# Patient Record
Sex: Female | Born: 1950 | Race: White | Hispanic: No | Marital: Married | Smoking: Former smoker
Health system: Southern US, Community
[De-identification: ages and names within clinical notes are randomized; demographics above are authoritative.]

## PROBLEM LIST (undated history)

## (undated) DIAGNOSIS — K222 Esophageal obstruction: Secondary | ICD-10-CM

## (undated) DIAGNOSIS — I4891 Unspecified atrial fibrillation: Secondary | ICD-10-CM

## (undated) DIAGNOSIS — I2510 Atherosclerotic heart disease of native coronary artery without angina pectoris: Secondary | ICD-10-CM

## (undated) DIAGNOSIS — J45909 Unspecified asthma, uncomplicated: Secondary | ICD-10-CM

## (undated) DIAGNOSIS — F419 Anxiety disorder, unspecified: Secondary | ICD-10-CM

## (undated) DIAGNOSIS — I350 Nonrheumatic aortic (valve) stenosis: Secondary | ICD-10-CM

## (undated) DIAGNOSIS — I5032 Chronic diastolic (congestive) heart failure: Secondary | ICD-10-CM

## (undated) DIAGNOSIS — K219 Gastro-esophageal reflux disease without esophagitis: Secondary | ICD-10-CM

## (undated) DIAGNOSIS — D3A029 Benign carcinoid tumor of the large intestine, unspecified portion: Secondary | ICD-10-CM

## (undated) DIAGNOSIS — E785 Hyperlipidemia, unspecified: Secondary | ICD-10-CM

## (undated) DIAGNOSIS — I5033 Acute on chronic diastolic (congestive) heart failure: Secondary | ICD-10-CM

## (undated) DIAGNOSIS — N62 Hypertrophy of breast: Secondary | ICD-10-CM

## (undated) DIAGNOSIS — G2581 Restless legs syndrome: Secondary | ICD-10-CM

## (undated) DIAGNOSIS — R600 Localized edema: Secondary | ICD-10-CM

## (undated) DIAGNOSIS — Z9889 Other specified postprocedural states: Secondary | ICD-10-CM

## (undated) HISTORY — PX: CORONARY ARTERY BYPASS GRAFT: SHX141

## (undated) HISTORY — DX: Anxiety disorder, unspecified: F41.9

## (undated) HISTORY — DX: Nonrheumatic aortic (valve) stenosis: I35.0

## (undated) HISTORY — DX: Atherosclerotic heart disease of native coronary artery without angina pectoris: I25.10

## (undated) HISTORY — DX: Hypertrophy of breast: N62

## (undated) HISTORY — DX: Hyperlipidemia, unspecified: E78.5

## (undated) HISTORY — DX: Benign carcinoid tumor of the large intestine, unspecified portion: D3A.029

## (undated) HISTORY — DX: Esophageal obstruction: K22.2

## (undated) HISTORY — PX: HERNIA REPAIR: SHX51

## (undated) HISTORY — DX: Gastro-esophageal reflux disease without esophagitis: K21.9

## (undated) HISTORY — DX: Other specified postprocedural states: Z98.890

## (undated) HISTORY — PX: ABDOMINAL HYSTERECTOMY: SHX81

## (undated) HISTORY — DX: Chronic diastolic (congestive) heart failure: I50.32

## (undated) HISTORY — DX: Restless legs syndrome: G25.81

## (undated) HISTORY — PX: OTHER SURGICAL HISTORY: SHX169

## (undated) HISTORY — PX: BACK SURGERY: SHX140

## (undated) HISTORY — DX: Unspecified atrial fibrillation: I48.91

## (undated) HISTORY — PX: BREAST REDUCTION SURGERY: SHX8

## (undated) HISTORY — PX: FOOT SURGERY: SHX648

---

## 1999-03-02 ENCOUNTER — Encounter: Payer: Self-pay | Admitting: Neurosurgery

## 1999-03-02 ENCOUNTER — Ambulatory Visit (HOSPITAL_COMMUNITY): Admission: RE | Admit: 1999-03-02 | Discharge: 1999-03-02 | Payer: Self-pay | Admitting: Neurosurgery

## 2001-07-02 ENCOUNTER — Ambulatory Visit (HOSPITAL_COMMUNITY): Admission: RE | Admit: 2001-07-02 | Discharge: 2001-07-02 | Payer: Self-pay | Admitting: Internal Medicine

## 2001-07-08 ENCOUNTER — Ambulatory Visit (HOSPITAL_COMMUNITY): Admission: RE | Admit: 2001-07-08 | Discharge: 2001-07-08 | Payer: Self-pay | Admitting: Neurosurgery

## 2001-07-08 ENCOUNTER — Encounter: Payer: Self-pay | Admitting: Neurosurgery

## 2001-10-20 DIAGNOSIS — Z9889 Other specified postprocedural states: Secondary | ICD-10-CM

## 2001-10-20 HISTORY — DX: Other specified postprocedural states: Z98.890

## 2002-04-18 ENCOUNTER — Ambulatory Visit (HOSPITAL_COMMUNITY): Admission: RE | Admit: 2002-04-18 | Discharge: 2002-04-18 | Payer: Self-pay | Admitting: Internal Medicine

## 2004-10-07 ENCOUNTER — Ambulatory Visit: Payer: Self-pay | Admitting: Internal Medicine

## 2005-10-20 DIAGNOSIS — D3A029 Benign carcinoid tumor of the large intestine, unspecified portion: Secondary | ICD-10-CM

## 2005-10-20 HISTORY — PX: AORTIC VALVE REPLACEMENT: SHX41

## 2005-10-20 HISTORY — PX: APPENDECTOMY: SHX54

## 2005-10-20 HISTORY — DX: Benign carcinoid tumor of the large intestine, unspecified portion: D3A.029

## 2005-10-20 HISTORY — PX: LAPAROTOMY: SHX154

## 2005-11-20 ENCOUNTER — Ambulatory Visit: Payer: Self-pay | Admitting: Internal Medicine

## 2006-01-18 HISTORY — PX: COLON SURGERY: SHX602

## 2006-04-23 ENCOUNTER — Ambulatory Visit: Payer: Self-pay | Admitting: Cardiology

## 2006-05-20 ENCOUNTER — Ambulatory Visit: Payer: Self-pay | Admitting: Cardiology

## 2006-05-29 ENCOUNTER — Ambulatory Visit: Payer: Self-pay | Admitting: Cardiology

## 2006-05-29 ENCOUNTER — Inpatient Hospital Stay (HOSPITAL_BASED_OUTPATIENT_CLINIC_OR_DEPARTMENT_OTHER): Admission: RE | Admit: 2006-05-29 | Discharge: 2006-05-29 | Payer: Self-pay | Admitting: Cardiology

## 2006-06-12 ENCOUNTER — Ambulatory Visit: Payer: Self-pay | Admitting: Cardiology

## 2006-06-26 ENCOUNTER — Encounter: Admission: RE | Admit: 2006-06-26 | Discharge: 2006-06-26 | Payer: Self-pay | Admitting: Surgery

## 2006-06-29 ENCOUNTER — Encounter: Admission: RE | Admit: 2006-06-29 | Discharge: 2006-06-29 | Payer: Self-pay | Admitting: Dentistry

## 2006-06-29 ENCOUNTER — Ambulatory Visit: Payer: Self-pay | Admitting: Dentistry

## 2006-07-02 ENCOUNTER — Ambulatory Visit (HOSPITAL_COMMUNITY): Admission: RE | Admit: 2006-07-02 | Discharge: 2006-07-02 | Payer: Self-pay | Admitting: Dentistry

## 2006-07-10 ENCOUNTER — Ambulatory Visit: Payer: Self-pay | Admitting: Dentistry

## 2006-07-13 ENCOUNTER — Inpatient Hospital Stay (HOSPITAL_COMMUNITY): Admission: RE | Admit: 2006-07-13 | Discharge: 2006-07-19 | Payer: Self-pay | Admitting: Surgery

## 2006-07-13 ENCOUNTER — Encounter (INDEPENDENT_AMBULATORY_CARE_PROVIDER_SITE_OTHER): Payer: Self-pay | Admitting: Specialist

## 2006-07-21 ENCOUNTER — Ambulatory Visit: Payer: Self-pay | Admitting: Cardiology

## 2006-07-27 ENCOUNTER — Ambulatory Visit: Payer: Self-pay | Admitting: Cardiology

## 2006-08-03 ENCOUNTER — Ambulatory Visit: Payer: Self-pay | Admitting: Cardiology

## 2006-08-12 ENCOUNTER — Ambulatory Visit: Payer: Self-pay | Admitting: Cardiology

## 2006-08-26 ENCOUNTER — Ambulatory Visit: Payer: Self-pay | Admitting: Cardiology

## 2006-08-28 ENCOUNTER — Ambulatory Visit: Payer: Self-pay | Admitting: Cardiology

## 2006-09-04 ENCOUNTER — Ambulatory Visit: Payer: Self-pay | Admitting: Cardiology

## 2006-09-07 ENCOUNTER — Ambulatory Visit: Payer: Self-pay | Admitting: Cardiology

## 2006-09-11 ENCOUNTER — Ambulatory Visit: Payer: Self-pay | Admitting: Cardiology

## 2006-09-21 ENCOUNTER — Ambulatory Visit: Payer: Self-pay | Admitting: Cardiology

## 2006-09-28 ENCOUNTER — Ambulatory Visit: Payer: Self-pay | Admitting: Cardiology

## 2006-10-19 ENCOUNTER — Ambulatory Visit: Payer: Self-pay | Admitting: Cardiology

## 2006-10-29 ENCOUNTER — Ambulatory Visit: Payer: Self-pay | Admitting: Cardiology

## 2006-11-04 ENCOUNTER — Ambulatory Visit: Payer: Self-pay | Admitting: Cardiology

## 2006-11-13 ENCOUNTER — Ambulatory Visit: Payer: Self-pay | Admitting: Cardiology

## 2006-12-02 ENCOUNTER — Ambulatory Visit: Payer: Self-pay | Admitting: Cardiology

## 2006-12-18 ENCOUNTER — Ambulatory Visit: Payer: Self-pay | Admitting: Cardiology

## 2006-12-30 ENCOUNTER — Ambulatory Visit: Payer: Self-pay | Admitting: Cardiology

## 2006-12-31 ENCOUNTER — Ambulatory Visit: Payer: Self-pay | Admitting: Cardiology

## 2007-01-14 ENCOUNTER — Ambulatory Visit: Payer: Self-pay | Admitting: Cardiology

## 2007-01-27 ENCOUNTER — Ambulatory Visit: Payer: Self-pay | Admitting: Cardiology

## 2007-02-10 ENCOUNTER — Ambulatory Visit: Payer: Self-pay | Admitting: Cardiology

## 2007-02-24 ENCOUNTER — Ambulatory Visit: Payer: Self-pay | Admitting: Cardiology

## 2007-03-11 ENCOUNTER — Ambulatory Visit: Payer: Self-pay | Admitting: Physician Assistant

## 2007-03-30 ENCOUNTER — Ambulatory Visit: Payer: Self-pay | Admitting: Cardiology

## 2007-04-12 ENCOUNTER — Ambulatory Visit: Payer: Self-pay | Admitting: Physician Assistant

## 2007-05-11 ENCOUNTER — Ambulatory Visit: Payer: Self-pay | Admitting: Cardiology

## 2007-06-08 ENCOUNTER — Ambulatory Visit: Payer: Self-pay | Admitting: Cardiology

## 2007-06-22 ENCOUNTER — Ambulatory Visit: Payer: Self-pay | Admitting: Cardiology

## 2007-07-20 ENCOUNTER — Ambulatory Visit: Payer: Self-pay | Admitting: Cardiology

## 2007-08-20 ENCOUNTER — Ambulatory Visit: Payer: Self-pay | Admitting: Cardiology

## 2007-09-14 ENCOUNTER — Ambulatory Visit: Payer: Self-pay | Admitting: Cardiology

## 2007-10-20 ENCOUNTER — Ambulatory Visit: Payer: Self-pay | Admitting: Cardiology

## 2007-11-03 ENCOUNTER — Ambulatory Visit: Payer: Self-pay | Admitting: Cardiology

## 2007-11-23 ENCOUNTER — Ambulatory Visit: Payer: Self-pay | Admitting: Cardiology

## 2007-12-15 ENCOUNTER — Ambulatory Visit: Payer: Self-pay | Admitting: Internal Medicine

## 2007-12-29 ENCOUNTER — Ambulatory Visit: Payer: Self-pay | Admitting: Cardiology

## 2008-01-26 ENCOUNTER — Ambulatory Visit: Payer: Self-pay | Admitting: Cardiology

## 2008-02-24 ENCOUNTER — Ambulatory Visit: Payer: Self-pay | Admitting: Cardiology

## 2008-02-29 ENCOUNTER — Ambulatory Visit: Payer: Self-pay | Admitting: Cardiology

## 2008-03-23 ENCOUNTER — Ambulatory Visit: Payer: Self-pay | Admitting: Cardiology

## 2008-04-14 ENCOUNTER — Ambulatory Visit: Payer: Self-pay | Admitting: Cardiology

## 2008-05-09 ENCOUNTER — Ambulatory Visit: Payer: Self-pay | Admitting: Cardiology

## 2008-06-08 ENCOUNTER — Ambulatory Visit: Payer: Self-pay | Admitting: Cardiology

## 2008-06-16 ENCOUNTER — Encounter (INDEPENDENT_AMBULATORY_CARE_PROVIDER_SITE_OTHER): Payer: Self-pay | Admitting: Specialist

## 2008-06-16 ENCOUNTER — Ambulatory Visit (HOSPITAL_COMMUNITY): Admission: RE | Admit: 2008-06-16 | Discharge: 2008-06-17 | Payer: Self-pay | Admitting: Specialist

## 2008-06-23 ENCOUNTER — Ambulatory Visit: Payer: Self-pay | Admitting: Cardiology

## 2008-06-30 ENCOUNTER — Ambulatory Visit: Payer: Self-pay | Admitting: Cardiology

## 2008-07-18 ENCOUNTER — Ambulatory Visit: Payer: Self-pay | Admitting: Cardiology

## 2008-08-08 ENCOUNTER — Ambulatory Visit: Payer: Self-pay

## 2008-09-05 ENCOUNTER — Ambulatory Visit: Payer: Self-pay | Admitting: Cardiology

## 2008-09-26 ENCOUNTER — Ambulatory Visit: Payer: Self-pay | Admitting: Cardiology

## 2008-10-17 ENCOUNTER — Ambulatory Visit: Payer: Self-pay | Admitting: Cardiology

## 2008-12-01 ENCOUNTER — Ambulatory Visit: Payer: Self-pay | Admitting: Cardiology

## 2008-12-26 ENCOUNTER — Ambulatory Visit: Payer: Self-pay | Admitting: Cardiology

## 2009-01-02 ENCOUNTER — Ambulatory Visit: Payer: Self-pay | Admitting: Cardiology

## 2009-01-30 ENCOUNTER — Ambulatory Visit: Payer: Self-pay | Admitting: Cardiology

## 2009-02-07 ENCOUNTER — Encounter: Payer: Self-pay | Admitting: Cardiology

## 2009-02-09 ENCOUNTER — Encounter: Payer: Self-pay | Admitting: Cardiology

## 2009-02-27 ENCOUNTER — Ambulatory Visit: Payer: Self-pay | Admitting: Cardiology

## 2009-03-20 ENCOUNTER — Ambulatory Visit: Payer: Self-pay

## 2009-04-17 ENCOUNTER — Ambulatory Visit: Payer: Self-pay

## 2009-05-15 ENCOUNTER — Ambulatory Visit: Payer: Self-pay | Admitting: Cardiology

## 2009-06-04 ENCOUNTER — Encounter: Payer: Self-pay | Admitting: *Deleted

## 2009-06-04 DIAGNOSIS — I2510 Atherosclerotic heart disease of native coronary artery without angina pectoris: Secondary | ICD-10-CM | POA: Insufficient documentation

## 2009-06-04 DIAGNOSIS — E785 Hyperlipidemia, unspecified: Secondary | ICD-10-CM | POA: Insufficient documentation

## 2009-06-04 DIAGNOSIS — I482 Chronic atrial fibrillation, unspecified: Secondary | ICD-10-CM | POA: Insufficient documentation

## 2009-06-05 ENCOUNTER — Encounter: Payer: Self-pay | Admitting: Cardiology

## 2009-06-05 ENCOUNTER — Ambulatory Visit: Payer: Self-pay | Admitting: Cardiology

## 2009-06-05 DIAGNOSIS — R071 Chest pain on breathing: Secondary | ICD-10-CM | POA: Insufficient documentation

## 2009-06-05 DIAGNOSIS — Z952 Presence of prosthetic heart valve: Secondary | ICD-10-CM | POA: Insufficient documentation

## 2009-06-07 ENCOUNTER — Ambulatory Visit: Payer: Self-pay | Admitting: Cardiology

## 2009-06-07 ENCOUNTER — Encounter: Payer: Self-pay | Admitting: Cardiology

## 2009-06-11 ENCOUNTER — Telehealth: Payer: Self-pay | Admitting: Cardiology

## 2009-07-03 ENCOUNTER — Ambulatory Visit: Payer: Self-pay | Admitting: Cardiology

## 2009-07-03 LAB — CONVERTED CEMR LAB: POC INR: 3.4

## 2009-07-31 ENCOUNTER — Ambulatory Visit: Payer: Self-pay | Admitting: Cardiology

## 2009-08-28 ENCOUNTER — Ambulatory Visit: Payer: Self-pay | Admitting: Cardiology

## 2009-08-29 ENCOUNTER — Encounter: Payer: Self-pay | Admitting: Cardiology

## 2009-09-11 ENCOUNTER — Ambulatory Visit: Payer: Self-pay | Admitting: Cardiology

## 2009-09-11 LAB — CONVERTED CEMR LAB: POC INR: 2.6

## 2009-10-02 ENCOUNTER — Ambulatory Visit: Payer: Self-pay | Admitting: Cardiology

## 2009-10-02 LAB — CONVERTED CEMR LAB: POC INR: 2.1

## 2009-11-09 ENCOUNTER — Ambulatory Visit: Payer: Self-pay | Admitting: Cardiology

## 2009-11-09 LAB — CONVERTED CEMR LAB: POC INR: 2.6

## 2009-11-27 ENCOUNTER — Ambulatory Visit: Payer: Self-pay | Admitting: Cardiology

## 2009-11-27 LAB — CONVERTED CEMR LAB: POC INR: 2.7

## 2009-12-25 ENCOUNTER — Ambulatory Visit: Payer: Self-pay | Admitting: Cardiology

## 2009-12-25 LAB — CONVERTED CEMR LAB: POC INR: 2.5

## 2010-01-22 ENCOUNTER — Ambulatory Visit: Payer: Self-pay | Admitting: Cardiology

## 2010-01-22 LAB — CONVERTED CEMR LAB: POC INR: 2.6

## 2010-02-26 ENCOUNTER — Ambulatory Visit: Payer: Self-pay | Admitting: Cardiology

## 2010-03-26 ENCOUNTER — Ambulatory Visit: Payer: Self-pay | Admitting: Cardiology

## 2010-04-23 ENCOUNTER — Ambulatory Visit: Payer: Self-pay | Admitting: Cardiology

## 2010-04-23 LAB — CONVERTED CEMR LAB: POC INR: 3.1

## 2010-05-21 ENCOUNTER — Ambulatory Visit: Payer: Self-pay | Admitting: Cardiology

## 2010-06-18 ENCOUNTER — Ambulatory Visit: Payer: Self-pay | Admitting: Cardiology

## 2010-07-23 ENCOUNTER — Ambulatory Visit: Payer: Self-pay | Admitting: Cardiology

## 2010-07-23 LAB — CONVERTED CEMR LAB: POC INR: 2.3

## 2010-08-20 ENCOUNTER — Ambulatory Visit: Payer: Self-pay | Admitting: Cardiology

## 2010-08-20 LAB — CONVERTED CEMR LAB: POC INR: 2.8

## 2010-09-10 ENCOUNTER — Ambulatory Visit: Payer: Self-pay | Admitting: Cardiology

## 2010-09-10 LAB — CONVERTED CEMR LAB: POC INR: 2.6

## 2010-09-16 ENCOUNTER — Ambulatory Visit: Payer: Self-pay | Admitting: Internal Medicine

## 2010-09-16 DIAGNOSIS — K219 Gastro-esophageal reflux disease without esophagitis: Secondary | ICD-10-CM | POA: Insufficient documentation

## 2010-09-16 DIAGNOSIS — R1319 Other dysphagia: Secondary | ICD-10-CM | POA: Insufficient documentation

## 2010-09-23 ENCOUNTER — Telehealth (INDEPENDENT_AMBULATORY_CARE_PROVIDER_SITE_OTHER): Payer: Self-pay | Admitting: *Deleted

## 2010-09-27 ENCOUNTER — Ambulatory Visit: Payer: Self-pay | Admitting: Cardiology

## 2010-10-03 ENCOUNTER — Ambulatory Visit (HOSPITAL_COMMUNITY)
Admission: RE | Admit: 2010-10-03 | Discharge: 2010-10-03 | Payer: Self-pay | Source: Home / Self Care | Attending: Internal Medicine | Admitting: Internal Medicine

## 2010-10-03 HISTORY — PX: ESOPHAGOGASTRODUODENOSCOPY: SHX1529

## 2010-10-04 DIAGNOSIS — K222 Esophageal obstruction: Secondary | ICD-10-CM | POA: Insufficient documentation

## 2010-10-04 HISTORY — DX: Esophageal obstruction: K22.2

## 2010-10-08 ENCOUNTER — Encounter (INDEPENDENT_AMBULATORY_CARE_PROVIDER_SITE_OTHER): Payer: Self-pay | Admitting: *Deleted

## 2010-10-08 ENCOUNTER — Ambulatory Visit: Payer: Self-pay | Admitting: Cardiology

## 2010-10-08 LAB — CONVERTED CEMR LAB: POC INR: 1.2

## 2010-10-09 ENCOUNTER — Encounter: Payer: Self-pay | Admitting: Internal Medicine

## 2010-10-10 ENCOUNTER — Telehealth (INDEPENDENT_AMBULATORY_CARE_PROVIDER_SITE_OTHER): Payer: Self-pay

## 2010-10-11 ENCOUNTER — Ambulatory Visit: Payer: Self-pay | Admitting: Cardiology

## 2010-10-11 ENCOUNTER — Encounter: Payer: Self-pay | Admitting: Cardiology

## 2010-10-15 ENCOUNTER — Encounter: Payer: Self-pay | Admitting: Cardiology

## 2010-10-15 ENCOUNTER — Ambulatory Visit: Payer: Self-pay | Admitting: Cardiology

## 2010-10-15 ENCOUNTER — Telehealth (INDEPENDENT_AMBULATORY_CARE_PROVIDER_SITE_OTHER): Payer: Self-pay

## 2010-10-15 LAB — CONVERTED CEMR LAB: POC INR: 2.5

## 2010-11-05 ENCOUNTER — Ambulatory Visit: Admission: RE | Admit: 2010-11-05 | Discharge: 2010-11-05 | Payer: Self-pay | Source: Home / Self Care

## 2010-11-19 NOTE — Medication Information (Signed)
Summary: ccr-lr  Anticoagulant Therapy  Managed by: Vashti Hey, RN Referring MD: Lewayne Bunting PCP: Samuel Jester Supervising MD: Andee Lineman MD, Michelle Piper Indication 1: Atrial Fibrillation (ICD-427.31) Indication 2: Aortic Valve Replacement (ICD-V43.3) [facility] [place] Site: [facility] INR POC 2.3  Dietary changes: no    Health status changes: no    Bleeding/hemorrhagic complications: no    Recent/future hospitalizations: no    Any changes in medication regimen? no    Recent/future dental: no  Any missed doses?: no       Is patient compliant with meds? yes       Allergies: 1)  ! * Anesthia 2)  Pcn  Anticoagulation Management History:      The patient is taking warfarin and comes in today for a routine follow up visit.  Negative risk factors for bleeding include an age less than 60 years old.  The bleeding index is 'low risk'.  Negative CHADS2 values include Age > 64 years old.  The start date was 06/20/2006.  Anticoagulation responsible provider: Andee Lineman MD, Michelle Piper.  INR POC: 2.3.  Cuvette Lot#: [id number].  Exp: 10/11.    Anticoagulation Management Assessment/Plan:      The patient's current anticoagulation dose is Warfarin sodium 2 mg tabs: Use as directed by Anticoagualtion Clinic.  The target INR is 2 - 3.  The next INR is due 07/23/2010.  Anticoagulation instructions were given to patient.  Results were reviewed/authorized by Vashti Hey, RN.  She was notified by Vashti Hey RN.         Prior Anticoagulation Instructions: INR 3.1 Decrease coumadin to 2mg  once daily except 4mg  on M,W,F  Current Anticoagulation Instructions: INR 2.3 Continue coumadin 2mg  once daily except 4mg  on M,W,F

## 2010-11-19 NOTE — Medication Information (Signed)
Summary: ccr-lr  Anticoagulant Therapy  Managed by: Vashti Hey, RN Referring MD: Lewayne Bunting PCP: Samuel Jester Supervising MD: Andee Lineman MD, Michelle Piper Indication 1: Atrial Fibrillation (ICD-427.31) Indication 2: Aortic Valve Replacement (ICD-V43.3) [facility] [place] Site: [facility] INR POC 2.8  Dietary changes: no    Health status changes: no    Bleeding/hemorrhagic complications: no    Recent/future hospitalizations: no    Any changes in medication regimen? no    Recent/future dental: no  Any missed doses?: no       Is patient compliant with meds? yes       Allergies: 1)  ! * Anesthia 2)  Pcn  Anticoagulation Management History:      The patient is taking warfarin and comes in today for a routine follow up visit.  Negative risk factors for bleeding include an age less than 71 years old.  The bleeding index is 'low risk'.  Negative CHADS2 values include Age > 15 years old.  The start date was 06/20/2006.  Anticoagulation responsible provider: Andee Lineman MD, Michelle Piper.  INR POC: 2.8.  Cuvette Lot#: [id number].  Exp: 10/11.    Anticoagulation Management Assessment/Plan:      The patient's current anticoagulation dose is Warfarin sodium 2 mg tabs: Use as directed by Anticoagualtion Clinic.  The target INR is 2 - 3.  The next INR is due 09/10/2010.  Anticoagulation instructions were given to patient.  Results were reviewed/authorized by Vashti Hey, RN.  She was notified by Vashti Hey RN.         Prior Anticoagulation Instructions: INR 2.3 Continue coumadin 2mg  once daily except 4mg  on M,W,F  Current Anticoagulation Instructions: INR 2.8 Continue coumadin 2mg  once daily except 4mg  on M,W,F

## 2010-11-19 NOTE — Medication Information (Signed)
Summary: ccr-lr  Anticoagulant Therapy  Managed by: Vashti Hey, RN Referring MD: Lewayne Bunting PCP: Vance Hochmuth Jester Supervising MD: Diona Browner MD, Remi Deter Indication 1: Atrial Fibrillation (ICD-427.31) Indication 2: Aortic Valve Replacement (ICD-V43.3) Lab [facility] [place] Site: [facility] INR POC 2.6  Dietary changes: no    Health status changes: no    Bleeding/hemorrhagic complications: no    Recent/future hospitalizations: no    Any changes in medication regimen? no    Recent/future dental: no  Any missed doses?: no       Is patient compliant with meds? yes       Allergies: 1)  ! * Anesthia 2)  Pcn  Anticoagulation Management History:      The patient is taking warfarin and comes in today for a routine follow up visit.  Negative risk factors for bleeding include an age less than 68 years old.  The bleeding index is 'low risk'.  Negative CHADS2 values include Age > 34 years old.  The start date was 06/20/2006.  Anticoagulation responsible provider: Diona Browner MD, Remi Deter.  INR POC: 2.6.  Cuvette Lot#: [id number].  Exp: 10/11.    Anticoagulation Management Assessment/Plan:      The patient's current anticoagulation dose is Warfarin sodium 2 mg tabs: Use as directed by Anticoagualtion Clinic.  The target INR is 2 - 3.  The next INR is due 03/26/2010.  Anticoagulation instructions were given to patient.  Results were reviewed/authorized by Vashti Hey, RN.  She was notified by Vashti Hey RN.         Prior Anticoagulation Instructions: INR 2.6 Continue coumadin 4mg  once daily except 2mg  on M,W,F  Current Anticoagulation Instructions: Same as Prior Instructions.

## 2010-11-19 NOTE — Medication Information (Signed)
Summary: ccr-lr  Anticoagulant Therapy  Managed by: Vashti Hey, RN Referring MD: Lewayne Bunting PCP: Samuel Jester Supervising MD: Antoine Poche MD, Fayrene Fearing Indication 1: Atrial Fibrillation (ICD-427.31) Indication 2: Aortic Valve Replacement (ICD-V43.3) Lab [facility] [place] Site: [facility] INR POC 2.6  Dietary changes: no    Health status changes: no    Bleeding/hemorrhagic complications: no    Recent/future hospitalizations: no    Any changes in medication regimen? no    Recent/future dental: no  Any missed doses?: no       Is patient compliant with meds? yes       Allergies: 1)  ! * Anesthia 2)  Pcn  Anticoagulation Management History:      The patient is taking warfarin and comes in today for a routine follow up visit.  Negative risk factors for bleeding include an age less than 85 years old.  The bleeding index is 'low risk'.  Negative CHADS2 values include Age > 1 years old.  The start date was 06/20/2006.  Anticoagulation responsible provider: Antoine Poche MD, Fayrene Fearing.  INR POC: 2.6.  Cuvette Lot#: [id number].  Exp: 10/11.    Anticoagulation Management Assessment/Plan:      The patient's current anticoagulation dose is Warfarin sodium 2 mg tabs: Use as directed by Antico[facility]ualtion Clinic.  The target INR is 2 - 3.  The next INR is due 02/19/2010.  Anticoagulation instructions were given to patient.  Results were reviewed/authorized by Vashti Hey, RN.  She was notified by Vashti Hey RN.         Prior Anticoagulation Instructions: INR 2.5 Continue coumadin 4mg  once daily except 2mg  on M,W,F  Current Anticoagulation Instructions: INR 2.6 Continue coumadin 4mg  once daily except 2mg  on M,W,F

## 2010-11-19 NOTE — Medication Information (Signed)
Summary: ccr-lr  Anticoagulant Therapy  Managed by: Vashti Hey, RN Referring MD: Lewayne Bunting PCP: Samuel Jester Supervising MD: Myrtis Ser MD, Tinnie Gens Indication 1: Atrial Fibrillation (ICD-427.31) Indication 2: Aortic Valve Replacement (ICD-V43.3) Lab [facility] [place] Site: [facility] INR POC 2.3  Dietary changes: no    Health status changes: yes       Details: sinus infection  Bleeding/hemorrhagic complications: no    Recent/future hospitalizations: no    Any changes in medication regimen? yes       Details: Amoxicillin tid x 7 days  Recent/future dental: no  Any missed doses?: no       Is patient compliant with meds? yes       Allergies: 1)  ! * Anesthia 2)  Pcn  Anticoagulation Management History:      The patient is taking warfarin and comes in today for a routine follow up visit.  Negative risk factors for bleeding include an age less than 62 years old.  The bleeding index is 'low risk'.  Negative CHADS2 values include Age > 41 years old.  The start date was 06/20/2006.  Anticoagulation responsible provider: Myrtis Ser MD, Tinnie Gens.  INR POC: 2.3.  Cuvette Lot#: [id number].  Exp: 10/11.    Anticoagulation Management Assessment/Plan:      The patient's current anticoagulation dose is Warfarin sodium 2 mg tabs: Use as directed by Anticoagualtion Clinic.  The target INR is 2 - 3.  The next INR is due 08/20/2010.  Anticoagulation instructions were given to patient.  Results were reviewed/authorized by Vashti Hey, RN.  She was notified by Vashti Hey RN.         Prior Anticoagulation Instructions: INR 2.3 Continue coumadin 2mg  once daily except 4mg  on M,W,F  Current Anticoagulation Instructions: Same as Prior Instructions.

## 2010-11-19 NOTE — Medication Information (Signed)
Summary: CCR-LR  Anticoagulant Therapy  Managed by: Amy Hey, [name] Referring MD: Lewayne Bunting PCP: Samuel Jester Supervising MD: Antoine Poche MD, Fayrene Fearing Indication 1: Atrial Fibrillation (ICD-427.31) Indication 2: Aortic Valve Replacement (ICD-V43.3) Lab [facility] [place] Site: [facility] INR POC 3.1  Dietary changes: no    Health status changes: no    Bleeding/hemorrhagic complications: no    Recent/future hospitalizations: no    Any changes in medication regimen? no    Recent/future dental: no  Any missed doses?: no       Is patient compliant with meds? yes       Allergies: 1)  ! * Anesthia 2)  Pcn  Anticoagulation Management History:      The patient is taking warfarin and comes in today for a routine follow up visit.  Negative risk factors for bleeding include an age less than 14 years old.  The bleeding index is 'low risk'.  Negative CHADS2 values include Age > 59 years old.  The start date was 06/20/2006.  Anticoagulation responsible provider: Antoine Poche MD, Fayrene Fearing.  INR POC: 3.1.  Cuvette Lot#: [id number].  Exp: 10/11.    Anticoagulation Management Assessment/Plan:      The patient's current anticoagulation dose is Warfarin sodium 2 mg tabs: Use as directed by Anticoagualtion Clinic.  The target INR is 2 - 3.  The next INR is due 05/21/2010.  Anticoagulation instructions were given to patient.  Results were reviewed/authorized by Amy Hey, [name].  She was notified by Amy Hey [name].         Prior Anticoagulation Instructions: INR 2.1 Continue coumadin 4mg  once daily except 2mg  on M,W,F  Current Anticoagulation Instructions: INR 3.1 Hold coumadin tonight then resume 4mg  once daily except 2mg  on M,W,F Increase greens

## 2010-11-19 NOTE — Assessment & Plan Note (Signed)
Summary: 6 MO FU PER FEB REMINDER-SRS      Allergies Added:   Visit Type:  Follow-up Primary Provider:  Samuel Jester  CC:  follow-up visit.  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 Patient denies any chest pain. She has no shortness of breath orthopnea PND. She had an echocardiogram done in August of 2010 demonstrated normal LV function ejection fraction measured at 60-65%. There was slight dilatation of the right ventricle. [employer]'s aortic valve appeared to be functioning normal. The left atrium is mild moderately dilated. Of note is also the patient status post coronary bypass grafting but reports no chest pain. The patient also has a history of atrial fibrillation without recurrence. She denies any palpitations presyncope or syncope.  Clinical Review Panels:  CXR CXR results  Clinical Data: Preadmission respiratory film.   CHEST - 2 VIEW   Comparison: PA and lateral chest 07/16/2006.    Findings: Lungs clear.  No effusion.  Heart size normal.  The   patient  is status post aortic valve replacement.    IMPRESSION:   No acute disease.    Read By:  Charyl Dancer,  M.D. (06/09/2008)    Preventive Screening-Counseling & Management  Alcohol-Tobacco     Smoking Status: quit     Year Quit: 1996  Current Medications (verified): 1)  Alprazolam 1 Mg Tabs (Alprazolam) .Marland Kitchen.. 1 1/2 Once Daily 2)  Crestor 10 Mg Tabs (Rosuvastatin Calcium) .... Take One Tablet By Mouth Daily. 3)  Requip 2 Mg Tabs (Ropinirole Hcl) .... Once Daily 4)  Allegra 180 Mg Tabs (Fexofenadine Hcl) .... Once Daily 5)  Warfarin Sodium 2 Mg Tabs (Warfarin Sodium) .... Use As Directed By Anticoagualtion Clinic 6)  Metoprolol Succinate 25 Mg Xr24h-Tab (Metoprolol Succinate) .... Take One Tablet By Mouth Daily 7)  Ramipril 2.5 Mg Caps (Ramipril) .... Take One Capsule By Mouth Daily 8)   Prevacid 15 Mg Cpdr (Lansoprazole) .... 2 Tabs Once Daily 9)  Multivitamins   Tabs (Multiple Vitamin) .... Once Daily 10)  Vitamin D 400 Unit  Tabs (Cholecalciferol) .... Once Daily 11)  Temazepam 30 Mg Caps (Temazepam) .... At Bedtime 12)  Hydrocodone-Acetaminophen 7.5-650 Mg Tabs (Hydrocodone-Acetaminophen) .Marland Kitchen.. 1 Tab Every 4-6 Hrs As Needed  Allergies (verified): 1)  ! * Anesthia 2)  Pcn  Comments:  Nurse/Medical Assistant: The patient is currently on medications but does not know the name or dosage at this time. Instructed to contact our office with details. Will update medication list at that time.  Past History:  Past Medical History: Last updated: 06/04/2009 HYPERLIPIDEMIA-MIXED (ICD-272.4) CAD, ARTERY BYPASS GRAFT (ICD-414.04) ATRIAL FIBRILLATION (ICD-427.31) Macromastia  Past Surgical History: Last updated: 06/04/2009 CABG -- 9/07 Valve Replacement-Aortic (S/P) -- 9/07  Family History: Last updated: 06/05/2009 noncontributory  Social History: Last updated: 06/04/2009 Full Time Widowed  Tobacco Use - Former.   Risk Factors: Smoking Status: quit (12/25/2009)  Review of Systems  The patient denies fatigue, malaise, fever, weight gain/loss, vision loss, decreased hearing, hoarseness, chest pain, palpitations, shortness of breath, prolonged cough, wheezing, sleep apnea, coughing up blood, abdominal pain, blood in stool, nausea, vomiting, diarrhea, heartburn, incontinence, blood in urine, muscle weakness, joint pain, leg swelling, rash, skin lesions, headache, fainting, dizziness, depression, anxiety, enlarged lymph nodes, easy bruising or bleeding, and environmental allergies.    Vital Signs:  Patient profile:   60 year old female Height:  67 inches Weight:      166 pounds O2 Sat:      100 % Pulse rate:   63 / minute BP sitting:   96 / 66  (left arm) Cuff size:   regular  Vitals Entered By: Carlye Grippe (December 25, 2009 9:15 AM) CC: follow-up  visit   Physical Exam  Additional Exam:  General: Well-developed, well-nourished in no distress head: Normocephalic and atraumatic eyes PERRLA/EOMI intact, conjunctiva and lids normal nose: No deformity or lesions mouth normal dentition, normal posterior pharynx neck: Supple, no JVD.  No masses, thyromegaly or abnormal cervical nodes lungs: Normal breath sounds bilaterally without wheezing.  Normal percussion heart: regular rate and rhythm with normal S1 and normal closing click offS2, no S3 or S4.  PMI is normal.  No pathological murmurs abdomen: Normal bowel sounds, abdomen is soft and nontender without masses, organomegaly or hernias noted.  No hepatosplenomegaly musculoskeletal: Back normal, normal gait muscle strength and tone normal pulsus: Pulse is normal in all 4 extremities Extremities: No peripheral pitting edema neurologic: Alert and oriented x 3 skin: Intact without lesions or rashes cervical nodes: No significant adenopathy psychologic: Normal affect    EKG  Procedure date:  12/25/2009  Findings:      normal sinus rhythm with first-degree AV block. Nonspecific ST-T wave changes.  Impression & Recommendations:  Problem # 1:  AORTIC VALVE REPLACEMENT, HX OF (ICD-V43.3) normal functioning aortic valve prosthesis. Continue Coumadin anticoagulation. Followup echocardiogram in one year.  Problem # 2:  CAD, ARTERY BYPASS GRAFT (ICD-414.04) no recurrent chest pain. Continue risk factor modification. The patient is a lipid-lowering therapy with Crestor. Her updated medication list for this problem includes:    Warfarin Sodium 2 Mg Tabs (Warfarin sodium) ..... Use as directed by anticoagualtion clinic    Metoprolol Succinate 25 Mg Xr24h-tab (Metoprolol succinate) .Marland Kitchen... Take one tablet by mouth daily    Ramipril 2.5 Mg Caps (Ramipril) .Marland Kitchen... Take one capsule by mouth daily  Problem # 3:  ATRIAL FIBRILLATION (ICD-427.31) no recurrence. EKG was reviewed and the patient is  in normal sinus rhythm. Her updated medication list for this problem includes:    Warfarin Sodium 2 Mg Tabs (Warfarin sodium) ..... Use as directed by anticoagualtion clinic    Metoprolol Succinate 25 Mg Xr24h-tab (Metoprolol succinate) .Marland Kitchen... Take one tablet by mouth daily  Orders: EKG w/ Interpretation (93000)  Patient Instructions: 1)  Your physician recommends that you continue on your current medications as directed. Please refer to the Current Medication list given to you today. 2)  Follow up in  1 months

## 2010-11-19 NOTE — Medication Information (Signed)
Summary: ccr-lr  Anticoagulant Therapy  Managed by: Vashti Hey, RN Referring MD: Lewayne Bunting PCP: Samuel Jester Supervising MD: Andee Lineman MD, Michelle Piper Indication 1: Atrial Fibrillation (ICD-427.31) Indication 2: Aortic Valve Replacement (ICD-V43.3) [facility] [place] Site: [facility] INR POC 2.7  Dietary changes: no    Health status changes: no    Bleeding/hemorrhagic complications: no    Recent/future hospitalizations: no    Any changes in medication regimen? no    Recent/future dental: no  Any missed doses?: no       Is patient compliant with meds? yes       Allergies: 1)  ! * Anesthia 2)  Pcn  Anticoagulation Management History:      The patient is taking warfarin and comes in today for a routine follow up visit.  Negative risk factors for bleeding include an age less than 18 years old.  The bleeding index is 'low risk'.  Negative CHADS2 values include Age > 20 years old.  The start date was 06/20/2006.  Anticoagulation responsible provider: Andee Lineman MD, Michelle Piper.  INR POC: 2.7.  Cuvette Lot#: [id number].  Exp: 10/11.    Anticoagulation Management Assessment/Plan:      The patient's current anticoagulation dose is Warfarin sodium 2 mg tabs: Use as directed by Anticoagualtion Clinic.  The target INR is 2 - 3.  The next INR is due 12/25/2009.  Anticoagulation instructions were given to patient.  Results were reviewed/authorized by Vashti Hey, RN.  She was notified by Vashti Hey RN.         Prior Anticoagulation Instructions: INR 2.6 Continue coumadin 4mg  once daily except 2mg  on M,W,F  Current Anticoagulation Instructions: INR 2.7 Continue coumadin 4mg  once daily except 2mg  on M,W,F

## 2010-11-19 NOTE — Medication Information (Signed)
Summary: ccr-lr  Anticoagulant Therapy  Managed by: Vashti Hey, RN Referring MD: Lewayne Bunting PCP: Samuel Jester Supervising MD: Diona Browner MD, Remi Deter Indication 1: Atrial Fibrillation (ICD-427.31) Indication 2: Aortic Valve Replacement (ICD-V43.3) [facility] [place] Site: [facility] INR POC 2.6  Dietary changes: no    Health status changes: no    Bleeding/hemorrhagic complications: no    Recent/future hospitalizations: no    Any changes in medication regimen? no    Recent/future dental: no  Any missed doses?: no       Is patient compliant with meds? yes       Allergies: 1)  ! * Anesthia 2)  Pcn  Anticoagulation Management History:      The patient is taking warfarin and comes in today for a routine follow up visit.  Negative risk factors for bleeding include an age less than 68 years old.  The bleeding index is 'low risk'.  Negative CHADS2 values include Age > 73 years old.  The start date was 06/20/2006.  Anticoagulation responsible provider: Diona Browner MD, Remi Deter.  INR POC: 2.6.  Exp: 10/11.    Anticoagulation Management Assessment/Plan:      The patient's current anticoagulation dose is Warfarin sodium 2 mg tabs: Use as directed by Anticoagualtion Clinic.  The target INR is 2 - 3.  The next INR is due 10/08/2010.  Anticoagulation instructions were given to patient.  Results were reviewed/authorized by Vashti Hey, RN.  She was notified by Vashti Hey RN.         Prior Anticoagulation Instructions: INR 2.8 Continue coumadin 2mg  once daily except 4mg  on M,W,F   Current Anticoagulation Instructions: INR 2.6 Continue coumadin 2mg  once daily except 4mg  on M,W,F  Appended Document: FOOD GETTING STUCK IN HER THROAT/LAW Please call Dr. Margarita Mail office in [place]. She is on chronic coumadin due to valve replacement. Scheduled for scope in the future. I sent a flag regarding lovenox bridging, any recommendations. Can you call their office to get further insight? I  won't be here tomorrow, but will follow-up next week. Thanks  Appended Document: FOOD GETTING STUCK IN HER THROAT/LAW LM for Dr. Felisa Bonier nurse- Dondra Spry.  Appended Document: FOOD GETTING STUCK IN HER THROAT/LAW faxed request to Dr.DeGents office

## 2010-11-19 NOTE — Letter (Signed)
Summary: EGD/ED ORDER  EGD/ED ORDER   Imported By: Ave Filter 09/16/2010 10:04:33  _____________________________________________________________________  External Attachment:    Type:   Image     Comment:   External Document  Appended Document: EGD/ED ORDER Pre-op 09/30/10@3 :15.

## 2010-11-19 NOTE — Medication Information (Signed)
Summary: ccr-lr  Anticoagulant Therapy  Managed by: Vashti Hey, RN Referring MD: Lewayne Bunting PCP: Laverda Stribling Jester Supervising MD: Diona Browner MD, Remi Deter Indication 1: Atrial Fibrillation (ICD-427.31) Indication 2: Aortic Valve Replacement (ICD-V43.3) [facility] [place] Site: [facility] INR POC 2.6  Dietary changes: no    Health status changes: no    Bleeding/hemorrhagic complications: no    Recent/future hospitalizations: no    Any changes in medication regimen? no    Recent/future dental: no  Any missed doses?: no       Is patient compliant with meds? yes       Allergies: 1)  ! * Anesthia 2)  Pcn  Anticoagulation Management History:      The patient is taking warfarin and comes in today for a routine follow up visit.  Negative risk factors for bleeding include an age less than 49 years old.  The bleeding index is 'low risk'.  Negative CHADS2 values include Age > 20 years old.  The start date was 06/20/2006.  Anticoagulation responsible provider: Diona Browner MD, Remi Deter.  INR POC: 2.6.  Cuvette Lot#: [id number].  Exp: 10/11.    Anticoagulation Management Assessment/Plan:      The patient's current anticoagulation dose is Warfarin sodium 2 mg tabs: Use as directed by Anticoagualtion Clinic.  The target INR is 2 - 3.  The next INR is due 11/27/2009.  Anticoagulation instructions were given to patient.  Results were reviewed/authorized by Vashti Hey, RN.  She was notified by Vashti Hey RN.         Prior Anticoagulation Instructions: INR 2.1 Continue coumadin 4mg  once daily except 2mg  on M,W,F  Current Anticoagulation Instructions: INR 2.6 Continue coumadin 4mg  once daily except 2mg  on M,W,F

## 2010-11-19 NOTE — Medication Information (Signed)
Summary: ccr-lr  Anticoagulant Therapy  Managed by: Vashti Hey, RN Referring MD: Lewayne Bunting PCP: Samuel Jester Supervising MD: Andee Lineman MD, Michelle Piper Indication 1: Atrial Fibrillation (ICD-427.31) Indication 2: Aortic Valve Replacement (ICD-V43.3) [facility] Site: [facility] INR POC 2.1  Dietary changes: no    Health status changes: no    Bleeding/hemorrhagic complications: no    Recent/future hospitalizations: no    Any changes in medication regimen? no    Recent/future dental: no  Any missed doses?: no       Is patient compliant with meds? yes       Allergies: 1)  ! * Anesthia 2)  Pcn  Anticoagulation Management History:      The patient is taking warfarin and comes in today for a routine follow up visit.  Negative risk factors for bleeding include an age less than 7 years old.  The bleeding index is 'low risk'.  Negative CHADS2 values include Age > 65 years old.  The start date was 06/20/2006.  Anticoagulation responsible provider: Andee Lineman MD, Michelle Piper.  INR POC: 2.1.  Cuvette Lot#: [id number].  Exp: 10/11.    Anticoagulation Management Assessment/Plan:      The patient's current anticoagulation dose is Warfarin sodium 2 mg tabs: Use as directed by Anticoagualtion Clinic.  The target INR is 2 - 3.  The next INR is due 04/23/2010.  Anticoagulation instructions were given to patient.  Results were reviewed/authorized by Vashti Hey, RN.  She was notified by Vashti Hey RN.         Prior Anticoagulation Instructions: INR 2.6 Continue coumadin 4mg  once daily except 2mg  on M,W,F  Current Anticoagulation Instructions: INR 2.1 Continue coumadin 4mg  once daily except 2mg  on M,W,F

## 2010-11-19 NOTE — Medication Information (Signed)
Summary: ccr-lr  Anticoagulant Therapy  Managed by: Vashti Hey, RN Referring MD: Lewayne Bunting PCP: Samuel Jester Supervising MD: Andee Lineman MD, Michelle Piper Indication 1: Atrial Fibrillation (ICD-427.31) Indication 2: Aortic Valve Replacement (ICD-V43.3) [facility] [place] Site: [facility] INR POC 3.1  Dietary changes: no    Health status changes: no    Bleeding/hemorrhagic complications: no    Recent/future hospitalizations: no    Any changes in medication regimen? no    Recent/future dental: no  Any missed doses?: no       Is patient compliant with meds? yes       Allergies: 1)  ! * Anesthia 2)  Pcn  Anticoagulation Management History:      The patient is taking warfarin and comes in today for a routine follow up visit.  Negative risk factors for bleeding include an age less than 58 years old.  The bleeding index is 'low risk'.  Negative CHADS2 values include Age > 65 years old.  The start date was 06/20/2006.  Anticoagulation responsible provider: Andee Lineman MD, Michelle Piper.  INR POC: 3.1.  Cuvette Lot#: [id number].  Exp: 10/11.    Anticoagulation Management Assessment/Plan:      The patient's current anticoagulation dose is Warfarin sodium 2 mg tabs: Use as directed by Anticoagualtion Clinic.  The target INR is 2 - 3.  The next INR is due 06/18/2010.  Anticoagulation instructions were given to patient.  Results were reviewed/authorized by Vashti Hey, RN.  She was notified by Vashti Hey RN.         Prior Anticoagulation Instructions: INR 3.1 Hold coumadin tonight then resume 4mg  once daily except 2mg  on M,W,F Increase greens  Current Anticoagulation Instructions: INR 3.1 Decrease coumadin to 2mg  once daily except 4mg  on M,W,F

## 2010-11-19 NOTE — Medication Information (Signed)
Summary: ccr-lr  Anticoagulant Therapy  Managed by: Vashti Hey, RN Referring MD: Lewayne Bunting PCP: Samuel Jester Supervising MD: Andee Lineman MD, Michelle Piper Indication 1: Atrial Fibrillation (ICD-427.31) Indication 2: Aortic Valve Replacement (ICD-V43.3) Lab [facility] [place] Site: [facility] INR POC 2.5  Dietary changes: no    Health status changes: no    Bleeding/hemorrhagic complications: no    Recent/future hospitalizations: no    Any changes in medication regimen? no    Recent/future dental: no  Any missed doses?: no       Is patient compliant with meds? yes       Allergies: 1)  ! * Anesthia 2)  Pcn  Anticoagulation Management History:      The patient is taking warfarin and comes in today for a routine follow up visit.  Negative risk factors for bleeding include an age less than 59 years old.  The bleeding index is 'low risk'.  Negative CHADS2 values include Age > 59 years old.  The start date was 06/20/2006.  Anticoagulation responsible provider: Andee Lineman MD, Michelle Piper.  INR POC: 2.5.  Cuvette Lot#: [id number].  Exp: 10/11.    Anticoagulation Management Assessment/Plan:      The patient's current anticoagulation dose is Warfarin sodium 2 mg tabs: Use as directed by Anticoagualtion Clinic.  The target INR is 2 - 3.  The next INR is due 01/22/2010.  Anticoagulation instructions were given to patient.  Results were reviewed/authorized by Vashti Hey, RN.  She was notified by Vashti Hey RN.         Prior Anticoagulation Instructions: INR 2.7 Continue coumadin 4mg  once daily except 2mg  on M,W,F  Current Anticoagulation Instructions: INR 2.5 Continue coumadin 4mg  once daily except 2mg  on M,W,F

## 2010-11-21 NOTE — Medication Information (Signed)
Summary: bridge with lovenox  Anticoagulant Therapy  Managed by: Vashti Hey, RN Referring MD: Lewayne Bunting PCP: Samuel Jester Supervising MD: Myrtis Ser MD, Tinnie Gens Indication 1: Atrial Fibrillation (ICD-427.31) Indication 2: Aortic Valve Replacement (ICD-V43.3) Lab [facility] [place] Site: [facility] INR POC 3.6  Dietary changes: no    Health status changes: no    Bleeding/hemorrhagic complications: no    Recent/future hospitalizations: yes       Details: pending EGD with possible dilitation  Any changes in medication regimen? yes       Details: pt to hold coumadin 5 days before procedure on 12/15 and be bridged with Lovenox  Recent/future dental: no  Any missed doses?: no       Is patient compliant with meds? yes       Allergies: 1)  ! * Anesthia 2)  Pcn  Anticoagulation Management History:      The patient is taking warfarin and comes in today for a routine follow up visit.  Negative risk factors for bleeding include an age less than 71 years old.  The bleeding index is 'low risk'.  Negative CHADS2 values include Age > 48 years old.  The start date was 06/20/2006.  Anticoagulation responsible Amandajo Gonder: Myrtis Ser MD, Tinnie Gens.  INR POC: 3.6.  Cuvette Lot#: [id number].  Exp: 10/11.    Anticoagulation Management Assessment/Plan:      The patient's current anticoagulation dose is Warfarin sodium 2 mg tabs: Use as directed by Anticoagualtion Clinic.  The target INR is 2 - 3.  The next INR is due 10/08/2010.  Anticoagulation instructions were given to patient.  Results were reviewed/authorized by Vashti Hey, RN.  She was notified by Vashti Hey RN.         Prior Anticoagulation Instructions: INR 2.6 Continue coumadin 2mg  once daily except 4mg  on M,W,F  Current Anticoagulation Instructions: INR 3.6 EGD with poss. dilitation on 12/15 by Dr Jena Gauss Pt to stop coumadin 5 days before procedure.  She will be bridged with Lovenox 80mg  two times a day per protocol and resume coumadin and  Lovenox night of procedure if OK with Dr Jena Gauss

## 2010-11-21 NOTE — Miscellaneous (Signed)
Summary: med update  Clinical Lists Changes  Medications: Added new medication of ENOXAPARIN SODIUM 80 MG/0.8ML SOLN (ENOXAPARIN SODIUM) Give 1 shot (80mg ) two times a day 12 hours apart

## 2010-11-21 NOTE — Progress Notes (Signed)
Summary: pt wants to change dexilant  Phone Note Call from Patient Call back at Home Phone [phone]   Caller: Patient Summary of Call: pt called- RMR put her on Dexilant after her procedure- [facility] called her last night and informed her that her rx would cost 300.00. pt stated she cant afford that. Pt has already tried nexium, prilosec and prevacid. Pt is on coumadin. She cannot remember if she has tried protonix or not. She would like to try it and have rx sent to [facility] in [place]. Pt requesting rx written in 3 month supplys. Initial call taken by: Hendricks Limes LPN,  October 10, 2010 9:15 AM     Appended Document: pt wants to change dexilant    Prescriptions: PROTONIX 40 MG TBEC (PANTOPRAZOLE SODIUM) take 1 30 min before breakfast daily  #90 x 0   Entered and Authorized by:   Gerrit Halls NP   Signed by:   Gerrit Halls NP on 10/10/2010   Method used:   Faxed to ...       [facility] [employer] [facility] (retail)       [address]       [place], [place]  [address]       Ph: [phone]       Fax: [phone]   RxID:   [id number]

## 2010-11-21 NOTE — Medication Information (Signed)
Summary: CCR-LAST CHECK 12/23-JM  Anticoagulant Therapy  Managed by: Vashti Hey, RN Referring MD: Lewayne Bunting PCP: Samuel Jester Supervising MD: Andee Lineman MD, Michelle Piper Indication 1: Atrial Fibrillation (ICD-427.31) Indication 2: Aortic Valve Replacement (ICD-V43.3) [facility] [place] Site: [facility] INR POC 2.5  Dietary changes: no    Health status changes: no    Bleeding/hemorrhagic complications: no    Recent/future hospitalizations: no    Any changes in medication regimen? no    Recent/future dental: no  Any missed doses?: no       Is patient compliant with meds? yes       Allergies: 1)  ! * Anesthia 2)  Pcn  Anticoagulation Management History:      The patient is taking warfarin and comes in today for a routine follow up visit.  Negative risk factors for bleeding include an age less than 20 years old.  The bleeding index is 'low risk'.  Negative CHADS2 values include Age > 25 years old.  The start date was 06/20/2006.  Anticoagulation responsible provider: Andee Lineman MD, Michelle Piper.  INR POC: 2.5.  Cuvette Lot#: [id number].  Exp: 10/11.    Anticoagulation Management Assessment/Plan:      The patient's current anticoagulation dose is Warfarin sodium 2 mg tabs: Use as directed by Anticoagualtion Clinic.  The target INR is 2 - 3.  The next INR is due 11/05/2010.  Anticoagulation instructions were given to patient.  Results were reviewed/authorized by Vashti Hey, RN.  She was notified by Vashti Hey RN.         Prior Anticoagulation Instructions: INR 1.2 Take coumadin 5mg  tonight and tomorrow night, 4mg  Thursday night and recheck INR on 10/11/10 Continue lovenox 80mg  subcutaneously two times a day   Current Anticoagulation Instructions: INR 2.5 Continue coumadin 2mg  once daily except 4mg  on M,W,F

## 2010-11-21 NOTE — Progress Notes (Signed)
Summary: Protonix not helping  Phone Note Call from Patient   Caller: Patient Summary of Call: Pt called and said the Protonix is not helping much. She is still having burning and heartburn everyday.  Please advise! Initial call taken by: Cloria Spring LPN,  October 15, 2010 3:36 PM     Appended Document: Protonix not helping noted; stop protonix; try Aciphex 20 mg daily x 14 days; spls or RX ok ; no refills until we see how its working  Appended Document: Protonix not helping Pt informed. Rx called to Arlys John at [facility] in [place].   Appended Document: Protonix not helping Pt could not afford the Aciphex and we do not have samples. (It was going to cost her $270.00 ) Please advise!   Appended Document: Protonix not helping i have written a prescription x 14 days; let give her one of those coupons  Appended Document: Protonix not helping Called pharmacist,. 14 tablets will still be $143.00. The coupon says $55.00 savings.  Called pt to see if she would spend that much. LMOM to call.  Appended Document: Protonix not helping Informed pt of the above. She said she still cannot afford the medication.   Appended Document: Protonix not helping Found coupon for #14 free Aciphex. Written Rx and coupon at front desk for pick-up. Pt aware.

## 2010-11-21 NOTE — Medication Information (Signed)
Summary: [facility]  Anticoagulant Therapy  Managed by: Cyril Loosen, RN Referring MD: Lewayne Bunting PCP: Samuel Jester Supervising MD: Andee Lineman MD, Michelle Piper Indication 1: Atrial Fibrillation (ICD-427.31) Indication 2: Aortic Valve Replacement (ICD-V43.3) [facility] [place] Site: [facility] INR POC 2.8  Dietary changes: no    Health status changes: no    Bleeding/hemorrhagic complications: no    Recent/future hospitalizations: no    Any changes in medication regimen? yes       Details: Is on coumadin and Lovenox  Recent/future dental: no  Any missed doses?: no       Is patient compliant with meds? yes       Allergies: 1)  ! * Anesthia 2)  Pcn  Anticoagulation Management History:      The patient is taking warfarin and comes in today for a routine follow up visit.  Negative risk factors for bleeding include an age less than 6 years old.  The bleeding index is 'low risk'.  Negative CHADS2 values include Age > 75 years old.  The start date was 06/20/2006.  Anticoagulation responsible Tymeer Vaquera: Andee Lineman MD, Michelle Piper.  INR POC: 2.8.  Exp: 10/11.    Anticoagulation Management Assessment/Plan:      The patient's current anticoagulation dose is Warfarin sodium 2 mg tabs: Use as directed by Anticoagualtion Clinic.  The target INR is 2 - 3.  The next INR is due 10/15/2010.  Anticoagulation instructions were given to patient.  Results were reviewed/authorized by Cyril Loosen, RN.  She was notified by Cyril Loosen, RN.         Prior Anticoagulation Instructions: INR 1.2 Take coumadin 5mg  tonight and tomorrow night, 4mg  Thursday night and recheck INR on 10/11/10 Continue lovenox 80mg  subcutaneously two times a day   Current Anticoagulation Instructions: INR 2.8 Resume coumadin 2mg  once daily except 4mg  on M,W,F Stop Lovenox injections  Recheck INR on 10/15/10

## 2010-11-21 NOTE — Letter (Signed)
Summary: Patient Notice, Endo Biopsy Results  [facility]  [address]   [place], [place] [address]   Phone: [phone]  Fax: [phone]       October 09, 2010   [name] [address] [place], [place]  [address] [birth year]    Dear [name],  I am pleased to inform you that the biopsies taken during your recent endoscopic examination did not show any evidence of cancer upon pathologic examination.  There was mild inflammation.  Additional information/recommendations:  Please call [phone] to schedule a return visit to review your condition.  Continue with the treatment plan as outlined on the day of your exam.  Please call us if you are having persistent problems or have questions about your condition that have not been fully answered at this time.  Sincerely,    R. Roetta Sessions MD, FACP [facility] Ph: [phone]   Fax: [phone]   Appended Document: Patient Notice, Endo Biopsy Results letter mailed to pt  Appended Document: Patient Notice, Endo Biopsy Results reminder in computer

## 2010-11-21 NOTE — Progress Notes (Signed)
Summary: pending procedure for 12/15   Phone Note Other Incoming Call back at [phone]   Caller: Raynelle Fanning with [facility]  Summary of Call: Patient scheduled to have EGD with possible dilation on 12/15.  Want to know if okay to hold coumadin x 5 days prior to procedure and does she need lovenox bridging?  Sending to you since GD out of office this week.   Hoover Brunette, LPN  September 23, 2010 3:09 PM   I do not recommend holding Coumadin without Lovenox bridging.   Initial call taken by: Nelida Meuse, PA-C,  September 23, 2010 3:45 PM  Follow-up for Phone Call        Please see note regarding Lovenox bridging.  Thanks.  Hoover Brunette, LPN  September 23, 2010 3:58 PM   Additional Follow-up for Phone Call Additional follow up Details #1::        Spoke with Benedetto Goad and Melonie Florida that we will bridge pt with Lovenox prior to procedure.  Spoke with pts husband.  He will have pt call me when she gets home.  Pt called back and appt made for 09/27/10 to give her lovenox instructions. Additional Follow-up by: Vashti Hey RN,  September 26, 2010 2:06 PM

## 2010-11-21 NOTE — Medication Information (Signed)
Summary: ccr-lr  Anticoagulant Therapy  Managed by: Vashti Hey, RN Referring MD: Lewayne Bunting PCP: Samuel Jester Supervising MD: Andee Lineman MD, Michelle Piper Indication 1: Atrial Fibrillation (ICD-427.31) Indication 2: Aortic Valve Replacement (ICD-V43.3) [facility] [place] Site: [facility] INR POC 3.9  Dietary changes: no    Health status changes: no    Bleeding/hemorrhagic complications: no    Recent/future hospitalizations: no    Any changes in medication regimen? no    Recent/future dental: no  Any missed doses?: no       Is patient compliant with meds? yes       Allergies: 1)  ! * Anesthia 2)  Pcn  Anticoagulation Management History:      The patient is taking warfarin and comes in today for a routine follow up visit.  Negative risk factors for bleeding include an age less than 46 years old.  The bleeding index is 'low risk'.  Negative CHADS2 values include Age > 45 years old.  The start date was 06/20/2006.  Anticoagulation responsible provider: Andee Lineman MD, Michelle Piper.  INR POC: 3.9.  Cuvette Lot#: [id number].  Exp: 10/11.    Anticoagulation Management Assessment/Plan:      The patient's current anticoagulation dose is Warfarin sodium 2 mg tabs: Use as directed by Anticoagualtion Clinic.  The target INR is 2 - 3.  The next INR is due 11/26/2010.  Anticoagulation instructions were given to patient.  Results were reviewed/authorized by Vashti Hey, RN.  She was notified by Vashti Hey RN.         Prior Anticoagulation Instructions: INR 2.5 Continue coumadin 2mg  once daily except 4mg  on M,W,F  Current Anticoagulation Instructions: INR 3.9 Been on Levaquin from Dr Charm Barges Hold coumadin tonight then resume 2mg  once daily except 4mg  on M,W,F

## 2010-11-21 NOTE — Medication Information (Signed)
Summary: ccr-lr  Anticoagulant Therapy  Managed by: Vashti Hey, RN Referring MD: Lewayne Bunting PCP: Samuel Jester Supervising MD: Andee Lineman MD, Michelle Piper Indication 1: Atrial Fibrillation (ICD-427.31) Indication 2: Aortic Valve Replacement (ICD-V43.3) Lab [facility] [place] Site: [place] INR POC 1.2  Dietary changes: no    Health status changes: no    Bleeding/hemorrhagic complications: no    Recent/future hospitalizations: yes       Details: Had throat stretched on 10/03/10  Any changes in medication regimen? yes       Details: has been on lovenox and coumadin  Recent/future dental: no  Any missed doses?: no       Is patient compliant with meds? yes       Allergies: 1)  ! * Anesthia 2)  Pcn  Anticoagulation Management History:      The patient is taking warfarin and comes in today for a routine follow up visit.  Negative risk factors for bleeding include an age less than 45 years old.  The bleeding index is 'low risk'.  Negative CHADS2 values include Age > 57 years old.  The start date was 06/20/2006.  Anticoagulation responsible provider: Andee Lineman MD, Michelle Piper.  INR POC: 1.2.  Cuvette Lot#: [id number].  Exp: 10/11.    Anticoagulation Management Assessment/Plan:      The patient's current anticoagulation dose is Warfarin sodium 2 mg tabs: Use as directed by Anticoagualtion Clinic.  The target INR is 2 - 3.  The next INR is due 10/11/2010.  Anticoagulation instructions were given to patient.  Results were reviewed/authorized by Vashti Hey, RN.  She was notified by Vashti Hey RN.         Prior Anticoagulation Instructions: INR 3.6 EGD with poss. dilitation on 12/15 by Dr Jena Gauss Pt to stop coumadin 5 days before procedure.  She will be bridged with Lovenox 80mg  two times a day per protocol and resume coumadin and Lovenox night of procedure if OK with Dr Jena Gauss   Current Anticoagulation Instructions: INR 1.2 Take coumadin 5mg  tonight and tomorrow night, 4mg  Thursday night and  recheck INR on 10/11/10 Continue lovenox 80mg  subcutaneously two times a day

## 2010-11-21 NOTE — Assessment & Plan Note (Signed)
Summary: FOOD GETTING STUCK IN HER THROAT/LAW   Visit Type:  Follow-up Visit Primary Care Provider:  Samuel Jester  CC:  food getting stuck in throat.  History of Present Illness: [name] is a pleasant 60 year old female who was last seen in our office in 2009, doing well from a reflux standpoint and controlled on prevacid daily. She does have a hx of EGD/ED in 2002 with findings of a prominent Schatzki's ring requiring dilation, also small hiatal hernia, antral erosions, otherwise normal. She presents today with recurrent dysphagia since early October with meats, salads, potatoes. Described as intermittent, unsure when it will occur. Remains on Prevacid 30mg  daily, reflux controlled. Does admit to returning to carbonated drinks. Denies nausea. Weight is stable at 173. In 2009 178. Does have hx of aortic valve replacement in 2007, on chronic Coumadin.  Current Medications (verified): 1)  Alprazolam 1 Mg Tabs (Alprazolam) .Marland Kitchen.. 1 1/2 Once Daily 2)  Crestor 10 Mg Tabs (Rosuvastatin Calcium) .... Take One Tablet By Mouth Daily. 3)  Requip 2 Mg Tabs (Ropinirole Hcl) .... Once Daily 4)  Warfarin Sodium 2 Mg Tabs (Warfarin Sodium) .... Use As Directed By Anticoagualtion Clinic 5)  Metoprolol Succinate 25 Mg Xr24h-Tab (Metoprolol Succinate) .... Take One Tablet By Mouth Daily 6)  Ramipril 2.5 Mg Caps (Ramipril) .... Take One Capsule By Mouth Daily 7)  Prevacid 15 Mg Cpdr (Lansoprazole) .... 2 Tabs Once Daily 8)  Multivitamins   Tabs (Multiple Vitamin) .... Once Daily 9)  Vitamin D 400 Unit  Tabs (Cholecalciferol) .... Once Daily 10)  Temazepam 30 Mg Caps (Temazepam) .... At Bedtime 11)  Hydrocodone-Acetaminophen 7.5-650 Mg Tabs (Hydrocodone-Acetaminophen) .Marland Kitchen.. 1 Tab Every 4-6 Hrs As Needed 12)  Zyrtec Allergy 10 Mg Tabs (Cetirizine Hcl) .... Once Daily 13)  Flexeril 10 Mg Tabs (Cyclobenzaprine Hcl) .... As Needed 14)  Magnesium 30 Mg Tabs (Magnesium) .... Once Daily 15)  B-12 .... Once  Daily  Allergies (verified): 1)  ! * Anesthia 2)  Pcn  Past History:  Past Medical History: HYPERLIPIDEMIA-MIXED (ICD-272.4) CAD, ARTERY BYPASS GRAFT (ICD-414.04) ATRIAL FIBRILLATION (ICD-427.31) Macromastia Anxiety Disorder GERD RLS EGD/ED 2002: findings of a prominent Schatzki's ring requiring dilation, also small hiatal hernia, antral erosions, otherwise normal. 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 Eyes:  Denies blurring, irritation, and discharge. ENT:  Denies sore throat, hoarseness, and difficulty swallowing. CV:  Denies chest pains and syncope. Resp:  Denies dyspnea at rest, cough, and wheezing. GI:  Complains of difficulty swallowing; denies pain on swallowing, nausea, indigestion/heartburn, change in bowel habits, bloody BM's, and black BMs. GU:  Denies urinary burning and urinary frequency. MS:  Denies joint pain / LOM, joint swelling, and joint stiffness. Derm:  Denies rash, itching, and dry skin. Neuro:  Denies weakness and syncope. Psych:  Denies depression and anxiety. Endo:  Denies cold intolerance and heat intolerance. Heme:  Denies bruising and bleeding. Allergy:  Denies hives and sneezing.  Vital Signs:  Patient profile:   60 year old female Height:      67 inches Weight:  173 pounds BMI:     27.19 Temp:     98.4 degrees F oral Pulse rate:   68 / minute BP sitting:   118 / 80  (left arm) Cuff size:   regular  Vitals Entered By: Hendricks Limes LPN (September 16, 2010 9:04 AM)  Physical Exam  General:  Well developed, well nourished, no acute distress. Head:  Normocephalic and atraumatic. Eyes:  without scleral icterus Mouth:  No deformity or lesions, dentition normal. Lungs:  Clear throughout to auscultation. Heart:  regular rate and rhythm with prosthetic heart sounds appreciated Abdomen:  +BS, mildly obese, non-tender, non-distended, no hepatosplenomegaly Msk:  Symmetrical with no gross deformities. Normal posture. Pulses:  Normal pulses noted. Extremities:  No clubbing, cyanosis, edema or deformities noted. Neurologic:  Alert and  oriented x4;  grossly normal neurologically. Skin:  Intact without significant lesions or rashes. Psych:  Alert and cooperative. Normal mood and affect.  Impression & Recommendations:  Problem # 1:  DYSPHAGIA (ICD-59.7)  60 year old pleasant Caucasian female with hx of EGD/ED in 2002 secondary to dysphagia: found Schatzki's ring, did well s/p dilation. Hx of reflux well-controlled on Prevacid 30 mg daily. Noted recurrent dysphagia since beginning of October, intermittent, difficulty with meats, salads, potatoes. No nausea or abdominal pain. On coumadin secondary to aortic valve replacement in 2007. The risks, benefits, alternatives were discussed with pt in detail. desires to proceed and gave verbal consent.  Will discuss management of Coumadin with Dr. Andee Lineman. EGD/ED in OR with RMR, secondary to polypharmacy (on regular doses of Xanax, hydrocodone, flexeril as needed, temazepam nightly) Continue Prevacid 30 mg daily. Doing well from reflux standpoint  Orders: Est. Patient Level III (60454)  Problem # 2:  GERD (ICD-530.81)  SEe #1.   Orders: Est. Patient Level III ([address])  Appended Document: FOOD GETTING STUCK IN HER THROAT/LAW Please call Dr. Margarita Mail office in [place]. She is on chronic coumadin due to valve replacement. Scheduled for scope in the future. I sent a flag regarding  lovenox bridging, any recommendations. Can you call their office to get further insight? I won't be here tomorrow, but will follow-up next week. Thanks  Appended Document: FOOD GETTING STUCK IN HER THROAT/LAW LM for Dr. Felisa Bonier nurse- Dondra Spry.  Appended Document: FOOD GETTING STUCK IN HER THROAT/LAW faxed request to Dr.DeGents office  Appended Document: FOOD GETTING STUCK IN HER THROAT/LAW Vashti Hey from [facility] called and spoke with Benedetto Goad-  they will take care of pts bridging for procedure.

## 2010-11-26 ENCOUNTER — Encounter: Payer: Self-pay | Admitting: Cardiology

## 2010-11-26 ENCOUNTER — Encounter (INDEPENDENT_AMBULATORY_CARE_PROVIDER_SITE_OTHER): Payer: Medicare Other

## 2010-11-26 DIAGNOSIS — I4891 Unspecified atrial fibrillation: Secondary | ICD-10-CM

## 2010-11-26 DIAGNOSIS — Z7901 Long term (current) use of anticoagulants: Secondary | ICD-10-CM

## 2010-11-26 LAB — CONVERTED CEMR LAB: POC INR: 3.7

## 2010-12-05 NOTE — Medication Information (Signed)
Summary: ccr-lr  Lab Visit  Orders Today:  Anticoagulant Therapy  Managed by: Vashti Hey, RN Referring MD: Lewayne Bunting PCP: Cristle Jared Jester Supervising MD: Diona Browner MD, Remi Deter Indication 1: Atrial Fibrillation (ICD-427.31) Indication 2: Aortic Valve Replacement (ICD-V43.3) Lab [facility] Site: [facility] INR POC 3.7  Dietary changes: no    Health status changes: no    Bleeding/hemorrhagic complications: no    Recent/future hospitalizations: no    Any changes in medication regimen? no    Recent/future dental: no  Any missed doses?: no       Is patient compliant with meds? yes         Anticoagulation Management History:      The patient is taking warfarin and comes in today for a routine follow up visit.  Negative risk factors for bleeding include an age less than 31 years old.  The bleeding index is 'low risk'.  Negative CHADS2 values include Age > 37 years old.  The start date was 06/20/2006.  Anticoagulation responsible provider: Diona Browner MD, Remi Deter.  INR POC: 3.7.  Cuvette Lot#: [id number].  Exp: 10/11.    Anticoagulation Management Assessment/Plan:      The patient's current anticoagulation dose is Warfarin sodium 2 mg tabs: Use as directed by Anticoagualtion Clinic.  The target INR is 2 - 3.  The next INR is due 12/17/2010.  Anticoagulation instructions were given to patient.  Results were reviewed/authorized by Vashti Hey, RN.  She was notified by Vashti Hey RN.         Prior Anticoagulation Instructions: INR 3.9 Been on Levaquin from Dr Charm Barges Hold coumadin tonight then resume 2mg  once daily except 4mg  on M,W,F  Current Anticoagulation Instructions: INR 3.7 Hold coumadin tonight then decrease dose to 2mg  once daily except 4mg  on Mondays and Fridays

## 2010-12-17 ENCOUNTER — Encounter (INDEPENDENT_AMBULATORY_CARE_PROVIDER_SITE_OTHER): Payer: Medicare Other

## 2010-12-17 ENCOUNTER — Encounter (INDEPENDENT_AMBULATORY_CARE_PROVIDER_SITE_OTHER): Payer: Self-pay | Admitting: Pharmacist

## 2010-12-17 ENCOUNTER — Encounter: Payer: Self-pay | Admitting: Cardiology

## 2010-12-17 DIAGNOSIS — Z7901 Long term (current) use of anticoagulants: Secondary | ICD-10-CM

## 2010-12-17 DIAGNOSIS — Z954 Presence of other heart-valve replacement: Secondary | ICD-10-CM

## 2010-12-17 LAB — CONVERTED CEMR LAB: POC INR: 3.4

## 2010-12-19 ENCOUNTER — Telehealth (INDEPENDENT_AMBULATORY_CARE_PROVIDER_SITE_OTHER): Payer: Self-pay | Admitting: *Deleted

## 2010-12-19 ENCOUNTER — Encounter: Payer: Self-pay | Admitting: Urgent Care

## 2010-12-23 ENCOUNTER — Ambulatory Visit: Payer: Self-pay | Admitting: Urgent Care

## 2010-12-26 ENCOUNTER — Encounter (INDEPENDENT_AMBULATORY_CARE_PROVIDER_SITE_OTHER): Payer: Self-pay | Admitting: *Deleted

## 2010-12-26 NOTE — Letter (Signed)
Summary: Clearance Letter  [facility] [address]   [place], [place] [address]   Phone: [phone]  Fax: [phone]    December 17, 2010  Re:     [facility] Address:   [address]     [place], [place]  [address] DOB:     08-07-1951 MRN:     [id number]   [facility]  I have discussed Mrs. Unterreiner upcoming dental procedure with Dr. Andee Lineman.  He does not feel patient needs to be bridged with lovenox or off coumadin for an extended period of time with this procedure.  Patient will be told to hold coumadin the night before procedure and resume coumadin after procedure.  She will need pre and post antibiotics (amoxicillin) if you will prescribe that for her.  Please feel free to contact out office if you have any further questions regarding Mrs. Endicott.  Sincerely,  Dr. Reece Agar. DeGent/Lisa Azucena Kuba RN

## 2010-12-26 NOTE — Medication Information (Signed)
Summary: ccr-lr  Anticoagulant Therapy  Managed by: Vashti Hey, RN Referring MD: Lewayne Bunting PCP:  Jester Supervising MD: Andee Lineman MD, Michelle Piper Indication 1: Atrial Fibrillation (ICD-427.31) Indication 2: Aortic Valve Replacement (ICD-V43.3) Lab Used: [facility] Site: [facility] INR POC 3.4  Dietary changes: no    Health status changes: no    Bleeding/hemorrhagic complications: no    Recent/future hospitalizations: no    Any changes in medication regimen? no    Recent/future dental: yes     Details: Needs to have dental work and crown prep on 12/27/10  Any missed doses?: no       Is patient compliant with meds? yes       Allergies: 1)  ! * Anesthia 2)  Pcn  Anticoagulation Management History:      The patient is taking warfarin and comes in today for a routine follow up visit.  Anticoagulation is being administered due to a mechanical prosthetic heart valve.  Negative risk factors for bleeding include an age less than 11 years old, no history of CVA/TIA, no history of GI bleeding, and absence of serious comorbidities.  The bleeding index is 'low risk'.  Positive CHADS2 values include History of HTN.  Negative CHADS2 values include History of CHF, Age > 45 years old, History of Diabetes, and Prior Stroke/CVA/TIA.  The start date was 06/20/2006.  Plans are to continue warfarin for life.  Anticoagulation responsible provider: Andee Lineman MD, Michelle Piper.  INR POC: 3.4.  Cuvette Lot#: [id number].  Exp: 10/11.    Anticoagulation Management Assessment/Plan:      The patient's current anticoagulation dose is Warfarin sodium 2 mg tabs: Use as directed by Anticoagualtion Clinic.  The target INR is 2 - 3.  The next INR is due 01/14/2011.  Anticoagulation instructions were given to patient.  Results were reviewed/authorized by Vashti Hey, RN.  She was notified by Vashti Hey RN.         Prior Anticoagulation Instructions: INR 3.7 Hold coumadin tonight then decrease dose to 2mg  once daily  except 4mg  on Mondays and Fridays  Current Anticoagulation Instructions: INR 3.4 Hold coumadin tonight then decrease dose to 2mg  once daily except 4mg  on Fridays Pending dental work on 12/26/09 [facility] Fax [phone]   Office [phone] Will discuss need for Abx and Lovenox with Dr Andee Lineman Discussed with Dr Andee Lineman.  Does not need Lovenox.  Pt will hold coumadin the night before procedure and resume night of procedure.  Pt will need Abx pre and post dental procedure. Orders called and faxed to dentist office. [facility]

## 2010-12-26 NOTE — Progress Notes (Signed)
Summary: WOULD LIKE TO KNOW IF PAPERS WERE FAXED TO DENTIST  Medications Added AMOXICILLIN 500 MG CAPS (AMOXICILLIN) Take 4 tablets 1 hour before dental work       Phone Note Call from Patient Call back at [facility] [phone]   Caller: Patient Reason for Call: Talk to Nurse Summary of Call: CALLED TO CHECK ON STATUS OF PAPERS BEING SENT TO HER DENTIST SO THAT SHE CAN HAVE HER TOOTH FIXED.  DENTIST WILL NOT SEND MEDICINE TO DRUG STORE UNTIL THEY RECEIVE Initial call taken by: Claudette Laws,  December 19, 2010 3:17 PM  Follow-up for Phone Call        Needs Rx for amoxicillin prior to dental work.  Amoxicillin 2gm 1 hr prior to dental work per Dr Andee Lineman.  [facility] to [facility]. Follow-up by: Vashti Hey RN,  December 20, 2010 10:37 AM    New/Updated Medications: AMOXICILLIN 500 MG CAPS (AMOXICILLIN) Take 4 tablets 1 hour before dental work Prescriptions: AMOXICILLIN 500 MG CAPS (AMOXICILLIN) Take 4 tablets 1 hour before dental work  #4 x 0   Entered and Authorized by:   Vashti Hey RN   Signed by:   Vashti Hey RN on 12/20/2010   Method used:   Faxed to ...       [facility] (retail)       [address]       [place], [place]  [address]       Ph: [phone]       Fax: [phone]   RxID:   [id number]

## 2010-12-30 LAB — BASIC METABOLIC PANEL
BUN: 10 mg/dL (ref 6–23)
Calcium: 9.4 mg/dL (ref 8.4–10.5)
Creatinine, Ser: 1 mg/dL (ref 0.4–1.2)
GFR calc Af Amer: >60 mL/min (ref >60)

## 2010-12-30 LAB — HEMOGLOBIN AND HEMATOCRIT, BLOOD
HCT: 33.7 % — ABNORMAL LOW (ref 36.0–46.0)
Hemoglobin: 11.4 g/dL — ABNORMAL LOW (ref 12.0–15.0)

## 2010-12-31 NOTE — Letter (Signed)
Summary: Recall Office Visit  [facility]  [address]   [place], [place] [address]   Phone: [phone]  Fax: [phone]      December 26, 2010   [name] [address] [place], [place]  [address] Jan 09, 1951   Dear [name],   According to our records, it is time for you to schedule a follow-up office visit with [name].   At your convenience, please call [phone] to schedule an office visit. If you have any questions, concerns, or feel that this letter is in error, we would appreciate your call.   Sincerely,    Diana Eves  [facility] Ph: [phone]   Fax: [phone]

## 2011-01-08 ENCOUNTER — Encounter: Payer: Self-pay | Admitting: Urgent Care

## 2011-01-11 ENCOUNTER — Encounter: Payer: Self-pay | Admitting: Cardiology

## 2011-01-11 DIAGNOSIS — Z7901 Long term (current) use of anticoagulants: Secondary | ICD-10-CM | POA: Insufficient documentation

## 2011-01-11 DIAGNOSIS — Z954 Presence of other heart-valve replacement: Secondary | ICD-10-CM

## 2011-01-11 DIAGNOSIS — I4891 Unspecified atrial fibrillation: Secondary | ICD-10-CM

## 2011-01-14 ENCOUNTER — Ambulatory Visit (INDEPENDENT_AMBULATORY_CARE_PROVIDER_SITE_OTHER): Payer: Medicare Other | Admitting: *Deleted

## 2011-01-14 DIAGNOSIS — Z954 Presence of other heart-valve replacement: Secondary | ICD-10-CM

## 2011-01-14 DIAGNOSIS — I4891 Unspecified atrial fibrillation: Secondary | ICD-10-CM

## 2011-01-14 DIAGNOSIS — Z7901 Long term (current) use of anticoagulants: Secondary | ICD-10-CM

## 2011-01-20 ENCOUNTER — Encounter: Payer: Self-pay | Admitting: Urgent Care

## 2011-01-21 ENCOUNTER — Ambulatory Visit (INDEPENDENT_AMBULATORY_CARE_PROVIDER_SITE_OTHER): Payer: Medicare Other | Admitting: Urgent Care

## 2011-01-21 ENCOUNTER — Encounter: Payer: Self-pay | Admitting: Urgent Care

## 2011-01-21 VITALS — BP 113/78 | HR 67 | Temp 97.2°F | Ht 69.0 in | Wt 165.0 lb

## 2011-01-21 DIAGNOSIS — R1319 Other dysphagia: Secondary | ICD-10-CM

## 2011-01-21 DIAGNOSIS — K219 Gastro-esophageal reflux disease without esophagitis: Secondary | ICD-10-CM

## 2011-01-21 MED ORDER — PANTOPRAZOLE SODIUM 40 MG PO TBEC: 40.0000 mg | DELAYED_RELEASE_TABLET | Freq: Two times a day (BID) | ORAL | Status: DC

## 2011-01-21 NOTE — Progress Notes (Signed)
Referring Provider: Louie [place], [place] Primary Care Physician:  Alcide Evener, DO  Chief Complaint  Patient presents with  . Follow-up    HPI:  [name] is a 60 y.o. female here for follow up for dysphasia. She is having some pill dysphagia w/ taking 3-4 pills at a time.  Denies any dysphagia or odynophagia w/ other liquids/solids.  Avoiding left side of mouth due to recent dental extraction.  C/o daily breakthrough heartburn & indigestion on pantoprazole q am.  Denies any lower abd pain, nausea, vomiting, diarrhea or constipation.  Appetite good.  Weight stable.  Recent EGD 10/04/10 by Dr. Jena Gauss.  History of recurrent esophageal dysphagia in the setting of known Schatzki's ring that was dilated back in 2002.  Recent EGD showed two tongue salmon-colored epithelium, benign were Barrett's esophagus, short noncritical soft peptic stricture at  GE junction status post dilation, otherwise, normal stomach, D1, and D2. She was unable to affordDexilant 60 mg.    Past Medical History  Diagnosis Date  . Hyperlipidemia   . CAD (coronary artery disease)     ARTERY BYPASS GRAFT  . Atrial fibrillation   . Macromastia   . Anxiety disorder   . GERD (gastroesophageal reflux disease)   . RLS (restless legs syndrome)   . Peptic stricture of esophagus 10/04/2010    At the GE junction on last EGD by Dr. Jena Gauss, benign biopsies  . History of colonoscopy 2003    Dr. Elmer Ramp  . 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 Coronary artery bypass graft   . Appendectomy   . Teeth removal   . Back surgery   . Cesarean section   . Abdominal hysterectomy   . Foot surgery   . Breast cyst removed   . Breast reduction surgery   . Colon surgery 01/2006    Secondary carcinoid  . Laparotomy 2007    small bowel resection secondary to small bowel obstruction  . Aortic valve replacement 2007    Current Outpatient Prescriptions  Medication Sig Dispense Refill  .  ALPRAZolam (XANAX XR) 1 MG 24 hr tablet Take 1 mg by mouth every morning. 1 1/2 ONCE DAILY       . cetirizine (ZYRTEC) 10 MG tablet Take 10 mg by mouth daily.        . cyanocobalamin 100 MCG tablet Take 100 mcg by mouth daily.        . cyclobenzaprine (FLEXERIL) 10 MG tablet Take 10 mg by mouth as needed.        . hydrocodone-acetaminophen (LORCET PLUS) 7.5-650 MG per tablet Take 1 tablet by mouth every 6 (six) hours as needed.        . magnesium 30 MG tablet Take 30 mg by mouth 2 (two) times daily.       . metoprolol succinate (TOPROL-XL) 25 MG 24 hr tablet Take 25 mg by mouth daily.        . multivitamin (THERAGRAN) per tablet Take 1 tablet by mouth daily.        . pantoprazole (PROTONIX) 40 MG tablet Take 1 tablet (40 mg total) by mouth 2 (two) times daily. TAKE 1 30 MIN BEFORE BREAKFAST DAILY  60 tablet  2  . ramipril (ALTACE) 2.5 MG capsule Take 2.5 mg by mouth daily.        Marland Kitchen rOPINIRole (REQUIP) 2 MG tablet Take 2 mg by mouth at bedtime.        . rosuvastatin (  CRESTOR) 10 MG tablet Take 10 mg by mouth daily.       . temazepam (RESTORIL) 30 MG capsule Take 30 mg by mouth at bedtime as needed.        . vitamin D, CHOLECALCIFEROL, 400 UNITS tablet Take 400 Units by mouth daily.        Marland Kitchen warfarin (COUMADIN) 2 MG tablet Take 2 mg by mouth daily. USE AS DIRECTED BY ANTICOAGUALTION CLINIC      . enoxaparin (LOVENOX) 80 MG/0.8ML SOLN Inject into the skin 2 (two) times daily. GIVE 1 SHOT (80MG ) 2 TIMES A DAY 12 HRS APART       . lansoprazole (PREVACID) 15 MG capsule Take 15 mg by mouth daily. 2 TABS ONCE DAILY         Allergies as of 01/21/2011 - Review Complete 01/21/2011  Allergen Reaction Noted  . Penicillins  06/05/2009    Family History  Problem Relation Age of Onset  . Stroke Mother   . Cancer Father     History   Social History  . Marital Status: Married    Spouse Name: N/A    Number of Children: N/A  . Years of Education: N/A   Occupational History  . Not on file.    Social History Main Topics  . Smoking status: Former Games developer  . Smokeless tobacco: Never Used  . Alcohol Use: No  . Drug Use: No  . Sexually Active: Yes -- Female partner(s)    Birth Control/ Protection: None     spouse   Review of Systems: Gen: Denies any fever, chills, sweats, anorexia, fatigue, weakness, malaise, weight loss, and sleep disorder CV: Denies chest pain, angina, palpitations, syncope, orthopnea, PND, peripheral edema, and claudication. Resp: Denies dyspnea at rest, dyspnea with exercise, cough, sputum, wheezing, coughing up blood, and pleurisy. GI: Denies vomiting blood, jaundice, and fecal incontinence.   Denies dysphagia or odynophagia. Derm: Denies rash, itching, dry skin, hives, moles, warts, or unhealing ulcers.  Psych: Denies depression, anxiety, memory loss, suicidal ideation, hallucinations, paranoia, and confusion. Heme: Denies bruising, bleeding, and enlarged lymph nodes.  Physical Exam: BP 113/78  Pulse 67  Temp 97.2 F (36.2 C)  Ht 5\' 9"  (1.753 m)  Wt 165 lb (74.844 kg)  BMI 24.37 kg/m2  SpO2 97% General:   Alert,  Well-developed, well-nourished, pleasant and cooperative in NAD Head:  Normocephalic and atraumatic. Eyes:  Sclera clear, no icterus.   Conjunctiva pink. Mouth:  No deformity or lesions, dentition normal. Neck:  Supple; no masses or thyromegaly. Heart:  Regular rate and rhythm; no murmurs, clicks, rubs,  or gallops. Abdomen:  Soft, nontender and nondistended. No masses, hepatosplenomegaly or hernias noted. Normal bowel sounds, without guarding, and without rebound.   Msk:  Symmetrical without gross deformities. Normal posture. Pulses:  Normal pulses noted. Extremities:  Without clubbing or edema. Neurologic:  Alert and  oriented x4;  grossly normal neurologically. Skin:  Intact without significant lesions or rashes. Cervical Nodes:  No significant cervical adenopathy. Psych:  Alert and cooperative. Normal mood and affect.

## 2011-01-22 ENCOUNTER — Encounter: Payer: Self-pay | Admitting: Urgent Care

## 2011-01-22 NOTE — Patient Instructions (Signed)
Increase protonix to 40 mg twice per day If you still have problems with acid reflux, call if Colonoscopy in 2013

## 2011-01-22 NOTE — Assessment & Plan Note (Signed)
With some breakthrough on Protonix 40 mg daily. Unfortunately she was unable to afford dexilant.  Will increase Protonix to 40 mg twice a day.

## 2011-01-22 NOTE — Assessment & Plan Note (Signed)
Resolved status post recent esophageal dilation. She is doing quite well, but only has problems when she tries to take too many pills at one time. She is instructed to take pills one at a time. Denies any problems with solid foods.

## 2011-02-04 ENCOUNTER — Encounter: Payer: Self-pay | Admitting: Cardiology

## 2011-02-04 ENCOUNTER — Ambulatory Visit (INDEPENDENT_AMBULATORY_CARE_PROVIDER_SITE_OTHER): Payer: Medicare Other | Admitting: Cardiology

## 2011-02-04 ENCOUNTER — Ambulatory Visit (INDEPENDENT_AMBULATORY_CARE_PROVIDER_SITE_OTHER): Payer: Medicare Other | Admitting: *Deleted

## 2011-02-04 VITALS — BP 111/76 | HR 66 | Ht 67.0 in | Wt 161.0 lb

## 2011-02-04 DIAGNOSIS — I359 Nonrheumatic aortic valve disorder, unspecified: Secondary | ICD-10-CM

## 2011-02-04 DIAGNOSIS — I25810 Atherosclerosis of coronary artery bypass graft(s) without angina pectoris: Secondary | ICD-10-CM

## 2011-02-04 DIAGNOSIS — I2510 Atherosclerotic heart disease of native coronary artery without angina pectoris: Secondary | ICD-10-CM

## 2011-02-04 DIAGNOSIS — Z7901 Long term (current) use of anticoagulants: Secondary | ICD-10-CM

## 2011-02-04 DIAGNOSIS — I4891 Unspecified atrial fibrillation: Secondary | ICD-10-CM

## 2011-02-04 DIAGNOSIS — Z954 Presence of other heart-valve replacement: Secondary | ICD-10-CM

## 2011-02-04 DIAGNOSIS — R1319 Other dysphagia: Secondary | ICD-10-CM

## 2011-02-04 NOTE — Patient Instructions (Signed)
Your physician wants you to follow-up in: 6 months. You will receive a reminder letter in the mail one-two months in advance. If you don't receive a letter, please call our office to schedule the follow-up appointment. Your physician recommends that you continue on your current medications as directed. Please refer to the Current Medication list given to you today. 

## 2011-02-04 NOTE — Assessment & Plan Note (Signed)
Patient remained in normal sinus rhythm. Continue current medical therapy continue Coumadin

## 2011-02-04 NOTE — Assessment & Plan Note (Signed)
No recurrent chest pain. No indication for ischemia workup.

## 2011-02-04 NOTE — Progress Notes (Signed)
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 Echocardiogram in August of 2000 and demonstrated normal LV function with an ejection fraction of 60-65%. She has slight dilatation of the right ventricle. She has a normal functioning St. Jude aortic valve. On echocardiogram the left atrium is mild to moderately dilated. The patient is also status post coronary bypass grafting. Procedures were performed in September of 2007. The patient is doing well from a cardiac standpoint. She denies any chest pain or shortness of breath. She has no orthopnea PND chest no palpitations or syncope. She had recent dental work and she was bridged with Lovenox and placed back on Coumadin after procedure. She also took Augmentin for SBE prophylaxis. She reports no fever or chills. The patient reports to me other primary care physician told her that she needs to come off ramipril will although not sure what the exact indication this   Allergies  Allergen Reactions  . Penicillins     REACTION: rash throat swell    Current Outpatient Prescriptions on File Prior to Visit  Medication Sig Dispense Refill  . ALPRAZolam (XANAX XR) 1 MG 24 hr tablet Take 1&1/2 by mouth in morning and 1/2 at bedtime      . cetirizine (ZYRTEC) 10 MG tablet Take 10 mg by mouth daily.        . cyclobenzaprine (FLEXERIL) 10 MG tablet Take 10 mg by mouth as needed.        . metoprolol succinate (TOPROL-XL) 25 MG 24 hr tablet Take 25 mg by mouth daily.        . multivitamin (THERAGRAN) per tablet Take 1 tablet by mouth daily.        . pantoprazole (PROTONIX) 40 MG tablet Take 1 tablet (40 mg total) by mouth 2 (two) times daily. TAKE 1 30 MIN BEFORE BREAKFAST DAILY  60 tablet  2  . ramipril (ALTACE) 2.5 MG capsule Take 2.5 mg by mouth daily.        Marland Kitchen rOPINIRole (REQUIP) 2 MG tablet Take 2 mg by mouth at bedtime.        . rosuvastatin (CRESTOR) 10 MG  tablet Take 10 mg by mouth daily.       . temazepam (RESTORIL) 30 MG capsule Take 30 mg by mouth at bedtime as needed.        . warfarin (COUMADIN) 2 MG tablet Take 2 mg by mouth daily. USE AS DIRECTED BY ANTICOAGUALTION CLINIC      . DISCONTD: cyanocobalamin 100 MCG tablet Take 100 mcg by mouth daily.        Marland Kitchen DISCONTD: enoxaparin (LOVENOX) 80 MG/0.8ML SOLN Inject into the skin 2 (two) times daily. GIVE 1 SHOT (80MG ) 2 TIMES A DAY 12 HRS APART       . DISCONTD: hydrocodone-acetaminophen (LORCET PLUS) 7.5-650 MG per tablet Take 1 tablet by mouth every 6 (six) hours as needed.        Marland Kitchen DISCONTD: lansoprazole (PREVACID) 15 MG capsule Take 15 mg by mouth daily. 2 TABS ONCE DAILY       . DISCONTD: magnesium 30 MG tablet Take 30 mg by mouth 2 (two) times daily.       Marland Kitchen DISCONTD: vitamin D, CHOLECALCIFEROL, 400 UNITS tablet Take 400 Units by mouth daily.          Past Medical History  Diagnosis Date  . Hyperlipidemia   . CAD (coronary artery disease)  ARTERY BYPASS GRAFT  . Atrial fibrillation   . Macromastia   . Anxiety disorder   . GERD (gastroesophageal reflux disease)   . RLS (restless legs syndrome)   . Peptic stricture of esophagus 10/04/2010    At the GE junction on last EGD by Dr. Jena Gauss, benign biopsies  . History of colonoscopy 2003    Dr. Elmer Ramp  . 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 Coronary artery bypass graft   . Appendectomy   . Teeth removal   . Back surgery   . Cesarean section   . Abdominal hysterectomy   . Foot surgery   . Breast cyst removed   . Breast reduction surgery   . Colon surgery 01/2006    Secondary carcinoid  . Laparotomy 2007    small bowel resection secondary to small bowel obstruction  . Aortic valve replacement 2007    Family History  Problem Relation Age of Onset  . Stroke Mother   . Cancer Father     History   Social History  . Marital Status: Married    Spouse Name: N/A    Number of Children:  N/A  . Years of Education: N/A   Occupational History  . Not on file.   Social History Main Topics  . Smoking status: Former Smoker -- 1.0 packs/day for 40 years    Types: Cigarettes    Quit date: 10/20/1988  . Smokeless tobacco: Never Used  . Alcohol Use: No  . Drug Use: No  . Sexually Active: Yes -- Female partner(s)    Birth Control/ Protection: None     spouse   Other Topics Concern  . Not on file   Social History Narrative  . No narrative on file   Review of systems:Pertinent positives as outlined above. The remainder of the 18  point review of systems is negative   PHYSICAL EXAM BP 111/76  Pulse 66  Ht 5\' 7"  (1.702 m)  Wt 161 lb (73.029 kg)  BMI 25.22 kg/m2  General: Well-developed, well-nourished in no distress Head: Normocephalic and atraumatic Eyes:PERRLA/EOMI intact, conjunctiva and lids normal Ears: No deformity or lesions Mouth:normal dentition, normal posterior pharynx Neck: Supple, no JVD.  No masses, thyromegaly or abnormal cervical nodes Lungs: Normal breath sounds bilaterally without wheezing.  Normal percussion Cardiac: regular rate and rhythm with normal S1 and normal mechanical closing click off S2, no S3 or S4.  PMI is normal.  No pathological murmurs Abdomen: Normal bowel sounds, abdomen is soft and nontender without masses, organomegaly or hernias noted.  No hepatosplenomegaly MSK: Back normal, normal gait muscle strength and tone normal Vascular: Pulse is normal in all 4 extremities Extremities: No peripheral pitting edema Neurologic: Alert and oriented x 3 Skin: Intact without lesions or rashes Lymphatics: No significant adenopathyPsychologic: Normal affect  ECG: Normal sinus rhythm nonspecific ST-T wave changes  ASSESSMENT AND PLAN

## 2011-02-04 NOTE — Assessment & Plan Note (Signed)
Normal functioning valve by physical examination. There is a crisp closing click. No need for echocardiogram at the present time.

## 2011-02-04 NOTE — Assessment & Plan Note (Signed)
The patient was appropriately bridged with Lovenox and resume on Coumadin after a dental procedure.

## 2011-02-07 ENCOUNTER — Encounter: Payer: Medicare Other | Admitting: *Deleted

## 2011-03-04 ENCOUNTER — Ambulatory Visit (INDEPENDENT_AMBULATORY_CARE_PROVIDER_SITE_OTHER): Payer: Medicare Other | Admitting: *Deleted

## 2011-03-04 DIAGNOSIS — Z7901 Long term (current) use of anticoagulants: Secondary | ICD-10-CM

## 2011-03-04 DIAGNOSIS — Z954 Presence of other heart-valve replacement: Secondary | ICD-10-CM

## 2011-03-04 DIAGNOSIS — I4891 Unspecified atrial fibrillation: Secondary | ICD-10-CM

## 2011-03-04 LAB — POCT INR: INR: 2.6

## 2011-03-04 NOTE — Assessment & Plan Note (Signed)
NAMEMarland Kitchen  [name], [name]              CHART#:  [id number]   DATE:  12/15/2007                       DOB:  22-Jan-1951   FOLLOWUP:  Gastroesophageal reflux disease.   LAST SEEN:  11/20/2005.  She has done well from a reflux standpoint.  Symptoms continue to be well controlled on Prevacid  30 mg early daily.  She has a history of Schatzki's ring dilated back in 2002, but she  denies any recurrent dysphagia.  She tells me since she was last seen  here, she has had multiple surgeries starting with appendectomy.  There  was a question of a tumor in her small bowel for which she had a  resection by Dr. Cleotis Nipper and subsequently, it sounds like she had a  bowel obstruction secondary to adhesions requiring another procedure.  All in all, she is doing well now.  I note that she had a colonoscopy  back in 2003.  She was found to have some anal hemorrhoids, otherwise a  normal exam.  She is due for routine screen 2013.  She has gained 10  pounds since she was last seen on 11/20/2005.  She has not had any  melena or rectal bleeding.  She has a prosthetic heart valve and takes  warfarin.   CURRENT MEDICATIONS:  See updated list.   ALLERGIES:  Penicillin.   There is no family history of GI malignancy.   PHYSICAL EXAMINATION:  Pleasant 60 year old lady resting comfortably.  Weight 178.  Height 5 feet 4 inches.  Temperature 98.3, blood pressure  110/82. Pulse 68.  SKIN:  Warm and dry.  CHEST:  Lungs are clear to auscultation.  HEART:  Regular rate and rhythm with prosthetic heart sounds.  __________  BREAST EXAM:  Deferred.  ABDOMEN:  Flat, somewhat obese.  Positive bowel sounds, soft, nontender  with out appreciable mass or hepatosplenomegaly.   ASSESSMENT:  Gastroesophageal reflux disease symptoms well controlled on  Prevacid.  I asked her not to gain any more weight.  In fact, if she  could lose 10 to 20 pounds, that would be of great overall benefit to  her health.  Antireflux diet  emphasized.   RECOMMENDATIONS:  No scheduled followup here since Dr. Charm Barges has things  well under control with [name].  I will stand by and see her back  on a p.r.n. basis.  I will put her on  the recall list for 2013 screening colonoscopy.  Of course, if anything  comes up in the interim, I will be glad to see this very nice lady.       Jonathon Bellows, M.D.  Electronically Signed     RMR/MEDQ  D:  12/15/2007  T:  12/15/2007  Job:  [id number]   cc:   Samuel Jester

## 2011-03-04 NOTE — Op Note (Signed)
[name], [name]             ACCOUNT NO.:  [id number]   MEDICAL RECORD NO.:  [id number]          PATIENT TYPE:  OIB   LOCATION:  5124                         FACILITY:  [facility]   PHYSICIAN:  Earvin Hansen L. Truesdale, M.D.DATE OF BIRTH:  1951-05-27   DATE OF PROCEDURE:  06/16/2008  DATE OF DISCHARGE:                               OPERATIVE REPORT   INDICATIONS:  The patient with severe macromastia, back and shoulder  pain secondary to large pendulous breasts.  She does have a history of  heart disease and anomalies.  The patient now being prepared for  bilateral breast reductions.  All the procedures in detail was explained  to the patient as well as the risks and complications.  The patient  agrees and consents for surgery.   PROCEDURES DONE:  Bilateral breast reductions using the inferior pedicle  technique.   ANESTHESIA:  General.   DESCRIPTION OF PROCEDURE:  Preoperatively, the patient was set up and  drawn for the inferior pedicle reduction mammoplasty, re-marked the  nipple-areola complex back up to 22 cm from the suprasternal notch.  She  then underwent general anesthesia and intubated  orally.  Prep was done  to the chest, breast areas in a routine fashion using Betadine soap and  solution, walled off with sterile towels and drapes so as to make a  sterile field.  A 0.25% Xylocaine with epinephrine was injected locally  1:400,000 concentration, a total of 200 mL per side.  The wounds were  scored with #15 blade.  Then, the skin in the inferior pedicle was de-  epithelialized with #20 blade.  The new keyhole area was also debulked,  and  out laterally, more accessory breast tissue was removed to give  more asymmetry and also removed excess tissue causing intertriginous  changes laterally.  The flaps were transposed to stay with 3-0 Monocryl.  Subcutaneous closure was done with 3-0 Monocryl x2 layers, then running  subcuticular stitch with 3-0 Monocryl and 5-0 Monocryl  throughout the  inverted T.  The wound was drained with #10 fully fluted Blake drain,  which was placed in the depths of the wound and brought out through the  lateral most portion of the incisions and secured with 3-0 Prolene.  Same procedure was carried on both side removing over 1000 g both sides.  At the end of the procedure, the nipple-areola complex were examined  showing excellent blood supply and suppleness.  The dressings included  4x4s, Steri-Strips, ABDs, and Hypafix tape.  She withstood the  procedures very well and was taken to recovery room in excellent  condition.      Yaakov Guthrie. Shon Hough, M.D.  Electronically Signed     GLT/MEDQ  D:  06/16/2008  T:  06/17/2008  Job:  [id number]

## 2011-03-04 NOTE — Assessment & Plan Note (Signed)
[facility]                          [facility] NOTE   Amy, [name]                    MRN:          [id number]  DATE:02/29/2008                            DOB:          Mar 07, 1951    CARDIAC ASSESSMENT:  Learta Codding, MD.   PRIMARY CARE PHYSICIAN:  Samuel Jester, MD.   REASON FOR VISIT:  One-year follow-up.   HISTORY OF PRESENT ILLNESS:  [name] is a 60 year old female  patient with a history of a bicuspid aortic valve and severe aortic  stenosis and coronary artery disease, who underwent a two-vessel CABG  and a St. Jude aortic valve replacement/Bentall procedure in September  2007.  She presents to the office today for follow-up.  Overall she is  doing well.  She denies chest pain or shortness of breath.  Denies any  syncope or near-syncope.  Denies orthopnea, PND or pedal edema.  Of  note, she is interested in proceeding with breast reduction surgery at  some point in the near future.   CURRENT MEDICATIONS:  1. Xanax 1 mg 1-1/2 tablets daily.  2. Crestor 5 mg daily.  3. Ropinirole 2 mg daily.  4. Allegra 180 mg daily.  5. Coumadin as directed.  6. Metoprolol 25 mg daily.  7. Ramipril 2.5 mg daily.  8. Prevacid 30 mg daily.  9. Multivitamin daily.  10.Temazepam 30 mg nightly.   PHYSICAL EXAM:  She is a well-nourished, well-developed female in no  acute distress.  Blood pressure is 111/70, pulse 59, weight 178 pounds.  HEENT:  Normal.  NECK:  Without JVD.  CARDIAC:  Normal S1, mechanical S2. regular rate and rhythm.  No murmur.  LUNGS:  Clear to auscultation bilaterally.  ABDOMEN:  Soft, nontender.  EXTREMITIES:  Without edema.  NEUROLOGIC:  She is alert and oriented x3.  Cranial nerves II-XII are  grossly intact.  VASCULAR:  No carotid artery bruits noted bilaterally.   Electrocardiogram:  Normal sinus rhythm with a heart rate of 69,  nonspecific ST-T wave changes.   IMPRESSION:  1. Status post St.  Jude aortic valve replacement/Bentall procedure      September 2007, secondary to severe aortic stenosis from bicuspid      aortic valve.  2. Coronary artery disease, status post coronary artery bypass      grafting September 2007:  Right internal mammary artery to the      right coronary artery and saphenous vein graft to the distal right      coronary artery.  3. History of postoperative atrial fibrillation, treated previously      with amiodarone.  4. Postoperative pericardial effusion, resolved.  5. Treated dyslipidemia.  6. Macromastia.   PLAN:  1. [name] is doing well from a cardiovascular point.  She      requires no further cardiovascular testing at this time.  2. She is considering breast reduction surgery for macromastia.  She      is low to intermediate risk for thromboembolism.  I discussed her      case today with Dr. Andee Lineman.  Should she decide to have  surgery, she      would need bridging Lovenox therapy preoperatively.  However, she      could remain off of anticoagulation for a maximum of 3-4 days      postoperatively.  When it is felt safe to restart anticoagulation,      she should be started back on Coumadin therapy only without      bridging Lovenox therapy.  Our office will certainly be able to      coordinate that plan in the event she decides to have surgery.  3. The patient will be brought back in follow-up in the next 12 months      or sooner p.r.n.  4. Of note, the patient's lipids are managed by Dr. Charm Barges.  Her goal      LDL remains at less than or equal to 70.      Tereso Newcomer, PA-C  Electronically Signed      Learta Codding, MD,FACC  Electronically Signed   SW/MedQ  DD: 02/29/2008  DT: 02/29/2008  Job #: [id number]   cc:   Samuel Jester

## 2011-03-07 NOTE — Assessment & Plan Note (Signed)
[facility]                          [facility] NOTE   LORREN, ROSSETTI                    MRN:          [id number]  DATE:10/29/2006                            DOB:          1950-12-19    HISTORY OF PRESENT ILLNESS:  The patient is a 60 year old female with a  history of bicuspid aortic valve and severe aortic stenosis.  The  patient is status post aortic valve replacement with a #25 St. Jude  mechanical valve conduit and a 30-mm Hemashield tube graft secondary to  aortic stenosis and ascending aortic aneurysm.  The patient has done  well postoperatively.  She denies any substernal chest pain, shortness  of breath, orthopnea, PND.  She is followed in our Coumadin Clinic and  INR levels are maintained between 2 and 3.  In addition the patient also  has a history of GERD and a history of laparotomy, lysis of  adhesions.  The patient also received a 2 vessel bypass grafting.  She continues to  do well.  Her INR level today is 3.6.   MEDICATIONS:  1. Lovastatin 40 mg p.o. nightly.  2. Xanax p.r.n.  3. Prevacid 30 mg daily.  4. Multivitamin.  5. Claritin 10 mg p.o. daily.  6. Requip.  7. Temazepam.  8. Metoprolol ER 25 daily.  9. Amiodarone was discontinued by Gene Serpe during the last clinic      visit.   The patient has a history of postoperative atrial fibrillation but  continues to maintain normal sinus rhythm.   PHYSICAL EXAMINATION:  VITAL SIGNS:  Blood pressure is 132/82 mmHg,  heart rate is 68 beats per minute, weight is 171 pounds.  NECK:  Normal carotid upstroke, no carotid bruits.  LUNGS:  Clear, breath sounds bilaterally.  HEART:  Regular rate and rhythm with normal S1 and a normal closing  click of S2 from the aortic valve replacement.  There is a soft murmur  at the left upper sternal border.  There is no pathological murmurs,  there is no S3 or S4.  ABDOMEN:  Soft.  EXTREMITY:  Reveals no cyanosis, clubbing,  or edema.   PROBLEMS:  1. Status post St. Jude aortic valve replacement with conduit (Bentall      procedure).      a.     History of severe aortic stenosis.      b.     History of bicuspid aortic valve.  2. Postoperative atrial fibrillation resolved, off amiodarone.  3. Coronary artery disease.  Status post 2 vessel coronary bypass      grafting at the time of valve replacement.  4. Normal left ventricular function  5. Dyslipidemia.  6. Gastroesophageal reflux disease.  7. Multiple abdominal surgeries.   PLAN:  1. The patient continues to do well.  She remains in sinus rhythm.  2. We made appropriate adjustments in the patient's Coumadin therapy,      her current INR is 3.6.  3. The patient has no evidence of heart failure symptoms and she can      follow up with [name] electively.  I did  order a 2D echocardiogram to      establish postoperative gradients of the valve conduit.     Learta Codding, MD,FACC  Electronically Signed    GED/MedQ  DD: 10/29/2006  DT: 10/29/2006  Job #: [id number]   cc:   Samuel Jester

## 2011-03-07 NOTE — Assessment & Plan Note (Signed)
[facility]                            [name]   KA, BENCH                    MRN:          [id number]  DATE:05/20/2006                            DOB:          [birth year]    REASON FOR OFFICE VISIT:  [name] is a 60 year old female, recently  referred to [name] in consultation here at [facility] for evaluation of a  severe aortic stenosis by echocardiography.  The patient was hospitalized  with abdominal pain, and ultimately underwent small bowel resection for  treatment of adhesions.  On examination, she was found to have a systolic  murmur and a 2D echocardiogram was ordered.  This was reviewed by Dr.  Andee Lineman, who noted severe aortic stenosis with a peak gradient of 9mmHg/mean  gradient of 45 mmHg; aortic valve area 0.9 sq cm; mild aortic regurgitation  and marked poststenotic dilatation (4.47 cm).  Left ventricular function was  normal (EF 60%).   The patient presented with no prior known history of heart disease, but did  report approximately 2-year history of exertional angina and associated  dyspnea, promptly relieved with rest.  She denied any recent progression or  exacerbation, however.  She also denied any symptoms suggestive of  congestive heart failure, or of syncope.   Following our in-hospital consultation, we recommended that the patient be  set up for an elective right/left cardiac catheterization in 2 weeks  following discharge.  However, she was reluctant to proceed without  returning to our office today to discuss this further.   Since her recent hospitalization, she has been doing extremely well  following her recent abdominal surgery; moreover, she denies any symptoms of  exertional angina pectoris, orthopnea, PND or lower extremity edema.  She  denies any tachy palpitations or presyncope/syncope.  She does continue to  have, however, some mild exertional dyspnea which has not  progressed.   CURRENT MEDICATIONS:  1.  Lovastatin.  2.  Temazepam 15 mg q.h.s.  3.  Hydrocodone as directed.  4.  Xanax 0.5 mg as directed.  5.  Prevacid.  6.  Nortriptyline.  7.  Claritin.   PHYSICAL EXAMINATION:  VITAL SIGNS:  Blood pressure 102/80, pulse 84 and  regular, weight 160.6.  GENERAL:  60 year old female, mildly obese, sitting upright in no apparent  distress.  HEENT:  Normocephalic, atraumatic.  NECK:  Palpable carotid pulses with bilateral supraclavicular bruits, but no  intrinsic carotid bruits.  HEART:  Regular rate and rhythm (S1, S2), a grade 2-3/6  crescendo/decrescendo murmur in the upper LSV with preserved S2; a soft  grade 1/6 diastolic blow.  ABDOMEN:  Protuberant, nontender.  EXTREMITIES:  Palpable distal pulses with no appreciable edema.  NEUROLOGIC:  No focal deficit.   IMPRESSION:  1.  Severe aortic stenosis.      1.  Peak gradient 81 mmHg/mean gradient 45 mmHg; AA 0.9 sq cm with          marked poststenotic dilatation (4.4 cm) by recent echocardiogram.  2.  Normal left ventricular function.  3.  Exertional dyspnea.      1.  Suspect  secondary to #1.  4.  Hyperlipidemia.  5.  GERD.      1.  Status post esophageal dilatation.  6.  Status post recent small bowel resection.      1.  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   PLAN:  At this point in time, [name] is prepared to now proceed with  an elective right/left cardiac catheterization, as we had initially  recommended.  She is not quite yet ready, however, to consider surgical  repair of the aortic valve, if this is in fact indicated.  Nevertheless, she  does understand that it is important to completely assess the severity of  her aortic stenosis, as well as to exclude any significant underlying  coronary artery disease, in the event she will need to undergo surgery in  the near future.  She has therefore agreed to proceed, and we will try to  arrange this within the next few  days in the [facility].  The risks/benefits of  the procedure were discussed, and she is amenable to proceed.                                   Gene Serpe, PA-C                                Learta Codding, MD, [facility]   GS/MedQ  DD:  05/20/2006  DT:  05/21/2006  Job #:  [id number]   cc:   Samuel Jester

## 2011-03-07 NOTE — Assessment & Plan Note (Signed)
[facility]                            [facility] NOTE   [name], [name]                    MRN:          [id number]  DATE:06/12/2006                            DOB:          [birth year]    PRIMARY CARDIOLOGIST:  Learta Codding, MD, [facility].   REASON FOR VISIT:  Post elective cardiac catheterization followup.   [name] is a 60 year old female initially referred to use in  consultation here at [facility] for evaluation of severe aortic  stenosis by echocardiography, after undergoing an exploratory  laparotomy/small bowel resection on April 24, 2006, secondary to obstruction  of the distal jejunum secondary to adhesions.  Of note, the patient had  undergone prior right hemicolectomy, again by Dr. 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  Review of the  echocardiogram, by Dr. Andee Lineman, suggested severe aortic stenosis with a peak  gradient 81-mmHg/mean gradient 45-mmHg, with an aortic valve area of 0.9-  cm2.  There was also mild aortic regurgitation and marked post stenotic  dilatation (4.47-cm).  Left ventricular function was normal (EF 60%).  While the patient was initially reluctant to proceed with a recommended  right/left cardiac catheterization, she subsequently agreed to do so.  This  was scheduled on May 29, 2006, with Dr. Simona Huh, and revealed  coronary artery disease with a 90% complex mid RCA stenosis as well as 50%  proximal LAD stenosis; overall normal left ventricular function (EF 65%)  with no focal wall motion abnormality; 2+ aortic regurgitation; and moderate  aortic stenosis (mean gradient 28-mmHg) with a significant post stenotic  aneurysmal dilatation.  The patient now presents in followup.  She reports no post intervention  complications of her right groin incision site.  She continues to deny any  exertional angina pectoris and has had no presyncopal/syncopal  episodes.  Of  note, she was discharged by Dr. Diona Browner on bisoprolol 2.5 every day.  She  reports today that this has made her feel much better with respect to her  exertional dyspnea.   CURRENT MEDICATIONS:  1. Bisoprolol 2.5 mg every day.  2. Lovastatin 40 mg q.h.s.  3. Hydrocodone as directed.  4. Xanax 1 mg every day.  5. Prevacid 30 mg every day.  6. Nortriptyline 25 mg every day.  7. Claritin 10 mg every day.  8. Requip 0.5 mg every day.  9. Temazepam 30 mg q.h.s.   PHYSICAL EXAMINATION:  VITAL SIGNS:  Blood pressure 110/82, pulse 62  regular, weight 159.  GENERAL:  A 59 year old female sitting upright in no apparent distress.  HEENT:  Normocephalic atraumatic.  NECK:  Palpable bilateral carotid pulses without bruits.  No pulses parvus  et tardus.  LUNGS:  Clear to auscultation all fields.  HEART:  Regular rate and rhythm (S1 S2).  A harsh grade 3/6 systolic  ejection murmur with a soft diastolic blow along the upper left sternal  border.  ABDOMEN:  Soft, nontender with intact bowel sounds.  EXTREMITIES:  Right groin stable with no ecchymosis, hematoma, or bruit on  auscultation; intact femoral  and distal peripheral pulse with no significant  edema.   IMPRESSION:  1. Severe aortic stenosis.      a.     Peak gradient 81-mmHg, mean gradient 45-mmHg, aortic valve area       0.9-cm2 by recent echocardiography.  2. Marked post stenotic dilatation (4.4-cm).      a.     Mild aortic regurgitation.  3. Coronary artery disease.      a.     A 90% mid RCA; 50% proximal LAD by recent cardiac       catheterization.  4. Preserved overall left ventricular function.  5. Status post abdominal surgery secondary to intestinal obstruction,      secondary to adhesive band July 2007.  6. Status post right hemicolectomy, May 2007.  7. Hyperlipidemia.  8. Gastroesophageal reflux disease, status post esophageal dilatation.   PLAN:  Although the patient was initially reluctant to consider  surgical  repair of the aortic valve, as well as coronary artery bypass grafting,  since her catheterization she has considered this carefully and is now  willing to proceed.  Following discussion with Dr. Andee Lineman, who in turn spoke  to Dr. Evelene Croon, we will arrange to have the patient follow up with Dr.  Evelene Croon early next week to discuss surgery.  In the interim, we will  continue current medication regimen with the addition of a baby aspirin.                                   Gene Serpe, PA-C                                Learta Codding, MD, [facility]   GS/MedQ  DD:  06/12/2006  DT:  06/12/2006  Job #:  [id number]   cc:   Evelene Croon, MD  Samuel Jester

## 2011-03-07 NOTE — Consult Note (Signed)
[name], [name]             ACCOUNT NO.:  [id number]   MEDICAL RECORD NO.:  [id number]          PATIENT TYPE:  AMB   LOCATION:  SDS                          FACILITY:  [facility]   PHYSICIAN:  [name], D.D.S.DATE OF BIRTH:  08/20/1951   DATE OF CONSULTATION:  06/29/2006  DATE OF DISCHARGE:                                   CONSULTATION   [name] is a 60 year old female referred by Dr. Evelene Mendez for a  dental consultation.  The patient was recent identification of severe aortic  stenosis requiring an aortic valve replacement.  The patient now seen as  part of a pre-heart-valve surgery dental protocol evaluation.   MEDICAL HISTORY:  1. Severe aortic stenosis with an area of 0.9 sqcm.  The patient with      anticipated aortic valve replacement with Dr. Evelene Mendez.  2. Coronary artery occlusive disease.  Status post cardiac catheterization      which revealed aortic stenosis, aortic insufficiency, dilated ascending      aortic aneurysm, ejection fraction of 60-65%, and two-vessel coronary      artery disease.  The patient with anticipated aortic valve replacement      along with coronary artery bypass graft procedure with Dr. Laneta Mendez.  3. History of bronchitis and asthma.  4. History of GERD.  5. Hyperlipidemia.  6. History of anxiety/depressive disorder.  7. History of degenerative disc disease status post L4-L5 back surgery.  8. Status post appendectomy in March 2007.  9. Status post right hemicolectomy in April 2007 for resection of a      carcinoid tumor in the appendix area.  10.Status post laparotomy with lysis of adhesions for a small-bowel      obstruction on April 24, 2006.  11.Status post esophageal dilatation for dysphagia.  12.Status post hysterectomy.  13.Status post bilateral hammer toe surgery.   ALLERGIES:  PENICILLIN causes hives.   MEDICATIONS:  1. Centrum Silver daily.  2. Nortriptyline 25 mg daily.  3. Prevacid 30 mg daily.  4.  Temazepam 30 mg at bedtime as needed.  5. ReQuip 0.5 mg as directed.  6. Lovastatin 40 mg daily.  7. Xanax 0.5 mg as needed.  8. Hydrocodone 10/650 of acetaminophen as needed for pain.  9. Bisoprolol 5 mg as directed.  10.Claritin 10 mg as needed.  11.Aspirin 81 mg daily.   SOCIAL HISTORY:  The patient is a widow with two children.  The patient with  a history of smoking but quit 15 years ago.  The patient currently works as  a Psychologist, counselling.   FAMILY HISTORY:  There is no history of cardiac disease.   FUNCTIONAL ASSESSMENT:  The patient remains independent for ADLs at this  time.   DENTAL HISTORY:   CHIEF COMPLAINT:  The patient needs a pre-heart-valve-surgery dental  evaluation.   HISTORY OF PRESENT ILLNESS:  The patient with recent identification of  severe aortic stenosis requiring aortic valve replacement along with a  coronary artery bypass graft procedure with Dr. Laneta Mendez.  The patient is now  seen as part of a pre-heart-valve-surgery dental protocol evaluation  to rule  out dental infection which may affect the patient's systemic health and  anticipated heart valve surgery.   The patient currently denies acute toothache, swellings, or abscesses.  The  patient was last seen approximately one to one-and-a-half years ago to have  teeth extracted and a lower partial denture fabricated.  This is with Dr.  Debby Mendez of [place], [place].  The patient denies having an  upper partial denture at this time.  The patient denies seeking regular  dental care.   DENTAL EXAMINATION:  GENERAL:  The patient is a well-developed, well-  nourished female in no acute distress.  VITAL SIGNS:  Blood pressure is 102/91, pulse is 64, temperature is 98.4.  HEAD AND NECK:  There is no palpable lymphadenopathy.  There are no TMJ  symptoms.  INTRAORAL:  The patient has normal saliva.  The patient has an excessive  maxillary right tuberosity.  DENTITION:  The patient with  multiple missing teeth #1-5 and #13-16, #17-20,  #22-26, #29-32.  PERIODONTAL:  The patient with chronic advanced periodontal disease with  plaque and calculus accumulations, gingival recession, and tooth mobility.  There is moderate bone loss noted.  ENDODONTIC:  The patient currently denies acute pulpitis symptoms.  There is  no evidence of periapical pathology at this time.  DENTAL CARIES:  There are multiple dental caries noted, several of which are  very extensive and undermine the existing dental restoration.  Extent of  caries may also be into the pulp on several teeth.  CROWN OR BRIDGE:  The patient with multiple crown or bridge restorations.  The patient has a temporary which is currently defective associated with the  temporary on tooth #10.  There are recurrent caries around the crown on #6  and #21.  PROSTHODONTIC:  The patient with lower Cusil partial denture.  This is  currently ill-fitting.  OCCLUSION:  The patient has a poor occlusive scheme secondary to multiple  missing teeth, supraeruption, drifting of the unopposed teeth into the  edentulous areas, maxillary right excessive tuberosity, and ill-fitting  mandibular Cusil denture.   RADIOGRAPH INTERPRETATION:  A Panorex x-ray was taken and supplemented with  six periapical radiographs.   There are multiple missing teeth.  There are multiple crown or bridge  restorations noted.  There are recurrent caries noted.  There are no obvious  periapical radiolucencies noted.  There is a poor occlusal scheme noted.   ASSESSMENTS:  1. Plaque and calculus accumulations.  2. Chronic periodontitis with bone loss.  3. Extensive caries as per dental charting form.  4. Multiple missing teeth.  5. Chronic periodontitis with bone loss.  6. Gingival recession.  7. Tooth mobility.  8. Supraeruption and drifting of the unopposed teeth into the edentulous      areas. 9. Short clinical crowns of remaining teeth.  10.Poor occlusal  scheme.  11.Maxillary right hyperplastic tuberosity.  12.Ill-fitting mandibular Cusil partial denture.  13.History of oral neglect.  14.Need for antibiotic premedication prior to invasive dental procedures.   PLAN/RECOMMENDATION:  1. I discussed the risks, benefits, and complications of various treatment      options with the patient in relationship to her medical and dental      conditions, anticipated heart valve surgery, and risks for future      subacute bacterial endocarditis.  We discussed the various treatment      options to include no treatment, multiple extractions with alveoplasty,      periodontal therapy, dental restorations, root canal  therapy, crown or      bridge therapy, implant therapy, preprosthetic surgery as indicated,      and replacing the missing teeth as indicated.  After a discussion with      Dr. Debby Mendez it was determined that perhaps the best treatment      option for the patient would be to extract the remaining mandibular      teeth with alveoloplasty and perform periodontal therapy and maxillary      right tuberosity reduction as patient condition and time permits.  The      patient will then need to follow up for evaluation of the temporary on      tooth #10 and recurrent caries on tooth #6.  We will also evaluate      healing and attempt to perform replacement of the missing lower teeth      as time and space permits in the dental medicine clinic.  We will need      to schedule the procedures in the operating room due to the      cardiovascular compromise at this time.  The patient is aware of the      potential risks for cardiovascular complications including stroke,      heart attack, and potential death.  The patient is aware of the risks      and agrees to proceed with procedures as planned.  2. Discussion of findings with Dr. Debby Mendez, Dr. Lewayne Bunting and Dr.      Evelene Mendez as indicated.  3. Will schedule operating room procedure as soon  as possible.      [name], D.D.S.  Electronically Signed     RFK/MEDQ  D:  06/30/2006  T:  06/30/2006  Job:  [id number]   cc:   Learta Codding, MD,FACC  [address]  [place], [place] [address]   [name], M.D.  [address]  [place]  [place] [address]

## 2011-03-07 NOTE — Op Note (Signed)
[facility]  Patient:    [name], [name] Visit Number: [id number] MRN: [id number]          Service Type: END Location: DAY Attending Physician:  Jonathon Bellows Dictated by:   Roetta Sessions, M.D. Proc. Date: 04/18/02 Admit Date:  04/18/2002 Discharge Date: 04/18/2002   CC:         [name], M.D., [place], [place]   Operative Report  PROCEDURE:  Diagnostic colonoscopy.  ENDOSCOPIST:  Roetta Sessions, M.D.  INDICATION FOR PROCEDURE:  The patient is a 60 year old lady referred for one-out-of-six Hemoccult-positive stools.  She has not had any melena or rectal bleeding.  Reflux symptoms are well-controlled on Nexium (EGD, January 03, 2002, demonstrated Schatzkis ring; she did not peptic ulcer disease).  She has been well-maintained on Nexium 40 mg orally daily; she also takes ______.  She did have some diarrhea in the wake of a course of antibiotics but those symptoms have resolved.  EGD is now being done to further evaluate Hemoccult-positive stool.  This approach has been discussed with the patient at some length in my office and again today at the bedside.  Potential risks, benefits and alternatives have been reviewed and questions answered; she is agreeable.  Please see my dictated H&P of April 12, 2002 for more information.  DESCRIPTION OF PROCEDURE:  O2 saturation, blood pressure, pulse and respirations were monitored throughout the entirety of the procedure.  CONSCIOUS SEDATION:  IV Versed and fentanyl in incremental doses.  INSTRUMENT:  Olympus video chip colonoscope.  FINDINGS:  Digital rectal examination revealed no abnormalities.  ENDOSCOPIC FINDINGS:  Prep was adequate.  Rectum:  Examination of the rectal mucosa including en face view of the anal canal demonstrated only anal canal hemorrhoids.  The rectal mucosa appeared normal.  Colon:  The colonic mucosa was surveyed from the rectosigmoid junction through the left, transverse and  right colon to the area of the appendiceal orifice, ileocecal valve and cecum.  These structures were well-seen and photographed for the record.  The colonic mucosa appeared normal to the cecum.  From the level of the cecum and ileocecal valve, the scope was slowly withdrawn and all previously mentioned mucosal surfaces were again seen and again, no other abnormalities were observed.  The patient tolerated the procedure well and was reacted at endoscopy.  IMPRESSION: 1. Anal canal hemorrhoids, otherwise, normal rectum. 2. Normal colon. 3. The patient may be Hemoccult-positive on the basis of either dietary    indiscretion prior to testing or a hemorrhoidal source.   Complete blood    count from today at [facility]:  White count is 7.6, hemoglobin    and hematocrit 12.7 and 37.4, MCV 85.6. 4. It is also possible the patient may have relatively trivial nonsteroidal    anti-inflammatory drug insult to her gastrointestinal tract secondary to    Lodine.  She is essentially asymptomatic from a gastrointestinal    standpoint.  Again, her gastroesophageal reflux disease symptoms are    well-controlled.  RECOMMENDATIONS:  If she is to continue Lodine (which I think would be acceptable at this time), she should continue Nexium 40 mg orally concomitantly, particularly in view of her GERD.  I would recommend that she have her CBC repeated in 8 to 10 weeks to make sure that her hemoglobin remains stable and within the normal range. Dictated by:   Roetta Sessions, M.D. Attending Physician:  Jonathon Bellows DD:  04/18/02 TD:  04/20/02 Job: [id number]

## 2011-03-07 NOTE — Discharge Summary (Signed)
[name], [name]             ACCOUNT NO.:  [id number]   MEDICAL RECORD NO.:  [id number]          PATIENT TYPE:  INP   LOCATION:  2025                         FACILITY:  [facility]   PHYSICIAN:  Evelene Croon, M.D.     DATE OF BIRTH:  1951-05-20   DATE OF ADMISSION:  07/13/2006  DATE OF DISCHARGE:  07/19/2006                                 DISCHARGE SUMMARY   PRIMARY DIAGNOSES:  1. Severe single vessel coronary artery disease.  2. Severe aortic insufficiency and aortic stenosis.  3. Ascending aortic aneurysm.   IN HOSPITAL DIAGNOSES:  1. Volume overload postoperatively.  2. Postoperative atrial fibrillation.   SECONDARY DIAGNOSES:  1. History of bronchitis and asthma.  2. History of esophageal reflux disease.  3. Hyperlipidemia.  4. Anxiety/depressive disorder.  N  5. History of degenerative disk disease status post L4-5 back surgery.  6. Status post appendectomy.  7. 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  8. Status post laparotomy with lysis of adhesions for small-bowel      obstruction, July 2007.  9. Status post esophageal dilatation  for dysphagia.  10.Status post  hysterectomy.  11.Status post bilateral hammer toe surgery.   ALLERGIES:  Allergic to PENICILLIN.   OPERATIONS AND PROCEDURES:  1. Coronary artery bypass grafting x2 using a right internal mammary      artery graft to right coronary artery and a saphenous vein graft to the      distal right coronary artery.  2. Replacement of aortic valve in the ascending aortic aneurysm using a 25-      mm St. Jude mechanical valve conduit and a 30 mm Hemashield tube graft      under deep hypothermic circulatory arrest with reimplantation of both      right and left main coronary arteries into the valve conduit.      Endoscopic vein harvesting from right leg.  Insertion of right femoral      anterior line for blood pressure monitoring.   HISTORY AND PHYSICAL AND HOSPITAL  COURSE:  The patient is a 60 year old  female who has undergone 3 abdominal surgeries since March 2007 and during  that time a systolic murmur was noted on exam.  She underwent echocardiogram  which showed severe aortic stenosis with a peak gradient of 81 and a mean  gradient 45.  Aortic valve area was 0.9 cm2.  There is mild aortic  insufficiency, marked post stenotic dilatation of the ascending aorta  measured at 4.47 cm by echo. Ejection fraction was estimated at 60%.  She  subsequently underwent cardiac catheterization by Dr. Diona Browner on May 29, 2006.  This showed the peak aortic transvalvular gradient of 36 mmHg with a  mean gradient of 20 mmHg.  Valve area was not calculated due to difficulty  obtaining venous access for a heart catheterization.  There is at least 2+  aortic insufficiency with normal left ventricular ejection fraction of 55%.  There are no wall motion abnormalities.  Coronary angiography showed a 90%  stenosis in the mid portion of the  right coronary.  There is also mild focal  narrowing in the proximal portion of the LAD  __________  was about 50%.  After review of the catheterization, echocardiogram and examination the  patient by Dr. Laneta Simmers, it was felt that replacement of her aortic valve in  the ascending aortic aneurysm as well as coronary bypass grafting to the  right coronary artery was indicated.  The risks and benefits were discussed  with the patient.  The patient had not seen a dentist in several years and  had multiple bad teeth and therefore Dr. Kristin Bruins was consulted who  performed multiple extractions and interventions for dental disease.  She  also underwent a CT scan of the chest which showed a large ascending aortic  aneurysm essentially extending from the aortic valve to the undersurface of  the aortic arch with maximum dimension of over 6 cm.  There was also a  common trunk coming off the aortic arch that comprised the innominate and  left  common carotid artery.  Surgery was scheduled for July 13, 2006.  For details of the patient's past medical history and physical exam, please  see dictated history and physical.   The patient was taken to the operating room on July 13, 2006 where she  underwent coronary artery bypass grafting x2 using a right internal mammary  artery graft to right coronary artery and a saphenous vein graft to the  distal right coronary artery.  She had replacement of the aortic valve in  the ascending aortic aneurysm using a 25 mm St. Jude mechanical valve  conduit and a 30 mm Hemashield tube graft under deep hypothermic circulatory  arrest with reimplantation of both the right and left main coronary arteries  into the valve conduit.  Endoscopic vein harvesting from the right leg was  done.  The patient tolerated this procedure well and was transferred up to  the intensive care unit in stable condition.  Following surgery the patient  was seen to be hemodynamically stable.  She was extubated late evening early  morning following surgery.  Following extubation, the patient seemed to be  alert and oriented x4.  Neuro intact.  On postop day #1, the patient's blood  pressure was seen to be stable and she was able to be weaned off her drip.  She was out of bed in a chair postop day #1.  She remained hemodynamically  stable with a hematocrit of  9.9 and 29.  By postop day #2, the patient was  out of bed ambulating well.  Chest tubes and lines discontinued. She was  transferred up to 2000 in stable condition. While in 2000, the patient did  develop postoperative atrial fibrillation the evening of postop day #3.  She  was started on IV amiodarone at that time.  She was able to convert back to  normal sinus rhythm.  Postop day #4, she was switched from IV to p.o.  amiodarone and remained in normal sinus rhythm during the remainder of her hospital course.  The patient was noted to have volume overload and  was  placed on diuretics.  She was back near her baseline weight prior to  discharge home.  Did not require any further diuretics at discharge.  The  patient was started on Coumadin postoperatively for the mechanical valve.  PT/INRs were obtained daily.  Coumadin was adjusted appropriately.  The  patient's INR was therapeutic greater than 2.5 prior discharge home.  Vital  signs were monitored and  seen to be stable.  She was able to be weaned off  oxygen sating greater than 90% on room air.  The patient's incisions were  clean, dry and intact and healing well.  Again she was in normal sinus  rhythm prior to discharge home.  Respiratory was clear to auscultation  bilaterally.  The patient was out of bed ambulating well.  She was  tolerating a regular diet well.  No nausea or vomiting noted.  Labs on  postop day #4 showed a white blood cell count 9.9, hemoglobin 9.4,  hematocrit of 27.2, platelet count 165.  Sodium was 136, potassium 4.2,  chloride of 101, bicarbonate 30, BUN of 13, creatinine 0.8 and glucose of  116.  INR was 2.7 on postop day #4.   The patient is tentatively ready for discharge home in the next 1-2 days.   FOLLOW-UP APPOINTMENTS:  A follow-up appointment will be arranged with Dr.  Laneta Simmers for in 3 weeks.  Our office will contact the patient with this  information.  The patient will need to contact Dr. Margarita Mail office and  schedule a follow-up appointment with him in 2 weeks.  She will also need to  contact the [facility] to arrange an appointment with them for  a PT/INR check.  This should be done 2 days post discharge.   ACTIVITY:  The patient was instructed no driving until released to do so, no  lifting over 10 pounds.  She was told to ambulate 3-4 times per day,  progress as tolerated and to continue her breathing exercises.   INCISIONAL CARE:  The patient was told to shower washing her incisions using  soap and water.  She is to contact the office if  she develops any drainage  or opening from any of her incision sites.   DIET:  The patient was instructed on diet to be low-fat, low-salt.   DISCHARGE MEDICATIONS:  1. Xanax 0.5 mg b.i.d. p.r.n.  2. Temazepam 30 mg q.h.s..  3. Aspirin 81 mg daily.  4. Toprol XL 25 mg daily.  5. Altace 2.5 mg daily.  6. Lovastatin 40 mg at night.  7. Amiodarone 40 mg b.i.d. x2 weeks then 200 mg b.i.d.  8. Prevacid 30 mg daily.  9. Pamelor 25 mg daily.  10.Coumadin will be dosed prior to the patient's discharge based on her      PT/INR level.  11.Claritin 10mg  daily.  12.Requip 0.5 mg daily.  13.Oxycodone 5 mg 1-2 tabs q.4-6 hours p.r.n. pain.      Theda Belfast, [place]      Evelene Croon, M.D.  Electronically Signed    KMD/MEDQ  D:  07/17/2006  T:  07/19/2006  Job:  [id number]   cc:   Learta Codding, MD,FACC

## 2011-03-07 NOTE — Assessment & Plan Note (Signed)
[facility]                          [facility]ICE NOTE   [name], [name]                    MRN:          [id number]  DATE:12/31/2006                            DOB:          12-07-1950    HISTORY OF PRESENT ILLNESS:  The patient is a 60 year old female with a  history of bicuspid aortic valve, severe aortic stenosis.  The patient  is status post aortic valve as well as aortic conduit replacement.  The  patient is doing quite well and she presents for a followup today to  discuss the results of her echocardiographic study.  The patient had a  pericardial effusion noted postoperatively and scheduled for a study to  further follow this up.  The patient has done quite well.  She denies  any chest pain, shortness of breath, orthopnea, or PND.  Her  echocardiographic study continues to show normal valve gradients and  there is evidence that she only now has a small pericardial effusion.   MEDICATIONS:  1. Lovastatin 40 mg q.h.s.  2. Xanax as needed.  3. Prevacid 30 mg a day.  4. Multivitamin daily.  5. ReQuip 0.5 daily.  6. Temazepam 30 q.h.s.  7. Metoprolol 25 mg p.o. daily.  8. Zyrtec 10 mg p.o. q.a.m.   PHYSICAL EXAMINATION:  VITAL SIGNS:  Blood pressure 126/82, heart rate  64, weight 169 pounds.  NECK:  Normal carotid upstroke, no carotid bruits.  LUNGS:  Clear breath sounds bilaterally.  HEART:  Regular rate and rhythm.  Normal S1, S2.  No murmurs, rubs, or  gallops.  ABDOMEN:  Soft, nontender.  No rebound or guarding.  Good bowel sounds.  EXTREMITIES:  No cyanosis, clubbing or edema.  NEUROLOGIC:  The patient alert, oriented, and grossly nonfocal.   PROBLEM LIST:  1. Status post St. Jude aortic valve replacement conduit ([place]      procedure).      a.     History of severe aortic stenosis.      b.     History of bicuspid aortic valve.  2. Postoperative atrial fibrillation, resolved, in normal sinus rhythm      and off  amiodarone.  3. Coronary artery disease.      a.     Status post two-vessel coronary artery bypass grafting.      b.     Normal left ventricular function.  4. Dyslipidemia.  5. Gastroesophageal reflux disease.  6. Multiple abdominal surgeries.   PLAN:  1. The patient's pericardial effusion has essentially resolved.  2. The patient is quite stable from a cardiovascular perspective.  3. The patient is followed in the Coumadin clinic.  Her INR was 1.6      yesterday and this related to the fact that she had an increased      intake of collard greens and pinto beans.  We discussed with the      patient her compliance with the diet.  Coumadin was adjusted      accordingly and she will be seen back in 2 weeks in the Coumadin      clinic.  Learta Codding, MD,FACC  Electronically Signed    GED/MedQ  DD: 12/31/2006  DT: 01/01/2007  Job #: [id number]

## 2011-03-07 NOTE — Assessment & Plan Note (Signed)
[facility]                            [facility]ICE NOTE   Amy, Mendez                    MRN:          [id number]  DATE:09/07/2006                            DOB:          1951/05/14    PRIMARY CARDIOLOGIST:  Learta Codding, MD, [facility].   REASON FOR VISIT:  Post hospital followup.   [name] returns to our clinic after undergoing successful two-vessel  CABG and aortic valve replacement with a St. Jude mechanical valve, by Dr.  Evelene Croon on September 24 when she presented for elective surgery.   The patient has had post hospital followup with Dr. Kathlee Nations Trigt and has  been doing extremely well from a clinical standpoint since then. She reports  improvement in her overall exercise tolerance and has much less shortness of  breath.   The patient did have some postoperative atrial fibrillation converted to NSR  prior to discharge. She is on chronic Coumadin for her valve and was also  discharged on amiodarone with a tapering dose.   The patient has been exercising on her treadmill at home but continues to  have some mild exertional fatigue. She has been instructed to not return to  work until after the first of the year.   An EKG today reveals NSR at 66 BPM with normal access and nonspecific  changes.   CURRENT MEDICATIONS:  1. Amiodarone 200 b.i.d.  2. Metoprolol 25 daily.  3. Requip 0.5 daily.  4. Nortriptyline 25 daily.  5. Claritin 10 daily.  6. Prevacid 30 daily.  7. Lovastatin 40 daily.  8. Xanax 1 mg daily.   PHYSICAL EXAMINATION:  VITAL SIGNS:  Blood pressure 115/72, pulse 68,  regular. Weight 158.8.  GENERAL:  A 60 year old female sitting upright in no distress.  NECK:  Palpable carotid pulses with soft radiating murmurs.  LUNGS:  Clear to auscultation all fields.  HEART:  Regular rate and rhythm (S1, S2). Crisp S2 click with no significant  murmur. No rubs.  EXTREMITIES:  Palpable pulses without  edema.  NEUROLOGIC:  No focal deficit.   IMPRESSION:  1. Status post St. Jude aortic valve replacement.      a.     Secondary to severe aortic stenosis/aortic insufficiency.      b.     Postoperative atrial fibrillation - maintaining normal sinus       rhythm since discharge.  2. Severe single-vessel coronary artery disease.      a.     Status post two-vessel coronary artery bypass graft:  Right       internal mammary artery - right coronary artery; saphenous vein graft       - right coronary artery.  3. Normal left ventricular function.  4. Hyperlipidemia.  5. Gastroesophageal reflux disease.      a.     Status post esophageal dilatation.  6. Status post multiple abdominal surgeries.      a.     Secondary to intestinal obstruction.   PLAN:  1. Discontinue amiodarone.  2. Check postop two-view chest x-ray.  3. Resume clinic followup with  Dr. Andee Lineman in 3 months.      Gene Serpe, PA-C  Electronically Signed      Learta Codding, MD,FACC  Electronically Signed   GS/MedQ  DD: 09/07/2006  DT: 09/07/2006  Job #: [id number]

## 2011-03-07 NOTE — Op Note (Signed)
[name], [name]             ACCOUNT NO.:  [id number]   MEDICAL RECORD NO.:  [id number]          PATIENT TYPE:  INP   LOCATION:  2025                         FACILITY:  [facility]   PHYSICIAN:  Evelene Croon, M.D.     DATE OF BIRTH:  Jun 27, 1951   DATE OF PROCEDURE:  07/13/2006  DATE OF DISCHARGE:                                 OPERATIVE REPORT   PREOPERATIVE DIAGNOSIS:  1. Severe single vessel coronary disease.  2. Severe aortic insufficiency and aortic stenosis.  3. Ascending aortic aneurysm.   POSTOPERATIVE DIAGNOSIS:  1. Severe single-vessel coronary disease.  2. Severe aortic insufficiency and aortic stenosis.  3. Ascending aortic aneurysm.   OPERATIVE PROCEDURE:  Median sternotomy, extracorporeal circulation,  coronary bypass graft surgery x2 using a right internal mammary artery graft  to the right coronary artery and a saphenous vein graft to the distal right  coronary artery, replacement of aortic valve and the ascending aortic  aneurysm using a 25-mm St. Jude mechanical valve conduit and a 30-mm  Hemashield tube graft under deep hypothermic circulatory arrest with  reimplantation of both right and left main coronary arteries into the valve  conduit.  Endoscopic vein harvesting from the right leg.  Insertion of the  right femoral arterial line for blood pressure monitoring.   ATTENDING SURGEON:  Evelene Croon, M.D.   ASSISTANT:  Salvatore Decent. Cornelius Moras, M.D.   SECOND ASSISTANT:  Jerold Coombe, P.A.-C.   ANESTHESIA:  General endotracheal.   CLINICAL HISTORY:  The patient is a 60 year old woman who has undergone  three abdominal surgeries since March 2007 and, during that time, had a  systolic murmur noted on examination.  She underwent an echocardiogram which  showed severe aortic stenosis with a peak gradient of 81 and a mean gradient  of 45.  The aortic valve area was 0.9 cm2.  There is mild aortic  insufficiency and marked post stenotic dilatation of the ascending  aorta  measured at 4.47 cm by echo.  Ejection fraction was estimated at 60%.  She  subsequently underwent cardiac catheterization by Dr. Diona Browner on May 29, 2006.  This showed the peak aortic transvalvular gradient of 36 mmHg with a  mean gradient 28 mmHg.  The valve area was not calculated due to difficulty  obtaining venous access for right heart catheterization.  There was at least  2+ aortic insufficiency with normal left ventricular ejection fraction of  65%.  There were no wall motion abnormalities.  Coronary angiography showed  a 90% stenosis in the mid portion of the right coronary.  There is also mild  focal narrowing in the proximal portion of the LAD that Dr. Diona Browner thought  was about 50%, but I think it was significantly less than that and I did not  feel it was significant.  After review of the catheterization,  echocardiogram, and examination of the patient, it was felt that replacement  of her aortic valve and the ascending aortic aneurysm as well as coronary  bypass to the right coronary artery was indicated.  She had not seen a  dentist in several years and  had multiple bad teeth and, therefore, was  referred to the [facility] and saw [name] who  performed multiple extractions and interventions for her dental disease.  She also underwent a CT scan of the chest which showed a large ascending  aortic aneurysm essentially extending from the aortic valve up to the under  surface of the aortic arch with a maximum dimension of over 6 cm.  There  also was a common trunk coming off the aortic arch that comprised the  innominate and left common carotid artery.  After dental clearance was  obtained, I discussed the operative procedure with the patient and her  family including use of a mechanical valve given her age.  I also discussed  the use of the right internal mammary artery for grafting her right coronary  arteries since she is only 60 years  old.  I discussed alternatives,  benefits, and risks including bleeding, blood transfusion, infection,  stroke, myocardial infarction, graft failure, and death.  She understood and  agreed to proceed.   OPERATIVE PROCEDURE:  The patient was taken to the operating room and placed  on the table in supine position.  After induction of general endotracheal  anesthesia, a Foley catheter was placed in the bladder using sterile  technique.  Preoperative intravenous vancomycin and Avelox were given.  Then, the chest, abdomen and both lower extremities were prepped and draped  in the usual sterile manner.  Transesophageal echocardiogram was performed  by anesthesiology and showed severe aortic insufficiency and at least  moderate aortic stenosis with the valve area calculated at 1 cm2.  There was  marked calcification of the aortic valve with what appeared be a bicuspid  valve.  There is a large ascending aortic aneurysm.  Left ventricular  function appeared fairly well preserved with moderate left ventricular  hypertrophy.  There was no mitral regurgitation.   Then, the chest was opened through a median sternotomy incision and the  pericardium opened in the midline.  Examination of the heart showed good  right ventricular contractility.  The heart was displaced somewhat  transversely due to the size of the ascending aortic aneurysm which was  quite large.  Then, the right internal mammary artery graft was harvested  from the chest wall as a pedicle graft.  This was a medium caliber vessel  with good blood flow through it.  The patient was then heparinized and when  an adequate activated clotting time was achieved, the distal ascending aorta  was cannulated using a 20-French aortic cannula for arterial in flow.  Venous outflow was achieved using a two stage venous cannula for the right  atrial appendage.  A left ventricular vent was placed in the right superior pulmonary vein and a retrograde  cardioplegic cannula placed through a  pursestring suture in the right atrium.   The patient placed on cardiopulmonary bypass and cooled to a bladder  temperature of 19 degrees centigrade.  We continued to monitor bladder  temperature is well as the BIS reading and as the temperature neared 19  degrees centigrade, the BIS reading went to 0.  The patient was given  Pentothal by anesthesiology.  The head was then placed in the Trendelenburg  position.  A retrograde cerebral plegia cannula was inserted through a  pursestring suture in the superior vena cava.  The superior vena cava was  encircled with a loop.  Then, when the bladder temperature reached 19  degrees centigrade, the circulation was  stopped.  The aorta was opened  transversely just proximal to the takeoff of the innominate artery.  The  aortic wall of the aortic arch appeared to be of good quality.  There was  minimal plaque present.  The opening of the trunk going to the innominate  and left common carotid artery was identified and did not appear aneurysmal.  Then, a 30 mm Hemashield platinum tube graft with a 10-mm sidearm was  chosen.  It was cut to the appropriate size and angle.  It was then  anastomosed end-to-end to the under surface of the aortic arch for hemi-arch  replacement using continuous 3-0 Prolene suture with a felt strip to  reinforce the anastomosis.  Then BioGlue was used to coat the anastomosis  for hemostasis.  Then, a 24-mm arterial cannula was inserted into the 10 mm  sidearm and affixed using multiple had heavy sutures.  During the period of  circulatory arrest, retrograde cerebral plegia was given through the  superior vena cava cannula at about 150 mL per minute.  The pressure was  monitored by perfusion.  After the distal anastomosis was performed the  crossclamp was placed just proximal to the sidearm and circulation restored.  Total circulatory arrest time was 24 minutes.  Care was taken to remove  all  air from the aortic arch prior to placing crossclamped.  The anastomoses  appeared hemostatic.  The patient was then rewarmed to 28 degrees  centigrade.   Then, the remainder of the ascending aortic aneurysm was excised.  Examination of the native valve showed that there were two leaflets with  complete fusion of the commissure between the left and noncoronary cusps.  The right and left coronary ostia were identified and then were excised as  buttons.  Care was taken to prevent rotation and these were retracted out of  the way.  Then, the native aortic valve was excised.  There was a  significant amount of calcium along the noncoronary cusp and this was  removed using a rongeur.  Care was taken to remove all particulate debris.  The left ventricle was irrigated with iced saline solution.  Then, the  annulus was sized and a 25 mm St. Jude mechanical valve conduit was chosen.  This had model number [id number], serial number [id number].  Then, a series of 2-0 Ethibond horizontal mattress sutures were placed around the annulus  with the pledgets in the subannular position.  The sutures were placed  through a strip of pericardium to act is a gusset and then through the  sewing ring of the valve.  The valve was lowered into place.  The sutures  were tied sequentially.  The valve seated nicely and the leaflets were  moving normally.  The proximal suture line was then coated with BioGlue for  hemostasis.   Then, the position of the left coronary anastomosis was marked and a small  opening made in the graft using cautery.  The right coronary button was  anastomosed to this opening in an end-to-side manner continuous 5-0 Prolene  suture.  This anastomosis was lightly coated with BioGlue for hemostasis.  Then, the proximal and distal grafts were cut to the appropriate length and  were anastomosed end-to-end using continuous 3-0 Prolene suture.  This was  also coated with BioGlue for  hemostasis.   Then, the position of the right coronary anastomosis was marked.  A small  opening was made in the graft with the cautery and the right coronary button  was anastomosed in an end-to-side manner using continuous 5-0 Prolene  suture.  This anastomosis was also the lightly coated with BioGlue.   Additional doses of retrograde cardioplegia were given throughout the case  at about 20-minute intervals to maintain myocardial temperature around 10  degrees centigrade throughout the case.  Systemic hypothermia was maintained  at 28 degrees centigrade during the remainder of the case and topical  hypothermia with iced saline was used.  A temperature probe placed in the  septum and an insulating pad in the pericardium.   Then, the distal anastomosis was performed to the right coronary.  The area  just beyond the acute margin was identified and was soft.  The internal  diameter was about 2 mm.  The conduit used was the right internal mammary  graft and this was brought through an opening in the right pericardium  anterior to the phrenic nerve.  It was anastomosed to the right coronary  artery in an end-to-side manner continuous 8-0 Prolene suture.  The pedicle  was sutured to the epicardium with 6-0 Prolene sutures.   Then, the left side of the heart was de-aired.  The head was placed in a  Trendelenburg position.  A vent cannula was placed directly into the  ascending aortic graft for de-airing purposes.  Then, the clamp was removed  from the right internal mammary pedicle.  The crossclamp was removed with a  time 158 minutes.  There was spontaneous return of sinus rhythm.  The distal  anastomosis appeared hemostatic.  Two temporary right ventricular and right  atrial pacing wires were placed and brought through the skin.   When the patient had been rewarmed to 37 degrees centigrade, she was weaned  from cardiopulmonary bypass on low dose dopamine.  On separation from  bypass, it  was apparent that there was right ventricular dysfunction with an elevated CVP and obvious right ventricular dilatation and hypokinesis.  I  felt this may be due to either some air or possibly inadequate right  internal mammary flow.  We went back on bypass and rested for another 15 to  20 minutes to allow reperfusion.  The patient was then again weaned from  cardiopulmonary bypass with the same result.  The heart looked fairly good  until we were completely off bypass and then right ventricular dysfunction  developed.  There were also some atrial arrhythmias and I was concerned that  the right internal mammary flow may not be adequate for this right coronary.  Therefore, we went back on bypass and a segment of saphenous vein was  harvested from the right leg using endoscopic vein harvest technique.  This  vein was of medium size and good quality.  Then, the aortic graft was  crossclamped and 500 mL of antegrade cold blood cardioplegia was  administered with quick arrest of the heart.  Then, the saphenous vein was  anastomosed to the distal right coronary artery in an end-to-side manner  using continuous 7-0 Prolene suture.  Flow was noted through the graft and  was excellent.  The proximal anastomosis was then performed to the ascending  aortic graft in an end-to-side manner continuous 6-0 Prolene suture.   Then, the clamp was removed from the right internal mammary pedicle and the  crossclamp removed.  Total additional crossclamp time was 21 minutes.  There  was spontaneous return of sinus rhythm.  After another period of  reperfusion, the patient was weaned from cardiopulmonary bypass on low dose  dopamine  and milrinone.  Total bypass time was 354 minutes.  Transesophageal  echocardiogram was performed and showed well preserved left ventricular  function.  Right ventricular function appeared good.  The aortic valve  prosthesis was functioning normally and there was no mitral  regurgitation.  Cardiac output was about 4 liters a minute.  Protamine was then given and  the venous cannula removed.  The side graft on the ascending aortic graft  was then clamped and oversewn in two layers of continuous 4-0 Prolene  suture.  Protamine was then given.  The patient was coagulopathic and  required fresh frozen plasma and platelets to achieve adequate hemostasis.  Then, four chest tubes were placed with bilateral pleural tubes, two in the  posterior pericardium, one in the anterior mediastinum.  The sternum was  closed with #6 stainless steel wires.  The fascia was closed with continuous  #1 Vicryl suture.  The subcutaneous tissue was closed with continuous 2-0  Vicryl and the skin with 3-0 Vicryl subcuticular closure.  The left lower  extremity vein harvest site was closed in layers in a similar manner.   We decided to place a right femoral arterial line for blood pressure  monitoring since the patient had a labile blood pressure.  Therefore, the  right common femoral artery was cannulated with a needle and a guidewire advanced without difficulty.  Over the guidewire, an arterial line was  inserted without difficulty and the guide-wire removed.  It was connected to  the blood pressure monitoring line and appeared to correlate well with the  radial A-line.  It was affixed to the skin with silk sutures.  Then the  sponge, needle, and instrument counts were correct according to the scrub  nurse.  A dry, sterile dressing was applied over the incision and around the  chest tubes which were hooked to Pleur-Evac suction.  The patient remained  hemodynamically stable and was transported to the SICU in guarded but stable  condition.      Evelene Croon, M.D.  Electronically Signed     BB/MEDQ  D:  07/13/2006  T:  07/15/2006  Job:  [id number]   cc:   [facility]

## 2011-03-07 NOTE — Op Note (Signed)
NAMETEARSA, KOWALEWSKI             ACCOUNT NO.:  [id number]   MEDICAL RECORD NO.:  [id number]          PATIENT TYPE:  AMB   LOCATION:  DAY                          FACILITY:  [facility]   PHYSICIAN:  Charlynne Pander, D.D.S.DATE OF BIRTH:  May 15, 1951   DATE OF PROCEDURE:  07/02/2006  DATE OF DISCHARGE:                                 OPERATIVE REPORT   PREOPERATIVE DIAGNOSES:  1. Severe aortic stenosis.  2. Pre-Aortic valve replacement dental protocol.  3. Coronary artery disease  4. Chronic periodontitis.  5. Extensive dental caries.  6. Chronic periodontitis.  7. Accretions.  8. Excessive maxillary right tuberosity.   POSTOPERATIVE DIAGNOSES:  1. Severe aortic stenosis.  2. Pre-Aortic valve replacement dental protocol.  3. Coronary artery disease  4. Chronic periodontitis.  5. Extensive dental caries.  6. Chronic periodontitis.  7. Accretions.  8. Excessive maxillary right tuberosity.   OPERATIONS:  1. Surgical extraction of tooth numbers 21, 27 and 28.  2. Two quadrants of alveoloplasty.  3. Maxillary right tuberosity reduction.  4. Gross debridement of the remaining dentition.   SURGEON:  Charlynne Pander, D.D.S.   ASSISTANT:  Elliot Dally (Sales executive).   ANESTHESIA:  Monitored anesthesia care per the Anesthesia Team.   MEDICATIONS:  1. Clindamycin 600 mg IV 1 hour prior to invasive dental procedures.  2. Local anesthesia with a total utilization of two carpules each      containing 36 mg of Xylocaine with 0.018 mg of epinephrine as well as      one carpule containing 54 mg of mepivacaine with no epinephrine.   SPECIMENS:  There were three teeth that were discarded.   COMPLICATIONS:  None.   ESTIMATED BLOOD LOSS:  Less than 50 mL.   FLUIDS:  500 mL lactated Ringer solution.   INDICATIONS:  The patient was recently diagnosed with severe aortic  stenosis.  The patient with anticipated aortic valve replacement heart  surgery.  Dental consultation was  requested as part of a preheart valve  surgery dental protocol evaluation.  The patient was examined and treatment  planned for multiple extractions of the remaining lower teeth with  alveoloplasty, preprosthetic surgery as indicated, and gross debridement of  the remaining dentition.  This treatment plan was formulated to decrease the  risk and complications associated with dental infection from an affecting  the patient's systemic health and anticipated heart valve surgery as well as  to assist in future prosthodontic rehabilitation..   OPERATIVE FINDINGS:  The patient was examined in the operating room #6.  The  teeth were identified for extraction.  The patient was noted to be affected  by chronic periodontitis, accretions, multiple dental caries, and the  presence of maxillary right hyperplastic tuberosity.  The aforementioned  necessitated removal of remaining lower teeth numbers 21, 27, 28 with  alveoloplasty, maxillary right tuberosity reduction, and gross debridement  of the remaining dentition.   DESCRIPTION OF PROCEDURE:  The patient was brought to main operating room  #6.  The patient was then placed in supine position on the operating room  table.  Monitored anesthesia care was induced  per the Anesthesia Team.  The  patient was then prepped and draped in usual manner for dental medicine  procedure.  The oral cavity was thoroughly examined with the findings noted  above.  The patient was then ready for the dental medicine procedure as  follows:   Local anesthesia was administered sequentially over the 1-hour long  procedure with a total utilization of two carpules each containing 36 mg  Xylocaine with 0.018 mg of epinephrine as well as one carpules containing 54  mg of mepivacaine with no epinephrine.   The mandibular left and right quadrants were first approached.  The patient  was anesthetized utilizing the Xylocaine with epinephrine and mepivacaine  with no epinephrine.   A 15 blade incision was made from the distal of number  20 and extended to the mesial of number 23, surgical flap was then carefully  reflected.  Appropriate amounts of facial and interseptal bone were removed  at this time.  Tooth number 21 then had the coronal segment removed with a  151 forceps.  The root was further elevated and then removed with rongeurs  without complications.  Alveoplasty was then performed utilizing rongeurs  and bone file.  The surgical site was irrigated with copious amounts of  sterile saline.  The tissues were approximated and trimmed appropriately.  The surgical site was then closed from the distal of #20 and extended to the  mesial of #23 utilizing 3-0 chromic gut suture in a continuous interrupted  suture technique x one.   Tooth numbers 27 and 28 were then approached, 15 blade incision was made  from distal of #29 extended to the mesial of #25, surgical flap was then  carefully reflected.  Appropriate amounts of buccal and interseptal bone was  removed with the surgical handpiece and bur and copious amounts of sterile  saline.  The teeth were then subluxated with a series of straight elevators.  Tooth numbers 27 and 28 were then removed with a 151 forceps without  complications.  Alveoplasty was then performed utilizing rongeurs and bone  file.  Tissues were approximated and trimmed appropriately.  The surgical  site was then irrigated with copious amounts sterile saline.  The surgical  site was then closed from the distal of #29 extended to the mesial of #25  utilizing 3-0 chromic gut suture in a continuous interrupted suture  technique x1.   At this point in time, the patient's condition was felt to be stable and it  was determined that the maxillary right tuberosity reduction would be done  at this time.  The patient was anesthetized utilizing one carpule of  Xylocaine with epinephrine.  A 15 blade incision was made from the maxillary right tuberosity  and extended to the mesial of number 6.  A surgical flap  was then carefully reflected.  Excessive maxillary soft tissue was then  removed with a 15 blade and soft tissue pickups.  Alveoplasty was then  performed utilizing rongeurs and bone file to further achieve the tuberosity  reduction.  The surgical site was then irrigated with copious amounts  sterile saline.  The surgical site was then closed from the maxillary right  tuberosity and extended to the mesial of #6 utilizing 3-0 chromic gut suture  in a continuous interrupted suture technique x1.   At this point in time the remaining teeth were approached.  A KAVO Sonic  scaler was then utilized to remove accretions around the remaining teeth.  A  series of curettes  were then utilized to further remove accretions.  The  accretion removal was then further refined with a KAVO Sonic scaler.  At  this point in time, the entire mouth was irrigated with copious amounts  sterile saline.  The patient was examined for complications, seeing none,  dental medicine procedure was deemed to be complete.  The patient was then  handed over to the anesthesia team for final disposition after the placement  of 4x4 gauzes to aid hemostasis.  The patient was then transported to the  post anesthesia care unit with stable vital signs and good oxygenation  level.  All counts were correct from the dental medicine procedure.  The  patient will be seen approximately for evaluation for suture removal.  I  will discuss the findings of the surgery with Dr. Laneta Simmers and have him  proceed with aortic valve replacement as indicated.  The timing of the  aortic valve replacement will be up to Dr. Laneta Simmers.      Charlynne Pander, D.D.S.  Electronically Signed     RFK/MEDQ  D:  07/02/2006  T:  07/03/2006  Job:  [id number]   cc:   Evelene Croon, M.D.  [address]  [place]  [place] [address]   Renae Fickle Barrett   Learta Codding, MD,FACC  [address]  [place],  [place] [address]

## 2011-03-07 NOTE — Cardiovascular Report (Signed)
[name], ROMA NO.:  [id number]   MEDICAL RECORD NO.:  [id number]          PATIENT TYPE:  OIB   LOCATION:  1962                         FACILITY:  [facility]   PHYSICIAN:  Jonelle Sidle, MD DATE OF BIRTH:  April 19, 1951   DATE OF PROCEDURE:  05/29/2006  DATE OF DISCHARGE:                              CARDIAC CATHETERIZATION   INDICATION:  [name] is a 60 year old woman with a history of  exertional dyspnea, hyperlipidemia, gastroesophageal reflux disease and  recent repair of a small-bowel obstruction surgically.  She was noted to  have a cardiac murmur and underwent an echocardiogram which revealed  evidence of a bicuspid aortic valve with aortic stenosis, mean gradient of  45 mmHg, and valve area of 0.9 cm2 with overall normal ventricular function.  She was evaluated by Dr. Andee Lineman and referred for cardiac catheterization to  clarify the coronary anatomy and aortic valve.  Of note, she was described  as having post stenotic aortic dilatation of 4.47 cm.  The potential risks  and benefits were explained to the patient in advance, and informed consent  was obtained.   PROCEDURE PERFORMED:  1. Left heart catheterization  2. Selective coronary angiography.  3. Left ventriculography.  4. Aortic root injection.   ACCESS AND EQUIPMENT:  The area about the right femoral artery was  anesthetized with 1% lidocaine.  Access to the artery was somewhat  difficult, requiring the use of a wooly wire, and initially a 4-French  sheath was placed.  This was ultimately upsized to a 5-French sheath for  better catheter support for angiography.  The right femoral vein was unable  to be easily accessed, and, therefore, right heart catheterization was not  performed to reduce the risk of complication to the patient.  A standard 4-  [place] JR-4 catheter was used for selective right coronary artery  angiography.  Several catheters were attempted for engage into the left  main; however, it was noted that the patient had fairly significant  aneurysmal dilatation of the aortic arch limiting catheter manipulation.  Ultimately, a 5-French AL-3 catheter was successfully used to cannulate the  left main coronary artery.  A left heart catheterization was performed using  an angled pigtail catheter that was placed over a straight exchange wire  that was initially passed across the aortic valve through the AL-3 catheter.  Transaortic valve gradients were also obtained in this manner.  A total of  155 cc of Omnipaque were used.  The patient tolerated the procedure well  without any complications.   HEMODYNAMICS:  Aorta 121/73 mmHg.  Left ventricle 157/16 mmHg.  Peak  transvalvular gradient was 36 mmHg with a mean gradient of 28 mmHg.   ANGIOGRAPHIC FINDINGS:  1. The left main coronary artery arises high on the left coronary cusp and      is free of significant flow-limiting coronary atherosclerosis.  This      vessel gives rise to the left anterior descending and circumflex      coronary arteries.  2. The left anterior descending is a medium caliber vessel with  calcification noted proximally and one large bifurcating diagonal      branch.  There is approximately 50% focal stenosis in the proximal      portion of the left anterior descending and otherwise minor luminal      irregularities.  3. The circumflex coronary artery is a medium caliber vessel with two      obtuse marginal branches, the second of which is fairly large and has      multiple branches.  No significant flow-limiting coronary      atherosclerosis is noted.  4. The right coronary artery is a medium caliber vessel that is dominant.      There was a fairly large right ventricular marginal branch, posterior      descending branch, posterolateral branches.  There is a relatively      complex-appearing 90% stenosis in the midportion of the right coronary      artery.  5. Aortic root injection  reveals significant aneurysmal dilatation of the      ascending aorta, as well as at least 2+ aortic regurgitation.  There is      calcification noted of the aortic valve.   Left ventriculography was performed in the RAO projection and revealed an  ejection fraction of approximately 65% without focal anterior or inferior  wall motion abnormality and no significant mitral regurgitation.   DIAGNOSES:  1. Coronary artery disease as outlined including a 90% complex stenosis of      the mid right coronary artery, as well as a 50% proximal left anterior      descending stenosis.  2. Aortic stenosis, likely moderate, with a mean transvalvular gradient of      28 mmHg and calcification noted radiographically.  Right heart      catheterization was not performed due to difficulty obtaining venous      access.  A valve area was, therefore, not calculated.  3. Ascending aortic aneurysm  4. At least 2+ aortic regurgitation with overall normal left ejection      fraction of 65% and no focal wall motion abnormality.   DISCUSSION:  I reviewed results with the patient, her family and with Dr.  Andee Lineman by phone.  At this point, she is not ready to commit to a surgical  repair and would like to consider the matter further.  She is having  exertional shortness of breath without rest symptoms at this time.  I  reviewed the situation with her, and the plan at this point will be for her  to follow-up with Dr. Andee Lineman next week in our [place] office to discuss the  matter further.  She would ultimately need bypass grafting with aortic valve  replacement and aneurysm repair.   INDICATIONS:      Jonelle Sidle, MD  Electronically Signed     SGM/MEDQ  D:  05/29/2006  T:  05/29/2006  Job:  [id number]   cc:   Learta Codding, MD,FACC

## 2011-04-01 ENCOUNTER — Ambulatory Visit (INDEPENDENT_AMBULATORY_CARE_PROVIDER_SITE_OTHER): Payer: Medicare Other | Admitting: *Deleted

## 2011-04-01 DIAGNOSIS — Z7901 Long term (current) use of anticoagulants: Secondary | ICD-10-CM

## 2011-04-01 DIAGNOSIS — Z954 Presence of other heart-valve replacement: Secondary | ICD-10-CM

## 2011-04-01 DIAGNOSIS — I4891 Unspecified atrial fibrillation: Secondary | ICD-10-CM

## 2011-04-04 ENCOUNTER — Ambulatory Visit (INDEPENDENT_AMBULATORY_CARE_PROVIDER_SITE_OTHER): Payer: Medicare Other | Admitting: Internal Medicine

## 2011-04-04 ENCOUNTER — Encounter: Payer: Self-pay | Admitting: Internal Medicine

## 2011-04-04 DIAGNOSIS — K219 Gastro-esophageal reflux disease without esophagitis: Secondary | ICD-10-CM

## 2011-04-04 DIAGNOSIS — D3A029 Benign carcinoid tumor of the large intestine, unspecified portion: Secondary | ICD-10-CM

## 2011-04-04 DIAGNOSIS — R1319 Other dysphagia: Secondary | ICD-10-CM

## 2011-04-06 ENCOUNTER — Encounter: Payer: Self-pay | Admitting: Internal Medicine

## 2011-04-06 DIAGNOSIS — D3A029 Benign carcinoid tumor of the large intestine, unspecified portion: Secondary | ICD-10-CM | POA: Insufficient documentation

## 2011-04-06 NOTE — Assessment & Plan Note (Signed)
See comments under GERD 

## 2011-04-06 NOTE — Progress Notes (Signed)
Primary Care Physician:  Alcide Evener, DO  Primary Gastroenterologist:    Chief Complaint  Patient presents with  . Follow-up    HPI:  [name] is a 60 y.o. female here with GERD and history of peptic stricture last dilated in December 2011. Reflux symptoms well controlled on pantoprazole. Only has rare breakthrough symptoms. Dysphagia much better since her stricture was dilated some 6 months ago now. No abdominal pain patient reports normal bowel function. She denies melena hematochezia constipation or diarrhea. History of carcinoid tumor for colon back in 2007. She's due for surveillance colonoscopy 2013.   Past Medical History  Diagnosis Date  . Hyperlipidemia   . CAD (coronary artery disease)     ARTERY BYPASS GRAFT  . Atrial fibrillation   . Macromastia   . Anxiety disorder   . GERD (gastroesophageal reflux disease)   . RLS (restless legs syndrome)   . Peptic stricture of esophagus 10/04/2010    At the GE junction on last EGD by Dr. Jena Gauss, benign biopsies  . History of colonoscopy 2003    Dr. Elmer Ramp  . 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 Coronary artery bypass graft   . Appendectomy   . Teeth removal   . Back surgery   . Cesarean section   . Abdominal hysterectomy   . Foot surgery   . Breast cyst removed   . Breast reduction surgery   . Colon surgery 01/2006    Secondary carcinoid  . Laparotomy 2007    small bowel resection secondary to small bowel obstruction  . Aortic valve replacement 2007    Current Outpatient Prescriptions  Medication Sig Dispense Refill  . ALPRAZolam (XANAX XR) 1 MG 24 hr tablet Take 1&1/2 by mouth in morning and 1/2 at bedtime      . cetirizine (ZYRTEC) 10 MG tablet Take 10 mg by mouth daily.        . Cholecalciferol (VITAMIN D3) 10000 UNITS capsule Take 10,000 Units by mouth once a week.        . cyclobenzaprine (FLEXERIL) 10 MG tablet Take 10 mg by mouth as needed.        Marland Kitchen  HYDROcodone-acetaminophen (LORCET) 10-650 MG per tablet Take 1 tablet by mouth at bedtime.        . magnesium chloride (SLOW-MAG) 64 MG TBEC Take 1 tablet by mouth 2 (two) times daily.        . metoprolol succinate (TOPROL-XL) 25 MG 24 hr tablet Take 25 mg by mouth daily.        . multivitamin (THERAGRAN) per tablet Take 1 tablet by mouth daily.        . pantoprazole (PROTONIX) 40 MG tablet Take 40 mg by mouth 2 (two) times daily. TAKE 2 tablets 30 MIN BEFORE BREAKFAST DAILY       . ramipril (ALTACE) 2.5 MG capsule Take 2.5 mg by mouth daily.       Marland Kitchen rOPINIRole (REQUIP) 2 MG tablet Take 2 mg by mouth at bedtime.        . rosuvastatin (CRESTOR) 10 MG tablet Take 10 mg by mouth daily.       . sertraline (ZOLOFT) 50 MG tablet Take 50 mg by mouth daily.        . temazepam (RESTORIL) 30 MG capsule Take 30 mg by mouth at bedtime as needed.        Marland Kitchen UNKNOWN TO PATIENT otc iron supplements unknown dosage       .  warfarin (COUMADIN) 2 MG tablet Take 2 mg by mouth daily. USE AS DIRECTED BY ANTICOAGUALTION CLINIC        Allergies as of 04/04/2011 - Review Complete 04/04/2011  Allergen Reaction Noted  . Penicillins  06/05/2009    Family History  Problem Relation Age of Onset  . Stroke Mother   . Cancer Father     History   Social History  . Marital Status: Married    Spouse Name: N/A    Number of Children: N/A  . Years of Education: N/A   Occupational History  . Not on file.   Social History Main Topics  . Smoking status: Former Smoker -- 1.0 packs/day for 40 years    Types: Cigarettes    Quit date: 10/20/1988  . Smokeless tobacco: Never Used  . Alcohol Use: No  . Drug Use: No  . Sexually Active: Yes -- Female partner(s)    Birth Control/ Protection: None     spouse   Other Topics Concern  . Not on file   Social History Narrative  . No narrative on file      ROS:  General: Negative for anorexia, weight loss, fever, chills, fatigue, weakness. Eyes: Negative for vision  changes.  ENT: Negative for hoarseness, difficulty swallowing , nasal congestion. CV: Negative for chest pain, angina, palpitations, dyspnea on exertion, peripheral edema.  Respiratory: Negative for dyspnea at rest, dyspnea on exertion, cough, sputum, wheezing.  GI: See history of present illness. GU:  Negative for dysuria, hematuria, urinary incontinence, urinary frequency, nocturnal urination.  MS: Negative for joint pain, low back pain.  Derm: Negative for rash or itching.  Neuro: Negative for weakness, abnormal sensation, seizure, frequent headaches, memory loss, confusion.  Psych: Negative for anxiety, depression, suicidal ideation, hallucinations.  Endo: Negative for unusual weight change.  Heme: Negative for bruising or bleeding. Allergy: Negative for rash or hives.    Physical Examination: BP 110/71  Pulse 68  Temp(Src) 97.2 F (36.2 C) (Temporal)  Ht 5\' 8"  (1.727 m)  Wt 162 lb (73.483 kg)  BMI 24.63 kg/m2   General: Well-nourished, well-developed in no acute distress.  Head: Normocephalic, atraumatic.   Eyes: Conjunctiva pink, no icterus. Mouth: Oropharyngeal mucosa moist and pink , no lesions erythema or exudate. Neck: Supple without thyromegaly, masses, or lymphadenopathy.  Lungs: Clear to auscultation bilaterally.  Heart: Regular rate and rhythm, no murmurs rubs or gallops.  Abdomen: Bowel sounds are normal, nontender, nondistended, no hepatosplenomegaly or masses, no abdominal bruits or    hernia , no rebound or guarding.   Extremities: No lower extremity edema.  Neuro: Alert and oriented x 4 , grossly normal neurologically.  Skin: Warm and dry, no rash or jaundice.   Psych: Alert and cooperative, normal mood and affect.

## 2011-04-06 NOTE — Assessment & Plan Note (Signed)
Well-controlled on protonix. 40 mg orally daily. Reviewed antireflux lifestyle/. No dysphagia this time. However I told this nice lady she may develop recurrent esophageal dysphagia for a stricture tightens up again in the future. She may or may not need any esophageal dilation at some point in time.  She is to continue protonix 40 mg orally daily; will plan to see this nice lady back in the office in one year. She will be due for a surveillance colonoscopy in 2013 as well.[name]

## 2011-04-18 ENCOUNTER — Ambulatory Visit: Payer: Medicare Other | Admitting: Internal Medicine

## 2011-04-18 ENCOUNTER — Ambulatory Visit (INDEPENDENT_AMBULATORY_CARE_PROVIDER_SITE_OTHER): Payer: Medicare Other | Admitting: *Deleted

## 2011-04-18 DIAGNOSIS — I4891 Unspecified atrial fibrillation: Secondary | ICD-10-CM

## 2011-04-18 DIAGNOSIS — Z7901 Long term (current) use of anticoagulants: Secondary | ICD-10-CM

## 2011-04-18 DIAGNOSIS — Z954 Presence of other heart-valve replacement: Secondary | ICD-10-CM

## 2011-05-23 ENCOUNTER — Ambulatory Visit (INDEPENDENT_AMBULATORY_CARE_PROVIDER_SITE_OTHER): Payer: Medicare Other | Admitting: *Deleted

## 2011-05-23 ENCOUNTER — Telehealth: Payer: Self-pay | Admitting: *Deleted

## 2011-05-23 DIAGNOSIS — Z954 Presence of other heart-valve replacement: Secondary | ICD-10-CM

## 2011-05-23 DIAGNOSIS — I4891 Unspecified atrial fibrillation: Secondary | ICD-10-CM

## 2011-05-23 DIAGNOSIS — Z7901 Long term (current) use of anticoagulants: Secondary | ICD-10-CM

## 2011-05-23 LAB — POCT INR: INR: 2.6

## 2011-05-23 NOTE — Telephone Encounter (Signed)
Patient requesting note stating that she can not push buggies in from outside at her job [employer]).  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  Does not think she should be doing this in 103 degree heat with cardiac condition.

## 2011-06-10 NOTE — Telephone Encounter (Signed)
Agree. Patient is status post aortic valve replacement [place] procedure she is not supposed to do any heavy lifting or pushing and certainly not enough 100 weather.

## 2011-06-12 ENCOUNTER — Encounter: Payer: Self-pay | Admitting: *Deleted

## 2011-06-12 NOTE — Telephone Encounter (Signed)
Will mail note to patient.

## 2011-06-24 ENCOUNTER — Ambulatory Visit (INDEPENDENT_AMBULATORY_CARE_PROVIDER_SITE_OTHER): Payer: Medicare Other | Admitting: *Deleted

## 2011-06-24 DIAGNOSIS — I4891 Unspecified atrial fibrillation: Secondary | ICD-10-CM

## 2011-06-24 DIAGNOSIS — Z954 Presence of other heart-valve replacement: Secondary | ICD-10-CM

## 2011-06-24 DIAGNOSIS — Z7901 Long term (current) use of anticoagulants: Secondary | ICD-10-CM

## 2011-06-24 LAB — POCT INR: INR: 2.8

## 2011-07-22 ENCOUNTER — Ambulatory Visit (INDEPENDENT_AMBULATORY_CARE_PROVIDER_SITE_OTHER): Payer: Medicare Other | Admitting: *Deleted

## 2011-07-22 DIAGNOSIS — I4891 Unspecified atrial fibrillation: Secondary | ICD-10-CM

## 2011-07-22 DIAGNOSIS — Z954 Presence of other heart-valve replacement: Secondary | ICD-10-CM

## 2011-07-22 DIAGNOSIS — Z7901 Long term (current) use of anticoagulants: Secondary | ICD-10-CM

## 2011-07-22 LAB — POCT INR: INR: 2.9

## 2011-07-25 ENCOUNTER — Encounter: Payer: Medicare Other | Admitting: *Deleted

## 2011-08-26 ENCOUNTER — Ambulatory Visit (INDEPENDENT_AMBULATORY_CARE_PROVIDER_SITE_OTHER): Payer: Medicare Other | Admitting: Cardiology

## 2011-08-26 ENCOUNTER — Ambulatory Visit (INDEPENDENT_AMBULATORY_CARE_PROVIDER_SITE_OTHER): Payer: Medicare Other | Admitting: *Deleted

## 2011-08-26 ENCOUNTER — Encounter: Payer: Self-pay | Admitting: Cardiology

## 2011-08-26 VITALS — BP 122/78 | HR 61 | Ht 67.0 in | Wt 168.0 lb

## 2011-08-26 DIAGNOSIS — I25810 Atherosclerosis of coronary artery bypass graft(s) without angina pectoris: Secondary | ICD-10-CM

## 2011-08-26 DIAGNOSIS — Z7901 Long term (current) use of anticoagulants: Secondary | ICD-10-CM

## 2011-08-26 DIAGNOSIS — Z954 Presence of other heart-valve replacement: Secondary | ICD-10-CM

## 2011-08-26 DIAGNOSIS — I872 Venous insufficiency (chronic) (peripheral): Secondary | ICD-10-CM

## 2011-08-26 DIAGNOSIS — E785 Hyperlipidemia, unspecified: Secondary | ICD-10-CM

## 2011-08-26 DIAGNOSIS — I359 Nonrheumatic aortic valve disorder, unspecified: Secondary | ICD-10-CM

## 2011-08-26 DIAGNOSIS — I4891 Unspecified atrial fibrillation: Secondary | ICD-10-CM

## 2011-08-26 NOTE — Patient Instructions (Signed)
Your physician wants you to follow-up in: 6 months. You will receive a reminder letter in the mail one-two months in advance. If you don't receive a letter, please call our office to schedule the follow-up appointment. Your physician recommends that you continue on your current medications as directed. Please refer to the Current Medication list given to you today. Your physician has requested that you have an echocardiogram. Echocardiography is a painless test that uses sound waves to create images of your heart. It provides your doctor with information about the size and shape of your heart and how well your heart's chambers and valves are working. This procedure takes approximately one hour. There are no restrictions for this procedure. DO ECHO IN 1 MONTH!  If the results of your test are normal or stable, you will receive a letter. If they are abnormal, the nurse will contact you by phone.

## 2011-08-26 NOTE — Assessment & Plan Note (Signed)
Followup echocardiogram scheduled. No definite valve dysfunction by physical examination

## 2011-08-26 NOTE — Assessment & Plan Note (Signed)
Follow by primary care physician 

## 2011-08-26 NOTE — Progress Notes (Signed)
CC: Followup as aortic valve replacement  HPI:  The patient is a 60 year old female with a history of aortic valve replacement with St. Jude prosthesis. She status post coronary bypass grafting. She is on chronic Coumadin therapy with no complications. The patient denies social chest pain, shortness of breath orthopnea PND. She has no definite cardiovascular complaints. She is recovering a recent bout of chronic sinusitis. The patient also had recent esophageal dilatation. Her dysphagia is much improved.  PMH: reviewed and listed in Problem List in Electronic Records (and see below)  Allergies/SH/FHX : available in Electronic Records for review  Medications: Current Outpatient Prescriptions  Medication Sig Dispense Refill  . ALPRAZolam (XANAX XR) 1 MG 24 hr tablet Take 1&1/2 by mouth in morning and 1/2 at bedtime      . cetirizine (ZYRTEC) 10 MG tablet Take 10 mg by mouth daily.        . cyclobenzaprine (FLEXERIL) 10 MG tablet Take 10 mg by mouth 3 (three) times daily as needed.       Marland Kitchen HYDROcodone-acetaminophen (LORCET) 10-650 MG per tablet Take 1 tablet by mouth at bedtime.        . magnesium 30 MG tablet Take 30 mg by mouth daily.        . metoprolol succinate (TOPROL-XL) 25 MG 24 hr tablet Take 25 mg by mouth daily.        . multivitamin (THERAGRAN) per tablet Take 1 tablet by mouth daily.        . pantoprazole (PROTONIX) 40 MG tablet TAKE 2 tablets 30 MIN BEFORE BREAKFAST DAILY      . ramipril (ALTACE) 2.5 MG capsule Take 2.5 mg by mouth daily.       Marland Kitchen rOPINIRole (REQUIP) 2 MG tablet Take 2 mg by mouth at bedtime.        . rosuvastatin (CRESTOR) 10 MG tablet Take 10 mg by mouth daily.       . sertraline (ZOLOFT) 50 MG tablet Take 50 mg by mouth daily.        . temazepam (RESTORIL) 30 MG capsule Take 30 mg by mouth at bedtime as needed.        . vitamin B-12 (CYANOCOBALAMIN) 100 MCG tablet Take 100 mcg by mouth daily.        . vitamin E 400 UNIT capsule Take 400 Units by mouth daily.         Marland Kitchen warfarin (COUMADIN) 2 MG tablet Take 2 mg by mouth daily. USE AS DIRECTED BY ANTICOAGUALTION CLINIC        ROS: No nausea or vomiting. No fever or chills.No melena or hematochezia.No bleeding.No claudication. No exertional symptoms of chest pain or shortness of breath. Nocturnal cramps  Physical Exam: BP 122/78  Pulse 61  Ht 5\' 7"  (1.702 m)  Wt 168 lb (76.204 kg)  BMI 26.31 kg/m2 General: Well-nourished white female in no apparent distress Neck: Normal carotid upstroke no carotid bruits. No thyromegaly nonnodular thyroid. Lungs: Clear breath sounds bilaterally no wheezing. Cardiac: Regular rate and rhythm with normal S1-normal closing click of the second heart sound with no murmur rubs or gallops. Vascular: No edema. Normal dorsalis pedis and posterior tibial pulses. Skin: Warm and dry. Extremities: Chronic venous insufficiency with multiple spider neivi.  12lead ECG: Normal sinus rhythm nonspecific ST-T wave changes Limited bedside ECHO:N/A   Assessment and Plan

## 2011-08-26 NOTE — Assessment & Plan Note (Signed)
No recurrent chest pain. Continue medical therapy and risk factor modification 

## 2011-08-26 NOTE — Assessment & Plan Note (Signed)
Patient should try to stay off her feet as much as possible. Elevate legs in the evening. Also given a referral to consider "podium legs".

## 2011-09-30 ENCOUNTER — Encounter: Payer: Medicare Other | Admitting: *Deleted

## 2011-10-07 ENCOUNTER — Ambulatory Visit (INDEPENDENT_AMBULATORY_CARE_PROVIDER_SITE_OTHER): Payer: Medicare Other | Admitting: *Deleted

## 2011-10-07 DIAGNOSIS — Z7901 Long term (current) use of anticoagulants: Secondary | ICD-10-CM

## 2011-10-07 DIAGNOSIS — Z954 Presence of other heart-valve replacement: Secondary | ICD-10-CM

## 2011-10-07 DIAGNOSIS — I4891 Unspecified atrial fibrillation: Secondary | ICD-10-CM

## 2011-10-07 LAB — POCT INR: INR: 2.6

## 2011-10-16 ENCOUNTER — Other Ambulatory Visit (INDEPENDENT_AMBULATORY_CARE_PROVIDER_SITE_OTHER): Payer: Medicare Other | Admitting: *Deleted

## 2011-10-16 DIAGNOSIS — I359 Nonrheumatic aortic valve disorder, unspecified: Secondary | ICD-10-CM

## 2011-11-06 ENCOUNTER — Encounter: Payer: Self-pay | Admitting: Internal Medicine

## 2011-11-18 ENCOUNTER — Ambulatory Visit (INDEPENDENT_AMBULATORY_CARE_PROVIDER_SITE_OTHER): Payer: Medicare Other | Admitting: *Deleted

## 2011-11-18 DIAGNOSIS — Z954 Presence of other heart-valve replacement: Secondary | ICD-10-CM

## 2011-11-18 DIAGNOSIS — I4891 Unspecified atrial fibrillation: Secondary | ICD-10-CM

## 2011-11-18 DIAGNOSIS — Z7901 Long term (current) use of anticoagulants: Secondary | ICD-10-CM

## 2011-11-18 LAB — POCT INR: INR: 3

## 2011-11-20 ENCOUNTER — Encounter: Payer: Self-pay | Admitting: Internal Medicine

## 2011-12-30 ENCOUNTER — Ambulatory Visit (INDEPENDENT_AMBULATORY_CARE_PROVIDER_SITE_OTHER): Payer: Medicare Other | Admitting: *Deleted

## 2011-12-30 DIAGNOSIS — I4891 Unspecified atrial fibrillation: Secondary | ICD-10-CM

## 2011-12-30 DIAGNOSIS — Z7901 Long term (current) use of anticoagulants: Secondary | ICD-10-CM

## 2011-12-30 DIAGNOSIS — Z954 Presence of other heart-valve replacement: Secondary | ICD-10-CM

## 2012-01-09 ENCOUNTER — Ambulatory Visit (INDEPENDENT_AMBULATORY_CARE_PROVIDER_SITE_OTHER): Payer: Medicare Other | Admitting: Internal Medicine

## 2012-01-09 ENCOUNTER — Encounter: Payer: Self-pay | Admitting: Internal Medicine

## 2012-01-09 ENCOUNTER — Telehealth: Payer: Self-pay

## 2012-01-09 DIAGNOSIS — R1310 Dysphagia, unspecified: Secondary | ICD-10-CM

## 2012-01-09 DIAGNOSIS — Z809 Family history of malignant neoplasm, unspecified: Secondary | ICD-10-CM

## 2012-01-09 DIAGNOSIS — D3A029 Benign carcinoid tumor of the large intestine, unspecified portion: Secondary | ICD-10-CM

## 2012-01-09 NOTE — Assessment & Plan Note (Signed)
Pleasant 61 year old lady here for a setting of surveillance colonoscopy. History of appendiceal carcinoid resected in 2007. Also has recurrent esophageal dysphagia in the setting of a known Schatzki's ring She had difficulty with sedation time before last. She did very well propofol in the OR last time.  I have  offered this lady an EGD with esophageal dilation and a surveillance colonoscopy at the same time in the near future at the hospital.The risks, benefits, limitations, imponderables and alternatives regarding both EGD and colonoscopy have been reviewed with the patient. Questions have been answered. All parties agreeable.  Will plan to bridge her with Lovenox, holding the Coumadin for 4 days prior to the procedure in consultation with Dr.DeGents office.

## 2012-01-09 NOTE — Progress Notes (Signed)
Primary Care Physician:  Samuel Jester DO, DO Primary Gastroenterologist:  Dr.   Pre-Procedure History & Physical: HPI:  [name] is a 61 y.o. female here for   Past Medical History  Diagnosis Date  . Hyperlipidemia   . CAD (coronary artery disease)     ARTERY BYPASS GRAFT  . Atrial fibrillation   . Macromastia   . Anxiety disorder   . GERD (gastroesophageal reflux disease)   . RLS (restless legs syndrome)   . Peptic stricture of esophagus 10/04/2010    At the GE junction on last EGD by Dr. Jena Gauss, benign biopsies  . History of colonoscopy 2003    Dr. Elmer Ramp  . Carcinoid tumor 01/2006  . Schatzki's ring   . Carcinoid tumor of colon 2007    Past Surgical History  Procedure Date  . Coronary artery bypass graft   . Appendectomy   . Teeth removal   . Back surgery   . Cesarean section   . Abdominal hysterectomy   . Foot surgery   . Breast cyst removed   . Breast reduction surgery   . Colon surgery 01/2006    Secondary carcinoid  . Laparotomy 2007    small bowel resection secondary to small bowel obstruction  . Aortic valve replacement 2007  . Esophagogastroduodenoscopy 10/03/2010    Dr. Jena Gauss- schatzki's ring, shoft peptic stricture at GE junction.    Prior to Admission medications   Medication Sig Start Date End Date Taking? Authorizing Provider  ALPRAZolam (XANAX XR) 1 MG 24 hr tablet Take 1&1/2 by mouth in morning and 1/2 at bedtime   Yes Historical Provider, MD  cetirizine (ZYRTEC) 10 MG tablet Take 10 mg by mouth daily.     Yes Historical Provider, MD  cyclobenzaprine (FLEXERIL) 10 MG tablet Take 10 mg by mouth 3 (three) times daily as needed.    Yes Historical Provider, MD  HYDROcodone-acetaminophen (LORCET) 10-650 MG per tablet Take 1 tablet by mouth at bedtime.     Yes Historical Provider, MD  magnesium 30 MG tablet Take 30 mg by mouth daily.     Yes Historical Provider, MD  metoprolol succinate (TOPROL-XL) 25 MG 24 hr tablet Take 25 mg by mouth  daily.     Yes Historical Provider, MD  multivitamin [facility]) per tablet Take 1 tablet by mouth daily.     Yes Historical Provider, MD  pantoprazole (PROTONIX) 40 MG tablet TAKE 2 tablets 30 MIN BEFORE BREAKFAST DAILY 01/21/11  Yes Joselyn Arrow, NP  ramipril (ALTACE) 2.5 MG capsule Take 2.5 mg by mouth daily.    Yes Historical Provider, MD  rOPINIRole (REQUIP) 2 MG tablet Take 2 mg by mouth at bedtime.     Yes Historical Provider, MD  rosuvastatin (CRESTOR) 10 MG tablet Take 10 mg by mouth daily.    Yes Historical Provider, MD  sertraline (ZOLOFT) 50 MG tablet Take 50 mg by mouth daily.     Yes Historical Provider, MD  temazepam (RESTORIL) 30 MG capsule Take 30 mg by mouth at bedtime as needed.     Yes Historical Provider, MD  warfarin (COUMADIN) 2 MG tablet Take 2 mg by mouth daily. USE AS DIRECTED BY ANTICOAGUALTION CLINIC   Yes Historical Provider, MD  vitamin B-12 (CYANOCOBALAMIN) 100 MCG tablet Take 100 mcg by mouth daily.      Historical Provider, MD  vitamin E 400 UNIT capsule Take 400 Units by mouth daily.      Historical Provider, MD    Allergies  as of 01/09/2012 - Review Complete 01/09/2012  Allergen Reaction Noted  . Penicillins  06/05/2009    Family History  Problem Relation Age of Onset  . Stroke Mother   . Cancer Father     History   Social History  . Marital Status: Married    Spouse Name: N/A    Number of Children: N/A  . Years of Education: N/A   Occupational History  . Not on file.   Social History Main Topics  . Smoking status: Former Smoker -- 1.0 packs/day for 20 years    Types: Cigarettes    Quit date: 10/20/1988  . Smokeless tobacco: Never Used  . Alcohol Use: No  . Drug Use: No  . Sexually Active: Yes -- Female partner(s)    Birth Control/ Protection: None     spouse   Other Topics Concern  . Not on file   Social History Narrative  . No narrative on file    Review of Systems: See HPI, otherwise negative ROS  Physical Exam: BP 105/69   Pulse 65  Temp(Src) 98.2 F (36.8 C) (Temporal)  Ht 5\' 7"  (1.702 m)  Wt 170 lb 6.4 oz (77.293 kg)  BMI 26.69 kg/m2 General:   Alert,  Well-developed, well-nourished, pleasant and cooperative in NAD Skin:  Intact without significant lesions or rashes. Eyes:  Sclera clear, no icterus.   Conjunctiva pink. Ears:  Normal auditory acuity. Nose:  No deformity, discharge,  or lesions. Mouth:  No deformity or lesions. Neck:  Supple; no masses or thyromegaly. No significant cervical adenopathy. Lungs:  Clear throughout to auscultation.   No wheezes, crackles, or rhonchi. No acute distress. Heart:  Regular rate and rhythm; no murmurs, clicks, rubs,  or gallops. Abdomen: Non-distended, normal bowel sounds.  Soft and nontender without appreciable mass or hepatosplenomegaly.  Pulses:  Normal pulses noted. Extremities:  Without clubbing or edema.  Impression/Plan:

## 2012-01-09 NOTE — Telephone Encounter (Signed)
Dr. Andee Lineman,   [name] was seen in our office today for dysphagia, she will need to have an egd w/ed and a colonoscopy for her history of carcinoid tumors in her colon. Is it ok to stop her coumadin for 4 days and bridge her with lovenox? Please advise.

## 2012-01-09 NOTE — Telephone Encounter (Signed)
Please advise 

## 2012-01-09 NOTE — Patient Instructions (Signed)
EGD with esophageal dilation and colonoscopy to be set up at the hospital in the near future.    We'll need to stop Coumadin 4 days before the procedure and "bridge" with Lovenox  in consultation with Dr. Margarita Mail office.  No antibiotics needed.

## 2012-01-12 NOTE — Telephone Encounter (Signed)
Reviewed guidelines with St Jude aortic valve she does not need bridging per se although we did this in past. I suggest to stop coumadin for 4 days before procedure without bridging, but restart coumadin immediately afterwards with bridging until therapeutic on coumadin again.

## 2012-01-13 NOTE — Telephone Encounter (Signed)
I spoke with Dr. Jena Gauss, he would appreciate it if you would handle her bridge. Thanks.

## 2012-01-13 NOTE — Telephone Encounter (Signed)
Do you want to bridge her or want me to?

## 2012-01-15 NOTE — Telephone Encounter (Signed)
Pt is scheduled for her procedures on 01/29/2012.

## 2012-01-19 ENCOUNTER — Other Ambulatory Visit: Payer: Self-pay | Admitting: *Deleted

## 2012-01-19 ENCOUNTER — Encounter: Payer: Self-pay | Admitting: Gastroenterology

## 2012-01-19 ENCOUNTER — Encounter: Payer: Self-pay | Admitting: *Deleted

## 2012-01-19 DIAGNOSIS — Z954 Presence of other heart-valve replacement: Secondary | ICD-10-CM

## 2012-01-19 DIAGNOSIS — I4891 Unspecified atrial fibrillation: Secondary | ICD-10-CM

## 2012-01-19 DIAGNOSIS — Z7901 Long term (current) use of anticoagulants: Secondary | ICD-10-CM

## 2012-01-19 MED ORDER — ENOXAPARIN SODIUM 120 MG/0.8ML ~~LOC~~ SOLN: 110.0000 mg | Freq: Every day | SUBCUTANEOUS | Status: DC

## 2012-01-19 NOTE — Telephone Encounter (Signed)
Spoke with pt.  She is scheduled for colonoscopy by Dr Jena Gauss 01/29/12.  Per Dr Andee Lineman pt can hold coumadin 4 days prior to procedure but will need lovenox bridge after procedure till INR >/= 2.0.  She will take last dose of coumadin 01/24/12.  She will restart coumadin the night of procedure @ 4mg  daily x 3 then resume regular dose.  She will start Lovenox 110mg  sq q am on 01/30/12.  INR check scheduled for 02/03/12.  Rx for lovenox sent ot [facility].  Pt has administered Lovenox before and verbalized understanding of instructions given.

## 2012-01-19 NOTE — Progress Notes (Signed)
This encounter was created in error - please disregard.

## 2012-01-19 NOTE — Telephone Encounter (Signed)
Forward to Crystal 

## 2012-01-20 ENCOUNTER — Other Ambulatory Visit: Payer: Self-pay | Admitting: Gastroenterology

## 2012-01-20 MED ORDER — PEG-KCL-NACL-NASULF-NA ASC-C 100 G PO SOLR: 1.0000 | Freq: Once | ORAL | Status: DC

## 2012-01-29 ENCOUNTER — Encounter (HOSPITAL_COMMUNITY): Payer: Self-pay

## 2012-01-29 ENCOUNTER — Encounter (HOSPITAL_COMMUNITY): Payer: Self-pay | Admitting: Pharmacy Technician

## 2012-01-29 ENCOUNTER — Other Ambulatory Visit: Payer: Self-pay

## 2012-01-29 ENCOUNTER — Encounter (HOSPITAL_COMMUNITY)
Admission: RE | Admit: 2012-01-29 | Discharge: 2012-01-29 | Disposition: A | Discharge status: Home / Self Care | Payer: MEDICARE | Source: Ambulatory Visit | Attending: Internal Medicine | Admitting: Internal Medicine

## 2012-01-29 LAB — BASIC METABOLIC PANEL
BUN: 16 mg/dL (ref 6–23)
Chloride: 105 mEq/L (ref 96–112)
Creatinine, Ser: 1.13 mg/dL — ABNORMAL HIGH (ref 0.50–1.10)
GFR calc Af Amer: 60 mL/min — ABNORMAL LOW (ref >90)
GFR calc non Af Amer: 52 mL/min — ABNORMAL LOW (ref >90)
Glucose, Bld: 66 mg/dL — ABNORMAL LOW (ref 70–99)
Potassium: 4.3 mEq/L (ref 3.5–5.1)

## 2012-01-29 NOTE — Patient Instructions (Addendum)
[id number] [name]  01/29/2012   Your procedure is scheduled on:   02/05/2012  Report to [facility] at  615  AM.  Call this number if you have problems the morning of surgery: [phone]   Remember:   Do not eat food:After Midnight.  May have clear liquids:until Midnight .  Clear liquids include soda, tea, black coffee, apple or grape juice, broth.  Take these medicines the morning of surgery with A SIP OF WATER: xanax,zyrtec,flexaril,lorcet,toprol,protonix,zoloft   Do not wear jewelry, make-up or nail polish.  Do not wear lotions, powders, or perfumes. You may wear deodorant.  Do not shave 48 hours prior to surgery.  Do not bring valuables to the hospital.  Contacts, dentures or bridgework may not be worn into surgery.  Leave suitcase in the car. After surgery it may be brought to your room.  For patients admitted to the hospital, checkout time is 11:00 AM the day of discharge.   Patients discharged the day of surgery will not be allowed to drive home.  Name and phone number of your driver: family  Special Instructions: N/A   Please read over the following fact sheets that you were given: Pain Booklet, Surgical Site Infection Prevention, Anesthesia Post-op Instructions and Care and Recovery After Surgery Esophageal Dilatation The esophagus is the long, narrow tube which carries food and liquid from the mouth to the stomach. Esophageal dilatation is the technique used to stretch a blocked or narrowed portion of the esophagus. This procedure is used when a part of the esophagus has become so narrow that it becomes difficult, painful or even impossible to swallow. This is generally an uncomplicated form of treatment. When this is not successful, chest surgery may be required. This is a much more extensive form of treatment with a longer recovery time. CAUSES  Some of the more common causes of blockage or strictures of the esophagus are:  Narrowing from longstanding inflammation  (soreness and redness) of the lower esophagus. This comes from the constant exposure of the lower esophagus to the acid which bubbles up from the stomach. Over time this causes scarring and narrowing of the lower esophagus.   Hiatal hernia in which a small part of the stomach bulges (herniates) up through the diaphragm. This can cause a gradual narrowing of the end of the esophagus.   Schatzki's Ring is a narrow ring of benign (non-cancerous) fibrous tissue which constricts the lower esophagus. The reason for this is not known.   Scleroderma is a connective tissue disorder that affects the esophagus and makes swallowing difficult.   Achalasia is an absence of nerves to the lower esophagus and to the esophageal sphincter. This is the circular muscle between the stomach and esophagus that relaxes to allow food into the stomach. After swallowing, it contracts to keep food in the stomach. This absence of nerves may be congenital (present since birth). This can cause irregular spasms of the lower esophageal muscle. This spasm does not open up to allow food and fluid through. The result is a persistent blockage with subsequent slow trickling of the esophageal contents into the stomach.   Strictures may develop from swallowing materials which damage the esophagus. Some examples are strong acids or alkalis such as lye.   Growths such as benign (non-cancerous) and malignant (cancerous) tumors can block the esophagus.   Heredity (present since birth) causes.  DIAGNOSIS  Your caregiver often suspects this problem by taking a medical history. They will also do a physical  exam. They can then prove their suspicions using X-rays and endoscopy. Endoscopy is an exam in which a tube like a small flexible telescope is used to look at your esophagus.  TREATMENT There are different stretching (dilating) techniques which can be used. Simple bougie dilatation may be done in the office. This usually takes only a couple  minutes. A numbing (anesthetic) spray of the throat is used. Endoscopy, when done, is done in an endoscopy suite, under mild sedation. When fluoroscopy is used, the procedure is performed in X-ray. Other techniques require a little longer time. Recovery is usually quick. There is no waiting time to begin eating and drinking to test success of the treatment. Following are some of the methods used. Narrowing of the esophagus is treated by making it bigger. Commonly this is a mechanical problem which can be treated with stretching. This can be done in different ways. Your caregiver will discuss these with you. Some of the means used are:  A series of graduated (increasing thickness) flexible dilators can be used. These are weighted tubes passed through the esophagus into the stomach. The tubes used become progressively larger until the desired stretched size is reached. Graduated dilators are a simple and quick way of opening the esophagus. No visualization is required.   Another method is the use of endoscopy to place a flexible wire across the stricture. The endoscope is removed and the wire left in place. A dilator with a hole through it from end to end is guided down the esophagus and across the stricture. One or more of these dilators are passed over the wire. At the end of the exam, the wire is removed. This type of treatment may be performed in the X-ray department under fluoroscopy. An advantage of this procedure is the examiner is visualizing the end opening in the esophagus.   Stretching of the esophagus may be done using balloons. Deflated balloons are placed through the endoscope and across the stricture. This type of balloon dilatation is often done at the time of endoscopy or fluoroscopy. Flexible endoscopy allows the examiner to directly view the stricture. A balloon is inserted in the deflated form into the area of narrowing. It is then inflated with air to a certain pressure that is pre-set for  a given circumference. When inflated, it becomes sausage shaped, stretched, and makes the stricture larger.   Achalasia requires a longer larger balloon-type dilator. This is frequently done under X-ray control. In this situation, the spastic muscle fibers in the lower esophagus are stretched.  All of the above procedures make the passage of food and water into the stomach easier. They also make it easier for stomach contents to reflux back into the esophagus. Special medications may be used following the procedure to help prevent further stricturing. Proton-pump inhibitor medications are good at decreasing the amount of acid in the stomach juice. When stomach juice refluxes into the esophagus, the juice is no longer as acidic and is less likely to burn or scar the esophagus. RISKS AND COMPLICATIONS Esophageal dilatation is usually performed effectively and without problems. Some complications that can occur are:  A small amount of bleeding almost always happens where the stretching takes place. If this is too excessive it may require more aggressive treatment.   An uncommon complication is perforation (making a hole) of the esophagus. The esophagus is thin. It is easy to make a hole in it. If this happens, an operation may be necessary to repair this.  A small, undetected perforation could lead to an infection in the chest. This can be very serious.  HOME CARE INSTRUCTIONS   If you received sedation for your procedure, do not drive, make important decisions, or perform any activities requiring your full coordination. Do not drink alcohol, take sedatives, or use any mind altering chemicals unless instructed by your caregiver.   You may use throat lozenges or warm salt water gargles if you have throat discomfort   You can begin eating and drinking normally on return home unless instructed otherwise. Do not purposely try to force large chunks of food down to test the benefits of your procedure.    Mild discomfort can be eased with sips of ice water.   Medications for discomfort may or may not be needed.  SEEK IMMEDIATE MEDICAL CARE IF:   You begin vomiting up blood.   You develop black tarry stools   You develop chills or an unexplained temperature of over 101 F (38.3 C)   You develop chest or abdominal pain.   You develop shortness of breath or feel lightheaded or faint.   Your swallowing is becoming more painful, difficult, or you are unable to swallow.  MAKE SURE YOU:   Understand these instructions.   Will watch your condition.   Will get help right away if you are not doing well or get worse.  Document Released: 11/27/2005 Document Revised: 09/25/2011 Document Reviewed: 01/14/2006 [facility] Patient Information 2012 [place], [place].Colonoscopy A colonoscopy is an exam to evaluate your entire colon. In this exam, your colon is cleansed. A long fiberoptic tube is inserted through your rectum and into your colon. The fiberoptic scope (endoscope) is a long bundle of enclosed and very flexible fibers. These fibers transmit light to the area examined and send images from that area to your caregiver. Discomfort is usually minimal. You may be given a drug to help you sleep (sedative) during or prior to the procedure. This exam helps to detect lumps (tumors), polyps, inflammation, and areas of bleeding. Your caregiver may also take a small piece of tissue (biopsy) that will be examined under a microscope. LET YOUR CAREGIVER KNOW ABOUT:   Allergies to food or medicine.   Medicines taken, including vitamins, herbs, eyedrops, over-the-counter medicines, and creams.   Use of steroids (by mouth or creams).   Previous problems with anesthetics or numbing medicines.   History of bleeding problems or blood clots.   Previous surgery.   Other health problems, including diabetes and kidney problems.   Possibility of pregnancy, if this applies.  BEFORE THE PROCEDURE   A clear  liquid diet may be required for 2 days before the exam.   Ask your caregiver about changing or stopping your regular medications.   Liquid injections (enemas) or laxatives may be required.   A large amount of electrolyte solution may be given to you to drink over a short period of time. This solution is used to clean out your colon.   You should be present 60 minutes prior to your procedure or as directed by your caregiver.  AFTER THE PROCEDURE   If you received a sedative or pain relieving medication, you will need to arrange for someone to drive you home.   Occasionally, there is a little blood passed with the first bowel movement. Do not be concerned.  FINDING OUT THE RESULTS OF YOUR TEST Not all test results are available during your visit. If your test results are not back during the visit, make  an appointment with your caregiver to find out the results. Do not assume everything is normal if you have not heard from your caregiver or the medical facility. It is important for you to follow up on all of your test results. HOME CARE INSTRUCTIONS   It is not unusual to pass moderate amounts of gas and experience mild abdominal cramping following the procedure. This is due to air being used to inflate your colon during the exam. Walking or a warm pack on your belly (abdomen) may help.   You may resume all normal meals and activities after sedatives and medicines have worn off.   Only take over-the-counter or prescription medicines for pain, discomfort, or fever as directed by your caregiver. Do not use aspirin or blood thinners if a biopsy was taken. Consult your caregiver for medicine usage if biopsies were taken.  SEEK IMMEDIATE MEDICAL CARE IF:   You have a fever.   You pass large blood clots or fill a toilet with blood following the procedure. This may also occur 10 to 14 days following the procedure. This is more likely if a biopsy was taken.   You develop abdominal pain that keeps  getting worse and cannot be relieved with medicine.  Document Released: 10/03/2000 Document Revised: 09/25/2011 Document Reviewed: 05/18/2008 [facility] Patient Information 2012 [place], [place].Esophagogastroduodenoscopy This is an endoscopic procedure (a procedure that uses a device like a flexible telescope) that allows your caregiver to view the upper stomach and small bowel. This test allows your caregiver to look at the esophagus. The esophagus carries food from your mouth to your stomach. They can also look at your duodenum. This is the first part of the small intestine that attaches to the stomach. This test is used to detect problems in the bowel such as ulcers and inflammation. PREPARATION FOR TEST Nothing to eat after midnight the day before the test. NORMAL FINDINGS Normal esophagus, stomach, and duodenum. Ranges for normal findings may vary among different laboratories and hospitals. You should always check with your doctor after having lab work or other tests done to discuss the meaning of your test results and whether your values are considered within normal limits. MEANING OF TEST  Your caregiver will go over the test results with you and discuss the importance and meaning of your results, as well as treatment options and the need for additional tests if necessary. OBTAINING THE TEST RESULTS It is your responsibility to obtain your test results. Ask the lab or department performing the test when and how you will get your results. Document Released: 02/06/2005 Document Revised: 09/25/2011 Document Reviewed: 09/15/2008 [facility] Patient Information 2012 [place], [place].PATIENT INSTRUCTIONS POST-ANESTHESIA  IMMEDIATELY FOLLOWING SURGERY:  Do not drive or operate machinery for the first twenty four hours after surgery.  Do not make any important decisions for twenty four hours after surgery or while taking narcotic pain medications or sedatives.  If you develop intractable nausea and  vomiting or a severe headache please notify your doctor immediately.  FOLLOW-UP:  Please make an appointment with your surgeon as instructed. You do not need to follow up with anesthesia unless specifically instructed to do so.  WOUND CARE INSTRUCTIONS (if applicable):  Keep a dry clean dressing on the anesthesia/puncture wound site if there is drainage.  Once the wound has quit draining you may leave it open to air.  Generally you should leave the bandage intact for twenty four hours unless there is drainage.  If the epidural site drains for more  than 36-48 hours please call the anesthesia department.  QUESTIONS?:  Please feel free to call your physician or the hospital operator if you have any questions, and they will be happy to assist you.     [facility] Anesthesia Department [address] [place] [phone]

## 2012-01-29 NOTE — Pre-Procedure Instructions (Addendum)
Patient has [employer] mechanical aortic valve per Dr [employer] office in [place]. They are also handling bridging patient from coumadin to lovenox for procedure. Pt is aware of this and already has Lovenox at home.

## 2012-02-04 ENCOUNTER — Telehealth: Payer: Self-pay | Admitting: *Deleted

## 2012-02-04 NOTE — Telephone Encounter (Signed)
Would like return phone call regarding Lovenox dosage. / tg

## 2012-02-04 NOTE — Telephone Encounter (Signed)
Pt wanted to know what to do if she has extra lovenox injections.  Told pt to keep them because she may not be theraputic @ INR check on 4/23.  Informed if she's not, she will have to stay on lovenox.

## 2012-02-04 NOTE — Telephone Encounter (Signed)
Left a long message on my voicemail asking about taking her coumadin and lovanox.  She is  Confused about what she should be taking.

## 2012-02-05 ENCOUNTER — Encounter (HOSPITAL_COMMUNITY)
Admission: RE | Disposition: A | Discharge status: Home / Self Care | Payer: Self-pay | Source: Ambulatory Visit | Attending: Internal Medicine

## 2012-02-05 ENCOUNTER — Encounter (HOSPITAL_COMMUNITY): Payer: Self-pay | Admitting: Anesthesiology

## 2012-02-05 ENCOUNTER — Ambulatory Visit (HOSPITAL_COMMUNITY)
Admission: RE | Admit: 2012-02-05 | Discharge: 2012-02-05 | Disposition: A | Discharge status: Home / Self Care | Payer: MEDICARE | Source: Ambulatory Visit | Attending: Internal Medicine | Admitting: Internal Medicine

## 2012-02-05 ENCOUNTER — Encounter (HOSPITAL_COMMUNITY): Payer: Self-pay | Admitting: *Deleted

## 2012-02-05 ENCOUNTER — Ambulatory Visit (HOSPITAL_COMMUNITY): Payer: MEDICARE | Admitting: Anesthesiology

## 2012-02-05 DIAGNOSIS — E785 Hyperlipidemia, unspecified: Secondary | ICD-10-CM | POA: Insufficient documentation

## 2012-02-05 DIAGNOSIS — Z01812 Encounter for preprocedural laboratory examination: Secondary | ICD-10-CM | POA: Insufficient documentation

## 2012-02-05 DIAGNOSIS — Z7901 Long term (current) use of anticoagulants: Secondary | ICD-10-CM | POA: Insufficient documentation

## 2012-02-05 DIAGNOSIS — Z951 Presence of aortocoronary bypass graft: Secondary | ICD-10-CM | POA: Insufficient documentation

## 2012-02-05 DIAGNOSIS — K449 Diaphragmatic hernia without obstruction or gangrene: Secondary | ICD-10-CM | POA: Insufficient documentation

## 2012-02-05 DIAGNOSIS — R1310 Dysphagia, unspecified: Secondary | ICD-10-CM

## 2012-02-05 DIAGNOSIS — K2289 Other specified disease of esophagus: Secondary | ICD-10-CM

## 2012-02-05 DIAGNOSIS — K2270 Barrett's esophagus without dysplasia: Secondary | ICD-10-CM | POA: Insufficient documentation

## 2012-02-05 DIAGNOSIS — K219 Gastro-esophageal reflux disease without esophagitis: Secondary | ICD-10-CM | POA: Insufficient documentation

## 2012-02-05 DIAGNOSIS — Z01810 Encounter for preprocedural cardiovascular examination: Secondary | ICD-10-CM | POA: Insufficient documentation

## 2012-02-05 DIAGNOSIS — K5730 Diverticulosis of large intestine without perforation or abscess without bleeding: Secondary | ICD-10-CM | POA: Insufficient documentation

## 2012-02-05 DIAGNOSIS — D126 Benign neoplasm of colon, unspecified: Secondary | ICD-10-CM | POA: Insufficient documentation

## 2012-02-05 DIAGNOSIS — K228 Other specified diseases of esophagus: Secondary | ICD-10-CM

## 2012-02-05 DIAGNOSIS — Z8719 Personal history of other diseases of the digestive system: Secondary | ICD-10-CM

## 2012-02-05 DIAGNOSIS — Z1211 Encounter for screening for malignant neoplasm of colon: Secondary | ICD-10-CM

## 2012-02-05 HISTORY — PX: MALONEY DILATION: SHX5535

## 2012-02-05 HISTORY — PX: BIOPSY: SHX5522

## 2012-02-05 HISTORY — PX: COLONOSCOPY: SHX174

## 2012-02-05 LAB — PROTIME-INR
INR: 1.22 (ref 0.00–1.49)
Prothrombin Time: 15.7 seconds — ABNORMAL HIGH (ref 11.6–15.2)

## 2012-02-05 SURGERY — COLONOSCOPY WITH PROPOFOL
Anesthesia: Monitor Anesthesia Care

## 2012-02-05 MED ORDER — GLYCOPYRROLATE 0.2 MG/ML IJ SOLN
INTRAMUSCULAR | Status: AC
  Administered 2012-02-05: 0.2 mg via INTRAVENOUS
  Filled 2012-02-05: qty 1

## 2012-02-05 MED ORDER — LACTATED RINGERS IV SOLN
INTRAVENOUS | Status: DC
  Administered 2012-02-05: 1000 mL via INTRAVENOUS

## 2012-02-05 MED ORDER — PROPOFOL 10 MG/ML IV EMUL
INTRAVENOUS | Status: AC
  Filled 2012-02-05: qty 20

## 2012-02-05 MED ORDER — STERILE WATER FOR IRRIGATION IR SOLN
Status: DC | PRN
  Administered 2012-02-05: 07:00:00

## 2012-02-05 MED ORDER — BUTAMBEN-TETRACAINE-BENZOCAINE 2-2-14 % EX AERO
1.0000 | INHALATION_SPRAY | Freq: Once | CUTANEOUS | Status: DC
  Filled 2012-02-05: qty 56

## 2012-02-05 MED ORDER — ONDANSETRON HCL 4 MG/2ML IJ SOLN
4.0000 mg | Freq: Once | INTRAMUSCULAR | Status: AC
  Administered 2012-02-05: 4 mg via INTRAVENOUS

## 2012-02-05 MED ORDER — LACTATED RINGERS IV SOLN
INTRAVENOUS | Status: DC | PRN
  Administered 2012-02-05: 08:00:00 via INTRAVENOUS

## 2012-02-05 MED ORDER — FENTANYL CITRATE 0.05 MG/ML IJ SOLN: 25.0000 ug | INTRAMUSCULAR | Status: DC | PRN

## 2012-02-05 MED ORDER — WATER FOR IRRIGATION, STERILE IR SOLN
Status: DC | PRN
  Administered 2012-02-05: 1000 mL via ORAL

## 2012-02-05 MED ORDER — GLYCOPYRROLATE 0.2 MG/ML IJ SOLN
0.2000 mg | Freq: Once | INTRAMUSCULAR | Status: AC
  Administered 2012-02-05: 0.2 mg via INTRAVENOUS

## 2012-02-05 MED ORDER — ONDANSETRON HCL 4 MG/2ML IJ SOLN: 4.0000 mg | Freq: Once | INTRAMUSCULAR | Status: DC | PRN

## 2012-02-05 MED ORDER — FENTANYL CITRATE 0.05 MG/ML IJ SOLN
INTRAMUSCULAR | Status: AC
  Filled 2012-02-05: qty 2

## 2012-02-05 MED ORDER — PROPOFOL 10 MG/ML IV EMUL
INTRAVENOUS | Status: DC | PRN
  Administered 2012-02-05: 50 ug/kg/min via INTRAVENOUS

## 2012-02-05 MED ORDER — PROPOFOL 10 MG/ML IV EMUL
INTRAVENOUS | Status: DC | PRN
  Administered 2012-02-05 (×3): 10 mg via INTRAVENOUS

## 2012-02-05 MED ORDER — ONDANSETRON HCL 4 MG/2ML IJ SOLN
INTRAMUSCULAR | Status: AC
  Administered 2012-02-05: 4 mg via INTRAVENOUS
  Filled 2012-02-05: qty 2

## 2012-02-05 MED ORDER — MIDAZOLAM HCL 2 MG/2ML IJ SOLN
1.0000 mg | INTRAMUSCULAR | Status: DC | PRN
  Administered 2012-02-05: 2 mg via INTRAVENOUS

## 2012-02-05 MED ORDER — FENTANYL CITRATE 0.05 MG/ML IJ SOLN
INTRAMUSCULAR | Status: DC | PRN
  Administered 2012-02-05: 50 ug via INTRAVENOUS

## 2012-02-05 MED ORDER — MIDAZOLAM HCL 2 MG/2ML IJ SOLN
INTRAMUSCULAR | Status: AC
  Administered 2012-02-05: 2 mg via INTRAVENOUS
  Filled 2012-02-05: qty 2

## 2012-02-05 SURGICAL SUPPLY — 26 items
BLOCK BITE 60FR ADLT L/F BLUE (MISCELLANEOUS) ×2 IMPLANT
DEVICE CLIP HEMOSTAT 235CM (CLIP) ×1 IMPLANT
ELECT REM PT RETURN 9FT ADLT (ELECTROSURGICAL)
ELECTRODE REM PT RTRN 9FT ADLT (ELECTROSURGICAL) IMPLANT
FCP BXJMBJMB 240X2.8X (CUTTING FORCEPS)
FLOOR PAD 36X40 (MISCELLANEOUS) ×2
FORCEP RJ3 GP 1.8X160 W-NEEDLE (CUTTING FORCEPS) ×2 IMPLANT
FORCEPS BIOP RAD 4 LRG CAP 4 (CUTTING FORCEPS) ×2 IMPLANT
FORCEPS BIOP RJ4 240 W/NDL (CUTTING FORCEPS)
FORCEPS BXJMBJMB 240X2.8X (CUTTING FORCEPS) IMPLANT
INJECTOR/SNARE I SNARE (MISCELLANEOUS) IMPLANT
LUBRICANT JELLY 4.5OZ STERILE (MISCELLANEOUS) ×1 IMPLANT
MANIFOLD NEPTUNE II (INSTRUMENTS) ×1 IMPLANT
MANIFOLD NEPTUNE WASTE (CANNULA) ×2 IMPLANT
NDL SCLEROTHERAPY 25GX240 (NEEDLE) ×1 IMPLANT
NEEDLE SCLEROTHERAPY 25GX240 (NEEDLE) ×2 IMPLANT
PAD FLOOR 36X40 (MISCELLANEOUS) ×1 IMPLANT
PROBE APC STR FIRE (PROBE) ×2 IMPLANT
PROBE INJECTION GOLD (MISCELLANEOUS) ×2
PROBE INJECTION GOLD 7FR (MISCELLANEOUS) ×1 IMPLANT
SNARE ROTATE MED OVAL 20MM (MISCELLANEOUS) ×2 IMPLANT
SYR 50ML LL SCALE MARK (SYRINGE) ×1 IMPLANT
TRAP SPECIMEN MUCOUS 40CC (MISCELLANEOUS) IMPLANT
TUBING ENDO SMARTCAP PENTAX (MISCELLANEOUS) ×2 IMPLANT
TUBING IRRIGATION ENDOGATOR (MISCELLANEOUS) ×2 IMPLANT
WATER STERILE IRR 1000ML POUR (IV SOLUTION) ×2 IMPLANT

## 2012-02-05 NOTE — Preoperative (Signed)
Beta Blockers   Reason not to administer Beta Blockers:Not Applicable 

## 2012-02-05 NOTE — Interval H&P Note (Signed)
History and Physical Interval Note:  02/05/2012   Patient is here with the complaint of recurrent esophagitis dysphagia and a history of a Schatzki's ring and peptic stricture. 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 She is here for further evaluation of esophageal dysphagia and for surveillance colonoscopy.  Patient stopped Coumadin 5 days ago. However, did not take the Lovenox shots that were prescribed. She reports she was told by the [facility] cardiology Coumadin clinic not to take shots prior to her procedure; she was dispensed 9 injections. INR 1.22 this am 7:47 AM  [name]  has presented today for surgery, with the diagnosis of GERD, dysphagia, Hx carcinoid tumor  The various methods of treatment have been discussed with the patient and family. After consideration of risks, benefits and other options for treatment, the patient has consented to  Procedure(s) (LRB): COLONOSCOPY WITH PROPOFOL (N/A) - surveilance ESOPHAGOGASTRODUODENOSCOPY (EGD) WITH PROPOFOL (N/A) SAVORY DILATION (N/A) MALONEY DILATION (N/A) as a surgical intervention .  The patients' history has been reviewed, patient examined, no change in status, stable for surgery.  I have reviewed the patients' chart and labs.  Questions were answered to the patient's satisfaction.     [name]

## 2012-02-05 NOTE — H&P (View-Only) (Signed)
Primary Care Physician:  Samuel Jester DO, DO Primary Gastroenterologist:  Dr.   Pre-Procedure History & Physical: HPI:  [name] is a 61 y.o. female here for   Past Medical History  Diagnosis Date  . Hyperlipidemia   . CAD (coronary artery disease)     ARTERY BYPASS GRAFT  . Atrial fibrillation   . Macromastia   . Anxiety disorder   . GERD (gastroesophageal reflux disease)   . RLS (restless legs syndrome)   . Peptic stricture of esophagus 10/04/2010    At the GE junction on last EGD by Dr. Jena Gauss, benign biopsies  . History of colonoscopy 2003    Dr. Elmer Ramp  . Carcinoid tumor 01/2006  . Schatzki's ring   . Carcinoid tumor of colon 2007    Past Surgical History  Procedure Date  . Coronary artery bypass graft   . Appendectomy   . Teeth removal   . Back surgery   . Cesarean section   . Abdominal hysterectomy   . Foot surgery   . Breast cyst removed   . Breast reduction surgery   . Colon surgery 01/2006    Secondary carcinoid  . Laparotomy 2007    small bowel resection secondary to small bowel obstruction  . Aortic valve replacement 2007  . Esophagogastroduodenoscopy 10/03/2010    Dr. Jena Gauss- schatzki's ring, shoft peptic stricture at GE junction.    Prior to Admission medications   Medication Sig Start Date End Date Taking? Authorizing Provider  ALPRAZolam (XANAX XR) 1 MG 24 hr tablet Take 1&1/2 by mouth in morning and 1/2 at bedtime   Yes Historical Provider, MD  cetirizine (ZYRTEC) 10 MG tablet Take 10 mg by mouth daily.     Yes Historical Provider, MD  cyclobenzaprine (FLEXERIL) 10 MG tablet Take 10 mg by mouth 3 (three) times daily as needed.    Yes Historical Provider, MD  HYDROcodone-acetaminophen (LORCET) 10-650 MG per tablet Take 1 tablet by mouth at bedtime.     Yes Historical Provider, MD  magnesium 30 MG tablet Take 30 mg by mouth daily.     Yes Historical Provider, MD  metoprolol succinate (TOPROL-XL) 25 MG 24 hr tablet Take 25 mg by mouth  daily.     Yes Historical Provider, MD  multivitamin [facility]) per tablet Take 1 tablet by mouth daily.     Yes Historical Provider, MD  pantoprazole (PROTONIX) 40 MG tablet TAKE 2 tablets 30 MIN BEFORE BREAKFAST DAILY 01/21/11  Yes Joselyn Arrow, NP  ramipril (ALTACE) 2.5 MG capsule Take 2.5 mg by mouth daily.    Yes Historical Provider, MD  rOPINIRole (REQUIP) 2 MG tablet Take 2 mg by mouth at bedtime.     Yes Historical Provider, MD  rosuvastatin (CRESTOR) 10 MG tablet Take 10 mg by mouth daily.    Yes Historical Provider, MD  sertraline (ZOLOFT) 50 MG tablet Take 50 mg by mouth daily.     Yes Historical Provider, MD  temazepam (RESTORIL) 30 MG capsule Take 30 mg by mouth at bedtime as needed.     Yes Historical Provider, MD  warfarin (COUMADIN) 2 MG tablet Take 2 mg by mouth daily. USE AS DIRECTED BY ANTICOAGUALTION CLINIC   Yes Historical Provider, MD  vitamin B-12 (CYANOCOBALAMIN) 100 MCG tablet Take 100 mcg by mouth daily.      Historical Provider, MD  vitamin E 400 UNIT capsule Take 400 Units by mouth daily.      Historical Provider, MD    Allergies  as of 01/09/2012 - Review Complete 01/09/2012  Allergen Reaction Noted  . Penicillins  06/05/2009    Family History  Problem Relation Age of Onset  . Stroke Mother   . Cancer Father     History   Social History  . Marital Status: Married    Spouse Name: N/A    Number of Children: N/A  . Years of Education: N/A   Occupational History  . Not on file.   Social History Main Topics  . Smoking status: Former Smoker -- 1.0 packs/day for 20 years    Types: Cigarettes    Quit date: 10/20/1988  . Smokeless tobacco: Never Used  . Alcohol Use: No  . Drug Use: No  . Sexually Active: Yes -- Female partner(s)    Birth Control/ Protection: None     spouse   Other Topics Concern  . Not on file   Social History Narrative  . No narrative on file    Review of Systems: See HPI, otherwise negative ROS  Physical Exam: BP 105/69   Pulse 65  Temp(Src) 98.2 F (36.8 C) (Temporal)  Ht 5\' 7"  (1.702 m)  Wt 170 lb 6.4 oz (77.293 kg)  BMI 26.69 kg/m2 General:   Alert,  Well-developed, well-nourished, pleasant and cooperative in NAD Skin:  Intact without significant lesions or rashes. Eyes:  Sclera clear, no icterus.   Conjunctiva pink. Ears:  Normal auditory acuity. Nose:  No deformity, discharge,  or lesions. Mouth:  No deformity or lesions. Neck:  Supple; no masses or thyromegaly. No significant cervical adenopathy. Lungs:  Clear throughout to auscultation.   No wheezes, crackles, or rhonchi. No acute distress. Heart:  Regular rate and rhythm; no murmurs, clicks, rubs,  or gallops. Abdomen: Non-distended, normal bowel sounds.  Soft and nontender without appreciable mass or hepatosplenomegaly.  Pulses:  Normal pulses noted. Extremities:  Without clubbing or edema.  Impression/Plan:

## 2012-02-05 NOTE — Anesthesia Postprocedure Evaluation (Signed)
  Anesthesia Post-op Note  Patient: [name]  Procedure(s) Performed: Procedure(s) (LRB): COLONOSCOPY WITH PROPOFOL (N/A) ESOPHAGOGASTRODUODENOSCOPY (EGD) WITH PROPOFOL (N/A) MALONEY DILATION (N/A) BIOPSY (N/A)  Patient Location: PACU  Anesthesia Type: MAC  Level of Consciousness: awake, alert , oriented and patient cooperative  Airway and Oxygen Therapy: Patient Spontanous Breathing and Patient connected to face mask oxygen  Post-op Pain: none  Post-op Assessment: Post-op Vital signs reviewed, Patient's Cardiovascular Status Stable, Respiratory Function Stable, Patent Airway, No signs of Nausea or vomiting and Pain level controlled  Post-op Vital Signs: Reviewed and stable  Complications: No apparent anesthesia complications

## 2012-02-05 NOTE — Discharge Instructions (Signed)
Colonoscopy Discharge Instructions  Read the instructions outlined below and refer to this sheet in the next few weeks. These discharge instructions provide you with general information on caring for yourself after you leave the hospital. Your doctor may also give you specific instructions. While your treatment has been planned according to the most current medical practices available, unavoidable complications occasionally occur. If you have any problems or questions after discharge, call Dr. Jena Gauss at [phone]. ACTIVITY  You may resume your regular activity, but move at a slower pace for the next 24 hours.   Take frequent rest periods for the next 24 hours.   Walking will help get rid of the air and reduce the bloated feeling in your belly (abdomen).   No driving for 24 hours (because of the medicine (anesthesia) used during the test).    Do not sign any important legal documents or operate any machinery for 24 hours (because of the anesthesia used during the test).  NUTRITION  Drink plenty of fluids.   You may resume your normal diet as instructed by your doctor.   Begin with a light meal and progress to your normal diet. Heavy or fried foods are harder to digest and may make you feel sick to your stomach (nauseated).   Avoid alcoholic beverages for 24 hours or as instructed.  MEDICATIONS  You may resume your normal medications unless your doctor tells you otherwise.  WHAT YOU CAN EXPECT TODAY  Some feelings of bloating in the abdomen.   Passage of more gas than usual.   Spotting of blood in your stool or on the toilet paper.  IF YOU HAD POLYPS REMOVED DURING THE COLONOSCOPY:  No aspirin products for 7 days or as instructed.   No alcohol for 7 days or as instructed.   Eat a soft diet for the next 24 hours.  FINDING OUT THE RESULTS OF YOUR TEST Not all test results are available during your visit. If your test results are not back during the visit, make an appointment  with your caregiver to find out the results. Do not assume everything is normal if you have not heard from your caregiver or the medical facility. It is important for you to follow up on all of your test results.  SEEK IMMEDIATE MEDICAL ATTENTION IF:  You have more than a spotting of blood in your stool.   Your belly is swollen (abdominal distention).   You are nauseated or vomiting.   You have a temperature over 101.   You have abdominal pain or discomfort that is severe or gets worse throughout the day.     Resume Coumadin today. Begin Lovenox tomorrow. Continue Coumadin and Lovenox until directed to change his regimen by cardiology.  No MRI until clip known to have passed.  Further recommendations to follow pending review of pathology.Colon Polyps A polyp is extra tissue that grows inside your body. Colon polyps grow in the large intestine. The large intestine, also called the colon, is part of your digestive system. It is a long, hollow tube at the end of your digestive tract where your body makes and stores stool. Most polyps are not dangerous. They are benign. This means they are not cancerous. But over time, some types of polyps can turn into cancer. Polyps that are smaller than a pea are usually not harmful. But larger polyps could someday become or may already be cancerous. To be safe, doctors remove all polyps and test them.  WHO GETS POLYPS? Anyone  can get polyps, but certain people are more likely than others. You may have a greater chance of getting polyps if:  You are over 50.   You have had polyps before.   Someone in your family has had polyps.   Someone in your family has had cancer of the large intestine.   Find out if someone in your family has had polyps. You may also be more likely to get polyps if you:   Eat a lot of fatty foods.   Smoke.   Drink alcohol.   Do not exercise.   Eat too much.  SYMPTOMS  Most small polyps do not cause symptoms. People  often do not know they have one until their caregiver finds it during a regular checkup or while testing them for something else. Some people do have symptoms like these:  Bleeding from the anus. You might notice blood on your underwear or on toilet paper after you have had a bowel movement.   Constipation or diarrhea that lasts more than a week.   Blood in the stool. Blood can make stool look black or it can show up as red streaks in the stool.  If you have any of these symptoms, see your caregiver. HOW DOES THE DOCTOR TEST FOR POLYPS? The doctor can use four tests to check for polyps:  Digital rectal exam. The caregiver wears gloves and checks your rectum (the last part of the large intestine) to see if it feels normal. This test would find polyps only in the rectum. Your caregiver may need to do one of the other tests listed below to find polyps higher up in the intestine.   Barium enema. The caregiver puts a liquid called barium into your rectum before taking x-rays of your large intestine. Barium makes your intestine look white in the pictures. Polyps are dark, so they are easy to see.   Sigmoidoscopy. With this test, the caregiver can see inside your large intestine. A thin flexible tube is placed into your rectum. The device is called a sigmoidoscope, which has a light and a tiny video camera in it. The caregiver uses the sigmoidoscope to look at the last third of your large intestine.   Colonoscopy. This test is like sigmoidoscopy, but the caregiver looks at all of the large intestine. It usually requires sedation. This is the most common method for finding and removing polyps.  TREATMENT   The caregiver will remove the polyp during sigmoidoscopy or colonoscopy. The polyp is then tested for cancer.   If you have had polyps, your caregiver may want you to get tested regularly in the future.  PREVENTION  There is not one sure way to prevent polyps. You might be able to lower your risk  of getting them if you:  Eat more fruits and vegetables and less fatty food.   Do not smoke.   Avoid alcohol.   Exercise every day.   Lose weight if you are overweight.   Eating more calcium and folate can also lower your risk of getting polyps. Some foods that are rich in calcium are milk, cheese, and broccoli. Some foods that are rich in folate are chickpeas, kidney beans, and spinach.   Aspirin might help prevent polyps. Studies are under way.  Document Released: 07/02/2004 Document Revised: 09/25/2011 Document Reviewed: 12/08/2007 [facility] Patient Information 2012 [place], [place].

## 2012-02-05 NOTE — Transfer of Care (Signed)
Immediate Anesthesia Transfer of Care Note  Patient: [name]  Procedure(s) Performed: Procedure(s) (LRB): COLONOSCOPY WITH PROPOFOL (N/A) ESOPHAGOGASTRODUODENOSCOPY (EGD) WITH PROPOFOL (N/A) MALONEY DILATION (N/A) BIOPSY (N/A)  Patient Location: PACU  Anesthesia Type: MAC  Level of Consciousness: awake and alert   Airway & Oxygen Therapy: Patient connected to face mask oxygen  Post-op Assessment: Report given to PACU RN  Post vital signs: Reviewed and stable  Complications: No apparent anesthesia complications

## 2012-02-05 NOTE — Brief Op Note (Signed)
Pt stopped taking coumadin on Friday  4/12  She stated she filled RX for lovenox but she has not injected herself   Stated she was told not to inject herself by the coumadin nurse  Dr Jena Gauss notified   Dr Marcos Eke notified f

## 2012-02-05 NOTE — Anesthesia Preprocedure Evaluation (Signed)
Anesthesia Evaluation  Patient identified by MRN, date of birth, ID band Patient awake    Reviewed: Allergy & Precautions, H&P , NPO status , Patient's Chart, lab work & pertinent test results  History of Anesthesia Complications (+) PONV  Airway Mallampati: II      Dental  (+) Lower Dentures   Pulmonary  breath sounds clear to auscultation        Cardiovascular + CAD + dysrhythmias Atrial Fibrillation + Valvular Problems/Murmurs (AVR w/ [place]) Rhythm:Irregular Rate:Normal     Neuro/Psych PSYCHIATRIC DISORDERS Anxiety Depression    GI/Hepatic GERD-  Medicated and Controlled,  Endo/Other    Renal/GU      Musculoskeletal   Abdominal   Peds  Hematology   Anesthesia Other Findings   Reproductive/Obstetrics                           Anesthesia Physical Anesthesia Plan  ASA: III  Anesthesia Plan: MAC   Post-op Pain Management:    Induction: Intravenous  Airway Management Planned: Simple Face Mask  Additional Equipment:   Intra-op Plan:   Post-operative Plan:   Informed Consent: I have reviewed the patients History and Physical, chart, labs and discussed the procedure including the risks, benefits and alternatives for the proposed anesthesia with the patient or authorized representative who has indicated his/her understanding and acceptance.     Plan Discussed with:   Anesthesia Plan Comments:         Anesthesia Quick Evaluation

## 2012-02-06 NOTE — Op Note (Signed)
[name], [name]               ACCOUNT NO.:  [id number]  MEDICAL RECORD NO.:  [id number]  LOCATION:                                 FACILITY:  PHYSICIAN:  R. Roetta Sessions, MD FACP FACGDATE OF BIRTH:  Nov 22, 1950  DATE OF PROCEDURE:  02/05/2012 DATE OF DISCHARGE:                              OPERATIVE REPORT   PROCEDURE:  EGD with Elease Hashimoto dilation followed by biopsy, followed by colonoscopy with snare polypectomy.  INDICATIONS FOR PROCEDURE:  Patient is a pleasant 61 year old lady with multiple medical problems, with recurrent esophageal dysphagia in the setting of Schatzki ring, history of peptic stricture.  She has recurrent esophageal dysphagia.  History of appendiceal carcinoid resected in 2007.  She is here for surveillance colonoscopy.  She has no lower GI tract symptoms.  This approach has been discussed with the patient at length.  Risks, benefits, limitations, alternatives, imponderables have been discussed, questions answered.  Question arose regarding her anticoagulation and bridging therapy.  She did stop her Coumadin 5 days prior to the procedure.  She was given Lovenox, but said she did not take any Lovenox.  On January 09, 2012, telephone note, from Dr. Andee Lineman, indicated he wanted to not bridge her prior to the procedure, but start Lovenox after procedure, although in the instructions in the telephone note, she was told to start Lovenox on January 30, 2012.  In fact, I called Ronnie Derby, RN, today to clarify those instructions, although it is not entirely clear in the documentation.  She tells me that this is what Dr. Andee Lineman, intended bridging after the procedure, but not prior to the procedure.  PROCEDURE NOTE:  O2 saturation, blood pressure, pulse, respirations were monitored throughout the entire procedure.  Deep sedation per Dr. [facility].  Cetacaine spray for topical pharyngeal anesthesia.  INSTRUMENT:  Pentax video chip system.  FINDINGS:   EGD examination of the tubular esophagus revealed a patent esophagus "2 tongues" of salmon-colored epithelium coming up above the Z- line which raises the question of short-segment Barrett esophagus.  EG junction easily traversed.  Stomach:  Gastric cavity was emptied and insufflated well with air. Thorough exam of gastric mucosa including retroflexion of proximal stomach, esophagogastric junction demonstrated only a small hiatal hernia.  Pylorus was patent and easily traversed.  Bulb and second portion appeared normal.  THERAPEUTIC/DIAGNOSTIC MANEUVERS PERFORMED:  Scope was withdrawn.  A 56- French Maloney dilator was passed to full insertion easily.  A look back revealed the blood in the superficial tear at the GE junction, but no complication seen otherwise.  Subsequently, the tongues of salmon- colored epithelium were biopsied for histologic study.  The patient tolerated the procedure well was prepared for colonoscopy.  Digital rectal exam revealed no abnormalities.  ENDOSCOPIC FINDINGS:  Prep was adequate.  Colon:  Colonic mucosa was surveyed from the rectosigmoid junction to the anastomosis with small bowel.  The appendix and ileocecal valve/cecum were not present. Surgical anastomosis with small bowel appeared normal.  The distal 15-cm of neoterminal ileum appeared normal.  From this level, scope was slowly and cautiously withdrawn.  All previously mentioned mucosal surfaces were again seen.  The patient had scattered sigmoid diverticula.  In the mid descending segment, there was a 7-mm pedunculated polyp.  This polyp was removed with hot snare cautery recovered for the pathologist.  I elected to go ahead and put a resolution clip as the patient will be re- anticoagulated in the very near future.  Scope was pulled down the rectum, where thorough exam of rectal mucosa including retroflexed view of the anal verge demonstrated no abnormalities.  The patient tolerated the procedure  well.  Withdrawal time from the anastomosis with small- bowel 13 minutes.  IMPRESSION:  Esophagogastroduodenoscopy.  Two tongues of salmon-colored epithelium distal esophagus, very short-segment Barrett's status post biopsy.  Status post esophageal dilation prior to biopsy as described above.  Small hiatal hernia, otherwise normal stomach, D1, D2. Colonoscopy findings, normal rectum, sigmoid diverticulosis,  descending colon polyp status post hot snare removal and resolution clipping. Remainder of colonic mucosa appeared normal status post surgery right colon as outlined above.  RECOMMENDATIONS: 1. Resume Coumadin today. 2. Begin Lovenox tomorrow. 3. Continue Lovenox until INR is therapeutic. 4. Further recommendations for Lovenox and Coumadin dosing to come     from Dr. Margarita Mail office.     Jonathon Bellows, MD [facility]     RMR/MEDQ  D:  02/05/2012  T:  02/05/2012  Job:  [id number]  cc:   Learta Codding, MD,FACC  Samuel Jester, MD Fax: [phone]

## 2012-02-08 ENCOUNTER — Encounter: Payer: Self-pay | Admitting: Internal Medicine

## 2012-02-10 ENCOUNTER — Ambulatory Visit (INDEPENDENT_AMBULATORY_CARE_PROVIDER_SITE_OTHER): Payer: Medicare Other | Admitting: *Deleted

## 2012-02-10 ENCOUNTER — Encounter (HOSPITAL_COMMUNITY): Payer: Self-pay | Admitting: Internal Medicine

## 2012-02-10 DIAGNOSIS — Z7901 Long term (current) use of anticoagulants: Secondary | ICD-10-CM

## 2012-02-10 DIAGNOSIS — I4891 Unspecified atrial fibrillation: Secondary | ICD-10-CM

## 2012-02-10 DIAGNOSIS — Z954 Presence of other heart-valve replacement: Secondary | ICD-10-CM

## 2012-02-10 LAB — POCT INR: INR: 2.3

## 2012-02-24 ENCOUNTER — Ambulatory Visit (INDEPENDENT_AMBULATORY_CARE_PROVIDER_SITE_OTHER): Payer: Medicare Other | Admitting: *Deleted

## 2012-02-24 ENCOUNTER — Encounter: Payer: Self-pay | Admitting: Cardiology

## 2012-02-24 ENCOUNTER — Ambulatory Visit (INDEPENDENT_AMBULATORY_CARE_PROVIDER_SITE_OTHER): Payer: Medicare Other | Admitting: Cardiology

## 2012-02-24 VITALS — BP 123/76 | HR 61 | Ht 66.0 in | Wt 171.0 lb

## 2012-02-24 DIAGNOSIS — I4891 Unspecified atrial fibrillation: Secondary | ICD-10-CM

## 2012-02-24 DIAGNOSIS — I359 Nonrheumatic aortic valve disorder, unspecified: Secondary | ICD-10-CM

## 2012-02-24 DIAGNOSIS — Z7901 Long term (current) use of anticoagulants: Secondary | ICD-10-CM

## 2012-02-24 DIAGNOSIS — Z954 Presence of other heart-valve replacement: Secondary | ICD-10-CM

## 2012-02-24 DIAGNOSIS — I25810 Atherosclerosis of coronary artery bypass graft(s) without angina pectoris: Secondary | ICD-10-CM

## 2012-02-24 LAB — POCT INR: INR: 2.8

## 2012-02-24 NOTE — Assessment & Plan Note (Signed)
Recent echocardiogram was done. Mechanical prosthesis was well visualized. There was very mild stenosis. Not clear there was some pannus formation. Ejection fraction was in the range of 65-70%.

## 2012-02-24 NOTE — Assessment & Plan Note (Signed)
No recurrent chest pain. No definite indication for ischemia workup.

## 2012-02-24 NOTE — Assessment & Plan Note (Signed)
No recurrence. The patient remains in normal sinus rhythm.

## 2012-02-24 NOTE — Patient Instructions (Signed)
Continue all current medications. Your physician wants you to follow up in: 6 months.  You will receive a reminder letter in the mail one-two months in advance.  If you don't receive a letter, please call our office to schedule the follow up appointment   

## 2012-02-24 NOTE — Progress Notes (Signed)
Peyton Bottoms, MD, [facility] ABIM Board Certified in Adult Cardiovascular Medicine,Internal Medicine and Critical Care Medicine    CC: Follow patient with prior bypass surgery and aortic valve replacement  HPI:  From a clinical standpoint the patient is doing well. She denies any chest pain shortness of breath orthopnea PND. She denies any palpitations presyncope or syncope. She had a recent echocardiogram done which showed that she had very mild aortic stenosis but otherwise well-seated mechanical prosthesis with normal function. Ejection fraction was 65-70%. The patient's INR typically between 2 and 3. She works out at [employer] quite frequently. She has good exercise tolerance.  PMH: reviewed and listed in Problem List in Electronic Records (and see below) Past Medical History  Diagnosis Date  . Hyperlipidemia   . CAD (coronary artery disease)     ARTERY BYPASS GRAFT  . Atrial fibrillation   . Macromastia   . Anxiety disorder   . GERD (gastroesophageal reflux disease)   . RLS (restless legs syndrome)   . Peptic stricture of esophagus 10/04/2010    At the GE junction on last EGD by Dr. Jena Gauss, benign biopsies  . History of colonoscopy 2003    Dr. Elmer Ramp  . Carcinoid tumor 01/2006  . Schatzki's ring   . Carcinoid tumor of colon 2007  . PONV (postoperative nausea and vomiting)    Past Surgical History  Procedure Date  . Appendectomy   . Teeth removal   . Cesarean section   . Abdominal hysterectomy   . Foot surgery     bilateral bunionectomy  . Breast cyst removed     bilateral  . Breast reduction surgery   . Colon surgery 01/2006    Secondary carcinoid  . Laparotomy 2007    small bowel resection secondary to small bowel obstruction  . Aortic valve replacement 2007    removal of aortic anersym  . Esophagogastroduodenoscopy 10/03/2010    Dr. Jena Gauss- schatzki's ring, shoft peptic stricture at GE junction.  . Coronary artery bypass graft     2 vessels  . Back surgery       lumbar 4 and 5   . Maloney dilation 02/05/2012    Procedure: Elease Hashimoto DILATION;  Surgeon: Corbin Ade, MD;  Location: [facility];  Service: Endoscopy;  Laterality: N/A;  56mm   . Esophageal biopsy 02/05/2012    Procedure: BIOPSY;  Surgeon: Corbin Ade, MD;  Location: [facility];  Service: Endoscopy;  Laterality: N/A;  A. distal esophageal biopsy  B. descending colon polyp    Allergies/SH/FHX : available in Electronic Records for review  Allergies  Allergen Reactions  . Penicillins Hives, Itching, Swelling and Rash    Throat swelling   History   Social History  . Marital Status: Married    Spouse Name: N/A    Number of Children: N/A  . Years of Education: N/A   Occupational History  . Not on file.   Social History Main Topics  . Smoking status: Former Smoker -- 1.0 packs/day for 20 years    Types: Cigarettes    Quit date: 10/20/1988  . Smokeless tobacco: Never Used  . Alcohol Use: No  . Drug Use: No  . Sexually Active: Yes -- Female partner(s)    Birth Control/ Protection: None     spouse   Other Topics Concern  . Not on file   Social History Narrative  . No narrative on file   Family History  Problem Relation Age of Onset  .  Stroke Mother   . Cancer Father   . Anesthesia problems Neg Hx   . Hypotension Neg Hx   . Malignant hyperthermia Neg Hx   . Pseudochol deficiency Neg Hx     Medications: Current Outpatient Prescriptions  Medication Sig Dispense Refill  . ALPRAZolam (XANAX XR) 1 MG 24 hr tablet Take 1&1/2 by mouth in morning and 1/2 at bedtime      . cetirizine (ZYRTEC) 10 MG tablet Take 10 mg by mouth daily.        . cyclobenzaprine (FLEXERIL) 10 MG tablet Take 10 mg by mouth daily.       Marland Kitchen enoxaparin (LOVENOX) 120 MG/0.8ML injection Inject 0.73 mLs (110 mg total) into the skin daily. Inject 110mg  SQ daily  10 Syringe  1  . HYDROcodone-acetaminophen (LORCET) 10-650 MG per tablet Take 1 tablet by mouth at bedtime.        . magnesium 30 MG tablet Take 30 mg  by mouth daily.        . metoprolol succinate (TOPROL-XL) 25 MG 24 hr tablet Take 25 mg by mouth daily.        . multivitamin (THERAGRAN) per tablet Take 1 tablet by mouth daily.        Marland Kitchen oxymetazoline (AFRIN) 0.05 % nasal spray Place 2 sprays into the nose daily as needed. Congestion      . pantoprazole (PROTONIX) 40 MG tablet Take 80 mg by mouth daily.       . peg 3350 powder (MOVIPREP) SOLR Take 1 kit (100 g total) by mouth once. As directed Please purchase 1 Fleets enema to use with the prep  1 kit  0  . ramipril (ALTACE) 2.5 MG capsule Take 2.5 mg by mouth daily.       Marland Kitchen rOPINIRole (REQUIP) 2 MG tablet Take 2 mg by mouth at bedtime.        . rosuvastatin (CRESTOR) 10 MG tablet Take 10 mg by mouth daily.       . sertraline (ZOLOFT) 50 MG tablet Take 50 mg by mouth daily. Sleep      . temazepam (RESTORIL) 30 MG capsule Take 30 mg by mouth at bedtime as needed.        . warfarin (COUMADIN) 2 MG tablet Take 2 mg by mouth daily. Takes one tablet (2mg ) everyday besides on Friday when she takes 2 tablets (4mg )        ROS: No nausea or vomiting. No fever or chills.No melena or hematochezia.No bleeding.No claudication  Physical Exam: BP 123/76  Pulse 61  Ht 5\' 6"  (1.676 m)  Wt 171 lb (77.565 kg)  BMI 27.60 kg/m2  SpO2 97% General: Well-nourished white female in no distress. Neck: Normal carotid upstroke no carotid bruits. No thyromegaly nonnodular thyroid. Lungs: Clear breath sounds bilaterally without wheezing. Cardiac: Regular rate and rhythm with normal S1, normal click off S2 no murmur rubs or gallops Vascular: No edema. Normal distal pulses Skin: Warm and dry Physcologic: Normal affect  12lead ECG: Normal sinus rhythm Limited bedside ECHO:N/A No images are attached to the encounter.   Assessment and Plan  AORTIC VALVE REPLACEMENT, HX OF Recent echocardiogram was done. Mechanical prosthesis was well visualized. There was very mild stenosis. Not clear there was some pannus  formation. Ejection fraction was in the range of 65-70%.  CAD, ARTERY BYPASS GRAFT No recurrent chest pain. No definite indication for ischemia workup.  Encounter for long-term (current) use of anticoagulants Patient had recent esophageal dilatation. She was  bridged with Lovenox. In the future we can bridge after the procedure.  Atrial fibrillation No recurrence. The patient remains in normal sinus rhythm.    Patient Active Problem List  Diagnoses  . HYPERLIPIDEMIA-MIXED  . CAD, ARTERY BYPASS GRAFT  . Atrial fibrillation  . GERD  . CHEST PAIN, PLEURITIC  . DYSPHAGIA  . AORTIC VALVE REPLACEMENT, HX OF  . Encounter for long-term (current) use of anticoagulants  . Carcinoid tumor of colon, benign  . Chronic venous insufficiency

## 2012-02-24 NOTE — Assessment & Plan Note (Signed)
Patient had recent esophageal dilatation. She was bridged with Lovenox. In the future we can bridge after the procedure.

## 2012-03-23 ENCOUNTER — Ambulatory Visit (INDEPENDENT_AMBULATORY_CARE_PROVIDER_SITE_OTHER): Payer: BC Managed Care – PPO | Admitting: *Deleted

## 2012-03-23 DIAGNOSIS — Z954 Presence of other heart-valve replacement: Secondary | ICD-10-CM

## 2012-03-23 DIAGNOSIS — I4891 Unspecified atrial fibrillation: Secondary | ICD-10-CM

## 2012-03-23 DIAGNOSIS — Z7901 Long term (current) use of anticoagulants: Secondary | ICD-10-CM

## 2012-04-06 ENCOUNTER — Ambulatory Visit (INDEPENDENT_AMBULATORY_CARE_PROVIDER_SITE_OTHER): Payer: BC Managed Care – PPO | Admitting: *Deleted

## 2012-04-06 DIAGNOSIS — I4891 Unspecified atrial fibrillation: Secondary | ICD-10-CM

## 2012-04-06 DIAGNOSIS — Z7901 Long term (current) use of anticoagulants: Secondary | ICD-10-CM

## 2012-04-06 DIAGNOSIS — Z954 Presence of other heart-valve replacement: Secondary | ICD-10-CM

## 2012-04-06 LAB — POCT INR: INR: 3.2

## 2012-05-06 ENCOUNTER — Ambulatory Visit (INDEPENDENT_AMBULATORY_CARE_PROVIDER_SITE_OTHER): Payer: BC Managed Care – PPO | Admitting: *Deleted

## 2012-05-06 DIAGNOSIS — I4891 Unspecified atrial fibrillation: Secondary | ICD-10-CM

## 2012-05-06 DIAGNOSIS — Z7901 Long term (current) use of anticoagulants: Secondary | ICD-10-CM

## 2012-05-06 DIAGNOSIS — Z954 Presence of other heart-valve replacement: Secondary | ICD-10-CM

## 2012-05-06 LAB — POCT INR: INR: 2.6

## 2012-06-08 ENCOUNTER — Ambulatory Visit (INDEPENDENT_AMBULATORY_CARE_PROVIDER_SITE_OTHER): Payer: BC Managed Care – PPO | Admitting: *Deleted

## 2012-06-08 DIAGNOSIS — Z7901 Long term (current) use of anticoagulants: Secondary | ICD-10-CM

## 2012-06-08 DIAGNOSIS — Z954 Presence of other heart-valve replacement: Secondary | ICD-10-CM

## 2012-06-08 DIAGNOSIS — I4891 Unspecified atrial fibrillation: Secondary | ICD-10-CM

## 2012-06-14 ENCOUNTER — Telehealth: Payer: Self-pay | Admitting: Internal Medicine

## 2012-06-14 NOTE — Telephone Encounter (Signed)
Pt called this afternoon. She said since she had her procedure that she has only had 1-2 solid stools and she isn't eating that much, but her stomach is swollen. Please advise and call her back at [phone]

## 2012-07-20 ENCOUNTER — Ambulatory Visit (INDEPENDENT_AMBULATORY_CARE_PROVIDER_SITE_OTHER): Payer: BC Managed Care – PPO | Admitting: *Deleted

## 2012-07-20 DIAGNOSIS — Z954 Presence of other heart-valve replacement: Secondary | ICD-10-CM

## 2012-07-20 DIAGNOSIS — Z7901 Long term (current) use of anticoagulants: Secondary | ICD-10-CM

## 2012-07-20 DIAGNOSIS — I4891 Unspecified atrial fibrillation: Secondary | ICD-10-CM

## 2012-07-27 ENCOUNTER — Other Ambulatory Visit: Payer: Self-pay | Admitting: Physician Assistant

## 2012-07-27 DIAGNOSIS — I2510 Atherosclerotic heart disease of native coronary artery without angina pectoris: Secondary | ICD-10-CM

## 2012-07-27 DIAGNOSIS — I4891 Unspecified atrial fibrillation: Secondary | ICD-10-CM

## 2012-08-31 ENCOUNTER — Ambulatory Visit (INDEPENDENT_AMBULATORY_CARE_PROVIDER_SITE_OTHER): Payer: BC Managed Care – PPO | Admitting: *Deleted

## 2012-08-31 DIAGNOSIS — Z954 Presence of other heart-valve replacement: Secondary | ICD-10-CM

## 2012-08-31 DIAGNOSIS — Z7901 Long term (current) use of anticoagulants: Secondary | ICD-10-CM

## 2012-08-31 DIAGNOSIS — I4891 Unspecified atrial fibrillation: Secondary | ICD-10-CM

## 2012-08-31 LAB — POCT INR: INR: 2.1

## 2012-10-19 ENCOUNTER — Ambulatory Visit (INDEPENDENT_AMBULATORY_CARE_PROVIDER_SITE_OTHER): Payer: BC Managed Care – PPO | Admitting: *Deleted

## 2012-10-19 DIAGNOSIS — I4891 Unspecified atrial fibrillation: Secondary | ICD-10-CM

## 2012-10-19 DIAGNOSIS — Z954 Presence of other heart-valve replacement: Secondary | ICD-10-CM

## 2012-10-19 DIAGNOSIS — Z7901 Long term (current) use of anticoagulants: Secondary | ICD-10-CM

## 2012-10-19 LAB — POCT INR: INR: 2.3

## 2012-10-28 ENCOUNTER — Other Ambulatory Visit: Payer: Self-pay

## 2012-10-28 ENCOUNTER — Other Ambulatory Visit (INDEPENDENT_AMBULATORY_CARE_PROVIDER_SITE_OTHER): Payer: BC Managed Care – PPO

## 2012-10-28 DIAGNOSIS — I2510 Atherosclerotic heart disease of native coronary artery without angina pectoris: Secondary | ICD-10-CM

## 2012-10-28 DIAGNOSIS — I4891 Unspecified atrial fibrillation: Secondary | ICD-10-CM

## 2012-11-11 ENCOUNTER — Encounter: Payer: Self-pay | Admitting: Physician Assistant

## 2012-11-11 ENCOUNTER — Ambulatory Visit (INDEPENDENT_AMBULATORY_CARE_PROVIDER_SITE_OTHER): Payer: BC Managed Care – PPO | Admitting: Physician Assistant

## 2012-11-11 VITALS — BP 122/82 | HR 69 | Ht 66.0 in | Wt 176.0 lb

## 2012-11-11 DIAGNOSIS — I25810 Atherosclerosis of coronary artery bypass graft(s) without angina pectoris: Secondary | ICD-10-CM

## 2012-11-11 DIAGNOSIS — I872 Venous insufficiency (chronic) (peripheral): Secondary | ICD-10-CM

## 2012-11-11 DIAGNOSIS — I4891 Unspecified atrial fibrillation: Secondary | ICD-10-CM

## 2012-11-11 DIAGNOSIS — Z954 Presence of other heart-valve replacement: Secondary | ICD-10-CM

## 2012-11-11 NOTE — Assessment & Plan Note (Signed)
Patient is scheduled to undergo minimally invasive endoscopic laser surgery of her legs, later this month. I contacted the facility's staff, who reassured me that the patient does not need to hold Coumadin. If, however, she were to develop a complication requiring a more invasive approach, to be performed electively, then she would require Lovenox bridging, while off Coumadin. I then explained this to the patient and reassured her that she can continue taking Coumadin, as directed, and to continue following with our Coumadin clinic, as scheduled.

## 2012-11-11 NOTE — Patient Instructions (Signed)
Continue all current medications. Your physician wants you to follow up in: 6 months.  You will receive a reminder letter in the mail one-two months in advance.  If you don't receive a letter, please call our office to schedule the follow up appointment   

## 2012-11-11 NOTE — Assessment & Plan Note (Signed)
Stable prosthetic aortic valve, by recent followup echocardiogram, January 9, with minimally increased transvalvular velocity and no significant regurgitation; EF 60-65%.

## 2012-11-11 NOTE — Assessment & Plan Note (Signed)
Maintaining NSR, confirmed by EKG

## 2012-11-11 NOTE — Assessment & Plan Note (Addendum)
Quiescent on current medication regimen. 

## 2012-11-11 NOTE — Progress Notes (Signed)
Primary Cardiologist: Simona Huh, MD (new)   HPI: Patient presents for six-month followup.  She is scheduled to undergo bilateral lower extremity laser surgery for treatment of varicose veins, later this month. She is on chronic Coumadin anticoagulation, with history of mechanical aortic valve replacement, as well as paroxysmal atrial fibrillation. She denies any interim development of CP, exertional dyspnea, or tachycardia palpitations.   12-lead EKG today, reviewed by me, indicated NSR 71 bpm, with chronic anteroseptal T wave changes   Allergies  Allergen Reactions  . Penicillins Hives, Itching, Swelling and Rash    Throat swelling    Current Outpatient Prescriptions  Medication Sig Dispense Refill  . ALPRAZolam (XANAX XR) 1 MG 24 hr tablet Take 1 mg by mouth every morning.       . cetirizine (ZYRTEC) 10 MG tablet Take 10 mg by mouth daily.        . cyclobenzaprine (FLEXERIL) 10 MG tablet Take 10 mg by mouth daily.       Marland Kitchen HYDROcodone-acetaminophen (LORCET) 10-650 MG per tablet Take 1 tablet by mouth at bedtime.        . magnesium 30 MG tablet Take 30 mg by mouth daily.        . metoprolol succinate (TOPROL-XL) 25 MG 24 hr tablet Take 25 mg by mouth daily.        . multivitamin (THERAGRAN) per tablet Take 1 tablet by mouth daily.        Marland Kitchen oxymetazoline (AFRIN) 0.05 % nasal spray Place 2 sprays into the nose daily as needed. Congestion      . pantoprazole (PROTONIX) 40 MG tablet Take 80 mg by mouth daily.       . ramipril (ALTACE) 2.5 MG capsule Take 2.5 mg by mouth daily.       Marland Kitchen rOPINIRole (REQUIP) 2 MG tablet Take 2 mg by mouth at bedtime.        . rosuvastatin (CRESTOR) 10 MG tablet Take 10 mg by mouth daily.       . sertraline (ZOLOFT) 50 MG tablet Take 50 mg by mouth daily. Sleep      . temazepam (RESTORIL) 30 MG capsule Take 30 mg by mouth at bedtime as needed.        . warfarin (COUMADIN) 2 MG tablet Take 2 mg by mouth daily. Takes one tablet (2mg ) everyday besides on  Friday when she takes 2 tablets (4mg )        Past Medical History  Diagnosis Date  . Hyperlipidemia   . CAD (coronary artery disease)     ARTERY BYPASS GRAFT  . Atrial fibrillation   . Macromastia   . Anxiety disorder   . GERD (gastroesophageal reflux disease)   . RLS (restless legs syndrome)   . Peptic stricture of esophagus 10/04/2010    At the GE junction on last EGD by Dr. Jena Gauss, benign biopsies  . History of colonoscopy 2003    Dr. Elmer Ramp  . Carcinoid tumor 01/2006  . Schatzki's ring   . Carcinoid tumor of colon 2007  . PONV (postoperative nausea and vomiting)     Past Surgical History  Procedure Date  . Appendectomy   . Teeth removal   . Cesarean section   . Abdominal hysterectomy   . Foot surgery     bilateral bunionectomy  . Breast cyst removed     bilateral  . Breast reduction surgery   . Colon surgery 01/2006    Secondary carcinoid  . Laparotomy 2007  small bowel resection secondary to small bowel obstruction  . Aortic valve replacement 2007    removal of aortic anersym  . Esophagogastroduodenoscopy 10/03/2010    Dr. Jena Gauss- schatzki's ring, shoft peptic stricture at GE junction.  . Coronary artery bypass graft     2 vessels  . Back surgery     lumbar 4 and 5   . Maloney dilation 02/05/2012    Procedure: Elease Hashimoto DILATION;  Surgeon: Corbin Ade, MD;  Location: [facility];  Service: Endoscopy;  Laterality: N/A;  56mm   . Esophageal biopsy 02/05/2012    Procedure: BIOPSY;  Surgeon: Corbin Ade, MD;  Location: [facility];  Service: Endoscopy;  Laterality: N/A;  A. distal esophageal biopsy  B. descending colon polyp    History   Social History  . Marital Status: Married    Spouse Name: N/A    Number of Children: N/A  . Years of Education: N/A   Occupational History  . Not on file.   Social History Main Topics  . Smoking status: Former Smoker -- 1.0 packs/day for 20 years    Types: Cigarettes    Quit date: 10/20/1988  . Smokeless tobacco: Never  Used  . Alcohol Use: No  . Drug Use: No  . Sexually Active: Yes -- Female partner(s)    Birth Control/ Protection: None     Comment: spouse   Other Topics Concern  . Not on file   Social History Narrative  . No narrative on file    Family History  Problem Relation Age of Onset  . Stroke Mother   . Cancer Father   . Anesthesia problems Neg Hx   . Hypotension Neg Hx   . Malignant hyperthermia Neg Hx   . Pseudochol deficiency Neg Hx     ROS: no nausea, vomiting; no fever, chills; no melena, hematochezia; no claudication  PHYSICAL EXAM: BP 122/82  Pulse 69  Ht 5\' 6"  (1.676 m)  Wt 176 lb (79.833 kg)  BMI 28.41 kg/m2  SpO2 96% GENERAL: 62 year old female, obese; NAD HEENT: NCAT, PERRLA, EOMI; sclera clear; no xanthelasma NECK: palpable bilateral carotid pulses, no bruits; no JVD; no TM LUNGS: CTA bilaterally CARDIAC: RRR (S1, S2);  crisp S2 click; no significant murmurs; no rubs or gallops ABDOMEN:  protuberant  EXTREMETIES: no significant peripheral edema SKIN: warm/dry; no obvious rash/lesions MUSCULOSKELETAL: no joint deformity NEURO: no focal deficit; NL affect   EKG: reviewed and available in Electronic Records   ASSESSMENT & PLAN:  AORTIC VALVE REPLACEMENT, HX OF Stable prosthetic aortic valve, by recent followup echocardiogram, January 9, with minimally increased transvalvular velocity and no significant regurgitation; EF 60-65%.  CAD, ARTERY BYPASS GRAFT Quiescent on current medication regimen  Atrial fibrillation Maintaining NSR, confirmed by EKG  Chronic venous insufficiency Patient is scheduled to undergo minimally invasive endoscopic laser surgery of her legs, later this month. I contacted the facility's staff, who reassured me that the patient does not need to hold Coumadin. If, however, she were to develop a complication requiring a more invasive approach, to be performed electively, then she would require Lovenox bridging, while off Coumadin. I then  explained this to the patient and reassured her that she can continue taking Coumadin, as directed, and to continue following with our Coumadin clinic, as scheduled.    Gene Asar Evilsizer, PAC

## 2012-11-30 ENCOUNTER — Ambulatory Visit (INDEPENDENT_AMBULATORY_CARE_PROVIDER_SITE_OTHER): Payer: BC Managed Care – PPO | Admitting: *Deleted

## 2012-11-30 DIAGNOSIS — Z7901 Long term (current) use of anticoagulants: Secondary | ICD-10-CM

## 2012-11-30 DIAGNOSIS — Z954 Presence of other heart-valve replacement: Secondary | ICD-10-CM

## 2012-11-30 DIAGNOSIS — I4891 Unspecified atrial fibrillation: Secondary | ICD-10-CM

## 2013-01-11 ENCOUNTER — Ambulatory Visit (INDEPENDENT_AMBULATORY_CARE_PROVIDER_SITE_OTHER): Payer: Medicare Other | Admitting: *Deleted

## 2013-01-11 DIAGNOSIS — I4891 Unspecified atrial fibrillation: Secondary | ICD-10-CM

## 2013-01-11 DIAGNOSIS — Z954 Presence of other heart-valve replacement: Secondary | ICD-10-CM

## 2013-01-11 DIAGNOSIS — Z7901 Long term (current) use of anticoagulants: Secondary | ICD-10-CM

## 2013-01-11 LAB — POCT INR: INR: 2.3

## 2013-02-22 ENCOUNTER — Ambulatory Visit (INDEPENDENT_AMBULATORY_CARE_PROVIDER_SITE_OTHER): Payer: Medicare Other | Admitting: *Deleted

## 2013-02-22 DIAGNOSIS — I4891 Unspecified atrial fibrillation: Secondary | ICD-10-CM

## 2013-02-22 DIAGNOSIS — Z7901 Long term (current) use of anticoagulants: Secondary | ICD-10-CM

## 2013-02-22 DIAGNOSIS — Z954 Presence of other heart-valve replacement: Secondary | ICD-10-CM

## 2013-02-22 LAB — POCT INR: INR: 1.9

## 2013-03-22 ENCOUNTER — Ambulatory Visit (INDEPENDENT_AMBULATORY_CARE_PROVIDER_SITE_OTHER): Payer: Medicare Other | Admitting: *Deleted

## 2013-03-22 DIAGNOSIS — Z954 Presence of other heart-valve replacement: Secondary | ICD-10-CM

## 2013-03-22 DIAGNOSIS — I4891 Unspecified atrial fibrillation: Secondary | ICD-10-CM

## 2013-03-22 DIAGNOSIS — Z7901 Long term (current) use of anticoagulants: Secondary | ICD-10-CM

## 2013-03-22 LAB — POCT INR: INR: 2.4

## 2013-04-12 ENCOUNTER — Ambulatory Visit (INDEPENDENT_AMBULATORY_CARE_PROVIDER_SITE_OTHER): Payer: Medicare Other | Admitting: *Deleted

## 2013-04-12 DIAGNOSIS — I4891 Unspecified atrial fibrillation: Secondary | ICD-10-CM

## 2013-04-12 DIAGNOSIS — Z7901 Long term (current) use of anticoagulants: Secondary | ICD-10-CM

## 2013-04-12 DIAGNOSIS — Z954 Presence of other heart-valve replacement: Secondary | ICD-10-CM

## 2013-04-14 ENCOUNTER — Encounter: Payer: Self-pay | Admitting: Internal Medicine

## 2013-04-15 ENCOUNTER — Encounter: Payer: Self-pay | Admitting: Gastroenterology

## 2013-04-15 ENCOUNTER — Ambulatory Visit (INDEPENDENT_AMBULATORY_CARE_PROVIDER_SITE_OTHER): Payer: Medicare Other | Admitting: Gastroenterology

## 2013-04-15 VITALS — BP 119/79 | HR 60 | Temp 97.4°F | Ht 67.0 in | Wt 178.8 lb

## 2013-04-15 DIAGNOSIS — R112 Nausea with vomiting, unspecified: Secondary | ICD-10-CM | POA: Insufficient documentation

## 2013-04-15 DIAGNOSIS — R1903 Right lower quadrant abdominal swelling, mass and lump: Secondary | ICD-10-CM | POA: Insufficient documentation

## 2013-04-15 DIAGNOSIS — D3A029 Benign carcinoid tumor of the large intestine, unspecified portion: Secondary | ICD-10-CM

## 2013-04-15 DIAGNOSIS — R1904 Left lower quadrant abdominal swelling, mass and lump: Secondary | ICD-10-CM | POA: Insufficient documentation

## 2013-04-15 NOTE — Progress Notes (Signed)
Primary Care Physician: Samuel Jester, DO  Primary Gastroenterologist:  Roetta Sessions, MD   Chief Complaint  Patient presents with  . Abdominal Pain    lower part    HPI: [name] is a 62 y.o. female here for further evaluation of abdominal discomfort, abdominal masses. 3 months ago, noticed two knots in abdomen. One is in the left lower quadrant and one in the right midabdomen. She has episodes with intermittent vomiting, especially if eats too much, then when bend over it comes up. No heartburn. BM sometimes very regular but other times not. No real hard stool. Now all thin to watery. No bleed or melena. Sometimes small BM 2-3 times per day but may skip a day. Denies any unintentional weight loss of and in fact has gained 8 pounds since we saw her in March of 2013. Denies any dysuria or hematuria. No fever, flushing.   Current Outpatient Prescriptions  Medication Sig Dispense Refill  . ALPRAZolam (XANAX XR) 1 MG 24 hr tablet Take 1 mg by mouth every morning.       Marland Kitchen atorvastatin (LIPITOR) 20 MG tablet Take 20 mg by mouth daily.       . calcium carbonate 200 MG capsule Take 1,600 mg by mouth daily.      . cetirizine (ZYRTEC) 10 MG tablet Take 10 mg by mouth daily.        . cyclobenzaprine (FLEXERIL) 10 MG tablet Take 10 mg by mouth daily.       Marland Kitchen HYDROcodone-acetaminophen (NORCO) 10-325 MG per tablet Take 1 tablet by mouth every 6 (six) hours as needed for pain.      . magnesium 30 MG tablet Take 30 mg by mouth daily.        . metoprolol succinate (TOPROL-XL) 25 MG 24 hr tablet Take 25 mg by mouth daily.        . multivitamin (THERAGRAN) per tablet Take 1 tablet by mouth daily.        Marland Kitchen omeprazole (PRILOSEC) 20 MG capsule Take 20 mg by mouth daily.       . ramipril (ALTACE) 2.5 MG capsule Take 2.5 mg by mouth daily.       Marland Kitchen rOPINIRole (REQUIP) 2 MG tablet Take 2 mg by mouth at bedtime.        . sertraline (ZOLOFT) 50 MG tablet Take 50 mg by mouth daily. Sleep      . temazepam  (RESTORIL) 30 MG capsule Take 30 mg by mouth at bedtime as needed.        . warfarin (COUMADIN) 2 MG tablet Take 2 mg by mouth daily. Takes one tablet (2mg ) everyday besides on Friday when she takes 2 tablets (4mg )       No current facility-administered medications for this visit.    Allergies as of 04/15/2013 - Review Complete 04/15/2013  Allergen Reaction Noted  . Penicillins Hives, Itching, Swelling, and Rash 06/05/2009    ROS:  General: Negative for anorexia, weight loss, fever, chills, fatigue, weakness. ENT: Negative for hoarseness, difficulty swallowing , nasal congestion. CV: Negative for chest pain, angina, palpitations, dyspnea on exertion, peripheral edema.  Respiratory: Negative for dyspnea at rest, dyspnea on exertion, cough, sputum, wheezing.  GI: See history of present illness. GU:  Negative for dysuria, hematuria, urinary incontinence, urinary frequency, nocturnal urination.  Endo: Negative for unusual weight change.    Physical Examination:   BP 119/79  Pulse 60  Temp(Src) 97.4 F (36.3 C) (Oral)  Ht 5\' 7"  (  1.702 m)  Wt 178 lb 12.8 oz (81.103 kg)  BMI 28 kg/m2  General: Well-nourished, well-developed in no acute distress.  Eyes: No icterus. Mouth: Oropharyngeal mucosa moist and pink , no lesions erythema or exudate. Lungs: Clear to auscultation bilaterally.  Heart: Regular rate and rhythm, no murmurs rubs or gallops.  Abdomen: Bowel sounds are normal, nondistended, no hepatosplenomegaly. Lemon-sized mass in the left lower quadrant and in the right mid abdomen. The one on the right side seems to reduce as if the hernia. Associated with tenderness with palpation but mild. no abdominal bruits , no rebound or guarding.   Extremities: No lower extremity edema. No clubbing or deformities. Neuro: Alert and oriented x 4   Skin: Warm and dry, no jaundice.   Psych: Alert and cooperative, normal mood and affect.

## 2013-04-15 NOTE — Patient Instructions (Addendum)
1. CT abdomen to further evaluation of abdominal masses. 2. Please have your blood work done. 3. I spoke with Prudy Feeler, PA-C (Dr. Charm Barges not available today). She said it was okay to do the CT and cancel the abdominal ultrasound.

## 2013-04-15 NOTE — Assessment & Plan Note (Signed)
62 year old lady with multiple abdominal surgeries for carcinoid tumor of the colon, appendectomy, bowel obstruction in 2007 who presents with 2 enlarging abdominal masses as outlined above. I suspect we are dealing with hernias, with some appreciation of reduction on the right side but not on the left. Patient tells me she is scheduled for abdominal ultrasound on Monday. I spoke with Prudy Feeler PA-C with Dr. Charm Barges (in her absence) regarding the patient. We will plan for a CT of the abdomen pelvis with contrast and cancel the abdominal ultrasound for now. Further recommendations to follow.

## 2013-04-18 NOTE — Progress Notes (Signed)
Cc PCP 

## 2013-04-20 ENCOUNTER — Ambulatory Visit (HOSPITAL_COMMUNITY)
Admission: RE | Admit: 2013-04-20 | Discharge: 2013-04-20 | Disposition: A | Discharge status: Home / Self Care | Payer: MEDICARE | Source: Ambulatory Visit | Attending: Gastroenterology | Admitting: Gastroenterology

## 2013-04-20 DIAGNOSIS — M5137 Other intervertebral disc degeneration, lumbosacral region: Secondary | ICD-10-CM | POA: Insufficient documentation

## 2013-04-20 DIAGNOSIS — R1909 Other intra-abdominal and pelvic swelling, mass and lump: Secondary | ICD-10-CM | POA: Insufficient documentation

## 2013-04-20 DIAGNOSIS — R112 Nausea with vomiting, unspecified: Secondary | ICD-10-CM

## 2013-04-20 DIAGNOSIS — R1903 Right lower quadrant abdominal swelling, mass and lump: Secondary | ICD-10-CM

## 2013-04-20 DIAGNOSIS — K439 Ventral hernia without obstruction or gangrene: Secondary | ICD-10-CM | POA: Insufficient documentation

## 2013-04-20 DIAGNOSIS — R1904 Left lower quadrant abdominal swelling, mass and lump: Secondary | ICD-10-CM

## 2013-04-20 DIAGNOSIS — M51379 Other intervertebral disc degeneration, lumbosacral region without mention of lumbar back pain or lower extremity pain: Secondary | ICD-10-CM | POA: Insufficient documentation

## 2013-04-20 DIAGNOSIS — D3A029 Benign carcinoid tumor of the large intestine, unspecified portion: Secondary | ICD-10-CM

## 2013-04-20 MED ORDER — IOHEXOL 300 MG/ML  SOLN
100.0000 mL | Freq: Once | INTRAMUSCULAR | Status: AC | PRN
  Administered 2013-04-20: 100 mL via INTRAVENOUS

## 2013-04-20 NOTE — Progress Notes (Signed)
Quick Note:  Two abdominal masses are hernias. Neither contain bowel at the time of CT but the one on the right is slightly larger and has small bowel just below it.   For bowel issues: Add fiber supplement such as Benefiber or Fiberchoice daily. Add Philips colon health or other probiotic.  For intermittent nausea: Do not overeat. Increase PPI to BID for one month to see if improvement. No need for repeat EGD at this time.  For hernia: We can refer her to general surgeon for consideration of surgery. I would recommend [facility] given the complexity of her prior surgeries UNLESS she has preference or has seen surgeon at tertiary care center like [facility].  For fatty liver: Instructions for fatty liver: Recommend 1-2# weight loss per week until ideal body weight through exercise & diet. Low fat/cholesterol diet.  Avoid sweets, sodas, fruit juices, sweetened beverages like tea, etc. Gradually increase exercise from 15 min daily up to 1 hr per day 5 days/week. Limit alcohol use.  OV with RMR in 2 months.   ______

## 2013-04-21 ENCOUNTER — Other Ambulatory Visit: Payer: Self-pay | Admitting: Gastroenterology

## 2013-04-21 DIAGNOSIS — K469 Unspecified abdominal hernia without obstruction or gangrene: Secondary | ICD-10-CM

## 2013-04-21 NOTE — Progress Notes (Signed)
Patient is scheduled with Dr. Derrell Lolling on Tues July 8th at 1:00 at [facility] and she is aware

## 2013-04-26 ENCOUNTER — Encounter: Payer: Self-pay | Admitting: Internal Medicine

## 2013-04-26 ENCOUNTER — Telehealth: Payer: Self-pay | Admitting: Cardiology

## 2013-04-26 NOTE — Telephone Encounter (Signed)
Pt PT INR scheduled for Thursday 7-10 at [facility]

## 2013-04-26 NOTE — Telephone Encounter (Signed)
Pt wants to know if she can come in and get coumadin level checked before she leaves for [place] on 04-29-2013. She said she went and seen Dr. Eliseo Squires and he said she had a hernia that need to be repaired she needs cardiac clearance. Operation will be scheduled after 8-31. Has an appointment on 9-3 with Dr. Diona Browner.

## 2013-04-27 ENCOUNTER — Ambulatory Visit (INDEPENDENT_AMBULATORY_CARE_PROVIDER_SITE_OTHER): Payer: Medicare Other | Admitting: General Surgery

## 2013-04-28 ENCOUNTER — Ambulatory Visit (INDEPENDENT_AMBULATORY_CARE_PROVIDER_SITE_OTHER): Payer: MEDICARE | Admitting: *Deleted

## 2013-04-28 DIAGNOSIS — Z954 Presence of other heart-valve replacement: Secondary | ICD-10-CM

## 2013-04-28 DIAGNOSIS — Z7901 Long term (current) use of anticoagulants: Secondary | ICD-10-CM

## 2013-04-28 DIAGNOSIS — I4891 Unspecified atrial fibrillation: Secondary | ICD-10-CM

## 2013-05-04 ENCOUNTER — Telehealth: Payer: Self-pay | Admitting: *Deleted

## 2013-05-04 DIAGNOSIS — Z954 Presence of other heart-valve replacement: Secondary | ICD-10-CM

## 2013-05-04 DIAGNOSIS — I4891 Unspecified atrial fibrillation: Secondary | ICD-10-CM

## 2013-05-04 NOTE — Telephone Encounter (Signed)
PT/INR orders faxed to [place]

## 2013-05-04 NOTE — Telephone Encounter (Signed)
Patient visiting in [place].  Requested to have PT/INR order faxed to [facility] at [phone].  They will be checking for July and August.

## 2013-05-04 NOTE — Telephone Encounter (Signed)
Needs order to have inr checked in [place]

## 2013-05-19 ENCOUNTER — Ambulatory Visit (INDEPENDENT_AMBULATORY_CARE_PROVIDER_SITE_OTHER): Payer: MEDICARE | Admitting: *Deleted

## 2013-05-19 DIAGNOSIS — Z954 Presence of other heart-valve replacement: Secondary | ICD-10-CM

## 2013-05-19 DIAGNOSIS — Z7901 Long term (current) use of anticoagulants: Secondary | ICD-10-CM

## 2013-05-19 DIAGNOSIS — I4891 Unspecified atrial fibrillation: Secondary | ICD-10-CM

## 2013-05-27 ENCOUNTER — Ambulatory Visit: Payer: MEDICARE | Admitting: Gastroenterology

## 2013-06-03 ENCOUNTER — Ambulatory Visit (INDEPENDENT_AMBULATORY_CARE_PROVIDER_SITE_OTHER): Payer: MEDICARE | Admitting: *Deleted

## 2013-06-03 DIAGNOSIS — Z7901 Long term (current) use of anticoagulants: Secondary | ICD-10-CM

## 2013-06-03 DIAGNOSIS — I4891 Unspecified atrial fibrillation: Secondary | ICD-10-CM

## 2013-06-03 DIAGNOSIS — Z954 Presence of other heart-valve replacement: Secondary | ICD-10-CM

## 2013-06-21 ENCOUNTER — Ambulatory Visit (INDEPENDENT_AMBULATORY_CARE_PROVIDER_SITE_OTHER): Payer: MEDICARE | Admitting: *Deleted

## 2013-06-21 DIAGNOSIS — Z954 Presence of other heart-valve replacement: Secondary | ICD-10-CM

## 2013-06-21 DIAGNOSIS — I4891 Unspecified atrial fibrillation: Secondary | ICD-10-CM

## 2013-06-21 DIAGNOSIS — Z7901 Long term (current) use of anticoagulants: Secondary | ICD-10-CM

## 2013-06-21 LAB — POCT INR: INR: 2.3

## 2013-06-22 ENCOUNTER — Encounter: Payer: Self-pay | Admitting: Cardiology

## 2013-06-22 ENCOUNTER — Ambulatory Visit (INDEPENDENT_AMBULATORY_CARE_PROVIDER_SITE_OTHER): Payer: MEDICARE | Admitting: Cardiology

## 2013-06-22 VITALS — BP 113/73 | HR 74 | Ht 68.0 in | Wt 183.0 lb

## 2013-06-22 DIAGNOSIS — E785 Hyperlipidemia, unspecified: Secondary | ICD-10-CM

## 2013-06-22 DIAGNOSIS — I2510 Atherosclerotic heart disease of native coronary artery without angina pectoris: Secondary | ICD-10-CM

## 2013-06-22 DIAGNOSIS — I4891 Unspecified atrial fibrillation: Secondary | ICD-10-CM

## 2013-06-22 DIAGNOSIS — Z954 Presence of other heart-valve replacement: Secondary | ICD-10-CM

## 2013-06-22 DIAGNOSIS — Z01810 Encounter for preprocedural cardiovascular examination: Secondary | ICD-10-CM | POA: Insufficient documentation

## 2013-06-22 NOTE — Assessment & Plan Note (Signed)
She is reporting potential side effects from Lipitor, has decided to start holding the medication. She is coordinating this with Dr. Charm Barges, will likely return to taking Crestor.

## 2013-06-22 NOTE — Assessment & Plan Note (Signed)
History of PAF, maintaining sinus rhythm. No complaint of palpitations.

## 2013-06-22 NOTE — Assessment & Plan Note (Signed)
Status post CABG in 2007 as outlined above. Clinically stable.

## 2013-06-22 NOTE — Assessment & Plan Note (Signed)
Patient being considered for elective herniorrhaphy with Dr. Cristy Folks, presumably general anesthesia. She has been clinically stable from a cardiac perspective without angina or CHF. She describes activities  exceeding 4 METs at baseline. Her ECG shows sinus rhythm with nonspecific ST-T changes. Mechanical aortic valve function was normal by her recent echocardiogram. Would not anticipate any further cardiac testing now. She will need to be bridged with Lovenox while Coumadin is held for surgery. This will be coordinated through our Coumadin clinic.

## 2013-06-22 NOTE — Assessment & Plan Note (Signed)
Mechanical St. Jude aortic prosthesis, stable by echocardiogram January of this year.

## 2013-06-22 NOTE — Progress Notes (Signed)
Clinical Summary [name] is a  62 y.o.female presenting for an office visit. She is a former patient of Dr. Andee Lineman, most recently seen by Mr. Serpe PA-C in January 2014. From a cardiac perspective she has been doing well. She reports no palpitations, stable NYHA class II dyspnea, no anginal chest pain. She reports that she is being considered for elective herniorrhaphy by Dr. Cristy Folks.  Echocardiogram from January of this year showed mild LVH with LVEF 60-65%, grade 1 diastolic dysfunction, stable St. Jude aortic prosthesis with mean gradient 8 mm mercury, mild to moderate left atrial enlargement, severely dilated right ventricle, mild tricuspid regurgitation, normal PASP. She has been compliant with her Coumadin, recent INR 2.3 follow through the Coumadin clinic.  ECG today shows sinus rhythm with nonspecific ST-T changes.  Otherwise has had some trouble with leg pain and swelling, has been concerned about Lipitor possibly being related. She has decided to stop Lipitor and will be trying to change back to Crestor with Dr. Charm Barges. She had tolerated Crestor previously.   Allergies  Allergen Reactions  . Penicillins Hives, Itching, Swelling and Rash    Throat swelling    Current Outpatient Prescriptions  Medication Sig Dispense Refill  . ALPRAZolam (XANAX XR) 1 MG 24 hr tablet Take 1 mg by mouth every morning.       . calcium carbonate (OS-CAL) 600 MG TABS tablet Take 600 mg by mouth 2 (two) times daily.      . cetirizine (ZYRTEC) 10 MG tablet Take 10 mg by mouth daily.        . cyclobenzaprine (FLEXERIL) 10 MG tablet Take 10 mg by mouth daily.       Marland Kitchen HYDROcodone-acetaminophen (NORCO) 10-325 MG per tablet Take 1 tablet by mouth every 6 (six) hours as needed for pain.      . magnesium 30 MG tablet Take 30 mg by mouth daily.        . metoprolol succinate (TOPROL-XL) 25 MG 24 hr tablet Take 25 mg by mouth daily.        . multivitamin (THERAGRAN) per tablet Take 1 tablet by mouth daily.         Marland Kitchen omeprazole (PRILOSEC) 20 MG capsule Take 20 mg by mouth daily.       . ramipril (ALTACE) 2.5 MG capsule Take 2.5 mg by mouth daily.       Marland Kitchen rOPINIRole (REQUIP) 2 MG tablet Take 2 mg by mouth at bedtime.        . temazepam (RESTORIL) 30 MG capsule Take 30 mg by mouth at bedtime as needed.        . warfarin (COUMADIN) 2 MG tablet Take 2 mg by mouth daily. Takes one tablet (2mg ) everyday besides on Friday when she takes 2 tablets (4mg )      . atorvastatin (LIPITOR) 20 MG tablet Take 20 mg by mouth daily.       . sertraline (ZOLOFT) 50 MG tablet Take 50 mg by mouth daily. Sleep       No current facility-administered medications for this visit.    Past Medical History  Diagnosis Date  . Hyperlipidemia   . Coronary atherosclerosis of native coronary artery     Status post CABG 2007  . Atrial fibrillation   . Macromastia   . Anxiety disorder   . GERD (gastroesophageal reflux disease)   . RLS (restless legs syndrome)   . Peptic stricture of esophagus 10/04/2010    GE junction on last EGD by  Dr. Jena Gauss, benign biopsies  . History of colonoscopy 2003    Dr. Jena Gauss - normal  . Schatzki's ring   . Carcinoid tumor of colon 2007  . Aortic stenosis     Status post St. Jude mechanical AVR 2007    Past Surgical History  Procedure Laterality Date  . Appendectomy  2007  . Teeth removal    . Cesarean section    . Abdominal hysterectomy    . Foot surgery      bilateral bunionectomy  . Breast cyst removed      bilateral  . Breast reduction surgery    . 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 Laparotomy  2007    small bowel resection secondary to small bowel obstruction  . Aortic valve replacement  2007    #25 mm St. 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 Esophagogastroduodenoscopy  10/03/2010    Dr. Jena Gauss- schatzki's ring, shoft peptic stricture at GE junction.  . Coronary artery bypass graft      06/2006 - RIMA to  RCA, SVG to RCA  . Back surgery      lumbar 4 and 5   . Maloney dilation  02/05/2012    Procedure: Elease Hashimoto DILATION;  Surgeon: Corbin Ade, MD;  Location: [facility];  Service: Endoscopy;  Laterality: N/A;  56mm   . Esophageal biopsy  02/05/2012    RMR:Two tongues of salmon-colored epithelium distal esophagus, very short-segment Barrett's s/p bx/Small hiatal hernia, otherwise normal stomach, D1, D2. Status post esophageal dilation. Biopsy showed GERD.  . Colonoscopy  02/05/2012    ZOX:WRUEAV rectum, sigmoid diverticulosis,descending colon polyp , tubular adenoma    Social History Ms. Linares reports that she quit smoking about 24 years ago. Her smoking use included Cigarettes. She has a 20 pack-year smoking history. She has never used smokeless tobacco. Ms. Schlossberg reports that she does not drink alcohol.  Review of Systems No palpitations or syncope. No bleeding problems. No orthopnea or PND. Otherwise as outlined above.  Physical Examination Filed Vitals:   06/22/13 1306  BP: 113/73  Pulse: 74   Filed Weights   06/22/13 1306  Weight: 183 lb (83.008 kg)   Appears comfortable at rest. HEENT: Conjunctiva and lids normal, oropharynx clear. Neck: Supple, no elevated JVP or carotid bruits, no thyromegaly. Lungs: Clear to auscultation, nonlabored breathing at rest. Cardiac: Regular rate and rhythm, no S3 crisp mechanical prosthetic click in S2., no pericardial rub. Abdomen: Soft, nontender,bowel sounds present, no guarding or rebound. Extremities: Trace edema, distal pulses 2+. Skin: Warm and dry. Musculoskeletal: No kyphosis. Neuropsychiatric: Alert and oriented x3, affect grossly appropriate.   Problem List and Plan   Preoperative cardiovascular examination Patient being considered for elective herniorrhaphy with Dr. Cristy Folks, presumably general anesthesia. She has been clinically stable from a cardiac perspective without angina or CHF. She describes activities  exceeding 4 METs at  baseline. Her ECG shows sinus rhythm with nonspecific ST-T changes. Mechanical aortic valve function was normal by her recent echocardiogram. Would not anticipate any further cardiac testing now. She will need to be bridged with Lovenox while Coumadin is held for surgery. This will be coordinated through our Coumadin clinic.  Atrial fibrillation History of PAF, maintaining sinus rhythm. No complaint of palpitations.  Coronary atherosclerosis of native coronary artery Status post CABG in 2007 as outlined above. Clinically stable.  HYPERLIPIDEMIA-MIXED She is reporting potential side effects from Lipitor, has decided to  start holding the medication. She is coordinating this with Dr. Charm Barges, will likely return to taking Crestor.  AORTIC VALVE REPLACEMENT, HX OF Mechanical St. Jude aortic prosthesis, stable by echocardiogram January of this year.    Jonelle Sidle, M.D., F.A.C.C.

## 2013-06-22 NOTE — Patient Instructions (Addendum)
Your physician recommends that you schedule a follow-up appointment in: 3 months. Your physician recommends that you continue on your current medications as directed. Please refer to the Current Medication list given to you today.  Continue holding lipitor and follow directions from Dr. Charm Barges. Misty Stanley will contact you about your warfarin/lovenox bridging prior to your surgery.

## 2013-06-27 ENCOUNTER — Telehealth: Payer: Self-pay | Admitting: *Deleted

## 2013-06-27 NOTE — Telephone Encounter (Signed)
Questions answered.  I am waiting on surgery date from Dr Johny Chess so Lovenox instructions can be given to patient.  Pt will let me know when surgery is scheduled.

## 2013-06-27 NOTE — Telephone Encounter (Signed)
QUESTIONS ABOUT LOVANOX BRIDGING

## 2013-07-05 ENCOUNTER — Ambulatory Visit (INDEPENDENT_AMBULATORY_CARE_PROVIDER_SITE_OTHER): Payer: MEDICARE | Admitting: *Deleted

## 2013-07-05 DIAGNOSIS — I4891 Unspecified atrial fibrillation: Secondary | ICD-10-CM

## 2013-07-05 DIAGNOSIS — Z954 Presence of other heart-valve replacement: Secondary | ICD-10-CM

## 2013-07-05 DIAGNOSIS — Z7901 Long term (current) use of anticoagulants: Secondary | ICD-10-CM

## 2013-07-05 LAB — POCT INR: INR: 2.3

## 2013-07-05 MED ORDER — ENOXAPARIN SODIUM 80 MG/0.8ML ~~LOC~~ SOLN: 80.0000 mg | Freq: Two times a day (BID) | SUBCUTANEOUS | Status: DC

## 2013-07-05 NOTE — Patient Instructions (Addendum)
9/18  Last dose of coumadin 9/19  No lovenox or coumadin 9/20  Lovenox 80mg  SQ 8am & 8pm 9/21   Lovenox 80mg  SQ 8am & 8pm 9/22   Lovenox 80mg  SQ 8am & 8pm 9/23   Lovenox 80mg  SQ 8am & 8pm 9/24  No lovenox----surgery-----Lovenox 80mg  8pm and coumadin 4mg  (if OK with Dr Johny Chess and pt not in hospital) 9/25   Lovenox 80mg  SQ 8am & 8pm/ coumadin 4mg  5pm 9/26    Lovenox 80mg  SQ 8am & 8pm/ coumadin 4mg  5pm 9/27    Lovenox 80mg  SQ 8am & 8pm/ coumadin 2mg  5pm 9/28    Lovenox 80mg  SQ 8am & 8pm/ coumadin 2mg  5pm 9/29    Lovenox 80mg  SQ 8am & 8pm/ coumadin 2mg  5pm 9/30    Lovenox 80mg  SQ 8am ------INR appt @ 11:10am

## 2013-07-13 ENCOUNTER — Encounter: Payer: Self-pay | Admitting: Cardiology

## 2013-07-14 ENCOUNTER — Encounter: Payer: Self-pay | Admitting: Cardiology

## 2013-07-19 ENCOUNTER — Encounter (INDEPENDENT_AMBULATORY_CARE_PROVIDER_SITE_OTHER): Payer: Self-pay

## 2013-07-26 ENCOUNTER — Ambulatory Visit (INDEPENDENT_AMBULATORY_CARE_PROVIDER_SITE_OTHER): Payer: MEDICARE | Admitting: *Deleted

## 2013-07-26 DIAGNOSIS — Z954 Presence of other heart-valve replacement: Secondary | ICD-10-CM

## 2013-07-26 DIAGNOSIS — I4891 Unspecified atrial fibrillation: Secondary | ICD-10-CM

## 2013-07-26 DIAGNOSIS — Z7901 Long term (current) use of anticoagulants: Secondary | ICD-10-CM

## 2013-08-09 ENCOUNTER — Ambulatory Visit (INDEPENDENT_AMBULATORY_CARE_PROVIDER_SITE_OTHER): Payer: MEDICARE | Admitting: *Deleted

## 2013-08-09 DIAGNOSIS — Z7901 Long term (current) use of anticoagulants: Secondary | ICD-10-CM

## 2013-08-09 DIAGNOSIS — Z954 Presence of other heart-valve replacement: Secondary | ICD-10-CM

## 2013-08-09 DIAGNOSIS — I4891 Unspecified atrial fibrillation: Secondary | ICD-10-CM

## 2013-08-09 LAB — POCT INR: INR: 1.4

## 2013-08-12 ENCOUNTER — Ambulatory Visit (INDEPENDENT_AMBULATORY_CARE_PROVIDER_SITE_OTHER): Payer: MEDICARE | Admitting: *Deleted

## 2013-08-12 DIAGNOSIS — I4891 Unspecified atrial fibrillation: Secondary | ICD-10-CM

## 2013-08-12 DIAGNOSIS — Z954 Presence of other heart-valve replacement: Secondary | ICD-10-CM

## 2013-08-12 DIAGNOSIS — Z7901 Long term (current) use of anticoagulants: Secondary | ICD-10-CM

## 2013-08-12 LAB — POCT INR: INR: 1.6

## 2013-08-19 ENCOUNTER — Ambulatory Visit (INDEPENDENT_AMBULATORY_CARE_PROVIDER_SITE_OTHER): Payer: MEDICARE | Admitting: *Deleted

## 2013-08-19 DIAGNOSIS — I4891 Unspecified atrial fibrillation: Secondary | ICD-10-CM

## 2013-08-19 DIAGNOSIS — Z7901 Long term (current) use of anticoagulants: Secondary | ICD-10-CM

## 2013-08-19 DIAGNOSIS — Z954 Presence of other heart-valve replacement: Secondary | ICD-10-CM

## 2013-08-19 LAB — POCT INR: INR: 1.9

## 2013-08-26 ENCOUNTER — Ambulatory Visit (INDEPENDENT_AMBULATORY_CARE_PROVIDER_SITE_OTHER): Payer: MEDICARE | Admitting: *Deleted

## 2013-08-26 DIAGNOSIS — Z954 Presence of other heart-valve replacement: Secondary | ICD-10-CM

## 2013-08-26 DIAGNOSIS — Z7901 Long term (current) use of anticoagulants: Secondary | ICD-10-CM

## 2013-08-26 DIAGNOSIS — I4891 Unspecified atrial fibrillation: Secondary | ICD-10-CM

## 2013-08-26 LAB — POCT INR: INR: 2

## 2013-09-09 ENCOUNTER — Ambulatory Visit (INDEPENDENT_AMBULATORY_CARE_PROVIDER_SITE_OTHER): Payer: MEDICARE | Admitting: *Deleted

## 2013-09-09 DIAGNOSIS — I4891 Unspecified atrial fibrillation: Secondary | ICD-10-CM

## 2013-09-09 DIAGNOSIS — Z954 Presence of other heart-valve replacement: Secondary | ICD-10-CM

## 2013-09-09 DIAGNOSIS — Z7901 Long term (current) use of anticoagulants: Secondary | ICD-10-CM

## 2013-09-09 LAB — POCT INR: INR: 2.6

## 2013-09-21 ENCOUNTER — Encounter: Payer: Self-pay | Admitting: Cardiology

## 2013-09-21 ENCOUNTER — Ambulatory Visit (INDEPENDENT_AMBULATORY_CARE_PROVIDER_SITE_OTHER): Payer: MEDICARE | Admitting: Cardiology

## 2013-09-21 ENCOUNTER — Ambulatory Visit (INDEPENDENT_AMBULATORY_CARE_PROVIDER_SITE_OTHER): Payer: MEDICARE | Admitting: *Deleted

## 2013-09-21 VITALS — BP 122/75 | HR 64 | Ht 68.0 in | Wt 175.0 lb

## 2013-09-21 DIAGNOSIS — Z7901 Long term (current) use of anticoagulants: Secondary | ICD-10-CM

## 2013-09-21 DIAGNOSIS — Z954 Presence of other heart-valve replacement: Secondary | ICD-10-CM

## 2013-09-21 DIAGNOSIS — I2510 Atherosclerotic heart disease of native coronary artery without angina pectoris: Secondary | ICD-10-CM

## 2013-09-21 DIAGNOSIS — I4891 Unspecified atrial fibrillation: Secondary | ICD-10-CM

## 2013-09-21 DIAGNOSIS — E785 Hyperlipidemia, unspecified: Secondary | ICD-10-CM

## 2013-09-21 NOTE — Progress Notes (Signed)
Clinical Summary Ms. Oceguera is a 62 y.o.female last seen in September. She underwent interval laparoscopic incisional hernia repair with Dr. Cristy Folks at [facility], also lysis of adhesions. Records indicate postoperative atrial fibrillation with RVR and pulmonary edema, also anemia requiring PRBCs. She was seen by the [facility] cardiology team, ultimately transferred to [facility]. Records indicate that she had evidence of a retroperitoneal hematoma, required additional PRBCs,  spontaneously converted to sinus rhythm at some point during her care. She was ultimately able to resume Coumadin.  She presents today, stating that she is doing fairly well. She has been undergoing rehabilitation, mainly with orthopedic complaints. She has had problems with her right knee and also her left shoulder, possibly rotator cuff. She is using a cane.  No palpitations or chest pain of late. She continues to follow in Coumadin clinic, most recent INR was 2.6. No bleeding problems on Coumadin. She has had interval followup with Dr. Charm Barges.  Echocardiogram from January of this year showed mild LVH with LVEF 60-65%, grade 1 diastolic dysfunction, stable St. Jude aortic prosthesis with mean gradient 8 mm mercury, mild to moderate left atrial enlargement, severely dilated right ventricle, mild tricuspid regurgitation, normal PASP.   Allergies  Allergen Reactions  . Penicillins Hives, Itching, Swelling and Rash    Throat swelling    Current Outpatient Prescriptions  Medication Sig Dispense Refill  . albuterol (PROVENTIL HFA;VENTOLIN HFA) 108 (90 BASE) MCG/ACT inhaler Inhale into the lungs as needed for wheezing or shortness of breath.      . ALPRAZolam (XANAX XR) 1 MG 24 hr tablet Take 1 mg by mouth every morning.       . calcium carbonate (OS-CAL) 600 MG TABS tablet Take 600 mg by mouth 2 (two) times daily.      . cetirizine (ZYRTEC) 10 MG tablet Take 10 mg by mouth daily.        . cyclobenzaprine (FLEXERIL) 10  MG tablet Take 10 mg by mouth as needed.       Marland Kitchen HYDROcodone-acetaminophen (NORCO) 10-325 MG per tablet Take 1 tablet by mouth at bedtime as needed.       . metoprolol succinate (TOPROL-XL) 25 MG 24 hr tablet Take 25 mg by mouth daily.        . multivitamin (THERAGRAN) per tablet Take 1 tablet by mouth daily.        Marland Kitchen omeprazole (PRILOSEC) 20 MG capsule Take 20 mg by mouth daily.       . ramipril (ALTACE) 2.5 MG capsule Take 2.5 mg by mouth daily.       Marland Kitchen rOPINIRole (REQUIP) 2 MG tablet Take 2 mg by mouth at bedtime.        . temazepam (RESTORIL) 30 MG capsule Take 30 mg by mouth at bedtime as needed.       . warfarin (COUMADIN) 2 MG tablet Take 2 mg by mouth daily. Takes one tablet (2mg ) everyday besides on Friday when she takes 2 tablets (4mg )       No current facility-administered medications for this visit.    Past Medical History  Diagnosis Date  . Hyperlipidemia   . Coronary atherosclerosis of native coronary artery     Status post CABG 2007  . Atrial fibrillation   . Macromastia   . Anxiety disorder   . GERD (gastroesophageal reflux disease)   . RLS (restless legs syndrome)   . Peptic stricture of esophagus 10/04/2010    GE junction on last EGD by Dr. Jena Gauss,  benign biopsies  . History of colonoscopy 2003    Dr. Jena Gauss - normal  . Schatzki's ring   . Carcinoid tumor of colon 2007  . Aortic stenosis     Status post St. Jude mechanical AVR 2007    Past Surgical History  Procedure Laterality Date  . Appendectomy  2007  . Teeth removal    . Cesarean section    . Abdominal hysterectomy    . Foot surgery      bilateral bunionectomy  . Breast cyst removed      bilateral  . Breast reduction surgery    . 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 Laparotomy  2007    small bowel resection secondary to small bowel obstruction  . Aortic valve replacement  2007    #25 mm St. 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 Esophagogastroduodenoscopy  10/03/2010    Dr. Jena Gauss- schatzki's ring, shoft peptic stricture at GE junction.  . Coronary artery bypass graft      06/2006 - RIMA to RCA, SVG to RCA  . Back surgery      lumbar 4 and 5   . Maloney dilation  02/05/2012    Procedure: Elease Hashimoto DILATION;  Surgeon: Corbin Ade, MD;  Location: [facility];  Service: Endoscopy;  Laterality: N/A;  56mm   . Esophageal biopsy  02/05/2012    RMR:Two tongues of salmon-colored epithelium distal esophagus, very short-segment Barrett's s/p bx/Small hiatal hernia, otherwise normal stomach, D1, D2. Status post esophageal dilation. Biopsy showed GERD.  . Colonoscopy  02/05/2012    WUJ:WJXBJY rectum, sigmoid diverticulosis,descending colon polyp , tubular adenoma    Social History Ms. Felix reports that she quit smoking about 24 years ago. Her smoking use included Cigarettes. She has a 20 pack-year smoking history. She has never used smokeless tobacco. Ms. Jalomo reports that she does not drink alcohol.  Review of Systems Negative except as outlined.  Physical Examination Filed Vitals:   09/21/13 1024  BP: 122/75  Pulse: 64   Filed Weights   09/21/13 1024  Weight: 175 lb (79.379 kg)    Appears comfortable at rest.  HEENT: Conjunctiva and lids normal, oropharynx clear.  Neck: Supple, no elevated JVP or carotid bruits, no thyromegaly.  Lungs: Clear to auscultation, nonlabored breathing at rest.  Cardiac: Regular rate and rhythm, no S3 with crisp mechanical prosthetic click in S2., no pericardial rub.  Abdomen: Soft, nontender,bowel sounds present, no guarding or rebound.  Extremities: Trace edema, distal pulses 2+. A few ecchymoses on right knee. Skin: Warm and dry.  Musculoskeletal: No kyphosis.  Neuropsychiatric: Alert and oriented x3, affect grossly appropriate.   Problem List and Plan   Atrial fibrillation Paroxysmal, in sinus rhythm on examination today. No palpitations. Continue beta blocker and  Coumadin.  Coronary atherosclerosis of native coronary artery No active angina symptoms status post prior CABG. Continue observation.  AORTIC VALVE REPLACEMENT, HX OF History of aortic stenosis status post St. Jude mechanical AVR in 2007. Continues to follow for the Coumadin clinic. Recent echocardiogram noted above.  HYPERLIPIDEMIA-MIXED Not on statin therapy at this time. Had previously been on Lipitor. Keep followup with Dr. Charm Barges.    Jonelle Sidle, M.D., F.A.C.C.

## 2013-09-21 NOTE — Assessment & Plan Note (Signed)
Not on statin therapy at this time. Had previously been on Lipitor. Keep followup with Dr. Charm Barges.

## 2013-09-21 NOTE — Assessment & Plan Note (Signed)
Paroxysmal, in sinus rhythm on examination today. No palpitations. Continue beta blocker and Coumadin. 

## 2013-09-21 NOTE — Assessment & Plan Note (Signed)
No active angina symptoms status post prior CABG. Continue observation.

## 2013-09-21 NOTE — Assessment & Plan Note (Signed)
History of aortic stenosis status post St. Jude mechanical AVR in 2007. Continues to follow for the Coumadin clinic. Recent echocardiogram noted above.

## 2013-09-21 NOTE — Patient Instructions (Signed)
Your physician recommends that you schedule a follow-up appointment in: 4 months. You will receive a reminder letter in the mail in about 2 months reminding you to call and schedule your appointment. If you don't receive this letter, please contact our office. Your physician recommends that you continue on your current medications as directed. Please refer to the Current Medication list given to you today. 

## 2013-09-23 ENCOUNTER — Ambulatory Visit (INDEPENDENT_AMBULATORY_CARE_PROVIDER_SITE_OTHER): Payer: MEDICARE | Admitting: *Deleted

## 2013-09-23 DIAGNOSIS — I4891 Unspecified atrial fibrillation: Secondary | ICD-10-CM

## 2013-09-23 DIAGNOSIS — Z7901 Long term (current) use of anticoagulants: Secondary | ICD-10-CM

## 2013-09-23 DIAGNOSIS — Z954 Presence of other heart-valve replacement: Secondary | ICD-10-CM

## 2013-09-23 LAB — POCT INR: INR: 2.5

## 2013-10-04 ENCOUNTER — Ambulatory Visit (INDEPENDENT_AMBULATORY_CARE_PROVIDER_SITE_OTHER): Payer: MEDICARE | Admitting: *Deleted

## 2013-10-04 DIAGNOSIS — Z954 Presence of other heart-valve replacement: Secondary | ICD-10-CM

## 2013-10-04 DIAGNOSIS — Z7901 Long term (current) use of anticoagulants: Secondary | ICD-10-CM

## 2013-10-04 DIAGNOSIS — I4891 Unspecified atrial fibrillation: Secondary | ICD-10-CM

## 2013-10-04 LAB — POCT INR: INR: 2.5

## 2013-10-21 NOTE — Progress Notes (Signed)
REVIEWED.  

## 2013-10-25 ENCOUNTER — Ambulatory Visit (INDEPENDENT_AMBULATORY_CARE_PROVIDER_SITE_OTHER): Payer: MEDICARE | Admitting: *Deleted

## 2013-10-25 DIAGNOSIS — Z7901 Long term (current) use of anticoagulants: Secondary | ICD-10-CM

## 2013-10-25 DIAGNOSIS — Z954 Presence of other heart-valve replacement: Secondary | ICD-10-CM

## 2013-10-25 DIAGNOSIS — I4891 Unspecified atrial fibrillation: Secondary | ICD-10-CM

## 2013-10-25 LAB — POCT INR: INR: 3.7

## 2013-11-15 ENCOUNTER — Ambulatory Visit (INDEPENDENT_AMBULATORY_CARE_PROVIDER_SITE_OTHER): Payer: MEDICARE | Admitting: *Deleted

## 2013-11-15 DIAGNOSIS — Z954 Presence of other heart-valve replacement: Secondary | ICD-10-CM

## 2013-11-15 DIAGNOSIS — I4891 Unspecified atrial fibrillation: Secondary | ICD-10-CM

## 2013-11-15 DIAGNOSIS — Z7901 Long term (current) use of anticoagulants: Secondary | ICD-10-CM

## 2013-11-15 DIAGNOSIS — Z5181 Encounter for therapeutic drug level monitoring: Secondary | ICD-10-CM | POA: Insufficient documentation

## 2013-11-15 LAB — POCT INR: INR: 3

## 2013-12-20 ENCOUNTER — Ambulatory Visit (INDEPENDENT_AMBULATORY_CARE_PROVIDER_SITE_OTHER): Payer: MEDICARE | Admitting: *Deleted

## 2013-12-20 DIAGNOSIS — Z7901 Long term (current) use of anticoagulants: Secondary | ICD-10-CM

## 2013-12-20 DIAGNOSIS — Z5181 Encounter for therapeutic drug level monitoring: Secondary | ICD-10-CM

## 2013-12-20 DIAGNOSIS — I4891 Unspecified atrial fibrillation: Secondary | ICD-10-CM

## 2013-12-20 DIAGNOSIS — Z954 Presence of other heart-valve replacement: Secondary | ICD-10-CM

## 2013-12-20 LAB — POCT INR: INR: 3.9

## 2014-01-10 ENCOUNTER — Ambulatory Visit (INDEPENDENT_AMBULATORY_CARE_PROVIDER_SITE_OTHER): Payer: MEDICARE | Admitting: *Deleted

## 2014-01-10 DIAGNOSIS — Z7901 Long term (current) use of anticoagulants: Secondary | ICD-10-CM

## 2014-01-10 DIAGNOSIS — Z5181 Encounter for therapeutic drug level monitoring: Secondary | ICD-10-CM

## 2014-01-10 DIAGNOSIS — I4891 Unspecified atrial fibrillation: Secondary | ICD-10-CM

## 2014-01-10 DIAGNOSIS — Z954 Presence of other heart-valve replacement: Secondary | ICD-10-CM

## 2014-01-10 LAB — POCT INR: INR: 3.9

## 2014-01-23 ENCOUNTER — Ambulatory Visit (INDEPENDENT_AMBULATORY_CARE_PROVIDER_SITE_OTHER): Payer: MEDICARE | Admitting: Cardiology

## 2014-01-23 ENCOUNTER — Encounter: Payer: Self-pay | Admitting: Cardiology

## 2014-01-23 VITALS — BP 119/83 | HR 73 | Ht 68.0 in | Wt 180.0 lb

## 2014-01-23 DIAGNOSIS — I2510 Atherosclerotic heart disease of native coronary artery without angina pectoris: Secondary | ICD-10-CM

## 2014-01-23 DIAGNOSIS — Z954 Presence of other heart-valve replacement: Secondary | ICD-10-CM

## 2014-01-23 DIAGNOSIS — I4891 Unspecified atrial fibrillation: Secondary | ICD-10-CM

## 2014-01-23 NOTE — Assessment & Plan Note (Signed)
Paroxysmal, in sinus rhythm on examination today. No palpitations. Continue beta blocker and Coumadin.

## 2014-01-23 NOTE — Progress Notes (Signed)
Clinical Summary [name] is a 63 y.o.female last seen in December 2014. She reports no palpitations, no chest pain or shortness of breath. States she has been compliant with her medications including Coumadin, no reported bleeding problems.  Echocardiogram from January 2014 showed mild LVH with LVEF 62-95%, grade 1 diastolic dysfunction, stable St. Jude aortic prosthesis with mean gradient 8 mm mercury, mild to moderate left atrial enlargement, severely dilated right ventricle, mild tricuspid regurgitation, normal PASP.   Allergies  Allergen Reactions  . Penicillins Hives, Itching, Swelling and Rash    Throat swelling    Current Outpatient Prescriptions  Medication Sig Dispense Refill  . albuterol (PROVENTIL HFA;VENTOLIN HFA) 108 (90 BASE) MCG/ACT inhaler Inhale into the lungs as needed for wheezing or shortness of breath.      . ALPRAZolam (XANAX XR) 1 MG 24 hr tablet Take 1 mg by mouth every morning.       . calcium carbonate (OS-CAL) 600 MG TABS tablet Take 600 mg by mouth 2 (two) times daily.      . cetirizine (ZYRTEC) 10 MG tablet Take 10 mg by mouth daily.        . CRESTOR 10 MG tablet Take 10 mg by mouth daily.      . cyclobenzaprine (FLEXERIL) 10 MG tablet Take 10 mg by mouth as needed.       . furosemide (LASIX) 40 MG tablet Take 40 mg by mouth daily.      Marland Kitchen guaiFENesin-codeine (ROBITUSSIN AC) 100-10 MG/5ML syrup Take 10 mLs by mouth as needed.      Marland Kitchen HYDROcodone-acetaminophen (NORCO) 10-325 MG per tablet Take 1 tablet by mouth at bedtime as needed.       . Magnesium Chloride (MAG-DELAY) 535 (64 MG) MG TBCR Take 1 tablet by mouth 2 (two) times daily.      . metoprolol succinate (TOPROL-XL) 25 MG 24 hr tablet Take 25 mg by mouth daily.        . multivitamin (THERAGRAN) per tablet Take 1 tablet by mouth daily.        Marland Kitchen omeprazole (PRILOSEC) 20 MG capsule Take 20 mg by mouth daily.       . ramipril (ALTACE) 2.5 MG capsule Take 2.5 mg by mouth daily.       Marland Kitchen rOPINIRole  (REQUIP) 2 MG tablet Take 2 mg by mouth at bedtime.        . temazepam (RESTORIL) 30 MG capsule Take 30 mg by mouth at bedtime as needed.       . warfarin (COUMADIN) 2 MG tablet Take 2 mg by mouth daily. Takes one tablet (2mg ) everyday besides on Friday when she takes 2 tablets (4mg )       No current facility-administered medications for this visit.    Past Medical History  Diagnosis Date  . Hyperlipidemia   . Coronary atherosclerosis of native coronary artery     Status post CABG 2007  . Atrial fibrillation   . Macromastia   . Anxiety disorder   . GERD (gastroesophageal reflux disease)   . RLS (restless legs syndrome)   . Peptic stricture of esophagus 10/04/2010    GE junction on last EGD by Dr. Gala Romney, benign biopsies  . History of colonoscopy 2003    Dr. Gala Romney - normal  . Schatzki's ring   . Carcinoid tumor of colon 2007  . Aortic stenosis     Status post St. Jude mechanical AVR 2007    Social History Ms. Jaroszewski reports  that she quit smoking about 25 years ago. Her smoking use included Cigarettes. She has a 20 pack-year smoking history. She has never used smokeless tobacco. Ms. Jetter reports that she does not drink alcohol.  Review of Systems Negative except as outlined.  Physical Examination Filed Vitals:   01/23/14 1149  BP: 119/83  Pulse: 73   Filed Weights   01/23/14 1149  Weight: 180 lb (81.647 kg)    Appears comfortable at rest.  HEENT: Conjunctiva and lids normal, oropharynx clear.  Neck: Supple, no elevated JVP or carotid bruits, no thyromegaly.  Lungs: Clear to auscultation, nonlabored breathing at rest.  Cardiac: Regular rate and rhythm, no S3 with crisp mechanical prosthetic click in S2., no pericardial rub.  Abdomen: Soft, nontender,bowel sounds present, no guarding or rebound.  Extremities: Trace edema, distal pulses 2+.   Problem List and Plan   AORTIC VALVE REPLACEMENT, HX OF St. Jude mechanical AVR 2007, stable by echocardiogram last year.  No change in examination.  Atrial fibrillation Paroxysmal, in sinus rhythm on examination today. No palpitations. Continue beta blocker and Coumadin.  Coronary atherosclerosis of native coronary artery No active angina symptoms status post CABG in 2007.    Satira Sark, M.D., F.A.C.C.

## 2014-01-23 NOTE — Assessment & Plan Note (Signed)
St. Jude mechanical AVR 2007, stable by echocardiogram last year. No change in examination.

## 2014-01-23 NOTE — Patient Instructions (Signed)
Continue all current medications. Follow up in  4 months  

## 2014-01-23 NOTE — Assessment & Plan Note (Signed)
No active angina symptoms status post CABG in 2007.

## 2014-01-31 ENCOUNTER — Ambulatory Visit (INDEPENDENT_AMBULATORY_CARE_PROVIDER_SITE_OTHER): Payer: MEDICARE | Admitting: *Deleted

## 2014-01-31 DIAGNOSIS — I4891 Unspecified atrial fibrillation: Secondary | ICD-10-CM

## 2014-01-31 DIAGNOSIS — Z5181 Encounter for therapeutic drug level monitoring: Secondary | ICD-10-CM

## 2014-01-31 DIAGNOSIS — Z954 Presence of other heart-valve replacement: Secondary | ICD-10-CM

## 2014-01-31 DIAGNOSIS — Z7901 Long term (current) use of anticoagulants: Secondary | ICD-10-CM

## 2014-01-31 LAB — POCT INR: INR: 2.2

## 2014-02-28 ENCOUNTER — Ambulatory Visit (INDEPENDENT_AMBULATORY_CARE_PROVIDER_SITE_OTHER): Payer: BC Managed Care – PPO | Admitting: *Deleted

## 2014-02-28 DIAGNOSIS — I4891 Unspecified atrial fibrillation: Secondary | ICD-10-CM

## 2014-02-28 DIAGNOSIS — Z7901 Long term (current) use of anticoagulants: Secondary | ICD-10-CM

## 2014-02-28 DIAGNOSIS — Z954 Presence of other heart-valve replacement: Secondary | ICD-10-CM

## 2014-02-28 DIAGNOSIS — Z5181 Encounter for therapeutic drug level monitoring: Secondary | ICD-10-CM

## 2014-02-28 LAB — POCT INR: INR: 2.3

## 2014-03-02 ENCOUNTER — Telehealth: Payer: Self-pay | Admitting: *Deleted

## 2014-03-02 NOTE — Telephone Encounter (Signed)
Spoke with patient on 02/28/14 - stated that she was at the beach the previous week.  Had some feet and hand swelling.  Stated that she had went to an urgent care there & they advised her to take extra fluid medicine x 2 days and discuss with MD when she got home.  Stated that Dr. Melina Copa had previously changed her Furosemide to Torsemide.  She is due for follow up with Dr. Melina Copa again the end of this month.  Stated that she is feeling better now that she is back home, but does still have a little feet swelling.  Patient concerned that she may need to see you before her August recall date.

## 2014-03-03 NOTE — Telephone Encounter (Signed)
She should go ahead and see Dr. Melina Copa for planned visit later this month. It may just be an issue of adjusting her dose of torsemide. If things do not improve however, we can certainly see her back before August.

## 2014-03-07 NOTE — Telephone Encounter (Signed)
Patient notified

## 2014-04-04 ENCOUNTER — Ambulatory Visit (INDEPENDENT_AMBULATORY_CARE_PROVIDER_SITE_OTHER): Payer: BC Managed Care – PPO | Admitting: *Deleted

## 2014-04-04 DIAGNOSIS — Z954 Presence of other heart-valve replacement: Secondary | ICD-10-CM

## 2014-04-04 DIAGNOSIS — Z7901 Long term (current) use of anticoagulants: Secondary | ICD-10-CM

## 2014-04-04 DIAGNOSIS — I4891 Unspecified atrial fibrillation: Secondary | ICD-10-CM

## 2014-04-04 DIAGNOSIS — Z5181 Encounter for therapeutic drug level monitoring: Secondary | ICD-10-CM

## 2014-04-04 LAB — POCT INR: INR: 3.5

## 2014-04-05 ENCOUNTER — Telehealth: Payer: Self-pay | Admitting: *Deleted

## 2014-04-05 NOTE — Telephone Encounter (Signed)
Pt called stating she needs to have dental extraction and wants to know what to do about coumadin.  Pt has a Mechanical Aortic Valve and would need bridging with Lovenox if she had to come off coumadin for several days.  Would recommend dentist pulling tooth on coumadin with INR down close to 2.0 if dentist is in agreement.  Fax to [facility] [phone]

## 2014-05-02 ENCOUNTER — Ambulatory Visit (INDEPENDENT_AMBULATORY_CARE_PROVIDER_SITE_OTHER): Payer: BC Managed Care – PPO | Admitting: *Deleted

## 2014-05-02 DIAGNOSIS — I4891 Unspecified atrial fibrillation: Secondary | ICD-10-CM

## 2014-05-02 DIAGNOSIS — Z5181 Encounter for therapeutic drug level monitoring: Secondary | ICD-10-CM

## 2014-05-02 DIAGNOSIS — Z954 Presence of other heart-valve replacement: Secondary | ICD-10-CM

## 2014-05-02 LAB — PROTIME-INR: INR: 2.4 — AB (ref 0.9–1.1)

## 2014-06-01 ENCOUNTER — Ambulatory Visit (INDEPENDENT_AMBULATORY_CARE_PROVIDER_SITE_OTHER): Payer: BC Managed Care – PPO | Admitting: *Deleted

## 2014-06-01 DIAGNOSIS — Z5181 Encounter for therapeutic drug level monitoring: Secondary | ICD-10-CM

## 2014-06-01 DIAGNOSIS — I4891 Unspecified atrial fibrillation: Secondary | ICD-10-CM

## 2014-06-01 DIAGNOSIS — Z954 Presence of other heart-valve replacement: Secondary | ICD-10-CM

## 2014-06-01 LAB — POCT INR: INR: 2.9

## 2014-07-04 ENCOUNTER — Ambulatory Visit (INDEPENDENT_AMBULATORY_CARE_PROVIDER_SITE_OTHER): Payer: BC Managed Care – PPO | Admitting: *Deleted

## 2014-07-04 DIAGNOSIS — Z5181 Encounter for therapeutic drug level monitoring: Secondary | ICD-10-CM

## 2014-07-04 DIAGNOSIS — Z7901 Long term (current) use of anticoagulants: Secondary | ICD-10-CM

## 2014-07-04 DIAGNOSIS — I4891 Unspecified atrial fibrillation: Secondary | ICD-10-CM

## 2014-07-04 DIAGNOSIS — Z954 Presence of other heart-valve replacement: Secondary | ICD-10-CM

## 2014-07-04 LAB — POCT INR: INR: 4.3

## 2014-07-05 ENCOUNTER — Ambulatory Visit (INDEPENDENT_AMBULATORY_CARE_PROVIDER_SITE_OTHER): Payer: BC Managed Care – PPO | Admitting: Cardiology

## 2014-07-05 ENCOUNTER — Encounter: Payer: Self-pay | Admitting: Cardiology

## 2014-07-05 VITALS — BP 99/64 | HR 80 | Ht 68.0 in | Wt 199.0 lb

## 2014-07-05 DIAGNOSIS — Z954 Presence of other heart-valve replacement: Secondary | ICD-10-CM

## 2014-07-05 DIAGNOSIS — R609 Edema, unspecified: Secondary | ICD-10-CM

## 2014-07-05 DIAGNOSIS — E785 Hyperlipidemia, unspecified: Secondary | ICD-10-CM

## 2014-07-05 DIAGNOSIS — R600 Localized edema: Secondary | ICD-10-CM

## 2014-07-05 DIAGNOSIS — I872 Venous insufficiency (chronic) (peripheral): Secondary | ICD-10-CM

## 2014-07-05 DIAGNOSIS — I4891 Unspecified atrial fibrillation: Secondary | ICD-10-CM

## 2014-07-05 NOTE — Assessment & Plan Note (Signed)
Right worse than left as outlined above. She is on Coumadin, so would doubt DVT. She reports a fall down some slippery steps a few weeks ago, has resolving ecchymoses posteriorly. She will be seeing Dr. Melina Copa next week. If symptoms do not improve, would consider further CT imaging of the leg and perhaps lower abdomen to exclude deep hematoma with venous compression.

## 2014-07-05 NOTE — Assessment & Plan Note (Signed)
Question whether she may be having some side effects from Crestor, complains of muscle achiness. I asked her to try stopping the medicine for a few weeks to see if the symptoms improve. If so, may need to consider another treatment option.

## 2014-07-05 NOTE — Assessment & Plan Note (Signed)
Crisp prosthetic valve sounds on examination. She continues on Coumadin.

## 2014-07-05 NOTE — Assessment & Plan Note (Signed)
ECG today shows sinus rhythm.

## 2014-07-05 NOTE — Progress Notes (Signed)
Clinical Summary [name] is a 63 y.o.female last seen in April. She presents for a routine visit. She reports no chest pain or palpitations, no exertional chest pains. She states she had a fall on some slippery steps in the last few weeks, had a large area of bruising on her right thigh and buttocks, has also has swelling in her right leg more so than left leg. She continues on Coumadin.  Recent lab work at [facility] showed BUN 13, creatinine 1.4, potassium 5.1, TSH 2.7, hemoglobin 10.9, MCV 88.9, platelets 293.  Her weight has gone up nearly 20 pounds over the last year. She states that she has been taking Demadex, we discussed trying to use this in the mornings so that she has less need to go to the bathroom overnight and interrupt her sleep. I also discussed her taking 1-2 pills in the morning if she needed to.  She complains of muscle achiness. Talked about trying a few weeks off Crestor to see if it might be related.  Echocardiogram from January 2014 showed mild LVH with LVEF 76-73%, grade 1 diastolic dysfunction, stable St. Jude aortic prosthesis with mean gradient 8 mm mercury, mild to moderate left atrial enlargement, severely dilated right ventricle, mild tricuspid regurgitation, normal PASP.   Allergies  Allergen Reactions  . Penicillins Hives, Itching, Swelling and Rash    Throat swelling    Current Outpatient Prescriptions  Medication Sig Dispense Refill  . albuterol (PROVENTIL HFA;VENTOLIN HFA) 108 (90 BASE) MCG/ACT inhaler Inhale into the lungs as needed for wheezing or shortness of breath.      . ALPRAZolam (XANAX XR) 1 MG 24 hr tablet Take 1 mg by mouth every morning.       . calcium carbonate (OS-CAL) 600 MG TABS tablet Take 600 mg by mouth 2 (two) times daily.      . cetirizine (ZYRTEC) 10 MG tablet Take 10 mg by mouth daily.        . CRESTOR 10 MG tablet Take 10 mg by mouth daily.      . cyclobenzaprine (FLEXERIL) 10 MG tablet Take 10 mg by mouth as needed.         Marland Kitchen guaiFENesin-codeine (ROBITUSSIN AC) 100-10 MG/5ML syrup Take 10 mLs by mouth as needed.      Marland Kitchen HYDROcodone-acetaminophen (NORCO) 10-325 MG per tablet Take 1 tablet by mouth at bedtime as needed.       . Magnesium Chloride (MAG-DELAY) 535 (64 MG) MG TBCR Take 1 tablet by mouth 2 (two) times daily.      . metoprolol succinate (TOPROL-XL) 25 MG 24 hr tablet Take 25 mg by mouth daily.        . multivitamin (THERAGRAN) per tablet Take 1 tablet by mouth daily.        Marland Kitchen omeprazole (PRILOSEC) 20 MG capsule Take 20 mg by mouth daily.       . ramipril (ALTACE) 2.5 MG capsule Take 2.5 mg by mouth daily.       Marland Kitchen rOPINIRole (REQUIP) 2 MG tablet Take 2 mg by mouth at bedtime.        . temazepam (RESTORIL) 30 MG capsule Take 30 mg by mouth at bedtime as needed.       . torsemide (DEMADEX) 20 MG tablet Take 20 mg by mouth daily.      Marland Kitchen warfarin (COUMADIN) 2 MG tablet Take 2 mg by mouth daily. Takes one tablet (2mg ) everyday besides on Friday when she takes 2 tablets (4mg )  No current facility-administered medications for this visit.    Past Medical History  Diagnosis Date  . Hyperlipidemia   . Coronary atherosclerosis of native coronary artery     Status post CABG 2007  . Atrial fibrillation   . Macromastia   . Anxiety disorder   . GERD (gastroesophageal reflux disease)   . RLS (restless legs syndrome)   . Peptic stricture of esophagus 10/04/2010    GE junction on last EGD by Dr. Gala Romney, benign biopsies  . History of colonoscopy 2003    Dr. Gala Romney - normal  . Schatzki's ring   . Carcinoid tumor of colon 2007  . Aortic stenosis     Status post St. Jude mechanical AVR 2007    Past Surgical History  Procedure Laterality Date  . Appendectomy  2007  . Teeth removal    . Cesarean section    . Abdominal hysterectomy    . Foot surgery      bilateral bunionectomy  . Breast cyst removed      bilateral  . Breast reduction surgery    . Colon surgery  01/2006    Secondary ? Appendiceal  carcinoid  . Laparotomy  2007    small bowel resection secondary to small bowel obstruction  . Aortic valve replacement  2007    #25 mm St. 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 Esophagogastroduodenoscopy  10/03/2010    Dr. Gala Romney- schatzki's ring, shoft peptic stricture at GE junction.  . Coronary artery bypass graft      06/2006 - RIMA to RCA, SVG to RCA  . Back surgery      lumbar 4 and 5   . Maloney dilation  02/05/2012    Procedure: Venia Minks DILATION;  Surgeon: Daneil Dolin, MD;  Location: [facility];  Service: Endoscopy;  Laterality: N/A;  64mm   . Esophageal biopsy  02/05/2012    RMR:Two tongues of salmon-colored epithelium distal esophagus, very short-segment Barrett's s/p bx/Small hiatal hernia, otherwise normal stomach, D1, D2. Status post esophageal dilation. Biopsy showed GERD.  . Colonoscopy  02/05/2012    EXH:BZJIRC rectum, sigmoid diverticulosis,descending colon polyp , tubular adenoma    Social History Ms. Castellana reports that she quit smoking about 25 years ago. Her smoking use included Cigarettes. She has a 20 pack-year smoking history. She has never used smokeless tobacco. Ms. Lovan reports that she does not drink alcohol.  Review of Systems Other systems reviewed and negative.  Physical Examination Filed Vitals:   07/05/14 0902  BP: 99/64  Pulse: 80   Filed Weights   07/05/14 0902  Weight: 199 lb (90.266 kg)    Appears comfortable at rest.  HEENT: Conjunctiva and lids normal, oropharynx clear.  Neck: Supple, no elevated JVP or carotid bruits, no thyromegaly.  Lungs: Clear to auscultation, nonlabored breathing at rest.  Cardiac: Regular rate and rhythm, no S3 with crisp mechanical prosthetic click in S2., no pericardial rub.  Abdomen: Soft, nontender, protuberant, bowel sounds present.  Extremities: 1+ edema on the left, 2-3+ on the right with resolving ecchymoses posteriorly, distal pulses 1-2+. Skin: Some  erythema on the right lower leg. Musculoskeletal: No kyphosis. Neuropsychiatric: Alert and oriented x3, affect appropriate.   Problem List and Plan   Bilateral leg edema Right worse than left as outlined above. She is on Coumadin, so would doubt DVT. She reports a fall down some slippery steps a few weeks ago, has resolving ecchymoses posteriorly. She will be seeing Dr.  Butler next week. If symptoms do not improve, would consider further CT imaging of the leg and perhaps lower abdomen to exclude deep hematoma with venous compression.  Chronic venous insufficiency Known history. She is on Demadex. I encouraged her to take Demadex in the morning, even consider 1-2 tabs as needed depending on weight and edema.  Atrial fibrillation ECG today shows sinus rhythm.  AORTIC VALVE REPLACEMENT, HX OF Crisp prosthetic valve sounds on examination. She continues on Coumadin.  HYPERLIPIDEMIA-MIXED Question whether she may be having some side effects from Crestor, complains of muscle achiness. I asked her to try stopping the medicine for a few weeks to see if the symptoms improve. If so, may need to consider another treatment option.    Satira Sark, M.D., F.A.C.C.

## 2014-07-05 NOTE — Patient Instructions (Signed)

## 2014-07-05 NOTE — Assessment & Plan Note (Signed)
Known history. She is on Demadex. I encouraged her to take Demadex in the morning, even consider 1-2 tabs as needed depending on weight and edema.

## 2014-07-14 ENCOUNTER — Ambulatory Visit (INDEPENDENT_AMBULATORY_CARE_PROVIDER_SITE_OTHER): Payer: BC Managed Care – PPO | Admitting: *Deleted

## 2014-07-14 DIAGNOSIS — Z954 Presence of other heart-valve replacement: Secondary | ICD-10-CM

## 2014-07-14 DIAGNOSIS — Z7901 Long term (current) use of anticoagulants: Secondary | ICD-10-CM

## 2014-07-14 DIAGNOSIS — I4891 Unspecified atrial fibrillation: Secondary | ICD-10-CM

## 2014-07-14 DIAGNOSIS — Z5181 Encounter for therapeutic drug level monitoring: Secondary | ICD-10-CM

## 2014-07-14 LAB — POCT INR: INR: 2.1

## 2014-07-28 ENCOUNTER — Ambulatory Visit (INDEPENDENT_AMBULATORY_CARE_PROVIDER_SITE_OTHER): Payer: BC Managed Care – PPO

## 2014-07-28 DIAGNOSIS — Z5181 Encounter for therapeutic drug level monitoring: Secondary | ICD-10-CM

## 2014-07-28 DIAGNOSIS — Z954 Presence of other heart-valve replacement: Secondary | ICD-10-CM

## 2014-07-28 DIAGNOSIS — Z952 Presence of prosthetic heart valve: Secondary | ICD-10-CM

## 2014-07-28 DIAGNOSIS — Z7901 Long term (current) use of anticoagulants: Secondary | ICD-10-CM

## 2014-07-28 DIAGNOSIS — I4891 Unspecified atrial fibrillation: Secondary | ICD-10-CM

## 2014-07-28 LAB — POCT INR: INR: 2.6

## 2014-08-24 ENCOUNTER — Ambulatory Visit (INDEPENDENT_AMBULATORY_CARE_PROVIDER_SITE_OTHER): Payer: BC Managed Care – PPO | Admitting: *Deleted

## 2014-08-24 DIAGNOSIS — Z954 Presence of other heart-valve replacement: Secondary | ICD-10-CM

## 2014-08-24 DIAGNOSIS — Z952 Presence of prosthetic heart valve: Secondary | ICD-10-CM

## 2014-08-24 DIAGNOSIS — I4891 Unspecified atrial fibrillation: Secondary | ICD-10-CM

## 2014-08-24 DIAGNOSIS — Z5181 Encounter for therapeutic drug level monitoring: Secondary | ICD-10-CM

## 2014-08-24 DIAGNOSIS — Z7901 Long term (current) use of anticoagulants: Secondary | ICD-10-CM

## 2014-08-24 LAB — POCT INR: INR: 2.5

## 2014-09-21 ENCOUNTER — Ambulatory Visit (INDEPENDENT_AMBULATORY_CARE_PROVIDER_SITE_OTHER): Payer: BC Managed Care – PPO | Admitting: *Deleted

## 2014-09-21 ENCOUNTER — Telehealth: Payer: Self-pay | Admitting: Cardiology

## 2014-09-21 DIAGNOSIS — Z7901 Long term (current) use of anticoagulants: Secondary | ICD-10-CM

## 2014-09-21 DIAGNOSIS — Z952 Presence of prosthetic heart valve: Secondary | ICD-10-CM

## 2014-09-21 DIAGNOSIS — Z5181 Encounter for therapeutic drug level monitoring: Secondary | ICD-10-CM

## 2014-09-21 DIAGNOSIS — Z954 Presence of other heart-valve replacement: Secondary | ICD-10-CM

## 2014-09-21 DIAGNOSIS — I4891 Unspecified atrial fibrillation: Secondary | ICD-10-CM

## 2014-09-21 LAB — POCT INR: INR: 2.6

## 2014-09-21 NOTE — Telephone Encounter (Signed)
Evaluation done while patient was here for [facility] visit. On examination there was 2 + bilateral pitting edema in both LE. Also noted swelling in hands and face. Patient said she felt the swelling all over her entire body. Patient c/o some sob. No c/o chest pain or dizziness. Patient said she continues to sleep without having to elevate her head. Confirmed dose of torsemide which is 20 mg daily.

## 2014-09-21 NOTE — Telephone Encounter (Signed)
Please see my last note. At that time I mentioned that she could take up to 40 mg of Demadex daily depending on her weight and leg edema. It sounds like she might need to increase the dose temporarily. Would also follow up with a repeat echocardiogram to ensure stability in systolic and diastolic function as well as AVR.

## 2014-09-21 NOTE — Telephone Encounter (Signed)
patinet walked in for [facility] appt. She asked that you call this afternoon to give her time to get home.  Concerned about possible fluid building up around her heart. Hands, feet and legs are swelling. Michela Pitcher it has been this way for the past couple of months but getting slightly worse She is taking her fluid pills

## 2014-09-25 NOTE — Telephone Encounter (Signed)
Patient notified.  She will increase her Torsemide to 40mg  daily for now.  She agrees to do echo.  Will put order in & forward to [facility]) for scheduling.

## 2014-09-28 ENCOUNTER — Other Ambulatory Visit: Payer: Self-pay

## 2014-09-28 ENCOUNTER — Telehealth: Payer: Self-pay | Admitting: *Deleted

## 2014-09-28 ENCOUNTER — Other Ambulatory Visit (INDEPENDENT_AMBULATORY_CARE_PROVIDER_SITE_OTHER): Payer: BC Managed Care – PPO

## 2014-09-28 DIAGNOSIS — R600 Localized edema: Secondary | ICD-10-CM

## 2014-09-28 DIAGNOSIS — I4891 Unspecified atrial fibrillation: Secondary | ICD-10-CM

## 2014-09-28 DIAGNOSIS — Z952 Presence of prosthetic heart valve: Secondary | ICD-10-CM

## 2014-09-28 DIAGNOSIS — Z954 Presence of other heart-valve replacement: Secondary | ICD-10-CM

## 2014-09-28 NOTE — Telephone Encounter (Signed)
-----   Message from Satira Sark, MD sent at 09/28/2014 11:48 AM EST ----- Reviewed. LV function and aortic valve are stable. Heart rhythm looks to be atrial flutter however, and I wonder whether this has been contributing to some of her symptoms of volume overload and edema. Please have her get an ECG to confirm the rhythm. We may need to consider scheduling a cardioversion.

## 2014-10-02 NOTE — Telephone Encounter (Signed)
Patient informed. 

## 2014-10-03 ENCOUNTER — Ambulatory Visit (INDEPENDENT_AMBULATORY_CARE_PROVIDER_SITE_OTHER): Payer: BC Managed Care – PPO | Admitting: *Deleted

## 2014-10-03 ENCOUNTER — Telehealth: Payer: Self-pay | Admitting: *Deleted

## 2014-10-03 VITALS — BP 106/71 | HR 71 | Ht 68.0 in | Wt 199.0 lb

## 2014-10-03 DIAGNOSIS — R600 Localized edema: Secondary | ICD-10-CM

## 2014-10-03 DIAGNOSIS — I480 Paroxysmal atrial fibrillation: Secondary | ICD-10-CM

## 2014-10-03 NOTE — Progress Notes (Signed)
Patient informed and verbalized understanding of plan. 

## 2014-10-03 NOTE — Progress Notes (Signed)
Patient presents to office today for ekg. Vitals done during visit. Patient continues to having 2+ bilateral pitting edema in lower extremities. Patient has taken all medications as prescribed without side effects. Patient denies chest pain or sob. Patient is concerned about the swelling all over her body.

## 2014-10-03 NOTE — Progress Notes (Signed)
I reviewed the ECG, she is in normal sinus rhythm at this point. Could be that she is having paroxysmal atrial fibrillation/flutter, although no indication for cardioversion at this time. If swelling remains an issue, we need to repeat BMET to reassess renal function on Demadex 40 mg daily. She may need higher dose diuretic at least temporarily.

## 2014-10-03 NOTE — Addendum Note (Signed)
Addended by: Merlene Laughter on: 10/03/2014 11:16 AM   Modules accepted: Orders

## 2014-10-03 NOTE — Telephone Encounter (Signed)
Patient informed and verbalized understanding of plan. Lab order faxed to [facility] lab. 

## 2014-10-03 NOTE — Telephone Encounter (Signed)
See previous phone note.  

## 2014-10-03 NOTE — Telephone Encounter (Signed)
Amy Sark, MD at 10/03/2014 9:06 AM     Status: Sign at close encounter       Expand All Collapse All   I reviewed the ECG, she is in normal sinus rhythm at this point. Could be that she is having paroxysmal atrial fibrillation/flutter, although no indication for cardioversion at this time. If swelling remains an issue, we need to repeat BMET to reassess renal function on Demadex 40 mg daily. She may need higher dose diuretic at least temporarily.

## 2014-10-03 NOTE — Telephone Encounter (Signed)
Patient returned your call. She is now home

## 2014-10-05 ENCOUNTER — Telehealth: Payer: Self-pay | Admitting: *Deleted

## 2014-10-05 NOTE — Telephone Encounter (Signed)
-----   Message from Satira Sark, MD sent at 10/04/2014 10:38 AM EST ----- Reviewed. Creatinine stable at 1.4, potassium 3.4. See if she would consider doubling her Demadex for a week and then reducing the dose back to prior. Keep an eye on weight and edema.

## 2014-10-05 NOTE — Telephone Encounter (Signed)
Patient informed and verbalized understanding of plan. Patient informed nurse that she didn't need extra medication sent to the pharmacy at this time.

## 2014-10-16 ENCOUNTER — Telehealth: Payer: Self-pay | Admitting: Cardiology

## 2014-10-16 DIAGNOSIS — R600 Localized edema: Secondary | ICD-10-CM

## 2014-10-16 DIAGNOSIS — R7989 Other specified abnormal findings of blood chemistry: Secondary | ICD-10-CM

## 2014-10-16 NOTE — Telephone Encounter (Signed)
Patient is also calling wanting to know the test results of her recent BMET.  States that [facility] called her but she forgot.

## 2014-10-16 NOTE — Telephone Encounter (Signed)
Patient calls stating that she needs to have the torsemide (DEMADEX) 20 MG tablet called in. States that she is now Taking 4 tabs per daily per Dr. Domenic Polite.

## 2014-10-18 NOTE — Telephone Encounter (Signed)
Can repeat BMET early next week.

## 2014-10-18 NOTE — Telephone Encounter (Signed)
Discussed below with patient.  Stated that she may have misunderstood Alma Friendly when she give her results of last BMET.  She has been on the increased dose of her Torsemide (80mg  daily) x 2 weeks.  Did not recall her telling her to go back to previous dose (40mg  daily) after 1 week.  Stated that Dr. Melina Copa did refill for her with directions to take Torsemide 20mg  - 2 tabs BID.  States she feels like she is doing better on the higher dose.  Weight is staying about the same and knees, legs, feet, ankles are much better.  Explained to patient that she may need repeat lab to follow up on kidney function, but will send to Dr. Domenic Polite for further advice.

## 2014-10-19 NOTE — Telephone Encounter (Signed)
Patient notified via voice mail.  Request call back on Monday, 10/23/14.

## 2014-10-23 NOTE — Telephone Encounter (Addendum)
Patient notified.  Will fax order to [facility] now & she will go today.  Will forward this note also to Dr. Domenic Polite to make aware & confirm elevated dose of Tosemide at 80mg  daily.

## 2014-10-23 NOTE — Addendum Note (Signed)
Addended by: Laurine Blazer on: 10/23/2014 10:52 AM   Modules accepted: Orders

## 2014-10-24 ENCOUNTER — Telehealth: Payer: Self-pay | Admitting: *Deleted

## 2014-10-24 NOTE — Telephone Encounter (Signed)
-----   Message from Satira Sark, MD sent at 10/23/2014  3:18 PM EST ----- Reviewed. Creatinine 1.6. If she has been doing better on the higher dose Demadex, no change for now.

## 2014-10-24 NOTE — Telephone Encounter (Signed)
Patient informed and said the swelling was better on higher dose.

## 2014-11-02 ENCOUNTER — Ambulatory Visit (INDEPENDENT_AMBULATORY_CARE_PROVIDER_SITE_OTHER): Payer: BLUE CROSS/BLUE SHIELD | Admitting: *Deleted

## 2014-11-02 DIAGNOSIS — Z954 Presence of other heart-valve replacement: Secondary | ICD-10-CM

## 2014-11-02 DIAGNOSIS — I480 Paroxysmal atrial fibrillation: Secondary | ICD-10-CM

## 2014-11-02 DIAGNOSIS — Z952 Presence of prosthetic heart valve: Secondary | ICD-10-CM

## 2014-11-02 DIAGNOSIS — Z5181 Encounter for therapeutic drug level monitoring: Secondary | ICD-10-CM

## 2014-11-02 DIAGNOSIS — I4891 Unspecified atrial fibrillation: Secondary | ICD-10-CM

## 2014-11-02 DIAGNOSIS — Z7901 Long term (current) use of anticoagulants: Secondary | ICD-10-CM

## 2014-11-02 LAB — POCT INR: INR: 2.9

## 2014-12-14 ENCOUNTER — Ambulatory Visit (INDEPENDENT_AMBULATORY_CARE_PROVIDER_SITE_OTHER): Payer: BLUE CROSS/BLUE SHIELD | Admitting: *Deleted

## 2014-12-14 DIAGNOSIS — Z7901 Long term (current) use of anticoagulants: Secondary | ICD-10-CM

## 2014-12-14 DIAGNOSIS — Z952 Presence of prosthetic heart valve: Secondary | ICD-10-CM

## 2014-12-14 DIAGNOSIS — I4891 Unspecified atrial fibrillation: Secondary | ICD-10-CM

## 2014-12-14 DIAGNOSIS — Z954 Presence of other heart-valve replacement: Secondary | ICD-10-CM

## 2014-12-14 DIAGNOSIS — Z5181 Encounter for therapeutic drug level monitoring: Secondary | ICD-10-CM

## 2014-12-14 DIAGNOSIS — I480 Paroxysmal atrial fibrillation: Secondary | ICD-10-CM

## 2014-12-14 LAB — POCT INR: INR: 2.2

## 2014-12-19 ENCOUNTER — Ambulatory Visit: Payer: Medicare Other | Attending: *Deleted | Admitting: Physical Therapy

## 2014-12-19 ENCOUNTER — Encounter: Payer: Self-pay | Admitting: Physical Therapy

## 2014-12-19 DIAGNOSIS — M545 Low back pain, unspecified: Secondary | ICD-10-CM

## 2014-12-19 NOTE — Therapy (Signed)
[facility] [place], [place], [address] Phone: [phone]   Fax:  [phone]  Physical Therapy Evaluation  Patient Details  Name: [name] MRN: [id number] Date of Birth: [birth year] Referring Provider:  Octavio Graves, DO  Encounter Date: 12/19/2014      PT End of Session - 12/19/14 1447    Visit Number 1   Number of Visits 12   PT Start Time 0231   PT Stop Time 0315   PT Time Calculation (min) 44 min   Activity Tolerance Patient tolerated treatment well   Behavior During Therapy [facility] for tasks assessed/performed      Past Medical History  Diagnosis Date  . Hyperlipidemia   . Coronary atherosclerosis of native coronary artery     Status post CABG 2007  . Atrial fibrillation   . Macromastia   . Anxiety disorder   . GERD (gastroesophageal reflux disease)   . RLS (restless legs syndrome)   . Peptic stricture of esophagus 10/04/2010    GE junction on last EGD by Dr. Gala Romney, benign biopsies  . History of colonoscopy 2003    Dr. Gala Romney - normal  . Schatzki's ring   . Carcinoid tumor of colon 2007  . Aortic stenosis     Status post St. Jude mechanical AVR 2007    Past Surgical History  Procedure Laterality Date  . Appendectomy  2007  . Teeth removal    . Cesarean section    . Abdominal hysterectomy    . Foot surgery      bilateral bunionectomy  . Breast cyst removed      bilateral  . Breast reduction surgery    . 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 Laparotomy  2007    small bowel resection secondary to small bowel obstruction  . Aortic valve replacement  2007    #25 mm St. 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 Esophagogastroduodenoscopy  10/03/2010    Dr. Gala Romney- schatzki's ring, shoft peptic stricture at GE junction.  . Coronary artery bypass graft      06/2006 - RIMA to RCA, SVG to RCA  . Back surgery      lumbar 4 and 5    . Maloney dilation  02/05/2012    Procedure: Venia Minks DILATION;  Surgeon: Daneil Dolin, MD;  Location: [facility];  Service: Endoscopy;  Laterality: N/A;  66mm   . Esophageal biopsy  02/05/2012    RMR:Two tongues of salmon-colored epithelium distal esophagus, very short-segment Barrett's s/p bx/Small hiatal hernia, otherwise normal stomach, D1, D2. Status post esophageal dilation. Biopsy showed GERD.  . Colonoscopy  02/05/2012    PPI:RJJOAC rectum, sigmoid diverticulosis,descending colon polyp , tubular adenoma    There were no vitals taken for this visit.  Visit Diagnosis:  Midline low back pain without sciatica - Plan: PT plan of care cert/re-cert      Subjective Assessment - 12/19/14 1437    Symptoms Flare-up after falling downstairs ~ 5 months ago.   Pertinent History Long h/o low back pain.   Patient Stated Goals Decrease pain.   Currently in Pain? Yes   Pain Score 6    Pain Location --  Low back.   Pain Orientation Mid   Pain Descriptors / Indicators Throbbing;Pins and needles   Pain Type Chronic pain   Pain Onset More than a month ago   Pain Frequency Constant   Aggravating  Factors  Walking, standing and siting.   Pain Relieving Factors Lying down.   Effect of Pain on Daily Activities Impaired ability.   Multiple Pain Sites No          [facility] PT Assessment - 12/19/14 0001    Assessment   Medical Diagnosis Lumbar disc disease.   Onset Date --  5 Months.   Precautions   Precautions --  Hearing aids.   Balance Screen   Has the patient fallen in the past 6 months Yes   Has the patient had a decrease in activity level because of a fear of falling?  No   Is the patient reluctant to leave their home because of a fear of falling?  No   Prior Function   Level of Independence Independent with basic ADLs   Posture/Postural Control   Posture/Postural Control Postural limitations   Postural Limitations Anterior pelvic tilt   ROM / Strength   AROM / PROM / Strength  AROM;Strength   AROM   Overall AROM Comments 5 degs of active spinal extension and flexion to 70 degrees   Strength   Overall Strength Within functional limits for tasks performed   Overall Strength Comments Normal bilateral E strength.   Palpation   Palpation --  L4-L5 and bilateral SIJ's.   Special Tests    Special Tests Lumbar;Sacrolliac Tests  Bilateral Pat reflexes are normal and bilateral Ach=0+/4+   Sacroiliac Tests  --  Neg FABER; Neg SLR: equal leg lengths.                  [facility] Adult PT Treatment/Exercise - 12/19/14 0001    Modalities   Modalities Electrical Stimulation;Moist Heat   Moist Heat Therapy   Number Minutes Moist Heat 20 Minutes   Moist Heat Location Other (comment)  Low back   Electrical Stimulation   Electrical Stimulation Location --  Low back   Electrical Stimulation Parameters 80-150Hz  at 100% scan x 20 minutes   Electrical Stimulation Goals Pain                     PT Long Term Goals - 12/19/14 1505    PT LONG TERM GOAL #1   Title Ind with advanced HEP.   Time 4   Period Weeks   Status New   PT LONG TERM GOAL #2   Title Perform ADL's with pain not > 3-4/10.   PT LONG TERM GOAL #3   Title Walk a community distance wiht pain not > 3-4/10   Time 4   Period Weeks   Status New   PT LONG TERM GOAL #4   Title Stand 20 minutes with pain not > 3-4/10   Time 4   Period Weeks   Status New   PT LONG TERM GOAL #5   Title Sit 20 minutes with pain not > 3-4/10               Plan - 12/19/14 1459    Clinical Impression Statement The patient has  along h/o low back pain but reports after falling down seven steps this aggravated her low back greatly.  She reports central low back pain radiating out bilaterally from her lower back region.  Her resting pain-level isa 6/10 today but can go to higher levels with prolonged sitting, standing and ADL performance.  She reports manipulations from her D.O. are helpful.   Pt will  benefit from skilled therapeutic intervention in order to improve on  the following deficits Pain;Decreased activity tolerance   Rehab Potential Good   PT Frequency 2x / week  or 12 visits.   PT Duration 6 weeks   PT Treatment/Interventions Electrical Stimulation;Ultrasound;Moist Heat;Therapeutic activities;Patient/family education;Therapeutic exercise;Manual techniques   PT Next Visit Plan Modalties and STW/M.  Begin low back exercies (ie:  Draw-ins and hip bridges.   Consulted and Agree with Plan of Care Patient          G-Codes - Dec 29, 2014 1506    Functional Assessment Tool Used Clinical judgement.   Functional Limitation Mobility: Walking and moving around   Mobility: Walking and Moving Around Current Status [phone]) At least 40 percent but less than 60 percent impaired, limited or restricted   Mobility: Walking and Moving Around Goal Status [phone]) At least 1 percent but less than 20 percent impaired, limited or restricted       Problem List Patient Active Problem List   Diagnosis Date Noted  . Bilateral leg edema 07/05/2014  . Encounter for therapeutic drug monitoring 11/15/2013  . Chronic venous insufficiency 08/26/2011  . Encounter for long-term (current) use of anticoagulants 01/11/2011  . GERD 09/16/2010  . Aortic valve replaced 06/05/2009  . HYPERLIPIDEMIA-MIXED 06/04/2009  . Coronary atherosclerosis of native coronary artery 06/04/2009  . Atrial fibrillation 06/04/2009    Kahron Kauth, [place]  MPT  2014-12-29, 3:17 PM  [facility] [address] [place], [place], [address] Phone: [phone]   Fax:  [phone]

## 2014-12-27 ENCOUNTER — Encounter: Payer: Self-pay | Admitting: *Deleted

## 2014-12-27 ENCOUNTER — Ambulatory Visit: Payer: Medicare Other | Admitting: *Deleted

## 2014-12-27 DIAGNOSIS — M545 Low back pain, unspecified: Secondary | ICD-10-CM

## 2014-12-27 NOTE — Therapy (Signed)
[facility] [place], [place], [address] Phone: [phone]   Fax:  [phone]  Physical Therapy Treatment  Patient Details  Name: [name] MRN: [id number] Date of Birth: Mar 20, 1951 Referring Provider:  Octavio Graves, DO  Encounter Date: 12/27/2014      PT End of Session - 12/27/14 1207    Visit Number 1   Number of Visits 12   PT Start Time 1031   PT Stop Time 1128   PT Time Calculation (min) 57 min   Activity Tolerance Patient tolerated treatment well   Behavior During Therapy [facility] for tasks assessed/performed      Past Medical History  Diagnosis Date  . Hyperlipidemia   . Coronary atherosclerosis of native coronary artery     Status post CABG 2007  . Atrial fibrillation   . Macromastia   . Anxiety disorder   . GERD (gastroesophageal reflux disease)   . RLS (restless legs syndrome)   . Peptic stricture of esophagus 10/04/2010    GE junction on last EGD by Dr. Gala Romney, benign biopsies  . History of colonoscopy 2003    Dr. Gala Romney - normal  . Schatzki's ring   . Carcinoid tumor of colon 2007  . Aortic stenosis     Status post St. Jude mechanical AVR 2007    Past Surgical History  Procedure Laterality Date  . Appendectomy  2007  . Teeth removal    . Cesarean section    . Abdominal hysterectomy    . Foot surgery      bilateral bunionectomy  . Breast cyst removed      bilateral  . Breast reduction surgery    . 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 Laparotomy  2007    small bowel resection secondary to small bowel obstruction  . Aortic valve replacement  2007    #25 mm St. 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 Esophagogastroduodenoscopy  10/03/2010    Dr. Gala Romney- schatzki's ring, shoft peptic stricture at GE junction.  . Coronary artery bypass graft      06/2006 - RIMA to RCA, SVG to RCA  . Back surgery      lumbar 4 and 5    . Maloney dilation  02/05/2012    Procedure: Venia Minks DILATION;  Surgeon: Daneil Dolin, MD;  Location: [facility];  Service: Endoscopy;  Laterality: N/A;  4mm   . Esophageal biopsy  02/05/2012    RMR:Two tongues of salmon-colored epithelium distal esophagus, very short-segment Barrett's s/p bx/Small hiatal hernia, otherwise normal stomach, D1, D2. Status post esophageal dilation. Biopsy showed GERD.  . Colonoscopy  02/05/2012    [facility]:WCBJSE rectum, sigmoid diverticulosis,descending colon polyp , tubular adenoma    There were no vitals taken for this visit.  Visit Diagnosis:  Midline low back pain without sciatica      Subjective Assessment - 12/27/14 1146    Symptoms 6/10 low back pain which is made worse by house chores   Limitations Sitting;Lifting;Standing;Walking;House hold activities   Currently in Pain? Yes   Pain Score 6    Pain Location Back   Pain Orientation Lower   Pain Descriptors / Indicators Aching;Constant;Tender   Pain Type Chronic pain   Pain Onset More than a month ago   Pain Frequency Constant   Aggravating Factors  walking, standing, sitting   Pain Relieving Factors rest   Effect of Pain on Daily Activities  imparied ability   Multiple Pain Sites No                    [facility] Adult PT Treatment/Exercise - 12/27/14 0001    Modalities   Modalities Ultrasound  ultrasound 1.5w/cm sq to lumbar 10 min   Moist Heat Therapy   Number Minutes Moist Heat 20 Minutes   Moist Heat Location --  lumbar   Electrical Stimulation   Electrical Stimulation Location lumbar   Electrical Stimulation Parameters 80-150hz    Ultrasound   Ultrasound Location lumbar   Ultrasound Parameters 10 min...1.5w/cm   Manual Therapy   Manual Therapy --  manual therapy to include stw, TPR to lumbar                     PT Long Term Goals - 12/19/14 1505    PT LONG TERM GOAL #1   Title Ind with advanced HEP.   Time 4   Period Weeks   Status New   PT LONG TERM GOAL  #2   Title Perform ADL's with pain not > 3-4/10.   PT LONG TERM GOAL #3   Title Walk a community distance wiht pain not > 3-4/10   Time 4   Period Weeks   Status New   PT LONG TERM GOAL #4   Title Stand 20 minutes with pain not > 3-4/10   Time 4   Period Weeks   Status New   PT LONG TERM GOAL #5   Title Sit 20 minutes with pain not > 3-4/10               Plan - 12/27/14 1209    Clinical Impression Statement Patient states treatment helped back but certain house chores increase pain. Pain 6/10   Pt will benefit from skilled therapeutic intervention in order to improve on the following deficits Impaired flexibility;Decreased activity tolerance;Pain   Rehab Potential Good   PT Frequency 2x / week   PT Duration 6 weeks   PT Treatment/Interventions Electrical Stimulation;Ultrasound;Manual techniques;Therapeutic exercise;Moist Heat   PT Next Visit Plan begin low back exercise.....(hip bridge and draw in)   Consulted and Agree with Plan of Care Patient        Problem List Patient Active Problem List   Diagnosis Date Noted  . Bilateral leg edema 07/05/2014  . Encounter for therapeutic drug monitoring 11/15/2013  . Chronic venous insufficiency 08/26/2011  . Encounter for long-term (current) use of anticoagulants 01/11/2011  . GERD 09/16/2010  . Aortic valve replaced 06/05/2009  . HYPERLIPIDEMIA-MIXED 06/04/2009  . Coronary atherosclerosis of native coronary artery 06/04/2009  . Atrial fibrillation 06/04/2009    Fabienne Bruns P,PTA 12/27/2014, 12:17 PM  [facility] [address] [place], [place], [address] Phone: [phone]   Fax:  [phone]

## 2015-01-01 ENCOUNTER — Encounter: Payer: Self-pay | Admitting: Cardiology

## 2015-01-01 ENCOUNTER — Ambulatory Visit (INDEPENDENT_AMBULATORY_CARE_PROVIDER_SITE_OTHER): Payer: BLUE CROSS/BLUE SHIELD | Admitting: Cardiology

## 2015-01-01 VITALS — BP 111/74 | HR 64 | Ht 68.0 in | Wt 199.1 lb

## 2015-01-01 DIAGNOSIS — I2510 Atherosclerotic heart disease of native coronary artery without angina pectoris: Secondary | ICD-10-CM

## 2015-01-01 DIAGNOSIS — I480 Paroxysmal atrial fibrillation: Secondary | ICD-10-CM

## 2015-01-01 DIAGNOSIS — E782 Mixed hyperlipidemia: Secondary | ICD-10-CM

## 2015-01-01 DIAGNOSIS — Z952 Presence of prosthetic heart valve: Secondary | ICD-10-CM

## 2015-01-01 NOTE — Patient Instructions (Signed)

## 2015-01-01 NOTE — Progress Notes (Signed)
Cardiology Office Note  Date: 01/01/2015   ID: [name], DOB [birth year], MRN [id number]  PCP: Octavio Graves, DO  Primary Cardiologist: Rozann Lesches, MD   Chief Complaint  Patient presents with  . Coronary Artery Disease  . Atrial Fibrillation  . Status post AVR    History of Present Illness: [name] is a 64 y.o. female last seen in September 2015. She was noted to have evidence of paroxysmal atrial fibrillation/flutter since that time. Demadex has been advanced as well for better control of edema. She states that she has had no further swelling, her weight has been stable. She has not been able to exercise due to chronic back pain with recent flareup. We did talk about trying to increase her aerobic activity when able.  Follow-up echocardiogram from December 2015 is reviewed below. LVEF remains normal range at that point and her aortic prosthesis was functioning appropriately.  Most recent INR was 2.2. She continues on Coumadin. Reports no palpitations.  She has had no angina symptoms or required nitroglycerin.   Past Medical History  Diagnosis Date  . Hyperlipidemia   . Coronary atherosclerosis of native coronary artery     Status post CABG 2007  . Atrial fibrillation   . Macromastia   . Anxiety disorder   . GERD (gastroesophageal reflux disease)   . RLS (restless legs syndrome)   . Peptic stricture of esophagus 10/04/2010    GE junction on last EGD by Dr. Gala Romney, benign biopsies  . History of colonoscopy 2003    Dr. Gala Romney - normal  . Schatzki's ring   . Carcinoid tumor of colon 2007  . Aortic stenosis     Status post St. Jude mechanical AVR 2007    Past Surgical History  Procedure Laterality Date  . Appendectomy  2007  . Teeth removal    . Cesarean section    . Abdominal hysterectomy    . Foot surgery      bilateral bunionectomy  . Breast cyst removed      bilateral  . Breast reduction surgery    . 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 Laparotomy  2007    small bowel resection secondary to small bowel obstruction  . Aortic valve replacement  2007    #25 mm St. 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 Esophagogastroduodenoscopy  10/03/2010    Dr. Gala Romney- schatzki's ring, shoft peptic stricture at GE junction.  . Coronary artery bypass graft      06/2006 - RIMA to RCA, SVG to RCA  . Back surgery      lumbar 4 and 5   . Maloney dilation  02/05/2012    Procedure: Venia Minks DILATION;  Surgeon: Daneil Dolin, MD;  Location: [facility];  Service: Endoscopy;  Laterality: N/A;  58mm   . Esophageal biopsy  02/05/2012    RMR:Two tongues of salmon-colored epithelium distal esophagus, very short-segment Barrett's s/p bx/Small hiatal hernia, otherwise normal stomach, D1, D2. Status post esophageal dilation. Biopsy showed GERD.  . Colonoscopy  02/05/2012    HYW:VPXTGG rectum, sigmoid diverticulosis,descending colon polyp , tubular adenoma    Current Outpatient Prescriptions  Medication Sig Dispense Refill  . albuterol (PROVENTIL HFA;VENTOLIN HFA) 108 (90 BASE) MCG/ACT inhaler Inhale into the lungs as needed for wheezing or shortness of breath.    . ALPRAZolam (XANAX XR) 1 MG 24 hr tablet Take 1 mg by mouth every  morning.     . calcium carbonate (OS-CAL) 600 MG TABS tablet Take 600 mg by mouth 2 (two) times daily.    . cetirizine (ZYRTEC) 10 MG tablet Take 10 mg by mouth daily.      . CRESTOR 10 MG tablet Take 10 mg by mouth daily.    . cyclobenzaprine (FLEXERIL) 10 MG tablet Take 10 mg by mouth as needed.     Marland Kitchen guaiFENesin-codeine (ROBITUSSIN AC) 100-10 MG/5ML syrup Take 10 mLs by mouth as needed.    Marland Kitchen HYDROcodone-acetaminophen (NORCO) 10-325 MG per tablet Take 1 tablet by mouth at bedtime as needed.     . Magnesium Chloride (MAG-DELAY) 535 (64 MG) MG TBCR Take 1 tablet by mouth 2 (two) times daily.    . metoprolol succinate (TOPROL-XL) 25 MG 24 hr tablet Take 25 mg by  mouth daily.      . multivitamin (THERAGRAN) per tablet Take 1 tablet by mouth daily.      Marland Kitchen omeprazole (PRILOSEC) 20 MG capsule Take 20 mg by mouth daily.     . ramipril (ALTACE) 2.5 MG capsule Take 2.5 mg by mouth daily.     Marland Kitchen rOPINIRole (REQUIP) 2 MG tablet Take 1 mg by mouth at bedtime.     . temazepam (RESTORIL) 30 MG capsule Take 30 mg by mouth at bedtime as needed.     . torsemide (DEMADEX) 20 MG tablet Take 80 mg by mouth daily.     Marland Kitchen warfarin (COUMADIN) 2 MG tablet Take 2 mg by mouth daily. Takes one tablet (2mg ) everyday besides on Friday when she takes 2 tablets (4mg )     No current facility-administered medications for this visit.    Allergies:  Penicillins   Social History: The patient  reports that she quit smoking about 26 years ago. Her smoking use included Cigarettes. She has a 20 pack-year smoking history. She has never used smokeless tobacco. She reports that she does not drink alcohol or use illicit drugs.    ROS:  Please see the history of present illness. Otherwise, complete review of systems is positive for none.  All other systems are reviewed and negative.    Physical Exam: VS:  BP 111/74 mmHg  Pulse 64  Ht 5\' 8"  (1.727 m)  Wt 199 lb 1.9 oz (90.32 kg)  BMI 30.28 kg/m2  SpO2 97%, BMI Body mass index is 30.28 kg/(m^2).  Wt Readings from Last 3 Encounters:  01/01/15 199 lb 1.9 oz (90.32 kg)  10/03/14 199 lb (90.266 kg)  07/05/14 199 lb (90.266 kg)     Appears comfortable at rest.  HEENT: Conjunctiva and lids normal, oropharynx clear.  Neck: Supple, no elevated JVP or carotid bruits, no thyromegaly.  Lungs: Clear to auscultation, nonlabored breathing at rest.  Cardiac: Regular rate and rhythm, no S3 with crisp mechanical prosthetic click in S2., no pericardial rub.  Abdomen: Soft, nontender, protuberant, bowel sounds present.  Extremities: 1+ edema on the left, 2-3+ on the right with resolving ecchymoses posteriorly, distal pulses 1-2+. Skin: Some  erythema on the right lower leg. Musculoskeletal: No kyphosis. Neuropsychiatric: Alert and oriented x3, affect appropriate.   ECG: Tracing from December 2015 showed normal sinus rhythm with nonspecific ST-T changes.   Other Studies Reviewed Today:  Echocardiogram 09/28/2014: Study Conclusions  - Left ventricle: The cavity size was normal. Wall thickness was increased in a pattern of mild LVH. Systolic function was normal. The estimated ejection fraction was in the range of 60% to 65%. Wall motion  was normal; there were no regional wall motion abnormalities. The study is not technically sufficient to allow evaluation of LV diastolic function. Rhythm during study appears to be atrial flutter with 2:1 and variable conduction. - Aortic valve: A St. Jude [employer] mechanical prosthesis was present. There was no significant regurgitation. Mean gradient (S): 6 mm Hg. VTI ratio of LVOT to aortic valve: 0.58. - Mitral valve: Moderately calcified annulus. There was trivial regurgitation. - Left atrium: The atrium was mildly dilated. - Tricuspid valve: There was trivial regurgitation. - Pulmonary arteries: Systolic pressure could not be accurately estimated. - Pericardium, extracardiac: A prominent pericardial fat pad was present. There was no pericardial effusion.  Impressions:  - Mild LVH with LVEF 60-65%. Indeterminate diastolic function - rhythm during study appears to be atrial flutter with 2:1 and variable conduction. Mild left atrial enlargement. Moderate MAC with trivial mitral regurgitation. Stable St. Jude aortic prosthesis with normal gradients and no aortic regurgitation. Unable to assess PASP.  Assessment and Plan:  1. CAD status post CABG in 2007. She reports no active angina symptoms. LVEF remains normal range.  2. Paroxysmal atrial fibrillation/flutter, currently asymptomatic in terms of palpitations. She is on Coumadin and  Toprol-XL.  3. Aortic stenosis status post St. Jude mechanical AVR in 2007, stable by recent follow-up echocardiogram.  4. Hyperlipidemia, she continues on Crestor.  Current medicines are reviewed at length with the patient today.  The patient does not have concerns regarding medicines.   Disposition: FU with me in 6 months.   Signed, Satira Sark, MD, [facility] 01/01/2015 11:05 AM    [facility] [place], [place], [place] [address] Phone: [phone]; Fax: [phone]

## 2015-01-04 ENCOUNTER — Encounter: Payer: Self-pay | Admitting: *Deleted

## 2015-01-04 ENCOUNTER — Ambulatory Visit: Payer: Medicare Other | Admitting: *Deleted

## 2015-01-04 DIAGNOSIS — M545 Low back pain, unspecified: Secondary | ICD-10-CM

## 2015-01-04 NOTE — Therapy (Signed)
[facility] [place], [place], [address] Phone: [phone]   Fax:  [phone]  Physical Therapy Treatment  Patient Details  Name: [name] MRN: [id number] Date of Birth: 03/28/1951 Referring Provider:  Octavio Graves, DO  Encounter Date: 01/04/2015      PT End of Session - 01/04/15 1115    Visit Number 3   Number of Visits 12   PT Start Time 5465   PT Stop Time 1122   PT Time Calculation (min) 51 min      Past Medical History  Diagnosis Date  . Hyperlipidemia   . Coronary atherosclerosis of native coronary artery     Status post CABG 2007  . Atrial fibrillation   . Macromastia   . Anxiety disorder   . GERD (gastroesophageal reflux disease)   . RLS (restless legs syndrome)   . Peptic stricture of esophagus 10/04/2010    GE junction on last EGD by Dr. Gala Romney, benign biopsies  . History of colonoscopy 2003    Dr. Gala Romney - normal  . Schatzki's ring   . Carcinoid tumor of colon 2007  . Aortic stenosis     Status post St. Jude mechanical AVR 2007    Past Surgical History  Procedure Laterality Date  . Appendectomy  2007  . Teeth removal    . Cesarean section    . Abdominal hysterectomy    . Foot surgery      bilateral bunionectomy  . Breast cyst removed      bilateral  . Breast reduction surgery    . 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 Laparotomy  2007    small bowel resection secondary to small bowel obstruction  . Aortic valve replacement  2007    #25 mm St. 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 Esophagogastroduodenoscopy  10/03/2010    Dr. Gala Romney- schatzki's ring, shoft peptic stricture at GE junction.  . Coronary artery bypass graft      06/2006 - RIMA to RCA, SVG to RCA  . Back surgery      lumbar 4 and 5   . Maloney dilation  02/05/2012    Procedure: Venia Minks DILATION;  Surgeon: Daneil Dolin, MD;  Location: [facility];   Service: Endoscopy;  Laterality: N/A;  63mm   . Esophageal biopsy  02/05/2012    RMR:Two tongues of salmon-colored epithelium distal esophagus, very short-segment Barrett's s/p bx/Small hiatal hernia, otherwise normal stomach, D1, D2. Status post esophageal dilation. Biopsy showed GERD.  . Colonoscopy  02/05/2012    KPT:WSFKCL rectum, sigmoid diverticulosis,descending colon polyp , tubular adenoma    There were no vitals filed for this visit.  Visit Diagnosis:  Midline low back pain without sciatica      Subjective Assessment - 01/04/15 1036    Symptoms 6/10 low back pain which is made worse by house chores   Pertinent History Long h/o low back pain.   Limitations Sitting;Lifting;Standing;Walking;House hold activities   Patient Stated Goals Decrease pain.   Currently in Pain? Yes   Pain Score 6    Pain Location Back   Pain Orientation Lower   Pain Descriptors / Indicators Aching;Constant   Pain Type Chronic pain   Pain Onset More than a month ago   Pain Frequency Constant   Aggravating Factors  walking,standing,sitting   Pain Relieving Factors rest  [facility] Adult PT Treatment/Exercise - 01/04/15 0001    Modalities   Modalities Ultrasound   Moist Heat Therapy   Number Minutes Moist Heat 15 Minutes   Moist Heat Location --  LB paras   Electrical Stimulation   Electrical Stimulation Location lumbar   Electrical Stimulation Parameters 80-120hz    Electrical Stimulation Goals Pain   Ultrasound   Ultrasound Location LB paras in RT sidelying   Ultrasound Parameters 64min,1.5 w/cm2,25mhz   Ultrasound Goals Pain   Manual Therapy   Manual Therapy Massage   Massage moderate pressure to LB paras into thoracic paras in RT sidelying                     PT Long Term Goals - 12/19/14 1505    PT LONG TERM GOAL #1   Title Ind with advanced HEP.   Time 4   Period Weeks   Status New   PT LONG TERM GOAL #2   Title Perform ADL's with pain  not > 3-4/10.   PT LONG TERM GOAL #3   Title Walk a community distance wiht pain not > 3-4/10   Time 4   Period Weeks   Status New   PT LONG TERM GOAL #4   Title Stand 20 minutes with pain not > 3-4/10   Time 4   Period Weeks   Status New   PT LONG TERM GOAL #5   Title Sit 20 minutes with pain not > 3-4/10               Plan - 01/04/15 1116    Clinical Impression Statement Pt did well, but still very sore   Pt will benefit from skilled therapeutic intervention in order to improve on the following deficits Impaired flexibility;Decreased activity tolerance;Pain   Rehab Potential Good   PT Frequency 2x / week   PT Duration 6 weeks   PT Treatment/Interventions Electrical Stimulation;Ultrasound;Manual techniques;Therapeutic exercise;Moist Heat   PT Next Visit Plan begin low back exercise...Marland KitchenMarland KitchenDrawins        Problem List Patient Active Problem List   Diagnosis Date Noted  . Bilateral leg edema 07/05/2014  . Encounter for therapeutic drug monitoring 11/15/2013  . Chronic venous insufficiency 08/26/2011  . Encounter for long-term (current) use of anticoagulants 01/11/2011  . GERD 09/16/2010  . Aortic valve replaced 06/05/2009  . HYPERLIPIDEMIA-MIXED 06/04/2009  . Coronary atherosclerosis of native coronary artery 06/04/2009  . Atrial fibrillation 06/04/2009    RAMSEUR,CHRIS,PTA 01/04/2015, 11:40 AM  [facility] [address] [place], [place], [address] Phone: [phone]   Fax:  [phone]

## 2015-01-09 ENCOUNTER — Encounter: Payer: Self-pay | Admitting: Physical Therapy

## 2015-01-09 ENCOUNTER — Ambulatory Visit: Payer: Medicare Other | Admitting: Physical Therapy

## 2015-01-09 DIAGNOSIS — M545 Low back pain, unspecified: Secondary | ICD-10-CM

## 2015-01-09 NOTE — Patient Instructions (Signed)
Pelvic Tilt: Posterior - Legs Bent (Supine)   Tighten stomach and flatten back by rolling pelvis down. Hold _10___ seconds. Relax. Repeat _10-30___ times per set. Do __2__ sets per session. Do _2___ sessions per day.  http://orth.exer.us/202   Copyright  VHI. All rights reserved.  Bridging   Slowly raise buttocks from floor, keeping stomach tight. Repeat _10___ times per set. Do __2__ sets per session. Do __2__ sessions per day.  http://orth.exer.us/1096   Copyright  VHI. All rights reserved.  Straight Leg Raise   Tighten stomach and slowly raise locked right leg __4__ inches from floor. Repeat __10-30__ times per set. Do __2__ sets per session. Do __2__ sessions per day.  http://orth.exer.us/1102   Copyright  VHI. All rights reserved.  Scapular Retraction: Bilateral

## 2015-01-09 NOTE — Therapy (Signed)
[facility] [place], [place], [address] Phone: [phone]   Fax:  [phone]  Physical Therapy Treatment  Patient Details  Name: [name] MRN: [id number] Date of Birth: 1951-01-22 Referring Provider:  Octavio Graves, DO  Encounter Date: 01/09/2015      PT End of Session - 01/09/15 1150    Visit Number 4   Number of Visits 12   PT Start Time 1116   PT Stop Time 1200   PT Time Calculation (min) 44 min   Activity Tolerance Patient tolerated treatment well   Behavior During Therapy [facility] for tasks assessed/performed      Past Medical History  Diagnosis Date  . Hyperlipidemia   . Coronary atherosclerosis of native coronary artery     Status post CABG 2007  . Atrial fibrillation   . Macromastia   . Anxiety disorder   . GERD (gastroesophageal reflux disease)   . RLS (restless legs syndrome)   . Peptic stricture of esophagus 10/04/2010    GE junction on last EGD by Dr. Gala Romney, benign biopsies  . History of colonoscopy 2003    Dr. Gala Romney - normal  . Schatzki's ring   . Carcinoid tumor of colon 2007  . Aortic stenosis     Status post St. Jude mechanical AVR 2007    Past Surgical History  Procedure Laterality Date  . Appendectomy  2007  . Teeth removal    . Cesarean section    . Abdominal hysterectomy    . Foot surgery      bilateral bunionectomy  . Breast cyst removed      bilateral  . Breast reduction surgery    . 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 Laparotomy  2007    small bowel resection secondary to small bowel obstruction  . Aortic valve replacement  2007    #25 mm St. 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 Esophagogastroduodenoscopy  10/03/2010    Dr. Gala Romney- schatzki's ring, shoft peptic stricture at GE junction.  . Coronary artery bypass graft      06/2006 - RIMA to RCA, SVG to RCA  . Back surgery      lumbar 4 and 5    . Maloney dilation  02/05/2012    Procedure: Venia Minks DILATION;  Surgeon: Daneil Dolin, MD;  Location: [facility];  Service: Endoscopy;  Laterality: N/A;  82mm   . Esophageal biopsy  02/05/2012    RMR:Two tongues of salmon-colored epithelium distal esophagus, very short-segment Barrett's s/p bx/Small hiatal hernia, otherwise normal stomach, D1, D2. Status post esophageal dilation. Biopsy showed GERD.  . Colonoscopy  02/05/2012    IWL:NLGXQJ rectum, sigmoid diverticulosis,descending colon polyp , tubular adenoma    There were no vitals filed for this visit.  Visit Diagnosis:  Midline low back pain without sciatica      Subjective Assessment - 01/09/15 1123    Symptoms felt better after last tx   Pertinent History Long h/o low back pain.   Limitations Sitting;Lifting;Standing;Walking;House hold activities   Currently in Pain? Yes   Pain Score 5    Pain Location Back   Pain Orientation Lower   Pain Descriptors / Indicators Aching;Sore   Pain Type Chronic pain   Pain Onset More than a month ago   Aggravating Factors  weight bearing activities   Pain Relieving Factors rest  [place] Adult PT Treatment/Exercise - 01/09/15 0001    Moist Heat Therapy   Number Minutes Moist Heat 10 Minutes   Moist Heat Location --  low back   Electrical Stimulation   Electrical Stimulation Location lumbar   Electrical Stimulation Parameters 80-120hz  x 10 min   Electrical Stimulation Goals Pain   Ultrasound   Ultrasound Location --  low back paraspinals   Ultrasound Parameters 1.5w/cm2/100%/71mhz x 2min   Manual Therapy   Manual Therapy Massage   Massage moderate pressure to LB paras into thoracic paras in RT sidelying                PT Education - 01/09/15 1149    Education provided Yes   Education Details posture techniques-verbal and draw in HEP   Person(s) Educated Patient   Methods Explanation;Demonstration;Handout   Comprehension Verbalized  understanding;Returned demonstration             PT Long Term Goals - 12/19/14 1505    PT LONG TERM GOAL #1   Title Ind with advanced HEP.   Time 4   Period Weeks   Status New   PT LONG TERM GOAL #2   Title Perform ADL's with pain not > 3-4/10.   PT LONG TERM GOAL #3   Title Walk a community distance wiht pain not > 3-4/10   Time 4   Period Weeks   Status New   PT LONG TERM GOAL #4   Title Stand 20 minutes with pain not > 3-4/10   Time 4   Period Weeks   Status New   PT LONG TERM GOAL #5   Title Sit 20 minutes with pain not > 3-4/10               Plan - 01/09/15 1153    Clinical Impression Statement pt tolerated tx very well and understands importance of posture techniques to prevent injury. pt has reported less pain thus far. goals ongoing   Pt will benefit from skilled therapeutic intervention in order to improve on the following deficits Impaired flexibility;Decreased activity tolerance;Pain   Rehab Potential Good   PT Frequency 2x / week   PT Duration 6 weeks   PT Treatment/Interventions Electrical Stimulation;Ultrasound;Manual techniques;Therapeutic exercise;Moist Heat   PT Next Visit Plan cont with POC/begin low back exercise...Marland KitchenMarland KitchenDrawins   Consulted and Agree with Plan of Care Patient        Problem List Patient Active Problem List   Diagnosis Date Noted  . Bilateral leg edema 07/05/2014  . Encounter for therapeutic drug monitoring 11/15/2013  . Chronic venous insufficiency 08/26/2011  . Encounter for long-term (current) use of anticoagulants 01/11/2011  . GERD 09/16/2010  . Aortic valve replaced 06/05/2009  . HYPERLIPIDEMIA-MIXED 06/04/2009  . Coronary atherosclerosis of native coronary artery 06/04/2009  . Atrial fibrillation 06/04/2009    Nur Rabold P, PTA 01/09/2015, 12:08 PM  [facility] [address] [place], [place], [address] Phone: [phone]   Fax:  [phone]

## 2015-01-11 ENCOUNTER — Ambulatory Visit: Payer: Medicare Other | Admitting: Physical Therapy

## 2015-01-11 DIAGNOSIS — M545 Low back pain, unspecified: Secondary | ICD-10-CM

## 2015-01-11 NOTE — Therapy (Signed)
[facility] [place], [place], [address] Phone: [phone]   Fax:  [phone]  Physical Therapy Treatment  Patient Details  Name: NANSI BIRMINGHAM MRN: [id number] Date of Birth: [birth year] Referring Provider:  Octavio Graves, DO  Encounter Date: 01/11/2015      PT End of Session - 01/11/15 1258    Visit Number 5   Number of Visits 12   PT Start Time 0100   PT Stop Time 0152   PT Time Calculation (min) 52 min      Past Medical History  Diagnosis Date  . Hyperlipidemia   . Coronary atherosclerosis of native coronary artery     Status post CABG 2007  . Atrial fibrillation   . Macromastia   . Anxiety disorder   . GERD (gastroesophageal reflux disease)   . RLS (restless legs syndrome)   . Peptic stricture of esophagus 10/04/2010    GE junction on last EGD by Dr. Gala Romney, benign biopsies  . History of colonoscopy 2003    Dr. Gala Romney - normal  . Schatzki's ring   . Carcinoid tumor of colon 2007  . Aortic stenosis     Status post St. Jude mechanical AVR 2007    Past Surgical History  Procedure Laterality Date  . Appendectomy  2007  . Teeth removal    . Cesarean section    . Abdominal hysterectomy    . Foot surgery      bilateral bunionectomy  . Breast cyst removed      bilateral  . Breast reduction surgery    . 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 Laparotomy  2007    small bowel resection secondary to small bowel obstruction  . Aortic valve replacement  2007    #25 mm St. 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 Esophagogastroduodenoscopy  10/03/2010    Dr. Gala Romney- schatzki's ring, shoft peptic stricture at GE junction.  . Coronary artery bypass graft      06/2006 - RIMA to RCA, SVG to RCA  . Back surgery      lumbar 4 and 5   . Maloney dilation  02/05/2012    Procedure: Venia Minks DILATION;  Surgeon: Daneil Dolin, MD;  Location: [facility];   Service: Endoscopy;  Laterality: N/A;  4mm   . Esophageal biopsy  02/05/2012    RMR:Two tongues of salmon-colored epithelium distal esophagus, very short-segment Barrett's s/p bx/Small hiatal hernia, otherwise normal stomach, D1, D2. Status post esophageal dilation. Biopsy showed GERD.  . Colonoscopy  02/05/2012    FMB:[id number] rectum, sigmoid diverticulosis,descending colon polyp , tubular adenoma    There were no vitals filed for this visit.  Visit Diagnosis:  Midline low back pain without sciatica      Subjective Assessment - 01/11/15 1258    Symptoms Shampooing floors.   Pertinent History Long h/o low back pain.   Limitations Sitting;Lifting;Standing;Walking;House hold activities   Pain Score 5    Pain Location Back   Pain Orientation Lower   Pain Type Chronic pain   Pain Onset More than a month ago   Pain Frequency Constant                       [facility] Adult PT Treatment/Exercise - 01/11/15 1311    Electrical Stimulation   Electrical Stimulation Location lumbar   Electrical Stimulation Parameters 80-150 H z x 15 minutes  Manual---stw/m to bil lumbar paraspinals x 39minutes                PT Long Term Goals - 01/11/15 1321    PT LONG TERM GOAL #1   Title Ind with advanced HEP.   Time 4   Period Weeks   Status New   PT LONG TERM GOAL #2   Title Perform ADL's with pain not > 3-4/10.   Time 4   Period Weeks   Status New   PT LONG TERM GOAL #3   Title Walk a community distance wiht pain not > 3-4/10   Time 4   Period Weeks   Status New   PT LONG TERM GOAL #4   Title Stand 20 minutes with pain not > 3-4/10   Time 4   Period Weeks   Status New               Problem List Patient Active Problem List   Diagnosis Date Noted  . Bilateral leg edema 07/05/2014  . Encounter for therapeutic drug monitoring 11/15/2013  . Chronic venous insufficiency 08/26/2011  . Encounter for long-term (current) use of anticoagulants 01/11/2011   . GERD 09/16/2010  . Aortic valve replaced 06/05/2009  . HYPERLIPIDEMIA-MIXED 06/04/2009  . Coronary atherosclerosis of native coronary artery 06/04/2009  . Atrial fibrillation 06/04/2009    Shanekqua Schaper, [place] MPT 01/11/2015, 1:52 PM  [facility] [address] [place], [place], [address] Phone: [phone]   Fax:  [phone]

## 2015-01-17 ENCOUNTER — Encounter: Payer: Medicare Other | Admitting: *Deleted

## 2015-01-19 ENCOUNTER — Ambulatory Visit: Payer: Medicare Other | Attending: *Deleted | Admitting: *Deleted

## 2015-01-19 ENCOUNTER — Encounter: Payer: Self-pay | Admitting: *Deleted

## 2015-01-19 DIAGNOSIS — M545 Low back pain, unspecified: Secondary | ICD-10-CM

## 2015-01-19 NOTE — Therapy (Signed)
[facility] [place], [place], [address] Phone: [phone]   Fax:  [phone]  Physical Therapy Treatment  Patient Details  Name: [name] MRN: [id number] Date of Birth: [birth year] Referring Provider:  Octavio Graves, DO  Encounter Date: 01/19/2015      PT End of Session - 01/19/15 1101    Visit Number 6   Number of Visits 12   PT Start Time 0814   PT Stop Time 4818   PT Time Calculation (min) 45 min      Past Medical History  Diagnosis Date  . Hyperlipidemia   . Coronary atherosclerosis of native coronary artery     Status post CABG 2007  . Atrial fibrillation   . Macromastia   . Anxiety disorder   . GERD (gastroesophageal reflux disease)   . RLS (restless legs syndrome)   . Peptic stricture of esophagus 10/04/2010    GE junction on last EGD by Dr. Gala Romney, benign biopsies  . History of colonoscopy 2003    Dr. Gala Romney - normal  . Schatzki's ring   . Carcinoid tumor of colon 2007  . Aortic stenosis     Status post St. Jude mechanical AVR 2007    Past Surgical History  Procedure Laterality Date  . Appendectomy  2007  . Teeth removal    . Cesarean section    . Abdominal hysterectomy    . Foot surgery      bilateral bunionectomy  . Breast cyst removed      bilateral  . Breast reduction surgery    . 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 Laparotomy  2007    small bowel resection secondary to small bowel obstruction  . Aortic valve replacement  2007    #25 mm St. 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 Esophagogastroduodenoscopy  10/03/2010    Dr. Gala Romney- schatzki's ring, shoft peptic stricture at GE junction.  . Coronary artery bypass graft      06/2006 - RIMA to RCA, SVG to RCA  . Back surgery      lumbar 4 and 5   . Maloney dilation  02/05/2012    Procedure: Venia Minks DILATION;  Surgeon: Daneil Dolin, MD;  Location: [facility];   Service: Endoscopy;  Laterality: N/A;  41mm   . Esophageal biopsy  02/05/2012    RMR:Two tongues of salmon-colored epithelium distal esophagus, very short-segment Barrett's s/p bx/Small hiatal hernia, otherwise normal stomach, D1, D2. Status post esophageal dilation. Biopsy showed GERD.  . Colonoscopy  02/05/2012    HUD:JSHFWY rectum, sigmoid diverticulosis,descending colon polyp , tubular adenoma    There were no vitals filed for this visit.  Visit Diagnosis:  Midline low back pain without sciatica      Subjective Assessment - 01/19/15 1038    Symptoms Shampooing floors increases pain. Doing better   Pertinent History Long h/o low back pain.   Limitations Sitting;Lifting;Standing;Walking;House hold activities   Patient Stated Goals Decrease pain.   Currently in Pain? Yes   Pain Score 3    Pain Location Back   Pain Orientation Lower   Pain Descriptors / Indicators Aching;Sore   Pain Type Chronic pain                       [facility] Adult PT Treatment/Exercise - 01/19/15 0001    Electrical Stimulation   Electrical Stimulation Location lumbar  Electrical Stimulation Action premod   Electrical Stimulation Parameters 80-150   Ultrasound   Ultrasound Location LB paras   Ultrasound Parameters 1.5 w/cm2, 21mhz, 100%,,x 10 mins   Ultrasound Goals Pain   Manual Therapy   Manual Therapy Myofascial release   Massage moderate pressure to LB paras into thoracic paras in RT sidelying   Myofascial Release STW/M x 15 minutes to bil lumbar parapinals                     PT Long Term Goals - 01/19/15 1128    PT LONG TERM GOAL #1   Title Ind with advanced HEP.   Time 4   Period Weeks   Status On-going   PT LONG TERM GOAL #2   Title Perform ADL's with pain not > 3-4/10.   Time 4   Period Weeks   Status On-going   PT LONG TERM GOAL #3   Title Walk a community distance wiht pain not > 3-4/10   Time 4   Period Weeks   Status On-going   PT LONG TERM GOAL #4    Title Stand 20 minutes with pain not > 3-4/10   Time 4   Period Weeks   Status On-going               Plan - 01/19/15 1103    Clinical Impression Statement Pt did great with Rx today and feels she is moving better. She is wearing a new brace as well. Goals are ongoing   Pt will benefit from skilled therapeutic intervention in order to improve on the following deficits Impaired flexibility;Decreased activity tolerance;Pain   Rehab Potential Good   PT Frequency 2x / week   PT Duration 6 weeks   PT Treatment/Interventions Electrical Stimulation;Ultrasound;Manual techniques;Therapeutic exercise;Moist Heat   PT Next Visit Plan cont with POC/begin low back exercise...Marland KitchenMarland KitchenDrawins and Freight forwarder and Agree with Plan of Care Patient        Problem List Patient Active Problem List   Diagnosis Date Noted  . Bilateral leg edema 07/05/2014  . Encounter for therapeutic drug monitoring 11/15/2013  . Chronic venous insufficiency 08/26/2011  . Encounter for long-term (current) use of anticoagulants 01/11/2011  . GERD 09/16/2010  . Aortic valve replaced 06/05/2009  . HYPERLIPIDEMIA-MIXED 06/04/2009  . Coronary atherosclerosis of native coronary artery 06/04/2009  . Atrial fibrillation 06/04/2009    Gwendoline Judy,CHRIS, PTA 01/19/2015, 11:29 AM  [facility] [address] [place], [place], [address] Phone: [phone]   Fax:  [phone]

## 2015-01-23 ENCOUNTER — Encounter: Payer: Self-pay | Admitting: *Deleted

## 2015-01-23 ENCOUNTER — Ambulatory Visit: Payer: Medicare Other | Admitting: *Deleted

## 2015-01-23 DIAGNOSIS — M545 Low back pain, unspecified: Secondary | ICD-10-CM

## 2015-01-23 NOTE — Therapy (Signed)
[facility] [place], [place], [address] Phone: [phone]   Fax:  [phone]  Physical Therapy Treatment  Patient Details  Name: [name] MRN: [id number] Date of Birth: October 02, 1951 Referring Provider:  Octavio Graves, DO  Encounter Date: 01/23/2015      PT End of Session - 01/23/15 1433    Visit Number 7   Number of Visits 12   PT Start Time 1430   PT Stop Time 1524   PT Time Calculation (min) 54 min      Past Medical History  Diagnosis Date  . Hyperlipidemia   . Coronary atherosclerosis of native coronary artery     Status post CABG 2007  . Atrial fibrillation   . Macromastia   . Anxiety disorder   . GERD (gastroesophageal reflux disease)   . RLS (restless legs syndrome)   . Peptic stricture of esophagus 10/04/2010    GE junction on last EGD by Dr. Gala Romney, benign biopsies  . History of colonoscopy 2003    Dr. Gala Romney - normal  . Schatzki's ring   . Carcinoid tumor of colon 2007  . Aortic stenosis     Status post St. Jude mechanical AVR 2007    Past Surgical History  Procedure Laterality Date  . Appendectomy  2007  . Teeth removal    . Cesarean section    . Abdominal hysterectomy    . Foot surgery      bilateral bunionectomy  . Breast cyst removed      bilateral  . Breast reduction surgery    . 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 Laparotomy  2007    small bowel resection secondary to small bowel obstruction  . Aortic valve replacement  2007    #25 mm St. 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 Esophagogastroduodenoscopy  10/03/2010    Dr. Gala Romney- schatzki's ring, shoft peptic stricture at GE junction.  . Coronary artery bypass graft      06/2006 - RIMA to RCA, SVG to RCA  . Back surgery      lumbar 4 and 5   . Maloney dilation  02/05/2012    Procedure: Venia Minks DILATION;  Surgeon: Daneil Dolin, MD;  Location: [facility];   Service: Endoscopy;  Laterality: N/A;  47mm   . Esophageal biopsy  02/05/2012    RMR:Two tongues of salmon-colored epithelium distal esophagus, very short-segment Barrett's s/p bx/Small hiatal hernia, otherwise normal stomach, D1, D2. Status post esophageal dilation. Biopsy showed GERD.  . Colonoscopy  02/05/2012    LYY:TKPTWS rectum, sigmoid diverticulosis,descending colon polyp , tubular adenoma    There were no vitals filed for this visit.  Visit Diagnosis:  Midline low back pain without sciatica      Subjective Assessment - 01/23/15 1431    Subjective Did ok after last Rx. Reached in the fridge this morning and had increased pain. To MD tomorrow   Pertinent History Long h/o low back pain.   Limitations Sitting;Lifting;Standing;Walking;House hold activities   Patient Stated Goals Decrease pain.   Currently in Pain? Yes   Pain Score 3    Pain Location Back   Pain Orientation Lower   Pain Descriptors / Indicators Aching;Sore   Pain Type Chronic pain   Pain Onset More than a month ago   Pain Frequency Constant   Aggravating Factors  ADLs   Pain Relieving Factors rest  [facility] Adult PT Treatment/Exercise - 01/23/15 0001    Moist Heat Therapy   Number Minutes Moist Heat 15 Minutes   Moist Heat Location --  LB   Electrical Stimulation   Electrical Stimulation Location lumbar   Electrical Stimulation Action IFC   Electrical Stimulation Parameters 80-150   Manual Therapy   Manual Therapy Myofascial release   Massage moderate pressure to LB paras into thoracic paras in RT sidelying   Myofascial Release STW/M x 23 minutes to bil lumbar parapinals                     PT Long Term Goals - 01/23/15 1518    PT LONG TERM GOAL #1   Title Ind with advanced HEP.   Time 4   Period Weeks   Status On-going   PT LONG TERM GOAL #2   Title Perform ADL's with pain not > 3-4/10.   Period Weeks   Status On-going   PT LONG TERM GOAL #3    Title Walk a community distance wiht pain not > 3-4/10   Period Weeks   Status Achieved   PT LONG TERM GOAL #4   Title Stand 20 minutes with pain not > 3-4/10   Period Weeks   Status On-going   PT LONG TERM GOAL #5   Title Sit 20 minutes with pain not > 3-4/10   Status On-going               Plan - 01/23/15 1510    Clinical Impression Statement Pt. feels that she is doing about 20- 30% better overall. ADL's are getting easier   Pt will benefit from skilled therapeutic intervention in order to improve on the following deficits Impaired flexibility;Decreased activity tolerance;Pain   Rehab Potential Good   PT Frequency 2x / week   PT Duration 6 weeks   PT Treatment/Interventions Electrical Stimulation;Ultrasound;Manual techniques;Therapeutic exercise;Moist Heat   PT Next Visit Plan cont with POC/begin low back exercise...Marland KitchenMarland KitchenDrawins and marching        Problem List Patient Active Problem List   Diagnosis Date Noted  . Bilateral leg edema 07/05/2014  . Encounter for therapeutic drug monitoring 11/15/2013  . Chronic venous insufficiency 08/26/2011  . Encounter for long-term (current) use of anticoagulants 01/11/2011  . GERD 09/16/2010  . Aortic valve replaced 06/05/2009  . HYPERLIPIDEMIA-MIXED 06/04/2009  . Coronary atherosclerosis of native coronary artery 06/04/2009  . Atrial fibrillation 06/04/2009  Levonne Lapping, PTA 01/23/2015 3:25 PM  Zaedyn Covin,CHRIS,PTA 01/23/2015, 3:25 PM  [facility] [address] [place], [place], [address] Phone: [phone]   Fax:  [phone]

## 2015-01-25 ENCOUNTER — Ambulatory Visit (INDEPENDENT_AMBULATORY_CARE_PROVIDER_SITE_OTHER): Payer: Medicare Other | Admitting: *Deleted

## 2015-01-25 DIAGNOSIS — Z7901 Long term (current) use of anticoagulants: Secondary | ICD-10-CM

## 2015-01-25 DIAGNOSIS — I4891 Unspecified atrial fibrillation: Secondary | ICD-10-CM

## 2015-01-25 DIAGNOSIS — Z954 Presence of other heart-valve replacement: Secondary | ICD-10-CM | POA: Diagnosis not present

## 2015-01-25 DIAGNOSIS — Z5181 Encounter for therapeutic drug level monitoring: Secondary | ICD-10-CM

## 2015-01-25 DIAGNOSIS — I480 Paroxysmal atrial fibrillation: Secondary | ICD-10-CM

## 2015-01-25 DIAGNOSIS — Z952 Presence of prosthetic heart valve: Secondary | ICD-10-CM

## 2015-01-25 LAB — POCT INR: INR: 2.3

## 2015-01-30 ENCOUNTER — Encounter: Payer: Self-pay | Admitting: Physical Therapy

## 2015-01-30 ENCOUNTER — Ambulatory Visit: Payer: Medicare Other | Admitting: Physical Therapy

## 2015-01-30 DIAGNOSIS — M545 Low back pain, unspecified: Secondary | ICD-10-CM

## 2015-01-30 NOTE — Therapy (Addendum)
[facility] [place], [place], [address] Phone: [phone]   Fax:  [phone]  Physical Therapy Treatment  Patient Details  Name: [name] MRN: [id number] Date of Birth: 1951/10/14 Referring Provider:  Octavio Graves, DO  Encounter Date: 01/30/2015      PT End of Session - 01/30/15 1127    PT Start Time 1115   Equipment Utilized During Treatment --  Wore back brace to appointment   Activity Tolerance Patient tolerated treatment well   Behavior During Therapy [facility] for tasks assessed/performed      Past Medical History  Diagnosis Date  . Hyperlipidemia   . Coronary atherosclerosis of native coronary artery     Status post CABG 2007  . Atrial fibrillation   . Macromastia   . Anxiety disorder   . GERD (gastroesophageal reflux disease)   . RLS (restless legs syndrome)   . Peptic stricture of esophagus 10/04/2010    GE junction on last EGD by Dr. Gala Romney, benign biopsies  . History of colonoscopy 2003    Dr. Gala Romney - normal  . Schatzki's ring   . Carcinoid tumor of colon 2007  . Aortic stenosis     Status post St. Jude mechanical AVR 2007    Past Surgical History  Procedure Laterality Date  . Appendectomy  2007  . Teeth removal    . Cesarean section    . Abdominal hysterectomy    . Foot surgery      bilateral bunionectomy  . Breast cyst removed      bilateral  . Breast reduction surgery    . 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 Laparotomy  2007    small bowel resection secondary to small bowel obstruction  . Aortic valve replacement  2007    #25 mm St. 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 Esophagogastroduodenoscopy  10/03/2010    Dr. Gala Romney- schatzki's ring, shoft peptic stricture at GE junction.  . Coronary artery bypass graft      06/2006 - RIMA to RCA, SVG to RCA  . Back surgery      lumbar 4 and 5   . Maloney dilation   02/05/2012    Procedure: Venia Minks DILATION;  Surgeon: Daneil Dolin, MD;  Location: [facility];  Service: Endoscopy;  Laterality: N/A;  43m   . Esophageal biopsy  02/05/2012    RMR:Two tongues of salmon-colored epithelium distal esophagus, very short-segment Barrett's s/p bx/Small hiatal hernia, otherwise normal stomach, D1, D2. Status post esophageal dilation. Biopsy showed GERD.  . Colonoscopy  02/05/2012    RXFG:HWEXHBrectum, sigmoid diverticulosis,descending colon polyp , tubular adenoma    There were no vitals filed for this visit.  Visit Diagnosis:  Midline low back pain without sciatica      Subjective Assessment - 01/30/15 1124    Subjective States that she did okay after last treatment. Wears brace at home if she is doing cleaning around the house or during activities.    Pertinent History Long h/o low back pain.   Limitations Sitting;Lifting;Standing;Walking;House hold activities   Patient Stated Goals Decrease pain.   Currently in Pain? Yes   Pain Score 2    Pain Location Back   Pain Orientation Lower;Left;Right   Pain Descriptors / Indicators Sharp   Pain Type Chronic pain   Pain Onset More than a month ago   Pain Frequency Intermittent  [facility] PT Assessment - 01/30/15 0001    Assessment   Medical Diagnosis Lumbar disc disease.                   [facility] Adult PT Treatment/Exercise - 01/30/15 0001    Exercises   Exercises Lumbar   Lumbar Exercises: Stretches   Single Knee to Chest Stretch 3 reps;30 seconds  Bilaterally   Lumbar Exercises: Aerobic   Stationary Bike NuStep x8 min with drawin and posture awareness   Lumbar Exercises: Supine   Bridge 20 reps;2 seconds   Straight Leg Raise 20 reps;2 seconds  Bilateral LEs   Other Supine Lumbar Exercises Marching x 20 reps   Manual Therapy   Manual Therapy Myofascial release   Myofascial Release STW/TPR x 15 min to bilateral paraspinals with moderate-max pressure to decrease tightness and pain                 PT Education - 01/30/15 1142    Education provided Yes   Education Details HEP- SKTC stretch, bridging, SLR, marching with drawins   Person(s) Educated Patient   Methods Explanation;Demonstration;Verbal cues;Handout   Comprehension Verbalized understanding;Returned demonstration;Verbal cues required             PT Long Term Goals - 01/30/15 1144    PT LONG TERM GOAL #1   Title (p) Ind with advanced HEP.   Time (p) 4   Period (p) Weeks   Status (p) On-going   PT LONG TERM GOAL #2   Title (p) Perform ADL's with pain not > 3-4/10.   Time (p) 4   Period (p) Weeks   Status (p) Achieved   PT LONG TERM GOAL #3   Title (p) Walk a community distance wiht pain not > 3-4/10   Time (p) 4   Period (p) Weeks   Status (p) Achieved   PT LONG TERM GOAL #4   Title (p) Stand 20 minutes with pain not > 3-4/10   Time (p) 4   Period (p) Weeks   Status (p) Achieved   PT LONG TERM GOAL #5   Title (p) Sit 20 minutes with pain not > 3-4/10   Status (p) Achieved               Plan - 01/30/15 1210    Clinical Impression Statement Patient tolerated treatment well today. Was apprehensive about beginning NuStep with drawin and posture awareness. Tolerated new HEP exercises well with no complaint of increased pain. Tightness noted along bilateral paraspinals during manual therapy. Experienced decreased tightness and 2/10 pain following treatment.   Pt will benefit from skilled therapeutic intervention in order to improve on the following deficits Impaired flexibility;Decreased activity tolerance;Pain   Rehab Potential Good   PT Frequency 2x / week   PT Duration 6 weeks   PT Treatment/Interventions Electrical Stimulation;Ultrasound;Manual techniques;Therapeutic exercise;Moist Heat   PT Next Visit Plan Continue PT POC with low back exercises and manual therapy as symptoms dictate.   Consulted and Agree with Plan of Care Patient        Problem List Patient Active  Problem List   Diagnosis Date Noted  . Bilateral leg edema 07/05/2014  . Encounter for therapeutic drug monitoring 11/15/2013  . Chronic venous insufficiency 08/26/2011  . Encounter for long-term (current) use of anticoagulants 01/11/2011  . GERD 09/16/2010  . Aortic valve replaced 06/05/2009  . HYPERLIPIDEMIA-MIXED 06/04/2009  . Coronary atherosclerosis of native coronary artery 06/04/2009  . Atrial fibrillation 06/04/2009  PHYSICAL THERAPY DISCHARGE SUMMARY  Visits from Start of Care:   Current functional level related to goals / functional outcomes: Please see above.   Remaining deficits: Goals met.   Education / Equipment: HEP. Plan: Patient agrees to discharge.  Patient goals were met. Patient is being discharged due to meeting the stated rehab goals.  ?????      Mali Applegate MPT [facility] [place], [place], [address] Phone: [phone]   Fax:  [phone]

## 2015-01-30 NOTE — Patient Instructions (Signed)
Bridging   Slowly raise buttocks from floor, keeping stomach tight. Repeat __10__ times per set. Do __3__ sets per session. Do __2__ sessions per day.  http://orth.exer.us/1096   Copyright  VHI. All rights reserved.  Straight Leg Raise: With External Leg Rotation   Lie on back with right leg straight, opposite leg bent. Lift _4___ inches. Repeat __10__ times per set. Do __3__ sets per session. Do _2___ sessions per day.  http://orth.exer.us/728   Copyright  VHI. All rights reserved.  Bent Leg Lift (Hook-Lying)   Tighten stomach and slowly raise right leg __2__ inches from floor. Keep trunk rigid.  Repeat _10___ times per set. Do _3___ sets per session. Do _2___ sessions per day.  http://orth.exer.us/1090   Copyright  VHI. All rights reserved.  Knee-to-Chest Stretch: Unilateral   With hand behind right knee, pull knee in to chest until a comfortable stretch is felt in lower back and buttocks. Keep back relaxed. Hold __30__ seconds. Repeat __3__ times per set. Do _1___ sets per session. Do __2__ sessions per day.  http://orth.exer.us/126   Copyright  VHI. All rights reserved.

## 2015-02-06 ENCOUNTER — Encounter: Payer: Medicare Other | Admitting: Physical Therapy

## 2015-03-08 ENCOUNTER — Ambulatory Visit: Payer: Medicare Other | Admitting: *Deleted

## 2015-03-08 ENCOUNTER — Ambulatory Visit (INDEPENDENT_AMBULATORY_CARE_PROVIDER_SITE_OTHER): Payer: Medicare Other | Admitting: *Deleted

## 2015-03-08 VITALS — BP 110/70 | HR 80 | Wt 202.0 lb

## 2015-03-08 DIAGNOSIS — Z5181 Encounter for therapeutic drug level monitoring: Secondary | ICD-10-CM

## 2015-03-08 DIAGNOSIS — Z7901 Long term (current) use of anticoagulants: Secondary | ICD-10-CM

## 2015-03-08 DIAGNOSIS — R0602 Shortness of breath: Secondary | ICD-10-CM

## 2015-03-08 DIAGNOSIS — Z954 Presence of other heart-valve replacement: Secondary | ICD-10-CM | POA: Diagnosis not present

## 2015-03-08 DIAGNOSIS — I4891 Unspecified atrial fibrillation: Secondary | ICD-10-CM | POA: Diagnosis not present

## 2015-03-08 DIAGNOSIS — Z952 Presence of prosthetic heart valve: Secondary | ICD-10-CM

## 2015-03-08 LAB — POCT INR: INR: 3.6

## 2015-03-08 NOTE — Progress Notes (Signed)
Pt c/o SOB. Denies dizziness, chest pain. Pt has spoken with Dr. Melina Copa regarding SOB, Melina Copa told pt that she would call in inhaler and cough syrup, and that when she came in for INR check to have EKG. Pt daughter passed away this morning and this is making her anxious. EKG was reviewed by DOD and normal. SOB seems to be related to bronchitis which pcp is managing, pt agreeable. Will forward to provider

## 2015-03-09 NOTE — Patient Instructions (Signed)
To be determined.

## 2015-03-29 ENCOUNTER — Ambulatory Visit (INDEPENDENT_AMBULATORY_CARE_PROVIDER_SITE_OTHER): Payer: Medicare Other | Admitting: *Deleted

## 2015-03-29 DIAGNOSIS — I4891 Unspecified atrial fibrillation: Secondary | ICD-10-CM

## 2015-03-29 DIAGNOSIS — Z5181 Encounter for therapeutic drug level monitoring: Secondary | ICD-10-CM | POA: Diagnosis not present

## 2015-03-29 DIAGNOSIS — Z7901 Long term (current) use of anticoagulants: Secondary | ICD-10-CM

## 2015-03-29 DIAGNOSIS — Z952 Presence of prosthetic heart valve: Secondary | ICD-10-CM

## 2015-03-29 DIAGNOSIS — Z954 Presence of other heart-valve replacement: Secondary | ICD-10-CM

## 2015-03-29 LAB — POCT INR: INR: 2.4

## 2015-04-24 LAB — PROTIME-INR: INR: 3 — AB (ref 0.9–1.1)

## 2015-04-25 ENCOUNTER — Ambulatory Visit (INDEPENDENT_AMBULATORY_CARE_PROVIDER_SITE_OTHER): Payer: Medicare Other | Admitting: *Deleted

## 2015-04-25 DIAGNOSIS — Z5181 Encounter for therapeutic drug level monitoring: Secondary | ICD-10-CM

## 2015-04-25 DIAGNOSIS — I4891 Unspecified atrial fibrillation: Secondary | ICD-10-CM

## 2015-04-25 DIAGNOSIS — Z954 Presence of other heart-valve replacement: Secondary | ICD-10-CM

## 2015-04-25 DIAGNOSIS — Z952 Presence of prosthetic heart valve: Secondary | ICD-10-CM

## 2015-05-25 LAB — POCT INR: INR: 2.38

## 2015-05-28 ENCOUNTER — Ambulatory Visit (INDEPENDENT_AMBULATORY_CARE_PROVIDER_SITE_OTHER): Payer: Medicare Other | Admitting: *Deleted

## 2015-05-28 ENCOUNTER — Telehealth: Payer: Self-pay | Admitting: *Deleted

## 2015-05-28 DIAGNOSIS — Z954 Presence of other heart-valve replacement: Secondary | ICD-10-CM

## 2015-05-28 DIAGNOSIS — Z5181 Encounter for therapeutic drug level monitoring: Secondary | ICD-10-CM

## 2015-05-28 DIAGNOSIS — Z952 Presence of prosthetic heart valve: Secondary | ICD-10-CM

## 2015-05-28 DIAGNOSIS — I4891 Unspecified atrial fibrillation: Secondary | ICD-10-CM

## 2015-05-28 NOTE — Telephone Encounter (Signed)
Pt would like to know if you received her lab work pls call pt at [phone]

## 2015-05-29 ENCOUNTER — Telehealth: Payer: Self-pay | Admitting: *Deleted

## 2015-05-29 NOTE — Telephone Encounter (Signed)
Have tried to call pt at this # yesterday and today with no answer.  Phone is not set up for voice mail.  Have also tried other phone numbers listed.  Pt is till in [place] visiting.

## 2015-05-29 NOTE — Telephone Encounter (Signed)
Gave pt INR results and coumadin instructions.  Appt made for 06/26/15 when she gets back from [place].

## 2015-06-26 ENCOUNTER — Ambulatory Visit (INDEPENDENT_AMBULATORY_CARE_PROVIDER_SITE_OTHER): Payer: Medicare Other | Admitting: *Deleted

## 2015-06-26 DIAGNOSIS — Z954 Presence of other heart-valve replacement: Secondary | ICD-10-CM

## 2015-06-26 DIAGNOSIS — Z5181 Encounter for therapeutic drug level monitoring: Secondary | ICD-10-CM | POA: Diagnosis not present

## 2015-06-26 DIAGNOSIS — Z952 Presence of prosthetic heart valve: Secondary | ICD-10-CM

## 2015-06-26 DIAGNOSIS — Z7901 Long term (current) use of anticoagulants: Secondary | ICD-10-CM | POA: Diagnosis not present

## 2015-06-26 DIAGNOSIS — I4891 Unspecified atrial fibrillation: Secondary | ICD-10-CM

## 2015-06-26 LAB — POCT INR: INR: 2.2

## 2015-07-10 ENCOUNTER — Ambulatory Visit (INDEPENDENT_AMBULATORY_CARE_PROVIDER_SITE_OTHER): Payer: Medicare Other | Admitting: Cardiology

## 2015-07-10 ENCOUNTER — Encounter: Payer: Self-pay | Admitting: Cardiology

## 2015-07-10 ENCOUNTER — Encounter: Payer: Self-pay | Admitting: *Deleted

## 2015-07-10 VITALS — BP 108/72 | HR 73 | Ht 68.0 in | Wt 198.8 lb

## 2015-07-10 DIAGNOSIS — Z954 Presence of other heart-valve replacement: Secondary | ICD-10-CM

## 2015-07-10 DIAGNOSIS — R600 Localized edema: Secondary | ICD-10-CM

## 2015-07-10 DIAGNOSIS — Z952 Presence of prosthetic heart valve: Secondary | ICD-10-CM

## 2015-07-10 DIAGNOSIS — I480 Paroxysmal atrial fibrillation: Secondary | ICD-10-CM

## 2015-07-10 DIAGNOSIS — I2510 Atherosclerotic heart disease of native coronary artery without angina pectoris: Secondary | ICD-10-CM

## 2015-07-10 MED ORDER — TORSEMIDE 20 MG PO TABS: 50.0000 mg | ORAL_TABLET | Freq: Two times a day (BID) | ORAL | Status: DC

## 2015-07-10 NOTE — Patient Instructions (Signed)
Your physician recommends that you continue on your current medications as directed. Please refer to the Current Medication list given to you today. Your physician recommends that you schedule a follow-up appointment in: 6 months. You will receive a reminder letter in the mail in about 4 months reminding you to call and schedule your appointment. If you don't receive this letter, please contact our office. 

## 2015-07-10 NOTE — Progress Notes (Signed)
Cardiology Office Note  Date: 07/10/2015   ID: TANDI HANKO, DOB 1951/08/03, MRN [id number]  PCP: Octavio Graves, DO  Primary Cardiologist: Rozann Lesches, MD   Chief Complaint  Patient presents with  . Coronary Artery Disease  . Valvular heart disease  . Atrial Fibrillation    History of Present Illness: [name] is a 65 y.o. female last seen in March. She presents for a routine visit. Since last encounter she denies any angina symptoms or increasing shortness of breath/palpitations. We reviewed her medications, she is now taking Demadex at 50 mg twice daily. Reports stable control of her leg edema, and her weight looks stable as well, down a few pounds.  She just had recent lab work with Dr. Melina Copa, we are requesting the results for review.  Echocardiogram from last year is reviewed below. She has had stable function of her mechanical aortic prosthesis. She continues on Coumadin, reports no bleeding problems with recent therapeutic INR.   Past Medical History  Diagnosis Date  . Hyperlipidemia   . Coronary atherosclerosis of native coronary artery     Status post CABG 2007  . Atrial fibrillation   . Macromastia   . Anxiety disorder   . GERD (gastroesophageal reflux disease)   . RLS (restless legs syndrome)   . Peptic stricture of esophagus 10/04/2010    GE junction on last EGD by Dr. Gala Romney, benign biopsies  . History of colonoscopy 2003    Dr. Gala Romney - normal  . Schatzki's ring   . Carcinoid tumor of colon 2007  . Aortic stenosis     Status post St. Jude mechanical AVR 2007    Current Outpatient Prescriptions  Medication Sig Dispense Refill  . albuterol (PROVENTIL HFA;VENTOLIN HFA) 108 (90 BASE) MCG/ACT inhaler Inhale into the lungs as needed for wheezing or shortness of breath.    . ALPRAZolam (XANAX XR) 1 MG 24 hr tablet Take 1 mg by mouth every morning.     . calcium carbonate (OS-CAL) 600 MG TABS tablet Take 600 mg by mouth 2 (two) times daily.    .  cetirizine (ZYRTEC) 10 MG tablet Take 10 mg by mouth daily.      . CRESTOR 10 MG tablet Take 10 mg by mouth daily.    . cyclobenzaprine (FLEXERIL) 10 MG tablet Take 10 mg by mouth as needed.     Marland Kitchen guaiFENesin-codeine (ROBITUSSIN AC) 100-10 MG/5ML syrup Take 10 mLs by mouth as needed.    Marland Kitchen HYDROcodone-acetaminophen (NORCO) 10-325 MG per tablet Take 1 tablet by mouth at bedtime as needed.     Marland Kitchen levothyroxine (SYNTHROID, LEVOTHROID) 50 MCG tablet Take 50 mcg by mouth daily.    . Magnesium Chloride (MAG-DELAY) 535 (64 MG) MG TBCR Take 1 tablet by mouth 2 (two) times daily.    . metoprolol succinate (TOPROL-XL) 25 MG 24 hr tablet Take 25 mg by mouth daily.      . multivitamin (THERAGRAN) per tablet Take 1 tablet by mouth daily.      Marland Kitchen omeprazole (PRILOSEC) 20 MG capsule Take 20 mg by mouth daily.     . ramipril (ALTACE) 2.5 MG capsule Take 2.5 mg by mouth daily.     Marland Kitchen rOPINIRole (REQUIP) 2 MG tablet Take 1 mg by mouth at bedtime.     . temazepam (RESTORIL) 30 MG capsule Take 30 mg by mouth at bedtime as needed.     . torsemide (DEMADEX) 20 MG tablet Take 2.5 tablets (50 mg total)  by mouth 2 (two) times daily. 450 tablet 3  . warfarin (COUMADIN) 2 MG tablet Take 2 mg by mouth daily. Takes one tablet (2mg ) everyday besides on Friday when she takes 2 tablets (4mg )     No current facility-administered medications for this visit.    Allergies:  Penicillins   Social History: The patient  reports that she quit smoking about 26 years ago. Her smoking use included Cigarettes. She has a 20 pack-year smoking history. She has never used smokeless tobacco. She reports that she does not drink alcohol or use illicit drugs.   ROS:  Please see the history of present illness. Otherwise, complete review of systems is positive for none.  All other systems are reviewed and negative.   Physical Exam: VS:  BP 108/72 mmHg  Pulse 73  Ht 5\' 8"  (1.727 m)  Wt 198 lb 12.8 oz (90.175 kg)  BMI 30.23 kg/m2  SpO2 96%, BMI  Body mass index is 30.23 kg/(m^2).  Wt Readings from Last 3 Encounters:  07/10/15 198 lb 12.8 oz (90.175 kg)  03/08/15 202 lb (91.627 kg)  01/01/15 199 lb 1.9 oz (90.32 kg)     Appears comfortable at rest.  HEENT: Conjunctiva and lids normal, oropharynx clear.  Neck: Supple, no elevated JVP or carotid bruits, no thyromegaly.  Lungs: Clear to auscultation, nonlabored breathing at rest.  Cardiac: Regular rate and rhythm, no S3 with crisp mechanical prosthetic click in S2., no pericardial rub.  Abdomen: Soft, nontender, protuberant, bowel sounds present.  Extremities: 1+ edema with venous stasis, distal pulses 1+. Skin: Warm and dry.   ECG: ECG is not ordered today.   Recent Labwork:  06/26/2015: INR 2.2  Other Studies Reviewed Today:  Echocardiogram 09/28/2014: Study Conclusions  - Left ventricle: The cavity size was normal. Wall thickness was increased in a pattern of mild LVH. Systolic function was normal. The estimated ejection fraction was in the range of 60% to 65%. Wall motion was normal; there were no regional wall motion abnormalities. The study is not technically sufficient to allow evaluation of LV diastolic function. Rhythm during study appears to be atrial flutter with 2:1 and variable conduction. - Aortic valve: A [employer]. Jude Medical mechanical prosthesis was present. There was no significant regurgitation. Mean gradient (S): 6 mm Hg. VTI ratio of LVOT to aortic valve: 0.58. - Mitral valve: Moderately calcified annulus. There was trivial regurgitation. - Left atrium: The atrium was mildly dilated. - Tricuspid valve: There was trivial regurgitation. - Pulmonary arteries: Systolic pressure could not be accurately estimated. - Pericardium, extracardiac: A prominent pericardial fat pad was present. There was no pericardial effusion.  Impressions:  - Mild LVH with LVEF 60-65%. Indeterminate diastolic function - rhythm during study  appears to be atrial flutter with 2:1 and variable conduction. Mild left atrial enlargement. Moderate MAC with trivial mitral regurgitation. Stable St. Jude aortic prosthesis with normal gradients and no aortic regurgitation. Unable to assess PASP.   Assessment and Plan:  1. CAD status post CABG in 2007. She continues on medical therapy without active angina symptoms, and we are holding off follow-up ischemic testing at this time.  2. History of stenosis with St. Jude mechanical AVR in 2007, stable function by echo [place] last year. She continued on Coumadin.  3. Paroxysmal atrial fibrillation/flutter, symptomatically stable and in sinus rhythm on examination today.  4. Chronic leg edema, component of diastolic dysfunction. She continues on diuretic therapy with stable weights recently. Requesting recent lab work for review.  Current  medicines were reviewed with the patient today.  Disposition: FU with me in 6 months.   Signed, Satira Sark, MD, [facility] 07/10/2015 10:22 AM    [facility] [place], [place], [place] [address] Phone: [phone]; Fax: [phone]

## 2015-08-07 ENCOUNTER — Ambulatory Visit (INDEPENDENT_AMBULATORY_CARE_PROVIDER_SITE_OTHER): Payer: Medicare Other | Admitting: *Deleted

## 2015-08-07 DIAGNOSIS — I4891 Unspecified atrial fibrillation: Secondary | ICD-10-CM

## 2015-08-07 DIAGNOSIS — Z952 Presence of prosthetic heart valve: Secondary | ICD-10-CM

## 2015-08-07 DIAGNOSIS — Z5181 Encounter for therapeutic drug level monitoring: Secondary | ICD-10-CM | POA: Diagnosis not present

## 2015-08-07 DIAGNOSIS — Z954 Presence of other heart-valve replacement: Secondary | ICD-10-CM

## 2015-08-07 DIAGNOSIS — Z7901 Long term (current) use of anticoagulants: Secondary | ICD-10-CM

## 2015-08-07 LAB — POCT INR: INR: 2

## 2015-09-18 ENCOUNTER — Ambulatory Visit (INDEPENDENT_AMBULATORY_CARE_PROVIDER_SITE_OTHER): Payer: Medicare Other | Admitting: *Deleted

## 2015-09-18 DIAGNOSIS — I4891 Unspecified atrial fibrillation: Secondary | ICD-10-CM | POA: Diagnosis not present

## 2015-09-18 DIAGNOSIS — Z5181 Encounter for therapeutic drug level monitoring: Secondary | ICD-10-CM | POA: Diagnosis not present

## 2015-09-18 DIAGNOSIS — Z952 Presence of prosthetic heart valve: Secondary | ICD-10-CM

## 2015-09-18 DIAGNOSIS — I480 Paroxysmal atrial fibrillation: Secondary | ICD-10-CM

## 2015-09-18 DIAGNOSIS — Z7901 Long term (current) use of anticoagulants: Secondary | ICD-10-CM | POA: Diagnosis not present

## 2015-09-18 DIAGNOSIS — Z954 Presence of other heart-valve replacement: Secondary | ICD-10-CM | POA: Diagnosis not present

## 2015-09-18 LAB — POCT INR: INR: 2.4

## 2015-10-30 ENCOUNTER — Ambulatory Visit (INDEPENDENT_AMBULATORY_CARE_PROVIDER_SITE_OTHER): Payer: Medicare Other | Admitting: *Deleted

## 2015-10-30 DIAGNOSIS — Z7901 Long term (current) use of anticoagulants: Secondary | ICD-10-CM | POA: Diagnosis not present

## 2015-10-30 DIAGNOSIS — Z954 Presence of other heart-valve replacement: Secondary | ICD-10-CM | POA: Diagnosis not present

## 2015-10-30 DIAGNOSIS — I4891 Unspecified atrial fibrillation: Secondary | ICD-10-CM | POA: Diagnosis not present

## 2015-10-30 DIAGNOSIS — Z5181 Encounter for therapeutic drug level monitoring: Secondary | ICD-10-CM

## 2015-10-30 DIAGNOSIS — Z952 Presence of prosthetic heart valve: Secondary | ICD-10-CM

## 2015-10-30 LAB — POCT INR: INR: 2.7

## 2015-11-15 ENCOUNTER — Telehealth: Payer: Self-pay | Admitting: Cardiology

## 2015-11-15 NOTE — Telephone Encounter (Signed)
Patient called stating that she received a telephone call yesterday from Dr. Melina Copa about her medications.

## 2015-11-16 NOTE — Telephone Encounter (Signed)
Patient called to see which form of metoprolol she was supposed to be on. Nurse advised patient that her medications profile list toprl xl 25 mg daily. Patient said that her new refill was for lopressor 25 mg twice daily. Nurse advised patient that she should take her lopressor as prescribed and that when she comes for her next office visit, that she could discuss with her doctor if the form needed to be changed. Patient said that her PCP has been refilling her lopressor and thinks this is how it was changed.

## 2015-12-11 ENCOUNTER — Ambulatory Visit (INDEPENDENT_AMBULATORY_CARE_PROVIDER_SITE_OTHER): Payer: Medicare Other | Admitting: *Deleted

## 2015-12-11 DIAGNOSIS — Z5181 Encounter for therapeutic drug level monitoring: Secondary | ICD-10-CM | POA: Diagnosis not present

## 2015-12-11 DIAGNOSIS — Z954 Presence of other heart-valve replacement: Secondary | ICD-10-CM | POA: Diagnosis not present

## 2015-12-11 DIAGNOSIS — Z7901 Long term (current) use of anticoagulants: Secondary | ICD-10-CM | POA: Diagnosis not present

## 2015-12-11 DIAGNOSIS — I4891 Unspecified atrial fibrillation: Secondary | ICD-10-CM

## 2015-12-11 DIAGNOSIS — Z952 Presence of prosthetic heart valve: Secondary | ICD-10-CM

## 2015-12-11 LAB — POCT INR: INR: 2.4

## 2015-12-27 ENCOUNTER — Ambulatory Visit (INDEPENDENT_AMBULATORY_CARE_PROVIDER_SITE_OTHER): Payer: Medicare Other | Admitting: Cardiology

## 2015-12-27 ENCOUNTER — Encounter: Payer: Self-pay | Admitting: Cardiology

## 2015-12-27 VITALS — BP 112/72 | HR 82 | Ht 68.0 in | Wt 189.0 lb

## 2015-12-27 DIAGNOSIS — R600 Localized edema: Secondary | ICD-10-CM

## 2015-12-27 DIAGNOSIS — I480 Paroxysmal atrial fibrillation: Secondary | ICD-10-CM

## 2015-12-27 DIAGNOSIS — Z952 Presence of prosthetic heart valve: Secondary | ICD-10-CM

## 2015-12-27 DIAGNOSIS — Z954 Presence of other heart-valve replacement: Secondary | ICD-10-CM | POA: Diagnosis not present

## 2015-12-27 NOTE — Patient Instructions (Signed)
Your physician recommends that you continue on your current medications as directed. Please refer to the Current Medication list given to you today. Your physician recommends that you schedule a follow-up appointment in: 6 months. You will receive a reminder letter in the mail in about 4 months reminding you to call and schedule your appointment. If you don't receive this letter, please contact our office. 

## 2015-12-27 NOTE — Progress Notes (Signed)
Cardiology Office Note  Date: 12/27/2015   ID: [name], DOB [birth year], MRN [id number]  PCP: Octavio Graves, DO  Primary Cardiologist: Rozann Lesches, MD   Chief Complaint  Patient presents with  . Coronary Artery Disease  . Atrial Fibrillation  . History of AVR    History of Present Illness: [name] is a 65 y.o. female last seen in September 2016. She presents for a routine follow-up visit. Overall doing well, no angina symptoms or significant palpitations. She has modified her diet and has lost at least 15 pounds. She feels much better, not having trouble with leg edema.  She continues on Coumadin with follow-up in the anticoagulation clinic. No reported bleeding problems.  I reviewed her medications. Cardiac regimen includes Crestor, Coumadin, Toprol-XL, Altace, and Demadex.   She continues to follow regularly with Dr. Melina Copa.  Past Medical History  Diagnosis Date  . Hyperlipidemia   . Coronary atherosclerosis of native coronary artery     Status post CABG 2007  . Atrial fibrillation ([name])   . Macromastia   . Anxiety disorder   . GERD (gastroesophageal reflux disease)   . RLS (restless legs syndrome)   . Peptic stricture of esophagus 10/04/2010    GE junction on last EGD by Dr. Gala Romney, benign biopsies  . History of colonoscopy 2003    Dr. Gala Romney - normal  . Schatzki's ring   . Carcinoid tumor of colon 2007  . Aortic stenosis     Status post St. Jude mechanical AVR 2007    Current Outpatient Prescriptions  Medication Sig Dispense Refill  . albuterol (PROVENTIL HFA;VENTOLIN HFA) 108 (90 BASE) MCG/ACT inhaler Inhale into the lungs as needed for wheezing or shortness of breath.    . allopurinol (ZYLOPRIM) 100 MG tablet Take 100 mg by mouth daily.    Marland Kitchen ALPRAZolam (XANAX XR) 1 MG 24 hr tablet Take 1 mg by mouth every morning.     . calcium carbonate (OS-CAL) 600 MG TABS tablet Take 600 mg by mouth 2 (two) times daily.    . cetirizine (ZYRTEC) 10 MG  tablet Take 10 mg by mouth daily.      . CRESTOR 10 MG tablet Take 10 mg by mouth daily.    . cyclobenzaprine (FLEXERIL) 10 MG tablet Take 10 mg by mouth as needed.     Marland Kitchen guaiFENesin-codeine (ROBITUSSIN AC) 100-10 MG/5ML syrup Take 10 mLs by mouth as needed.    Marland Kitchen HYDROcodone-acetaminophen (NORCO) 10-325 MG per tablet Take 1 tablet by mouth at bedtime as needed.     Marland Kitchen levothyroxine (SYNTHROID, LEVOTHROID) 50 MCG tablet Take 50 mcg by mouth daily.    . Magnesium Chloride (MAG-DELAY) 535 (64 MG) MG TBCR Take 1 tablet by mouth 2 (two) times daily.    . metoprolol succinate (TOPROL-XL) 25 MG 24 hr tablet Take 25 mg by mouth daily.      . multivitamin (THERAGRAN) per tablet Take 1 tablet by mouth daily.      Marland Kitchen omeprazole (PRILOSEC) 20 MG capsule Take 20 mg by mouth daily.     . ramipril (ALTACE) 2.5 MG capsule Take 2.5 mg by mouth daily.     Marland Kitchen rOPINIRole (REQUIP) 2 MG tablet Take 1 mg by mouth at bedtime.     . temazepam (RESTORIL) 30 MG capsule Take 30 mg by mouth at bedtime as needed.     . torsemide (DEMADEX) 20 MG tablet Take 2.5 tablets (50 mg total) by mouth 2 (two) times  daily. 450 tablet 3  . warfarin (COUMADIN) 2 MG tablet Take 2 mg by mouth daily. Takes one tablet (2mg ) everyday besides on Friday when she takes 2 tablets (4mg )     No current facility-administered medications for this visit.   Allergies:  Penicillins   Social History: The patient  reports that she quit smoking about 27 years ago. Her smoking use included Cigarettes. She has a 20 pack-year smoking history. She has never used smokeless tobacco. She reports that she does not drink alcohol or use illicit drugs.   ROS:  Please see the history of present illness. Otherwise, complete review of systems is positive for back pain.  All other systems are reviewed and negative.   Physical Exam: VS:  BP 112/72 mmHg  Pulse 82  Ht 5\' 8"  (1.727 m)  Wt 189 lb (85.73 kg)  BMI 28.74 kg/m2  SpO2 98%, BMI Body mass index is 28.74  kg/(m^2).  Wt Readings from Last 3 Encounters:  12/27/15 189 lb (85.73 kg)  07/10/15 198 lb 12.8 oz (90.175 kg)  03/08/15 202 lb (91.627 kg)    Appears comfortable at rest.  HEENT: Conjunctiva and lids normal, oropharynx clear.  Neck: Supple, no elevated JVP or carotid bruits, no thyromegaly.  Lungs: Clear to auscultation, nonlabored breathing at rest.  Cardiac: Regular rate and rhythm, no S3 with crisp mechanical prosthetic click in S2., no pericardial rub.  Abdomen: Soft, nontender, protuberant, bowel sounds present.  Extremities:  No significant pitting edema, distal pulses 1+.  ECG: I personally reviewed the prior tracing from 03/08/2015 which showed sinus rhythm with borderline prolonged QT interval.  Recent Labwork:  August 2016: Hemoglobin 11.6, platelets 291, BUN 15, creatinine 1.2, potassium 3.7, cholesterol 190, HDL 37, LDL 108, triglycerides 226, TSH 9.3  Other Studies Reviewed Today:  Echocardiogram 09/28/2014: Study Conclusions  - Left ventricle: The cavity size was normal. Wall thickness was increased in a pattern of mild LVH. Systolic function was normal. The estimated ejection fraction was in the range of 60% to 65%. Wall motion was normal; there were no regional wall motion abnormalities. The study is not technically sufficient to allow evaluation of LV diastolic function. Rhythm during study appears to be atrial flutter with 2:1 and variable conduction. - Aortic valve: A [employer]. Jude Medical mechanical prosthesis was present. There was no significant regurgitation. Mean gradient (S): 6 mm Hg. VTI ratio of LVOT to aortic valve: 0.58. - Mitral valve: Moderately calcified annulus. There was trivial regurgitation. - Left atrium: The atrium was mildly dilated. - Tricuspid valve: There was trivial regurgitation. - Pulmonary arteries: Systolic pressure could not be accurately estimated. - Pericardium, extracardiac: A prominent pericardial fat  pad was present. There was no pericardial effusion.  Impressions:  - Mild LVH with LVEF 60-65%. Indeterminate diastolic function - rhythm during study appears to be atrial flutter with 2:1 and variable conduction. Mild left atrial enlargement. Moderate MAC with trivial mitral regurgitation. Stable St. Jude aortic prosthesis with normal gradients and no aortic regurgitation. Unable to assess PASP.   Assessment and Plan:  1. History of atrial fibrillation, asymptomatic in terms of palpitations. She continues on Toprol-XL and Coumadin.   2. Aortic stenosis status post sig Jude AVR 2007. Valve function stable by echocardiogram within the last 2 years. She continues on Coumadin.  3. Leg edema, presently well-controlled through combination of diet with weight loss and diuretics.  Current medicines were reviewed with the patient today.  Disposition: FU with men 6 months.   Signed,  Satira Sark, MD, [facility] 12/27/2015 10:36 AM    [facility] at [place], [place], [place] [address] Phone: [phone]; Fax: [phone]

## 2016-01-22 ENCOUNTER — Ambulatory Visit (INDEPENDENT_AMBULATORY_CARE_PROVIDER_SITE_OTHER): Payer: Medicare Other | Admitting: *Deleted

## 2016-01-22 DIAGNOSIS — Z954 Presence of other heart-valve replacement: Secondary | ICD-10-CM | POA: Diagnosis not present

## 2016-01-22 DIAGNOSIS — I4891 Unspecified atrial fibrillation: Secondary | ICD-10-CM

## 2016-01-22 DIAGNOSIS — Z952 Presence of prosthetic heart valve: Secondary | ICD-10-CM

## 2016-01-22 DIAGNOSIS — Z5181 Encounter for therapeutic drug level monitoring: Secondary | ICD-10-CM | POA: Diagnosis not present

## 2016-01-22 DIAGNOSIS — Z7901 Long term (current) use of anticoagulants: Secondary | ICD-10-CM

## 2016-01-22 LAB — POCT INR: INR: 2.9

## 2016-03-04 ENCOUNTER — Ambulatory Visit (INDEPENDENT_AMBULATORY_CARE_PROVIDER_SITE_OTHER): Payer: Medicare Other | Admitting: *Deleted

## 2016-03-04 DIAGNOSIS — Z5181 Encounter for therapeutic drug level monitoring: Secondary | ICD-10-CM | POA: Diagnosis not present

## 2016-03-04 DIAGNOSIS — Z7901 Long term (current) use of anticoagulants: Secondary | ICD-10-CM | POA: Diagnosis not present

## 2016-03-04 DIAGNOSIS — I4891 Unspecified atrial fibrillation: Secondary | ICD-10-CM

## 2016-03-04 DIAGNOSIS — Z954 Presence of other heart-valve replacement: Secondary | ICD-10-CM

## 2016-03-04 DIAGNOSIS — Z952 Presence of prosthetic heart valve: Secondary | ICD-10-CM

## 2016-03-04 LAB — POCT INR: INR: 3.9

## 2016-03-25 ENCOUNTER — Ambulatory Visit (INDEPENDENT_AMBULATORY_CARE_PROVIDER_SITE_OTHER): Payer: Medicare Other | Admitting: *Deleted

## 2016-03-25 DIAGNOSIS — Z5181 Encounter for therapeutic drug level monitoring: Secondary | ICD-10-CM | POA: Diagnosis not present

## 2016-03-25 DIAGNOSIS — Z7901 Long term (current) use of anticoagulants: Secondary | ICD-10-CM

## 2016-03-25 DIAGNOSIS — Z954 Presence of other heart-valve replacement: Secondary | ICD-10-CM

## 2016-03-25 DIAGNOSIS — I4891 Unspecified atrial fibrillation: Secondary | ICD-10-CM | POA: Diagnosis not present

## 2016-03-25 DIAGNOSIS — Z952 Presence of prosthetic heart valve: Secondary | ICD-10-CM

## 2016-03-25 LAB — POCT INR: INR: 2.6

## 2016-04-15 ENCOUNTER — Ambulatory Visit (INDEPENDENT_AMBULATORY_CARE_PROVIDER_SITE_OTHER): Payer: Medicare Other | Admitting: *Deleted

## 2016-04-15 DIAGNOSIS — Z7901 Long term (current) use of anticoagulants: Secondary | ICD-10-CM | POA: Diagnosis not present

## 2016-04-15 DIAGNOSIS — Z954 Presence of other heart-valve replacement: Secondary | ICD-10-CM | POA: Diagnosis not present

## 2016-04-15 DIAGNOSIS — I4891 Unspecified atrial fibrillation: Secondary | ICD-10-CM | POA: Diagnosis not present

## 2016-04-15 DIAGNOSIS — Z952 Presence of prosthetic heart valve: Secondary | ICD-10-CM

## 2016-04-15 DIAGNOSIS — Z5181 Encounter for therapeutic drug level monitoring: Secondary | ICD-10-CM | POA: Diagnosis not present

## 2016-04-15 LAB — POCT INR: INR: 2.1

## 2016-05-15 ENCOUNTER — Ambulatory Visit (INDEPENDENT_AMBULATORY_CARE_PROVIDER_SITE_OTHER): Payer: Medicare Other | Admitting: *Deleted

## 2016-05-15 DIAGNOSIS — Z5181 Encounter for therapeutic drug level monitoring: Secondary | ICD-10-CM

## 2016-05-15 DIAGNOSIS — Z952 Presence of prosthetic heart valve: Secondary | ICD-10-CM

## 2016-05-15 DIAGNOSIS — Z954 Presence of other heart-valve replacement: Secondary | ICD-10-CM

## 2016-05-15 LAB — POCT INR: INR: 2.13

## 2016-06-12 ENCOUNTER — Ambulatory Visit (INDEPENDENT_AMBULATORY_CARE_PROVIDER_SITE_OTHER): Payer: Medicare Other | Admitting: *Deleted

## 2016-06-12 DIAGNOSIS — Z954 Presence of other heart-valve replacement: Secondary | ICD-10-CM

## 2016-06-12 DIAGNOSIS — Z952 Presence of prosthetic heart valve: Secondary | ICD-10-CM

## 2016-06-12 DIAGNOSIS — Z5181 Encounter for therapeutic drug level monitoring: Secondary | ICD-10-CM

## 2016-06-12 DIAGNOSIS — I4891 Unspecified atrial fibrillation: Secondary | ICD-10-CM

## 2016-06-12 LAB — PROTIME-INR: INR: 2.1 — AB (ref 0.9–1.1)

## 2016-07-11 ENCOUNTER — Other Ambulatory Visit: Payer: Self-pay | Admitting: Cardiology

## 2016-07-15 ENCOUNTER — Ambulatory Visit (INDEPENDENT_AMBULATORY_CARE_PROVIDER_SITE_OTHER): Payer: Medicare Other | Admitting: *Deleted

## 2016-07-15 DIAGNOSIS — I4891 Unspecified atrial fibrillation: Secondary | ICD-10-CM | POA: Diagnosis not present

## 2016-07-15 DIAGNOSIS — Z7901 Long term (current) use of anticoagulants: Secondary | ICD-10-CM | POA: Diagnosis not present

## 2016-07-15 DIAGNOSIS — Z952 Presence of prosthetic heart valve: Secondary | ICD-10-CM

## 2016-07-15 DIAGNOSIS — Z954 Presence of other heart-valve replacement: Secondary | ICD-10-CM | POA: Diagnosis not present

## 2016-07-15 DIAGNOSIS — Z5181 Encounter for therapeutic drug level monitoring: Secondary | ICD-10-CM | POA: Diagnosis not present

## 2016-07-15 LAB — POCT INR: INR: 2.2

## 2016-07-30 ENCOUNTER — Encounter: Payer: Self-pay | Admitting: *Deleted

## 2016-07-30 NOTE — Progress Notes (Signed)
Cardiology Office Note  Date: 07/31/2016   ID: [name], DOB Mar 18, 1951, MRN [id number]  PCP: Octavio Graves, DO  Primary Cardiologist: Rozann Lesches, MD   Chief Complaint  Patient presents with  . Atrial Fibrillation  . History of AVR    History of Present Illness: [name] is a 65 y.o. female last seen in March. She presents for a routine follow-up visit. Overall reports satisfaction with control of her chronic leg edema, although her legs are felt more tight recently iand her weight is up compared to last visit. She has been taking Demadex 50 mg twice daily. She does not report any chest pain or palpitations.  She continues on Coumadin, followed in the anticoagulation clinic. She is in atrial fibrillation today by follow-up ECG with nonspecific ST-T changes.  Last echocardiogram was in 2015 is outlined below.  Past Medical History:  Diagnosis Date  . Anxiety disorder   . Aortic stenosis    Status post St. Jude mechanical AVR 2007  . Atrial fibrillation ([place])   . Carcinoid tumor of colon 2007  . Coronary atherosclerosis of native coronary artery    Status post CABG 2007  . GERD (gastroesophageal reflux disease)   . History of colonoscopy 2003   Dr. Gala Romney - normal  . Hyperlipidemia   . Macromastia   . Peptic stricture of esophagus 10/04/2010   GE junction on last EGD by Dr. Gala Romney, benign biopsies  . RLS (restless legs syndrome)   . Schatzki's ring     Past Surgical History:  Procedure Laterality Date  . ABDOMINAL HYSTERECTOMY    . AORTIC VALVE REPLACEMENT  2007   #25 mm St. 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 APPENDECTOMY  2007  . BACK SURGERY     lumbar 4 and 5   . BIOPSY  02/05/2012   RMR:Two tongues of salmon-colored epithelium distal esophagus, very short-segment Barrett's s/p bx/Small hiatal hernia, otherwise normal stomach, D1, D2. Status post esophageal dilation. Biopsy showed GERD.  .  Breast cyst removed     bilateral  . BREAST REDUCTION SURGERY    . CESAREAN SECTION    . 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 COLONOSCOPY  02/05/2012   [id number] rectum, sigmoid diverticulosis,descending colon polyp , tubular adenoma  . CORONARY ARTERY BYPASS GRAFT     06/2006 - RIMA to RCA, SVG to RCA  . ESOPHAGOGASTRODUODENOSCOPY  10/03/2010   Dr. Gala Romney- schatzki's ring, shoft peptic stricture at GE junction.  Marland Kitchen FOOT SURGERY     bilateral bunionectomy  . LAPAROTOMY  2007   small bowel resection secondary to small bowel obstruction  . MALONEY DILATION  02/05/2012   Procedure: Venia Minks DILATION;  Surgeon: Daneil Dolin, MD;  Location: [facility];  Service: Endoscopy;  Laterality: N/A;  17mm   . Teeth removal      Current Outpatient Prescriptions  Medication Sig Dispense Refill  . albuterol (PROVENTIL HFA;VENTOLIN HFA) 108 (90 BASE) MCG/ACT inhaler Inhale into the lungs as needed for wheezing or shortness of breath.    . allopurinol (ZYLOPRIM) 100 MG tablet Take 100 mg by mouth daily.    Marland Kitchen ALPRAZolam (XANAX XR) 1 MG 24 hr tablet Take 1 mg by mouth every morning.     . calcium carbonate (OS-CAL) 600 MG TABS tablet Take 600 mg by mouth 2 (two) times daily.    . cetirizine (ZYRTEC) 10 MG tablet Take 10 mg  by mouth daily.      . CRESTOR 10 MG tablet Take 10 mg by mouth daily.    . cyclobenzaprine (FLEXERIL) 10 MG tablet Take 10 mg by mouth as needed.     Marland Kitchen guaiFENesin-codeine (ROBITUSSIN AC) 100-10 MG/5ML syrup Take 10 mLs by mouth as needed.    Marland Kitchen HYDROcodone-acetaminophen (NORCO) 10-325 MG per tablet Take 1 tablet by mouth at bedtime as needed.     Marland Kitchen levothyroxine (SYNTHROID, LEVOTHROID) 50 MCG tablet Take 50 mcg by mouth daily.    . Magnesium Chloride (MAG-DELAY) 535 (64 MG) MG TBCR Take 1 tablet by mouth 2 (two) times daily.    . metoprolol succinate (TOPROL-XL) 25 MG 24 hr tablet Take 25 mg by mouth daily.      . multivitamin (THERAGRAN) per tablet Take 1 tablet by  mouth daily.      Marland Kitchen omeprazole (PRILOSEC) 20 MG capsule Take 20 mg by mouth daily.     . ramipril (ALTACE) 2.5 MG capsule Take 2.5 mg by mouth daily.     Marland Kitchen rOPINIRole (REQUIP) 2 MG tablet Take 1 mg by mouth at bedtime.     . temazepam (RESTORIL) 30 MG capsule Take 30 mg by mouth at bedtime as needed.     . torsemide (DEMADEX) 20 MG tablet TAKE TWO AND ONE-HALF TABLETS TWICE A DAY 450 tablet 3  . warfarin (COUMADIN) 2 MG tablet Take 2 mg by mouth daily. Takes one tablet (2mg ) everyday besides on Friday when she takes 2 tablets (4mg )     No current facility-administered medications for this visit.    Allergies:  Penicillins   Social History: The patient  reports that she quit smoking about 27 years ago. Her smoking use included Cigarettes. She has a 20.00 pack-year smoking history. She has never used smokeless tobacco. She reports that she does not drink alcohol or use drugs.   ROS:  Please see the history of present illness. Otherwise, complete review of systems is positive for none.  All other systems are reviewed and negative.   Physical Exam: VS:  BP 98/60   Pulse 92   Ht 5\' 8"  (1.727 m)   Wt 201 lb (91.2 kg)   SpO2 93%   BMI 30.56 kg/m , BMI Body mass index is 30.56 kg/m.  Wt Readings from Last 3 Encounters:  07/31/16 201 lb (91.2 kg)  12/27/15 189 lb (85.7 kg)  07/10/15 198 lb 12.8 oz (90.2 kg)    Appears comfortable at rest.  HEENT: Conjunctiva and lids normal, oropharynx clear.  Neck: Supple, no elevated JVP or carotid bruits, no thyromegaly.  Lungs: Clear to auscultation, nonlabored breathing at rest.  Cardiac: Regular rate and rhythm, no S3 with crisp mechanical prosthetic click in S2., no pericardial rub.  Abdomen: Soft, nontender, protuberant, bowel sounds present.  Extremities:  Chronic appearing lower leg edema and venous stasis, distal pulses 1+.  ECG: I personally reviewed the tracing from 03/08/2015 which showed normal sinus rhythm with mildly prolonged QT  interval.  Recent Labwork:  August 2016: Hemoglobin 11.6, platelets 291, BUN 15, creatinine 1.2, potassium 3.7, cholesterol 190, HDL 37, LDL 108, triglycerides 226, TSH 9.3  Other Studies Reviewed Today:  Echocardiogram 09/28/2014: Study Conclusions  - Left ventricle: The cavity size was normal. Wall thickness was increased in a pattern of mild LVH. Systolic function was normal. The estimated ejection fraction was in the range of 60% to 65%. Wall motion was normal; there were no regional wall motion abnormalities. The  study is not technically sufficient to allow evaluation of LV diastolic function. Rhythm during study appears to be atrial flutter with 2:1 and variable conduction. - Aortic valve: A [employer]. Jude Medical mechanical prosthesis was present. There was no significant regurgitation. Mean gradient (S): 6 mm Hg. VTI ratio of LVOT to aortic valve: 0.58. - Mitral valve: Moderately calcified annulus. There was trivial regurgitation. - Left atrium: The atrium was mildly dilated. - Tricuspid valve: There was trivial regurgitation. - Pulmonary arteries: Systolic pressure could not be accurately estimated. - Pericardium, extracardiac: A prominent pericardial fat pad was present. There was no pericardial effusion.  Impressions:  - Mild LVH with LVEF 60-65%. Indeterminate diastolic function - rhythm during study appears to be atrial flutter with 2:1 and variable conduction. Mild left atrial enlargement. Moderate MAC with trivial mitral regurgitation. Stable St. Jude aortic prosthesis with normal gradients and no aortic regurgitation. Unable to assess PASP.  Assessment and Plan:  1. Persistent atrial fibrillation, no complaints of palpitations and heart rate adequately controlled on Toprol-XL. She continues on Coumadin with concurrent mechanical AVR.  2. History of St. Jude AVR in 2007 with stable valve function by echocardiogram in 2015.  3.  Chronic leg edema, continues on Demadex 50 mg twice daily. We discussed using 60 mg twice daily to see if this might be more beneficial, may also need to consider using as needed Zaroxolyn. She follows closely with Dr. Melina Copa.  Current medicines were reviewed with the patient today.   Orders Placed This Encounter  Procedures  . EKG 12-Lead    Disposition: Follow-up with me in 6 months.  Signed, Satira Sark, MD, [facility] 07/31/2016 9:48 AM    [facility] [facility] [place], [place], [place] [address] Phone: [phone]; Fax: [phone]

## 2016-07-31 ENCOUNTER — Ambulatory Visit (INDEPENDENT_AMBULATORY_CARE_PROVIDER_SITE_OTHER): Payer: Medicare Other | Admitting: Cardiology

## 2016-07-31 ENCOUNTER — Encounter: Payer: Self-pay | Admitting: Cardiology

## 2016-07-31 VITALS — BP 98/60 | HR 92 | Ht 68.0 in | Wt 201.0 lb

## 2016-07-31 DIAGNOSIS — Z952 Presence of prosthetic heart valve: Secondary | ICD-10-CM

## 2016-07-31 DIAGNOSIS — R600 Localized edema: Secondary | ICD-10-CM | POA: Diagnosis not present

## 2016-07-31 NOTE — Patient Instructions (Signed)

## 2016-08-26 ENCOUNTER — Ambulatory Visit (INDEPENDENT_AMBULATORY_CARE_PROVIDER_SITE_OTHER): Payer: Medicare Other | Admitting: *Deleted

## 2016-08-26 DIAGNOSIS — Z952 Presence of prosthetic heart valve: Secondary | ICD-10-CM

## 2016-08-26 DIAGNOSIS — Z7901 Long term (current) use of anticoagulants: Secondary | ICD-10-CM | POA: Diagnosis not present

## 2016-08-26 DIAGNOSIS — Z5181 Encounter for therapeutic drug level monitoring: Secondary | ICD-10-CM | POA: Diagnosis not present

## 2016-08-26 DIAGNOSIS — I4891 Unspecified atrial fibrillation: Secondary | ICD-10-CM | POA: Diagnosis not present

## 2016-08-26 LAB — POCT INR: INR: 2.8

## 2016-09-22 ENCOUNTER — Other Ambulatory Visit: Payer: Self-pay | Admitting: Dermatology

## 2016-10-07 ENCOUNTER — Ambulatory Visit (INDEPENDENT_AMBULATORY_CARE_PROVIDER_SITE_OTHER): Payer: Medicare Other | Admitting: *Deleted

## 2016-10-07 DIAGNOSIS — Z7901 Long term (current) use of anticoagulants: Secondary | ICD-10-CM | POA: Diagnosis not present

## 2016-10-07 DIAGNOSIS — I4891 Unspecified atrial fibrillation: Secondary | ICD-10-CM

## 2016-10-07 DIAGNOSIS — Z5181 Encounter for therapeutic drug level monitoring: Secondary | ICD-10-CM | POA: Diagnosis not present

## 2016-10-07 DIAGNOSIS — Z952 Presence of prosthetic heart valve: Secondary | ICD-10-CM | POA: Diagnosis not present

## 2016-10-07 LAB — POCT INR: INR: 2.4

## 2016-10-27 DIAGNOSIS — M172 Bilateral post-traumatic osteoarthritis of knee: Secondary | ICD-10-CM | POA: Diagnosis not present

## 2016-10-27 DIAGNOSIS — I38 Endocarditis, valve unspecified: Secondary | ICD-10-CM | POA: Diagnosis not present

## 2016-10-27 DIAGNOSIS — G2581 Restless legs syndrome: Secondary | ICD-10-CM | POA: Diagnosis not present

## 2016-10-27 DIAGNOSIS — I10 Essential (primary) hypertension: Secondary | ICD-10-CM | POA: Diagnosis not present

## 2016-10-27 DIAGNOSIS — M174 Other bilateral secondary osteoarthritis of knee: Secondary | ICD-10-CM | POA: Diagnosis not present

## 2016-10-27 DIAGNOSIS — E559 Vitamin D deficiency, unspecified: Secondary | ICD-10-CM | POA: Diagnosis not present

## 2016-10-27 DIAGNOSIS — M5136 Other intervertebral disc degeneration, lumbar region: Secondary | ICD-10-CM | POA: Diagnosis not present

## 2016-10-27 DIAGNOSIS — E785 Hyperlipidemia, unspecified: Secondary | ICD-10-CM | POA: Diagnosis not present

## 2016-10-27 DIAGNOSIS — R531 Weakness: Secondary | ICD-10-CM | POA: Diagnosis not present

## 2016-10-27 DIAGNOSIS — M9901 Segmental and somatic dysfunction of cervical region: Secondary | ICD-10-CM | POA: Diagnosis not present

## 2016-11-18 ENCOUNTER — Ambulatory Visit (INDEPENDENT_AMBULATORY_CARE_PROVIDER_SITE_OTHER): Payer: Medicare HMO | Admitting: *Deleted

## 2016-11-18 DIAGNOSIS — Z952 Presence of prosthetic heart valve: Secondary | ICD-10-CM

## 2016-11-18 DIAGNOSIS — Z5181 Encounter for therapeutic drug level monitoring: Secondary | ICD-10-CM

## 2016-11-18 DIAGNOSIS — Z7901 Long term (current) use of anticoagulants: Secondary | ICD-10-CM

## 2016-11-18 DIAGNOSIS — I4891 Unspecified atrial fibrillation: Secondary | ICD-10-CM

## 2016-11-18 LAB — POCT INR: INR: 3

## 2016-11-27 DIAGNOSIS — M9903 Segmental and somatic dysfunction of lumbar region: Secondary | ICD-10-CM | POA: Diagnosis not present

## 2016-11-27 DIAGNOSIS — I38 Endocarditis, valve unspecified: Secondary | ICD-10-CM | POA: Diagnosis not present

## 2016-11-27 DIAGNOSIS — E039 Hypothyroidism, unspecified: Secondary | ICD-10-CM | POA: Diagnosis not present

## 2016-11-27 DIAGNOSIS — I10 Essential (primary) hypertension: Secondary | ICD-10-CM | POA: Diagnosis not present

## 2016-11-27 DIAGNOSIS — M5136 Other intervertebral disc degeneration, lumbar region: Secondary | ICD-10-CM | POA: Diagnosis not present

## 2016-11-27 DIAGNOSIS — R69 Illness, unspecified: Secondary | ICD-10-CM | POA: Diagnosis not present

## 2016-11-27 DIAGNOSIS — Z6829 Body mass index (BMI) 29.0-29.9, adult: Secondary | ICD-10-CM | POA: Diagnosis not present

## 2016-11-27 DIAGNOSIS — E782 Mixed hyperlipidemia: Secondary | ICD-10-CM | POA: Diagnosis not present

## 2016-11-27 DIAGNOSIS — M109 Gout, unspecified: Secondary | ICD-10-CM | POA: Diagnosis not present

## 2016-12-17 ENCOUNTER — Encounter (HOSPITAL_COMMUNITY): Payer: Self-pay | Admitting: *Deleted

## 2016-12-17 ENCOUNTER — Emergency Department (HOSPITAL_COMMUNITY): Payer: Medicare HMO

## 2016-12-17 ENCOUNTER — Inpatient Hospital Stay (HOSPITAL_COMMUNITY)
Admission: EM | Admit: 2016-12-17 | Discharge: 2016-12-20 | DRG: 190 | Disposition: A | Discharge status: Home / Self Care | Payer: Medicare HMO | Attending: Family Medicine | Admitting: Family Medicine

## 2016-12-17 DIAGNOSIS — Z87891 Personal history of nicotine dependence: Secondary | ICD-10-CM

## 2016-12-17 DIAGNOSIS — R0602 Shortness of breath: Secondary | ICD-10-CM | POA: Diagnosis not present

## 2016-12-17 DIAGNOSIS — R062 Wheezing: Secondary | ICD-10-CM | POA: Diagnosis not present

## 2016-12-17 DIAGNOSIS — Z809 Family history of malignant neoplasm, unspecified: Secondary | ICD-10-CM | POA: Diagnosis not present

## 2016-12-17 DIAGNOSIS — I5033 Acute on chronic diastolic (congestive) heart failure: Secondary | ICD-10-CM | POA: Diagnosis present

## 2016-12-17 DIAGNOSIS — R079 Chest pain, unspecified: Secondary | ICD-10-CM | POA: Diagnosis not present

## 2016-12-17 DIAGNOSIS — G2581 Restless legs syndrome: Secondary | ICD-10-CM | POA: Diagnosis present

## 2016-12-17 DIAGNOSIS — I482 Chronic atrial fibrillation, unspecified: Secondary | ICD-10-CM | POA: Diagnosis present

## 2016-12-17 DIAGNOSIS — Z823 Family history of stroke: Secondary | ICD-10-CM | POA: Diagnosis not present

## 2016-12-17 DIAGNOSIS — J441 Chronic obstructive pulmonary disease with (acute) exacerbation: Principal | ICD-10-CM | POA: Diagnosis present

## 2016-12-17 DIAGNOSIS — R0989 Other specified symptoms and signs involving the circulatory and respiratory systems: Secondary | ICD-10-CM | POA: Diagnosis not present

## 2016-12-17 DIAGNOSIS — F419 Anxiety disorder, unspecified: Secondary | ICD-10-CM | POA: Diagnosis present

## 2016-12-17 DIAGNOSIS — I5042 Chronic combined systolic (congestive) and diastolic (congestive) heart failure: Secondary | ICD-10-CM | POA: Diagnosis not present

## 2016-12-17 DIAGNOSIS — I4891 Unspecified atrial fibrillation: Secondary | ICD-10-CM | POA: Diagnosis present

## 2016-12-17 DIAGNOSIS — I2510 Atherosclerotic heart disease of native coronary artery without angina pectoris: Secondary | ICD-10-CM | POA: Diagnosis present

## 2016-12-17 DIAGNOSIS — Z79899 Other long term (current) drug therapy: Secondary | ICD-10-CM | POA: Diagnosis not present

## 2016-12-17 DIAGNOSIS — I081 Rheumatic disorders of both mitral and tricuspid valves: Secondary | ICD-10-CM | POA: Diagnosis present

## 2016-12-17 DIAGNOSIS — Z951 Presence of aortocoronary bypass graft: Secondary | ICD-10-CM | POA: Diagnosis not present

## 2016-12-17 DIAGNOSIS — Z952 Presence of prosthetic heart valve: Secondary | ICD-10-CM

## 2016-12-17 DIAGNOSIS — Z880 Allergy status to penicillin: Secondary | ICD-10-CM | POA: Diagnosis not present

## 2016-12-17 DIAGNOSIS — Z7901 Long term (current) use of anticoagulants: Secondary | ICD-10-CM

## 2016-12-17 DIAGNOSIS — Z90710 Acquired absence of both cervix and uterus: Secondary | ICD-10-CM

## 2016-12-17 DIAGNOSIS — R05 Cough: Secondary | ICD-10-CM | POA: Diagnosis not present

## 2016-12-17 DIAGNOSIS — I38 Endocarditis, valve unspecified: Secondary | ICD-10-CM | POA: Diagnosis not present

## 2016-12-17 DIAGNOSIS — I509 Heart failure, unspecified: Secondary | ICD-10-CM | POA: Diagnosis not present

## 2016-12-17 DIAGNOSIS — E669 Obesity, unspecified: Secondary | ICD-10-CM | POA: Diagnosis present

## 2016-12-17 DIAGNOSIS — E876 Hypokalemia: Secondary | ICD-10-CM | POA: Diagnosis present

## 2016-12-17 DIAGNOSIS — E785 Hyperlipidemia, unspecified: Secondary | ICD-10-CM | POA: Diagnosis not present

## 2016-12-17 DIAGNOSIS — Z6831 Body mass index (BMI) 31.0-31.9, adult: Secondary | ICD-10-CM | POA: Diagnosis not present

## 2016-12-17 DIAGNOSIS — Z6829 Body mass index (BMI) 29.0-29.9, adult: Secondary | ICD-10-CM | POA: Diagnosis not present

## 2016-12-17 DIAGNOSIS — K219 Gastro-esophageal reflux disease without esophagitis: Secondary | ICD-10-CM | POA: Diagnosis present

## 2016-12-17 HISTORY — DX: Unspecified asthma, uncomplicated: J45.909

## 2016-12-17 HISTORY — DX: Localized edema: R60.0

## 2016-12-17 LAB — CBC WITH DIFFERENTIAL/PLATELET
BASOS ABS: 0 10*3/uL (ref 0.0–0.1)
BASOS PCT: 0 %
Eosinophils Absolute: 0.1 10*3/uL (ref 0.0–0.7)
Eosinophils Relative: 1 %
HEMATOCRIT: 30 % — AB (ref 36.0–46.0)
Hemoglobin: 9.9 g/dL — ABNORMAL LOW (ref 12.0–15.0)
LYMPHS PCT: 20 %
Lymphs Abs: 1.9 10*3/uL (ref 0.7–4.0)
MCH: 29.6 pg (ref 26.0–34.0)
MCHC: 33 g/dL (ref 30.0–36.0)
MCV: 89.6 fL (ref 78.0–100.0)
Monocytes Absolute: 0.8 10*3/uL (ref 0.1–1.0)
Monocytes Relative: 8 %
NEUTROS ABS: 6.6 10*3/uL (ref 1.7–7.7)
NEUTROS PCT: 71 %
Platelets: 250 10*3/uL (ref 150–400)
RBC: 3.35 MIL/uL — AB (ref 3.87–5.11)
RDW: 15.3 % (ref 11.5–15.5)
WBC: 9.4 10*3/uL (ref 4.0–10.5)

## 2016-12-17 LAB — PROTIME-INR
INR: 2.16
PROTHROMBIN TIME: 24.5 s — AB (ref 11.4–15.2)

## 2016-12-17 LAB — BASIC METABOLIC PANEL
ANION GAP: 5 (ref 5–15)
BUN: 16 mg/dL (ref 6–20)
CHLORIDE: 100 mmol/L — AB (ref 101–111)
CO2: 29 mmol/L (ref 22–32)
Calcium: 8.3 mg/dL — ABNORMAL LOW (ref 8.9–10.3)
Creatinine, Ser: 1.26 mg/dL — ABNORMAL HIGH (ref 0.44–1.00)
GFR calc non Af Amer: 44 mL/min — ABNORMAL LOW (ref >60)
GFR, EST AFRICAN AMERICAN: 51 mL/min — AB (ref >60)
GLUCOSE: 98 mg/dL (ref 65–99)
Potassium: 3.3 mmol/L — ABNORMAL LOW (ref 3.5–5.1)
Sodium: 134 mmol/L — ABNORMAL LOW (ref 135–145)

## 2016-12-17 LAB — BRAIN NATRIURETIC PEPTIDE: B Natriuretic Peptide: 354 pg/mL — ABNORMAL HIGH (ref 0.0–100.0)

## 2016-12-17 LAB — TROPONIN I: Troponin I: <0.03 ng/mL (ref <0.03)

## 2016-12-17 MED ORDER — IPRATROPIUM BROMIDE 0.02 % IN SOLN
1.0000 mg | Freq: Once | RESPIRATORY_TRACT | Status: AC
Start: 2016-12-17 — End: 2016-12-17
  Administered 2016-12-17: 1 mg via RESPIRATORY_TRACT
  Filled 2016-12-17: qty 5

## 2016-12-17 MED ORDER — ALBUTEROL (5 MG/ML) CONTINUOUS INHALATION SOLN
10.0000 mg/h | INHALATION_SOLUTION | Freq: Once | RESPIRATORY_TRACT | Status: AC
  Administered 2016-12-17: 10 mg/h via RESPIRATORY_TRACT
  Filled 2016-12-17: qty 20

## 2016-12-17 MED ORDER — FUROSEMIDE 10 MG/ML IJ SOLN
60.0000 mg | Freq: Once | INTRAMUSCULAR | Status: AC
  Administered 2016-12-17: 60 mg via INTRAVENOUS
  Filled 2016-12-17: qty 6

## 2016-12-17 NOTE — ED Provider Notes (Signed)
[facility] DEPT Provider Note   CSN: [id number] Arrival date & time: 12/17/16  1936     History   Chief Complaint Chief Complaint  Patient presents with  . Cough    HPI [name] is a 66 y.o. female.  HPI  Pt was seen at 2000. Per pt, c/o gradual onset and persistence of constant wheezing, cough and SOB for the past 3 days. Pt states she was evaluated by her PMD today, had an outpatient CXR performed, and was "told to come to the ED because I might have congestive heart failure." Pt states she received a neb treatment in the doctor's office today with "some" improvement. Endorses hx of asthma and "thinks that's what this is." Pt's significant other also with cough. Denies fevers, no CP/palpitations, no abd pain, no N/V/D, no back pain.   Past Medical History:  Diagnosis Date  . Anxiety disorder   . Aortic stenosis    Status post St. Jude mechanical AVR 2007  . Asthma   . Atrial fibrillation ([place])   . Carcinoid tumor of colon 2007  . Coronary atherosclerosis of native coronary artery    Status post CABG 2007  . GERD (gastroesophageal reflux disease)   . History of colonoscopy 2003   Dr. Gala Romney - normal  . Hyperlipidemia   . Macromastia   . Pedal edema    bilateral, chronic  . Peptic stricture of esophagus 10/04/2010   GE junction on last EGD by Dr. Gala Romney, benign biopsies  . RLS (restless legs syndrome)   . Schatzki's ring     Patient Active Problem List   Diagnosis Date Noted  . Bilateral leg edema 07/05/2014  . Encounter for therapeutic drug monitoring 11/15/2013  . Chronic venous insufficiency 08/26/2011  . Long term (current) use of anticoagulants 01/11/2011  . GERD 09/16/2010  . Aortic valve replaced 06/05/2009  . HYPERLIPIDEMIA-MIXED 06/04/2009  . Coronary atherosclerosis of native coronary artery 06/04/2009  . Atrial fibrillation ([place]) 06/04/2009    Past Surgical History:  Procedure Laterality Date  . ABDOMINAL HYSTERECTOMY    . AORTIC VALVE  REPLACEMENT  2007   #25 mm St. 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 APPENDECTOMY  2007  . BACK SURGERY     lumbar 4 and 5   . BIOPSY  02/05/2012   RMR:Two tongues of salmon-colored epithelium distal esophagus, very short-segment Barrett's s/p bx/Small hiatal hernia, otherwise normal stomach, D1, D2. Status post esophageal dilation. Biopsy showed GERD.  . Breast cyst removed     bilateral  . BREAST REDUCTION SURGERY    . CESAREAN SECTION    . 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 COLONOSCOPY  02/05/2012   [id number] rectum, sigmoid diverticulosis,descending colon polyp , tubular adenoma  . CORONARY ARTERY BYPASS GRAFT     06/2006 - RIMA to RCA, SVG to RCA  . ESOPHAGOGASTRODUODENOSCOPY  10/03/2010   Dr. Gala Romney- schatzki's ring, shoft peptic stricture at GE junction.  [facility] FOOT SURGERY     bilateral bunionectomy  . HERNIA REPAIR     with mesh  . LAPAROTOMY  2007   small bowel resection secondary to small bowel obstruction  . MALONEY DILATION  02/05/2012   Procedure: Venia Minks DILATION;  Surgeon: Daneil Dolin, MD;  Location: [facility];  Service: Endoscopy;  Laterality: N/A;  74mm   . Teeth removal      OB History    No data available  Home Medications    Prior to Admission medications   Medication Sig Start Date End Date Taking? Authorizing Provider  albuterol (PROVENTIL HFA;VENTOLIN HFA) 108 (90 BASE) MCG/ACT inhaler Inhale 1-2 puffs into the lungs every 6 (six) hours as needed for wheezing or shortness of breath.    Yes Historical Provider, MD  allopurinol (ZYLOPRIM) 100 MG tablet Take 100 mg by mouth daily.   Yes Historical Provider, MD  ALPRAZolam Duanne Moron) 1 MG tablet Take 1 mg by mouth 3 (three) times daily as needed for anxiety.  09/20/16  Yes Historical Provider, MD  calcium carbonate (OS-CAL) 600 MG TABS tablet Take 600 mg by mouth 2 (two) times daily.   Yes Historical Provider, MD  cetirizine (ZYRTEC) 10  MG tablet Take 10 mg by mouth daily.     Yes Historical Provider, MD  CRESTOR 10 MG tablet Take 10 mg by mouth at bedtime.  12/02/13  Yes Historical Provider, MD  cyclobenzaprine (FLEXERIL) 10 MG tablet Take 10 mg by mouth at bedtime.    Yes Historical Provider, MD  HYDROcodone-acetaminophen (NORCO) 10-325 MG per tablet Take 1 tablet by mouth at bedtime.    Yes Historical Provider, MD  levothyroxine (SYNTHROID, LEVOTHROID) 50 MCG tablet Take 50 mcg by mouth every morning.    Yes Historical Provider, MD  Magnesium Chloride (MAG-DELAY) 535 (64 MG) MG TBCR Take 1 tablet by mouth at bedtime.    Yes Historical Provider, MD  metoprolol succinate (TOPROL-XL) 25 MG 24 hr tablet Take 25 mg by mouth daily.     Yes Historical Provider, MD  Multiple Vitamin (MULTIVITAMIN WITH MINERALS) TABS tablet Take 1 tablet by mouth daily.   Yes Historical Provider, MD  omeprazole (PRILOSEC) 20 MG capsule Take 20 mg by mouth daily.  02/22/13  Yes Historical Provider, MD  ramipril (ALTACE) 2.5 MG capsule Take 2.5 mg by mouth daily.    Yes Historical Provider, MD  rOPINIRole (REQUIP) 3 MG tablet Take 6 mg by mouth at bedtime.    Yes Historical Provider, MD  temazepam (RESTORIL) 30 MG capsule Take 30 mg by mouth at bedtime as needed for sleep.    Yes Historical Provider, MD  torsemide (DEMADEX) 20 MG tablet TAKE TWO AND ONE-HALF TABLETS TWICE A DAY Patient taking differently: TAKE THREE TABLETS TWICE A DAY 07/11/16  Yes Satira Sark, MD  warfarin (COUMADIN) 2 MG tablet Take 2-4 mg by mouth every evening. Takes one tablet (2mg ) everyday besides on Sunday when she takes 2 tablets (4mg )   Yes Historical Provider, MD    Family History Family History  Problem Relation Age of Onset  . Stroke Mother   . Cancer Father   . Anesthesia problems Neg Hx   . Hypotension Neg Hx   . Malignant hyperthermia Neg Hx   . Pseudochol deficiency Neg Hx     Social History Social History  Substance Use Topics  . Smoking status: Former  Smoker    Packs/day: 1.00    Years: 20.00    Types: Cigarettes    Quit date: 10/20/1988  . Smokeless tobacco: Never Used  . Alcohol use No     Allergies   Penicillins   Review of Systems Review of Systems ROS: Statement: All systems negative except as marked or noted in the HPI; Constitutional: Negative for fever and chills. ; ; Eyes: Negative for eye pain, redness and discharge. ; ; ENMT: Negative for ear pain, hoarseness, nasal congestion, sinus pressure and sore throat. ; ; Cardiovascular: Negative for  chest pain, palpitations, diaphoresis, and peripheral edema. ; ; Respiratory: +cough, wheezing, SOB. Negative for stridor. ; ; Gastrointestinal: Negative for nausea, vomiting, diarrhea, abdominal pain, blood in stool, hematemesis, jaundice and rectal bleeding. . ; ; Genitourinary: Negative for dysuria, flank pain and hematuria. ; ; Musculoskeletal: Negative for back pain and neck pain. Negative for swelling and trauma.; ; Skin: Negative for pruritus, rash, abrasions, blisters, bruising and skin lesion.; ; Neuro: Negative for headache, lightheadedness and neck stiffness. Negative for weakness, altered level of consciousness, altered mental status, extremity weakness, paresthesias, involuntary movement, seizure and syncope.       Physical Exam Updated Vital Signs BP 108/72 (BP Location: Left Arm)   Pulse 93   Temp 98.2 F (36.8 C) (Oral)   Resp 24   Ht 5\' 8"  (1.727 m)   Wt 195 lb (88.5 kg)   SpO2 98%   BMI 29.65 kg/m    Physical Exam 2005: Physical examination:  Nursing notes reviewed; Vital signs and O2 SAT reviewed;  Constitutional: Well developed, Well nourished, Well hydrated, In no acute distress; Head:  Normocephalic, atraumatic; Eyes: EOMI, PERRL, No scleral icterus; ENMT: Mouth and pharynx normal, Mucous membranes moist; Neck: Supple, Full range of motion, No lymphadenopathy; Cardiovascular: Iegular rate and rhythm, No gallop; Respiratory: Breath sounds diminished & equal  bilaterally, insp/exp wheezes bilat. No audible wheezing. Frequent cough during exam. Speaking full sentences, sitting upright. Mildly tachypneic.; Chest: Nontender, Movement normal; Abdomen: Soft, Nontender, Nondistended, Normal bowel sounds; Genitourinary: No CVA tenderness; Extremities: Pulses normal, No tenderness, +1 pedal edema bilat with chronic stasis changes. No calf asymmetry.; Neuro: AA&Ox3, Major CN grossly intact.  Speech clear. No gross focal motor or sensory deficits in extremities.; Skin: Color normal, Warm, Dry.   ED Treatments / Results  Labs (all labs ordered are listed, but only abnormal results are displayed)   EKG  EKG Interpretation  Date/Time:  Wednesday December 17 2016 20:38:34 EST Ventricular Rate:  92 PR Interval:    QRS Duration: 69 QT Interval:  368 QTC Calculation: 456 R Axis:   61 Text Interpretation:  Atrial fibrillation Low voltage, precordial leads Borderline repolarization abnormality Baseline wander When compared with ECG of 07/31/2016 No significant change was found Confirmed by [facility]  MD, Nunzio Cory [phone]) on 12/17/2016 8:54:05 PM       Radiology   Procedures Procedures (including critical care time)  Medications Ordered in ED Medications  albuterol (PROVENTIL,VENTOLIN) solution continuous neb (10 mg/hr Nebulization Given 12/17/16 2021)  ipratropium (ATROVENT) nebulizer solution 1 mg (1 mg Nebulization Given 12/17/16 2020)     Initial Impression / Assessment and Plan / ED Course  I have reviewed the triage vital signs and the nursing notes.  Pertinent labs & imaging results that were available during my care of the patient were reviewed by me and considered in my medical decision making (see chart for details).  MDM Reviewed: previous chart, nursing note and vitals Reviewed previous: labs and ECG Interpretation: labs and ECG   Results for orders placed or performed during the hospital encounter of [id number]  Basic metabolic panel    Result Value Ref Range   Sodium 134 (L) 135 - 145 mmol/L   Potassium 3.3 (L) 3.5 - 5.1 mmol/L   Chloride 100 (L) 101 - 111 mmol/L   CO2 29 22 - 32 mmol/L   Glucose, Bld 98 65 - 99 mg/dL   BUN 16 6 - 20 mg/dL   Creatinine, Ser 1.26 (H) 0.44 - 1.00 mg/dL   Calcium  8.3 (L) 8.9 - 10.3 mg/dL   GFR calc non Af Amer 44 (L) >60 mL/min   GFR calc Af Amer 51 (L) >60 mL/min   Anion gap 5 5 - 15  Brain natriuretic peptide  Result Value Ref Range   B Natriuretic Peptide 354.0 (H) 0.0 - 100.0 pg/mL  Protime-INR  Result Value Ref Range   Prothrombin Time 24.5 (H) 11.4 - 15.2 seconds   INR 2.16   CBC with Differential  Result Value Ref Range   WBC 9.4 4.0 - 10.5 K/uL   RBC 3.35 (L) 3.87 - 5.11 MIL/uL   Hemoglobin 9.9 (L) 12.0 - 15.0 g/dL   HCT 30.0 (L) 36.0 - 46.0 %   MCV 89.6 78.0 - 100.0 fL   MCH 29.6 26.0 - 34.0 pg   MCHC 33.0 30.0 - 36.0 g/dL   RDW 15.3 11.5 - 15.5 %   Platelets 250 150 - 400 K/uL   Neutrophils Relative % 71 %   Neutro Abs 6.6 1.7 - 7.7 K/uL   Lymphocytes Relative 20 %   Lymphs Abs 1.9 0.7 - 4.0 K/uL   Monocytes Relative 8 %   Monocytes Absolute 0.8 0.1 - 1.0 K/uL   Eosinophils Relative 1 %   Eosinophils Absolute 0.1 0.0 - 0.7 K/uL   Basophils Relative 0 %   Basophils Absolute 0.0 0.0 - 0.1 K/uL  Troponin I  Result Value Ref Range   Troponin I <0.03 <0.03 ng/mL    2205:  On arrival: pt sitting upright, lungs diminished/wheezing with frequent coughing. Hour long neb started. BNP mildly elevated; no old to compare. Pt completing neb. Will need CXR, IV lasix/steroid, and ambulation with pulse ox. Dispo based on results. Sign out to Dr. Laverta Baltimore.    Final Clinical Impressions(s) / ED Diagnoses   Final diagnoses:  None    New Prescriptions New Prescriptions   No medications on file     Francine Graven, DO 12/17/16 2209

## 2016-12-17 NOTE — ED Provider Notes (Signed)
Blood pressure 108/72, pulse 93, temperature 98.2 F (36.8 C), temperature source Oral, resp. rate 24, height 5\' 8"  (1.727 m), weight 195 lb (88.5 kg), SpO2 98 %.  Assuming care from Dr. Thurnell Garbe.  In short, [name] is a 66 y.o. female with a chief complaint of Cough .  Refer to the original H&P for additional details.  The current plan of care is to follow up CXR and reassess the patient.  10:30 PM Patient with pulmonary edema on CXR. She has increased WOB on my exam with rales and expiratory wheezing that seems improved from ED presentation. Plan for diuresis and repeat ECHO during admission.   Discussed patient's case with hospitalist, Dr. Marin Comment. Patient and family (if present) updated with plan. Care transferred to hospitalist service.  I reviewed all nursing notes, vitals, pertinent old records, EKGs, labs, imaging (as available).  CXR:  FINDINGS:  Post median sternotomy with prosthetic aortic valve. Unchanged heart  size and mediastinal contours with mild cardiomegaly. Diffuse  prominent interstitial markings which were not seen on prior exam.  No confluent airspace disease. No pleural fluid or pneumothorax. No  acute osseous abnormalities.    IMPRESSION:  Prominent interstitial opacities which may be infectious or  pulmonary edema.      Electronically Signed  By: Jeb Levering M.D.  On: 12/17/2016 22:03    Amy Quinton, MD Emergency Medicine   [name], MD 12/18/16 1346

## 2016-12-17 NOTE — ED Triage Notes (Signed)
Pt c/o cough x 3 days and states she is coughing up white sputum; pt states she went to [facility] and had xrays made that states she may have congestive heart failure

## 2016-12-17 NOTE — H&P (Signed)
History and Physical    [name] [id number] DOB: [birth year] DOA: 12/17/2016  PCP: Octavio Graves, DO  Patient coming from: Home.    Chief Complaint:   DOE.   HPI: [name] is an 66 y.o. 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  Evaluation in the ER included a BNP 350. CXR with edema vs infiltrate, and EKG showed afib with rate controlled and no ischemic changes, Hb of 9 g per dL, K of 3.3 mm/L, and Cr of 1.26.  She was given Neb and felt better.  She was also given IV lasix 60mg  just now, and hospitalist was asked to admit her for acute on chronic diastolic CHF.  She has no chest pian and her troponin was negaitve.   ED Course:  See above.  Rewiew of Systems:  Constitutional: Negative for malaise, fever and chills. No significant weight loss or weight gain Eyes: Negative for eye pain, redness and discharge, diplopia, visual changes, or flashes of light. ENMT: Negative for ear pain, hoarseness, nasal congestion, sinus pressure and sore throat. No headaches; tinnitus, drooling, or problem swallowing. Cardiovascular: Negative for chest pain, palpitations, diaphoresis, and peripheral edema. ; PND Respiratory: Negative for cough, hemoptysis, wheezing and stridor. No pleuritic chestpain. Gastrointestinal: Negative for diarrhea, constipation,  melena, blood in stool, hematemesis, jaundice and rectal bleeding.    Genitourinary: Negative for frequency, dysuria, incontinence,flank pain and hematuria; Musculoskeletal: Negative for back pain and neck pain. Negative for swelling and trauma.;  Skin: . Negative for pruritus, rash, abrasions, bruising and skin lesion.; ulcerations Neuro: Negative for headache, lightheadedness and neck stiffness. Negative for  weakness, altered level of consciousness , altered mental status, extremity weakness, burning feet, involuntary movement, seizure and syncope.  Psych: negative for anxiety, depression, insomnia, tearfulness, panic attacks, hallucinations, paranoia, suicidal or homicidal ideation    Past Medical History:  Diagnosis Date  . Anxiety disorder   . Aortic stenosis    Status post St. Jude mechanical AVR 2007  . Asthma   . Atrial fibrillation ([place])   . Carcinoid tumor of colon 2007  . Coronary atherosclerosis of native coronary artery    Status post CABG 2007  . GERD (gastroesophageal reflux disease)   . History of colonoscopy 2003   Dr. Gala Romney - normal  . Hyperlipidemia   . Macromastia   . Pedal edema    bilateral, chronic  . Peptic stricture of esophagus 10/04/2010   GE junction on last EGD by Dr. Gala Romney, benign biopsies  . RLS (restless legs syndrome)   . Schatzki's ring     Past Surgical History:  Procedure Laterality Date  . ABDOMINAL HYSTERECTOMY    . AORTIC VALVE REPLACEMENT  2007   #25 mm St. 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 APPENDECTOMY  2007  . BACK SURGERY     lumbar 4 and 5   . BIOPSY  02/05/2012   RMR:Two tongues of salmon-colored epithelium distal esophagus, very short-segment Barrett's s/p bx/Small hiatal hernia, otherwise normal stomach, D1, D2. Status post esophageal dilation. Biopsy showed GERD.  . Breast cyst removed     bilateral  . BREAST REDUCTION SURGERY    . CESAREAN SECTION    . 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 COLONOSCOPY  02/05/2012   [id number] rectum, sigmoid diverticulosis,descending colon polyp , tubular adenoma  . CORONARY ARTERY BYPASS GRAFT     06/2006 - RIMA to RCA, SVG to RCA  . ESOPHAGOGASTRODUODENOSCOPY  10/03/2010   Dr. Gala Romney- schatzki's ring, shoft peptic stricture at GE junction.  Marland Kitchen FOOT SURGERY     bilateral bunionectomy  . HERNIA REPAIR     with mesh  .  LAPAROTOMY  2007   small bowel resection secondary to small bowel obstruction  . MALONEY DILATION  02/05/2012   Procedure: Venia Minks DILATION;  Surgeon: Daneil Dolin, MD;  Location: [facility];  Service: Endoscopy;  Laterality: N/A;  61mm   . Teeth removal       reports that she quit smoking about 28 years ago. Her smoking use included Cigarettes. She has a 20.00 pack-year smoking history. She has never used smokeless tobacco. She reports that she does not drink alcohol or use drugs.  Allergies  Allergen Reactions  . Penicillins Hives, Itching, Swelling and Rash    Has patient had a PCN reaction causing immediate rash, facial/tongue/throat swelling, SOB or lightheadedness with hypotension: Yes Has patient had a PCN reaction causing severe rash involving mucus membranes or skin necrosis: Yes Has patient had a PCN reaction that required hospitalization: unknown Has patient had a PCN reaction occurring within the last 10 years: No If all of the above answers are "NO", then may proceed with Cephalosporin use.    Throat swelling    Family History  Problem Relation Age of Onset  . Stroke Mother   . Cancer Father   . Anesthesia problems Neg Hx   . Hypotension Neg Hx   . Malignant hyperthermia Neg Hx   . Pseudochol deficiency Neg Hx      Prior to Admission medications   Medication Sig Start Date End Date Taking? Authorizing Provider  albuterol (PROVENTIL HFA;VENTOLIN HFA) 108 (90 BASE) MCG/ACT inhaler Inhale 1-2 puffs into the lungs every 6 (six) hours as needed for wheezing or shortness of breath.    Yes Historical Provider, MD  allopurinol (ZYLOPRIM) 100 MG tablet Take 100 mg by mouth daily.   Yes Historical Provider, MD  ALPRAZolam Duanne Moron) 1 MG tablet Take 1 mg by mouth 3 (three) times daily as needed for anxiety.  09/20/16  Yes Historical Provider, MD  calcium carbonate (OS-CAL) 600 MG TABS tablet Take 600 mg by mouth 2 (two) times daily.   Yes Historical Provider, MD  cetirizine  (ZYRTEC) 10 MG tablet Take 10 mg by mouth daily.     Yes Historical Provider, MD  CRESTOR 10 MG tablet Take 10 mg by mouth at bedtime.  12/02/13  Yes Historical Provider, MD  cyclobenzaprine (FLEXERIL) 10 MG tablet Take 10 mg by mouth at bedtime.    Yes Historical Provider, MD  HYDROcodone-acetaminophen (NORCO) 10-325 MG per tablet Take 1 tablet by mouth at bedtime.    Yes Historical Provider, MD  levothyroxine (SYNTHROID, LEVOTHROID) 50 MCG tablet Take 50 mcg by mouth every morning.    Yes Historical Provider, MD  Magnesium Chloride (MAG-DELAY) 535 (64 MG) MG TBCR Take 1 tablet by mouth at bedtime.    Yes Historical Provider, MD  metoprolol succinate (TOPROL-XL) 25 MG 24 hr tablet Take 25 mg by mouth daily.     Yes Historical Provider, MD  Multiple Vitamin (MULTIVITAMIN WITH MINERALS) TABS tablet Take 1 tablet by mouth daily.   Yes Historical Provider, MD  omeprazole ([place])  20 MG capsule Take 20 mg by mouth daily.  02/22/13  Yes Historical Provider, MD  ramipril (ALTACE) 2.5 MG capsule Take 2.5 mg by mouth daily.    Yes Historical Provider, MD  rOPINIRole (REQUIP) 3 MG tablet Take 6 mg by mouth at bedtime.    Yes Historical Provider, MD  temazepam (RESTORIL) 30 MG capsule Take 30 mg by mouth at bedtime as needed for sleep.    Yes Historical Provider, MD  torsemide (DEMADEX) 20 MG tablet TAKE TWO AND ONE-HALF TABLETS TWICE A DAY Patient taking differently: TAKE THREE TABLETS TWICE A DAY 07/11/16  Yes Satira Sark, MD  warfarin (COUMADIN) 2 MG tablet Take 2-4 mg by mouth every evening. Takes one tablet (2mg ) everyday besides on Sunday when she takes 2 tablets (4mg )   Yes Historical Provider, MD    Physical Exam: Vitals:   12/17/16 1939 12/17/16 1944 12/17/16 2021  BP:  108/72   Pulse:  93   Resp:  24   Temp:  98.2 F (36.8 C)   TempSrc:  Oral   SpO2:   98%  Weight: 88.5 kg (195 lb)    Height: 5\' 8"  (1.727 m)        Constitutional: NAD, calm, comfortable Vitals:   12/17/16 1939  12/17/16 1944 12/17/16 2021  BP:  108/72   Pulse:  93   Resp:  24   Temp:  98.2 F (36.8 C)   TempSrc:  Oral   SpO2:   98%  Weight: 88.5 kg (195 lb)    Height: 5\' 8"  (1.727 m)     Eyes: PERRL, lids and conjunctivae normal ENMT: Mucous membranes are moist. Posterior pharynx clear of any exudate or lesions.Normal dentition.  Neck: normal, supple, no masses, no thyromegaly Respiratory: Bilateral wheezing with mild bisilar rales bilaterally.  Normal respiratory effort. No accessory muscle use.  Cardiovascular: Regular rate and rhythm, no murmurs / rubs / gallops. No extremity edema. 2+ pedal pulses. No carotid bruits.  Abdomen: no tenderness, no masses palpated. No hepatosplenomegaly. Bowel sounds positive.  Musculoskeletal: no clubbing / cyanosis. No joint deformity upper and lower extremities. Good ROM, no contractures. Normal muscle tone.  Skin: no rashes, lesions, ulcers. No induration Neurologic: CN 2-12 grossly intact. Sensation intact, DTR normal. Strength 5/5 in all 4.  Psychiatric: Normal judgment and insight. Alert and oriented x 3. Normal mood.     Labs on Admission: I have personally reviewed following labs and imaging studies  CBC:  Recent Labs Lab 12/17/16 2006  WBC 9.4  NEUTROABS 6.6  HGB 9.9*  HCT 30.0*  MCV 89.6  PLT [id number]   Basic Metabolic Panel:  Recent Labs Lab 12/17/16 2006  NA 134*  K 3.3*  CL 100*  CO2 29  GLUCOSE 98  BUN 16  CREATININE 1.26*  CALCIUM 8.3*   Coagulation Profile:  Recent Labs Lab 12/17/16 2006  INR 2.16   Cardiac Enzymes:  Recent Labs Lab 12/17/16 2006  TROPONINI <0.03    Radiological Exams on Admission: Dg Chest 2 View  Result Date: 12/17/2016 CLINICAL DATA:  Productive cough. Shortness of breath. Chest pain secondary to cough. EXAM: CHEST  2 VIEW COMPARISON:  Radiographs 12/30/2013 FINDINGS: Post median sternotomy with prosthetic aortic valve. Unchanged heart size and mediastinal contours with mild cardiomegaly.  Diffuse prominent interstitial markings which were not seen on prior exam. No confluent airspace disease. No pleural fluid or pneumothorax. No acute osseous abnormalities. IMPRESSION: Prominent interstitial opacities which may be infectious or pulmonary edema.  Electronically Signed   By: Jeb Levering M.D.   On: 12/17/2016 22:03    EKG: Independently reviewed.  Assessment/Plan Principal Problem:   Acute on chronic diastolic CHF (congestive heart failure) (HCC) Active Problems:   Atrial fibrillation (HCC)   GERD   Aortic valve replaced   Long term current use of anticoagulant therapy   COPD exacerbation (HCC)    PLAN:   Acute on chronic diastolic CHF:  Will continue with her cardiac meds.  Change oral diuretics to IV Lasix at 40mg  BID. Follow Cr.  Since her last ECHO was in 2016, will repeat.   AVR mechanical valve:  Continue with coumadin per pharmacy dosing.    Afib:  Rate is controlled.  Continue with BB and she is anticoagulated.   COPD exacerbation:  Will start her on steroids, continue with nebs.  I suspect this is a signfiicant component of her SOB.   DVT prophylaxis: Coumadin.  Code Status: FULL CODE.  Family Communication: Husband at bedside.  Disposition Plan: Home when appropriate.  Consults called: None.  Admission status: FULL ADMISSION.    Geneieve Duell MD FACP. [facility]  If 7PM-7AM, please contact night-coverage [email or web address] Password [facility]  12/17/2016, 10:55 PM

## 2016-12-18 ENCOUNTER — Encounter (HOSPITAL_COMMUNITY): Payer: Self-pay | Admitting: Cardiology

## 2016-12-18 DIAGNOSIS — I482 Chronic atrial fibrillation: Secondary | ICD-10-CM

## 2016-12-18 DIAGNOSIS — J441 Chronic obstructive pulmonary disease with (acute) exacerbation: Principal | ICD-10-CM

## 2016-12-18 DIAGNOSIS — I5033 Acute on chronic diastolic (congestive) heart failure: Secondary | ICD-10-CM

## 2016-12-18 DIAGNOSIS — K219 Gastro-esophageal reflux disease without esophagitis: Secondary | ICD-10-CM

## 2016-12-18 LAB — CBC
HEMATOCRIT: 31.7 % — AB (ref 36.0–46.0)
HEMOGLOBIN: 10.3 g/dL — AB (ref 12.0–15.0)
MCH: 29.3 pg (ref 26.0–34.0)
MCHC: 32.5 g/dL (ref 30.0–36.0)
MCV: 90.1 fL (ref 78.0–100.0)
Platelets: 244 10*3/uL (ref 150–400)
RBC: 3.52 MIL/uL — ABNORMAL LOW (ref 3.87–5.11)
RDW: 15.4 % (ref 11.5–15.5)
WBC: 7.6 10*3/uL (ref 4.0–10.5)

## 2016-12-18 LAB — BASIC METABOLIC PANEL
ANION GAP: 11 (ref 5–15)
BUN: 17 mg/dL (ref 6–20)
CO2: 26 mmol/L (ref 22–32)
Calcium: 8.5 mg/dL — ABNORMAL LOW (ref 8.9–10.3)
Chloride: 99 mmol/L — ABNORMAL LOW (ref 101–111)
Creatinine, Ser: 1.26 mg/dL — ABNORMAL HIGH (ref 0.44–1.00)
GFR calc Af Amer: 51 mL/min — ABNORMAL LOW (ref >60)
GFR calc non Af Amer: 44 mL/min — ABNORMAL LOW (ref >60)
GLUCOSE: 170 mg/dL — AB (ref 65–99)
POTASSIUM: 3.5 mmol/L (ref 3.5–5.1)
Sodium: 136 mmol/L (ref 135–145)

## 2016-12-18 LAB — TSH: TSH: 1.135 u[IU]/mL (ref 0.350–4.500)

## 2016-12-18 LAB — PROTIME-INR
INR: 2.11
Prothrombin Time: 24 seconds — ABNORMAL HIGH (ref 11.4–15.2)

## 2016-12-18 MED ORDER — ROPINIROLE HCL 1 MG PO TABS
6.0000 mg | ORAL_TABLET | Freq: Every day | ORAL | Status: DC
  Administered 2016-12-18 – 2016-12-19 (×2): 6 mg via ORAL
  Filled 2016-12-18 (×2): qty 6

## 2016-12-18 MED ORDER — ALLOPURINOL 100 MG PO TABS
100.0000 mg | ORAL_TABLET | Freq: Every day | ORAL | Status: DC
  Administered 2016-12-18 – 2016-12-20 (×3): 100 mg via ORAL
  Filled 2016-12-18 (×3): qty 1

## 2016-12-18 MED ORDER — FUROSEMIDE 10 MG/ML IJ SOLN
40.0000 mg | Freq: Two times a day (BID) | INTRAMUSCULAR | Status: DC
  Administered 2016-12-18 – 2016-12-19 (×3): 40 mg via INTRAVENOUS
  Filled 2016-12-18 (×3): qty 4

## 2016-12-18 MED ORDER — HYDROCODONE-ACETAMINOPHEN 10-325 MG PO TABS
1.0000 | ORAL_TABLET | Freq: Every day | ORAL | Status: DC
  Administered 2016-12-18 – 2016-12-19 (×4): 1 via ORAL
  Filled 2016-12-18 (×4): qty 1

## 2016-12-18 MED ORDER — WARFARIN - PHARMACIST DOSING INPATIENT
Freq: Every day | Status: DC
  Administered 2016-12-18: 18:00:00

## 2016-12-18 MED ORDER — WARFARIN SODIUM 2 MG PO TABS
4.0000 mg | ORAL_TABLET | Freq: Once | ORAL | Status: AC
  Administered 2016-12-18: 4 mg via ORAL
  Filled 2016-12-18: qty 2

## 2016-12-18 MED ORDER — LEVOTHYROXINE SODIUM 50 MCG PO TABS
50.0000 ug | ORAL_TABLET | Freq: Every morning | ORAL | Status: DC
  Administered 2016-12-18 – 2016-12-20 (×3): 50 ug via ORAL
  Filled 2016-12-18 (×3): qty 1

## 2016-12-18 MED ORDER — METOPROLOL SUCCINATE ER 50 MG PO TB24
50.0000 mg | ORAL_TABLET | Freq: Every day | ORAL | Status: DC
  Administered 2016-12-19 – 2016-12-20 (×2): 50 mg via ORAL
  Filled 2016-12-18 (×2): qty 1

## 2016-12-18 MED ORDER — SODIUM CHLORIDE 0.9% FLUSH
3.0000 mL | Freq: Two times a day (BID) | INTRAVENOUS | Status: DC
  Administered 2016-12-18 – 2016-12-20 (×6): 3 mL via INTRAVENOUS

## 2016-12-18 MED ORDER — METHYLPREDNISOLONE SODIUM SUCC 40 MG IJ SOLR
40.0000 mg | Freq: Two times a day (BID) | INTRAMUSCULAR | Status: DC
  Administered 2016-12-18 – 2016-12-20 (×6): 40 mg via INTRAVENOUS
  Filled 2016-12-18 (×5): qty 1

## 2016-12-18 MED ORDER — RAMIPRIL 1.25 MG PO CAPS
2.5000 mg | ORAL_CAPSULE | Freq: Every day | ORAL | Status: DC
  Administered 2016-12-18: 2.5 mg via ORAL
  Filled 2016-12-18: qty 2

## 2016-12-18 MED ORDER — ALPRAZOLAM 1 MG PO TABS
1.0000 mg | ORAL_TABLET | Freq: Three times a day (TID) | ORAL | Status: DC | PRN
  Administered 2016-12-18 – 2016-12-20 (×5): 1 mg via ORAL
  Filled 2016-12-18 (×7): qty 1

## 2016-12-18 MED ORDER — METOPROLOL SUCCINATE ER 25 MG PO TB24
25.0000 mg | ORAL_TABLET | Freq: Every day | ORAL | Status: DC
  Administered 2016-12-18: 25 mg via ORAL
  Filled 2016-12-18: qty 1

## 2016-12-18 MED ORDER — PANTOPRAZOLE SODIUM 40 MG PO TBEC
40.0000 mg | DELAYED_RELEASE_TABLET | Freq: Every day | ORAL | Status: DC
  Administered 2016-12-18 – 2016-12-20 (×3): 40 mg via ORAL
  Filled 2016-12-18 (×3): qty 1

## 2016-12-18 MED ORDER — GUAIFENESIN-DM 100-10 MG/5ML PO SYRP
5.0000 mL | ORAL_SOLUTION | ORAL | Status: DC | PRN
  Administered 2016-12-18 – 2016-12-19 (×3): 5 mL via ORAL
  Filled 2016-12-18 (×3): qty 5

## 2016-12-18 MED ORDER — ROSUVASTATIN CALCIUM 10 MG PO TABS
10.0000 mg | ORAL_TABLET | Freq: Every day | ORAL | Status: DC
  Administered 2016-12-18 – 2016-12-19 (×2): 10 mg via ORAL
  Filled 2016-12-18 (×2): qty 1

## 2016-12-18 NOTE — Progress Notes (Signed)
ANTICOAGULATION CONSULT NOTE - Initial Consult  Pharmacy Consult for coumadin Indication: AVR, afib  Allergies  Allergen Reactions  . Penicillins Hives, Itching, Swelling and Rash    Has patient had a PCN reaction causing immediate rash, facial/tongue/throat swelling, SOB or lightheadedness with hypotension: Yes Has patient had a PCN reaction causing severe rash involving mucus membranes or skin necrosis: Yes Has patient had a PCN reaction that required hospitalization: unknown Has patient had a PCN reaction occurring within the last 10 years: No If all of the above answers are "NO", then may proceed with Cephalosporin use.    Throat swelling    Patient Measurements: Height: 5\' 8"  (172.7 cm) Weight: 201 lb (91.2 kg) IBW/kg (Calculated) : 63.9   Vital Signs: Temp: 98.2 F (36.8 C) (03/01 0025) Temp Source: Oral (03/01 0025) BP: 110/72 (03/01 0025) Pulse Rate: 93 (03/01 0025)  Labs:  Recent Labs  12/17/16 2006 12/18/16 0443  HGB 9.9* 10.3*  HCT 30.0* 31.7*  PLT 250 244  LABPROT 24.5* 24.0*  INR 2.16 2.11  CREATININE 1.26* 1.26*  TROPONINI <0.03  --     Estimated Creatinine Clearance: 52.6 mL/min (by C-G formula based on SCr of 1.26 mg/dL (H)).   Medical History: Past Medical History:  Diagnosis Date  . Anxiety disorder   . Aortic stenosis    Status post St. Jude mechanical AVR 2007  . Asthma   . Atrial fibrillation ([place])   . Carcinoid tumor of colon 2007  . Coronary atherosclerosis of native coronary artery    Status post CABG 2007  . GERD (gastroesophageal reflux disease)   . History of colonoscopy 2003   Dr. Gala Romney - normal  . Hyperlipidemia   . Macromastia   . Pedal edema    bilateral, chronic  . Peptic stricture of esophagus 10/04/2010   GE junction on last EGD by Dr. Gala Romney, benign biopsies  . RLS (restless legs syndrome)   . Schatzki's ring     Medications:  Prescriptions Prior to Admission  Medication Sig Dispense Refill Last Dose  .  albuterol (PROVENTIL HFA;VENTOLIN HFA) 108 (90 BASE) MCG/ACT inhaler Inhale 1-2 puffs into the lungs every 6 (six) hours as needed for wheezing or shortness of breath.    12/17/2016 at Unknown time  . allopurinol (ZYLOPRIM) 100 MG tablet Take 100 mg by mouth daily.   12/17/2016 at Unknown time  . ALPRAZolam (XANAX) 1 MG tablet Take 1 mg by mouth 3 (three) times daily as needed for anxiety.    12/17/2016 at Unknown time  . calcium carbonate (OS-CAL) 600 MG TABS tablet Take 600 mg by mouth 2 (two) times daily.   12/17/2016 at Unknown time  . cetirizine (ZYRTEC) 10 MG tablet Take 10 mg by mouth daily.     12/17/2016 at Unknown time  . CRESTOR 10 MG tablet Take 10 mg by mouth at bedtime.    12/17/2016 at Unknown time  . cyclobenzaprine (FLEXERIL) 10 MG tablet Take 10 mg by mouth at bedtime.    12/17/2016 at Unknown time  . HYDROcodone-acetaminophen (NORCO) 10-325 MG per tablet Take 1 tablet by mouth at bedtime.    12/17/2016 at Unknown time  . levothyroxine (SYNTHROID, LEVOTHROID) 50 MCG tablet Take 50 mcg by mouth every morning.    12/17/2016 at Unknown time  . Magnesium Chloride (MAG-DELAY) 535 (64 MG) MG TBCR Take 1 tablet by mouth at bedtime.    12/17/2016 at Unknown time  . metoprolol succinate (TOPROL-XL) 25 MG 24 hr tablet Take 25  mg by mouth daily.     12/17/2016 at Unknown time  . Multiple Vitamin (MULTIVITAMIN WITH MINERALS) TABS tablet Take 1 tablet by mouth daily.   12/17/2016 at Unknown time  . omeprazole (PRILOSEC) 20 MG capsule Take 20 mg by mouth daily.    12/17/2016 at Unknown time  . ramipril (ALTACE) 2.5 MG capsule Take 2.5 mg by mouth daily.    12/17/2016 at Unknown time  . rOPINIRole (REQUIP) 3 MG tablet Take 6 mg by mouth at bedtime.    12/17/2016 at Unknown time  . temazepam (RESTORIL) 30 MG capsule Take 30 mg by mouth at bedtime as needed for sleep.    12/16/2016 at Unknown time  . torsemide (DEMADEX) 20 MG tablet TAKE TWO AND ONE-HALF TABLETS TWICE A DAY (Patient taking differently: TAKE THREE  TABLETS TWICE A DAY) 450 tablet 3 12/17/2016 at Unknown time  . warfarin (COUMADIN) 2 MG tablet Take 2-4 mg by mouth every evening. Takes one tablet (2mg ) everyday besides on Sunday when she takes 2 tablets (4mg )   12/17/2016 at 1900    Assessment: 66 yo lady to continue coumadin for St Jude's AVR and afib.   Goal of Therapy:  INR 2.5-3   Plan:  Coumadin 4 mg po today Daily PT/INR Monitor for bleeding complications  Excell Seltzer [place] 12/18/2016,8:03 AM

## 2016-12-18 NOTE — Consult Note (Signed)
CARDIOLOGY CONSULT NOTE   Patient ID: CHELSE GUNNER MRN: [id number] DOB/AGE: 65-Nov-1952 65 y.o.  Admit Date: 12/17/2016 Referring Physician: [facility]- Dr. Truman Hayward Primary Physician: Octavio Graves, DO Consulting Cardiologist: Rozann Lesches MD Primary Cardiologist: Rozann Lesches MD Reason for Consultation: CHF  Clinical Summary Ms. Disantis is a 66 y.o.female with past medical history outlined below who presented to the ER after being told do so after being called by PCP. She reports several days of recurrent coughing and congestion. She has seen her PCP twice over the last 6 weeks for this. She has been treated with cough suppressants and recently had a CXR reportedly showing concern for CHF.   She states that she has been compliant with her medications, although does use salt on her fruit. States that she has had some leg swelling over the last week.  On arrival to ER, BP 108/72, HR 93, BNP 350, she was found to by hypokalemic with potassium 3.3, creatinine 1.26. Hgb 9.9, Hct 30.0. INR 2.11. Troponin 0.03. CXR revealed prominent interstitial opacities, possible infection or pulmonary edema. She was treated with albuterol and Atrovent and lasix 60 mg IV. No intake and output recorded.    Allergies  Allergen Reactions  . Penicillins Hives, Itching, Swelling and Rash    Has patient had a PCN reaction causing immediate rash, facial/tongue/throat swelling, SOB or lightheadedness with hypotension: Yes Has patient had a PCN reaction causing severe rash involving mucus membranes or skin necrosis: Yes Has patient had a PCN reaction that required hospitalization: unknown Has patient had a PCN reaction occurring within the last 10 years: No If all of the above answers are "NO", then may proceed with Cephalosporin use.    Throat swelling    Medications Scheduled Medications: . allopurinol  100 mg Oral Daily  . furosemide  40 mg Intravenous BID  . HYDROcodone-acetaminophen  1 tablet Oral  QHS  . levothyroxine  50 mcg Oral q morning - 10a  . methylPREDNISolone (SOLU-MEDROL) injection  40 mg Intravenous Q12H  . metoprolol succinate  25 mg Oral Daily  . pantoprazole  40 mg Oral Daily  . ramipril  2.5 mg Oral Daily  . rOPINIRole  6 mg Oral QHS  . rosuvastatin  10 mg Oral QHS  . sodium chloride flush  3 mL Intravenous Q12H  . warfarin  4 mg Oral Once  . Warfarin - Pharmacist Dosing Inpatient   Does not apply q1800     PRN Medications:  ALPRAZolam, guaiFENesin-dextromethorphan   Past Medical History:  Diagnosis Date  . Anxiety disorder   . Aortic stenosis    Status post St. Jude mechanical AVR 2007  . Asthma   . Atrial fibrillation ([place])   . Carcinoid tumor of colon 2007  . Coronary atherosclerosis of native coronary artery    Status post CABG 2007  . GERD (gastroesophageal reflux disease)   . History of colonoscopy 2003   Dr. Gala Romney - normal  . Hyperlipidemia   . Macromastia   . Pedal edema    bilateral, chronic  . Peptic stricture of esophagus 10/04/2010   GE junction on last EGD by Dr. Gala Romney, benign biopsies  . RLS (restless legs syndrome)   . Schatzki's ring     Past Surgical History:  Procedure Laterality Date  . ABDOMINAL HYSTERECTOMY    . AORTIC VALVE REPLACEMENT  2007   #25 mm St. 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 APPENDECTOMY  2007  .  BACK SURGERY     lumbar 4 and 5   . BIOPSY  02/05/2012   RMR:Two tongues of salmon-colored epithelium distal esophagus, very short-segment Barrett's s/p bx/Small hiatal hernia, otherwise normal stomach, D1, D2. Status post esophageal dilation. Biopsy showed GERD.  . Breast cyst removed     bilateral  . BREAST REDUCTION SURGERY    . CESAREAN SECTION    . 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 COLONOSCOPY  02/05/2012   [id number] rectum, sigmoid diverticulosis,descending colon polyp , tubular adenoma  . CORONARY ARTERY BYPASS GRAFT      06/2006 - RIMA to RCA, SVG to RCA  . ESOPHAGOGASTRODUODENOSCOPY  10/03/2010   Dr. Gala Romney- schatzki's ring, shoft peptic stricture at GE junction.  Marland Kitchen FOOT SURGERY     bilateral bunionectomy  . HERNIA REPAIR     with mesh  . LAPAROTOMY  2007   small bowel resection secondary to small bowel obstruction  . MALONEY DILATION  02/05/2012   Procedure: Venia Minks DILATION;  Surgeon: Daneil Dolin, MD;  Location: [facility];  Service: Endoscopy;  Laterality: N/A;  75mm   . Teeth removal      Family History  Problem Relation Age of Onset  . Stroke Mother   . Cancer Father   . Anesthesia problems Neg Hx   . Hypotension Neg Hx   . Malignant hyperthermia Neg Hx   . Pseudochol deficiency Neg Hx      Social History Ms. Eslinger reports that she quit smoking about 28 years ago. Her smoking use included Cigarettes. She has a 20.00 pack-year smoking history. She has never used smokeless tobacco. Ms. Stroup reports that she does not drink alcohol.  Review of Systems Complete review of systems are found to be negative unless outlined in H&P above. No fevers or chills.  Physical Examination Blood pressure 110/72, pulse 93, temperature 98.2 F (36.8 C), temperature source Oral, resp. rate 20, height 5\' 8"  (1.727 m), weight 201 lb (91.2 kg), SpO2 97 %. No intake or output data in the 24 hours ending 12/18/16 1002  Telemetry: Personally reviewed showing atrial fibrillation with uncontrolled rate, 100-120 bpm.  GEN: Obese woman in no distress. HEENT: Conjunctiva and lids normal, oropharynx clear. Neck: Supple, no carotid bruits, no thyromegaly. Lungs: Inspiratory wheezes in the upper airways, diminished in the left base. No coughing.  Cardiac: Irregular rate and rhythm, tachycardic, crisp click, no S3, no pericardial rub. Abdomen: Soft, nontender, bowel sounds present, no guarding or rebound. Extremities: 1+ leg edema, distal pulses 1+ with venous stasis skin changes bilaterally.  Skin: Warm and  dry. Musculoskeletal: No kyphosis. Neuropsychiatric: Alert and oriented x3, affect grossly appropriate.  Prior Cardiac Testing/Procedures  Echocardiogram 09/28/2014 Left ventricle: The cavity size was normal. Wall thickness was increased in a pattern of mild LVH. Systolic function was normal. The estimated ejection fraction was in the range of 60% to 65%. Wall motion was normal; there were no regional wall motion abnormalities. The study is not technically sufficient to allow evaluation of LV diastolic function. Rhythm during study appears to be atrial flutter with 2:1 and variable conduction. - Aortic valve: A St. Jude Medical mechanical prosthesis was present. There was no significant regurgitation. Mean gradient (S): 6 mm Hg. VTI ratio of LVOT to aortic valve: 0.58. - Mitral valve: Moderately calcified annulus. There was trivial regurgitation. - Left atrium: The atrium was mildly dilated. - Tricuspid valve: There was trivial regurgitation. - Pulmonary arteries: Systolic pressure could not  be accurately estimated. - Pericardium, extracardiac: A prominent pericardial fat pad was present. There was no pericardial effusion.  Lab Results  Basic Metabolic Panel:  Recent Labs Lab 12/17/16 2006 12/18/16 0443  NA 134* 136  K 3.3* 3.5  CL 100* 99*  CO2 29 26  GLUCOSE 98 170*  BUN 16 17  CREATININE 1.26* 1.26*  CALCIUM 8.3* 8.5*     CBC:  Recent Labs Lab 12/17/16 2006 12/18/16 0443  WBC 9.4 7.6  NEUTROABS 6.6  --   HGB 9.9* 10.3*  HCT 30.0* 31.7*  MCV 89.6 90.1  PLT 250 244    Cardiac Enzymes:  Recent Labs Lab 12/17/16 2006  TROPONINI <0.03    BNP: 354  Radiology: Dg Chest 2 View  Result Date: 12/17/2016 CLINICAL DATA:  Productive cough. Shortness of breath. Chest pain secondary to cough. EXAM: CHEST  2 VIEW COMPARISON:  Radiographs 12/30/2013 FINDINGS: Post median sternotomy with prosthetic aortic valve. Unchanged heart size and  mediastinal contours with mild cardiomegaly. Diffuse prominent interstitial markings which were not seen on prior exam. No confluent airspace disease. No pleural fluid or pneumothorax. No acute osseous abnormalities. IMPRESSION: Prominent interstitial opacities which may be infectious or pulmonary edema. Electronically Signed   By: Jeb Levering M.D.   On: 12/17/2016 22:03    ECG: Tracing from 12/17/2016 personally reviewed showing atrial fibrillation the 90s with nonspecific ST changes.  Impression and Recommendations  1. Acute on Chronic Diastolic CHF: Most recent echocardiogram was completed in 2015 with EF of 60-65%. Will repeat this. On metoprolol 25 mg XL for HR control. BP is soft. Also on ramipril 2.5 mg daily.   She is now on IV diureses 40 mg BID, uncertain urine output at this time as there has been no documentation. Will make sure she has daily wts and I/O. Strict I/O and daily wts. She is recommended to not add salt to food.  2. Atrial fib with RVR: Heart rate is not well controlled. Will increase metoprolol XL to 50 mg daily and discontinue the ACE. Coumadin per pharmacy. She is on albuterol which may also be contributing to her HR  Recommend changing to Xopenex. Repeat echo when HR is better controlled.   3. Questionable COPD component: Currently being worked up, on steroids.   Signed: Phill Myron. Lawrence NP [place]  12/18/2016, 10:02 AM Co-Sign MD   Attending note:  Patient seen and examined. Discussed the case with Ms. Lawrence NP and modified above note. Patient known to me, last seen in October 2017. She has been having trouble with recurring cough, shortness of breath, chest congestion, also leg swelling intermittently over the last several weeks. Cough suppressants were not overly helpful. She had a recent chest x-ray per PCP with concern for heart failure and was referred for hospitalization. She does not report any chest pain. Heart rate control of atrial fibrillation does  not look to be optimal which may also be contributing. She has had no fevers or chills.  On examination she appears comfortable. Heart rate in the 90s to low 100s in atrial fibrillation, systolic blood pressure [id number]. Afebrile. Lungs exhibit diminished breath sounds with end expiratory wheezes and scattered crackles at the bases. Cardiac exam reveals irregularly irregular rhythm with crisp valve click in S2. Legs with 1+ edema. Lab work shows creatinine 1.26, potassium 3.5, hemoglobin 10.3, INR 2.1.  Patient presents with evidence of volume overload, acute on chronic diastolic heart failure suspected, possibly also contributed to by elevated heart rate in  atrial fibrillation and potentially COPD exacerbation. Transitioning her to IV Lasix for now, also increase Toprol-XL to 50 mg daily for better heart rate control, hold Altace. Follow up on echocardiogram for repeat cardiac structural assessment.  Satira Sark, M.D., F.A.C.C.

## 2016-12-18 NOTE — Care Management Note (Signed)
Case Management Note  Patient Details  Name: [name] MRN: [id number] Date of Birth: 24-May-1951  Subjective/Objective:   Patient adm from home with acute on chronic CHF. She if from home with husband, ind with ADL's. Has PCP, still drives herself to appointments. Reports no issues with affording medications. She has inhaler and CPAP at home, does not wear CPAP. No oxygen/HH PTA.                  Action/Plan: Anticipate DC home with self care.   Expected Discharge Date:     12/19/2016             Expected Discharge Plan:  Home/Self Care  In-House Referral:     Discharge planning Services  CM Consult  Post Acute Care Choice:  NA Choice offered to:  NA  DME Arranged:    DME Agency:     HH Arranged:    HH Agency:     Status of Service:  Completed, signed off  If discussed at [employer] of Stay Meetings, dates discussed:    Additional Comments:  Marleen Moret, Chauncey Reading, RN 12/18/2016, 2:36 PM

## 2016-12-18 NOTE — Progress Notes (Signed)
PROGRESS NOTE    [name]  [id number]  DOB: [birth year]  DOA: 12/17/2016 PCP: Octavio Graves, DO Outpatient Specialists:   Hospital course: [name] is an 66 y.o. 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  Evaluation in the ER included a BNP 350. CXR with edema vs infiltrate, and EKG showed afib with rate controlled and no ischemic changes, Hb of 9 g per dL, K of 3.3 mm/L, and Cr of 1.26.  She was given Neb and felt better.  She was also given IV lasix 60mg  just now, and hospitalist was asked to admit her for acute on chronic diastolic CHF.  She has no chest pian and her troponin was negaitve.   Assessment & Plan:   Acute on chronic diastolic CHF:  Will continue with her cardiac meds.  Change oral diuretics to IV Lasix at 40mg  BID. Follow Cr.  Since her last ECHO was in 2016, will repeat.   AVR mechanical valve:  Continue with coumadin per pharmacy dosing.    Afib with RVR:  Cardiology increasing her metoprolol - see notes, follow on teletry. Checking echocardiogram.  Diuresing.   COPD exacerbation:  Will start her on steroids, continue with nebs.     DVT prophylaxis: Coumadin.  Code Status: FULL CODE.  Family Communication: Husband at bedside.  Disposition Plan: Home when appropriate.  Consults called: None.  Admission status: FULL ADMISSION.   Consultants:  cardiology   Subjective: [name] is reporting that she does feel some better this morning.  She is diuresing and less swelling in the legs.    Objective: Vitals:   12/17/16 2112 12/17/16 2335 12/18/16 0025 12/18/16 1155  BP: 107/74 98/56 110/72 114/77  Pulse: 96 95 93 100  Resp: (!) 30 18 20 20   Temp:   98.2 F (36.8 C) 98.5 F (36.9 C)  TempSrc:   Oral Oral  SpO2:  100% 93% 97% 95%  Weight:    91.1 kg (200 lb 14.4 oz)  Height:        Intake/Output Summary (Last 24 hours) at 12/18/16 1344 Last data filed at 12/18/16 1201  Gross per 24 hour  Intake              240 ml  Output              400 ml  Net             -160 ml   Filed Weights   12/17/16 1939 12/18/16 1155  Weight: 88.5 kg (195 lb) 91.1 kg (200 lb 14.4 oz)    Exam:  General exam: awake, alert, NAD.  Respiratory system:  No increased work of breathing. Cardiovascular system: S1 & S2 heard, RRR. No JVD. Gastrointestinal system: Abdomen is nondistended, soft and nontender. Normal bowel sounds heard. Central nervous system: Alert and oriented. No focal neurological deficits. Extremities: no cyanosis, Bilateral pretibial edema.  Data Reviewed: Basic Metabolic Panel:  Recent Labs Lab 12/17/16 2006 12/18/16 0443  NA 134* 136  K 3.3* 3.5  CL 100* 99*  CO2 29 26  GLUCOSE 98 170*  BUN 16 17  CREATININE 1.26* 1.26*  CALCIUM 8.3* 8.5*   Liver Function Tests: No results for input(s): AST, ALT, ALKPHOS, BILITOT, PROT, ALBUMIN in the last  168 hours. No results for input(s): LIPASE, AMYLASE in the last 168 hours. No results for input(s): AMMONIA in the last 168 hours. CBC:  Recent Labs Lab 12/17/16 2006 12/18/16 0443  WBC 9.4 7.6  NEUTROABS 6.6  --   HGB 9.9* 10.3*  HCT 30.0* 31.7*  MCV 89.6 90.1  PLT 250 244   Cardiac Enzymes:  Recent Labs Lab 12/17/16 2006  TROPONINI <0.03   CBG (last 3)  No results for input(s): GLUCAP in the last 72 hours. No results found for this or any previous visit (from the past 240 hour(s)).   Studies: Dg Chest 2 View  Result Date: 12/17/2016 CLINICAL DATA:  Productive cough. Shortness of breath. Chest pain secondary to cough. EXAM: CHEST  2 VIEW COMPARISON:  Radiographs 12/30/2013 FINDINGS: Post median sternotomy with prosthetic aortic valve. Unchanged heart size and mediastinal contours with mild cardiomegaly. Diffuse prominent  interstitial markings which were not seen on prior exam. No confluent airspace disease. No pleural fluid or pneumothorax. No acute osseous abnormalities. IMPRESSION: Prominent interstitial opacities which may be infectious or pulmonary edema. Electronically Signed   By: Jeb Levering M.D.   On: 12/17/2016 22:03   Scheduled Meds: . allopurinol  100 mg Oral Daily  . furosemide  40 mg Intravenous BID  . HYDROcodone-acetaminophen  1 tablet Oral QHS  . levothyroxine  50 mcg Oral q morning - 10a  . methylPREDNISolone (SOLU-MEDROL) injection  40 mg Intravenous Q12H  . [START ON 12/19/2016] metoprolol succinate  50 mg Oral Daily  . pantoprazole  40 mg Oral Daily  . rOPINIRole  6 mg Oral QHS  . rosuvastatin  10 mg Oral QHS  . sodium chloride flush  3 mL Intravenous Q12H  . Warfarin - Pharmacist Dosing Inpatient   Does not apply q1800   Continuous Infusions:  Principal Problem:   Acute on chronic diastolic CHF (congestive heart failure) ([facility]) Active Problems:   Atrial fibrillation ([facility])   GERD   Aortic valve replaced   Long term current use of anticoagulant therapy   COPD exacerbation ([place])  Time spent:   Irwin Brakeman, MD, FAAFP [facility] Pager [phone]  If 7PM-7AM, please contact night-coverage [email or web address] Password [email or web address] 12/18/2016, 1:44 PM    LOS: 1 day

## 2016-12-19 ENCOUNTER — Inpatient Hospital Stay (HOSPITAL_COMMUNITY): Payer: Medicare HMO

## 2016-12-19 DIAGNOSIS — Z7901 Long term (current) use of anticoagulants: Secondary | ICD-10-CM

## 2016-12-19 DIAGNOSIS — Z952 Presence of prosthetic heart valve: Secondary | ICD-10-CM

## 2016-12-19 DIAGNOSIS — I509 Heart failure, unspecified: Secondary | ICD-10-CM

## 2016-12-19 LAB — ECHOCARDIOGRAM COMPLETE
AOVTI: 34.7 cm
AV Peak grad: 18 mmHg
AV VEL mean LVOT/AV: 0.37
AVG: 9 mmHg
AVLVOTPG: 2 mmHg
AVPKVEL: 214 cm/s
Ao pk vel: 0.35 m/s
CHL CUP DOP CALC LVOT VTI: 14.9 cm
DOP CAL AO MEAN VELOCITY: 134 cm/s
E decel time: 190 msec
E/e' ratio: 16.65
FS: 42 % (ref 28–44)
HEIGHTINCHES: 68 in
IV/PV OW: 0.98
LA ID, A-P, ES: 69 mm
LA vol A4C: 98.4 ml
LA vol index: 45.2 mL/m2
LA vol: 95.6 mL
LADIAMINDEX: 3.26 cm/m2
LEFT ATRIUM END SYS DIAM: 69 mm
LV E/e'average: 16.65
LV PW d: 12.4 mm — AB (ref 0.6–1.1)
LV TDI E'LATERAL: 9.79
LV TDI E'MEDIAL: 6.53
LV e' LATERAL: 9.79 cm/s
LVEEMED: 16.65
LVOT peak VTI: 0.43 cm
LVOT peak vel: 74.7 cm/s
MV Dec: 190
MV pk A vel: 36.3 m/s
MVPG: 11 mmHg
MVPKEVEL: 163 m/s
RV LATERAL S' VELOCITY: 7.58 cm/s
TAPSE: 15.3 mm
Weight: 3214.4 oz

## 2016-12-19 LAB — BASIC METABOLIC PANEL
Anion gap: 8 (ref 5–15)
BUN: 27 mg/dL — AB (ref 6–20)
CALCIUM: 8.7 mg/dL — AB (ref 8.9–10.3)
CHLORIDE: 100 mmol/L — AB (ref 101–111)
CO2: 28 mmol/L (ref 22–32)
CREATININE: 1.31 mg/dL — AB (ref 0.44–1.00)
GFR calc Af Amer: 48 mL/min — ABNORMAL LOW (ref >60)
GFR calc non Af Amer: 42 mL/min — ABNORMAL LOW (ref >60)
GLUCOSE: 126 mg/dL — AB (ref 65–99)
Potassium: 3.7 mmol/L (ref 3.5–5.1)
Sodium: 136 mmol/L (ref 135–145)

## 2016-12-19 LAB — CBC
HCT: 31.9 % — ABNORMAL LOW (ref 36.0–46.0)
Hemoglobin: 10.4 g/dL — ABNORMAL LOW (ref 12.0–15.0)
MCH: 29.2 pg (ref 26.0–34.0)
MCHC: 32.6 g/dL (ref 30.0–36.0)
MCV: 89.6 fL (ref 78.0–100.0)
PLATELETS: 292 10*3/uL (ref 150–400)
RBC: 3.56 MIL/uL — ABNORMAL LOW (ref 3.87–5.11)
RDW: 15.2 % (ref 11.5–15.5)
WBC: 13.2 10*3/uL — ABNORMAL HIGH (ref 4.0–10.5)

## 2016-12-19 LAB — PROTIME-INR
INR: 2.92
PROTHROMBIN TIME: 31.1 s — AB (ref 11.4–15.2)

## 2016-12-19 MED ORDER — WARFARIN SODIUM 2 MG PO TABS
2.0000 mg | ORAL_TABLET | Freq: Once | ORAL | Status: AC
  Administered 2016-12-19: 2 mg via ORAL
  Filled 2016-12-19: qty 1

## 2016-12-19 MED ORDER — FUROSEMIDE 10 MG/ML IJ SOLN
40.0000 mg | Freq: Every day | INTRAMUSCULAR | Status: DC
  Administered 2016-12-20: 40 mg via INTRAVENOUS
  Filled 2016-12-19: qty 4

## 2016-12-19 MED ORDER — TEMAZEPAM 15 MG PO CAPS
30.0000 mg | ORAL_CAPSULE | Freq: Every evening | ORAL | Status: DC | PRN
  Administered 2016-12-19: 30 mg via ORAL
  Filled 2016-12-19: qty 2

## 2016-12-19 NOTE — Evaluation (Signed)
Physical Therapy Evaluation Patient Details Name: [name] MRN: [id number] DOB: 19-Oct-1951 Today's Date: 12/19/2016   History of Present Illness  66 y.o. 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  Evaluation in the ER included a BNP 350. CXR with edema vs infiltrate, and EKG showed afib with rate controlled and no ischemic changes, Hb of 9 g per dL, K of 3.3 mm/L, and Cr of 1.26.  She was given Neb and felt better.  She was also given IV lasix 60mg  just now, and hospitalist was asked to admit her for acute on chronic diastolic CHF    Clinical Impression  Pt received in bed, husband present, and pt is agreeable to PT evaluation.  Pt is normally very independent with home and community ambulation.  She is independent with bathing and dressing.  She demonstrated high level of mobility during PT evaluation today as she quickly ambulated into the bathroom independently, stood at the sink for >5 min to fix her hair, and even crawled into the bed in quadruped position to unplug her cell phone.  She then ambulated 10ft with no device independently.  She does not demonstrate need for further acute skilled PT, or any follow up PT.  Therefore, PT orders will be discontinued at this time.      Follow Up Recommendations No PT follow up    Equipment Recommendations  None recommended by PT    Recommendations for Other Services       Precautions / Restrictions Precautions Precautions: None Restrictions Weight Bearing Restrictions: No      Mobility  Bed Mobility Overal bed mobility: Independent                Transfers Overall transfer level: Independent                  Ambulation/Gait Ambulation/Gait assistance: Independent Ambulation Distance  (Feet): 80 Feet Assistive device: None Gait Pattern/deviations: WFL(Within Functional Limits)        Stairs            Wheelchair Mobility    Modified Rankin (Stroke Patients Only)       Balance Overall balance assessment: Independent                                           Pertinent Vitals/Pain Pain Assessment: No/denies pain    Home Living   Living Arrangements: Spouse/significant other   Type of Home: Mobile home Home Access: Stairs to enter   Entrance Stairs-Number of Steps: 5-7 Home Layout: One level        Prior Function Level of Independence: Independent               Hand Dominance        Extremity/Trunk Assessment   Upper Extremity Assessment Upper Extremity Assessment: Overall WFL for tasks assessed    Lower Extremity Assessment Lower Extremity Assessment: Overall WFL for tasks assessed       Communication   Communication: No difficulties  Cognition Arousal/Alertness: Awake/alert Behavior During Therapy: WFL for tasks assessed/performed Overall Cognitive Status: Within Functional Limits for tasks assessed  General Comments      Exercises     Assessment/Plan    PT Assessment Patent does not need any further PT services  PT Problem List         PT Treatment Interventions      PT Goals (Current goals can be found in the Care Plan section)  Acute Rehab PT Goals Patient Stated Goal: Pt wants to go home PT Goal Formulation: All assessment and education complete, DC therapy    Frequency     Barriers to discharge        Co-evaluation               End of Session   Activity Tolerance: Patient tolerated treatment well Patient left: in chair Nurse Communication: Mobility status (mobility sheet left hanging in the room. ) PT Visit Diagnosis: Muscle weakness (generalized) (M62.81)    Functional Assessment Tool Used: AM-PAC 6 Clicks Basic Mobility;Clinical  judgement Functional Limitation: Mobility: Walking and moving around Mobility: Walking and Moving Around Current Status [phone]): 0 percent impaired, limited or restricted Mobility: Walking and Moving Around Goal Status [phone]): 0 percent impaired, limited or restricted Mobility: Walking and Moving Around Discharge Status [phone]): 0 percent impaired, limited or restricted    Time: 1145-1200 PT Time Calculation (min) (ACUTE ONLY): 15 min   Charges:   PT Evaluation $PT Eval Low Complexity: 1 Procedure     PT G Codes:   PT G-Codes **NOT FOR INPATIENT CLASS** Functional Assessment Tool Used: AM-PAC 6 Clicks Basic Mobility;Clinical judgement Functional Limitation: Mobility: Walking and moving around Mobility: Walking and Moving Around Current Status [id number]): 0 percent impaired, limited or restricted Mobility: Walking and Moving Around Goal Status [id number]): 0 percent impaired, limited or restricted Mobility: Walking and Moving Around Discharge Status [id number]): 0 percent impaired, limited or restricted     Beth Lilyauna Miedema, PT, DPT X: [id number]

## 2016-12-19 NOTE — Progress Notes (Signed)
PROGRESS NOTE    [name]  [id number]  DOB: 1951-05-24  DOA: 12/17/2016 PCP: Octavio Graves, DO Outpatient Specialists:   Hospital course: [name] is an 66 y.o. 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  Evaluation in the ER included a BNP 350. CXR with edema vs infiltrate, and EKG showed afib with rate controlled and no ischemic changes, Hb of 9 g per dL, K of 3.3 mm/L, and Cr of 1.26.  She was given Neb and felt better.  She was also given IV lasix 60mg  just now, and hospitalist was asked to admit her for acute on chronic diastolic CHF.  She has no chest pian and her troponin was negaitve.   Assessment & Plan:   Acute on chronic diastolic CHF:  Continue with her cardiac meds.  She is diuresing now down 1.8L.  Continue IV Lasix at 40mg  BID. Follow Cr.  Since her last ECHO was in 2016, will repeat and still pending.   AVR mechanical valve:  Continue with coumadin per pharmacy dosing.    Afib with RVR:  Cardiology increased her metoprolol - see notes, follow on teletry. Checking echocardiogram.  Diuresing.   COPD exacerbation:   on steroids, continue with nebs.     DVT prophylaxis: Coumadin.  Code Status: FULL CODE.  Family Communication: Husband at bedside.  Disposition Plan: Home tomorrow morning  Consults called: None.  Admission status: FULL ADMISSION.   Consultants:  cardiology   Subjective: She is diuresing and less swelling in the legs.    Objective: Vitals:   12/18/16 1155 12/18/16 1436 12/18/16 2215 12/19/16 0536  BP: 114/77 108/70 102/72 97/64  Pulse: 100 93  83  Resp: 20 18 18 20   Temp: 98.5 F (36.9 C) 98 F (36.7 C) 97 F (36.1 C) 98.2 F (36.8 C)  TempSrc: Oral Oral  Oral  SpO2: 95% 97% 95% 99%    Weight: 91.1 kg (200 lb 14.4 oz)     Height:        Intake/Output Summary (Last 24 hours) at 12/19/16 1003 Last data filed at 12/19/16 0537  Gross per 24 hour  Intake              240 ml  Output             2300 ml  Net            -2060 ml   Filed Weights   12/17/16 1939 12/18/16 1155  Weight: 88.5 kg (195 lb) 91.1 kg (200 lb 14.4 oz)    Exam:  General exam: awake, alert, NAD.  Respiratory system:  No increased work of breathing. Cardiovascular system: S1 & S2 heard, RRR. No JVD. Gastrointestinal system: Abdomen is nondistended, soft and nontender. Normal bowel sounds heard. Central nervous system: Alert and oriented. No focal neurological deficits. Extremities: no cyanosis, Bilateral pretibial edema.  Data Reviewed: Basic Metabolic Panel:  Recent Labs Lab 12/17/16 2006 12/18/16 0443 12/19/16 0555  NA 134* 136 136  K 3.3* 3.5 3.7  CL 100* 99* 100*  CO2 29 26 28   GLUCOSE 98 170* 126*  BUN 16 17 27*  CREATININE 1.26* 1.26* 1.31*  CALCIUM 8.3* 8.5* 8.7*   Liver Function Tests: No results for input(s): AST, ALT, ALKPHOS,  BILITOT, PROT, ALBUMIN in the last 168 hours. No results for input(s): LIPASE, AMYLASE in the last 168 hours. No results for input(s): AMMONIA in the last 168 hours. CBC:  Recent Labs Lab 12/17/16 2006 12/18/16 0443 12/19/16 0555  WBC 9.4 7.6 13.2*  NEUTROABS 6.6  --   --   HGB 9.9* 10.3* 10.4*  HCT 30.0* 31.7* 31.9*  MCV 89.6 90.1 89.6  PLT 250 244 292   Cardiac Enzymes:  Recent Labs Lab 12/17/16 2006  TROPONINI <0.03   CBG (last 3)  No results for input(s): GLUCAP in the last 72 hours. No results found for this or any previous visit (from the past 240 hour(s)).   Studies: Dg Chest 2 View  Result Date: 12/17/2016 CLINICAL DATA:  Productive cough. Shortness of breath. Chest pain secondary to cough. EXAM: CHEST  2 VIEW COMPARISON:  Radiographs 12/30/2013 FINDINGS: Post median sternotomy with prosthetic aortic valve. Unchanged  heart size and mediastinal contours with mild cardiomegaly. Diffuse prominent interstitial markings which were not seen on prior exam. No confluent airspace disease. No pleural fluid or pneumothorax. No acute osseous abnormalities. IMPRESSION: Prominent interstitial opacities which may be infectious or pulmonary edema. Electronically Signed   By: Jeb Levering M.D.   On: 12/17/2016 22:03   Dg Chest Port 1 View  Result Date: 12/19/2016 CLINICAL DATA:  Congestive heart failure EXAM: PORTABLE CHEST 1 VIEW COMPARISON:  12/17/2016 FINDINGS: Cardiomegaly again noted. Status post aortic valve replacement and median sternotomy. Persistent mild interstitial prominence bilaterally. Mild pneumonitis or interstitial edema cannot be excluded. No significant vascular congestion. No segmental infiltrate. Mild basilar atelectasis. IMPRESSION: Persistent mild interstitial prominence bilaterally. Mild pneumonitis or interstitial edema cannot be excluded. No significant vascular congestion. No segmental infiltrate. Mild basilar atelectasis. Electronically Signed   By: Lahoma Crocker M.D.   On: 12/19/2016 09:55   Scheduled Meds: . allopurinol  100 mg Oral Daily  . [START ON 12/20/2016] furosemide  40 mg Intravenous Daily  . HYDROcodone-acetaminophen  1 tablet Oral QHS  . levothyroxine  50 mcg Oral q morning - 10a  . methylPREDNISolone (SOLU-MEDROL) injection  40 mg Intravenous Q12H  . metoprolol succinate  50 mg Oral Daily  . pantoprazole  40 mg Oral Daily  . rOPINIRole  6 mg Oral QHS  . rosuvastatin  10 mg Oral QHS  . sodium chloride flush  3 mL Intravenous Q12H  . warfarin  2 mg Oral Once  . Warfarin - Pharmacist Dosing Inpatient   Does not apply q1800   Continuous Infusions:  Principal Problem:   Acute on chronic diastolic CHF (congestive heart failure) (HCC) Active Problems:   Atrial fibrillation (HCC)   GERD   Aortic valve replaced   Long term current use of anticoagulant therapy   COPD exacerbation  ([place])  Time spent:   Irwin Brakeman, MD, FAAFP [facility] Pager [phone]  If 7PM-7AM, please contact night-coverage [email or web address] Password [email or web address] 12/19/2016, 10:03 AM    LOS: 2 days

## 2016-12-19 NOTE — Progress Notes (Signed)
ANTICOAGULATION CONSULT NOTE -  Pharmacy Consult for coumadin Indication: AVR, afib  Allergies  Allergen Reactions  . Penicillins Hives, Itching, Swelling and Rash    Has patient had a PCN reaction causing immediate rash, facial/tongue/throat swelling, SOB or lightheadedness with hypotension: Yes Has patient had a PCN reaction causing severe rash involving mucus membranes or skin necrosis: Yes Has patient had a PCN reaction that required hospitalization: unknown Has patient had a PCN reaction occurring within the last 10 years: No If all of the above answers are "NO", then may proceed with Cephalosporin use.    Throat swelling    Patient Measurements: Height: 5\' 8"  (172.7 cm) Weight: 200 lb 14.4 oz (91.1 kg) IBW/kg (Calculated) : 63.9   Vital Signs: Temp: 98.2 F (36.8 C) (03/02 0536) Temp Source: Oral (03/02 0536) BP: 97/64 (03/02 0536) Pulse Rate: 83 (03/02 0536)  Labs:  Recent Labs  12/17/16 2006 12/18/16 0443 12/19/16 0555  HGB 9.9* 10.3* 10.4*  HCT 30.0* 31.7* 31.9*  PLT 250 244 292  LABPROT 24.5* 24.0* 31.1*  INR 2.16 2.11 2.92  CREATININE 1.26* 1.26* 1.31*  TROPONINI <0.03  --   --     Estimated Creatinine Clearance: 50.6 mL/min (by C-G formula based on SCr of 1.31 mg/dL (H)).   Medical History: Past Medical History:  Diagnosis Date  . Anxiety disorder   . Aortic stenosis    Status post St. Jude mechanical AVR 2007  . Asthma   . Atrial fibrillation ([place])   . Carcinoid tumor of colon 2007  . Coronary atherosclerosis of native coronary artery    Status post CABG 2007  . GERD (gastroesophageal reflux disease)   . History of colonoscopy 2003   Dr. Gala Romney - normal  . Hyperlipidemia   . Macromastia   . Pedal edema    Bilateral, chronic  . Peptic stricture of esophagus 10/04/2010   GE junction on last EGD by Dr. Gala Romney, benign biopsies  . RLS (restless legs syndrome)   . Schatzki's ring     Medications:  Prescriptions Prior to Admission   Medication Sig Dispense Refill Last Dose  . albuterol (PROVENTIL HFA;VENTOLIN HFA) 108 (90 BASE) MCG/ACT inhaler Inhale 1-2 puffs into the lungs every 6 (six) hours as needed for wheezing or shortness of breath.    12/17/2016 at Unknown time  . allopurinol (ZYLOPRIM) 100 MG tablet Take 100 mg by mouth daily.   12/17/2016 at Unknown time  . ALPRAZolam (XANAX) 1 MG tablet Take 1 mg by mouth 3 (three) times daily as needed for anxiety.    12/17/2016 at Unknown time  . calcium carbonate (OS-CAL) 600 MG TABS tablet Take 600 mg by mouth 2 (two) times daily.   12/17/2016 at Unknown time  . cetirizine (ZYRTEC) 10 MG tablet Take 10 mg by mouth daily.     12/17/2016 at Unknown time  . CRESTOR 10 MG tablet Take 10 mg by mouth at bedtime.    12/17/2016 at Unknown time  . cyclobenzaprine (FLEXERIL) 10 MG tablet Take 10 mg by mouth at bedtime.    12/17/2016 at Unknown time  . HYDROcodone-acetaminophen (NORCO) 10-325 MG per tablet Take 1 tablet by mouth at bedtime.    12/17/2016 at Unknown time  . levothyroxine (SYNTHROID, LEVOTHROID) 50 MCG tablet Take 50 mcg by mouth every morning.    12/17/2016 at Unknown time  . Magnesium Chloride (MAG-DELAY) 535 (64 MG) MG TBCR Take 1 tablet by mouth at bedtime.    12/17/2016 at Unknown time  .  metoprolol succinate (TOPROL-XL) 25 MG 24 hr tablet Take 25 mg by mouth daily.     12/17/2016 at Unknown time  . Multiple Vitamin (MULTIVITAMIN WITH MINERALS) TABS tablet Take 1 tablet by mouth daily.   12/17/2016 at Unknown time  . omeprazole (PRILOSEC) 20 MG capsule Take 20 mg by mouth daily.    12/17/2016 at Unknown time  . ramipril (ALTACE) 2.5 MG capsule Take 2.5 mg by mouth daily.    12/17/2016 at Unknown time  . rOPINIRole (REQUIP) 3 MG tablet Take 6 mg by mouth at bedtime.    12/17/2016 at Unknown time  . temazepam (RESTORIL) 30 MG capsule Take 30 mg by mouth at bedtime as needed for sleep.    12/16/2016 at Unknown time  . torsemide (DEMADEX) 20 MG tablet TAKE TWO AND ONE-HALF TABLETS TWICE  A DAY (Patient taking differently: TAKE THREE TABLETS TWICE A DAY) 450 tablet 3 12/17/2016 at Unknown time  . warfarin (COUMADIN) 2 MG tablet Take 2-4 mg by mouth every evening. Takes one tablet (2mg ) everyday besides on Sunday when she takes 2 tablets (4mg )   12/17/2016 at 1900    Assessment: 66 yo lady to continue coumadin for St Jude's AVR and afib.  Pt is followed in anti-coag clinic. INR 2.9 today. Patient takes 2mg  daily except 4mg  on Sunday.   Goal of Therapy:  INR 2-3   Plan:  Coumadin 2 mg po today Daily PT/INR Monitor for bleeding complications  Isac Sarna, BS Vena [place], BCPS Clinical Pharmacist Pager [phone] 12/19/2016,9:27 AM

## 2016-12-19 NOTE — Progress Notes (Signed)
*  PRELIMINARY RESULTS* Echocardiogram 2D Echocardiogram has been performed.  [name] 12/19/2016, 11:42 AM

## 2016-12-19 NOTE — Care Management Important Message (Signed)
Important Message  Patient Details  Name: [name] MRN: [id number] Date of Birth: Oct 14, 1951   Medicare Important Message Given:  Yes    Joyclyn Plazola, Chauncey Reading, RN 12/19/2016, 11:05 AM

## 2016-12-19 NOTE — Progress Notes (Signed)
Progress Note  Patient Name: [name] Date of Encounter: 12/19/2016  Primary Cardiologist: Rozann Lesches MD  Subjective   Breathing better, coughing less. No palpitations.   Inpatient Medications    Scheduled Meds: . allopurinol  100 mg Oral Daily  . [START ON 12/20/2016] furosemide  40 mg Intravenous Daily  . HYDROcodone-acetaminophen  1 tablet Oral QHS  . levothyroxine  50 mcg Oral q morning - 10a  . methylPREDNISolone (SOLU-MEDROL) injection  40 mg Intravenous Q12H  . metoprolol succinate  50 mg Oral Daily  . pantoprazole  40 mg Oral Daily  . rOPINIRole  6 mg Oral QHS  . rosuvastatin  10 mg Oral QHS  . sodium chloride flush  3 mL Intravenous Q12H  . warfarin  2 mg Oral Once  . Warfarin - Pharmacist Dosing Inpatient   Does not apply q1800    PRN Meds: ALPRAZolam, guaiFENesin-dextromethorphan, temazepam   Vital Signs    Vitals:   12/18/16 1155 12/18/16 1436 12/18/16 2215 12/19/16 0536  BP: 114/77 108/70 102/72 97/64  Pulse: 100 93  83  Resp: 20 18 18 20   Temp: 98.5 F (36.9 C) 98 F (36.7 C) 97 F (36.1 C) 98.2 F (36.8 C)  TempSrc: Oral Oral  Oral  SpO2: 95% 97% 95% 99%  Weight: 200 lb 14.4 oz (91.1 kg)     Height:        Intake/Output Summary (Last 24 hours) at 12/19/16 0955 Last data filed at 12/19/16 0537  Gross per 24 hour  Intake              240 ml  Output             2300 ml  Net            -2060 ml   Filed Weights   12/17/16 1939 12/18/16 1155  Weight: 195 lb (88.5 kg) 200 lb 14.4 oz (91.1 kg)    Telemetry    Atrial fibrillation, rates in the 80-100 range. Personally reviewed..   Physical Exam   GEN: No acute distress.   Neck: No JVD Cardiac: IRRR, crisp click,  no gallop.  Respiratory: Scant crackles in the right base. No active coughing.  GI: Soft, nontender, non-distended  MS: Non-pitting edema; venous stasis skin changes with dermatitis in the LEE No deformity.  Labs    Chemistry Recent Labs Lab 12/17/16 2006  12/18/16 0443 12/19/16 0555  NA 134* 136 136  K 3.3* 3.5 3.7  CL 100* 99* 100*  CO2 29 26 28   GLUCOSE 98 170* 126*  BUN 16 17 27*  CREATININE 1.26* 1.26* 1.31*  CALCIUM 8.3* 8.5* 8.7*  GFRNONAA 44* 44* 42*  GFRAA 51* 51* 48*  ANIONGAP 5 11 8      Hematology Recent Labs Lab 12/17/16 2006 12/18/16 0443 12/19/16 0555  WBC 9.4 7.6 13.2*  RBC 3.35* 3.52* 3.56*  HGB 9.9* 10.3* 10.4*  HCT 30.0* 31.7* 31.9*  MCV 89.6 90.1 89.6  MCH 29.6 29.3 29.2  MCHC 33.0 32.5 32.6  RDW 15.3 15.4 15.2  PLT 250 244 292    Cardiac Enzymes Recent Labs Lab 12/17/16 2006  TROPONINI <0.03   No results for input(s): TROPIPOC in the last 168 hours.   BNP Recent Labs Lab 12/17/16 2007  BNP 354.0*    Radiology    Dg Chest 2 View  Result Date: 12/17/2016 CLINICAL DATA:  Productive cough. Shortness of breath. Chest pain secondary to cough. EXAM: CHEST  2 VIEW  COMPARISON:  Radiographs 12/30/2013 FINDINGS: Post median sternotomy with prosthetic aortic valve. Unchanged heart size and mediastinal contours with mild cardiomegaly. Diffuse prominent interstitial markings which were not seen on prior exam. No confluent airspace disease. No pleural fluid or pneumothorax. No acute osseous abnormalities. IMPRESSION: Prominent interstitial opacities which may be infectious or pulmonary edema. Electronically Signed   By: Jeb Levering M.D.   On: 12/17/2016 22:03    Cardiac Studies   Echocardiogram 09/28/2014: Left ventricle: The cavity size was normal. Wall thickness was increased in a pattern of mild LVH. Systolic function was normal. The estimated ejection fraction was in the range of 60% to 65%. Wall motion was normal; there were no regional wall motion abnormalities. The study is not technically sufficient to allow evaluation of LV diastolic function. Rhythm during study appears to be atrial flutter with 2:1 and variable conduction. - Aortic valve: A St. Jude [employer] mechanical  prosthesis was present. There was no significant regurgitation. Mean gradient (S): 6 mm Hg. VTI ratio of LVOT to aortic valve: 0.58. - Mitral valve: Moderately calcified annulus. There was trivial regurgitation. - Left atrium: The atrium was mildly dilated. - Tricuspid valve: There was trivial regurgitation. - Pulmonary arteries: Systolic pressure could not be accurately estimated. - Pericardium, extracardiac: A prominent pericardial fat pad was present. There was no pericardial effusion.   Patient Profile     66 y.o. female with a history of persistent atrial fibrillation, St. Jude AVR in 2007, intermittent leg edema, presenting now with acute on chronic diastolic heart failure and volume overload. Also possible bronchitis/COPD exacerbation.  Assessment & Plan    1. Acute on Chronic Diastolic CHF: She has diuresed 1.8 liters with IV diureses. Creatinine 1.31 with CO2 28. Wt not recorded today. She is improving. Uncertain dry weight, last weight noted when she was compensated was 201 lbs. Consider transition to PO torsemide in am if she continues to diurese without increase in creatinine. Review of home medications has her on torsemide 60 mg BID most recently. She will need to decrease salt intake.  2. Atrial fibrillation: On metoprolol 50 mg XL daily which increased yesterday. Elevated heart rate may also be contributing to some heart failure symptoms, continue to follow in case further up titration as necessary. Continue coumadin per pharmacy.   3. Hx of St Jude AVR (2007): Stable per echocardiogram in 2015.  4. Chronic leg edema: Improved with IV diureses. She needs to decrease salt intake. Consider support hose as OP.    Signed, Jory Sims, NP  12/19/2016, 9:55 AM     Attending note:  Patient seen and examined. Modified above note by Ms. Lawrence NP. [name] is starting to feel some better, has diuresed 1.8 L on IV Lasix. Creatinine 1.3 today. May be able to  transition back to [facility] tomorrow depending on how she does. Heart rate is somewhat better today after increasing Toprol-XL, but would continue to follow in case additional titration is needed. Pharmacy is following Coumadin adjustments. If she goes home over the weekend, please make sure that she has a follow-up visit scheduled with [name] in the next few weeks. She was to be seen back in April routinely.  Satira Sark, M.D., F.A.C.C.

## 2016-12-20 LAB — PROTIME-INR
INR: 2.96
PROTHROMBIN TIME: 31.5 s — AB (ref 11.4–15.2)

## 2016-12-20 MED ORDER — WARFARIN SODIUM 2 MG PO TABS: 2.0000 mg | ORAL_TABLET | Freq: Once | ORAL | Status: DC

## 2016-12-20 MED ORDER — HYDROCOD POLST-CPM POLST ER 10-8 MG/5ML PO SUER: 5.0000 mL | Freq: Two times a day (BID) | ORAL | 0 refills | Status: DC | PRN

## 2016-12-20 MED ORDER — PREDNISONE 20 MG PO TABS: 40.0000 mg | ORAL_TABLET | Freq: Every day | ORAL | 0 refills | Status: AC

## 2016-12-20 MED ORDER — METOPROLOL SUCCINATE ER 50 MG PO TB24: 50.0000 mg | ORAL_TABLET | Freq: Every day | ORAL | 0 refills | Status: DC

## 2016-12-20 MED ORDER — AZITHROMYCIN 250 MG PO TABS: ORAL_TABLET | ORAL | 0 refills | Status: DC

## 2016-12-20 NOTE — Discharge Summary (Signed)
Physician Discharge Summary  EILZABETH Mendez [id number] DOB: [birth year] DOA: 12/17/2016  PCP: Octavio Graves, DO  Admit date: 12/17/2016 Discharge date: 12/20/2016  Admitted From: Home  Disposition: Home   Recommendations for Outpatient Follow-up:  1. Follow up with PCP in 1 weeks and cardiology in 2 weeks 2. Please obtain BMP/CBC in one week  Discharge Condition: STABLE  CODE STATUS: FULL   Brief/Interim Summary: HPI: [name] is an 66 y.o. 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  Evaluation in the ER included a BNP 350. CXR with edema vs infiltrate, and EKG showed afib with rate controlled and no ischemic changes, Hb of 9 g per dL, K of 3.3 mm/L, and Cr of 1.26.  She was given Neb and felt better.  She was also given IV lasix 60mg  just now, and hospitalist was asked to admit her for acute on chronic diastolic CHF.  She has no chest pain and her troponin was negaitve.   Acute on chronic diastolic CHF: Continue with her cardiac meds. She is diuresing now down 1.8L.  Was given IV Lasix at 40mg  BID with good results and discharge home on home dose demadex. Followed Cr in hospital and remained stable. Since her last ECHO was in 2016, repeated and noted below:   Impressions:  - Mild LVH with LVEF 65-70%. Indeterminate diastolic function.   Moderate left atrial enlargement. Moderately to severely   calcified mitral annulus with mild mitral regurgitation. St. Jude   prosthesis in aortic position with grossly normal function, mean   gradient 9 mmHg, no obvious perivalvular leak. Mild tricuspid   regurgitation.  AVR mechanical valve: Continue with coumadin per pharmacy dosing.   Afib with RVR:  Cardiology increased her metoprolol with good  results - see notes, follow on telemetry. Pt tolerated toprol XL 50 mg daily with good results.  Pt advised to follow up with cardiologist in 2 weeks.  Follow up with PCP next week.    COPD exacerbation:  on steroids, continue with nebs.    DVT prophylaxis:Coumadin.  Code Status:FULL CODE.  Family Communication:Husband Disposition Plan:Home no  PT follow up recommended - see notes Consults called:cardiology Admission status:FULL ADMISSION.   Consultants:  cardiology  Discharge Diagnoses:  Principal Problem:   Acute on chronic diastolic CHF (congestive heart failure) ([place]) Active Problems:   Atrial fibrillation (HCC)   GERD   Aortic valve replaced   Long term current use of anticoagulant therapy   COPD exacerbation [facility])  Discharge Instructions  Discharge Instructions    Diet - low sodium heart healthy    Complete by:  As directed    Increase activity slowly    Complete by:  As directed      Allergies as of 12/20/2016      Reactions   Penicillins Hives, Itching, Swelling, Rash   Has patient had a PCN reaction causing immediate rash, facial/tongue/throat swelling, SOB or lightheadedness with hypotension: Yes Has patient had a PCN reaction causing severe rash involving mucus membranes or skin necrosis: Yes Has patient had a PCN reaction that required hospitalization: unknown Has patient had a PCN reaction occurring within the last 10 years: No If all of the above answers are "NO", then may proceed with Cephalosporin use. Throat swelling  Medication List    TAKE these medications   albuterol 108 (90 Base) MCG/ACT inhaler Commonly known as:  PROVENTIL HFA;VENTOLIN HFA Inhale 1-2 puffs into the lungs every 6 (six) hours as needed for wheezing or shortness of breath.   allopurinol 100 MG tablet Commonly known as:  ZYLOPRIM Take 100 mg by mouth daily.   ALPRAZolam 1 MG tablet Commonly known as:  XANAX Take 1 mg by mouth 3 (three) times daily as needed  for anxiety.   azithromycin 250 MG tablet Commonly known as:  ZITHROMAX Take 2 tabs PO x 1 dose, then 1 tab PO QD x 4 days   calcium carbonate 600 MG Tabs tablet Commonly known as:  OS-CAL Take 600 mg by mouth 2 (two) times daily.   cetirizine 10 MG tablet Commonly known as:  ZYRTEC Take 10 mg by mouth daily.   chlorpheniramine-HYDROcodone 10-8 MG/5ML Suer Commonly known as:  TUSSIONEX PENNKINETIC ER Take 5 mLs by mouth every 12 (twelve) hours as needed for cough.   CRESTOR 10 MG tablet Generic drug:  rosuvastatin Take 10 mg by mouth at bedtime.   cyclobenzaprine 10 MG tablet Commonly known as:  FLEXERIL Take 10 mg by mouth at bedtime.   HYDROcodone-acetaminophen 10-325 MG tablet Commonly known as:  NORCO Take 1 tablet by mouth at bedtime.   levothyroxine 50 MCG tablet Commonly known as:  SYNTHROID, LEVOTHROID Take 50 mcg by mouth every morning.   MAG-DELAY 535 (64 Mg) MG Tbcr Generic drug:  Magnesium Chloride Take 1 tablet by mouth at bedtime.   metoprolol succinate 50 MG 24 hr tablet Commonly known as:  TOPROL-XL Take 1 tablet (50 mg total) by mouth daily. What changed:  medication strength  how much to take   multivitamin with minerals Tabs tablet Take 1 tablet by mouth daily.   omeprazole 20 MG capsule Commonly known as:  PRILOSEC Take 20 mg by mouth daily.   predniSONE 20 MG tablet Commonly known as:  DELTASONE Take 2 tablets (40 mg total) by mouth daily with breakfast.   ramipril 2.5 MG capsule Commonly known as:  ALTACE Take 2.5 mg by mouth daily.   rOPINIRole 3 MG tablet Commonly known as:  REQUIP Take 6 mg by mouth at bedtime.   temazepam 30 MG capsule Commonly known as:  RESTORIL Take 30 mg by mouth at bedtime as needed for sleep.   torsemide 20 MG tablet Commonly known as:  DEMADEX TAKE TWO AND ONE-HALF TABLETS TWICE A DAY What changed:  See the new instructions.   warfarin 2 MG tablet Commonly known as:  COUMADIN Take 2-4 mg by  mouth every evening. Takes one tablet (2mg ) everyday besides on Sunday when she takes 2 tablets (4mg )      Follow-up Information    CYNTHIA BUTLER, DO. Schedule an appointment as soon as possible for a visit in 1 week(s).   Why:  Hospital Follow Up  Contact information: [place] [address] [phone]        Rozann Lesches, MD. Schedule an appointment as soon as possible for a visit in 2 week(s).   Specialty:  Cardiology Why:  Hospital Follow Up  Contact information: [address] [place] [address] [phone]          Allergies  Allergen Reactions  . Penicillins Hives, Itching, Swelling and Rash    Has patient had a PCN reaction causing immediate rash, facial/tongue/throat swelling, SOB or lightheadedness with hypotension: Yes Has patient had  a PCN reaction causing severe rash involving mucus membranes or skin necrosis: Yes Has patient had a PCN reaction that required hospitalization: unknown Has patient had a PCN reaction occurring within the last 10 years: No If all of the above answers are "NO", then may proceed with Cephalosporin use.    Throat swelling     Procedures/Studies: Dg Chest 2 View  Result Date: 12/17/2016 CLINICAL DATA:  Productive cough. Shortness of breath. Chest pain secondary to cough. EXAM: CHEST  2 VIEW COMPARISON:  Radiographs 12/30/2013 FINDINGS: Post median sternotomy with prosthetic aortic valve. Unchanged heart size and mediastinal contours with mild cardiomegaly. Diffuse prominent interstitial markings which were not seen on prior exam. No confluent airspace disease. No pleural fluid or pneumothorax. No acute osseous abnormalities. IMPRESSION: Prominent interstitial opacities which may be infectious or pulmonary edema. Electronically Signed   By: Jeb Levering M.D.   On: 12/17/2016 22:03   Dg Chest Port 1 View  Result Date: 12/19/2016 CLINICAL DATA:  Congestive heart failure EXAM: PORTABLE CHEST 1 VIEW COMPARISON:   12/17/2016 FINDINGS: Cardiomegaly again noted. Status post aortic valve replacement and median sternotomy. Persistent mild interstitial prominence bilaterally. Mild pneumonitis or interstitial edema cannot be excluded. No significant vascular congestion. No segmental infiltrate. Mild basilar atelectasis. IMPRESSION: Persistent mild interstitial prominence bilaterally. Mild pneumonitis or interstitial edema cannot be excluded. No significant vascular congestion. No segmental infiltrate. Mild basilar atelectasis. Electronically Signed   By: Lahoma Crocker M.D.   On: 12/19/2016 09:55     Subjective: Pt reports that she feels better.  She had diuresed well and her HR is better.  Her SOB is much better.  She ambulated with PT well.    Discharge Exam: Vitals:   12/19/16 2100 12/20/16 0500  BP: 96/64 117/75  Pulse: 86 75  Resp: 18 18  Temp: 98.2 F (36.8 C) 97.5 F (36.4 C)   Vitals:   12/19/16 0536 12/19/16 1316 12/19/16 2100 12/20/16 0500  BP: 97/64 104/72 96/64 117/75  Pulse: 83 81 86 75  Resp: 20 18 18 18   Temp: 98.2 F (36.8 C) 98.2 F (36.8 C) 98.2 F (36.8 C) 97.5 F (36.4 C)  TempSrc: Oral Oral Oral Oral  SpO2: 99% 100% 93% 90%  Weight:    91.5 kg (201 lb 12.8 oz)  Height:       General exam: awake, alert, NAD.  Respiratory system:  No increased work of breathing. Cardiovascular system: S1 & S2 heard.  Gastrointestinal system: Abdomen is nondistended, soft and nontender. Normal bowel sounds heard. Central nervous system: Alert and oriented. No focal neurological deficits. Extremities: no cyanosis, trace pretibial edema. Chronic venous stasis changes noted RLE.   The results of significant diagnostics from this hospitalization (including imaging, microbiology, ancillary and laboratory) are listed below for reference.     Microbiology: No results found for this or any previous visit (from the past 240 hour(s)).   Labs: BNP (last 3 results)  Recent Labs  12/17/16 2007   BNP 0000000*   Basic Metabolic Panel:  Recent Labs Lab 12/17/16 2006 12/18/16 0443 12/19/16 0555  NA 134* 136 136  K 3.3* 3.5 3.7  CL 100* 99* 100*  CO2 29 26 28   GLUCOSE 98 170* 126*  BUN 16 17 27*  CREATININE 1.26* 1.26* 1.31*  CALCIUM 8.3* 8.5* 8.7*   Liver Function Tests: No results for input(s): AST, ALT, ALKPHOS, BILITOT, PROT, ALBUMIN in the last 168 hours. No results for input(s): LIPASE, AMYLASE in the last 168 hours.  No results for input(s): AMMONIA in the last 168 hours. CBC:  Recent Labs Lab 12/17/16 2006 12/18/16 0443 12/19/16 0555  WBC 9.4 7.6 13.2*  NEUTROABS 6.6  --   --   HGB 9.9* 10.3* 10.4*  HCT 30.0* 31.7* 31.9*  MCV 89.6 90.1 89.6  PLT 250 244 292   Cardiac Enzymes:  Recent Labs Lab 12/17/16 2006  TROPONINI <0.03   BNP: Invalid input(s): POCBNP CBG: No results for input(s): GLUCAP in the last 168 hours. D-Dimer No results for input(s): DDIMER in the last 72 hours. Hgb A1c No results for input(s): HGBA1C in the last 72 hours. Lipid Profile No results for input(s): CHOL, HDL, LDLCALC, TRIG, CHOLHDL, LDLDIRECT in the last 72 hours. Thyroid function studies  Recent Labs  12/17/16 2006  TSH 1.135   Anemia work up No results for input(s): VITAMINB12, FOLATE, FERRITIN, TIBC, IRON, RETICCTPCT in the last 72 hours. Urinalysis No results found for: COLORURINE, APPEARANCEUR, LABSPEC, [place], GLUCOSEU, HGBUR, BILIRUBINUR, KETONESUR, PROTEINUR, UROBILINOGEN, NITRITE, LEUKOCYTESUR Sepsis Labs Invalid input(s): PROCALCITONIN,  WBC,  LACTICIDVEN Microbiology No results found for this or any previous visit (from the past 240 hour(s)).  Time coordinating discharge: 34 minutes  SIGNED:  Irwin Brakeman, MD  [facility] 12/20/2016, 10:21 AM Pager   If 7PM-7AM, please contact night-coverage [email or web address] Password [email or web address]

## 2016-12-20 NOTE — Progress Notes (Signed)
Patient is to be discharged home and in stable condition. Patient and family present for discharge teaching and both verbalize understanding of medication regimen and the need for follow-up appointments. Patient will be escorted out by staff.  Celestia Khat, RN

## 2016-12-20 NOTE — Progress Notes (Signed)
ANTICOAGULATION CONSULT NOTE -  Pharmacy Consult for coumadin Indication: AVR, afib  Allergies  Allergen Reactions  . Penicillins Hives, Itching, Swelling and Rash    Has patient had a PCN reaction causing immediate rash, facial/tongue/throat swelling, SOB or lightheadedness with hypotension: Yes Has patient had a PCN reaction causing severe rash involving mucus membranes or skin necrosis: Yes Has patient had a PCN reaction that required hospitalization: unknown Has patient had a PCN reaction occurring within the last 10 years: No If all of the above answers are "NO", then may proceed with Cephalosporin use.    Throat swelling    Patient Measurements: Height: 5\' 8"  (172.7 cm) Weight: 201 lb 12.8 oz (91.5 kg) IBW/kg (Calculated) : 63.9   Vital Signs: Temp: 97.5 F (36.4 C) (03/03 0500) Temp Source: Oral (03/03 0500) BP: 117/75 (03/03 0500) Pulse Rate: 75 (03/03 0500)  Labs:  Recent Labs  12/17/16 2006 12/18/16 0443 12/19/16 0555 12/20/16 0642  HGB 9.9* 10.3* 10.4*  --   HCT 30.0* 31.7* 31.9*  --   PLT 250 244 292  --   LABPROT 24.5* 24.0* 31.1* 31.5*  INR 2.16 2.11 2.92 2.96  CREATININE 1.26* 1.26* 1.31*  --   TROPONINI <0.03  --   --   --     Estimated Creatinine Clearance: 50.6 mL/min (by C-G formula based on SCr of 1.31 mg/dL (H)).   Medical History: Past Medical History:  Diagnosis Date  . Anxiety disorder   . Aortic stenosis    Status post St. Jude mechanical AVR 2007  . Asthma   . Atrial fibrillation ([place])   . Carcinoid tumor of colon 2007  . Coronary atherosclerosis of native coronary artery    Status post CABG 2007  . GERD (gastroesophageal reflux disease)   . History of colonoscopy 2003   Dr. Gala Romney - normal  . Hyperlipidemia   . Macromastia   . Pedal edema    Bilateral, chronic  . Peptic stricture of esophagus 10/04/2010   GE junction on last EGD by Dr. Gala Romney, benign biopsies  . RLS (restless legs syndrome)   . Schatzki's ring      Medications:  Prescriptions Prior to Admission  Medication Sig Dispense Refill Last Dose  . albuterol (PROVENTIL HFA;VENTOLIN HFA) 108 (90 BASE) MCG/ACT inhaler Inhale 1-2 puffs into the lungs every 6 (six) hours as needed for wheezing or shortness of breath.    12/17/2016 at Unknown time  . allopurinol (ZYLOPRIM) 100 MG tablet Take 100 mg by mouth daily.   12/17/2016 at Unknown time  . ALPRAZolam (XANAX) 1 MG tablet Take 1 mg by mouth 3 (three) times daily as needed for anxiety.    12/17/2016 at Unknown time  . calcium carbonate (OS-CAL) 600 MG TABS tablet Take 600 mg by mouth 2 (two) times daily.   12/17/2016 at Unknown time  . cetirizine (ZYRTEC) 10 MG tablet Take 10 mg by mouth daily.     12/17/2016 at Unknown time  . CRESTOR 10 MG tablet Take 10 mg by mouth at bedtime.    12/17/2016 at Unknown time  . cyclobenzaprine (FLEXERIL) 10 MG tablet Take 10 mg by mouth at bedtime.    12/17/2016 at Unknown time  . HYDROcodone-acetaminophen (NORCO) 10-325 MG per tablet Take 1 tablet by mouth at bedtime.    12/17/2016 at Unknown time  . levothyroxine (SYNTHROID, LEVOTHROID) 50 MCG tablet Take 50 mcg by mouth every morning.    12/17/2016 at Unknown time  . Magnesium Chloride (MAG-DELAY) 535 (  64 MG) MG TBCR Take 1 tablet by mouth at bedtime.    12/17/2016 at Unknown time  . metoprolol succinate (TOPROL-XL) 25 MG 24 hr tablet Take 25 mg by mouth daily.     12/17/2016 at Unknown time  . Multiple Vitamin (MULTIVITAMIN WITH MINERALS) TABS tablet Take 1 tablet by mouth daily.   12/17/2016 at Unknown time  . omeprazole (PRILOSEC) 20 MG capsule Take 20 mg by mouth daily.    12/17/2016 at Unknown time  . ramipril (ALTACE) 2.5 MG capsule Take 2.5 mg by mouth daily.    12/17/2016 at Unknown time  . rOPINIRole (REQUIP) 3 MG tablet Take 6 mg by mouth at bedtime.    12/17/2016 at Unknown time  . temazepam (RESTORIL) 30 MG capsule Take 30 mg by mouth at bedtime as needed for sleep.    12/16/2016 at Unknown time  . torsemide  (DEMADEX) 20 MG tablet TAKE TWO AND ONE-HALF TABLETS TWICE A DAY (Patient taking differently: TAKE THREE TABLETS TWICE A DAY) 450 tablet 3 12/17/2016 at Unknown time  . warfarin (COUMADIN) 2 MG tablet Take 2-4 mg by mouth every evening. Takes one tablet (2mg ) everyday besides on Sunday when she takes 2 tablets (4mg )   12/17/2016 at 1900    Assessment: 66 yo lady to continue coumadin for St Jude's AVR and afib.  Pt is followed in anti-coag clinic. INR 2.96 today. Patient takes 2mg  daily except 4mg  on Sunday.   Goal of Therapy:  INR 2-3   Plan:  Coumadin 2 mg po today Daily PT/INR Monitor for bleeding complications  Isac Sarna, BS [place], BCPS Clinical Pharmacist Pager [phone] 12/20/2016,8:41 AM

## 2016-12-23 ENCOUNTER — Encounter: Payer: Self-pay | Admitting: Internal Medicine

## 2016-12-30 ENCOUNTER — Ambulatory Visit (INDEPENDENT_AMBULATORY_CARE_PROVIDER_SITE_OTHER): Payer: Medicare HMO | Admitting: *Deleted

## 2016-12-30 DIAGNOSIS — I4891 Unspecified atrial fibrillation: Secondary | ICD-10-CM

## 2016-12-30 DIAGNOSIS — Z952 Presence of prosthetic heart valve: Secondary | ICD-10-CM

## 2016-12-30 DIAGNOSIS — Z5181 Encounter for therapeutic drug level monitoring: Secondary | ICD-10-CM | POA: Diagnosis not present

## 2016-12-30 DIAGNOSIS — Z7901 Long term (current) use of anticoagulants: Secondary | ICD-10-CM | POA: Diagnosis not present

## 2016-12-30 LAB — POCT INR: INR: 2.9

## 2017-01-05 ENCOUNTER — Telehealth: Payer: Self-pay | Admitting: Cardiology

## 2017-01-05 NOTE — Telephone Encounter (Signed)
ADVISED PATIENT SHE WAS TO BE TAKING TOPROL 50MG . PATIENT UNDERSTOOD

## 2017-01-05 NOTE — Telephone Encounter (Signed)
Patient called wanting to know if she is suppose to be taking 50 mg of Toprol instead of 25 mg.

## 2017-01-08 DIAGNOSIS — M25511 Pain in right shoulder: Secondary | ICD-10-CM | POA: Diagnosis not present

## 2017-01-26 NOTE — Progress Notes (Signed)
Cardiology Office Note  Date: 01/27/2017   ID: [name], DOB Oct 09, 1951, MRN [id number]  PCP: Octavio Graves, DO  Primary Cardiologist: Rozann Lesches, MD   Chief Complaint  Patient presents with  . Hospitalization Follow-up    History of Present Illness: [name] is a 66 y.o. female last seen in the office October 2017. More recently she was admitted to the hospital in March with acute on chronic diastolic heart failure and suspected COPD exacerbation. Follow-up echocardiogram from that admission is outlined below.She presents today for follow-up, states that she feels better. Her weight is down about 5 pounds. She reports compliance with her medications including Demadex at 60 mg twice daily. She admits that her diet has not been very strict, we discussed sodium restriction.  She continues to follow with Dr. Melina Copa. Reports no major health changes.  She continues on Coumadin, last INR was 2.9. No reported bleeding episodes.  Past Medical History:  Diagnosis Date  . Anxiety disorder   . Aortic stenosis    Status post St. Jude mechanical AVR 2007  . Asthma   . Atrial fibrillation ([place])   . Carcinoid tumor of colon 2007  . Coronary atherosclerosis of native coronary artery    Status post CABG 2007  . GERD (gastroesophageal reflux disease)   . History of colonoscopy 2003   Dr. Gala Romney - normal  . Hyperlipidemia   . Macromastia   . Pedal edema    Bilateral, chronic  . Peptic stricture of esophagus 10/04/2010   GE junction on last EGD by Dr. Gala Romney, benign biopsies  . RLS (restless legs syndrome)   . Schatzki's ring     Past Surgical History:  Procedure Laterality Date  . ABDOMINAL HYSTERECTOMY    . AORTIC VALVE REPLACEMENT  2007   #25 mm St. 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 APPENDECTOMY  2007  . BACK SURGERY     lumbar 4 and 5   . BIOPSY  02/05/2012   RMR:Two tongues of salmon-colored epithelium distal  esophagus, very short-segment Barrett's s/p bx/Small hiatal hernia, otherwise normal stomach, D1, D2. Status post esophageal dilation. Biopsy showed GERD.  . Breast cyst removed     bilateral  . BREAST REDUCTION SURGERY    . CESAREAN SECTION    . 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 COLONOSCOPY  02/05/2012   ELF:YBOFBP rectum, sigmoid diverticulosis,descending colon polyp , tubular adenoma  . CORONARY ARTERY BYPASS GRAFT     06/2006 - RIMA to RCA, SVG to RCA  . ESOPHAGOGASTRODUODENOSCOPY  10/03/2010   Dr. Gala Romney- schatzki's ring, shoft peptic stricture at GE junction.  Marland Kitchen FOOT SURGERY     bilateral bunionectomy  . HERNIA REPAIR     with mesh  . LAPAROTOMY  2007   small bowel resection secondary to small bowel obstruction  . MALONEY DILATION  02/05/2012   Procedure: Venia Minks DILATION;  Surgeon: Daneil Dolin, MD;  Location: [facility];  Service: Endoscopy;  Laterality: N/A;  63mm   . Teeth removal      Current Outpatient Prescriptions  Medication Sig Dispense Refill  . albuterol (PROVENTIL HFA;VENTOLIN HFA) 108 (90 BASE) MCG/ACT inhaler Inhale 1-2 puffs into the lungs every 6 (six) hours as needed for wheezing or shortness of breath.     . allopurinol (ZYLOPRIM) 100 MG tablet Take 100 mg by mouth daily.    Marland Kitchen ALPRAZolam (XANAX) 1 MG tablet Take  1 mg by mouth 3 (three) times daily as needed for anxiety.     Marland Kitchen azithromycin (ZITHROMAX) 250 MG tablet Take 2 tabs PO x 1 dose, then 1 tab PO QD x 4 days 6 tablet 0  . calcium carbonate (OS-CAL) 600 MG TABS tablet Take 600 mg by mouth 2 (two) times daily.    . cetirizine (ZYRTEC) 10 MG tablet Take 10 mg by mouth daily.      . chlorpheniramine-HYDROcodone (TUSSIONEX PENNKINETIC ER) 10-8 MG/5ML SUER Take 5 mLs by mouth every 12 (twelve) hours as needed for cough. 60 mL 0  . CRESTOR 10 MG tablet Take 10 mg by mouth at bedtime.     . cyclobenzaprine (FLEXERIL) 10 MG tablet Take 10 mg by mouth at bedtime.     Marland Kitchen  HYDROcodone-acetaminophen (NORCO) 10-325 MG per tablet Take 1 tablet by mouth at bedtime.     Marland Kitchen levothyroxine (SYNTHROID, LEVOTHROID) 50 MCG tablet Take 50 mcg by mouth every morning.     . Magnesium Chloride (MAG-DELAY) 535 (64 MG) MG TBCR Take 1 tablet by mouth at bedtime.     . metoprolol succinate (TOPROL-XL) 50 MG 24 hr tablet Take 1 tablet (50 mg total) by mouth daily. 30 tablet 0  . Multiple Vitamin (MULTIVITAMIN WITH MINERALS) TABS tablet Take 1 tablet by mouth daily.    Marland Kitchen omeprazole (PRILOSEC) 20 MG capsule Take 20 mg by mouth daily.     . ramipril (ALTACE) 2.5 MG capsule Take 2.5 mg by mouth daily.     Marland Kitchen rOPINIRole (REQUIP) 3 MG tablet Take 6 mg by mouth at bedtime.     . temazepam (RESTORIL) 30 MG capsule Take 30 mg by mouth at bedtime as needed for sleep.     Marland Kitchen torsemide (DEMADEX) 20 MG tablet Take 20 mg by mouth 2 (two) times daily. Take 3 tablets (60 mg) in the morning and 3 tablets (60 mg) in the evening    . warfarin (COUMADIN) 2 MG tablet Take 2-4 mg by mouth every evening. Takes one tablet (2mg ) everyday besides on Sunday when she takes 2 tablets (4mg )     No current facility-administered medications for this visit.    Allergies:  Penicillins   Social History: The patient  reports that she quit smoking about 28 years ago. Her smoking use included Cigarettes. She has a 20.00 pack-year smoking history. She has never used smokeless tobacco. She reports that she does not drink alcohol or use drugs.   ROS:  Please see the history of present illness. Otherwise, complete review of systems is positive for none.  All other systems are reviewed and negative.   Physical Exam: VS:  BP 110/73   Pulse 87   Ht 5\' 8"  (1.727 m)   Wt 196 lb 6.4 oz (89.1 kg)   SpO2 95%   BMI 29.86 kg/m , BMI Body mass index is 29.86 kg/m.  Wt Readings from Last 3 Encounters:  01/27/17 196 lb 6.4 oz (89.1 kg)  12/20/16 201 lb 12.8 oz (91.5 kg)  07/31/16 201 lb (91.2 kg)    Obese woman, appears  comfortable at rest.  HEENT: Conjunctiva and lids normal, oropharynx clear.  Neck: Supple, no elevated JVP or carotid bruits, no thyromegaly.  Lungs: Clear to auscultation, nonlabored breathing at rest.  Cardiac: Regular rate and rhythm, no S3 with crisp mechanical prosthetic click in S2., no pericardial rub.  Abdomen: Soft, nontender, protuberant, bowel sounds present.  Extremities: Mild chronic appearing lower leg edema and  venous stasis, distal pulses 1+. Skin: Warm and dry. Musculoskeletal: No kyphosis. Neuropsychiatric: Alert and oriented 3, affect appropriate.  ECG: I personally reviewed the tracing from 12/17/2016 which showed rate-controlled atrial fibrillation.  Recent Labwork: 12/17/2016: B Natriuretic Peptide 354.0; TSH 1.135 12/19/2016: BUN 27; Creatinine, Ser 1.31; Hemoglobin 10.4; Platelets 292; Potassium 3.7; Sodium 136   Other Studies Reviewed Today:  Echocardiogram 12/19/2016: Study Conclusions  - Left ventricle: The cavity size was normal. Wall thickness was   increased in a pattern of mild LVH. Systolic function was   vigorous. The estimated ejection fraction was in the range of 65%   to 70%. Wall motion was normal; there were no regional wall   motion abnormalities. The study is not technically sufficient to   allow evaluation of LV diastolic function. - Aortic valve: A St. Jude Medical mechanical prosthesis was   present. Mean gradient (S): 9 mm Hg. Peak gradient (S): 18 mm Hg.   VTI ratio of LVOT to aortic valve: 0.43. - Mitral valve: Moderately to severely calcified annulus. Mildly   thickened leaflets . There was mild regurgitation. - Left atrium: The atrium was moderately dilated. - Right atrium: Central venous pressure (est): 8 mm Hg. - Atrial septum: No defect or patent foramen ovale was identified. - Tricuspid valve: There was mild regurgitation. - Pulmonary arteries: Systolic pressure could not be accurately   estimated. - Pericardium,  extracardiac: There was no pericardial effusion.  Impressions:  - Mild LVH with LVEF 65-70%. Indeterminate diastolic function.   Moderate left atrial enlargement. Moderately to severely   calcified mitral annulus with mild mitral regurgitation. St. Jude   prosthesis in aortic position with grossly normal function, mean   gradient 9 mmHg, no obvious perivalvular leak. Mild tricuspid   regurgitation.  Assessment and Plan:  1. Chronic diastolic heart failure. Plan to continue current dose of Demadex. Discussed following daily weights and restricting sodium intake.  2. History of aortic stenosis status post St. Jude mechanical AVR. She continues on Coumadin and had a recent echocardiogram showing grossly normal prosthetic valve function.  3. Chronic atrial fibrillation, heart rate controlled on current regimen. No changes made today.  4. CAD status post CABG in 2007. No active angina symptoms. Continue with observation.  Current medicines were reviewed with the patient today.  Disposition: Follow-up in 6 months.  Signed, Satira Sark, MD, [facility] 01/27/2017 10:04 AM    [facility] [place], [place], [place] [address] Phone: [phone]; Fax: [phone]

## 2017-01-27 ENCOUNTER — Ambulatory Visit (INDEPENDENT_AMBULATORY_CARE_PROVIDER_SITE_OTHER): Payer: Medicare HMO | Admitting: Cardiology

## 2017-01-27 ENCOUNTER — Encounter: Payer: Self-pay | Admitting: Cardiology

## 2017-01-27 VITALS — BP 110/73 | HR 87 | Ht 68.0 in | Wt 196.4 lb

## 2017-01-27 DIAGNOSIS — I2510 Atherosclerotic heart disease of native coronary artery without angina pectoris: Secondary | ICD-10-CM | POA: Diagnosis not present

## 2017-01-27 DIAGNOSIS — I5032 Chronic diastolic (congestive) heart failure: Secondary | ICD-10-CM

## 2017-01-27 DIAGNOSIS — Z952 Presence of prosthetic heart valve: Secondary | ICD-10-CM

## 2017-01-27 DIAGNOSIS — I482 Chronic atrial fibrillation, unspecified: Secondary | ICD-10-CM

## 2017-01-27 NOTE — Patient Instructions (Signed)

## 2017-02-10 ENCOUNTER — Ambulatory Visit (INDEPENDENT_AMBULATORY_CARE_PROVIDER_SITE_OTHER): Payer: Medicare HMO | Admitting: *Deleted

## 2017-02-10 DIAGNOSIS — Z7901 Long term (current) use of anticoagulants: Secondary | ICD-10-CM

## 2017-02-10 DIAGNOSIS — Z952 Presence of prosthetic heart valve: Secondary | ICD-10-CM

## 2017-02-10 DIAGNOSIS — Z5181 Encounter for therapeutic drug level monitoring: Secondary | ICD-10-CM | POA: Diagnosis not present

## 2017-02-10 DIAGNOSIS — I4891 Unspecified atrial fibrillation: Secondary | ICD-10-CM

## 2017-02-10 LAB — POCT INR: INR: 3.8

## 2017-03-03 ENCOUNTER — Ambulatory Visit (INDEPENDENT_AMBULATORY_CARE_PROVIDER_SITE_OTHER): Payer: Medicare HMO | Admitting: *Deleted

## 2017-03-03 DIAGNOSIS — I4891 Unspecified atrial fibrillation: Secondary | ICD-10-CM

## 2017-03-03 DIAGNOSIS — Z7901 Long term (current) use of anticoagulants: Secondary | ICD-10-CM | POA: Diagnosis not present

## 2017-03-03 DIAGNOSIS — Z5181 Encounter for therapeutic drug level monitoring: Secondary | ICD-10-CM

## 2017-03-03 DIAGNOSIS — Z952 Presence of prosthetic heart valve: Secondary | ICD-10-CM

## 2017-03-03 LAB — POCT INR: INR: 3.5

## 2017-03-03 MED ORDER — WARFARIN SODIUM 2 MG PO TABS: ORAL_TABLET | ORAL | 3 refills | Status: DC

## 2017-03-05 ENCOUNTER — Emergency Department (HOSPITAL_COMMUNITY): Payer: Medicare HMO

## 2017-03-05 ENCOUNTER — Emergency Department (HOSPITAL_COMMUNITY)
Admission: EM | Admit: 2017-03-05 | Discharge: 2017-03-05 | Disposition: A | Discharge status: Home / Self Care | Payer: Medicare HMO | Attending: Emergency Medicine | Admitting: Emergency Medicine

## 2017-03-05 ENCOUNTER — Encounter (HOSPITAL_COMMUNITY): Payer: Self-pay | Admitting: Emergency Medicine

## 2017-03-05 DIAGNOSIS — Z7901 Long term (current) use of anticoagulants: Secondary | ICD-10-CM | POA: Diagnosis not present

## 2017-03-05 DIAGNOSIS — R600 Localized edema: Secondary | ICD-10-CM | POA: Diagnosis not present

## 2017-03-05 DIAGNOSIS — Z951 Presence of aortocoronary bypass graft: Secondary | ICD-10-CM | POA: Diagnosis not present

## 2017-03-05 DIAGNOSIS — I5033 Acute on chronic diastolic (congestive) heart failure: Secondary | ICD-10-CM | POA: Diagnosis not present

## 2017-03-05 DIAGNOSIS — I25810 Atherosclerosis of coronary artery bypass graft(s) without angina pectoris: Secondary | ICD-10-CM | POA: Diagnosis not present

## 2017-03-05 DIAGNOSIS — Z79899 Other long term (current) drug therapy: Secondary | ICD-10-CM | POA: Diagnosis not present

## 2017-03-05 DIAGNOSIS — J449 Chronic obstructive pulmonary disease, unspecified: Secondary | ICD-10-CM | POA: Insufficient documentation

## 2017-03-05 DIAGNOSIS — Z87891 Personal history of nicotine dependence: Secondary | ICD-10-CM | POA: Insufficient documentation

## 2017-03-05 DIAGNOSIS — J45909 Unspecified asthma, uncomplicated: Secondary | ICD-10-CM | POA: Diagnosis not present

## 2017-03-05 DIAGNOSIS — M7989 Other specified soft tissue disorders: Secondary | ICD-10-CM | POA: Diagnosis present

## 2017-03-05 DIAGNOSIS — R609 Edema, unspecified: Secondary | ICD-10-CM

## 2017-03-05 DIAGNOSIS — R05 Cough: Secondary | ICD-10-CM | POA: Diagnosis not present

## 2017-03-05 LAB — CBC WITH DIFFERENTIAL/PLATELET
Basophils Absolute: 0 10*3/uL (ref 0.0–0.1)
Basophils Relative: 0 %
EOS ABS: 0.1 10*3/uL (ref 0.0–0.7)
Eosinophils Relative: 1 %
HCT: 32.2 % — ABNORMAL LOW (ref 36.0–46.0)
HEMOGLOBIN: 10.5 g/dL — AB (ref 12.0–15.0)
Lymphocytes Relative: 27 %
Lymphs Abs: 2.2 10*3/uL (ref 0.7–4.0)
MCH: 30 pg (ref 26.0–34.0)
MCHC: 32.6 g/dL (ref 30.0–36.0)
MCV: 92 fL (ref 78.0–100.0)
Monocytes Absolute: 0.7 10*3/uL (ref 0.1–1.0)
Monocytes Relative: 9 %
NEUTROS ABS: 5.1 10*3/uL (ref 1.7–7.7)
NEUTROS PCT: 63 %
Platelets: 210 10*3/uL (ref 150–400)
RBC: 3.5 MIL/uL — AB (ref 3.87–5.11)
RDW: 14.3 % (ref 11.5–15.5)
WBC: 8.1 10*3/uL (ref 4.0–10.5)

## 2017-03-05 LAB — COMPREHENSIVE METABOLIC PANEL
ALK PHOS: 77 U/L (ref 38–126)
ALT: 27 U/L (ref 14–54)
ANION GAP: 7 (ref 5–15)
AST: 34 U/L (ref 15–41)
Albumin: 4 g/dL (ref 3.5–5.0)
BILIRUBIN TOTAL: 0.7 mg/dL (ref 0.3–1.2)
BUN: 19 mg/dL (ref 6–20)
CALCIUM: 9 mg/dL (ref 8.9–10.3)
CO2: 33 mmol/L — AB (ref 22–32)
Chloride: 99 mmol/L — ABNORMAL LOW (ref 101–111)
Creatinine, Ser: 1.32 mg/dL — ABNORMAL HIGH (ref 0.44–1.00)
GFR calc non Af Amer: 41 mL/min — ABNORMAL LOW (ref >60)
GFR, EST AFRICAN AMERICAN: 48 mL/min — AB (ref >60)
Glucose, Bld: 107 mg/dL — ABNORMAL HIGH (ref 65–99)
Potassium: 4 mmol/L (ref 3.5–5.1)
SODIUM: 139 mmol/L (ref 135–145)
TOTAL PROTEIN: 7.6 g/dL (ref 6.5–8.1)

## 2017-03-05 LAB — TSH: TSH: 2.449 u[IU]/mL (ref 0.350–4.500)

## 2017-03-05 LAB — PROTIME-INR
INR: 2.21
Prothrombin Time: 24.9 seconds — ABNORMAL HIGH (ref 11.4–15.2)

## 2017-03-05 LAB — BRAIN NATRIURETIC PEPTIDE: B Natriuretic Peptide: 367 pg/mL — ABNORMAL HIGH (ref 0.0–100.0)

## 2017-03-05 MED ORDER — TORSEMIDE 20 MG PO TABS: 80.0000 mg | ORAL_TABLET | Freq: Two times a day (BID) | ORAL | 0 refills | Status: DC

## 2017-03-05 NOTE — ED Triage Notes (Signed)
Patient c/o swelling in legs bilaterally. Per patient swelling started 3 days ago. Patient states was seen here 2 months ago for same reason. Hx of CHF. Patient takes 120mg  of torsemide. Patient also c/o cough when she lays down at night x2 days.

## 2017-03-05 NOTE — Discharge Instructions (Signed)
Eliminate all salt in your diet

## 2017-03-05 NOTE — ED Provider Notes (Signed)
[facility] DEPT Provider Note   CSN: [id number] Arrival date & time: 03/05/17  1159     History   Chief Complaint Chief Complaint  Patient presents with  . Leg Swelling    HPI [name] is a 66 y.o. female.  She presents for evaluation of leg swelling, and is concerned that her weight is 30 pounds greater than it was 6 months ago.  She is taking torsemide 60 mg twice daily, and continues to use salt although she is trying to cut back.  She recently saw her cardiologist who gave her nutritional counseling and did not intervene with her torsemide dose.  The patient feels like she was urinating better when she was taking furosemide.  She denies orthopnea.  She has dyspnea upon exertion.  She has been eating well.  She denies fever, chills, nausea, vomiting, change in bowel or urinary habits.  He is able to ambulate without problems.  She is taking her usual medications.  There are no other known factors.  HPI  Past Medical History:  Diagnosis Date  . Anxiety disorder   . Aortic stenosis    Status post St. Jude mechanical AVR 2007  . Asthma   . Atrial fibrillation ([place])   . Carcinoid tumor of colon 2007  . Coronary atherosclerosis of native coronary artery    Status post CABG 2007  . GERD (gastroesophageal reflux disease)   . History of colonoscopy 2003   Dr. Gala Romney - normal  . Hyperlipidemia   . Macromastia   . Pedal edema    Bilateral, chronic  . Peptic stricture of esophagus 10/04/2010   GE junction on last EGD by Dr. Gala Romney, benign biopsies  . RLS (restless legs syndrome)   . Schatzki's ring     Patient Active Problem List   Diagnosis Date Noted  . Acute on chronic diastolic CHF (congestive heart failure) ([place]) 12/17/2016  . COPD exacerbation ([place]) 12/17/2016  . Bilateral leg edema 07/05/2014  . Encounter for therapeutic drug monitoring 11/15/2013  . Chronic venous insufficiency 08/26/2011  . Long term current use of anticoagulant therapy 01/11/2011  .  GERD 09/16/2010  . Aortic valve replaced 06/05/2009  . HYPERLIPIDEMIA-MIXED 06/04/2009  . Coronary atherosclerosis of native coronary artery 06/04/2009  . Atrial fibrillation ([place]) 06/04/2009    Past Surgical History:  Procedure Laterality Date  . ABDOMINAL HYSTERECTOMY    . AORTIC VALVE REPLACEMENT  2007   #25 mm St. 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 APPENDECTOMY  2007  . BACK SURGERY     lumbar 4 and 5   . BIOPSY  02/05/2012   RMR:Two tongues of salmon-colored epithelium distal esophagus, very short-segment Barrett's s/p bx/Small hiatal hernia, otherwise normal stomach, D1, D2. Status post esophageal dilation. Biopsy showed GERD.  . Breast cyst removed     bilateral  . BREAST REDUCTION SURGERY    . CESAREAN SECTION    . 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 COLONOSCOPY  02/05/2012   [facility]:DQQIWL rectum, sigmoid diverticulosis,descending colon polyp , tubular adenoma  . CORONARY ARTERY BYPASS GRAFT     06/2006 - RIMA to RCA, SVG to RCA  . ESOPHAGOGASTRODUODENOSCOPY  10/03/2010   Dr. Gala Romney- schatzki's ring, shoft peptic stricture at GE junction.  Marland Kitchen FOOT SURGERY     bilateral bunionectomy  . HERNIA REPAIR     with mesh  . LAPAROTOMY  2007   small bowel resection secondary to  small bowel obstruction  . MALONEY DILATION  02/05/2012   Procedure: Venia Minks DILATION;  Surgeon: Daneil Dolin, MD;  Location: [facility];  Service: Endoscopy;  Laterality: N/A;  65mm   . Teeth removal      OB History    No data available       Home Medications    Prior to Admission medications   Medication Sig Start Date End Date Taking? Authorizing Provider  acetaminophen (TYLENOL) 325 MG tablet Take 325 mg by mouth every 6 (six) hours as needed for mild pain or moderate pain.   Yes [provider]  albuterol (PROVENTIL HFA;VENTOLIN HFA) 108 (90 BASE) MCG/ACT inhaler Inhale 1-2 puffs into the lungs every 6 (six) hours as  needed for wheezing or shortness of breath.    Yes [provider]  allopurinol (ZYLOPRIM) 100 MG tablet Take 100 mg by mouth daily.   Yes [provider]  ALPRAZolam Duanne Moron) 1 MG tablet Take 1 mg by mouth 3 (three) times daily as needed for anxiety.  09/20/16  Yes [provider]  calcium carbonate (OS-CAL) 600 MG TABS tablet Take 600 mg by mouth 2 (two) times daily.   Yes [provider]  cetirizine (ZYRTEC) 10 MG tablet Take 10 mg by mouth daily.     Yes [provider]  CRESTOR 10 MG tablet Take 10 mg by mouth at bedtime.  12/02/13  Yes [provider]  cyclobenzaprine (FLEXERIL) 10 MG tablet Take 10 mg by mouth at bedtime.    Yes [provider]  HYDROcodone-acetaminophen (NORCO) 10-325 MG per tablet Take 1 tablet by mouth at bedtime.    Yes [provider]  levothyroxine (SYNTHROID, LEVOTHROID) 50 MCG tablet Take 50 mcg by mouth every morning.    Yes [provider]  Magnesium Chloride (MAG-DELAY) 535 (64 MG) MG TBCR Take 1 tablet by mouth at bedtime.    Yes [provider]  metoprolol succinate (TOPROL-XL) 50 MG 24 hr tablet Take 1 tablet (50 mg total) by mouth daily. 12/20/16  Yes Johnson, Clanford L, MD  Multiple Vitamin (MULTIVITAMIN WITH MINERALS) TABS tablet Take 1 tablet by mouth daily.   Yes [provider]  omeprazole (PRILOSEC) 20 MG capsule Take 20 mg by mouth daily.  02/22/13  Yes [provider]  ramipril (ALTACE) 2.5 MG capsule Take 2.5 mg by mouth daily.    Yes [provider]  rOPINIRole (REQUIP) 3 MG tablet Take 6 mg by mouth at bedtime.    Yes [provider]  temazepam (RESTORIL) 15 MG capsule Take 15 mg by mouth at bedtime as needed for sleep.    Yes [provider]  warfarin (COUMADIN) 2 MG tablet Takes one tablet (2mg ) everyday 03/03/17  Yes Branch, Alphonse Guild, MD  azithromycin (ZITHROMAX) 250 MG tablet Take 2 tabs PO x 1 dose, then 1 tab PO QD x  4 days Patient not taking: Reported on 03/05/2017 12/20/16   Murlean Iba, MD  chlorpheniramine-HYDROcodone (TUSSIONEX PENNKINETIC ER) 10-8 MG/5ML SUER Take 5 mLs by mouth every 12 (twelve) hours as needed for cough. Patient not taking: Reported on 03/05/2017 12/20/16   Murlean Iba, MD  torsemide (DEMADEX) 20 MG tablet Take 4 tablets (80 mg total) by mouth 2 (two) times daily. 03/05/17   Daleen Bo, MD    Family History Family History  Problem Relation Age of Onset  . Stroke Mother   . Cancer Father   . Anesthesia problems Neg Hx   .  Hypotension Neg Hx   . Malignant hyperthermia Neg Hx   . Pseudochol deficiency Neg Hx     Social History Social History  Substance Use Topics  . Smoking status: Former Smoker    Packs/day: 1.00    Years: 20.00    Types: Cigarettes    Quit date: 10/20/1988  . Smokeless tobacco: Never Used  . Alcohol use No     Allergies   Penicillins   Review of Systems Review of Systems  All other systems reviewed and are negative.    Physical Exam Updated Vital Signs BP 121/68 (BP Location: Left Arm)   Pulse 91   Temp 98 F (36.7 C) (Oral)   Resp (!) 22   Ht 5\' 8"  (1.727 m)   Wt 206 lb (93.4 kg)   SpO2 100%   BMI 31.32 kg/m   Physical Exam  Constitutional: She is oriented to person, place, and time. She appears well-developed. No distress.  Overweight  HENT:  Head: Normocephalic and atraumatic.  Eyes: Conjunctivae and EOM are normal. Pupils are equal, round, and reactive to light.  Neck: Normal range of motion and phonation normal. Neck supple.  Cardiovascular: Normal rate and regular rhythm.   Mechanical click consistent with a mechanical valve.  Pulmonary/Chest: Effort normal and breath sounds normal. No respiratory distress. She has no wheezes. She exhibits no tenderness.  Abdominal: Soft. She exhibits no distension. There is no tenderness. There is no guarding.  Musculoskeletal: Normal range of motion. She exhibits edema (2+  pitting edema lower legs bilaterally.).  Neurological: She is alert and oriented to person, place, and time. She exhibits normal muscle tone.  Skin: Skin is warm and dry.  Psychiatric: She has a normal mood and affect. Her behavior is normal. Judgment and thought content normal.  Nursing note and vitals reviewed.    ED Treatments / Results  Labs (all labs ordered are listed, but only abnormal results are displayed) Labs Reviewed  BRAIN NATRIURETIC PEPTIDE - Abnormal; Notable for the following:       Result Value   B Natriuretic Peptide 367.0 (*)    All other components within normal limits  COMPREHENSIVE METABOLIC PANEL - Abnormal; Notable for the following:    Chloride 99 (*)    CO2 33 (*)    Glucose, Bld 107 (*)    Creatinine, Ser 1.32 (*)    GFR calc non Af Amer 41 (*)    GFR calc Af Amer 48 (*)    All other components within normal limits  CBC WITH DIFFERENTIAL/PLATELET - Abnormal; Notable for the following:    RBC 3.50 (*)    Hemoglobin 10.5 (*)    HCT 32.2 (*)    All other components within normal limits  PROTIME-INR - Abnormal; Notable for the following:    Prothrombin Time 24.9 (*)    All other components within normal limits  TSH    EKG  EKG Interpretation None       Radiology Dg Chest 2 View  Result Date: 03/05/2017 CLINICAL DATA:  Swelling in the legs bilaterally, cough EXAM: CHEST  2 VIEW COMPARISON:  12/19/2016 FINDINGS: Post sternotomy changes and valve prosthesis. Trace pleural effusions. Cardiomegaly with mild central congestion. Mild diffuse interstitial prominence. No focal consolidation. Aortic atherosclerosis. No pneumothorax. IMPRESSION: 1. Trace bilateral effusions 2. Cardiomegaly with mild diffuse interstitial prominence, which may indicate mild edema. Electronically Signed   By: Donavan Foil M.D.   On: 03/05/2017 13:56    Procedures Procedures (including critical  care time)  Medications Ordered in ED Medications - No data to  display   Initial Impression / Assessment and Plan / ED Course  I have reviewed the triage vital signs and the nursing notes.  Pertinent labs & imaging results that were available during my care of the patient were reviewed by me and considered in my medical decision making (see chart for details).     Medications - No data to display  Patient Vitals for the past 24 hrs:  BP Temp Temp src Pulse Resp SpO2 Height Weight  03/05/17 1206 121/68 98 F (36.7 C) Oral 91 (!) 22 100 % 5\' 8"  (1.727 m) 206 lb (93.4 kg)    3:04 PM Reevaluation with update and discussion. After initial assessment and treatment, an updated evaluation reveals patient remained comfortable has had no change in clinical status.  Findings discussed with patient and husband, all questions answered. Careli Luzader L    Final Clinical Impressions(s) / ED Diagnoses   Final diagnoses:  Peripheral edema    Peripheral edema with mild BNP elevation, but no evidence for acute congestive heart failure.  Patient admits to continued dietary indiscretion, by using salt.  She was strongly encouraged to eliminate all salt from her diet.  Her husband is also trying to convince her of that.  Doubt ACS, PE or pneumonia.  Will increase torsemide to 80 mg twice a day, and recommend close follow-up.  Nursing Notes Reviewed/ Care Coordinated Applicable Imaging Reviewed Interpretation of Laboratory Data incorporated into ED treatment  The patient appears reasonably screened and/or stabilized for discharge and I doubt any other medical condition or other [facility] requiring further screening, evaluation, or treatment in the ED at this time prior to discharge.  Plan: Home Medications-increase torsemide dosing; Home Treatments-avoid also; return here if the recommended treatment, does not improve the symptoms; Recommended follow up-PCP next week as scheduled, and cardiology as needed.   New Prescriptions Current Discharge Medication List        Daleen Bo, MD 03/05/17 [phone]

## 2017-03-06 ENCOUNTER — Telehealth: Payer: Self-pay | Admitting: *Deleted

## 2017-03-06 NOTE — Telephone Encounter (Signed)
Per Jackelyn Poling, their office received the faxed form requesting clearance for surgery but need to know how many days this patient can stop her coumadin prior to surgery. Patient has not been scheduled for surgery yet because they are waiting on a response about the duration of holding coumadin. Debbie informed that Lattie Haw is out of the office until Monday.

## 2017-03-09 NOTE — Telephone Encounter (Signed)
Called and spoke to person at Dr Aliene Beams office.  Told them pt could come off coumadin 5 days before procedure but needs to be bridged with lovenox.  They will call me back with surgery date when it is scheduled.

## 2017-03-10 DIAGNOSIS — I10 Essential (primary) hypertension: Secondary | ICD-10-CM | POA: Diagnosis not present

## 2017-03-10 DIAGNOSIS — Z6831 Body mass index (BMI) 31.0-31.9, adult: Secondary | ICD-10-CM | POA: Diagnosis not present

## 2017-03-10 DIAGNOSIS — E039 Hypothyroidism, unspecified: Secondary | ICD-10-CM | POA: Diagnosis not present

## 2017-03-10 DIAGNOSIS — E782 Mixed hyperlipidemia: Secondary | ICD-10-CM | POA: Diagnosis not present

## 2017-03-10 DIAGNOSIS — M5136 Other intervertebral disc degeneration, lumbar region: Secondary | ICD-10-CM | POA: Diagnosis not present

## 2017-03-10 DIAGNOSIS — I5042 Chronic combined systolic (congestive) and diastolic (congestive) heart failure: Secondary | ICD-10-CM | POA: Diagnosis not present

## 2017-03-17 ENCOUNTER — Emergency Department (HOSPITAL_COMMUNITY): Payer: Medicare HMO

## 2017-03-17 ENCOUNTER — Encounter (HOSPITAL_COMMUNITY): Payer: Self-pay | Admitting: Emergency Medicine

## 2017-03-17 DIAGNOSIS — R0602 Shortness of breath: Secondary | ICD-10-CM | POA: Diagnosis not present

## 2017-03-17 DIAGNOSIS — Z951 Presence of aortocoronary bypass graft: Secondary | ICD-10-CM | POA: Diagnosis not present

## 2017-03-17 DIAGNOSIS — N183 Chronic kidney disease, stage 3 (moderate): Secondary | ICD-10-CM | POA: Diagnosis not present

## 2017-03-17 DIAGNOSIS — Z952 Presence of prosthetic heart valve: Secondary | ICD-10-CM | POA: Diagnosis not present

## 2017-03-17 DIAGNOSIS — I130 Hypertensive heart and chronic kidney disease with heart failure and stage 1 through stage 4 chronic kidney disease, or unspecified chronic kidney disease: Principal | ICD-10-CM | POA: Insufficient documentation

## 2017-03-17 DIAGNOSIS — F419 Anxiety disorder, unspecified: Secondary | ICD-10-CM | POA: Diagnosis not present

## 2017-03-17 DIAGNOSIS — I4891 Unspecified atrial fibrillation: Secondary | ICD-10-CM | POA: Insufficient documentation

## 2017-03-17 DIAGNOSIS — I350 Nonrheumatic aortic (valve) stenosis: Secondary | ICD-10-CM | POA: Insufficient documentation

## 2017-03-17 DIAGNOSIS — K219 Gastro-esophageal reflux disease without esophagitis: Secondary | ICD-10-CM | POA: Insufficient documentation

## 2017-03-17 DIAGNOSIS — Z7901 Long term (current) use of anticoagulants: Secondary | ICD-10-CM | POA: Diagnosis not present

## 2017-03-17 DIAGNOSIS — I5033 Acute on chronic diastolic (congestive) heart failure: Secondary | ICD-10-CM | POA: Insufficient documentation

## 2017-03-17 DIAGNOSIS — Z87891 Personal history of nicotine dependence: Secondary | ICD-10-CM | POA: Insufficient documentation

## 2017-03-17 DIAGNOSIS — Z79899 Other long term (current) drug therapy: Secondary | ICD-10-CM | POA: Diagnosis not present

## 2017-03-17 DIAGNOSIS — Z880 Allergy status to penicillin: Secondary | ICD-10-CM | POA: Insufficient documentation

## 2017-03-17 DIAGNOSIS — R05 Cough: Secondary | ICD-10-CM | POA: Diagnosis not present

## 2017-03-17 DIAGNOSIS — I2510 Atherosclerotic heart disease of native coronary artery without angina pectoris: Secondary | ICD-10-CM | POA: Diagnosis not present

## 2017-03-17 DIAGNOSIS — G2581 Restless legs syndrome: Secondary | ICD-10-CM | POA: Insufficient documentation

## 2017-03-17 LAB — COMPREHENSIVE METABOLIC PANEL
ALBUMIN: 3.7 g/dL (ref 3.5–5.0)
ALT: 34 U/L (ref 14–54)
AST: 43 U/L — AB (ref 15–41)
Alkaline Phosphatase: 84 U/L (ref 38–126)
Anion gap: 9 (ref 5–15)
BUN: 18 mg/dL (ref 6–20)
CHLORIDE: 97 mmol/L — AB (ref 101–111)
CO2: 30 mmol/L (ref 22–32)
CREATININE: 1.34 mg/dL — AB (ref 0.44–1.00)
Calcium: 8.7 mg/dL — ABNORMAL LOW (ref 8.9–10.3)
GFR calc Af Amer: 47 mL/min — ABNORMAL LOW (ref >60)
GFR calc non Af Amer: 41 mL/min — ABNORMAL LOW (ref >60)
GLUCOSE: 119 mg/dL — AB (ref 65–99)
POTASSIUM: 3.6 mmol/L (ref 3.5–5.1)
Sodium: 136 mmol/L (ref 135–145)
Total Bilirubin: 1.3 mg/dL — ABNORMAL HIGH (ref 0.3–1.2)
Total Protein: 7 g/dL (ref 6.5–8.1)

## 2017-03-17 LAB — CBC WITH DIFFERENTIAL/PLATELET
BASOS ABS: 0 10*3/uL (ref 0.0–0.1)
Basophils Relative: 0 %
EOS PCT: 0 %
Eosinophils Absolute: 0 10*3/uL (ref 0.0–0.7)
HEMATOCRIT: 32 % — AB (ref 36.0–46.0)
Hemoglobin: 10 g/dL — ABNORMAL LOW (ref 12.0–15.0)
LYMPHS PCT: 18 %
Lymphs Abs: 1.6 10*3/uL (ref 0.7–4.0)
MCH: 28.7 pg (ref 26.0–34.0)
MCHC: 31.3 g/dL (ref 30.0–36.0)
MCV: 91.7 fL (ref 78.0–100.0)
MONO ABS: 0.9 10*3/uL (ref 0.1–1.0)
Monocytes Relative: 10 %
NEUTROS ABS: 6.5 10*3/uL (ref 1.7–7.7)
Neutrophils Relative %: 72 %
PLATELETS: 227 10*3/uL (ref 150–400)
RBC: 3.49 MIL/uL — AB (ref 3.87–5.11)
RDW: 14.4 % (ref 11.5–15.5)
WBC: 9.1 10*3/uL (ref 4.0–10.5)

## 2017-03-17 LAB — BRAIN NATRIURETIC PEPTIDE: B Natriuretic Peptide: 435.7 pg/mL — ABNORMAL HIGH (ref 0.0–100.0)

## 2017-03-17 NOTE — ED Triage Notes (Signed)
Pt to ED with c/o swelling in lower ext.  Also c/o productive cough.  Pt st's she went to her MD but they would not give her any cough meds with codeine in it.  Pt st's the only cough medicine that works is something with codeine in it.  Pt also st's she has been to [facility] for same but they would not give her anything.

## 2017-03-17 NOTE — ED Notes (Signed)
Pt in lobby eating potato chips at this time

## 2017-03-18 ENCOUNTER — Encounter (HOSPITAL_COMMUNITY): Payer: Self-pay | Admitting: General Practice

## 2017-03-18 ENCOUNTER — Observation Stay (HOSPITAL_COMMUNITY)
Admission: EM | Admit: 2017-03-18 | Discharge: 2017-03-19 | Disposition: A | Discharge status: Home / Self Care | Payer: Medicare HMO | Attending: Family Medicine | Admitting: Family Medicine

## 2017-03-18 ENCOUNTER — Other Ambulatory Visit: Payer: Self-pay

## 2017-03-18 DIAGNOSIS — I130 Hypertensive heart and chronic kidney disease with heart failure and stage 1 through stage 4 chronic kidney disease, or unspecified chronic kidney disease: Secondary | ICD-10-CM | POA: Diagnosis not present

## 2017-03-18 DIAGNOSIS — Z952 Presence of prosthetic heart valve: Secondary | ICD-10-CM

## 2017-03-18 DIAGNOSIS — N183 Chronic kidney disease, stage 3 (moderate): Secondary | ICD-10-CM | POA: Diagnosis not present

## 2017-03-18 DIAGNOSIS — G2581 Restless legs syndrome: Secondary | ICD-10-CM | POA: Diagnosis not present

## 2017-03-18 DIAGNOSIS — I4891 Unspecified atrial fibrillation: Secondary | ICD-10-CM | POA: Diagnosis not present

## 2017-03-18 DIAGNOSIS — Z951 Presence of aortocoronary bypass graft: Secondary | ICD-10-CM | POA: Diagnosis not present

## 2017-03-18 DIAGNOSIS — J441 Chronic obstructive pulmonary disease with (acute) exacerbation: Secondary | ICD-10-CM | POA: Diagnosis not present

## 2017-03-18 DIAGNOSIS — I350 Nonrheumatic aortic (valve) stenosis: Secondary | ICD-10-CM | POA: Diagnosis not present

## 2017-03-18 DIAGNOSIS — I5033 Acute on chronic diastolic (congestive) heart failure: Secondary | ICD-10-CM | POA: Diagnosis not present

## 2017-03-18 DIAGNOSIS — K219 Gastro-esophageal reflux disease without esophagitis: Secondary | ICD-10-CM | POA: Diagnosis not present

## 2017-03-18 DIAGNOSIS — Z7901 Long term (current) use of anticoagulants: Secondary | ICD-10-CM | POA: Diagnosis not present

## 2017-03-18 DIAGNOSIS — I2510 Atherosclerotic heart disease of native coronary artery without angina pectoris: Secondary | ICD-10-CM | POA: Diagnosis not present

## 2017-03-18 HISTORY — DX: Acute on chronic diastolic (congestive) heart failure: I50.33

## 2017-03-18 LAB — TROPONIN I

## 2017-03-18 LAB — PROTIME-INR
INR: 1.81
PROTHROMBIN TIME: 21.3 s — AB (ref 11.4–15.2)

## 2017-03-18 MED ORDER — LORATADINE 10 MG PO TABS
10.0000 mg | ORAL_TABLET | Freq: Every day | ORAL | Status: DC
  Administered 2017-03-18 – 2017-03-19 (×2): 10 mg via ORAL
  Filled 2017-03-18 (×2): qty 1

## 2017-03-18 MED ORDER — ROSUVASTATIN CALCIUM 10 MG PO TABS
10.0000 mg | ORAL_TABLET | Freq: Every day | ORAL | Status: DC
  Administered 2017-03-18: 10 mg via ORAL
  Filled 2017-03-18: qty 1

## 2017-03-18 MED ORDER — HYDROCODONE-ACETAMINOPHEN 10-325 MG PO TABS
1.0000 | ORAL_TABLET | Freq: Every day | ORAL | Status: DC
  Administered 2017-03-18: 1 via ORAL
  Filled 2017-03-18: qty 1

## 2017-03-18 MED ORDER — PANTOPRAZOLE SODIUM 40 MG PO TBEC
40.0000 mg | DELAYED_RELEASE_TABLET | Freq: Every day | ORAL | Status: DC
  Administered 2017-03-18 – 2017-03-19 (×2): 40 mg via ORAL
  Filled 2017-03-18 (×2): qty 1

## 2017-03-18 MED ORDER — SODIUM CHLORIDE 0.9% FLUSH: 3.0000 mL | INTRAVENOUS | Status: DC | PRN

## 2017-03-18 MED ORDER — RAMIPRIL 2.5 MG PO CAPS
2.5000 mg | ORAL_CAPSULE | Freq: Every day | ORAL | Status: DC
  Administered 2017-03-18 – 2017-03-19 (×2): 2.5 mg via ORAL
  Filled 2017-03-18 (×2): qty 1

## 2017-03-18 MED ORDER — ALLOPURINOL 100 MG PO TABS
100.0000 mg | ORAL_TABLET | Freq: Every day | ORAL | Status: DC
  Administered 2017-03-18 – 2017-03-19 (×2): 100 mg via ORAL
  Filled 2017-03-18 (×2): qty 1

## 2017-03-18 MED ORDER — ALBUTEROL SULFATE (2.5 MG/3ML) 0.083% IN NEBU: 2.5000 mg | INHALATION_SOLUTION | Freq: Four times a day (QID) | RESPIRATORY_TRACT | Status: DC | PRN

## 2017-03-18 MED ORDER — METOPROLOL SUCCINATE ER 50 MG PO TB24
50.0000 mg | ORAL_TABLET | Freq: Every day | ORAL | Status: DC
  Administered 2017-03-18: 50 mg via ORAL
  Filled 2017-03-18 (×2): qty 1

## 2017-03-18 MED ORDER — SODIUM CHLORIDE 0.9 % IV SOLN: 250.0000 mL | INTRAVENOUS | Status: DC | PRN

## 2017-03-18 MED ORDER — METHYLPREDNISOLONE SODIUM SUCC 125 MG IJ SOLR
125.0000 mg | Freq: Once | INTRAMUSCULAR | Status: AC
  Administered 2017-03-18: 125 mg via INTRAVENOUS
  Filled 2017-03-18: qty 2

## 2017-03-18 MED ORDER — ACETAMINOPHEN 325 MG PO TABS: 325.0000 mg | ORAL_TABLET | Freq: Four times a day (QID) | ORAL | Status: DC | PRN

## 2017-03-18 MED ORDER — CALCIUM CARBONATE 1250 (500 CA) MG PO TABS
1250.0000 mg | ORAL_TABLET | Freq: Two times a day (BID) | ORAL | Status: DC
  Administered 2017-03-18 – 2017-03-19 (×3): 1250 mg via ORAL
  Filled 2017-03-18 (×3): qty 1

## 2017-03-18 MED ORDER — ADULT MULTIVITAMIN W/MINERALS CH
1.0000 | ORAL_TABLET | Freq: Every day | ORAL | Status: DC
  Administered 2017-03-18 – 2017-03-19 (×2): 1 via ORAL
  Filled 2017-03-18 (×2): qty 1

## 2017-03-18 MED ORDER — ALPRAZOLAM 0.5 MG PO TABS
1.0000 mg | ORAL_TABLET | Freq: Three times a day (TID) | ORAL | Status: DC | PRN
  Administered 2017-03-18 – 2017-03-19 (×2): 1 mg via ORAL
  Filled 2017-03-18 (×2): qty 2

## 2017-03-18 MED ORDER — CYCLOBENZAPRINE HCL 10 MG PO TABS
10.0000 mg | ORAL_TABLET | Freq: Every day | ORAL | Status: DC
  Administered 2017-03-18: 10 mg via ORAL
  Filled 2017-03-18: qty 1

## 2017-03-18 MED ORDER — ROPINIROLE HCL 1 MG PO TABS
6.0000 mg | ORAL_TABLET | Freq: Every day | ORAL | Status: DC
  Administered 2017-03-18: 6 mg via ORAL
  Filled 2017-03-18: qty 6

## 2017-03-18 MED ORDER — ACETAMINOPHEN 325 MG PO TABS
650.0000 mg | ORAL_TABLET | ORAL | Status: DC | PRN
  Administered 2017-03-19: 650 mg via ORAL
  Filled 2017-03-18: qty 2

## 2017-03-18 MED ORDER — ONDANSETRON HCL 4 MG/2ML IJ SOLN: 4.0000 mg | Freq: Four times a day (QID) | INTRAMUSCULAR | Status: DC | PRN

## 2017-03-18 MED ORDER — IPRATROPIUM-ALBUTEROL 0.5-2.5 (3) MG/3ML IN SOLN
3.0000 mL | Freq: Once | RESPIRATORY_TRACT | Status: AC
  Administered 2017-03-18: 3 mL via RESPIRATORY_TRACT
  Filled 2017-03-18: qty 3

## 2017-03-18 MED ORDER — WARFARIN - PHARMACIST DOSING INPATIENT: Freq: Every day | Status: DC

## 2017-03-18 MED ORDER — FUROSEMIDE 10 MG/ML IJ SOLN
80.0000 mg | Freq: Once | INTRAMUSCULAR | Status: AC
  Administered 2017-03-18: 80 mg via INTRAVENOUS
  Filled 2017-03-18: qty 8

## 2017-03-18 MED ORDER — LEVOTHYROXINE SODIUM 50 MCG PO TABS
50.0000 ug | ORAL_TABLET | Freq: Every day | ORAL | Status: DC
  Administered 2017-03-18 – 2017-03-19 (×2): 50 ug via ORAL
  Filled 2017-03-18 (×2): qty 1

## 2017-03-18 MED ORDER — WARFARIN SODIUM 2 MG PO TABS
4.0000 mg | ORAL_TABLET | Freq: Once | ORAL | Status: AC
  Administered 2017-03-18: 4 mg via ORAL
  Filled 2017-03-18: qty 2

## 2017-03-18 MED ORDER — FUROSEMIDE 10 MG/ML IJ SOLN
80.0000 mg | Freq: Two times a day (BID) | INTRAMUSCULAR | Status: DC
  Administered 2017-03-18: 80 mg via INTRAVENOUS
  Filled 2017-03-18 (×2): qty 8

## 2017-03-18 MED ORDER — SODIUM CHLORIDE 0.9% FLUSH
3.0000 mL | Freq: Two times a day (BID) | INTRAVENOUS | Status: DC
  Administered 2017-03-18 – 2017-03-19 (×3): 3 mL via INTRAVENOUS

## 2017-03-18 NOTE — Progress Notes (Signed)
Patient seen and examined at bedside, patient admitted after midnight, please see earlier detailed admission note by Etta Quill, DO. Briefly, patient presented with CHF exacerbation likely secondary to dietary indiscretion. Improvement with IV diuresis.   Cordelia Poche, MD [facility] 03/18/2017, 1:32 PM Pager: [phone]

## 2017-03-18 NOTE — Progress Notes (Signed)
ANTICOAGULATION CONSULT NOTE - Follow Up Consult  Pharmacy Consult for Coumadin Indication: atrial fibrillation and mehcanical aortic valve  Patient Measurements: Height: 5\' 8"  (172.7 cm) Weight: 211 lb 11.2 oz (96 kg) IBW/kg (Calculated) : 63.9  Labs:  Recent Labs  03/17/17 2058 03/18/17 0108  HGB 10.0*  --   HCT 32.0*  --   PLT 227  --   LABPROT  --  21.3*  INR  --  1.81  CREATININE 1.34*  --   TROPONINI  --  <0.03    Estimated Creatinine Clearance: 50.7 mL/min (A) (by C-G formula based on SCr of 1.34 mg/dL (H)).  Assessment:  66 yr old female on Coumadin for mechanical atrial valve and afib.  INR 1.81 on admit last night.  Patient reports that she did not miss any doses, other than skipping dose on 03/03/17 when INR was 3.5.  Regimen adjusted that day, from 2 mg all days except 4 mg on Sundays, to 2 mg daily.  Goal of Therapy:  INR 2-3 per outpatient goal Monitor platelets by anticoagulation protocol: Yes   Plan:   Coumadin 4 mg x 1 today as planned. To be given shortly, rather than waiting til 6pm, since INR is below goal.  Discussed with patient.   Daily PT/INR.  Arty Baumgartner, [place] Pager: [phone] 03/18/2017,11:57 AM

## 2017-03-18 NOTE — H&P (Signed)
History and Physical    [name] FGH:[id number] DOB: 1951/09/27 DOA: 03/18/2017  PCP: Octavio Graves, DO  Patient coming from: Home  I have personally briefly reviewed patient's old medical records in [place]  Chief Complaint: Cough, SOB  HPI: [name] is a 66 y.o. female with medical history significant of CHF, A.Fib, possibly COPD, mechanical AVR in 2007, asthma.  Patient presents to the ED with c/o persistent cough for past week.  Some associated dyspnea, wheezing, acute on chronic BLE swelling.  SOB is worse laying flat.  Albuterol inhaler hasnt provided significant relief.  Weight has increased from 200 lbs to 216 lbs over the last week.  Recently switched from [facility] to lasix 80mg  BID after recent hospital stay for CHF (in Feb).  She does not smoke.  Without my asking the patient or even bringing it up, she does suggest that she likely has too much salt in her diet.   ED Course: Given 80 IV lasix with good UOP.  Also given solumedrol and breathing treatments.   Review of Systems: As per HPI otherwise 10 point review of systems negative.   Past Medical History:  Diagnosis Date  . Anxiety disorder   . Aortic stenosis    Status post St. Jude mechanical AVR 2007  . Asthma   . Atrial fibrillation ([place])   . Carcinoid tumor of colon 2007  . Coronary atherosclerosis of native coronary artery    Status post CABG 2007  . GERD (gastroesophageal reflux disease)   . History of colonoscopy 2003   Dr. Gala Romney - normal  . Hyperlipidemia   . Macromastia   . Pedal edema    Bilateral, chronic  . Peptic stricture of esophagus 10/04/2010   GE junction on last EGD by Dr. Gala Romney, benign biopsies  . RLS (restless legs syndrome)   . Schatzki's ring     Past Surgical History:  Procedure Laterality Date  . ABDOMINAL HYSTERECTOMY    . AORTIC VALVE REPLACEMENT  2007   #25 mm St. 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  APPENDECTOMY  2007  . BACK SURGERY     lumbar 4 and 5   . BIOPSY  02/05/2012   RMR:Two tongues of salmon-colored epithelium distal esophagus, very short-segment Barrett's s/p bx/Small hiatal hernia, otherwise normal stomach, D1, D2. Status post esophageal dilation. Biopsy showed GERD.  . Breast cyst removed     bilateral  . BREAST REDUCTION SURGERY    . CESAREAN SECTION    . 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 COLONOSCOPY  02/05/2012   [facility]:LFYBOF rectum, sigmoid diverticulosis,descending colon polyp , tubular adenoma  . CORONARY ARTERY BYPASS GRAFT     06/2006 - RIMA to RCA, SVG to RCA  . ESOPHAGOGASTRODUODENOSCOPY  10/03/2010   Dr. Gala Romney- schatzki's ring, shoft peptic stricture at GE junction.  Marland Kitchen FOOT SURGERY     bilateral bunionectomy  . HERNIA REPAIR     with mesh  . LAPAROTOMY  2007   small bowel resection secondary to small bowel obstruction  . MALONEY DILATION  02/05/2012   Procedure: Venia Minks DILATION;  Surgeon: Daneil Dolin, MD;  Location: [facility];  Service: Endoscopy;  Laterality: N/A;  78mm   . Teeth removal       reports that she quit smoking about 28 years ago. Her smoking use included Cigarettes. She has a 20.00 pack-year smoking history. She has never used smokeless  tobacco. She reports that she does not drink alcohol or use drugs.  Allergies  Allergen Reactions  . Penicillins Hives, Itching, Swelling and Rash    Has patient had a PCN reaction causing immediate rash, facial/tongue/throat swelling, SOB or lightheadedness with hypotension: Yes Has patient had a PCN reaction causing severe rash involving mucus membranes or skin necrosis: Yes Has patient had a PCN reaction that required hospitalization: unknown Has patient had a PCN reaction occurring within the last 10 years: No If all of the above answers are "NO", then may proceed with Cephalosporin use.    Throat swelling    Family History  Problem Relation Age of Onset  . Stroke Mother    . Cancer Father   . Anesthesia problems Neg Hx   . Hypotension Neg Hx   . Malignant hyperthermia Neg Hx   . Pseudochol deficiency Neg Hx      Prior to Admission medications   Medication Sig Start Date End Date Taking? Authorizing Provider  acetaminophen (TYLENOL) 325 MG tablet Take 325 mg by mouth every 6 (six) hours as needed for mild pain or moderate pain.    [provider]  albuterol (PROVENTIL HFA;VENTOLIN HFA) 108 (90 BASE) MCG/ACT inhaler Inhale 1-2 puffs into the lungs every 6 (six) hours as needed for wheezing or shortness of breath.     [provider]  allopurinol (ZYLOPRIM) 100 MG tablet Take 100 mg by mouth daily.    [provider]  ALPRAZolam Duanne Moron) 1 MG tablet Take 1 mg by mouth 3 (three) times daily as needed for anxiety.  09/20/16   [provider]  calcium carbonate (OS-CAL) 600 MG TABS tablet Take 600 mg by mouth 2 (two) times daily.    [provider]  cetirizine (ZYRTEC) 10 MG tablet Take 10 mg by mouth daily.      [provider]  CRESTOR 10 MG tablet Take 10 mg by mouth at bedtime.  12/02/13   [provider]  cyclobenzaprine (FLEXERIL) 10 MG tablet Take 10 mg by mouth at bedtime.     [provider]  HYDROcodone-acetaminophen (NORCO) 10-325 MG per tablet Take 1 tablet by mouth at bedtime.     [provider]  levothyroxine (SYNTHROID, LEVOTHROID) 50 MCG tablet Take 50 mcg by mouth every morning.     [provider]  Magnesium Chloride (MAG-DELAY) 535 (64 MG) MG TBCR Take 1 tablet by mouth at bedtime.     [provider]  metoprolol succinate (TOPROL-XL) 50 MG 24 hr tablet Take 1 tablet (50 mg total) by mouth daily. 12/20/16   Murlean Iba, MD  Multiple Vitamin (MULTIVITAMIN WITH MINERALS) TABS tablet Take 1 tablet by mouth daily.    [provider]  omeprazole (PRILOSEC) 20 MG capsule Take 20 mg by mouth daily.  02/22/13   [provider]  ramipril  (ALTACE) 2.5 MG capsule Take 2.5 mg by mouth daily.     [provider]  rOPINIRole (REQUIP) 3 MG tablet Take 6 mg by mouth at bedtime.     [provider]  temazepam (RESTORIL) 15 MG capsule Take 15 mg by mouth at bedtime as needed for sleep.     [provider]  torsemide (DEMADEX) 20 MG tablet Take 4 tablets (80 mg total) by mouth 2 (two) times daily. 03/05/17   Daleen Bo, MD  warfarin (COUMADIN) 2 MG tablet Takes one tablet (2mg ) everyday 03/03/17   Arnoldo Lenis, MD    Physical  Exam: Vitals:   03/18/17 0215 03/18/17 0230 03/18/17 0334 03/18/17 0400  BP: 105/79 (!) 108/95 106/84 110/78  Pulse: 88 77 78 68  Resp: 15 17 17  (!) 28  Temp:      TempSrc:      SpO2: 94% 98% 91% 92%  Weight:      Height:        Constitutional: NAD, calm, comfortable Eyes: PERRL, lids and conjunctivae normal ENMT: Mucous membranes are moist. Posterior pharynx clear of any exudate or lesions.Normal dentition.  Neck: normal, supple, no masses, no thyromegaly Respiratory: clear to auscultation bilaterally, no wheezing, no crackles. Normal respiratory effort. No accessory muscle use.  Cardiovascular: Regular rate and rhythm, no murmurs / rubs / gallops. No extremity edema. 2+ pedal pulses. No carotid bruits.  Abdomen: no tenderness, no masses palpated. No hepatosplenomegaly. Bowel sounds positive.  Musculoskeletal: no clubbing / cyanosis. No joint deformity upper and lower extremities. Good ROM, no contractures. Normal muscle tone.  Skin: no rashes, lesions, ulcers. No induration Neurologic: CN 2-12 grossly intact. Sensation intact, DTR normal. Strength 5/5 in all 4.  Psychiatric: Normal judgment and insight. Alert and oriented x 3. Normal mood.    Labs on Admission: I have personally reviewed following labs and imaging studies  CBC:  Recent Labs Lab 03/17/17 2058  WBC 9.1  NEUTROABS 6.5  HGB 10.0*  HCT 32.0*  MCV 91.7  PLT 253   Basic Metabolic  Panel:  Recent Labs Lab 03/17/17 2058  NA 136  K 3.6  CL 97*  CO2 30  GLUCOSE 119*  BUN 18  CREATININE 1.34*  CALCIUM 8.7*   GFR: Estimated Creatinine Clearance: 51 mL/min (A) (by C-G formula based on SCr of 1.34 mg/dL (H)). Liver Function Tests:  Recent Labs Lab 03/17/17 2058  AST 43*  ALT 34  ALKPHOS 84  BILITOT 1.3*  PROT 7.0  ALBUMIN 3.7   No results for input(s): LIPASE, AMYLASE in the last 168 hours. No results for input(s): AMMONIA in the last 168 hours. Coagulation Profile:  Recent Labs Lab 03/18/17 0108  INR 1.81   Cardiac Enzymes:  Recent Labs Lab 03/18/17 0108  TROPONINI <0.03   BNP (last 3 results) No results for input(s): PROBNP in the last 8760 hours. HbA1C: No results for input(s): HGBA1C in the last 72 hours. CBG: No results for input(s): GLUCAP in the last 168 hours. Lipid Profile: No results for input(s): CHOL, HDL, LDLCALC, TRIG, CHOLHDL, LDLDIRECT in the last 72 hours. Thyroid Function Tests: No results for input(s): TSH, T4TOTAL, FREET4, T3FREE, THYROIDAB in the last 72 hours. Anemia Panel: No results for input(s): VITAMINB12, FOLATE, FERRITIN, TIBC, IRON, RETICCTPCT in the last 72 hours. Urine analysis: No results found for: COLORURINE, APPEARANCEUR, LABSPEC, [place], GLUCOSEU, HGBUR, BILIRUBINUR, KETONESUR, PROTEINUR, UROBILINOGEN, NITRITE, LEUKOCYTESUR  Radiological Exams on Admission: Dg Chest 2 View  Result Date: 03/17/2017 CLINICAL DATA:  Chronic cough. Shortness of breath. Initial encounter. EXAM: CHEST  2 VIEW COMPARISON:  Chest radiograph performed 03/05/2017 FINDINGS: The lungs are well-aerated. Vascular congestion is noted. There is no evidence of focal opacification, pleural effusion or pneumothorax. The heart is mildly enlarged. The patient is status post median sternotomy. A valve replacement is noted. No acute osseous abnormalities are seen. IMPRESSION: Vascular congestion and mild cardiomegaly. Lungs remain grossly  clear. Electronically Signed   By: Garald Balding M.D.   On: 03/17/2017 21:48    EKG: Independently reviewed.  Assessment/Plan Principal Problem:   Acute on chronic diastolic CHF (congestive heart failure) (  [place]) Active Problems:   Aortic valve replaced   Long term current use of anticoagulant therapy   COPD exacerbation ([facility])    1. Acute on chronic diastolic CHF - 1. CHF pathway 2. Lasix 80mg  IV BID 3. Monitor UOP 4. Daily CBC 5. Patient with nl EF on prior echo. 2. Mechanical valve - continue coumadin 3. ? COPD - 1. Will hold off on further steroids for the moment, resume if breathing worsens 2. Adult wheeze protocol for breathing treatments 4. HTN - continue home BP meds  DVT prophylaxis: Coumadin Code Status: Full Family Communication: Family at bedside Disposition Plan: Home after admit Consults called: None Admission status: Place in [place], [place] [facility] Pager [phone]  If 7AM-7PM, please contact day team taking care of patient [email or web address] Password [email or web address]  03/18/2017, 4:57 AM

## 2017-03-18 NOTE — Progress Notes (Signed)
ANTICOAGULATION CONSULT NOTE - Initial Consult  Pharmacy Consult for Coumadin Indication: mechanical AVR and Afib  Allergies  Allergen Reactions  . Penicillins Hives, Itching, Swelling and Rash    Has patient had a PCN reaction causing immediate rash, facial/tongue/throat swelling, SOB or lightheadedness with hypotension: Yes Has patient had a PCN reaction causing severe rash involving mucus membranes or skin necrosis: Yes Has patient had a PCN reaction that required hospitalization: unknown Has patient had a PCN reaction occurring within the last 10 years: No If all of the above answers are "NO", then may proceed with Cephalosporin use.    Throat swelling    Patient Measurements: Height: 5\' 8"  (172.7 cm) Weight: 211 lb 11.2 oz (96 kg) IBW/kg (Calculated) : 63.9  Vital Signs: Temp: 97.5 F (36.4 C) (05/30 0636) Temp Source: Oral (05/30 0636) BP: 122/74 (05/30 0636) Pulse Rate: 81 (05/30 0636)  Labs:  Recent Labs  03/17/17 2058 03/18/17 0108  HGB 10.0*  --   HCT 32.0*  --   PLT 227  --   LABPROT  --  21.3*  INR  --  1.81  CREATININE 1.34*  --   TROPONINI  --  <0.03    Estimated Creatinine Clearance: 50.7 mL/min (A) (by C-G formula based on SCr of 1.34 mg/dL (H)).   Medical History: Past Medical History:  Diagnosis Date  . Anxiety disorder   . Aortic stenosis    Status post St. Jude mechanical AVR 2007  . Asthma   . Atrial fibrillation ([place])   . Carcinoid tumor of colon 2007  . Coronary atherosclerosis of native coronary artery    Status post CABG 2007  . GERD (gastroesophageal reflux disease)   . History of colonoscopy 2003   Dr. Gala Romney - normal  . Hyperlipidemia   . Macromastia   . Pedal edema    Bilateral, chronic  . Peptic stricture of esophagus 10/04/2010   GE junction on last EGD by Dr. Gala Romney, benign biopsies  . RLS (restless legs syndrome)   . Schatzki's ring     Medications:  Prescriptions Prior to Admission  Medication Sig Dispense  Refill Last Dose  . acetaminophen (TYLENOL) 325 MG tablet Take 325 mg by mouth every 6 (six) hours as needed for mild pain or moderate pain.   unk  . albuterol (PROVENTIL HFA;VENTOLIN HFA) 108 (90 BASE) MCG/ACT inhaler Inhale 1-2 puffs into the lungs every 6 (six) hours as needed for wheezing or shortness of breath.    unk  . allopurinol (ZYLOPRIM) 100 MG tablet Take 100 mg by mouth daily.   03/17/2017 at Unknown time  . ALPRAZolam (XANAX) 1 MG tablet Take 1 mg by mouth 3 (three) times daily as needed for anxiety.    03/17/2017 at Unknown time  . calcium carbonate (OS-CAL) 600 MG TABS tablet Take 600 mg by mouth 2 (two) times daily.   03/17/2017 at Unknown time  . cetirizine (ZYRTEC) 10 MG tablet Take 10 mg by mouth daily.     03/17/2017 at Unknown time  . CRESTOR 10 MG tablet Take 10 mg by mouth at bedtime.    03/17/2017 at Unknown time  . cyclobenzaprine (FLEXERIL) 10 MG tablet Take 10 mg by mouth at bedtime.    03/17/2017 at Unknown time  . furosemide (LASIX) 40 MG tablet Take 80 mg by mouth 2 (two) times daily.   03/17/2017 at Unknown time  . HYDROcodone-acetaminophen (NORCO) 10-325 MG per tablet Take 1 tablet by mouth at bedtime.  03/17/2017 at Unknown time  . levothyroxine (SYNTHROID, LEVOTHROID) 50 MCG tablet Take 50 mcg by mouth every morning.    03/17/2017 at Unknown time  . Magnesium Chloride (MAG-DELAY) 535 (64 MG) MG TBCR Take 1 tablet by mouth at bedtime.    03/17/2017 at Unknown time  . metoprolol succinate (TOPROL-XL) 50 MG 24 hr tablet Take 1 tablet (50 mg total) by mouth daily. 30 tablet 0 03/17/2017 at 0830  . Multiple Vitamin (MULTIVITAMIN WITH MINERALS) TABS tablet Take 1 tablet by mouth daily.   03/17/2017 at Unknown time  . omeprazole (PRILOSEC) 20 MG capsule Take 20 mg by mouth daily.    03/17/2017 at Unknown time  . ramipril (ALTACE) 2.5 MG capsule Take 2.5 mg by mouth daily.    03/17/2017 at Unknown time  . rOPINIRole (REQUIP) 3 MG tablet Take 6 mg by mouth at bedtime.    03/17/2017 at  Unknown time  . temazepam (RESTORIL) 15 MG capsule Take 15 mg by mouth at bedtime.    03/17/2017 at Unknown time  . warfarin (COUMADIN) 2 MG tablet Takes one tablet (2mg ) everyday (Patient taking differently: Take 2 mg by mouth daily. Takes one tablet (2mg ) everyday) 45 tablet 3 03/17/2017 at 1730    Assessment: 66 y.o. female admitted with CHF, h/o Afib/AVR, to continue Coumadin   Goal of Therapy:  INR 2.5-3.5 Monitor platelets by anticoagulation protocol: Yes   Plan:  Coumadin 4 mg today Daily INR  [name], [place] 03/18/2017,7:40 AM

## 2017-03-18 NOTE — ED Notes (Signed)
Pt ambulated in hallway. Pt's O2 sats dropped from 96 to 91% while ambulating. Pt had mild SHOB while ambulating. Steady gait noted.

## 2017-03-18 NOTE — Evaluation (Signed)
Physical Therapy Evaluation Patient Details Name: [name] MRN: [id number] DOB: Oct 13, 1951 Today's Date: 03/18/2017   History of Present Illness  Pt is a 66 y/o female admitted secondary to SOB, cough and CHF exacerbation. PMH including but not limited to a-fib, CAD s/p CABG in 2007, s/p AVR in 2007 and HLD.  Clinical Impression  Pt presented supine in bed with HOB elevated, awake and willing to participate in therapy session. Prior to admission, pt reported that she was independent with functional mobility and with ADLs. Pt ambulated in hallway on RA with SPO2 decreasing as low as 84% (primarily maintaining at 87-88%) but quickly rebounding to >90% with sitting rest break. When ambulating on 2L of supplemental O2, pt's SPO2 maintaining >94%. PT will continue to follow pt acutely to ensure a safe d/c home.    Follow Up Recommendations No PT follow up    Equipment Recommendations  None recommended by PT    Recommendations for Other Services       Precautions / Restrictions Precautions Precautions: None Precaution Comments: monitor SPO2 Restrictions Weight Bearing Restrictions: No      Mobility  Bed Mobility Overal bed mobility: Modified Independent                Transfers Overall transfer level: Needs assistance Equipment used: None Transfers: Sit to/from Stand Sit to Stand: Supervision         General transfer comment: supervision for safety  Ambulation/Gait Ambulation/Gait assistance: Supervision Ambulation Distance (Feet): 200 Feet Assistive device: None Gait Pattern/deviations: Step-through pattern;Decreased stride length;Wide base of support Gait velocity: decreased Gait velocity interpretation: Below normal speed for age/gender General Gait Details: no instability or LOB, supervision for safety. Pt ambulated on RA with SPO2 decreasing to as low as 84%  Science writer    Modified Rankin (Stroke Patients Only)       Balance Overall balance assessment: Needs assistance Sitting-balance support: Feet supported Sitting balance-Leahy Scale: Good     Standing balance support: During functional activity;No upper extremity supported Standing balance-Leahy Scale: Fair                               Pertinent Vitals/Pain Pain Assessment: No/denies pain    Home Living Family/patient expects to be discharged to:: Private residence Living Arrangements: Spouse/significant other Available Help at Discharge: Family;Available PRN/intermittently Type of Home: Mobile home Home Access: Stairs to enter Entrance Stairs-Rails: Right;Left Entrance Stairs-Number of Steps: 3 Home Layout: One level Home Equipment: Bedside commode;Shower seat;Cane - single point      Prior Function Level of Independence: Independent               Hand Dominance   Dominant Hand: Right    Extremity/Trunk Assessment   Upper Extremity Assessment Upper Extremity Assessment: Overall WFL for tasks assessed    Lower Extremity Assessment Lower Extremity Assessment: Overall WFL for tasks assessed       Communication   Communication: No difficulties  Cognition Arousal/Alertness: Awake/alert Behavior During Therapy: WFL for tasks assessed/performed Overall Cognitive Status: Within Functional Limits for tasks assessed                                        General Comments      Exercises     Assessment/Plan  PT Assessment Patient needs continued PT services  PT Problem List Decreased activity tolerance;Decreased balance;Decreased mobility;Decreased coordination;Cardiopulmonary status limiting activity       PT Treatment Interventions DME instruction;Gait training;Stair training;Functional mobility training;Therapeutic activities;Therapeutic exercise;Balance training;Neuromuscular re-education;Patient/family education    PT Goals (Current goals can be found in the Care Plan section)   Acute Rehab PT Goals Patient Stated Goal: return home and decrease swelling in legs PT Goal Formulation: With patient Time For Goal Achievement: 04/01/17 Potential to Achieve Goals: Good    Frequency Min 3X/week   Barriers to discharge        Co-evaluation               AM-PAC PT "6 Clicks" Daily Activity  Outcome Measure Difficulty turning over in bed (including adjusting bedclothes, sheets and blankets)?: None Difficulty moving from lying on back to sitting on the side of the bed? : None Difficulty sitting down on and standing up from a chair with arms (e.g., wheelchair, bedside commode, etc,.)?: None Help needed moving to and from a bed to chair (including a wheelchair)?: None Help needed walking in hospital room?: None Help needed climbing 3-5 steps with a railing? : A Little 6 Click Score: 23    End of Session   Activity Tolerance: Patient tolerated treatment well Patient left: in bed;with call bell/phone within reach;with family/visitor present Nurse Communication: Mobility status PT Visit Diagnosis: Other abnormalities of gait and mobility (R26.89)    Time: 1600-1630 PT Time Calculation (min) (ACUTE ONLY): 30 min   Charges:   PT Evaluation $PT Eval Moderate Complexity: 1 Procedure PT Treatments $Gait Training: 8-22 mins   PT G Codes:   PT G-Codes **NOT FOR INPATIENT CLASS** Functional Assessment Tool Used: AM-PAC 6 Clicks Basic Mobility;Clinical judgement Functional Limitation: Mobility: Walking and moving around Mobility: Walking and Moving Around Current Status (K9179): At least 1 percent but less than 20 percent impaired, limited or restricted Mobility: Walking and Moving Around Goal Status [phone]): 0 percent impaired, limited or restricted    [facility], [place], DPT [place] 03/18/2017, 5:14 PM

## 2017-03-18 NOTE — ED Provider Notes (Signed)
[facility] DEPT Provider Note   CSN: [id number] Arrival date & time: 03/17/17  2037  By signing my name below, I, [name], attest that this documentation has been prepared under the direction and in the presence of physician practitioner, [name], Amy Main, MD. Electronically Signed: Dora Mendez, Scribe. 03/18/2017. 1:34 AM.  History   Chief Complaint Chief Complaint  Patient presents with  . Cough  . Shortness of Breath   The history is provided by the patient. No language interpreter was used.    HPI Comments: [name] is a 66 y.o. female with PMHx including A-Fib, CAD, HLD, CHF, COPD, and asthma who presents to the Emergency Department complaining of a persistent cough intermittently productive of brown sputum for one week. She reports some associated dyspnea, wheezing, acute on chronic bilateral lower leg swelling, and several episodes of post-tussive emesis. She states her dyspnea is worse with lying flat. She has been using her albuterol inhaler without significant improvement of her respiratory symptoms. Patient notes her weight has increased from 200 lbs to 216 lbs over the last week. She was recently switched from Demadex to Lasix 80mg  BID after a hospitalization for CHF and has taken it as instructed without significant improvement of her leg swelling. She was evaluated here on 5/17 for leg swelling and states her current leg swelling is more significant than it was during that visit. No oxygen therapy at home. She has a mechanical valve and is on Coumadin which she has taken as instructed. No h/o coronary stent placement. She denies chest pain, fevers, chills, or any other associated symptoms.  Past Medical History:  Diagnosis Date  . Anxiety disorder   . Aortic stenosis    Status post St. Jude mechanical AVR 2007  . Asthma   . Atrial fibrillation ([place])   . Carcinoid tumor of colon 2007  . Coronary atherosclerosis of native coronary artery    Status post  CABG 2007  . GERD (gastroesophageal reflux disease)   . History of colonoscopy 2003   Dr. Gala Mendez - normal  . Hyperlipidemia   . Macromastia   . Pedal edema    Bilateral, chronic  . Peptic stricture of esophagus 10/04/2010   GE junction on last EGD by Dr. Gala Mendez, benign biopsies  . RLS (restless legs syndrome)   . Schatzki's ring     Patient Active Problem List   Diagnosis Date Noted  . Acute on chronic diastolic CHF (congestive heart failure) ([place]) 12/17/2016  . COPD exacerbation ([place]) 12/17/2016  . Bilateral leg edema 07/05/2014  . Encounter for therapeutic drug monitoring 11/15/2013  . Chronic venous insufficiency 08/26/2011  . Long term current use of anticoagulant therapy 01/11/2011  . GERD 09/16/2010  . Aortic valve replaced 06/05/2009  . HYPERLIPIDEMIA-MIXED 06/04/2009  . Coronary atherosclerosis of native coronary artery 06/04/2009  . Atrial fibrillation ([place]) 06/04/2009    Past Surgical History:  Procedure Laterality Date  . ABDOMINAL HYSTERECTOMY    . AORTIC VALVE REPLACEMENT  2007   #25 mm St. 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 APPENDECTOMY  2007  . BACK SURGERY     lumbar 4 and 5   . BIOPSY  02/05/2012   RMR:Two tongues of salmon-colored epithelium distal esophagus, very short-segment Barrett's s/p bx/Small hiatal hernia, otherwise normal stomach, D1, D2. Status post esophageal dilation. Biopsy showed GERD.  . Breast cyst removed     bilateral  . BREAST REDUCTION SURGERY    . CESAREAN SECTION    .  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 COLONOSCOPY  02/05/2012   AVW:[id number] rectum, sigmoid diverticulosis,descending colon polyp , tubular adenoma  . CORONARY ARTERY BYPASS GRAFT     06/2006 - RIMA to RCA, SVG to RCA  . ESOPHAGOGASTRODUODENOSCOPY  10/03/2010   Dr. Gala Mendez- schatzki's ring, shoft peptic stricture at GE junction.  [name] FOOT SURGERY     bilateral bunionectomy  . HERNIA REPAIR      with mesh  . LAPAROTOMY  2007   small bowel resection secondary to small bowel obstruction  . MALONEY DILATION  02/05/2012   Procedure: Venia Minks DILATION;  Surgeon: Daneil Dolin, MD;  Location: [facility];  Service: Endoscopy;  Laterality: N/A;  25mm   . Teeth removal      OB History    No data available       Home Medications    Prior to Admission medications   Medication Sig Start Date End Date Taking? Authorizing Provider  acetaminophen (TYLENOL) 325 MG tablet Take 325 mg by mouth every 6 (six) hours as needed for mild pain or moderate pain.    [provider]  albuterol (PROVENTIL HFA;VENTOLIN HFA) 108 (90 BASE) MCG/ACT inhaler Inhale 1-2 puffs into the lungs every 6 (six) hours as needed for wheezing or shortness of breath.     [provider]  allopurinol (ZYLOPRIM) 100 MG tablet Take 100 mg by mouth daily.    [provider]  ALPRAZolam Duanne Moron) 1 MG tablet Take 1 mg by mouth 3 (three) times daily as needed for anxiety.  09/20/16   [provider]  azithromycin (ZITHROMAX) 250 MG tablet Take 2 tabs PO x 1 dose, then 1 tab PO QD x 4 days Patient not taking: Reported on 03/05/2017 12/20/16   Amy Brakeman L, MD  calcium carbonate (OS-CAL) 600 MG TABS tablet Take 600 mg by mouth 2 (two) times daily.    [provider]  cetirizine (ZYRTEC) 10 MG tablet Take 10 mg by mouth daily.      [provider]  chlorpheniramine-HYDROcodone (TUSSIONEX PENNKINETIC ER) 10-8 MG/5ML SUER Take 5 mLs by mouth every 12 (twelve) hours as needed for cough. Patient not taking: Reported on 03/05/2017 12/20/16   Amy Iba, MD  CRESTOR 10 MG tablet Take 10 mg by mouth at bedtime.  12/02/13   [provider]  cyclobenzaprine (FLEXERIL) 10 MG tablet Take 10 mg by mouth at bedtime.     [provider]  HYDROcodone-acetaminophen (NORCO) 10-325 MG per tablet Take 1 tablet by mouth at bedtime.     [provider]  levothyroxine  (SYNTHROID, LEVOTHROID) 50 MCG tablet Take 50 mcg by mouth every morning.     [provider]  Magnesium Chloride (MAG-DELAY) 535 (64 MG) MG TBCR Take 1 tablet by mouth at bedtime.     [provider]  metoprolol succinate (TOPROL-XL) 50 MG 24 hr tablet Take 1 tablet (50 mg total) by mouth daily. 12/20/16   Amy Iba, MD  Multiple Vitamin (MULTIVITAMIN WITH MINERALS) TABS tablet Take 1 tablet by mouth daily.    [provider]  omeprazole (PRILOSEC) 20 MG capsule Take 20 mg by mouth daily.  02/22/13   [provider]  ramipril (ALTACE) 2.5 MG capsule Take 2.5 mg by mouth daily.     [provider]  rOPINIRole (REQUIP) 3 MG tablet Take 6 mg by mouth at bedtime.     [provider]  temazepam (RESTORIL)  15 MG capsule Take 15 mg by mouth at bedtime as needed for sleep.     [provider]  torsemide (DEMADEX) 20 MG tablet Take 4 tablets (80 mg total) by mouth 2 (two) times daily. 03/05/17   Daleen Bo, MD  warfarin (COUMADIN) 2 MG tablet Takes one tablet (2mg ) everyday 03/03/17   Arnoldo Lenis, MD    Family History Family History  Problem Relation Age of Onset  . Stroke Mother   . Cancer Father   . Anesthesia problems Neg Hx   . Hypotension Neg Hx   . Malignant hyperthermia Neg Hx   . Pseudochol deficiency Neg Hx     Social History Social History  Substance Use Topics  . Smoking status: Former Smoker    Packs/day: 1.00    Years: 20.00    Types: Cigarettes    Quit date: 10/20/1988  . Smokeless tobacco: Never Used  . Alcohol use No     Allergies   Penicillins   Review of Systems Review of Systems All other systems reviewed and are negative for acute change except as noted in the HPI. Physical Exam Updated Vital Signs BP (!) 101/45 (BP Location: Right Arm)   Pulse 74   Temp 97.8 F (36.6 C) (Oral)   Resp 15   Ht 5\' 8"  (1.727 m)   Wt 214 lb (97.1 kg)   SpO2 100%   BMI 32.54 kg/m   Physical Exam    Constitutional: She is oriented to person, place, and time. She appears well-developed and well-nourished. No distress.  HENT:  Head: Normocephalic and atraumatic.  Mouth/Throat: Oropharynx is clear and moist. No oropharyngeal exudate.  Eyes: Conjunctivae and EOM are normal. Pupils are equal, round, and reactive to light.  Neck: Normal range of motion. Neck supple.  No meningismus.  Cardiovascular: Normal rate, regular rhythm, normal heart sounds and intact distal pulses.   No murmur heard. Pulmonary/Chest: Effort normal. No respiratory distress. She has decreased breath sounds. She has wheezes. She has rhonchi.  Decreased breath sounds at the bases with scattered rhonchi and expiratory wheezing. Frequent dry cough.   Abdominal: Soft. There is no tenderness. There is no rebound and no guarding.  Musculoskeletal: Normal range of motion. She exhibits edema. She exhibits no tenderness.  2+ edema to the mid-thighs bilaterally. Chronic venous stasis changes with mild erythema.  Neurological: She is alert and oriented to person, place, and time. No cranial nerve deficit. She exhibits normal muscle tone. Coordination normal.  No ataxia on finger to nose bilaterally. No pronator drift. 5/5 strength throughout. CN 2-12 intact.Equal grip strength. Sensation intact.   Skin: Skin is warm.  Psychiatric: She has a normal mood and affect. Her behavior is normal.  Nursing note and vitals reviewed.  ED Treatments / Results  Labs (all labs ordered are listed, but only abnormal results are displayed) Labs Reviewed  CBC WITH DIFFERENTIAL/PLATELET - Abnormal; Notable for the following:       Result Value   RBC 3.49 (*)    Hemoglobin 10.0 (*)    HCT 32.0 (*)    All other components within normal limits  COMPREHENSIVE METABOLIC PANEL - Abnormal; Notable for the following:    Chloride 97 (*)    Glucose, Bld 119 (*)    Creatinine, Ser 1.34 (*)    Calcium 8.7 (*)    AST 43 (*)    Total Bilirubin 1.3 (*)     GFR calc non Af Amer 41 (*)  GFR calc Af Amer 47 (*)    All other components within normal limits  BRAIN NATRIURETIC PEPTIDE - Abnormal; Notable for the following:    B Natriuretic Peptide 435.7 (*)    All other components within normal limits  PROTIME-INR - Abnormal; Notable for the following:    Prothrombin Time 21.3 (*)    All other components within normal limits  TROPONIN I  BASIC METABOLIC PANEL  PROTIME-INR    EKG  EKG Interpretation None       Radiology Dg Chest 2 View  Result Date: 03/17/2017 CLINICAL DATA:  Chronic cough. Shortness of breath. Initial encounter. EXAM: CHEST  2 VIEW COMPARISON:  Chest radiograph performed 03/05/2017 FINDINGS: The lungs are well-aerated. Vascular congestion is noted. There is no evidence of focal opacification, pleural effusion or pneumothorax. The heart is mildly enlarged. The patient is status post median sternotomy. A valve replacement is noted. No acute osseous abnormalities are seen. IMPRESSION: Vascular congestion and mild cardiomegaly. Lungs remain grossly clear. Electronically Signed   By: Garald Balding M.D.   On: 03/17/2017 21:48    Procedures Procedures (including critical care time)  DIAGNOSTIC STUDIES: Oxygen Saturation is 100% on RA, normal by my interpretation.    COORDINATION OF CARE: 1:33 AM Discussed treatment plan with pt at bedside and pt agreed to plan.  Medications Ordered in ED Medications  ipratropium-albuterol (DUONEB) 0.5-2.5 (3) MG/3ML nebulizer solution 3 mL (not administered)  methylPREDNISolone sodium succinate (SOLU-MEDROL) 125 mg/2 mL injection 125 mg (not administered)  furosemide (LASIX) injection 80 mg (not administered)     Initial Impression / Assessment and Plan / ED Course  I have reviewed the triage vital signs and the nursing notes.  Pertinent labs & imaging results that were available during my care of the patient were reviewed by me and considered in my medical decision making (see  chart for details).     Patient reports 1 month of difficulty breathing with cough and swelling in her lower extremities. Recently switched from torsemide to furosemide without success. States she's gained 16 pounds in the past one week.  Patient has frequent dry cough, she has wheezing and crackles on exam. She'll be given nebulizers, steroids and IV Lasix. Suspect combination of diastolic CHF exacerbation and COPD exacerbation.  3:22 AM: On re-evaluation, patient states her breathing is better after nebulizer treatment and steroids. Her wheezing has improved. Discussed admission with patient.  She has increased WOB with ambulation and sPO2 drops to 90%.  Admission for further diuresis d/w Dr. Alcario Drought.  Final Clinical Impressions(s) / ED Diagnoses   Final diagnoses:  Acute on chronic diastolic congestive heart failure (HCC)    New Prescriptions New Prescriptions   No medications on file  I personally performed the services described in this documentation, which was scribed in my presence. The recorded information has been reviewed and is accurate.    Ezequiel Essex, MD 03/18/17 [phone]

## 2017-03-18 NOTE — ED Notes (Signed)
Admitting Provider at bedside. 

## 2017-03-18 NOTE — Care Management Note (Signed)
Case Management Note  Patient Details  Name: [name] MRN: [id number] Date of Birth: 01-Sep-1951  Subjective/Objective:  Admitted with CHF                 Action/Plan: CM will continue to follow for DCP. Mindi Slicker RN  12/18/2016- Subjective/Objective:   Patient adm from home with acute on chronic CHF. She if from home with husband, ind with ADL's. Has PCP, still drives herself to appointments. Reports no issues with affording medications. She has inhaler and CPAP at home, does not wear CPAP. No oxygen/HH PTA. S Honnicutt RN CM  Expected Discharge Date:    possibly 03/23/2017              Expected Discharge Plan:  Home/Self Care  Discharge planning Services  CM Consult  Status of Service:  In process, will continue to follow  Sherrilyn Rist [phone] 03/18/2017, 10:01 AM

## 2017-03-18 NOTE — Discharge Instructions (Addendum)

## 2017-03-18 NOTE — Care Management Obs Status (Signed)
[facility] NOTIFICATION   Patient Details  Name: [name] MRN: [id number] Date of Birth: 1951-03-09   Medicare Observation Status Notification Given:  Yes    Erenest Rasher, RN 03/18/2017, 1:04 PM

## 2017-03-19 DIAGNOSIS — J441 Chronic obstructive pulmonary disease with (acute) exacerbation: Secondary | ICD-10-CM | POA: Diagnosis not present

## 2017-03-19 DIAGNOSIS — Z952 Presence of prosthetic heart valve: Secondary | ICD-10-CM | POA: Diagnosis not present

## 2017-03-19 DIAGNOSIS — I5033 Acute on chronic diastolic (congestive) heart failure: Secondary | ICD-10-CM | POA: Diagnosis not present

## 2017-03-19 DIAGNOSIS — Z7901 Long term (current) use of anticoagulants: Secondary | ICD-10-CM | POA: Diagnosis not present

## 2017-03-19 LAB — PROTIME-INR
INR: 2.38
PROTHROMBIN TIME: 26.4 s — AB (ref 11.4–15.2)

## 2017-03-19 LAB — BASIC METABOLIC PANEL
Anion gap: 8 (ref 5–15)
BUN: 28 mg/dL — AB (ref 6–20)
CALCIUM: 8.6 mg/dL — AB (ref 8.9–10.3)
CO2: 30 mmol/L (ref 22–32)
CREATININE: 1.38 mg/dL — AB (ref 0.44–1.00)
Chloride: 97 mmol/L — ABNORMAL LOW (ref 101–111)
GFR calc Af Amer: 45 mL/min — ABNORMAL LOW (ref >60)
GFR calc non Af Amer: 39 mL/min — ABNORMAL LOW (ref >60)
GLUCOSE: 97 mg/dL (ref 65–99)
POTASSIUM: 3.8 mmol/L (ref 3.5–5.1)
SODIUM: 135 mmol/L (ref 135–145)

## 2017-03-19 MED ORDER — BENZONATATE 100 MG PO CAPS
200.0000 mg | ORAL_CAPSULE | Freq: Two times a day (BID) | ORAL | Status: DC | PRN
  Administered 2017-03-19: 200 mg via ORAL
  Filled 2017-03-19: qty 2

## 2017-03-19 MED ORDER — FUROSEMIDE 80 MG PO TABS
80.0000 mg | ORAL_TABLET | Freq: Two times a day (BID) | ORAL | Status: DC
  Administered 2017-03-19: 80 mg via ORAL
  Filled 2017-03-19: qty 1

## 2017-03-19 MED ORDER — BENZONATATE 200 MG PO CAPS: 200.0000 mg | ORAL_CAPSULE | Freq: Two times a day (BID) | ORAL | 0 refills | Status: DC | PRN

## 2017-03-19 NOTE — Progress Notes (Signed)
SATURATION QUALIFICATIONS: (This note is used to comply with regulatory documentation for home oxygen)  Patient Saturations on Room Air at Rest = 88%  Patient Saturations on Room Air while Ambulating = 85%  Patient Saturations on 2 Liters of oxygen while Ambulating = 92%  Please briefly explain why patient needs home oxygen:pt unable to maintain O2 sats on RA  [name], PTA Pager: [phone]

## 2017-03-19 NOTE — Discharge Summary (Signed)
Physician Discharge Summary  [name] GGE:[id number] DOB: Mar 07, 1951 DOA: 03/18/2017  PCP: Octavio Graves, DO  Admit date: 03/18/2017 Discharge date: 03/23/2017  Admitted From: Home Disposition: Home  Recommendations for Outpatient Follow-up:  1. Follow up with PCP in 1 week 2. Please obtain BMP/CBC in one week  Home Health: None Equipment/Devices: None  Discharge Condition: Stable CODE STATUS: Full code Diet recommendation: Heart healthy   Brief/Interim Summary:  Admission HPI written by Etta Quill, DO   Chief Complaint: Cough, SOB  HPI: [name] is a 66 y.o. female with medical history significant of CHF, A.Fib, possibly COPD, mechanical AVR in 2007, asthma.  Patient presents to the ED with c/o persistent cough for past week.  Some associated dyspnea, wheezing, acute on chronic BLE swelling.  SOB is worse laying flat.  Albuterol inhaler hasnt provided significant relief.  Weight has increased from 200 lbs to 216 lbs over the last week.  Recently switched from [facility] to lasix 80mg  BID after recent hospital stay for CHF (in Feb).  She does not smoke.  Without my asking the patient or even bringing it up, she does suggest that she likely has too much salt in her diet.   ED Course: Given 80 IV lasix with good UOP.  Also given solumedrol and breathing treatments.    Hospital course:  Acute on chronic diastolic CHF IV diuresis with lasix. Patient with good urine output. Weight down 2 lbs with patient asymptomatic. Patient transitioned to home regimen and instructed to follow-up with PCP.  Mechanical valve Continued coumadin  COPD Given steroids in the ED. No wheezing. Discontinued treatment for possible exacerbation.  Essential hypertension Continued home medications.  CKD stage III Stable creatinine.  Discharge Diagnoses:  Principal Problem:   Acute on chronic diastolic CHF (congestive heart failure) (HCC) Active Problems:   Aortic valve  replaced   Long term current use of anticoagulant therapy   COPD exacerbation [facility])    Discharge Instructions  Discharge Instructions    Call MD for:  difficulty breathing, headache or visual disturbances    Complete by:  As directed    Call MD for:  extreme fatigue    Complete by:  As directed    Call MD for:  persistant dizziness or light-headedness    Complete by:  As directed    Call MD for:  temperature >100.4    Complete by:  As directed    Diet - low sodium heart healthy    Complete by:  As directed    Increase activity slowly    Complete by:  As directed      Allergies as of 03/19/2017      Reactions   Penicillins Hives, Itching, Swelling, Rash   Has patient had a PCN reaction causing immediate rash, facial/tongue/throat swelling, SOB or lightheadedness with hypotension: Yes Has patient had a PCN reaction causing severe rash involving mucus membranes or skin necrosis: Yes Has patient had a PCN reaction that required hospitalization: unknown Has patient had a PCN reaction occurring within the last 10 years: No If all of the above answers are "NO", then may proceed with Cephalosporin use. Throat swelling      Medication List    TAKE these medications   acetaminophen 325 MG tablet Commonly known as:  TYLENOL Take 325 mg by mouth every 6 (six) hours as needed for mild pain or moderate pain.   albuterol 108 (90 Base) MCG/ACT inhaler Commonly known as:  PROVENTIL HFA;VENTOLIN HFA Inhale  1-2 puffs into the lungs every 6 (six) hours as needed for wheezing or shortness of breath.   allopurinol 100 MG tablet Commonly known as:  ZYLOPRIM Take 100 mg by mouth daily.   ALPRAZolam 1 MG tablet Commonly known as:  XANAX Take 1 mg by mouth 3 (three) times daily as needed for anxiety.   benzonatate 200 MG capsule Commonly known as:  TESSALON Take 1 capsule (200 mg total) by mouth 2 (two) times daily as needed for cough.   calcium carbonate 600 MG Tabs tablet Commonly  known as:  OS-CAL Take 600 mg by mouth 2 (two) times daily.   cetirizine 10 MG tablet Commonly known as:  ZYRTEC Take 10 mg by mouth daily.   CRESTOR 10 MG tablet Generic drug:  rosuvastatin Take 10 mg by mouth at bedtime.   cyclobenzaprine 10 MG tablet Commonly known as:  FLEXERIL Take 10 mg by mouth at bedtime.   furosemide 40 MG tablet Commonly known as:  LASIX Take 80 mg by mouth 2 (two) times daily.   HYDROcodone-acetaminophen 10-325 MG tablet Commonly known as:  NORCO Take 1 tablet by mouth at bedtime.   levothyroxine 50 MCG tablet Commonly known as:  SYNTHROID, LEVOTHROID Take 50 mcg by mouth every morning.   MAG-DELAY 535 (64 Mg) MG Tbcr Generic drug:  Magnesium Chloride Take 1 tablet by mouth at bedtime.   metoprolol succinate 50 MG 24 hr tablet Commonly known as:  TOPROL-XL Take 1 tablet (50 mg total) by mouth daily.   multivitamin with minerals Tabs tablet Take 1 tablet by mouth daily.   omeprazole 20 MG capsule Commonly known as:  PRILOSEC Take 20 mg by mouth daily.   ramipril 2.5 MG capsule Commonly known as:  ALTACE Take 2.5 mg by mouth daily.   rOPINIRole 3 MG tablet Commonly known as:  REQUIP Take 6 mg by mouth at bedtime.   temazepam 15 MG capsule Commonly known as:  RESTORIL Take 15 mg by mouth at bedtime.   warfarin 2 MG tablet Commonly known as:  COUMADIN Takes one tablet (2mg ) everyday What changed:  how much to take  how to take this  when to take this  additional instructions       Allergies  Allergen Reactions  . Penicillins Hives, Itching, Swelling and Rash    Has patient had a PCN reaction causing immediate rash, facial/tongue/throat swelling, SOB or lightheadedness with hypotension: Yes Has patient had a PCN reaction causing severe rash involving mucus membranes or skin necrosis: Yes Has patient had a PCN reaction that required hospitalization: unknown Has patient had a PCN reaction occurring within the last 10  years: No If all of the above answers are "NO", then may proceed with Cephalosporin use.    Throat swelling    Consultations:  None   Procedures/Studies: Dg Chest 2 View  Result Date: 03/17/2017 CLINICAL DATA:  Chronic cough. Shortness of breath. Initial encounter. EXAM: CHEST  2 VIEW COMPARISON:  Chest radiograph performed 03/05/2017 FINDINGS: The lungs are well-aerated. Vascular congestion is noted. There is no evidence of focal opacification, pleural effusion or pneumothorax. The heart is mildly enlarged. The patient is status post median sternotomy. A valve replacement is noted. No acute osseous abnormalities are seen. IMPRESSION: Vascular congestion and mild cardiomegaly. Lungs remain grossly clear. Electronically Signed   By: Garald Balding M.D.   On: 03/17/2017 21:48   Dg Chest 2 View  Result Date: 03/05/2017 CLINICAL DATA:  Swelling in the legs bilaterally, cough  EXAM: CHEST  2 VIEW COMPARISON:  12/19/2016 FINDINGS: Post sternotomy changes and valve prosthesis. Trace pleural effusions. Cardiomegaly with mild central congestion. Mild diffuse interstitial prominence. No focal consolidation. Aortic atherosclerosis. No pneumothorax. IMPRESSION: 1. Trace bilateral effusions 2. Cardiomegaly with mild diffuse interstitial prominence, which may indicate mild edema. Electronically Signed   By: Donavan Foil M.D.   On: 03/05/2017 13:56      Subjective: Patient reports no dyspnea or chest pain.  Discharge Exam: Vitals:   03/19/17 1045 03/19/17 1204  BP: (!) 107/53 101/68  Pulse: 76 88  Resp:  18  Temp:  97.9 F (36.6 C)   Vitals:   03/19/17 0030 03/19/17 0541 03/19/17 1045 03/19/17 1204  BP: 105/64 105/70 (!) 107/53 101/68  Pulse: 88 71 76 88  Resp: 17 17  18   Temp: 97.9 F (36.6 C) 97.4 F (36.3 C)  97.9 F (36.6 C)  TempSrc: Oral Oral  Oral  SpO2: 95% 93%  97%  Weight:  96.5 kg (212 lb 11.2 oz)    Height:        General: Pt is alert, awake, not in acute  distress Cardiovascular: RRR, S1/S2 +, no rubs, no gallops. Click present Respiratory: CTA bilaterally, no wheezing, no rhonchi Abdominal: Soft, NT, ND, bowel sounds + Extremities: edema, no cyanosis    The results of significant diagnostics from this hospitalization (including imaging, microbiology, ancillary and laboratory) are listed below for reference.      Labs: BNP (last 3 results)  Recent Labs  12/17/16 2007 03/05/17 1217 03/17/17 2058  BNP 354.0* 367.0* 790.3*   Basic Metabolic Panel:  Recent Labs Lab 03/17/17 2058 03/19/17 0324  NA 136 135  K 3.6 3.8  CL 97* 97*  CO2 30 30  GLUCOSE 119* 97  BUN 18 28*  CREATININE 1.34* 1.38*  CALCIUM 8.7* 8.6*   Liver Function Tests:  Recent Labs Lab 03/17/17 2058  AST 43*  ALT 34  ALKPHOS 84  BILITOT 1.3*  PROT 7.0  ALBUMIN 3.7   CBC:  Recent Labs Lab 03/17/17 2058  WBC 9.1  NEUTROABS 6.5  HGB 10.0*  HCT 32.0*  MCV 91.7  PLT 227   Cardiac Enzymes:  Recent Labs Lab 03/18/17 0108  TROPONINI <0.03    SIGNED:   Cordelia Poche, MD [facility] 03/23/2017, 6:14 PM Pager [phone]  If 7PM-7AM, please contact night-coverage [email or web address] Password [email or web address]

## 2017-03-19 NOTE — Progress Notes (Addendum)
Patient is for discharge home today; noted order for home oxygen; awaiting for qualifying oxygen saturations to determine if pt will qualify for home oxygen before it can be ordered; B Pennie Rushing [phone]   1:39 pm - Patient qualifies for home oxygen, [facility] called and they will bring a portable oxygen tank to the room and deliver more oxygen tanks to the home after discharge. Mindi Slicker [facility] [phone]

## 2017-03-19 NOTE — Progress Notes (Signed)
Physical Therapy Treatment Patient Details Name: [name] MRN: [id number] DOB: 1950/10/26 Today's Date: 03/19/2017    History of Present Illness Pt is a 66 y/o female admitted secondary to SOB, cough and CHF exacerbation. PMH including but not limited to a-fib, CAD s/p CABG in 2007, s/p AVR in 2007 and HLD.    PT Comments    Patient able to tolerate gait and stair negotiation with supervision and standing rest due to 2/4 DOE. Pt unable to maintain O2 sat 90 or > on RA. 2L O2 via [place] used throughout session. Current plan remains appropriate.    Follow Up Recommendations  No PT follow up     Equipment Recommendations  None recommended by PT    Recommendations for Other Services       Precautions / Restrictions Precautions Precautions: None Precaution Comments: monitor SPO2 Restrictions Weight Bearing Restrictions: No    Mobility  Bed Mobility               General bed mobility comments: pt ambulating in room upon arrival  Transfers Overall transfer level: Independent                  Ambulation/Gait Ambulation/Gait assistance: Modified independent (Device/Increase time) Ambulation Distance (Feet): 200 Feet Assistive device: None Gait Pattern/deviations: Step-through pattern;Decreased stride length;Wide base of support Gait velocity: decreased   General Gait Details: 2 standing rest breaks; continues to desat on RA when ambulating; 2/4 DOE; cues for pursed lip breathing and self monitoring for need of rest breaks   Stairs Stairs: Yes   Stair Management: One rail Right;Step to pattern;Forwards Number of Stairs: 3 General stair comments: cues for step to pattern  Wheelchair Mobility    Modified Rankin (Stroke Patients Only)       Balance Overall balance assessment: Needs assistance Sitting-balance support: Feet supported Sitting balance-Leahy Scale: Good     Standing balance support: During functional activity;No upper extremity  supported Standing balance-Leahy Scale: Good                              Cognition Arousal/Alertness: Awake/alert Behavior During Therapy: WFL for tasks assessed/performed Overall Cognitive Status: Within Functional Limits for tasks assessed                                        Exercises      General Comments General comments (skin integrity, edema, etc.): pt educated on energy conservation techniques and given handout      Pertinent Vitals/Pain Pain Assessment: No/denies pain    Home Living                      Prior Function            PT Goals (current goals can now be found in the care plan section) Acute Rehab PT Goals PT Goal Formulation: With patient Time For Goal Achievement: 04/01/17 Potential to Achieve Goals: Good Progress towards PT goals: Progressing toward goals    Frequency    Min 3X/week      PT Plan Current plan remains appropriate    Co-evaluation              AM-PAC PT "6 Clicks" Daily Activity  Outcome Measure  Difficulty turning over in bed (including adjusting bedclothes, sheets and blankets)?: None Difficulty moving from  lying on back to sitting on the side of the bed? : None Difficulty sitting down on and standing up from a chair with arms (e.g., wheelchair, bedside commode, etc,.)?: None Help needed moving to and from a bed to chair (including a wheelchair)?: None Help needed walking in hospital room?: None Help needed climbing 3-5 steps with a railing? : None 6 Click Score: 24    End of Session Equipment Utilized During Treatment: Oxygen Activity Tolerance: Patient tolerated treatment well Patient left: with call bell/phone within reach;Other (comment) (in restroom) Nurse Communication: Mobility status PT Visit Diagnosis: Other abnormalities of gait and mobility (R26.89)     Time: 2010-0712 PT Time Calculation (min) (ACUTE ONLY): 30 min  Charges:  $Gait Training: 8-22  mins $Therapeutic Activity: 8-22 mins                    G Codes:       Earney Navy, PTA Pager: [phone]     Darliss Cheney 03/19/2017, 10:41 AM

## 2017-03-25 ENCOUNTER — Ambulatory Visit (INDEPENDENT_AMBULATORY_CARE_PROVIDER_SITE_OTHER): Payer: Medicare HMO | Admitting: Adult Health

## 2017-03-25 ENCOUNTER — Encounter: Payer: Self-pay | Admitting: Adult Health

## 2017-03-25 VITALS — BP 108/70 | HR 87 | Ht 68.0 in | Wt 202.0 lb

## 2017-03-25 DIAGNOSIS — I5033 Acute on chronic diastolic (congestive) heart failure: Secondary | ICD-10-CM | POA: Diagnosis not present

## 2017-03-25 DIAGNOSIS — I10 Essential (primary) hypertension: Secondary | ICD-10-CM | POA: Diagnosis not present

## 2017-03-25 DIAGNOSIS — Z952 Presence of prosthetic heart valve: Secondary | ICD-10-CM

## 2017-03-25 NOTE — Patient Instructions (Signed)
Your physician recommends that you schedule a follow-up appointment in: [name] has recommended you make the following change in your medication:   HOLD RAMIPRIL FOR 1 WEEK AND UPDATE [name] ON YOUR COUGH  Your physician recommends that you return for lab work in: 1 week BMP  2 [place] ON HOLDING RAMIPRIL   Thank you for choosing [facility]!!

## 2017-03-25 NOTE — Progress Notes (Signed)
Cardiology Office Note   Date:  03/25/2017   ID:  [name], DOB [birth year], MRN [id number]  PCP:  Octavio Graves, DO  Cardiologist:  No chief complaint on file.     History of Present Illness: [name] is a 66 y.o. female who presents for ongoing assessment and management of atrial fibrillation, mechanical aortic valve in 2007, CHF, with history of asthma. Recent hospitalization on 03/19/2017 in the setting of chronic diastolic CHF with IV diuresis. She remained on Coumadin therapy. The patient was given steroids in the emergency room in the setting of COPD although she did not have wheezing. Blood pressure was well-controlled. She is here for post hospitalization follow-up.Discharge weight 212 lbs.   Sensory turning harm the hospital, she has cut back significantly on fluid ingestion, drinking 1 L a day only. She is also additional 10 pounds and is now down to 202 pounds. She is medically compliant. She complains today of a tickle cough that has been bothering her for several months now. He has not improved with diuresis. The patient otherwise is medically compliant and is avoiding salt, and weighing daily.  Past Medical History:  Diagnosis Date  . Acute on chronic diastolic (congestive) heart failure ([place])   . Anxiety disorder   . Aortic stenosis    Status post St. Jude mechanical AVR 2007  . Asthma   . Atrial fibrillation ([place])   . Carcinoid tumor of colon 2007  . Coronary atherosclerosis of native coronary artery    Status post CABG 2007  . GERD (gastroesophageal reflux disease)   . History of colonoscopy 2003   Dr. Gala Romney - normal  . Hyperlipidemia   . Macromastia   . Pedal edema    Bilateral, chronic  . Peptic stricture of esophagus 10/04/2010   GE junction on last EGD by Dr. Gala Romney, benign biopsies  . RLS (restless legs syndrome)   . Schatzki's ring     Past Surgical History:  Procedure Laterality Date  . ABDOMINAL HYSTERECTOMY    . AORTIC VALVE  REPLACEMENT  2007   #25 mm St. 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 APPENDECTOMY  2007  . BACK SURGERY     lumbar 4 and 5   . BIOPSY  02/05/2012   RMR:Two tongues of salmon-colored epithelium distal esophagus, very short-segment Barrett's s/p bx/Small hiatal hernia, otherwise normal stomach, D1, D2. Status post esophageal dilation. Biopsy showed GERD.  . Breast cyst removed     bilateral  . BREAST REDUCTION SURGERY    . CESAREAN SECTION    . 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 COLONOSCOPY  02/05/2012   [facility]:UUVOZD rectum, sigmoid diverticulosis,descending colon polyp , tubular adenoma  . CORONARY ARTERY BYPASS GRAFT     06/2006 - RIMA to RCA, SVG to RCA  . ESOPHAGOGASTRODUODENOSCOPY  10/03/2010   Dr. Gala Romney- schatzki's ring, shoft peptic stricture at GE junction.  Marland Kitchen FOOT SURGERY     bilateral bunionectomy  . HERNIA REPAIR     with mesh  . LAPAROTOMY  2007   small bowel resection secondary to small bowel obstruction  . MALONEY DILATION  02/05/2012   Procedure: Venia Minks DILATION;  Surgeon: Daneil Dolin, MD;  Location: [facility];  Service: Endoscopy;  Laterality: N/A;  38mm   . Teeth removal       Current Outpatient Prescriptions  Medication Sig Dispense Refill  . acetaminophen (TYLENOL) 325 MG tablet Take 325  mg by mouth every 6 (six) hours as needed for mild pain or moderate pain.    Marland Kitchen albuterol (PROVENTIL HFA;VENTOLIN HFA) 108 (90 BASE) MCG/ACT inhaler Inhale 1-2 puffs into the lungs every 6 (six) hours as needed for wheezing or shortness of breath.     . allopurinol (ZYLOPRIM) 100 MG tablet Take 100 mg by mouth daily.    Marland Kitchen ALPRAZolam (XANAX) 1 MG tablet Take 1 mg by mouth 3 (three) times daily as needed for anxiety.     . benzonatate (TESSALON) 200 MG capsule Take 1 capsule (200 mg total) by mouth 2 (two) times daily as needed for cough. 20 capsule 0  . calcium carbonate (OS-CAL) 600 MG TABS tablet Take 600 mg by  mouth 2 (two) times daily.    . cetirizine (ZYRTEC) 10 MG tablet Take 10 mg by mouth daily.      . clobetasol cream (TEMOVATE) 2.95 % Apply 1 application topically 2 (two) times daily.    . CRESTOR 10 MG tablet Take 10 mg by mouth at bedtime.     . cyclobenzaprine (FLEXERIL) 10 MG tablet Take 10 mg by mouth at bedtime.     . furosemide (LASIX) 40 MG tablet Take 80 mg by mouth 2 (two) times daily.    Marland Kitchen HYDROcodone-acetaminophen (NORCO) 10-325 MG per tablet Take 1 tablet by mouth at bedtime.     Marland Kitchen levothyroxine (SYNTHROID, LEVOTHROID) 50 MCG tablet Take 50 mcg by mouth every morning.     . Magnesium Chloride (MAG-DELAY) 535 (64 MG) MG TBCR Take 1 tablet by mouth at bedtime.     . metoprolol succinate (TOPROL-XL) 50 MG 24 hr tablet Take 1 tablet (50 mg total) by mouth daily. 30 tablet 0  . Multiple Vitamin (MULTIVITAMIN WITH MINERALS) TABS tablet Take 1 tablet by mouth daily.    Marland Kitchen omeprazole (PRILOSEC) 20 MG capsule Take 20 mg by mouth daily.     . OXYGEN Inhale into the lungs as needed.    . ramipril (ALTACE) 2.5 MG capsule Take 2.5 mg by mouth daily.     Marland Kitchen rOPINIRole (REQUIP) 3 MG tablet Take 6 mg by mouth at bedtime.     . temazepam (RESTORIL) 15 MG capsule Take 15 mg by mouth at bedtime.     Marland Kitchen warfarin (COUMADIN) 2 MG tablet Takes one tablet (2mg ) everyday (Patient taking differently: Take 2 mg by mouth daily. Takes one tablet (2mg ) everyday) 45 tablet 3   No current facility-administered medications for this visit.     Allergies:   Penicillins    Social History:  The patient  reports that she quit smoking about 28 years ago. Her smoking use included Cigarettes. She has a 20.00 pack-year smoking history. She has never used smokeless tobacco. She reports that she does not drink alcohol or use drugs.   Family History:  The patient's family history includes Cancer in her father; Stroke in her mother.    ROS: All other systems are reviewed and negative. Unless otherwise mentioned in H&P     PHYSICAL EXAM: VS:  BP 108/70   Pulse 87   Ht 5\' 8"  (1.727 m)   Wt 202 lb (91.6 kg)   SpO2 96% Comment: on room air  BMI 30.71 kg/m  , BMI Body mass index is 30.71 kg/m. GEN: Well nourished, well developed, in no acute distress  HEENT: normal  Neck: no JVD, carotid bruits, or masses Cardiac: RRR; Crisp click noted at the left sternal border, no murmurs, rubs,  or gallops,no edema  Respiratory:  Scant crackles in the bases bilaterally GI: soft, nontender, nondistended, + BS MS: no deformity or atrophy  Skin: warm and dry, no rash Neuro:  Strength and sensation are intact Psych: euthymic mood, full affect   Recent Labs: 03/05/2017: TSH 2.449 03/17/2017: ALT 34; B Natriuretic Peptide 435.7; Hemoglobin 10.0; Platelets 227 03/19/2017: BUN 28; Creatinine, Ser 1.38; Potassium 3.8; Sodium 135    Lipid Panel No results found for: CHOL, TRIG, HDL, CHOLHDL, VLDL, LDLCALC, LDLDIRECT    Wt Readings from Last 3 Encounters:  03/25/17 202 lb (91.6 kg)  03/19/17 212 lb 11.2 oz (96.5 kg)  03/05/17 206 lb (93.4 kg)      Other studies Reviewed: Echocardiogram 2016/12/27 Left ventricle: The cavity size was normal. Wall thickness was   increased in a pattern of mild LVH. Systolic function was   vigorous. The estimated ejection fraction was in the range of 65%   to 70%. Wall motion was normal; there were no regional wall   motion abnormalities. The study is not technically sufficient to   allow evaluation of LV diastolic function. - Aortic valve: A St. Jude Medical mechanical prosthesis was   present. Mean gradient (S): 9 mm Hg. Peak gradient (S): 18 mm Hg.   VTI ratio of LVOT to aortic valve: 0.43. - Mitral valve: Moderately to severely calcified annulus. Mildly   thickened leaflets . There was mild regurgitation. - Left atrium: The atrium was moderately dilated. - Right atrium: Central venous pressure (est): 8 mm Hg. - Atrial septum: No defect or patent foramen ovale was  identified. - Tricuspid valve: There was mild regurgitation. - Pulmonary arteries: Systolic pressure could not be accurately   estimated. - Pericardium, extracardiac: There was no pericardial effusion.  ASSESSMENT AND PLAN:  1. Chronic diastolic heart failure: The patient is also additional 10 pounds since discharge from the hospital. She is significantly cut back on fluid intake and she was drinking several liters a day. She is also avoiding salt  weighing daily. She has been complaining of an ongoing tickle cough "like a feather in the back of my throat". Blood pressure soft and therefore I will discontinue Remeron for L4 one week to see if this improves her cough. She will come back in 2 weeks for a blood pressure check. She will notify us if her cough is any better. If it is no better she will go back on the lisinopril. May need to decrease Lasix as she is on 80 mg twice a day with soft blood pressure so. We'll check a BMET for kidney function.  2. St. Jude's mechanical aortic valve: Echocardiogram was completed March 2018, functioning appropriately with low gradients. Continue Coumadin.  3. Hypertension: Blood pressure is soft today will discontinue lisinopril in the setting of tickle cough and reevaluate in 2 weeks. If cough is no better we'll restart lisinopril but may cut back on Lasix. She has been compliant with salt and fluid intake.     Current medicines are reviewed at length with the patient today.    Labs/ tests ordered today include: BMET   Phill Myron. West Pugh, ANP, AACC   03/25/2017 2:23 PM    [facility] [address] [place], [place] [address] Phone: [phone]; Fax: [phone]

## 2017-03-26 ENCOUNTER — Ambulatory Visit (INDEPENDENT_AMBULATORY_CARE_PROVIDER_SITE_OTHER): Payer: Medicare HMO | Admitting: *Deleted

## 2017-03-26 DIAGNOSIS — Z7901 Long term (current) use of anticoagulants: Secondary | ICD-10-CM | POA: Diagnosis not present

## 2017-03-26 DIAGNOSIS — Z952 Presence of prosthetic heart valve: Secondary | ICD-10-CM

## 2017-03-26 DIAGNOSIS — Z5181 Encounter for therapeutic drug level monitoring: Secondary | ICD-10-CM

## 2017-03-26 DIAGNOSIS — I4891 Unspecified atrial fibrillation: Secondary | ICD-10-CM | POA: Diagnosis not present

## 2017-03-26 LAB — POCT INR: INR: 2.3

## 2017-04-01 DIAGNOSIS — I5033 Acute on chronic diastolic (congestive) heart failure: Secondary | ICD-10-CM | POA: Diagnosis not present

## 2017-04-02 ENCOUNTER — Telehealth: Payer: Self-pay

## 2017-04-02 NOTE — Telephone Encounter (Signed)
Since she has improvement in symptoms would stay off of it. Will need to follow up on BP since we stopped it. Have her come by or have BP recording and let us know what it is please.

## 2017-04-02 NOTE — Telephone Encounter (Signed)
Called patient. No answer. Left message to call back.  

## 2017-04-02 NOTE — Telephone Encounter (Signed)
[name] called stating she was told to let Dr. Purcell Nails know how coughing has been since stopping ramipril for 1 week. [name] states she only has about 1 coughing spell a day which is much better than before. [name] wants to know if she should go back on ramipril?

## 2017-04-03 ENCOUNTER — Telehealth: Payer: Self-pay

## 2017-04-03 MED ORDER — POTASSIUM CHLORIDE ER 10 MEQ PO TBCR: 10.0000 meq | EXTENDED_RELEASE_TABLET | Freq: Every day | ORAL | 3 refills | Status: DC

## 2017-04-03 MED ORDER — MAGNESIUM 200 MG PO TABS: 200.0000 mg | ORAL_TABLET | Freq: Every day | ORAL | 3 refills | Status: DC

## 2017-04-03 NOTE — Telephone Encounter (Signed)
BP is good. Unless she is symptomatic, she should stay off of lisinopril. Glad cough is better.

## 2017-04-03 NOTE — Telephone Encounter (Signed)
Patient notified and verbalized understanding. 

## 2017-04-03 NOTE — Telephone Encounter (Signed)
Patient called back stating she does have a way to check blood pressure, she would just have to find batteries for it. Patient states she will call back with bp readings.

## 2017-04-03 NOTE — Telephone Encounter (Signed)
LMTCB-cc, escribed potassium and magnesium to pharmacy

## 2017-04-03 NOTE — Telephone Encounter (Signed)
BP is 107/61 and HR 85 this morning

## 2017-04-03 NOTE — Telephone Encounter (Signed)
-----   Message from Lendon Colonel, NP sent at 04/03/2017 12:40 PM EDT ----- Please start her on potassium 10 mEq daily as her potassium is 3.6, and should be 4.0 in patients with CAD. She is on PPI. Begin magnesium 200 mg daily, which will help keep potassium and magnesium normalized.

## 2017-04-06 NOTE — Telephone Encounter (Signed)
Pt notified , will pick up meds, copied pcp

## 2017-04-06 NOTE — Addendum Note (Signed)
Addended by: Barbarann Ehlers A on: 04/06/2017 01:47 PM   Modules accepted: Orders

## 2017-04-07 DIAGNOSIS — E039 Hypothyroidism, unspecified: Secondary | ICD-10-CM | POA: Diagnosis not present

## 2017-04-07 DIAGNOSIS — Z6830 Body mass index (BMI) 30.0-30.9, adult: Secondary | ICD-10-CM | POA: Diagnosis not present

## 2017-04-07 DIAGNOSIS — M9903 Segmental and somatic dysfunction of lumbar region: Secondary | ICD-10-CM | POA: Diagnosis not present

## 2017-04-07 DIAGNOSIS — I38 Endocarditis, valve unspecified: Secondary | ICD-10-CM | POA: Diagnosis not present

## 2017-04-07 DIAGNOSIS — M5136 Other intervertebral disc degeneration, lumbar region: Secondary | ICD-10-CM | POA: Diagnosis not present

## 2017-04-07 DIAGNOSIS — M9909 Segmental and somatic dysfunction of abdomen and other regions: Secondary | ICD-10-CM | POA: Diagnosis not present

## 2017-04-07 DIAGNOSIS — R05 Cough: Secondary | ICD-10-CM | POA: Diagnosis not present

## 2017-04-07 DIAGNOSIS — I5042 Chronic combined systolic (congestive) and diastolic (congestive) heart failure: Secondary | ICD-10-CM | POA: Diagnosis not present

## 2017-04-07 DIAGNOSIS — I10 Essential (primary) hypertension: Secondary | ICD-10-CM | POA: Diagnosis not present

## 2017-04-08 ENCOUNTER — Ambulatory Visit: Payer: Medicare HMO

## 2017-04-18 DIAGNOSIS — Z7901 Long term (current) use of anticoagulants: Secondary | ICD-10-CM | POA: Diagnosis not present

## 2017-04-18 DIAGNOSIS — J441 Chronic obstructive pulmonary disease with (acute) exacerbation: Secondary | ICD-10-CM | POA: Diagnosis not present

## 2017-04-18 DIAGNOSIS — I5033 Acute on chronic diastolic (congestive) heart failure: Secondary | ICD-10-CM | POA: Diagnosis not present

## 2017-04-21 ENCOUNTER — Ambulatory Visit (INDEPENDENT_AMBULATORY_CARE_PROVIDER_SITE_OTHER): Payer: Medicare HMO | Admitting: *Deleted

## 2017-04-21 DIAGNOSIS — Z952 Presence of prosthetic heart valve: Secondary | ICD-10-CM

## 2017-04-21 DIAGNOSIS — Z5181 Encounter for therapeutic drug level monitoring: Secondary | ICD-10-CM

## 2017-04-21 LAB — POCT INR: INR: 1.8

## 2017-05-19 DIAGNOSIS — Z7901 Long term (current) use of anticoagulants: Secondary | ICD-10-CM | POA: Diagnosis not present

## 2017-05-19 DIAGNOSIS — J441 Chronic obstructive pulmonary disease with (acute) exacerbation: Secondary | ICD-10-CM | POA: Diagnosis not present

## 2017-05-19 DIAGNOSIS — I5033 Acute on chronic diastolic (congestive) heart failure: Secondary | ICD-10-CM | POA: Diagnosis not present

## 2017-05-26 ENCOUNTER — Ambulatory Visit (INDEPENDENT_AMBULATORY_CARE_PROVIDER_SITE_OTHER): Payer: Medicare HMO | Admitting: *Deleted

## 2017-05-26 DIAGNOSIS — Z952 Presence of prosthetic heart valve: Secondary | ICD-10-CM

## 2017-05-26 DIAGNOSIS — Z7901 Long term (current) use of anticoagulants: Secondary | ICD-10-CM | POA: Diagnosis not present

## 2017-05-26 DIAGNOSIS — I4891 Unspecified atrial fibrillation: Secondary | ICD-10-CM

## 2017-05-26 DIAGNOSIS — Z5181 Encounter for therapeutic drug level monitoring: Secondary | ICD-10-CM | POA: Diagnosis not present

## 2017-05-26 LAB — POCT INR: INR: 2.7

## 2017-06-01 DIAGNOSIS — M109 Gout, unspecified: Secondary | ICD-10-CM | POA: Diagnosis not present

## 2017-06-01 DIAGNOSIS — I38 Endocarditis, valve unspecified: Secondary | ICD-10-CM | POA: Diagnosis not present

## 2017-06-01 DIAGNOSIS — M9909 Segmental and somatic dysfunction of abdomen and other regions: Secondary | ICD-10-CM | POA: Diagnosis not present

## 2017-06-01 DIAGNOSIS — E782 Mixed hyperlipidemia: Secondary | ICD-10-CM | POA: Diagnosis not present

## 2017-06-01 DIAGNOSIS — E039 Hypothyroidism, unspecified: Secondary | ICD-10-CM | POA: Diagnosis not present

## 2017-06-01 DIAGNOSIS — M9903 Segmental and somatic dysfunction of lumbar region: Secondary | ICD-10-CM | POA: Diagnosis not present

## 2017-06-01 DIAGNOSIS — I5042 Chronic combined systolic (congestive) and diastolic (congestive) heart failure: Secondary | ICD-10-CM | POA: Diagnosis not present

## 2017-06-01 DIAGNOSIS — R69 Illness, unspecified: Secondary | ICD-10-CM | POA: Diagnosis not present

## 2017-06-01 DIAGNOSIS — I10 Essential (primary) hypertension: Secondary | ICD-10-CM | POA: Diagnosis not present

## 2017-06-01 DIAGNOSIS — M5136 Other intervertebral disc degeneration, lumbar region: Secondary | ICD-10-CM | POA: Diagnosis not present

## 2017-06-15 DIAGNOSIS — R05 Cough: Secondary | ICD-10-CM | POA: Diagnosis not present

## 2017-06-15 DIAGNOSIS — J209 Acute bronchitis, unspecified: Secondary | ICD-10-CM | POA: Diagnosis not present

## 2017-06-19 DIAGNOSIS — I5033 Acute on chronic diastolic (congestive) heart failure: Secondary | ICD-10-CM | POA: Diagnosis not present

## 2017-06-19 DIAGNOSIS — J441 Chronic obstructive pulmonary disease with (acute) exacerbation: Secondary | ICD-10-CM | POA: Diagnosis not present

## 2017-06-19 DIAGNOSIS — Z7901 Long term (current) use of anticoagulants: Secondary | ICD-10-CM | POA: Diagnosis not present

## 2017-06-23 ENCOUNTER — Ambulatory Visit (INDEPENDENT_AMBULATORY_CARE_PROVIDER_SITE_OTHER): Payer: Medicare HMO | Admitting: *Deleted

## 2017-06-23 DIAGNOSIS — Z5181 Encounter for therapeutic drug level monitoring: Secondary | ICD-10-CM | POA: Diagnosis not present

## 2017-06-23 DIAGNOSIS — Z7901 Long term (current) use of anticoagulants: Secondary | ICD-10-CM | POA: Diagnosis not present

## 2017-06-23 DIAGNOSIS — I4891 Unspecified atrial fibrillation: Secondary | ICD-10-CM | POA: Diagnosis not present

## 2017-06-23 DIAGNOSIS — Z952 Presence of prosthetic heart valve: Secondary | ICD-10-CM | POA: Diagnosis not present

## 2017-06-23 LAB — POCT INR: INR: 2.4

## 2017-06-28 NOTE — Progress Notes (Signed)
Cardiology Office Note  Date: 06/29/2017   ID: [name], DOB Feb 25, 1951, MRN [id number]  PCP: Amy Graves, DO  Primary Cardiologist: Amy Lesches, Mendez   Chief Complaint  Patient presents with  . Diastolic heart failure    History of Present Illness: [name] is a 66 y.o. female last seen in June by Ms. West Pugh. She presents for a routine follow-up visit. Weight is down in the upper 180s. She states that she feels well in terms of shortness of breath and leg swelling. Lasix dose is currently at 40 mg twice daily. She has not been weighing herself regularly. I recommended that she weigh daily so that she can better anticipate need to temporarily increase Lasix dose.   She continues to follow in the anticoagulation clinic on Coumadin. She does not report any bleeding episodes. Recent INR was 2.4.  Echocardiogram from March is outlined below.  Past Medical History:  Diagnosis Date  . Acute on chronic diastolic (congestive) heart failure ([place])   . Anxiety disorder   . Aortic stenosis    Status post St. Jude mechanical AVR 2007  . Asthma   . Atrial fibrillation ([place])   . Carcinoid tumor of colon 2007  . Coronary atherosclerosis of native coronary artery    Status post CABG 2007  . GERD (gastroesophageal reflux disease)   . History of colonoscopy 2003   [name] - normal  . Hyperlipidemia   . Macromastia   . Pedal edema    Bilateral, chronic  . Peptic stricture of esophagus 10/04/2010   GE junction on last EGD by [name], benign biopsies  . RLS (restless legs syndrome)   . Schatzki's ring     Past Surgical History:  Procedure Laterality Date  . ABDOMINAL HYSTERECTOMY    . AORTIC VALVE REPLACEMENT  2007   #25 mm St. 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 APPENDECTOMY  2007  . BACK SURGERY     lumbar 4 and 5   . BIOPSY  02/05/2012   RMR:Two tongues of salmon-colored epithelium distal esophagus,  very short-segment Barrett's s/p bx/Small hiatal hernia, otherwise normal stomach, D1, D2. Status post esophageal dilation. Biopsy showed GERD.  . Breast cyst removed     bilateral  . BREAST REDUCTION SURGERY    . CESAREAN SECTION    . 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 COLONOSCOPY  02/05/2012   JTT:SVXBLT rectum, sigmoid diverticulosis,descending colon polyp , tubular adenoma  . CORONARY ARTERY BYPASS GRAFT     06/2006 - RIMA to RCA, SVG to RCA  . ESOPHAGOGASTRODUODENOSCOPY  10/03/2010   [name]- schatzki's ring, shoft peptic stricture at GE junction.  Marland Kitchen FOOT SURGERY     bilateral bunionectomy  . HERNIA REPAIR     with mesh  . LAPAROTOMY  2007   small bowel resection secondary to small bowel obstruction  . MALONEY DILATION  02/05/2012   Procedure: [name] DILATION;  Surgeon: [name];  Location: [facility];  Service: Endoscopy;  Laterality: N/A;  73mm   . Teeth removal      Current Outpatient Prescriptions  Medication Sig Dispense Refill  . acetaminophen (TYLENOL) 325 MG tablet Take 325 mg by mouth every 6 (six) hours as needed for mild pain or moderate pain.    Marland Kitchen albuterol (PROVENTIL HFA;VENTOLIN HFA) 108 (90 BASE) MCG/ACT inhaler Inhale 1-2 puffs into the lungs every 6 (six) hours  as needed for wheezing or shortness of breath.     . allopurinol (ZYLOPRIM) 100 MG tablet Take 100 mg by mouth daily.    Marland Kitchen ALPRAZolam (XANAX) 1 MG tablet Take 1 mg by mouth 3 (three) times daily as needed for anxiety.     . benzonatate (TESSALON) 200 MG capsule Take 1 capsule (200 mg total) by mouth 2 (two) times daily as needed for cough. 20 capsule 0  . calcium carbonate (OS-CAL) 600 MG TABS tablet Take 600 mg by mouth 2 (two) times daily.    . cetirizine (ZYRTEC) 10 MG tablet Take 10 mg by mouth daily.      . clobetasol cream (TEMOVATE) 2.13 % Apply 1 application topically 2 (two) times daily.    . CRESTOR 10 MG tablet Take 10 mg by mouth at bedtime.     .  cyclobenzaprine (FLEXERIL) 10 MG tablet Take 10 mg by mouth at bedtime.     . furosemide (LASIX) 40 MG tablet Take 40 mg by mouth 2 (two) times daily.    Marland Kitchen HYDROcodone-acetaminophen (NORCO) 10-325 MG per tablet Take 1 tablet by mouth at bedtime.     Marland Kitchen levothyroxine (SYNTHROID, LEVOTHROID) 50 MCG tablet Take 50 mcg by mouth every morning.     . Magnesium 200 MG TABS Take 1 tablet (200 mg total) by mouth daily. 90 each 3  . metoprolol succinate (TOPROL-XL) 50 MG 24 hr tablet Take 1 tablet (50 mg total) by mouth daily. 30 tablet 0  . Multiple Vitamin (MULTIVITAMIN WITH MINERALS) TABS tablet Take 1 tablet by mouth daily.    Marland Kitchen omeprazole (PRILOSEC) 20 MG capsule Take 20 mg by mouth daily.     . OXYGEN Inhale into the lungs as needed.    . potassium chloride (K-DUR) 10 MEQ tablet Take 1 tablet (10 mEq total) by mouth daily. 90 tablet 3  . rOPINIRole (REQUIP) 3 MG tablet Take 6 mg by mouth at bedtime.     . temazepam (RESTORIL) 15 MG capsule Take 15 mg by mouth at bedtime.     Marland Kitchen warfarin (COUMADIN) 2 MG tablet Takes one tablet (2mg ) everyday (Patient taking differently: Take 2 mg by mouth daily. Takes one tablet (2mg ) everyday) 45 tablet 3   No current facility-administered medications for this visit.    Allergies:  Penicillins   Social History: The patient  reports that she quit smoking about 28 years ago. Her smoking use included Cigarettes. She has a 20.00 pack-year smoking history. She has never used smokeless tobacco. She reports that she does not drink alcohol or use drugs.   ROS:  Please see the history of present illness. Otherwise, complete review of systems is positive for intermittent cough and allergies.  All other systems are reviewed and negative.   Physical Exam: VS:  BP 100/68   Pulse 68   Ht 5\' 8"  (1.727 m)   Wt 187 lb (84.8 kg)   SpO2 92%   BMI 28.43 kg/m , BMI Body mass index is 28.43 kg/m.  Wt Readings from Last 3 Encounters:  06/29/17 187 lb (84.8 kg)  03/25/17 202 lb  (91.6 kg)  03/19/17 212 lb 11.2 oz (96.5 kg)    Obese woman, appears comfortable at rest.  HEENT: Conjunctiva and lids normal, oropharynx clear.  Neck: Supple, no elevated JVP or carotid bruits, no thyromegaly.  Lungs: Clear to auscultation, nonlabored breathing at rest.  Cardiac: Irregularly irregular, no S3 with crisp mechanical prosthetic click in S2., no pericardial rub.  Abdomen: Soft, nontender, protuberant, bowel sounds present.  Extremities: Mild chronic appearing lower leg edema and venous stasis, distal pulses 1+. Skin: Warm and dry. Musculoskeletal: No kyphosis. Neuropsychiatric: Alert and oriented 3, affect appropriate.  ECG: I personally reviewed the tracing from 03/18/2017 which showed atrial fibrillation with diffuse nonspecific ST/T changes.  Recent Labwork: 03/05/2017: TSH 2.449 03/17/2017: ALT 34; AST 43; B Natriuretic Peptide 435.7; Hemoglobin 10.0; Platelets 227 03/19/2017: BUN 28; Creatinine, Ser 1.38; Potassium 3.8; Sodium 135   Other Studies Reviewed Today:  Echocardiogram 12/19/2016: Study Conclusions  - Left ventricle: The cavity size was normal. Wall thickness was   increased in a pattern of mild LVH. Systolic function was   vigorous. The estimated ejection fraction was in the range of 65%   to 70%. Wall motion was normal; there were no regional wall   motion abnormalities. The study is not technically sufficient to   allow evaluation of LV diastolic function. - Aortic valve: A St. Jude Medical mechanical prosthesis was   present. Mean gradient (S): 9 mm Hg. Peak gradient (S): 18 mm Hg.   VTI ratio of LVOT to aortic valve: 0.43. - Mitral valve: Moderately to severely calcified annulus. Mildly   thickened leaflets . There was mild regurgitation. - Left atrium: The atrium was moderately dilated. - Right atrium: Central venous pressure (est): 8 mm Hg. - Atrial septum: No defect or patent foramen ovale was identified. - Tricuspid valve: There was mild  regurgitation. - Pulmonary arteries: Systolic pressure could not be accurately   estimated. - Pericardium, extracardiac: There was no pericardial effusion.  Impressions:  - Mild LVH with LVEF 65-70%. Indeterminate diastolic function.   Moderate left atrial enlargement. Moderately to severely   calcified mitral annulus with mild mitral regurgitation. St. Jude   prosthesis in aortic position with grossly normal function, mean   gradient 9 mmHg, no obvious perivalvular leak. Mild tricuspid   regurgitation.  Assessment and Plan:  1. Chronic diastolic heart failure. Weight is down and she states she feels better on current dose of Lasix which we will continue. I recommended that she weigh daily, would double Lasix with potassium supplements if weight goes up 2-3 pounds in 24 hours or 5 pounds in a week.  2. History of St. Jude mechanical AVR for management of aortic stenosis. She continues on Coumadin and followed in the anticoagulation clinic. Valve function was grossly normal by recent echocardiogram with mean gradient of 9 mmHg.  3. Chronic atrial fibrillation, continue strategy of heart rate control and anticoagulation. No change in current dose of Toprol-XL.  4. CAD status post CABG in 2007. She reports no active angina symptoms.  Current medicines were reviewed with the patient today.  Disposition: Follow-up in 4 months.  Signed, Satira Sark, Mendez, [facility] 06/29/2017 11:01 AM    [facility] [place], [place], [place] [address] Phone: [phone]; Fax: [phone]

## 2017-06-29 ENCOUNTER — Encounter: Payer: Self-pay | Admitting: Cardiology

## 2017-06-29 ENCOUNTER — Ambulatory Visit (INDEPENDENT_AMBULATORY_CARE_PROVIDER_SITE_OTHER): Payer: Medicare HMO | Admitting: Cardiology

## 2017-06-29 VITALS — BP 100/68 | HR 68 | Ht 68.0 in | Wt 187.0 lb

## 2017-06-29 DIAGNOSIS — I2510 Atherosclerotic heart disease of native coronary artery without angina pectoris: Secondary | ICD-10-CM | POA: Diagnosis not present

## 2017-06-29 DIAGNOSIS — I482 Chronic atrial fibrillation, unspecified: Secondary | ICD-10-CM

## 2017-06-29 DIAGNOSIS — I5032 Chronic diastolic (congestive) heart failure: Secondary | ICD-10-CM | POA: Diagnosis not present

## 2017-06-29 DIAGNOSIS — Z952 Presence of prosthetic heart valve: Secondary | ICD-10-CM

## 2017-06-29 NOTE — Patient Instructions (Addendum)

## 2017-07-02 DIAGNOSIS — M9909 Segmental and somatic dysfunction of abdomen and other regions: Secondary | ICD-10-CM | POA: Diagnosis not present

## 2017-07-02 DIAGNOSIS — M9903 Segmental and somatic dysfunction of lumbar region: Secondary | ICD-10-CM | POA: Diagnosis not present

## 2017-07-02 DIAGNOSIS — I38 Endocarditis, valve unspecified: Secondary | ICD-10-CM | POA: Diagnosis not present

## 2017-07-02 DIAGNOSIS — I10 Essential (primary) hypertension: Secondary | ICD-10-CM | POA: Diagnosis not present

## 2017-07-02 DIAGNOSIS — R69 Illness, unspecified: Secondary | ICD-10-CM | POA: Diagnosis not present

## 2017-07-16 DIAGNOSIS — I83893 Varicose veins of bilateral lower extremities with other complications: Secondary | ICD-10-CM | POA: Diagnosis not present

## 2017-07-16 DIAGNOSIS — I8311 Varicose veins of right lower extremity with inflammation: Secondary | ICD-10-CM | POA: Diagnosis not present

## 2017-07-16 DIAGNOSIS — R600 Localized edema: Secondary | ICD-10-CM | POA: Diagnosis not present

## 2017-07-16 DIAGNOSIS — I8312 Varicose veins of left lower extremity with inflammation: Secondary | ICD-10-CM | POA: Diagnosis not present

## 2017-07-17 DIAGNOSIS — Z23 Encounter for immunization: Secondary | ICD-10-CM | POA: Diagnosis not present

## 2017-07-19 DIAGNOSIS — J441 Chronic obstructive pulmonary disease with (acute) exacerbation: Secondary | ICD-10-CM | POA: Diagnosis not present

## 2017-07-19 DIAGNOSIS — I5033 Acute on chronic diastolic (congestive) heart failure: Secondary | ICD-10-CM | POA: Diagnosis not present

## 2017-07-19 DIAGNOSIS — Z7901 Long term (current) use of anticoagulants: Secondary | ICD-10-CM | POA: Diagnosis not present

## 2017-07-21 ENCOUNTER — Ambulatory Visit (INDEPENDENT_AMBULATORY_CARE_PROVIDER_SITE_OTHER): Payer: Medicare HMO | Admitting: *Deleted

## 2017-07-21 DIAGNOSIS — Z7901 Long term (current) use of anticoagulants: Secondary | ICD-10-CM | POA: Diagnosis not present

## 2017-07-21 DIAGNOSIS — I4891 Unspecified atrial fibrillation: Secondary | ICD-10-CM | POA: Diagnosis not present

## 2017-07-21 DIAGNOSIS — Z952 Presence of prosthetic heart valve: Secondary | ICD-10-CM | POA: Diagnosis not present

## 2017-07-21 DIAGNOSIS — Z5181 Encounter for therapeutic drug level monitoring: Secondary | ICD-10-CM | POA: Diagnosis not present

## 2017-07-21 LAB — POCT INR: INR: 1.6

## 2017-07-30 DIAGNOSIS — I38 Endocarditis, valve unspecified: Secondary | ICD-10-CM | POA: Diagnosis not present

## 2017-07-30 DIAGNOSIS — M109 Gout, unspecified: Secondary | ICD-10-CM | POA: Diagnosis not present

## 2017-07-30 DIAGNOSIS — M9909 Segmental and somatic dysfunction of abdomen and other regions: Secondary | ICD-10-CM | POA: Diagnosis not present

## 2017-07-30 DIAGNOSIS — I5042 Chronic combined systolic (congestive) and diastolic (congestive) heart failure: Secondary | ICD-10-CM | POA: Diagnosis not present

## 2017-07-30 DIAGNOSIS — E782 Mixed hyperlipidemia: Secondary | ICD-10-CM | POA: Diagnosis not present

## 2017-07-30 DIAGNOSIS — M5136 Other intervertebral disc degeneration, lumbar region: Secondary | ICD-10-CM | POA: Diagnosis not present

## 2017-07-30 DIAGNOSIS — M9903 Segmental and somatic dysfunction of lumbar region: Secondary | ICD-10-CM | POA: Diagnosis not present

## 2017-07-30 DIAGNOSIS — I10 Essential (primary) hypertension: Secondary | ICD-10-CM | POA: Diagnosis not present

## 2017-07-30 DIAGNOSIS — E039 Hypothyroidism, unspecified: Secondary | ICD-10-CM | POA: Diagnosis not present

## 2017-07-30 DIAGNOSIS — R69 Illness, unspecified: Secondary | ICD-10-CM | POA: Diagnosis not present

## 2017-07-31 DIAGNOSIS — I8311 Varicose veins of right lower extremity with inflammation: Secondary | ICD-10-CM | POA: Diagnosis not present

## 2017-07-31 DIAGNOSIS — R600 Localized edema: Secondary | ICD-10-CM | POA: Diagnosis not present

## 2017-07-31 DIAGNOSIS — I83893 Varicose veins of bilateral lower extremities with other complications: Secondary | ICD-10-CM | POA: Diagnosis not present

## 2017-07-31 DIAGNOSIS — I8312 Varicose veins of left lower extremity with inflammation: Secondary | ICD-10-CM | POA: Diagnosis not present

## 2017-08-06 ENCOUNTER — Ambulatory Visit (INDEPENDENT_AMBULATORY_CARE_PROVIDER_SITE_OTHER): Payer: Medicare HMO | Admitting: *Deleted

## 2017-08-06 DIAGNOSIS — Z7901 Long term (current) use of anticoagulants: Secondary | ICD-10-CM | POA: Diagnosis not present

## 2017-08-06 DIAGNOSIS — Z5181 Encounter for therapeutic drug level monitoring: Secondary | ICD-10-CM | POA: Diagnosis not present

## 2017-08-06 DIAGNOSIS — I4891 Unspecified atrial fibrillation: Secondary | ICD-10-CM

## 2017-08-06 DIAGNOSIS — Z952 Presence of prosthetic heart valve: Secondary | ICD-10-CM

## 2017-08-06 LAB — POCT INR: INR: 1.6

## 2017-08-11 DIAGNOSIS — R600 Localized edema: Secondary | ICD-10-CM | POA: Diagnosis not present

## 2017-08-11 DIAGNOSIS — I8312 Varicose veins of left lower extremity with inflammation: Secondary | ICD-10-CM | POA: Diagnosis not present

## 2017-08-11 DIAGNOSIS — I8311 Varicose veins of right lower extremity with inflammation: Secondary | ICD-10-CM | POA: Diagnosis not present

## 2017-08-11 DIAGNOSIS — I83893 Varicose veins of bilateral lower extremities with other complications: Secondary | ICD-10-CM | POA: Diagnosis not present

## 2017-08-19 DIAGNOSIS — J441 Chronic obstructive pulmonary disease with (acute) exacerbation: Secondary | ICD-10-CM | POA: Diagnosis not present

## 2017-08-19 DIAGNOSIS — Z7901 Long term (current) use of anticoagulants: Secondary | ICD-10-CM | POA: Diagnosis not present

## 2017-08-19 DIAGNOSIS — I5033 Acute on chronic diastolic (congestive) heart failure: Secondary | ICD-10-CM | POA: Diagnosis not present

## 2017-08-24 DIAGNOSIS — R05 Cough: Secondary | ICD-10-CM | POA: Diagnosis not present

## 2017-08-24 DIAGNOSIS — J45909 Unspecified asthma, uncomplicated: Secondary | ICD-10-CM | POA: Diagnosis not present

## 2017-08-28 ENCOUNTER — Telehealth: Payer: Self-pay | Admitting: Cardiology

## 2017-08-28 NOTE — Telephone Encounter (Signed)
Patient states that Dr. Melina Copa is in [place] due to her father being sick.  Has seen Dr. Deatra Ina that was filling in for her, was prescribed Hydrocodone.  Stated that did not help, seemed like it made her worse.  Stated Dr. Melina Copa has a bunch of different doctors coming in & out over there now.  Has had this cough now for about 2 weeks.  Clear mucous.  No fever.  Informed patient that this type of issue would typically be handled by her pmd, but will fwd to provider for his input.

## 2017-08-28 NOTE — Telephone Encounter (Signed)
Patient called in reference to needing to have a root canal done and other major work. She has contacted her dentist office and advised them to send detail information about what they are needing to do.

## 2017-08-28 NOTE — Telephone Encounter (Signed)
Patient called asking about maybe getting a referral to a lung doctor.   She said that she has a cough that will not go away and nothing seems to help relieve it.

## 2017-08-29 NOTE — Telephone Encounter (Signed)
Agree, should see PCP or urgent care.

## 2017-08-31 DIAGNOSIS — I38 Endocarditis, valve unspecified: Secondary | ICD-10-CM | POA: Diagnosis not present

## 2017-08-31 DIAGNOSIS — J9801 Acute bronchospasm: Secondary | ICD-10-CM | POA: Diagnosis not present

## 2017-08-31 DIAGNOSIS — I5042 Chronic combined systolic (congestive) and diastolic (congestive) heart failure: Secondary | ICD-10-CM | POA: Diagnosis not present

## 2017-08-31 DIAGNOSIS — Z111 Encounter for screening for respiratory tuberculosis: Secondary | ICD-10-CM | POA: Diagnosis not present

## 2017-08-31 DIAGNOSIS — M9903 Segmental and somatic dysfunction of lumbar region: Secondary | ICD-10-CM | POA: Diagnosis not present

## 2017-08-31 DIAGNOSIS — E039 Hypothyroidism, unspecified: Secondary | ICD-10-CM | POA: Diagnosis not present

## 2017-08-31 DIAGNOSIS — R0989 Other specified symptoms and signs involving the circulatory and respiratory systems: Secondary | ICD-10-CM | POA: Diagnosis not present

## 2017-08-31 DIAGNOSIS — M5136 Other intervertebral disc degeneration, lumbar region: Secondary | ICD-10-CM | POA: Diagnosis not present

## 2017-09-01 NOTE — Telephone Encounter (Signed)
Patient notified.  Stated that Dr. Melina Copa came back & she was able to see her.  Did give steroid injection & did chest x-ray & was normal.  Will f/u with her in a month.

## 2017-09-02 NOTE — Telephone Encounter (Signed)
Message sent back to dental office on 11/12 to see if Dentist feels comfortable extracting tooth on coumadin.  If not pt will need to be bridged with Lovenox if she comes off coumadin.  Awaiting response.

## 2017-09-03 ENCOUNTER — Ambulatory Visit (INDEPENDENT_AMBULATORY_CARE_PROVIDER_SITE_OTHER): Payer: Medicare HMO | Admitting: *Deleted

## 2017-09-03 DIAGNOSIS — Z952 Presence of prosthetic heart valve: Secondary | ICD-10-CM

## 2017-09-03 DIAGNOSIS — I4891 Unspecified atrial fibrillation: Secondary | ICD-10-CM

## 2017-09-03 DIAGNOSIS — Z7901 Long term (current) use of anticoagulants: Secondary | ICD-10-CM

## 2017-09-03 DIAGNOSIS — Z5181 Encounter for therapeutic drug level monitoring: Secondary | ICD-10-CM | POA: Diagnosis not present

## 2017-09-03 LAB — POCT INR: INR: 2.1

## 2017-09-18 DIAGNOSIS — Z7901 Long term (current) use of anticoagulants: Secondary | ICD-10-CM | POA: Diagnosis not present

## 2017-09-18 DIAGNOSIS — J441 Chronic obstructive pulmonary disease with (acute) exacerbation: Secondary | ICD-10-CM | POA: Diagnosis not present

## 2017-09-18 DIAGNOSIS — I5033 Acute on chronic diastolic (congestive) heart failure: Secondary | ICD-10-CM | POA: Diagnosis not present

## 2017-09-30 DIAGNOSIS — I5042 Chronic combined systolic (congestive) and diastolic (congestive) heart failure: Secondary | ICD-10-CM | POA: Diagnosis not present

## 2017-09-30 DIAGNOSIS — M109 Gout, unspecified: Secondary | ICD-10-CM | POA: Diagnosis not present

## 2017-09-30 DIAGNOSIS — M5136 Other intervertebral disc degeneration, lumbar region: Secondary | ICD-10-CM | POA: Diagnosis not present

## 2017-09-30 DIAGNOSIS — E039 Hypothyroidism, unspecified: Secondary | ICD-10-CM | POA: Diagnosis not present

## 2017-09-30 DIAGNOSIS — I38 Endocarditis, valve unspecified: Secondary | ICD-10-CM | POA: Diagnosis not present

## 2017-09-30 DIAGNOSIS — E782 Mixed hyperlipidemia: Secondary | ICD-10-CM | POA: Diagnosis not present

## 2017-09-30 DIAGNOSIS — I10 Essential (primary) hypertension: Secondary | ICD-10-CM | POA: Diagnosis not present

## 2017-09-30 DIAGNOSIS — M9909 Segmental and somatic dysfunction of abdomen and other regions: Secondary | ICD-10-CM | POA: Diagnosis not present

## 2017-09-30 DIAGNOSIS — R69 Illness, unspecified: Secondary | ICD-10-CM | POA: Diagnosis not present

## 2017-09-30 DIAGNOSIS — M9903 Segmental and somatic dysfunction of lumbar region: Secondary | ICD-10-CM | POA: Diagnosis not present

## 2017-10-01 ENCOUNTER — Ambulatory Visit (INDEPENDENT_AMBULATORY_CARE_PROVIDER_SITE_OTHER): Payer: Medicare HMO | Admitting: *Deleted

## 2017-10-01 DIAGNOSIS — Z7901 Long term (current) use of anticoagulants: Secondary | ICD-10-CM

## 2017-10-01 DIAGNOSIS — Z952 Presence of prosthetic heart valve: Secondary | ICD-10-CM

## 2017-10-01 DIAGNOSIS — Z5181 Encounter for therapeutic drug level monitoring: Secondary | ICD-10-CM | POA: Diagnosis not present

## 2017-10-01 DIAGNOSIS — I4891 Unspecified atrial fibrillation: Secondary | ICD-10-CM

## 2017-10-01 LAB — POCT INR: INR: 1.9

## 2017-10-12 DIAGNOSIS — R918 Other nonspecific abnormal finding of lung field: Secondary | ICD-10-CM | POA: Diagnosis not present

## 2017-10-12 DIAGNOSIS — N179 Acute kidney failure, unspecified: Secondary | ICD-10-CM | POA: Diagnosis not present

## 2017-10-12 DIAGNOSIS — J440 Chronic obstructive pulmonary disease with acute lower respiratory infection: Secondary | ICD-10-CM | POA: Diagnosis not present

## 2017-10-12 DIAGNOSIS — I5023 Acute on chronic systolic (congestive) heart failure: Secondary | ICD-10-CM | POA: Diagnosis not present

## 2017-10-12 DIAGNOSIS — D649 Anemia, unspecified: Secondary | ICD-10-CM | POA: Diagnosis not present

## 2017-10-12 DIAGNOSIS — R0602 Shortness of breath: Secondary | ICD-10-CM | POA: Diagnosis not present

## 2017-10-12 DIAGNOSIS — J181 Lobar pneumonia, unspecified organism: Secondary | ICD-10-CM | POA: Diagnosis not present

## 2017-10-12 DIAGNOSIS — J9620 Acute and chronic respiratory failure, unspecified whether with hypoxia or hypercapnia: Secondary | ICD-10-CM | POA: Diagnosis not present

## 2017-10-12 DIAGNOSIS — A419 Sepsis, unspecified organism: Secondary | ICD-10-CM | POA: Diagnosis not present

## 2017-10-12 DIAGNOSIS — I4891 Unspecified atrial fibrillation: Secondary | ICD-10-CM | POA: Diagnosis not present

## 2017-10-12 DIAGNOSIS — J441 Chronic obstructive pulmonary disease with (acute) exacerbation: Secondary | ICD-10-CM | POA: Diagnosis not present

## 2017-10-12 DIAGNOSIS — E876 Hypokalemia: Secondary | ICD-10-CM | POA: Diagnosis not present

## 2017-10-13 DIAGNOSIS — J9620 Acute and chronic respiratory failure, unspecified whether with hypoxia or hypercapnia: Secondary | ICD-10-CM | POA: Diagnosis not present

## 2017-10-14 DIAGNOSIS — J441 Chronic obstructive pulmonary disease with (acute) exacerbation: Secondary | ICD-10-CM | POA: Diagnosis not present

## 2017-10-14 DIAGNOSIS — J181 Lobar pneumonia, unspecified organism: Secondary | ICD-10-CM | POA: Diagnosis not present

## 2017-10-14 DIAGNOSIS — A419 Sepsis, unspecified organism: Secondary | ICD-10-CM | POA: Diagnosis not present

## 2017-10-14 DIAGNOSIS — D649 Anemia, unspecified: Secondary | ICD-10-CM | POA: Diagnosis not present

## 2017-10-14 DIAGNOSIS — I4891 Unspecified atrial fibrillation: Secondary | ICD-10-CM | POA: Diagnosis not present

## 2017-10-14 DIAGNOSIS — N179 Acute kidney failure, unspecified: Secondary | ICD-10-CM | POA: Diagnosis not present

## 2017-10-14 DIAGNOSIS — J440 Chronic obstructive pulmonary disease with acute lower respiratory infection: Secondary | ICD-10-CM | POA: Diagnosis not present

## 2017-10-14 DIAGNOSIS — I5023 Acute on chronic systolic (congestive) heart failure: Secondary | ICD-10-CM | POA: Diagnosis not present

## 2017-10-14 DIAGNOSIS — J9620 Acute and chronic respiratory failure, unspecified whether with hypoxia or hypercapnia: Secondary | ICD-10-CM | POA: Diagnosis not present

## 2017-10-14 DIAGNOSIS — E876 Hypokalemia: Secondary | ICD-10-CM | POA: Diagnosis not present

## 2017-10-15 DIAGNOSIS — J9620 Acute and chronic respiratory failure, unspecified whether with hypoxia or hypercapnia: Secondary | ICD-10-CM | POA: Diagnosis not present

## 2017-10-15 DIAGNOSIS — J181 Lobar pneumonia, unspecified organism: Secondary | ICD-10-CM | POA: Diagnosis not present

## 2017-10-15 DIAGNOSIS — A419 Sepsis, unspecified organism: Secondary | ICD-10-CM | POA: Diagnosis not present

## 2017-10-15 DIAGNOSIS — E876 Hypokalemia: Secondary | ICD-10-CM | POA: Diagnosis not present

## 2017-10-15 DIAGNOSIS — I4891 Unspecified atrial fibrillation: Secondary | ICD-10-CM | POA: Diagnosis not present

## 2017-10-15 DIAGNOSIS — J440 Chronic obstructive pulmonary disease with acute lower respiratory infection: Secondary | ICD-10-CM | POA: Diagnosis not present

## 2017-10-15 DIAGNOSIS — N179 Acute kidney failure, unspecified: Secondary | ICD-10-CM | POA: Diagnosis not present

## 2017-10-15 DIAGNOSIS — D649 Anemia, unspecified: Secondary | ICD-10-CM | POA: Diagnosis not present

## 2017-10-15 DIAGNOSIS — I5023 Acute on chronic systolic (congestive) heart failure: Secondary | ICD-10-CM | POA: Diagnosis not present

## 2017-10-15 DIAGNOSIS — J441 Chronic obstructive pulmonary disease with (acute) exacerbation: Secondary | ICD-10-CM | POA: Diagnosis not present

## 2017-10-16 DIAGNOSIS — E876 Hypokalemia: Secondary | ICD-10-CM | POA: Diagnosis not present

## 2017-10-16 DIAGNOSIS — J441 Chronic obstructive pulmonary disease with (acute) exacerbation: Secondary | ICD-10-CM | POA: Diagnosis not present

## 2017-10-16 DIAGNOSIS — J440 Chronic obstructive pulmonary disease with acute lower respiratory infection: Secondary | ICD-10-CM | POA: Diagnosis not present

## 2017-10-16 DIAGNOSIS — D649 Anemia, unspecified: Secondary | ICD-10-CM | POA: Diagnosis not present

## 2017-10-16 DIAGNOSIS — J9620 Acute and chronic respiratory failure, unspecified whether with hypoxia or hypercapnia: Secondary | ICD-10-CM | POA: Diagnosis not present

## 2017-10-16 DIAGNOSIS — I5023 Acute on chronic systolic (congestive) heart failure: Secondary | ICD-10-CM | POA: Diagnosis not present

## 2017-10-16 DIAGNOSIS — J181 Lobar pneumonia, unspecified organism: Secondary | ICD-10-CM | POA: Diagnosis not present

## 2017-10-16 DIAGNOSIS — A419 Sepsis, unspecified organism: Secondary | ICD-10-CM | POA: Diagnosis not present

## 2017-10-16 DIAGNOSIS — I4891 Unspecified atrial fibrillation: Secondary | ICD-10-CM | POA: Diagnosis not present

## 2017-10-16 DIAGNOSIS — N179 Acute kidney failure, unspecified: Secondary | ICD-10-CM | POA: Diagnosis not present

## 2017-10-19 DIAGNOSIS — J441 Chronic obstructive pulmonary disease with (acute) exacerbation: Secondary | ICD-10-CM | POA: Diagnosis not present

## 2017-10-19 DIAGNOSIS — Z7901 Long term (current) use of anticoagulants: Secondary | ICD-10-CM | POA: Diagnosis not present

## 2017-10-19 DIAGNOSIS — I5033 Acute on chronic diastolic (congestive) heart failure: Secondary | ICD-10-CM | POA: Diagnosis not present

## 2017-10-29 ENCOUNTER — Ambulatory Visit (INDEPENDENT_AMBULATORY_CARE_PROVIDER_SITE_OTHER): Payer: Medicare HMO | Admitting: *Deleted

## 2017-10-29 DIAGNOSIS — Z7901 Long term (current) use of anticoagulants: Secondary | ICD-10-CM

## 2017-10-29 DIAGNOSIS — Z952 Presence of prosthetic heart valve: Secondary | ICD-10-CM

## 2017-10-29 DIAGNOSIS — I4891 Unspecified atrial fibrillation: Secondary | ICD-10-CM | POA: Diagnosis not present

## 2017-10-29 DIAGNOSIS — Z5181 Encounter for therapeutic drug level monitoring: Secondary | ICD-10-CM

## 2017-10-29 DIAGNOSIS — I38 Endocarditis, valve unspecified: Secondary | ICD-10-CM | POA: Diagnosis not present

## 2017-10-29 DIAGNOSIS — J0110 Acute frontal sinusitis, unspecified: Secondary | ICD-10-CM | POA: Diagnosis not present

## 2017-10-29 DIAGNOSIS — R05 Cough: Secondary | ICD-10-CM | POA: Diagnosis not present

## 2017-10-29 LAB — POCT INR: INR: 2.7

## 2017-10-29 NOTE — Patient Instructions (Signed)
Continue coumadin 1 tablet daily except 1 1/2 tablets on Tuesdays, Thursdays and Saturdays Recheck in 4 weeks 

## 2017-11-04 DIAGNOSIS — R0602 Shortness of breath: Secondary | ICD-10-CM | POA: Diagnosis not present

## 2017-11-04 DIAGNOSIS — R05 Cough: Secondary | ICD-10-CM | POA: Diagnosis not present

## 2017-11-05 ENCOUNTER — Ambulatory Visit (INDEPENDENT_AMBULATORY_CARE_PROVIDER_SITE_OTHER): Payer: Medicare HMO | Admitting: *Deleted

## 2017-11-05 DIAGNOSIS — Z5181 Encounter for therapeutic drug level monitoring: Secondary | ICD-10-CM

## 2017-11-05 DIAGNOSIS — Z952 Presence of prosthetic heart valve: Secondary | ICD-10-CM | POA: Diagnosis not present

## 2017-11-05 DIAGNOSIS — Z7901 Long term (current) use of anticoagulants: Secondary | ICD-10-CM

## 2017-11-05 DIAGNOSIS — I4891 Unspecified atrial fibrillation: Secondary | ICD-10-CM | POA: Diagnosis not present

## 2017-11-05 LAB — POCT INR: INR: 3.4

## 2017-11-05 NOTE — Patient Instructions (Signed)
Hold coumadin tonight then decrease dose to 1 tablet daily until finished with Abx Recheck in 1 week

## 2017-11-09 DIAGNOSIS — I5042 Chronic combined systolic (congestive) and diastolic (congestive) heart failure: Secondary | ICD-10-CM | POA: Diagnosis not present

## 2017-11-09 DIAGNOSIS — M9903 Segmental and somatic dysfunction of lumbar region: Secondary | ICD-10-CM | POA: Diagnosis not present

## 2017-11-09 DIAGNOSIS — I10 Essential (primary) hypertension: Secondary | ICD-10-CM | POA: Diagnosis not present

## 2017-11-09 DIAGNOSIS — I38 Endocarditis, valve unspecified: Secondary | ICD-10-CM | POA: Diagnosis not present

## 2017-11-09 DIAGNOSIS — M5136 Other intervertebral disc degeneration, lumbar region: Secondary | ICD-10-CM | POA: Diagnosis not present

## 2017-11-09 DIAGNOSIS — E039 Hypothyroidism, unspecified: Secondary | ICD-10-CM | POA: Diagnosis not present

## 2017-11-09 DIAGNOSIS — R69 Illness, unspecified: Secondary | ICD-10-CM | POA: Diagnosis not present

## 2017-11-09 DIAGNOSIS — E782 Mixed hyperlipidemia: Secondary | ICD-10-CM | POA: Diagnosis not present

## 2017-11-09 DIAGNOSIS — M9909 Segmental and somatic dysfunction of abdomen and other regions: Secondary | ICD-10-CM | POA: Diagnosis not present

## 2017-11-09 DIAGNOSIS — M109 Gout, unspecified: Secondary | ICD-10-CM | POA: Diagnosis not present

## 2017-11-12 ENCOUNTER — Ambulatory Visit (INDEPENDENT_AMBULATORY_CARE_PROVIDER_SITE_OTHER): Payer: Medicare HMO | Admitting: *Deleted

## 2017-11-12 DIAGNOSIS — I4891 Unspecified atrial fibrillation: Secondary | ICD-10-CM | POA: Diagnosis not present

## 2017-11-12 DIAGNOSIS — Z5181 Encounter for therapeutic drug level monitoring: Secondary | ICD-10-CM | POA: Diagnosis not present

## 2017-11-12 DIAGNOSIS — Z7901 Long term (current) use of anticoagulants: Secondary | ICD-10-CM

## 2017-11-12 DIAGNOSIS — Z952 Presence of prosthetic heart valve: Secondary | ICD-10-CM

## 2017-11-12 LAB — POCT INR: INR: 3

## 2017-11-12 NOTE — Patient Instructions (Signed)
Finished antibiotic on Tuesday.  Increase coumadin to 1 tablet daily except 1 1/2 tablets on Tuesdays and Saturdays Recheck in 2 weeks

## 2017-11-19 DIAGNOSIS — Z7901 Long term (current) use of anticoagulants: Secondary | ICD-10-CM | POA: Diagnosis not present

## 2017-11-19 DIAGNOSIS — I5033 Acute on chronic diastolic (congestive) heart failure: Secondary | ICD-10-CM | POA: Diagnosis not present

## 2017-11-19 DIAGNOSIS — J441 Chronic obstructive pulmonary disease with (acute) exacerbation: Secondary | ICD-10-CM | POA: Diagnosis not present

## 2017-11-24 DIAGNOSIS — M5136 Other intervertebral disc degeneration, lumbar region: Secondary | ICD-10-CM | POA: Diagnosis not present

## 2017-11-24 DIAGNOSIS — I5042 Chronic combined systolic (congestive) and diastolic (congestive) heart failure: Secondary | ICD-10-CM | POA: Diagnosis not present

## 2017-11-24 DIAGNOSIS — E039 Hypothyroidism, unspecified: Secondary | ICD-10-CM | POA: Diagnosis not present

## 2017-11-24 DIAGNOSIS — R69 Illness, unspecified: Secondary | ICD-10-CM | POA: Diagnosis not present

## 2017-11-24 DIAGNOSIS — M9909 Segmental and somatic dysfunction of abdomen and other regions: Secondary | ICD-10-CM | POA: Diagnosis not present

## 2017-11-24 DIAGNOSIS — I38 Endocarditis, valve unspecified: Secondary | ICD-10-CM | POA: Diagnosis not present

## 2017-11-24 DIAGNOSIS — M9903 Segmental and somatic dysfunction of lumbar region: Secondary | ICD-10-CM | POA: Diagnosis not present

## 2017-11-24 DIAGNOSIS — M109 Gout, unspecified: Secondary | ICD-10-CM | POA: Diagnosis not present

## 2017-11-24 DIAGNOSIS — I10 Essential (primary) hypertension: Secondary | ICD-10-CM | POA: Diagnosis not present

## 2017-11-24 DIAGNOSIS — E782 Mixed hyperlipidemia: Secondary | ICD-10-CM | POA: Diagnosis not present

## 2017-12-02 NOTE — Progress Notes (Signed)
Cardiology Office Note  Date: 12/03/2017   ID: [name], DOB Mar 19, 1951, MRN [id number]  PCP: Octavio Graves, DO  Primary Cardiologist: Rozann Lesches, MD   Chief Complaint  Patient presents with  . Diastolic heart failure    History of Present Illness: [name] is a 67 y.o. female last seen in September 2018.  She presents today for a routine follow-up visit.  She does not report any major change in stamina or increasing breath since last visit.  Her weight is up compared to last visit, but she states that she has been taking her medications regularly including Lasix with potassium supplements.  She continues on Coumadin with follow-up in the anticoagulation clinic.  No reported bleeding episodes.  She was subtherapeutic today with adjustments subsequently made.  Echocardiogram from March of last year revealed LVEF 65-70% with normally functioning mechanical aortic prosthesis.  Past Medical History:  Diagnosis Date  . Acute on chronic diastolic (congestive) heart failure ([place])   . Anxiety disorder   . Aortic stenosis    Status post St. Jude mechanical AVR 2007  . Asthma   . Atrial fibrillation ([place])   . Carcinoid tumor of colon 2007  . Coronary atherosclerosis of native coronary artery    Status post CABG 2007  . GERD (gastroesophageal reflux disease)   . History of colonoscopy 2003   Dr. Gala Romney - normal  . Hyperlipidemia   . Macromastia   . Pedal edema    Bilateral, chronic  . Peptic stricture of esophagus 10/04/2010   GE junction on last EGD by Dr. Gala Romney, benign biopsies  . RLS (restless legs syndrome)   . Schatzki's ring     Past Surgical History:  Procedure Laterality Date  . ABDOMINAL HYSTERECTOMY    . AORTIC VALVE REPLACEMENT  2007   #25 mm St. 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 APPENDECTOMY  2007  . BACK SURGERY     lumbar 4 and 5   . BIOPSY  02/05/2012   RMR:Two tongues of salmon-colored  epithelium distal esophagus, very short-segment Barrett's s/p bx/Small hiatal hernia, otherwise normal stomach, D1, D2. Status post esophageal dilation. Biopsy showed GERD.  . Breast cyst removed     bilateral  . BREAST REDUCTION SURGERY    . CESAREAN SECTION    . 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 COLONOSCOPY  02/05/2012   OBS:[id number] rectum, sigmoid diverticulosis,descending colon polyp , tubular adenoma  . CORONARY ARTERY BYPASS GRAFT     06/2006 - RIMA to RCA, SVG to RCA  . ESOPHAGOGASTRODUODENOSCOPY  10/03/2010   Dr. Gala Romney- schatzki's ring, shoft peptic stricture at GE junction.  Marland Kitchen FOOT SURGERY     bilateral bunionectomy  . HERNIA REPAIR     with mesh  . LAPAROTOMY  2007   small bowel resection secondary to small bowel obstruction  . MALONEY DILATION  02/05/2012   Procedure: Venia Minks DILATION;  Surgeon: Daneil Dolin, MD;  Location: [facility];  Service: Endoscopy;  Laterality: N/A;  31mm   . Teeth removal      Current Outpatient Medications  Medication Sig Dispense Refill  . acetaminophen (TYLENOL) 325 MG tablet Take 325 mg by mouth every 6 (six) hours as needed for mild pain or moderate pain.    Marland Kitchen albuterol (PROVENTIL HFA;VENTOLIN HFA) 108 (90 BASE) MCG/ACT inhaler Inhale 1-2 puffs into the lungs every 6 (six) hours as needed for wheezing or  shortness of breath.     . allopurinol (ZYLOPRIM) 100 MG tablet Take 100 mg by mouth daily.    Marland Kitchen ALPRAZolam (XANAX) 1 MG tablet Take 1 mg by mouth 3 (three) times daily as needed for anxiety.     . benzonatate (TESSALON) 200 MG capsule Take 1 capsule (200 mg total) by mouth 2 (two) times daily as needed for cough. 20 capsule 0  . calcium carbonate (OS-CAL) 600 MG TABS tablet Take 600 mg by mouth 2 (two) times daily.    . cetirizine (ZYRTEC) 10 MG tablet Take 10 mg by mouth daily.      . clobetasol cream (TEMOVATE) 8.34 % Apply 1 application topically 2 (two) times daily.    . CRESTOR 10 MG tablet Take 10 mg by mouth at  bedtime.     . cyclobenzaprine (FLEXERIL) 10 MG tablet Take 10 mg by mouth at bedtime.     . furosemide (LASIX) 40 MG tablet Take 40 mg by mouth 2 (two) times daily.    Marland Kitchen HYDROcodone-acetaminophen (NORCO) 10-325 MG per tablet Take 1 tablet by mouth at bedtime.     Marland Kitchen levothyroxine (SYNTHROID, LEVOTHROID) 50 MCG tablet Take 50 mcg by mouth every morning.     . Magnesium 200 MG TABS Take 1 tablet (200 mg total) by mouth daily. 90 each 3  . metoprolol succinate (TOPROL-XL) 50 MG 24 hr tablet Take 1 tablet (50 mg total) by mouth daily. 30 tablet 0  . Multiple Vitamin (MULTIVITAMIN WITH MINERALS) TABS tablet Take 1 tablet by mouth daily.    Marland Kitchen omeprazole (PRILOSEC) 20 MG capsule Take 20 mg by mouth daily.     . OXYGEN Inhale into the lungs as needed.    Marland Kitchen rOPINIRole (REQUIP) 3 MG tablet Take 6 mg by mouth at bedtime.     . temazepam (RESTORIL) 15 MG capsule Take 15 mg by mouth at bedtime.     Marland Kitchen warfarin (COUMADIN) 2 MG tablet Takes one tablet (2mg ) everyday (Patient taking differently: Take 2 mg by mouth daily. Takes one tablet (2mg ) everyday) 45 tablet 3  . potassium chloride (K-DUR) 10 MEQ tablet Take 1 tablet (10 mEq total) by mouth daily. 90 tablet 3   No current facility-administered medications for this visit.    Allergies:  Penicillins   Social History: The patient  reports that she quit smoking about 29 years ago. Her smoking use included cigarettes. She has a 20.00 pack-year smoking history. she has never used smokeless tobacco. She reports that she does not drink alcohol or use drugs.   ROS:  Please see the history of present illness. Otherwise, complete review of systems is positive for intermittent leg swelling.  All other systems are reviewed and negative.   Physical Exam: VS:  BP 122/74 (BP Location: Left Arm)   Pulse 88   Ht 5\' 5"  (1.651 m)   Wt 200 lb (90.7 kg)   SpO2 93%   BMI 33.28 kg/m , BMI Body mass index is 33.28 kg/m.  Wt Readings from Last 3 Encounters:  12/03/17  200 lb (90.7 kg)  06/29/17 187 lb (84.8 kg)  03/25/17 202 lb (91.6 kg)    General: Overweight woman, appears comfortable at rest. HEENT: Conjunctiva and lids normal, oropharynx clear. Neck: Supple, no elevated JVP or carotid bruits, no thyromegaly. Lungs: Clear to auscultation, nonlabored breathing at rest. Cardiac: Irregularly irregular, no S3, crisp mechanical prosthetic click and S2, no pericardial rub. Abdomen: Soft, nontender, bowel sounds present. Extremities: Chronic lower  leg edema and venous stasis, distal pulses 1+. Skin: Warm and dry. Musculoskeletal: No kyphosis. Neuropsychiatric: Alert and oriented x3, affect grossly appropriate.  ECG: I personally reviewed the tracing from 02/19/2017 which showed rate controlled atrial fibrillation with nonspecific ST-T changes.  Recent Labwork: 03/05/2017: TSH 2.449 03/17/2017: ALT 34; AST 43; B Natriuretic Peptide 435.7; Hemoglobin 10.0; Platelets 227 03/19/2017: BUN 28; Creatinine, Ser 1.38; Potassium 3.8; Sodium 135   Other Studies Reviewed Today:  Echocardiogram 12/19/2016: Study Conclusions  - Left ventricle: The cavity size was normal. Wall thickness was   increased in a pattern of mild LVH. Systolic function was   vigorous. The estimated ejection fraction was in the range of 65%   to 70%. Wall motion was normal; there were no regional wall   motion abnormalities. The study is not technically sufficient to   allow evaluation of LV diastolic function. - Aortic valve: A St. Jude Medical mechanical prosthesis was   present. Mean gradient (S): 9 mm Hg. Peak gradient (S): 18 mm Hg.   VTI ratio of LVOT to aortic valve: 0.43. - Mitral valve: Moderately to severely calcified annulus. Mildly   thickened leaflets . There was mild regurgitation. - Left atrium: The atrium was moderately dilated. - Right atrium: Central venous pressure (est): 8 mm Hg. - Atrial septum: No defect or patent foramen ovale was identified. - Tricuspid valve:  There was mild regurgitation. - Pulmonary arteries: Systolic pressure could not be accurately   estimated. - Pericardium, extracardiac: There was no pericardial effusion.  Impressions:  - Mild LVH with LVEF 65-70%. Indeterminate diastolic function.   Moderate left atrial enlargement. Moderately to severely   calcified mitral annulus with mild mitral regurgitation. St. Jude   prosthesis in aortic position with grossly normal function, mean   gradient 9 mmHg, no obvious perivalvular leak. Mild tricuspid   regurgitation.  Assessment and Plan:  1.  Chronic diastolic heart failure.  Plan to continue Lasix dosing although I did mention to her that her weight had gradually increased and asked her to keep an eye on this at home.  We have already discussed doubling her Lasix dose with potassium supplements if her weight increases 2-3 pounds in 24 hours.  2.  St. Jude mechanical AVR in position, normal function by echocardiogram last year.  She continues on Coumadin with follow-up in the anticoagulation clinic.  3.  Chronic atrial fibrillation, asymptomatic in terms of palpitations.  Heart rate control is adequate on Toprol-XL.  4.  CAD status post CABG in 2007.  She reports no angina symptoms.  Continue statin therapy.  She is not on aspirin due to use of Coumadin.  Current medicines were reviewed with the patient today.  Disposition: Follow-up in 6 months, sooner if needed.  Signed, Satira Sark, MD, [facility] 12/03/2017 11:38 AM    [facility] [place], [place], [place] [address] Phone: [phone]; Fax: [phone]

## 2017-12-03 ENCOUNTER — Ambulatory Visit: Payer: Medicare HMO | Admitting: Cardiology

## 2017-12-03 ENCOUNTER — Ambulatory Visit (INDEPENDENT_AMBULATORY_CARE_PROVIDER_SITE_OTHER): Payer: Medicare HMO | Admitting: *Deleted

## 2017-12-03 ENCOUNTER — Encounter: Payer: Self-pay | Admitting: Cardiology

## 2017-12-03 VITALS — BP 122/74 | HR 88 | Ht 65.0 in | Wt 200.0 lb

## 2017-12-03 DIAGNOSIS — I2510 Atherosclerotic heart disease of native coronary artery without angina pectoris: Secondary | ICD-10-CM | POA: Diagnosis not present

## 2017-12-03 DIAGNOSIS — Z952 Presence of prosthetic heart valve: Secondary | ICD-10-CM | POA: Diagnosis not present

## 2017-12-03 DIAGNOSIS — I4891 Unspecified atrial fibrillation: Secondary | ICD-10-CM

## 2017-12-03 DIAGNOSIS — Z5181 Encounter for therapeutic drug level monitoring: Secondary | ICD-10-CM | POA: Diagnosis not present

## 2017-12-03 DIAGNOSIS — I482 Chronic atrial fibrillation, unspecified: Secondary | ICD-10-CM

## 2017-12-03 DIAGNOSIS — Z7901 Long term (current) use of anticoagulants: Secondary | ICD-10-CM | POA: Diagnosis not present

## 2017-12-03 DIAGNOSIS — I5032 Chronic diastolic (congestive) heart failure: Secondary | ICD-10-CM | POA: Diagnosis not present

## 2017-12-03 LAB — POCT INR: INR: 1.5

## 2017-12-03 NOTE — Patient Instructions (Signed)

## 2017-12-03 NOTE — Patient Instructions (Signed)
Take coumadin 2 tablets tonight and tomorrow night then increase dose to 1 tablet daily except 1 1/2 tablets on Mondays, Wednesdays and Fridays Recheck in 2 weeks

## 2017-12-17 ENCOUNTER — Ambulatory Visit (INDEPENDENT_AMBULATORY_CARE_PROVIDER_SITE_OTHER): Payer: Medicare HMO | Admitting: *Deleted

## 2017-12-17 DIAGNOSIS — I4891 Unspecified atrial fibrillation: Secondary | ICD-10-CM | POA: Diagnosis not present

## 2017-12-17 DIAGNOSIS — Z5181 Encounter for therapeutic drug level monitoring: Secondary | ICD-10-CM

## 2017-12-17 DIAGNOSIS — Z952 Presence of prosthetic heart valve: Secondary | ICD-10-CM | POA: Diagnosis not present

## 2017-12-17 DIAGNOSIS — I5033 Acute on chronic diastolic (congestive) heart failure: Secondary | ICD-10-CM | POA: Diagnosis not present

## 2017-12-17 DIAGNOSIS — Z7901 Long term (current) use of anticoagulants: Secondary | ICD-10-CM

## 2017-12-17 DIAGNOSIS — J441 Chronic obstructive pulmonary disease with (acute) exacerbation: Secondary | ICD-10-CM | POA: Diagnosis not present

## 2017-12-17 LAB — POCT INR: INR: 2.5

## 2017-12-17 NOTE — Patient Instructions (Signed)
Continue coumadin 1 tablet daily except 1 1/2 tablets on Mondays, Wednesdays and Fridays Recheck in 4 weeks  

## 2017-12-22 DIAGNOSIS — M9903 Segmental and somatic dysfunction of lumbar region: Secondary | ICD-10-CM | POA: Diagnosis not present

## 2017-12-22 DIAGNOSIS — E782 Mixed hyperlipidemia: Secondary | ICD-10-CM | POA: Diagnosis not present

## 2017-12-22 DIAGNOSIS — R69 Illness, unspecified: Secondary | ICD-10-CM | POA: Diagnosis not present

## 2017-12-22 DIAGNOSIS — I38 Endocarditis, valve unspecified: Secondary | ICD-10-CM | POA: Diagnosis not present

## 2017-12-22 DIAGNOSIS — M9909 Segmental and somatic dysfunction of abdomen and other regions: Secondary | ICD-10-CM | POA: Diagnosis not present

## 2017-12-22 DIAGNOSIS — E039 Hypothyroidism, unspecified: Secondary | ICD-10-CM | POA: Diagnosis not present

## 2017-12-22 DIAGNOSIS — M5136 Other intervertebral disc degeneration, lumbar region: Secondary | ICD-10-CM | POA: Diagnosis not present

## 2017-12-22 DIAGNOSIS — I5042 Chronic combined systolic (congestive) and diastolic (congestive) heart failure: Secondary | ICD-10-CM | POA: Diagnosis not present

## 2017-12-22 DIAGNOSIS — M109 Gout, unspecified: Secondary | ICD-10-CM | POA: Diagnosis not present

## 2017-12-22 DIAGNOSIS — I10 Essential (primary) hypertension: Secondary | ICD-10-CM | POA: Diagnosis not present

## 2018-01-14 ENCOUNTER — Ambulatory Visit (INDEPENDENT_AMBULATORY_CARE_PROVIDER_SITE_OTHER): Payer: Medicare HMO | Admitting: *Deleted

## 2018-01-14 ENCOUNTER — Telehealth: Payer: Self-pay | Admitting: Gastroenterology

## 2018-01-14 ENCOUNTER — Encounter: Payer: Self-pay | Admitting: Gastroenterology

## 2018-01-14 ENCOUNTER — Ambulatory Visit: Payer: Medicare HMO | Admitting: Gastroenterology

## 2018-01-14 ENCOUNTER — Encounter: Payer: Self-pay | Admitting: *Deleted

## 2018-01-14 ENCOUNTER — Other Ambulatory Visit: Payer: Self-pay | Admitting: *Deleted

## 2018-01-14 ENCOUNTER — Other Ambulatory Visit (HOSPITAL_COMMUNITY)
Admission: RE | Admit: 2018-01-14 | Discharge: 2018-01-14 | Disposition: A | Discharge status: Home / Self Care | Payer: Medicare HMO | Source: Ambulatory Visit | Attending: Gastroenterology | Admitting: Gastroenterology

## 2018-01-14 VITALS — BP 114/76 | HR 77 | Temp 97.6°F | Ht 68.0 in | Wt 204.8 lb

## 2018-01-14 DIAGNOSIS — K219 Gastro-esophageal reflux disease without esophagitis: Secondary | ICD-10-CM

## 2018-01-14 DIAGNOSIS — I4891 Unspecified atrial fibrillation: Secondary | ICD-10-CM

## 2018-01-14 DIAGNOSIS — Z952 Presence of prosthetic heart valve: Secondary | ICD-10-CM | POA: Diagnosis not present

## 2018-01-14 DIAGNOSIS — D649 Anemia, unspecified: Secondary | ICD-10-CM

## 2018-01-14 DIAGNOSIS — Z5181 Encounter for therapeutic drug level monitoring: Secondary | ICD-10-CM | POA: Diagnosis not present

## 2018-01-14 DIAGNOSIS — R05 Cough: Secondary | ICD-10-CM | POA: Diagnosis not present

## 2018-01-14 DIAGNOSIS — Z7901 Long term (current) use of anticoagulants: Secondary | ICD-10-CM | POA: Diagnosis not present

## 2018-01-14 DIAGNOSIS — Z86010 Personal history of colonic polyps: Secondary | ICD-10-CM

## 2018-01-14 DIAGNOSIS — R1319 Other dysphagia: Secondary | ICD-10-CM

## 2018-01-14 DIAGNOSIS — R059 Cough, unspecified: Secondary | ICD-10-CM

## 2018-01-14 LAB — CBC WITH DIFFERENTIAL/PLATELET
BASOS ABS: 0 10*3/uL (ref 0.0–0.1)
BASOS PCT: 0 %
Eosinophils Absolute: 0.1 10*3/uL (ref 0.0–0.7)
Eosinophils Relative: 1 %
HEMATOCRIT: 38.7 % (ref 36.0–46.0)
HEMOGLOBIN: 11.9 g/dL — AB (ref 12.0–15.0)
Lymphocytes Relative: 33 %
Lymphs Abs: 2.9 10*3/uL (ref 0.7–4.0)
MCH: 28.5 pg (ref 26.0–34.0)
MCHC: 30.7 g/dL (ref 30.0–36.0)
MCV: 92.8 fL (ref 78.0–100.0)
MONOS PCT: 9 %
Monocytes Absolute: 0.8 10*3/uL (ref 0.1–1.0)
NEUTROS ABS: 5 10*3/uL (ref 1.7–7.7)
NEUTROS PCT: 57 %
Platelets: 234 10*3/uL (ref 150–400)
RBC: 4.17 MIL/uL (ref 3.87–5.11)
RDW: 14.4 % (ref 11.5–15.5)
WBC: 8.9 10*3/uL (ref 4.0–10.5)

## 2018-01-14 LAB — POCT INR: INR: 2.9

## 2018-01-14 NOTE — Assessment & Plan Note (Signed)
Patient was due for surveillance colonoscopy last year.  At this time she is not does not want to pursue colonoscopy given her current issues.  Update labs.  Hold off on colonoscopy until patient is ready.

## 2018-01-14 NOTE — Progress Notes (Signed)
Primary Care Physician:  Octavio Graves, DO  Primary Gastroenterologist:  Garfield Cornea, MD   Chief Complaint  Patient presents with  . Dysphagia    coughs a lot and coughs up phlegm    HPI:  [name] is a 67 y.o. female here at the request of Dr. Melina Copa for further evaluation of dysphagia. Patient last seen in 2014. She has h/o remote EGD/ED for peptic stricture/schatki ring.  Last EGD/ED in 2013 two tongues of salmon-colored epithelium distal esophagus, bx not c/w Barrett's. S/p esophageal dilation. Her last colonoscopy was 2013 as well, tubular adenoma removed. Patient was supposed to have f/u TCS in 01/2017. She declines TCS at this time.   Patient states she has felt poorly since having PNA in 09/2017 while [place] vacation at the beach. Still complains of fatigue. Persistent coughing. SOB. Will cough for hours at night. Tends to be mostly after laying down at night. White phlegm comes up. Has tried multiple cough syrups with limited results. She states she was told she may have asthma/bronchitis. Inhalers not helping. Feels like pressure in left side of face.   Prior h/o CHF requiring home oxygen as well.   Feels like choking all the time. Like something cutting her air off. Feels like medicines get hung before swallowing. Once finally swallows them, then she has to use a lot of liquid to wash pills down. Difficult swallowing a lot of different meats. Does ok with hamburger if takes time to chew. Hb controlled.   BM regular. No melena, brbpr. No abd pain.    Current Outpatient Medications  Medication Sig Dispense Refill  . acetaminophen (TYLENOL) 325 MG tablet Take 325 mg by mouth every 6 (six) hours as needed for mild pain or moderate pain.    Marland Kitchen albuterol (PROVENTIL HFA;VENTOLIN HFA) 108 (90 BASE) MCG/ACT inhaler Inhale 1-2 puffs into the lungs every 6 (six) hours as needed for wheezing or shortness of breath.     . allopurinol (ZYLOPRIM) 100 MG tablet Take 100 mg by mouth daily.     Marland Kitchen ALPRAZolam (XANAX) 1 MG tablet Take 1 mg by mouth 3 (three) times daily as needed for anxiety.     . benzonatate (TESSALON) 200 MG capsule Take 1 capsule (200 mg total) by mouth 2 (two) times daily as needed for cough. 20 capsule 0  . calcium carbonate (OS-CAL) 600 MG TABS tablet Take 600 mg by mouth 2 (two) times daily.    . cetirizine (ZYRTEC) 10 MG tablet Take 10 mg by mouth daily.      . clobetasol cream (TEMOVATE) 9.73 % Apply 1 application topically 2 (two) times daily.    . CRESTOR 10 MG tablet Take 10 mg by mouth at bedtime.     . cyclobenzaprine (FLEXERIL) 10 MG tablet Take 10 mg by mouth at bedtime.     . furosemide (LASIX) 40 MG tablet Take 40 mg by mouth 2 (two) times daily.    Marland Kitchen HYDROcodone-acetaminophen (NORCO) 10-325 MG per tablet Take 1 tablet by mouth at bedtime.     Marland Kitchen levothyroxine (SYNTHROID, LEVOTHROID) 50 MCG tablet Take 50 mcg by mouth every morning.     . Magnesium 200 MG TABS Take 1 tablet (200 mg total) by mouth daily. 90 each 3  . metoprolol succinate (TOPROL-XL) 50 MG 24 hr tablet Take 1 tablet (50 mg total) by mouth daily. 30 tablet 0  . Multiple Vitamin (MULTIVITAMIN WITH MINERALS) TABS tablet Take 1 tablet by mouth daily.    Marland Kitchen  omeprazole (PRILOSEC) 20 MG capsule Take 20 mg by mouth daily.     . OXYGEN Inhale into the lungs as needed.    . potassium chloride (K-DUR) 10 MEQ tablet Take 1 tablet (10 mEq total) by mouth daily. 90 tablet 3  . rOPINIRole (REQUIP) 3 MG tablet Take 6 mg by mouth at bedtime.     . temazepam (RESTORIL) 15 MG capsule Take 15 mg by mouth at bedtime.     Marland Kitchen warfarin (COUMADIN) 2 MG tablet Takes one tablet (2mg ) everyday (Patient taking differently: Take 2 mg by mouth daily. Takes one tablet (2mg ) everyday) 45 tablet 3   No current facility-administered medications for this visit.     Allergies as of 01/14/2018 - Review Complete 01/14/2018  Allergen Reaction Noted  . Penicillins Hives, Itching, Swelling, and Rash 06/05/2009    Past  Medical History:  Diagnosis Date  . Acute on chronic diastolic (congestive) heart failure ([place])   . Anxiety disorder   . Aortic stenosis    Status post St. Jude mechanical AVR 2007  . Asthma   . Atrial fibrillation ([place])   . Carcinoid tumor of colon 2007  . Coronary atherosclerosis of native coronary artery    Status post CABG 2007  . GERD (gastroesophageal reflux disease)   . History of colonoscopy 2003   Dr. Gala Romney - normal  . Hyperlipidemia   . Macromastia   . Pedal edema    Bilateral, chronic  . Peptic stricture of esophagus 10/04/2010   GE junction on last EGD by Dr. Gala Romney, benign biopsies  . RLS (restless legs syndrome)   . Schatzki's ring     Past Surgical History:  Procedure Laterality Date  . ABDOMINAL HYSTERECTOMY    . AORTIC VALVE REPLACEMENT  2007   #25 mm St. 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 APPENDECTOMY  2007  . BACK SURGERY     lumbar 4 and 5   . BIOPSY  02/05/2012   RMR:Two tongues of salmon-colored epithelium distal esophagus, very short-segment Barrett's s/p bx/Small hiatal hernia, otherwise normal stomach, D1, D2. Status post esophageal dilation. Biopsy showed GERD.  . Breast cyst removed     bilateral  . BREAST REDUCTION SURGERY    . CESAREAN SECTION    . 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 COLONOSCOPY  02/05/2012   OEV:[id number] rectum, sigmoid diverticulosis,descending colon polyp , tubular adenoma  . CORONARY ARTERY BYPASS GRAFT     06/2006 - RIMA to RCA, SVG to RCA  . ESOPHAGOGASTRODUODENOSCOPY  10/03/2010   Dr. Gala Romney- schatzki's ring, shoft peptic stricture at GE junction.  Marland Kitchen FOOT SURGERY     bilateral bunionectomy  . HERNIA REPAIR     with mesh  . LAPAROTOMY  2007   small bowel resection secondary to small bowel obstruction  . MALONEY DILATION  02/05/2012   Procedure: Venia Minks DILATION;  Surgeon: Daneil Dolin, MD;  Location: [facility];  Service: Endoscopy;  Laterality:  N/A;  56mm   . Teeth removal      Family History  Problem Relation Age of Onset  . Stroke Mother   . Cancer Father   . Anesthesia problems Neg Hx   . Hypotension Neg Hx   . Malignant hyperthermia Neg Hx   . Pseudochol deficiency Neg Hx     Social History   Socioeconomic History  . Marital status: Married    Spouse name: Not on file  .  Number of children: Not on file  . Years of education: Not on file  . Highest education level: Not on file  Occupational History  . Not on file  Social Needs  . Financial resource strain: Not on file  . Food insecurity:    Worry: Not on file    Inability: Not on file  . Transportation needs:    Medical: Not on file    Non-medical: Not on file  Tobacco Use  . Smoking status: Former Smoker    Packs/day: 1.00    Years: 20.00    Pack years: 20.00    Types: Cigarettes    Last attempt to quit: 10/20/1988    Years since quitting: 29.2  . Smokeless tobacco: Never Used  Substance and Sexual Activity  . Alcohol use: No    Alcohol/week: 0.0 oz  . Drug use: No  . Sexual activity: Yes    Partners: Male    Birth control/protection: None    Comment: spouse  Lifestyle  . Physical activity:    Days per week: Not on file    Minutes per session: Not on file  . Stress: Not on file  Relationships  . Social connections:    Talks on phone: Not on file    Gets together: Not on file    Attends religious service: Not on file    Active member of club or organization: Not on file    Attends meetings of clubs or organizations: Not on file    Relationship status: Not on file  . Intimate partner violence:    Fear of current or ex partner: Not on file    Emotionally abused: Not on file    Physically abused: Not on file    Forced sexual activity: Not on file  Other Topics Concern  . Not on file  Social History Narrative  . Not on file      ROS:  General: Negative for anorexia, weight loss, fever, chills, fatigue, +weakness. Eyes: Negative for  vision changes.  ENT: Negative for hoarseness, +difficulty swallowing, +nasal congestion. CV: Negative for chest pain, angina, palpitations, dyspnea on exertion, peripheral edema.  Respiratory: Negative for dyspnea at rest, +dyspnea on exertion, +cough, +sputum, no wheezing.  GI: See history of present illness. GU:  Negative for dysuria, hematuria, urinary incontinence, urinary frequency, nocturnal urination.  MS: Negative for joint pain, low back pain.  Derm: Negative for rash or itching.  Neuro: Negative for weakness, abnormal sensation, seizure, frequent headaches, memory loss, confusion.  Psych: Negative for anxiety, depression, suicidal ideation, hallucinations.  Endo: Negative for unusual weight change.  Heme: Negative for bruising or bleeding. Allergy: Negative for rash or hives.    Physical Examination:  BP 114/76   Pulse 77   Temp 97.6 F (36.4 C) (Oral)   Ht 5\' 8"  (1.727 m)   Wt 204 lb 12.8 oz (92.9 kg)   BMI 31.14 kg/m    General: Well-nourished, well-developed in no acute distress. Accompanied by spouse.   Head: Normocephalic, atraumatic.   Eyes: Conjunctiva pink, no icterus. Mouth: Oropharyngeal mucosa moist and pink , no lesions erythema or exudate. Neck: Supple without thyromegaly, masses, or lymphadenopathy.  Lungs: Clear to auscultation bilaterally.  Heart: Regular rate and rhythm, no murmurs rubs or gallops.  Abdomen: Bowel sounds are normal, nontender, nondistended, no hepatosplenomegaly or masses, no abdominal bruits or    hernia , no rebound or guarding.   Rectal: not performed Extremities: No lower extremity edema. No clubbing or deformities.  Neuro: Alert and oriented x 4 , grossly normal neurologically.  Skin: Warm and dry, no rash or jaundice.   Psych: Alert and cooperative, normal mood and affect.

## 2018-01-14 NOTE — Patient Instructions (Signed)
Upper endoscopy as scheduled. We will work with your heart doctor's office on arranging for Lovenox bridge.   Please have your labs done.

## 2018-01-14 NOTE — Telephone Encounter (Signed)
Hi Leslie. I will take care of her Lovenox bridge. Thanks, Lattie Haw

## 2018-01-14 NOTE — Patient Instructions (Signed)
Continue coumadin 1 tablet daily except 1 1/2 tablets on Mondays, Wednesdays and Fridays Recheck in 4 weeks  

## 2018-01-14 NOTE — Assessment & Plan Note (Signed)
67 year old female presenting for complaints of dysphagia as well as cough.  She has a history of esophageal stricture in the past requiring dilation.  Last time in 2013.  Chronic reflux generally well controlled on PPI.  She had pneumonia 3-4 months ago and ever since then has had persistent cough which is noted only when she lays down at night.  Complains of shortness of breath, weakness/fatigue.  No recent labs available however the last year she had mild anemia with hemoglobin of 10.  Discussed at length with patient and spouse.  Patient does display some solid food esophageal dysphagia symptoms however it is not clear how much of her cough is related to reflux.  She denies any coughing associated with mealtime, therefore oropharyngeal dysphagia less likely.  Wonder if she is having cough related to postnasal drip or pulmonary issues.  Plan on EGD with dilation in the near future.  Will ask cardiology to assist [name] with Lovenox bridging.  For deep sedation given polypharmacy.  I have discussed the risks, alternatives, benefits with regards to but not limited to the risk of reaction to medication, bleeding, infection, perforation and the patient is agreeable to proceed. Written consent to be obtained.  We will update anemia labs.

## 2018-01-14 NOTE — Telephone Encounter (Signed)
Patient is scheduled for EGD with esophageal dilation on April 22.  Will you please assist [name] with management of her anticoagulation.  We typically hold Coumadin for 4 days prior to procedure but I anticipate that she would need Lovenox bridge given her cardiac history.

## 2018-01-15 ENCOUNTER — Telehealth: Payer: Self-pay | Admitting: *Deleted

## 2018-01-15 LAB — IRON AND TIBC
Iron: 99 ug/dL (ref 28–170)
Saturation Ratios: 21 % (ref 10.4–31.8)
TIBC: 475 ug/dL — ABNORMAL HIGH (ref 250–450)
UIBC: 376 ug/dL

## 2018-01-15 LAB — FERRITIN: Ferritin: 25 ng/mL (ref 11–307)

## 2018-01-15 NOTE — Progress Notes (Signed)
cc'd to pcp 

## 2018-01-15 NOTE — Telephone Encounter (Signed)
Pre-op is scheduled for 02/02/18 at 12:45pm. Patient aware. Letter mailed.

## 2018-01-17 DIAGNOSIS — Z7901 Long term (current) use of anticoagulants: Secondary | ICD-10-CM | POA: Diagnosis not present

## 2018-01-17 DIAGNOSIS — I5033 Acute on chronic diastolic (congestive) heart failure: Secondary | ICD-10-CM | POA: Diagnosis not present

## 2018-01-17 DIAGNOSIS — J441 Chronic obstructive pulmonary disease with (acute) exacerbation: Secondary | ICD-10-CM | POA: Diagnosis not present

## 2018-01-17 NOTE — Progress Notes (Signed)
Mildly low Hgb with normal HC. Much improved.  Repeat CBC in 3 months.

## 2018-01-17 NOTE — H&P (View-Only) (Signed)
Mildly low Hgb with normal HC. Much improved.  Repeat CBC in 3 months.

## 2018-01-18 ENCOUNTER — Other Ambulatory Visit: Payer: Self-pay

## 2018-01-18 DIAGNOSIS — D649 Anemia, unspecified: Secondary | ICD-10-CM

## 2018-01-19 DIAGNOSIS — R69 Illness, unspecified: Secondary | ICD-10-CM | POA: Diagnosis not present

## 2018-01-19 DIAGNOSIS — M9903 Segmental and somatic dysfunction of lumbar region: Secondary | ICD-10-CM | POA: Diagnosis not present

## 2018-01-19 DIAGNOSIS — M9909 Segmental and somatic dysfunction of abdomen and other regions: Secondary | ICD-10-CM | POA: Diagnosis not present

## 2018-01-19 DIAGNOSIS — M5136 Other intervertebral disc degeneration, lumbar region: Secondary | ICD-10-CM | POA: Diagnosis not present

## 2018-01-19 DIAGNOSIS — E039 Hypothyroidism, unspecified: Secondary | ICD-10-CM | POA: Diagnosis not present

## 2018-01-19 DIAGNOSIS — E782 Mixed hyperlipidemia: Secondary | ICD-10-CM | POA: Diagnosis not present

## 2018-01-19 DIAGNOSIS — M109 Gout, unspecified: Secondary | ICD-10-CM | POA: Diagnosis not present

## 2018-01-19 DIAGNOSIS — I5042 Chronic combined systolic (congestive) and diastolic (congestive) heart failure: Secondary | ICD-10-CM | POA: Diagnosis not present

## 2018-01-19 DIAGNOSIS — I10 Essential (primary) hypertension: Secondary | ICD-10-CM | POA: Diagnosis not present

## 2018-01-19 DIAGNOSIS — I38 Endocarditis, valve unspecified: Secondary | ICD-10-CM | POA: Diagnosis not present

## 2018-01-20 ENCOUNTER — Encounter: Payer: Self-pay | Admitting: *Deleted

## 2018-01-20 NOTE — Progress Notes (Signed)
This encounter was created in error - please disregard.

## 2018-02-01 NOTE — Patient Instructions (Addendum)
[name]  02/01/2018     @PREFPERIOPPHARMACY @   Your procedure is scheduled on  02/08/2018 .  Report to [facility] at  645   A.M.  Call this number if you have problems the morning of surgery:  [phone]   Remember:  Do not eat food or drink liquids after midnight.  Take these medicines the morning of surgery with A SIP OF WATER  Allopurinol, xanax, zyrtec, flexaril, hydrocodone, levothyroxine, metoprolol, omeprazole. Use your inhaler before you come.   Do not wear jewelry, make-up or nail polish.  Do not wear lotions, powders, or perfumes, or deodorant.  Do not shave 48 hours prior to surgery.  Men may shave face and neck.  Do not bring valuables to the hospital.  [facility] is not responsible for any belongings or valuables.  Contacts, dentures or bridgework may not be worn into surgery.  Leave your suitcase in the car.  After surgery it may be brought to your room.  For patients admitted to the hospital, discharge time will be determined by your treatment team.  Patients discharged the day of surgery will not be allowed to drive home.   Name and phone number of your driver:   family Special instructions:  Follow the diet instructions given to you by Dr Roseanne Kaufman office.  Please read over the following fact sheets that you were given. Anesthesia Post-op Instructions and Care and Recovery After Surgery       Esophagogastroduodenoscopy Esophagogastroduodenoscopy (EGD) is a procedure to examine the lining of the esophagus, stomach, and first part of the small intestine (duodenum). This procedure is done to check for problems such as inflammation, bleeding, ulcers, or growths. During this procedure, a long, flexible, lighted tube with a camera attached (endoscope) is inserted down the throat. Tell a health care provider about:  Any allergies you have.  All medicines you are taking, including vitamins, herbs, eye drops, creams, and  over-the-counter medicines.  Any problems you or family members have had with anesthetic medicines.  Any blood disorders you have.  Any surgeries you have had.  Any medical conditions you have.  Whether you are pregnant or may be pregnant. What are the risks? Generally, this is a safe procedure. However, problems may occur, including:  Infection.  Bleeding.  A tear (perforation) in the esophagus, stomach, or duodenum.  Trouble breathing.  Excessive sweating.  Spasms of the larynx.  A slowed heartbeat.  Low blood pressure.  What happens before the procedure?  Follow instructions from your health care provider about eating or drinking restrictions.  Ask your health care provider about: ? Changing or stopping your regular medicines. This is especially important if you are taking diabetes medicines or blood thinners. ? Taking medicines such as aspirin and ibuprofen. These medicines can thin your blood. Do not take these medicines before your procedure if your health care provider instructs you not to.  Plan to have someone take you home after the procedure.  If you wear dentures, be ready to remove them before the procedure. What happens during the procedure?  To reduce your risk of infection, your health care team will wash or sanitize their hands.  An IV tube will be put in a vein in your hand or arm. You will get medicines and fluids through this tube.  You will be given one or more of the following: ? A medicine to help you relax (sedative). ?  A medicine to numb the area (local anesthetic). This medicine may be sprayed into your throat. It will make you feel more comfortable and keep you from gagging or coughing during the procedure. ? A medicine for pain.  A mouth guard may be placed in your mouth to protect your teeth and to keep you from biting on the endoscope.  You will be asked to lie on your left side.  The endoscope will be lowered down your throat into  your esophagus, stomach, and duodenum.  Air will be put into the endoscope. This will help your health care provider see better.  The lining of your esophagus, stomach, and duodenum will be examined.  Your health care provider may: ? Take a tissue sample so it can be looked at in a lab (biopsy). ? Remove growths. ? Remove objects (foreign bodies) that are stuck. ? Treat any bleeding with medicines or other devices that stop tissue from bleeding. ? Widen (dilate) or stretch narrowed areas of your esophagus and stomach.  The endoscope will be taken out. The procedure may vary among health care providers and hospitals. What happens after the procedure?  Your blood pressure, heart rate, breathing rate, and blood oxygen level will be monitored often until the medicines you were given have worn off.  Do not eat or drink anything until the numbing medicine has worn off and your gag reflex has returned. This information is not intended to replace advice given to you by your health care provider. Make sure you discuss any questions you have with your health care provider. Document Released: 02/06/2005 Document Revised: 03/13/2016 Document Reviewed: 08/30/2015 Elsevier Interactive Patient Education  2018 [employer]. Esophagogastroduodenoscopy, Care After Refer to this sheet in the next few weeks. These instructions provide you with information about caring for yourself after your procedure. Your health care provider may also give you more specific instructions. Your treatment has been planned according to current medical practices, but problems sometimes occur. Call your health care provider if you have any problems or questions after your procedure. What can I expect after the procedure? After the procedure, it is common to have:  A sore throat.  Nausea.  Bloating.  Dizziness.  Fatigue.  Follow these instructions at home:  Do not eat or drink anything until the numbing medicine  (local anesthetic) has worn off and your gag reflex has returned. You will know that the local anesthetic has worn off when you can swallow comfortably.  Do not drive for 24 hours if you received a medicine to help you relax (sedative).  If your health care provider took a tissue sample for testing during the procedure, make sure to get your test results. This is your responsibility. Ask your health care provider or the department performing the test when your results will be ready.  Keep all follow-up visits as told by your health care provider. This is important. Contact a health care provider if:  You cannot stop coughing.  You are not urinating.  You are urinating less than usual. Get help right away if:  You have trouble swallowing.  You cannot eat or drink.  You have throat or chest pain that gets worse.  You are dizzy or light-headed.  You faint.  You have nausea or vomiting.  You have chills.  You have a fever.  You have severe abdominal pain.  You have black, tarry, or bloody stools. This information is not intended to replace advice given to you by your health  care provider. Make sure you discuss any questions you have with your health care provider. Document Released: 09/22/2012 Document Revised: 03/13/2016 Document Reviewed: 08/30/2015 Elsevier Interactive Patient Education  2018 [employer].  Esophageal Dilatation Esophageal dilatation is a procedure to open a blocked or narrowed part of the esophagus. The esophagus is the long tube in your throat that carries food and liquid from your mouth to your stomach. The procedure is also called esophageal dilation. You may need this procedure if you have a buildup of scar tissue in your esophagus that makes it difficult, painful, or even impossible to swallow. This can be caused by gastroesophageal reflux disease (GERD). In rare cases, people need this procedure because they have cancer of the esophagus or a problem  with the way food moves through the esophagus. Sometimes you may need to have another dilatation to enlarge the opening of the esophagus gradually. Tell a health care provider about:  Any allergies you have.  All medicines you are taking, including vitamins, herbs, eye drops, creams, and over-the-counter medicines.  Any problems you or family members have had with anesthetic medicines.  Any blood disorders you have.  Any surgeries you have had.  Any medical conditions you have.  Any antibiotic medicines you are required to take before dental procedures. What are the risks? Generally, this is a safe procedure. However, problems can occur and include:  Bleeding from a tear in the lining of the esophagus.  A hole (perforation) in the esophagus.  What happens before the procedure?  Do not eat or drink anything after midnight on the night before the procedure or as directed by your health care provider.  Ask your health care provider about changing or stopping your regular medicines. This is especially important if you are taking diabetes medicines or blood thinners.  Plan to have someone take you home after the procedure. What happens during the procedure?  You will be given a medicine that makes you relaxed and sleepy (sedative).  A medicine may be sprayed or gargled to numb the back of the throat.  Your health care provider can use various instruments to do an esophageal dilatation. During the procedure, the instrument used will be placed in your mouth and passed down into your esophagus. Options include: ? Simple dilators. This instrument is carefully placed in the esophagus to stretch it. ? Guided wire bougies. In this method, a flexible tube (endoscope) is used to insert a wire into the esophagus. The dilator is passed over this wire to enlarge the esophagus. Then the wire is removed. ? Balloon dilators. An endoscope with a small balloon at the end is passed down into the  esophagus. Inflating the balloon gently stretches the esophagus and opens it up. What happens after the procedure?  Your blood pressure, heart rate, breathing rate, and blood oxygen level will be monitored often until the medicines you were given have worn off.  Your throat may feel slightly sore and will probably still feel numb. This will improve slowly over time.  You will not be allowed to eat or drink until the throat numbness has resolved.  If this is a same-day procedure, you may be allowed to go home once you have been able to drink, urinate, and sit on the edge of the bed without nausea or dizziness.  If this is a same-day procedure, you should have a friend or family member with you for the next 24 hours after the procedure. This information is not intended to  replace advice given to you by your health care provider. Make sure you discuss any questions you have with your health care provider. Document Released: 11/27/2005 Document Revised: 03/13/2016 Document Reviewed: 02/15/2014 Elsevier Interactive Patient Education  2018 [place] Anesthesia is a term that refers to techniques, procedures, and medicines that help a person stay safe and comfortable during a medical procedure. Monitored anesthesia care, or sedation, is one type of anesthesia. Your anesthesia specialist may recommend sedation if you will be having a procedure that does not require you to be unconscious, such as:  Cataract surgery.  A dental procedure.  A biopsy.  A colonoscopy.  During the procedure, you may receive a medicine to help you relax (sedative). There are three levels of sedation:  Mild sedation. At this level, you may feel awake and relaxed. You will be able to follow directions.  Moderate sedation. At this level, you will be sleepy. You may not remember the procedure.  Deep sedation. At this level, you will be asleep. You will not remember the procedure.  The  more medicine you are given, the deeper your level of sedation will be. Depending on how you respond to the procedure, the anesthesia specialist may change your level of sedation or the type of anesthesia to fit your needs. An anesthesia specialist will monitor you closely during the procedure. Let your health care provider know about:  Any allergies you have.  All medicines you are taking, including vitamins, herbs, eye drops, creams, and over-the-counter medicines.  Any use of steroids (by mouth or as a cream).  Any problems you or family members have had with sedatives and anesthetic medicines.  Any blood disorders you have.  Any surgeries you have had.  Any medical conditions you have, such as sleep apnea.  Whether you are pregnant or may be pregnant.  Any use of cigarettes, alcohol, or street drugs. What are the risks? Generally, this is a safe procedure. However, problems may occur, including:  Getting too much medicine (oversedation).  Nausea.  Allergic reaction to medicines.  Trouble breathing. If this happens, a breathing tube may be used to help with breathing. It will be removed when you are awake and breathing on your own.  Heart trouble.  Lung trouble.  Before the procedure Staying hydrated Follow instructions from your health care provider about hydration, which may include:  Up to 2 hours before the procedure - you may continue to drink clear liquids, such as water, clear fruit juice, black coffee, and plain tea.  Eating and drinking restrictions Follow instructions from your health care provider about eating and drinking, which may include:  8 hours before the procedure - stop eating heavy meals or foods such as meat, fried foods, or fatty foods.  6 hours before the procedure - stop eating light meals or foods, such as toast or cereal.  6 hours before the procedure - stop drinking milk or drinks that contain milk.  2 hours before the procedure - stop  drinking clear liquids.  Medicines Ask your health care provider about:  Changing or stopping your regular medicines. This is especially important if you are taking diabetes medicines or blood thinners.  Taking medicines such as aspirin and ibuprofen. These medicines can thin your blood. Do not take these medicines before your procedure if your health care provider instructs you not to.  Tests and exams  You will have a physical exam.  You may have blood tests done to show: ?  How well your kidneys and liver are working. ? How well your blood can clot.  General instructions  Plan to have someone take you home from the hospital or clinic.  If you will be going home right after the procedure, plan to have someone with you for 24 hours.  What happens during the procedure?  Your blood pressure, heart rate, breathing, level of pain and overall condition will be monitored.  An IV tube will be inserted into one of your veins.  Your anesthesia specialist will give you medicines as needed to keep you comfortable during the procedure. This may mean changing the level of sedation.  The procedure will be performed. After the procedure  Your blood pressure, heart rate, breathing rate, and blood oxygen level will be monitored until the medicines you were given have worn off.  Do not drive for 24 hours if you received a sedative.  You may: ? Feel sleepy, clumsy, or nauseous. ? Feel forgetful about what happened after the procedure. ? Have a sore throat if you had a breathing tube during the procedure. ? Vomit. This information is not intended to replace advice given to you by your health care provider. Make sure you discuss any questions you have with your health care provider. Document Released: 07/02/2005 Document Revised: 03/14/2016 Document Reviewed: 01/27/2016 Elsevier Interactive Patient Education  2018 [place], Care After These instructions  provide you with information about caring for yourself after your procedure. Your health care provider may also give you more specific instructions. Your treatment has been planned according to current medical practices, but problems sometimes occur. Call your health care provider if you have any problems or questions after your procedure. What can I expect after the procedure? After your procedure, it is common to:  Feel sleepy for several hours.  Feel clumsy and have poor balance for several hours.  Feel forgetful about what happened after the procedure.  Have poor judgment for several hours.  Feel nauseous or vomit.  Have a sore throat if you had a breathing tube during the procedure.  Follow these instructions at home: For at least 24 hours after the procedure:   Do not: ? Participate in activities in which you could fall or become injured. ? Drive. ? Use heavy machinery. ? Drink alcohol. ? Take sleeping pills or medicines that cause drowsiness. ? Make important decisions or sign legal documents. ? Take care of children on your own.  Rest. Eating and drinking  Follow the diet that is recommended by your health care provider.  If you vomit, drink water, juice, or soup when you can drink without vomiting.  Make sure you have little or no nausea before eating solid foods. General instructions  Have a responsible adult stay with you until you are awake and alert.  Take over-the-counter and prescription medicines only as told by your health care provider.  If you smoke, do not smoke without supervision.  Keep all follow-up visits as told by your health care provider. This is important. Contact a health care provider if:  You keep feeling nauseous or you keep vomiting.  You feel light-headed.  You develop a rash.  You have a fever. Get help right away if:  You have trouble breathing. This information is not intended to replace advice given to you by your health  care provider. Make sure you discuss any questions you have with your health care provider. Document Released: 01/27/2016 Document Revised: 05/28/2016 Document Reviewed: 01/27/2016  Chartered certified [employer] Patient Education  [employer].

## 2018-02-02 ENCOUNTER — Other Ambulatory Visit: Payer: Self-pay

## 2018-02-02 ENCOUNTER — Encounter (HOSPITAL_COMMUNITY)
Admission: RE | Admit: 2018-02-02 | Discharge: 2018-02-02 | Disposition: A | Discharge status: Home / Self Care | Payer: Medicare HMO | Source: Ambulatory Visit | Attending: Internal Medicine | Admitting: Internal Medicine

## 2018-02-02 ENCOUNTER — Encounter (HOSPITAL_COMMUNITY): Payer: Self-pay

## 2018-02-02 ENCOUNTER — Ambulatory Visit (INDEPENDENT_AMBULATORY_CARE_PROVIDER_SITE_OTHER): Payer: Medicare HMO | Admitting: *Deleted

## 2018-02-02 DIAGNOSIS — R9431 Abnormal electrocardiogram [ECG] [EKG]: Secondary | ICD-10-CM | POA: Insufficient documentation

## 2018-02-02 DIAGNOSIS — Z952 Presence of prosthetic heart valve: Secondary | ICD-10-CM | POA: Diagnosis not present

## 2018-02-02 DIAGNOSIS — Z7901 Long term (current) use of anticoagulants: Secondary | ICD-10-CM

## 2018-02-02 DIAGNOSIS — I4891 Unspecified atrial fibrillation: Secondary | ICD-10-CM

## 2018-02-02 DIAGNOSIS — Z5181 Encounter for therapeutic drug level monitoring: Secondary | ICD-10-CM | POA: Diagnosis not present

## 2018-02-02 DIAGNOSIS — Z01810 Encounter for preprocedural cardiovascular examination: Secondary | ICD-10-CM | POA: Diagnosis not present

## 2018-02-02 DIAGNOSIS — Z01812 Encounter for preprocedural laboratory examination: Secondary | ICD-10-CM | POA: Insufficient documentation

## 2018-02-02 LAB — CBC WITH DIFFERENTIAL/PLATELET
Basophils Absolute: 0 10*3/uL (ref 0.0–0.1)
Basophils Relative: 0 %
Eosinophils Absolute: 0.1 10*3/uL (ref 0.0–0.7)
Eosinophils Relative: 1 %
HEMATOCRIT: 36.5 % (ref 36.0–46.0)
HEMOGLOBIN: 11 g/dL — AB (ref 12.0–15.0)
LYMPHS ABS: 2 10*3/uL (ref 0.7–4.0)
LYMPHS PCT: 25 %
MCH: 28.6 pg (ref 26.0–34.0)
MCHC: 30.1 g/dL (ref 30.0–36.0)
MCV: 94.8 fL (ref 78.0–100.0)
MONOS PCT: 11 %
Monocytes Absolute: 0.9 10*3/uL (ref 0.1–1.0)
NEUTROS ABS: 5.2 10*3/uL (ref 1.7–7.7)
NEUTROS PCT: 63 %
Platelets: 210 10*3/uL (ref 150–400)
RBC: 3.85 MIL/uL — ABNORMAL LOW (ref 3.87–5.11)
RDW: 14.6 % (ref 11.5–15.5)
WBC: 8.2 10*3/uL (ref 4.0–10.5)

## 2018-02-02 LAB — BASIC METABOLIC PANEL
Anion gap: 11 (ref 5–15)
BUN: 12 mg/dL (ref 6–20)
CHLORIDE: 97 mmol/L — AB (ref 101–111)
CO2: 31 mmol/L (ref 22–32)
CREATININE: 1.08 mg/dL — AB (ref 0.44–1.00)
Calcium: 9.1 mg/dL (ref 8.9–10.3)
GFR calc Af Amer: >60 mL/min (ref >60)
GFR calc non Af Amer: 52 mL/min — ABNORMAL LOW (ref >60)
GLUCOSE: 63 mg/dL — AB (ref 65–99)
Potassium: 3.4 mmol/L — ABNORMAL LOW (ref 3.5–5.1)
Sodium: 139 mmol/L (ref 135–145)

## 2018-02-02 LAB — POCT INR: INR: 2.8

## 2018-02-02 MED ORDER — ENOXAPARIN SODIUM 100 MG/ML ~~LOC~~ SOLN: 100.0000 mg | Freq: Two times a day (BID) | SUBCUTANEOUS | 0 refills | Status: DC

## 2018-02-02 NOTE — Patient Instructions (Addendum)
Scheduled for EGD on 02/08/18  Wt 93kg   Labs 02/02/18:  SCr 1.08  CrCl 75.23  Hgb 11.0  Hgb 36.5  4/16  INR 2.8  Last dose of coumadin 4/17  No lovenox or coumadin 4/18  Lovenox 100mg  SQ at 7am & 7pm 4/19   Lovenox 100mg  SQ at 7am & 7pm 4/20   Lovenox 100mg  SQ at 7am & 7pm 4/21   Lovenox 100mg  SQ at 7am & NO lovenox in pm 4/22  No Lovenox in am------procedure-------coumadin 3mg  pm 4/23   Lovenox 100mg  SQ at 7am & 7pm and coumadin 3mg  pm 4/24   Lovenox 100mg  SQ at 7am & 7pm and coumadin 3mg  pm 4/25   Lovenox 100mg  SQ at 7am & INR appt at 3:15pm

## 2018-02-08 ENCOUNTER — Ambulatory Visit (HOSPITAL_COMMUNITY): Payer: Medicare HMO | Admitting: Anesthesiology

## 2018-02-08 ENCOUNTER — Ambulatory Visit (HOSPITAL_COMMUNITY)
Admission: RE | Admit: 2018-02-08 | Discharge: 2018-02-08 | Disposition: A | Discharge status: Home / Self Care | Payer: Medicare HMO | Source: Ambulatory Visit | Attending: Internal Medicine | Admitting: Internal Medicine

## 2018-02-08 ENCOUNTER — Encounter (HOSPITAL_COMMUNITY): Payer: Self-pay

## 2018-02-08 ENCOUNTER — Encounter (HOSPITAL_COMMUNITY)
Admission: RE | Disposition: A | Discharge status: Home / Self Care | Payer: Self-pay | Source: Ambulatory Visit | Attending: Internal Medicine

## 2018-02-08 DIAGNOSIS — G4730 Sleep apnea, unspecified: Secondary | ICD-10-CM | POA: Insufficient documentation

## 2018-02-08 DIAGNOSIS — I4891 Unspecified atrial fibrillation: Secondary | ICD-10-CM | POA: Insufficient documentation

## 2018-02-08 DIAGNOSIS — K449 Diaphragmatic hernia without obstruction or gangrene: Secondary | ICD-10-CM | POA: Diagnosis not present

## 2018-02-08 DIAGNOSIS — I739 Peripheral vascular disease, unspecified: Secondary | ICD-10-CM | POA: Insufficient documentation

## 2018-02-08 DIAGNOSIS — K210 Gastro-esophageal reflux disease with esophagitis: Secondary | ICD-10-CM | POA: Insufficient documentation

## 2018-02-08 DIAGNOSIS — J449 Chronic obstructive pulmonary disease, unspecified: Secondary | ICD-10-CM | POA: Insufficient documentation

## 2018-02-08 DIAGNOSIS — I5033 Acute on chronic diastolic (congestive) heart failure: Secondary | ICD-10-CM | POA: Insufficient documentation

## 2018-02-08 DIAGNOSIS — Z79899 Other long term (current) drug therapy: Secondary | ICD-10-CM | POA: Insufficient documentation

## 2018-02-08 DIAGNOSIS — Z9981 Dependence on supplemental oxygen: Secondary | ICD-10-CM | POA: Insufficient documentation

## 2018-02-08 DIAGNOSIS — Z79890 Hormone replacement therapy (postmenopausal): Secondary | ICD-10-CM | POA: Diagnosis not present

## 2018-02-08 DIAGNOSIS — G2581 Restless legs syndrome: Secondary | ICD-10-CM | POA: Insufficient documentation

## 2018-02-08 DIAGNOSIS — K2210 Ulcer of esophagus without bleeding: Secondary | ICD-10-CM | POA: Insufficient documentation

## 2018-02-08 DIAGNOSIS — Z7901 Long term (current) use of anticoagulants: Secondary | ICD-10-CM | POA: Insufficient documentation

## 2018-02-08 DIAGNOSIS — K222 Esophageal obstruction: Secondary | ICD-10-CM | POA: Diagnosis not present

## 2018-02-08 DIAGNOSIS — Z79891 Long term (current) use of opiate analgesic: Secondary | ICD-10-CM | POA: Insufficient documentation

## 2018-02-08 DIAGNOSIS — I2510 Atherosclerotic heart disease of native coronary artery without angina pectoris: Secondary | ICD-10-CM | POA: Diagnosis not present

## 2018-02-08 DIAGNOSIS — Z87891 Personal history of nicotine dependence: Secondary | ICD-10-CM | POA: Insufficient documentation

## 2018-02-08 DIAGNOSIS — F419 Anxiety disorder, unspecified: Secondary | ICD-10-CM | POA: Diagnosis not present

## 2018-02-08 DIAGNOSIS — R1310 Dysphagia, unspecified: Secondary | ICD-10-CM | POA: Diagnosis not present

## 2018-02-08 DIAGNOSIS — Z955 Presence of coronary angioplasty implant and graft: Secondary | ICD-10-CM | POA: Diagnosis not present

## 2018-02-08 HISTORY — PX: ESOPHAGOGASTRODUODENOSCOPY (EGD) WITH PROPOFOL: SHX5813

## 2018-02-08 HISTORY — PX: MALONEY DILATION: SHX5535

## 2018-02-08 SURGERY — ESOPHAGOGASTRODUODENOSCOPY (EGD) WITH PROPOFOL
Anesthesia: General

## 2018-02-08 MED ORDER — LACTATED RINGERS IV SOLN
INTRAVENOUS | Status: DC
  Administered 2018-02-08 (×2): via INTRAVENOUS

## 2018-02-08 MED ORDER — LACTATED RINGERS IV SOLN: INTRAVENOUS | Status: DC

## 2018-02-08 MED ORDER — PROPOFOL 500 MG/50ML IV EMUL
INTRAVENOUS | Status: DC | PRN
  Administered 2018-02-08: 75 ug/kg/min via INTRAVENOUS
  Administered 2018-02-08: 150 ug/kg/min via INTRAVENOUS

## 2018-02-08 MED ORDER — LIDOCAINE HCL (CARDIAC) PF 50 MG/5ML IV SOSY
PREFILLED_SYRINGE | INTRAVENOUS | Status: DC | PRN
  Administered 2018-02-08: 30 mg via INTRAVENOUS

## 2018-02-08 MED ORDER — LIDOCAINE VISCOUS 2 % MT SOLN
15.0000 mL | Freq: Once | OROMUCOSAL | Status: AC
  Administered 2018-02-08: 15 mL via OROMUCOSAL

## 2018-02-08 MED ORDER — LIDOCAINE VISCOUS 2 % MT SOLN
OROMUCOSAL | Status: AC
  Filled 2018-02-08: qty 15

## 2018-02-08 MED ORDER — GLYCOPYRROLATE 0.2 MG/ML IJ SOLN
INTRAMUSCULAR | Status: AC
  Filled 2018-02-08: qty 2

## 2018-02-08 MED ORDER — PROPOFOL 10 MG/ML IV BOLUS
INTRAVENOUS | Status: AC
  Filled 2018-02-08: qty 40

## 2018-02-08 NOTE — Op Note (Signed)
[facility] Patient Name: [name] Procedure Date: 02/08/2018 8:12 AM MRN: [id number] Date of Birth: 1951-05-12 Attending MD: Norvel Richards , MD CSN: [id number] Age: 67 Admit Type: Outpatient Procedure:                Upper GI endoscopy Indications:              Dysphagia Providers:                Norvel Richards, MD, Lurline Del, RN, Aram Candela Referring MD:              Medicines:                Propofol per Anesthesia Complications:            No immediate complications. Estimated Blood Loss:     Estimated blood loss was minimal. Procedure:                After obtaining informed consent, the endoscope was                            passed under direct vision. Throughout the                            procedure, the patient's blood pressure, pulse, and                            oxygen saturations were monitored continuously. The                            [id number]) scope was introduced through the                            and advanced to the second part of duodenum. Scope In: 8:35:47 AM Scope Out: 8:40:01 AM Total Procedure Duration: 0 hours 4 minutes 14 seconds  Findings:      One mild stenosis was found. The stenosis was traversed without       difficulty. Mild peptic stricture present. Accentuated undulating Z line       present.Localized distal esophageal erosions. No Barrett's epithelium       seen. No tumor seen..      A small hiatal hernia was present.      The duodenal bulb and second portion of the duodenum were normal. The       scope was withdrawn. Dilation was performed with a Maloney dilator with       mild resistance at 56 Fr. The dilation site was examined following       endoscope reinsertion and showed moderate mucosal disruption. Estimated       blood loss was minimal. 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 Dilated.                       -  Small hiatal hernia.                           - Normal duodenal bulb and second portion of the                            duodenum.                           - No specimens collected. Moderate Sedation:      Moderate (conscious) sedation was personally administered by an       anesthesia professional. The following parameters were monitored: oxygen       saturation, heart rate, blood pressure, respiratory rate, EKG, adequacy       of pulmonary ventilation, and response to care. Total physician       intraservice time was 7 minutes. Recommendation:           - Patient has a contact number available for                            emergencies. The signs and symptoms of potential                            delayed complications were discussed with the                            patient. Return to normal activities tomorrow.                            Written discharge instructions were provided to the                            patient.                           - Advance diet as tolerated.                           - Continue present medications. Increase omeprazole                            to 40 mg once daily. Patient has complaints of left                            neck swelling and persistent cough. I have                            recommended she follow up with Dr. Melina Copa.                           - No repeat upper endoscopy. Resume Coumadin and                            Lovenox today. Follow instructions in Coumadin  clinic regarding Lovenox dosing.                           - Return to GI office in 3 months. Procedure Code(s):        --- Professional ---                           [phone], Esophagogastroduodenoscopy, flexible,                            transoral; diagnostic, including collection of                            specimen(s) by brushing or washing, when performed                            (separate procedure)                            43450, Dilation of esophagus, by unguided sound or                            bougie, single or multiple passes Diagnosis Code(s):        --- Professional ---                           K22.2, Esophageal obstruction                           K44.9, Diaphragmatic hernia without obstruction or                            gangrene                           R13.10, Dysphagia, unspecified CPT copyright 2017 American Medical Association. All rights reserved. The codes documented in this report are preliminary and upon coder review may  be revised to meet current compliance requirements. Cristopher Estimable. Daymion Nazaire, MD Norvel Richards, MD 02/08/2018 8:51:57 AM This report has been signed electronically. Number of Addenda: 0

## 2018-02-08 NOTE — Transfer of Care (Signed)
Immediate Anesthesia Transfer of Care Note  Patient: [name]  Procedure(s) Performed: ESOPHAGOGASTRODUODENOSCOPY (EGD) WITH PROPOFOL (N/A ) MALONEY DILATION (N/A )  Patient Location: PACU  Anesthesia Type:MAC  Level of Consciousness: awake, alert , oriented and patient cooperative  Airway & Oxygen Therapy: Patient Spontanous Breathing and Patient connected to nasal cannula oxygen  Post-op Assessment: Report given to RN and Post -op Vital signs reviewed and stable  Post vital signs: Reviewed and stable  Last Vitals:  Vitals Value Taken Time  BP    Temp    Pulse 93 02/08/2018  8:49 AM  Resp 9 02/08/2018  8:49 AM  SpO2 93 % 02/08/2018  8:49 AM  Vitals shown include unvalidated device data.  Last Pain:  Vitals:   02/08/18 0718  TempSrc: Oral  PainSc: 0-No pain      Patients Stated Pain Goal: 7 (99/83/38 2505)  Complications: No apparent anesthesia complications

## 2018-02-08 NOTE — Anesthesia Procedure Notes (Signed)
Procedure Name: MAC Date/Time: 02/08/2018 8:32 AM Performed by: Andree Elk Amy A, CRNA Pre-anesthesia Checklist: Patient identified, Emergency Drugs available, Suction available, Patient being monitored and Timeout performed Oxygen Delivery Method: Simple face mask

## 2018-02-08 NOTE — Addendum Note (Signed)
Addendum  created 02/08/18 0905 by Nicanor Alcon, MD   Order list changed, Order sets accessed

## 2018-02-08 NOTE — Discharge Instructions (Signed)
EGD Discharge instructions Please read the instructions outlined below and refer to this sheet in the next few weeks. These discharge instructions provide you with general information on caring for yourself after you leave the hospital. Your doctor may also give you specific instructions. While your treatment has been planned according to the most current medical practices available, unavoidable complications occasionally occur. If you have any problems or questions after discharge, please call your doctor. ACTIVITY  You may resume your regular activity but move at a slower pace for the next 24 hours.   Take frequent rest periods for the next 24 hours.   Walking will help expel (get rid of) the air and reduce the bloated feeling in your abdomen.   No driving for 24 hours (because of the anesthesia (medicine) used during the test).   You may shower.   Do not sign any important legal documents or operate any machinery for 24 hours (because of the anesthesia used during the test).  NUTRITION  Drink plenty of fluids.   You may resume your normal diet.   Begin with a light meal and progress to your normal diet.   Avoid alcoholic beverages for 24 hours or as instructed by your caregiver.  MEDICATIONS  You may resume your normal medications unless your caregiver tells you otherwise.  WHAT YOU CAN EXPECT TODAY  You may experience abdominal discomfort such as a feeling of fullness or gas pains.  FOLLOW-UP  Your doctor will discuss the results of your test with you.  SEEK IMMEDIATE MEDICAL ATTENTION IF ANY OF THE FOLLOWING OCCUR:  Excessive nausea (feeling sick to your stomach) and/or vomiting.   Severe abdominal pain and distention (swelling).   Trouble swallowing.   Temperature over 101 F (37.8 C).   Rectal bleeding or vomiting of blood.    GERD information provided  Increase omeprazole to 40 mg daily  Resume Coumadin today;  Resume Lovenox today;  follow up with the  Coumadin clinic for further instructions on Lovenox dosing.  See Dr. Melina Copa about cough/neck swelling  Office visit with [name] in 3 months     Gastroesophageal Reflux Disease, Adult Normally, food travels down the esophagus and stays in the stomach to be digested. If a person has gastroesophageal reflux disease (GERD), food and stomach acid move back up into the esophagus. When this happens, the esophagus becomes sore and swollen (inflamed). Over time, GERD can make small holes (ulcers) in the lining of the esophagus. Follow these instructions at home: Diet  Follow a diet as told by your doctor. You may need to avoid foods and drinks such as: ? Coffee and tea (with or without caffeine). ? Drinks that contain alcohol. ? Energy drinks and sports drinks. ? Carbonated drinks or sodas. ? Chocolate and cocoa. ? Peppermint and mint flavorings. ? Garlic and onions. ? Horseradish. ? Spicy and acidic foods, such as peppers, chili powder, curry powder, vinegar, hot sauces, and BBQ sauce. ? Citrus fruit juices and citrus fruits, such as oranges, lemons, and limes. ? Tomato-based foods, such as red sauce, chili, salsa, and pizza with red sauce. ? Fried and fatty foods, such as donuts, french fries, potato chips, and high-fat dressings. ? High-fat meats, such as hot dogs, rib eye steak, sausage, ham, and bacon. ? High-fat dairy items, such as whole milk, butter, and cream cheese.  Eat small meals often. Avoid eating large meals.  Avoid drinking large amounts of liquid with your meals.  Avoid eating meals during the 2-3  hours before bedtime.  Avoid lying down right after you eat.  Do not exercise right after you eat. General instructions  Pay attention to any changes in your symptoms.  Take over-the-counter and prescription medicines only as told by your doctor. Do not take aspirin, ibuprofen, or other NSAIDs unless your doctor says it is okay.  Do not use any tobacco products, including  cigarettes, chewing tobacco, and e-cigarettes. If you need help quitting, ask your doctor.  Wear loose clothes. Do not wear anything tight around your waist.  Raise (elevate) the head of your bed about 6 inches (15 cm).  Try to lower your stress. If you need help doing this, ask your doctor.  If you are overweight, lose an amount of weight that is healthy for you. Ask your doctor about a safe weight loss goal.  Keep all follow-up visits as told by your doctor. This is important. Contact a doctor if:  You have new symptoms.  You lose weight and you do not know why it is happening.  You have trouble swallowing, or it hurts to swallow.  You have wheezing or a cough that keeps happening.  Your symptoms do not get better with treatment.  You have a hoarse voice. Get help right away if:  You have pain in your arms, neck, jaw, teeth, or back.  You feel sweaty, dizzy, or light-headed.  You have chest pain or shortness of breath.  You throw up (vomit) and your throw up looks like blood or coffee grounds.  You pass out (faint).  Your poop (stool) is bloody or black.  You cannot swallow, drink, or eat. This information is not intended to replace advice given to you by your health care provider. Make sure you discuss any questions you have with your health care provider. Document Released: 03/24/2008 Document Revised: 03/13/2016 Document Reviewed: 01/31/2015 Elsevier Interactive Patient Education  [employer].

## 2018-02-08 NOTE — Anesthesia Preprocedure Evaluation (Signed)
Anesthesia Evaluation  Patient identified by MRN, date of birth, ID band Patient awake    Reviewed: Allergy & Precautions, H&P , NPO status , Patient's Chart, lab work & pertinent test results, reviewed documented beta blocker date and time   Airway Mallampati: II  TM Distance: >3 FB Neck ROM: full    Dental no notable dental hx.    Pulmonary neg pulmonary ROS, asthma , sleep apnea , COPD,  oxygen dependent, former smoker,  Refuses CPAP   Pulmonary exam normal breath sounds clear to auscultation       Cardiovascular Exercise Tolerance: Good + CAD, + Peripheral Vascular Disease and +CHF  negative cardio ROS  Atrial Fibrillation  Rhythm:regular Rate:Normal  S/p AVR   Neuro/Psych Anxiety negative neurological ROS  negative psych ROS   GI/Hepatic negative GI ROS, Neg liver ROS, GERD  ,  Endo/Other  negative endocrine ROS  Renal/GU negative Renal ROS  negative genitourinary   Musculoskeletal   Abdominal   Peds  Hematology negative hematology ROS (+) anemia ,   Anesthesia Other Findings   Reproductive/Obstetrics negative OB ROS                             Anesthesia Physical Anesthesia Plan  ASA: IV  Anesthesia Plan: General   Post-op Pain Management:    Induction:   PONV Risk Score and Plan: Propofol infusion  Airway Management Planned:   Additional Equipment:   Intra-op Plan:   Post-operative Plan:   Informed Consent: I have reviewed the patients History and Physical, chart, labs and discussed the procedure including the risks, benefits and alternatives for the proposed anesthesia with the patient or authorized representative who has indicated his/her understanding and acceptance.   Dental Advisory Given  Plan Discussed with: CRNA  Anesthesia Plan Comments:         Anesthesia Quick Evaluation

## 2018-02-08 NOTE — Anesthesia Postprocedure Evaluation (Signed)
Anesthesia Post Note  Patient: Meryl Crutch  Procedure(s) Performed: ESOPHAGOGASTRODUODENOSCOPY (EGD) WITH PROPOFOL (N/A ) MALONEY DILATION (N/A )  Patient location during evaluation: PACU Anesthesia Type: General Level of consciousness: awake and alert, oriented and patient cooperative Pain management: pain level controlled Vital Signs Assessment: post-procedure vital signs reviewed and stable Respiratory status: respiratory function stable and patient connected to nasal cannula oxygen (At baseline, on home O2) Cardiovascular status: stable Postop Assessment: no apparent nausea or vomiting Anesthetic complications: no     Last Vitals:  Vitals:   02/08/18 0725 02/08/18 0730  BP: 132/79 133/81  Pulse:    Resp: (!) 22 20  Temp:    SpO2: 92% 96%    Last Pain:  Vitals:   02/08/18 0718  TempSrc: Oral  PainSc: 0-No pain                 ADAMS, AMY A

## 2018-02-08 NOTE — Interval H&P Note (Signed)
History and Physical Interval Note:  02/08/2018 8:31 AM  [name]  has presented today for surgery, with the diagnosis of dysphagia, anemia, gerd  The various methods of treatment have been discussed with the patient and family. After consideration of risks, benefits and other options for treatment, the patient has consented to  Procedure(s) with comments: ESOPHAGOGASTRODUODENOSCOPY (EGD) WITH PROPOFOL (N/A) - 8:15am MALONEY DILATION (N/A) as a surgical intervention .  The patient's history has been reviewed, patient examined, no change in status, stable for surgery.  I have reviewed the patient's chart and labs.  Questions were answered to the patient's satisfaction.     [name]   No change. No Coumadin in 4 days last Lovenox yesterday morning.  Plan for EGD with ED  today per plan.  The risks, benefits, limitations, alternatives and imponderables have been reviewed with the patient. Potential for esophageal dilation, biopsy, etc. have also been reviewed.  Questions have been answered. All parties agreeable.

## 2018-02-09 ENCOUNTER — Telehealth: Payer: Self-pay | Admitting: Cardiology

## 2018-02-09 NOTE — Telephone Encounter (Signed)
Called asking to see if Dr Domenic Polite could help her try to figure out why she conitinues to cough.  Stated that she has contacted her PCP but they will only keep prescribing cough medicine that is not helping her.

## 2018-02-09 NOTE — Telephone Encounter (Signed)
Called pt. No answer. Left message for pt to return call.  

## 2018-02-10 NOTE — Telephone Encounter (Signed)
LMTCB

## 2018-02-11 ENCOUNTER — Ambulatory Visit (INDEPENDENT_AMBULATORY_CARE_PROVIDER_SITE_OTHER): Payer: Medicare HMO | Admitting: *Deleted

## 2018-02-11 DIAGNOSIS — I4891 Unspecified atrial fibrillation: Secondary | ICD-10-CM | POA: Diagnosis not present

## 2018-02-11 DIAGNOSIS — Z7901 Long term (current) use of anticoagulants: Secondary | ICD-10-CM

## 2018-02-11 DIAGNOSIS — Z952 Presence of prosthetic heart valve: Secondary | ICD-10-CM

## 2018-02-11 DIAGNOSIS — Z5181 Encounter for therapeutic drug level monitoring: Secondary | ICD-10-CM

## 2018-02-11 LAB — POCT INR: INR: 1.5

## 2018-02-11 NOTE — Patient Instructions (Addendum)
Restart coumadin 1 tablet daily except 1 1/2 tablets on Mondays, Wednesdays and Fridays Continue lovenox twice daily till gone Recheck on 02/16/18

## 2018-02-11 NOTE — Addendum Note (Signed)
Addendum  created 02/11/18 0849 by Vista Deck, CRNA   Intraprocedure Staff edited

## 2018-02-12 ENCOUNTER — Encounter (HOSPITAL_COMMUNITY): Payer: Self-pay | Admitting: Internal Medicine

## 2018-02-12 NOTE — Telephone Encounter (Signed)
Left multiple message to return call.

## 2018-02-16 ENCOUNTER — Ambulatory Visit (INDEPENDENT_AMBULATORY_CARE_PROVIDER_SITE_OTHER): Payer: Medicare HMO | Admitting: *Deleted

## 2018-02-16 DIAGNOSIS — Z5181 Encounter for therapeutic drug level monitoring: Secondary | ICD-10-CM | POA: Diagnosis not present

## 2018-02-16 DIAGNOSIS — Z952 Presence of prosthetic heart valve: Secondary | ICD-10-CM

## 2018-02-16 DIAGNOSIS — J441 Chronic obstructive pulmonary disease with (acute) exacerbation: Secondary | ICD-10-CM | POA: Diagnosis not present

## 2018-02-16 DIAGNOSIS — I4891 Unspecified atrial fibrillation: Secondary | ICD-10-CM | POA: Diagnosis not present

## 2018-02-16 DIAGNOSIS — Z7901 Long term (current) use of anticoagulants: Secondary | ICD-10-CM | POA: Diagnosis not present

## 2018-02-16 DIAGNOSIS — I5033 Acute on chronic diastolic (congestive) heart failure: Secondary | ICD-10-CM | POA: Diagnosis not present

## 2018-02-16 LAB — POCT INR: INR: 1.9

## 2018-02-16 NOTE — Patient Instructions (Signed)
Take coumadin 2 tablets tonight then resume 1 tablet daily except 1 1/2 tablets on Mondays, Wednesdays and Fridays Recheck on 03/02/18

## 2018-02-25 DIAGNOSIS — Z952 Presence of prosthetic heart valve: Secondary | ICD-10-CM | POA: Diagnosis not present

## 2018-02-25 DIAGNOSIS — I10 Essential (primary) hypertension: Secondary | ICD-10-CM | POA: Diagnosis not present

## 2018-02-25 DIAGNOSIS — J45909 Unspecified asthma, uncomplicated: Secondary | ICD-10-CM | POA: Diagnosis not present

## 2018-02-25 DIAGNOSIS — J9611 Chronic respiratory failure with hypoxia: Secondary | ICD-10-CM | POA: Diagnosis not present

## 2018-03-02 DIAGNOSIS — R0981 Nasal congestion: Secondary | ICD-10-CM | POA: Diagnosis not present

## 2018-03-02 DIAGNOSIS — J45909 Unspecified asthma, uncomplicated: Secondary | ICD-10-CM | POA: Diagnosis not present

## 2018-03-02 DIAGNOSIS — I10 Essential (primary) hypertension: Secondary | ICD-10-CM | POA: Diagnosis not present

## 2018-03-02 DIAGNOSIS — E782 Mixed hyperlipidemia: Secondary | ICD-10-CM | POA: Diagnosis not present

## 2018-03-02 DIAGNOSIS — I38 Endocarditis, valve unspecified: Secondary | ICD-10-CM | POA: Diagnosis not present

## 2018-03-02 DIAGNOSIS — G894 Chronic pain syndrome: Secondary | ICD-10-CM | POA: Diagnosis not present

## 2018-03-02 DIAGNOSIS — G2581 Restless legs syndrome: Secondary | ICD-10-CM | POA: Diagnosis not present

## 2018-03-02 DIAGNOSIS — M9909 Segmental and somatic dysfunction of abdomen and other regions: Secondary | ICD-10-CM | POA: Diagnosis not present

## 2018-03-02 DIAGNOSIS — R69 Illness, unspecified: Secondary | ICD-10-CM | POA: Diagnosis not present

## 2018-03-02 DIAGNOSIS — E039 Hypothyroidism, unspecified: Secondary | ICD-10-CM | POA: Diagnosis not present

## 2018-03-02 DIAGNOSIS — K219 Gastro-esophageal reflux disease without esophagitis: Secondary | ICD-10-CM | POA: Diagnosis not present

## 2018-03-02 DIAGNOSIS — I5042 Chronic combined systolic (congestive) and diastolic (congestive) heart failure: Secondary | ICD-10-CM | POA: Diagnosis not present

## 2018-03-04 ENCOUNTER — Telehealth: Payer: Self-pay | Admitting: *Deleted

## 2018-03-04 NOTE — Telephone Encounter (Signed)
Pt started Prednisone 10mg  tabs ( 4 tabs x 3 days, 3 tabs x 3 days, 2 tabs x 3 days, 1 tab x 3 days), Doxycycline 100mg  2 x day x 15 days and hydromet cough syrup on 12/26/17 by Dr Luan Pulling fro chest congestion.  Told pt to hold coumadin tonight and tomorrow night then take 1 tablet (2mg  ) until INR check on 5/21.  She verbalized understanding.

## 2018-03-04 NOTE — Telephone Encounter (Signed)
Patient called stating that she saw Dr. Luan Pulling . He put her on 3 medications and was told to contact Edrick Oh, RN in regards to medications and coumdin.

## 2018-03-09 ENCOUNTER — Ambulatory Visit (INDEPENDENT_AMBULATORY_CARE_PROVIDER_SITE_OTHER): Payer: Medicare HMO | Admitting: *Deleted

## 2018-03-09 DIAGNOSIS — Z952 Presence of prosthetic heart valve: Secondary | ICD-10-CM | POA: Diagnosis not present

## 2018-03-09 DIAGNOSIS — I4891 Unspecified atrial fibrillation: Secondary | ICD-10-CM | POA: Diagnosis not present

## 2018-03-09 DIAGNOSIS — Z5181 Encounter for therapeutic drug level monitoring: Secondary | ICD-10-CM

## 2018-03-09 DIAGNOSIS — Z7901 Long term (current) use of anticoagulants: Secondary | ICD-10-CM | POA: Diagnosis not present

## 2018-03-09 LAB — POCT INR: INR: 1.5 — AB (ref 2.0–3.0)

## 2018-03-09 NOTE — Patient Instructions (Signed)
Take coumadin 2 tablets tonight and tomorrow night then resume 1 tablet daily except 1 1/2 tablets on Mondays, Wednesdays and Fridays Recheck in 2 weeks

## 2018-03-11 DIAGNOSIS — J45909 Unspecified asthma, uncomplicated: Secondary | ICD-10-CM | POA: Diagnosis not present

## 2018-03-11 DIAGNOSIS — Z952 Presence of prosthetic heart valve: Secondary | ICD-10-CM | POA: Diagnosis not present

## 2018-03-11 DIAGNOSIS — J301 Allergic rhinitis due to pollen: Secondary | ICD-10-CM | POA: Diagnosis not present

## 2018-03-11 DIAGNOSIS — I10 Essential (primary) hypertension: Secondary | ICD-10-CM | POA: Diagnosis not present

## 2018-03-19 DIAGNOSIS — I5033 Acute on chronic diastolic (congestive) heart failure: Secondary | ICD-10-CM | POA: Diagnosis not present

## 2018-03-19 DIAGNOSIS — J441 Chronic obstructive pulmonary disease with (acute) exacerbation: Secondary | ICD-10-CM | POA: Diagnosis not present

## 2018-03-19 DIAGNOSIS — Z7901 Long term (current) use of anticoagulants: Secondary | ICD-10-CM | POA: Diagnosis not present

## 2018-03-23 ENCOUNTER — Encounter: Payer: Self-pay | Admitting: *Deleted

## 2018-03-23 ENCOUNTER — Ambulatory Visit (INDEPENDENT_AMBULATORY_CARE_PROVIDER_SITE_OTHER): Payer: Medicare HMO | Admitting: *Deleted

## 2018-03-23 ENCOUNTER — Other Ambulatory Visit: Payer: Self-pay | Admitting: *Deleted

## 2018-03-23 DIAGNOSIS — I4891 Unspecified atrial fibrillation: Secondary | ICD-10-CM | POA: Diagnosis not present

## 2018-03-23 DIAGNOSIS — Z5181 Encounter for therapeutic drug level monitoring: Secondary | ICD-10-CM

## 2018-03-23 DIAGNOSIS — D649 Anemia, unspecified: Secondary | ICD-10-CM

## 2018-03-23 DIAGNOSIS — Z952 Presence of prosthetic heart valve: Secondary | ICD-10-CM

## 2018-03-23 DIAGNOSIS — Z7901 Long term (current) use of anticoagulants: Secondary | ICD-10-CM

## 2018-03-23 LAB — POCT INR: INR: 1.9 — AB (ref 2.0–3.0)

## 2018-03-23 NOTE — Patient Instructions (Signed)
Increase coumadin to 1 1/2 tablets daily except 1 tablet on Sundays and Thursdays Recheck in 2 weeks

## 2018-03-24 ENCOUNTER — Inpatient Hospital Stay (HOSPITAL_COMMUNITY)
Admission: EM | Admit: 2018-03-24 | Discharge: 2018-03-31 | DRG: 291 | Disposition: A | Discharge status: Home Health | Payer: Medicare HMO | Attending: Internal Medicine | Admitting: Internal Medicine

## 2018-03-24 ENCOUNTER — Encounter (HOSPITAL_COMMUNITY): Payer: Self-pay | Admitting: Emergency Medicine

## 2018-03-24 ENCOUNTER — Emergency Department (HOSPITAL_COMMUNITY): Payer: Medicare HMO

## 2018-03-24 ENCOUNTER — Other Ambulatory Visit: Payer: Self-pay

## 2018-03-24 DIAGNOSIS — N183 Chronic kidney disease, stage 3 (moderate): Secondary | ICD-10-CM | POA: Diagnosis present

## 2018-03-24 DIAGNOSIS — E875 Hyperkalemia: Secondary | ICD-10-CM | POA: Diagnosis present

## 2018-03-24 DIAGNOSIS — J9602 Acute respiratory failure with hypercapnia: Secondary | ICD-10-CM | POA: Diagnosis not present

## 2018-03-24 DIAGNOSIS — I25709 Atherosclerosis of coronary artery bypass graft(s), unspecified, with unspecified angina pectoris: Secondary | ICD-10-CM | POA: Diagnosis present

## 2018-03-24 DIAGNOSIS — D631 Anemia in chronic kidney disease: Secondary | ICD-10-CM | POA: Diagnosis present

## 2018-03-24 DIAGNOSIS — J9622 Acute and chronic respiratory failure with hypercapnia: Secondary | ICD-10-CM | POA: Diagnosis present

## 2018-03-24 DIAGNOSIS — R0600 Dyspnea, unspecified: Secondary | ICD-10-CM

## 2018-03-24 DIAGNOSIS — R402413 Glasgow coma scale score 13-15, at hospital admission: Secondary | ICD-10-CM | POA: Diagnosis present

## 2018-03-24 DIAGNOSIS — I248 Other forms of acute ischemic heart disease: Secondary | ICD-10-CM | POA: Diagnosis not present

## 2018-03-24 DIAGNOSIS — R5381 Other malaise: Secondary | ICD-10-CM | POA: Diagnosis not present

## 2018-03-24 DIAGNOSIS — J9690 Respiratory failure, unspecified, unspecified whether with hypoxia or hypercapnia: Secondary | ICD-10-CM

## 2018-03-24 DIAGNOSIS — J441 Chronic obstructive pulmonary disease with (acute) exacerbation: Secondary | ICD-10-CM | POA: Diagnosis present

## 2018-03-24 DIAGNOSIS — I5033 Acute on chronic diastolic (congestive) heart failure: Secondary | ICD-10-CM | POA: Diagnosis not present

## 2018-03-24 DIAGNOSIS — Z87891 Personal history of nicotine dependence: Secondary | ICD-10-CM

## 2018-03-24 DIAGNOSIS — N170 Acute kidney failure with tubular necrosis: Secondary | ICD-10-CM | POA: Diagnosis not present

## 2018-03-24 DIAGNOSIS — Z952 Presence of prosthetic heart valve: Secondary | ICD-10-CM | POA: Diagnosis not present

## 2018-03-24 DIAGNOSIS — E876 Hypokalemia: Secondary | ICD-10-CM | POA: Diagnosis not present

## 2018-03-24 DIAGNOSIS — R0602 Shortness of breath: Secondary | ICD-10-CM | POA: Diagnosis not present

## 2018-03-24 DIAGNOSIS — Z7901 Long term (current) use of anticoagulants: Secondary | ICD-10-CM

## 2018-03-24 DIAGNOSIS — E871 Hypo-osmolality and hyponatremia: Secondary | ICD-10-CM | POA: Diagnosis not present

## 2018-03-24 DIAGNOSIS — E785 Hyperlipidemia, unspecified: Secondary | ICD-10-CM | POA: Diagnosis not present

## 2018-03-24 DIAGNOSIS — I25708 Atherosclerosis of coronary artery bypass graft(s), unspecified, with other forms of angina pectoris: Secondary | ICD-10-CM | POA: Diagnosis not present

## 2018-03-24 DIAGNOSIS — I350 Nonrheumatic aortic (valve) stenosis: Secondary | ICD-10-CM | POA: Diagnosis present

## 2018-03-24 DIAGNOSIS — I4891 Unspecified atrial fibrillation: Secondary | ICD-10-CM | POA: Diagnosis not present

## 2018-03-24 DIAGNOSIS — Z8711 Personal history of peptic ulcer disease: Secondary | ICD-10-CM

## 2018-03-24 DIAGNOSIS — G4730 Sleep apnea, unspecified: Secondary | ICD-10-CM | POA: Diagnosis present

## 2018-03-24 DIAGNOSIS — G4733 Obstructive sleep apnea (adult) (pediatric): Secondary | ICD-10-CM | POA: Diagnosis not present

## 2018-03-24 DIAGNOSIS — F419 Anxiety disorder, unspecified: Secondary | ICD-10-CM | POA: Diagnosis present

## 2018-03-24 DIAGNOSIS — Z79899 Other long term (current) drug therapy: Secondary | ICD-10-CM

## 2018-03-24 DIAGNOSIS — Z9119 Patient's noncompliance with other medical treatment and regimen: Secondary | ICD-10-CM

## 2018-03-24 DIAGNOSIS — Z9981 Dependence on supplemental oxygen: Secondary | ICD-10-CM

## 2018-03-24 DIAGNOSIS — D649 Anemia, unspecified: Secondary | ICD-10-CM | POA: Diagnosis present

## 2018-03-24 DIAGNOSIS — J9621 Acute and chronic respiratory failure with hypoxia: Secondary | ICD-10-CM | POA: Diagnosis not present

## 2018-03-24 DIAGNOSIS — J9601 Acute respiratory failure with hypoxia: Secondary | ICD-10-CM | POA: Diagnosis not present

## 2018-03-24 DIAGNOSIS — N19 Unspecified kidney failure: Secondary | ICD-10-CM

## 2018-03-24 DIAGNOSIS — R748 Abnormal levels of other serum enzymes: Secondary | ICD-10-CM | POA: Diagnosis not present

## 2018-03-24 DIAGNOSIS — R7989 Other specified abnormal findings of blood chemistry: Secondary | ICD-10-CM | POA: Diagnosis present

## 2018-03-24 DIAGNOSIS — Z8719 Personal history of other diseases of the digestive system: Secondary | ICD-10-CM

## 2018-03-24 DIAGNOSIS — N179 Acute kidney failure, unspecified: Secondary | ICD-10-CM

## 2018-03-24 DIAGNOSIS — I482 Chronic atrial fibrillation: Secondary | ICD-10-CM | POA: Diagnosis present

## 2018-03-24 DIAGNOSIS — R042 Hemoptysis: Secondary | ICD-10-CM | POA: Diagnosis not present

## 2018-03-24 DIAGNOSIS — I130 Hypertensive heart and chronic kidney disease with heart failure and stage 1 through stage 4 chronic kidney disease, or unspecified chronic kidney disease: Secondary | ICD-10-CM | POA: Diagnosis not present

## 2018-03-24 DIAGNOSIS — K219 Gastro-esophageal reflux disease without esophagitis: Secondary | ICD-10-CM | POA: Diagnosis present

## 2018-03-24 DIAGNOSIS — I25119 Atherosclerotic heart disease of native coronary artery with unspecified angina pectoris: Secondary | ICD-10-CM | POA: Diagnosis not present

## 2018-03-24 DIAGNOSIS — Z9989 Dependence on other enabling machines and devices: Secondary | ICD-10-CM

## 2018-03-24 DIAGNOSIS — Z951 Presence of aortocoronary bypass graft: Secondary | ICD-10-CM

## 2018-03-24 DIAGNOSIS — R791 Abnormal coagulation profile: Secondary | ICD-10-CM | POA: Diagnosis present

## 2018-03-24 DIAGNOSIS — I2510 Atherosclerotic heart disease of native coronary artery without angina pectoris: Secondary | ICD-10-CM | POA: Diagnosis present

## 2018-03-24 DIAGNOSIS — R778 Other specified abnormalities of plasma proteins: Secondary | ICD-10-CM | POA: Diagnosis present

## 2018-03-24 DIAGNOSIS — G2581 Restless legs syndrome: Secondary | ICD-10-CM | POA: Diagnosis present

## 2018-03-24 DIAGNOSIS — I509 Heart failure, unspecified: Secondary | ICD-10-CM | POA: Diagnosis not present

## 2018-03-24 DIAGNOSIS — I340 Nonrheumatic mitral (valve) insufficiency: Secondary | ICD-10-CM | POA: Diagnosis not present

## 2018-03-24 LAB — BLOOD GAS, ARTERIAL
Acid-Base Excess: 7.6 mmol/L — ABNORMAL HIGH (ref 0.0–2.0)
Bicarbonate: 30.6 mmol/L — ABNORMAL HIGH (ref 20.0–28.0)
Delivery systems: POSITIVE
Drawn by: 270161
Expiratory PAP: 5
FIO2: 60
INSPIRATORY PAP: 17
LHR: 10 {breaths}/min
O2 Saturation: 99.1 %
PATIENT TEMPERATURE: 37
PO2 ART: 164 mmHg — AB (ref 83.0–108.0)
pCO2 arterial: 60.6 mmHg — ABNORMAL HIGH (ref 32.0–48.0)
pH, Arterial: 7.357 (ref 7.350–7.450)

## 2018-03-24 LAB — CBC WITH DIFFERENTIAL/PLATELET
Basophils Absolute: 0 10*3/uL (ref 0.0–0.1)
Basophils Relative: 0 %
Eosinophils Absolute: 0.1 10*3/uL (ref 0.0–0.7)
Eosinophils Relative: 1 %
HEMATOCRIT: 32.7 % — AB (ref 36.0–46.0)
Hemoglobin: 9.7 g/dL — ABNORMAL LOW (ref 12.0–15.0)
LYMPHS PCT: 10 %
Lymphs Abs: 1 10*3/uL (ref 0.7–4.0)
MCH: 27.6 pg (ref 26.0–34.0)
MCHC: 29.7 g/dL — AB (ref 30.0–36.0)
MCV: 93.2 fL (ref 78.0–100.0)
MONO ABS: 0.9 10*3/uL (ref 0.1–1.0)
MONOS PCT: 10 %
NEUTROS ABS: 7.2 10*3/uL (ref 1.7–7.7)
Neutrophils Relative %: 79 %
Platelets: 220 10*3/uL (ref 150–400)
RBC: 3.51 MIL/uL — ABNORMAL LOW (ref 3.87–5.11)
RDW: 15.5 % (ref 11.5–15.5)
WBC: 9.1 10*3/uL (ref 4.0–10.5)

## 2018-03-24 LAB — BASIC METABOLIC PANEL
Anion gap: 9 (ref 5–15)
BUN: 11 mg/dL (ref 6–20)
CALCIUM: 8.9 mg/dL (ref 8.9–10.3)
CO2: 33 mmol/L — AB (ref 22–32)
CREATININE: 1.11 mg/dL — AB (ref 0.44–1.00)
Chloride: 99 mmol/L — ABNORMAL LOW (ref 101–111)
GFR calc Af Amer: 59 mL/min — ABNORMAL LOW (ref >60)
GFR calc non Af Amer: 51 mL/min — ABNORMAL LOW (ref >60)
Glucose, Bld: 113 mg/dL — ABNORMAL HIGH (ref 65–99)
Potassium: 3.2 mmol/L — ABNORMAL LOW (ref 3.5–5.1)
Sodium: 141 mmol/L (ref 135–145)

## 2018-03-24 LAB — TROPONIN I: TROPONIN I: 0.03 ng/mL — AB (ref <0.03)

## 2018-03-24 LAB — BRAIN NATRIURETIC PEPTIDE: B NATRIURETIC PEPTIDE 5: 336 pg/mL — AB (ref 0.0–100.0)

## 2018-03-24 LAB — PHOSPHORUS: Phosphorus: 2.4 mg/dL — ABNORMAL LOW (ref 2.5–4.6)

## 2018-03-24 LAB — MAGNESIUM: MAGNESIUM: 1.6 mg/dL — AB (ref 1.7–2.4)

## 2018-03-24 MED ORDER — LORAZEPAM 2 MG/ML IJ SOLN
1.0000 mg | Freq: Once | INTRAMUSCULAR | Status: AC
  Administered 2018-03-24: 1 mg via INTRAVENOUS
  Filled 2018-03-24: qty 1

## 2018-03-24 MED ORDER — DILTIAZEM HCL-DEXTROSE 100-5 MG/100ML-% IV SOLN (PREMIX)
5.0000 mg/h | Freq: Once | INTRAVENOUS | Status: AC
  Administered 2018-03-24: 5 mg/h via INTRAVENOUS
  Filled 2018-03-24: qty 100

## 2018-03-24 MED ORDER — LEVALBUTEROL HCL 1.25 MG/0.5ML IN NEBU: 1.2500 mg | INHALATION_SOLUTION | Freq: Four times a day (QID) | RESPIRATORY_TRACT | Status: DC | PRN

## 2018-03-24 MED ORDER — MAGNESIUM SULFATE 4 GM/100ML IV SOLN
4.0000 g | Freq: Once | INTRAVENOUS | Status: AC
  Administered 2018-03-24: 4 g via INTRAVENOUS
  Filled 2018-03-24: qty 100

## 2018-03-24 MED ORDER — WARFARIN SODIUM 2 MG PO TABS
2.0000 mg | ORAL_TABLET | Freq: Once | ORAL | Status: AC
  Administered 2018-03-24: 2 mg via ORAL
  Filled 2018-03-24 (×2): qty 1

## 2018-03-24 MED ORDER — FUROSEMIDE 10 MG/ML IJ SOLN
80.0000 mg | Freq: Once | INTRAMUSCULAR | Status: AC
  Administered 2018-03-24: 80 mg via INTRAVENOUS
  Filled 2018-03-24: qty 8

## 2018-03-24 MED ORDER — WARFARIN - PHARMACIST DOSING INPATIENT
Freq: Every day | Status: DC
  Administered 2018-03-25 – 2018-03-26 (×2)

## 2018-03-24 MED ORDER — POTASSIUM CHLORIDE CRYS ER 20 MEQ PO TBCR
40.0000 meq | EXTENDED_RELEASE_TABLET | Freq: Once | ORAL | Status: AC
  Administered 2018-03-24: 40 meq via ORAL
  Filled 2018-03-24: qty 2

## 2018-03-24 MED ORDER — FUROSEMIDE 10 MG/ML IJ SOLN: 80.0000 mg | Freq: Once | INTRAMUSCULAR | Status: DC

## 2018-03-24 MED ORDER — WARFARIN SODIUM 3 MG PO TABS
3.0000 mg | ORAL_TABLET | Freq: Once | ORAL | Status: DC
  Filled 2018-03-24: qty 1

## 2018-03-24 MED ORDER — IPRATROPIUM BROMIDE 0.02 % IN SOLN: 0.5000 mg | Freq: Four times a day (QID) | RESPIRATORY_TRACT | Status: DC | PRN

## 2018-03-24 MED ORDER — FUROSEMIDE 10 MG/ML IJ SOLN
INTRAMUSCULAR | Status: AC
  Filled 2018-03-24: qty 4

## 2018-03-24 MED ORDER — MORPHINE SULFATE (PF) 2 MG/ML IV SOLN
2.0000 mg | INTRAVENOUS | Status: DC | PRN
  Administered 2018-03-24: 2 mg via INTRAVENOUS
  Filled 2018-03-24: qty 1

## 2018-03-24 NOTE — ED Provider Notes (Addendum)
[facility] EMERGENCY DEPARTMENT Provider Note   CSN: [id number] Arrival date & time: 03/24/18  1726     History   Chief Complaint Chief Complaint  Patient presents with  . Shortness of Breath  . Hemoptysis    HPI [name] is a 67 y.o. female.  Patient with known history of chronic diastolic congestive heart failure.  Has had several admissions for acute on chronic heart failure.  Patient also has a known history of atrial fibrillation.  Is on Lovenox for that.  Patient presents with shortness of breath at least since yesterday but she told me probably more days and that just got worse kind yesterday.  Patient supposed to be on 2 L of oxygen at home and is supposed to use CPAP.  But she is not very compliant.  Patient also states that she is been kind of coughing up blood is more of a pink frothy type stuff.  Patient states her legs have been having increased swelling.  Denies any chest pain.  Patient arrived not on oxygen.  Brought in by POV and family members patient's oxygen saturations were around 87% on room air.     Past Medical History:  Diagnosis Date  . Acute on chronic diastolic (congestive) heart failure ([place])   . Anxiety disorder   . Aortic stenosis    Status post St. Jude mechanical AVR 2007  . Asthma   . Atrial fibrillation ([place])   . Carcinoid tumor of colon 2007  . Coronary atherosclerosis of native coronary artery    Status post CABG 2007  . GERD (gastroesophageal reflux disease)   . History of colonoscopy 2003   Dr. Gala Romney - normal  . Hyperlipidemia   . Macromastia   . Pedal edema    Bilateral, chronic  . Peptic stricture of esophagus 10/04/2010   GE junction on last EGD by Dr. Gala Romney, benign biopsies  . RLS (restless legs syndrome)   . Schatzki's ring     Patient Active Problem List   Diagnosis Date Noted  . Coughing 01/14/2018  . Anemia 01/14/2018  . Hx of adenomatous colonic polyps 01/14/2018  . Acute on chronic diastolic CHF (congestive  heart failure) ([place]) 12/17/2016  . COPD exacerbation ([place]) 12/17/2016  . Bilateral leg edema 07/05/2014  . Encounter for therapeutic drug monitoring 11/15/2013  . Chronic venous insufficiency 08/26/2011  . Long term current use of anticoagulant therapy 01/11/2011  . GERD 09/16/2010  . DYSPHAGIA 09/16/2010  . Aortic valve replaced 06/05/2009  . HYPERLIPIDEMIA-MIXED 06/04/2009  . Coronary atherosclerosis of native coronary artery 06/04/2009  . Atrial fibrillation ([place]) 06/04/2009    Past Surgical History:  Procedure Laterality Date  . ABDOMINAL HYSTERECTOMY    . AORTIC VALVE REPLACEMENT  2007   #25 mm St. 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 APPENDECTOMY  2007  . BACK SURGERY     lumbar 4 and 5   . BIOPSY  02/05/2012   RMR:Two tongues of salmon-colored epithelium distal esophagus, very short-segment Barrett's s/p bx/Small hiatal hernia, otherwise normal stomach, D1, D2. Status post esophageal dilation. Biopsy showed GERD.  . Breast cyst removed     bilateral  . BREAST REDUCTION SURGERY    . CESAREAN SECTION    . 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 COLONOSCOPY  02/05/2012   WUJ:WJXBJY rectum, sigmoid diverticulosis,descending colon polyp , tubular adenoma  . CORONARY ARTERY BYPASS GRAFT     06/2006 -  RIMA to RCA, SVG to RCA  . ESOPHAGOGASTRODUODENOSCOPY  10/03/2010   Dr. Gala Romney- schatzki's ring, shoft peptic stricture at GE junction.  . ESOPHAGOGASTRODUODENOSCOPY (EGD) WITH PROPOFOL N/A 02/08/2018   Procedure: ESOPHAGOGASTRODUODENOSCOPY (EGD) WITH PROPOFOL;  Surgeon: Daneil Dolin, MD;  Location: [facility] ENDO SUITE;  Service: Endoscopy;  Laterality: N/A;  8:15am  . FOOT SURGERY     bilateral bunionectomy  . HERNIA REPAIR     with mesh  . LAPAROTOMY  2007   small bowel resection secondary to small bowel obstruction  . MALONEY DILATION  02/05/2012   Procedure: Venia Minks DILATION;  Surgeon: Daneil Dolin, MD;   Location: [facility] ORS;  Service: Endoscopy;  Laterality: N/A;  42mm   . MALONEY DILATION N/A 02/08/2018   Procedure: Venia Minks DILATION;  Surgeon: Daneil Dolin, MD;  Location: [facility] ENDO SUITE;  Service: Endoscopy;  Laterality: N/A;  . Teeth removal       OB History   None      Home Medications    Prior to Admission medications   Medication Sig Start Date End Date Taking? Authorizing Provider  albuterol (PROVENTIL HFA;VENTOLIN HFA) 108 (90 BASE) MCG/ACT inhaler Inhale 1-2 puffs into the lungs every 6 (six) hours as needed for wheezing or shortness of breath.     [provider]  allopurinol (ZYLOPRIM) 100 MG tablet Take 100 mg by mouth daily.    [provider]  ALPRAZolam Duanne Moron) 1 MG tablet Take 1 mg by mouth 3 (three) times daily as needed for anxiety ((scheduled in the morning)).  09/20/16   [provider]  benzonatate (TESSALON) 200 MG capsule Take 1 capsule (200 mg total) by mouth 2 (two) times daily as needed for cough. Patient taking differently: Take 200 mg by mouth 2 (two) times daily.  03/19/17   Mariel Aloe, MD  calcium carbonate (OS-CAL) 600 MG TABS tablet Take 600 mg by mouth 2 (two) times daily.    [provider]  cetirizine (ZYRTEC) 10 MG tablet Take 10 mg by mouth daily.      [provider]  clobetasol cream (TEMOVATE) 0.93 % Apply 1 application topically 2 (two) times daily as needed (FOR SKIN IRRITATION/REDNESS & BLISTERING ON LEGS).  02/26/17   [provider]  CRESTOR 10 MG tablet Take 10 mg by mouth at bedtime.  12/02/13   [provider]  cyclobenzaprine (FLEXERIL) 10 MG tablet Take 10 mg by mouth 3 (three) times daily as needed for muscle spasms.     [provider]  enoxaparin (LOVENOX) 100 MG/ML injection Inject 1 mL (100 mg total) into the skin every 12 (twelve) hours. 02/04/18   Satira Sark, MD  furosemide (LASIX) 40 MG tablet Take 40 mg by mouth 2 (two) times daily. PATIENT MAY INCREASE TO  2 TABLETS (80 MG) BY MOUTH TWICE DAILY WITH INCREASE FLUID RETENTION.    [provider]  HYDROcodone-acetaminophen (NORCO) 10-325 MG per tablet Take 1 tablet by mouth every 12 (twelve) hours as needed (for pain. (scheduled at bedtime)).     [provider]  levothyroxine (SYNTHROID, LEVOTHROID) 50 MCG tablet Take 50 mcg by mouth daily before breakfast.     [provider]  magnesium oxide (MAG-OX) 400 MG tablet Take 400 mg by mouth at bedtime.    [provider]  metoprolol succinate (TOPROL-XL) 50 MG 24 hr tablet Take 1 tablet (50 mg total) by mouth daily. 12/20/16   Murlean Iba, MD  Multiple Vitamin (MULTIVITAMIN  WITH MINERALS) TABS tablet Take 1 tablet by mouth daily.    [provider]  OXYGEN Inhale into the lungs as needed.    [provider]  potassium chloride (K-DUR) 10 MEQ tablet Take 1 tablet (10 mEq total) by mouth daily. 04/03/17 01/29/19  Lendon Colonel, NP  rOPINIRole (REQUIP) 3 MG tablet Take 6 mg by mouth at bedtime.     [provider]  temazepam (RESTORIL) 15 MG capsule Take 15 mg by mouth at bedtime.     [provider]  warfarin (COUMADIN) 2 MG tablet Takes one tablet (2mg ) everyday Patient taking differently: Take 2-3 mg by mouth daily. TAKE 1.5 TABLETS (3 MG) BY MOUTH ON MONDAYS, WEDNESDAYS, FRIDAYS. TAKE 1 TABLET (2 MG) BY MOUTH ON SUNDAYS, [place], THURSDAYS, & SATURDAYS. 03/03/17   Arnoldo Lenis, MD    Family History Family History  Problem Relation Age of Onset  . Stroke Mother   . Cancer Father   . Anesthesia problems Neg Hx   . Hypotension Neg Hx   . Malignant hyperthermia Neg Hx   . Pseudochol deficiency Neg Hx     Social History Social History   Tobacco Use  . Smoking status: Former Smoker    Packs/day: 1.00    Years: 20.00    Pack years: 20.00    Types: Cigarettes    Last attempt to quit: 10/20/1988    Years since quitting: 29.4  . Smokeless tobacco: Never Used    Substance Use Topics  . Alcohol use: No    Alcohol/week: 0.0 oz  . Drug use: No     Allergies   Penicillins   Review of Systems Review of Systems  Constitutional: Negative for fever.  HENT: Negative for congestion and nosebleeds.   Eyes: Negative for visual disturbance.  Respiratory: Positive for cough and shortness of breath.   Cardiovascular: Positive for leg swelling. Negative for chest pain.  Gastrointestinal: Negative for abdominal pain, nausea and vomiting.  Genitourinary: Negative for dysuria.  Musculoskeletal: Negative for back pain.  Skin: Negative for rash.  Neurological: Negative for syncope.  Hematological: Bruises/bleeds easily.     Physical Exam Updated Vital Signs BP 113/79   Pulse (!) 124   Temp 99.9 F (37.7 C) (Oral)   Resp (!) 33   SpO2 91%   Physical Exam  Constitutional: She is oriented to person, place, and time. She appears well-developed and well-nourished. She appears distressed.  HENT:  Head: Normocephalic and atraumatic.  Mucous membranes dry.  Eyes: Pupils are equal, round, and reactive to light. Conjunctivae and EOM are normal.  Neck: Neck supple.  Cardiovascular:  Tachycardic irregular rate  Pulmonary/Chest: Breath sounds normal. She is in respiratory distress. She has no wheezes. She has no rales.  Decreased breath sounds bilaterally poor air movement.  Abdominal: Soft. Bowel sounds are normal.  Musculoskeletal: Normal range of motion. She exhibits edema.  Neurological: She is alert and oriented to person, place, and time. No cranial nerve deficit or sensory deficit. She exhibits normal muscle tone. Coordination normal.  Skin: Skin is warm.  Nursing note and vitals reviewed.    ED Treatments / Results  Labs (all labs ordered are listed, but only abnormal results are displayed) Labs Reviewed  CBC WITH DIFFERENTIAL/PLATELET - Abnormal; Notable for the following components:      Result Value   RBC 3.51 (*)    Hemoglobin 9.7  (*)    HCT 32.7 (*)    MCHC 29.7 (*)  All other components within normal limits  BLOOD GAS, ARTERIAL - Abnormal; Notable for the following components:   pCO2 arterial 60.6 (*)    pO2, Arterial 164 (*)    Bicarbonate 30.6 (*)    Acid-Base Excess 7.6 (*)    All other components within normal limits  BRAIN NATRIURETIC PEPTIDE - Abnormal; Notable for the following components:   B Natriuretic Peptide 336.0 (*)    All other components within normal limits  BASIC METABOLIC PANEL  TROPONIN I    EKG EKG Interpretation  Date/Time:  Wednesday March 24 2018 17:47:18 EDT Ventricular Rate:  116 PR Interval:    QRS Duration: 86 QT Interval:  293 QTC Calculation: 400 R Axis:   69 Text Interpretation:  Atrial fibrillation Nonspecific repol abnormality, diffuse leads Confirmed by Fredia Sorrow [phone]) on 03/24/2018 5:56:05 PM   Radiology Dg Chest Port 1 View  Result Date: 03/24/2018 CLINICAL DATA:  Mild to CIS, shortness of breath, distress EXAM: PORTABLE CHEST 1 VIEW COMPARISON:  11/04/2017 FINDINGS: Prior CABG and valve replacement. Cardiomegaly with vascular congestion and diffuse interstitial opacities throughout the lungs, likely interstitial edema. No effusions or acute bony abnormality. IMPRESSION: Mild interstitial edema/CHF. Electronically Signed   By: Rolm Baptise M.D.   On: 03/24/2018 18:19    Procedures Procedures (including critical care time)  CRITICAL CARE Performed by: Fredia Sorrow Total critical care time: 60 minutes Critical care time was exclusive of separately billable procedures and treating other patients. Critical care was necessary to treat or prevent imminent or life-threatening deterioration. Critical care was time spent personally by me on the following activities: development of treatment plan with patient and/or surrogate as well as nursing, discussions with consultants, evaluation of patient's response to treatment, examination of patient, obtaining history  from patient or surrogate, ordering and performing treatments and interventions, ordering and review of laboratory studies, ordering and review of radiographic studies, pulse oximetry and re-evaluation of patient's condition.   Medications Ordered in ED Medications  furosemide (LASIX) injection 80 mg (has no administration in time range)  LORazepam (ATIVAN) injection 1 mg (1 mg Intravenous Given 03/24/18 1820)     Initial Impression / Assessment and Plan / ED Course  I have reviewed the triage vital signs and the nursing notes.  Pertinent labs & imaging results that were available during my care of the patient were reviewed by me and considered in my medical decision making (see chart for details).    Patient presenting in respiratory distress.  Poor air movement.  However talking in sentences a bit winded on room air she is post to be on oxygen but she arrived on room air her sats were below 90%.  Patient coughing up kind of frothy pink sputum.  Chest x-ray showed only mild CHF but patient had market leg swelling.  And all symptoms indicate that this is probably significant CHF based on her past history.  Patient BNP slightly elevated.  Patient is normally on Lasix she was given some IV Lasix here 80 mg IV but prior to that patient was started on BiPAP.  Brought her oxygen up helped her breathing some.  Patient with atrial fibrillation initially heart rate was in the low 100s but as time went on she it started to increase up into the 140s.  The patient started on Cardizem drip.  Arterial blood gas done showed that her PCO2 was 60 pH was good PO2 on the BiPAP was above 100.  Patient not given Cardizem bolus because her blood  pressures were marginal.  Certainly the atrial fibrillation known problem for her she is on Lovenox could be contributing to her CHF.  Discussed with hospitalist they will admit.  To summarize patient started on BiPAP for respiratory failure elevated PCO2 as well as a low  oxygen saturation.  Patient required Cardizem drip due to atrial fibrillation with RVR.  In addition patient's initial troponin was borderline at 0.03.  Patient denied any chest pain.  Patient started on 80 mg of Lasix for CHF.  Has a history of acute on chronic diastolic CHF.   In addition patient very fidgety although very alert.  Never somnolent.  Patient takes Xanax at home patient initially given 1 mg of Ativan to help her tolerate the BiPAP because she does not like mass.  Eventually got a total of 2 more milligrams.  Final Clinical Impressions(s) / ED Diagnoses   Final diagnoses:  Acute on chronic diastolic congestive heart failure (H[facility])  Acute respiratory failure with hypoxia and hypercapnia ([facility])  Atrial fibrillation with RVR [facility])    ED Discharge Orders    None       Fredia Sorrow, MD 03/24/18 2051    Fredia Sorrow, MD 03/24/18 2052

## 2018-03-24 NOTE — ED Notes (Signed)
CRITICAL VALUE ALERT  Critical Value:  Troponin 0.03 mg/dl  Date & Time Notied:  03/24/18 & 1921 hrs  Provider Notified: Dr. Roderic Palau  Orders Received/Actions taken: N/A

## 2018-03-24 NOTE — Progress Notes (Addendum)
ANTICOAGULATION CONSULT NOTE - Initial Consult  Pharmacy Consult for warfarin Indication: atrial fibrillation  Allergies  Allergen Reactions  . Penicillins Hives, Itching, Swelling and Rash    Has patient had a PCN reaction causing immediate rash, facial/tongue/throat swelling, SOB or lightheadedness with hypotension: Yes Has patient had a PCN reaction causing severe rash involving mucus membranes or skin necrosis: Yes Has patient had a PCN reaction that required hospitalization: unknown Has patient had a PCN reaction occurring within the last 10 years: No If all of the above answers are "NO", then may proceed with Cephalosporin use.    Throat swelling    Patient Measurements:     Vital Signs: Temp: 99.9 F (37.7 C) (06/05 1733) Temp Source: Oral (06/05 1733) BP: 113/77 (06/05 2200) Pulse Rate: 116 (06/05 2200)  Labs: Recent Labs    03/23/18 1006 03/24/18 1751  HGB  --  9.7*  HCT  --  32.7*  PLT  --  220  INR 1.9*  --   CREATININE  --  1.11*  TROPONINI  --  0.03*    CrCl cannot be calculated (Unknown ideal weight.).   Medical History: Past Medical History:  Diagnosis Date  . Acute on chronic diastolic (congestive) heart failure ([place])   . Anxiety disorder   . Aortic stenosis    Status post St. Jude mechanical AVR 2007  . Asthma   . Atrial fibrillation ([place])   . Carcinoid tumor of colon 2007  . Coronary atherosclerosis of native coronary artery    Status post CABG 2007  . GERD (gastroesophageal reflux disease)   . History of colonoscopy 2003   Dr. Gala Romney - normal  . Hyperlipidemia   . Macromastia   . Pedal edema    Bilateral, chronic  . Peptic stricture of esophagus 10/04/2010   GE junction on last EGD by Dr. Gala Romney, benign biopsies  . RLS (restless legs syndrome)   . Schatzki's ring     Medications:   (Not in a hospital admission)  Assessment: Pharmacy consulted to dose warfarin for patient with atrial fibrillation.  Goal of Therapy:  INR  2-3 Monitor platelets by anticoagulation protocol: Yes   Plan: Warfarin 2 mg x 1 dose Monitor daily labs/INR and s/s of bleeding  Revonda Standard Selso Mannor 03/24/2018,10:34 PM

## 2018-03-24 NOTE — ED Notes (Signed)
I had to pull an extra 40 mg vial of Lasix, due to dropping one on the floor.

## 2018-03-24 NOTE — Progress Notes (Signed)
Pt off BIPAP on 3lpm trial per MD HR 134 [id number] 92

## 2018-03-24 NOTE — ED Notes (Signed)
Pt placed on bipap by respiratory. O2 sat 100%.

## 2018-03-24 NOTE — H&P (Signed)
History and Physical    [name] BJS:[id number] DOB: 03-May-1951 DOA: 03/24/2018  PCP: Octavio Graves, DO   Patient coming from: Home.  I have personally briefly reviewed patient's old medical records in [place]  Chief Complaint: Shortness of breath.  HPI: [name] is a 67 y.o. 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  She stated that she is supposed to be on 2 L on home O2, but has not been wearing it.  ED Course: Initial vital signs temperature 99.9 F pulse 117, respirations 26, blood pressure 112/67 mmHg and O2 sat 89% on room air.  The patient was given supplemental oxygen, 80 mg of furosemide and 3 separate doses of lorazepam 1 mg IVP due to anxiety.  The Patient was subsequently started on BiPAP ventilation, but was still anxious, tachypneic and restless. I order 2 mg of Morphine, which helped, but the patient asked for the mass to be taken off.  She is currently tolerating nasal cannula oxygen at 3 LPM.  Her white count was 9.1 with 79% neutrophils, 10% lymphocytes and 10% monocytes.  Hemoglobin is 9.7 g/dL and platelets 220.  ABG showed a pH of 7.357 with a PCO2 of 60.6 and P O2 of 164 mmHg.Sodium 141, potassium 3.2, chloride 99 CO2 33 mmol S/L.  BUN 11, creatinine 1.11, calcium 8.9, glucose 113, magnesium 1.6 and phosphorus 2.4 mg/dL.  Troponin level was 0.03 ng/mL and BNP 336.0 pg/mL.EKG was atrial fibrillation with RVR 116 bpm with nonspecific repolarization abnormality.  Her chest radiograph shows mild interstitial edema/CHF.  Please see  image and full radiology report for further detail.  Review of Systems: As per HPI otherwise 10 point review of systems negative.    Past Medical History:  Diagnosis Date  . Acute on chronic diastolic (congestive) heart failure ([place])   . Anxiety disorder   . Aortic stenosis    Status post St. Jude mechanical AVR 2007  . Asthma   . Atrial fibrillation ([place])   . Carcinoid tumor of colon 2007  . Coronary atherosclerosis of native coronary artery    Status post CABG 2007  . GERD (gastroesophageal reflux disease)   . History of colonoscopy 2003   Dr. Gala Romney - normal  . Hyperlipidemia   . Macromastia   . Pedal edema    Bilateral, chronic  . Peptic stricture of esophagus 10/04/2010   GE junction on last EGD by Dr. Gala Romney, benign biopsies  . RLS (restless legs syndrome)   . Schatzki's ring     Past Surgical History:  Procedure Laterality Date  . ABDOMINAL HYSTERECTOMY    . AORTIC VALVE REPLACEMENT  2007   #25 mm St. 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 APPENDECTOMY  2007  . BACK SURGERY     lumbar 4 and 5   . BIOPSY  02/05/2012   RMR:Two tongues of salmon-colored epithelium distal esophagus, very short-segment Barrett's s/p bx/Small hiatal hernia, otherwise normal stomach, D1, D2. Status post esophageal dilation. Biopsy showed GERD.  . Breast cyst removed     bilateral  . BREAST REDUCTION  SURGERY    . CESAREAN SECTION    . 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 COLONOSCOPY  02/05/2012   [id number] rectum, sigmoid diverticulosis,descending colon polyp , tubular adenoma  . CORONARY ARTERY BYPASS GRAFT     06/2006 - RIMA to RCA, SVG to RCA  . ESOPHAGOGASTRODUODENOSCOPY  10/03/2010   Dr. Gala Romney- schatzki's ring, shoft peptic stricture at GE junction.  . ESOPHAGOGASTRODUODENOSCOPY (EGD) WITH PROPOFOL N/A 02/08/2018   Procedure: ESOPHAGOGASTRODUODENOSCOPY (EGD) WITH PROPOFOL;  Surgeon: Daneil Dolin, MD;   Location: [facility];  Service: Endoscopy;  Laterality: N/A;  8:15am  . FOOT SURGERY     bilateral bunionectomy  . HERNIA REPAIR     with mesh  . LAPAROTOMY  2007   small bowel resection secondary to small bowel obstruction  . MALONEY DILATION  02/05/2012   Procedure: Venia Minks DILATION;  Surgeon: Daneil Dolin, MD;  Location: [facility];  Service: Endoscopy;  Laterality: N/A;  63mm   . MALONEY DILATION N/A 02/08/2018   Procedure: Venia Minks DILATION;  Surgeon: Daneil Dolin, MD;  Location: [facility];  Service: Endoscopy;  Laterality: N/A;  . Teeth removal       reports that she quit smoking about 29 years ago. Her smoking use included cigarettes. She has a 20.00 pack-year smoking history. She has never used smokeless tobacco. She reports that she does not drink alcohol or use drugs.  Allergies  Allergen Reactions  . Penicillins Hives, Itching, Swelling and Rash    Has patient had a PCN reaction causing immediate rash, facial/tongue/throat swelling, SOB or lightheadedness with hypotension: Yes Has patient had a PCN reaction causing severe rash involving mucus membranes or skin necrosis: Yes Has patient had a PCN reaction that required hospitalization: unknown Has patient had a PCN reaction occurring within the last 10 years: No If all of the above answers are "NO", then may proceed with Cephalosporin use.    Throat swelling    Family History  Problem Relation Age of Onset  . Stroke Mother   . Cancer Father   . Anesthesia problems Neg Hx   . Hypotension Neg Hx   . Malignant hyperthermia Neg Hx   . Pseudochol deficiency Neg Hx     Prior to Admission medications   Medication Sig Start Date End Date Taking? Authorizing Provider  albuterol (PROVENTIL HFA;VENTOLIN HFA) 108 (90 BASE) MCG/ACT inhaler Inhale 1-2 puffs into the lungs every 6 (six) hours as needed for wheezing or shortness of breath.    Yes [provider]  allopurinol (ZYLOPRIM) 100 MG tablet Take 100 mg by  mouth daily.   Yes [provider]  ALPRAZolam Duanne Moron) 1 MG tablet Take 1 mg by mouth every morning. *May take one tablet three times daily as needed for anxiety* 09/20/16  Yes [provider]  benzonatate (TESSALON) 200 MG capsule Take 1 capsule (200 mg total) by mouth 2 (two) times daily as needed for cough. Patient taking differently: Take 200 mg by mouth 2 (two) times daily.  03/19/17  Yes Mariel Aloe, MD  CRESTOR 10 MG tablet Take 10 mg by mouth at bedtime.  12/02/13  Yes [provider]  HYDROcodone-acetaminophen (NORCO) 10-325 MG per tablet Take 1 tablet by mouth at bedtime.    Yes [provider]  levothyroxine (SYNTHROID, LEVOTHROID) 50 MCG tablet Take 50 mcg by mouth daily before breakfast.    Yes [provider]  Multiple Vitamin (MULTIVITAMIN WITH MINERALS) TABS  tablet Take 1 tablet by mouth daily.   Yes [provider]  OXYGEN Inhale 2 L into the lungs daily.    Yes [provider]  rOPINIRole (REQUIP) 3 MG tablet Take 6 mg by mouth at bedtime.    Yes [provider]  warfarin (COUMADIN) 2 MG tablet Takes one tablet (2mg ) everyday Patient taking differently: Take 2-3 mg by mouth daily. TAKE 1.5 TABLETS (3 MG) BY MOUTH ON MONDAYS, WEDNESDAYS, FRIDAYS. TAKE 1 TABLET (2 MG) BY MOUTH ON SUNDAYS, [place], THURSDAYS, & SATURDAYS. 03/03/17  Yes BranchAlphonse Guild, MD  calcium carbonate (OS-CAL) 600 MG TABS tablet Take 600 mg by mouth 2 (two) times daily.    [provider]  cetirizine (ZYRTEC) 10 MG tablet Take 10 mg by mouth daily.      [provider]  clobetasol cream (TEMOVATE) 2.77 % Apply 1 application topically 2 (two) times daily as needed (FOR SKIN IRRITATION/REDNESS & BLISTERING ON LEGS).  02/26/17   [provider]  cyclobenzaprine (FLEXERIL) 10 MG tablet Take 10 mg by mouth 3 (three) times daily as needed for muscle spasms.     [provider]  enoxaparin (LOVENOX) 100 MG/ML  injection Inject 1 mL (100 mg total) into the skin every 12 (twelve) hours. Patient not taking: Reported on 03/24/2018 02/04/18   Satira Sark, MD  furosemide (LASIX) 40 MG tablet Take 40 mg by mouth 2 (two) times daily. PATIENT MAY INCREASE TO 2 TABLETS (80 MG) BY MOUTH TWICE DAILY WITH INCREASE FLUID RETENTION.    [provider]  magnesium oxide (MAG-OX) 400 MG tablet Take 400 mg by mouth at bedtime.    [provider]  metoprolol succinate (TOPROL-XL) 50 MG 24 hr tablet Take 1 tablet (50 mg total) by mouth daily. 12/20/16   Johnson, Clanford L, MD  potassium chloride (K-DUR) 10 MEQ tablet Take 1 tablet (10 mEq total) by mouth daily. 04/03/17 01/29/19  Lendon Colonel, NP  temazepam (RESTORIL) 15 MG capsule Take 15 mg by mouth at bedtime.     [provider]    Physical Exam: Vitals:   03/24/18 1900 03/24/18 1930 03/24/18 2010 03/24/18 2030  BP: 100/90 105/82 (!) 127/95 114/69  Pulse: (!) 122 (!) 129 (!) 148 (!) 120  Resp: (!) 28 19 (!) 28 (!) 27  Temp:      TempSrc:      SpO2: 96% 100% 97% 99%    Constitutional: NAD, calm, comfortable Eyes: PERRL, lids and conjunctivae normal ENMT: BiPAP mask on.  Mucous membranes are mildly dry. Posterior pharynx clear of any exudate or lesions. Neck: normal, supple, no masses, no thyromegaly Respiratory: Tachypneic at 28 bpm, bibasilar rales, no wheezing, no crackles. No accessory muscle use.  Cardiovascular: Irregularly irregular at 131 bpm, no murmurs / rubs / gallops.  2+ lower extremities pitting edema. 2+ pedal pulses. No carotid bruits.  Abdomen: Obese, soft, no tenderness, no masses palpated. No hepatosplenomegaly. Bowel sounds positive.  Musculoskeletal: no clubbing / cyanosis. Good ROM, no contractures. Normal muscle tone.  Skin: Positive bilateral pretibial erythema and small ecchymosis areas on lower extremities.  Unable to fully evaluate due to the patient being on BiPAP. Neurologic: CN 2-12 grossly intact.  Sensation intact, DTR normal. Strength 5/5 in all 4.  Psychiatric:  Alert and oriented x 3. Very anxious mood.    Labs on Admission: I have personally reviewed following labs and imaging studies  CBC: Recent Labs  Lab 03/24/18 1751  WBC 9.1  NEUTROABS 7.2  HGB 9.7*  HCT 32.7*  MCV 93.2  PLT 258   Basic Metabolic Panel: Recent Labs  Lab 03/24/18 1751  NA 141  K 3.2*  CL 99*  CO2 33*  GLUCOSE 113*  BUN 11  CREATININE 1.11*  CALCIUM 8.9  MG 1.6*  PHOS 2.4*   GFR: CrCl cannot be calculated (Unknown ideal weight.). Liver Function Tests: No results for input(s): AST, ALT, ALKPHOS, BILITOT, PROT, ALBUMIN in the last 168 hours. No results for input(s): LIPASE, AMYLASE in the last 168 hours. No results for input(s): AMMONIA in the last 168 hours. Coagulation Profile: Recent Labs  Lab 03/23/18 1006  INR 1.9*   Cardiac Enzymes: Recent Labs  Lab 03/24/18 1751  TROPONINI 0.03*   BNP (last 3 results) No results for input(s): PROBNP in the last 8760 hours. HbA1C: No results for input(s): HGBA1C in the last 72 hours. CBG: No results for input(s): GLUCAP in the last 168 hours. Lipid Profile: No results for input(s): CHOL, HDL, LDLCALC, TRIG, CHOLHDL, LDLDIRECT in the last 72 hours. Thyroid Function Tests: No results for input(s): TSH, T4TOTAL, FREET4, T3FREE, THYROIDAB in the last 72 hours. Anemia Panel: No results for input(s): VITAMINB12, FOLATE, FERRITIN, TIBC, IRON, RETICCTPCT in the last 72 hours. Urine analysis: No results found for: COLORURINE, APPEARANCEUR, LABSPEC, [place], GLUCOSEU, HGBUR, BILIRUBINUR, KETONESUR, PROTEINUR, UROBILINOGEN, NITRITE, LEUKOCYTESUR  Radiological Exams on Admission: Dg Chest Port 1 View  Result Date: 03/24/2018 CLINICAL DATA:  Mild to CIS, shortness of breath, distress EXAM: PORTABLE CHEST 1 VIEW COMPARISON:  11/04/2017 FINDINGS: Prior CABG and valve replacement. Cardiomegaly with vascular congestion and diffuse interstitial  opacities throughout the lungs, likely interstitial edema. No effusions or acute bony abnormality. IMPRESSION: Mild interstitial edema/CHF. Electronically Signed   By: Rolm Baptise M.D.   On: 03/24/2018 18:19   12/19/2016 echocardiogram ------------------------------------------------------------------- LV EF: 65% -   70%  ------------------------------------------------------------------- Indications:      CHF - 428.0.  ------------------------------------------------------------------- History:   PMH:   Atrial fibrillation.  Chronic obstructive pulmonary disease.  PMH:  GERD, Bilateral leg edema.  Risk factors:  Dyslipidemia.  ------------------------------------------------------------------- Study Conclusions  - Left ventricle: The cavity size was normal. Wall thickness was   increased in a pattern of mild LVH. Systolic function was   vigorous. The estimated ejection fraction was in the range of 65%   to 70%. Wall motion was normal; there were no regional wall   motion abnormalities. The study is not technically sufficient to   allow evaluation of LV diastolic function. - Aortic valve: A St. Jude [employer] mechanical prosthesis was   present. Mean gradient (S): 9 mm Hg. Peak gradient (S): 18 mm Hg.   VTI ratio of LVOT to aortic valve: 0.43. - Mitral valve: Moderately to severely calcified annulus. Mildly   thickened leaflets . There was mild regurgitation. - Left atrium: The atrium was moderately dilated. - Right atrium: Central venous pressure (est): 8 mm Hg. - Atrial septum: No defect or patent foramen ovale was identified. - Tricuspid valve: There was mild regurgitation. - Pulmonary arteries: Systolic pressure could not be accurately   estimated. - Pericardium, extracardiac: There was no pericardial effusion.  Impressions:  - Mild LVH with LVEF 65-70%. Indeterminate diastolic function.   Moderate left atrial enlargement. Moderately to severely   calcified mitral  annulus with mild mitral regurgitation. St. Jude   prosthesis in aortic position with grossly normal function, mean   gradient 9 mmHg, no obvious perivalvular leak. Mild tricuspid  regurgitation.   EKG: Independently reviewed. Vent. rate 116 BPM PR interval * ms QRS duration 86 ms QT/QTc 293/400 ms P-R-T axes * 69 259 Atrial fibrillation Nonspecific repol abnormality, diffuse leads  Assessment/Plan Principal Problem:   Acute on chronic respiratory failure with hypercapnia (HCC) Admit to SDU/inpatient. Continue supplemental oxygen. BiPAP ventilation as needed. Xopenex plus ipratropium nebs every 6 hours as needed. No wheezing on exam.  Will defer glucocorticoids. Taper oxygen requirement as possible.  Active Problems:   Atrial fibrillation with RVR (HCC) CHA?DS?-VASc Score of at least 5. Continue metoprolol for rate control. Warfarin per pharmacy for anticoagulation.    Elevated troponin Likely due to demand ischemia secondary to A. fib with RVR. Trend troponin level. Check echocardiogram in a.m.    Hyperlipidemia Continue Crestor 10 p.o. at bedtime. Monitor LFTs as needed. Fasting lipid follow-up per primary.    Coronary atherosclerosis of native coronary artery No chest pain. Continue beta-blocker, statin and warfarin.    Anemia Anemia panel done 2 months ago has mild increase in TIBC, ferritin is low normal. Continue GI prophylaxis. Monitor hematocrit and hemoglobin.    Hypomagnesemia Replacing. Follow-up level as needed.    Hypokalemia Replacing. Monitor potassium level.    Hypophosphatemia Replacing. Follow-up phosphorus level as needed.    DVT prophylaxis: On Eliquis. Code Status: Full code. Family Communication: Her spouse was present in the ED. Disposition Plan: Admit to stepdown for BiPAP ventilation as needed and to continue the Cardizem infusion. Consults called:  Admission status: Inpatient/stepdown.   Reubin Milan MD [facility] Pager [phone].  If 7PM-7AM, please contact night-coverage [email or web address] Password [email or web address]  03/24/2018, 10:00 PM

## 2018-03-24 NOTE — ED Triage Notes (Signed)
Pt c/o coughing up blood since yesterday. Pt panting with breaths. Supposed to be on 02 as needed at home. Was not wearing 02 on way here. Denies pain.

## 2018-03-25 ENCOUNTER — Inpatient Hospital Stay (HOSPITAL_COMMUNITY): Payer: Medicare HMO

## 2018-03-25 DIAGNOSIS — I25119 Atherosclerotic heart disease of native coronary artery with unspecified angina pectoris: Secondary | ICD-10-CM

## 2018-03-25 DIAGNOSIS — I4891 Unspecified atrial fibrillation: Secondary | ICD-10-CM

## 2018-03-25 DIAGNOSIS — R748 Abnormal levels of other serum enzymes: Secondary | ICD-10-CM

## 2018-03-25 DIAGNOSIS — I340 Nonrheumatic mitral (valve) insufficiency: Secondary | ICD-10-CM

## 2018-03-25 DIAGNOSIS — J9622 Acute and chronic respiratory failure with hypercapnia: Secondary | ICD-10-CM

## 2018-03-25 LAB — CBC WITH DIFFERENTIAL/PLATELET
Basophils Absolute: 0 10*3/uL (ref 0.0–0.1)
Basophils Relative: 0 %
EOS ABS: 0.1 10*3/uL (ref 0.0–0.7)
Eosinophils Relative: 1 %
HCT: 33.3 % — ABNORMAL LOW (ref 36.0–46.0)
HEMOGLOBIN: 9.6 g/dL — AB (ref 12.0–15.0)
LYMPHS ABS: 2 10*3/uL (ref 0.7–4.0)
LYMPHS PCT: 22 %
MCH: 27.2 pg (ref 26.0–34.0)
MCHC: 28.8 g/dL — ABNORMAL LOW (ref 30.0–36.0)
MCV: 94.3 fL (ref 78.0–100.0)
Monocytes Absolute: 1.1 10*3/uL — ABNORMAL HIGH (ref 0.1–1.0)
Monocytes Relative: 13 %
NEUTROS PCT: 64 %
Neutro Abs: 5.7 10*3/uL (ref 1.7–7.7)
Platelets: 235 10*3/uL (ref 150–400)
RBC: 3.53 MIL/uL — AB (ref 3.87–5.11)
RDW: 15.8 % — ABNORMAL HIGH (ref 11.5–15.5)
WBC: 9 10*3/uL (ref 4.0–10.5)

## 2018-03-25 LAB — COMPREHENSIVE METABOLIC PANEL
ALK PHOS: 76 U/L (ref 38–126)
ALT: 17 U/L (ref 14–54)
AST: 30 U/L (ref 15–41)
Albumin: 3.5 g/dL (ref 3.5–5.0)
Anion gap: 8 (ref 5–15)
BUN: 12 mg/dL (ref 6–20)
CO2: 34 mmol/L — AB (ref 22–32)
CREATININE: 1.26 mg/dL — AB (ref 0.44–1.00)
Calcium: 8.5 mg/dL — ABNORMAL LOW (ref 8.9–10.3)
Chloride: 98 mmol/L — ABNORMAL LOW (ref 101–111)
GFR calc non Af Amer: 43 mL/min — ABNORMAL LOW (ref >60)
GFR, EST AFRICAN AMERICAN: 50 mL/min — AB (ref >60)
Glucose, Bld: 103 mg/dL — ABNORMAL HIGH (ref 65–99)
Potassium: 3.9 mmol/L (ref 3.5–5.1)
SODIUM: 140 mmol/L (ref 135–145)
Total Bilirubin: 1.3 mg/dL — ABNORMAL HIGH (ref 0.3–1.2)
Total Protein: 7.2 g/dL (ref 6.5–8.1)

## 2018-03-25 LAB — TROPONIN I
TROPONIN I: 0.14 ng/mL — AB (ref <0.03)
TROPONIN I: 0.19 ng/mL — AB (ref <0.03)

## 2018-03-25 LAB — ECHOCARDIOGRAM COMPLETE
HEIGHTINCHES: 67 in
Weight: 3234.5891 oz

## 2018-03-25 LAB — PROTIME-INR
INR: 1.76
Prothrombin Time: 20.4 seconds — ABNORMAL HIGH (ref 11.4–15.2)

## 2018-03-25 LAB — MRSA PCR SCREENING: MRSA BY PCR: NEGATIVE

## 2018-03-25 MED ORDER — LEVALBUTEROL HCL 1.25 MG/0.5ML IN NEBU
1.2500 mg | INHALATION_SOLUTION | Freq: Four times a day (QID) | RESPIRATORY_TRACT | Status: DC
  Administered 2018-03-25 (×2): 1.25 mg via RESPIRATORY_TRACT
  Filled 2018-03-25 (×2): qty 0.5

## 2018-03-25 MED ORDER — IPRATROPIUM BROMIDE 0.02 % IN SOLN
0.5000 mg | Freq: Four times a day (QID) | RESPIRATORY_TRACT | Status: DC
  Administered 2018-03-25 (×2): 0.5 mg via RESPIRATORY_TRACT
  Filled 2018-03-25 (×2): qty 2.5

## 2018-03-25 MED ORDER — CALCIUM CARBONATE 1250 (500 CA) MG PO TABS
1.0000 | ORAL_TABLET | Freq: Two times a day (BID) | ORAL | Status: DC
  Administered 2018-03-25 – 2018-03-31 (×14): 500 mg via ORAL
  Filled 2018-03-25 (×15): qty 1

## 2018-03-25 MED ORDER — ACETAMINOPHEN 325 MG PO TABS
650.0000 mg | ORAL_TABLET | Freq: Four times a day (QID) | ORAL | Status: DC | PRN
  Administered 2018-03-27: 650 mg via ORAL
  Filled 2018-03-25: qty 2

## 2018-03-25 MED ORDER — ADULT MULTIVITAMIN W/MINERALS CH
1.0000 | ORAL_TABLET | Freq: Every day | ORAL | Status: DC
  Administered 2018-03-25 – 2018-03-31 (×7): 1 via ORAL
  Filled 2018-03-25 (×7): qty 1

## 2018-03-25 MED ORDER — DILTIAZEM HCL-DEXTROSE 100-5 MG/100ML-% IV SOLN (PREMIX)
5.0000 mg/h | INTRAVENOUS | Status: DC
  Administered 2018-03-25: 5 mg/h via INTRAVENOUS
  Administered 2018-03-25: 10 mg/h via INTRAVENOUS
  Administered 2018-03-26: 5 mg/h via INTRAVENOUS
  Administered 2018-03-26: 10 mg/h via INTRAVENOUS
  Administered 2018-03-27 – 2018-03-28 (×2): 5 mg/h via INTRAVENOUS
  Administered 2018-03-29: 2.5 mg/h via INTRAVENOUS
  Filled 2018-03-25 (×6): qty 100

## 2018-03-25 MED ORDER — ONDANSETRON HCL 4 MG PO TABS: 4.0000 mg | ORAL_TABLET | Freq: Four times a day (QID) | ORAL | Status: DC | PRN

## 2018-03-25 MED ORDER — WARFARIN SODIUM 1 MG PO TABS
3.0000 mg | ORAL_TABLET | Freq: Once | ORAL | Status: AC
  Administered 2018-03-25: 3 mg via ORAL
  Filled 2018-03-25: qty 1

## 2018-03-25 MED ORDER — LORATADINE 10 MG PO TABS
10.0000 mg | ORAL_TABLET | Freq: Every day | ORAL | Status: DC
  Administered 2018-03-25 – 2018-03-31 (×7): 10 mg via ORAL
  Filled 2018-03-25 (×7): qty 1

## 2018-03-25 MED ORDER — ROSUVASTATIN CALCIUM 10 MG PO TABS
10.0000 mg | ORAL_TABLET | Freq: Every day | ORAL | Status: DC
  Administered 2018-03-25 – 2018-03-30 (×7): 10 mg via ORAL
  Filled 2018-03-25 (×7): qty 1

## 2018-03-25 MED ORDER — POTASSIUM PHOSPHATE MONOBASIC 500 MG PO TABS
500.0000 mg | ORAL_TABLET | Freq: Three times a day (TID) | ORAL | Status: DC
  Filled 2018-03-25 (×3): qty 1

## 2018-03-25 MED ORDER — LEVALBUTEROL HCL 1.25 MG/0.5ML IN NEBU: 1.2500 mg | INHALATION_SOLUTION | Freq: Four times a day (QID) | RESPIRATORY_TRACT | Status: DC

## 2018-03-25 MED ORDER — POTASSIUM CHLORIDE CRYS ER 10 MEQ PO TBCR
10.0000 meq | EXTENDED_RELEASE_TABLET | Freq: Three times a day (TID) | ORAL | Status: DC
  Administered 2018-03-25 – 2018-03-27 (×9): 10 meq via ORAL
  Filled 2018-03-25 (×14): qty 1

## 2018-03-25 MED ORDER — FUROSEMIDE 10 MG/ML PO SOLN
40.0000 mg | Freq: Two times a day (BID) | ORAL | Status: DC
  Administered 2018-03-25 (×2): 40 mg via ORAL
  Filled 2018-03-25: qty 5
  Filled 2018-03-25 (×2): qty 4
  Filled 2018-03-25: qty 5
  Filled 2018-03-25 (×4): qty 4
  Filled 2018-03-25: qty 5

## 2018-03-25 MED ORDER — BUDESONIDE 0.25 MG/2ML IN SUSP
0.2500 mg | Freq: Two times a day (BID) | RESPIRATORY_TRACT | Status: DC
  Administered 2018-03-25 – 2018-03-31 (×12): 0.25 mg via RESPIRATORY_TRACT
  Filled 2018-03-25 (×12): qty 2

## 2018-03-25 MED ORDER — LEVOTHYROXINE SODIUM 50 MCG PO TABS
50.0000 ug | ORAL_TABLET | Freq: Every day | ORAL | Status: DC
  Administered 2018-03-25 – 2018-03-31 (×7): 50 ug via ORAL
  Filled 2018-03-25 (×3): qty 2
  Filled 2018-03-25: qty 1
  Filled 2018-03-25 (×3): qty 2

## 2018-03-25 MED ORDER — ROPINIROLE HCL 1 MG PO TABS
3.0000 mg | ORAL_TABLET | Freq: Two times a day (BID) | ORAL | Status: DC
  Administered 2018-03-25 – 2018-03-27 (×6): 3 mg via ORAL
  Filled 2018-03-25 (×11): qty 3

## 2018-03-25 MED ORDER — LEVALBUTEROL HCL 1.25 MG/0.5ML IN NEBU
1.2500 mg | INHALATION_SOLUTION | Freq: Three times a day (TID) | RESPIRATORY_TRACT | Status: DC
  Administered 2018-03-26 – 2018-03-31 (×16): 1.25 mg via RESPIRATORY_TRACT
  Filled 2018-03-25 (×15): qty 0.5

## 2018-03-25 MED ORDER — TEMAZEPAM 15 MG PO CAPS
15.0000 mg | ORAL_CAPSULE | Freq: Every evening | ORAL | Status: DC | PRN
  Administered 2018-03-25 – 2018-03-26 (×2): 15 mg via ORAL
  Filled 2018-03-25 (×2): qty 1

## 2018-03-25 MED ORDER — BENZONATATE 100 MG PO CAPS
200.0000 mg | ORAL_CAPSULE | Freq: Two times a day (BID) | ORAL | Status: DC | PRN
  Administered 2018-03-27: 200 mg via ORAL
  Filled 2018-03-25: qty 2

## 2018-03-25 MED ORDER — ALPRAZOLAM 0.5 MG PO TABS
1.0000 mg | ORAL_TABLET | Freq: Every morning | ORAL | Status: DC
  Administered 2018-03-25 – 2018-03-27 (×2): 1 mg via ORAL
  Filled 2018-03-25 (×2): qty 2

## 2018-03-25 MED ORDER — ALLOPURINOL 100 MG PO TABS
100.0000 mg | ORAL_TABLET | Freq: Every day | ORAL | Status: DC
  Administered 2018-03-25 – 2018-03-31 (×7): 100 mg via ORAL
  Filled 2018-03-25 (×7): qty 1

## 2018-03-25 MED ORDER — ACETAMINOPHEN 650 MG RE SUPP: 650.0000 mg | Freq: Four times a day (QID) | RECTAL | Status: DC | PRN

## 2018-03-25 MED ORDER — LEVALBUTEROL HCL 1.25 MG/0.5ML IN NEBU
1.2500 mg | INHALATION_SOLUTION | Freq: Four times a day (QID) | RESPIRATORY_TRACT | Status: DC | PRN
  Administered 2018-03-26: 1.25 mg via RESPIRATORY_TRACT
  Filled 2018-03-25: qty 0.5

## 2018-03-25 MED ORDER — K PHOS MONO-SOD PHOS DI & MONO 155-852-130 MG PO TABS
500.0000 mg | ORAL_TABLET | Freq: Three times a day (TID) | ORAL | Status: AC
  Administered 2018-03-25 (×3): 500 mg via ORAL
  Filled 2018-03-25 (×3): qty 2

## 2018-03-25 MED ORDER — ONDANSETRON HCL 4 MG/2ML IJ SOLN: 4.0000 mg | Freq: Four times a day (QID) | INTRAMUSCULAR | Status: DC | PRN

## 2018-03-25 MED ORDER — MAGNESIUM OXIDE 400 (241.3 MG) MG PO TABS
400.0000 mg | ORAL_TABLET | Freq: Every day | ORAL | Status: DC
  Administered 2018-03-25 – 2018-03-30 (×7): 400 mg via ORAL
  Filled 2018-03-25 (×7): qty 1

## 2018-03-25 MED ORDER — METOPROLOL SUCCINATE ER 50 MG PO TB24
50.0000 mg | ORAL_TABLET | Freq: Every day | ORAL | Status: DC
  Administered 2018-03-25: 50 mg via ORAL
  Filled 2018-03-25: qty 1

## 2018-03-25 MED ORDER — CYCLOBENZAPRINE HCL 10 MG PO TABS
10.0000 mg | ORAL_TABLET | Freq: Three times a day (TID) | ORAL | Status: DC | PRN
  Administered 2018-03-25: 10 mg via ORAL
  Filled 2018-03-25: qty 1

## 2018-03-25 MED ORDER — PANTOPRAZOLE SODIUM 40 MG PO TBEC
40.0000 mg | DELAYED_RELEASE_TABLET | Freq: Every day | ORAL | Status: DC
  Administered 2018-03-25 – 2018-03-31 (×7): 40 mg via ORAL
  Filled 2018-03-25 (×7): qty 1

## 2018-03-25 MED ORDER — HYDROCODONE-ACETAMINOPHEN 10-325 MG PO TABS
1.0000 | ORAL_TABLET | Freq: Every day | ORAL | Status: DC
  Administered 2018-03-25 – 2018-03-26 (×3): 1 via ORAL
  Filled 2018-03-25 (×3): qty 1

## 2018-03-25 MED ORDER — IPRATROPIUM BROMIDE 0.02 % IN SOLN
0.5000 mg | Freq: Three times a day (TID) | RESPIRATORY_TRACT | Status: DC
  Administered 2018-03-26 – 2018-03-31 (×17): 0.5 mg via RESPIRATORY_TRACT
  Filled 2018-03-25 (×17): qty 2.5

## 2018-03-25 NOTE — Consult Note (Signed)
Cardiology Consultation:   Patient ID: [name]; [id number]; 03/28/1951   Admit date: 03/24/2018 Date of Consult: 03/25/2018  Primary Care Provider: Octavio Graves, DO Primary Cardiologist: Dr. Satira Sark   Patient Profile:   [name] is a 67 y.o. female with a history of chronic diastolic CHF, CAD, aortic stenosis with history of St. 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 Manuella Ghazi.  History of Present Illness:   [name] presents to the hospital complaining of at least a week of increasing shortness of breath, intermittent cough with minor blood-tinged sputum, no fevers or chills, not using oxygen consistently at home.  She states that her weight has not fluctuated very much and she continues on standard Lasix dosing.  She is noted to have acute on chronic hypoxic respiratory failure as well as probable acute on chronic diastolic heart failure with mild interstitial edema on chest x-ray.  She has permanent atrial fibrillation, presently with elevated heart rates in the setting of acute illness.  She is on Toprol-XL for heart rate control as well as Coumadin which she takes concurrently for history of St. Jude mechanical AVR.  Echocardiogram from March 2018 revealed LVEF 65 to 70% with grossly normal function of aortic prosthesis and mean gradient 9 mmHg.  Past Medical History:  Diagnosis Date  . Acute on chronic diastolic (congestive) heart failure ([place])   . Anxiety disorder   . Aortic stenosis    Status post St. Jude mechanical AVR 2007  . Asthma   . Atrial fibrillation ([place])   . Carcinoid tumor of colon 2007  . Coronary atherosclerosis of native  coronary artery    Status post CABG 2007  . GERD (gastroesophageal reflux disease)   . History of colonoscopy 2003   Dr. Gala Romney - normal  . Hyperlipidemia   . Macromastia   . Pedal edema    Bilateral, chronic  . Peptic stricture of esophagus 10/04/2010   GE junction on last EGD by Dr. Gala Romney, benign biopsies  . RLS (restless legs syndrome)   . Schatzki's ring     Past Surgical History:  Procedure Laterality Date  . ABDOMINAL HYSTERECTOMY    . AORTIC VALVE REPLACEMENT  2007   #25 mm St. 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 APPENDECTOMY  2007  . BACK SURGERY     lumbar 4 and 5   . BIOPSY  02/05/2012   RMR:Two tongues of salmon-colored epithelium distal esophagus, very short-segment Barrett's s/p bx/Small hiatal hernia, otherwise normal stomach, D1, D2. Status post esophageal dilation. Biopsy showed GERD.  . Breast cyst removed     bilateral  . BREAST REDUCTION SURGERY    . CESAREAN SECTION    . 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 COLONOSCOPY  02/05/2012   [facility]:QQIWLN rectum, sigmoid diverticulosis,descending colon polyp , tubular adenoma  . CORONARY ARTERY BYPASS GRAFT     06/2006 - RIMA to RCA, SVG to RCA  . ESOPHAGOGASTRODUODENOSCOPY  10/03/2010   Dr. Gala Romney- schatzki's ring, shoft peptic stricture at GE junction.  . ESOPHAGOGASTRODUODENOSCOPY (EGD) WITH PROPOFOL N/A 02/08/2018   Procedure: ESOPHAGOGASTRODUODENOSCOPY (EGD) WITH PROPOFOL;  Surgeon:  Rourk, Cristopher Estimable, MD;  Location: [facility];  Service: Endoscopy;  Laterality: N/A;  8:15am  . FOOT SURGERY     bilateral bunionectomy  . HERNIA REPAIR     with mesh  . LAPAROTOMY  2007   small bowel resection secondary to small bowel obstruction  . MALONEY DILATION  02/05/2012   Procedure: Venia Minks DILATION;  Surgeon: Daneil Dolin, MD;  Location: [facility];  Service: Endoscopy;  Laterality: N/A;  44mm   . MALONEY DILATION N/A 02/08/2018   Procedure: Venia Minks  DILATION;  Surgeon: Daneil Dolin, MD;  Location: [facility];  Service: Endoscopy;  Laterality: N/A;  . Teeth removal       Inpatient Medications: Scheduled Meds: . allopurinol  100 mg Oral Daily  . ALPRAZolam  1 mg Oral q morning - 10a  . calcium carbonate  1 tablet Oral BID WC  . furosemide  40 mg Oral BID  . HYDROcodone-acetaminophen  1 tablet Oral QHS  . levothyroxine  50 mcg Oral QAC breakfast  . loratadine  10 mg Oral Daily  . magnesium oxide  400 mg Oral QHS  . metoprolol succinate  50 mg Oral Daily  . multivitamin with minerals  1 tablet Oral Daily  . pantoprazole  40 mg Oral Daily  . phosphorus  500 mg Oral TID WC & HS  . potassium chloride  10 mEq Oral TID  . rOPINIRole  3 mg Oral BID  . rosuvastatin  10 mg Oral QHS  . Warfarin - Pharmacist Dosing Inpatient   Does not apply q1800   Continuous Infusions: . diltiazem (CARDIZEM) infusion 10 mg/hr (03/25/18 0445)   PRN Meds: acetaminophen **OR** acetaminophen, benzonatate, cyclobenzaprine, ipratropium, levalbuterol, morphine injection, ondansetron **OR** ondansetron (ZOFRAN) IV, temazepam  Allergies:    Allergies  Allergen Reactions  . Penicillins Hives, Itching, Swelling and Rash    Has patient had a PCN reaction causing immediate rash, facial/tongue/throat swelling, SOB or lightheadedness with hypotension: Yes Has patient had a PCN reaction causing severe rash involving mucus membranes or skin necrosis: Yes Has patient had a PCN reaction that required hospitalization: unknown Has patient had a PCN reaction occurring within the last 10 years: No If all of the above answers are "NO", then may proceed with Cephalosporin use.    Throat swelling    Social History:   Social History   Socioeconomic History  . Marital status: Married    Spouse name: Not on file  . Number of children: Not on file  . Years of education: Not on file  . Highest education level: Not on file  Occupational History  . Not on file    Social Needs  . Financial resource strain: Not on file  . Food insecurity:    Worry: Not on file    Inability: Not on file  . Transportation needs:    Medical: Not on file    Non-medical: Not on file  Tobacco Use  . Smoking status: Former Smoker    Packs/day: 1.00    Years: 20.00    Pack years: 20.00    Types: Cigarettes    Last attempt to quit: 10/20/1988    Years since quitting: 29.4  . Smokeless tobacco: Never Used  Substance and Sexual Activity  . Alcohol use: No    Alcohol/week: 0.0 oz  . Drug use: No  . Sexual activity: Yes    Partners: Male    Birth control/protection: None    Comment: spouse  Lifestyle  .  Physical activity:    Days per week: Not on file    Minutes per session: Not on file  . Stress: Not on file  Relationships  . Social connections:    Talks on phone: Not on file    Gets together: Not on file    Attends religious service: Not on file    Active member of club or organization: Not on file    Attends meetings of clubs or organizations: Not on file    Relationship status: Not on file  . Intimate partner violence:    Fear of current or ex partner: Not on file    Emotionally abused: Not on file    Physically abused: Not on file    Forced sexual activity: Not on file  Other Topics Concern  . Not on file  Social History Narrative  . Not on file    Family History:   The patient's family history includes Cancer in her father; Stroke in her mother. There is no history of Anesthesia problems, Hypotension, Malignant hyperthermia, or Pseudochol deficiency.  ROS:  Please see the history of present illness.  Reports recent limited appetite, also leg swelling and venous stasis.  All other ROS reviewed and negative.     Physical Exam/Data:   Vitals:   03/25/18 0715 03/25/18 0730 03/25/18 0745 03/25/18 0800  BP: 118/80 120/81 139/76 126/79  Pulse: 97 92 (!) 107 97  Resp: (!) 23 (!) 21 19 (!) 22  Temp:      TempSrc:      SpO2: 94% 96% 92% 93%   Weight:      Height:        Intake/Output Summary (Last 24 hours) at 03/25/2018 0918 Last data filed at 03/25/2018 0800 Gross per 24 hour  Intake 217.79 ml  Output -  Net 217.79 ml   Filed Weights   03/25/18 0049 03/25/18 0445  Weight: 200 lb 9.9 oz (91 kg) 202 lb 2.6 oz (91.7 kg)   Body mass index is 31.66 kg/m.   Gen: Patient appears comfortable at rest. HEENT: Conjunctiva and lids normal, oropharynx clear. Neck: Supple, elevated JVP, no carotid bruits, no thyromegaly. Lungs: Decreased breath sounds with prolonged expiratory phase and wheezing, nonlabored breathing at rest. Cardiac: Irregularly irregular, no S3, mechanical click in S2 with soft systolic murmur no pericardial rub. Abdomen: Soft, nontender, bowel sounds present, no guarding or rebound. Extremities: Mild lower leg edema with venous stasis changes, distal pulses 2+. Skin: Warm and dry. Musculoskeletal: No kyphosis. Neuropsychiatric: Alert and oriented x3, affect grossly appropriate.  EKG:  I personally reviewed the tracing from 03/24/2018 which showed rapid atrial fibrillation with nonspecific ST-T changes.  Telemetry:  I personally reviewed telemetry which shows atrial fibrillation.  Relevant CV Studies:  Echocardiogram 12/19/2016: Study Conclusions  - Left ventricle: The cavity size was normal. Wall thickness was   increased in a pattern of mild LVH. Systolic function was   vigorous. The estimated ejection fraction was in the range of 65%   to 70%. Wall motion was normal; there were no regional wall   motion abnormalities. The study is not technically sufficient to   allow evaluation of LV diastolic function. - Aortic valve: A St. Jude [employer] mechanical prosthesis was   present. Mean gradient (S): 9 mm Hg. Peak gradient (S): 18 mm Hg.   VTI ratio of LVOT to aortic valve: 0.43. - Mitral valve: Moderately to severely calcified annulus. Mildly   thickened leaflets . There was mild regurgitation. -  Left  atrium: The atrium was moderately dilated. - Right atrium: Central venous pressure (est): 8 mm Hg. - Atrial septum: No defect or patent foramen ovale was identified. - Tricuspid valve: There was mild regurgitation. - Pulmonary arteries: Systolic pressure could not be accurately   estimated. - Pericardium, extracardiac: There was no pericardial effusion.  Impressions:  - Mild LVH with LVEF 65-70%. Indeterminate diastolic function.   Moderate left atrial enlargement. Moderately to severely   calcified mitral annulus with mild mitral regurgitation. St. Jude   prosthesis in aortic position with grossly normal function, mean   gradient 9 mmHg, no obvious perivalvular leak. Mild tricuspid   regurgitation.  Laboratory Data:  Chemistry Recent Labs  Lab 03/24/18 1751 03/25/18 0531  NA 141 140  K 3.2* 3.9  CL 99* 98*  CO2 33* 34*  GLUCOSE 113* 103*  BUN 11 12  CREATININE 1.11* 1.26*  CALCIUM 8.9 8.5*  GFRNONAA 51* 43*  GFRAA 59* 50*  ANIONGAP 9 8    Recent Labs  Lab 03/25/18 0531  PROT 7.2  ALBUMIN 3.5  AST 30  ALT 17  ALKPHOS 76  BILITOT 1.3*   Hematology Recent Labs  Lab 03/24/18 1751 03/25/18 0531  WBC 9.1 9.0  RBC 3.51* 3.53*  HGB 9.7* 9.6*  HCT 32.7* 33.3*  MCV 93.2 94.3  MCH 27.6 27.2  MCHC 29.7* 28.8*  RDW 15.5 15.8*  PLT 220 235   Cardiac Enzymes Recent Labs  Lab 03/24/18 1751 03/24/18 2354 03/25/18 0531  TROPONINI 0.03* 0.19* 0.14*   No results for input(s): TROPIPOC in the last 168 hours.  BNP Recent Labs  Lab 03/24/18 1754  BNP 336.0*    Radiology/Studies:  Dg Chest Port 1 View  Result Date: 03/24/2018 CLINICAL DATA:  Mild to CIS, shortness of breath, distress EXAM: PORTABLE CHEST 1 VIEW COMPARISON:  11/04/2017 FINDINGS: Prior CABG and valve replacement. Cardiomegaly with vascular congestion and diffuse interstitial opacities throughout the lungs, likely interstitial edema. No effusions or acute bony abnormality. IMPRESSION: Mild  interstitial edema/CHF. Electronically Signed   By: Rolm Baptise M.D.   On: 03/24/2018 18:19    Assessment and Plan:   1.  Acute on chronic hypoxic/hypercarbic respiratory failure.  Patient reports at least a one-week history of increasing shortness of breath with intermittent cough and scant hemoptysis.  She is short of breath now with active wheezing and prolonged expiratory phase suggesting significant pulmonary component to symptoms.  She also has acute on chronic diastolic heart failure.  2.  Permanent atrial fibrillation, currently with RVR in the setting of acute illness.  She is on intravenous diltiazem at this time as well as standard dose Toprol-XL 50 mg daily which she takes as an outpatient.  3.  History of St. Jude mechanical AVR, stable function by echocardiogram last year with follow-up study pending.  She is on Coumadin per pharmacy, INR presently subtherapeutic.  4.  CAD status post CABG at the time of valve surgery in 2007.  No recent angina symptoms.  Troponin I levels are low level abnormal at 0.03, 0.19, and 0.14, most consistent with demand ischemia.  ECG shows nonspecific ST changes.  At this point do not suspect frank ACS.  Agree with IV Lasix to achieve diuresis with component of acute on chronic diastolic heart failure, heart rate control of her atrial fibrillation will also help.  She also would benefit from consultation from Dr. Luan Pulling to stabilize pulmonary status as this looks to be a main contributor to her  presentation.  Plan to review echocardiogram when available.   Signed, Rozann Lesches, MD  03/25/2018 9:18 AM

## 2018-03-25 NOTE — Care Management Note (Addendum)
Case Management Note  Patient Details  Name: [name] MRN: [id number] Date of Birth: May 28, 1951  Subjective/Objective:     Adm acute on chronic respiratory failure. From home with husband. Ind with ADL's. Has continuous oxygen, unsure of provider.  Has PCP- husband drives to appts. Patient drives at times. No home health.               Action/Plan: Anticipate DC home. CM following.   Expected Discharge Date:  03/29/18               Expected Discharge Plan:  Home/Self Care  In-House Referral:     Discharge planning Services  CM Consult  Post Acute Care Choice:    Choice offered to:     DME Arranged:    DME Agency:     HH Arranged:    HH Agency:     Status of Service:  In process, will continue to follow  If discussed at Long Length of Stay Meetings, dates discussed:    Additional Comments:  Charvi Gammage, Chauncey Reading, RN 03/25/2018, 3:31 PM

## 2018-03-25 NOTE — Progress Notes (Signed)
Provider Olevia Bowens made aware via AMION of the rise in the patient's troponin level. -from 0.03 to 0.19

## 2018-03-25 NOTE — Progress Notes (Signed)
*  PRELIMINARY RESULTS* Echocardiogram 2D Echocardiogram has been performed.  [name] 03/25/2018, 12:05 PM

## 2018-03-25 NOTE — Progress Notes (Signed)
ANTICOAGULATION CONSULT NOTE - Initial Consult  Pharmacy Consult for warfarin Indication: atrial fibrillation  Patient Measurements: Height: 5\' 7"  (170.2 cm) Weight: 202 lb 2.6 oz (91.7 kg) IBW/kg (Calculated) : 61.6   Vital Signs: Temp: 98.1 F (36.7 C) (06/06 0800) Temp Source: Oral (06/06 0800) BP: 106/57 (06/06 1030) Pulse Rate: 102 (06/06 1030)  Labs: Recent Labs    03/23/18 1006 03/24/18 1751 03/24/18 2354 03/25/18 0531  HGB  --  9.7*  --  9.6*  HCT  --  32.7*  --  33.3*  PLT  --  220  --  235  LABPROT  --   --   --  20.4*  INR 1.9*  --   --  1.76  CREATININE  --  1.11*  --  1.26*  TROPONINI  --  0.03* 0.19* 0.14*    Estimated Creatinine Clearance: 51 mL/min (A) (by C-G formula based on SCr of 1.26 mg/dL (H)).    Assessment: INR 1.76 today Pharmacy consulted to dose warfarin for patient with atrial fibrillation.  Goal of Therapy:  INR 2-3 Monitor platelets by anticoagulation protocol: Yes   Plan: Warfarin 3 mg x 1 dose today. Monitor daily labs/INR and s/s of bleeding  [name] 03/25/2018,11:40 AM

## 2018-03-25 NOTE — Progress Notes (Signed)
PROGRESS NOTE    [name]  VVO:[id number] DOB: Oct 06, 1951 DOA: 03/24/2018 PCP: Octavio Graves, DO   Brief Narrative:   [name] is a 67 y.o. 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  She has apparently not been wearing her home oxygen routinely and was admitted with acute diastolic CHF as well as atrial fibrillation with RVR and acute on chronic respiratory failure with hypercapnia.  Assessment & Plan:   Principal Problem:   Acute on chronic respiratory failure with hypercapnia (HCC) Active Problems:   Hyperlipidemia   Coronary atherosclerosis of native coronary artery   Anemia   Hypomagnesemia   Hypokalemia   Hypophosphatemia   Atrial fibrillation with RVR (HCC)   Elevated troponin   1. Acute on chronic respiratory failure with hypercapnia.  Continue current oxygen supplementation at 6 L and wean as tolerated.  We will continue to monitor in stepdown setting as she is acutely ill.  Will order Xopenex/ipratropium nebulizers every 6 hours instead of as needed and will place on Pulmicort twice daily. 2. Atrial fibrillation with RVR.  Continue IV Cardizem in the acute setting.  Appreciate cardiology evaluation.  Continue metoprolol for rate control and pharmacy for warfarin anticoagulation. 3. Elevated troponin.  Likely secondary to demand ischemia with no chest pain noted.  2D echocardiogram pending. 4. Dyslipidemia.  Continue Crestor. 5. CAD.  Continue beta-blocker, statin, warfarin.  No chest pain currently noted. 6. Anemia.  Continue to monitor with  no overt bleeding currently noted especially while on anticoagulation.   DVT prophylaxis: On warfarin Code Status: Full code Family Communication: None at bedside Disposition Plan: Continue to work on improving cardiopulmonary status with ongoing diuresis and rate control as well as treatment for acute bronchospasms.   Consultants:   Cardiology  Pulmonology  Procedures:   None  Antimicrobials:   None   Subjective: Patient seen and evaluated today with ongoing dyspnea noted which appears to have minimally improved since admission.  She continues to have poor heart rate control and remains on diltiazem IV.  Objective: Vitals:   03/25/18 0945 03/25/18 1000 03/25/18 1015 03/25/18 1030  BP:   111/84 (!) 106/57  Pulse: (!) 103 (!) 110 (!) 103 (!) 102  Resp: 20 (!) 25 (!) 26 (!) 27  Temp:      TempSrc:      SpO2: (!) 84% (!) 88% (!) 89% 92%  Weight:      Height:        Intake/Output Summary (Last 24 hours) at 03/25/2018 1208 Last data filed at 03/25/2018 0800 Gross per 24 hour  Intake 217.79 ml  Output -  Net 217.79 ml   Filed Weights   03/25/18 0049 03/25/18 0445  Weight: 91 kg (200 lb 9.9 oz) 91.7 kg (202 lb 2.6 oz)    Examination:  General exam: Appears calm and comfortable  Respiratory system: Wheezing noted at bases Cardiovascular system: S1 & S2 heard, irregular/tachycardic. No JVD, murmurs, rubs, gallops or clicks. No pedal edema. Gastrointestinal system: Abdomen is nondistended, soft and nontender. No organomegaly or masses felt. Normal bowel sounds heard. Central nervous system: Alert and oriented. No  focal neurological deficits. Extremities: Symmetric 5 x 5 power. Skin: No rashes, lesions or ulcers Psychiatry: Judgement and insight appear normal. Mood & affect appropriate.     Data Reviewed: I have personally reviewed following labs and imaging studies  CBC: Recent Labs  Lab 03/24/18 1751 03/25/18 0531  WBC 9.1 9.0  NEUTROABS 7.2 5.7  HGB 9.7*  9.6*  HCT 32.7* 33.3*  MCV 93.2 94.3  PLT 220 350   Basic Metabolic Panel: Recent Labs  Lab 03/24/18 1751 03/25/18 0531  NA 141 140  K 3.2* 3.9  CL 99* 98*  CO2 33* 34*  GLUCOSE 113* 103*  BUN 11 12  CREATININE 1.11* 1.26*  CALCIUM 8.9 8.5*  MG 1.6*  --   PHOS 2.4*  --    GFR: Estimated Creatinine Clearance: 51 mL/min (A) (by C-G formula based on SCr of 1.26 mg/dL (H)). Liver Function Tests: Recent Labs  Lab 03/25/18 0531  AST 30  ALT 17  ALKPHOS 76  BILITOT 1.3*  PROT 7.2  ALBUMIN 3.5   No results for input(s): LIPASE, AMYLASE in the last 168 hours. No results for input(s): AMMONIA in the last 168 hours. Coagulation Profile: Recent Labs  Lab 03/23/18 1006 03/25/18 0531  INR 1.9* 1.76   Cardiac Enzymes: Recent Labs  Lab 03/24/18 1751 03/24/18 2354 03/25/18 0531  TROPONINI 0.03* 0.19* 0.14*   BNP (last 3 results) No results for input(s): PROBNP in the last 8760 hours. HbA1C: No results for input(s): HGBA1C in the last 72 hours. CBG: No results for input(s): GLUCAP in the last 168 hours. Lipid Profile: No results for input(s): CHOL, HDL, LDLCALC, TRIG, CHOLHDL, LDLDIRECT in the last 72 hours. Thyroid Function Tests: No results for input(s): TSH, T4TOTAL, FREET4, T3FREE, THYROIDAB in the last 72 hours. Anemia Panel: No results for input(s): VITAMINB12, FOLATE, FERRITIN, TIBC, IRON, RETICCTPCT in the last 72 hours. Sepsis Labs: No results for input(s): PROCALCITON, LATICACIDVEN in the last 168 hours.  Recent Results (from the past 240 hour(s))  MRSA PCR Screening     Status: None   Collection Time: 03/25/18 12:38 AM  Result Value Ref Range Status   MRSA by PCR NEGATIVE NEGATIVE Final    Comment:        The GeneXpert MRSA Assay (FDA approved for NASAL specimens only), is one component of a comprehensive MRSA colonization surveillance program. It is not intended to diagnose MRSA infection nor to guide or monitor treatment for MRSA  infections. Performed at [facility], [address] [place],  [address]          Radiology Studies: Dg Chest [facility] 1 View  Result Date: 03/24/2018 CLINICAL DATA:  Mild to CIS, shortness of breath, distress EXAM: PORTABLE CHEST 1 VIEW COMPARISON:  11/04/2017 FINDINGS: Prior CABG and valve replacement. Cardiomegaly with vascular congestion and diffuse interstitial opacities throughout the lungs, likely interstitial edema. No effusions or acute bony abnormality. IMPRESSION: Mild interstitial edema/CHF. Electronically Signed   By: Rolm Baptise M.D.   On: 03/24/2018 18:19        Scheduled Meds: . allopurinol  100 mg Oral Daily  . ALPRAZolam  1 mg Oral q morning - 10a  . calcium carbonate  1 tablet Oral BID WC  . furosemide  40 mg Oral BID  . HYDROcodone-acetaminophen  1 tablet Oral QHS  . levothyroxine  50 mcg Oral QAC breakfast  . loratadine  10 mg Oral Daily  . magnesium oxide  400 mg Oral QHS  . metoprolol succinate  50 mg Oral Daily  . multivitamin with minerals  1 tablet Oral Daily  . pantoprazole  40 mg Oral Daily  . phosphorus  500 mg Oral TID WC & HS  . potassium chloride  10 mEq Oral TID  . rOPINIRole  3 mg Oral BID  . rosuvastatin  10 mg Oral QHS  . warfarin  3 mg Oral Once  . Warfarin - Pharmacist Dosing Inpatient   Does not apply q1800   Continuous Infusions: . diltiazem (CARDIZEM) infusion 10 mg/hr (03/25/18 0445)     LOS: 1 day    Time spent: 30 minutes    Pratik Darleen Crocker, DO [facility] Pager [phone]  If 7PM-7AM, please contact night-coverage [email or web address] Password [email or web address] 03/25/2018, 12:08 PM

## 2018-03-26 ENCOUNTER — Encounter (HOSPITAL_COMMUNITY): Payer: Self-pay | Admitting: Radiology

## 2018-03-26 ENCOUNTER — Inpatient Hospital Stay (HOSPITAL_COMMUNITY): Payer: Medicare HMO

## 2018-03-26 LAB — BASIC METABOLIC PANEL
Anion gap: 8 (ref 5–15)
BUN: 18 mg/dL (ref 6–20)
CHLORIDE: 93 mmol/L — AB (ref 101–111)
CO2: 35 mmol/L — AB (ref 22–32)
Calcium: 8.6 mg/dL — ABNORMAL LOW (ref 8.9–10.3)
Creatinine, Ser: 1.59 mg/dL — ABNORMAL HIGH (ref 0.44–1.00)
GFR calc Af Amer: 38 mL/min — ABNORMAL LOW (ref >60)
GFR calc non Af Amer: 33 mL/min — ABNORMAL LOW (ref >60)
Glucose, Bld: 113 mg/dL — ABNORMAL HIGH (ref 65–99)
POTASSIUM: 4.9 mmol/L (ref 3.5–5.1)
SODIUM: 136 mmol/L (ref 135–145)

## 2018-03-26 LAB — BLOOD GAS, ARTERIAL
Acid-Base Excess: 6.1 mmol/L — ABNORMAL HIGH (ref 0.0–2.0)
Bicarbonate: 28.1 mmol/L — ABNORMAL HIGH (ref 20.0–28.0)
Drawn by: 27733
O2 CONTENT: 8 L/min
O2 Saturation: 86 %
PATIENT TEMPERATURE: 37
pCO2 arterial: 76.6 mmHg (ref 32.0–48.0)
pH, Arterial: 7.256 — ABNORMAL LOW (ref 7.350–7.450)
pO2, Arterial: 52.4 mmHg — ABNORMAL LOW (ref 83.0–108.0)

## 2018-03-26 LAB — PROTIME-INR
INR: 2.13
PROTHROMBIN TIME: 23.6 s — AB (ref 11.4–15.2)

## 2018-03-26 LAB — CBC
HCT: 36.7 % (ref 36.0–46.0)
HEMOGLOBIN: 10.3 g/dL — AB (ref 12.0–15.0)
MCH: 27.3 pg (ref 26.0–34.0)
MCHC: 28.1 g/dL — ABNORMAL LOW (ref 30.0–36.0)
MCV: 97.3 fL (ref 78.0–100.0)
Platelets: 269 10*3/uL (ref 150–400)
RBC: 3.77 MIL/uL — AB (ref 3.87–5.11)
RDW: 16.3 % — ABNORMAL HIGH (ref 11.5–15.5)
WBC: 12 10*3/uL — ABNORMAL HIGH (ref 4.0–10.5)

## 2018-03-26 MED ORDER — METHYLPREDNISOLONE SODIUM SUCC 40 MG IJ SOLR
40.0000 mg | Freq: Four times a day (QID) | INTRAMUSCULAR | Status: DC
Start: 2018-03-26 — End: 2018-03-28
  Administered 2018-03-26 – 2018-03-28 (×8): 40 mg via INTRAVENOUS
  Filled 2018-03-26 (×8): qty 1

## 2018-03-26 MED ORDER — BISOPROLOL FUMARATE 5 MG PO TABS
5.0000 mg | ORAL_TABLET | Freq: Every day | ORAL | Status: DC
  Administered 2018-03-26 – 2018-03-31 (×6): 5 mg via ORAL
  Filled 2018-03-26 (×6): qty 1

## 2018-03-26 MED ORDER — WARFARIN SODIUM 1 MG PO TABS
3.0000 mg | ORAL_TABLET | Freq: Once | ORAL | Status: AC
Start: 2018-03-26 — End: 2018-03-26
  Administered 2018-03-26: 3 mg via ORAL
  Filled 2018-03-26: qty 1

## 2018-03-26 MED ORDER — FUROSEMIDE 10 MG/ML IJ SOLN
60.0000 mg | Freq: Two times a day (BID) | INTRAMUSCULAR | Status: DC
  Administered 2018-03-26 – 2018-03-31 (×11): 60 mg via INTRAVENOUS
  Filled 2018-03-26 (×12): qty 6

## 2018-03-26 NOTE — Progress Notes (Signed)
**Note De-Identified  Obfuscation** Patient removed from BIPAP and placed on 10 L HFNC..  RT to continue to monitor.

## 2018-03-26 NOTE — Progress Notes (Addendum)
Progress Note  Patient Name: [name] Date of Encounter: 03/26/2018  Primary Cardiologist: Dr. Satira Sark  Subjective   Somewhat more comfortable today but still short of breath with intermittent coughing.  No chest pain or palpitations.  Inpatient Medications    Scheduled Meds: . allopurinol  100 mg Oral Daily  . ALPRAZolam  1 mg Oral q morning - 10a  . budesonide (PULMICORT) nebulizer solution  0.25 mg Nebulization BID  . calcium carbonate  1 tablet Oral BID WC  . furosemide  40 mg Oral BID  . HYDROcodone-acetaminophen  1 tablet Oral QHS  . ipratropium  0.5 mg Nebulization TID  . levalbuterol  1.25 mg Nebulization TID  . levothyroxine  50 mcg Oral QAC breakfast  . loratadine  10 mg Oral Daily  . magnesium oxide  400 mg Oral QHS  . methylPREDNISolone (SOLU-MEDROL) injection  40 mg Intravenous Q6H  . metoprolol succinate  50 mg Oral Daily  . multivitamin with minerals  1 tablet Oral Daily  . pantoprazole  40 mg Oral Daily  . potassium chloride  10 mEq Oral TID  . rOPINIRole  3 mg Oral BID  . rosuvastatin  10 mg Oral QHS  . warfarin  3 mg Oral Once  . Warfarin - Pharmacist Dosing Inpatient   Does not apply q1800   Continuous Infusions: . diltiazem (CARDIZEM) infusion 10 mg/hr (03/26/18 0556)   PRN Meds: acetaminophen **OR** acetaminophen, benzonatate, cyclobenzaprine, levalbuterol, morphine injection, ondansetron **OR** ondansetron (ZOFRAN) IV, temazepam   Vital Signs    Vitals:   03/26/18 0730 03/26/18 0745 03/26/18 0800 03/26/18 0836  BP: 111/67 120/71 123/70   Pulse: (!) 127 (!) 108 (!) 133   Resp: (!) 23 (!) 27 (!) 28   Temp:    100 F (37.8 C)  TempSrc:    Oral  SpO2: 100% 94% 97%   Weight:      Height:        Intake/Output Summary (Last 24 hours) at 03/26/2018 0908 Last data filed at 03/26/2018 0800 Gross per 24 hour  Intake 170 ml  Output 325 ml  Net -155 ml   Filed Weights   03/25/18 0049 03/25/18 0445 03/26/18 0500  Weight: 200 lb  9.9 oz (91 kg) 202 lb 2.6 oz (91.7 kg) 205 lb 7.5 oz (93.2 kg)    Telemetry    Atrial fibrillation with RVR.  Personally reviewed.  Physical Exam   GEN: No acute distress.   Neck:  Elevated JVP. Cardiac:  Irregularly irregular, no gallop, mechanical click in S2 with soft systolic murmur.  Respiratory:  Prolonged expiratory phase. GI: Soft, nontender, bowel sounds present. MS:  Mild ankle edema and venous stasis; No deformity. Neuro:  Nonfocal. Psych: Alert and oriented x 3. Normal affect.  Labs    Chemistry Recent Labs  Lab 03/24/18 1751 03/25/18 0531 03/26/18 0511  NA 141 140 136  K 3.2* 3.9 4.9  CL 99* 98* 93*  CO2 33* 34* 35*  GLUCOSE 113* 103* 113*  BUN 11 12 18   CREATININE 1.11* 1.26* 1.59*  CALCIUM 8.9 8.5* 8.6*  PROT  --  7.2  --   ALBUMIN  --  3.5  --   AST  --  30  --   ALT  --  17  --   ALKPHOS  --  76  --   BILITOT  --  1.3*  --   GFRNONAA 51* 43* 33*  GFRAA 59* 50* 38*  ANIONGAP 9  8 8     Hematology Recent Labs  Lab 03/24/18 1751 03/25/18 0531 03/26/18 0511  WBC 9.1 9.0 12.0*  RBC 3.51* 3.53* 3.77*  HGB 9.7* 9.6* 10.3*  HCT 32.7* 33.3* 36.7  MCV 93.2 94.3 97.3  MCH 27.6 27.2 27.3  MCHC 29.7* 28.8* 28.1*  RDW 15.5 15.8* 16.3*  PLT 220 235 269    Cardiac Enzymes Recent Labs  Lab 03/24/18 1751 03/24/18 2354 03/25/18 0531  TROPONINI 0.03* 0.19* 0.14*   No results for input(s): TROPIPOC in the last 168 hours.   BNP Recent Labs  Lab 03/24/18 1754  BNP 336.0*     Radiology    Dg Chest Port 1 View  Result Date: 03/24/2018 CLINICAL DATA:  Mild to CIS, shortness of breath, distress EXAM: PORTABLE CHEST 1 VIEW COMPARISON:  11/04/2017 FINDINGS: Prior CABG and valve replacement. Cardiomegaly with vascular congestion and diffuse interstitial opacities throughout the lungs, likely interstitial edema. No effusions or acute bony abnormality. IMPRESSION: Mild interstitial edema/CHF. Electronically Signed   By: Rolm Baptise M.D.   On:  03/24/2018 18:19    Cardiac Studies   Echocardiogram 03/25/2018: Study Conclusions  - Left ventricle: The cavity size was normal. Wall thickness was   increased in a pattern of mild LVH. Systolic function was   vigorous. The estimated ejection fraction was in the range of 65%   to 70%. Wall motion was normal; there were no regional wall   motion abnormalities. The study was not technically sufficient to   allow evaluation of LV diastolic dysfunction due to atrial   fibrillation. - Aortic valve: A [employer] mechanical prosthesis was   present. There was trivial regurgitation. Mean gradient (S): 12   mm Hg. Valve area (VTI): 1.42 cm^2. - Mitral valve: Moderately calcified annulus. There was mild   regurgitation. - Left atrium: The atrium was at the upper limits of normal in   size. - Right atrium: Central venous pressure (est): 15 mm Hg. - Tricuspid valve: There was mild regurgitation. - Pulmonary arteries: PA peak pressure: 44 mm Hg (S). - Pericardium, extracardiac: There was no pericardial effusion.  Patient Profile     67 y.o. female with a history of chronic diastolic CHF, CAD, aortic stenosis with history of 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  Assessment & Plan    1.  Permanent atrial fibrillation, continues with RVR and on IV Cardizem in addition to Toprol-XL which she takes as an outpatient.  On Coumadin per pharmacy.  2.  Elevated troponin I with low level elevation of 0.03, 0.19, and 0.14 most consistent with demand ischemia rather than ACS.  3.  History of [employer] mechanical AVR.  Follow-up echocardiogram shows appropriate transvalvular gradient and  trivial aortic regurgitation.  4.  CAD status post CABG in 2007.  No angina symptoms at this time.  LVEF 65 to 70%.  5.  Acute on chronic diastolic heart failure.  Weight is up and she has not diuresed much yet on IV Lasix.  Dr. Luan Pulling is now following the patient's pulmonary status.  Continue IV Cardizem for now.  I will also switch her from Toprol-XL to bisoprolol (more beta 1 selective) this may help to further stabilize pulmonary status as well.  Change to IV Lasix 60 mg BID, consider placing Foley catheter to better document urine output and address retention (discussed with nursing).  Signed, Rozann Lesches, MD  03/26/2018, 9:08 AM

## 2018-03-26 NOTE — Progress Notes (Signed)
ANTICOAGULATION CONSULT NOTE - follow up [place] for warfarin Indication: atrial fibrillation  Patient Measurements: Height: 5\' 7"  (170.2 cm) Weight: 205 lb 7.5 oz (93.2 kg) IBW/kg (Calculated) : 61.6   Vital Signs: Temp: 99.6 F (37.6 C) (06/07 0400) Temp Source: Axillary (06/07 0400) BP: 123/70 (06/07 0800) Pulse Rate: 133 (06/07 0800)  Labs: Recent Labs    03/23/18 1006  03/24/18 1751 03/24/18 2354 03/25/18 0531 03/26/18 0511  HGB  --    < > 9.7*  --  9.6* 10.3*  HCT  --   --  32.7*  --  33.3* 36.7  PLT  --   --  220  --  235 269  LABPROT  --   --   --   --  20.4* 23.6*  INR 1.9*  --   --   --  1.76 2.13  CREATININE  --   --  1.11*  --  1.26* 1.59*  TROPONINI  --   --  0.03* 0.19* 0.14*  --    < > = values in this interval not displayed.    Estimated Creatinine Clearance: 40.8 mL/min (A) (by C-G formula based on SCr of 1.59 mg/dL (H)).    Assessment: INR 1.76 today Pharmacy consulted to dose warfarin for patient with atrial fibrillation. INR is therapeutic .  Home dose is 2mg  on Tues, Thurs, Sat, Sunday; 3mg  on MWF Goal of Therapy:  INR 2-3 Monitor platelets by anticoagulation protocol: Yes   Plan: Warfarin 3 mg x 1 dose today. Daily PT-INR Monitor for s/s of bleeding  Isac Sarna, BS Vena [place], BCPS Clinical Pharmacist Pager [phone] 03/26/2018,8:30 AM

## 2018-03-26 NOTE — Progress Notes (Signed)
PROGRESS NOTE    [name]  WVP:[id number] DOB: 1951-02-01 DOA: 03/24/2018 PCP: Octavio Graves, DO   Brief Narrative:   [name] is a 67 y.o. 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  She has apparently not been wearing her home oxygen routinely and was admitted with acute diastolic CHF as well as atrial fibrillation with RVR and acute on chronic respiratory failure with hypercapnia.  Assessment & Plan:   Principal Problem:   Acute on chronic respiratory failure with hypercapnia (HCC) Active Problems:   Hyperlipidemia   Coronary atherosclerosis of native coronary artery   Anemia   Hypomagnesemia   Hypokalemia   Hypophosphatemia   Atrial fibrillation with RVR (HCC)   Elevated troponin   1. Acute on chronic respiratory failure with hypercapnia.  Continue nebulizer treatments and Pulmicort.  Appreciate pulmonology recommendations with initiation of IV steroids.  Repeat ABG demonstrates worsening hypercapnia for which I have ordered BiPAP as well as repeat ABG shortly thereafter.  Repeat chest x-ray pending. 2. Atrial fibrillation with RVR.  Continue IV Cardizem in the acute setting.  Appreciate cardiology evaluation.  Metoprolol changed to bisoprolol for beta-1 selectivity.  Additionally Lasix dose has been increased to 60 mg IV twice daily as she has not had much diuresis.  Monitor repeat renal panel in AM.  3. Elevated troponin.  Likely secondary to demand ischemia with no chest pain noted.  2D echocardiogram with no acute findings. 4. Dyslipidemia.   Continue Crestor. 5. CAD.  Continue beta-blocker, statin, warfarin.  No chest pain currently noted. 6. Anemia.  Continue to monitor with no overt bleeding currently noted especially while on anticoagulation.   DVT prophylaxis: On warfarin Code Status: Full code Family Communication: None at bedside Disposition Plan: Continue to work on improving cardiopulmonary status with ongoing diuresis and rate control as well as treatment for acute bronchospasms.   Consultants:   Cardiology  Pulmonology  Procedures:   None  Antimicrobials:   None   Subjective: Patient seen and evaluated today with some ongoing dyspnea and somnolence this morning.  No acute overnight events noted.  Objective: Vitals:   03/26/18 1015 03/26/18 1030 03/26/18 1045 03/26/18 1100  BP: 112/72 114/79 106/68 105/68  Pulse: (!) 111 (!) 123 (!) 103 95  Resp: (!) 27 (!) 23 (!) 30 (!) 25  Temp:      TempSrc:      SpO2: 93% 93% 91% 91%  Weight:      Height:        Intake/Output Summary (Last 24 hours) at 03/26/2018 1113 Last data filed at 03/26/2018 0900 Gross per 24 hour  Intake 180 ml  Output 325 ml  Net -145 ml   Filed Weights   03/25/18 0049 03/25/18 0445 03/26/18 0500  Weight: 91 kg (200 lb 9.9 oz) 91.7 kg (202 lb 2.6 oz) 93.2 kg (205 lb 7.5 oz)    Examination:  General exam: Appears calm and comfortable; somnolent Respiratory system: Wheezing noted at bases Cardiovascular system: S1 & S2 heard, irregular/tachycardic. No JVD, murmurs, rubs, gallops or clicks. No pedal edema. Gastrointestinal system: Abdomen is nondistended, soft and nontender. No  organomegaly or masses felt. Normal bowel sounds heard. Central nervous system: Alert and oriented. No focal neurological deficits. Extremities: Symmetric 5 x 5 power. Skin: No rashes, lesions or ulcers    Data Reviewed: I have personally reviewed following labs and imaging studies  CBC: Recent Labs  Lab 03/24/18 1751 03/25/18 0531  03/26/18 0511  WBC 9.1 9.0 12.0*  NEUTROABS 7.2 5.7  --   HGB 9.7* 9.6* 10.3*  HCT 32.7* 33.3* 36.7  MCV 93.2 94.3 97.3  PLT 220 235 355   Basic Metabolic Panel: Recent Labs  Lab 03/24/18 1751 03/25/18 0531 03/26/18 0511  NA 141 140 136  K 3.2* 3.9 4.9  CL 99* 98* 93*  CO2 33* 34* 35*  GLUCOSE 113* 103* 113*  BUN 11 12 18   CREATININE 1.11* 1.26* 1.59*  CALCIUM 8.9 8.5* 8.6*  MG 1.6*  --   --   PHOS 2.4*  --   --    GFR: Estimated Creatinine Clearance: 40.8 mL/min (A) (by C-G formula based on SCr of 1.59 mg/dL (H)). Liver Function Tests: Recent Labs  Lab 03/25/18 0531  AST 30  ALT 17  ALKPHOS 76  BILITOT 1.3*  PROT 7.2  ALBUMIN 3.5   No results for input(s): LIPASE, AMYLASE in the last 168 hours. No results for input(s): AMMONIA in the last 168 hours. Coagulation Profile: Recent Labs  Lab 03/23/18 1006 03/25/18 0531 03/26/18 0511  INR 1.9* 1.76 2.13   Cardiac Enzymes: Recent Labs  Lab 03/24/18 1751 03/24/18 2354 03/25/18 0531  TROPONINI 0.03* 0.19* 0.14*   BNP (last 3 results) No results for input(s): PROBNP in the last 8760 hours. HbA1C: No results for input(s): HGBA1C in the last 72 hours. CBG: No results for input(s): GLUCAP in the last 168 hours. Lipid Profile: No results for input(s): CHOL, HDL, LDLCALC, TRIG, CHOLHDL, LDLDIRECT in the last 72 hours. Thyroid Function Tests: No results for input(s): TSH, T4TOTAL, FREET4, T3FREE, THYROIDAB in the last 72 hours. Anemia Panel: No results for input(s): VITAMINB12, FOLATE, FERRITIN, TIBC, IRON, RETICCTPCT in the last 72 hours. Sepsis Labs: No results for input(s): PROCALCITON, LATICACIDVEN in the last 168 hours.  Recent Results (from the past 240 hour(s))  MRSA PCR Screening     Status: None   Collection Time: 03/25/18 12:38 AM  Result Value Ref Range Status   MRSA by PCR NEGATIVE NEGATIVE Final    Comment:        The GeneXpert MRSA Assay (FDA approved for NASAL specimens only), is one  component of a comprehensive MRSA colonization surveillance program. It is not intended to diagnose MRSA infection nor to guide or monitor treatment for MRSA infections. Performed at [facility], [address] [place], [place] [address]          Radiology Studies: Dg Chest [facility] 1 View  Result Date: 03/24/2018 CLINICAL DATA:  Mild to CIS, shortness of breath, distress EXAM: PORTABLE CHEST 1 VIEW COMPARISON:  11/04/2017 FINDINGS: Prior CABG and valve replacement. Cardiomegaly with vascular congestion and diffuse interstitial opacities throughout the lungs, likely interstitial edema. No effusions or acute bony abnormality. IMPRESSION: Mild interstitial edema/CHF. Electronically Signed   By: Rolm Baptise M.D.   On: 03/24/2018 18:19        Scheduled Meds: . allopurinol  100 mg Oral Daily  . ALPRAZolam  1 mg Oral q morning - 10a  . bisoprolol  5 mg Oral Daily  . budesonide (PULMICORT) nebulizer solution  0.25 mg Nebulization BID  . calcium carbonate  1 tablet Oral BID WC  . furosemide  60 mg Intravenous BID  . HYDROcodone-acetaminophen  1 tablet Oral QHS  . ipratropium  0.5 mg Nebulization TID  . levalbuterol  1.25 mg Nebulization TID  . levothyroxine  50 mcg Oral QAC breakfast  . loratadine  10 mg Oral Daily  . magnesium oxide  400 mg Oral QHS  . methylPREDNISolone (SOLU-MEDROL) injection  40 mg Intravenous Q6H  . multivitamin with minerals  1 tablet Oral Daily  . pantoprazole  40 mg Oral Daily  . potassium chloride  10 mEq Oral TID  . rOPINIRole  3 mg Oral BID  . rosuvastatin  10 mg Oral QHS  . warfarin  3 mg Oral Once  . Warfarin - Pharmacist Dosing Inpatient   Does not apply q1800   Continuous Infusions: . diltiazem (CARDIZEM) infusion 10 mg/hr (03/26/18 0556)     LOS: 2 days    Time spent: 30 minutes    Fathima Bartl Darleen Crocker, DO [facility] Pager [phone]  If 7PM-7AM, please contact night-coverage [email or web address] Password [email or web address] 03/26/2018, 11:13 AM

## 2018-03-26 NOTE — Consult Note (Signed)
Consult requested by: [facility] hospitalist, Dr. Manuella Ghazi Consult requested for: Respiratory failure, COPD exacerbation  HPI: This is a 67 year old who I had seen twice in my office last month with COPD exacerbation.  She had been having trouble for several months with cough and congestion shortness of breath and wheezing.  She was treated and improved.  She came to the emergency department on 5 June with increased shortness of breath and she was coughing up blood.  She has multiple other medical problems including chronic atrial fib, coronary disease status post bypass grafting, aortic stenosis with Saint Jude mechanical AVR in 2007 at the time of her bypass grafting anxiety acute on chronic diastolic heart failure sleep apnea intolerant of CPAP GERD and significant trouble with anxiety.  She was started on BiPAP in the emergency department but did not tolerate it and asked that it be removed.  She is been on high flow nasal cannula since.  She is supposed to be on oxygen at home but generally does not wear it.  She had more trouble with being anxious last night received medication for that and then she received something for pain and this morning she is somnolent.  History is from the medical record because she is unable to provide any history now.  Past Medical History:  Diagnosis Date  . Acute on chronic diastolic (congestive) heart failure ([place])   . Anxiety disorder   . Aortic stenosis    Status post St. Jude mechanical AVR 2007  . Asthma   . Atrial fibrillation ([place])   . Carcinoid tumor of colon 2007  . Coronary atherosclerosis of native coronary artery    Status post CABG 2007  . GERD (gastroesophageal reflux disease)   . History of colonoscopy 2003   Dr. Gala Romney - normal  . Hyperlipidemia   . Macromastia   . Pedal edema    Bilateral, chronic  . Peptic stricture of esophagus 10/04/2010   GE junction on last EGD by Dr. Gala Romney, benign biopsies  . RLS (restless legs syndrome)   . Schatzki's ring       Family History  Problem Relation Age of Onset  . Stroke Mother   . Cancer Father   . Anesthesia problems Neg Hx   . Hypotension Neg Hx   . Malignant hyperthermia Neg Hx   . Pseudochol deficiency Neg Hx      Social History   Socioeconomic History  . Marital status: Married    Spouse name: Not on file  . Number of children: Not on file  . Years of education: Not on file  . Highest education level: Not on file  Occupational History  . Not on file  Social Needs  . Financial resource strain: Not on file  . Food insecurity:    Worry: Not on file    Inability: Not on file  . Transportation needs:    Medical: Not on file    Non-medical: Not on file  Tobacco Use  . Smoking status: Former Smoker    Packs/day: 1.00    Years: 20.00    Pack years: 20.00    Types: Cigarettes    Last attempt to quit: 10/20/1988    Years since quitting: 29.4  . Smokeless tobacco: Never Used  Substance and Sexual Activity  . Alcohol use: No    Alcohol/week: 0.0 oz  . Drug use: No  . Sexual activity: Yes    Partners: Male    Birth control/protection: None    Comment: spouse  Lifestyle  . Physical activity:    Days per week: Not on file    Minutes per session: Not on file  . Stress: Not on file  Relationships  . Social connections:    Talks on phone: Not on file    Gets together: Not on file    Attends religious service: Not on file    Active member of club or organization: Not on file    Attends meetings of clubs or organizations: Not on file    Relationship status: Not on file  Other Topics Concern  . Not on file  Social History Narrative  . Not on file     ROS: Unable to obtain    Objective: Vital signs in last 24 hours: Temp:  [98.7 F (37.1 C)-100.7 F (38.2 C)] 99.6 F (37.6 C) (06/07 0400) Pulse Rate:  [28-130] 116 (06/07 0615) Resp:  [20-33] 24 (06/07 0615) BP: (88-127)/(52-91) 108/69 (06/07 0615) SpO2:  [78 %-100 %] 100 % (06/07 0615) Weight:  [93.2 kg (205 lb  7.5 oz)] 93.2 kg (205 lb 7.5 oz) (06/07 0500) Weight change: 2.2 kg (4 lb 13.6 oz)    Intake/Output from previous day: 06/06 0701 - 06/07 0700 In: 172.8 [I.V.:172.8] Out: 325 [Urine:325]  PHYSICAL EXAM Constitutional: She is sleeping.  She is obese.  She is difficult to arouse.  She has some dark material in the corner of her mouth on the right that looks like it may be gastric in origin.  Eyes: Pupils react ears nose mouth and throat: She has the material on her mouth.  Neck is supple.  Cardiovascular: She has atrial fib and has prosthetic heart valve sounds.  Respiratory: Respiratory effort is normal.  She has bilateral rhonchi and end expiratory wheezing.  Gastrointestinal: Her abdomen is soft without masses.  Extremities: I cannot assess her strength now.  Neurological: Cannot be assessed now.  Psychiatric: Cannot be assessed at this point.  Lab Results: Basic Metabolic Panel: Recent Labs    03/24/18 1751 03/25/18 0531 03/26/18 0511  NA 141 140 136  K 3.2* 3.9 4.9  CL 99* 98* 93*  CO2 33* 34* 35*  GLUCOSE 113* 103* 113*  BUN 11 12 18   CREATININE 1.11* 1.26* 1.59*  CALCIUM 8.9 8.5* 8.6*  MG 1.6*  --   --   PHOS 2.4*  --   --    Liver Function Tests: Recent Labs    03/25/18 0531  AST 30  ALT 17  ALKPHOS 76  BILITOT 1.3*  PROT 7.2  ALBUMIN 3.5   No results for input(s): LIPASE, AMYLASE in the last 72 hours. No results for input(s): AMMONIA in the last 72 hours. CBC: Recent Labs    03/24/18 1751 03/25/18 0531 03/26/18 0511  WBC 9.1 9.0 12.0*  NEUTROABS 7.2 5.7  --   HGB 9.7* 9.6* 10.3*  HCT 32.7* 33.3* 36.7  MCV 93.2 94.3 97.3  PLT 220 235 269   Cardiac Enzymes: Recent Labs    03/24/18 1751 03/24/18 2354 03/25/18 0531  TROPONINI 0.03* 0.19* 0.14*   BNP: No results for input(s): PROBNP in the last 72 hours. D-Dimer: No results for input(s): DDIMER in the last 72 hours. CBG: No results for input(s): GLUCAP in the last 72 hours. Hemoglobin A1C: No  results for input(s): HGBA1C in the last 72 hours. Fasting Lipid Panel: No results for input(s): CHOL, HDL, LDLCALC, TRIG, CHOLHDL, LDLDIRECT in the last 72 hours. Thyroid Function Tests: No results for input(s): TSH, T4TOTAL, FREET4,  T3FREE, THYROIDAB in the last 72 hours. Anemia Panel: No results for input(s): VITAMINB12, FOLATE, FERRITIN, TIBC, IRON, RETICCTPCT in the last 72 hours. Coagulation: Recent Labs    03/25/18 0531 03/26/18 0511  LABPROT 20.4* 23.6*  INR 1.76 2.13   Urine Drug Screen: Drugs of Abuse  No results found for: LABOPIA, COCAINSCRNUR, LABBENZ, AMPHETMU, THCU, LABBARB  Alcohol Level: No results for input(s): ETH in the last 72 hours. Urinalysis: No results for input(s): COLORURINE, LABSPEC, PHURINE, GLUCOSEU, HGBUR, BILIRUBINUR, KETONESUR, PROTEINUR, UROBILINOGEN, NITRITE, LEUKOCYTESUR in the last 72 hours.  Invalid input(s): APPERANCEUR Misc. Labs:   ABGS: Recent Labs    03/24/18 1855  PHART 7.357  PO2ART 164*  HCO3 30.6*     MICROBIOLOGY: Recent Results (from the past 240 hour(s))  MRSA PCR Screening     Status: None   Collection Time: 03/25/18 12:38 AM  Result Value Ref Range Status   MRSA by PCR NEGATIVE NEGATIVE Final    Comment:        The GeneXpert MRSA Assay (FDA approved for NASAL specimens only), is one component of a comprehensive MRSA colonization surveillance program. It is not intended to diagnose MRSA infection nor to guide or monitor treatment for MRSA infections. Performed at [facility], [address], [place], [place] [address]     Studies/Results: Dg Chest Port 1 View  Result Date: 03/24/2018 CLINICAL DATA:  Mild to CIS, shortness of breath, distress EXAM: PORTABLE CHEST 1 VIEW COMPARISON:  11/04/2017 FINDINGS: Prior CABG and valve replacement. Cardiomegaly with vascular congestion and diffuse interstitial opacities throughout the lungs, likely interstitial edema. No effusions or acute bony abnormality. IMPRESSION:  Mild interstitial edema/CHF. Electronically Signed   By: Rolm Baptise M.D.   On: 03/24/2018 18:19    Medications:  Prior to Admission:  Medications Prior to Admission  Medication Sig Dispense Refill Last Dose  . albuterol (PROVENTIL HFA;VENTOLIN HFA) 108 (90 BASE) MCG/ACT inhaler Inhale 1-2 puffs into the lungs every 6 (six) hours as needed for wheezing or shortness of breath.    03/24/2018 at Unknown time  . allopurinol (ZYLOPRIM) 100 MG tablet Take 100 mg by mouth daily.   03/24/2018 at Unknown time  . ALPRAZolam (XANAX) 1 MG tablet Take 1 mg by mouth every morning. *May take one tablet two times daily as needed for anxiety*   03/24/2018 at Unknown time  . benzonatate (TESSALON) 200 MG capsule Take 1 capsule (200 mg total) by mouth 2 (two) times daily as needed for cough. (Patient taking differently: Take 200 mg by mouth 2 (two) times daily. *May take one capsule every 8 hours as needed for cough*) 20 capsule 0 03/24/2018 at Unknown time  . calcium carbonate (OS-CAL) 600 MG TABS tablet Take 600 mg by mouth 2 (two) times daily.   03/24/2018 at Unknown time  . cetirizine (ZYRTEC) 10 MG tablet Take 10 mg by mouth daily.     03/24/2018 at Unknown time  . clobetasol cream (TEMOVATE) 9.62 % Apply 1 application topically 2 (two) times daily as needed (FOR SKIN IRRITATION/REDNESS & BLISTERING ON LEGS).    unknown  . CRESTOR 10 MG tablet Take 10 mg by mouth at bedtime.    03/23/2018 at Unknown time  . cyclobenzaprine (FLEXERIL) 10 MG tablet Take 10 mg by mouth 3 (three) times daily as needed for muscle spasms.    unknown  . fluconazole (DIFLUCAN) 100 MG tablet Take 100 mg by mouth daily.  0 unknown  . furosemide (LASIX) 40 MG tablet Take  40 mg by mouth 2 (two) times daily.    03/24/2018 at Unknown time  . HYDROcodone-acetaminophen (NORCO) 10-325 MG per tablet Take 1 tablet by mouth at bedtime. *May take one tablet every 12 hours as needed for pain*   03/23/2018 at Unknown time  . HYDROcodone-homatropine (HYCODAN) 5-1.5  MG/5ML syrup Take 5 mLs by mouth 4 (four) times daily as needed for cough.   0 unknown  . levothyroxine (SYNTHROID, LEVOTHROID) 50 MCG tablet Take 50 mcg by mouth daily before breakfast.    03/24/2018 at Unknown time  . magnesium oxide (MAG-OX) 400 MG tablet Take 400 mg by mouth at bedtime.   03/24/2018 at Unknown time  . metoprolol succinate (TOPROL-XL) 50 MG 24 hr tablet Take 1 tablet (50 mg total) by mouth daily. 30 tablet 0 03/24/2018 at 700a  . mometasone (NASONEX) 50 MCG/ACT nasal spray Place 2 sprays into the nose at bedtime.  0 unknown  . Multiple Vitamin (MULTIVITAMIN WITH MINERALS) TABS tablet Take 1 tablet by mouth daily.   03/24/2018 at Unknown time  . omeprazole (PRILOSEC) 20 MG capsule Take 20 mg by mouth daily.   03/24/2018 at Unknown time  . OXYGEN Inhale 2 L into the lungs daily.    03/24/2018 at Unknown time  . potassium chloride (K-DUR) 10 MEQ tablet Take 1 tablet (10 mEq total) by mouth daily. 90 tablet 3 03/24/2018 at Unknown time  . rOPINIRole (REQUIP) 3 MG tablet Take 3 mg by mouth 2 (two) times daily.    03/24/2018 at Unknown time  . temazepam (RESTORIL) 15 MG capsule Take 15 mg by mouth at bedtime.    03/23/2018 at Unknown time  . warfarin (COUMADIN) 2 MG tablet Takes one tablet (2mg ) everyday (Patient taking differently: Take 2-3 mg by mouth See admin instructions. 2mg  on Tuesday,Thursday,Saturday,Sunda, and 3mg  on Monday, Wednesday, and Friday) 45 tablet 3 03/23/2018 at Unknown time  . enoxaparin (LOVENOX) 100 MG/ML injection Inject 1 mL (100 mg total) into the skin every 12 (twelve) hours. (Patient not taking: Reported on 03/24/2018) 20 Syringe 0 Not Taking at Unknown time   Scheduled: . allopurinol  100 mg Oral Daily  . ALPRAZolam  1 mg Oral q morning - 10a  . budesonide (PULMICORT) nebulizer solution  0.25 mg Nebulization BID  . calcium carbonate  1 tablet Oral BID WC  . furosemide  40 mg Oral BID  . HYDROcodone-acetaminophen  1 tablet Oral QHS  . ipratropium  0.5 mg Nebulization TID  .  levalbuterol  1.25 mg Nebulization TID  . levothyroxine  50 mcg Oral QAC breakfast  . loratadine  10 mg Oral Daily  . magnesium oxide  400 mg Oral QHS  . methylPREDNISolone (SOLU-MEDROL) injection  40 mg Intravenous Q6H  . metoprolol succinate  50 mg Oral Daily  . multivitamin with minerals  1 tablet Oral Daily  . pantoprazole  40 mg Oral Daily  . potassium chloride  10 mEq Oral TID  . rOPINIRole  3 mg Oral BID  . rosuvastatin  10 mg Oral QHS  . Warfarin - Pharmacist Dosing Inpatient   Does not apply q1800   Continuous: . diltiazem (CARDIZEM) infusion 10 mg/hr (03/26/18 0556)   [id number] **OR** acetaminophen, benzonatate, cyclobenzaprine, levalbuterol, morphine injection, ondansetron **OR** ondansetron (ZOFRAN) IV, temazepam  Assesment: She was admitted with acute on chronic hypercapneic and hypoxic respiratory failure.  Chest x-ray which I have personally reviewed looks like she is got mild pulmonary edema.  She has airflow obstruction at baseline and  had a recent prolonged exacerbation.  She is pretty somnolent this morning may be from medication but I am going to order a blood gas to make sure that she is not got CO2 narcosis  She has sleep apnea not using CPAP because she does not tolerate it  She has chronic atrial fib and currently has RVR.  She has elevated troponin but is not felt to have acute coronary syndrome  She has coronary disease status post bypass grafting but no chest pain.  She had hemoptysis that I think is from her pulmonary edema and chronic anticoagulation  Principal Problem:   Acute on chronic respiratory failure with hypercapnia ([facility]) Active Problems:   Hyperlipidemia   Coronary atherosclerosis of native coronary artery   Anemia   Hypomagnesemia   Hypokalemia   Hypophosphatemia   Atrial fibrillation with RVR ([facility])   Elevated troponin    Plan: Check ABG.  Check chest x-ray.  Add IV steroids.    LOS: 2 days   Harvin Konicek L 03/26/2018,  8:02 AM

## 2018-03-26 NOTE — Care Management Important Message (Signed)
Important Message  Patient Details  Name: [name] MRN: [id number] Date of Birth: 1951-06-18   Medicare Important Message Given:  Yes    Shelda Altes 03/26/2018, 12:29 PM

## 2018-03-27 LAB — CBC
HCT: 29.6 % — ABNORMAL LOW (ref 36.0–46.0)
Hemoglobin: 8.3 g/dL — ABNORMAL LOW (ref 12.0–15.0)
MCH: 26 pg (ref 26.0–34.0)
MCHC: 28 g/dL — ABNORMAL LOW (ref 30.0–36.0)
MCV: 92.8 fL (ref 78.0–100.0)
Platelets: 279 10*3/uL (ref 150–400)
RBC: 3.19 MIL/uL — AB (ref 3.87–5.11)
RDW: 15.5 % (ref 11.5–15.5)
WBC: 8.4 10*3/uL (ref 4.0–10.5)

## 2018-03-27 LAB — BLOOD GAS, ARTERIAL
Acid-Base Excess: 5.1 mmol/L — ABNORMAL HIGH (ref 0.0–2.0)
Bicarbonate: 28.2 mmol/L — ABNORMAL HIGH (ref 20.0–28.0)
Drawn by: 331001
O2 CONTENT: 10 L/min
O2 Saturation: 90.4 %
PCO2 ART: 54.9 mmHg — AB (ref 32.0–48.0)
Patient temperature: 98
pH, Arterial: 7.36 (ref 7.350–7.450)
pO2, Arterial: 60.8 mmHg — ABNORMAL LOW (ref 83.0–108.0)

## 2018-03-27 LAB — PROTIME-INR
INR: 3.96
Prothrombin Time: 38.4 seconds — ABNORMAL HIGH (ref 11.4–15.2)

## 2018-03-27 LAB — BASIC METABOLIC PANEL
ANION GAP: 11 (ref 5–15)
BUN: 41 mg/dL — ABNORMAL HIGH (ref 6–20)
CO2: 29 mmol/L (ref 22–32)
Calcium: 8.8 mg/dL — ABNORMAL LOW (ref 8.9–10.3)
Chloride: 92 mmol/L — ABNORMAL LOW (ref 101–111)
Creatinine, Ser: 2.02 mg/dL — ABNORMAL HIGH (ref 0.44–1.00)
GFR, EST AFRICAN AMERICAN: 28 mL/min — AB (ref >60)
GFR, EST NON AFRICAN AMERICAN: 25 mL/min — AB (ref >60)
Glucose, Bld: 217 mg/dL — ABNORMAL HIGH (ref 65–99)
POTASSIUM: 4.3 mmol/L (ref 3.5–5.1)
SODIUM: 132 mmol/L — AB (ref 135–145)

## 2018-03-27 MED ORDER — HALOPERIDOL LACTATE 5 MG/ML IJ SOLN
2.0000 mg | Freq: Once | INTRAMUSCULAR | Status: AC
  Administered 2018-03-27: 2 mg via INTRAVENOUS
  Filled 2018-03-27: qty 1

## 2018-03-27 MED ORDER — ROPINIROLE HCL 1 MG PO TABS
1.0000 mg | ORAL_TABLET | Freq: Two times a day (BID) | ORAL | Status: DC
  Administered 2018-03-27 – 2018-03-31 (×8): 1 mg via ORAL
  Filled 2018-03-27 (×10): qty 1

## 2018-03-27 MED ORDER — MORPHINE SULFATE (PF) 2 MG/ML IV SOLN
1.0000 mg | INTRAVENOUS | Status: DC | PRN
Start: 2018-03-27 — End: 2018-03-31
  Administered 2018-03-27 – 2018-03-30 (×8): 1 mg via INTRAVENOUS
  Filled 2018-03-27 (×8): qty 1

## 2018-03-27 NOTE — Progress Notes (Signed)
Subjective: She was admitted with respiratory failure.  She did stay on the BiPAP for several hours yesterday.  She looks better.  She says she is still short of breath.  Objective: Vital signs in last 24 hours: Temp:  [98 F (36.7 C)-98.8 F (37.1 C)] 98 F (36.7 C) (06/08 0800) Pulse Rate:  [77-130] 78 (06/08 0412) Resp:  [17-30] 17 (06/08 0412) BP: (62-119)/(44-101) 104/60 (06/08 0412) SpO2:  [83 %-95 %] 91 % (06/08 0412) FiO2 (%):  [55 %] 55 % (06/07 2048) Weight:  [93.2 kg (205 lb 7.5 oz)] 93.2 kg (205 lb 7.5 oz) (06/08 0400) Weight change: 0 kg (0 lb)    Intake/Output from previous day: 06/07 0701 - 06/08 0700 In: 443.8 [P.O.:240; I.V.:203.8] Out: 500 [Urine:500]  PHYSICAL EXAM General appearance: alert, cooperative and mild distress Resp: Respiratory effort is increased.  She has bilateral wheezing Cardio: irregularly irregular rhythm GI: soft, non-tender; bowel sounds normal; no masses,  no organomegaly Extremities: She has some edema and fairly marked chronic venous stasis changes  Lab Results:  Results for orders placed or performed during the hospital encounter of 03/24/18 (from the past 48 hour(s))  Basic metabolic panel     Status: Abnormal   Collection Time: 03/26/18  5:11 AM  Result Value Ref Range   Sodium 136 135 - 145 mmol/L   Potassium 4.9 3.5 - 5.1 mmol/L    Comment: DELTA CHECK NOTED   Chloride 93 (L) 101 - 111 mmol/L   CO2 35 (H) 22 - 32 mmol/L   Glucose, Bld 113 (H) 65 - 99 mg/dL   BUN 18 6 - 20 mg/dL   Creatinine, Ser 1.59 (H) 0.44 - 1.00 mg/dL   Calcium 8.6 (L) 8.9 - 10.3 mg/dL   GFR calc non Af Amer 33 (L) >60 mL/min   GFR calc Af Amer 38 (L) >60 mL/min    Comment: (NOTE) The eGFR has been calculated using the CKD EPI equation. This calculation has not been validated in all clinical situations. eGFR's persistently <60 mL/min signify possible Chronic Kidney Disease.    Anion gap 8 5 - 15    Comment: Performed at [facility], [address], [place], [place] [address]  Protime-INR     Status: Abnormal   Collection Time: 03/26/18  5:11 AM  Result Value Ref Range   Prothrombin Time 23.6 (H) 11.4 - 15.2 seconds   INR 2.13     Comment: Performed at [facility], [address], [place], [place] [address]  CBC     Status: Abnormal   Collection Time: 03/26/18  5:11 AM  Result Value Ref Range   WBC 12.0 (H) 4.0 - 10.5 K/uL   RBC 3.77 (L) 3.87 - 5.11 MIL/uL   Hemoglobin 10.3 (L) 12.0 - 15.0 g/dL   HCT 36.7 36.0 - 46.0 %   MCV 97.3 78.0 - 100.0 fL   MCH 27.3 26.0 - 34.0 pg   MCHC 28.1 (L) 30.0 - 36.0 g/dL   RDW 16.3 (H) 11.5 - 15.5 %   Platelets 269 150 - 400 K/uL    Comment: Performed at [facility], [address] [place], [place] [address]  Blood gas, arterial     Status: Abnormal   Collection Time: 03/26/18  7:41 AM  Result Value Ref Range   O2 Content 8.0 L/min   Delivery systems NASAL CANNULA    pH, Arterial 7.256 (L) 7.350 - 7.450   pCO2 arterial 76.6 (HH) 32.0 - 48.0 mmHg  Comment: CRITICAL RESULT CALLED TO, READ BACK BY AND VERIFIED WITH: MYSTI SMART,RN AT 0851 BY WENDY VIA RRT,RCP ON 03/26/2018    pO2, Arterial 52.4 (L) 83.0 - 108.0 mmHg   Bicarbonate 28.1 (H) 20.0 - 28.0 mmol/L   Acid-Base Excess 6.1 (H) 0.0 - 2.0 mmol/L   O2 Saturation 86.0 %   Patient temperature 37.0    [place]wn by [id number]    Sample type ARTERIAL DRAW    Allens test (pass/fail) PASS PASS    Comment: Performed at [facility], [address], [place], [place] [address]  Basic metabolic panel     Status: Abnormal   Collection Time: 03/27/18  4:52 AM  Result Value Ref Range   Sodium 132 (L) 135 - 145 mmol/L   Potassium 4.3 3.5 - 5.1 mmol/L   Chloride 92 (L) 101 - 111 mmol/L   CO2 29 22 - 32 mmol/L   Glucose, Bld 217 (H) 65 - 99 mg/dL   BUN 41 (H) 6 - 20 mg/dL   Creatinine, Ser 2.02 (H) 0.44 - 1.00 mg/dL   Calcium 8.8 (L) 8.9 - 10.3 mg/dL   GFR calc non Af Amer 25 (L) >60 mL/min   GFR calc Af Amer 28 (L) >60 mL/min    Comment:  (NOTE) The eGFR has been calculated using the CKD EPI equation. This calculation has not been validated in all clinical situations. eGFR's persistently <60 mL/min signify possible Chronic Kidney Disease.    Anion gap 11 5 - 15    Comment: Performed at [facility], [address], [place], [place] [address]  Protime-INR     Status: Abnormal   Collection Time: 03/27/18  4:52 AM  Result Value Ref Range   Prothrombin Time 38.4 (H) 11.4 - 15.2 seconds   INR 3.96     Comment: Performed at [facility], [address] [place], [place] [address]  CBC     Status: Abnormal   Collection Time: 03/27/18  4:52 AM  Result Value Ref Range   WBC 8.4 4.0 - 10.5 K/uL   RBC 3.19 (L) 3.87 - 5.11 MIL/uL   Hemoglobin 8.3 (L) 12.0 - 15.0 g/dL   HCT 29.6 (L) 36.0 - 46.0 %   MCV 92.8 78.0 - 100.0 fL   MCH 26.0 26.0 - 34.0 pg   MCHC 28.0 (L) 30.0 - 36.0 g/dL   RDW 15.5 11.5 - 15.5 %   Platelets 279 150 - 400 K/uL    Comment: Performed at [facility], [address] [place], [place] [address]  Blood gas, arterial     Status: Abnormal   Collection Time: 03/27/18  8:57 AM  Result Value Ref Range   O2 Content 10.0 L/min   Delivery systems NASAL CANNULA    pH, Arterial 7.360 7.350 - 7.450   pCO2 arterial 54.9 (H) 32.0 - 48.0 mmHg   pO2, Arterial 60.8 (L) 83.0 - 108.0 mmHg   Bicarbonate 28.2 (H) 20.0 - 28.0 mmol/L   Acid-Base Excess 5.1 (H) 0.0 - 2.0 mmol/L   O2 Saturation 90.4 %   Patient temperature 98.0    Collection site RIGHT RADIAL    Drawn by [id number]    Sample type ARTERIAL DRAW    Allens test (pass/fail) PASS PASS    Comment: Performed at [facility], [address], [place], [place] [address]    ABGS Recent Labs    03/27/18 0857  PHART 7.360  PO2ART 60.8*  HCO3 28.2*   CULTURES Recent Results (from the  past 240 hour(s))  MRSA PCR Screening     Status: None   Collection Time: 03/25/18 12:38 AM  Result Value Ref Range Status   MRSA by PCR NEGATIVE NEGATIVE Final    Comment:         The GeneXpert MRSA Assay (FDA approved for NASAL specimens only), is one component of a comprehensive MRSA colonization surveillance program. It is not intended to diagnose MRSA infection nor to guide or monitor treatment for MRSA infections. Performed at [facility], [address], [place], [place] [address]    Studies/Results: Dg Chest Port 1 View  Result Date: 03/26/2018 CLINICAL DATA:  Respiratory failure EXAM: PORTABLE CHEST 1 VIEW COMPARISON:  March 24, 2018 FINDINGS: There is cardiomegaly with pulmonary vascular congestion and mild interstitial edema. There are small pleural effusions bilaterally. There is consolidation in both lower lobes medially. Patient is status post aortic valve replacement. No evident adenopathy. No bone lesions. There is aortic atherosclerosis. IMPRESSION: Pulmonary vascular congestion with suspected congestive heart failure given interstitial pulmonary edema and pleural effusions bilaterally. Consolidation in both lower lobes is likely due to bilateral pneumonia, although there may be a degree of alveolar edema causing this opacity. Both entities may be present concurrently. Patient is status post aortic valve replacement. There is aortic atherosclerosis. Aortic Atherosclerosis (ICD10-I70.0). Electronically Signed   By: Lowella Grip III M.D.   On: 03/26/2018 13:57    Medications:  Prior to Admission:  Medications Prior to Admission  Medication Sig Dispense Refill Last Dose  . albuterol (PROVENTIL HFA;VENTOLIN HFA) 108 (90 BASE) MCG/ACT inhaler Inhale 1-2 puffs into the lungs every 6 (six) hours as needed for wheezing or shortness of breath.    03/24/2018 at Unknown time  . allopurinol (ZYLOPRIM) 100 MG tablet Take 100 mg by mouth daily.   03/24/2018 at Unknown time  . ALPRAZolam (XANAX) 1 MG tablet Take 1 mg by mouth every morning. *May take one tablet two times daily as needed for anxiety*   03/24/2018 at Unknown time  . benzonatate (TESSALON) 200 MG capsule  Take 1 capsule (200 mg total) by mouth 2 (two) times daily as needed for cough. (Patient taking differently: Take 200 mg by mouth 2 (two) times daily. *May take one capsule every 8 hours as needed for cough*) 20 capsule 0 03/24/2018 at Unknown time  . calcium carbonate (OS-CAL) 600 MG TABS tablet Take 600 mg by mouth 2 (two) times daily.   03/24/2018 at Unknown time  . cetirizine (ZYRTEC) 10 MG tablet Take 10 mg by mouth daily.     03/24/2018 at Unknown time  . clobetasol cream (TEMOVATE) 8.85 % Apply 1 application topically 2 (two) times daily as needed (FOR SKIN IRRITATION/REDNESS & BLISTERING ON LEGS).    unknown  . CRESTOR 10 MG tablet Take 10 mg by mouth at bedtime.    03/23/2018 at Unknown time  . cyclobenzaprine (FLEXERIL) 10 MG tablet Take 10 mg by mouth 3 (three) times daily as needed for muscle spasms.    unknown  . fluconazole (DIFLUCAN) 100 MG tablet Take 100 mg by mouth daily.  0 unknown  . furosemide (LASIX) 40 MG tablet Take 40 mg by mouth 2 (two) times daily.    03/24/2018 at Unknown time  . HYDROcodone-acetaminophen (NORCO) 10-325 MG per tablet Take 1 tablet by mouth at bedtime. *May take one tablet every 12 hours as needed for pain*   03/23/2018 at Unknown time  . HYDROcodone-homatropine (HYCODAN) 5-1.5 MG/5ML syrup Take 5 mLs by  mouth 4 (four) times daily as needed for cough.   0 unknown  . levothyroxine (SYNTHROID, LEVOTHROID) 50 MCG tablet Take 50 mcg by mouth daily before breakfast.    03/24/2018 at Unknown time  . magnesium oxide (MAG-OX) 400 MG tablet Take 400 mg by mouth at bedtime.   03/24/2018 at Unknown time  . metoprolol succinate (TOPROL-XL) 50 MG 24 hr tablet Take 1 tablet (50 mg total) by mouth daily. 30 tablet 0 03/24/2018 at 700a  . mometasone (NASONEX) 50 MCG/ACT nasal spray Place 2 sprays into the nose at bedtime.  0 unknown  . Multiple Vitamin (MULTIVITAMIN WITH MINERALS) TABS tablet Take 1 tablet by mouth daily.   03/24/2018 at Unknown time  . omeprazole (PRILOSEC) 20 MG capsule Take  20 mg by mouth daily.   03/24/2018 at Unknown time  . OXYGEN Inhale 2 L into the lungs daily.    03/24/2018 at Unknown time  . potassium chloride (K-DUR) 10 MEQ tablet Take 1 tablet (10 mEq total) by mouth daily. 90 tablet 3 03/24/2018 at Unknown time  . rOPINIRole (REQUIP) 3 MG tablet Take 3 mg by mouth 2 (two) times daily.    03/24/2018 at Unknown time  . temazepam (RESTORIL) 15 MG capsule Take 15 mg by mouth at bedtime.    03/23/2018 at Unknown time  . warfarin (COUMADIN) 2 MG tablet Takes one tablet (36m) everyday (Patient taking differently: Take 2-3 mg by mouth See admin instructions. 219mon Tuesday,Thursday,Saturday,Sunda, and 68m34mn Monday, Wednesday, and Friday) 45 tablet 3 03/23/2018 at Unknown time  . enoxaparin (LOVENOX) 100 MG/ML injection Inject 1 mL (100 mg total) into the skin every 12 (twelve) hours. (Patient not taking: Reported on 03/24/2018) 20 Syringe 0 Not Taking at Unknown time   Scheduled: . allopurinol  100 mg Oral Daily  . ALPRAZolam  1 mg Oral q morning - 10a  . bisoprolol  5 mg Oral Daily  . budesonide (PULMICORT) nebulizer solution  0.25 mg Nebulization BID  . calcium carbonate  1 tablet Oral BID WC  . furosemide  60 mg Intravenous BID  . HYDROcodone-acetaminophen  1 tablet Oral QHS  . ipratropium  0.5 mg Nebulization TID  . levalbuterol  1.25 mg Nebulization TID  . levothyroxine  50 mcg Oral QAC breakfast  . loratadine  10 mg Oral Daily  . magnesium oxide  400 mg Oral QHS  . methylPREDNISolone (SOLU-MEDROL) injection  40 mg Intravenous Q6H  . multivitamin with minerals  1 tablet Oral Daily  . pantoprazole  40 mg Oral Daily  . potassium chloride  10 mEq Oral TID  . rOPINIRole  3 mg Oral BID  . rosuvastatin  10 mg Oral QHS  . Warfarin - Pharmacist Dosing Inpatient   Does not apply q1800   Continuous: . diltiazem (CARDIZEM) infusion 5 mg/hr (03/26/18 2032)   PRNWER:[id number]** acetaminophen, benzonatate, cyclobenzaprine, levalbuterol, morphine injection, ondansetron  **OR** ondansetron (ZOFRAN) IV, temazepam  Assesment: She has acute on chronic hypoxic and hypercapnic respiratory failure.  She looks better than she did yesterday.  Blood gas is pending this morning.  She did use BiPAP and I encouraged her to continue to do that.  She is known to have coronary disease but she is not having any chest pain  She is in atrial fib with RVR and some element of heart failure as well. Principal Problem:   Acute on chronic respiratory failure with hypercapnia (HCC) Active Problems:   Hyperlipidemia   Coronary atherosclerosis of native coronary  artery   Anemia   Hypomagnesemia   Hypokalemia   Hypophosphatemia   Atrial fibrillation with RVR (HCC)   Elevated troponin    Plan: Continue treatments.  BiPAP as needed.  I will be out of town starting this afternoon and will return on 17 June    LOS: 3 days   Mayer Vondrak L 03/27/2018, 9:42 AM

## 2018-03-27 NOTE — Progress Notes (Signed)
Patient on 10 lpm high flow off BiPAP for now. Patient is awake not sure how mental stable she is.or her norm

## 2018-03-27 NOTE — Progress Notes (Signed)
Patient currently resting comfortably after Haldol given. O2 93% on HFNC 10L. Will continue to monitor.

## 2018-03-27 NOTE — Progress Notes (Addendum)
Patient took neb , Tried to place on BiPAP patient came out of bed had to have nurse sedate patient again. Placed back on 10 lpm nasal cannula.

## 2018-03-27 NOTE — Progress Notes (Signed)
PROGRESS NOTE    [name]  QQV:[id number] DOB: 1951/05/12 DOA: 03/24/2018 PCP: Octavio Graves, DO   Brief Narrative:   [name] is a 67 y.o. 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  She has apparently not been wearing her home oxygen routinely and was admitted with acute diastolic CHF as well as atrial fibrillation with RVR and acute on chronic respiratory failure with hypercapnia.  Assessment & Plan:   Principal Problem:   Acute on chronic respiratory failure with hypercapnia (HCC) Active Problems:   Hyperlipidemia   Coronary atherosclerosis of native coronary artery   Anemia   Hypomagnesemia   Hypokalemia   Hypophosphatemia   Atrial fibrillation with RVR (HCC)   Elevated troponin   1. Acute on chronic respiratory failure with hypercapnia.  Continue nebulizer treatments and Pulmicort.  Appreciate pulmonology recommendations with initiation of IV steroids.  Repeat ABG pending after bipap last night. 2. Atrial fibrillation with improved rate control.  Continue IV Cardizem in the acute setting.  Appreciate cardiology evaluation.  Metoprolol changed to bisoprolol for beta-1 selectivity.  Additionally Lasix dose has been increased to 60 mg IV twice daily as she has not had much diuresis; will consult Nephrology for opinion with noted increase in creatinine levels. 3. Elevated troponin.  Likely secondary to demand ischemia with no chest pain noted.  2D echocardiogram with no acute findings. 4. Dyslipidemia.  Continue Crestor. 5. CAD.  Continue  beta-blocker, statin, warfarin.  No chest pain currently noted. 6. Anemia.  Continue to monitor with no overt bleeding currently noted especially while on anticoagulation.   DVT prophylaxis: On warfarin Code Status: Full code Family Communication: None at bedside Disposition Plan: Continue to work on improving cardiopulmonary status with ongoing diuresis and rate control as well as treatment for acute bronchospasms.   Consultants:   Cardiology  Pulmonology  Procedures:   None  Antimicrobials:   None   Subjective: Patient seen and evaluated today with overall improvement after bipap use last night. HR is also better controlled. No acute events overnight. She is not diuresing well and continues to require 10L high flow [place].  Objective: Vitals:   03/27/18 0400 03/27/18 0412 03/27/18 0800 03/27/18 1018  BP:  104/60    Pulse: 77 78    Resp: (!) 29 17    Temp: 98.4 F (36.9 C)  98 F (36.7 C)   TempSrc:   Oral   SpO2: (!) 83% 91%  93%  Weight: 93.2 kg (205 lb 7.5 oz)     Height:        Intake/Output Summary (Last 24 hours) at 03/27/2018 1125 Last data filed at 03/27/2018 0359 Gross per 24 hour  Intake 318.75 ml  Output 500 ml  Net -181.25 ml   Filed Weights   03/25/18 0445 03/26/18 0500 03/27/18 0400  Weight: 91.7 kg (202 lb 2.6 oz) 93.2 kg (205 lb 7.5 oz) 93.2 kg (205 lb 7.5 oz)    Examination:  General exam: Appears calm and comfortable; somnolent Respiratory system: Wheezing noted at bases Cardiovascular system: S1 & S2 heard, irregular/tachycardic. No JVD, murmurs, rubs, gallops or clicks. No  pedal edema. Gastrointestinal system: Abdomen is nondistended, soft and nontender. No organomegaly or masses felt. Normal bowel sounds heard. Central nervous system: Alert and oriented. No focal neurological deficits. Extremities: Symmetric 5 x 5 power. Skin: No rashes, lesions or ulcers    Data Reviewed: I have personally reviewed following labs and imaging  studies  CBC: Recent Labs  Lab 03/24/18 1751 03/25/18 0531 03/26/18 0511 03/27/18 0452  WBC 9.1 9.0 12.0* 8.4  NEUTROABS 7.2 5.7  --   --   HGB 9.7* 9.6* 10.3* 8.3*  HCT 32.7* 33.3* 36.7 29.6*  MCV 93.2 94.3 97.3 92.8  PLT 220 235 269 242   Basic Metabolic Panel: Recent Labs  Lab 03/24/18 1751 03/25/18 0531 03/26/18 0511 03/27/18 0452  NA 141 140 136 132*  K 3.2* 3.9 4.9 4.3  CL 99* 98* 93* 92*  CO2 33* 34* 35* 29  GLUCOSE 113* 103* 113* 217*  BUN 11 12 18  41*  CREATININE 1.11* 1.26* 1.59* 2.02*  CALCIUM 8.9 8.5* 8.6* 8.8*  MG 1.6*  --   --   --   PHOS 2.4*  --   --   --    GFR: Estimated Creatinine Clearance: 32.1 mL/min (A) (by C-G formula based on SCr of 2.02 mg/dL (H)). Liver Function Tests: Recent Labs  Lab 03/25/18 0531  AST 30  ALT 17  ALKPHOS 76  BILITOT 1.3*  PROT 7.2  ALBUMIN 3.5   No results for input(s): LIPASE, AMYLASE in the last 168 hours. No results for input(s): AMMONIA in the last 168 hours. Coagulation Profile: Recent Labs  Lab 03/23/18 1006 03/25/18 0531 03/26/18 0511 03/27/18 0452  INR 1.9* 1.76 2.13 3.96   Cardiac Enzymes: Recent Labs  Lab 03/24/18 1751 03/24/18 2354 03/25/18 0531  TROPONINI 0.03* 0.19* 0.14*   BNP (last 3 results) No results for input(s): PROBNP in the last 8760 hours. HbA1C: No results for input(s): HGBA1C in the last 72 hours. CBG: No results for input(s): GLUCAP in the last 168 hours. Lipid Profile: No results for input(s): CHOL, HDL, LDLCALC, TRIG, CHOLHDL, LDLDIRECT in the last 72 hours. Thyroid Function Tests: No results for input(s): TSH, T4TOTAL, FREET4, T3FREE, THYROIDAB in the last 72 hours. Anemia Panel: No results for input(s): VITAMINB12, FOLATE, FERRITIN, TIBC, IRON, RETICCTPCT in the last 72 hours. Sepsis Labs: No results for input(s): PROCALCITON, LATICACIDVEN in the last 168 hours.  Recent Results (from the past 240 hour(s))  MRSA PCR Screening     Status: None   Collection  Time: 03/25/18 12:38 AM  Result Value Ref Range Status   MRSA by PCR NEGATIVE NEGATIVE Final    Comment:        The GeneXpert MRSA Assay (FDA approved for NASAL specimens only), is one component of a comprehensive MRSA colonization surveillance program. It is not intended to diagnose MRSA infection nor to guide or monitor treatment for MRSA infections. Performed at [facility], [address] [place], [place] [address]          Radiology Studies: Dg Chest [facility] 1 View  Result Date: 03/26/2018 CLINICAL DATA:  Respiratory failure EXAM: PORTABLE CHEST 1 VIEW COMPARISON:  March 24, 2018 FINDINGS: There is cardiomegaly with pulmonary vascular congestion and mild interstitial edema. There are small pleural effusions bilaterally. There is consolidation in both lower lobes medially. Patient is status post aortic valve replacement. No evident adenopathy. No bone lesions. There is aortic atherosclerosis. IMPRESSION: Pulmonary vascular congestion with suspected congestive heart failure given interstitial pulmonary edema and  pleural effusions bilaterally. Consolidation in both lower lobes is likely due to bilateral pneumonia, although there may be a degree of alveolar edema causing this opacity. Both entities may be present concurrently. Patient is status post aortic valve replacement. There is aortic atherosclerosis. Aortic Atherosclerosis (ICD10-I70.0). Electronically Signed   By: Lowella Grip III M.D.   On: 03/26/2018 13:57        Scheduled Meds: . allopurinol  100 mg Oral Daily  . ALPRAZolam  1 mg Oral q morning - 10a  . bisoprolol  5 mg Oral Daily  . budesonide (PULMICORT) nebulizer solution  0.25 mg Nebulization BID  . calcium carbonate  1 tablet Oral BID WC  . furosemide  60 mg Intravenous BID  . HYDROcodone-acetaminophen  1 tablet Oral QHS  . ipratropium  0.5 mg Nebulization TID  . levalbuterol  1.25 mg Nebulization TID  . levothyroxine  50 mcg Oral QAC breakfast  . loratadine   10 mg Oral Daily  . magnesium oxide  400 mg Oral QHS  . methylPREDNISolone (SOLU-MEDROL) injection  40 mg Intravenous Q6H  . multivitamin with minerals  1 tablet Oral Daily  . pantoprazole  40 mg Oral Daily  . potassium chloride  10 mEq Oral TID  . rOPINIRole  3 mg Oral BID  . rosuvastatin  10 mg Oral QHS  . Warfarin - Pharmacist Dosing Inpatient   Does not apply q1800   Continuous Infusions: . diltiazem (CARDIZEM) infusion 5 mg/hr (03/26/18 2032)     LOS: 3 days    Time spent: 30 minutes    Eward Rutigliano Darleen Crocker, DO [facility] Pager [phone]  If 7PM-7AM, please contact night-coverage [email or web address] Password [facility] 03/27/2018, 11:25 AM

## 2018-03-27 NOTE — Progress Notes (Signed)
Patient very agitated and confused, pulling bipap off. Placed on HFNC 10L, O2 sat now 92% on HFNC. Dr Maudie Mercury notified of mental status, order given for Haldol 2 mg IV once. Will continue to monitor

## 2018-03-27 NOTE — Progress Notes (Signed)
Per RN, Warren Lacy RN called and stated patient needed to go on BIPAP due to restlessness. RT assessed patient and pt in no distress, pt is restless and pulling at things but Spo2 95% on 10L HFNC and patient wanting to eat.

## 2018-03-27 NOTE — Progress Notes (Signed)
Patient has done well With BiPAP will let her wear as tolerated. She Has CPAP at home by MD notes. She states that it was her daughters CPAP. It is hard to determine exactly what is correct. Will continue.

## 2018-03-27 NOTE — Progress Notes (Signed)
ANTICOAGULATION CONSULT NOTE - follow up [facility] for warfarin Indication: atrial fibrillation  Patient Measurements: Height: 5\' 7"  (170.2 cm) Weight: 205 lb 7.5 oz (93.2 kg) IBW/kg (Calculated) : 61.6   Vital Signs: Temp: 98.4 F (36.9 C) (06/08 0400) Temp Source: Axillary (06/08 0000) BP: 104/60 (06/08 0412) Pulse Rate: 78 (06/08 0412)  Labs: Recent Labs    03/24/18 1751 03/24/18 2354 03/25/18 0531 03/26/18 0511 03/27/18 0452  HGB 9.7*  --  9.6* 10.3* 8.3*  HCT 32.7*  --  33.3* 36.7 29.6*  PLT 220  --  235 269 279  LABPROT  --   --  20.4* 23.6* 38.4*  INR  --   --  1.76 2.13 3.96  CREATININE 1.11*  --  1.26* 1.59* 2.02*  TROPONINI 0.03* 0.19* 0.14*  --   --     Estimated Creatinine Clearance: 32.1 mL/min (A) (by C-G formula based on SCr of 2.02 mg/dL (H)).    Assessment: INR 1.76 today Pharmacy consulted to dose warfarin for patient with atrial fibrillation. INR doubled today and is supratherapeutic .  Home dose is 2mg  on Tues, Thurs, Sat, Sunday; 3mg  on MWF  Goal of Therapy:  INR 2-3 Monitor platelets by anticoagulation protocol: Yes   Plan: No coumadin today Daily PT-INR Monitor for s/s of bleeding  Isac Sarna, BS [place]ena [place], BCPS Clinical Pharmacist Pager [phone] 03/27/2018,8:26 AM

## 2018-03-28 ENCOUNTER — Inpatient Hospital Stay (HOSPITAL_COMMUNITY): Payer: Medicare HMO

## 2018-03-28 LAB — BASIC METABOLIC PANEL
Anion gap: 10 (ref 5–15)
BUN: 62 mg/dL — ABNORMAL HIGH (ref 6–20)
CALCIUM: 9 mg/dL (ref 8.9–10.3)
CHLORIDE: 93 mmol/L — AB (ref 101–111)
CO2: 30 mmol/L (ref 22–32)
CREATININE: 2.1 mg/dL — AB (ref 0.44–1.00)
GFR calc non Af Amer: 23 mL/min — ABNORMAL LOW (ref >60)
GFR, EST AFRICAN AMERICAN: 27 mL/min — AB (ref >60)
Glucose, Bld: 157 mg/dL — ABNORMAL HIGH (ref 65–99)
Potassium: 4.8 mmol/L (ref 3.5–5.1)
SODIUM: 133 mmol/L — AB (ref 135–145)

## 2018-03-28 LAB — PROTIME-INR
INR: 4.69 — AB
PROTHROMBIN TIME: 43.8 s — AB (ref 11.4–15.2)

## 2018-03-28 MED ORDER — METHYLPREDNISOLONE SODIUM SUCC 40 MG IJ SOLR
40.0000 mg | Freq: Two times a day (BID) | INTRAMUSCULAR | Status: DC
  Administered 2018-03-28 – 2018-03-31 (×7): 40 mg via INTRAVENOUS
  Filled 2018-03-28 (×7): qty 1

## 2018-03-28 MED ORDER — SODIUM POLYSTYRENE SULFONATE 15 GM/60ML PO SUSP: 30.0000 g | Freq: Once | ORAL | Status: DC

## 2018-03-28 MED ORDER — ALPRAZOLAM 0.5 MG PO TABS
1.0000 mg | ORAL_TABLET | Freq: Every morning | ORAL | Status: DC
  Filled 2018-03-28: qty 2

## 2018-03-28 MED ORDER — ALPRAZOLAM 0.25 MG PO TABS
0.2500 mg | ORAL_TABLET | Freq: Every morning | ORAL | Status: DC
  Administered 2018-03-28 – 2018-03-31 (×4): 0.25 mg via ORAL
  Filled 2018-03-28 (×4): qty 1

## 2018-03-28 NOTE — Progress Notes (Addendum)
PROGRESS NOTE    [name]  GMW:[id number] DOB: 11-Nov-1950 DOA: 03/24/2018 PCP: Octavio Graves, DO   Brief Narrative:   [name] is a 67 y.o. 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  She has apparently not been wearing her home oxygen routinely and was admitted with acute diastolic CHF as well as atrial fibrillation with RVR and acute on chronic respiratory failure with hypercapnia.  Assessment & Plan:   Principal Problem:   Acute on chronic respiratory failure with hypercapnia (HCC) Active Problems:   Hyperlipidemia   Coronary atherosclerosis of native coronary artery   Anemia   Hypomagnesemia   Hypokalemia   Hypophosphatemia   Atrial fibrillation with RVR (HCC)   Elevated troponin   1. Acute on chronic respiratory failure with hypercapnia.  Oxygen via nasal cannula has been titrated down to 6 L a day and therefore, appears to be improving.  Should improve further with adequate diuresis.  Continue nebulizer treatments and Pulmicort.  Appreciate pulmonology recommendations with initiation of IV steroids which I will decrease to BID today.  Repeat ABG improved with less hypercapnia. 2. Atrial fibrillation with improved rate control.  Continue IV Cardizem in the acute setting.  Appreciate cardiology evaluation.  Metoprolol changed to bisoprolol for beta-1 selectivity.  Additionally Lasix dose has been increased to 60 mg IV twice daily as she is improving diuresis now.  Warfarin currently being held by pharmacy due to supratherapeutic INR. 3. AKI on CKD  stage III.  Appreciate nephrology evaluation.  Continue current Lasix dose. 4. Hyperkalemia. Hold potassium supplementation and administer kayexelate x 1 dose. Recheck labs in am. 5. Elevated troponin.  Likely secondary to demand ischemia with no chest pain noted.  2D echocardiogram with no acute findings. 6. Anxiety/agitation.  Required Haldol overnight.  Will restart home Xanax at lower dose and continue to monitor.  May have some component of delirium.  Decrease steroids. 7. Dyslipidemia.  Continue Crestor. 8. CAD.  Continue beta-blocker, statin, warfarin.  No chest pain currently noted. 9. Anemia.  Continue to monitor with no overt bleeding currently noted especially while on anticoagulation.   DVT prophylaxis: On warfarin Code Status: Full code Family Communication: None at bedside Disposition Plan: Continue to work on improving cardiopulmonary status with ongoing diuresis and rate control as well as treatment for acute bronchospasms.   Consultants:   Cardiology  Pulmonology  Procedures:   None  Antimicrobials:   None   Subjective: Patient seen and evaluated today with some agitation overnight.  This is especially with the BiPAP mask and she required multiple doses of Haldol.  She is alert and calm this morning.  Oxygen has been decreased to 6 L.  Objective: Vitals:   03/28/18 0200 03/28/18 0230 03/28/18 0757 03/28/18 0848  BP: (!) 89/63     Pulse: 79 77    Resp: 14 14    Temp:   98 F (36.7 C)   TempSrc:   Oral   SpO2: 98% 98%    Weight:    95 kg (209 lb 7 oz)  Height:        Intake/Output Summary (Last 24  hours) at 03/28/2018 1224 Last data filed at 03/28/2018 0849 Gross per 24 hour  Intake 905 ml  Output 1650 ml  Net -745 ml   Filed Weights   03/26/18 0500 03/27/18 0400 03/28/18 0848  Weight: 93.2 kg (205 lb 7.5 oz) 93.2 kg (205 lb 7.5 oz) 95 kg (209 lb 7 oz)    Examination:  General exam: Appears calm and comfortable Respiratory system: Wheezing  noted at bases on 6L [place] Cardiovascular system: S1 & S2 heard, irregular/tachycardic. No JVD, murmurs, rubs, gallops or clicks. No pedal edema. Gastrointestinal system: Abdomen is nondistended, soft and nontender. No organomegaly or masses felt. Normal bowel sounds heard. Central nervous system: Alert and oriented. No focal neurological deficits. Extremities: Symmetric 5 x 5 power. Skin: No rashes, lesions or ulcers    Data Reviewed: I have personally reviewed following labs and imaging studies  CBC: Recent Labs  Lab 03/24/18 1751 03/25/18 0531 03/26/18 0511 03/27/18 0452  WBC 9.1 9.0 12.0* 8.4  NEUTROABS 7.2 5.7  --   --   HGB 9.7* 9.6* 10.3* 8.3*  HCT 32.7* 33.3* 36.7 29.6*  MCV 93.2 94.3 97.3 92.8  PLT 220 235 269 053   Basic Metabolic Panel: Recent Labs  Lab 03/24/18 1751 03/25/18 0531 03/26/18 0511 03/27/18 0452 03/28/18 0520  NA 141 140 136 132* 133*  K 3.2* 3.9 4.9 4.3 4.8  CL 99* 98* 93* 92* 93*  CO2 33* 34* 35* 29 30  GLUCOSE 113* 103* 113* 217* 157*  BUN 11 12 18  41* 62*  CREATININE 1.11* 1.26* 1.59* 2.02* 2.10*  CALCIUM 8.9 8.5* 8.6* 8.8* 9.0  MG 1.6*  --   --   --   --   PHOS 2.4*  --   --   --   --    GFR: Estimated Creatinine Clearance: 31.2 mL/min (A) (by C-G formula based on SCr of 2.1 mg/dL (H)). Liver Function Tests: Recent Labs  Lab 03/25/18 0531  AST 30  ALT 17  ALKPHOS 76  BILITOT 1.3*  PROT 7.2  ALBUMIN 3.5   No results for input(s): LIPASE, AMYLASE in the last 168 hours. No results for input(s): AMMONIA in the last 168 hours. Coagulation Profile: Recent Labs  Lab 03/23/18 1006 03/25/18 0531 03/26/18 0511 03/27/18 0452 03/28/18 0520  INR 1.9* 1.76 2.13 3.96 4.69*   Cardiac Enzymes: Recent Labs  Lab 03/24/18 1751 03/24/18 2354 03/25/18 0531  TROPONINI 0.03* 0.19* 0.14*   BNP (last 3 results) No results for input(s): PROBNP in the last 8760 hours. HbA1C: No results for input(s): HGBA1C in the last 72 hours. CBG: No  results for input(s): GLUCAP in the last 168 hours. Lipid Profile: No results for input(s): CHOL, HDL, LDLCALC, TRIG, CHOLHDL, LDLDIRECT in the last 72 hours. Thyroid Function Tests: No results for input(s): TSH, T4TOTAL, FREET4, T3FREE, THYROIDAB in the last 72 hours. Anemia Panel: No results for input(s): VITAMINB12, FOLATE, FERRITIN, TIBC, IRON, RETICCTPCT in the last 72 hours. Sepsis Labs: No results for input(s): PROCALCITON, LATICACIDVEN in the last 168 hours.  Recent Results (from the past 240 hour(s))  MRSA PCR Screening     Status: None   Collection Time: 03/25/18 12:38 AM  Result Value Ref Range Status   MRSA by PCR NEGATIVE NEGATIVE Final    Comment:        The GeneXpert MRSA Assay (FDA approved for NASAL specimens only), is one component of a comprehensive MRSA colonization surveillance program. It is not intended to diagnose MRSA infection  nor to guide or monitor treatment for MRSA infections. Performed at [facility], [address], [place], [place] [address]          Radiology Studies: Dg Chest [facility] 1 View  Result Date: 03/26/2018 CLINICAL DATA:  Respiratory failure EXAM: PORTABLE CHEST 1 VIEW COMPARISON:  March 24, 2018 FINDINGS: There is cardiomegaly with pulmonary vascular congestion and mild interstitial edema. There are small pleural effusions bilaterally. There is consolidation in both lower lobes medially. Patient is status post aortic valve replacement. No evident adenopathy. No bone lesions. There is aortic atherosclerosis. IMPRESSION: Pulmonary vascular congestion with suspected congestive heart failure given interstitial pulmonary edema and pleural effusions bilaterally. Consolidation in both lower lobes is likely due to bilateral pneumonia, although there may be a degree of alveolar edema causing this opacity. Both entities may be present concurrently. Patient is status post aortic valve replacement. There is aortic atherosclerosis. Aortic Atherosclerosis  (ICD10-I70.0). Electronically Signed   By: Lowella Grip III M.D.   On: 03/26/2018 13:57        Scheduled Meds: . allopurinol  100 mg Oral Daily  . ALPRAZolam  0.25 mg Oral q morning - 10a  . bisoprolol  5 mg Oral Daily  . budesonide (PULMICORT) nebulizer solution  0.25 mg Nebulization BID  . calcium carbonate  1 tablet Oral BID WC  . furosemide  60 mg Intravenous BID  . ipratropium  0.5 mg Nebulization TID  . levalbuterol  1.25 mg Nebulization TID  . levothyroxine  50 mcg Oral QAC breakfast  . loratadine  10 mg Oral Daily  . magnesium oxide  400 mg Oral QHS  . methylPREDNISolone (SOLU-MEDROL) injection  40 mg Intravenous Q12H  . multivitamin with minerals  1 tablet Oral Daily  . pantoprazole  40 mg Oral Daily  . rOPINIRole  1 mg Oral BID  . rosuvastatin  10 mg Oral QHS  . Warfarin - Pharmacist Dosing Inpatient   Does not apply q1800   Continuous Infusions: . diltiazem (CARDIZEM) infusion 5 mg/hr (03/27/18 1647)     LOS: 4 days    Time spent: 30 minutes    Declan Adamson Darleen Crocker, DO [facility] Pager [phone]  If 7PM-7AM, please contact night-coverage [email or web address] Password [facility] 03/28/2018, 12:24 PM

## 2018-03-28 NOTE — Progress Notes (Signed)
Patient appears better respiratory wise. Starting to decreased Oxygen back to base line. BiPAP she is not using. She knows where she is but still wants to go home so I don't think she understands she is still sick. Still fall risk. She has CPAP at home but she will not use.

## 2018-03-28 NOTE — Consult Note (Signed)
Reason for Consult: Renal failure Referring Physician: Dr. [name] is an 67 y.o. female.  HPI: She is a patient who has history of coronary artery disease status post bypass surgery, chronic atrial fibrillation, aortic stenosis status post aortic valve replacement in 2007, sleep apnea and renal failure presently came to the hospital with complaints of difficulty breathing.  When she was evaluated she was found to have congestive heart failure hence admitted to the hospital.  Presently patient is states that she is feeling much better.  She denies any cough.  Patient denies also any history of renal failure or kidney stone.  Her appetite is poor but she does not have any nausea or vomiting.  Past Medical History:  Diagnosis Date  . Acute on chronic diastolic (congestive) heart failure ([place])   . Anxiety disorder   . Aortic stenosis    Status post St. Jude mechanical AVR 2007  . Asthma   . Atrial fibrillation ([place])   . Carcinoid tumor of colon 2007  . Coronary atherosclerosis of native coronary artery    Status post CABG 2007  . GERD (gastroesophageal reflux disease)   . History of colonoscopy 2003   Dr. Gala Romney - normal  . Hyperlipidemia   . Macromastia   . Pedal edema    Bilateral, chronic  . Peptic stricture of esophagus 10/04/2010   GE junction on last EGD by Dr. Gala Romney, benign biopsies  . RLS (restless legs syndrome)   . Schatzki's ring     Past Surgical History:  Procedure Laterality Date  . ABDOMINAL HYSTERECTOMY    . AORTIC VALVE REPLACEMENT  2007   #25 mm St. 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 APPENDECTOMY  2007  . BACK SURGERY     lumbar 4 and 5   . BIOPSY  02/05/2012   RMR:Two tongues of salmon-colored epithelium distal esophagus, very short-segment Barrett's s/p bx/Small hiatal hernia, otherwise normal stomach, D1, D2. Status post esophageal dilation. Biopsy showed GERD.  . Breast cyst removed     bilateral   . BREAST REDUCTION SURGERY    . CESAREAN SECTION    . 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 COLONOSCOPY  02/05/2012   [facility]:NOBSJG rectum, sigmoid diverticulosis,descending colon polyp , tubular adenoma  . CORONARY ARTERY BYPASS GRAFT     06/2006 - RIMA to RCA, SVG to RCA  . ESOPHAGOGASTRODUODENOSCOPY  10/03/2010   Dr. Gala Romney- schatzki's ring, shoft peptic stricture at GE junction.  . ESOPHAGOGASTRODUODENOSCOPY (EGD) WITH PROPOFOL N/A 02/08/2018   Procedure: ESOPHAGOGASTRODUODENOSCOPY (EGD) WITH PROPOFOL;  Surgeon: Daneil Dolin, MD;  Location: [facility];  Service: Endoscopy;  Laterality: N/A;  8:15am  . FOOT SURGERY     bilateral bunionectomy  . HERNIA REPAIR     with mesh  . LAPAROTOMY  2007   small bowel resection secondary to small bowel obstruction  . MALONEY DILATION  02/05/2012   Procedure: Venia Minks DILATION;  Surgeon: Daneil Dolin, MD;  Location: [facility] ORS;  Service: Endoscopy;  Laterality: N/A;  58m   . MALONEY DILATION N/A 02/08/2018   Procedure: MVenia MinksDILATION;  Surgeon: RDaneil Dolin MD;  Location: [facility];  Service: Endoscopy;  Laterality: N/A;  . Teeth removal      Family History  Problem Relation Age of Onset  . Stroke Mother   . Cancer Father   . Anesthesia problems Neg Hx   . Hypotension Neg  Hx   . Malignant hyperthermia Neg Hx   . Pseudochol deficiency Neg Hx     Social History:  reports that she quit smoking about 29 years ago. Her smoking use included cigarettes. She has a 20.00 pack-year smoking history. She has never used smokeless tobacco. She reports that she does not drink alcohol or use drugs.  Allergies:  Allergies  Allergen Reactions  . Penicillins Hives, Itching, Swelling and Rash    Has patient had a PCN reaction causing immediate rash, facial/tongue/throat swelling, SOB or lightheadedness with hypotension: Yes Has patient had a PCN reaction causing severe rash involving mucus membranes or skin necrosis: Yes Has  patient had a PCN reaction that required hospitalization: unknown Has patient had a PCN reaction occurring within the last 10 years: No If all of the above answers are "NO", then may proceed with Cephalosporin use.    Throat swelling    Medications: I have reviewed the patient's current medications.  Results for orders placed or performed during the hospital encounter of 03/24/18 (from the past 48 hour(s))  Basic metabolic panel     Status: Abnormal   Collection Time: 03/27/18  4:52 AM  Result Value Ref Range   Sodium 132 (L) 135 - 145 mmol/L   Potassium 4.3 3.5 - 5.1 mmol/L   Chloride 92 (L) 101 - 111 mmol/L   CO2 29 22 - 32 mmol/L   Glucose, Bld 217 (H) 65 - 99 mg/dL   BUN 41 (H) 6 - 20 mg/dL   Creatinine, Ser 2.02 (H) 0.44 - 1.00 mg/dL   Calcium 8.8 (L) 8.9 - 10.3 mg/dL   GFR calc non Af Amer 25 (L) >60 mL/min   GFR calc Af Amer 28 (L) >60 mL/min    Comment: (NOTE) The eGFR has been calculated using the CKD EPI equation. This calculation has not been validated in all clinical situations. eGFR's persistently <60 mL/min signify possible Chronic Kidney Disease.    Anion gap 11 5 - 15    Comment: Performed at [facility], [address], [place], [place] [address]  Protime-INR     Status: Abnormal   Collection Time: 03/27/18  4:52 AM  Result Value Ref Range   Prothrombin Time 38.4 (H) 11.4 - 15.2 seconds   INR 3.96     Comment: Performed at [facility], [address], [place], [place] [address]  CBC     Status: Abnormal   Collection Time: 03/27/18  4:52 AM  Result Value Ref Range   WBC 8.4 4.0 - 10.5 K/uL   RBC 3.19 (L) 3.87 - 5.11 MIL/uL   Hemoglobin 8.3 (L) 12.0 - 15.0 g/dL   HCT 29.6 (L) 36.0 - 46.0 %   MCV 92.8 78.0 - 100.0 fL   MCH 26.0 26.0 - 34.0 pg   MCHC 28.0 (L) 30.0 - 36.0 g/dL   RDW 15.5 11.5 - 15.5 %   Platelets 279 150 - 400 K/uL    Comment: Performed at [facility], [address] [place], [place] [address]  Blood gas, arterial     Status: Abnormal    Collection Time: 03/27/18  8:57 AM  Result Value Ref Range   O2 Content 10.0 L/min   Delivery systems NASAL CANNULA    pH, Arterial 7.360 7.350 - 7.450   pCO2 arterial 54.9 (H) 32.0 - 48.0 mmHg   pO2, Arterial 60.8 (L) 83.0 - 108.0 mmHg   Bicarbonate 28.2 (H) 20.0 - 28.0 mmol/L   Acid-Base Excess 5.1 (H)  0.0 - 2.0 mmol/L   O2 Saturation 90.4 %   Patient temperature 98.0    Collection site RIGHT RADIAL    Drawn by [id number]    Sample type ARTERIAL DRAW    Allens test (pass/fail) PASS PASS    Comment: Performed at [facility], [address], [place], [place] [address]  Basic metabolic panel     Status: Abnormal   Collection Time: 03/28/18  5:20 AM  Result Value Ref Range   Sodium 133 (L) 135 - 145 mmol/L   Potassium 4.8 3.5 - 5.1 mmol/L   Chloride 93 (L) 101 - 111 mmol/L   CO2 30 22 - 32 mmol/L   Glucose, Bld 157 (H) 65 - 99 mg/dL   BUN 62 (H) 6 - 20 mg/dL   Creatinine, Ser 2.10 (H) 0.44 - 1.00 mg/dL   Calcium 9.0 8.9 - 10.3 mg/dL   GFR calc non Af Amer 23 (L) >60 mL/min   GFR calc Af Amer 27 (L) >60 mL/min    Comment: (NOTE) The eGFR has been calculated using the CKD EPI equation. This calculation has not been validated in all clinical situations. eGFR's persistently <60 mL/min signify possible Chronic Kidney Disease.    Anion gap 10 5 - 15    Comment: Performed at [facility], [address] [place], [place] [address]    Dg Chest Port 1 View  Result Date: 03/26/2018 CLINICAL DATA:  Respiratory failure EXAM: PORTABLE CHEST 1 VIEW COMPARISON:  March 24, 2018 FINDINGS: There is cardiomegaly with pulmonary vascular congestion and mild interstitial edema. There are small pleural effusions bilaterally. There is consolidation in both lower lobes medially. Patient is status post aortic valve replacement. No evident adenopathy. No bone lesions. There is aortic atherosclerosis. IMPRESSION: Pulmonary vascular congestion with suspected congestive heart failure given interstitial pulmonary  edema and pleural effusions bilaterally. Consolidation in both lower lobes is likely due to bilateral pneumonia, although there may be a degree of alveolar edema causing this opacity. Both entities may be present concurrently. Patient is status post aortic valve replacement. There is aortic atherosclerosis. Aortic Atherosclerosis (ICD10-I70.0). Electronically Signed   By: Lowella Grip III M.D.   On: 03/26/2018 13:57    Review of Systems  Constitutional: Positive for malaise/fatigue. Negative for chills and fever.  HENT: Positive for congestion.   Respiratory: Positive for shortness of breath.   Cardiovascular: Positive for leg swelling. Negative for chest pain and orthopnea.  Gastrointestinal: Negative for abdominal pain, nausea and vomiting.  Genitourinary: Negative for frequency and urgency.   Blood pressure (!) 89/63, pulse 77, temperature 98 F (36.7 C), temperature source Oral, resp. rate 14, height _0  (1.702 m), weight 93.2 kg (205 lb 7.5 oz), SpO2 98 %. Physical Exam  Constitutional: She is oriented to person, place, and time. No distress.  Eyes: No scleral icterus.  Neck: No JVD present.  Cardiovascular:  Murmur heard. Irregular rate and rhythm  Respiratory: She has no wheezes.  GI: She exhibits no distension. There is no tenderness.  Musculoskeletal:  She has trace edema.  She has chronic venous stasis changes in both legs  Neurological: She is alert and oriented to person, place, and time.    Assessment/Plan: 1] renal failure: Presently seems to be chronic.  Her creatinine was 1.13 on 01/29/2012 with EGFR of 52 cc/min.  Since then her creatinine was fluctuating.  Last creatinine on 03/19/2017 was 1.38 with EGFR of 39 cc/min hence stage III chronic renal failure.  Etiology could be  cardiorenal/ischemic.  At this moment other etiologies cannot be ruled out. 2] acute on chronic respiratory failure.  Presently she is on Lasix.  She is nonoliguric and feeling better. 3]  sleep apnea: She is on CPAP at home 4] history of aortic stenosis status post aortic valve replacement 5] history of chronic atrial fibrillation 6] anemia Plan: 1] we will check ANA, complement, hepatitis B surface antigen, hepatitis C antibody 2] we will do 24-hour urine for protein and immunoelectrophoresis 3] we will do ultrasound of the kidneys 4] we will check a renal panel in the morning.    [name] S 03/28/2018, 8:09 AM

## 2018-03-28 NOTE — Progress Notes (Signed)
ANTICOAGULATION CONSULT NOTE - follow up Force for warfarin Indication: atrial fibrillation  Patient Measurements: Height: 5\' 7"  (170.2 cm) Weight: 209 lb 7 oz (95 kg) IBW/kg (Calculated) : 61.6   Vital Signs: Temp: 98 F (36.7 C) (06/09 0757) Temp Source: Oral (06/09 0757) BP: 89/63 (06/09 0200) Pulse Rate: 77 (06/09 0230)  Labs: Recent Labs    03/26/18 0511 03/27/18 0452 03/28/18 0520  HGB 10.3* 8.3*  --   HCT 36.7 29.6*  --   PLT 269 279  --   LABPROT 23.6* 38.4* 43.8*  INR 2.13 3.96 4.69*  CREATININE 1.59* 2.02* 2.10*    Estimated Creatinine Clearance: 31.2 mL/min (A) (by C-G formula based on SCr of 2.1 mg/dL (H)).    Assessment:  Pharmacy consulted to dose warfarin for patient with atrial fibrillation. INR is still supratherapeutic at 4.69  Home dose is 2mg  on Tues, Thurs, Sat, Sunday; 3mg  on MWF  Goal of Therapy:  INR 2-3 Monitor platelets by anticoagulation protocol: Yes   Plan: No coumadin today Daily PT-INR Monitor for s/s of bleeding  Isac Sarna, BS [place], BCPS Clinical Pharmacist Pager [phone] 03/28/2018,8:57 AM

## 2018-03-29 LAB — RENAL FUNCTION PANEL
Albumin: 3.5 g/dL (ref 3.5–5.0)
Anion gap: 8 (ref 5–15)
BUN: 63 mg/dL — AB (ref 6–20)
CHLORIDE: 93 mmol/L — AB (ref 101–111)
CO2: 34 mmol/L — AB (ref 22–32)
CREATININE: 1.65 mg/dL — AB (ref 0.44–1.00)
Calcium: 9.1 mg/dL (ref 8.9–10.3)
GFR calc Af Amer: 36 mL/min — ABNORMAL LOW (ref >60)
GFR calc non Af Amer: 31 mL/min — ABNORMAL LOW (ref >60)
Glucose, Bld: 148 mg/dL — ABNORMAL HIGH (ref 65–99)
Phosphorus: 3.8 mg/dL (ref 2.5–4.6)
Potassium: 4.6 mmol/L (ref 3.5–5.1)
Sodium: 135 mmol/L (ref 135–145)

## 2018-03-29 LAB — BASIC METABOLIC PANEL
Anion gap: 10 (ref 5–15)
BUN: 64 mg/dL — AB (ref 6–20)
CALCIUM: 9.1 mg/dL (ref 8.9–10.3)
CO2: 33 mmol/L — ABNORMAL HIGH (ref 22–32)
Chloride: 92 mmol/L — ABNORMAL LOW (ref 101–111)
Creatinine, Ser: 1.66 mg/dL — ABNORMAL HIGH (ref 0.44–1.00)
GFR calc Af Amer: 36 mL/min — ABNORMAL LOW (ref >60)
GFR, EST NON AFRICAN AMERICAN: 31 mL/min — AB (ref >60)
GLUCOSE: 149 mg/dL — AB (ref 65–99)
Potassium: 4.7 mmol/L (ref 3.5–5.1)
SODIUM: 135 mmol/L (ref 135–145)

## 2018-03-29 LAB — CBC
HCT: 33.5 % — ABNORMAL LOW (ref 36.0–46.0)
Hemoglobin: 10.5 g/dL — ABNORMAL LOW (ref 12.0–15.0)
MCH: 28.4 pg (ref 26.0–34.0)
MCHC: 31.3 g/dL (ref 30.0–36.0)
MCV: 90.5 fL (ref 78.0–100.0)
PLATELETS: 269 10*3/uL (ref 150–400)
RBC: 3.7 MIL/uL — ABNORMAL LOW (ref 3.87–5.11)
RDW: 15.8 % — AB (ref 11.5–15.5)
WBC: 10 10*3/uL (ref 4.0–10.5)

## 2018-03-29 LAB — HEPATITIS C ANTIBODY

## 2018-03-29 LAB — HEPATITIS B SURFACE ANTIGEN: Hepatitis B Surface Ag: NEGATIVE

## 2018-03-29 LAB — PROTIME-INR
INR: 4.77
PROTHROMBIN TIME: 44.4 s — AB (ref 11.4–15.2)

## 2018-03-29 LAB — PROTEIN, URINE, 24 HOUR
Collection Interval-UPROT: 24 hours
URINE TOTAL VOLUME-UPROT: 3975 mL

## 2018-03-29 MED ORDER — DILTIAZEM HCL 30 MG PO TABS
30.0000 mg | ORAL_TABLET | Freq: Four times a day (QID) | ORAL | Status: DC
  Administered 2018-03-29 – 2018-03-30 (×5): 30 mg via ORAL
  Filled 2018-03-29 (×5): qty 1

## 2018-03-29 NOTE — Care Management (Addendum)
Patient requiring 6 liters, baseline 2 liters at home. Discussed with patient and husband again. They are still unsure of company providing oxygen, but feel that patients equipment can support 6 liters.  Husband to verify name on concentrator and bring name and number to CM tomorrow.   ADDENDUM: Patient has oxygen with [facility]. On 5 Liters currently. Home equipment will deliver 5 Liters. Discussed with Juliann Pulse of [facility] who will verify.

## 2018-03-29 NOTE — Progress Notes (Signed)
[name]  MRN: [id number]  DOB/AGE: 66/66/1952 66 y.o.  Primary Care Physician:Butler, Caren Griffins, DO  Admit date: 03/24/2018  Chief Complaint:  Chief Complaint  Patient presents with  . Shortness of Breath  . Hemoptysis    S-Pt presented on  03/24/2018 with  Chief Complaint  Patient presents with  . Shortness of Breath  . Hemoptysis  .    Pt offers no new complaints.  Meds . allopurinol  100 mg Oral Daily  . ALPRAZolam  0.25 mg Oral q morning - 10a  . bisoprolol  5 mg Oral Daily  . budesonide (PULMICORT) nebulizer solution  0.25 mg Nebulization BID  . calcium carbonate  1 tablet Oral BID WC  . diltiazem  30 mg Oral Q6H  . furosemide  60 mg Intravenous BID  . ipratropium  0.5 mg Nebulization TID  . levalbuterol  1.25 mg Nebulization TID  . levothyroxine  50 mcg Oral QAC breakfast  . loratadine  10 mg Oral Daily  . magnesium oxide  400 mg Oral QHS  . methylPREDNISolone (SOLU-MEDROL) injection  40 mg Intravenous Q12H  . multivitamin with minerals  1 tablet Oral Daily  . pantoprazole  40 mg Oral Daily  . rOPINIRole  1 mg Oral BID  . rosuvastatin  10 mg Oral QHS  . sodium polystyrene  30 g Oral Once  . Warfarin - Pharmacist Dosing Inpatient   Does not apply q1800       Physical Exam: Vital signs in last 24 hours: Temp:  [97.5 F (36.4 C)-97.6 F (36.4 C)] 97.5 F (36.4 C) (06/10 1228) Pulse Rate:  [79-93] 83 (06/10 0530) Resp:  [13-24] 21 (06/10 0530) BP: (98-117)/(68-86) 107/79 (06/10 1005) SpO2:  [91 %-96 %] 92 % (06/10 0753) Weight change:  Last BM Date: 03/28/18  Intake/Output from previous day: 06/09 0701 - 06/10 0700 In: 965 [P.O.:800; I.V.:165] Out: 1550 [Urine:1550] No intake/output data recorded.   Physical Exam: General- pt is awake,alert, oriented to time place and person Resp- No acute REsp distress, NO Rhonchi CVS- S1S2 irregular in rate and rhythm GIT- BS+, soft, NT, ND EXT- NO LE Edema, Cyanosis   Lab Results: CBC Recent Labs   03/27/18 0452 03/29/18 0505  WBC 8.4 10.0  HGB 8.3* 10.5*  HCT 29.6* 33.5*  PLT 279 269    BMET Recent Labs    03/28/18 0520 03/29/18 0505  NA 133* 135  135  K 4.8 4.6  4.7  CL 93* 93*  92*  CO2 30 34*  33*  GLUCOSE 157* 148*  149*  BUN 62* 63*  64*  CREATININE 2.10* 1.65*  1.66*  CALCIUM 9.0 9.1  9.1   Creat trend 2019 1.1=>2.1=>1.65 2018  1.2--1.3 2013   1.1  MICRO Recent Results (from the past 240 hour(s))  MRSA PCR Screening     Status: None   Collection Time: 03/25/18 12:38 AM  Result Value Ref Range Status   MRSA by PCR NEGATIVE NEGATIVE Final    Comment:        The GeneXpert MRSA Assay (FDA approved for NASAL specimens only), is one component of a comprehensive MRSA colonization surveillance program. It is not intended to diagnose MRSA infection nor to guide or monitor treatment for MRSA infections. Performed at [facility], [address], [place], [place] [address]       Lab Results  Component Value Date   CALCIUM 9.1 03/29/2018   CALCIUM 9.1 03/29/2018   PHOS 3.8 03/29/2018  Impression: 1)Renal  AKI secondary to ATN/Cardiorenal                AKI on CKD               CKD stage 3.               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               Hematuria none .                Nephrolithiasis Hx Absent   2)HTN  Medication- On Diuretics On Beta blockers   3)Anemia HGb at goal (9--11)   4)CKD Mineral-Bone Disorder  Phosphorus at goal. Calcium is at goal.  5)CHF-admitted with acure CHF excerbation PMD following  6)Electrolytes  Normokalemic  Hyponatremic    Now better  7)Acid base Co2 at goal     Plan:   Will continue current care    [place] S 03/29/2018, 1:23 PM

## 2018-03-29 NOTE — Progress Notes (Signed)
Pt requesting something for sleep tonight- states she has been waking up every night. Tylene Fantasia, NP paged and made aware. Waiting for call back/orders.

## 2018-03-29 NOTE — Progress Notes (Signed)
ANTICOAGULATION CONSULT NOTE - follow up [facility] for warfarin Indication: atrial fibrillation  Patient Measurements: Height: 5\' 7"  (170.2 cm) Weight: 209 lb 7 oz (95 kg) IBW/kg (Calculated) : 61.6   Vital Signs: Temp: 97.5 F (36.4 C) (06/10 0433) Temp Source: Oral (06/10 0433) BP: 117/86 (06/10 0500) Pulse Rate: 83 (06/10 0530)  Labs: Recent Labs    03/27/18 0452 03/28/18 0520 03/29/18 0505  HGB 8.3*  --  10.5*  HCT 29.6*  --  33.5*  PLT 279  --  269  LABPROT 38.4* 43.8* 44.4*  INR 3.96 4.69* 4.77*  CREATININE 2.02* 2.10* 1.65*  1.66*    Estimated Creatinine Clearance: 39.5 mL/min (A) (by C-G formula based on SCr of 1.66 mg/dL (H)).    Assessment:  Pharmacy consulted to dose warfarin for patient with atrial fibrillation. INR is still supratherapeutic at 4.77 Home dose is 2mg  on Tues, Thurs, Sat, Sunday; 3mg  on MWF  Goal of Therapy:  INR 2-3 Monitor platelets by anticoagulation protocol: Yes   Plan: No coumadin today Daily PT-INR Monitor for s/s of bleeding  Isac Sarna, BS Vena [place], BCPS Clinical Pharmacist Pager [phone] 03/29/2018,8:52 AM

## 2018-03-29 NOTE — Progress Notes (Signed)
Progress Note  Patient Name: [name] Date of Encounter: 03/29/2018  Primary Cardiologist: Dr. Satira Sark  Subjective   Dyspnea improved since Friday.  No chest pain or palpitations.  No active cough this morning.  Inpatient Medications    Scheduled Meds: . allopurinol  100 mg Oral Daily  . ALPRAZolam  0.25 mg Oral q morning - 10a  . bisoprolol  5 mg Oral Daily  . budesonide (PULMICORT) nebulizer solution  0.25 mg Nebulization BID  . calcium carbonate  1 tablet Oral BID WC  . furosemide  60 mg Intravenous BID  . ipratropium  0.5 mg Nebulization TID  . levalbuterol  1.25 mg Nebulization TID  . levothyroxine  50 mcg Oral QAC breakfast  . loratadine  10 mg Oral Daily  . magnesium oxide  400 mg Oral QHS  . methylPREDNISolone (SOLU-MEDROL) injection  40 mg Intravenous Q12H  . multivitamin with minerals  1 tablet Oral Daily  . pantoprazole  40 mg Oral Daily  . rOPINIRole  1 mg Oral BID  . rosuvastatin  10 mg Oral QHS  . sodium polystyrene  30 g Oral Once  . Warfarin - Pharmacist Dosing Inpatient   Does not apply q1800   Continuous Infusions: . diltiazem (CARDIZEM) infusion 2.5 mg/hr (03/29/18 0758)   PRN Meds: acetaminophen **OR** acetaminophen, benzonatate, levalbuterol, morphine injection, ondansetron **OR** ondansetron (ZOFRAN) IV   Vital Signs    Vitals:   03/29/18 0500 03/29/18 0530 03/29/18 0748 03/29/18 0753  BP: 117/86     Pulse: 87 83    Resp: (!) 24 (!) 21    Temp:      TempSrc:      SpO2: 92% 94% 92% 92%  Weight:      Height:        Intake/Output Summary (Last 24 hours) at 03/29/2018 0840 Last data filed at 03/29/2018 0500 Gross per 24 hour  Intake 965 ml  Output 1550 ml  Net -585 ml   Filed Weights   03/26/18 0500 03/27/18 0400 03/28/18 0848  Weight: 205 lb 7.5 oz (93.2 kg) 205 lb 7.5 oz (93.2 kg) 209 lb 7 oz (95 kg)    Telemetry    Atrial fibrillation.  Personally reviewed.  Physical Exam   GEN: No acute distress.   Neck:  No JVD. Cardiac:  Irregularly irregular, mechanical click in S2 with soft murmur, no gallop.  Respiratory:  Decreased at bases, prolonged expiratory phase. GI: Soft, nontender, bowel sounds present. MS:  Mild ankle edema; No deformity. Neuro:  Nonfocal. Psych: Alert and oriented x 3. Normal affect.  Labs    Chemistry Recent Labs  Lab 03/25/18 0531  03/27/18 0452 03/28/18 0520 03/29/18 0505  NA 140   < > 132* 133* 135  135  K 3.9   < > 4.3 4.8 4.6  4.7  CL 98*   < > 92* 93* 93*  92*  CO2 34*   < > 29 30 34*  33*  GLUCOSE 103*   < > 217* 157* 148*  149*  BUN 12   < > 41* 62* 63*  64*  CREATININE 1.26*   < > 2.02* 2.10* 1.65*  1.66*  CALCIUM 8.5*   < > 8.8* 9.0 9.1  9.1  PROT 7.2  --   --   --   --   ALBUMIN 3.5  --   --   --  3.5  AST 30  --   --   --   --  ALT 17  --   --   --   --   ALKPHOS 76  --   --   --   --   BILITOT 1.3*  --   --   --   --   GFRNONAA 43*   < > 25* 23* 31*  31*  GFRAA 50*   < > 28* 27* 36*  36*  ANIONGAP 8   < > 11 10 8  10    < > = values in this interval not displayed.     Hematology Recent Labs  Lab 03/26/18 0511 03/27/18 0452 03/29/18 0505  WBC 12.0* 8.4 10.0  RBC 3.77* 3.19* 3.70*  HGB 10.3* 8.3* 10.5*  HCT 36.7 29.6* 33.5*  MCV 97.3 92.8 90.5  MCH 27.3 26.0 28.4  MCHC 28.1* 28.0* 31.3  RDW 16.3* 15.5 15.8*  PLT 269 279 269    Cardiac Enzymes Recent Labs  Lab 03/24/18 1751 03/24/18 2354 03/25/18 0531  TROPONINI 0.03* 0.19* 0.14*   No results for input(s): TROPIPOC in the last 168 hours.   BNP Recent Labs  Lab 03/24/18 1754  BNP 336.0*     Radiology    US Renal  Result Date: 03/28/2018 CLINICAL DATA:  Renal failure. EXAM: RENAL / URINARY TRACT ULTRASOUND COMPLETE COMPARISON:  None. FINDINGS: Right Kidney: Length: 9.5 cm. Echogenicity within normal limits. Mild diffuse renal parenchymal atrophy. No mass or hydronephrosis visualized. Left Kidney: Length: 11.8 cm. Echogenicity within normal limits. No mass or  hydronephrosis visualized. Bladder: Empty with bladder catheter in place. IMPRESSION: No evidence of hydronephrosis. Small right kidney with mild diffuse parenchymal atrophy. Electronically Signed   By: Earle Gell M.D.   On: 03/28/2018 14:09    Cardiac Studies    Echocardiogram 03/25/2018: Study Conclusions  - Left ventricle: The cavity size was normal. Wall thickness was increased in a pattern of mild LVH. Systolic function was vigorous. The estimated ejection fraction was in the range of 65% to 70%. Wall motion was normal; there were no regional wall motion abnormalities. The study was not technically sufficient to allow evaluation of LV diastolic dysfunction due to atrial fibrillation. - Aortic valve: A [employer] mechanical prosthesis was present. There was trivial regurgitation. Mean gradient (S): 12 mm Hg. Valve area (VTI): 1.42 cm^2. - Mitral valve: Moderately calcified annulus. There was mild regurgitation. - Left atrium: The atrium was at the upper limits of normal in size. - Right atrium: Central venous pressure (est): 15 mm Hg. - Tricuspid valve: There was mild regurgitation. - Pulmonary arteries: PA peak pressure: 44 mm Hg (S). - Pericardium, extracardiac: There was no pericardial effusion.  Patient Profile     67 y.o. female with a history of chronic diastolic CHF, CAD, aortic stenosis with history ofSt. 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  Assessment & Plan    1.  Permanent atrial fibrillation.  Heart rate has improved on combination of bisoprolol and IV diltiazem.  She continues on Coumadin with management per pharmacy.  2.   Elevated troponin I at 0.03, 0.19, and 0.14.  Most consistent with demand ischemia rather than ACS.  3.  Acute on chronic hypoxic/hypercarbic respiratory failure.  She has had evaluation by Dr. Luan Pulling, continues on nebulizers and Pulmicort as well as  steroids.  Oxygen requirement has decreased.  4.  Acute on chronic diastolic heart failure.  Continues on IV Lasix with gentle diuresis, net of approximately 1200 cc over the weekend.  5.  Acute on chronic renal insufficiency.  Creatinine 2.1 down to 1.6.  I reviewed interval hospital course and discussed with the patient this morning.  Continue bisoprolol, transition from IV diltiazem to oral diltiazem 30 mg p.o. every 6 hours.  Continue IV Lasix for now, consider follow-up chest x-ray.  Increase activity as tolerated.  Follow-up BMET in a.m.  Signed, Rozann Lesches, MD  03/29/2018, 8:40 AM

## 2018-03-29 NOTE — Progress Notes (Signed)
Rn spoke with Dr. Baltazar Najjar who stated she did not want to give pt anything for sleep at this time because she did not want to sedate pt.  Pt made aware. Will continue to monitor

## 2018-03-29 NOTE — Progress Notes (Signed)
CRITICAL VALUE ALERT  Critical Value:  INR 4.11  Date & Time Notied: 03/29/2018 0725  Provider Notified: Manuella Ghazi  Orders Received/Actions taken: No new orders @ this time. Will continue to monitor

## 2018-03-29 NOTE — Progress Notes (Signed)
PROGRESS NOTE    [name]  XLK:[id number] DOB: Nov 14, 1950 DOA: 03/24/2018 PCP: Octavio Graves, DO   Brief Narrative:   [name] is a 67 y.o. 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  She has apparently not been wearing her home oxygen routinely and was admitted with acute diastolic CHF as well as atrial fibrillation with RVR and acute on chronic respiratory failure with hypercapnia.  Assessment & Plan:   Principal Problem:   Acute on chronic respiratory failure with hypercapnia (HCC) Active Problems:   Hyperlipidemia   Coronary atherosclerosis of native coronary artery   Anemia   Hypomagnesemia   Hypokalemia   Hypophosphatemia   Atrial fibrillation with RVR (HCC)   Elevated troponin   1. Acute on chronic respiratory failure with hypercapnia-improving.  Oxygen via nasal cannula has been titrated down to 6 L a day and will continue to wean to baseline of 2L.  Should improve further with ongoing diuresis.  Repeat CXR in am to re-evaluate. Continue nebulizer treatments and Pulmicort. Continue IV steroids. 2. Atrial fibrillation with improved rate control.  Transitioned to oral Cardizem per cardiology and also maintained on bisoprolol.  Warfarin currently being withheld due to supratherapeutic INR.  Will consider oral vitamin K by a.m. should this continue to increase further.  No overt signs of bleeding noted. 3. AKI on CKD stage III-improving.  Appreciate nephrology evaluation.  Continue current Lasix dose.  Creatinine is currently downtrending and good  urine output noted. 4. Hyperkalemia. Hold potassium supplementation and continue to follow. 5. Elevated troponin.  Likely secondary to demand ischemia with no chest pain noted.  2D echocardiogram with no acute findings. 6. Anxiety/agitation.  This has improved significantly and patient is tolerating lower dose of Xanax much better. 7. Dyslipidemia.  Continue Crestor. 8. CAD.  Continue beta-blocker, statin, warfarin.  No chest pain currently noted. 9. Anemia.  Continue to monitor with no overt bleeding currently noted especially while on anticoagulation.   DVT prophylaxis: On warfarin Code Status: Full code Family Communication: None at bedside Disposition Plan: Continue to work on improving cardiopulmonary status with ongoing diuresis and rate control as well as treatment for acute bronchospasms.   Consultants:   Cardiology  Pulmonology  Procedures:   None  Antimicrobials:   None   Subjective: Patient seen and evaluated today with improved dyspnea.  She is receiving breathing treatment.  Her oxygen remains at 6 L nasal cannula and she appears to be diuresing better with improvement in kidney function noted.  Objective: Vitals:   03/29/18 0748 03/29/18 0753 03/29/18 1005 03/29/18 1228  BP:   107/79   Pulse:      Resp:      Temp: (!) 97.5 F (36.4 C)   (!) 97.5 F (36.4 C)  TempSrc: Axillary   Axillary  SpO2: 92% 92%    Weight:      Height:        Intake/Output Summary (Last 24 hours) at 03/29/2018 1235 Last data filed at 03/29/2018 0500 Gross per 24 hour  Intake 405 ml  Output 1550 ml  Net -1145 ml   [employer]  03/26/18 0500 03/27/18 0400 03/28/18 0848  Weight: 93.2 kg (205 lb 7.5 oz) 93.2 kg (205 lb 7.5 oz) 95 kg (209 lb 7 oz)    Examination:  General exam: Appears calm and comfortable Respiratory system: Wheezing noted at bases on 6L  Cardiovascular system: S1 & S2 heard, irregular. No JVD, murmurs, rubs, gallops or clicks. No pedal  edema. Gastrointestinal system: Abdomen is nondistended, soft and nontender. No organomegaly or masses felt. Normal bowel sounds heard. Central nervous system: Alert and oriented. No focal neurological deficits. Extremities: Symmetric 5 x 5 power. Skin: No rashes, lesions or ulcers    Data Reviewed: I have personally reviewed following labs and imaging studies  CBC: Recent Labs  Lab 03/24/18 1751 03/25/18 0531 03/26/18 0511 03/27/18 0452 03/29/18 0505  WBC 9.1 9.0 12.0* 8.4 10.0  NEUTROABS 7.2 5.7  --   --   --   HGB 9.7* 9.6* 10.3* 8.3* 10.5*  HCT 32.7* 33.3* 36.7 29.6* 33.5*  MCV 93.2 94.3 97.3 92.8 90.5  PLT 220 235 269 279 811   Basic Metabolic Panel: Recent Labs  Lab 03/24/18 1751 03/25/18 0531 03/26/18 0511 03/27/18 0452 03/28/18 0520 03/29/18 0505  NA 141 140 136 132* 133* 135  135  K 3.2* 3.9 4.9 4.3 4.8 4.6  4.7  CL 99* 98* 93* 92* 93* 93*  92*  CO2 33* 34* 35* 29 30 34*  33*  GLUCOSE 113* 103* 113* 217* 157* 148*  149*  BUN 11 12 18  41* 62* 63*  64*  CREATININE 1.11* 1.26* 1.59* 2.02* 2.10* 1.65*  1.66*  CALCIUM 8.9 8.5* 8.6* 8.8* 9.0 9.1  9.1  MG 1.6*  --   --   --   --   --   PHOS 2.4*  --   --   --   --  3.8   GFR: Estimated Creatinine Clearance: 39.5 mL/min (A) (by C-G formula based on SCr of 1.66 mg/dL (H)). Liver Function Tests: Recent Labs  Lab 03/25/18 0531 03/29/18 0505  AST 30  --   ALT 17  --   ALKPHOS 76  --   BILITOT 1.3*  --   PROT 7.2  --   ALBUMIN 3.5 3.5   No results for input(s): LIPASE, AMYLASE in the last 168 hours. No results for input(s): AMMONIA in the last 168 hours. Coagulation Profile: Recent Labs  Lab 03/25/18 0531 03/26/18 0511 03/27/18 0452 03/28/18 0520 03/29/18 0505  INR 1.76 2.13 3.96 4.69* 4.77*   Cardiac Enzymes: Recent Labs  Lab 03/24/18 1751 03/24/18 2354 03/25/18 0531  TROPONINI 0.03* 0.19* 0.14*   BNP (last 3 results) No results for input(s): PROBNP in the last 8760  hours. HbA1C: No results for input(s): HGBA1C in the last 72 hours. CBG: No results for input(s): GLUCAP in the last 168 hours. Lipid Profile: No results for input(s): CHOL, HDL, LDLCALC, TRIG, CHOLHDL, LDLDIRECT in the last 72 hours. Thyroid Function Tests: No results for input(s): TSH, T4TOTAL, FREET4, T3FREE, THYROIDAB in the last 72 hours. Anemia Panel: No results for input(s): VITAMINB12, FOLATE, FERRITIN, TIBC, IRON, RETICCTPCT in the last 72 hours. Sepsis Labs: No results for input(s): PROCALCITON, LATICACIDVEN in the last 168 hours.  Recent Results (from the past 240 hour(s))  MRSA PCR Screening     Status: None   Collection Time: 03/25/18 12:38 AM  Result Value Ref Range Status   MRSA by PCR NEGATIVE NEGATIVE Final    Comment:        The GeneXpert  MRSA Assay (FDA approved for NASAL specimens only), is one component of a comprehensive MRSA colonization surveillance program. It is not intended to diagnose MRSA infection nor to guide or monitor treatment for MRSA infections. Performed at [facility], [address], [place], [place] [address]          Radiology Studies: US Renal  Result Date: 03/28/2018 CLINICAL DATA:  Renal failure. EXAM: RENAL / URINARY TRACT ULTRASOUND COMPLETE COMPARISON:  None. FINDINGS: Right Kidney: Length: 9.5 cm. Echogenicity within normal limits. Mild diffuse renal parenchymal atrophy. No mass or hydronephrosis visualized. Left Kidney: Length: 11.8 cm. Echogenicity within normal limits. No mass or hydronephrosis visualized. Bladder: Empty with bladder catheter in place. IMPRESSION: No evidence of hydronephrosis. Small right kidney with mild diffuse parenchymal atrophy. Electronically Signed   By: Earle Gell M.D.   On: 03/28/2018 14:09        Scheduled Meds: . allopurinol  100 mg Oral Daily  . ALPRAZolam  0.25 mg Oral q morning - 10a  . bisoprolol  5 mg Oral Daily  . budesonide (PULMICORT) nebulizer solution  0.25 mg Nebulization BID   . calcium carbonate  1 tablet Oral BID WC  . diltiazem  30 mg Oral Q6H  . furosemide  60 mg Intravenous BID  . ipratropium  0.5 mg Nebulization TID  . levalbuterol  1.25 mg Nebulization TID  . levothyroxine  50 mcg Oral QAC breakfast  . loratadine  10 mg Oral Daily  . magnesium oxide  400 mg Oral QHS  . methylPREDNISolone (SOLU-MEDROL) injection  40 mg Intravenous Q12H  . multivitamin with minerals  1 tablet Oral Daily  . pantoprazole  40 mg Oral Daily  . rOPINIRole  1 mg Oral BID  . rosuvastatin  10 mg Oral QHS  . sodium polystyrene  30 g Oral Once  . Warfarin - Pharmacist Dosing Inpatient   Does not apply q1800   Continuous Infusions:    LOS: 5 days    Time spent: 30 minutes    Amy Yellen Darleen Crocker, DO [facility] Pager [phone]  If 7PM-7AM, please contact night-coverage [email or web address] Password [facility] 03/29/2018, 12:35 PM

## 2018-03-30 ENCOUNTER — Inpatient Hospital Stay (HOSPITAL_COMMUNITY): Payer: Medicare HMO

## 2018-03-30 DIAGNOSIS — I25708 Atherosclerosis of coronary artery bypass graft(s), unspecified, with other forms of angina pectoris: Secondary | ICD-10-CM

## 2018-03-30 DIAGNOSIS — Z7901 Long term (current) use of anticoagulants: Secondary | ICD-10-CM

## 2018-03-30 DIAGNOSIS — E785 Hyperlipidemia, unspecified: Secondary | ICD-10-CM

## 2018-03-30 DIAGNOSIS — N179 Acute kidney failure, unspecified: Secondary | ICD-10-CM

## 2018-03-30 DIAGNOSIS — N183 Chronic kidney disease, stage 3 (moderate): Secondary | ICD-10-CM

## 2018-03-30 DIAGNOSIS — I5033 Acute on chronic diastolic (congestive) heart failure: Secondary | ICD-10-CM

## 2018-03-30 LAB — PROTIME-INR
INR: 3.43
Prothrombin Time: 34.3 seconds — ABNORMAL HIGH (ref 11.4–15.2)

## 2018-03-30 LAB — BASIC METABOLIC PANEL
ANION GAP: 10 (ref 5–15)
BUN: 58 mg/dL — ABNORMAL HIGH (ref 6–20)
CALCIUM: 8.8 mg/dL — AB (ref 8.9–10.3)
CHLORIDE: 92 mmol/L — AB (ref 101–111)
CO2: 35 mmol/L — AB (ref 22–32)
Creatinine, Ser: 1.43 mg/dL — ABNORMAL HIGH (ref 0.44–1.00)
GFR calc non Af Amer: 37 mL/min — ABNORMAL LOW (ref >60)
GFR, EST AFRICAN AMERICAN: 43 mL/min — AB (ref >60)
GLUCOSE: 132 mg/dL — AB (ref 65–99)
POTASSIUM: 4.5 mmol/L (ref 3.5–5.1)
Sodium: 137 mmol/L (ref 135–145)

## 2018-03-30 LAB — C4 COMPLEMENT: Complement C4, Body Fluid: 15 mg/dL (ref 14–44)

## 2018-03-30 LAB — ANTINUCLEAR ANTIBODIES, IFA: ANA Ab, IFA: NEGATIVE

## 2018-03-30 LAB — COMPLEMENT, TOTAL

## 2018-03-30 LAB — CBC
HEMATOCRIT: 33.6 % — AB (ref 36.0–46.0)
HEMOGLOBIN: 10.2 g/dL — AB (ref 12.0–15.0)
MCH: 27.5 pg (ref 26.0–34.0)
MCHC: 30.4 g/dL (ref 30.0–36.0)
MCV: 90.6 fL (ref 78.0–100.0)
Platelets: 249 10*3/uL (ref 150–400)
RBC: 3.71 MIL/uL — AB (ref 3.87–5.11)
RDW: 15.6 % — ABNORMAL HIGH (ref 11.5–15.5)
WBC: 9.1 10*3/uL (ref 4.0–10.5)

## 2018-03-30 LAB — C3 COMPLEMENT: C3 Complement: 173 mg/dL — ABNORMAL HIGH (ref 82–167)

## 2018-03-30 MED ORDER — DILTIAZEM HCL ER COATED BEADS 120 MG PO CP24
120.0000 mg | ORAL_CAPSULE | Freq: Every day | ORAL | Status: DC
  Administered 2018-03-30 – 2018-03-31 (×2): 120 mg via ORAL
  Filled 2018-03-30 (×2): qty 1

## 2018-03-30 MED ORDER — WARFARIN - PHARMACIST DOSING INPATIENT
Status: DC
  Administered 2018-03-31: 17:00:00

## 2018-03-30 NOTE — Progress Notes (Addendum)
Progress Note  Patient Name: [name] Date of Encounter: 03/30/2018  Primary Cardiologist: Rozann Lesches, MD   Subjective   Breathing improved and close to baseline by the patient's report. No chest pain or palpitations overnight.   Inpatient Medications    Scheduled Meds: . allopurinol  100 mg Oral Daily  . ALPRAZolam  0.25 mg Oral q morning - 10a  . bisoprolol  5 mg Oral Daily  . budesonide (PULMICORT) nebulizer solution  0.25 mg Nebulization BID  . calcium carbonate  1 tablet Oral BID WC  . diltiazem  30 mg Oral Q6H  . furosemide  60 mg Intravenous BID  . ipratropium  0.5 mg Nebulization TID  . levalbuterol  1.25 mg Nebulization TID  . levothyroxine  50 mcg Oral QAC breakfast  . loratadine  10 mg Oral Daily  . magnesium oxide  400 mg Oral QHS  . methylPREDNISolone (SOLU-MEDROL) injection  40 mg Intravenous Q12H  . multivitamin with minerals  1 tablet Oral Daily  . pantoprazole  40 mg Oral Daily  . rOPINIRole  1 mg Oral BID  . rosuvastatin  10 mg Oral QHS  . sodium polystyrene  30 g Oral Once  . Warfarin - Pharmacist Dosing Inpatient   Does not apply q1800   Continuous Infusions:  PRN Meds: acetaminophen **OR** acetaminophen, benzonatate, levalbuterol, morphine injection, ondansetron **OR** ondansetron (ZOFRAN) IV   Vital Signs    Vitals:   03/30/18 0755 03/30/18 0800 03/30/18 0900 03/30/18 1000  BP:  108/77 114/68   Pulse: 90 87 79 86  Resp: (!) 21 (!) 24 14 19   Temp: (!) 97.5 F (36.4 C)     TempSrc: Axillary     SpO2: 95% 93% 98% 95%  Weight:      Height:        Intake/Output Summary (Last 24 hours) at 03/30/2018 1058 Last data filed at 03/30/2018 0900 Gross per 24 hour  Intake 590 ml  Output 3825 ml  Net -3235 ml   Filed Weights   03/27/18 0400 03/28/18 0848 03/30/18 0500  Weight: 205 lb 7.5 oz (93.2 kg) 209 lb 7 oz (95 kg) 201 lb 8 oz (91.4 kg)    Telemetry    Atrial fibrillation, HR in 70's to 80's.  - Personally Reviewed  ECG    No new tracings.   Physical Exam   General: Well developed, well nourished Caucasian female appearing in no acute distress. Head: Normocephalic, atraumatic.  Neck: Supple without bruits, JVD not elevated. Lungs:  Resp regular and unlabored, CTA. Heart: Irregularly irregular, S1, S2, no S3, S4, or murmur; no rub. Crisp mechanical valve sounds appreciated.  Abdomen: Soft, non-tender, non-distended with normoactive bowel sounds. No hepatomegaly. No rebound/guarding. No obvious abdominal masses. Extremities: No clubbing or cyanosis, trace lower extremity edema. Distal pedal pulses are 2+ bilaterally. Neuro: Alert and oriented X 3. Moves all extremities spontaneously. Psych: Normal affect.  Labs    Chemistry Recent Labs  Lab 03/25/18 0531  03/28/18 0520 03/29/18 0505 03/30/18 0553  NA 140   < > 133* 135  135 137  K 3.9   < > 4.8 4.6  4.7 4.5  CL 98*   < > 93* 93*  92* 92*  CO2 34*   < > 30 34*  33* 35*  GLUCOSE 103*   < > 157* 148*  149* 132*  BUN 12   < > 62* 63*  64* 58*  CREATININE 1.26*   < > 2.10* 1.65*  1.66* 1.43*  CALCIUM 8.5*   < > 9.0 9.1  9.1 8.8*  PROT 7.2  --   --   --   --   ALBUMIN 3.5  --   --  3.5  --   AST 30  --   --   --   --   ALT 17  --   --   --   --   ALKPHOS 76  --   --   --   --   BILITOT 1.3*  --   --   --   --   GFRNONAA 43*   < > 23* 31*  31* 37*  GFRAA 50*   < > 27* 36*  36* 43*  ANIONGAP 8   < > 10 8  10 10    < > = values in this interval not displayed.     Hematology Recent Labs  Lab 03/27/18 0452 03/29/18 0505 03/30/18 0553  WBC 8.4 10.0 9.1  RBC 3.19* 3.70* 3.71*  HGB 8.3* 10.5* 10.2*  HCT 29.6* 33.5* 33.6*  MCV 92.8 90.5 90.6  MCH 26.0 28.4 27.5  MCHC 28.0* 31.3 30.4  RDW 15.5 15.8* 15.6*  PLT 279 269 249    Cardiac Enzymes Recent Labs  Lab 03/24/18 1751 03/24/18 2354 03/25/18 0531  TROPONINI 0.03* 0.19* 0.14*   No results for input(s): TROPIPOC in the last 168 hours.   BNP Recent Labs  Lab  03/24/18 1754  BNP 336.0*     DDimer No results for input(s): DDIMER in the last 168 hours.   Radiology    US Renal  Result Date: 03/28/2018 CLINICAL DATA:  Renal failure. EXAM: RENAL / URINARY TRACT ULTRASOUND COMPLETE COMPARISON:  None. FINDINGS: Right Kidney: Length: 9.5 cm. Echogenicity within normal limits. Mild diffuse renal parenchymal atrophy. No mass or hydronephrosis visualized. Left Kidney: Length: 11.8 cm. Echogenicity within normal limits. No mass or hydronephrosis visualized. Bladder: Empty with bladder catheter in place. IMPRESSION: No evidence of hydronephrosis. Small right kidney with mild diffuse parenchymal atrophy. Electronically Signed   By: Earle Gell M.D.   On: 03/28/2018 14:09   Dg Chest Port 1 View  Result Date: 03/30/2018 CLINICAL DATA:  Shortness of breath.  Asthma.  Atrial fibrillation. EXAM: PORTABLE CHEST 1 VIEW COMPARISON:  03/26/2018 FINDINGS: Artifact overlies the chest. Previous median sternotomy CABG and aortic valve replacement. Cardiomegaly as seen previously. Interstitial edema pattern, improved since the study of 4 days ago. Small amount pleural fluid mild atelectasis. IMPRESSION: Improved congestive heart failure pattern with less edema. Electronically Signed   By: Nelson Chimes M.D.   On: 03/30/2018 07:20    Cardiac Studies   Echocardiogram: 03/25/2018 Study Conclusions  - Left ventricle: The cavity size was normal. Wall thickness was   increased in a pattern of mild LVH. Systolic function was   vigorous. The estimated ejection fraction was in the range of 65%   to 70%. Wall motion was normal; there were no regional wall   motion abnormalities. The study was not technically sufficient to   allow evaluation of LV diastolic dysfunction due to atrial   fibrillation. - Aortic valve: A [employer] mechanical prosthesis was   present. There was trivial regurgitation. Mean gradient (S): 12   mm Hg. Valve area (VTI): 1.42 cm^2. - Mitral valve:  Moderately calcified annulus. There was mild   regurgitation. - Left atrium: The atrium was at the upper limits of normal in   size. - Right atrium: Central  venous pressure (est): 15 mm Hg. - Tricuspid valve: There was mild regurgitation. - Pulmonary arteries: PA peak pressure: 44 mm Hg (S). - Pericardium, extracardiac: There was no pericardial effusion.   Patient Profile     67 y.o. female w/ PMH of chronic diastolic CHF, CAD, aortic stenosis (s/pSt. 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  Assessment & Plan    1. Permanent atrial fibrillation - HR has overall been stable in the 70's to 80's since being transitioned from IV to PO Caridizem yesterday. She is currently on Bisoprolol 5mg  daily and PO Cardizem 30mg  Q6H. Will consolidate Cardizem to Cardizem CD 120mg  daily for improved compliance upon time of discharge.   - remains on Coumadin for anticoagulation. INR 3.43 this AM. Appreciate Pharmacy's assistance with dosing.   2. Elevated Troponin  - cyclic values peaked at 0.19 this admission. Values thought to be most consistent with demand ischemia in the setting of her acute illness. Echocardiogram shows a preserved EF of 65-70% with no regional WMA. No plans for further ischemic evaluation this admission.   3. Acute on chronic diastolic CHF - she has been on IV Lasix 60mg  BID and is overall -4.3L this admission. Creatinine continues to improve and repeat CXR this morning shows improvement in her CHF. Can likely transition back to PO Lasix tomorrow.   5.  Acute on Chronic Stage 3 CKD - creatinine peaked at 2.10 on 03/28/2018, improved to 1.43 this AM.  - Nephrology following.   6.  Deconditioning - patient reports overall worsening weakness over the past week as ambulation has been minimal since admission. Will order PT Evaluation prior to discharge to assess the need for Home Health PT.    For questions or updates, please contact [place] Please consult [email or web address] for contact info under Cardiology/STEMI.   Signed, Erma Heritage , PA-C 10:58 AM 03/30/2018 Pager: [phone]  The patient was seen and examined, and I agree with the history, physical exam, assessment and plan as documented above.  She is doing well this morning and feels much better with respect to breathing. Denies palpitations and chest pain. Will transition short-acting diltiazem to long-acting 120 mg daily for continued HR control. Anticoagulated with warfarin. LV systolic function and regional wall motion is normal. Currently on IV Lasix with over 4L output since hospitalization. Creatinine down to 1.43 (1.65 yesterday). She can likely be transitioned to oral diuretics on 03/31/18. I agree with the need for PT as she feels generally weak.  Kate Sable, MD, [facility]  03/30/2018 12:02 PM

## 2018-03-30 NOTE — Progress Notes (Signed)
Patient ambulated approximately 60 feet in hall without difficulty. Denies any SOB, sats at 95% upon completion.

## 2018-03-30 NOTE — Progress Notes (Signed)
1142 Patient now telemetry patient and is transferring to Dept 300 room# 330. Report given to receiving nurse Vita Barley, RN. Will move patient after she finishes eating her lunch tray.

## 2018-03-30 NOTE — Progress Notes (Signed)
ANTICOAGULATION CONSULT NOTE - follow up [place] for warfarin Indication: atrial fibrillation  Patient Measurements: Height: 5\' 7"  (170.2 cm) Weight: 201 lb 8 oz (91.4 kg) IBW/kg (Calculated) : 61.6   Vital Signs: Temp: 97.5 F (36.4 C) (06/11 0755) Temp Source: Axillary (06/11 0755) BP: 117/68 (06/11 1100) Pulse Rate: 81 (06/11 1132)  Labs: Recent Labs    03/28/18 0520 03/29/18 0505 03/30/18 0553  HGB  --  10.5* 10.2*  HCT  --  33.5* 33.6*  PLT  --  269 249  LABPROT 43.8* 44.4* 34.3*  INR 4.69* 4.77* 3.43  CREATININE 2.10* 1.65*  1.66* 1.43*   Estimated Creatinine Clearance: 44.9 mL/min (A) (by C-G formula based on SCr of 1.43 mg/dL (H)).  Assessment:  Pharmacy consulted to dose warfarin for patient with atrial fibrillation. INR is still supratherapeutic at 3.43 Home dose is 2mg  on Tues, Thurs, Sat, Sunday; 3mg  on MWF  Goal of Therapy:  INR 2-3 Monitor platelets by anticoagulation protocol: Yes   Plan: No coumadin today Monitor daily labs/INR and s/s of bleeding Daily PT/INR  Pricilla Larsson, [facility]  03/30/2018,11:54 AM

## 2018-03-30 NOTE — Progress Notes (Signed)
PROGRESS NOTE    [name]  WUJ:[id number] DOB: 12/22/1950 DOA: 03/24/2018 PCP: Octavio Graves, DO   Brief Narrative:   [name] is a 67 y.o. 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  She has apparently not been wearing her home oxygen routinely and was admitted with acute diastolic CHF as well as atrial fibrillation with RVR and acute on chronic respiratory failure with hypercapnia.  Her atrial fibrillation with RVR has now resolved and she is on oral Cardizem after cardiology evaluation.  She continues to have ongoing hypoxemia and will now come down to 4 L nasal cannula but continues to have ongoing diuresis with Lasix IV twice daily.  She is feeling better each day and is stable for transfer to telemetry today.  Assessment & Plan:   Principal Problem:   Acute on chronic respiratory failure with hypercapnia (HCC) Active Problems:   Hyperlipidemia   Coronary atherosclerosis of native coronary artery   Anemia   Hypomagnesemia   Hypokalemia   Hypophosphatemia   Atrial fibrillation with RVR (HCC)   Elevated troponin   1. Acute on chronic respiratory failure with hypoxemia/hypercapnia-improving.  Continue to wean down oxygen with good diuresis noted.  Repeat chest x-ray demonstrates improvement as well this morning.  Continue nebulizer treatments and Pulmicort. Continue IV steroids.  Patient still on Lasix 60 mg IV twice daily.  Anticipate discharge in a.m. if back to baseline. 2. Atrial fibrillation with improved rate control.  Transitioned to oral Cardizem  per cardiology and also maintained on bisoprolol.  INR still supratherapeutic, but much improved and being followed by pharmacy. 3. AKI on CKD stage III-improving.  Appreciate nephrology evaluation with recommendations to follow-up in clinic in 4 weeks.  Continue current Lasix IV dose with ongoing diuresis and improvement in kidney function noted.  Repeat renal panel in a.m. 4. Hyperkalemia. Hold potassium supplementation and continue to follow. 5. Elevated troponin.  Likely secondary to demand ischemia with no chest pain noted.  2D echocardiogram with no acute findings. 6. Anxiety/agitation.  This has improved significantly and patient is tolerating lower dose of Xanax much better. 7. Dyslipidemia.  Continue Crestor. 8. CAD.  Continue beta-blocker, statin, warfarin.  No chest pain currently noted. 9. Anemia-stable.  Continue to monitor with no overt bleeding currently noted especially while on anticoagulation.   DVT prophylaxis: On warfarin Code Status: Full code Family Communication: None at bedside Disposition Plan: Continue to work on diuresis and oxygen titration.  Currently on 4 L nasal cannula and usually at 2 L at home baseline.  Nearing discharge likely in a.m.  As for her to telemetry today.   Consultants:   Cardiology  Pulmonology  Nephrology  Procedures:   None  Antimicrobials:   None   Subjective: Patient seen and evaluated today with improved dyspnea.  She appears to have improvement each day and is having a net negative fluid balance with good urine output.  Chest x-ray demonstrates improvement as well.  Recommendations are to continue IV diuresis through today and wean oxygen levels further to usual baseline of 2L at home.  Objective: Vitals:   03/30/18 0755 03/30/18  0800 03/30/18 0900 03/30/18 1000  BP:  108/77 114/68   Pulse: 90 87 79 86  Resp: (!) 21 (!) 24 14 19   Temp: (!) 97.5 F (36.4 C)     TempSrc: Axillary     SpO2: 95% 93% 98% 95%  Weight:        Height:        Intake/Output Summary (Last 24 hours) at 03/30/2018 1111 Last data filed at 03/30/2018 0900 Gross per 24 hour  Intake 590 ml  Output 3825 ml  Net -3235 ml   Filed Weights   03/27/18 0400 03/28/18 0848 03/30/18 0500  Weight: 93.2 kg (205 lb 7.5 oz) 95 kg (209 lb 7 oz) 91.4 kg (201 lb 8 oz)    Examination:  General exam: Appears calm and comfortable Respiratory system: Wheezing noted at bases on 5L  Cardiovascular system: S1 & S2 heard, irregular. No JVD, murmurs, rubs, gallops or clicks. No pedal edema. Gastrointestinal system: Abdomen is nondistended, soft and nontender. No organomegaly or masses felt. Normal bowel sounds heard. Central nervous system: Alert and oriented. No focal neurological deficits. Extremities: Symmetric 5 x 5 power. Skin: No rashes, lesions or ulcers    Data Reviewed: I have personally reviewed following labs and imaging studies  CBC: Recent Labs  Lab 03/24/18 1751 03/25/18 0531 03/26/18 0511 03/27/18 0452 03/29/18 0505 03/30/18 0553  WBC 9.1 9.0 12.0* 8.4 10.0 9.1  NEUTROABS 7.2 5.7  --   --   --   --   HGB 9.7* 9.6* 10.3* 8.3* 10.5* 10.2*  HCT 32.7* 33.3* 36.7 29.6* 33.5* 33.6*  MCV 93.2 94.3 97.3 92.8 90.5 90.6  PLT 220 235 269 279 269 299   Basic Metabolic Panel: Recent Labs  Lab 03/24/18 1751  03/26/18 0511 03/27/18 0452 03/28/18 0520 03/29/18 0505 03/30/18 0553  NA 141   < > 136 132* 133* 135  135 137  K 3.2*   < > 4.9 4.3 4.8 4.6  4.7 4.5  CL 99*   < > 93* 92* 93* 93*  92* 92*  CO2 33*   < > 35* 29 30 34*  33* 35*  GLUCOSE 113*   < > 113* 217* 157* 148*  149* 132*  BUN 11   < > 18 41* 62* 63*  64* 58*  CREATININE 1.11*   < > 1.59* 2.02* 2.10* 1.65*  1.66* 1.43*  CALCIUM 8.9   < > 8.6* 8.8* 9.0 9.1  9.1 8.8*  MG 1.6*  --   --   --   --   --   --   PHOS 2.4*  --   --   --   --  3.8  --    < > = values in this interval not displayed.   GFR: Estimated Creatinine Clearance: 44.9 mL/min (A) (by C-G  formula based on SCr of 1.43 mg/dL (H)). Liver Function Tests: Recent Labs  Lab 03/25/18 0531 03/29/18 0505  AST 30  --   ALT 17  --   ALKPHOS 76  --   BILITOT 1.3*  --   PROT 7.2  --   ALBUMIN 3.5 3.5   No results for input(s): LIPASE, AMYLASE in the last 168 hours. No results for input(s): AMMONIA in the last 168 hours. Coagulation Profile: Recent Labs  Lab 03/26/18 0511 03/27/18 0452 03/28/18 0520 03/29/18 0505 03/30/18 0553  INR 2.13 3.96 4.69* 4.77* 3.43   Cardiac Enzymes: Recent Labs  Lab 03/24/18 1751 03/24/18 2354 03/25/18 0531  TROPONINI 0.03* 0.19* 0.14*   BNP (last 3 results) No results for input(s): PROBNP in the last 8760 hours. HbA1C: No results for input(s): HGBA1C in the last 72 hours. CBG: No results for input(s): GLUCAP in the last 168 hours. Lipid Profile: No results for input(s): CHOL, HDL, LDLCALC, TRIG, CHOLHDL, LDLDIRECT in the last 72 hours. Thyroid Function Tests: No results for input(s): TSH, T4TOTAL, FREET4, T3FREE, THYROIDAB in the last 72 hours. Anemia Panel: No results for input(s): VITAMINB12, FOLATE, FERRITIN, TIBC, IRON, RETICCTPCT in the last 72 hours. Sepsis Labs: No results for input(s): PROCALCITON, LATICACIDVEN in the last 168 hours.  Recent Results (from the past 240 hour(s))  MRSA PCR Screening     Status: None   Collection Time: 03/25/18 12:38 AM  Result Value Ref Range Status   MRSA by PCR NEGATIVE NEGATIVE Final    Comment:        The GeneXpert MRSA Assay (FDA approved for NASAL specimens only), is one component of a comprehensive MRSA colonization surveillance program. It is not intended to diagnose MRSA infection nor to guide or monitor treatment for MRSA infections. Performed at [facility], [address], [place], [place] [address]          Radiology Studies: US Renal  Result Date: 03/28/2018 CLINICAL DATA:  Renal failure. EXAM: RENAL / URINARY TRACT ULTRASOUND COMPLETE COMPARISON:  None.  FINDINGS: Right Kidney: Length: 9.5 cm. Echogenicity within normal limits. Mild diffuse renal parenchymal atrophy. No mass or hydronephrosis visualized. Left Kidney: Length: 11.8 cm. Echogenicity within normal limits. No mass or hydronephrosis visualized. Bladder: Empty with bladder catheter in place. IMPRESSION: No evidence of hydronephrosis. Small right kidney with mild diffuse parenchymal atrophy. Electronically Signed   By: Earle Gell M.D.   On: 03/28/2018 14:09   Dg Chest Port 1 View  Result Date: 03/30/2018 CLINICAL DATA:  Shortness of breath.  Asthma.  Atrial fibrillation. EXAM: PORTABLE CHEST 1 VIEW COMPARISON:  03/26/2018 FINDINGS: Artifact overlies the chest. Previous median sternotomy CABG and aortic valve replacement. Cardiomegaly as seen previously. Interstitial edema pattern, improved since the study of 4 days ago. Small amount pleural fluid mild atelectasis. IMPRESSION: Improved congestive heart failure pattern with less edema. Electronically Signed   By: Nelson Chimes M.D.   On: 03/30/2018 07:20        Scheduled Meds: . allopurinol  100 mg Oral Daily  . ALPRAZolam  0.25 mg Oral q morning - 10a  . bisoprolol  5 mg Oral Daily  . budesonide (PULMICORT) nebulizer solution  0.25 mg Nebulization BID  . calcium carbonate  1 tablet Oral BID WC  . diltiazem  30 mg Oral Q6H  . furosemide  60 mg Intravenous BID  . ipratropium  0.5 mg Nebulization TID  . levalbuterol  1.25 mg Nebulization TID  . levothyroxine  50 mcg Oral QAC breakfast  . loratadine  10 mg Oral Daily  . magnesium oxide  400 mg Oral QHS  . methylPREDNISolone (SOLU-MEDROL) injection  40 mg Intravenous Q12H  . multivitamin with minerals  1 tablet Oral Daily  . pantoprazole  40 mg Oral Daily  . rOPINIRole  1 mg Oral BID  . rosuvastatin  10 mg Oral QHS  . sodium polystyrene  30 g Oral Once  . Warfarin - Pharmacist Dosing Inpatient   Does not apply q1800   Continuous Infusions:    LOS: 6 days    Time spent: 30  minutes    Jesua Tamblyn Darleen Crocker, DO [facility] Pager  [phone]  If 7PM-7AM, please contact night-coverage [email or web address] Password [email or web address] 03/30/2018, 11:11 AM

## 2018-03-30 NOTE — Progress Notes (Signed)
Subjective: Interval History: has no complaint of nausea or vomiting.  She denies also any difficulty breathing..  Objective: Vital signs in last 24 hours: Temp:  [97.5 F (36.4 C)-98.3 F (36.8 C)] 97.5 F (36.4 C) (06/11 0755) Pulse Rate:  [75-90] 90 (06/11 0755) Resp:  [12-27] 21 (06/11 0755) BP: (97-120)/(62-82) 109/76 (06/11 0600) SpO2:  [92 %-98 %] 95 % (06/11 0755) Weight:  [91.4 kg (201 lb 8 oz)] 91.4 kg (201 lb 8 oz) (06/11 0500) Weight change: -3.6 kg (-7 lb 15 oz)  Intake/Output from previous day: 06/10 0701 - 06/11 0700 In: 790 [P.O.:790] Out: 1625 [Urine:1625] Intake/Output this shift: No intake/output data recorded.  General appearance: alert, cooperative and no distress Resp: clear to auscultation bilaterally Cardio: regular rate and rhythm, systolic murmur: holosystolic 4/6, blowing throughout the precordium and ejection click present Extremities: No edema  Lab Results: Recent Labs    03/29/18 0505 03/30/18 0553  WBC 10.0 9.1  HGB 10.5* 10.2*  HCT 33.5* 33.6*  PLT 269 249   BMET:  Recent Labs    03/29/18 0505 03/30/18 0553  NA 135  135 137  K 4.6  4.7 4.5  CL 93*  92* 92*  CO2 34*  33* 35*  GLUCOSE 148*  149* 132*  BUN 63*  64* 58*  CREATININE 1.65*  1.66* 1.43*  CALCIUM 9.1  9.1 8.8*   No results for input(s): PTH in the last 72 hours. Iron Studies: No results for input(s): IRON, TIBC, TRANSFERRIN, FERRITIN in the last 72 hours.  Studies/Results: US Renal  Result Date: 03/28/2018 CLINICAL DATA:  Renal failure. EXAM: RENAL / URINARY TRACT ULTRASOUND COMPLETE COMPARISON:  None. FINDINGS: Right Kidney: Length: 9.5 cm. Echogenicity within normal limits. Mild diffuse renal parenchymal atrophy. No mass or hydronephrosis visualized. Left Kidney: Length: 11.8 cm. Echogenicity within normal limits. No mass or hydronephrosis visualized. Bladder: Empty with bladder catheter in place. IMPRESSION: No evidence of hydronephrosis. Small right kidney  with mild diffuse parenchymal atrophy. Electronically Signed   By: Earle Gell M.D.   On: 03/28/2018 14:09   Dg Chest Port 1 View  Result Date: 03/30/2018 CLINICAL DATA:  Shortness of breath.  Asthma.  Atrial fibrillation. EXAM: PORTABLE CHEST 1 VIEW COMPARISON:  03/26/2018 FINDINGS: Artifact overlies the chest. Previous median sternotomy CABG and aortic valve replacement. Cardiomegaly as seen previously. Interstitial edema pattern, improved since the study of 4 days ago. Small amount pleural fluid mild atelectasis. IMPRESSION: Improved congestive heart failure pattern with less edema. Electronically Signed   By: Nelson Chimes M.D.   On: 03/30/2018 07:20    I have reviewed the patient's current medications.  Assessment/Plan: 1] acute kidney injury superimposed on chronic.  Presently her renal function is improving.  Her creatinine is one-point 1.43 .  Renal function has returned to her baseline.  She is with  stage III underlying chronic renal failure. 2] bone and mineral disorder: Her calcium and phosphorus is range 3] attention: Her blood pressure is reasonably controlled 4] anemia: Her hemoglobin is within our target goal 5] history of CHF: On Lasix.  She has a reasonable urine output.  Presently she is feeling much better. 6] history of atrial fibrillation her heart rate is controlled 7] history of aortic stenosis status post AVR Plan: 1] we will continue his present management 2] we will follow her as an outpatient in 4 weeks when she is discharged. 3] we will check a renal panel in the morning    LOS: 6 days  Janitza Revuelta S 03/30/2018,8:38 AM

## 2018-03-30 NOTE — Plan of Care (Signed)
Patient is alert and oriented x 4. Her vitals are stable. She is progressing towards all goals. Discussed with MD about removing foley. Patient is being weaned down from 6L to 4L high flow nasal cannula. Husband is at the bedside. Patient and family agree with plan of care. Will continue to monitor patient.

## 2018-03-30 NOTE — Care Management Note (Signed)
Case Management Note  Patient Details  Name: [name] MRN: [id number] Date of Birth: [birth year]  If discussed at Long Length of Stay Meetings, dates discussed:   03/30/2018 Additional Comments:  Jahsiah Carpenter, Chauncey Reading, RN 03/30/2018, 12:00 PM

## 2018-03-31 DIAGNOSIS — R5381 Other malaise: Secondary | ICD-10-CM

## 2018-03-31 DIAGNOSIS — E876 Hypokalemia: Secondary | ICD-10-CM

## 2018-03-31 LAB — RENAL FUNCTION PANEL
ALBUMIN: 3.2 g/dL — AB (ref 3.5–5.0)
ANION GAP: 10 (ref 5–15)
BUN: 55 mg/dL — ABNORMAL HIGH (ref 6–20)
CALCIUM: 8.7 mg/dL — AB (ref 8.9–10.3)
CO2: 38 mmol/L — ABNORMAL HIGH (ref 22–32)
Chloride: 92 mmol/L — ABNORMAL LOW (ref 101–111)
Creatinine, Ser: 1.35 mg/dL — ABNORMAL HIGH (ref 0.44–1.00)
GFR calc non Af Amer: 40 mL/min — ABNORMAL LOW (ref >60)
GFR, EST AFRICAN AMERICAN: 46 mL/min — AB (ref >60)
Glucose, Bld: 133 mg/dL — ABNORMAL HIGH (ref 65–99)
PHOSPHORUS: 4.8 mg/dL — AB (ref 2.5–4.6)
Potassium: 4.2 mmol/L (ref 3.5–5.1)
SODIUM: 140 mmol/L (ref 135–145)

## 2018-03-31 LAB — CBC
HCT: 34.3 % — ABNORMAL LOW (ref 36.0–46.0)
HEMOGLOBIN: 10.4 g/dL — AB (ref 12.0–15.0)
MCH: 27.7 pg (ref 26.0–34.0)
MCHC: 30.3 g/dL (ref 30.0–36.0)
MCV: 91.2 fL (ref 78.0–100.0)
Platelets: 243 10*3/uL (ref 150–400)
RBC: 3.76 MIL/uL — AB (ref 3.87–5.11)
RDW: 15.4 % (ref 11.5–15.5)
WBC: 9.5 10*3/uL (ref 4.0–10.5)

## 2018-03-31 LAB — PROTIME-INR
INR: 2.7
PROTHROMBIN TIME: 28.4 s — AB (ref 11.4–15.2)

## 2018-03-31 MED ORDER — FUROSEMIDE 40 MG PO TABS: 40.0000 mg | ORAL_TABLET | Freq: Two times a day (BID) | ORAL | 2 refills | Status: DC

## 2018-03-31 MED ORDER — BISOPROLOL FUMARATE 5 MG PO TABS: 5.0000 mg | ORAL_TABLET | Freq: Every day | ORAL | 3 refills | Status: DC

## 2018-03-31 MED ORDER — DILTIAZEM HCL ER COATED BEADS 120 MG PO CP24: 120.0000 mg | ORAL_CAPSULE | Freq: Every day | ORAL | 2 refills | Status: DC

## 2018-03-31 MED ORDER — FUROSEMIDE 40 MG PO TABS
40.0000 mg | ORAL_TABLET | Freq: Two times a day (BID) | ORAL | Status: DC
  Administered 2018-03-31: 40 mg via ORAL
  Filled 2018-03-31: qty 1

## 2018-03-31 MED ORDER — BUDESONIDE-FORMOTEROL FUMARATE 160-4.5 MCG/ACT IN AERO: 2.0000 | INHALATION_SPRAY | Freq: Two times a day (BID) | RESPIRATORY_TRACT | 3 refills | Status: DC

## 2018-03-31 MED ORDER — PREDNISONE 10 MG PO TABS: ORAL_TABLET | ORAL | 0 refills | Status: DC

## 2018-03-31 MED ORDER — WARFARIN SODIUM 2 MG PO TABS: 3.0000 mg | ORAL_TABLET | Freq: Once | ORAL | Status: DC

## 2018-03-31 MED ORDER — WARFARIN SODIUM 2 MG PO TABS
2.0000 mg | ORAL_TABLET | Freq: Once | ORAL | Status: AC
  Administered 2018-03-31: 2 mg via ORAL
  Filled 2018-03-31: qty 1

## 2018-03-31 NOTE — Progress Notes (Signed)
Progress Note  Patient Name: [name] Date of Encounter: 03/31/2018  Primary Cardiologist: Rozann Lesches, MD   Subjective   She feels well and less short of breath. Has been able to walk in the hallway. Denies chest pain.  Inpatient Medications    Scheduled Meds: . allopurinol  100 mg Oral Daily  . ALPRAZolam  0.25 mg Oral q morning - 10a  . bisoprolol  5 mg Oral Daily  . budesonide (PULMICORT) nebulizer solution  0.25 mg Nebulization BID  . calcium carbonate  1 tablet Oral BID WC  . diltiazem  120 mg Oral Daily  . furosemide  60 mg Intravenous BID  . ipratropium  0.5 mg Nebulization TID  . levalbuterol  1.25 mg Nebulization TID  . levothyroxine  50 mcg Oral QAC breakfast  . loratadine  10 mg Oral Daily  . magnesium oxide  400 mg Oral QHS  . methylPREDNISolone (SOLU-MEDROL) injection  40 mg Intravenous Q12H  . multivitamin with minerals  1 tablet Oral Daily  . pantoprazole  40 mg Oral Daily  . rOPINIRole  1 mg Oral BID  . rosuvastatin  10 mg Oral QHS  . warfarin  3 mg Oral Once  . Warfarin - Pharmacist Dosing Inpatient   Does not apply Q24H   Continuous Infusions:  PRN Meds: acetaminophen **OR** acetaminophen, benzonatate, levalbuterol, morphine injection, ondansetron **OR** ondansetron (ZOFRAN) IV   Vital Signs    Vitals:   03/31/18 0606 03/31/18 0854 03/31/18 0856 03/31/18 0906  BP: 106/71  120/89   Pulse: 73  92   Resp: 18     Temp: 98 F (36.7 C)  98.1 F (36.7 C)   TempSrc:   Oral   SpO2: 97% 95% 95% 97%  Weight:      Height:        Intake/Output Summary (Last 24 hours) at 03/31/2018 0918 Last data filed at 03/31/2018 0616 Gross per 24 hour  Intake -  Output 3250 ml  Net -3250 ml   Filed Weights   03/28/18 0848 03/30/18 0500 03/30/18 1806  Weight: 209 lb 7 oz (95 kg) 201 lb 8 oz (91.4 kg) 204 lb 2.3 oz (92.6 kg)    Telemetry    Atrial fibrillation, 80 bpm range - Personally Reviewed   Physical Exam   GEN: No acute distress.     Neck: No JVD Cardiac: Irregular rhythm, prosthetic valve sounds heard, no rubs or gallops.  Respiratory: End expiratory wheezes bilaterally, no crackles. GI: Soft, nontender, non-distended  MS: Trivial bilateral lower extremity edema with stasis dermatitis; No deformity. Neuro:  Nonfocal  Psych: Normal affect   Labs    Chemistry Recent Labs  Lab 03/25/18 0531  03/29/18 0505 03/30/18 0553 03/31/18 0503  NA 140   < > 135  135 137 140  K 3.9   < > 4.6  4.7 4.5 4.2  CL 98*   < > 93*  92* 92* 92*  CO2 34*   < > 34*  33* 35* 38*  GLUCOSE 103*   < > 148*  149* 132* 133*  BUN 12   < > 63*  64* 58* 55*  CREATININE 1.26*   < > 1.65*  1.66* 1.43* 1.35*  CALCIUM 8.5*   < > 9.1  9.1 8.8* 8.7*  PROT 7.2  --   --   --   --   ALBUMIN 3.5  --  3.5  --  3.2*  AST 30  --   --   --   --  ALT 17  --   --   --   --   ALKPHOS 76  --   --   --   --   BILITOT 1.3*  --   --   --   --   GFRNONAA 43*   < > 31*  31* 37* 40*  GFRAA 50*   < > 36*  36* 43* 46*  ANIONGAP 8   < > 8  10 10 10    < > = values in this interval not displayed.     Hematology Recent Labs  Lab 03/29/18 0505 03/30/18 0553 03/31/18 0503  WBC 10.0 9.1 9.5  RBC 3.70* 3.71* 3.76*  HGB 10.5* 10.2* 10.4*  HCT 33.5* 33.6* 34.3*  MCV 90.5 90.6 91.2  MCH 28.4 27.5 27.7  MCHC 31.3 30.4 30.3  RDW 15.8* 15.6* 15.4  PLT 269 249 243    Cardiac Enzymes Recent Labs  Lab 03/24/18 1751 03/24/18 2354 03/25/18 0531  TROPONINI 0.03* 0.19* 0.14*   No results for input(s): TROPIPOC in the last 168 hours.   BNP Recent Labs  Lab 03/24/18 1754  BNP 336.0*     DDimer No results for input(s): DDIMER in the last 168 hours.   Radiology    Dg Chest Port 1 View  Result Date: 03/30/2018 CLINICAL DATA:  Shortness of breath.  Asthma.  Atrial fibrillation. EXAM: PORTABLE CHEST 1 VIEW COMPARISON:  03/26/2018 FINDINGS: Artifact overlies the chest. Previous median sternotomy CABG and aortic valve replacement. Cardiomegaly as  seen previously. Interstitial edema pattern, improved since the study of 4 days ago. Small amount pleural fluid mild atelectasis. IMPRESSION: Improved congestive heart failure pattern with less edema. Electronically Signed   By: Nelson Chimes M.D.   On: 03/30/2018 07:20    Cardiac Studies   Echocardiogram: 03/25/2018 Study Conclusions  - Left ventricle: The cavity size was normal. Wall thickness was increased in a pattern of mild LVH. Systolic function was vigorous. The estimated ejection fraction was in the range of 65% to 70%. Wall motion was normal; there were no regional wall motion abnormalities. The study was not technically sufficient to allow evaluation of LV diastolic dysfunction due to atrial fibrillation. - Aortic valve: A [employer] mechanical prosthesis was present. There was trivial regurgitation. Mean gradient (S): 12 mm Hg. Valve area (VTI): 1.42 cm^2. - Mitral valve: Moderately calcified annulus. There was mild regurgitation. - Left atrium: The atrium was at the upper limits of normal in size. - Right atrium: Central venous pressure (est): 15 mm Hg. - Tricuspid valve: There was mild regurgitation. - Pulmonary arteries: PA peak pressure: 44 mm Hg (S). - Pericardium, extracardiac: There was no pericardial effusion.  Patient Profile     67 y.o. female w/ PMH of chronic diastolic CHF, CAD, aortic stenosis (s/pSt. 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  Assessment & Plan    1. Permanent atrial fibrillation: Symptomatically stable. HR controlled on bisoprolol 5 mg and long-acting diltiazem 120 mg daily. Anticoagulated with warfarin, INR 2.7  today.  2. Acute on chronic diastolic heart failure: CXR yesterday showed radiographic improvement. Symptomatically, she continues to improve. Diuresed over 5.4 L in last 24 hours. Will transition back to oral diuretics today, Lasix 40 mg bid.  3. Elevated troponin: Cyclic values  peaked at 0.19 this admission. Values thought to be most consistent with demand ischemia in the setting of her acute illness. Echocardiogram shows a preserved EF of 65-70% with no regional WMA. No plans for further ischemic evaluation this admission.   4. Acute on chronic CKD, stage III: Creatinine peaked at 2.10 on 03/28/2018, improved to 1.35 this AM. Nephrology following.   5. Deconditioning: PT evaluation ordered to assess need for home health/PT.      For questions or updates, please contact [place] Please consult [email or web address] for contact info under Cardiology/STEMI.      Signed, Kate Sable, MD  03/31/2018, 9:18 AM

## 2018-03-31 NOTE — Evaluation (Signed)
Physical Therapy Evaluation Patient Details Name: [name] MRN: [id number] DOB: 04-Mar-1951 Today's Date: 03/31/2018   History of Present Illness  [name] is a 67 y.o. 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  She stated that she is supposed to be on 2 L on home O2, but has not been wearing it.    Clinical Impression  Patient demonstrates slow slightly labored movement for sitting up at bedside requiring assistance to pull self up, tends to lean on nearby objects for support when not using quad cane, able to ambulate in hallway without loss of balance while on 2 LPM with O2 saturation between 90-91% and tolerated sitting up in chair with O2 saturations at 93-94% while on 2 LPM - RN notified.  Patient will benefit from continued physical therapy in hospital and recommended venue below to increase strength, balance, endurance for safe ADLs and gait.    Follow Up Recommendations Home health PT;Supervision for mobility/OOB    Equipment Recommendations  None recommended by PT    Recommendations for Other Services       Precautions / Restrictions Precautions Precautions: Fall Restrictions Weight Bearing Restrictions: No      Mobility  Bed Mobility Overal bed mobility: Needs Assistance Bed Mobility: Supine to Sit     Supine to sit: Min assist     General bed mobility comments: required assistance to help pull self up  Transfers Overall transfer level: Needs assistance Equipment used: None;Quad cane Transfers: Sit to/from [employer] Sit to Stand: Supervision Stand pivot transfers: Min guard          Ambulation/Gait Ambulation/Gait assistance: Supervision Ambulation Distance (Feet): 65 Feet Assistive device: Quad cane Gait Pattern/deviations: Decreased step length - right;Decreased step length - left;Decreased stride length Gait velocity: decreased   General Gait Details: demonstrated slow slightly labored cadence with mostly 2 point gait pattern using quad-cane without loss of balance, patient limited secondary to c/o fatigue  Stairs            Wheelchair Mobility    Modified Rankin (Stroke Patients Only)       Balance Overall balance assessment: Needs assistance Sitting-balance support: Feet supported;No upper extremity supported Sitting balance-Leahy Scale: Good     Standing balance support: Single extremity supported;During functional activity Standing balance-Leahy Scale: Fair                               Pertinent Vitals/Pain Pain Assessment: No/denies pain    Home Living Family/patient expects to be discharged to:: Private residence Living Arrangements: Spouse/significant other Available Help at Discharge: Family Type of Home: Mobile home Home Access: Stairs to enter Entrance Stairs-Rails: Right;Left;Can reach both Entrance Stairs-Number of Steps: 2 Home Layout: One level Home Equipment: Cane - single point;Shower seat      Prior Function Level of Independence: Independent         Comments: household and short distanced Estate agent Dominance   Dominant Hand: Right    Extremity/Trunk Assessment   Upper Extremity Assessment Upper Extremity Assessment: Generalized  weakness    Lower Extremity Assessment Lower Extremity Assessment: Generalized weakness    Cervical / Trunk Assessment Cervical / Trunk Assessment: Normal  Communication   Communication: No difficulties  Cognition Arousal/Alertness: Awake/alert Behavior  During Therapy: WFL for tasks assessed/performed Overall Cognitive Status: Within Functional Limits for tasks assessed                                        General Comments      Exercises     Assessment/Plan    PT Assessment Patient needs continued PT services  PT Problem List Decreased strength;Decreased activity tolerance;Decreased balance;Decreased mobility       PT Treatment Interventions Gait training;Stair training;Functional mobility training;Therapeutic activities;Therapeutic exercise;Patient/family education    PT Goals (Current goals can be found in the Care Plan section)  Acute Rehab PT Goals Patient Stated Goal: return home with family to assist PT Goal Formulation: With patient Time For Goal Achievement: 04/07/18 Potential to Achieve Goals: Good    Frequency Min 3X/week   Barriers to discharge        Co-evaluation               AM-PAC PT "6 Clicks" Daily Activity  Outcome Measure Difficulty turning over in bed (including adjusting bedclothes, sheets and blankets)?: None Difficulty moving from lying on back to sitting on the side of the bed? : A Little Difficulty sitting down on and standing up from a chair with arms (e.g., wheelchair, bedside commode, etc,.)?: None Help needed moving to and from a bed to chair (including a wheelchair)?: A Little Help needed walking in hospital room?: A Little Help needed climbing 3-5 steps with a railing? : A Little 6 Click Score: 20    End of Session Equipment Utilized During Treatment: Gait belt Activity Tolerance: Patient tolerated treatment well;Patient limited by fatigue Patient left: in chair;with call bell/phone within reach Nurse Communication: Mobility status;Other (comment)(RN notified that patient left up in chair) PT Visit Diagnosis: Unsteadiness on feet (R26.81);Other abnormalities of gait and mobility (R26.89);Muscle weakness (generalized) (M62.81)    Time: 2423-5361 PT Time  Calculation (min) (ACUTE ONLY): 32 min   Charges:   PT Evaluation $PT Eval Moderate Complexity: 1 Mod PT Treatments $Therapeutic Activity: 23-37 mins   PT G Codes:        9:29 AM, April 07, 2018 Lonell Grandchild, MPT Physical Therapist with [facility] [phone] office [phone] mobile phone

## 2018-03-31 NOTE — Care Management Note (Addendum)
Case Management Note  Patient Details  Name: [name] MRN: [id number] Date of Birth: [birth year]   Expected Discharge Date:  03/29/18               Expected Discharge Plan:  [place]  In-House Referral:  NA  Discharge planning Services  CM Consult  Post Acute Care Choice:  Home Health Choice offered to:  Patient  HH Arranged:  RN, PT [place] Agency:  [facility]  Status of Service:  Completed, signed off  If discussed at [employer] of Stay Meetings, dates discussed:    Additional Comments: DC home today with [place] and PT. Pt has chosen [facility] from [employer] [facility] providers. Pt aware HH has 48 hrs to make first visit. [facility] rep, Verdis Frederickson, aware of referral and will pull pt info from chart.   Addendum: [facility] unable to accept [employer], pt aware and would like [place] as second choice, Juliann Pulse, [facility] rep, aware of referral.   Sherald Barge, RN 03/31/2018, 2:40 PM

## 2018-03-31 NOTE — Care Management Important Message (Signed)
Important Message  Patient Details  Name: [name] MRN: [id number] Date of Birth: Mar 16, 1951   Medicare Important Message Given:  Yes    Shelda Altes 03/31/2018, 11:54 AM

## 2018-03-31 NOTE — Progress Notes (Signed)
Pt's IV catheter removed and intact. Pt's IV site clean dry and intact. Discharge instructions including medications and follow up appointments were reviewed and discussed with patient. All questions were answered and no further questions at this time. Pt escorted by nurse tech

## 2018-03-31 NOTE — Plan of Care (Signed)
  Problem: Clinical Measurements: Goal: Will remain free from infection Outcome: Progressing Goal: Diagnostic test results will improve Outcome: Progressing Goal: Respiratory complications will improve Outcome: Progressing Goal: Cardiovascular complication will be avoided Outcome: Progressing   Problem: Activity: Goal: Risk for activity intolerance will decrease Outcome: Progressing   Problem: Nutrition: Goal: Adequate nutrition will be maintained Outcome: Progressing   Problem: Coping: Goal: Level of anxiety will decrease Outcome: Progressing   Problem: Safety: Goal: Ability to remain free from injury will improve Outcome: Progressing   Problem: Skin Integrity: Goal: Risk for impaired skin integrity will decrease Outcome: Progressing

## 2018-03-31 NOTE — Progress Notes (Signed)
[name]  MRN: [id number]  DOB/AGE: [birth year] 67 y.o.  Primary Care Physician:Butler, Caren Griffins, DO  Admit date: 03/24/2018  Chief Complaint:  Chief Complaint  Patient presents with  . Shortness of Breath  . Hemoptysis    S-Pt presented on  03/24/2018 with  Chief Complaint  Patient presents with  . Shortness of Breath  . Hemoptysis  .    Pt offers no new complaints.Pt main concern was " Do you think I can go home today?"  Meds . allopurinol  100 mg Oral Daily  . ALPRAZolam  0.25 mg Oral q morning - 10a  . bisoprolol  5 mg Oral Daily  . budesonide (PULMICORT) nebulizer solution  0.25 mg Nebulization BID  . calcium carbonate  1 tablet Oral BID WC  . diltiazem  120 mg Oral Daily  . furosemide  40 mg Oral BID  . ipratropium  0.5 mg Nebulization TID  . levalbuterol  1.25 mg Nebulization TID  . levothyroxine  50 mcg Oral QAC breakfast  . loratadine  10 mg Oral Daily  . magnesium oxide  400 mg Oral QHS  . methylPREDNISolone (SOLU-MEDROL) injection  40 mg Intravenous Q12H  . multivitamin with minerals  1 tablet Oral Daily  . pantoprazole  40 mg Oral Daily  . rOPINIRole  1 mg Oral BID  . rosuvastatin  10 mg Oral QHS  . warfarin  3 mg Oral Once  . Warfarin - Pharmacist Dosing Inpatient   Does not apply Q24H       Physical Exam: Vital signs in last 24 hours: Temp:  [98 F (36.7 C)-98.2 F (36.8 C)] 98.1 F (36.7 C) (06/12 0856) Pulse Rate:  [73-92] 92 (06/12 0856) Resp:  [17-28] 18 (06/12 0606) BP: (106-120)/(68-89) 120/89 (06/12 0856) SpO2:  [92 %-97 %] 97 % (06/12 0906) Weight:  [204 lb 2.3 oz (92.6 kg)] 204 lb 2.3 oz (92.6 kg) (06/11 1806) Weight change: 2 lb 10.3 oz (1.2 kg) Last BM Date: 03/30/18  Intake/Output from previous day: 06/11 0701 - 06/12 0700 In: -  Out: 5450 [Urine:5450] No intake/output data recorded.   Physical Exam: General- pt is awake,alert, oriented to time place and person Resp- No acute REsp distress, NO Rhonchi CVS- S1S2  irregular in rate and rhythm GIT- BS+, soft, NT, ND EXT- NO LE Edema, Cyanosis   Lab Results: CBC Recent Labs    03/30/18 0553 03/31/18 0503  WBC 9.1 9.5  HGB 10.2* 10.4*  HCT 33.6* 34.3*  PLT 249 243    BMET Recent Labs    03/30/18 0553 03/31/18 0503  NA 137 140  K 4.5 4.2  CL 92* 92*  CO2 35* 38*  GLUCOSE 132* 133*  BUN 58* 55*  CREATININE 1.43* 1.35*  CALCIUM 8.8* 8.7*   Creat trend 2019 1.1=>2.1=>1.65=>1.43=>1.35 2018  1.2--1.3 2013   1.1  MICRO Recent Results (from the past 240 hour(s))  MRSA PCR Screening     Status: None   Collection Time: 03/25/18 12:38 AM  Result Value Ref Range Status   MRSA by PCR NEGATIVE NEGATIVE Final    Comment:        The GeneXpert MRSA Assay (FDA approved for NASAL specimens only), is one component of a comprehensive MRSA colonization surveillance program. It is not intended to diagnose MRSA infection nor to guide or monitor treatment for MRSA infections. Performed at [facility], [address] [place], [place] [address]       Lab Results  Component Value Date  CALCIUM 8.7 (L) 03/31/2018   PHOS 4.8 (H) 03/31/2018           Impression: 1)Renal  AKI secondary to ATN/Cardiorenal                AKI on CKD               CKD stage 3.               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 Calcium is at goal.  5)CHF-admitted with acure CHF excerbation PMD following  6)Electrolytes  Normokalemic  Hyponatremic    Now better  7)Acid base Co2 at goal     Plan:   Will continue current care    [place] S 03/31/2018, 9:32 AM

## 2018-03-31 NOTE — Plan of Care (Signed)
  Problem: Acute Rehab PT Goals(only PT should resolve) Goal: Pt Will Go Supine/Side To Sit Outcome: Progressing Flowsheets (Taken 03/31/2018 0930) Pt will go Supine/Side to Sit: with supervision Goal: Patient Will Transfer Sit To/From Stand Outcome: Progressing Flowsheets (Taken 03/31/2018 0930) Patient will transfer sit to/from stand: with modified independence Goal: Pt Will Transfer Bed To Chair/Chair To Bed Outcome: Progressing Flowsheets (Taken 03/31/2018 0930) Pt will Transfer Bed to Chair/Chair to Bed: with modified independence Goal: Pt Will Ambulate Outcome: Progressing Flowsheets (Taken 03/31/2018 0930) Pt will Ambulate: 100 feet;with supervision;with cane   9:31 AM, 03/31/18 Lonell Grandchild, MPT Physical Therapist with [facility] [phone] office [phone] mobile phone

## 2018-03-31 NOTE — Progress Notes (Addendum)
ANTICOAGULATION CONSULT NOTE - follow up [place] for warfarin Indication: atrial fibrillation  Patient Measurements: Height: 5\' 7"  (170.2 cm) Weight: 204 lb 2.3 oz (92.6 kg) IBW/kg (Calculated) : 61.6   Vital Signs: Temp: 98 F (36.7 C) (06/12 0606) BP: 106/71 (06/12 0606) Pulse Rate: 73 (06/12 0606)  Labs: Recent Labs    03/29/18 0505 03/30/18 0553 03/31/18 0503  HGB 10.5* 10.2* 10.4*  HCT 33.5* 33.6* 34.3*  PLT 269 249 243  LABPROT 44.4* 34.3* 28.4*  INR 4.77* 3.43 2.70  CREATININE 1.65*  1.66* 1.43* 1.35*   Estimated Creatinine Clearance: 47.9 mL/min (A) (by C-G formula based on SCr of 1.35 mg/dL (H)).  Assessment:  Pharmacy consulted to dose warfarin for patient with atrial fibrillation. INR is now therapeutic at 2.70 Home dose is 2mg  on Tues, Thurs, Sat, Sunday; 3mg  on MWF  Goal of Therapy:  INR 2-3 Monitor platelets by anticoagulation protocol: Yes   Plan: Coumadin 2 mg x 1 dose Monitor daily labs/INR and s/s of bleeding Daily PT/INR   Margot Ables, PharmD Clinical Pharmacist 03/31/2018 8:01 AM

## 2018-03-31 NOTE — Progress Notes (Signed)
Patient discharged from unit at 1800 in wheelchair and on 2L of oxygen via [place]. Patient had no complaints of pain or s/s of distress. Vitals stable and pt accompanied by husband. Pt verbalized understanding of discharge instructions using the teachback method.

## 2018-03-31 NOTE — Discharge Summary (Signed)
Physician Discharge Summary  [name]:[id number] DOB: [birth year] DOA: 03/24/2018  PCP: Octavio Graves, DO  Admit date: 03/24/2018 Discharge date: 03/31/2018  Time spent: 35 minutes  Recommendations for Outpatient Follow-up:  1. Repeat basic metabolic panel to follow electrolytes and renal function 2. Repeat CBC to follow Hgb trend    Discharge Diagnoses:  Principal Problem:   Acute on chronic respiratory failure with hypercapnia (HCC) Active Problems:   Hyperlipidemia   Coronary atherosclerosis of native coronary artery   Chronic anticoagulation   Acute on chronic diastolic congestive heart failure (HCC)   Anemia   Hypomagnesemia   Hypokalemia   Hypophosphatemia   Atrial fibrillation with RVR (HCC)   Elevated troponin   Coronary artery disease of bypass graft of native heart with stable angina pectoris (HCC)   Acute renal failure superimposed on stage 3 chronic kidney disease ([place])   Physical deconditioning   Discharge Condition: stable and improved. Discharge home with instructions to follow up with PCP, cardiology service and nephrology   Diet recommendation: heart healthy   Filed Weights   03/28/18 0848 03/30/18 0500 03/30/18 1806  Weight: 95 kg (209 lb 7 oz) 91.4 kg (201 lb 8 oz) 92.6 kg (204 lb 2.3 oz)    History of present illness:  As per H&P written by Dr. Olevia Bowens on 03/24/18 67 y.o. 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  She stated that she is supposed to be on 2 L on home O2, but has not been wearing it.  ED Course: Initial vital  signs temperature 99.9 F pulse 117, respirations 26, blood pressure 112/67 mmHg and O2 sat 89% on room air.  The patient was given supplemental oxygen, 80 mg of furosemide and 3 separate doses of lorazepam 1 mg IVP due to anxiety.  The Patient was subsequently started on BiPAP ventilation, but was still anxious, tachypneic and restless. I order 2 mg of Morphine, which helped, but the patient asked for the mass to be taken off.  She is currently tolerating nasal cannula oxygen at 3 LPM.  Hospital Course:  1-acute on chronic respiratory failure with hypoxemia/hypercapnia: in the setting of acute on chronic diastolic HF and COPD exacerbation. -patient with excellent diuresis while using IV lasix -now discharge on lasix 40mg  BID -advise to check weight on daily basis, to follow low sodium diet and will follow up with PCP in 10 days. -will also discharge on steroids for 5 days, pulmicort BID and PRN albuterol. -continue home oxygen supplementation   2-atrial fibrillation with RVR: -improved rate control  -patient to be discharge on bisoprolol and diltiazem  -continue coumadin  -follow up with cardiology service   3-AKI and CKD stage 3: -Improved with diuresis and close to baseline  -renal service will follow her up in 4 weeks   4-Hyperkalemia  -resolved  -repeat BMET at follow up visit to reassess renal function   5-elevated troponin: due to demand ischemia and acute on chronic renal failure -no CP -echo reassuring  -outpatient follow up with cardiology service   6-HLD -continue statins   7-CAD -continue b-blocker, statin  -no CP currently  8-anemia of chronic disease -no signs of acute bleeding  -Hgb stable   Procedures: -See below for x-ray reports  -Echocardiogram: 03/25/2018 Study Conclusions - Left ventricle: The cavity size was normal. Wall thickness was increased in a pattern of mild LVH. Systolic function was vigorous. The estimated ejection fraction was in the  range of 65% to 70%. Wall motion was normal; there were no regional wall motion abnormalities. The study was not technically sufficient to allow evaluation of LV diastolic dysfunction due to atrial fibrillation. - Aortic valve: A [employer] mechanical prosthesis was present. There was trivial regurgitation. Mean gradient (S): 12 mm Hg. Valve area (VTI): 1.42 cm^2. - Mitral valve: Moderately calcified annulus. There was mild regurgitation. - Left atrium: The atrium was at the upper limits of normal in size. - Right atrium: Central venous pressure (est): 15 mm Hg. - Tricuspid valve: There was mild regurgitation. - Pulmonary arteries: PA peak pressure: 44 mm Hg (S). - Pericardium, extracardiac: There was no pericardial effusion.     Consultations:  Cardiology   Discharge Exam: Vitals:   03/31/18 1444 03/31/18 1451  BP:  111/72  Pulse:  92  Resp:  18  Temp:    SpO2: 91% 96%    General: No fever, no chest pain, breathing back to her baseline; reports good urine output and is in no acute distress. Good urine output. Cardiovascular: No JVD, irregular rhythm, positive prosthetic bowel sounds appreciated on exam; no rubs, no gallops. Respiratory: No frank crackles, mild expiratory wheezes, improved air movement bilaterally, no using accessory muscles. Abdomen: soft, NT, ND, positive BS Extremities: no cyanosis, no clubbing; trace edema bilaterally   Discharge Instructions   Discharge Instructions    (HEART FAILURE PATIENTS) Call MD:  Anytime you have any of the following symptoms: 1) 3 pound weight gain in 24 hours or 5 pounds in 1 week 2) shortness of breath, with or without a dry hacking cough 3) swelling in the hands, feet or stomach 4) if you have to sleep on extra pillows at night in order to breathe.   Complete by:  As directed    Diet - low sodium heart healthy   Complete by:  As directed    Discharge instructions   Complete by:  As directed     Medications as prescribed Check your weight on daily basis (pay attention to weight gain of more than 3 pounds overnight and/or more than 5 pounds in a week) Follow-up low-sodium diet (less than 2000 mg of sodium in 24 hours) Follow-up with PCP in 10 days Follow-up with cardiology service as instructed     Allergies as of 03/31/2018      Reactions   Penicillins Hives, Itching, Swelling, Rash   Has patient had a PCN reaction causing immediate rash, facial/tongue/throat swelling, SOB or lightheadedness with hypotension: Yes Has patient had a PCN reaction causing severe rash involving mucus membranes or skin necrosis: Yes Has patient had a PCN reaction that required hospitalization: unknown Has patient had a PCN reaction occurring within the last 10 years: No If all of the above answers are "NO", then may proceed with Cephalosporin use. Throat swelling      Medication List    STOP taking these medications   enoxaparin 100 MG/ML injection Commonly known as:  LOVENOX   HYDROcodone-homatropine 5-1.5 MG/5ML syrup Commonly known as:  HYCODAN   metoprolol succinate 50 MG 24 hr tablet Commonly known as:  TOPROL-XL     TAKE these medications  albuterol 108 (90 Base) MCG/ACT inhaler Commonly known as:  PROVENTIL HFA;VENTOLIN HFA Inhale 1-2 puffs into the lungs every 6 (six) hours as needed for wheezing or shortness of breath.   allopurinol 100 MG tablet Commonly known as:  ZYLOPRIM Take 100 mg by mouth daily.   ALPRAZolam 1 MG tablet Commonly known as:  XANAX Take 1 mg by mouth every morning. *May take one tablet two times daily as needed for anxiety*   benzonatate 200 MG capsule Commonly known as:  TESSALON Take 1 capsule (200 mg total) by mouth 2 (two) times daily as needed for cough. What changed:    when to take this  additional instructions   bisoprolol 5 MG tablet Commonly known as:  ZEBETA Take 1 tablet (5 mg total) by mouth daily. Start taking on:  04/01/2018    budesonide-formoterol 160-4.5 MCG/ACT inhaler Commonly known as:  SYMBICORT Inhale 2 puffs into the lungs 2 (two) times daily.   calcium carbonate 600 MG Tabs tablet Commonly known as:  OS-CAL Take 600 mg by mouth 2 (two) times daily.   cetirizine 10 MG tablet Commonly known as:  ZYRTEC Take 10 mg by mouth daily.   clobetasol cream 0.05 % Commonly known as:  TEMOVATE Apply 1 application topically 2 (two) times daily as needed (FOR SKIN IRRITATION/REDNESS & BLISTERING ON LEGS).   CRESTOR 10 MG tablet Generic drug:  rosuvastatin Take 10 mg by mouth at bedtime.   cyclobenzaprine 10 MG tablet Commonly known as:  FLEXERIL Take 10 mg by mouth 3 (three) times daily as needed for muscle spasms.   diltiazem 120 MG 24 hr capsule Commonly known as:  CARDIZEM CD Take 1 capsule (120 mg total) by mouth daily. Start taking on:  04/01/2018   fluconazole 100 MG tablet Commonly known as:  DIFLUCAN Take 100 mg by mouth daily.   furosemide 40 MG tablet Commonly known as:  LASIX Take 1 tablet (40 mg total) by mouth 2 (two) times daily.   HYDROcodone-acetaminophen 10-325 MG tablet Commonly known as:  NORCO Take 1 tablet by mouth at bedtime. *May take one tablet every 12 hours as needed for pain*   levothyroxine 50 MCG tablet Commonly known as:  SYNTHROID, LEVOTHROID Take 50 mcg by mouth daily before breakfast.   magnesium oxide 400 MG tablet Commonly known as:  MAG-OX Take 400 mg by mouth at bedtime.   mometasone 50 MCG/ACT nasal spray Commonly known as:  NASONEX Place 2 sprays into the nose at bedtime.   multivitamin with minerals Tabs tablet Take 1 tablet by mouth daily.   omeprazole 20 MG capsule Commonly known as:  PRILOSEC Take 20 mg by mouth daily.   OXYGEN Inhale 2 L into the lungs daily.   potassium chloride 10 MEQ tablet Commonly known as:  K-DUR Take 1 tablet (10 mEq total) by mouth daily.   predniSONE 10 MG tablet Commonly known as:  DELTASONE Take 4 tablets  by mouth daily for 5 days and stop prednisone.   rOPINIRole 3 MG tablet Commonly known as:  REQUIP Take 3 mg by mouth 2 (two) times daily.   temazepam 15 MG capsule Commonly known as:  RESTORIL Take 15 mg by mouth at bedtime.   warfarin 2 MG tablet Commonly known as:  COUMADIN Take as directed. If you are unsure how to take this medication, talk to your nurse or doctor. Original instructions:  Takes one tablet (2mg ) everyday What changed:    how much to take  how to  take this  when to take this  additional instructions      Allergies  Allergen Reactions  . Penicillins Hives, Itching, Swelling and Rash    Has patient had a PCN reaction causing immediate rash, facial/tongue/throat swelling, SOB or lightheadedness with hypotension: Yes Has patient had a PCN reaction causing severe rash involving mucus membranes or skin necrosis: Yes Has patient had a PCN reaction that required hospitalization: unknown Has patient had a PCN reaction occurring within the last 10 years: No If all of the above answers are "NO", then may proceed with Cephalosporin use.    Throat swelling   Follow-up Information    Fran Lowes, MD Follow up in 4 week(s).   Specialty:  Nephrology Contact information: [address] [place] [address] [phone]        Octavio Graves, DO. Schedule an appointment as soon as possible for a visit in 10 day(s).   Contact information: [address] [place] [address] [phone]        Satira Sark, MD .   Specialty:  Cardiology Contact information: [place] [address] [phone]           The results of significant diagnostics from this hospitalization (including imaging, microbiology, ancillary and laboratory) are listed below for reference.    Significant Diagnostic Studies: US Renal  Result Date: 03/28/2018 CLINICAL DATA:  Renal failure. EXAM: RENAL / URINARY TRACT ULTRASOUND  COMPLETE COMPARISON:  None. FINDINGS: Right Kidney: Length: 9.5 cm. Echogenicity within normal limits. Mild diffuse renal parenchymal atrophy. No mass or hydronephrosis visualized. Left Kidney: Length: 11.8 cm. Echogenicity within normal limits. No mass or hydronephrosis visualized. Bladder: Empty with bladder catheter in place. IMPRESSION: No evidence of hydronephrosis. Small right kidney with mild diffuse parenchymal atrophy. Electronically Signed   By: Earle Gell M.D.   On: 03/28/2018 14:09   Dg Chest Port 1 View  Result Date: 03/30/2018 CLINICAL DATA:  Shortness of breath.  Asthma.  Atrial fibrillation. EXAM: PORTABLE CHEST 1 VIEW COMPARISON:  03/26/2018 FINDINGS: Artifact overlies the chest. Previous median sternotomy CABG and aortic valve replacement. Cardiomegaly as seen previously. Interstitial edema pattern, improved since the study of 4 days ago. Small amount pleural fluid mild atelectasis. IMPRESSION: Improved congestive heart failure pattern with less edema. Electronically Signed   By: Nelson Chimes M.D.   On: 03/30/2018 07:20   Dg Chest Port 1 View  Result Date: 03/26/2018 CLINICAL DATA:  Respiratory failure EXAM: PORTABLE CHEST 1 VIEW COMPARISON:  March 24, 2018 FINDINGS: There is cardiomegaly with pulmonary vascular congestion and mild interstitial edema. There are small pleural effusions bilaterally. There is consolidation in both lower lobes medially. Patient is status post aortic valve replacement. No evident adenopathy. No bone lesions. There is aortic atherosclerosis. IMPRESSION: Pulmonary vascular congestion with suspected congestive heart failure given interstitial pulmonary edema and pleural effusions bilaterally. Consolidation in both lower lobes is likely due to bilateral pneumonia, although there may be a degree of alveolar edema causing this opacity. Both entities may be present concurrently. Patient is status post aortic valve replacement. There is aortic atherosclerosis. Aortic  Atherosclerosis (ICD10-I70.0). Electronically Signed   By: Lowella Grip III M.D.   On: 03/26/2018 13:57   Dg Chest Port 1 View  Result Date: 03/24/2018 CLINICAL DATA:  Mild to CIS, shortness of breath, distress EXAM: PORTABLE CHEST 1 VIEW COMPARISON:  11/04/2017 FINDINGS: Prior CABG and valve replacement. Cardiomegaly with vascular congestion and diffuse interstitial opacities throughout the lungs,  likely interstitial edema. No effusions or acute bony abnormality. IMPRESSION: Mild interstitial edema/CHF. Electronically Signed   By: Rolm Baptise M.D.   On: 03/24/2018 18:19    Microbiology: Recent Results (from the past 240 hour(s))  MRSA PCR Screening     Status: None   Collection Time: 03/25/18 12:38 AM  Result Value Ref Range Status   MRSA by PCR NEGATIVE NEGATIVE Final    Comment:        The GeneXpert MRSA Assay (FDA approved for NASAL specimens only), is one component of a comprehensive MRSA colonization surveillance program. It is not intended to diagnose MRSA infection nor to guide or monitor treatment for MRSA infections. Performed at [facility], [address], [place], [place] [address]      Labs: Basic Metabolic Panel: Recent Labs  Lab 03/24/18 1751  03/27/18 0452 03/28/18 0520 03/29/18 0505 03/30/18 0553 03/31/18 0503  NA 141   < > 132* 133* 135  135 137 140  K 3.2*   < > 4.3 4.8 4.6  4.7 4.5 4.2  CL 99*   < > 92* 93* 93*  92* 92* 92*  CO2 33*   < > 29 30 34*  33* 35* 38*  GLUCOSE 113*   < > 217* 157* 148*  149* 132* 133*  BUN 11   < > 41* 62* 63*  64* 58* 55*  CREATININE 1.11*   < > 2.02* 2.10* 1.65*  1.66* 1.43* 1.35*  CALCIUM 8.9   < > 8.8* 9.0 9.1  9.1 8.8* 8.7*  MG 1.6*  --   --   --   --   --   --   PHOS 2.4*  --   --   --  3.8  --  4.8*   < > = values in this interval not displayed.   Liver Function Tests: Recent Labs  Lab 03/25/18 0531 03/29/18 0505 03/31/18 0503  AST 30  --   --   ALT 17  --   --   ALKPHOS 76  --   --   BILITOT  1.3*  --   --   PROT 7.2  --   --   ALBUMIN 3.5 3.5 3.2*   CBC: Recent Labs  Lab 03/24/18 1751 03/25/18 0531 03/26/18 0511 03/27/18 0452 03/29/18 0505 03/30/18 0553 03/31/18 0503  WBC 9.1 9.0 12.0* 8.4 10.0 9.1 9.5  NEUTROABS 7.2 5.7  --   --   --   --   --   HGB 9.7* 9.6* 10.3* 8.3* 10.5* 10.2* 10.4*  HCT 32.7* 33.3* 36.7 29.6* 33.5* 33.6* 34.3*  MCV 93.2 94.3 97.3 92.8 90.5 90.6 91.2  PLT 220 235 269 279 269 249 243   Cardiac Enzymes: Recent Labs  Lab 03/24/18 1751 03/24/18 2354 03/25/18 0531  TROPONINI 0.03* 0.19* 0.14*   BNP: BNP (last 3 results) Recent Labs    03/24/18 1754  BNP 336.0*    Signed:  Barton Dubois MD.  [facility] 03/31/2018, 4:52 PM

## 2018-04-01 ENCOUNTER — Telehealth: Payer: Self-pay | Admitting: Cardiology

## 2018-04-01 ENCOUNTER — Telehealth: Payer: Self-pay | Admitting: *Deleted

## 2018-04-01 DIAGNOSIS — K219 Gastro-esophageal reflux disease without esophagitis: Secondary | ICD-10-CM | POA: Diagnosis not present

## 2018-04-01 DIAGNOSIS — R5381 Other malaise: Secondary | ICD-10-CM | POA: Diagnosis not present

## 2018-04-01 DIAGNOSIS — I2510 Atherosclerotic heart disease of native coronary artery without angina pectoris: Secondary | ICD-10-CM | POA: Diagnosis not present

## 2018-04-01 DIAGNOSIS — I5033 Acute on chronic diastolic (congestive) heart failure: Secondary | ICD-10-CM | POA: Diagnosis not present

## 2018-04-01 DIAGNOSIS — N183 Chronic kidney disease, stage 3 (moderate): Secondary | ICD-10-CM | POA: Diagnosis not present

## 2018-04-01 DIAGNOSIS — J45909 Unspecified asthma, uncomplicated: Secondary | ICD-10-CM | POA: Diagnosis not present

## 2018-04-01 DIAGNOSIS — R7989 Other specified abnormal findings of blood chemistry: Secondary | ICD-10-CM | POA: Diagnosis not present

## 2018-04-01 DIAGNOSIS — I482 Chronic atrial fibrillation: Secondary | ICD-10-CM | POA: Diagnosis not present

## 2018-04-01 DIAGNOSIS — R69 Illness, unspecified: Secondary | ICD-10-CM | POA: Diagnosis not present

## 2018-04-01 DIAGNOSIS — G2581 Restless legs syndrome: Secondary | ICD-10-CM | POA: Diagnosis not present

## 2018-04-01 NOTE — Telephone Encounter (Signed)
[name] asked that [name] call her back

## 2018-04-01 NOTE — Telephone Encounter (Signed)
Pt was in [facility] 6/5 - 6/12 for CHF.  She was discharged home yesterday with [facility] nurse.  Verdis Frederickson called wanting to know when to check pt's INR.Marland Kitchen  Pt was discharged home with Rx for prednisone 40mg  x 5 days.  She is starting that today.  Pt will take coumadin 3mg  tonight then take 2mg  daily until INR check on Monday 6/17.

## 2018-04-01 NOTE — Telephone Encounter (Signed)
Done.  See coumadin note. 

## 2018-04-05 ENCOUNTER — Ambulatory Visit (INDEPENDENT_AMBULATORY_CARE_PROVIDER_SITE_OTHER): Payer: Medicare HMO | Admitting: *Deleted

## 2018-04-05 ENCOUNTER — Telehealth: Payer: Self-pay | Admitting: *Deleted

## 2018-04-05 DIAGNOSIS — K219 Gastro-esophageal reflux disease without esophagitis: Secondary | ICD-10-CM | POA: Diagnosis not present

## 2018-04-05 DIAGNOSIS — R7989 Other specified abnormal findings of blood chemistry: Secondary | ICD-10-CM | POA: Diagnosis not present

## 2018-04-05 DIAGNOSIS — I482 Chronic atrial fibrillation: Secondary | ICD-10-CM | POA: Diagnosis not present

## 2018-04-05 DIAGNOSIS — I2510 Atherosclerotic heart disease of native coronary artery without angina pectoris: Secondary | ICD-10-CM | POA: Diagnosis not present

## 2018-04-05 DIAGNOSIS — I4891 Unspecified atrial fibrillation: Secondary | ICD-10-CM | POA: Diagnosis not present

## 2018-04-05 DIAGNOSIS — N183 Chronic kidney disease, stage 3 (moderate): Secondary | ICD-10-CM | POA: Diagnosis not present

## 2018-04-05 DIAGNOSIS — Z952 Presence of prosthetic heart valve: Secondary | ICD-10-CM | POA: Diagnosis not present

## 2018-04-05 DIAGNOSIS — J45909 Unspecified asthma, uncomplicated: Secondary | ICD-10-CM | POA: Diagnosis not present

## 2018-04-05 DIAGNOSIS — Z5181 Encounter for therapeutic drug level monitoring: Secondary | ICD-10-CM

## 2018-04-05 DIAGNOSIS — R5381 Other malaise: Secondary | ICD-10-CM | POA: Diagnosis not present

## 2018-04-05 DIAGNOSIS — I5033 Acute on chronic diastolic (congestive) heart failure: Secondary | ICD-10-CM | POA: Diagnosis not present

## 2018-04-05 DIAGNOSIS — R69 Illness, unspecified: Secondary | ICD-10-CM | POA: Diagnosis not present

## 2018-04-05 DIAGNOSIS — G2581 Restless legs syndrome: Secondary | ICD-10-CM | POA: Diagnosis not present

## 2018-04-05 LAB — POCT INR: INR: 2.5 (ref 2.0–3.0)

## 2018-04-05 NOTE — Patient Instructions (Signed)
Continue coumadin 1 1/2 tablets daily except 1 tablet on Sundays and Thursdays Recheck in 1 week Order given to [facility]

## 2018-04-05 NOTE — Telephone Encounter (Signed)
Done.  See coumadin note. 

## 2018-04-05 NOTE — Telephone Encounter (Signed)
INR 2.5 per [facility] w/ [facility] [phone]

## 2018-04-08 DIAGNOSIS — I5042 Chronic combined systolic (congestive) and diastolic (congestive) heart failure: Secondary | ICD-10-CM | POA: Diagnosis not present

## 2018-04-13 ENCOUNTER — Ambulatory Visit (INDEPENDENT_AMBULATORY_CARE_PROVIDER_SITE_OTHER): Payer: Medicare HMO | Admitting: Pharmacist

## 2018-04-13 DIAGNOSIS — N183 Chronic kidney disease, stage 3 (moderate): Secondary | ICD-10-CM | POA: Diagnosis not present

## 2018-04-13 DIAGNOSIS — G2581 Restless legs syndrome: Secondary | ICD-10-CM | POA: Diagnosis not present

## 2018-04-13 DIAGNOSIS — K219 Gastro-esophageal reflux disease without esophagitis: Secondary | ICD-10-CM | POA: Diagnosis not present

## 2018-04-13 DIAGNOSIS — I2510 Atherosclerotic heart disease of native coronary artery without angina pectoris: Secondary | ICD-10-CM | POA: Diagnosis not present

## 2018-04-13 DIAGNOSIS — R5381 Other malaise: Secondary | ICD-10-CM | POA: Diagnosis not present

## 2018-04-13 DIAGNOSIS — Z5181 Encounter for therapeutic drug level monitoring: Secondary | ICD-10-CM

## 2018-04-13 DIAGNOSIS — R69 Illness, unspecified: Secondary | ICD-10-CM | POA: Diagnosis not present

## 2018-04-13 DIAGNOSIS — I482 Chronic atrial fibrillation: Secondary | ICD-10-CM | POA: Diagnosis not present

## 2018-04-13 DIAGNOSIS — J45909 Unspecified asthma, uncomplicated: Secondary | ICD-10-CM | POA: Diagnosis not present

## 2018-04-13 DIAGNOSIS — I4891 Unspecified atrial fibrillation: Secondary | ICD-10-CM

## 2018-04-13 DIAGNOSIS — I5033 Acute on chronic diastolic (congestive) heart failure: Secondary | ICD-10-CM | POA: Diagnosis not present

## 2018-04-13 DIAGNOSIS — R7989 Other specified abnormal findings of blood chemistry: Secondary | ICD-10-CM | POA: Diagnosis not present

## 2018-04-13 DIAGNOSIS — Z952 Presence of prosthetic heart valve: Secondary | ICD-10-CM

## 2018-04-13 LAB — POCT INR: INR: 6.2 — AB (ref 2.0–3.0)

## 2018-04-15 NOTE — Patient Instructions (Signed)
Spoke with pt husband and advised to skip warfarin today. Will call back later to assess dose once pt is more wakeful.  Spoke with pt who reports that she just finished her prednisone 4 tablets daily after her hospital admission. Pt states she has been taking 1 tablet daily of her warfarin since discharge with 2 days of 1/2 tablet.  Will have her hold warfarin for 3 days then resume 1 tablet daily and recheck INR on Monday. Unable to leave message for Angie as VM box is full. Will try again tomorrow.  6/26 - still unable to leave message for nurse  6/26 Jeanmarie Hubert RN [facility] called to get coumadin instructions.  Orders given. [facility] Therapist, sports

## 2018-04-18 DIAGNOSIS — I5033 Acute on chronic diastolic (congestive) heart failure: Secondary | ICD-10-CM | POA: Diagnosis not present

## 2018-04-18 DIAGNOSIS — Z7901 Long term (current) use of anticoagulants: Secondary | ICD-10-CM | POA: Diagnosis not present

## 2018-04-18 DIAGNOSIS — J441 Chronic obstructive pulmonary disease with (acute) exacerbation: Secondary | ICD-10-CM | POA: Diagnosis not present

## 2018-04-19 ENCOUNTER — Telehealth: Payer: Self-pay | Admitting: Cardiology

## 2018-04-19 ENCOUNTER — Ambulatory Visit (INDEPENDENT_AMBULATORY_CARE_PROVIDER_SITE_OTHER): Payer: Medicare HMO | Admitting: *Deleted

## 2018-04-19 DIAGNOSIS — I482 Chronic atrial fibrillation: Secondary | ICD-10-CM | POA: Diagnosis not present

## 2018-04-19 DIAGNOSIS — I5033 Acute on chronic diastolic (congestive) heart failure: Secondary | ICD-10-CM | POA: Diagnosis not present

## 2018-04-19 DIAGNOSIS — I2510 Atherosclerotic heart disease of native coronary artery without angina pectoris: Secondary | ICD-10-CM | POA: Diagnosis not present

## 2018-04-19 DIAGNOSIS — Z5181 Encounter for therapeutic drug level monitoring: Secondary | ICD-10-CM | POA: Diagnosis not present

## 2018-04-19 DIAGNOSIS — I4891 Unspecified atrial fibrillation: Secondary | ICD-10-CM

## 2018-04-19 DIAGNOSIS — Z952 Presence of prosthetic heart valve: Secondary | ICD-10-CM | POA: Diagnosis not present

## 2018-04-19 DIAGNOSIS — R69 Illness, unspecified: Secondary | ICD-10-CM | POA: Diagnosis not present

## 2018-04-19 DIAGNOSIS — N183 Chronic kidney disease, stage 3 (moderate): Secondary | ICD-10-CM | POA: Diagnosis not present

## 2018-04-19 DIAGNOSIS — R7989 Other specified abnormal findings of blood chemistry: Secondary | ICD-10-CM | POA: Diagnosis not present

## 2018-04-19 DIAGNOSIS — K219 Gastro-esophageal reflux disease without esophagitis: Secondary | ICD-10-CM | POA: Diagnosis not present

## 2018-04-19 DIAGNOSIS — J45909 Unspecified asthma, uncomplicated: Secondary | ICD-10-CM | POA: Diagnosis not present

## 2018-04-19 DIAGNOSIS — R5381 Other malaise: Secondary | ICD-10-CM | POA: Diagnosis not present

## 2018-04-19 DIAGNOSIS — G2581 Restless legs syndrome: Secondary | ICD-10-CM | POA: Diagnosis not present

## 2018-04-19 LAB — PROTIME-INR: INR: 6.2 — AB (ref 0.9–1.1)

## 2018-04-19 LAB — POCT INR: INR: 6.2 — AB (ref 2.0–3.0)

## 2018-04-19 NOTE — Telephone Encounter (Signed)
PT by venous stick  63.9 INR  6.2

## 2018-04-19 NOTE — Patient Instructions (Signed)
Hold coumadin x 3 days (Mon,Tue,Wed) , take 1 tablet on Thursday and recheck Friday 04/23/18 Order given to [place]

## 2018-04-19 NOTE — Telephone Encounter (Signed)
Done.  See coumadin note. 

## 2018-04-23 ENCOUNTER — Ambulatory Visit: Payer: Medicare HMO | Admitting: Gastroenterology

## 2018-04-23 ENCOUNTER — Ambulatory Visit (INDEPENDENT_AMBULATORY_CARE_PROVIDER_SITE_OTHER): Payer: Medicare HMO | Admitting: *Deleted

## 2018-04-23 DIAGNOSIS — Z5181 Encounter for therapeutic drug level monitoring: Secondary | ICD-10-CM

## 2018-04-23 DIAGNOSIS — I4891 Unspecified atrial fibrillation: Secondary | ICD-10-CM

## 2018-04-23 DIAGNOSIS — Z952 Presence of prosthetic heart valve: Secondary | ICD-10-CM | POA: Diagnosis not present

## 2018-04-23 DIAGNOSIS — G2581 Restless legs syndrome: Secondary | ICD-10-CM | POA: Diagnosis not present

## 2018-04-23 DIAGNOSIS — I5033 Acute on chronic diastolic (congestive) heart failure: Secondary | ICD-10-CM | POA: Diagnosis not present

## 2018-04-23 DIAGNOSIS — K219 Gastro-esophageal reflux disease without esophagitis: Secondary | ICD-10-CM | POA: Diagnosis not present

## 2018-04-23 DIAGNOSIS — R69 Illness, unspecified: Secondary | ICD-10-CM | POA: Diagnosis not present

## 2018-04-23 DIAGNOSIS — J45909 Unspecified asthma, uncomplicated: Secondary | ICD-10-CM | POA: Diagnosis not present

## 2018-04-23 DIAGNOSIS — I482 Chronic atrial fibrillation: Secondary | ICD-10-CM | POA: Diagnosis not present

## 2018-04-23 DIAGNOSIS — R7989 Other specified abnormal findings of blood chemistry: Secondary | ICD-10-CM | POA: Diagnosis not present

## 2018-04-23 DIAGNOSIS — R5381 Other malaise: Secondary | ICD-10-CM | POA: Diagnosis not present

## 2018-04-23 DIAGNOSIS — N183 Chronic kidney disease, stage 3 (moderate): Secondary | ICD-10-CM | POA: Diagnosis not present

## 2018-04-23 DIAGNOSIS — I2510 Atherosclerotic heart disease of native coronary artery without angina pectoris: Secondary | ICD-10-CM | POA: Diagnosis not present

## 2018-04-23 LAB — POCT INR: INR: 3.2 — AB (ref 2.0–3.0)

## 2018-04-23 NOTE — Patient Instructions (Signed)
Start coumadin 1mg  daily and recheck on Monday Order given to [place]

## 2018-04-26 ENCOUNTER — Ambulatory Visit (INDEPENDENT_AMBULATORY_CARE_PROVIDER_SITE_OTHER): Payer: Medicare HMO | Admitting: *Deleted

## 2018-04-26 DIAGNOSIS — Z5181 Encounter for therapeutic drug level monitoring: Secondary | ICD-10-CM

## 2018-04-26 DIAGNOSIS — Z952 Presence of prosthetic heart valve: Secondary | ICD-10-CM | POA: Diagnosis not present

## 2018-04-26 DIAGNOSIS — I4891 Unspecified atrial fibrillation: Secondary | ICD-10-CM

## 2018-04-26 LAB — POCT INR: INR: 3.2 — AB (ref 2.0–3.0)

## 2018-04-26 NOTE — Patient Instructions (Signed)
Hold coumadin tonight and continue 1mg  daily  Recheck in office on 7/18 Order given to [place].  Pt d/c from home health today.

## 2018-04-27 ENCOUNTER — Emergency Department (HOSPITAL_COMMUNITY)
Admission: EM | Admit: 2018-04-27 | Discharge: 2018-04-27 | Disposition: A | Discharge status: Home / Self Care | Payer: Medicare HMO | Attending: Emergency Medicine | Admitting: Emergency Medicine

## 2018-04-27 ENCOUNTER — Encounter (HOSPITAL_COMMUNITY): Payer: Self-pay | Admitting: Emergency Medicine

## 2018-04-27 ENCOUNTER — Emergency Department (HOSPITAL_COMMUNITY): Payer: Medicare HMO

## 2018-04-27 DIAGNOSIS — N183 Chronic kidney disease, stage 3 (moderate): Secondary | ICD-10-CM | POA: Insufficient documentation

## 2018-04-27 DIAGNOSIS — I5032 Chronic diastolic (congestive) heart failure: Secondary | ICD-10-CM | POA: Diagnosis not present

## 2018-04-27 DIAGNOSIS — J449 Chronic obstructive pulmonary disease, unspecified: Secondary | ICD-10-CM | POA: Diagnosis not present

## 2018-04-27 DIAGNOSIS — Z7901 Long term (current) use of anticoagulants: Secondary | ICD-10-CM | POA: Diagnosis not present

## 2018-04-27 DIAGNOSIS — Z87891 Personal history of nicotine dependence: Secondary | ICD-10-CM | POA: Insufficient documentation

## 2018-04-27 DIAGNOSIS — R9431 Abnormal electrocardiogram [ECG] [EKG]: Secondary | ICD-10-CM | POA: Diagnosis not present

## 2018-04-27 DIAGNOSIS — J40 Bronchitis, not specified as acute or chronic: Secondary | ICD-10-CM

## 2018-04-27 DIAGNOSIS — I259 Chronic ischemic heart disease, unspecified: Secondary | ICD-10-CM | POA: Insufficient documentation

## 2018-04-27 DIAGNOSIS — Z79899 Other long term (current) drug therapy: Secondary | ICD-10-CM | POA: Insufficient documentation

## 2018-04-27 DIAGNOSIS — R0602 Shortness of breath: Secondary | ICD-10-CM | POA: Diagnosis present

## 2018-04-27 DIAGNOSIS — R0989 Other specified symptoms and signs involving the circulatory and respiratory systems: Secondary | ICD-10-CM | POA: Diagnosis not present

## 2018-04-27 DIAGNOSIS — R0600 Dyspnea, unspecified: Secondary | ICD-10-CM | POA: Insufficient documentation

## 2018-04-27 DIAGNOSIS — I5042 Chronic combined systolic (congestive) and diastolic (congestive) heart failure: Secondary | ICD-10-CM | POA: Diagnosis not present

## 2018-04-27 LAB — CBC WITH DIFFERENTIAL/PLATELET
BASOS ABS: 0 10*3/uL (ref 0.0–0.1)
Basophils Relative: 0 %
EOS ABS: 0.1 10*3/uL (ref 0.0–0.7)
EOS PCT: 1 %
HCT: 34.8 % — ABNORMAL LOW (ref 36.0–46.0)
Hemoglobin: 10.4 g/dL — ABNORMAL LOW (ref 12.0–15.0)
LYMPHS ABS: 2.3 10*3/uL (ref 0.7–4.0)
LYMPHS PCT: 27 %
MCH: 26.9 pg (ref 26.0–34.0)
MCHC: 29.9 g/dL — ABNORMAL LOW (ref 30.0–36.0)
MCV: 90.2 fL (ref 78.0–100.0)
MONO ABS: 1 10*3/uL (ref 0.1–1.0)
Monocytes Relative: 11 %
NEUTROS ABS: 5.2 10*3/uL (ref 1.7–7.7)
Neutrophils Relative %: 61 %
PLATELETS: 202 10*3/uL (ref 150–400)
RBC: 3.86 MIL/uL — AB (ref 3.87–5.11)
RDW: 15.8 % — AB (ref 11.5–15.5)
WBC: 8.5 10*3/uL (ref 4.0–10.5)

## 2018-04-27 LAB — BASIC METABOLIC PANEL
ANION GAP: 8 (ref 5–15)
BUN: 21 mg/dL (ref 8–23)
CALCIUM: 8.8 mg/dL — AB (ref 8.9–10.3)
CO2: 35 mmol/L — AB (ref 22–32)
CREATININE: 1.37 mg/dL — AB (ref 0.44–1.00)
Chloride: 98 mmol/L (ref 98–111)
GFR calc Af Amer: 45 mL/min — ABNORMAL LOW (ref >60)
GFR, EST NON AFRICAN AMERICAN: 39 mL/min — AB (ref >60)
GLUCOSE: 83 mg/dL (ref 70–99)
Potassium: 3.3 mmol/L — ABNORMAL LOW (ref 3.5–5.1)
Sodium: 141 mmol/L (ref 135–145)

## 2018-04-27 LAB — BRAIN NATRIURETIC PEPTIDE: B NATRIURETIC PEPTIDE 5: 543 pg/mL — AB (ref 0.0–100.0)

## 2018-04-27 LAB — TROPONIN I
Troponin I: 0.03 ng/mL (ref <0.03)
Troponin I: 0.03 ng/mL (ref <0.03)

## 2018-04-27 MED ORDER — HYDROCOD POLST-CPM POLST ER 10-8 MG/5ML PO SUER
5.0000 mL | Freq: Once | ORAL | Status: AC
  Administered 2018-04-27: 5 mL via ORAL
  Filled 2018-04-27: qty 5

## 2018-04-27 MED ORDER — HYDROCOD POLST-CPM POLST ER 10-8 MG/5ML PO SUER: 5.0000 mL | Freq: Two times a day (BID) | ORAL | 0 refills | Status: DC | PRN

## 2018-04-27 MED ORDER — AZITHROMYCIN 250 MG PO TABS: 250.0000 mg | ORAL_TABLET | Freq: Every day | ORAL | 0 refills | Status: DC

## 2018-04-27 NOTE — ED Triage Notes (Signed)
Patient complaining of shortness of breath starting today. States Dr Melina Copa sent her to ER for CHF. Denies chest pain.

## 2018-04-27 NOTE — Discharge Instructions (Addendum)
Tests show no life-threatening condition or obvious fluid in your lungs.  Will start antibiotic for your chronic cough and sputum production.  Follow-up with your primary care doctor.

## 2018-04-27 NOTE — ED Notes (Signed)
Date and time results received: 04/27/18 8:57 PM    Test: troponin Critical Value: 0.03 ng/mL  Name of Provider Notified: DR Lacinda Axon  Orders Received? Or Actions Taken?: MD notified

## 2018-04-27 NOTE — ED Provider Notes (Signed)
[facility] Provider Note   CSN: [id number] Arrival date & time: 04/27/18  1444     History   Chief Complaint Chief Complaint  Patient presents with  . Shortness of Breath    HPI [name] is a 67 y.o. female.  Shortness of breath for 1 week.  No substernal chest pain.  Patient reports a productive cough for 2 to 3 weeks.  Past medical history includes acute on chronic CHF, aortic stenosis, status post AVR in 2007, atrial fibrillation, CAD, peripheral edema, many others.  She was seen by her primary care doctor today who suggested she come to the emergency department.  She is normally on home oxygen     Past Medical History:  Diagnosis Date  . Acute on chronic diastolic (congestive) heart failure ([place])   . Anxiety disorder   . Aortic stenosis    Status post St. Jude mechanical AVR 2007  . Asthma   . Atrial fibrillation ([place])   . Carcinoid tumor of colon 2007  . Coronary atherosclerosis of native coronary artery    Status post CABG 2007  . GERD (gastroesophageal reflux disease)   . History of colonoscopy 2003   Dr. Gala Romney - normal  . Hyperlipidemia   . Macromastia   . Pedal edema    Bilateral, chronic  . Peptic stricture of esophagus 10/04/2010   GE junction on last EGD by Dr. Gala Romney, benign biopsies  . RLS (restless legs syndrome)   . Schatzki's ring     Patient Active Problem List   Diagnosis Date Noted  . Physical deconditioning   . Coronary artery disease of bypass graft of native heart with stable angina pectoris ([place])   . Acute renal failure superimposed on stage 3 chronic kidney disease ([place])   . Acute on chronic respiratory failure with hypercapnia ([place]) 03/24/2018  . Hypomagnesemia 03/24/2018  . Hypokalemia 03/24/2018  . Hypophosphatemia 03/24/2018  . Atrial fibrillation with RVR ([place]) 03/24/2018  . Elevated troponin 03/24/2018  . Coughing 01/14/2018  . Anemia 01/14/2018  . Hx of adenomatous colonic polyps 01/14/2018  .  Acute on chronic diastolic congestive heart failure ([place]) 12/17/2016  . COPD exacerbation ([place]) 12/17/2016  . Bilateral leg edema 07/05/2014  . Encounter for therapeutic drug monitoring 11/15/2013  . Chronic venous insufficiency 08/26/2011  . Chronic anticoagulation 01/11/2011  . GERD 09/16/2010  . DYSPHAGIA 09/16/2010  . Aortic valve replaced 06/05/2009  . Hyperlipidemia 06/04/2009  . Coronary atherosclerosis of native coronary artery 06/04/2009  . Atrial fibrillation ([place]) 06/04/2009    Past Surgical History:  Procedure Laterality Date  . ABDOMINAL HYSTERECTOMY    . AORTIC VALVE REPLACEMENT  2007   #25 mm St. 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 APPENDECTOMY  2007  . BACK SURGERY     lumbar 4 and 5   . BIOPSY  02/05/2012   RMR:Two tongues of salmon-colored epithelium distal esophagus, very short-segment Barrett's s/p bx/Small hiatal hernia, otherwise normal stomach, D1, D2. Status post esophageal dilation. Biopsy showed GERD.  . Breast cyst removed     bilateral  . BREAST REDUCTION SURGERY    . CESAREAN SECTION    . 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 COLONOSCOPY  02/05/2012   PRF:FMBWGY rectum, sigmoid diverticulosis,descending colon polyp , tubular adenoma  . CORONARY ARTERY BYPASS GRAFT     06/2006 - RIMA to RCA, SVG to RCA  . ESOPHAGOGASTRODUODENOSCOPY  10/03/2010  Dr. Gala Romney- schatzki's ring, shoft peptic stricture at GE junction.  . ESOPHAGOGASTRODUODENOSCOPY (EGD) WITH PROPOFOL N/A 02/08/2018   Procedure: ESOPHAGOGASTRODUODENOSCOPY (EGD) WITH PROPOFOL;  Surgeon: Daneil Dolin, MD;  Location: [facility];  Service: Endoscopy;  Laterality: N/A;  8:15am  . FOOT SURGERY     bilateral bunionectomy  . HERNIA REPAIR     with mesh  . LAPAROTOMY  2007   small bowel resection secondary to small bowel obstruction  . MALONEY DILATION  02/05/2012   Procedure: Venia Minks DILATION;  Surgeon: Daneil Dolin,  MD;  Location: [facility] ORS;  Service: Endoscopy;  Laterality: N/A;  36mm   . MALONEY DILATION N/A 02/08/2018   Procedure: Venia Minks DILATION;  Surgeon: Daneil Dolin, MD;  Location: [facility];  Service: Endoscopy;  Laterality: N/A;  . Teeth removal       OB History   None      Home Medications    Prior to Admission medications   Medication Sig Start Date End Date Taking? Authorizing Provider  albuterol (PROVENTIL HFA;VENTOLIN HFA) 108 (90 BASE) MCG/ACT inhaler Inhale 1-2 puffs into the lungs every 6 (six) hours as needed for wheezing or shortness of breath.    Yes [provider]  allopurinol (ZYLOPRIM) 100 MG tablet Take 100 mg by mouth daily.   Yes [provider]  ALPRAZolam Duanne Moron) 1 MG tablet Take 1 mg by mouth every morning. *May take one tablet two times daily as needed for anxiety* 09/20/16  Yes [provider]  benzonatate (TESSALON) 200 MG capsule Take 1 capsule (200 mg total) by mouth 2 (two) times daily as needed for cough. Patient taking differently: Take 200 mg by mouth 2 (two) times daily. *May take one capsule every 8 hours as needed for cough* 03/19/17  Yes Mariel Aloe, MD  bisoprolol (ZEBETA) 5 MG tablet Take 1 tablet (5 mg total) by mouth daily. 04/01/18  Yes Barton Dubois, MD  budesonide-formoterol [facility]) 160-4.5 MCG/ACT inhaler Inhale 2 puffs into the lungs 2 (two) times daily. 03/31/18  Yes Barton Dubois, MD  calcium carbonate (OS-CAL) 600 MG TABS tablet Take 600 mg by mouth 2 (two) times daily.   Yes [provider]  cetirizine (ZYRTEC) 10 MG tablet Take 10 mg by mouth daily.     Yes [provider]  clobetasol cream (TEMOVATE) 5.57 % Apply 1 application topically 2 (two) times daily as needed (FOR SKIN IRRITATION/REDNESS & BLISTERING ON LEGS).  02/26/17  Yes [provider]  CRESTOR 10 MG tablet Take 10 mg by mouth at bedtime.  12/02/13  Yes [provider]  cyclobenzaprine (FLEXERIL) 10 MG tablet  Take 10 mg by mouth 3 (three) times daily as needed for muscle spasms.    Yes [provider]  diltiazem (CARDIZEM CD) 120 MG 24 hr capsule Take 1 capsule (120 mg total) by mouth daily. 04/01/18  Yes Barton Dubois, MD  furosemide (LASIX) 40 MG tablet Take 1 tablet (40 mg total) by mouth 2 (two) times daily. 03/31/18  Yes Barton Dubois, MD  HYDROcodone-acetaminophen [facility]) 10-325 MG per tablet Take 1 tablet by mouth at bedtime. *May take one tablet every 12 hours as needed for pain*   Yes [provider]  levothyroxine (SYNTHROID, LEVOTHROID) 50 MCG tablet Take 50 mcg by mouth daily before breakfast.    Yes [provider]  magnesium oxide (MAG-OX) 400 MG tablet Take 400 mg by mouth at bedtime.   Yes [provider]  mometasone (NASONEX)  50 MCG/ACT nasal spray Place 2 sprays into the nose at bedtime. 03/03/18  Yes [provider]  Multiple Vitamin (MULTIVITAMIN WITH MINERALS) TABS tablet Take 1 tablet by mouth daily.   Yes [provider]  omeprazole (PRILOSEC) 20 MG capsule Take 20 mg by mouth daily.   Yes [provider]  OXYGEN Inhale 2 L into the lungs daily.    Yes [provider]  potassium chloride (K-DUR) 10 MEQ tablet Take 1 tablet (10 mEq total) by mouth daily. 04/03/17 01/29/19 Yes Lendon Colonel, NP  rOPINIRole (REQUIP) 3 MG tablet Take 3 mg by mouth 2 (two) times daily.    Yes [provider]  temazepam (RESTORIL) 15 MG capsule Take 15 mg by mouth at bedtime.    Yes [provider]  warfarin (COUMADIN) 2 MG tablet Takes one tablet (2mg ) everyday Patient taking differently: Take 1 mg by mouth every evening.  03/03/17  Yes Branch, Alphonse Guild, MD  azithromycin (ZITHROMAX) 250 MG tablet Take 1 tablet (250 mg total) by mouth daily. Take first 2 tablets together, then 1 every day until finished. 04/27/18   Nat Christen, MD  fluconazole (DIFLUCAN) 100 MG tablet Take 100 mg by mouth daily. 03/02/18   [provider]  predniSONE (DELTASONE) 10 MG tablet Take 4 tablets by mouth daily for 5 days and stop prednisone. Patient not taking: Reported on 04/27/2018 03/31/18   Barton Dubois, MD    Family History Family History  Problem Relation Age of Onset  . Stroke Mother   . Cancer Father   . Anesthesia problems Neg Hx   . Hypotension Neg Hx   . Malignant hyperthermia Neg Hx   . Pseudochol deficiency Neg Hx     Social History Social History   Tobacco Use  . Smoking status: Former Smoker    Packs/day: 1.00    Years: 20.00    Pack years: 20.00    Types: Cigarettes    Last attempt to quit: 10/20/1988    Years since quitting: 29.5  . Smokeless tobacco: Never Used  Substance Use Topics  . Alcohol use: No    Alcohol/week: 0.0 oz  . Drug use: No     Allergies   Penicillins   Review of Systems Review of Systems  All other systems reviewed and are negative.    Physical Exam Updated Vital Signs BP 105/85   Pulse 79   Temp 97.9 F (36.6 C) (Temporal)   Resp 20   Ht 5\' 8"  (1.727 m)   Wt 94.3 kg (208 lb)   SpO2 93%   BMI 31.63 kg/m   Physical Exam  Constitutional: She is oriented to person, place, and time.  No respiratory distress.  HENT:  Head: Normocephalic and atraumatic.  Eyes: Conjunctivae are normal.  Neck: Neck supple.  Cardiovascular: Normal rate and regular rhythm.  Pulmonary/Chest: Effort normal and breath sounds normal.  No rales.  Abdominal: Soft. Bowel sounds are normal.  Musculoskeletal: Normal range of motion.  Neurological: She is alert and oriented to person, place, and time.  Skin: Skin is warm and dry.  Venous stasis ulcers bilaterally  Psychiatric: She has a normal mood and affect. Her behavior is normal.  Nursing note and vitals reviewed.    ED Treatments / Results  Labs (all labs ordered are listed, but only abnormal results are displayed) Labs Reviewed  CBC WITH DIFFERENTIAL/PLATELET - Abnormal; Notable for the following components:        Result Value  RBC 3.86 (*)    Hemoglobin 10.4 (*)    HCT 34.8 (*)    MCHC 29.9 (*)    RDW 15.8 (*)    All other components within normal limits  BRAIN NATRIURETIC PEPTIDE - Abnormal; Notable for the following components:   B Natriuretic Peptide 543.0 (*)    All other components within normal limits  BASIC METABOLIC PANEL - Abnormal; Notable for the following components:   Potassium 3.3 (*)    CO2 35 (*)    Creatinine, Ser 1.37 (*)    Calcium 8.8 (*)    GFR calc non Af Amer 39 (*)    GFR calc Af Amer 45 (*)    All other components within normal limits  TROPONIN I - Abnormal; Notable for the following components:   Troponin I 0.03 (*)    All other components within normal limits  TROPONIN I - Abnormal; Notable for the following components:   Troponin I 0.03 (*)    All other components within normal limits    EKG EKG Interpretation  Date/Time:  Tuesday April 27 2018 15:21:31 EDT Ventricular Rate:  77 PR Interval:    QRS Duration: 86 QT Interval:  380 QTC Calculation: 430 R Axis:   75 Text Interpretation:  Atrial fibrillation T wave abnormality, consider inferior ischemia Abnormal ECG Confirmed by Nat Christen [phone]) on 04/27/2018 4:04:17 PM   Radiology Dg Chest 2 View  Result Date: 04/27/2018 CLINICAL DATA:  Increased dyspnea this week. EXAM: CHEST - 2 VIEW COMPARISON:  03/30/2018 FINDINGS: Stable cardiomegaly with aortic atherosclerosis. Aortic valvular replacement with median sternotomy sutures are again redemonstrated. Mild central vascular congestion is seen without pulmonary consolidation, effusion or pneumothorax. No acute osseous abnormality. IMPRESSION: Stable cardiomegaly with aortic atherosclerosis. Mild pulmonary vascular congestion. Electronically Signed   By: Ashley Royalty M.D.   On: 04/27/2018 16:22    Procedures Procedures (including critical care time)  Medications Ordered in ED Medications - No data to display   Initial Impression / Assessment and  Plan / ED Course  I have reviewed the triage vital signs and the nursing notes.  Pertinent labs & imaging results that were available during my care of the patient were reviewed by me and considered in my medical decision making (see chart for details).     Patient has known acute on chronic CHF along with COPD requiring home oxygen.  She reports a productive cough for 3 weeks.  Chest x-ray shows no obvious heart failure or pneumonia.  Troponin 0 0.032.  Will discharge home with Zithromax and Tussionex.  Patient has primary care follow-up.  She is hemodynamically stable at discharge.  Final Clinical Impressions(s) / ED Diagnoses   Final diagnoses:  Dyspnea, unspecified type  Bronchitis    ED Discharge Orders        Ordered    azithromycin (ZITHROMAX) 250 MG tablet  Daily     04/27/18 2123       Nat Christen, MD 04/27/18 2251

## 2018-04-27 NOTE — ED Notes (Signed)
Called lab for troponin update, currently being ran.

## 2018-05-02 ENCOUNTER — Other Ambulatory Visit: Payer: Self-pay

## 2018-05-02 ENCOUNTER — Emergency Department (HOSPITAL_COMMUNITY): Payer: Medicare HMO

## 2018-05-02 ENCOUNTER — Inpatient Hospital Stay (HOSPITAL_COMMUNITY)
Admission: EM | Admit: 2018-05-02 | Discharge: 2018-05-05 | DRG: 291 | Disposition: A | Discharge status: Home Health | Payer: Medicare HMO | Attending: Family Medicine | Admitting: Family Medicine

## 2018-05-02 ENCOUNTER — Encounter (HOSPITAL_COMMUNITY): Payer: Self-pay | Admitting: Emergency Medicine

## 2018-05-02 DIAGNOSIS — Z79899 Other long term (current) drug therapy: Secondary | ICD-10-CM | POA: Diagnosis not present

## 2018-05-02 DIAGNOSIS — F419 Anxiety disorder, unspecified: Secondary | ICD-10-CM | POA: Diagnosis present

## 2018-05-02 DIAGNOSIS — G9341 Metabolic encephalopathy: Secondary | ICD-10-CM | POA: Diagnosis not present

## 2018-05-02 DIAGNOSIS — Z7951 Long term (current) use of inhaled steroids: Secondary | ICD-10-CM

## 2018-05-02 DIAGNOSIS — N183 Chronic kidney disease, stage 3 unspecified: Secondary | ICD-10-CM | POA: Diagnosis present

## 2018-05-02 DIAGNOSIS — E785 Hyperlipidemia, unspecified: Secondary | ICD-10-CM | POA: Diagnosis present

## 2018-05-02 DIAGNOSIS — I2510 Atherosclerotic heart disease of native coronary artery without angina pectoris: Secondary | ICD-10-CM | POA: Diagnosis not present

## 2018-05-02 DIAGNOSIS — R531 Weakness: Secondary | ICD-10-CM

## 2018-05-02 DIAGNOSIS — J449 Chronic obstructive pulmonary disease, unspecified: Secondary | ICD-10-CM | POA: Diagnosis present

## 2018-05-02 DIAGNOSIS — Z9981 Dependence on supplemental oxygen: Secondary | ICD-10-CM | POA: Diagnosis not present

## 2018-05-02 DIAGNOSIS — Z951 Presence of aortocoronary bypass graft: Secondary | ICD-10-CM

## 2018-05-02 DIAGNOSIS — I5089 Other heart failure: Secondary | ICD-10-CM | POA: Diagnosis not present

## 2018-05-02 DIAGNOSIS — E161 Other hypoglycemia: Secondary | ICD-10-CM | POA: Diagnosis not present

## 2018-05-02 DIAGNOSIS — Z880 Allergy status to penicillin: Secondary | ICD-10-CM

## 2018-05-02 DIAGNOSIS — K219 Gastro-esophageal reflux disease without esophagitis: Secondary | ICD-10-CM | POA: Diagnosis present

## 2018-05-02 DIAGNOSIS — R0902 Hypoxemia: Secondary | ICD-10-CM | POA: Diagnosis not present

## 2018-05-02 DIAGNOSIS — J441 Chronic obstructive pulmonary disease with (acute) exacerbation: Secondary | ICD-10-CM | POA: Diagnosis not present

## 2018-05-02 DIAGNOSIS — I509 Heart failure, unspecified: Secondary | ICD-10-CM | POA: Diagnosis not present

## 2018-05-02 DIAGNOSIS — R791 Abnormal coagulation profile: Secondary | ICD-10-CM | POA: Diagnosis present

## 2018-05-02 DIAGNOSIS — Z952 Presence of prosthetic heart valve: Secondary | ICD-10-CM

## 2018-05-02 DIAGNOSIS — R0602 Shortness of breath: Secondary | ICD-10-CM | POA: Diagnosis not present

## 2018-05-02 DIAGNOSIS — J9622 Acute and chronic respiratory failure with hypercapnia: Secondary | ICD-10-CM | POA: Diagnosis not present

## 2018-05-02 DIAGNOSIS — I482 Chronic atrial fibrillation, unspecified: Secondary | ICD-10-CM | POA: Diagnosis present

## 2018-05-02 DIAGNOSIS — G8929 Other chronic pain: Secondary | ICD-10-CM | POA: Diagnosis present

## 2018-05-02 DIAGNOSIS — I4891 Unspecified atrial fibrillation: Secondary | ICD-10-CM | POA: Diagnosis not present

## 2018-05-02 DIAGNOSIS — G2581 Restless legs syndrome: Secondary | ICD-10-CM | POA: Diagnosis present

## 2018-05-02 DIAGNOSIS — I5033 Acute on chronic diastolic (congestive) heart failure: Secondary | ICD-10-CM | POA: Diagnosis present

## 2018-05-02 DIAGNOSIS — R0689 Other abnormalities of breathing: Secondary | ICD-10-CM | POA: Diagnosis not present

## 2018-05-02 DIAGNOSIS — Z7901 Long term (current) use of anticoagulants: Secondary | ICD-10-CM | POA: Diagnosis not present

## 2018-05-02 DIAGNOSIS — E039 Hypothyroidism, unspecified: Secondary | ICD-10-CM | POA: Diagnosis not present

## 2018-05-02 DIAGNOSIS — R69 Illness, unspecified: Secondary | ICD-10-CM | POA: Diagnosis not present

## 2018-05-02 DIAGNOSIS — R0600 Dyspnea, unspecified: Secondary | ICD-10-CM | POA: Diagnosis not present

## 2018-05-02 LAB — COMPREHENSIVE METABOLIC PANEL
ALBUMIN: 3.4 g/dL — AB (ref 3.5–5.0)
ALK PHOS: 88 U/L (ref 38–126)
ALT: 109 U/L — AB (ref 0–44)
AST: 98 U/L — ABNORMAL HIGH (ref 15–41)
Anion gap: 7 (ref 5–15)
BUN: 30 mg/dL — ABNORMAL HIGH (ref 8–23)
CO2: 34 mmol/L — AB (ref 22–32)
CREATININE: 1.75 mg/dL — AB (ref 0.44–1.00)
Calcium: 8.7 mg/dL — ABNORMAL LOW (ref 8.9–10.3)
Chloride: 96 mmol/L — ABNORMAL LOW (ref 98–111)
GFR calc non Af Amer: 29 mL/min — ABNORMAL LOW (ref >60)
GFR, EST AFRICAN AMERICAN: 34 mL/min — AB (ref >60)
Glucose, Bld: 77 mg/dL (ref 70–99)
Potassium: 4.3 mmol/L (ref 3.5–5.1)
SODIUM: 137 mmol/L (ref 135–145)
Total Bilirubin: 1.1 mg/dL (ref 0.3–1.2)
Total Protein: 6.6 g/dL (ref 6.5–8.1)

## 2018-05-02 LAB — BLOOD GAS, ARTERIAL
Acid-Base Excess: 7.3 mmol/L — ABNORMAL HIGH (ref 0.0–2.0)
BICARBONATE: 30.2 mmol/L — AB (ref 20.0–28.0)
DRAWN BY: 234301
O2 Content: 3 L/min
O2 Saturation: 95.1 %
PCO2 ART: 61.9 mmHg — AB (ref 32.0–48.0)
PH ART: 7.346 — AB (ref 7.350–7.450)
pO2, Arterial: 80.5 mmHg — ABNORMAL LOW (ref 83.0–108.0)

## 2018-05-02 LAB — TROPONIN I
Troponin I: 0.06 ng/mL (ref <0.03)
Troponin I: 0.06 ng/mL (ref <0.03)
Troponin I: 0.06 ng/mL (ref <0.03)

## 2018-05-02 LAB — CBC
HEMATOCRIT: 34.8 % — AB (ref 36.0–46.0)
HEMOGLOBIN: 10.2 g/dL — AB (ref 12.0–15.0)
MCH: 26.7 pg (ref 26.0–34.0)
MCHC: 29.3 g/dL — ABNORMAL LOW (ref 30.0–36.0)
MCV: 91.1 fL (ref 78.0–100.0)
Platelets: 191 10*3/uL (ref 150–400)
RBC: 3.82 MIL/uL — AB (ref 3.87–5.11)
RDW: 16.6 % — AB (ref 11.5–15.5)
WBC: 10.2 10*3/uL (ref 4.0–10.5)

## 2018-05-02 LAB — MRSA PCR SCREENING: MRSA BY PCR: NEGATIVE

## 2018-05-02 LAB — PROTIME-INR
INR: 7.06
Prothrombin Time: 60.3 seconds — ABNORMAL HIGH (ref 11.4–15.2)

## 2018-05-02 LAB — BRAIN NATRIURETIC PEPTIDE: B Natriuretic Peptide: 664 pg/mL — ABNORMAL HIGH (ref 0.0–100.0)

## 2018-05-02 MED ORDER — ACETAMINOPHEN 325 MG PO TABS
650.0000 mg | ORAL_TABLET | ORAL | Status: DC | PRN
  Administered 2018-05-03: 650 mg via ORAL
  Filled 2018-05-02: qty 2

## 2018-05-02 MED ORDER — FUROSEMIDE 10 MG/ML IJ SOLN
40.0000 mg | Freq: Once | INTRAMUSCULAR | Status: AC
  Administered 2018-05-02: 40 mg via INTRAVENOUS
  Filled 2018-05-02: qty 4

## 2018-05-02 MED ORDER — ROSUVASTATIN CALCIUM 10 MG PO TABS
10.0000 mg | ORAL_TABLET | Freq: Every day | ORAL | Status: DC
  Administered 2018-05-02 – 2018-05-04 (×3): 10 mg via ORAL
  Filled 2018-05-02 (×3): qty 1

## 2018-05-02 MED ORDER — BISOPROLOL FUMARATE 5 MG PO TABS
5.0000 mg | ORAL_TABLET | Freq: Every day | ORAL | Status: DC
  Administered 2018-05-02 – 2018-05-05 (×4): 5 mg via ORAL
  Filled 2018-05-02 (×4): qty 1

## 2018-05-02 MED ORDER — ORAL CARE MOUTH RINSE
15.0000 mL | Freq: Two times a day (BID) | OROMUCOSAL | Status: DC
  Administered 2018-05-03: 15 mL via OROMUCOSAL

## 2018-05-02 MED ORDER — ALLOPURINOL 100 MG PO TABS
100.0000 mg | ORAL_TABLET | Freq: Every day | ORAL | Status: DC
  Administered 2018-05-02 – 2018-05-05 (×4): 100 mg via ORAL
  Filled 2018-05-02 (×4): qty 1

## 2018-05-02 MED ORDER — CHLORHEXIDINE GLUCONATE 0.12 % MT SOLN
15.0000 mL | Freq: Two times a day (BID) | OROMUCOSAL | Status: DC
  Administered 2018-05-02 – 2018-05-05 (×6): 15 mL via OROMUCOSAL
  Filled 2018-05-02 (×6): qty 15

## 2018-05-02 MED ORDER — ONDANSETRON HCL 4 MG/2ML IJ SOLN: 4.0000 mg | Freq: Four times a day (QID) | INTRAMUSCULAR | Status: DC | PRN

## 2018-05-02 MED ORDER — PANTOPRAZOLE SODIUM 40 MG PO TBEC
40.0000 mg | DELAYED_RELEASE_TABLET | Freq: Every day | ORAL | Status: DC
  Administered 2018-05-02 – 2018-05-05 (×4): 40 mg via ORAL
  Filled 2018-05-02 (×4): qty 1

## 2018-05-02 MED ORDER — ALPRAZOLAM 0.25 MG PO TABS
0.2500 mg | ORAL_TABLET | Freq: Two times a day (BID) | ORAL | Status: DC | PRN
  Administered 2018-05-02: 0.25 mg via ORAL
  Filled 2018-05-02: qty 1

## 2018-05-02 MED ORDER — WARFARIN - PHARMACIST DOSING INPATIENT
Freq: Every day | Status: DC
  Administered 2018-05-03: 15:00:00

## 2018-05-02 MED ORDER — SODIUM CHLORIDE 0.9 % IV SOLN: 250.0000 mL | INTRAVENOUS | Status: DC | PRN

## 2018-05-02 MED ORDER — IPRATROPIUM-ALBUTEROL 0.5-2.5 (3) MG/3ML IN SOLN
3.0000 mL | Freq: Four times a day (QID) | RESPIRATORY_TRACT | Status: DC
  Administered 2018-05-02 – 2018-05-03 (×3): 3 mL via RESPIRATORY_TRACT
  Filled 2018-05-02 (×3): qty 3

## 2018-05-02 MED ORDER — LEVOTHYROXINE SODIUM 50 MCG PO TABS
50.0000 ug | ORAL_TABLET | Freq: Every day | ORAL | Status: DC
  Administered 2018-05-03 – 2018-05-05 (×3): 50 ug via ORAL
  Filled 2018-05-02 (×2): qty 2
  Filled 2018-05-02: qty 1

## 2018-05-02 MED ORDER — MOMETASONE FURO-FORMOTEROL FUM 200-5 MCG/ACT IN AERO
2.0000 | INHALATION_SPRAY | Freq: Two times a day (BID) | RESPIRATORY_TRACT | Status: DC
  Filled 2018-05-02: qty 8.8

## 2018-05-02 MED ORDER — SODIUM CHLORIDE 0.9% FLUSH
3.0000 mL | Freq: Two times a day (BID) | INTRAVENOUS | Status: DC
  Administered 2018-05-02 – 2018-05-05 (×7): 3 mL via INTRAVENOUS

## 2018-05-02 MED ORDER — FUROSEMIDE 10 MG/ML IJ SOLN
40.0000 mg | Freq: Two times a day (BID) | INTRAMUSCULAR | Status: DC
  Administered 2018-05-02 – 2018-05-03 (×3): 40 mg via INTRAVENOUS
  Filled 2018-05-02 (×3): qty 4

## 2018-05-02 MED ORDER — SODIUM CHLORIDE 0.9% FLUSH: 3.0000 mL | INTRAVENOUS | Status: DC | PRN

## 2018-05-02 MED ORDER — SODIUM CHLORIDE 0.9 % IV BOLUS
500.0000 mL | Freq: Once | INTRAVENOUS | Status: AC
  Administered 2018-05-02: 500 mL via INTRAVENOUS

## 2018-05-02 MED ORDER — MOMETASONE FURO-FORMOTEROL FUM 200-5 MCG/ACT IN AERO
2.0000 | INHALATION_SPRAY | Freq: Two times a day (BID) | RESPIRATORY_TRACT | Status: DC
  Administered 2018-05-02 – 2018-05-05 (×6): 2 via RESPIRATORY_TRACT
  Filled 2018-05-02: qty 8.8

## 2018-05-02 MED ORDER — DILTIAZEM HCL ER COATED BEADS 120 MG PO CP24
120.0000 mg | ORAL_CAPSULE | Freq: Every day | ORAL | Status: DC
  Administered 2018-05-02 – 2018-05-05 (×4): 120 mg via ORAL
  Filled 2018-05-02 (×4): qty 1

## 2018-05-02 NOTE — H&P (Signed)
History and Physical    CONNIE LASATER YQM:[id number] DOB: 11-Jan-1951 DOA: 05/02/2018  PCP: Octavio Graves, DO  Patient coming from: home  I have personally briefly reviewed patient's old medical records in [place]  Chief Complaint: confusion, lethargy and weakness  HPI: [name] is a 67 y.o. female with medical history significant of diastolic CHF, COPD, atrial fibrillation and chronic resp failure on home oxygen, presents with worsening weakness, lethargy and confusoin. She was seen in ED on 7/9 for weakness and work up was unrevealing so she was sent home. Her progressive weakness has gotten worse. Family says she feels like dead weight. She has not had any fever. Husband says she has been coughing for the last 4 weeks. She has not had any chest pain, but notes that she has been swelling more recently. She has been more confused lately and has been sleeping more.  ED Course: chest xray indicated decompensated CHF, BNP mildly elevated. EKG did not show any acute changes. ABG shows mild increase in pCO2. She has been referred for admission  Review of Systems: As per HPI otherwise 10 point review of systems negative.    Past Medical History:  Diagnosis Date  . Acute on chronic diastolic (congestive) heart failure ([place])   . Anxiety disorder   . Aortic stenosis    Status post St. Jude mechanical AVR 2007  . Asthma   . Atrial fibrillation ([place])   . Carcinoid tumor of colon 2007  . Coronary atherosclerosis of native coronary artery    Status post CABG 2007  . GERD (gastroesophageal reflux disease)   . History of colonoscopy 2003   Dr. Gala Romney - normal  . Hyperlipidemia   . Macromastia   . Pedal edema    Bilateral, chronic  . Peptic stricture of esophagus 10/04/2010   GE junction on last EGD by Dr. Gala Romney, benign biopsies  . RLS (restless legs syndrome)   . Schatzki's ring     Past Surgical History:  Procedure Laterality Date  . ABDOMINAL HYSTERECTOMY    . AORTIC  VALVE REPLACEMENT  2007   #25 mm St. 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 APPENDECTOMY  2007  . BACK SURGERY     lumbar 4 and 5   . BIOPSY  02/05/2012   RMR:Two tongues of salmon-colored epithelium distal esophagus, very short-segment Barrett's s/p bx/Small hiatal hernia, otherwise normal stomach, D1, D2. Status post esophageal dilation. Biopsy showed GERD.  . Breast cyst removed     bilateral  . BREAST REDUCTION SURGERY    . CESAREAN SECTION    . 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 COLONOSCOPY  02/05/2012   BMW:UXLKGM rectum, sigmoid diverticulosis,descending colon polyp , tubular adenoma  . CORONARY ARTERY BYPASS GRAFT     06/2006 - RIMA to RCA, SVG to RCA  . ESOPHAGOGASTRODUODENOSCOPY  10/03/2010   Dr. Gala Romney- schatzki's ring, shoft peptic stricture at GE junction.  . ESOPHAGOGASTRODUODENOSCOPY (EGD) WITH PROPOFOL N/A 02/08/2018   Procedure: ESOPHAGOGASTRODUODENOSCOPY (EGD) WITH PROPOFOL;  Surgeon: Daneil Dolin, MD;  Location: [facility];  Service: Endoscopy;  Laterality: N/A;  8:15am  . FOOT SURGERY     bilateral bunionectomy  . HERNIA REPAIR     with mesh  . LAPAROTOMY  2007   small bowel resection secondary to small bowel obstruction  . MALONEY DILATION  02/05/2012   Procedure: Venia Minks DILATION;  Surgeon: Daneil Dolin, MD;  Location: [facility] OR[facility];  Service: Endoscopy;  Laterality: N/A;  24mm   . MALONEY DILATION N/A 02/08/2018   Procedure: Venia Minks DILATION;  Surgeon: Daneil Dolin, MD;  Location: [facility];  Service: Endoscopy;  Laterality: N/A;  . Teeth removal       reports that she quit smoking about 29 years ago. Her smoking use included cigarettes. She has a 20.00 pack-year smoking history. She has never used smokeless tobacco. She reports that she does not drink alcohol or use drugs.  Allergies  Allergen Reactions  . Penicillins Hives, Itching, Swelling and Rash    Has patient had a PCN  reaction causing immediate rash, facial/tongue/throat swelling, SOB or lightheadedness with hypotension: Yes Has patient had a PCN reaction causing severe rash involving mucus membranes or skin necrosis: Yes Has patient had a PCN reaction that required hospitalization: unknown Has patient had a PCN reaction occurring within the last 10 years: No If all of the above answers are "NO", then may proceed with Cephalosporin use.    Throat swelling    Family History  Problem Relation Age of Onset  . Stroke Mother   . Cancer Father   . Anesthesia problems Neg Hx   . Hypotension Neg Hx   . Malignant hyperthermia Neg Hx   . Pseudochol deficiency Neg Hx      Prior to Admission medications   Medication Sig Start Date End Date Taking? Authorizing Provider  albuterol (PROVENTIL HFA;VENTOLIN HFA) 108 (90 BASE) MCG/ACT inhaler Inhale 1-2 puffs into the lungs every 6 (six) hours as needed for wheezing or shortness of breath.    Yes [provider]  allopurinol (ZYLOPRIM) 100 MG tablet Take 100 mg by mouth daily.   Yes [provider]  ALPRAZolam Duanne Moron) 1 MG tablet Take 1 mg by mouth every morning. *May take one tablet two times daily as needed for anxiety* 09/20/16  Yes [provider]  benzonatate (TESSALON) 200 MG capsule Take 1 capsule (200 mg total) by mouth 2 (two) times daily as needed for cough. Patient taking differently: Take 200 mg by mouth 2 (two) times daily. *May take one capsule every 8 hours as needed for cough* 03/19/17  Yes Mariel Aloe, MD  bisoprolol (ZEBETA) 5 MG tablet Take 1 tablet (5 mg total) by mouth daily. 04/01/18  Yes Barton Dubois, MD  budesonide-formoterol [facility]) 160-4.5 MCG/ACT inhaler Inhale 2 puffs into the lungs 2 (two) times daily. 03/31/18  Yes Barton Dubois, MD  calcium carbonate (OS-CAL) 600 MG TABS tablet Take 600 mg by mouth 2 (two) times daily.   Yes [provider]  cetirizine (ZYRTEC) 10 MG tablet Take 10 mg by mouth  daily.     Yes [provider]  clobetasol cream (TEMOVATE) 8.52 % Apply 1 application topically 2 (two) times daily as needed (FOR SKIN IRRITATION/REDNESS & BLISTERING ON LEGS).  02/26/17  Yes [provider]  CRESTOR 10 MG tablet Take 10 mg by mouth at bedtime.  12/02/13  Yes [provider]  cyclobenzaprine (FLEXERIL) 10 MG tablet Take 10 mg by mouth 3 (three) times daily as needed for muscle spasms.    Yes [provider]  diltiazem (CARDIZEM CD) 120 MG 24 hr capsule Take 1 capsule (120 mg total) by mouth daily. 04/01/18  Yes Barton Dubois, MD  fluconazole (DIFLUCAN) 100 MG tablet Take 100 mg by mouth daily. 03/02/18  Yes [provider]  furosemide (LASIX) 40 MG tablet Take 1 tablet (40 mg total) by  mouth 2 (two) times daily. 03/31/18  Yes Barton Dubois, MD  HYDROcodone-acetaminophen [facility]) 10-325 MG per tablet Take 1 tablet by mouth at bedtime. *May take one tablet every 12 hours as needed for pain*   Yes [provider]  levothyroxine (SYNTHROID, LEVOTHROID) 50 MCG tablet Take 50 mcg by mouth daily before breakfast.    Yes [provider]  magnesium oxide (MAG-OX) 400 MG tablet Take 400 mg by mouth at bedtime.   Yes [provider]  mometasone (NASONEX) 50 MCG/ACT nasal spray Place 2 sprays into the nose at bedtime. 03/03/18  Yes [provider]  Multiple Vitamin (MULTIVITAMIN WITH MINERALS) TABS tablet Take 1 tablet by mouth daily.   Yes [provider]  omeprazole (PRILOSEC) 20 MG capsule Take 20 mg by mouth daily.   Yes [provider]  potassium chloride (K-DUR) 10 MEQ tablet Take 1 tablet (10 mEq total) by mouth daily. 04/03/17 01/29/19 Yes Lendon Colonel, NP  rOPINIRole (REQUIP) 3 MG tablet Take 3 mg by mouth 2 (two) times daily.    Yes [provider]  temazepam (RESTORIL) 15 MG capsule Take 15 mg by mouth at bedtime.    Yes [provider]  warfarin (COUMADIN) 2 MG  tablet Takes one tablet (2mg ) everyday Patient taking differently: Take 3 mg by mouth every evening.  03/03/17  Yes Branch, Alphonse Guild, MD  azithromycin (ZITHROMAX) 250 MG tablet Take 1 tablet (250 mg total) by mouth daily. Take first 2 tablets together, then 1 every day until finished. Patient not taking: Reported on 05/02/2018 04/27/18   Nat Christen, MD  chlorpheniramine-HYDROcodone [facility]) 10-8 MG/5ML SUER Take 5 mLs by mouth every 12 (twelve) hours as needed for cough. Patient not taking: Reported on 05/02/2018 04/27/18   Nat Christen, MD  OXYGEN Inhale 2 L into the lungs daily.     [provider]  predniSONE (DELTASONE) 10 MG tablet Take 4 tablets by mouth daily for 5 days and stop prednisone. Patient not taking: Reported on 04/27/2018 03/31/18   Barton Dubois, MD    Physical Exam: Vitals:   05/02/18 0830 05/02/18 0900 05/02/18 0930 05/02/18 1000  BP: 111/71 125/70 111/75 117/77  Pulse:  91 88 98  Resp: (!) 23 (!) 24 19 (!) 27  Temp:      TempSrc:      SpO2:  99% 100% 97%  Weight:      Height:        Constitutional: NAD, calm, comfortable Vitals:   05/02/18 0830 05/02/18 0900 05/02/18 0930 05/02/18 1000  BP: 111/71 125/70 111/75 117/77  Pulse:  91 88 98  Resp: (!) 23 (!) 24 19 (!) 27  Temp:      TempSrc:      SpO2:  99% 100% 97%  Weight:      Height:       Eyes: PERRL, lids and conjunctivae normal ENMT: Mucous membranes are moist. Posterior pharynx clear of any exudate or lesions.Normal dentition.  Neck: normal, supple, no masses, no thyromegaly Respiratory: bilateral crackles and wheezes. Normal respiratory effort. No accessory muscle use.  Cardiovascular: Regular rate and rhythm, no murmurs / rubs / gallops. 2+ edema bilaterally. 2+ pedal pulses. No carotid bruits.  Abdomen: no tenderness, no masses palpated. No hepatosplenomegaly. Bowel sounds positive.  Musculoskeletal: no clubbing / cyanosis. No joint deformity upper and lower extremities. Good  ROM, no contractures. Normal muscle tone.  Skin: venous stasis changes in LE bilaterally Neurologic: CN 2-12 grossly intact.  Sensation intact, DTR normal. Moving all extremities spontaneously, does not participate with exam Psychiatric: confused, slow to respond.    Labs on Admission: I have personally reviewed following labs and imaging studies  CBC: Recent Labs  Lab 04/27/18 1558 05/02/18 0753  WBC 8.5 10.2  NEUTROABS 5.2  --   HGB 10.4* 10.2*  HCT 34.8* 34.8*  MCV 90.2 91.1  PLT 202 902   Basic Metabolic Panel: Recent Labs  Lab 04/27/18 1558 05/02/18 0753  NA 141 137  K 3.3* 4.3  CL 98 96*  CO2 35* 34*  GLUCOSE 83 77  BUN 21 30*  CREATININE 1.37* 1.75*  CALCIUM 8.8* 8.7*   GFR: Estimated Creatinine Clearance: 38 mL/min (A) (by C-G formula based on SCr of 1.75 mg/dL (H)). Liver Function Tests: Recent Labs  Lab 05/02/18 0753  AST 98*  ALT 109*  ALKPHOS 88  BILITOT 1.1  PROT 6.6  ALBUMIN 3.4*   No results for input(s): LIPASE, AMYLASE in the last 168 hours. No results for input(s): AMMONIA in the last 168 hours. Coagulation Profile: Recent Labs  Lab 04/26/18  INR 3.2*   Cardiac Enzymes: Recent Labs  Lab 04/27/18 1558 04/27/18 1921 05/02/18 0753  TROPONINI 0.03* 0.03* 0.06*   BNP (last 3 results) No results for input(s): PROBNP in the last 8760 hours. HbA1C: No results for input(s): HGBA1C in the last 72 hours. CBG: No results for input(s): GLUCAP in the last 168 hours. Lipid Profile: No results for input(s): CHOL, HDL, LDLCALC, TRIG, CHOLHDL, LDLDIRECT in the last 72 hours. Thyroid Function Tests: No results for input(s): TSH, T4TOTAL, FREET4, T3FREE, THYROIDAB in the last 72 hours. Anemia Panel: No results for input(s): VITAMINB12, FOLATE, FERRITIN, TIBC, IRON, RETICCTPCT in the last 72 hours. Urine analysis: No results found for: COLORURINE, APPEARANCEUR, LABSPEC, PHURINE, GLUCOSEU, HGBUR, BILIRUBINUR, KETONESUR, PROTEINUR, UROBILINOGEN,  NITRITE, LEUKOCYTESUR  Radiological Exams on Admission: Dg Chest 2 View  Result Date: 05/02/2018 CLINICAL DATA:  CHF, short of breath EXAM: CHEST - 2 VIEW COMPARISON:  04/27/2018 FINDINGS: Progression of vascular congestion and bilateral airspace disease right greater than left. Progression of small bilateral pleural effusions. Prior mitral valve replacement with cardiac enlargement. IMPRESSION: Progressive bilateral airspace disease and small effusions consistent with congestive heart failure and edema. Electronically Signed   By: Franchot Gallo M.D.   On: 05/02/2018 08:53    EKG: Independently reviewed. Atrial fibrillation without acute changes  Assessment/Plan Active Problems:   Hyperlipidemia   Atrial fibrillation (HCC)   GERD   Aortic valve replaced   Acute on chronic diastolic congestive heart failure (HCC)   Acute on chronic respiratory failure with hypercapnia (HCC)   CHF exacerbation (HCC)   Acute metabolic encephalopathy   CKD (chronic kidney disease) stage 3, GFR 30-59 ml/min (HCC)   Hypothyroidism   Generalized weakness     1. Acute on chronic diastolic CHF. Will start on intravenous lasix 40mg  bid. Monitor intake and output. EF on Echo done 6/19 with normal EF. Continue beta blocker 2. Metabolic encephalopathy related to hypercapnia. Urinalysis has been ordered. She is also on may sedative medications that could be playing a role. Will hold sedatives. Will start on bipap 3. Acute on chronic resp failure with hypercapnia. Started on bipap. Continue on oxygen. Repeat abg in AM 4. CKD stage 3. Creatinine mildly increased from 7/9. Possibly related to low output. Continue to monitor with diuresis 5. Chronic atrial fibrillation. Rate controlled. Continue on BB and diltiazem. Anticoagulated with coumadin 6. COPD. She does  have some wheezing. Will continue on neb treatments. contineu on symbicort 7. History of mechanical aortic valve. Anticoagulated with coumadin. INR  therapeutic 8. HLD. Continue on statin 9. Hypothyroidism. Continue on levothyroxine 10. Generalized weakness. Will consult physical therapy 11. Anxiety. Will hold xanax for now since she is confused.  12. Chronic pain. Hold norco/flexeril since she is confused  DVT prophylaxis: warfarin  Code Status: full code  Family Communication: discussed with husband and son at the bedside  Disposition Plan: discharge home once improved, although may need placement pending physical therapy evaluation  Consults called:   Admission status: inpatient, stepdown   Kathie Dike MD [facility] Pager [phone]  If 7PM-7AM, please contact night-coverage [email or web address] Password [facility]  05/02/2018, 12:38 PM

## 2018-05-02 NOTE — Progress Notes (Signed)
ANTICOAGULATION CONSULT NOTE - Initial Consult   Pharmacy Consult for:  warfarin dosing  Indication: atrial fibrillation INR Goal range: 2-3 INR today: 7.06 Albumin: 3.4 AST/ALT: 98/109 Home warfarin dose: 3mg  daily, except 2mg  on Sunday and Thursday (19mg  weekly) Last warfarin dose: 05-01-18 Severe Drug Interactions: pt was taking fluconazole and azithromycin prior to admission Concurrent anti-platelet meds: n/a Factors affecting warfarin sensitivity: CHF, renal insufficiency, LFT elevation   Assessment: This ia 62 yof with atrial fibrillation (S/P St. Jude valve in 2007), admitted for acute CHF exacerbation.  She was taking 19mg  of warfarin weekly as an outpatient. Her INR is supra-therapeutic today, possibly due to drug interactions with use of fluconazole (major interaction) and azithromycin courses finished yesterday.  Liver congestion may also be an issue for this patient.  Goal of Therapy:  INR 2-3 Monitor platelets by anticoagulation protocol: Yes   Plan:  Hold warfarin today for supra-therapeutic INR  Daily CBC and INR-PT ordered Monitor for S-S of bleeding.  Patient Measurements: Height: 5\' 8"  (172.7 cm) Weight: 208 lb 1.8 oz (94.4 kg) IBW/kg (Calculated) : 63.9 Heparin Dosing Weight: HEPARIN DW (KG): 84.2  Vital Signs: Temp: 98.2 F (36.8 C) (07/14 1328) Temp Source: Axillary (07/14 1328) BP: 124/82 (07/14 1230) Pulse Rate: 98 (07/14 1230)  Labs: Recent Labs    05/02/18 0753  HGB 10.2*  HCT 34.8*  PLT 191  LABPROT 60.3*  INR 7.06*  CREATININE 1.75*  TROPONINI 0.06*   Estimated Creatinine Clearance: 38 mL/min (A) (by C-G formula based on SCr of 1.75 mg/dL (H)).  Medical History: Past Medical History:  Diagnosis Date  . Acute on chronic diastolic (congestive) heart failure ([place])   . Anxiety disorder   . Aortic stenosis    Status post St. Jude mechanical AVR 2007  . Asthma   . Atrial fibrillation ([place])   . Carcinoid tumor of colon 2007  .  Coronary atherosclerosis of native coronary artery    Status post CABG 2007  . GERD (gastroesophageal reflux disease)   . History of colonoscopy 2003   Dr. Gala Romney - normal  . Hyperlipidemia   . Macromastia   . Pedal edema    Bilateral, chronic  . Peptic stricture of esophagus 10/04/2010   GE junction on last EGD by Dr. Gala Romney, benign biopsies  . RLS (restless legs syndrome)   . Schatzki's ring     Medications:  Medications Prior to Admission  Medication Sig Dispense Refill Last Dose  . albuterol (PROVENTIL HFA;VENTOLIN HFA) 108 (90 BASE) MCG/ACT inhaler Inhale 1-2 puffs into the lungs every 6 (six) hours as needed for wheezing or shortness of breath.    Past Month at Unknown time  . allopurinol (ZYLOPRIM) 100 MG tablet Take 100 mg by mouth daily.   05/01/2018 at Unknown time  . ALPRAZolam (XANAX) 1 MG tablet Take 1 mg by mouth every morning. *May take one tablet two times daily as needed for anxiety*   05/01/2018 at Unknown time  . benzonatate (TESSALON) 200 MG capsule Take 1 capsule (200 mg total) by mouth 2 (two) times daily as needed for cough. (Patient taking differently: Take 200 mg by mouth 2 (two) times daily. *May take one capsule every 8 hours as needed for cough*) 20 capsule 0 unkonwn  . bisoprolol (ZEBETA) 5 MG tablet Take 1 tablet (5 mg total) by mouth daily. 30 tablet 3 05/01/2018 at 1400  . budesonide-formoterol (SYMBICORT) 160-4.5 MCG/ACT inhaler Inhale 2 puffs into the lungs 2 (two) times daily. 1 Inhaler  3 05/01/2018 at Unknown time  . calcium carbonate (OS-CAL) 600 MG TABS tablet Take 600 mg by mouth 2 (two) times daily.   05/01/2018 at Unknown time  . cetirizine (ZYRTEC) 10 MG tablet Take 10 mg by mouth daily.     05/01/2018 at Unknown time  . clobetasol cream (TEMOVATE) 8.11 % Apply 1 application topically 2 (two) times daily as needed (FOR SKIN IRRITATION/REDNESS & BLISTERING ON LEGS).    Past Month at Unknown time  . CRESTOR 10 MG tablet Take 10 mg by mouth at bedtime.     05/01/2018 at Unknown time  . cyclobenzaprine (FLEXERIL) 10 MG tablet Take 10 mg by mouth 3 (three) times daily as needed for muscle spasms.    Past Week at Unknown time  . diltiazem (CARDIZEM CD) 120 MG 24 hr capsule Take 1 capsule (120 mg total) by mouth daily. 30 capsule 2 05/01/2018 at Unknown time  . fluconazole (DIFLUCAN) 100 MG tablet Take 100 mg by mouth daily.  0 05/01/2018 at Unknown time  . furosemide (LASIX) 40 MG tablet Take 1 tablet (40 mg total) by mouth 2 (two) times daily. 60 tablet 2 05/01/2018 at Unknown time  . HYDROcodone-acetaminophen (NORCO) 10-325 MG per tablet Take 1 tablet by mouth at bedtime. *May take one tablet every 12 hours as needed for pain*   05/01/2018 at Unknown time  . levothyroxine (SYNTHROID, LEVOTHROID) 50 MCG tablet Take 50 mcg by mouth daily before breakfast.    05/01/2018 at Unknown time  . magnesium oxide (MAG-OX) 400 MG tablet Take 400 mg by mouth at bedtime.   05/01/2018 at Unknown time  . mometasone (NASONEX) 50 MCG/ACT nasal spray Place 2 sprays into the nose at bedtime.  0 05/01/2018 at Unknown time  . Multiple Vitamin (MULTIVITAMIN WITH MINERALS) TABS tablet Take 1 tablet by mouth daily.   05/01/2018 at Unknown time  . omeprazole (PRILOSEC) 20 MG capsule Take 20 mg by mouth daily.   05/01/2018 at Unknown time  . potassium chloride (K-DUR) 10 MEQ tablet Take 1 tablet (10 mEq total) by mouth daily. 90 tablet 3 05/01/2018 at Unknown time  . rOPINIRole (REQUIP) 3 MG tablet Take 3 mg by mouth 2 (two) times daily.    05/01/2018 at Unknown time  . temazepam (RESTORIL) 15 MG capsule Take 15 mg by mouth at bedtime.    05/01/2018 at Unknown time  . warfarin (COUMADIN) 2 MG tablet Takes one tablet (2mg ) everyday (Patient taking differently: Take 3 mg by mouth every evening. ) 45 tablet 3 05/01/2018 at 1400  . azithromycin (ZITHROMAX) 250 MG tablet Take 1 tablet (250 mg total) by mouth daily. Take first 2 tablets together, then 1 every day until finished. (Patient not taking:  Reported on 05/02/2018) 6 tablet 0 Completed Course at Unknown time  . chlorpheniramine-HYDROcodone (TUSSIONEX PENNKINETIC ER) 10-8 MG/5ML SUER Take 5 mLs by mouth every 12 (twelve) hours as needed for cough. (Patient not taking: Reported on 05/02/2018) 120 mL 0 Completed Course at Unknown time  . OXYGEN Inhale 2 L into the lungs daily.    04/27/2018 at Unknown time  . predniSONE (DELTASONE) 10 MG tablet Take 4 tablets by mouth daily for 5 days and stop prednisone. (Patient not taking: Reported on 04/27/2018) 20 tablet 0 Completed Course at Unknown time     [name], Pharm. D. Clinical Pharmacist 05/02/2018 2:08 PM

## 2018-05-02 NOTE — Progress Notes (Signed)
Patient according to son became lethargic at home after about 2 days of sleeping. Husband could not get her up. PCO2 61.9 on 3 liters this am. She still is a little confused. Evidently she at some time had a CPAP at home but now only wears 2 liters when she sleeps. Have place her on BiPAP for tonight will get ABG in am to see if we can correct PCO2.

## 2018-05-02 NOTE — ED Notes (Signed)
Pt placed on purewick 

## 2018-05-02 NOTE — ED Notes (Signed)
CRITICAL VALUE ALERT  Critical Value:  Troponin 0.06  Date & Time Notied:  05/02/18  0735  Provider Notified: Dr Lacinda Axon   Orders Received/Actions taken: none at this time

## 2018-05-02 NOTE — ED Provider Notes (Signed)
[facility] EMERGENCY DEPARTMENT Provider Note   CSN: [id number] Arrival date & time: 05/02/18  0736     History   Chief Complaint Chief Complaint  Patient presents with  . Weakness    HPI [name] is a 67 y.o. female.  Level 5 caveat for acuity of condition.  Patient presents with a worsening weakness and dyspnea for 2 days.  Husband reports she is unable to get up and walk.  She has been sleeping excessively.  Decreased oral intake.  She is on chronic home oxygen at 2 L/min.  No fever, sweats, chills, chest pain.  Severity of symptoms is moderate     Past Medical History:  Diagnosis Date  . Acute on chronic diastolic (congestive) heart failure ([name])   . Anxiety disorder   . Aortic stenosis    Status post St. Jude mechanical AVR 2007  . Asthma   . Atrial fibrillation ([name])   . Carcinoid tumor of colon 2007  . Coronary atherosclerosis of native coronary artery    Status post CABG 2007  . GERD (gastroesophageal reflux disease)   . History of colonoscopy 2003   Dr. Gala Romney - normal  . Hyperlipidemia   . Macromastia   . Pedal edema    Bilateral, chronic  . Peptic stricture of esophagus 10/04/2010   GE junction on last EGD by Dr. Gala Romney, benign biopsies  . RLS (restless legs syndrome)   . Schatzki's ring     Patient Active Problem List   Diagnosis Date Noted  . Physical deconditioning   . Coronary artery disease of bypass graft of native heart with stable angina pectoris ([name])   . Acute renal failure superimposed on stage 3 chronic kidney disease ([name])   . Acute on chronic respiratory failure with hypercapnia ([name]) 03/24/2018  . Hypomagnesemia 03/24/2018  . Hypokalemia 03/24/2018  . Hypophosphatemia 03/24/2018  . Atrial fibrillation with RVR ([place]) 03/24/2018  . Elevated troponin 03/24/2018  . Coughing 01/14/2018  . Anemia 01/14/2018  . Hx of adenomatous colonic polyps 01/14/2018  . Acute on chronic diastolic congestive heart failure ([name]) 12/17/2016  .  COPD exacerbation ([name]) 12/17/2016  . Bilateral leg edema 07/05/2014  . Encounter for therapeutic drug monitoring 11/15/2013  . Chronic venous insufficiency 08/26/2011  . Chronic anticoagulation 01/11/2011  . GERD 09/16/2010  . DYSPHAGIA 09/16/2010  . Aortic valve replaced 06/05/2009  . Hyperlipidemia 06/04/2009  . Coronary atherosclerosis of native coronary artery 06/04/2009  . Atrial fibrillation ([name]) 06/04/2009    Past Surgical History:  Procedure Laterality Date  . ABDOMINAL HYSTERECTOMY    . AORTIC VALVE REPLACEMENT  2007   #25 mm St. 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 APPENDECTOMY  2007  . BACK SURGERY     lumbar 4 and 5   . BIOPSY  02/05/2012   RMR:Two tongues of salmon-colored epithelium distal esophagus, very short-segment Barrett's s/p bx/Small hiatal hernia, otherwise normal stomach, D1, D2. Status post esophageal dilation. Biopsy showed GERD.  . Breast cyst removed     bilateral  . BREAST REDUCTION SURGERY    . CESAREAN SECTION    . 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 COLONOSCOPY  02/05/2012   KWI:OXBDZH rectum, sigmoid diverticulosis,descending colon polyp , tubular adenoma  . CORONARY ARTERY BYPASS GRAFT     06/2006 - RIMA to RCA, SVG to RCA  . ESOPHAGOGASTRODUODENOSCOPY  10/03/2010   Dr. Gala Romney- schatzki's ring, shoft peptic stricture at  GE junction.  . ESOPHAGOGASTRODUODENOSCOPY (EGD) WITH PROPOFOL N/A 02/08/2018   Procedure: ESOPHAGOGASTRODUODENOSCOPY (EGD) WITH PROPOFOL;  Surgeon: Amy Dolin, MD;  Location: [facility] ENDO SUITE;  Service: Endoscopy;  Laterality: N/A;  8:15am  . FOOT SURGERY     bilateral bunionectomy  . HERNIA REPAIR     with mesh  . LAPAROTOMY  2007   small bowel resection secondary to small bowel obstruction  . MALONEY DILATION  02/05/2012   Procedure: [name] DILATION;  Surgeon: Amy Dolin, MD;  Location: [facility] ORS;  Service: Endoscopy;  Laterality: N/A;  82mm   .  MALONEY DILATION N/A 02/08/2018   Procedure: [name] DILATION;  Surgeon: Amy Dolin, MD;  Location: [facility] ENDO SUITE;  Service: Endoscopy;  Laterality: N/A;  . Teeth removal       OB History   None      Home Medications    Prior to Admission medications   Medication Sig Start Date End Date Taking? Authorizing Provider  albuterol (PROVENTIL HFA;VENTOLIN HFA) 108 (90 BASE) MCG/ACT inhaler Inhale 1-2 puffs into the lungs every 6 (six) hours as needed for wheezing or shortness of breath.    Yes [provider]  allopurinol (ZYLOPRIM) 100 MG tablet Take 100 mg by mouth daily.   Yes [provider]  ALPRAZolam Duanne Moron) 1 MG tablet Take 1 mg by mouth every morning. *May take one tablet two times daily as needed for anxiety* 09/20/16  Yes [provider]  benzonatate (TESSALON) 200 MG capsule Take 1 capsule (200 mg total) by mouth 2 (two) times daily as needed for cough. Patient taking differently: Take 200 mg by mouth 2 (two) times daily. *May take one capsule every 8 hours as needed for cough* 03/19/17  Yes Mariel Aloe, MD  bisoprolol (ZEBETA) 5 MG tablet Take 1 tablet (5 mg total) by mouth daily. 04/01/18  Yes Barton Dubois, MD  budesonide-formoterol [facility]) 160-4.5 MCG/ACT inhaler Inhale 2 puffs into the lungs 2 (two) times daily. 03/31/18  Yes Barton Dubois, MD  calcium carbonate (OS-CAL) 600 MG TABS tablet Take 600 mg by mouth 2 (two) times daily.   Yes [provider]  cetirizine (ZYRTEC) 10 MG tablet Take 10 mg by mouth daily.     Yes [provider]  clobetasol cream (TEMOVATE) 9.14 % Apply 1 application topically 2 (two) times daily as needed (FOR SKIN IRRITATION/REDNESS & BLISTERING ON LEGS).  02/26/17  Yes [provider]  CRESTOR 10 MG tablet Take 10 mg by mouth at bedtime.  12/02/13  Yes [provider]  cyclobenzaprine (FLEXERIL) 10 MG tablet Take 10 mg by mouth 3 (three) times daily as needed for muscle spasms.     Yes [provider]  diltiazem (CARDIZEM CD) 120 MG 24 hr capsule Take 1 capsule (120 mg total) by mouth daily. 04/01/18  Yes Barton Dubois, MD  fluconazole (DIFLUCAN) 100 MG tablet Take 100 mg by mouth daily. 03/02/18  Yes [provider]  furosemide (LASIX) 40 MG tablet Take 1 tablet (40 mg total) by mouth 2 (two) times daily. 03/31/18  Yes Barton Dubois, MD  HYDROcodone-acetaminophen [facility]) 10-325 MG per tablet Take 1 tablet by mouth at bedtime. *May take one tablet every 12 hours as needed for pain*   Yes [provider]  levothyroxine (SYNTHROID, LEVOTHROID) 50 MCG tablet Take 50 mcg by mouth daily before breakfast.    Yes [provider]  magnesium oxide (MAG-OX) 400 MG tablet Take 400 mg by mouth at  bedtime.   Yes [provider]  mometasone (NASONEX) 50 MCG/ACT nasal spray Place 2 sprays into the nose at bedtime. 03/03/18  Yes [provider]  Multiple Vitamin (MULTIVITAMIN WITH MINERALS) TABS tablet Take 1 tablet by mouth daily.   Yes [provider]  omeprazole (PRILOSEC) 20 MG capsule Take 20 mg by mouth daily.   Yes [provider]  potassium chloride (K-DUR) 10 MEQ tablet Take 1 tablet (10 mEq total) by mouth daily. 04/03/17 01/29/19 Yes Lendon Colonel, NP  rOPINIRole (REQUIP) 3 MG tablet Take 3 mg by mouth 2 (two) times daily.    Yes [provider]  temazepam (RESTORIL) 15 MG capsule Take 15 mg by mouth at bedtime.    Yes [provider]  warfarin (COUMADIN) 2 MG tablet Takes one tablet (2mg ) everyday Patient taking differently: Take 3 mg by mouth every evening.  03/03/17  Yes Branch, Alphonse Guild, MD  azithromycin (ZITHROMAX) 250 MG tablet Take 1 tablet (250 mg total) by mouth daily. Take first 2 tablets together, then 1 every day until finished. Patient not taking: Reported on 05/02/2018 04/27/18   Nat Christen, MD  chlorpheniramine-HYDROcodone [facility]) 10-8 MG/5ML SUER Take 5 mLs  by mouth every 12 (twelve) hours as needed for cough. Patient not taking: Reported on 05/02/2018 04/27/18   Nat Christen, MD  OXYGEN Inhale 2 L into the lungs daily.     [provider]  predniSONE (DELTASONE) 10 MG tablet Take 4 tablets by mouth daily for 5 days and stop prednisone. Patient not taking: Reported on 04/27/2018 03/31/18   Barton Dubois, MD    Family History Family History  Problem Relation Age of Onset  . Stroke Mother   . Cancer Father   . Anesthesia problems Neg Hx   . Hypotension Neg Hx   . Malignant hyperthermia Neg Hx   . Pseudochol deficiency Neg Hx     Social History Social History   Tobacco Use  . Smoking status: Former Smoker    Packs/day: 1.00    Years: 20.00    Pack years: 20.00    Types: Cigarettes    Last attempt to quit: 10/20/1988    Years since quitting: 29.5  . Smokeless tobacco: Never Used  Substance Use Topics  . Alcohol use: No    Alcohol/week: 0.0 oz  . Drug use: No     Allergies   Penicillins   Review of Systems Review of Systems  Unable to perform ROS: Acuity of condition     Physical Exam Updated Vital Signs BP 117/77   Pulse 98   Temp 98.4 F (36.9 C) (Oral)   Resp (!) 27   Ht 5\' 8"  (1.727 m)   Wt 94.3 kg (207 lb 14.4 oz)   SpO2 97%   BMI 31.61 kg/m   Physical Exam  Constitutional: She is oriented to person, place, and time.  Pale, able to communicate.  HENT:  Head: Normocephalic and atraumatic.  Eyes: Conjunctivae are normal.  Neck: Neck supple.  Cardiovascular: Normal rate and regular rhythm.  Pulmonary/Chest: Effort normal.  Questionable rales  Abdominal: Soft. Bowel sounds are normal.  Musculoskeletal: Normal range of motion.  Neurological: She is alert and oriented to person, place, and time.  Skin:  Chronic lower extremity venous stasis ulcers.  Psychiatric: She has a normal mood and affect. Her behavior is normal.  Nursing note and vitals reviewed.    ED Treatments / Results  Labs (all  labs ordered are  listed, but only abnormal results are displayed) Labs Reviewed  CBC - Abnormal; Notable for the following components:      Result Value   RBC 3.82 (*)    Hemoglobin 10.2 (*)    HCT 34.8 (*)    MCHC 29.3 (*)    RDW 16.6 (*)    All other components within normal limits  TROPONIN I - Abnormal; Notable for the following components:   Troponin I 0.06 (*)    All other components within normal limits  BRAIN NATRIURETIC PEPTIDE - Abnormal; Notable for the following components:   B Natriuretic Peptide 664.0 (*)    All other components within normal limits  COMPREHENSIVE METABOLIC PANEL - Abnormal; Notable for the following components:   Chloride 96 (*)    CO2 34 (*)    BUN 30 (*)    Creatinine, Ser 1.75 (*)    Calcium 8.7 (*)    Albumin 3.4 (*)    AST 98 (*)    ALT 109 (*)    GFR calc non Af Amer 29 (*)    GFR calc Af Amer 34 (*)    All other components within normal limits  URINALYSIS, ROUTINE W REFLEX MICROSCOPIC    EKG EKG Interpretation  Date/Time:  Sunday May 02 2018 07:45:35 EDT Ventricular Rate:  102 PR Interval:    QRS Duration: 92 QT Interval:  364 QTC Calculation: 456 R Axis:   77 Text Interpretation:  Atrial fibrillation Ventricular premature complex Low voltage, precordial leads Borderline repolarization abnormality Confirmed by Nat Christen [phone]) on 05/02/2018 8:43:26 AM   Radiology Dg Chest 2 View  Result Date: 05/02/2018 CLINICAL DATA:  CHF, short of breath EXAM: CHEST - 2 VIEW COMPARISON:  04/27/2018 FINDINGS: Progression of vascular congestion and bilateral airspace disease right greater than left. Progression of small bilateral pleural effusions. Prior mitral valve replacement with cardiac enlargement. IMPRESSION: Progressive bilateral airspace disease and small effusions consistent with congestive heart failure and edema. Electronically Signed   By: Franchot Gallo M.D.   On: 05/02/2018 08:53    Procedures Procedures (including critical  care time)  Medications Ordered in ED Medications  sodium chloride 0.9 % bolus 500 mL (500 mLs Intravenous New Bag/Given 05/02/18 0841)  furosemide (LASIX) injection 40 mg (40 mg Intravenous Given 05/02/18 0940)     Initial Impression / Assessment and Plan / ED Course  I have reviewed the triage vital signs and the nursing notes.  Pertinent labs & imaging results that were available during my care of the patient were reviewed by me and considered in my medical decision making (see chart for details).     Patient who has multiple health problems presents with worsening weakness and dyspnea.  Chest x-ray reveals moderate congestive heart failure.  Troponin minimally elevated, but this is not new.  IV Lasix.  Admit to general medicine.   CRITICAL CARE Performed by: Nat Christen Total critical care time: 30 minutes Critical care time was exclusive of separately billable procedures and treating other patients. Critical care was necessary to treat or prevent imminent or life-threatening deterioration. Critical care was time spent personally by me on the following activities: development of treatment plan with patient and/or surrogate as well as nursing, discussions with consultants, evaluation of patient's response to treatment, examination of patient, obtaining history from patient or surrogate, ordering and performing treatments and interventions, ordering and review of laboratory studies, ordering and review of radiographic studies, pulse oximetry and re-evaluation of patient's condition. Final Clinical Impressions(s) /  ED Diagnoses   Final diagnoses:  Acute congestive heart failure, unspecified heart failure type [facility])    ED Discharge Orders    None       Nat Christen, MD 05/02/18 1025

## 2018-05-02 NOTE — ED Triage Notes (Signed)
Pt here for generalized weakness and "confusion" per family.  Was seen on 04/27/18 and has not really been up out of bed, eating, drinking, or caring for self.  Pt is normally on oxygen and has been more sob than usual.

## 2018-05-02 NOTE — ED Notes (Signed)
Date and time results received: 05/02/18 11:20 AM  (use smartphrase ".now" to insert current time)  Test: ABG Critical Value:  PH 7.34 PCO2 61.9 PO2 80.5 Bicarb 30.2 Acid-base Excess 7.3  Name of Provider Notified: Lacinda Axon  Orders Received? Or Actions Taken?: Orders Received - See Orders for details

## 2018-05-02 NOTE — Progress Notes (Signed)
Pt and pts son were stating that pt did not bring her teeth with her and requesting a "softer diet but not pureed."  Soft diet ordered- adv in comments that pts original order is Heart healthy. Adv pt and son order is placed.

## 2018-05-03 DIAGNOSIS — N183 Chronic kidney disease, stage 3 (moderate): Secondary | ICD-10-CM

## 2018-05-03 DIAGNOSIS — I5033 Acute on chronic diastolic (congestive) heart failure: Principal | ICD-10-CM

## 2018-05-03 DIAGNOSIS — I4891 Unspecified atrial fibrillation: Secondary | ICD-10-CM

## 2018-05-03 DIAGNOSIS — R791 Abnormal coagulation profile: Secondary | ICD-10-CM | POA: Diagnosis present

## 2018-05-03 DIAGNOSIS — E039 Hypothyroidism, unspecified: Secondary | ICD-10-CM

## 2018-05-03 DIAGNOSIS — R531 Weakness: Secondary | ICD-10-CM

## 2018-05-03 DIAGNOSIS — E785 Hyperlipidemia, unspecified: Secondary | ICD-10-CM

## 2018-05-03 DIAGNOSIS — G9341 Metabolic encephalopathy: Secondary | ICD-10-CM

## 2018-05-03 DIAGNOSIS — J9622 Acute and chronic respiratory failure with hypercapnia: Secondary | ICD-10-CM

## 2018-05-03 DIAGNOSIS — Z952 Presence of prosthetic heart valve: Secondary | ICD-10-CM

## 2018-05-03 LAB — CBC
HEMATOCRIT: 32.7 % — AB (ref 36.0–46.0)
Hemoglobin: 9.6 g/dL — ABNORMAL LOW (ref 12.0–15.0)
MCH: 26.6 pg (ref 26.0–34.0)
MCHC: 29.4 g/dL — AB (ref 30.0–36.0)
MCV: 90.6 fL (ref 78.0–100.0)
Platelets: 180 10*3/uL (ref 150–400)
RBC: 3.61 MIL/uL — ABNORMAL LOW (ref 3.87–5.11)
RDW: 16.5 % — AB (ref 11.5–15.5)
WBC: 6.6 10*3/uL (ref 4.0–10.5)

## 2018-05-03 LAB — BLOOD GAS, ARTERIAL
Acid-base deficit: 11.3 mmol/L — ABNORMAL HIGH (ref 0.0–2.0)
Bicarbonate: 33.9 mmol/L — ABNORMAL HIGH (ref 20.0–28.0)
Drawn by: 105551
Expiratory PAP: 6
FIO2: 28
INSPIRATORY PAP: 14
LHR: 8 {breaths}/min
MECHANICAL RATE: 8
MODE: POSITIVE
O2 Saturation: 94.1 %
PCO2 ART: 58.1 mmHg — AB (ref 32.0–48.0)
PO2 ART: 72.8 mmHg — AB (ref 83.0–108.0)
pH, Arterial: 7.413 (ref 7.350–7.450)

## 2018-05-03 LAB — BASIC METABOLIC PANEL
Anion gap: 9 (ref 5–15)
BUN: 23 mg/dL (ref 8–23)
CALCIUM: 8.1 mg/dL — AB (ref 8.9–10.3)
CO2: 33 mmol/L — ABNORMAL HIGH (ref 22–32)
CREATININE: 1.29 mg/dL — AB (ref 0.44–1.00)
Chloride: 94 mmol/L — ABNORMAL LOW (ref 98–111)
GFR calc Af Amer: 49 mL/min — ABNORMAL LOW (ref >60)
GFR, EST NON AFRICAN AMERICAN: 42 mL/min — AB (ref >60)
GLUCOSE: 77 mg/dL (ref 70–99)
Potassium: 3.6 mmol/L (ref 3.5–5.1)
SODIUM: 136 mmol/L (ref 135–145)

## 2018-05-03 LAB — TROPONIN I: TROPONIN I: 0.06 ng/mL — AB (ref <0.03)

## 2018-05-03 LAB — PROTIME-INR
INR: 8.45 — AB
Prothrombin Time: 69.4 seconds — ABNORMAL HIGH (ref 11.4–15.2)

## 2018-05-03 MED ORDER — IPRATROPIUM-ALBUTEROL 0.5-2.5 (3) MG/3ML IN SOLN
3.0000 mL | Freq: Four times a day (QID) | RESPIRATORY_TRACT | Status: DC
  Administered 2018-05-03 – 2018-05-05 (×8): 3 mL via RESPIRATORY_TRACT
  Filled 2018-05-03 (×8): qty 3

## 2018-05-03 NOTE — Progress Notes (Signed)
ANTICOAGULATION CONSULT NOTE - Follow Up   Pharmacy Consult for:  warfarin dosing  Indication: atrial fibrillation INR Goal range: 2-3 INR today:8.45 Albumin: 3.4 (7-14) AST/ALT: 98/109 (7-14) Home warfarin dose: 3mg  daily, except 2mg  on Sunday and Thursday (19mg  weekly) Last warfarin dose: 05-01-18 Severe Drug Interactions: pt was taking fluconazole and azithromycin prior to admission Concurrent anti-platelet meds: n/a Factors affecting warfarin sensitivity: CHF, renal insufficiency, LFT elevation   Assessment: This ia 71 yof with atrial fibrillation (S/P St. Jude valve in 2007), admitted for acute CHF exacerbation.  She was taking 19mg  of warfarin weekly as an outpatient. Her INR is supra-therapeutic today, possibly due to drug interactions with use of fluconazole (major interaction) and azithromycin courses finished yesterday.  Liver congestion may also be an issue for this patient.  Goal of Therapy:  INR 2-3 Monitor platelets by anticoagulation protocol: Yes   Plan:  Hold warfarin today for supra-therapeutic INR  Daily CBC and INR-PT ordered Monitor for S-S of bleeding.  Patient Measurements: Height: 5\' 8"  (172.7 cm) Weight: 208 lb 1.8 oz (94.4 kg) IBW/kg (Calculated) : 63.9 Heparin Dosing Weight: HEPARIN DW (KG): 84.2  Vital Signs: Temp: 97.1 F (36.2 C) (07/15 0700) Temp Source: Oral (07/15 0700) BP: 112/81 (07/15 0700) Pulse Rate: 82 (07/15 0700)  Labs: Recent Labs    05/02/18 0753 05/02/18 1509 05/02/18 1931 05/03/18 0236  HGB 10.2*  --   --  9.6*  HCT 34.8*  --   --  32.7*  PLT 191  --   --  180  LABPROT 60.3*  --   --  69.4*  INR 7.06*  --   --  8.45*  CREATININE 1.75*  --   --  1.29*  TROPONINI 0.06* 0.06* 0.06* 0.06*   Estimated Creatinine Clearance: 51.5 mL/min (A) (by C-G formula based on SCr of 1.29 mg/dL (H)).  Medical History: Past Medical History:  Diagnosis Date  . Acute on chronic diastolic (congestive) heart failure ([place])   . Anxiety  disorder   . Aortic stenosis    Status post St. Jude mechanical AVR 2007  . Asthma   . Atrial fibrillation ([place])   . Carcinoid tumor of colon 2007  . Coronary atherosclerosis of native coronary artery    Status post CABG 2007  . GERD (gastroesophageal reflux disease)   . History of colonoscopy 2003   Dr. Gala Romney - normal  . Hyperlipidemia   . Macromastia   . Pedal edema    Bilateral, chronic  . Peptic stricture of esophagus 10/04/2010   GE junction on last EGD by Dr. Gala Romney, benign biopsies  . RLS (restless legs syndrome)   . Schatzki's ring     Medications:  Medications Prior to Admission  Medication Sig Dispense Refill Last Dose  . albuterol (PROVENTIL HFA;VENTOLIN HFA) 108 (90 BASE) MCG/ACT inhaler Inhale 1-2 puffs into the lungs every 6 (six) hours as needed for wheezing or shortness of breath.    Past Month at Unknown time  . allopurinol (ZYLOPRIM) 100 MG tablet Take 100 mg by mouth daily.   05/01/2018 at Unknown time  . ALPRAZolam (XANAX) 1 MG tablet Take 1 mg by mouth every morning. *May take one tablet two times daily as needed for anxiety*   05/01/2018 at Unknown time  . benzonatate (TESSALON) 200 MG capsule Take 1 capsule (200 mg total) by mouth 2 (two) times daily as needed for cough. (Patient taking differently: Take 200 mg by mouth 2 (two) times daily. *May take one capsule  every 8 hours as needed for cough*) 20 capsule 0 unkonwn  . bisoprolol (ZEBETA) 5 MG tablet Take 1 tablet (5 mg total) by mouth daily. 30 tablet 3 05/01/2018 at 1400  . budesonide-formoterol (SYMBICORT) 160-4.5 MCG/ACT inhaler Inhale 2 puffs into the lungs 2 (two) times daily. 1 Inhaler 3 05/01/2018 at Unknown time  . calcium carbonate (OS-CAL) 600 MG TABS tablet Take 600 mg by mouth 2 (two) times daily.   05/01/2018 at Unknown time  . cetirizine (ZYRTEC) 10 MG tablet Take 10 mg by mouth daily.     05/01/2018 at Unknown time  . clobetasol cream (TEMOVATE) 7.34 % Apply 1 application topically 2 (two) times daily  as needed (FOR SKIN IRRITATION/REDNESS & BLISTERING ON LEGS).    Past Month at Unknown time  . CRESTOR 10 MG tablet Take 10 mg by mouth at bedtime.    05/01/2018 at Unknown time  . cyclobenzaprine (FLEXERIL) 10 MG tablet Take 10 mg by mouth 3 (three) times daily as needed for muscle spasms.    Past Week at Unknown time  . diltiazem (CARDIZEM CD) 120 MG 24 hr capsule Take 1 capsule (120 mg total) by mouth daily. 30 capsule 2 05/01/2018 at Unknown time  . fluconazole (DIFLUCAN) 100 MG tablet Take 100 mg by mouth daily.  0 05/01/2018 at Unknown time  . furosemide (LASIX) 40 MG tablet Take 1 tablet (40 mg total) by mouth 2 (two) times daily. 60 tablet 2 05/01/2018 at Unknown time  . HYDROcodone-acetaminophen (NORCO) 10-325 MG per tablet Take 1 tablet by mouth at bedtime. *May take one tablet every 12 hours as needed for pain*   05/01/2018 at Unknown time  . levothyroxine (SYNTHROID, LEVOTHROID) 50 MCG tablet Take 50 mcg by mouth daily before breakfast.    05/01/2018 at Unknown time  . magnesium oxide (MAG-OX) 400 MG tablet Take 400 mg by mouth at bedtime.   05/01/2018 at Unknown time  . mometasone (NASONEX) 50 MCG/ACT nasal spray Place 2 sprays into the nose at bedtime.  0 05/01/2018 at Unknown time  . Multiple Vitamin (MULTIVITAMIN WITH MINERALS) TABS tablet Take 1 tablet by mouth daily.   05/01/2018 at Unknown time  . omeprazole (PRILOSEC) 20 MG capsule Take 20 mg by mouth daily.   05/01/2018 at Unknown time  . potassium chloride (K-DUR) 10 MEQ tablet Take 1 tablet (10 mEq total) by mouth daily. 90 tablet 3 05/01/2018 at Unknown time  . rOPINIRole (REQUIP) 3 MG tablet Take 3 mg by mouth 2 (two) times daily.    05/01/2018 at Unknown time  . temazepam (RESTORIL) 15 MG capsule Take 15 mg by mouth at bedtime.    05/01/2018 at Unknown time  . warfarin (COUMADIN) 2 MG tablet Takes one tablet (2mg ) everyday (Patient taking differently: Take 3 mg by mouth every evening. ) 45 tablet 3 05/01/2018 at 1400  . azithromycin  (ZITHROMAX) 250 MG tablet Take 1 tablet (250 mg total) by mouth daily. Take first 2 tablets together, then 1 every day until finished. (Patient not taking: Reported on 05/02/2018) 6 tablet 0 Completed Course at Unknown time  . chlorpheniramine-HYDROcodone (TUSSIONEX PENNKINETIC ER) 10-8 MG/5ML SUER Take 5 mLs by mouth every 12 (twelve) hours as needed for cough. (Patient not taking: Reported on 05/02/2018) 120 mL 0 Completed Course at Unknown time  . OXYGEN Inhale 2 L into the lungs daily.    04/27/2018 at Unknown time  . predniSONE (DELTASONE) 10 MG tablet Take 4 tablets by mouth daily for 5  days and stop prednisone. (Patient not taking: Reported on 04/27/2018) 20 tablet 0 Completed Course at Unknown time     [name], Pharm. D. Clinical Pharmacist 05/03/2018 8:35 AM

## 2018-05-03 NOTE — Progress Notes (Signed)
CRITICAL VALUE ALERT  Critical Value:  INR 8.45  Date & Time Notied:  05/03/18 @ 0600  Provider Notified: RN notified Pharmacy  Orders Received/Actions taken: Pt on Coumadin protocol

## 2018-05-03 NOTE — Progress Notes (Signed)
Pt placed on [facility] BIPAP for sleep. BIPAP is plugged into red outlet

## 2018-05-03 NOTE — Progress Notes (Signed)
Patient does have sleep apnea with some extended times while sleeping. She would benefit with a sleep study as her last one is over 67 years old. Her PCO2 most likely run's about 55 to 58 for normal. Settings for BiPAP would be 14/6 2 lpm added to machine.for a home unit. Full mask. Not sure if CPAP will work for her now.

## 2018-05-03 NOTE — Progress Notes (Signed)
PROGRESS NOTE    [name]  HEN:[id number]  DOB: 1951/10/12  DOA: 05/02/2018 PCP: Octavio Graves, DO   Brief Admission Hx: [name] is a 67 y.o. female with medical history significant of diastolic CHF, COPD, atrial fibrillation and chronic resp failure on home oxygen, presents with worsening weakness, lethargy and confusion.  She was admitted with polypharmacy and acute CHF exacerbation.   MDM/Assessment & Plan:    1. Acute on chronic diastolic CHF. Continue intravenous lasix 40mg  bid. Monitor intake and output. EF on Echo done 6/19 with normal EF.  She has diuresed about 1 liter and feeling better today.  2. Metabolic encephalopathy related to hypercapnia. Improved with bipap and holding sedatives.  3. Acute on chronic resp failure with hypercapnia. Continue bipap as needed. Continue on oxygen. 4. CKD stage 3. Stable, Will continue to follow. Possibly related to low output. Continue to monitor with diuresis 5. Chronic atrial fibrillation. Rate controlled. Continue on BB and diltiazem. Anticoagulated with coumadin 6. COPD. She does have some wheezing. Will continue on neb treatments. continue on symbicort.  7. History of mechanical aortic valve. Anticoagulated with coumadin. INR supratherapeutic.  8. Supratherapeutic INR - no active bleeding, Holding warfarin, pharmD consulted to assist with warfarin dosing.  9. HLD. Continue on statin 10. Hypothyroidism. Continue on levothyroxine.  11. Generalized weakness. Awaiting physical therapy evaluation.  12. Anxiety. Will hold xanax for now since she is confused.  13. Chronic pain. Hold norco/flexeril since she is confused.    DVT prophylaxis: warfarin  Code Status: full code  Family Communication: discussed with husband and son  Disposition Plan: discharge home once improved, although may need placement pending physical therapy evaluation  Consults called:   Admission status: inpatient, stepdown   Subjective: Pt is  awake, alert, oriented, says she is feeling better and wants to go home.   Objective: Vitals:   05/03/18 0600 05/03/18 0700 05/03/18 0811 05/03/18 0816  BP: 97/71 112/81    Pulse: 74 82    Resp: 19 20    Temp:  (!) 97.1 F (36.2 C)    TempSrc:  Oral    SpO2: 96% 96% 97% 97%  Weight:      Height:        Intake/Output Summary (Last 24 hours) at 05/03/2018 0837 Last data filed at 05/03/2018 0617 Gross per 24 hour  Intake 1340 ml  Output 2250 ml  Net -910 ml   Filed Weights   05/02/18 0739 05/02/18 1339 05/03/18 0500  Weight: 94.3 kg (207 lb 14.4 oz) 94.4 kg (208 lb 1.8 oz) 94.4 kg (208 lb 1.8 oz)     REVIEW OF SYSTEMS  As per history otherwise all reviewed and reported negative  Exam:  General exam: awake, alert, oriented x 3, NAD. Cooperative.  Respiratory system:  No increased work of breathing. Cardiovascular system: S1 & S2 heard, irregular.  Gastrointestinal system: Abdomen is nondistended, soft and nontender. Normal bowel sounds heard. Central nervous system: Alert and oriented. No focal neurological deficits. Extremities: 1+ edema BLE, dermatitis BLEs.   Data Reviewed: Basic Metabolic Panel: Recent Labs  Lab 04/27/18 1558 05/02/18 0753 05/03/18 0236  NA 141 137 136  K 3.3* 4.3 3.6  CL 98 96* 94*  CO2 35* 34* 33*  GLUCOSE 83 77 77  BUN 21 30* 23  CREATININE 1.37* 1.75* 1.29*  CALCIUM 8.8* 8.7* 8.1*   Liver Function Tests: Recent Labs  Lab 05/02/18 0753  AST 98*  ALT 109*  ALKPHOS 88  BILITOT 1.1  PROT 6.6  ALBUMIN 3.4*   No results for input(s): LIPASE, AMYLASE in the last 168 hours. No results for input(s): AMMONIA in the last 168 hours. CBC: Recent Labs  Lab 04/27/18 1558 05/02/18 0753 05/03/18 0236  WBC 8.5 10.2 6.6  NEUTROABS 5.2  --   --   HGB 10.4* 10.2* 9.6*  HCT 34.8* 34.8* 32.7*  MCV 90.2 91.1 90.6  PLT 202 191 180   Cardiac Enzymes: Recent Labs  Lab 04/27/18 1921 05/02/18 0753 05/02/18 1509 05/02/18 1931  05/03/18 0236  TROPONINI 0.03* 0.06* 0.06* 0.06* 0.06*   CBG (last 3)  No results for input(s): GLUCAP in the last 72 hours. Recent Results (from the past 240 hour(s))  MRSA PCR Screening     Status: None   Collection Time: 05/02/18  1:52 PM  Result Value Ref Range Status   MRSA by PCR NEGATIVE NEGATIVE Final    Comment:        The GeneXpert MRSA Assay (FDA approved for NASAL specimens only), is one component of a comprehensive MRSA colonization surveillance program. It is not intended to diagnose MRSA infection nor to guide or monitor treatment for MRSA infections. Performed at [facility], [address], [place], [place] [address]      Studies: Dg Chest 2 View  Result Date: 05/02/2018 CLINICAL DATA:  CHF, short of breath EXAM: CHEST - 2 VIEW COMPARISON:  04/27/2018 FINDINGS: Progression of vascular congestion and bilateral airspace disease right greater than left. Progression of small bilateral pleural effusions. Prior mitral valve replacement with cardiac enlargement. IMPRESSION: Progressive bilateral airspace disease and small effusions consistent with congestive heart failure and edema. Electronically Signed   By: Franchot Gallo M.D.   On: 05/02/2018 08:53   Scheduled Meds: . allopurinol  100 mg Oral Daily  . bisoprolol  5 mg Oral Daily  . chlorhexidine  15 mL Mouth Rinse BID  . diltiazem  120 mg Oral Daily  . furosemide  40 mg Intravenous BID  . ipratropium-albuterol  3 mL Nebulization Q6H WA  . levothyroxine  50 mcg Oral QAC breakfast  . mouth rinse  15 mL Mouth Rinse q12n4p  . mometasone-formoterol  2 puff Inhalation BID  . pantoprazole  40 mg Oral Daily  . rosuvastatin  10 mg Oral QHS  . sodium chloride flush  3 mL Intravenous Q12H  . Warfarin - Pharmacist Dosing Inpatient   Does not apply q1800   Continuous Infusions: . sodium chloride      Active Problems:   Hyperlipidemia   Atrial fibrillation (HCC)   GERD   Aortic valve replaced   Acute on chronic  diastolic congestive heart failure (HCC)   Acute on chronic respiratory failure with hypercapnia (HCC)   CHF exacerbation (HCC)   Acute metabolic encephalopathy   CKD (chronic kidney disease) stage 3, GFR 30-59 ml/min (HCC)   Hypothyroidism   Generalized weakness   Critical Care Time spent: [place], MD, FAAFP [facility] Pager [phone]  If 7PM-7AM, please contact night-coverage [email or web address] Password [facility] 05/03/2018, 8:37 AM    LOS: 1 day

## 2018-05-03 NOTE — Progress Notes (Signed)
Off BiPAP on to 2lpm /[place]

## 2018-05-04 ENCOUNTER — Inpatient Hospital Stay (HOSPITAL_COMMUNITY): Payer: Medicare HMO

## 2018-05-04 DIAGNOSIS — K219 Gastro-esophageal reflux disease without esophagitis: Secondary | ICD-10-CM

## 2018-05-04 DIAGNOSIS — R791 Abnormal coagulation profile: Secondary | ICD-10-CM

## 2018-05-04 LAB — BASIC METABOLIC PANEL
Anion gap: 8 (ref 5–15)
BUN: 18 mg/dL (ref 8–23)
CHLORIDE: 96 mmol/L — AB (ref 98–111)
CO2: 36 mmol/L — ABNORMAL HIGH (ref 22–32)
Calcium: 8.4 mg/dL — ABNORMAL LOW (ref 8.9–10.3)
Creatinine, Ser: 1.14 mg/dL — ABNORMAL HIGH (ref 0.44–1.00)
GFR, EST AFRICAN AMERICAN: 57 mL/min — AB (ref >60)
GFR, EST NON AFRICAN AMERICAN: 49 mL/min — AB (ref >60)
Glucose, Bld: 91 mg/dL (ref 70–99)
POTASSIUM: 3.5 mmol/L (ref 3.5–5.1)
SODIUM: 140 mmol/L (ref 135–145)

## 2018-05-04 LAB — PROTIME-INR
INR: 5.89 — AB
PROTHROMBIN TIME: 52.3 s — AB (ref 11.4–15.2)

## 2018-05-04 LAB — HIV ANTIBODY (ROUTINE TESTING W REFLEX): HIV Screen 4th Generation wRfx: NONREACTIVE

## 2018-05-04 MED ORDER — FUROSEMIDE 10 MG/ML IJ SOLN
40.0000 mg | Freq: Every day | INTRAMUSCULAR | Status: DC
  Administered 2018-05-04 – 2018-05-05 (×2): 40 mg via INTRAVENOUS
  Filled 2018-05-04 (×2): qty 4

## 2018-05-04 NOTE — Progress Notes (Signed)
ANTICOAGULATION CONSULT NOTE - Follow Up   Pharmacy Consult for:  warfarin dosing  Indication: atrial fibrillation INR Goal range: 2-3 INR today:8.45 Albumin: 3.4 (7-14) AST/ALT: 98/109 (7-14) Home warfarin dose: 3mg  daily, except 2mg  on Sunday and Thursday (19mg  weekly) Last warfarin dose: 05-01-18 Severe Drug Interactions: pt was taking fluconazole and azithromycin prior to admission Concurrent anti-platelet meds: n/a Factors affecting warfarin sensitivity: CHF, renal insufficiency, LFT elevation   Patient Measurements: Height: 5\' 8"  (172.7 cm) Weight: 205 lb 4 oz (93.1 kg) IBW/kg (Calculated) : 63.9 Heparin Dosing Weight: HEPARIN DW (KG): 84.2  Vital Signs: Temp: 98 F (36.7 C) (07/16 0719) Temp Source: Oral (07/16 0719) BP: 96/63 (07/16 0949) Pulse Rate: 83 (07/16 0900)  Labs: Recent Labs    05/02/18 0753 05/02/18 1509 05/02/18 1931 05/03/18 0236 05/04/18 0507  HGB 10.2*  --   --  9.6*  --   HCT 34.8*  --   --  32.7*  --   PLT 191  --   --  180  --   LABPROT 60.3*  --   --  69.4* 52.3*  INR 7.06*  --   --  8.45* 5.89*  CREATININE 1.75*  --   --  1.29* 1.14*  TROPONINI 0.06* 0.06* 0.06* 0.06*  --    Estimated Creatinine Clearance: 57.9 mL/min (A) (by C-G formula based on SCr of 1.14 mg/dL (H)).  Medical History: Past Medical History:  Diagnosis Date  . Acute on chronic diastolic (congestive) heart failure ([place])   . Anxiety disorder   . Aortic stenosis    Status post St. Jude mechanical AVR 2007  . Asthma   . Atrial fibrillation ([place])   . Carcinoid tumor of colon 2007  . Coronary atherosclerosis of native coronary artery    Status post CABG 2007  . GERD (gastroesophageal reflux disease)   . History of colonoscopy 2003   Dr. Gala Romney - normal  . Hyperlipidemia   . Macromastia   . Pedal edema    Bilateral, chronic  . Peptic stricture of esophagus 10/04/2010   GE junction on last EGD by Dr. Gala Romney, benign biopsies  . RLS (restless legs syndrome)   .  Schatzki's ring     Medications:  Medications Prior to Admission  Medication Sig Dispense Refill Last Dose  . albuterol (PROVENTIL HFA;VENTOLIN HFA) 108 (90 BASE) MCG/ACT inhaler Inhale 1-2 puffs into the lungs every 6 (six) hours as needed for wheezing or shortness of breath.    Past Month at Unknown time  . allopurinol (ZYLOPRIM) 100 MG tablet Take 100 mg by mouth daily.   05/01/2018 at Unknown time  . ALPRAZolam (XANAX) 1 MG tablet Take 1 mg by mouth every morning. *May take one tablet two times daily as needed for anxiety*   05/01/2018 at Unknown time  . benzonatate (TESSALON) 200 MG capsule Take 1 capsule (200 mg total) by mouth 2 (two) times daily as needed for cough. (Patient taking differently: Take 200 mg by mouth 2 (two) times daily. *May take one capsule every 8 hours as needed for cough*) 20 capsule 0 unkonwn  . bisoprolol (ZEBETA) 5 MG tablet Take 1 tablet (5 mg total) by mouth daily. 30 tablet 3 05/01/2018 at 1400  . budesonide-formoterol (SYMBICORT) 160-4.5 MCG/ACT inhaler Inhale 2 puffs into the lungs 2 (two) times daily. 1 Inhaler 3 05/01/2018 at Unknown time  . calcium carbonate (OS-CAL) 600 MG TABS tablet Take 600 mg by mouth 2 (two) times daily.   05/01/2018 at Unknown  time  . cetirizine (ZYRTEC) 10 MG tablet Take 10 mg by mouth daily.     05/01/2018 at Unknown time  . clobetasol cream (TEMOVATE) 3.21 % Apply 1 application topically 2 (two) times daily as needed (FOR SKIN IRRITATION/REDNESS & BLISTERING ON LEGS).    Past Month at Unknown time  . CRESTOR 10 MG tablet Take 10 mg by mouth at bedtime.    05/01/2018 at Unknown time  . cyclobenzaprine (FLEXERIL) 10 MG tablet Take 10 mg by mouth 3 (three) times daily as needed for muscle spasms.    Past Week at Unknown time  . diltiazem (CARDIZEM CD) 120 MG 24 hr capsule Take 1 capsule (120 mg total) by mouth daily. 30 capsule 2 05/01/2018 at Unknown time  . fluconazole (DIFLUCAN) 100 MG tablet Take 100 mg by mouth daily.  0 05/01/2018 at  Unknown time  . furosemide (LASIX) 40 MG tablet Take 1 tablet (40 mg total) by mouth 2 (two) times daily. 60 tablet 2 05/01/2018 at Unknown time  . HYDROcodone-acetaminophen (NORCO) 10-325 MG per tablet Take 1 tablet by mouth at bedtime. *May take one tablet every 12 hours as needed for pain*   05/01/2018 at Unknown time  . levothyroxine (SYNTHROID, LEVOTHROID) 50 MCG tablet Take 50 mcg by mouth daily before breakfast.    05/01/2018 at Unknown time  . magnesium oxide (MAG-OX) 400 MG tablet Take 400 mg by mouth at bedtime.   05/01/2018 at Unknown time  . mometasone (NASONEX) 50 MCG/ACT nasal spray Place 2 sprays into the nose at bedtime.  0 05/01/2018 at Unknown time  . Multiple Vitamin (MULTIVITAMIN WITH MINERALS) TABS tablet Take 1 tablet by mouth daily.   05/01/2018 at Unknown time  . omeprazole (PRILOSEC) 20 MG capsule Take 20 mg by mouth daily.   05/01/2018 at Unknown time  . potassium chloride (K-DUR) 10 MEQ tablet Take 1 tablet (10 mEq total) by mouth daily. 90 tablet 3 05/01/2018 at Unknown time  . rOPINIRole (REQUIP) 3 MG tablet Take 3 mg by mouth 2 (two) times daily.    05/01/2018 at Unknown time  . temazepam (RESTORIL) 15 MG capsule Take 15 mg by mouth at bedtime.    05/01/2018 at Unknown time  . warfarin (COUMADIN) 2 MG tablet Takes one tablet (2mg ) everyday (Patient taking differently: Take 3 mg by mouth every evening. ) 45 tablet 3 05/01/2018 at 1400  . azithromycin (ZITHROMAX) 250 MG tablet Take 1 tablet (250 mg total) by mouth daily. Take first 2 tablets together, then 1 every day until finished. (Patient not taking: Reported on 05/02/2018) 6 tablet 0 Completed Course at Unknown time  . chlorpheniramine-HYDROcodone (TUSSIONEX PENNKINETIC ER) 10-8 MG/5ML SUER Take 5 mLs by mouth every 12 (twelve) hours as needed for cough. (Patient not taking: Reported on 05/02/2018) 120 mL 0 Completed Course at Unknown time  . OXYGEN Inhale 2 L into the lungs daily.    04/27/2018 at Unknown time  . predniSONE  (DELTASONE) 10 MG tablet Take 4 tablets by mouth daily for 5 days and stop prednisone. (Patient not taking: Reported on 04/27/2018) 20 tablet 0 Completed Course at Unknown time   Assessment: This ia 16 yof with atrial fibrillation (S/P St. Jude valve in 2007), admitted for acute CHF exacerbation.  She was taking 19mg  of warfarin weekly as an outpatient. INR supra-therapeutic elevation likely due to drug interactions with use of fluconazole (major interaction) and azithromycin courses  which were finished 7/14.  Liver congestion may also be an issue  for this patient. INR remains supratherpeutic, but trending down  Goal of Therapy:  INR 2-3 Monitor platelets by anticoagulation protocol: Yes   Plan:  Hold warfarin today for supra-therapeutic INR  Daily CBC and INR-PT ordered Monitor for S-S of bleeding.  Isac Sarna, BS Pharm D, [place] Clinical Pharmacist Pager [phone] 05/04/2018 10:47 AM

## 2018-05-04 NOTE — Care Management Note (Addendum)
Case Management Note  Patient Details  Name: [name] MRN: [id number] Date of Birth: July 07, 1951  Subjective/Objective:   CHF/poly pharmacy?. From home with husband. Reports Ind with ADL's. Has continuous oxygen with [facility].  Has PCP- husband drives to appts. Patient drives at times. No current home health, had [place] arranged previous visit. Recommended for [facility] PT and rolling walker. Agreeable. Offered choice.                  Action/Plan: Juliann Pulse of [facility] notified and will obtain orders when available. Potential DC tomorrow.   Expected Discharge Date:   05/05/2018               Expected Discharge Plan:  [place]  In-House Referral:     Discharge planning Services  CM Consult  Post Acute Care Choice:  Home Health Choice offered to:  Patient  DME Arranged:  Walker rolling DME Agency:  [place]:  PT [facility] Agency:  [place]  Status of Service:  Completed, signed off  If discussed at [place] of Stay Meetings, dates discussed:    Additional Comments:  Audree Schrecengost, Chauncey [place], RN 05/04/2018, 11:29 AM

## 2018-05-04 NOTE — Progress Notes (Signed)
PROGRESS NOTE   [name]  QIW:[id number]  DOB: [birth year]  DOA: 05/02/2018 PCP: Octavio Graves, DO  Brief Admission Hx: [name] is a 67 y.o. female with medical history significant of diastolic CHF, COPD, atrial fibrillation and chronic resp failure on home oxygen, presents with worsening weakness, lethargy and confusion.  She was admitted with polypharmacy and acute CHF exacerbation.   MDM/Assessment & Plan:   1. Acute on chronic diastolic CHF.  Continue intravenous lasix 40mg .  Monitor intake and output. EF on Echo done 6/19 with normal EF.  She has diuresed about 3 liter and feeling better today.  2. Metabolic encephalopathy related to hypercapnia. Improved with bipap and holding sedatives.  3. Acute on chronic resp failure with hypercapnia. Continue bipap as needed. Continue on oxygen. 4. CKD stage 3. Stable, Will continue to follow. Possibly related to low output. Continue to monitor with diuresis 5. Chronic atrial fibrillation. Rate controlled. Continue on BB and diltiazem. Anticoagulated with coumadin 6. COPD. She does have some wheezing. Will continue on neb treatments. continue on symbicort.  7. History of mechanical aortic valve. Anticoagulated with coumadin. INR supratherapeutic.  8. Supratherapeutic INR - no active bleeding, Holding warfarin, pharmD consulted to assist with warfarin dosing.  9. HLD. Continue on statin 10. Hypothyroidism. Continue on levothyroxine.  11. Generalized weakness. Awaiting physical therapy evaluation.  12. Anxiety. Will hold xanax for now due to confusion.  13. Chronic pain. Hold norco/flexeril due to confusion.    DVT prophylaxis: warfarin  Code Status: full code  Family Communication: discussed with husband and son  Disposition Plan: discharge home once improved, although may need placement pending physical therapy evaluation  Consults called:   Admission status: inpatient, stepdown   Subjective: Pt is awake, alert,  oriented. Pt says that she is feeling a lot better today.  She hasn't gotten out the bed yet.    Objective: Vitals:   05/04/18 0300 05/04/18 0400 05/04/18 0500 05/04/18 0600  BP: 102/65 107/64 97/67 93/65   Pulse: 86 87 82 76  Resp: 20 17 19 14   Temp:  98.3 F (36.8 C)    TempSrc:  Axillary    SpO2: 95% 95% 95% 97%  Weight:  93.1 kg (205 lb 4 oz)    Height:        Intake/Output Summary (Last 24 hours) at 05/04/2018 0629 Last data filed at 05/04/2018 0600 Gross per 24 hour  Intake 500 ml  Output 2750 ml  Net -2250 ml   Filed Weights   05/02/18 1339 05/03/18 0500 05/04/18 0400  Weight: 94.4 kg (208 lb 1.8 oz) 94.4 kg (208 lb 1.8 oz) 93.1 kg (205 lb 4 oz)   REVIEW OF SYSTEMS  As per history otherwise all reviewed and reported negative  Exam:  General exam: awake, alert, oriented x 3, NAD. Cooperative.  Respiratory system:  No increased work of breathing. Cardiovascular system: S1 & S2 heard, irregular.  Gastrointestinal system: Abdomen is nondistended, soft and nontender. Normal bowel sounds heard. Central nervous system: Alert and oriented. No focal neurological deficits. Extremities: 1+ edema BLE, dermatitis BLEs.   Data Reviewed: Basic Metabolic Panel: Recent Labs  Lab 04/27/18 1558 05/02/18 0753 05/03/18 0236 05/04/18 0507  NA 141 137 136 140  K 3.3* 4.3 3.6 3.5  CL 98 96* 94* 96*  CO2 35* 34* 33* 36*  GLUCOSE 83 77 77 91  BUN 21 30* 23 18  CREATININE 1.37* 1.75* 1.29* 1.14*  CALCIUM 8.8* 8.7* 8.1* 8.4*   Liver  Function Tests: Recent Labs  Lab 05/02/18 0753  AST 98*  ALT 109*  ALKPHOS 88  BILITOT 1.1  PROT 6.6  ALBUMIN 3.4*   No results for input(s): LIPASE, AMYLASE in the last 168 hours. No results for input(s): AMMONIA in the last 168 hours. CBC: Recent Labs  Lab 04/27/18 1558 05/02/18 0753 05/03/18 0236  WBC 8.5 10.2 6.6  NEUTROABS 5.2  --   --   HGB 10.4* 10.2* 9.6*  HCT 34.8* 34.8* 32.7*  MCV 90.2 91.1 90.6  PLT 202 191 180    Cardiac Enzymes: Recent Labs  Lab 04/27/18 1921 05/02/18 0753 05/02/18 1509 05/02/18 1931 05/03/18 0236  TROPONINI 0.03* 0.06* 0.06* 0.06* 0.06*   CBG (last 3)  No results for input(s): GLUCAP in the last 72 hours. Recent Results (from the past 240 hour(s))  MRSA PCR Screening     Status: None   Collection Time: 05/02/18  1:52 PM  Result Value Ref Range Status   MRSA by PCR NEGATIVE NEGATIVE Final    Comment:        The GeneXpert MRSA Assay (FDA approved for NASAL specimens only), is one component of a comprehensive MRSA colonization surveillance program. It is not intended to diagnose MRSA infection nor to guide or monitor treatment for MRSA infections. Performed at [facility], [address] [place], [place] [address]     Studies: Dg Chest 2 View  Result Date: 05/02/2018 CLINICAL DATA:  CHF, short of breath EXAM: CHEST - 2 VIEW COMPARISON:  04/27/2018 FINDINGS: Progression of vascular congestion and bilateral airspace disease right greater than left. Progression of small bilateral pleural effusions. Prior mitral valve replacement with cardiac enlargement. IMPRESSION: Progressive bilateral airspace disease and small effusions consistent with congestive heart failure and edema. Electronically Signed   By: Franchot Gallo M.D.   On: 05/02/2018 08:53   Scheduled Meds: . allopurinol  100 mg Oral Daily  . bisoprolol  5 mg Oral Daily  . chlorhexidine  15 mL Mouth Rinse BID  . diltiazem  120 mg Oral Daily  . furosemide  40 mg Intravenous BID  . ipratropium-albuterol  3 mL Nebulization Q6H WA  . levothyroxine  50 mcg Oral QAC breakfast  . mouth rinse  15 mL Mouth Rinse q12n4p  . mometasone-formoterol  2 puff Inhalation BID  . pantoprazole  40 mg Oral Daily  . rosuvastatin  10 mg Oral QHS  . sodium chloride flush  3 mL Intravenous Q12H  . Warfarin - Pharmacist Dosing Inpatient   Does not apply q1800   Continuous Infusions: . sodium chloride     Active Problems:    Hyperlipidemia   Atrial fibrillation (HCC)   GERD   Aortic valve replaced   Acute on chronic diastolic congestive heart failure (HCC)   Acute on chronic respiratory failure with hypercapnia (HCC)   CHF exacerbation (HCC)   Acute metabolic encephalopathy   CKD (chronic kidney disease) stage 3, GFR 30-59 ml/min (HCC)   Hypothyroidism   Generalized weakness   Supratherapeutic INR  Critical Care Time spent: [place], MD, FAAFP [facility] Pager [phone]  If 7PM-7AM, please contact night-coverage [email or web address] Password [email or web address] 05/04/2018, 6:29 AM    LOS: 2 days

## 2018-05-04 NOTE — Progress Notes (Signed)
Pt placed on [facility] CPAP. CPAP plugged into red outlet with 2L O2

## 2018-05-04 NOTE — Plan of Care (Signed)
  Problem: Acute Rehab PT Goals(only PT should resolve) Goal: Pt Will Go Supine/Side To Sit Outcome: Progressing Flowsheets (Taken 05/04/2018 1200) Pt will go Supine/Side to Sit: with modified independence Goal: Patient Will Transfer Sit To/From Stand Outcome: Progressing Flowsheets (Taken 05/04/2018 1200) Patient will transfer sit to/from stand: with supervision Goal: Pt Will Transfer Bed To Chair/Chair To Bed Outcome: Progressing Flowsheets (Taken 05/04/2018 1200) Pt will Transfer Bed to Chair/Chair to Bed: with supervision Goal: Pt Will Ambulate Outcome: Progressing Flowsheets (Taken 05/04/2018 1200) Pt will Ambulate: 25 feet;with supervision;with rolling walker   12:00 PM, 05/04/18 Lonell Grandchild, MPT Physical Therapist with [facility] [phone] office [phone] mobile phone

## 2018-05-04 NOTE — Evaluation (Signed)
Physical Therapy Evaluation Patient Details Name: [name] MRN: [id number] DOB: 1951-09-07 Today's Date: 05/04/2018   History of Present Illness  [name] is a 67 y.o. female with medical history significant of diastolic CHF, COPD, atrial fibrillation and chronic resp failure on home oxygen, presents with worsening weakness, lethargy and confusoin. She was seen in ED on 7/9 for weakness and work up was unrevealing so she was sent home. Her progressive weakness has gotten worse. Family says she feels like dead weight. She has not had any fever. Husband says she has been coughing for the last 4 weeks. She has not had any chest pain, but notes that she has been swelling more recently. She has been more confused lately and has been sleeping more.    Clinical Impression  Patient very unsteady on feet when attempting to ambulate without AD and required use of RW to ambulate to commode for a BM, afterwards, patient to fatigued to attempt ambulation out of room,  walked back to bedside a tolerated sitting up in chair after therapy - RN notified.  Patient will benefit from continued physical therapy in hospital and recommended venue below to increase strength, balance, endurance for safe ADLs and gait.    Follow Up Recommendations Home health PT;Supervision for mobility/OOB    Equipment Recommendations  Rolling walker with 5" wheels    Recommendations for Other Services       Precautions / Restrictions Precautions Precautions: Fall Restrictions Weight Bearing Restrictions: No      Mobility  Bed Mobility Overal bed mobility: Needs Assistance Bed Mobility: Supine to Sit     Supine to sit: Min guard        Transfers Overall transfer level: Needs assistance Equipment used: Rolling walker (2 wheeled) Transfers: Sit to/from [facility] Sit to Stand: Min guard Stand pivot transfers: Min guard       General transfer comment: slow labored  movement  Ambulation/Gait Ambulation/Gait assistance: Min guard Gait Distance (Feet): 10 Feet Assistive device: Rolling walker (2 wheeled) Gait Pattern/deviations: Decreased step length - right;Decreased step length - left;Decreased stride length Gait velocity: slow   General Gait Details: slow labored slightly unsteady cadence requiring use of RW due to BLE weakness, on 2 LPM O2  Stairs            Wheelchair Mobility    Modified Rankin (Stroke Patients Only)       Balance Overall balance assessment: Needs assistance Sitting-balance support: Feet supported;No upper extremity supported Sitting balance-Leahy Scale: Good     Standing balance support: No upper extremity supported;During functional activity Standing balance-Leahy Scale: Poor Standing balance comment: fair using RW                             Pertinent Vitals/Pain Pain Assessment: No/denies pain    Home Living Family/patient expects to be discharged to:: Private residence Living Arrangements: Spouse/significant other Available Help at Discharge: Family Type of Home: Mobile home Home Access: Stairs to enter Entrance Stairs-Rails: Right;Left;Can reach both Entrance Stairs-Number of Steps: 2 Home Layout: One level Home Equipment: Cane - single point;Shower seat      Prior Function Level of Independence: Independent         Comments: household and short distanced Estate agent Dominance   Dominant Hand: Right    Extremity/Trunk Assessment   Upper Extremity Assessment Upper Extremity Assessment: Generalized weakness    Lower Extremity  Assessment Lower Extremity Assessment: Generalized weakness    Cervical / Trunk Assessment Cervical / Trunk Assessment: Normal  Communication   Communication: No difficulties  Cognition Arousal/Alertness: Awake/alert Behavior During Therapy: WFL for tasks assessed/performed Overall Cognitive Status: Within Functional Limits  for tasks assessed                                        General Comments      Exercises     Assessment/Plan    PT Assessment Patient needs continued PT services  PT Problem List Decreased strength;Decreased activity tolerance;Decreased balance;Decreased mobility       PT Treatment Interventions Gait training;Stair training;Functional mobility training;Therapeutic activities;Patient/family education;Therapeutic exercise    PT Goals (Current goals can be found in the Care Plan section)  Acute Rehab PT Goals Patient Stated Goal: return home with spouse to assist PT Goal Formulation: With patient Time For Goal Achievement: 05/11/18 Potential to Achieve Goals: Good    Frequency Min 3X/week   Barriers to discharge        Co-evaluation               AM-PAC PT "6 Clicks" Daily Activity  Outcome Measure Difficulty turning over in bed (including adjusting bedclothes, sheets and blankets)?: None Difficulty moving from lying on back to sitting on the side of the bed? : A Little Difficulty sitting down on and standing up from a chair with arms (e.g., wheelchair, bedside commode, etc,.)?: A Little Help needed moving to and from a bed to chair (including a wheelchair)?: A Little Help needed walking in hospital room?: A Little Help needed climbing 3-5 steps with a railing? : A Lot 6 Click Score: 18    End of Session Equipment Utilized During Treatment: Oxygen Activity Tolerance: Patient limited by fatigue;Patient tolerated treatment well Patient left: in chair;with call bell/phone within reach Nurse Communication: Mobility status;Other (comment)(RN notified that patient left up in chair) PT Visit Diagnosis: Unsteadiness on feet (R26.81);Other abnormalities of gait and mobility (R26.89);Muscle weakness (generalized) (M62.81)    Time: 4270-6237 PT Time Calculation (min) (ACUTE ONLY): 34 min   Charges:   PT Evaluation $PT Eval Moderate Complexity: 1  Mod PT Treatments $Therapeutic Activity: 23-37 mins   PT G Codes:        11:58 AM, 19-May-2018 Lonell Grandchild, MPT Physical Therapist with [facility] [phone] office [phone] mobile phone

## 2018-05-04 NOTE — Progress Notes (Signed)
Patient taken off of BIPAP this AM. Placed on [place] at 2L.Pt is more alert and oriented this AM. States she feels a lot better.   Margaret Pyle, RN

## 2018-05-05 LAB — BASIC METABOLIC PANEL
ANION GAP: 9 (ref 5–15)
BUN: 15 mg/dL (ref 8–23)
CALCIUM: 8.5 mg/dL — AB (ref 8.9–10.3)
CO2: 34 mmol/L — ABNORMAL HIGH (ref 22–32)
Chloride: 97 mmol/L — ABNORMAL LOW (ref 98–111)
Creatinine, Ser: 1.1 mg/dL — ABNORMAL HIGH (ref 0.44–1.00)
GFR calc Af Amer: 59 mL/min — ABNORMAL LOW (ref >60)
GFR, EST NON AFRICAN AMERICAN: 51 mL/min — AB (ref >60)
GLUCOSE: 96 mg/dL (ref 70–99)
Potassium: 3.3 mmol/L — ABNORMAL LOW (ref 3.5–5.1)
SODIUM: 140 mmol/L (ref 135–145)

## 2018-05-05 LAB — CBC
HCT: 31.8 % — ABNORMAL LOW (ref 36.0–46.0)
Hemoglobin: 9.5 g/dL — ABNORMAL LOW (ref 12.0–15.0)
MCH: 26.4 pg (ref 26.0–34.0)
MCHC: 29.9 g/dL — ABNORMAL LOW (ref 30.0–36.0)
MCV: 88.3 fL (ref 78.0–100.0)
PLATELETS: 194 10*3/uL (ref 150–400)
RBC: 3.6 MIL/uL — ABNORMAL LOW (ref 3.87–5.11)
RDW: 16.7 % — AB (ref 11.5–15.5)
WBC: 7.6 10*3/uL (ref 4.0–10.5)

## 2018-05-05 LAB — PROTIME-INR
INR: 5.65
Prothrombin Time: 50.7 seconds — ABNORMAL HIGH (ref 11.4–15.2)

## 2018-05-05 MED ORDER — ALPRAZOLAM 0.25 MG PO TABS: 0.2500 mg | ORAL_TABLET | Freq: Two times a day (BID) | ORAL | 0 refills | Status: DC | PRN

## 2018-05-05 MED ORDER — POTASSIUM CHLORIDE CRYS ER 10 MEQ PO TBCR
50.0000 meq | EXTENDED_RELEASE_TABLET | Freq: Once | ORAL | Status: DC
Start: 2018-05-05 — End: 2018-05-05

## 2018-05-05 MED ORDER — POTASSIUM CHLORIDE ER 20 MEQ PO TBCR: 20.0000 meq | EXTENDED_RELEASE_TABLET | Freq: Two times a day (BID) | ORAL | 0 refills | Status: DC

## 2018-05-05 MED ORDER — WARFARIN SODIUM 2 MG PO TABS: ORAL_TABLET | ORAL | 3 refills | Status: DC

## 2018-05-05 NOTE — Care Management (Signed)
Pt's RW has been delivered to room. Juliann Pulse, [facility] rep, aware of DC today and was made aware of order for INR to be drawn tomorrow 05/06/18.

## 2018-05-05 NOTE — Discharge Instructions (Signed)
Home Health nurse to check PT/INR on 05/06/18 and to report result to anticoagulation clinic at [facility] or to primary care physician.   Seek medical care or return to ER if symptoms worsen or new problems develop.   Call your doctor for any signs of bleeding like black stools or blood in stool.   Please hold taking coumadin (warfarin) for next 2 days.  Restart warfarin when INR is less than 3.   Follow with Primary MD  Octavio Graves, DO  and other consultants as instructed your Hospitalist MD  Please get a complete blood count and chemistry panel checked by your Primary MD at your next visit, and again as instructed by your Primary MD.  Get Medicines reviewed and adjusted: Please take all your medications with you for your next visit with your Primary MD  Laboratory/radiological data: Please request your Primary MD to go over all hospital tests and procedure/radiological results at the follow up, please ask your Primary MD to get all Hospital records sent to his/her office.  In some cases, they will be blood work, cultures and biopsy results pending at the time of your discharge. Please request that your primary care M.D. follows up on these results.  Also Note the following: If you experience worsening of your admission symptoms, develop shortness of breath, life threatening emergency, suicidal or homicidal thoughts you must seek medical attention immediately by calling 911 or calling your MD immediately  if symptoms less severe.  You must read complete instructions/literature along with all the possible adverse reactions/side effects for all the Medicines you take and that have been prescribed to you. Take any new Medicines after you have completely understood and accpet all the possible adverse reactions/side effects.   Do not drive when taking Pain medications or sleeping medications (Benzodaizepines)  Do not take more than prescribed Pain, Sleep and Anxiety Medications. It is not  advisable to combine anxiety,sleep and pain medications without talking with your primary care practitioner  Special Instructions: If you have smoked or chewed Tobacco  in the last 2 yrs please stop smoking, stop any regular Alcohol  and or any Recreational drug use.  Wear Seat belts while driving.  Please note: You were cared for by a hospitalist during your hospital stay. Once you are discharged, your primary care physician will handle any further medical issues. Please note that NO REFILLS for any discharge medications will be authorized once you are discharged, as it is imperative that you return to your primary care physician (or establish a relationship with a primary care physician if you do not have one) for your post hospital discharge needs so that they can reassess your need for medications and monitor your lab values.    Heart Failure Heart failure means your heart has trouble pumping blood. This makes it hard for your body to work well. Heart failure is usually a long-term (chronic) condition. You must take good care of yourself and follow your doctor's treatment plan. Follow these instructions at home:  Take your heart medicine as told by your doctor. ? Do not stop taking medicine unless your doctor tells you to. ? Do not skip any dose of medicine. ? Refill your medicines before they run out. ? Take other medicines only as told by your doctor or pharmacist.  Stay active if told by your doctor. The elderly and people with severe heart failure should talk with a doctor about physical activity.  Eat heart-healthy foods. Choose foods that are without  trans fat and are low in saturated fat, cholesterol, and salt (sodium). This includes fresh or frozen fruits and vegetables, fish, lean meats, fat-free or low-fat dairy foods, whole grains, and high-fiber foods. Lentils and dried peas and beans (legumes) are also good choices.  Limit salt if told by your doctor.  Cook in a healthy  way. Roast, grill, broil, bake, poach, steam, or stir-fry foods.  Limit fluids as told by your doctor.  Weigh yourself every morning. Do this after you pee (urinate) and before you eat breakfast. Write down your weight to give to your doctor.  Take your blood pressure and write it down if your doctor tells you to.  Ask your doctor how to check your pulse. Check your pulse as told.  Lose weight if told by your doctor.  Stop smoking or chewing tobacco. Do not use gum or patches that help you quit without your doctor's approval.  Schedule and go to doctor visits as told.  Nonpregnant women should have no more than 1 drink a day. Men should have no more than 2 drinks a day. Talk to your doctor about drinking alcohol.  Stop illegal drug use.  Stay current with shots (immunizations).  Manage your health conditions as told by your doctor.  Learn to manage your stress.  Rest when you are tired.  If it is really hot outside: ? Avoid intense activities. ? Use air conditioning or fans, or get in a cooler place. ? Avoid caffeine and alcohol. ? Wear loose-fitting, lightweight, and light-colored clothing.  If it is really cold outside: ? Avoid intense activities. ? Layer your clothing. ? Wear mittens or gloves, a hat, and a scarf when going outside. ? Avoid alcohol.  Learn about heart failure and get support as needed.  Get help to maintain or improve your quality of life and your ability to care for yourself as needed. Contact a doctor if:  You gain weight quickly.  You are more short of breath than usual.  You cannot do your normal activities.  You tire easily.  You cough more than normal, especially with activity.  You have any or more puffiness (swelling) in areas such as your hands, feet, ankles, or belly (abdomen).  You cannot sleep because it is hard to breathe.  You feel like your heart is beating fast (palpitations).  You get dizzy or light-headed when you  stand up. Get help right away if:  You have trouble breathing.  There is a change in mental status, such as becoming less alert or not being able to focus.  You have chest pain or discomfort.  You faint. This information is not intended to replace advice given to you by your health care provider. Make sure you discuss any questions you have with your health care provider. Document Released: 07/15/2008 Document Revised: 03/13/2016 Document Reviewed: 11/22/2012 Elsevier Interactive Patient Education  2017 [place].   Heart Failure Exacerbation  Heart failure is a condition in which the heart does not fill up with enough blood, and therefore does not pump enough blood and oxygen to the body. When this happens, parts of the body do not get the blood and oxygen they need to function properly. This can cause symptoms such as breathing problems, fatigue, swelling, and confusion. Heart failure exacerbation refers to heart failure symptoms that get worse. The symptoms may get worse suddenly or develop slowly over time. Heart failure exacerbation is a serious medical problem that should be treated right away. What  are the causes? A heart failure exacerbation can be triggered by:  Not taking your heart failure medicines correctly.  Infections.  Eating an unhealthy diet or a diet that is high in salt (sodium).  Drinking too much fluid.  Drinking alcohol.  Taking illegal drugs, such as cocaine or methamphetamine.  Not exercising.  Other causes include:  Other heart conditions such as an irregular heartbeat (arrhythmia).  Anemia.  Other medical problems, such as kidney failure.  Sometimes the cause of the exacerbation is not known. What are the signs or symptoms? When heart failure symptoms suddenly or slowly get worse, this may be a sign of heart failure exacerbation. Symptoms of heart failure include:  Breathing problems or shortness of breath.  Chronic coughing or  wheezing.  Fatigue.  Nausea or lack of appetite.  Feeling light-headed.  Confusion or memory loss.  Increased heart rate or irregular heartbeat.  Buildup of fluid in the legs, ankles, feet, or abdomen.  Difficulty breathing when lying down.  How is this diagnosed? This condition is diagnosed based on:  Your symptoms and medical history.  A physical exam.  You may also have tests, including:  Electrocardiogram (ECG). This test measures the electrical activity of your heart.  Echocardiogram. This test uses sound waves to take a picture of your heart to see how well it works.  Blood tests.  Imaging tests, such as: ? Chest X-ray. ? MRI. ? Ultrasound.  Stress test. This test examines how well your heart functions when you exercise. Your heart is monitored while you exercise on a treadmill or exercise bike. If you cannot exercise, medicines may be used to increase your heartbeat in place of exercise.  Cardiac catheterization. During this test, a thin, flexible tube (catheter) is inserted into a blood vessel and threaded up to your heart. This test allows your health care provider to check the arteries that lead to your heart (coronary arteries).  Right heart catheterization. During this test, the pressure in your heart is measured.  How is this treated? This condition may be treated by:  Adjusting your heart medicines.  Maintaining a healthy lifestyle. This includes: ? Eating a heart-healthy diet that is low in sodium. ? Not using any products that contain nicotine or tobacco, such as cigarettes and e-cigarettes. ? Regular exercise. ? Monitoring your fluid intake. ? Monitoring your weight and reporting changes to your health care provider.  Treating sleep apnea, if you have this condition.  Surgery. This may include: ? Implanting a device that helps both sides of your heart contract at the same time (cardiac resynchronization therapy device). This can help with  heart function and relieve heart failure symptoms. ? Implanting a device that can correct heart rhythm problems (implantable cardioverter defibrillator). ? Connecting a device to your heart to help it pump blood (ventricular assist device). ? Heart transplant.  Follow these instructions at home: Medicines  Take over-the-counter and prescription medicines only as told by your health care provider.  Do not stop taking your medicines or change the amount you take. If you are having problems or side effects from your medicines, talk to your health care provider.  If you are having difficulty paying for your medicines, contact a social worker or your clinic. There are many programs to assist with medicine costs.  Talk to your health care provider before starting any new medicines or supplements.  Make sure your health care provider and pharmacist have a list of all the medicines you are taking. Eating  and drinking  Avoid drinking alcohol.  Eat a heart-healthy diet as told by your health care provider. This includes: ? Plenty of fruits and vegetables. ? Lean proteins. ? Low-fat dairy. ? Whole grains. ? Foods that are low in sodium. Activity  Exercise regularly as told by your health care provider. Balance exercise with rest.  Ask your health care provider what activities are safe for you. This includes sexual activity, exercise, and daily tasks at home or work. Lifestyle  Do not use any products that contain nicotine or tobacco, such as cigarettes and e-cigarettes. If you need help quitting, ask your health care provider.  Maintain a healthy weight. Ask your health care provider what weight is healthy for you.  Consider joining a patient support group. This can help with emotional problems you may have, such as stress and anxiety. General instructions  Talk to your health care provider about flu and pneumonia vaccines.  Keep a list of medicines that you are taking. This may help  in emergency situations.  Keep all follow-up visits as told by your health care provider. This is important. Contact a health care provider if:  You have questions about your medicines or you miss a dose.  You feel anxious, depressed, or stressed.  You have swelling in your feet, ankles, legs, or abdomen.  You have shortness of breath during activity or exercise.  You have a cough.  You have a fever.  You have trouble sleeping.  You gain 2-3 lb (1-1.4 kg) in 24 hours or 5 lb (2.3 kg) in a week. Get help right away if:  You have chest pain.  You have shortness of breath while resting.  You have severe fatigue.  You are confused.  You have severe dizziness.  You have a rapid or irregular heartbeat.  You have nausea or you vomit.  You have a cough that is worse at night or you cannot lie flat.  You have a cough that will not go away.  You have severe depression or sadness. Summary  When heart failure symptoms get worse, it is called heart failure exacerbation.  Common causes of this condition include taking medicines incorrectly, infections, and drinking alcohol.  This condition may be treated by adjusting medicines, maintaining a healthy lifestyle, or surgery.  Do not stop taking your medicines or change the amount you take. If you are having problems or side effects from your medicines, talk to your health care provider. This information is not intended to replace advice given to you by your health care provider. Make sure you discuss any questions you have with your health care provider. Document Released: 02/17/2017 Document Revised: 02/17/2017 Document Reviewed: 02/17/2017 Elsevier Interactive Patient Education  2018 [place].   Heart Failure Action Plan A heart failure action plan helps you understand what to do when you have symptoms of heart failure. Follow the plan that was created by you and your health care provider. Review your plan each time you  visit your health care provider. Red zone These signs and symptoms mean you should get medical help right away:  You have trouble breathing when resting.  You have a dry cough that is getting worse.  You have swelling or pain in your legs or abdomen that is getting worse.  You suddenly gain more than 2-3 lb (0.9-1.4 kg) in a day, or more than 5 lb (2.3 kg) in one week. This amount may be more or less depending on your condition.  You have  trouble staying awake or you feel confused.  You have chest pain.  You do not have an appetite.  You pass out.  If you experience any of these symptoms:  Call your local emergency services (911 in the [place] right away or seek help at the emergency department of the nearest hospital.  Yellow zone These signs and symptoms mean your condition may be getting worse and you should make some changes:  You have trouble breathing when you are active or you need to sleep with extra pillows.  You have swelling in your legs or abdomen.  You gain 2-3 lb (0.9-1.4 kg) in one day, or 5 lb (2.3 kg) in one week. This amount may be more or less depending on your condition.  You get tired easily.  You have trouble sleeping.  You have a dry cough.  If you experience any of these symptoms:  Contact your health care provider within the next day.  Your health care provider may adjust your medicines.  Green zone These signs mean you are doing well and can continue what you are doing:  You do not have shortness of breath.  You have very little swelling or no new swelling.  Your weight is stable (no gain or loss).  You have a normal activity level.  You do not have chest pain or any other new symptoms.  Follow these instructions at home:  Take over-the-counter and prescription medicines only as told by your health care provider.  Weigh yourself daily. Your target weight is __________ lb (__________ kg). ? Call your health care provider if you  gain more than __________ lb (__________ kg) in a day, or more than __________ lb (__________ kg) in one week.  Eat a heart-healthy diet. Work with a diet and nutrition specialist (dietitian) to create an eating plan that is best for you.  Keep all follow-up visits as told by your health care provider. This is important. Where to find more information:  American Heart Association: www.heart.org Summary  Follow the action plan that was created by you and your health care provider.  Get help right away if you have any symptoms in the Red zone. This information is not intended to replace advice given to you by your health care provider. Make sure you discuss any questions you have with your health care provider. Document Released: 11/15/2016 Document Revised: 11/15/2016 Document Reviewed: 11/15/2016 Elsevier Interactive Patient Education  2018 [place].   Heart Failure Eating Plan Heart failure, also called congestive heart failure, occurs when your heart does not pump blood well enough to meet your body's needs for oxygen-rich blood. Heart failure is a long-term (chronic) condition. Living with heart failure can be challenging. However, following your health care provider's instructions about a healthy lifestyle and working with a diet and nutrition specialist (dietitian) to choose the right foods may help to improve your symptoms. What are tips for following this plan? General guidelines  Do not eat more than 2,300 mg of salt (sodium) a day. The amount of sodium that is recommended for you may be lower, depending on your condition.  Maintain a healthy body weight as directed. Ask your health care provider what a healthy weight is for you. ? Check your weight every day. ? Work with your health care provider and dietitian to make a plan that is right for you to lose weight or maintain your current weight.  Limit how much fluid you drink. Ask your health care provider or dietitian  how  much fluid you can have each day.  Limit or avoid alcohol as told by your health care provider or dietitian. Reading food labels  Check food labels for the amount of sodium per serving. Choose foods that have less than 140 mg (milligrams) of sodium in each serving.  Check food labels for the number of calories per serving. This is important if you need to limit your daily calorie intake to lose weight.  Check food labels for the serving size. If you eat more than one serving, you will be eating more sodium and calories than what is listed on the label.  Look for foods that are labeled as "sodium-free," "very low sodium," or "low sodium." ? Foods labeled as "reduced sodium" or "lightly salted" may still have more sodium than what is recommended for you. Cooking  Avoid adding salt when cooking. Ask your health care provider or dietitian before using salt substitutes.  Season food with salt-free seasonings, spices, or herbs. Check the label of seasoning mixes to make sure they do not contain salt.  Cook with heart-healthy oils, such as olive, canola, soybean, or sunflower oil.  Do not fry foods. Cook foods using low-fat methods, such as baking, boiling, grilling, and broiling.  Limit unhealthy fats when cooking by: ? Removing the skin from poultry, such as chicken. ? Removing all visible fats from meats. ? Skimming the fat off from stews, soups, and gravies before serving them. Meal planning  Limit your intake of: ? Processed, canned, or pre-packaged foods. ? Foods that are high in trans fat, such as fried foods. ? Sweets, desserts, sugary drinks, and other foods with added sugar. ? Full-fat dairy products, such as whole milk.  Eat a balanced diet that includes: ? 4-5 servings of fruit each day and 4-5 servings of vegetables each day. At each meal, try to fill half of your plate with fruits and vegetables. ? Up to 6-8 servings of whole grains each day. ? Up to 2 servings of lean  meat, poultry, or fish each day. One serving of meat is equal to 3 oz. This is about the same size as a deck of cards. ? 2 servings of low-fat dairy each day. ? Heart-healthy fats. Healthy fats called omega-3 fatty acids are found in foods such as flaxseed and cold-water fish like sardines, salmon, and mackerel.  Aim to eat 25-35 g (grams) of fiber a day. Foods that are high in fiber include apples, broccoli, carrots, beans, peas, and whole grains.  Do not add salt or condiments that contain salt (such as soy sauce) to foods before eating.  When eating at a restaurant, ask that your food be prepared with less salt or no salt, if possible.  Try to eat 2 or more vegetarian meals each week.  Eat more home-cooked food and eat less restaurant, buffet, and fast food. Recommended foods The items listed may not be a complete list. Talk with your dietitian about what dietary choices are best for you. Grains Bread with less than 80 mg of sodium per slice. Whole-wheat pasta, quinoa, and brown rice. Oats and oatmeal. Barley. [place]. Grits and cream of wheat. Whole-grain and whole-wheat cold cereal. Vegetables All fresh vegetables. Vegetables that are frozen without sauce or added salt. Low-sodium or sodium-free canned vegetables. Fruits All fresh, frozen, and canned fruits. Dried fruits, such as raisins, prunes, and cranberries. Meats and other protein foods Lean cuts of meat. Skinless chicken and [place]. Fish with high omega-3 fatty acids, such  as salmon, sardines, and other cold-water fishes. Eggs. Dried beans, peas, and edamame. Unsalted nuts and nut butters. Dairy Low-fat or nonfat (skim) milk and dried milk. Rice milk, soy milk, and almond milk. Low-fat or nonfat yogurt. Small amounts of reduced-sodium block cheese. Low-sodium cottage cheese. Fats and oils Olive, canola, soybean, flaxseed, or sunflower oil. Avocado. Sweets and desserts Apple sauce. Granola bars. Sugar-free pudding and gelatin.  Frozen fruit bars. Seasoning and other foods Fresh and dried herbs. Lemon or lime juice. Vinegar. Low-sodium ketchup. Salt-free marinades, salad dressings, sauces, and seasonings. Foods to avoid The items listed may not be a complete list. Talk with your dietitian about what dietary choices are best for you. Grains Bread with more than 80 mg of sodium per slice. Hot or cold cereal with more than 140 mg sodium per serving. Salted pretzels and crackers. Pre-packaged breadcrumbs. Bagels, croissants, and biscuits. Vegetables Canned vegetables. Frozen vegetables with sauce or seasonings. Creamed vegetables. [place] fries. Onion rings. Pickled vegetables and sauerkraut. Fruits Fruits that are dried with sodium-containing preservatives. Meats and other protein foods Ribs and chicken wings. Bacon, ham, pepperoni, bologna, salami, and packaged luncheon meats. Hot dogs, bratwurst, and sausage. Canned meat. Smoked meat and fish. Salted nuts and seeds. Dairy Whole milk, half-and-half, and cream. Buttermilk. Processed cheese, cheese spreads, and cheese curds. Regular cottage cheese. Feta cheese. Shredded cheese. String cheese. Fats and oils Butter, lard, shortening, ghee, and bacon fat. Canned and packaged gravies. Seasoning and other foods Onion salt, garlic salt, table salt, and sea salt. Marinades. Regular salad dressings. Relishes, pickles, and olives. Meat flavorings and tenderizers, and bouillon cubes. Horseradish, ketchup, and mustard. Worcestershire sauce. Teriyaki sauce, soy sauce (including reduced sodium). Hot sauce and Tabasco sauce. Steak sauce, fish sauce, oyster sauce, and cocktail sauce. Taco seasonings. Barbecue sauce. T[place]ar sauce. Summary  A heart failure eating plan includes changes that limit your intake of sodium and unhealthy fat, and it may help you lose weight or maintain a healthy weight. Your health care provider may also recommend limiting how much fluid you drink.  Most people  with heart failure should eat no more than 2,300 mg of salt (sodium) a day. The amount of sodium that is recommended for you may be lower, depending on your condition.  Contact your health care provider or dietitian before making any major changes to your diet. This information is not intended to replace advice given to you by your health care provider. Make sure you discuss any questions you have with your health care provider. Document Released: 02/20/2017 Document Revised: 02/20/2017 Document Reviewed: 02/20/2017 Elsevier Interactive Patient Education  2018 [place] With Heart Failure  Heart failure is a long-term (chronic) condition in which the heart cannot pump enough blood through the body. When this happens, parts of the body do not get the blood and oxygen they need. There is no cure for heart failure at this time, so it is important for you to take good care of yourself and follow the treatment plan set by your health care provider. If you are living with heart failure, there are ways to help you manage the disease. Follow these instructions at home: Living with heart failure requires you to make changes in your life. Your health care team will teach you about the changes you need to make in order to relieve your symptoms and lower your risk of going to the hospital. Follow the treatment plan as set by your health care provider. Medicines Medicines are  important in reducing your heart's workload, slowing the progression of heart failure, and improving your symptoms.  Take over-the-counter and prescription medicines only as told by your health care provider.  Do not stop taking your medicine unless your health care provider tells you to do that.  Do not skip any dose of your medicine.  Refill prescriptions before you run out of medicine. You need your medicines every day.  Eating and drinking  Eat heart-healthy foods. Talk with a dietitian to make an eating plan that  is right for you. ? If directed by your health care provider: ? Limit salt (sodium). Lowering your sodium intake may reduce symptoms of heart failure. Ask a dietitian to recommend heart-healthy seasonings. ? Limit your fluid intake. Fluid restriction may reduce symptoms of heart failure. ? Use low-fat cooking methods instead of frying. Low-fat methods include roasting, grilling, broiling, baking, poaching, steaming, and stir-frying. ? Choose foods that contain no trans fat and are low in saturated fat and cholesterol. Healthy choices include fresh or frozen fruits and vegetables, fish, lean meats, legumes, fat-free or low-fat dairy products, and whole-grain or high-fiber foods.  Limit alcohol intake to no more than 1 drink a day for nonpregnant women and 2 drinks a day for men. One drink equals 12 oz of beer, 5 oz of wine, or 1 oz of hard liquor. ? Drinking more than that is harmful to your heart. Tell your health care provider if you drink alcohol several times a week. ? Talk with your health care provider about whether any level of alcohol use is safe for you. Activity  Ask your health care provider about attending cardiac rehabilitation. These programs include aerobic physical activity, which provides many benefits for your heart.  If no cardiac rehabilitation program is available, ask your health care provider what aerobic exercises are safe for you to do. Lifestyle Make the lifestyle changes recommended by your health care provider. In general:  Lose weight if your health care provider tells you to do that. Weight loss may reduce symptoms of heart failure.  Do not use any products that contain nicotine or tobacco, such as cigarettes or e-cigarettes. If you need help quitting, ask your health care provider.  Do not use street (illegal) drugs.  Return to your normal activities as told by your health care provider. Ask your health care provider what activities are safe for you.  General  instructions  Make sure you weigh yourself every day to track your weight. Rapid weight gain may indicate an increase in fluid in your body and may increase the workload of your heart. ? Weigh yourself every morning. Do this after you urinate but before you eat breakfast. ? Wear the same type of clothing, without shoes, each time you weigh yourself. ? Weigh yourself on the same scale and in the same spot each time.  Living with chronic heart failure often leads to emotions such as fear, stress, anxiety, and depression. If you feel any of these emotions and need help coping, contact your health care provider. Other ways to get help include: ? Talking to friends and family members about your condition. They can give you support and guidance. Explain your symptoms to them and, if comfortable, invite them to attend appointments or rehabilitation with you. ? Joining a support group for people with chronic heart failure. Talking with other people who have the same symptoms may give you new ways of coping with your disease and your emotions.  Stay up to  date with your shots (vaccines). Staying current on pneumococcal and influenza vaccines is especially important in preventing germs from attacking your airways (respiratory infections).  Keep all follow-up visits as told by your health care provider. This is important. How to recognize changes in your condition You and your family members need to know what changes to watch for in your condition. Watch for the following changes and report them to your health care provider:  Sudden weight gain. Ask your health care provider what amount of weight gain to report.  Shortness of breath: ? Feeling short of breath while at rest, with no exercise or activity that required great effort. ? Feeling breathless with activity.  Swelling of your lower legs or ankles.  Difficulty sleeping: ? You wake up feeling short of breath. ? You have to use more pillows to  raise your head in order to sleep.  Frequent, dry, hacking cough.  Loss of appetite.  Feeling more tired all the time.  Depression or feelings of sadness or hopelessness.  Bloating in the stomach.  Where to find more information  Local support groups. Ask your health care provider about groups near you.  The American Heart Association: www.heart.org Contact a health care provider if:  You have a rapid weight gain.  You have increasing shortness of breath that is unusual for you.  You are unable to participate in your usual physical activities.  You tire easily.  You cough more than normal, especially with physical activity.  You have any swelling or more swelling in areas such as your hands, feet, ankles, or abdomen.  You feel like your heart is beating quickly (palpitations).  You become dizzy or light-headed when you stand up. Get help right away if:  You have difficulty breathing.  You notice or your family notices a change in your awareness, such as having trouble staying awake or having difficulty with concentration.  You have pain or discomfort in your chest.  You have an episode of fainting (syncope). Summary  There is no cure for heart failure, so it is important for you to take good care of yourself and follow the treatment plan set by your health care provider.  Medicines are important in reducing your heart's workload, slowing the progression of heart failure, and improving your symptoms.  Living with chronic heart failure often leads to emotions such as fear, stress, anxiety, and depression. If you are feeling any of these emotions and need help coping, contact your health care provider. This information is not intended to replace advice given to you by your health care provider. Make sure you discuss any questions you have with your health care provider. Document Released: 02/18/2017 Document Revised: 02/18/2017 Document Reviewed: 02/18/2017 Elsevier  Interactive Patient Education  2018 [employer].

## 2018-05-05 NOTE — Progress Notes (Signed)
Physical Therapy Treatment Patient Details Name: [name] MRN: [id number] DOB: 13-Sep-1951 Today's Date: 05/05/2018    History of Present Illness [name] is a 67 y.o. female with medical history significant of diastolic CHF, COPD, atrial fibrillation and chronic resp failure on home oxygen, presents with worsening weakness, lethargy and confusoin. She was seen in ED on 7/9 for weakness and work up was unrevealing so she was sent home. Her progressive weakness has gotten worse. Family says she feels like dead weight. She has not had any fever. Husband says she has been coughing for the last 4 weeks. She has not had any chest pain, but notes that she has been swelling more recently. She has been more confused lately and has been sleeping more.    PT Comments    Patient requires less assistance for sitting up, sit to stands, and transferring to commode in bathroom.  Patient safely supervised by her spouse when ambulating to bathroom without loss of balance and patient tolerated sitting up in chair with legs elevated after therapy.  Patient will benefit from continued physical therapy in hospital and recommended venue below to increase strength, balance, endurance for safe ADLs and gait.    Follow Up Recommendations  Home health PT;Supervision for mobility/OOB     Equipment Recommendations  Rolling walker with 5" wheels    Recommendations for Other Services       Precautions / Restrictions Precautions Precautions: Fall Restrictions Weight Bearing Restrictions: No    Mobility  Bed Mobility Overal bed mobility: Needs Assistance Bed Mobility: Supine to Sit     Supine to sit: Supervision     General bed mobility comments: slow labored movement with use of bed rail  Transfers Overall transfer level: Needs assistance Equipment used: Rolling walker (2 wheeled) Transfers: Sit to/from [employer] Transfers Sit to Stand: Supervision Stand pivot transfers:  Supervision       General transfer comment: requires less assistance  Ambulation/Gait Ambulation/Gait assistance: Supervision Gait Distance (Feet): 20 Feet Assistive device: Rolling walker (2 wheeled) Gait Pattern/deviations: Decreased step length - right;Decreased step length - left;Decreased stride length Gait velocity: slow   General Gait Details: slightly labored slow cadence without loss of balance, on 2 LPM O2, limited secondary to fatigue   Stairs             Wheelchair Mobility    Modified Rankin (Stroke Patients Only)       Balance Overall balance assessment: Needs assistance Sitting-balance support: Feet supported;No upper extremity supported Sitting balance-Leahy Scale: Good     Standing balance support: During functional activity;No upper extremity supported Standing balance-Leahy Scale: Fair Standing balance comment: fair/good using RW                            Cognition Arousal/Alertness: Awake/alert Behavior During Therapy: WFL for tasks assessed/performed Overall Cognitive Status: Within Functional Limits for tasks assessed                                        Exercises General Exercises - Lower Extremity Long Arc Quad: Seated;AROM;Strengthening;Both;10 reps Hip Flexion/Marching: Seated;AROM;Strengthening;Both;10 reps Toe Raises: Seated;AROM;Strengthening;Both;10 reps Heel Raises: Seated;AROM;Strengthening;Both;10 reps    General Comments        Pertinent Vitals/Pain Pain Assessment: No/denies pain    Home Living  Prior Function            PT Goals (current goals can now be found in the care plan section) Acute Rehab PT Goals Patient Stated Goal: return home with spouse to assist PT Goal Formulation: With patient Time For Goal Achievement: 05/11/18 Potential to Achieve Goals: Good Progress towards PT goals: Progressing toward goals    Frequency    Min  3X/week      PT Plan Current plan remains appropriate    Co-evaluation              AM-PAC PT "6 Clicks" Daily Activity  Outcome Measure  Difficulty turning over in bed (including adjusting bedclothes, sheets and blankets)?: None Difficulty moving from lying on back to sitting on the side of the bed? : None Difficulty sitting down on and standing up from a chair with arms (e.g., wheelchair, bedside commode, etc,.)?: None Help needed moving to and from a bed to chair (including a wheelchair)?: None Help needed walking in hospital room?: A Little Help needed climbing 3-5 steps with a railing? : A Little 6 Click Score: 22    End of Session Equipment Utilized During Treatment: Oxygen Activity Tolerance: Patient tolerated treatment well;Patient limited by fatigue Patient left: in chair;with call bell/phone within reach;with family/visitor present Nurse Communication: Mobility status PT Visit Diagnosis: Unsteadiness on feet (R26.81);Other abnormalities of gait and mobility (R26.89);Muscle weakness (generalized) (M62.81)     Time: 0962-8366 PT Time Calculation (min) (ACUTE ONLY): 24 min  Charges:  $Therapeutic Activity: 23-37 mins                    G Codes:       11:54 AM, 05-30-2018 Lonell Grandchild, MPT Physical Therapist with [facility] [phone] office [phone] mobile phone

## 2018-05-05 NOTE — Discharge Summary (Signed)
Physician Discharge Summary  [name] ZDG:[id number] DOB: 1951/05/30 DOA: 05/02/2018  PCP: Octavio Graves, DO  Admit date: 05/02/2018 Discharge date: 05/05/2018  Admitted From: Home Disposition: Home with home health Recommendations for Outpatient Follow-up:  1. Follow up with PCP in 1 weeks 2. Please obtain BMP/CBC in one week 3. Please follow up on the following pending results: Home Health nurse to check PT/INR on 05/06/18 and to report result to anticoagulation clinic at [facility] or to primary care physician.   Seek medical care or return to ER if symptoms worsen or new problems develop.   Call your doctor for any signs of bleeding like black stools or blood in stool.   Please hold taking coumadin (warfarin) for next 2 days.  Restart warfarin when INR is less than 3.  Home Health: Physical therapy and nursing  Discharge Condition: STABLE  CODE STATUS: FULL    Brief Hospitalization Summary: Please see all hospital notes, images, labs for full details of the hospitalization.  HPI: [name] is a 67 y.o. female with medical history significant of diastolic CHF, COPD, atrial fibrillation and chronic resp failure on home oxygen, presents with worsening weakness, lethargy and confusoin. She was seen in ED on 7/9 for weakness and work up was unrevealing so she was sent home. Her progressive weakness has gotten worse. Family says she feels like dead weight. She has not had any fever. Husband says she has been coughing for the last 4 weeks. She has not had any chest pain, but notes that she has been swelling more recently. She has been more confused lately and has been sleeping more.  ED Course: chest xray indicated decompensated CHF, BNP mildly elevated. EKG did not show any acute changes. ABG shows mild increase in pCO2. She has been referred for admission  Brief Admission Hx: [name] a 67 y.o.femalewith medical history significant ofdiastolic CHF, COPD,  atrial fibrillation and chronic resp failure on home oxygen, presents with worsening weakness, lethargy and confusion.  She was admitted with polypharmacy and acute CHF exacerbation.   MDM/Assessment & Plan:   1. Acute on chronic diastolic CHF.  The patient was treated with intravenous lasix and diuresed well.  Monitor intake and output. EF on Echo done 6/19 with normal EF.  She has diuresed about 3.5 liters and feeling better.  Her repeat chest xray showed resolution of pulmonary edema.   2. Metabolic encephalopathy related to hypercapnia.  Improved with bipap and holding sedatives. She was encouraged to use her CPAP every night.  We used CPAP in hospital and she tolerated it well.   3. Acute on chronic resp failure with hypercapnia. Resolved now.  Continue on home oxygen. 4. CKD stage 3. Stable, Improved some with diuresis.  5. Chronic atrial fibrillation. Rate controlled. Continue on BB and diltiazem. Anticoagulated with coumadin.  6. COPD. Stable now.  Will continue on neb treatments. continue on symbicort.  7. History of mechanical aortic valve. Anticoagulated with coumadin. INR supratherapeutic.  8. Supratherapeutic INR - no active bleeding, Holding warfarin for next 2 days.  Will have [place] check PT/INR on 7/18 and report to anticoagulation clinic and PCP.  Resume warfarin when INR less than 3.  9. HLD. Continue on statin 10. Hypothyroidism. Continue on levothyroxine.  11. Generalized weakness. Awaiting physical therapy evaluation.  12. Anxiety. Her Xanax dose was markedly reduced to 0.25 mg BID prn to help avoid these episodes of encephalopathy and confusion.  13. Chronic pain. Discontinued  norco/flexeril due to repeated episodes of encephalopathy and confusion.    DVT prophylaxis:warfarin Code Status:full code Family Communication:discussed with husband and son Disposition Plan:discharge home with HHPT/rolling walker Consults called: Admission status:inpatient,  stepdown  Discharge Diagnoses:  Active Problems:   Hyperlipidemia   Atrial fibrillation (HCC)   GERD   Aortic valve replaced   Acute on chronic diastolic congestive heart failure (HCC)   Acute on chronic respiratory failure with hypercapnia (HCC)   CHF exacerbation (HCC)   Acute metabolic encephalopathy   CKD (chronic kidney disease) stage 3, GFR 30-59 ml/min (HCC)   Hypothyroidism   Generalized weakness   Supratherapeutic INR  Discharge Instructions: Discharge Instructions    (HEART FAILURE PATIENTS) Call MD:  Anytime you have any of the following symptoms: 1) 3 pound weight gain in 24 hours or 5 pounds in 1 week 2) shortness of breath, with or without a dry hacking cough 3) swelling in the hands, feet or stomach 4) if you have to sleep on extra pillows at night in order to breathe.   Complete by:  As directed    Call MD for:  difficulty breathing, headache or visual disturbances   Complete by:  As directed    Call MD for:  extreme fatigue   Complete by:  As directed    Call MD for:  persistant dizziness or light-headedness   Complete by:  As directed    Call MD for:  persistant nausea and vomiting   Complete by:  As directed    Diet - low sodium heart healthy   Complete by:  As directed    Discharge instructions   Complete by:  As directed    Home Health nurse to check PT/INR on 05/06/18 and to report result to anticoagulation clinic at [facility] or to primary care physician.   Seek medical care or return to ER if symptoms worsen or new problems develop.   Call your doctor for any signs of bleeding like black stools or blood in stool.   Please hold taking coumadin (warfarin) for next 2 days.  Restart warfarin when INR is less than 3.   Increase activity slowly   Complete by:  As directed      Allergies as of 05/05/2018      Reactions   Penicillins Hives, Itching, Swelling, Rash   Has patient had a PCN reaction causing immediate rash, facial/tongue/throat swelling,  SOB or lightheadedness with hypotension: Yes Has patient had a PCN reaction causing severe rash involving mucus membranes or skin necrosis: Yes Has patient had a PCN reaction that required hospitalization: unknown Has patient had a PCN reaction occurring within the last 10 years: No If all of the above answers are "NO", then may proceed with Cephalosporin use. Throat swelling      Medication List    STOP taking these medications   azithromycin 250 MG tablet Commonly known as:  ZITHROMAX   benzonatate 200 MG capsule Commonly known as:  TESSALON   chlorpheniramine-HYDROcodone 10-8 MG/5ML Suer Commonly known as:  TUSSIONEX PENNKINETIC ER   cyclobenzaprine 10 MG tablet Commonly known as:  FLEXERIL   fluconazole 100 MG tablet Commonly known as:  DIFLUCAN   HYDROcodone-acetaminophen 10-325 MG tablet Commonly known as:  NORCO   predniSONE 10 MG tablet Commonly known as:  DELTASONE   temazepam 15 MG capsule Commonly known as:  RESTORIL     TAKE these medications   albuterol 108 (90 Base) MCG/ACT inhaler Commonly known as:  PROVENTIL HFA;VENTOLIN  HFA Inhale 1-2 puffs into the lungs every 6 (six) hours as needed for wheezing or shortness of breath.   allopurinol 100 MG tablet Commonly known as:  ZYLOPRIM Take 100 mg by mouth daily.   ALPRAZolam 0.25 MG tablet Commonly known as:  XANAX Take 1 tablet (0.25 mg total) by mouth 2 (two) times daily as needed for anxiety. What changed:    medication strength  how much to take  when to take this  reasons to take this  additional instructions   bisoprolol 5 MG tablet Commonly known as:  ZEBETA Take 1 tablet (5 mg total) by mouth daily.   budesonide-formoterol 160-4.5 MCG/ACT inhaler Commonly known as:  SYMBICORT Inhale 2 puffs into the lungs 2 (two) times daily.   calcium carbonate 600 MG Tabs tablet Commonly known as:  OS-CAL Take 600 mg by mouth 2 (two) times daily.   cetirizine 10 MG tablet Commonly known as:   ZYRTEC Take 10 mg by mouth daily.   clobetasol cream 0.05 % Commonly known as:  TEMOVATE Apply 1 application topically 2 (two) times daily as needed (FOR SKIN IRRITATION/REDNESS & BLISTERING ON LEGS).   CRESTOR 10 MG tablet Generic drug:  rosuvastatin Take 10 mg by mouth at bedtime.   diltiazem 120 MG 24 hr capsule Commonly known as:  CARDIZEM CD Take 1 capsule (120 mg total) by mouth daily.   furosemide 40 MG tablet Commonly known as:  LASIX Take 1 tablet (40 mg total) by mouth 2 (two) times daily.   levothyroxine 50 MCG tablet Commonly known as:  SYNTHROID, LEVOTHROID Take 50 mcg by mouth daily before breakfast.   magnesium oxide 400 MG tablet Commonly known as:  MAG-OX Take 400 mg by mouth at bedtime.   mometasone 50 MCG/ACT nasal spray Commonly known as:  NASONEX Place 2 sprays into the nose at bedtime.   multivitamin with minerals Tabs tablet Take 1 tablet by mouth daily.   omeprazole 20 MG capsule Commonly known as:  PRILOSEC Take 20 mg by mouth daily.   OXYGEN Inhale 2 L into the lungs daily.   Potassium Chloride ER 20 MEQ Tbcr Take 20 mEq by mouth 2 (two) times daily. What changed:    medication strength  how much to take  when to take this   rOPINIRole 3 MG tablet Commonly known as:  REQUIP Take 3 mg by mouth 2 (two) times daily.   warfarin 2 MG tablet Commonly known as:  COUMADIN Take as directed. If you are unsure how to take this medication, talk to your nurse or doctor. Original instructions:  Takes one tablet (2mg ) everyday Start taking on:  05/08/2018 What changed:  These instructions start on 05/08/2018. If you are unsure what to do until then, ask your doctor or other care provider.            Durable Medical Equipment  (From admission, onward)        Start     Ordered   05/05/18 1103  For home use only DME Walker rolling  Once    Question:  Patient needs a walker to treat with the following condition  Answer:  CHF (congestive  heart failure) ([place])   05/05/18 1103     Follow-up Information    Octavio Graves, DO. Schedule an appointment as soon as possible for a visit in 1 week(s).   Contact information: [address] [address] [phone]        Satira Sark, MD.  Schedule an appointment as soon as possible for a visit in 2 week(s).   Specialty:  Cardiology Contact information: [place] [address] [phone]          Allergies  Allergen Reactions  . Penicillins Hives, Itching, Swelling and Rash    Has patient had a PCN reaction causing immediate rash, facial/tongue/throat swelling, SOB or lightheadedness with hypotension: Yes Has patient had a PCN reaction causing severe rash involving mucus membranes or skin necrosis: Yes Has patient had a PCN reaction that required hospitalization: unknown Has patient had a PCN reaction occurring within the last 10 years: No If all of the above answers are "NO", then may proceed with Cephalosporin use.    Throat swelling   Allergies as of 05/05/2018      Reactions   Penicillins Hives, Itching, Swelling, Rash   Has patient had a PCN reaction causing immediate rash, facial/tongue/throat swelling, SOB or lightheadedness with hypotension: Yes Has patient had a PCN reaction causing severe rash involving mucus membranes or skin necrosis: Yes Has patient had a PCN reaction that required hospitalization: unknown Has patient had a PCN reaction occurring within the last 10 years: No If all of the above answers are "NO", then may proceed with Cephalosporin use. Throat swelling      Medication List    STOP taking these medications   azithromycin 250 MG tablet Commonly known as:  ZITHROMAX   benzonatate 200 MG capsule Commonly known as:  TESSALON   chlorpheniramine-HYDROcodone 10-8 MG/5ML Suer Commonly known as:  TUSSIONEX PENNKINETIC ER   cyclobenzaprine 10 MG tablet Commonly known as:  FLEXERIL   fluconazole  100 MG tablet Commonly known as:  DIFLUCAN   HYDROcodone-acetaminophen 10-325 MG tablet Commonly known as:  NORCO   predniSONE 10 MG tablet Commonly known as:  DELTASONE   temazepam 15 MG capsule Commonly known as:  RESTORIL     TAKE these medications   albuterol 108 (90 Base) MCG/ACT inhaler Commonly known as:  PROVENTIL HFA;VENTOLIN HFA Inhale 1-2 puffs into the lungs every 6 (six) hours as needed for wheezing or shortness of breath.   allopurinol 100 MG tablet Commonly known as:  ZYLOPRIM Take 100 mg by mouth daily.   ALPRAZolam 0.25 MG tablet Commonly known as:  XANAX Take 1 tablet (0.25 mg total) by mouth 2 (two) times daily as needed for anxiety. What changed:    medication strength  how much to take  when to take this  reasons to take this  additional instructions   bisoprolol 5 MG tablet Commonly known as:  ZEBETA Take 1 tablet (5 mg total) by mouth daily.   budesonide-formoterol 160-4.5 MCG/ACT inhaler Commonly known as:  SYMBICORT Inhale 2 puffs into the lungs 2 (two) times daily.   calcium carbonate 600 MG Tabs tablet Commonly known as:  OS-CAL Take 600 mg by mouth 2 (two) times daily.   cetirizine 10 MG tablet Commonly known as:  ZYRTEC Take 10 mg by mouth daily.   clobetasol cream 0.05 % Commonly known as:  TEMOVATE Apply 1 application topically 2 (two) times daily as needed (FOR SKIN IRRITATION/REDNESS & BLISTERING ON LEGS).   CRESTOR 10 MG tablet Generic drug:  rosuvastatin Take 10 mg by mouth at bedtime.   diltiazem 120 MG 24 hr capsule Commonly known as:  CARDIZEM CD Take 1 capsule (120 mg total) by mouth daily.   furosemide 40 MG tablet Commonly known as:  LASIX Take 1 tablet (40 mg total) by  mouth 2 (two) times daily.   levothyroxine 50 MCG tablet Commonly known as:  SYNTHROID, LEVOTHROID Take 50 mcg by mouth daily before breakfast.   magnesium oxide 400 MG tablet Commonly known as:  MAG-OX Take 400 mg by mouth at  bedtime.   mometasone 50 MCG/ACT nasal spray Commonly known as:  NASONEX Place 2 sprays into the nose at bedtime.   multivitamin with minerals Tabs tablet Take 1 tablet by mouth daily.   omeprazole 20 MG capsule Commonly known as:  PRILOSEC Take 20 mg by mouth daily.   OXYGEN Inhale 2 L into the lungs daily.   Potassium Chloride ER 20 MEQ Tbcr Take 20 mEq by mouth 2 (two) times daily. What changed:    medication strength  how much to take  when to take this   rOPINIRole 3 MG tablet Commonly known as:  REQUIP Take 3 mg by mouth 2 (two) times daily.   warfarin 2 MG tablet Commonly known as:  COUMADIN Take as directed. If you are unsure how to take this medication, talk to your nurse or doctor. Original instructions:  Takes one tablet (2mg ) everyday Start taking on:  05/08/2018 What changed:  These instructions start on 05/08/2018. If you are unsure what to do until then, ask your doctor or other care provider.            Durable Medical Equipment  (From admission, onward)        Start     Ordered   05/05/18 1103  For home use only DME Walker rolling  Once    Question:  Patient needs a walker to treat with the following condition  Answer:  CHF (congestive heart failure) ([place])   05/05/18 1103      Procedures/Studies: Dg Chest 2 View  Result Date: 05/02/2018 CLINICAL DATA:  CHF, short of breath EXAM: CHEST - 2 VIEW COMPARISON:  04/27/2018 FINDINGS: Progression of vascular congestion and bilateral airspace disease right greater than left. Progression of small bilateral pleural effusions. Prior mitral valve replacement with cardiac enlargement. IMPRESSION: Progressive bilateral airspace disease and small effusions consistent with congestive heart failure and edema. Electronically Signed   By: Franchot Gallo M.D.   On: 05/02/2018 08:53   Dg Chest 2 View  Result Date: 04/27/2018 CLINICAL DATA:  Increased dyspnea this week. EXAM: CHEST - 2 VIEW COMPARISON:  03/30/2018  FINDINGS: Stable cardiomegaly with aortic atherosclerosis. Aortic valvular replacement with median sternotomy sutures are again redemonstrated. Mild central vascular congestion is seen without pulmonary consolidation, effusion or pneumothorax. No acute osseous abnormality. IMPRESSION: Stable cardiomegaly with aortic atherosclerosis. Mild pulmonary vascular congestion. Electronically Signed   By: Ashley Royalty M.D.   On: 04/27/2018 16:22   Dg Chest Port 1 View  Result Date: 05/04/2018 CLINICAL DATA:  Congestive heart failure. EXAM: PORTABLE CHEST 1 VIEW COMPARISON:  05/02/2018 and 04/27/2018 FINDINGS: Chronic cardiomegaly.  CABG.  Aortic valve replacement. Pulmonary vascularity is normal. Left lung is now clear. Slight residual haziness at the right lung base probably represents a small effusion. IMPRESSION: Resolution of mild pulmonary edema.  Small residual right effusion. Electronically Signed   By: Lorriane Shire M.D.   On: 05/04/2018 10:21      Subjective: Patient says she is feeling much better today.  She is having no chest pain or shortness of breath.  She reports that she would like to go home today.  She is agreeable to having a home health nurse check her PT/INR tomorrow and reported to her  anticoagulation clinic.  Discharge Exam: Vitals:   05/05/18 0802 05/05/18 0912  BP:  103/80  Pulse:  88  Resp:  17  Temp:  98.5 F (36.9 C)  SpO2: 99% 96%   Vitals:   05/05/18 0557 05/05/18 0757 05/05/18 0802 05/05/18 0912  BP: 111/77   103/80  Pulse: 82   88  Resp: 18   17  Temp: 98.3 F (36.8 C)   98.5 F (36.9 C)  TempSrc: Oral   Oral  SpO2: 93% 97% 99% 96%  Weight:      Height:       General exam: awake, alert, oriented x 3, NAD. Cooperative.  Respiratory system:  No increased work of breathing. Cardiovascular system: S1 & S2 heard, irregular.  Gastrointestinal system: Abdomen is nondistended, soft and nontender. Normal bowel sounds heard. Central nervous system: Alert and  oriented. No focal neurological deficits. Extremities: 1+ edema BLE, dermatitis BLEs.    The results of significant diagnostics from this hospitalization (including imaging, microbiology, ancillary and laboratory) are listed below for reference.     Microbiology: Recent Results (from the past 240 hour(s))  MRSA PCR Screening     Status: None   Collection Time: 05/02/18  1:52 PM  Result Value Ref Range Status   MRSA by PCR NEGATIVE NEGATIVE Final    Comment:        The GeneXpert MRSA Assay (FDA approved for NASAL specimens only), is one component of a comprehensive MRSA colonization surveillance program. It is not intended to diagnose MRSA infection nor to guide or monitor treatment for MRSA infections. Performed at [facility], [address], [place], [place] [address]      Labs: BNP (last 3 results) Recent Labs    03/24/18 1754 04/27/18 1636 05/02/18 0753  BNP 336.0* 543.0* 706.2*   Basic Metabolic Panel: Recent Labs  Lab 05/02/18 0753 05/03/18 0236 05/04/18 0507 05/05/18 0445  NA 137 136 140 140  K 4.3 3.6 3.5 3.3*  CL 96* 94* 96* 97*  CO2 34* 33* 36* 34*  GLUCOSE 77 77 91 96  BUN 30* 23 18 15   CREATININE 1.75* 1.29* 1.14* 1.10*  CALCIUM 8.7* 8.1* 8.4* 8.5*   Liver Function Tests: Recent Labs  Lab 05/02/18 0753  AST 98*  ALT 109*  ALKPHOS 88  BILITOT 1.1  PROT 6.6  ALBUMIN 3.4*   No results for input(s): LIPASE, AMYLASE in the last 168 hours. No results for input(s): AMMONIA in the last 168 hours. CBC: Recent Labs  Lab 05/02/18 0753 05/03/18 0236 05/05/18 0445  WBC 10.2 6.6 7.6  HGB 10.2* 9.6* 9.5*  HCT 34.8* 32.7* 31.8*  MCV 91.1 90.6 88.3  PLT 191 180 194   Cardiac Enzymes: Recent Labs  Lab 05/02/18 0753 05/02/18 1509 05/02/18 1931 05/03/18 0236  TROPONINI 0.06* 0.06* 0.06* 0.06*   BNP: Invalid input(s): POCBNP CBG: No results for input(s): GLUCAP in the last 168 hours. D-Dimer No results for input(s): DDIMER in the last  72 hours. Hgb A1c No results for input(s): HGBA1C in the last 72 hours. Lipid Profile No results for input(s): CHOL, HDL, LDLCALC, TRIG, CHOLHDL, LDLDIRECT in the last 72 hours. Thyroid function studies No results for input(s): TSH, T4TOTAL, T3FREE, THYROIDAB in the last 72 hours.  Invalid input(s): FREET3 Anemia work up No results for input(s): VITAMINB12, FOLATE, FERRITIN, TIBC, IRON, RETICCTPCT in the last 72 hours. Urinalysis No results found for: COLORURINE, APPEARANCEUR, LABSPEC, [place], GLUCOSEU, [place], [place], [place], PROTEINUR, UROBILINOGEN, NITRITE, LEUKOCYTESUR Sepsis Labs  Invalid input(s): PROCALCITONIN,  WBC,  LACTICIDVEN Microbiology Recent Results (from the past 240 hour(s))  MRSA PCR Screening     Status: None   Collection Time: 05/02/18  1:52 PM  Result Value Ref Range Status   MRSA by PCR NEGATIVE NEGATIVE Final    Comment:        The GeneXpert MRSA Assay (FDA approved for NASAL specimens only), is one component of a comprehensive MRSA colonization surveillance program. It is not intended to diagnose MRSA infection nor to guide or monitor treatment for MRSA infections. Performed at [facility], [address] [place],  [address]    Time coordinating discharge: 36 minutes   SIGNED:  Irwin Brakeman, MD  [facility] 05/05/2018, 11:18 AM Pager [phone]  If 7PM-7AM, please contact night-coverage [email or web address] Password [email or web address]

## 2018-05-05 NOTE — Care Management Important Message (Signed)
Important Message  Patient Details  Name: [name] MRN: [id number] Date of Birth: 10/14/1951   Medicare Important Message Given:  Yes    Shelda Altes 05/05/2018, 11:38 AM

## 2018-05-05 NOTE — Progress Notes (Signed)
ANTICOAGULATION CONSULT NOTE - Follow Up   Pharmacy Consult for:  warfarin dosing  Indication: atrial fibrillation INR Goal range: 2-3 INR today:8.45 Albumin: 3.4 (7-14) AST/ALT: 98/109 (7-14) Home warfarin dose: 3mg  daily, except 2mg  on Sunday and Thursday (19mg  weekly) Last warfarin dose: 05-01-18 Severe Drug Interactions: pt was taking fluconazole and azithromycin prior to admission Concurrent anti-platelet meds: n/a Factors affecting warfarin sensitivity: CHF, renal insufficiency, LFT elevation   Patient Measurements: Height: 5\' 8"  (172.7 cm) Weight: 205 lb 4 oz (93.1 kg) IBW/kg (Calculated) : 63.9 Heparin Dosing Weight: HEPARIN DW (KG): 84.2  Vital Signs: Temp: 98.5 F (36.9 C) (07/17 0912) Temp Source: Oral (07/17 0912) BP: 103/80 (07/17 0912) Pulse Rate: 88 (07/17 0912)  Labs: Recent Labs    05/02/18 1509 05/02/18 1931 05/03/18 0236 05/04/18 0507 05/05/18 0445  HGB  --   --  9.6*  --  9.5*  HCT  --   --  32.7*  --  31.8*  PLT  --   --  180  --  194  LABPROT  --   --  69.4* 52.3* 50.7*  INR  --   --  8.45* 5.89* 5.65*  CREATININE  --   --  1.29* 1.14* 1.10*  TROPONINI 0.06* 0.06* 0.06*  --   --    Estimated Creatinine Clearance: 60 mL/min (A) (by C-G formula based on SCr of 1.1 mg/dL (H)).  Medical History: Past Medical History:  Diagnosis Date  . Acute on chronic diastolic (congestive) heart failure ([place])   . Anxiety disorder   . Aortic stenosis    Status post St. Jude mechanical AVR 2007  . Asthma   . Atrial fibrillation ([place])   . Carcinoid tumor of colon 2007  . Coronary atherosclerosis of native coronary artery    Status post CABG 2007  . GERD (gastroesophageal reflux disease)   . History of colonoscopy 2003   Dr. Gala Romney - normal  . Hyperlipidemia   . Macromastia   . Pedal edema    Bilateral, chronic  . Peptic stricture of esophagus 10/04/2010   GE junction on last EGD by Dr. Gala Romney, benign biopsies  . RLS (restless legs syndrome)   .  Schatzki's ring     Medications:  Medications Prior to Admission  Medication Sig Dispense Refill Last Dose  . albuterol (PROVENTIL HFA;VENTOLIN HFA) 108 (90 BASE) MCG/ACT inhaler Inhale 1-2 puffs into the lungs every 6 (six) hours as needed for wheezing or shortness of breath.    Past Month at Unknown time  . allopurinol (ZYLOPRIM) 100 MG tablet Take 100 mg by mouth daily.   05/01/2018 at Unknown time  . ALPRAZolam (XANAX) 1 MG tablet Take 1 mg by mouth every morning. *May take one tablet two times daily as needed for anxiety*   05/01/2018 at Unknown time  . benzonatate (TESSALON) 200 MG capsule Take 1 capsule (200 mg total) by mouth 2 (two) times daily as needed for cough. (Patient taking differently: Take 200 mg by mouth 2 (two) times daily. *May take one capsule every 8 hours as needed for cough*) 20 capsule 0 unkonwn  . bisoprolol (ZEBETA) 5 MG tablet Take 1 tablet (5 mg total) by mouth daily. 30 tablet 3 05/01/2018 at 1400  . budesonide-formoterol (SYMBICORT) 160-4.5 MCG/ACT inhaler Inhale 2 puffs into the lungs 2 (two) times daily. 1 Inhaler 3 05/01/2018 at Unknown time  . calcium carbonate (OS-CAL) 600 MG TABS tablet Take 600 mg by mouth 2 (two) times daily.   05/01/2018  at Unknown time  . cetirizine (ZYRTEC) 10 MG tablet Take 10 mg by mouth daily.     05/01/2018 at Unknown time  . clobetasol cream (TEMOVATE) 3.70 % Apply 1 application topically 2 (two) times daily as needed (FOR SKIN IRRITATION/REDNESS & BLISTERING ON LEGS).    Past Month at Unknown time  . CRESTOR 10 MG tablet Take 10 mg by mouth at bedtime.    05/01/2018 at Unknown time  . cyclobenzaprine (FLEXERIL) 10 MG tablet Take 10 mg by mouth 3 (three) times daily as needed for muscle spasms.    Past Week at Unknown time  . diltiazem (CARDIZEM CD) 120 MG 24 hr capsule Take 1 capsule (120 mg total) by mouth daily. 30 capsule 2 05/01/2018 at Unknown time  . fluconazole (DIFLUCAN) 100 MG tablet Take 100 mg by mouth daily.  0 05/01/2018 at  Unknown time  . furosemide (LASIX) 40 MG tablet Take 1 tablet (40 mg total) by mouth 2 (two) times daily. 60 tablet 2 05/01/2018 at Unknown time  . HYDROcodone-acetaminophen (NORCO) 10-325 MG per tablet Take 1 tablet by mouth at bedtime. *May take one tablet every 12 hours as needed for pain*   05/01/2018 at Unknown time  . levothyroxine (SYNTHROID, LEVOTHROID) 50 MCG tablet Take 50 mcg by mouth daily before breakfast.    05/01/2018 at Unknown time  . magnesium oxide (MAG-OX) 400 MG tablet Take 400 mg by mouth at bedtime.   05/01/2018 at Unknown time  . mometasone (NASONEX) 50 MCG/ACT nasal spray Place 2 sprays into the nose at bedtime.  0 05/01/2018 at Unknown time  . Multiple Vitamin (MULTIVITAMIN WITH MINERALS) TABS tablet Take 1 tablet by mouth daily.   05/01/2018 at Unknown time  . omeprazole (PRILOSEC) 20 MG capsule Take 20 mg by mouth daily.   05/01/2018 at Unknown time  . potassium chloride (K-DUR) 10 MEQ tablet Take 1 tablet (10 mEq total) by mouth daily. 90 tablet 3 05/01/2018 at Unknown time  . rOPINIRole (REQUIP) 3 MG tablet Take 3 mg by mouth 2 (two) times daily.    05/01/2018 at Unknown time  . temazepam (RESTORIL) 15 MG capsule Take 15 mg by mouth at bedtime.    05/01/2018 at Unknown time  . warfarin (COUMADIN) 2 MG tablet Takes one tablet (2mg ) everyday (Patient taking differently: Take 3 mg by mouth every evening. ) 45 tablet 3 05/01/2018 at 1400  . azithromycin (ZITHROMAX) 250 MG tablet Take 1 tablet (250 mg total) by mouth daily. Take first 2 tablets together, then 1 every day until finished. (Patient not taking: Reported on 05/02/2018) 6 tablet 0 Completed Course at Unknown time  . chlorpheniramine-HYDROcodone (TUSSIONEX PENNKINETIC ER) 10-8 MG/5ML SUER Take 5 mLs by mouth every 12 (twelve) hours as needed for cough. (Patient not taking: Reported on 05/02/2018) 120 mL 0 Completed Course at Unknown time  . OXYGEN Inhale 2 L into the lungs daily.    04/27/2018 at Unknown time  . predniSONE  (DELTASONE) 10 MG tablet Take 4 tablets by mouth daily for 5 days and stop prednisone. (Patient not taking: Reported on 04/27/2018) 20 tablet 0 Completed Course at Unknown time   Assessment: This ia 74 yof with atrial fibrillation (S/P St. Jude valve in 2007), admitted for acute CHF exacerbation.  She was taking 19mg  of warfarin weekly as an outpatient. INR supra-therapeutic elevation likely due to drug interactions with use of fluconazole (major interaction) and azithromycin courses  which were finished 7/14.  Liver congestion may also be  an issue for this patient. INR remains supratherpeutic, but trending down slowly  Goal of Therapy:  INR 2-3 Monitor platelets by anticoagulation protocol: Yes   Plan:  Hold warfarin today for supra-therapeutic INR  Daily CBC and PT-INR Monitor for S-S of bleeding.  Isac Sarna, BS Pharm D, [place] Clinical Pharmacist Pager [phone] 05/05/2018 10:32 AM

## 2018-05-05 NOTE — Progress Notes (Signed)
[name] discharged Home per MD order.  Discharge instructions reviewed and discussed with the patient, all questions and concerns answered. Copy of instructions and scripts given to patient.  Allergies as of 05/05/2018      Reactions   Penicillins Hives, Itching, Swelling, Rash   Has patient had a PCN reaction causing immediate rash, facial/tongue/throat swelling, SOB or lightheadedness with hypotension: Yes Has patient had a PCN reaction causing severe rash involving mucus membranes or skin necrosis: Yes Has patient had a PCN reaction that required hospitalization: unknown Has patient had a PCN reaction occurring within the last 10 years: No If all of the above answers are "NO", then may proceed with Cephalosporin use. Throat swelling      Medication List    STOP taking these medications   azithromycin 250 MG tablet Commonly known as:  ZITHROMAX   benzonatate 200 MG capsule Commonly known as:  TESSALON   chlorpheniramine-HYDROcodone 10-8 MG/5ML Suer Commonly known as:  TUSSIONEX PENNKINETIC ER   cyclobenzaprine 10 MG tablet Commonly known as:  FLEXERIL   fluconazole 100 MG tablet Commonly known as:  DIFLUCAN   HYDROcodone-acetaminophen 10-325 MG tablet Commonly known as:  NORCO   predniSONE 10 MG tablet Commonly known as:  DELTASONE   temazepam 15 MG capsule Commonly known as:  RESTORIL     TAKE these medications   albuterol 108 (90 Base) MCG/ACT inhaler Commonly known as:  PROVENTIL HFA;VENTOLIN HFA Inhale 1-2 puffs into the lungs every 6 (six) hours as needed for wheezing or shortness of breath.   allopurinol 100 MG tablet Commonly known as:  ZYLOPRIM Take 100 mg by mouth daily.   ALPRAZolam 0.25 MG tablet Commonly known as:  XANAX Take 1 tablet (0.25 mg total) by mouth 2 (two) times daily as needed for anxiety. What changed:    medication strength  how much to take  when to take this  reasons to take this  additional instructions    bisoprolol 5 MG tablet Commonly known as:  ZEBETA Take 1 tablet (5 mg total) by mouth daily.   budesonide-formoterol 160-4.5 MCG/ACT inhaler Commonly known as:  SYMBICORT Inhale 2 puffs into the lungs 2 (two) times daily.   calcium carbonate 600 MG Tabs tablet Commonly known as:  OS-CAL Take 600 mg by mouth 2 (two) times daily.   cetirizine 10 MG tablet Commonly known as:  ZYRTEC Take 10 mg by mouth daily.   clobetasol cream 0.05 % Commonly known as:  TEMOVATE Apply 1 application topically 2 (two) times daily as needed (FOR SKIN IRRITATION/REDNESS & BLISTERING ON LEGS).   CRESTOR 10 MG tablet Generic drug:  rosuvastatin Take 10 mg by mouth at bedtime.   diltiazem 120 MG 24 hr capsule Commonly known as:  CARDIZEM CD Take 1 capsule (120 mg total) by mouth daily.   furosemide 40 MG tablet Commonly known as:  LASIX Take 1 tablet (40 mg total) by mouth 2 (two) times daily.   levothyroxine 50 MCG tablet Commonly known as:  SYNTHROID, LEVOTHROID Take 50 mcg by mouth daily before breakfast.   magnesium oxide 400 MG tablet Commonly known as:  MAG-OX Take 400 mg by mouth at bedtime.   mometasone 50 MCG/ACT nasal spray Commonly known as:  NASONEX Place 2 sprays into the nose at bedtime.   multivitamin with minerals Tabs tablet Take 1 tablet by mouth daily.   omeprazole 20 MG capsule Commonly known as:  PRILOSEC Take 20 mg by mouth daily.   OXYGEN Inhale  2 L into the lungs daily.   Potassium Chloride ER 20 MEQ Tbcr Take 20 mEq by mouth 2 (two) times daily. What changed:    medication strength  how much to take  when to take this   rOPINIRole 3 MG tablet Commonly known as:  REQUIP Take 3 mg by mouth 2 (two) times daily.   warfarin 2 MG tablet Commonly known as:  COUMADIN Take as directed. If you are unsure how to take this medication, talk to your nurse or doctor. Original instructions:  Takes one tablet (2mg ) everyday Start taking on:  05/08/2018 What  changed:  These instructions start on 05/08/2018. If you are unsure what to do until then, ask your doctor or other care provider.       Patients skin is clean, dry and intact, no evidence of skin break down. IV site discontinued and catheter remains intact. Site without signs and symptoms of complications. Dressing and pressure applied.  Patient escorted to car in a wheelchair,  no distress noted upon discharge.  [name] [name] 05/05/2018 5:17 PM

## 2018-05-06 ENCOUNTER — Ambulatory Visit (INDEPENDENT_AMBULATORY_CARE_PROVIDER_SITE_OTHER): Payer: Medicare HMO | Admitting: *Deleted

## 2018-05-06 ENCOUNTER — Telehealth: Payer: Self-pay | Admitting: Cardiology

## 2018-05-06 DIAGNOSIS — E785 Hyperlipidemia, unspecified: Secondary | ICD-10-CM | POA: Diagnosis not present

## 2018-05-06 DIAGNOSIS — I4891 Unspecified atrial fibrillation: Secondary | ICD-10-CM

## 2018-05-06 DIAGNOSIS — N183 Chronic kidney disease, stage 3 (moderate): Secondary | ICD-10-CM | POA: Diagnosis not present

## 2018-05-06 DIAGNOSIS — Z5181 Encounter for therapeutic drug level monitoring: Secondary | ICD-10-CM | POA: Diagnosis not present

## 2018-05-06 DIAGNOSIS — I5033 Acute on chronic diastolic (congestive) heart failure: Secondary | ICD-10-CM | POA: Diagnosis not present

## 2018-05-06 DIAGNOSIS — R69 Illness, unspecified: Secondary | ICD-10-CM | POA: Diagnosis not present

## 2018-05-06 DIAGNOSIS — Z952 Presence of prosthetic heart valve: Secondary | ICD-10-CM | POA: Diagnosis not present

## 2018-05-06 DIAGNOSIS — G8929 Other chronic pain: Secondary | ICD-10-CM | POA: Diagnosis not present

## 2018-05-06 DIAGNOSIS — J9622 Acute and chronic respiratory failure with hypercapnia: Secondary | ICD-10-CM | POA: Diagnosis not present

## 2018-05-06 DIAGNOSIS — E039 Hypothyroidism, unspecified: Secondary | ICD-10-CM | POA: Diagnosis not present

## 2018-05-06 DIAGNOSIS — J449 Chronic obstructive pulmonary disease, unspecified: Secondary | ICD-10-CM | POA: Diagnosis not present

## 2018-05-06 DIAGNOSIS — I482 Chronic atrial fibrillation: Secondary | ICD-10-CM | POA: Diagnosis not present

## 2018-05-06 DIAGNOSIS — L989 Disorder of the skin and subcutaneous tissue, unspecified: Secondary | ICD-10-CM | POA: Diagnosis not present

## 2018-05-06 LAB — POCT INR: INR: 5 — AB (ref 2.0–3.0)

## 2018-05-06 NOTE — Telephone Encounter (Signed)
Done.  See coumadin note. 

## 2018-05-06 NOTE — Telephone Encounter (Signed)
50

## 2018-05-06 NOTE — Patient Instructions (Signed)
D/C from [facility] 7/17  D/C INR 5.65 Hold coumadin tonight and tomorrow night.  Take coumadin 1mg  on Saturday and Sunday and recheck INR on Monday  Order given to [place].

## 2018-05-10 ENCOUNTER — Telehealth: Payer: Self-pay | Admitting: Cardiology

## 2018-05-10 ENCOUNTER — Ambulatory Visit (INDEPENDENT_AMBULATORY_CARE_PROVIDER_SITE_OTHER): Payer: Medicare HMO | Admitting: *Deleted

## 2018-05-10 DIAGNOSIS — I4891 Unspecified atrial fibrillation: Secondary | ICD-10-CM

## 2018-05-10 DIAGNOSIS — Z5181 Encounter for therapeutic drug level monitoring: Secondary | ICD-10-CM | POA: Diagnosis not present

## 2018-05-10 DIAGNOSIS — Z952 Presence of prosthetic heart valve: Secondary | ICD-10-CM | POA: Diagnosis not present

## 2018-05-10 LAB — POCT INR: INR: 2.1 (ref 2.0–3.0)

## 2018-05-10 NOTE — Patient Instructions (Signed)
Continue coumadin 1/2 tablet daily (1mg ) Recheck INR on Thursday 7/25 Order given to [place].

## 2018-05-10 NOTE — Telephone Encounter (Signed)
[name] (home health) called with results   PT  25.7 INR 2.1  Please call [phone]

## 2018-05-10 NOTE — Telephone Encounter (Signed)
Done.  See coumadin note. 

## 2018-05-13 ENCOUNTER — Telehealth: Payer: Self-pay | Admitting: Cardiology

## 2018-05-13 ENCOUNTER — Ambulatory Visit (INDEPENDENT_AMBULATORY_CARE_PROVIDER_SITE_OTHER): Payer: Medicare HMO | Admitting: *Deleted

## 2018-05-13 DIAGNOSIS — I4891 Unspecified atrial fibrillation: Secondary | ICD-10-CM | POA: Diagnosis not present

## 2018-05-13 DIAGNOSIS — Z5181 Encounter for therapeutic drug level monitoring: Secondary | ICD-10-CM

## 2018-05-13 DIAGNOSIS — Z952 Presence of prosthetic heart valve: Secondary | ICD-10-CM | POA: Diagnosis not present

## 2018-05-13 LAB — POCT INR: INR: 1.4 — AB (ref 2.0–3.0)

## 2018-05-13 NOTE — Telephone Encounter (Signed)
14

## 2018-05-13 NOTE — Patient Instructions (Signed)
Increase coumadin to 2mg  daily Recheck INR on Monday 7/29 Order given to [place].

## 2018-05-13 NOTE — Telephone Encounter (Signed)
Done.  See coumadin note. 

## 2018-05-17 ENCOUNTER — Ambulatory Visit (INDEPENDENT_AMBULATORY_CARE_PROVIDER_SITE_OTHER): Payer: Medicare HMO | Admitting: *Deleted

## 2018-05-17 ENCOUNTER — Telehealth: Payer: Self-pay | Admitting: Cardiology

## 2018-05-17 DIAGNOSIS — E039 Hypothyroidism, unspecified: Secondary | ICD-10-CM | POA: Diagnosis not present

## 2018-05-17 DIAGNOSIS — I38 Endocarditis, valve unspecified: Secondary | ICD-10-CM | POA: Diagnosis not present

## 2018-05-17 DIAGNOSIS — N183 Chronic kidney disease, stage 3 (moderate): Secondary | ICD-10-CM | POA: Diagnosis not present

## 2018-05-17 DIAGNOSIS — I5042 Chronic combined systolic (congestive) and diastolic (congestive) heart failure: Secondary | ICD-10-CM | POA: Diagnosis not present

## 2018-05-17 DIAGNOSIS — Z952 Presence of prosthetic heart valve: Secondary | ICD-10-CM | POA: Diagnosis not present

## 2018-05-17 DIAGNOSIS — J449 Chronic obstructive pulmonary disease, unspecified: Secondary | ICD-10-CM | POA: Diagnosis not present

## 2018-05-17 DIAGNOSIS — R69 Illness, unspecified: Secondary | ICD-10-CM | POA: Diagnosis not present

## 2018-05-17 DIAGNOSIS — K219 Gastro-esophageal reflux disease without esophagitis: Secondary | ICD-10-CM | POA: Diagnosis not present

## 2018-05-17 DIAGNOSIS — L989 Disorder of the skin and subcutaneous tissue, unspecified: Secondary | ICD-10-CM | POA: Diagnosis not present

## 2018-05-17 DIAGNOSIS — I5033 Acute on chronic diastolic (congestive) heart failure: Secondary | ICD-10-CM | POA: Diagnosis not present

## 2018-05-17 DIAGNOSIS — E785 Hyperlipidemia, unspecified: Secondary | ICD-10-CM | POA: Diagnosis not present

## 2018-05-17 DIAGNOSIS — I4891 Unspecified atrial fibrillation: Secondary | ICD-10-CM | POA: Diagnosis not present

## 2018-05-17 DIAGNOSIS — J9622 Acute and chronic respiratory failure with hypercapnia: Secondary | ICD-10-CM | POA: Diagnosis not present

## 2018-05-17 DIAGNOSIS — G8929 Other chronic pain: Secondary | ICD-10-CM | POA: Diagnosis not present

## 2018-05-17 DIAGNOSIS — E782 Mixed hyperlipidemia: Secondary | ICD-10-CM | POA: Diagnosis not present

## 2018-05-17 DIAGNOSIS — I482 Chronic atrial fibrillation: Secondary | ICD-10-CM | POA: Diagnosis not present

## 2018-05-17 LAB — POCT INR: INR: 1.2 — AB (ref 2.0–3.0)

## 2018-05-17 NOTE — Telephone Encounter (Signed)
12  

## 2018-05-17 NOTE — Telephone Encounter (Signed)
Done.  See coumadin note. 

## 2018-05-17 NOTE — Patient Instructions (Signed)
Take coumadin 4mg  tonight and tomorrow night, 3mg  on Wednesday night and come for INR check on Thursday 8/1 in the office. LM on home phone and cell phone Order given to [place].  Pt d/c from [facility] today

## 2018-05-19 DIAGNOSIS — Z7901 Long term (current) use of anticoagulants: Secondary | ICD-10-CM | POA: Diagnosis not present

## 2018-05-19 DIAGNOSIS — I5033 Acute on chronic diastolic (congestive) heart failure: Secondary | ICD-10-CM | POA: Diagnosis not present

## 2018-05-19 DIAGNOSIS — J441 Chronic obstructive pulmonary disease with (acute) exacerbation: Secondary | ICD-10-CM | POA: Diagnosis not present

## 2018-05-20 ENCOUNTER — Ambulatory Visit: Payer: Medicare HMO | Admitting: *Deleted

## 2018-05-20 DIAGNOSIS — Z5181 Encounter for therapeutic drug level monitoring: Secondary | ICD-10-CM

## 2018-05-20 DIAGNOSIS — I4891 Unspecified atrial fibrillation: Secondary | ICD-10-CM | POA: Diagnosis not present

## 2018-05-20 DIAGNOSIS — Z952 Presence of prosthetic heart valve: Secondary | ICD-10-CM

## 2018-05-20 DIAGNOSIS — Z7901 Long term (current) use of anticoagulants: Secondary | ICD-10-CM | POA: Diagnosis not present

## 2018-05-20 LAB — POCT INR: INR: 1.7 — AB (ref 2.0–3.0)

## 2018-05-20 NOTE — Patient Instructions (Signed)
Take coumadin 2 tablets tonight then start 1 1/2 tablets daily. Recheck on 05/25/18

## 2018-05-25 ENCOUNTER — Telehealth: Payer: Self-pay | Admitting: Cardiology

## 2018-05-25 NOTE — Telephone Encounter (Signed)
Has questions about how to take coumadin for the rest of this week

## 2018-05-25 NOTE — Telephone Encounter (Signed)
Per last anticoagulation note on 05/20/18, pt was to start taking 1.5 tablets daily.  Called spoke with pt advised to continue on 1.5 tablets daily until recheck. Recheck had been rescheduled from 05/25/18 to 06/08/18, pt's INR's have been fluctuating and not safe to wait for recheck until 06/08/18.  Scheduled pt sooner appt on 05/27/18 at 4pm.

## 2018-05-26 DIAGNOSIS — I10 Essential (primary) hypertension: Secondary | ICD-10-CM | POA: Diagnosis not present

## 2018-05-26 DIAGNOSIS — I482 Chronic atrial fibrillation: Secondary | ICD-10-CM | POA: Diagnosis not present

## 2018-05-26 DIAGNOSIS — I5033 Acute on chronic diastolic (congestive) heart failure: Secondary | ICD-10-CM | POA: Diagnosis not present

## 2018-05-26 DIAGNOSIS — J441 Chronic obstructive pulmonary disease with (acute) exacerbation: Secondary | ICD-10-CM | POA: Diagnosis not present

## 2018-05-31 DIAGNOSIS — J9611 Chronic respiratory failure with hypoxia: Secondary | ICD-10-CM | POA: Diagnosis not present

## 2018-05-31 DIAGNOSIS — I10 Essential (primary) hypertension: Secondary | ICD-10-CM | POA: Diagnosis not present

## 2018-05-31 DIAGNOSIS — I5033 Acute on chronic diastolic (congestive) heart failure: Secondary | ICD-10-CM | POA: Diagnosis not present

## 2018-05-31 DIAGNOSIS — J449 Chronic obstructive pulmonary disease, unspecified: Secondary | ICD-10-CM | POA: Diagnosis not present

## 2018-06-01 ENCOUNTER — Telehealth: Payer: Self-pay | Admitting: Cardiology

## 2018-06-01 NOTE — Telephone Encounter (Signed)
Left message on phone for pt to continue coumadin 3mg  daily.  Pt to call back if she has more questions.

## 2018-06-01 NOTE — Telephone Encounter (Signed)
Patient has lost paper with instructions on how to take her medication

## 2018-06-07 NOTE — Progress Notes (Signed)
Cardiology Office Note  Date: 06/08/2018   ID: Amy, Mendez Jun 06, 1951, MRN [id number]  PCP: Octavio Graves, DO  Primary Cardiologist: Rozann Lesches, MD   Chief Complaint  Patient presents with  . Diastolic heart failure  . Valvular heart disease    History of Present Illness: [name] is a 67 y.o. female last seen in February.  She is here for a follow-up visit. I reviewed interval records, she was hospitalized in July with acute on chronic diastolic heart failure complicated by metabolic encephalopathy with hypercapnia.  She was treated on the hospitalist service, diuresed approximately 3-1/2 L.  Follow-up echocardiogram from June is outlined below.  She presents today stating that she feels much better, her weight is also down significantly.  She reports compliance with Lasix 40 mg twice daily.  I talked with her about using an additional Lasix pill if she gains 2 to 3 pounds in 24 hours or 5 pounds in a week.  She remains on Coumadin with follow-up in the anticoagulation clinic.  She does not report any spontaneous bleeding problems.  She continues to follow with Dr. Melina Copa.  Past Medical History:  Diagnosis Date  . Acute on chronic diastolic (congestive) heart failure ([place])   . Anxiety disorder   . Aortic stenosis    Status post St. Jude mechanical AVR 2007  . Asthma   . Atrial fibrillation ([place])   . Carcinoid tumor of colon 2007  . Coronary atherosclerosis of native coronary artery    Status post CABG 2007  . GERD (gastroesophageal reflux disease)   . History of colonoscopy 2003   Dr. Gala Romney - normal  . Hyperlipidemia   . Macromastia   . Pedal edema    Bilateral, chronic  . Peptic stricture of esophagus 10/04/2010   GE junction on last EGD by Dr. Gala Romney, benign biopsies  . RLS (restless legs syndrome)   . Schatzki's ring     Past Surgical History:  Procedure Laterality Date  . ABDOMINAL HYSTERECTOMY    . AORTIC VALVE REPLACEMENT  2007   #25  mm St. 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 APPENDECTOMY  2007  . BACK SURGERY     lumbar 4 and 5   . BIOPSY  02/05/2012   RMR:Two tongues of salmon-colored epithelium distal esophagus, very short-segment Barrett's s/p bx/Small hiatal hernia, otherwise normal stomach, D1, D2. Status post esophageal dilation. Biopsy showed GERD.  . Breast cyst removed     bilateral  . BREAST REDUCTION SURGERY    . CESAREAN SECTION    . 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 COLONOSCOPY  02/05/2012   ZSW:FUXNAT rectum, sigmoid diverticulosis,descending colon polyp , tubular adenoma  . CORONARY ARTERY BYPASS GRAFT     06/2006 - RIMA to RCA, SVG to RCA  . ESOPHAGOGASTRODUODENOSCOPY  10/03/2010   Dr. Gala Romney- schatzki's ring, shoft peptic stricture at GE junction.  . ESOPHAGOGASTRODUODENOSCOPY (EGD) WITH PROPOFOL N/A 02/08/2018   Procedure: ESOPHAGOGASTRODUODENOSCOPY (EGD) WITH PROPOFOL;  Surgeon: Daneil Dolin, MD;  Location: [facility];  Service: Endoscopy;  Laterality: N/A;  8:15am  . FOOT SURGERY     bilateral bunionectomy  . HERNIA REPAIR     with mesh  . LAPAROTOMY  2007   small bowel resection secondary to small bowel obstruction  . MALONEY DILATION  02/05/2012   Procedure: Venia Minks DILATION;  Surgeon: Daneil Dolin, MD;  Location: [facility] ORS;  Service: Endoscopy;  Laterality: N/A;  45mm   . MALONEY DILATION N/A 02/08/2018   Procedure: Venia Minks DILATION;  Surgeon: Daneil Dolin, MD;  Location: [facility] SU[facility];  Service: Endoscopy;  Laterality: N/A;  . Teeth removal      Current Outpatient Medications  Medication Sig Dispense Refill  . albuterol (PROVENTIL HFA;VENTOLIN HFA) 108 (90 BASE) MCG/ACT inhaler Inhale 1-2 puffs into the lungs every 6 (six) hours as needed for wheezing or shortness of breath.     . allopurinol (ZYLOPRIM) 100 MG tablet Take 100 mg by mouth daily.    Marland Kitchen ALPRAZolam (XANAX) 0.25 MG tablet Take 1 tablet (0.25 mg  total) by mouth 2 (two) times daily as needed for anxiety. (Patient taking differently: Take 1 mg by mouth 2 (two) times daily as needed for anxiety. ) 10 tablet 0  . bisoprolol (ZEBETA) 5 MG tablet Take 1 tablet (5 mg total) by mouth daily. 30 tablet 3  . budesonide-formoterol (SYMBICORT) 160-4.5 MCG/ACT inhaler Inhale 2 puffs into the lungs 2 (two) times daily. 1 Inhaler 3  . calcium carbonate (OS-CAL) 600 MG TABS tablet Take 600 mg by mouth 2 (two) times daily.    . cetirizine (ZYRTEC) 10 MG tablet Take 10 mg by mouth daily.      . CRESTOR 10 MG tablet Take 10 mg by mouth at bedtime.     Marland Kitchen diltiazem (CARDIZEM CD) 120 MG 24 hr capsule Take 1 capsule (120 mg total) by mouth daily. 30 capsule 2  . furosemide (LASIX) 40 MG tablet Take 1 tablet (40 mg total) by mouth 2 (two) times daily. (Patient taking differently: Take 80 mg by mouth daily. ) 60 tablet 2  . levothyroxine (SYNTHROID, LEVOTHROID) 50 MCG tablet Take 50 mcg by mouth daily before breakfast.     . Multiple Vitamin (MULTIVITAMIN WITH MINERALS) TABS tablet Take 1 tablet by mouth daily.    Marland Kitchen omeprazole (PRILOSEC) 20 MG capsule Take 20 mg by mouth daily.    . OXYGEN Inhale 2 L into the lungs daily.     . potassium chloride (KLOR-CON) 20 MEQ packet Take 20 mEq by mouth daily.    Marland Kitchen rOPINIRole (REQUIP) 3 MG tablet Take 3 mg by mouth 2 (two) times daily.     Marland Kitchen warfarin (COUMADIN) 2 MG tablet Takes one tablet (2mg ) everyday 45 tablet 3   No current facility-administered medications for this visit.    Allergies:  Penicillins   Social History: The patient  reports that she quit smoking about 29 years ago. Her smoking use included cigarettes. She has a 20.00 pack-year smoking history. She has never used smokeless tobacco. She reports that she does not drink alcohol or use drugs.   ROS:  Please see the history of present illness. Otherwise, complete review of systems is positive for none.  All other systems are reviewed and negative.   Physical  Exam: VS:  BP 112/82   Pulse 76   Ht 5\' 8"  (1.727 m)   Wt 179 lb (81.2 kg)   SpO2 98%   BMI 27.22 kg/m , BMI Body mass index is 27.22 kg/m.  Wt Readings from Last 3 Encounters:  06/08/18 179 lb (81.2 kg)  05/04/18 205 lb 4 oz (93.1 kg)  04/27/18 208 lb (94.3 kg)    General: Patient appears comfortable at rest. HEENT: Conjunctiva and lids normal, oropharynx clear. Neck: Supple, no elevated JVP or carotid bruits, no thyromegaly. Lungs: Decreased breath sounds without wheezing, nonlabored breathing at rest. Cardiac:  Irregularly irregular, no S3, crisp mechanical prosthetic click in S2, soft systolic murmur, no pericardial rub. Abdomen: Soft, nontender, bowel sounds present. Extremities: No pitting edema today, chronic venous stasis changes, distal pulses 1-2+. Skin: Warm and dry. Musculoskeletal: No kyphosis. Neuropsychiatric: Alert and oriented x3, affect grossly appropriate.  ECG: I personally reviewed the tracing from 05/02/2018 which showed atrial fibrillation nonspecific ST changes.  Recent Labwork: 03/24/2018: Magnesium 1.6 05/02/2018: ALT 109; AST 98; B Natriuretic Peptide 664.0 05/05/2018: BUN 15; Creatinine, Ser 1.10; Hemoglobin 9.5; Platelets 194; Potassium 3.3; Sodium 140   Other Studies Reviewed Today:  Echocardiogram 03/25/2018: Study Conclusions  - Left ventricle: The cavity size was normal. Wall thickness was   increased in a pattern of mild LVH. Systolic function was   vigorous. The estimated ejection fraction was in the range of 65%   to 70%. Wall motion was normal; there were no regional wall   motion abnormalities. The study was not technically sufficient to   allow evaluation of LV diastolic dysfunction due to atrial   fibrillation. - Aortic valve: A St. Jude Medical mechanical prosthesis was   present. There was trivial regurgitation. Mean gradient (S): 12   mm Hg. Valve area (VTI): 1.42 cm^2. - Mitral valve: Moderately calcified annulus. There was mild    regurgitation. - Left atrium: The atrium was at the upper limits of normal in   size. - Right atrium: Central venous pressure (est): 15 mm Hg. - Tricuspid valve: There was mild regurgitation. - Pulmonary arteries: PA peak pressure: 44 mm Hg (S). - Pericardium, extracardiac: There was no pericardial effusion.  Assessment and Plan:  1.  Chronic diastolic heart failure.  Weight is down and she feels better at follow-up today.  I reviewed her most recent hospital stay.  Plan to continue Lasix at 40 mg twice daily.  We did discuss taking an extra dose of Lasix if she gains 2 to 3 pounds in 24 hours or 5 pounds in a week.  Recent follow-up echocardiogram shows LVEF 65 to 70% range.  2.  Aortic valve disease status post St. Jude mechanical AVR.  Prosthetic function normal by recent echocardiogram with only trivial aortic regurgitation.  She continues on Coumadin with follow-up in the anticoagulation clinic.  3.  CAD status post CABG in 2007.  She reports no active angina symptoms.  4.  Permanent atrial fibrillation, no active palpitations.  Heart rate control is adequate on Toprol-XL and Cardizem CD.  Current medicines were reviewed with the patient today.  Disposition: Follow-up in 4 months.  Signed, Satira Sark, MD, [facility] 06/08/2018 11:28 AM    [facility] [place], [place], [place] [address] Phone: [phone]; Fax: [phone]

## 2018-06-08 ENCOUNTER — Ambulatory Visit (INDEPENDENT_AMBULATORY_CARE_PROVIDER_SITE_OTHER): Payer: Medicare HMO | Admitting: Pharmacist

## 2018-06-08 ENCOUNTER — Encounter

## 2018-06-08 ENCOUNTER — Ambulatory Visit: Payer: Medicare HMO | Admitting: Cardiology

## 2018-06-08 ENCOUNTER — Encounter: Payer: Self-pay | Admitting: Cardiology

## 2018-06-08 VITALS — BP 112/82 | HR 76 | Ht 68.0 in | Wt 179.0 lb

## 2018-06-08 DIAGNOSIS — I2510 Atherosclerotic heart disease of native coronary artery without angina pectoris: Secondary | ICD-10-CM | POA: Diagnosis not present

## 2018-06-08 DIAGNOSIS — Z5181 Encounter for therapeutic drug level monitoring: Secondary | ICD-10-CM | POA: Diagnosis not present

## 2018-06-08 DIAGNOSIS — I5032 Chronic diastolic (congestive) heart failure: Secondary | ICD-10-CM | POA: Diagnosis not present

## 2018-06-08 DIAGNOSIS — I4821 Permanent atrial fibrillation: Secondary | ICD-10-CM

## 2018-06-08 DIAGNOSIS — I4891 Unspecified atrial fibrillation: Secondary | ICD-10-CM | POA: Diagnosis not present

## 2018-06-08 DIAGNOSIS — I482 Chronic atrial fibrillation: Secondary | ICD-10-CM

## 2018-06-08 DIAGNOSIS — Z952 Presence of prosthetic heart valve: Secondary | ICD-10-CM | POA: Diagnosis not present

## 2018-06-08 LAB — POCT INR: INR: 1.4 — AB (ref 2.0–3.0)

## 2018-06-08 NOTE — Patient Instructions (Signed)
Medication Instructions:  Your physician recommends that you continue on your current medications as directed. Please refer to the Current Medication list given to you today.  Labwork: NONE  Testing/Procedures: NONE  Follow-Up: Your physician wants you to follow-up in: 4 MONTHS WITH DR. MCDOWELL. You will receive a reminder letter in the mail two months in advance. If you don't receive a letter, please call our office to schedule the follow-up appointment.  Any Other Special Instructions Will Be Listed Below (If Applicable).  If you need a refill on your cardiac medications before your next appointment, please call your pharmacy. 

## 2018-06-08 NOTE — Patient Instructions (Signed)
Description   Start taking 1 1/2 tablets daily, except 2 tablets each Tuesdays.  Recheck in 1 week.

## 2018-06-14 DIAGNOSIS — I10 Essential (primary) hypertension: Secondary | ICD-10-CM | POA: Diagnosis not present

## 2018-06-14 DIAGNOSIS — I38 Endocarditis, valve unspecified: Secondary | ICD-10-CM | POA: Diagnosis not present

## 2018-06-14 DIAGNOSIS — E039 Hypothyroidism, unspecified: Secondary | ICD-10-CM | POA: Diagnosis not present

## 2018-06-14 DIAGNOSIS — I5042 Chronic combined systolic (congestive) and diastolic (congestive) heart failure: Secondary | ICD-10-CM | POA: Diagnosis not present

## 2018-06-14 DIAGNOSIS — E559 Vitamin D deficiency, unspecified: Secondary | ICD-10-CM | POA: Diagnosis not present

## 2018-06-14 DIAGNOSIS — M5136 Other intervertebral disc degeneration, lumbar region: Secondary | ICD-10-CM | POA: Diagnosis not present

## 2018-06-14 DIAGNOSIS — J45909 Unspecified asthma, uncomplicated: Secondary | ICD-10-CM | POA: Diagnosis not present

## 2018-06-14 DIAGNOSIS — G2581 Restless legs syndrome: Secondary | ICD-10-CM | POA: Diagnosis not present

## 2018-06-15 ENCOUNTER — Ambulatory Visit: Payer: Medicare HMO | Admitting: *Deleted

## 2018-06-15 DIAGNOSIS — I4891 Unspecified atrial fibrillation: Secondary | ICD-10-CM

## 2018-06-15 DIAGNOSIS — Z952 Presence of prosthetic heart valve: Secondary | ICD-10-CM

## 2018-06-15 DIAGNOSIS — Z5181 Encounter for therapeutic drug level monitoring: Secondary | ICD-10-CM

## 2018-06-15 LAB — POCT INR: INR: 2 (ref 2.0–3.0)

## 2018-06-15 NOTE — Patient Instructions (Signed)
Continue coumadin 1 1/2 tablet daily except 2 tablets on Tuesdays Recheck in 2 weeks.

## 2018-06-19 ENCOUNTER — Other Ambulatory Visit: Payer: Self-pay

## 2018-06-19 ENCOUNTER — Encounter (HOSPITAL_COMMUNITY): Payer: Self-pay | Admitting: Emergency Medicine

## 2018-06-19 ENCOUNTER — Inpatient Hospital Stay (HOSPITAL_COMMUNITY)
Admission: EM | Admit: 2018-06-19 | Discharge: 2018-06-22 | DRG: 291 | Disposition: A | Discharge status: Home / Self Care | Payer: Medicare HMO | Attending: Internal Medicine | Admitting: Internal Medicine

## 2018-06-19 ENCOUNTER — Emergency Department (HOSPITAL_COMMUNITY): Payer: Medicare HMO

## 2018-06-19 DIAGNOSIS — Z9981 Dependence on supplemental oxygen: Secondary | ICD-10-CM | POA: Diagnosis not present

## 2018-06-19 DIAGNOSIS — J9601 Acute respiratory failure with hypoxia: Secondary | ICD-10-CM | POA: Insufficient documentation

## 2018-06-19 DIAGNOSIS — J441 Chronic obstructive pulmonary disease with (acute) exacerbation: Secondary | ICD-10-CM | POA: Diagnosis present

## 2018-06-19 DIAGNOSIS — G4730 Sleep apnea, unspecified: Secondary | ICD-10-CM | POA: Diagnosis present

## 2018-06-19 DIAGNOSIS — Z951 Presence of aortocoronary bypass graft: Secondary | ICD-10-CM | POA: Diagnosis not present

## 2018-06-19 DIAGNOSIS — Z79890 Hormone replacement therapy (postmenopausal): Secondary | ICD-10-CM | POA: Diagnosis not present

## 2018-06-19 DIAGNOSIS — Z90710 Acquired absence of both cervix and uterus: Secondary | ICD-10-CM | POA: Diagnosis not present

## 2018-06-19 DIAGNOSIS — I5033 Acute on chronic diastolic (congestive) heart failure: Principal | ICD-10-CM | POA: Diagnosis present

## 2018-06-19 DIAGNOSIS — J9622 Acute and chronic respiratory failure with hypercapnia: Secondary | ICD-10-CM | POA: Diagnosis present

## 2018-06-19 DIAGNOSIS — N183 Chronic kidney disease, stage 3 unspecified: Secondary | ICD-10-CM | POA: Diagnosis present

## 2018-06-19 DIAGNOSIS — R0902 Hypoxemia: Secondary | ICD-10-CM

## 2018-06-19 DIAGNOSIS — E876 Hypokalemia: Secondary | ICD-10-CM | POA: Diagnosis present

## 2018-06-19 DIAGNOSIS — Z79899 Other long term (current) drug therapy: Secondary | ICD-10-CM | POA: Diagnosis not present

## 2018-06-19 DIAGNOSIS — R778 Other specified abnormalities of plasma proteins: Secondary | ICD-10-CM | POA: Diagnosis present

## 2018-06-19 DIAGNOSIS — G2581 Restless legs syndrome: Secondary | ICD-10-CM | POA: Diagnosis present

## 2018-06-19 DIAGNOSIS — E785 Hyperlipidemia, unspecified: Secondary | ICD-10-CM | POA: Diagnosis present

## 2018-06-19 DIAGNOSIS — K219 Gastro-esophageal reflux disease without esophagitis: Secondary | ICD-10-CM | POA: Diagnosis present

## 2018-06-19 DIAGNOSIS — R748 Abnormal levels of other serum enzymes: Secondary | ICD-10-CM

## 2018-06-19 DIAGNOSIS — J449 Chronic obstructive pulmonary disease, unspecified: Secondary | ICD-10-CM | POA: Diagnosis not present

## 2018-06-19 DIAGNOSIS — R600 Localized edema: Secondary | ICD-10-CM | POA: Diagnosis not present

## 2018-06-19 DIAGNOSIS — Z952 Presence of prosthetic heart valve: Secondary | ICD-10-CM

## 2018-06-19 DIAGNOSIS — R7989 Other specified abnormal findings of blood chemistry: Secondary | ICD-10-CM | POA: Diagnosis present

## 2018-06-19 DIAGNOSIS — R0602 Shortness of breath: Secondary | ICD-10-CM | POA: Diagnosis present

## 2018-06-19 DIAGNOSIS — M79606 Pain in leg, unspecified: Secondary | ICD-10-CM

## 2018-06-19 DIAGNOSIS — J9621 Acute and chronic respiratory failure with hypoxia: Secondary | ICD-10-CM | POA: Diagnosis not present

## 2018-06-19 DIAGNOSIS — Z7901 Long term (current) use of anticoagulants: Secondary | ICD-10-CM | POA: Diagnosis not present

## 2018-06-19 DIAGNOSIS — I4891 Unspecified atrial fibrillation: Secondary | ICD-10-CM | POA: Diagnosis not present

## 2018-06-19 DIAGNOSIS — I482 Chronic atrial fibrillation: Secondary | ICD-10-CM | POA: Diagnosis present

## 2018-06-19 DIAGNOSIS — Z87891 Personal history of nicotine dependence: Secondary | ICD-10-CM

## 2018-06-19 DIAGNOSIS — I2510 Atherosclerotic heart disease of native coronary artery without angina pectoris: Secondary | ICD-10-CM | POA: Diagnosis present

## 2018-06-19 DIAGNOSIS — Z7951 Long term (current) use of inhaled steroids: Secondary | ICD-10-CM | POA: Diagnosis not present

## 2018-06-19 DIAGNOSIS — E039 Hypothyroidism, unspecified: Secondary | ICD-10-CM | POA: Diagnosis not present

## 2018-06-19 LAB — CBC
HCT: 31.7 % — ABNORMAL LOW (ref 36.0–46.0)
HEMOGLOBIN: 9.8 g/dL — AB (ref 12.0–15.0)
MCH: 26.6 pg (ref 26.0–34.0)
MCHC: 30.9 g/dL (ref 30.0–36.0)
MCV: 85.9 fL (ref 78.0–100.0)
PLATELETS: 242 10*3/uL (ref 150–400)
RBC: 3.69 MIL/uL — AB (ref 3.87–5.11)
RDW: 16.7 % — ABNORMAL HIGH (ref 11.5–15.5)
WBC: 9.6 10*3/uL (ref 4.0–10.5)

## 2018-06-19 LAB — BASIC METABOLIC PANEL
ANION GAP: 12 (ref 5–15)
BUN: 32 mg/dL — ABNORMAL HIGH (ref 8–23)
CALCIUM: 8.8 mg/dL — AB (ref 8.9–10.3)
CHLORIDE: 95 mmol/L — AB (ref 98–111)
CO2: 27 mmol/L (ref 22–32)
CREATININE: 1.57 mg/dL — AB (ref 0.44–1.00)
GFR calc non Af Amer: 33 mL/min — ABNORMAL LOW (ref >60)
GFR, EST AFRICAN AMERICAN: 39 mL/min — AB (ref >60)
Glucose, Bld: 81 mg/dL (ref 70–99)
Potassium: 3.4 mmol/L — ABNORMAL LOW (ref 3.5–5.1)
SODIUM: 134 mmol/L — AB (ref 135–145)

## 2018-06-19 LAB — TROPONIN I: TROPONIN I: 0.03 ng/mL — AB (ref <0.03)

## 2018-06-19 LAB — PROTIME-INR
INR: 2.05
Prothrombin Time: 23 seconds — ABNORMAL HIGH (ref 11.4–15.2)

## 2018-06-19 LAB — BRAIN NATRIURETIC PEPTIDE: B Natriuretic Peptide: 610 pg/mL — ABNORMAL HIGH (ref 0.0–100.0)

## 2018-06-19 MED ORDER — DILTIAZEM HCL ER COATED BEADS 120 MG PO CP24
120.0000 mg | ORAL_CAPSULE | Freq: Every day | ORAL | Status: DC
  Administered 2018-06-19 – 2018-06-22 (×4): 120 mg via ORAL
  Filled 2018-06-19 (×4): qty 1

## 2018-06-19 MED ORDER — LORATADINE 10 MG PO TABS
10.0000 mg | ORAL_TABLET | Freq: Every day | ORAL | Status: DC
  Administered 2018-06-19 – 2018-06-22 (×4): 10 mg via ORAL
  Filled 2018-06-19 (×4): qty 1

## 2018-06-19 MED ORDER — ONDANSETRON HCL 4 MG/2ML IJ SOLN: 4.0000 mg | Freq: Four times a day (QID) | INTRAMUSCULAR | Status: DC | PRN

## 2018-06-19 MED ORDER — PANTOPRAZOLE SODIUM 40 MG PO TBEC
40.0000 mg | DELAYED_RELEASE_TABLET | Freq: Every day | ORAL | Status: DC
  Administered 2018-06-19 – 2018-06-22 (×4): 40 mg via ORAL
  Filled 2018-06-19 (×4): qty 1

## 2018-06-19 MED ORDER — WARFARIN SODIUM 2 MG PO TABS
3.0000 mg | ORAL_TABLET | Freq: Once | ORAL | Status: AC
  Administered 2018-06-19: 3 mg via ORAL
  Filled 2018-06-19: qty 1

## 2018-06-19 MED ORDER — ROSUVASTATIN CALCIUM 10 MG PO TABS
10.0000 mg | ORAL_TABLET | Freq: Every day | ORAL | Status: DC
  Administered 2018-06-20 – 2018-06-21 (×2): 10 mg via ORAL
  Filled 2018-06-19 (×3): qty 1

## 2018-06-19 MED ORDER — SODIUM CHLORIDE 0.9% FLUSH: 3.0000 mL | INTRAVENOUS | Status: DC | PRN

## 2018-06-19 MED ORDER — METHYLPREDNISOLONE SODIUM SUCC 125 MG IJ SOLR
60.0000 mg | Freq: Four times a day (QID) | INTRAMUSCULAR | Status: DC
  Administered 2018-06-19 – 2018-06-21 (×8): 60 mg via INTRAVENOUS
  Filled 2018-06-19 (×7): qty 2

## 2018-06-19 MED ORDER — GUAIFENESIN-DM 100-10 MG/5ML PO SYRP: 5.0000 mL | ORAL_SOLUTION | ORAL | Status: DC | PRN

## 2018-06-19 MED ORDER — BUDESONIDE 0.5 MG/2ML IN SUSP
0.5000 mg | Freq: Two times a day (BID) | RESPIRATORY_TRACT | Status: DC
  Administered 2018-06-19 – 2018-06-22 (×6): 0.5 mg via RESPIRATORY_TRACT
  Filled 2018-06-19 (×7): qty 2

## 2018-06-19 MED ORDER — LEVOTHYROXINE SODIUM 50 MCG PO TABS
50.0000 ug | ORAL_TABLET | Freq: Every day | ORAL | Status: DC
  Administered 2018-06-20 – 2018-06-22 (×3): 50 ug via ORAL
  Filled 2018-06-19 (×3): qty 1

## 2018-06-19 MED ORDER — BENZONATATE 100 MG PO CAPS
200.0000 mg | ORAL_CAPSULE | Freq: Three times a day (TID) | ORAL | Status: DC | PRN
  Administered 2018-06-20: 200 mg via ORAL
  Filled 2018-06-19: qty 2

## 2018-06-19 MED ORDER — ROPINIROLE HCL 1 MG PO TABS
3.0000 mg | ORAL_TABLET | Freq: Two times a day (BID) | ORAL | Status: DC
  Administered 2018-06-20 – 2018-06-22 (×5): 3 mg via ORAL
  Filled 2018-06-19 (×6): qty 3

## 2018-06-19 MED ORDER — SODIUM CHLORIDE 0.9% FLUSH
3.0000 mL | Freq: Two times a day (BID) | INTRAVENOUS | Status: DC
  Administered 2018-06-20 – 2018-06-21 (×4): 3 mL via INTRAVENOUS

## 2018-06-19 MED ORDER — FUROSEMIDE 10 MG/ML IJ SOLN
40.0000 mg | Freq: Once | INTRAMUSCULAR | Status: AC
  Administered 2018-06-19: 40 mg via INTRAVENOUS
  Filled 2018-06-19: qty 4

## 2018-06-19 MED ORDER — ALLOPURINOL 100 MG PO TABS
100.0000 mg | ORAL_TABLET | Freq: Every day | ORAL | Status: DC
  Administered 2018-06-19 – 2018-06-22 (×4): 100 mg via ORAL
  Filled 2018-06-19 (×4): qty 1

## 2018-06-19 MED ORDER — POTASSIUM CHLORIDE 20 MEQ PO PACK
20.0000 meq | PACK | Freq: Every day | ORAL | Status: DC
  Administered 2018-06-20 – 2018-06-22 (×3): 20 meq via ORAL
  Filled 2018-06-19 (×3): qty 1

## 2018-06-19 MED ORDER — IPRATROPIUM-ALBUTEROL 0.5-2.5 (3) MG/3ML IN SOLN
3.0000 mL | RESPIRATORY_TRACT | Status: AC
  Administered 2018-06-19 (×3): 3 mL via RESPIRATORY_TRACT
  Filled 2018-06-19: qty 6
  Filled 2018-06-19: qty 3

## 2018-06-19 MED ORDER — WARFARIN - PHARMACIST DOSING INPATIENT
Freq: Every day | Status: DC
  Administered 2018-06-21: 17:00:00

## 2018-06-19 MED ORDER — CALCIUM CARBONATE 1250 (500 CA) MG PO TABS
1250.0000 mg | ORAL_TABLET | Freq: Two times a day (BID) | ORAL | Status: DC
  Administered 2018-06-20 – 2018-06-22 (×5): 1250 mg via ORAL
  Filled 2018-06-19 (×6): qty 1

## 2018-06-19 MED ORDER — ALPRAZOLAM 0.25 MG PO TABS
0.2500 mg | ORAL_TABLET | Freq: Two times a day (BID) | ORAL | Status: DC | PRN
  Administered 2018-06-20 – 2018-06-21 (×2): 0.25 mg via ORAL
  Filled 2018-06-19 (×3): qty 1

## 2018-06-19 MED ORDER — BISOPROLOL FUMARATE 5 MG PO TABS
5.0000 mg | ORAL_TABLET | Freq: Every day | ORAL | Status: DC
  Administered 2018-06-20 – 2018-06-22 (×3): 5 mg via ORAL
  Filled 2018-06-19 (×3): qty 1

## 2018-06-19 MED ORDER — IPRATROPIUM-ALBUTEROL 0.5-2.5 (3) MG/3ML IN SOLN
3.0000 mL | Freq: Four times a day (QID) | RESPIRATORY_TRACT | Status: DC
  Administered 2018-06-19 – 2018-06-21 (×8): 3 mL via RESPIRATORY_TRACT
  Filled 2018-06-19 (×9): qty 3

## 2018-06-19 MED ORDER — SODIUM CHLORIDE 0.9 % IV SOLN: 250.0000 mL | INTRAVENOUS | Status: DC | PRN

## 2018-06-19 MED ORDER — PREDNISONE 50 MG PO TABS
60.0000 mg | ORAL_TABLET | Freq: Once | ORAL | Status: AC
  Administered 2018-06-19: 60 mg via ORAL
  Filled 2018-06-19: qty 1

## 2018-06-19 MED ORDER — POTASSIUM CHLORIDE CRYS ER 20 MEQ PO TBCR
20.0000 meq | EXTENDED_RELEASE_TABLET | Freq: Once | ORAL | Status: AC
  Administered 2018-06-19: 20 meq via ORAL
  Filled 2018-06-19: qty 1

## 2018-06-19 MED ORDER — FUROSEMIDE 10 MG/ML IJ SOLN
40.0000 mg | Freq: Two times a day (BID) | INTRAMUSCULAR | Status: DC
  Filled 2018-06-19: qty 4

## 2018-06-19 MED ORDER — ADULT MULTIVITAMIN W/MINERALS CH
1.0000 | ORAL_TABLET | Freq: Every day | ORAL | Status: DC
  Administered 2018-06-20 – 2018-06-22 (×3): 1 via ORAL
  Filled 2018-06-19 (×3): qty 1

## 2018-06-19 MED ORDER — GUAIFENESIN-DM 100-10 MG/5ML PO SYRP
5.0000 mL | ORAL_SOLUTION | ORAL | Status: DC | PRN
  Administered 2018-06-20: 5 mL via ORAL
  Filled 2018-06-19 (×2): qty 5

## 2018-06-19 MED ORDER — ACETAMINOPHEN 325 MG PO TABS
650.0000 mg | ORAL_TABLET | ORAL | Status: DC | PRN
  Administered 2018-06-19 – 2018-06-21 (×3): 650 mg via ORAL
  Filled 2018-06-19 (×3): qty 2

## 2018-06-19 MED ORDER — METHYLPREDNISOLONE SODIUM SUCC 125 MG IJ SOLR
125.0000 mg | Freq: Once | INTRAMUSCULAR | Status: AC
  Administered 2018-06-19: 125 mg via INTRAVENOUS
  Filled 2018-06-19: qty 2

## 2018-06-19 NOTE — ED Triage Notes (Addendum)
Pt c/o worsening SOB that began yesterday. Hx of CHF. SOB worsens with exertion. Pt began extremely SOB while walking down hallway. Audible wheezing noted. Pt wears 2L supplemental oxygen while sleeping.

## 2018-06-19 NOTE — ED Notes (Signed)
Date and time results received: 06/19/18 2:48 PM  (use smartphrase ".now" to insert current time)  Test: Troponin Critical Value: 0.03  Name of Provider Notified: [place]  Orders Received? Or Actions Taken?: Orders Received - See Orders for details

## 2018-06-19 NOTE — ED Notes (Signed)
resp at bedside

## 2018-06-19 NOTE — ED Provider Notes (Signed)
Emergency Department Provider Note   I have reviewed the triage vital signs and the nursing notes.   HISTORY  Chief Complaint Shortness of Breath   HPI [name] is a 67 y.o. female with multiple medical problems documented below the presents the emergency department today secondary to shortness of breath.  Sounds like she has been progressively worsened over the last couple days.  But last night she had significant coughing and was unable to sleep because she was coughing so much.  She is coughing stuff is productive of green sputum.  She had chills but no measured temperatures.  No abdominal pain or back pain.  No chest pain.  She does state that her lower extremity swelling is worse than normal but is symmetric.  No recent sick contacts.  Was recently in the hospital about a month ago for CHF exacerbation and on review of records it appears that that is normally what the cause of her symptoms are.  States that she is been compliant with medications and diet plans. No other associated or modifying symptoms.    Past Medical History:  Diagnosis Date  . Acute on chronic diastolic (congestive) heart failure ([place])   . Anxiety disorder   . Aortic stenosis    Status post St. Jude mechanical AVR 2007  . Asthma   . Atrial fibrillation ([place])   . Carcinoid tumor of colon 2007  . Coronary atherosclerosis of native coronary artery    Status post CABG 2007  . GERD (gastroesophageal reflux disease)   . History of colonoscopy 2003   Dr. Gala Romney - normal  . Hyperlipidemia   . Macromastia   . Pedal edema    Bilateral, chronic  . Peptic stricture of esophagus 10/04/2010   GE junction on last EGD by Dr. Gala Romney, benign biopsies  . RLS (restless legs syndrome)   . Schatzki's ring     Patient Active Problem List   Diagnosis Date Noted  . Supratherapeutic INR 05/03/2018  . CHF exacerbation ([place]) 05/02/2018  . Acute metabolic encephalopathy 67/61/9509  . CKD (chronic kidney disease)  stage 3, GFR 30-59 ml/min (HCC) 05/02/2018  . Hypothyroidism 05/02/2018  . Generalized weakness 05/02/2018  . Physical deconditioning   . Coronary artery disease of bypass graft of native heart with stable angina pectoris ([place])   . Acute renal failure superimposed on stage 3 chronic kidney disease ([place])   . Acute on chronic respiratory failure with hypercapnia ([place]) 03/24/2018  . Hypomagnesemia 03/24/2018  . Hypokalemia 03/24/2018  . Hypophosphatemia 03/24/2018  . Atrial fibrillation with RVR ([place]) 03/24/2018  . Elevated troponin 03/24/2018  . Coughing 01/14/2018  . Anemia 01/14/2018  . Hx of adenomatous colonic polyps 01/14/2018  . Acute on chronic diastolic congestive heart failure ([place]) 12/17/2016  . COPD exacerbation ([place]) 12/17/2016  . Bilateral leg edema 07/05/2014  . Encounter for therapeutic drug monitoring 11/15/2013  . Chronic venous insufficiency 08/26/2011  . Chronic anticoagulation 01/11/2011  . GERD 09/16/2010  . DYSPHAGIA 09/16/2010  . Aortic valve replaced 06/05/2009  . Hyperlipidemia 06/04/2009  . Coronary atherosclerosis of native coronary artery 06/04/2009  . Atrial fibrillation ([place]) 06/04/2009    Past Surgical History:  Procedure Laterality Date  . ABDOMINAL HYSTERECTOMY    . AORTIC VALVE REPLACEMENT  2007   #25 mm St. 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 APPENDECTOMY  2007  . BACK SURGERY     lumbar 4 and 5   . BIOPSY  02/05/2012   RMR:Two tongues of salmon-colored epithelium distal esophagus, very short-segment Barrett's s/p bx/Small hiatal hernia, otherwise normal stomach, D1, D2. Status post esophageal dilation. Biopsy showed GERD.  . Breast cyst removed     bilateral  . BREAST REDUCTION SURGERY    . CESAREAN SECTION    . 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 COLONOSCOPY  02/05/2012   GEX:BMWUXL rectum, sigmoid diverticulosis,descending colon polyp , tubular adenoma  .  CORONARY ARTERY BYPASS GRAFT     06/2006 - RIMA to RCA, SVG to RCA  . ESOPHAGOGASTRODUODENOSCOPY  10/03/2010   Dr. Gala Romney- schatzki's ring, shoft peptic stricture at GE junction.  . ESOPHAGOGASTRODUODENOSCOPY (EGD) WITH PROPOFOL N/A 02/08/2018   Procedure: ESOPHAGOGASTRODUODENOSCOPY (EGD) WITH PROPOFOL;  Surgeon: Daneil Dolin, MD;  Location: [facility];  Service: Endoscopy;  Laterality: N/A;  8:15am  . FOOT SURGERY     bilateral bunionectomy  . HERNIA REPAIR     with mesh  . LAPAROTOMY  2007   small bowel resection secondary to small bowel obstruction  . MALONEY DILATION  02/05/2012   Procedure: Venia Minks DILATION;  Surgeon: Daneil Dolin, MD;  Location: [facility];  Service: Endoscopy;  Laterality: N/A;  73mm   . MALONEY DILATION N/A 02/08/2018   Procedure: Venia Minks DILATION;  Surgeon: Daneil Dolin, MD;  Location: [facility];  Service: Endoscopy;  Laterality: N/A;  . Teeth removal      Current Outpatient Rx  . Order #: [id number] Class: Historical Med  . Order #: [id number] Class: Historical Med  . Order #: [id number] Class: Normal  . Order #: [id number] Class: Historical Med  . Order #: [id number] Class: Historical Med  . Order #: [id number] Class: Print  . Order #: [id number] Class: Print  . Order #: [id number] Class: Historical Med  . Order #: [id number] Class: Historical Med  . Order #: [id number] Class: Historical Med  . Order #: [id number] Class: Print  . Order #: [id number] Class: Print  . Order #: [id number] Class: Historical Med  . Order #: [id number] Class: Historical Med  . Order #: [id number] Class: Historical Med  . Order #: [id number] Class: Historical Med  . Order #: [id number] Class: Historical Med  . Order #: [id number] Class: Historical Med  . Order #: [id number] Class: No Print    Allergies Penicillins  Family History  Problem Relation Age of Onset  . Stroke Mother   . Cancer Father   . Anesthesia problems Neg Hx   . Hypotension Neg Hx   . Malignant hyperthermia Neg Hx   . Pseudochol  deficiency Neg Hx     Social History Social History   Tobacco Use  . Smoking status: Former Smoker    Packs/day: 1.00    Years: 20.00    Pack years: 20.00    Types: Cigarettes    Last attempt to quit: 10/20/1988    Years since quitting: 29.6  . Smokeless tobacco: Never Used  Substance Use Topics  . Alcohol use: No    Alcohol/week: 0.0 standard drinks  . Drug use: No    Review of Systems  All other systems negative except as documented in the HPI. All pertinent positives and negatives as reviewed in the HPI. ____________________________________________   PHYSICAL EXAM:  VITAL SIGNS: ED Triage Vitals  Enc Vitals Group     BP 06/19/18 1400 99/86     Pulse Rate 06/19/18 1400 96     Resp 06/19/18 1400 (!) 24     Temp 06/19/18 1400 98.8 F (37.1 C)     Temp Source  06/19/18 1400 Oral     SpO2 06/19/18 1400 91 %     Weight 06/19/18 1356 190 lb (86.2 kg)     Height 06/19/18 1356 5\' 8"  (1.727 m)    Constitutional: Alert and oriented. Well appearing and in mild respiratory distress. Eyes: Conjunctivae are normal. PERRL. EOMI. Head: Atraumatic. Nose: No congestion/rhinnorhea. Mouth/Throat: Mucous membranes are moist.  Oropharynx non-erythematous. Neck: No stridor.  No meningeal signs.   Cardiovascular: tachycardic rate, regular rhythm. Good peripheral circulation. Grossly normal heart sounds.   Respiratory: tachypneic respiratory effort.  No retractions. Lungs diminished with wheezing (somewhat difficult to auscultate 2/2 coughing). Gastrointestinal: Soft and nontender. No distention.  Musculoskeletal: No lower extremity tenderness 1+ edema to knees. No gross deformities of extremities. Neurologic:  Normal speech and language. No gross focal neurologic deficits are appreciated.  Skin:  Skin is warm, dry and intact. No rash noted.   ____________________________________________   LABS (all labs ordered are listed, but only abnormal results are displayed)  Labs Reviewed    BASIC METABOLIC PANEL - Abnormal; Notable for the following components:      Result Value   Sodium 134 (*)    Potassium 3.4 (*)    Chloride 95 (*)    BUN 32 (*)    Creatinine, Ser 1.57 (*)    Calcium 8.8 (*)    GFR calc non Af Amer 33 (*)    GFR calc Af Amer 39 (*)    All other components within normal limits  CBC - Abnormal; Notable for the following components:   RBC 3.69 (*)    Hemoglobin 9.8 (*)    HCT 31.7 (*)    RDW 16.7 (*)    All other components within normal limits  TROPONIN I - Abnormal; Notable for the following components:   Troponin I 0.03 (*)    All other components within normal limits  BRAIN NATRIURETIC PEPTIDE - Abnormal; Notable for the following components:   B Natriuretic Peptide 610.0 (*)    All other components within normal limits   ____________________________________________  EKG   EKG Interpretation  Date/Time:  Saturday June 19 2018 14:04:49 EDT Ventricular Rate:  88 PR Interval:    QRS Duration: 103 QT Interval:  337 QTC Calculation: 408 R Axis:   45 Text Interpretation:  Atrial fibrillation Ventricular premature complex Low voltage, precordial leads RSR' in V1 or V2, probably normal variant Borderline repolarization abnormality Baseline wander in lead(s) V1 V2 No significant change since last tracing Confirmed by Merrily Pew [phone]) on 06/19/2018 2:26:55 PM       ____________________________________________  RADIOLOGY  Dg Chest Port 1 View  Result Date: 06/19/2018 CLINICAL DATA:  Shortness of breath.  COPD. EXAM: PORTABLE CHEST 1 VIEW COMPARISON:  PA and lateral chest 05/02/2018 and 11/04/2017. FINDINGS: There is cardiomegaly without edema. The patient is status post aortic valve replacement. No pneumothorax or pleural effusion. No acute or focal bony abnormality. IMPRESSION: Cardiomegaly without acute disease. Electronically Signed   By: Inge Rise M.D.   On: 06/19/2018 14:42     ____________________________________________   PROCEDURES  Procedure(s) performed:   Procedures  CRITICAL CARE Performed by: Merrily Pew Total critical care time: 35 minutes Critical care time was exclusive of separately billable procedures and treating other patients. Critical care was necessary to treat or prevent imminent or life-threatening deterioration. Critical care was time spent personally by me on the following activities: development of treatment plan with patient and/or surrogate as well as nursing, discussions with consultants,  evaluation of patient's response to treatment, examination of patient, obtaining history from patient or surrogate, ordering and performing treatments and interventions, ordering and review of laboratory studies, ordering and review of radiographic studies, pulse oximetry and re-evaluation of patient's condition.  ____________________________________________   INITIAL IMPRESSION / ASSESSMENT AND PLAN / ED COURSE  COPD vs CHF vs pneumonia. Will start with cxr and breathing treatmetns and then treat appropriately. Requiring 2L oxygen to keep sats above 90. Couldn't walk down hallway without a wheelchair 2/2 dyspnea (also appeared gray during that effort) initially so suspect she may need admission, but will reeval for disposition.  Significant improvement in her breathing but still tachypneic with some accessory muscle usage.  At this point her BNP is also elevated suspect there is a component of CHF to it so we will give her Lasix as well.  Discussed with hospitalist for admission.   Pertinent labs & imaging results that were available during my care of the patient were reviewed by me and considered in my medical decision making (see chart for details).  ____________________________________________  FINAL CLINICAL IMPRESSION(S) / ED DIAGNOSES  Final diagnoses:  Hypoxia  Acute respiratory failure with hypoxia (HCC)     MEDICATIONS GIVEN  DURING THIS VISIT:  Medications  ipratropium-albuterol (DUONEB) 0.5-2.5 (3) MG/3ML nebulizer solution 3 mL (3 mLs Nebulization Given 06/19/18 1433)  predniSONE (DELTASONE) tablet 60 mg (60 mg Oral Given 06/19/18 1419)  furosemide (LASIX) injection 40 mg (40 mg Intravenous Given 06/19/18 1516)     NEW OUTPATIENT MEDICATIONS STARTED DURING THIS VISIT:  New Prescriptions   No medications on file    Note:  This note was prepared with assistance of Dragon voice recognition software. Occasional wrong-word or sound-a-like substitutions may have occurred due to the inherent limitations of voice recognition software.   Merrily Pew, MD 06/19/18 1534

## 2018-06-19 NOTE — Progress Notes (Signed)
ANTICOAGULATION CONSULT NOTE - Initial Consult  Pharmacy Consult for warfarin Indication: aortic valve replacement   Allergies  Allergen Reactions  . Penicillins Hives, Itching, Swelling and Rash    Has patient had a PCN reaction causing immediate rash, facial/tongue/throat swelling, SOB or lightheadedness with hypotension: Yes Has patient had a PCN reaction causing severe rash involving mucus membranes or skin necrosis: Yes Has patient had a PCN reaction that required hospitalization: unknown Has patient had a PCN reaction occurring within the last 10 years: No If all of the above answers are "NO", then may proceed with Cephalosporin use.    Throat swelling    Patient Measurements: Height: 5\' 8"  (172.7 cm) Weight: 196 lb 3.4 oz (89 kg) IBW/kg (Calculated) : 63.9   Vital Signs: Temp: 98.3 F (36.8 C) (08/31 1706) Temp Source: Oral (08/31 1706) BP: 111/72 (08/31 1706) Pulse Rate: 93 (08/31 1706)  Labs: Recent Labs    06/19/18 1402  HGB 9.8*  HCT 31.7*  PLT 242  LABPROT 23.0*  INR 2.05  CREATININE 1.57*  TROPONINI 0.03*    Estimated Creatinine Clearance: 41.1 mL/min (A) (by C-G formula based on SCr of 1.57 mg/dL (H)).   Medical History: Past Medical History:  Diagnosis Date  . Acute on chronic diastolic (congestive) heart failure ([place])   . Anxiety disorder   . Aortic stenosis    Status post St. Jude mechanical AVR 2007  . Asthma   . Atrial fibrillation ([place])   . Carcinoid tumor of colon 2007  . Coronary atherosclerosis of native coronary artery    Status post CABG 2007  . GERD (gastroesophageal reflux disease)   . History of colonoscopy 2003   Dr. Gala Romney - normal  . Hyperlipidemia   . Macromastia   . Pedal edema    Bilateral, chronic  . Peptic stricture of esophagus 10/04/2010   GE junction on last EGD by Dr. Gala Romney, benign biopsies  . RLS (restless legs syndrome)   . Schatzki's ring     Medications:  Medications Prior to Admission  Medication  Sig Dispense Refill Last Dose  . albuterol (PROVENTIL HFA;VENTOLIN HFA) 108 (90 BASE) MCG/ACT inhaler Inhale 1-2 puffs into the lungs every 6 (six) hours as needed for wheezing or shortness of breath.    Taking  . allopurinol (ZYLOPRIM) 100 MG tablet Take 100 mg by mouth daily.   Taking  . ALPRAZolam (XANAX) 0.25 MG tablet Take 1 tablet (0.25 mg total) by mouth 2 (two) times daily as needed for anxiety. (Patient taking differently: Take 1 mg by mouth 2 (two) times daily as needed for anxiety. ) 10 tablet 0 Taking  . amoxicillin (AMOXIL) 500 MG capsule Take 2 capsules by mouth 2 (two) times daily.  0   . benzonatate (TESSALON) 200 MG capsule Take 1 capsule by mouth 3 (three) times daily as needed for cough.   1   . bisoprolol (ZEBETA) 5 MG tablet Take 1 tablet (5 mg total) by mouth daily. 30 tablet 3 Taking  . budesonide-formoterol (SYMBICORT) 160-4.5 MCG/ACT inhaler Inhale 2 puffs into the lungs 2 (two) times daily. 1 Inhaler 3 Taking  . calcium carbonate (OS-CAL) 600 MG TABS tablet Take 600 mg by mouth 2 (two) times daily.   Taking  . cetirizine (ZYRTEC) 10 MG tablet Take 10 mg by mouth daily.     Taking  . CRESTOR 10 MG tablet Take 10 mg by mouth at bedtime.    Taking  . diltiazem (CARDIZEM CD) 120 MG 24 hr  capsule Take 1 capsule (120 mg total) by mouth daily. 30 capsule 2 Taking  . furosemide (LASIX) 40 MG tablet Take 1 tablet (40 mg total) by mouth 2 (two) times daily. (Patient taking differently: Take 80 mg by mouth daily. ) 60 tablet 2 Taking  . levothyroxine (SYNTHROID, LEVOTHROID) 50 MCG tablet Take 50 mcg by mouth daily before breakfast.    Taking  . Multiple Vitamin (MULTIVITAMIN WITH MINERALS) TABS tablet Take 1 tablet by mouth daily.   Taking  . omeprazole (PRILOSEC) 20 MG capsule Take 20 mg by mouth daily.   Taking  . OXYGEN Inhale 2 L into the lungs daily.    Taking  . potassium chloride (KLOR-CON) 20 MEQ packet Take 20 mEq by mouth daily.   Taking  . rOPINIRole (REQUIP) 3 MG tablet  Take 3 mg by mouth 2 (two) times daily.    Taking  . warfarin (COUMADIN) 2 MG tablet Takes one tablet (2mg ) everyday (Patient taking differently: Take 2 mg by mouth. Takes one tablet (2mg ) everyday) 45 tablet 3 Taking    Assessment: Pharmacy consulted to dose warfarin in patient with aortic valve replacement and atrial fibrillation. Per anticoagulation visit summary, the patient takes 4 mg every Tuesday and 3 mg ROW.  Patient's INR on admission is therapeutic at 2.05.  Goal of Therapy:  INR 2-3 Monitor platelets by anticoagulation protocol: Yes   Plan:  Warfarin 3 mg x 1 dose Monitor daily INR and s/s of bleeding  Revonda Standard Caelin Rayl 06/19/2018,5:11 PM

## 2018-06-19 NOTE — H&P (Signed)
History and Physical  JORDANA DUGUE MBW:[id number] DOB: [birth year] DOA: 06/19/2018   PCP: Octavio Graves, DO   Patient coming from: Home  Chief Complaint: dyspnea  HPI:  [name] is a 67 y.o. 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  She states that she has had a developing cough in the last 24 hours with green sputum.  She denies any fevers but has subjective subjective chills.  She denies any chest pain, nausea, vomiting, diarrhea, abdominal pain, headache, dysuria, hematuria. She has had increasing lower extremity edema over the past 1 to 2 days prior to admission.  The patient saw cardiology, Dr. Domenic Polite on 06/08/2018 at which time her weight was 179 pounds.  However, the patient states that she weighed herself approximately 2 days prior to this admission, and it was 189.2 pounds.  She endorses compliance with her medications but states that " I drink whatever I want."  She has an approximately 20-pack-year history of tobacco after quitting tobacco 15 years ago.  She intermittently wears oxygen at nighttime at 2 L. In the emergency department, the patient was afebrile hemodynamically stable saturating 94% on 3 L.  BMP showed potassium 3.4 with serum creatinine 1.57.  WBC was unremarkable with hemoglobin at baseline.  Chest x-ray showed cardiomegaly without any acute findings.  BNP was 610.0.  EKG showed atrial fibrillation with nonspecific ST changes.  The patient was given prednisone 60 mg, duo nebs, and Lasix IV 40 mg.  Assessment/Plan: Acute on chronic respiratory failure with hypoxia and hypercarbia -Secondary to CHF and COPD exacerbation -Wean  oxygen back to baseline oxygen -At baseline, patient is on 2 L nasal cannula at night  COPD exacerbation -Start IV Solu-Medrol -Start Pulmicort -Start duo nebs  Acute on chronic diastolic CHF -Patient has gained 10 pounds since her visit with Dr. Domenic Polite on 06/08/2018. -According to Dr. Myles Gip note, the patient is supposed to be taking Lasix 40 mg twice daily; however, the patient has been taking furosemide 80 mg once daily. -Continue IV Lasix 40 mg IV twice daily -Daily weights -Accurate I's and O's -Restart bisoprolol -03/25/2018 echo EF 65-70%, no WMA, PASP 44, normal RV  Permanent atrial fibrillation -Rate controlled -Continue warfarin -Continue diltiazem CD and bisoprolol  CKD stage 3 -baseline creatinine 1.1-1.4 -am BMP  Status post aortic valve replacement -Performed in 2007 -Continue warfarin  Hypothyroidism -Continue Synthroid  Hyperlipidemia -Continue statin  Hypokalemia -replete -check mag         Past Medical History:  Diagnosis Date  . Acute on chronic diastolic (congestive) heart failure ([place])   . Anxiety disorder   . Aortic stenosis    Status post St. Jude mechanical AVR 2007  . Asthma   . Atrial fibrillation ([place])   . Carcinoid tumor of colon 2007  . Coronary atherosclerosis of native coronary artery    Status post CABG 2007  . GERD (gastroesophageal reflux disease)   . History of colonoscopy 2003   Dr. Gala Romney - normal  . Hyperlipidemia   . Macromastia   . Pedal edema    Bilateral, chronic  . Peptic stricture of esophagus 10/04/2010   GE junction on last EGD by Dr. Gala Romney,  benign biopsies  . RLS (restless legs syndrome)   . Schatzki's ring    Past Surgical History:  Procedure Laterality Date  . ABDOMINAL HYSTERECTOMY    . AORTIC VALVE REPLACEMENT  2007   #25 mm St. 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 APPENDECTOMY  2007  . BACK SURGERY     lumbar 4 and 5   . BIOPSY  02/05/2012    RMR:Two tongues of salmon-colored epithelium distal esophagus, very short-segment Barrett's s/p bx/Small hiatal hernia, otherwise normal stomach, D1, D2. Status post esophageal dilation. Biopsy showed GERD.  . Breast cyst removed     bilateral  . BREAST REDUCTION SURGERY    . CESAREAN SECTION    . 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 COLONOSCOPY  02/05/2012   NLZ:JQBHAL rectum, sigmoid diverticulosis,descending colon polyp , tubular adenoma  . CORONARY ARTERY BYPASS GRAFT     06/2006 - RIMA to RCA, SVG to RCA  . ESOPHAGOGASTRODUODENOSCOPY  10/03/2010   Dr. Gala Romney- schatzki's ring, shoft peptic stricture at GE junction.  . ESOPHAGOGASTRODUODENOSCOPY (EGD) WITH PROPOFOL N/A 02/08/2018   Procedure: ESOPHAGOGASTRODUODENOSCOPY (EGD) WITH PROPOFOL;  Surgeon: Daneil Dolin, MD;  Location: [facility];  Service: Endoscopy;  Laterality: N/A;  8:15am  . FOOT SURGERY     bilateral bunionectomy  . HERNIA REPAIR     with mesh  . LAPAROTOMY  2007   small bowel resection secondary to small bowel obstruction  . MALONEY DILATION  02/05/2012   Procedure: Venia Minks DILATION;  Surgeon: Daneil Dolin, MD;  Location: [facility];  Service: Endoscopy;  Laterality: N/A;  8mm   . MALONEY DILATION N/A 02/08/2018   Procedure: Venia Minks DILATION;  Surgeon: Daneil Dolin, MD;  Location: [facility];  Service: Endoscopy;  Laterality: N/A;  . Teeth removal     Social History:  reports that she quit smoking about 29 years ago. Her smoking use included cigarettes. She has a 20.00 pack-year smoking history. She has never used smokeless tobacco. She reports that she does not drink alcohol or use drugs.   Family History  Problem Relation Age of Onset  . Stroke Mother   . Cancer Father   . Anesthesia problems Neg Hx   . Hypotension Neg Hx   . Malignant hyperthermia Neg Hx   . Pseudochol deficiency Neg Hx      Allergies  Allergen Reactions  . Penicillins Hives, Itching, Swelling and Rash      Has patient had a PCN reaction causing immediate rash, facial/tongue/throat swelling, SOB or lightheadedness with hypotension: Yes Has patient had a PCN reaction causing severe rash involving mucus membranes or skin necrosis: Yes Has patient had a PCN reaction that required hospitalization: unknown Has patient had a PCN reaction occurring within the last 10 years: No If all of the above answers are "NO", then may proceed with Cephalosporin use.    Throat swelling     Prior to Admission medications   Medication Sig Start Date End Date Taking? Authorizing Provider  albuterol (PROVENTIL HFA;VENTOLIN HFA) 108 (90 BASE) MCG/ACT inhaler Inhale 1-2 puffs into the lungs every 6 (six) hours as needed for wheezing or shortness of breath.     [provider]  allopurinol (ZYLOPRIM) 100 MG tablet Take 100 mg by mouth daily.    [provider]  ALPRAZolam Duanne Moron) 0.25 MG tablet Take 1 tablet (0.25 mg total) by mouth 2 (two) times daily as  needed for anxiety. Patient taking differently: Take 1 mg by mouth 2 (two) times daily as needed for anxiety.  05/05/18   Johnson, Clanford L, MD  amoxicillin (AMOXIL) 500 MG capsule Take 2 capsules by mouth 2 (two) times daily. 06/14/18   [provider]  benzonatate (TESSALON) 200 MG capsule Take 1 capsule by mouth 3 (three) times daily as needed for cough.  06/14/18   [provider]  bisoprolol (ZEBETA) 5 MG tablet Take 1 tablet (5 mg total) by mouth daily. 04/01/18   Barton Dubois, MD  budesonide-formoterol [facility]) 160-4.5 MCG/ACT inhaler Inhale 2 puffs into the lungs 2 (two) times daily. 03/31/18   Barton Dubois, MD  calcium carbonate (OS-CAL) 600 MG TABS tablet Take 600 mg by mouth 2 (two) times daily.    [provider]  cetirizine (ZYRTEC) 10 MG tablet Take 10 mg by mouth daily.      [provider]  CRESTOR 10 MG tablet Take 10 mg by mouth at bedtime.  12/02/13   [provider]  diltiazem  (CARDIZEM CD) 120 MG 24 hr capsule Take 1 capsule (120 mg total) by mouth daily. 04/01/18   Barton Dubois, MD  furosemide (LASIX) 40 MG tablet Take 1 tablet (40 mg total) by mouth 2 (two) times daily. Patient taking differently: Take 80 mg by mouth daily.  03/31/18   Barton Dubois, MD  levothyroxine (SYNTHROID, LEVOTHROID) 50 MCG tablet Take 50 mcg by mouth daily before breakfast.     [provider]  Multiple Vitamin (MULTIVITAMIN WITH MINERALS) TABS tablet Take 1 tablet by mouth daily.    [provider]  omeprazole (PRILOSEC) 20 MG capsule Take 20 mg by mouth daily.    [provider]  OXYGEN Inhale 2 L into the lungs daily.     [provider]  potassium chloride (KLOR-CON) 20 MEQ packet Take 20 mEq by mouth daily.    [provider]  rOPINIRole (REQUIP) 3 MG tablet Take 3 mg by mouth 2 (two) times daily.     [provider]  warfarin (COUMADIN) 2 MG tablet Takes one tablet (2mg ) everyday Patient taking differently: Take 2 mg by mouth. Takes one tablet (2mg ) everyday 05/08/18   Murlean Iba, MD    Review of Systems:  Constitutional:  No weight loss, night sweats, Fevers, chills Head&Eyes: No headache.  No vision loss.  No eye pain or scotoma ENT:  No Difficulty swallowing,Tooth/dental problems,Sore throat,  No ear ache, post nasal drip,  Cardio-vascular:  No chest pain, Orthopnea, PND,   dizziness, palpitations  GI:  No  abdominal pain, nausea, vomiting, diarrhea, loss of appetite, hematochezia, melena, heartburn, indigestion, Resp:  No cough. No coughing up of blood .No wheezing.No chest wall deformity  Skin:  no rash or lesions.  GU:  no dysuria, change in color of urine, no urgency or frequency. No flank pain.  Musculoskeletal:  No joint pain or swelling. No decreased range of motion. No back pain.  Psych:  No change in mood or affect. No depression or anxiety. Neurologic: No headache, no dysesthesia, no focal  weakness, no vision loss. No syncope  Physical Exam: Vitals:   06/19/18 1356 06/19/18 1400 06/19/18 1417 06/19/18 1433  BP:  115/87    Pulse:  93    Resp:  (!) 24    Temp:  98.8 F (37.1 C)    TempSrc:  Oral    SpO2:  (!) 76% 94% 94%  Weight: 86.2 kg  Height: 5\' 8"  (1.727 m)      General:  A&O x 3, NAD, nontoxic, pleasant/cooperative Head/Eye: No conjunctival hemorrhage, no icterus, [place]/AT, No nystagmus ENT:  No icterus,  No thrush, good dentition, no pharyngeal exudate Neck:  No masses, no lymphadenpathy, no bruits CV:  IRRR, no rub, no gallop, no S3 Lung: Bibasilar crackles.  Bilateral expiratory wheeze. Abdomen: soft/NT, +BS, nondistended, no peritoneal signs Ext: No cyanosis, No rashes, No petechiae, No lymphangitis, 2 + LE edema Neuro: CNII-XII intact, strength 4/5 in bilateral upper and lower extremities, no dysmetria  Labs on Admission:  Basic Metabolic Panel: Recent Labs  Lab 06/19/18 1402  NA 134*  K 3.4*  CL 95*  CO2 27  GLUCOSE 81  BUN 32*  CREATININE 1.57*  CALCIUM 8.8*   Liver Function Tests: No results for input(s): AST, ALT, ALKPHOS, BILITOT, PROT, ALBUMIN in the last 168 hours. No results for input(s): LIPASE, AMYLASE in the last 168 hours. No results for input(s): AMMONIA in the last 168 hours. CBC: Recent Labs  Lab 06/19/18 1402  WBC 9.6  HGB 9.8*  HCT 31.7*  MCV 85.9  PLT 242   Coagulation Profile: Recent Labs  Lab 06/15/18 1127  INR 2.0   Cardiac Enzymes: Recent Labs  Lab 06/19/18 1402  TROPONINI 0.03*   BNP: Invalid input(s): POCBNP CBG: No results for input(s): GLUCAP in the last 168 hours. Urine analysis: No results found for: COLORURINE, APPEARANCEUR, LABSPEC, PHURINE, GLUCOSEU, HGBUR, BILIRUBINUR, KETONESUR, PROTEINUR, UROBILINOGEN, NITRITE, LEUKOCYTESUR Sepsis Labs: @LABRCNTIP (procalcitonin:4,lacticidven:4) )No results found for this or any previous visit (from the past 240 hour(s)).   Radiological Exams on  Admission: Dg Chest Port 1 View  Result Date: 06/19/2018 CLINICAL DATA:  Shortness of breath.  COPD. EXAM: PORTABLE CHEST 1 VIEW COMPARISON:  PA and lateral chest 05/02/2018 and 11/04/2017. FINDINGS: There is cardiomegaly without edema. The patient is status post aortic valve replacement. No pneumothorax or pleural effusion. No acute or focal bony abnormality. IMPRESSION: Cardiomegaly without acute disease. Electronically Signed   By: Inge Rise M.D.   On: 06/19/2018 14:42    EKG: Independently reviewed. afib with nonspecific ST changes    Time spent:60 minutes Code Status:   FULL Family Communication:  Spouse updated at bedside Disposition Plan: expect 2-3 day hospitalization Consults called: none DVT Prophylaxis: warfarin  Orson Eva, DO  [facility] Pager [phone]  If 7PM-7AM, please contact night-coverage [email or web address] Password [email or web address] 06/19/2018, 3:53 PM

## 2018-06-19 NOTE — ED Notes (Signed)
Report to Finis Bud,, RN

## 2018-06-19 NOTE — ED Notes (Signed)
Pt reports that she is a pt of Dr Melina Copa and that she takes Xanax  Mg for her anxiety could not remember if she took this am so took another this afternoon

## 2018-06-19 NOTE — ED Notes (Signed)
Pt on home O2 2L at night Today increasing SOB, with great anxiety  Orthopneic, shouting and hyperverbal Family at bedside  O2 est at 2L due to anxiety and decreased O2 sat of 86-77 IV, labs, standing orders Spec to lab Resp called Dr Jerilynn Mages in to assess

## 2018-06-19 NOTE — ED Notes (Signed)
Peer wic application  Pt positions of comfort  Spouse at bedside

## 2018-06-20 ENCOUNTER — Inpatient Hospital Stay (HOSPITAL_COMMUNITY): Payer: Medicare HMO

## 2018-06-20 LAB — BASIC METABOLIC PANEL
ANION GAP: 13 (ref 5–15)
BUN: 34 mg/dL — ABNORMAL HIGH (ref 8–23)
CHLORIDE: 96 mmol/L — AB (ref 98–111)
CO2: 27 mmol/L (ref 22–32)
Calcium: 8.5 mg/dL — ABNORMAL LOW (ref 8.9–10.3)
Creatinine, Ser: 1.48 mg/dL — ABNORMAL HIGH (ref 0.44–1.00)
GFR calc non Af Amer: 36 mL/min — ABNORMAL LOW (ref >60)
GFR, EST AFRICAN AMERICAN: 41 mL/min — AB (ref >60)
Glucose, Bld: 238 mg/dL — ABNORMAL HIGH (ref 70–99)
POTASSIUM: 3.3 mmol/L — AB (ref 3.5–5.1)
SODIUM: 136 mmol/L (ref 135–145)

## 2018-06-20 LAB — PROTIME-INR
INR: 2.49
Prothrombin Time: 26.7 seconds — ABNORMAL HIGH (ref 11.4–15.2)

## 2018-06-20 LAB — MAGNESIUM: MAGNESIUM: 2.1 mg/dL (ref 1.7–2.4)

## 2018-06-20 MED ORDER — WARFARIN SODIUM 2 MG PO TABS
2.0000 mg | ORAL_TABLET | Freq: Once | ORAL | Status: AC
  Administered 2018-06-20: 2 mg via ORAL
  Filled 2018-06-20: qty 1

## 2018-06-20 MED ORDER — FUROSEMIDE 10 MG/ML IJ SOLN
60.0000 mg | Freq: Two times a day (BID) | INTRAMUSCULAR | Status: DC
  Administered 2018-06-20 – 2018-06-21 (×3): 60 mg via INTRAVENOUS
  Filled 2018-06-20 (×3): qty 6

## 2018-06-20 NOTE — Progress Notes (Signed)
PROGRESS NOTE  [name] IHK:[id number] DOB: [birth year] DOA: 06/19/2018 PCP: Octavio Graves, DO  Brief History:  67 y.o. 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  She states that she has had a developing cough in the last 24 hours with green sputum.  She has had increasing lower extremity edema over the past 1 to 2 days prior to admission.  The patient saw cardiology, Dr. Domenic Polite on 06/08/2018 at which time her weight was 179 pounds.  However, the patient states that she weighed herself approximately 2 days prior to this admission, and it was 189.2 pounds.  She endorses compliance with her medications but states that " I drink whatever I want."  She intermittently wears oxygen at nighttime at 2 L.  She states that she has been taking lasix 80 mg bid rather than the prescribed 40 mg bid for last 7 days prior to admission.  In the emergency department, the patient was afebrile hemodynamically stable saturating 94% on 3 L.  BMP showed potassium 3.4 with serum creatinine 1.57.  WBC was unremarkable with hemoglobin at baseline.  Chest x-ray showed cardiomegaly without any acute findings.  BNP was 610.0.  EKG showed atrial fibrillation with nonspecific ST changes.  The patient was given prednisone 60 mg, duo nebs, and Lasix IV 40 mg.  She was continued on IV lasix and IV solumedrol  Assessment/Plan: Acute on chronic respiratory failure with hypoxia and hypercarbia -Secondary to CHF and COPD exacerbation -Wean oxygen back to baseline oxygen -At baseline, patient is on 2 L nasal cannula at night -presently on 3L  COPD exacerbation -Continue IV  Solu-Medrol -continue Pulmicort -Start duo nebs  Acute on chronic diastolic CHF -Patient has gained 10 pounds since her visit with Dr. Domenic Polite on 06/08/2018. -According to Dr. Myles Gip note, the patient is supposed to be taking Lasix 40 mg twice daily; however, the patient has been taking furosemide 80 mg bid -increase IV Lasix 60 mg IV twice daily -Daily weights--NEG 2.4 lbs -Accurate I's and O's -Restart bisoprolol -03/25/2018 echo EF 65-70%, no WMA, PASP 44, normal RV  Permanent atrial fibrillation -Rate controlled -Continue warfarin -Continue diltiazem CD and bisoprolol  CKD stage 3 -baseline creatinine 1.1-1.4 -am BMP  Status post aortic valve replacement (St. Jude) -Performed in 2007 -Continue warfarin -daily INR  Hypothyroidism -Continue Synthroid  Hyperlipidemia -Continue statin  Hypokalemia -replete -check mag--2.1   Disposition Plan:   Home in 2-3 days  Family Communication:   Spouse updated at bedside  Consultants:  none  Code Status:  FULL  DVT Prophylaxis:  warfarin   Procedures: As Listed in Progress Note Above  Antibiotics: None    Subjective: Breathing better today, but still dyspneic with minimal exertion.  Still wheezing.  Denies cp, n/v/d, abd pain.  States leg edema about same as yesterday.  Denies f/c  Objective: Vitals:   06/20/18 0117 06/20/18 0528 06/20/18 0600 06/20/18 0910  BP:  107/75    Pulse:  67    Resp:  (!) 23    Temp:  97.6 F (36.4 C)    TempSrc:  Oral    SpO2: 92% 100%  95%  Weight:  87.9 kg   Height:        Intake/Output Summary (Last 24 hours) at 06/20/2018 1041 Last data filed at 06/20/2018 0600 Gross per 24 hour  Intake -  Output 900 ml  Net -900 ml   Weight change:  Exam:   General:  Pt is alert, follows commands appropriately, not in acute distress  HEENT: No icterus, No thrush, No neck mass, [place]/AT  Cardiovascular: RRR, S1/S2, no rubs, no gallops  Respiratory: bibasilar crackles,  bilateral expiratory wheeze  Abdomen: Soft/+BS, non tender, non distended, no guarding  Extremities: 2 + LE edema, No lymphangitis, No petechiae, No rashes, no synovitis   Data Reviewed: I have personally reviewed following labs and imaging studies Basic Metabolic Panel: Recent Labs  Lab 06/19/18 1402 06/20/18 0626  NA 134* 136  K 3.4* 3.3*  CL 95* 96*  CO2 27 27  GLUCOSE 81 238*  BUN 32* 34*  CREATININE 1.57* 1.48*  CALCIUM 8.8* 8.5*  MG  --  2.1   Liver Function Tests: No results for input(s): AST, ALT, ALKPHOS, BILITOT, PROT, ALBUMIN in the last 168 hours. No results for input(s): LIPASE, AMYLASE in the last 168 hours. No results for input(s): AMMONIA in the last 168 hours. Coagulation Profile: Recent Labs  Lab 06/15/18 1127 06/19/18 1402 06/20/18 0626  INR 2.0 2.05 2.49   CBC: Recent Labs  Lab 06/19/18 1402  WBC 9.6  HGB 9.8*  HCT 31.7*  MCV 85.9  PLT 242   Cardiac Enzymes: Recent Labs  Lab 06/19/18 1402  TROPONINI 0.03*   BNP: Invalid input(s): POCBNP CBG: No results for input(s): GLUCAP in the last 168 hours. HbA1C: No results for input(s): HGBA1C in the last 72 hours. Urine analysis: No results found for: COLORURINE, APPEARANCEUR, LABSPEC, PHURINE, GLUCOSEU, HGBUR, BILIRUBINUR, KETONESUR, PROTEINUR, UROBILINOGEN, NITRITE, LEUKOCYTESUR Sepsis Labs: @LABRCNTIP (procalcitonin:4,lacticidven:4) )No results found for this or any previous visit (from the past 240 hour(s)).   Scheduled Meds: . allopurinol  100 mg Oral Daily  . bisoprolol  5 mg Oral Daily  . budesonide (PULMICORT) nebulizer solution  0.5 mg Nebulization BID  . calcium carbonate  1,250 mg Oral BID  . diltiazem  120 mg Oral Daily  . furosemide  40 mg Intravenous BID  . ipratropium-albuterol  3 mL Nebulization Q6H  . levothyroxine  50 mcg Oral QAC breakfast  . loratadine  10 mg Oral Daily  . methylPREDNISolone (SOLU-MEDROL) injection  60 mg Intravenous Q6H  . multivitamin with  minerals  1 tablet Oral Daily  . pantoprazole  40 mg Oral Daily  . potassium chloride  20 mEq Oral Daily  . rOPINIRole  3 mg Oral BID  . rosuvastatin  10 mg Oral QHS  . sodium chloride flush  3 mL Intravenous Q12H  . Warfarin - Pharmacist Dosing Inpatient   Does not apply q1800   Continuous Infusions: . sodium chloride      Procedures/Studies: Dg Chest Port 1 View  Result Date: 06/19/2018 CLINICAL DATA:  Shortness of breath.  COPD. EXAM: PORTABLE CHEST 1 VIEW COMPARISON:  PA and lateral chest 05/02/2018 and 11/04/2017. FINDINGS: There is cardiomegaly without edema. The patient is status post aortic valve replacement. No pneumothorax or pleural effusion. No acute or focal bony abnormality. IMPRESSION: Cardiomegaly without acute disease. Electronically Signed   By: Inge Rise M.D.   On: 06/19/2018 14:42    Orson Eva, DO  [facility] Pager [phone]  If 7PM-7AM, please contact night-coverage [email or web address] Password [email or web address] 06/20/2018, 10:41 AM  LOS: 1 day

## 2018-06-20 NOTE — Progress Notes (Signed)
Patient eating at 0815, nebulizer to be administered at a later time

## 2018-06-20 NOTE — Progress Notes (Signed)
[place] for warfarin Indication: aortic valve replacement   Allergies  Allergen Reactions  . Penicillins Hives, Itching, Swelling and Rash    Has patient had a PCN reaction causing immediate rash, facial/tongue/throat swelling, SOB or lightheadedness with hypotension: Yes Has patient had a PCN reaction causing severe rash involving mucus membranes or skin necrosis: Yes Has patient had a PCN reaction that required hospitalization: unknown Has patient had a PCN reaction occurring within the last 10 years: No If all of the above answers are "NO", then may proceed with Cephalosporin use.    Throat swelling    Patient Measurements: Height: 5\' 8"  (172.7 cm) Weight: 193 lb 12.6 oz (87.9 kg) IBW/kg (Calculated) : 63.9   Vital Signs: Temp: 97.6 F (36.4 C) (09/01 0528) Temp Source: Oral (09/01 0528) BP: 107/75 (09/01 0528) Pulse Rate: 67 (09/01 0528)  Labs: Recent Labs    06/19/18 1402 06/20/18 0626  HGB 9.8*  --   HCT 31.7*  --   PLT 242  --   LABPROT 23.0* 26.7*  INR 2.05 2.49  CREATININE 1.57* 1.48*  TROPONINI 0.03*  --     Estimated Creatinine Clearance: 43.4 mL/min (A) (by C-G formula based on SCr of 1.48 mg/dL (H)).   Medical History: Past Medical History:  Diagnosis Date  . Acute on chronic diastolic (congestive) heart failure ([place])   . Anxiety disorder   . Aortic stenosis    Status post [employer] Jude mechanical AVR 2007  . Asthma   . Atrial fibrillation ([place])   . Carcinoid tumor of colon 2007  . Coronary atherosclerosis of native coronary artery    Status post CABG 2007  . GERD (gastroesophageal reflux disease)   . History of colonoscopy 2003   Dr. Gala Romney - normal  . Hyperlipidemia   . Macromastia   . Pedal edema    Bilateral, chronic  . Peptic stricture of esophagus 10/04/2010   GE junction on last EGD by Dr. Gala Romney, benign biopsies  . RLS (restless legs syndrome)   . Schatzki's ring     Medications:   Medications Prior to Admission  Medication Sig Dispense Refill Last Dose  . allopurinol (ZYLOPRIM) 100 MG tablet Take 100 mg by mouth daily.   06/19/2018 at Unknown time  . amoxicillin (AMOXIL) 500 MG capsule Take 2 capsules by mouth 2 (two) times daily. 10 DAY COURSE STARTING ON 06/14/2018  0 06/19/2018 at Unknown time  . benzonatate (TESSALON) 200 MG capsule Take 1 capsule by mouth 3 (three) times daily as needed for cough.   1 Past Week at Unknown time  . bisoprolol (ZEBETA) 5 MG tablet Take 1 tablet (5 mg total) by mouth daily. (Patient taking differently: Take 10 mg by mouth daily. ) 30 tablet 3 06/19/2018 at Unknown time  . CRESTOR 10 MG tablet Take 10 mg by mouth at bedtime.    06/18/2018 at Unknown time  . diltiazem (CARDIZEM CD) 120 MG 24 hr capsule Take 1 capsule (120 mg total) by mouth daily. 30 capsule 2 06/19/2018 at Unknown time  . furosemide (LASIX) 40 MG tablet Take 1 tablet (40 mg total) by mouth 2 (two) times daily. (Patient taking differently: Take 80 mg by mouth daily. ) 60 tablet 2 06/19/2018 at Unknown time  . levothyroxine (SYNTHROID, LEVOTHROID) 50 MCG tablet Take 50 mcg by mouth daily before breakfast.    06/19/2018 at Unknown time  . omeprazole (PRILOSEC) 20 MG capsule Take 20 mg by mouth daily.  06/19/2018 at Unknown time  . OXYGEN Inhale 2 L into the lungs daily.    06/19/2018 at Unknown time  . potassium chloride (KLOR-CON) 20 MEQ packet Take 20 mEq by mouth 2 (two) times daily.    06/19/2018 at Unknown time  . rOPINIRole (REQUIP) 3 MG tablet Take 3 mg by mouth 2 (two) times daily.    06/19/2018 at Unknown time  . warfarin (COUMADIN) 2 MG tablet Takes one tablet (2mg ) everyday (Patient taking differently: Take 2 mg by mouth. Takes one tablet (2mg ) everyday) 45 tablet 3 VERIFY  . albuterol (PROVENTIL HFA;VENTOLIN HFA) 108 (90 BASE) MCG/ACT inhaler Inhale 1-2 puffs into the lungs every 6 (six) hours as needed for wheezing or shortness of breath.    Taking  . ALPRAZolam (XANAX) 0.25  MG tablet Take 1 tablet (0.25 mg total) by mouth 2 (two) times daily as needed for anxiety. (Patient taking differently: Take 1 mg by mouth 2 (two) times daily as needed for anxiety. ) 10 tablet 0 Taking  . budesonide-formoterol (SYMBICORT) 160-4.5 MCG/ACT inhaler Inhale 2 puffs into the lungs 2 (two) times daily. 1 Inhaler 3 VERIFY  . calcium carbonate (OS-CAL) 600 MG TABS tablet Take 600 mg by mouth 2 (two) times daily.   Taking  . cetirizine (ZYRTEC) 10 MG tablet Take 10 mg by mouth daily.     Taking  . Multiple Vitamin (MULTIVITAMIN WITH MINERALS) TABS tablet Take 1 tablet by mouth daily.   Taking    Assessment: Pharmacy consulted to dose warfarin in patient with aortic valve replacement and atrial fibrillation. Per anticoagulation visit summary, the patient takes 4 mg every Tuesday and 3 mg ROW.  Patient's INR is therapeutic at 2.49 but trending up quickly so will lower dose today.  Goal of Therapy:  INR 2-3 Monitor platelets by anticoagulation protocol: Yes   Plan:  Warfarin 2 mg x 1 dose Monitor daily INR and s/s of bleeding  Ramond Craver 06/20/2018,12:10 PM

## 2018-06-21 LAB — CBC
HCT: 31.2 % — ABNORMAL LOW (ref 36.0–46.0)
Hemoglobin: 9.5 g/dL — ABNORMAL LOW (ref 12.0–15.0)
MCH: 26.4 pg (ref 26.0–34.0)
MCHC: 30.4 g/dL (ref 30.0–36.0)
MCV: 86.7 fL (ref 78.0–100.0)
PLATELETS: 272 10*3/uL (ref 150–400)
RBC: 3.6 MIL/uL — AB (ref 3.87–5.11)
RDW: 17.4 % — AB (ref 11.5–15.5)
WBC: 10.3 10*3/uL (ref 4.0–10.5)

## 2018-06-21 LAB — PROTIME-INR
INR: 3.84
PROTHROMBIN TIME: 37.4 s — AB (ref 11.4–15.2)

## 2018-06-21 LAB — BASIC METABOLIC PANEL
Anion gap: 10 (ref 5–15)
BUN: 41 mg/dL — AB (ref 8–23)
CO2: 29 mmol/L (ref 22–32)
Calcium: 8.8 mg/dL — ABNORMAL LOW (ref 8.9–10.3)
Chloride: 96 mmol/L — ABNORMAL LOW (ref 98–111)
Creatinine, Ser: 1.77 mg/dL — ABNORMAL HIGH (ref 0.44–1.00)
GFR calc Af Amer: 33 mL/min — ABNORMAL LOW (ref >60)
GFR, EST NON AFRICAN AMERICAN: 29 mL/min — AB (ref >60)
Glucose, Bld: 154 mg/dL — ABNORMAL HIGH (ref 70–99)
POTASSIUM: 2.9 mmol/L — AB (ref 3.5–5.1)
SODIUM: 135 mmol/L (ref 135–145)

## 2018-06-21 MED ORDER — METHYLPREDNISOLONE SODIUM SUCC 125 MG IJ SOLR
60.0000 mg | Freq: Four times a day (QID) | INTRAMUSCULAR | Status: AC
  Administered 2018-06-21: 60 mg via INTRAVENOUS
  Filled 2018-06-21: qty 2

## 2018-06-21 MED ORDER — GUAIFENESIN-DM 100-10 MG/5ML PO SYRP
5.0000 mL | ORAL_SOLUTION | ORAL | Status: DC | PRN
  Administered 2018-06-21: 5 mL via ORAL
  Filled 2018-06-21: qty 5

## 2018-06-21 MED ORDER — PREDNISONE 20 MG PO TABS
60.0000 mg | ORAL_TABLET | Freq: Every day | ORAL | Status: DC
  Administered 2018-06-22: 60 mg via ORAL
  Filled 2018-06-21: qty 3

## 2018-06-21 MED ORDER — FUROSEMIDE 80 MG PO TABS
80.0000 mg | ORAL_TABLET | Freq: Two times a day (BID) | ORAL | Status: DC
  Administered 2018-06-21 – 2018-06-22 (×2): 80 mg via ORAL
  Filled 2018-06-21 (×2): qty 1

## 2018-06-21 MED ORDER — KETOROLAC TROMETHAMINE 15 MG/ML IJ SOLN
15.0000 mg | Freq: Once | INTRAMUSCULAR | Status: AC
  Administered 2018-06-21: 15 mg via INTRAVENOUS
  Filled 2018-06-21: qty 1

## 2018-06-21 MED ORDER — IPRATROPIUM-ALBUTEROL 0.5-2.5 (3) MG/3ML IN SOLN
3.0000 mL | Freq: Four times a day (QID) | RESPIRATORY_TRACT | Status: DC
  Administered 2018-06-22 (×2): 3 mL via RESPIRATORY_TRACT
  Filled 2018-06-21 (×2): qty 3

## 2018-06-21 NOTE — Discharge Summary (Signed)
Physician Discharge Summary  [name] RKY:[id number] DOB: [birth year] DOA: 06/19/2018  PCP: Octavio Graves, DO  Admit date: 06/19/2018 Discharge date: 06/22/2018  Admitted From: Home Disposition:  Home   Recommendations for Outpatient Follow-up:  1. Follow up with PCP in 1-2 weeks 2. Please obtain BMP/CBC in one week    Discharge Condition: Stable CODE STATUS: FULL Diet recommendation: Heart Healthy   Brief/Interim Summary: 67 y.o.femalewith medical history ofdiastolic CHF, CAD, aortic stenosis with history of AVR in 2007, asthma, chronic atrial fibrillation, history of carcinoid tumor, anxiety disorder, GERD, hyperlipidemia, PUD with stricture of the esophagus at the GE junction level, Schatzki's ring, restless leg syndrome, sleep apnea not using CPAPpresenting with 10 days of progressive dyspnea.She states that she has had a developing cough in the last 24 hours with green sputum.  She has had increasing lower extremity edema over the past 1 to 2 days prior to admission. The patient saw cardiology, Dr. Domenic Polite on 06/08/2018 at which time her weight was 179 pounds. However, the patient states that she weighed herself approximately 2 days prior to this admission, and it was 189.2 pounds. She endorses compliance with her medications but states that "I drink whatever I want."She intermittently wears oxygen at nighttime at 2 L.She states that she has been taking lasix 80 mg bid rather than the prescribed 40 mg bid for last 7 days prior to admission.  In the emergency department, the patient was afebrile hemodynamically stable saturating 94% on 3 L. BMP showed potassium 3.4 with serum creatinine 1.57. WBC was unremarkable with hemoglobin at baseline. Chest x-ray showed cardiomegaly without any acute findings. BNP was 610.0. EKG showed atrial fibrillation with nonspecific ST changes. The patient was given prednisone 60 mg, duo nebs, and Lasix IV 40 mg.She was  continued on IV lasix and IV solumedrol  Discharge Diagnoses:  Acute on chronic respiratory failure with hypoxia and hypercarbia -Secondary to CHF and COPD exacerbation -Wean oxygen back to baseline oxygen -At baseline, patient is on 2 L nasal cannula at night -presently on 2L>>weaned to RA -ambulatory pulseox on RA did not show desaturation <88%  COPD exacerbation -ContinueIV Solu-Medrol>>>home with prednisone taper -continuePulmicort -Start duo nebs -ambulatory pulseox on RA did not show desaturation <88%  Acute on chronic diastolic CHF -Patient has gained 10 pounds since her visit with Dr. Domenic Polite on 06/08/2018. -pt endorsed indiscretion with fluid intake -According to Dr. Myles Gip note, the patient is supposed to be taking Lasix 40 mg twice daily;however, the patient has been taking furosemide 80 mgbid -increaseIV Lasix 60mg  IV twice daily>>>wean back to lasix 80 mg bid -Daily weights--NEG 5.5 lbs -Accurate I's and O's--incomplete -Restart bisoprolol -03/25/2018 echo EF 65-70%, no WMA, PASP 44, normal RV  Permanent atrial fibrillation -Rate controlled -Continue warfarin -Continue diltiazem CD and bisoprolol  CKD stage 3 -baseline creatinine 1.1-1.4 -am BMP -serum creatinine 1.57 on day of d/c  Status post aortic valve replacement([employer]. [employer]) -Performed in 2007 -Continue warfarin -daily INR  Hypothyroidism -Continue Synthroid  Hyperlipidemia -Continue statin  Hypokalemia -replete -check mag--2.1   Discharge Instructions   Allergies as of 06/22/2018      Reactions   Penicillins Hives, Itching, Swelling, Rash   Has patient had a PCN reaction causing immediate rash, facial/tongue/throat swelling, SOB or lightheadedness with hypotension: Yes Has patient had a PCN reaction causing severe rash involving mucus membranes or skin necrosis: Yes Has patient had a PCN reaction that required hospitalization: unknown Has patient had a PCN reaction  occurring within the  last 10 years: No If all of the above answers are "NO", then may proceed with Cephalosporin use. Throat swelling      Medication List    STOP taking these medications   amoxicillin 500 MG capsule Commonly known as:  AMOXIL     TAKE these medications   albuterol 108 (90 Base) MCG/ACT inhaler Commonly known as:  PROVENTIL HFA;VENTOLIN HFA Inhale 1-2 puffs into the lungs every 6 (six) hours as needed for wheezing or shortness of breath.   allopurinol 100 MG tablet Commonly known as:  ZYLOPRIM Take 100 mg by mouth daily.   ALPRAZolam 0.25 MG tablet Commonly known as:  XANAX Take 1 tablet (0.25 mg total) by mouth 2 (two) times daily as needed for anxiety. What changed:  how much to take   benzonatate 200 MG capsule Commonly known as:  TESSALON Take 1 capsule by mouth 3 (three) times daily as needed for cough.   bisoprolol 5 MG tablet Commonly known as:  ZEBETA Take 1 tablet (5 mg total) by mouth daily. What changed:  how much to take   budesonide-formoterol 160-4.5 MCG/ACT inhaler Commonly known as:  SYMBICORT Inhale 2 puffs into the lungs 2 (two) times daily.   calcium carbonate 600 MG Tabs tablet Commonly known as:  OS-CAL Take 600 mg by mouth 2 (two) times daily.   cetirizine 10 MG tablet Commonly known as:  ZYRTEC Take 10 mg by mouth daily.   CRESTOR 10 MG tablet Generic drug:  rosuvastatin Take 10 mg by mouth at bedtime.   cyclobenzaprine 10 MG tablet Commonly known as:  FLEXERIL Take 10 mg by mouth daily as needed for muscle spasms.   diltiazem 120 MG 24 hr capsule Commonly known as:  CARDIZEM CD Take 1 capsule (120 mg total) by mouth daily.   furosemide 80 MG tablet Commonly known as:  LASIX Take 1 tablet (80 mg total) by mouth 2 (two) times daily. What changed:    medication strength  how much to take   HYDROcodone-acetaminophen 5-325 MG tablet Commonly known as:  NORCO/VICODIN Take 1 tablet by mouth daily as needed for  moderate pain.   levothyroxine 50 MCG tablet Commonly known as:  SYNTHROID, LEVOTHROID Take 50 mcg by mouth daily before breakfast.   multivitamin with minerals Tabs tablet Take 1 tablet by mouth daily.   omeprazole 20 MG capsule Commonly known as:  PRILOSEC Take 20 mg by mouth daily.   OXYGEN Inhale 2 L into the lungs daily.   potassium chloride 20 MEQ packet Commonly known as:  KLOR-CON Take 20 mEq by mouth 2 (two) times daily.   predniSONE 10 MG tablet Commonly known as:  DELTASONE Take 6 tablets (60 mg total) by mouth daily with breakfast. And decrease by one tablet daily Start taking on:  06/23/2018   rOPINIRole 3 MG tablet Commonly known as:  REQUIP Take 3 mg by mouth 2 (two) times daily.   warfarin 2 MG tablet Commonly known as:  COUMADIN Take as directed. If you are unsure how to take this medication, talk to your nurse or doctor. Original instructions:  Takes one tablet (2mg ) everyday What changed:    how much to take  how to take this       Allergies  Allergen Reactions  . Penicillins Hives, Itching, Swelling and Rash    Has patient had a PCN reaction causing immediate rash, facial/tongue/throat swelling, SOB or lightheadedness with hypotension: Yes Has patient had a PCN reaction causing severe rash involving  mucus membranes or skin necrosis: Yes Has patient had a PCN reaction that required hospitalization: unknown Has patient had a PCN reaction occurring within the last 10 years: No If all of the above answers are "NO", then may proceed with Cephalosporin use.    Throat swelling    Consultations:  none   Procedures/Studies: US Venous Img Lower Bilateral  Result Date: 06/20/2018 CLINICAL DATA:  Lower extremity pain and edema EXAM: BILATERAL LOWER EXTREMITY VENOUS DOPPLER ULTRASOUND TECHNIQUE: Gray-scale sonography with graded compression, as well as color Doppler and duplex ultrasound were performed to evaluate the lower extremity deep venous  systems from the level of the common femoral vein and including the common femoral, femoral, profunda femoral, popliteal and calf veins including the posterior tibial, peroneal and gastrocnemius veins when visible. The superficial great saphenous vein was also interrogated. Spectral Doppler was utilized to evaluate flow at rest and with distal augmentation maneuvers in the common femoral, femoral and popliteal veins. COMPARISON:  None. FINDINGS: RIGHT LOWER EXTREMITY Common Femoral Vein: No evidence of thrombus. Normal compressibility, respiratory phasicity and response to augmentation. Saphenofemoral Junction: No evidence of thrombus. Normal compressibility and flow on color Doppler imaging. Profunda Femoral Vein: No evidence of thrombus. Normal compressibility and flow on color Doppler imaging. Femoral Vein: No evidence of thrombus. Normal compressibility, respiratory phasicity and response to augmentation. Popliteal Vein: No evidence of thrombus. Normal compressibility, respiratory phasicity and response to augmentation. Calf Veins: No evidence of thrombus. Normal compressibility and flow on color Doppler imaging. Superficial Great Saphenous Vein: No evidence of thrombus. Normal compressibility. Venous Reflux:  None. Other Findings:  None. LEFT LOWER EXTREMITY Common Femoral Vein: No evidence of thrombus. Normal compressibility, respiratory phasicity and response to augmentation. Saphenofemoral Junction: No evidence of thrombus. Normal compressibility and flow on color Doppler imaging. Profunda Femoral Vein: No evidence of thrombus. Normal compressibility and flow on color Doppler imaging. Femoral Vein: No evidence of thrombus. Normal compressibility, respiratory phasicity and response to augmentation. Popliteal Vein: No evidence of thrombus. Normal compressibility, respiratory phasicity and response to augmentation. Calf Veins: No evidence of thrombus. Normal compressibility and flow on color Doppler imaging.  Superficial Great Saphenous Vein: No evidence of thrombus. Normal compressibility. Venous Reflux:  None. Other Findings:  None. IMPRESSION: No evidence of deep venous thrombosis. Electronically Signed   By: Jerilynn Mages.  Shick M.D.   On: 06/20/2018 11:35   Dg Chest Port 1 View  Result Date: 06/19/2018 CLINICAL DATA:  Shortness of breath.  COPD. EXAM: PORTABLE CHEST 1 VIEW COMPARISON:  PA and lateral chest 05/02/2018 and 11/04/2017. FINDINGS: There is cardiomegaly without edema. The patient is status post aortic valve replacement. No pneumothorax or pleural effusion. No acute or focal bony abnormality. IMPRESSION: Cardiomegaly without acute disease. Electronically Signed   By: Inge Rise M.D.   On: 06/19/2018 14:42        Discharge Exam: Vitals:   06/22/18 0727 06/22/18 1406  BP:    Pulse:    Resp:    Temp:    SpO2: 91% 93%   Vitals:   06/22/18 0506 06/22/18 0722 06/22/18 0727 06/22/18 1406  BP: 114/87     Pulse: 99     Resp: 18     Temp: 97.8 F (36.6 C)     TempSrc: Oral     SpO2: 97% 91% 91% 93%  Weight: 87.6 kg     Height:        General: Pt is alert, awake, not in acute distress Cardiovascular: RRR, S1/S2 +,  no rubs, no gallops Respiratory: bibasilar rales, no wheeze Abdominal: Soft, NT, ND, bowel sounds + Extremities: trace LE edema, no cyanosis   The results of significant diagnostics from this hospitalization (including imaging, microbiology, ancillary and laboratory) are listed below for reference.    Significant Diagnostic Studies: US Venous Img Lower Bilateral  Result Date: 06/20/2018 CLINICAL DATA:  Lower extremity pain and edema EXAM: BILATERAL LOWER EXTREMITY VENOUS DOPPLER ULTRASOUND TECHNIQUE: Gray-scale sonography with graded compression, as well as color Doppler and duplex ultrasound were performed to evaluate the lower extremity deep venous systems from the level of the common femoral vein and including the common femoral, femoral, profunda femoral, popliteal  and calf veins including the posterior tibial, peroneal and gastrocnemius veins when visible. The superficial great saphenous vein was also interrogated. Spectral Doppler was utilized to evaluate flow at rest and with distal augmentation maneuvers in the common femoral, femoral and popliteal veins. COMPARISON:  None. FINDINGS: RIGHT LOWER EXTREMITY Common Femoral Vein: No evidence of thrombus. Normal compressibility, respiratory phasicity and response to augmentation. Saphenofemoral Junction: No evidence of thrombus. Normal compressibility and flow on color Doppler imaging. Profunda Femoral Vein: No evidence of thrombus. Normal compressibility and flow on color Doppler imaging. Femoral Vein: No evidence of thrombus. Normal compressibility, respiratory phasicity and response to augmentation. Popliteal Vein: No evidence of thrombus. Normal compressibility, respiratory phasicity and response to augmentation. Calf Veins: No evidence of thrombus. Normal compressibility and flow on color Doppler imaging. Superficial Great Saphenous Vein: No evidence of thrombus. Normal compressibility. Venous Reflux:  None. Other Findings:  None. LEFT LOWER EXTREMITY Common Femoral Vein: No evidence of thrombus. Normal compressibility, respiratory phasicity and response to augmentation. Saphenofemoral Junction: No evidence of thrombus. Normal compressibility and flow on color Doppler imaging. Profunda Femoral Vein: No evidence of thrombus. Normal compressibility and flow on color Doppler imaging. Femoral Vein: No evidence of thrombus. Normal compressibility, respiratory phasicity and response to augmentation. Popliteal Vein: No evidence of thrombus. Normal compressibility, respiratory phasicity and response to augmentation. Calf Veins: No evidence of thrombus. Normal compressibility and flow on color Doppler imaging. Superficial Great Saphenous Vein: No evidence of thrombus. Normal compressibility. Venous Reflux:  None. Other Findings:   None. IMPRESSION: No evidence of deep venous thrombosis. Electronically Signed   By: Jerilynn Mages.  Shick M.D.   On: 06/20/2018 11:35   Dg Chest Port 1 View  Result Date: 06/19/2018 CLINICAL DATA:  Shortness of breath.  COPD. EXAM: PORTABLE CHEST 1 VIEW COMPARISON:  PA and lateral chest 05/02/2018 and 11/04/2017. FINDINGS: There is cardiomegaly without edema. The patient is status post aortic valve replacement. No pneumothorax or pleural effusion. No acute or focal bony abnormality. IMPRESSION: Cardiomegaly without acute disease. Electronically Signed   By: Inge Rise M.D.   On: 06/19/2018 14:42     Microbiology: No results found for this or any previous visit (from the past 240 hour(s)).   Labs: Basic Metabolic Panel: Recent Labs  Lab 06/19/18 1402 06/20/18 0626 06/21/18 0526 06/22/18 0622  NA 134* 136 135 140  K 3.4* 3.3* 2.9* 3.2*  CL 95* 96* 96* 99  CO2 27 27 29 31   GLUCOSE 81 238* 154* 142*  BUN 32* 34* 41* 45*  CREATININE 1.57* 1.48* 1.77* 1.57*  CALCIUM 8.8* 8.5* 8.8* 9.0  MG  --  2.1  --   --    Liver Function Tests: No results for input(s): AST, ALT, ALKPHOS, BILITOT, PROT, ALBUMIN in the last 168 hours. No results for input(s): LIPASE, AMYLASE in the  last 168 hours. No results for input(s): AMMONIA in the last 168 hours. CBC: Recent Labs  Lab 06/19/18 1402 06/21/18 1102  WBC 9.6 10.3  HGB 9.8* 9.5*  HCT 31.7* 31.2*  MCV 85.9 86.7  PLT 242 272   Cardiac Enzymes: Recent Labs  Lab 06/19/18 1402  TROPONINI 0.03*   BNP: Invalid input(s): POCBNP CBG: No results for input(s): GLUCAP in the last 168 hours.  Time coordinating discharge:  36 minutes  Signed:  Orson Eva, DO [facility] Pager: [phone] 06/22/2018, 2:38 PM

## 2018-06-21 NOTE — Progress Notes (Signed)
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  She has had increasing lower extremity edema over the past 1 to 2 days prior to admission. The patient saw cardiology, Dr. Domenic Polite on 06/08/2018 at which time her weight was 179 pounds. However, the patient states that she weighed herself approximately 2 days prior to this admission, and it was 189.2 pounds. She endorses compliance with her medications but states that "I drink whatever I want."She intermittently wears oxygen at nighttime at 2 L.  She states that she has been taking lasix 80 mg bid rather than the prescribed 40 mg bid for last 7 days prior to admission.  In the emergency department, the patient was afebrile hemodynamically stable saturating 94% on 3 L. BMP showed potassium 3.4 with serum creatinine 1.57. WBC was unremarkable with hemoglobin at baseline. Chest x-ray showed cardiomegaly without any acute findings. BNP was 610.0. EKG showed atrial fibrillation with nonspecific ST changes. The patient was given prednisone 60 mg, duo nebs, and Lasix IV 40 mg.  She was continued on IV lasix and IV solumedrol  Assessment/Plan: Acute on chronic respiratory failure with hypoxia and hypercarbia -Secondary to CHF and COPD exacerbation -Wean oxygen back to baseline oxygen -At baseline, patient is on 2 L nasal cannula at night -presently on 2L  COPD exacerbation -Continue IV  Solu-Medrol -continue Pulmicort -Start duo nebs  Acute on chronic diastolic CHF -Patient has gained 10 pounds since her visit with Dr. Domenic Polite on 06/08/2018. -According to Dr. Myles Gip note, the patient is supposed to be taking Lasix 40 mg twice daily;however, the patient has been taking furosemide 80 mg bid -increase IV Lasix 60 mg IV twice daily>>>wean back to lasix 80 mg bid -Daily weights--NEG 5.5 lbs -Accurate I's and O's -Restart bisoprolol -03/25/2018 echo EF 65-70%, no WMA, PASP 44, normal RV  Permanent atrial fibrillation -Rate controlled -Continue warfarin -Continue diltiazem CD and bisoprolol  CKD stage 3 -baseline creatinine 1.1-1.4 -am BMP  Status post aortic valve replacement (St. Jude) -Performed in 2007 -Continue warfarin -daily INR  Hypothyroidism -Continue Synthroid  Hyperlipidemia -Continue statin  Hypokalemia -replete -check mag--2.1   Disposition Plan:   Home in 2-3 days  Family Communication:   Spouse updated at bedside  Consultants:  none  Code Status:  FULL  DVT Prophylaxis:  warfarin   Procedures: As Listed in Progress Note Above  Antibiotics: None    Subjective: Pt states she is breathing better.  No cp, n/v/d, f/c, cp, abd pain.  Still has some mild dyspnea on exertion  Objective: Vitals:   06/21/18 0704 06/21/18 0824 06/21/18 0826 06/21/18 1528  BP: 114/75   93/70  Pulse: 83   83  Resp: 20   20  Temp: 97.7 F (36.5 C)   98.1 F (36.7 C)  TempSrc: Oral     SpO2: 96% 96% 96% 100%  Weight: 86.5 kg     Height:        Intake/Output Summary (Last 24 hours) at  06/21/2018 1605 Last data filed at 06/21/2018 0707 Gross per 24 hour  Intake 120 ml  Output 1200 ml  Net -1080 ml   Weight change:  Exam:   General:  Pt is alert, follows commands appropriately, not in acute distress  HEENT: No icterus, No thrush, No neck mass, [place]/AT  Cardiovascular: RRR, S1/S2, no rubs, no gallops  Respiratory: fine  bibasilar rales, mild bibasilar wheeze  Abdomen: Soft/+BS, non tender, non distended, no guarding  Extremities: No edema, No lymphangitis, No petechiae, No rashes, no synovitis   Data Reviewed: I have personally reviewed following labs and imaging studies Basic Metabolic Panel: Recent Labs  Lab 06/19/18 1402 06/20/18 0626 06/21/18 0526  NA 134* 136 135  K 3.4* 3.3* 2.9*  CL 95* 96* 96*  CO2 27 27 29   GLUCOSE 81 238* 154*  BUN 32* 34* 41*  CREATININE 1.57* 1.48* 1.77*  CALCIUM 8.8* 8.5* 8.8*  MG  --  2.1  --    Liver Function Tests: No results for input(s): AST, ALT, ALKPHOS, BILITOT, PROT, ALBUMIN in the last 168 hours. No results for input(s): LIPASE, AMYLASE in the last 168 hours. No results for input(s): AMMONIA in the last 168 hours. Coagulation Profile: Recent Labs  Lab 06/15/18 1127 06/19/18 1402 06/20/18 0626 06/21/18 0526  INR 2.0 2.05 2.49 3.84   CBC: Recent Labs  Lab 06/19/18 1402 06/21/18 1102  WBC 9.6 10.3  HGB 9.8* 9.5*  HCT 31.7* 31.2*  MCV 85.9 86.7  PLT 242 272   Cardiac Enzymes: Recent Labs  Lab 06/19/18 1402  TROPONINI 0.03*   BNP: Invalid input(s): POCBNP CBG: No results for input(s): GLUCAP in the last 168 hours. HbA1C: No results for input(s): HGBA1C in the last 72 hours. Urine analysis: No results found for: COLORURINE, APPEARANCEUR, LABSPEC, PHURINE, GLUCOSEU, HGBUR, BILIRUBINUR, KETONESUR, PROTEINUR, UROBILINOGEN, NITRITE, LEUKOCYTESUR Sepsis Labs: @LABRCNTIP (procalcitonin:4,lacticidven:4) )No results found for this or any previous visit (from the past 240 hour(s)).   Scheduled Meds: . allopurinol  100 mg Oral Daily  . bisoprolol  5 mg Oral Daily  . budesonide (PULMICORT) nebulizer solution  0.5 mg Nebulization BID  . calcium carbonate  1,250 mg Oral BID  . diltiazem  120 mg Oral Daily  . furosemide  80 mg Oral BID  . ipratropium-albuterol  3 mL Nebulization Q6H  . levothyroxine  50 mcg Oral QAC breakfast  .  loratadine  10 mg Oral Daily  . methylPREDNISolone (SOLU-MEDROL) injection  60 mg Intravenous Q6H  . multivitamin with minerals  1 tablet Oral Daily  . pantoprazole  40 mg Oral Daily  . potassium chloride  20 mEq Oral Daily  . rOPINIRole  3 mg Oral BID  . rosuvastatin  10 mg Oral QHS  . sodium chloride flush  3 mL Intravenous Q12H  . Warfarin - Pharmacist Dosing Inpatient   Does not apply q1800   Continuous Infusions: . sodium chloride      Procedures/Studies: US Venous Img Lower Bilateral  Result Date: 06/20/2018 CLINICAL DATA:  Lower extremity pain and edema EXAM: BILATERAL LOWER EXTREMITY VENOUS DOPPLER ULTRASOUND TECHNIQUE: Gray-scale sonography with graded compression, as well as color Doppler and duplex ultrasound were performed to evaluate the lower extremity deep venous systems from the level of the common femoral vein and including the common femoral, femoral, profunda femoral, popliteal and calf veins including the posterior tibial, peroneal and gastrocnemius veins when visible. The superficial great saphenous vein was also interrogated. Spectral Doppler was utilized to evaluate flow at rest  and with distal augmentation maneuvers in the common femoral, femoral and popliteal veins. COMPARISON:  None. FINDINGS: RIGHT LOWER EXTREMITY Common Femoral Vein: No evidence of thrombus. Normal compressibility, respiratory phasicity and response to augmentation. Saphenofemoral Junction: No evidence of thrombus. Normal compressibility and flow on color Doppler imaging. Profunda Femoral Vein: No evidence of thrombus. Normal compressibility and flow on color Doppler imaging. Femoral Vein: No evidence of thrombus. Normal compressibility, respiratory phasicity and response to augmentation. Popliteal Vein: No evidence of thrombus. Normal compressibility, respiratory phasicity and response to augmentation. Calf Veins: No evidence of thrombus. Normal compressibility and flow on color Doppler imaging.  Superficial Great Saphenous Vein: No evidence of thrombus. Normal compressibility. Venous Reflux:  None. Other Findings:  None. LEFT LOWER EXTREMITY Common Femoral Vein: No evidence of thrombus. Normal compressibility, respiratory phasicity and response to augmentation. Saphenofemoral Junction: No evidence of thrombus. Normal compressibility and flow on color Doppler imaging. Profunda Femoral Vein: No evidence of thrombus. Normal compressibility and flow on color Doppler imaging. Femoral Vein: No evidence of thrombus. Normal compressibility, respiratory phasicity and response to augmentation. Popliteal Vein: No evidence of thrombus. Normal compressibility, respiratory phasicity and response to augmentation. Calf Veins: No evidence of thrombus. Normal compressibility and flow on color Doppler imaging. Superficial Great Saphenous Vein: No evidence of thrombus. Normal compressibility. Venous Reflux:  None. Other Findings:  None. IMPRESSION: No evidence of deep venous thrombosis. Electronically Signed   By: Jerilynn Mages.  Shick M.D.   On: 06/20/2018 11:35   Dg Chest Port 1 View  Result Date: 06/19/2018 CLINICAL DATA:  Shortness of breath.  COPD. EXAM: PORTABLE CHEST 1 VIEW COMPARISON:  PA and lateral chest 05/02/2018 and 11/04/2017. FINDINGS: There is cardiomegaly without edema. The patient is status post aortic valve replacement. No pneumothorax or pleural effusion. No acute or focal bony abnormality. IMPRESSION: Cardiomegaly without acute disease. Electronically Signed   By: Inge Rise M.D.   On: 06/19/2018 14:42    Orson Eva, DO  [facility] Pager [phone]  If 7PM-7AM, please contact night-coverage [email or web address] Password [email or web address] 06/21/2018, 4:05 PM   LOS: 2 days

## 2018-06-21 NOTE — Progress Notes (Addendum)
[place] for warfarin Indication: aortic valve replacement   Allergies  Allergen Reactions  . Penicillins Hives, Itching, Swelling and Rash    Has patient had a PCN reaction causing immediate rash, facial/tongue/throat swelling, SOB or lightheadedness with hypotension: Yes Has patient had a PCN reaction causing severe rash involving mucus membranes or skin necrosis: Yes Has patient had a PCN reaction that required hospitalization: unknown Has patient had a PCN reaction occurring within the last 10 years: No If all of the above answers are "NO", then may proceed with Cephalosporin use.    Throat swelling    Patient Measurements: Height: 5\' 8"  (172.7 cm) Weight: 190 lb 11.2 oz (86.5 kg) IBW/kg (Calculated) : 63.9   Vital Signs: Temp: 97.7 F (36.5 C) (09/02 0704) Temp Source: Oral (09/02 0704) BP: 114/75 (09/02 0704) Pulse Rate: 83 (09/02 0704)  Labs: Recent Labs    06/19/18 1402 06/20/18 0626 06/21/18 0526  HGB 9.8*  --   --   HCT 31.7*  --   --   PLT 242  --   --   LABPROT 23.0* 26.7* 37.4*  INR 2.05 2.49 3.84  CREATININE 1.57* 1.48* 1.77*  TROPONINI 0.03*  --   --     Estimated Creatinine Clearance: 36 mL/min (A) (by C-G formula based on SCr of 1.77 mg/dL (H)).   Medical History: Past Medical History:  Diagnosis Date  . Acute on chronic diastolic (congestive) heart failure ([place])   . Anxiety disorder   . Aortic stenosis    Status post St. Jude mechanical AVR 2007  . Asthma   . Atrial fibrillation ([place])   . Carcinoid tumor of colon 2007  . Coronary atherosclerosis of native coronary artery    Status post CABG 2007  . GERD (gastroesophageal reflux disease)   . History of colonoscopy 2003   Dr. Gala Romney - normal  . Hyperlipidemia   . Macromastia   . Pedal edema    Bilateral, chronic  . Peptic stricture of esophagus 10/04/2010   GE junction on last EGD by Dr. Gala Romney, benign biopsies  . RLS (restless legs syndrome)    . Schatzki's ring     Medications:  Medications Prior to Admission  Medication Sig Dispense Refill Last Dose  . allopurinol (ZYLOPRIM) 100 MG tablet Take 100 mg by mouth daily.   06/19/2018 at Unknown time  . ALPRAZolam (XANAX) 0.25 MG tablet Take 1 tablet (0.25 mg total) by mouth 2 (two) times daily as needed for anxiety. (Patient taking differently: Take 1 mg by mouth 2 (two) times daily as needed for anxiety. ) 10 tablet 0 06/19/2018 at Unknown time  . amoxicillin (AMOXIL) 500 MG capsule Take 2 capsules by mouth 2 (two) times daily. 10 DAY COURSE STARTING ON 06/14/2018  0 06/19/2018 at Unknown time  . benzonatate (TESSALON) 200 MG capsule Take 1 capsule by mouth 3 (three) times daily as needed for cough.   1 Past Week at Unknown time  . bisoprolol (ZEBETA) 5 MG tablet Take 1 tablet (5 mg total) by mouth daily. (Patient taking differently: Take 10 mg by mouth daily. ) 30 tablet 3 06/19/2018 at Unknown time  . CRESTOR 10 MG tablet Take 10 mg by mouth at bedtime.    06/18/2018 at Unknown time  . cyclobenzaprine (FLEXERIL) 10 MG tablet Take 10 mg by mouth daily as needed for muscle spasms.     Marland Kitchen diltiazem (CARDIZEM CD) 120 MG 24 hr capsule Take 1 capsule (120 mg  total) by mouth daily. 30 capsule 2 06/19/2018 at Unknown time  . furosemide (LASIX) 40 MG tablet Take 1 tablet (40 mg total) by mouth 2 (two) times daily. (Patient taking differently: Take 80 mg by mouth daily. ) 60 tablet 2 06/19/2018 at Unknown time  . HYDROcodone-acetaminophen (NORCO/VICODIN) 5-325 MG tablet Take 1 tablet by mouth daily as needed for moderate pain.     Marland Kitchen levothyroxine (SYNTHROID, LEVOTHROID) 50 MCG tablet Take 50 mcg by mouth daily before breakfast.    06/19/2018 at Unknown time  . omeprazole (PRILOSEC) 20 MG capsule Take 20 mg by mouth daily.   06/19/2018 at Unknown time  . OXYGEN Inhale 2 L into the lungs daily.    06/19/2018 at Unknown time  . potassium chloride (KLOR-CON) 20 MEQ packet Take 20 mEq by mouth 2 (two) times  daily.    06/19/2018 at Unknown time  . rOPINIRole (REQUIP) 3 MG tablet Take 3 mg by mouth 2 (two) times daily.    06/19/2018 at Unknown time  . warfarin (COUMADIN) 2 MG tablet Takes one tablet (2mg ) everyday (Patient taking differently: Take 2 mg by mouth. Takes one tablet (2mg ) everyday) 45 tablet 3 06/18/2018 at Unknown time  . albuterol (PROVENTIL HFA;VENTOLIN HFA) 108 (90 BASE) MCG/ACT inhaler Inhale 1-2 puffs into the lungs every 6 (six) hours as needed for wheezing or shortness of breath.    Taking  . budesonide-formoterol (SYMBICORT) 160-4.5 MCG/ACT inhaler Inhale 2 puffs into the lungs 2 (two) times daily. 1 Inhaler 3 VERIFY  . calcium carbonate (OS-CAL) 600 MG TABS tablet Take 600 mg by mouth 2 (two) times daily.   Taking  . cetirizine (ZYRTEC) 10 MG tablet Take 10 mg by mouth daily.     Taking  . Multiple Vitamin (MULTIVITAMIN WITH MINERALS) TABS tablet Take 1 tablet by mouth daily.   Taking    Assessment: Pharmacy consulted to dose warfarin in patient with aortic valve replacement and atrial fibrillation. Per anticoagulation visit summary, the patient takes 4 mg every Tuesday and 3 mg ROW.  Patient's INR is 3.84 today, possibly d/t CHF exacerbation, as patient appears to be eating 100% of meals.  Goal of Therapy:  INR 2-3 Monitor platelets by anticoagulation protocol: Yes   Plan:  Hold warfarin today Monitor daily INR and s/s of bleeding  [name], Pharm. D. Clinical Pharmacist 06/21/2018 10:53 AM

## 2018-06-22 LAB — BASIC METABOLIC PANEL
ANION GAP: 10 (ref 5–15)
BUN: 45 mg/dL — ABNORMAL HIGH (ref 8–23)
CALCIUM: 9 mg/dL (ref 8.9–10.3)
CO2: 31 mmol/L (ref 22–32)
Chloride: 99 mmol/L (ref 98–111)
Creatinine, Ser: 1.57 mg/dL — ABNORMAL HIGH (ref 0.44–1.00)
GFR, EST AFRICAN AMERICAN: 39 mL/min — AB (ref >60)
GFR, EST NON AFRICAN AMERICAN: 33 mL/min — AB (ref >60)
Glucose, Bld: 142 mg/dL — ABNORMAL HIGH (ref 70–99)
POTASSIUM: 3.2 mmol/L — AB (ref 3.5–5.1)
Sodium: 140 mmol/L (ref 135–145)

## 2018-06-22 LAB — PROTIME-INR
INR: 3.85
PROTHROMBIN TIME: 37.6 s — AB (ref 11.4–15.2)

## 2018-06-22 MED ORDER — WARFARIN - PHARMACIST DOSING INPATIENT: Status: DC

## 2018-06-22 MED ORDER — PREDNISONE 10 MG PO TABS: 60.0000 mg | ORAL_TABLET | Freq: Every day | ORAL | 0 refills | Status: DC

## 2018-06-22 MED ORDER — POTASSIUM CHLORIDE CRYS ER 20 MEQ PO TBCR
40.0000 meq | EXTENDED_RELEASE_TABLET | Freq: Once | ORAL | Status: AC
  Administered 2018-06-22: 40 meq via ORAL
  Filled 2018-06-22: qty 2

## 2018-06-22 MED ORDER — FUROSEMIDE 80 MG PO TABS: 80.0000 mg | ORAL_TABLET | Freq: Two times a day (BID) | ORAL | 1 refills | Status: DC

## 2018-06-22 NOTE — Care Management Important Message (Signed)
Important Message  Patient Details  Name: [name] MRN: [id number] Date of Birth: Mar 01, 1951   Medicare Important Message Given:  Yes    Shelda Altes 06/22/2018, 9:31 AM

## 2018-06-22 NOTE — Progress Notes (Signed)
Patient's IV removed.  Site WNL.  AVS reviewed with patient.  Verbalized understanding of discharge instructions, physician follow-up, medications.  Reports belongings intact and in possession at time of discharge.  Patient transported by wheelchair to main entrance at discharge.  Patient stable at time of discharge.

## 2018-06-22 NOTE — Progress Notes (Signed)
[place] for warfarin Indication: aortic valve replacement   Allergies  Allergen Reactions  . Penicillins Hives, Itching, Swelling and Rash    Has patient had a PCN reaction causing immediate rash, facial/tongue/throat swelling, SOB or lightheadedness with hypotension: Yes Has patient had a PCN reaction causing severe rash involving mucus membranes or skin necrosis: Yes Has patient had a PCN reaction that required hospitalization: unknown Has patient had a PCN reaction occurring within the last 10 years: No If all of the above answers are "NO", then may proceed with Cephalosporin use.    Throat swelling   Patient Measurements: Height: 5\' 8"  (172.7 cm) Weight: 193 lb 2 oz (87.6 kg) IBW/kg (Calculated) : 63.9  Vital Signs: Temp: 97.8 F (36.6 C) (09/03 0506) Temp Source: Oral (09/03 0506) BP: 114/87 (09/03 0506) Pulse Rate: 99 (09/03 0506)  Labs: Recent Labs    06/19/18 1402 06/20/18 0626 06/21/18 0526 06/21/18 1102 06/22/18 0622  HGB 9.8*  --   --  9.5*  --   HCT 31.7*  --   --  31.2*  --   PLT 242  --   --  272  --   LABPROT 23.0* 26.7* 37.4*  --  37.6*  INR 2.05 2.49 3.84  --  3.85  CREATININE 1.57* 1.48* 1.77*  --  1.57*  TROPONINI 0.03*  --   --   --   --    Estimated Creatinine Clearance: 40.8 mL/min (A) (by C-G formula based on SCr of 1.57 mg/dL (H)).  Assessment: Warfarin for aortic valve replacement and atrial fibrillation. Patient's INR is 3.85 today, possibly d/t CHF exacerbation, as patient appears to be eating 100% of meals.  Goal of Therapy:  INR 2-3 Monitor platelets by anticoagulation protocol: Yes   Plan:  Hold warfarin again today Monitor daily INR and s/s of bleeding  Pricilla Larsson, [facility]  06/22/2018 10:51 AM

## 2018-06-22 NOTE — Progress Notes (Signed)
RN entered room and patient reports that she, "just took a flexeril out of my bag.  I felt like I was getting ready to have a muscle spasm."  Patient educated about not taking medications from home.  Patient verbalized understanding and agreed to give nurse Flexeril to take to pharmacy.  Dr. Carles Collet notified.

## 2018-06-22 NOTE — Care Management Note (Signed)
Case Management Note  Patient Details  Name: ARIBELLE MCCOSH MRN: [id number] Date of Birth: 29-Aug-1951  Subjective/Objective:    Admitted with CHF. Pt from home, lives with husband. ind with ADL's. Pt has insurance, PCP and transportation. Pt has had 3 admissions and 1 ED visit in past 6 months. Pt is on [facility] registry but not active.                 Action/Plan: DC home today. CM will make referral for [facility] care management for disease management.   Expected Discharge Date:  06/22/18               Expected Discharge Plan:  Home/Self Care  In-House Referral:  NA  Discharge planning Services  CM Consult  Post Acute Care Choice:  NA Choice offered to:  NA  DME Arranged:    DME Agency:     HH Arranged:    HH Agency:     Status of Service:  Completed, signed off  If discussed at [employer] of Stay Meetings, dates discussed:    Additional Comments:  Sherald Barge, RN 06/22/2018, 2:48 PM

## 2018-06-22 NOTE — Progress Notes (Signed)
SATURATION QUALIFICATIONS: (This note is used to comply with regulatory documentation for home oxygen)  Patient Saturations on Room Air at Rest = 94%  Patient Saturations on Room Air while Ambulating = 90%    

## 2018-06-24 ENCOUNTER — Ambulatory Visit: Payer: Medicare HMO | Admitting: *Deleted

## 2018-06-24 DIAGNOSIS — Z952 Presence of prosthetic heart valve: Secondary | ICD-10-CM

## 2018-06-24 DIAGNOSIS — I4891 Unspecified atrial fibrillation: Secondary | ICD-10-CM

## 2018-06-24 DIAGNOSIS — Z5181 Encounter for therapeutic drug level monitoring: Secondary | ICD-10-CM

## 2018-06-24 LAB — POCT INR: INR: 2.2 (ref 2.0–3.0)

## 2018-06-24 NOTE — Patient Instructions (Signed)
Continue coumadin 1 1/2 tablet daily except 2 tablets on Tuesdays Recheck in 2.5 weeks.

## 2018-06-28 DIAGNOSIS — R5382 Chronic fatigue, unspecified: Secondary | ICD-10-CM | POA: Diagnosis not present

## 2018-06-28 DIAGNOSIS — K219 Gastro-esophageal reflux disease without esophagitis: Secondary | ICD-10-CM | POA: Diagnosis not present

## 2018-06-28 DIAGNOSIS — E8881 Metabolic syndrome: Secondary | ICD-10-CM | POA: Diagnosis not present

## 2018-06-28 DIAGNOSIS — M109 Gout, unspecified: Secondary | ICD-10-CM | POA: Diagnosis not present

## 2018-06-28 DIAGNOSIS — Z79899 Other long term (current) drug therapy: Secondary | ICD-10-CM | POA: Diagnosis not present

## 2018-06-28 DIAGNOSIS — E559 Vitamin D deficiency, unspecified: Secondary | ICD-10-CM | POA: Diagnosis not present

## 2018-06-28 DIAGNOSIS — G2581 Restless legs syndrome: Secondary | ICD-10-CM | POA: Diagnosis not present

## 2018-06-28 DIAGNOSIS — E782 Mixed hyperlipidemia: Secondary | ICD-10-CM | POA: Diagnosis not present

## 2018-06-28 DIAGNOSIS — E039 Hypothyroidism, unspecified: Secondary | ICD-10-CM | POA: Diagnosis not present

## 2018-06-28 DIAGNOSIS — R899 Unspecified abnormal finding in specimens from other organs, systems and tissues: Secondary | ICD-10-CM | POA: Diagnosis not present

## 2018-06-28 DIAGNOSIS — I10 Essential (primary) hypertension: Secondary | ICD-10-CM | POA: Diagnosis not present

## 2018-06-28 DIAGNOSIS — I5042 Chronic combined systolic (congestive) and diastolic (congestive) heart failure: Secondary | ICD-10-CM | POA: Diagnosis not present

## 2018-07-05 NOTE — Progress Notes (Deleted)
Cardiology Office Note:    Date:  07/05/2018   ID:  [name], DOB 05/22/1951, MRN [id number]  PCP:  Octavio Graves, DO  Cardiologist:  Rozann Lesches, MD   Referring MD: Octavio Graves, DO   No chief complaint on file. ***  History of Present Illness:    [name] is a 67 y.o. female with a hx of chronic diastolic heart failure, aortic stenosis status post Saint Jude mechanical AVR, on Coumadin, CAD status post CABG in 2007, permanent atrial fibrillation on Toprol and Cardizem, hyperlipidemia, GERD, CKD stage III, and anxiety.  She was recently seen in the ER on 06/19/2018 with shortness of breath and productive cough.  She was treated for acute on chronic diastolic heart failure and COPD exacerbation.  Of note her weight was 189 on admission and her weight was 179 pounds at her last clinic visit in August 2019.Marland Kitchen  Per Dr. Myles Gip note on 06/08/2018, patient was supposed to be taking 40 mg of Lasix twice daily, but she had been taking 80 mg of Lasix BID prior to hospitalization for lower extremity edema.  She was discharged on 06/21/2018.  She presents today for hospital follow-up.      Past Medical History:  Diagnosis Date  . Acute on chronic diastolic (congestive) heart failure ([place])   . Anxiety disorder   . Aortic stenosis    Status post St. Jude mechanical AVR 2007  . Asthma   . Atrial fibrillation ([place])   . Carcinoid tumor of colon 2007  . Coronary atherosclerosis of native coronary artery    Status post CABG 2007  . GERD (gastroesophageal reflux disease)   . History of colonoscopy 2003   Dr. Gala Romney - normal  . Hyperlipidemia   . Macromastia   . Pedal edema    Bilateral, chronic  . Peptic stricture of esophagus 10/04/2010   GE junction on last EGD by Dr. Gala Romney, benign biopsies  . RLS (restless legs syndrome)   . Schatzki's ring     Past Surgical History:  Procedure Laterality Date  . ABDOMINAL HYSTERECTOMY    . AORTIC VALVE REPLACEMENT  2007   #25  mm St. 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 APPENDECTOMY  2007  . BACK SURGERY     lumbar 4 and 5   . BIOPSY  02/05/2012   RMR:Two tongues of salmon-colored epithelium distal esophagus, very short-segment Barrett's s/p bx/Small hiatal hernia, otherwise normal stomach, D1, D2. Status post esophageal dilation. Biopsy showed GERD.  . Breast cyst removed     bilateral  . BREAST REDUCTION SURGERY    . CESAREAN SECTION    . 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 COLONOSCOPY  02/05/2012   UKG:URKYHC rectum, sigmoid diverticulosis,descending colon polyp , tubular adenoma  . CORONARY ARTERY BYPASS GRAFT     06/2006 - RIMA to RCA, SVG to RCA  . ESOPHAGOGASTRODUODENOSCOPY  10/03/2010   Dr. Gala Romney- schatzki's ring, shoft peptic stricture at GE junction.  . ESOPHAGOGASTRODUODENOSCOPY (EGD) WITH PROPOFOL N/A 02/08/2018   Procedure: ESOPHAGOGASTRODUODENOSCOPY (EGD) WITH PROPOFOL;  Surgeon: Daneil Dolin, MD;  [facility]: [facility];  Service: Endoscopy;  Laterality: N/A;  8:15am  . FOOT SURGERY     bilateral bunionectomy  . HERNIA REPAIR     with mesh  . LAPAROTOMY  2007   small bowel resection secondary to small bowel obstruction  . MALONEY DILATION  02/05/2012   Procedure: MALONEY  DILATION;  Surgeon: Daneil Dolin, MD;  Location: [facility];  Service: Endoscopy;  Laterality: N/A;  15mm   . MALONEY DILATION N/A 02/08/2018   Procedure: Venia Minks DILATION;  Surgeon: Daneil Dolin, MD;  Location: [facility];  Service: Endoscopy;  Laterality: N/A;  . Teeth removal      Current Medications: No outpatient medications have been marked as taking for the 07/07/18 encounter (Appointment) with Ledora Bottcher, [place].     Allergies:   Penicillins   Social History   Socioeconomic History  . Marital status: Married    Spouse name: Not on file  . Number of children: Not on file  . Years of education: Not on file  . Highest education  level: Not on file  Occupational History  . Not on file  Social Needs  . Financial resource strain: Not on file  . Food insecurity:    Worry: Not on file    Inability: Not on file  . Transportation needs:    Medical: Not on file    Non-medical: Not on file  Tobacco Use  . Smoking status: Former Smoker    Packs/day: 1.00    Years: 20.00    Pack years: 20.00    Types: Cigarettes    Last attempt to quit: 10/20/1988    Years since quitting: 29.7  . Smokeless tobacco: Never Used  Substance and Sexual Activity  . Alcohol use: No    Alcohol/week: 0.0 standard drinks  . Drug use: No  . Sexual activity: Yes    Partners: Male    Birth control/protection: None    Comment: spouse  Lifestyle  . Physical activity:    Days per week: Not on file    Minutes per session: Not on file  . Stress: Not on file  Relationships  . Social connections:    Talks on phone: Not on file    Gets together: Not on file    Attends religious service: Not on file    Active member of club or organization: Not on file    Attends meetings of clubs or organizations: Not on file    Relationship status: Not on file  Other Topics Concern  . Not on file  Social History Narrative  . Not on file     Family History: The patient's ***family history includes Cancer in her father; Stroke in her mother. There is no history of Anesthesia problems, Hypotension, Malignant hyperthermia, or Pseudochol deficiency.  ROS:   Please see the history of present illness.    *** All other systems reviewed and are negative.  EKGs/Labs/Other Studies Reviewed:    The following studies were reviewed today: ***  EKG:  EKG is *** ordered today.  The ekg ordered today demonstrates ***  Recent Labs: 05/02/2018: ALT 109 06/19/2018: B Natriuretic Peptide 610.0 06/20/2018: Magnesium 2.1 06/21/2018: Hemoglobin 9.5; Platelets 272 06/22/2018: BUN 45; Creatinine, Ser 1.57; Potassium 3.2; Sodium 140  Recent Lipid Panel No results found for:  CHOL, TRIG, HDL, CHOLHDL, VLDL, LDLCALC, LDLDIRECT  Physical Exam:    VS:  There were no vitals taken for this visit.    Wt Readings from Last 3 Encounters:  06/22/18 193 lb 2 oz (87.6 kg)  06/08/18 179 lb (81.2 kg)  05/04/18 205 lb 4 oz (93.1 kg)     GEN: *** Well nourished, well developed in no acute distress HEENT: Normal NECK: No JVD; No carotid bruits LYMPHATICS: No lymphadenopathy CARDIAC: ***RRR, no murmurs, rubs, gallops RESPIRATORY:  Clear to auscultation without rales, wheezing or rhonchi  ABDOMEN: Soft, non-tender, non-distended MUSCULOSKELETAL:  No edema; No deformity  SKIN: Warm and dry NEUROLOGIC:  Alert and oriented x 3 PSYCHIATRIC:  Normal affect   ASSESSMENT:    No diagnosis found. PLAN:    In order of problems listed above:  No diagnosis found.   Medication Adjustments/Labs and Tests Ordered: Current medicines are reviewed at length with the patient today.  Concerns regarding medicines are outlined above.  No orders of the defined types were placed in this encounter.  No orders of the defined types were placed in this encounter.   Signed, Ledora Bottcher, PA  07/05/2018 5:27 PM    [facility]

## 2018-07-07 ENCOUNTER — Ambulatory Visit: Payer: Medicare HMO | Admitting: Physician Assistant

## 2018-07-12 DIAGNOSIS — J9611 Chronic respiratory failure with hypoxia: Secondary | ICD-10-CM | POA: Diagnosis not present

## 2018-07-12 DIAGNOSIS — I10 Essential (primary) hypertension: Secondary | ICD-10-CM | POA: Diagnosis not present

## 2018-07-12 DIAGNOSIS — L03115 Cellulitis of right lower limb: Secondary | ICD-10-CM | POA: Diagnosis not present

## 2018-07-12 DIAGNOSIS — J441 Chronic obstructive pulmonary disease with (acute) exacerbation: Secondary | ICD-10-CM | POA: Diagnosis not present

## 2018-07-19 DIAGNOSIS — I5033 Acute on chronic diastolic (congestive) heart failure: Secondary | ICD-10-CM | POA: Diagnosis not present

## 2018-07-19 DIAGNOSIS — Z7901 Long term (current) use of anticoagulants: Secondary | ICD-10-CM | POA: Diagnosis not present

## 2018-07-19 DIAGNOSIS — J441 Chronic obstructive pulmonary disease with (acute) exacerbation: Secondary | ICD-10-CM | POA: Diagnosis not present

## 2018-07-22 ENCOUNTER — Ambulatory Visit: Payer: Medicare HMO | Admitting: Pharmacist

## 2018-07-22 ENCOUNTER — Encounter: Payer: Self-pay | Admitting: Gastroenterology

## 2018-07-22 ENCOUNTER — Ambulatory Visit: Payer: Medicare HMO | Admitting: Gastroenterology

## 2018-07-22 VITALS — BP 107/74 | HR 80 | Temp 98.3°F | Ht 68.0 in | Wt 202.0 lb

## 2018-07-22 DIAGNOSIS — D649 Anemia, unspecified: Secondary | ICD-10-CM

## 2018-07-22 DIAGNOSIS — Z952 Presence of prosthetic heart valve: Secondary | ICD-10-CM | POA: Diagnosis not present

## 2018-07-22 DIAGNOSIS — K2100 Gastro-esophageal reflux disease with esophagitis, without bleeding: Secondary | ICD-10-CM

## 2018-07-22 DIAGNOSIS — R740 Nonspecific elevation of levels of transaminase and lactic acid dehydrogenase [LDH]: Secondary | ICD-10-CM | POA: Diagnosis not present

## 2018-07-22 DIAGNOSIS — Z23 Encounter for immunization: Secondary | ICD-10-CM

## 2018-07-22 DIAGNOSIS — I4891 Unspecified atrial fibrillation: Secondary | ICD-10-CM | POA: Diagnosis not present

## 2018-07-22 DIAGNOSIS — Z5181 Encounter for therapeutic drug level monitoring: Secondary | ICD-10-CM

## 2018-07-22 DIAGNOSIS — R7401 Elevation of levels of liver transaminase levels: Secondary | ICD-10-CM

## 2018-07-22 DIAGNOSIS — K222 Esophageal obstruction: Secondary | ICD-10-CM | POA: Diagnosis not present

## 2018-07-22 LAB — POCT INR: INR: 3.9 — AB (ref 2.0–3.0)

## 2018-07-22 MED ORDER — OMEPRAZOLE 20 MG PO CPDR: 20.0000 mg | DELAYED_RELEASE_CAPSULE | Freq: Two times a day (BID) | ORAL | 5 refills | Status: DC

## 2018-07-22 NOTE — Patient Instructions (Signed)
Description   Skip warfarin today  Then continue coumadin 1 1/2 tablet daily except 2 tablets on Tuesdays Recheck in 2 weeks.

## 2018-07-22 NOTE — Patient Instructions (Signed)
Increase omeprazole to one capsule twice a day, 30 minutes before breakfast and 30 minutes before evening meal. New RX sent to your pharmacy. We are increasing your dose because you had erosions in your esophagus during your recent procedure and to see if higher dose will help your cough.   Follow up with Dr. Luan Pulling as planned. I suspect your coughing is related to lung disease rather than acid reflux.   Return to the office in six months.

## 2018-07-22 NOTE — Progress Notes (Signed)
Primary Care Physician: Octavio Graves, DO  Primary Gastroenterologist:  Garfield Cornea, MD   Chief Complaint  Patient presents with  . Dysphagia  . Gastroesophageal Reflux  . Anemia    HPI: [name] is a 67 y.o. female here for follow-up of dysphagia.  Seen back in March.  We last saw her she complained of feeling poorly for nearly 4 to 5 months after developing last year.  She complained of fatigue, persistent coughing, shortness of breath.  Symptoms mostly when laying down at night.  Because of history of dysphagia she did undergo a EGD with dilation, she was noted to have a peptic stricture with erosive reflux esophagitis.  She was advised to increase her omeprazole to 40 mg daily but patient states she was unaware of this.  Her dysphagia has resolved status post dilation.  Denies any typical heartburn.  No abdominal pain.  Bowel movements are okay.  No blood in the stool or melena.  She was due for surveillance colonoscopy last year for history of tubular adenomas but she has declined colonoscopy.  She is mostly concerned about persistent cough.  She does see Dr. Luan Pulling in a couple of weeks.  She was admitted back earlier in September for productive cough with green sputum, lower extremity edema.  Acute on chronic respiratory failure with hypoxia and hypercarbia felt to be secondary to CHF and COPD exacerbation.  Also felt to have acute on chronic diastolic CHF.  Patient is frustrated with persistent coughing.  She states she just wants medication that is helpful.  We discussed today that her coughing is not likely related to acid reflux but given erosive reflux esophagitis with peptic stricture on EGD earlier this year we will go ahead and increase her PPI to twice daily at least over the next 3 months to see if it makes any difference.  Current Outpatient Medications  Medication Sig Dispense Refill  . albuterol (PROVENTIL HFA;VENTOLIN HFA) 108 (90 BASE) MCG/ACT  inhaler Inhale 1-2 puffs into the lungs every 6 (six) hours as needed for wheezing or shortness of breath.     . allopurinol (ZYLOPRIM) 100 MG tablet Take 100 mg by mouth daily.    Marland Kitchen ALPRAZolam (XANAX) 0.25 MG tablet Take 1 tablet (0.25 mg total) by mouth 2 (two) times daily as needed for anxiety. (Patient taking differently: Take 1 mg by mouth 2 (two) times daily as needed for anxiety. ) 10 tablet 0  . benzonatate (TESSALON) 200 MG capsule Take 1 capsule by mouth 3 (three) times daily as needed for cough.   1  . bisoprolol (ZEBETA) 5 MG tablet Take 1 tablet (5 mg total) by mouth daily. (Patient taking differently: Take 10 mg by mouth daily. ) 30 tablet 3  . budesonide-formoterol (SYMBICORT) 160-4.5 MCG/ACT inhaler Inhale 2 puffs into the lungs 2 (two) times daily. 1 Inhaler 3  . calcium carbonate (OS-CAL) 600 MG TABS tablet Take 600 mg by mouth 2 (two) times daily.    . cetirizine (ZYRTEC) 10 MG tablet Take 10 mg by mouth daily.      . CRESTOR 10 MG tablet Take 10 mg by mouth at bedtime.     . cyclobenzaprine (FLEXERIL) 10 MG tablet Take 10 mg by mouth daily as needed for muscle spasms.    Marland Kitchen diltiazem (CARDIZEM CD) 120 MG 24 hr capsule Take 1 capsule (120 mg total) by mouth daily. 30 capsule 2  . furosemide (LASIX) 80 MG tablet Take 1  tablet (80 mg total) by mouth 2 (two) times daily. 60 tablet 1  . HYDROcodone-acetaminophen (NORCO/VICODIN) 5-325 MG tablet Take 1 tablet by mouth daily as needed for moderate pain.    Marland Kitchen levothyroxine (SYNTHROID, LEVOTHROID) 50 MCG tablet Take 50 mcg by mouth daily before breakfast.     . Multiple Vitamin (MULTIVITAMIN WITH MINERALS) TABS tablet Take 1 tablet by mouth daily.    Marland Kitchen omeprazole (PRILOSEC) 20 MG capsule Take 20 mg by mouth daily.    . OXYGEN Inhale 2 L into the lungs daily.     . potassium chloride (KLOR-CON) 20 MEQ packet Take 20 mEq by mouth 2 (two) times daily.     . predniSONE (DELTASONE) 10 MG tablet Take 6 tablets (60 mg total) by mouth daily with  breakfast. And decrease by one tablet daily 21 tablet 0  . rOPINIRole (REQUIP) 3 MG tablet Take 3 mg by mouth 2 (two) times daily.     Marland Kitchen warfarin (COUMADIN) 2 MG tablet Takes one tablet (2mg ) everyday (Patient taking differently: Take 2 mg by mouth. Takes one tablet (2mg ) everyday) 45 tablet 3   No current facility-administered medications for this visit.     Allergies as of 07/22/2018 - Review Complete 07/22/2018  Allergen Reaction Noted  . Penicillins Hives, Itching, Swelling, and Rash 06/05/2009   Past Medical History:  Diagnosis Date  . Acute on chronic diastolic (congestive) heart failure ([place])   . Anxiety disorder   . Aortic stenosis    Status post St. Jude mechanical AVR 2007  . Asthma   . Atrial fibrillation ([place])   . Carcinoid tumor of colon 2007  . Coronary atherosclerosis of native coronary artery    Status post CABG 2007  . GERD (gastroesophageal reflux disease)   . History of colonoscopy 2003   Dr. Gala Romney - normal  . Hyperlipidemia   . Macromastia   . Pedal edema    Bilateral, chronic  . Peptic stricture of esophagus 10/04/2010   GE junction on last EGD by Dr. Gala Romney, benign biopsies  . RLS (restless legs syndrome)   . Schatzki's ring      Past Surgical History:  Procedure Laterality Date  . ABDOMINAL HYSTERECTOMY    . AORTIC VALVE REPLACEMENT  2007   #25 mm St. 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 APPENDECTOMY  2007  . BACK SURGERY     lumbar 4 and 5   . BIOPSY  02/05/2012   RMR:Two tongues of salmon-colored epithelium distal esophagus, very short-segment Barrett's s/p bx/Small hiatal hernia, otherwise normal stomach, D1, D2. Status post esophageal dilation. Biopsy showed GERD.  . Breast cyst removed     bilateral  . BREAST REDUCTION SURGERY    . CESAREAN SECTION    . 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 COLONOSCOPY  02/05/2012   [id number] rectum, sigmoid diverticulosis,descending  colon polyp , tubular adenoma  . CORONARY ARTERY BYPASS GRAFT     06/2006 - RIMA to RCA, SVG to RCA  . ESOPHAGOGASTRODUODENOSCOPY  10/03/2010   Dr. Gala Romney- schatzki's ring, shoft peptic stricture at GE junction.  . ESOPHAGOGASTRODUODENOSCOPY (EGD) WITH PROPOFOL N/A 02/08/2018   Procedure: ESOPHAGOGASTRODUODENOSCOPY (EGD) WITH PROPOFOL;  Surgeon: Daneil Dolin, MD;  [facility];  Service: Endoscopy;  Laterality: N/A;  8:15am  . FOOT SURGERY     bilateral bunionectomy  . HERNIA REPAIR     with mesh  . LAPAROTOMY  2007  small bowel resection secondary to small bowel obstruction  . MALONEY DILATION  02/05/2012   Procedure: Venia Minks DILATION;  Surgeon: Daneil Dolin, MD;  Location: [facility];  Service: Endoscopy;  Laterality: N/A;  53mm   . MALONEY DILATION N/A 02/08/2018   Procedure: Venia Minks DILATION;  Surgeon: Daneil Dolin, MD;  [facility];  Service: Endoscopy;  Laterality: N/A;  . Teeth removal      ROS:  General: Negative for anorexia, weight loss, fever, chills, fatigue, weakness.  Weight up 20 pounds in the past 6 weeks. ENT: Negative for hoarseness, difficulty swallowing , nasal congestion. CV: Negative for chest pain, angina, palpitations, positive dyspnea on exertion, positive peripheral edema.  Respiratory: Negative for dyspnea at rest, dyspnea on exertion, positive cough, sputum, wheezing.  GI: See history of present illness. GU:  Negative for dysuria, hematuria, urinary incontinence, urinary frequency, nocturnal urination.  Endo: Negative for unusual weight change.    Physical Examination:   BP 107/74   Pulse 80   Temp 98.3 F (36.8 C) (Oral)   Ht 5\' 8"  (1.727 m)   Wt 202 lb (91.6 kg)   BMI 30.71 kg/m   General: Chronically ill-appearing female, rounded face, in no acute distress.  Eyes: No icterus. Mouth: Oropharyngeal mucosa moist and pink , no lesions erythema or exudate. Lungs: Clear to auscultation bilaterally.  Heart: Regular rate and  rhythm, no murmurs rubs or gallops.  Audible artificial valve Abdomen: Bowel sounds are normal, nontender, nondistended, no hepatosplenomegaly or masses, no abdominal bruits or hernia , no rebound or guarding.   Extremities: 2-3+ lower extremity edema. No clubbing or deformities. Neuro: Alert and oriented x 4   Skin: Warm and dry, no jaundice.   Psych: Alert and cooperative, normal mood and affect.  Labs:  Lab Results  Component Value Date   CREATININE 1.57 (H) 06/22/2018   BUN 45 (H) 06/22/2018   NA 140 06/22/2018   K 3.2 (L) 06/22/2018   CL 99 06/22/2018   CO2 31 06/22/2018   Lab Results  Component Value Date   ALT 109 (H) 05/02/2018   AST 98 (H) 05/02/2018   ALKPHOS 88 05/02/2018   BILITOT 1.1 05/02/2018   Lab Results  Component Value Date   WBC 10.3 06/21/2018   HGB 9.5 (L) 06/21/2018   HCT 31.2 (L) 06/21/2018   MCV 86.7 06/21/2018   PLT 272 06/21/2018   Lab Results  Component Value Date   IRON 99 01/14/2018   TIBC 475 (H) 01/14/2018   FERRITIN 25 01/14/2018    Imaging Studies: No results found.

## 2018-07-23 ENCOUNTER — Encounter: Payer: Self-pay | Admitting: Gastroenterology

## 2018-07-23 ENCOUNTER — Telehealth: Payer: Self-pay | Admitting: Gastroenterology

## 2018-07-23 DIAGNOSIS — R740 Nonspecific elevation of levels of transaminase and lactic acid dehydrogenase [LDH]: Secondary | ICD-10-CM

## 2018-07-23 DIAGNOSIS — D649 Anemia, unspecified: Secondary | ICD-10-CM

## 2018-07-23 DIAGNOSIS — R7401 Elevation of levels of liver transaminase levels: Secondary | ICD-10-CM

## 2018-07-23 NOTE — Assessment & Plan Note (Signed)
Isolated transaminitis back in July.  Update labs.

## 2018-07-23 NOTE — Progress Notes (Signed)
cc'ed to pcp and to dr Luan Pulling per lsl

## 2018-07-23 NOTE — Assessment & Plan Note (Signed)
67 year old female presenting for follow-up of EGD with esophageal dilation for peptic stricture, reflux esophagitis.  Reflux well controlled but given findings on EGD, omeprazole 20 mg daily not entirely controlling her reflux.  Patient has chronic cough that she is the most concerned about.  She has a history of CHF, COPD, pneumonia about 8 to 10 months ago.  Suspect her cough is multifactorial related cardiopulmonary disease rather than reflux.  We will go ahead and increase her omeprazole to 20 mg twice daily for complicated GERD.  Have her come back in 6 months for reevaluation.

## 2018-07-23 NOTE — Telephone Encounter (Signed)
Please let patient know that I reviewed her last two hospitalizations after she left office yesterday. We need to update labs further evaluating anemia and abnormal LFTs.   Please have her do the following labs: LFTs, CBC (orders placed) She needs to complete ifobt.

## 2018-07-23 NOTE — Assessment & Plan Note (Signed)
Stable chronic anemia dating back at least early 2018.  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  Her EGD is up-to-date.  Patient declined on colonoscopy.  Would recommend completing I FOBT, repeating labs in the near future.

## 2018-07-26 ENCOUNTER — Encounter (HOSPITAL_COMMUNITY): Payer: Self-pay

## 2018-07-26 ENCOUNTER — Inpatient Hospital Stay (HOSPITAL_COMMUNITY)
Admission: EM | Admit: 2018-07-26 | Discharge: 2018-08-02 | DRG: 291 | Disposition: A | Discharge status: Home Health | Payer: Medicare HMO | Source: Ambulatory Visit | Attending: Internal Medicine | Admitting: Internal Medicine

## 2018-07-26 ENCOUNTER — Other Ambulatory Visit: Payer: Self-pay

## 2018-07-26 ENCOUNTER — Emergency Department (HOSPITAL_COMMUNITY): Payer: Medicare HMO

## 2018-07-26 DIAGNOSIS — N183 Chronic kidney disease, stage 3 unspecified: Secondary | ICD-10-CM | POA: Diagnosis present

## 2018-07-26 DIAGNOSIS — Z880 Allergy status to penicillin: Secondary | ICD-10-CM

## 2018-07-26 DIAGNOSIS — I878 Other specified disorders of veins: Secondary | ICD-10-CM | POA: Diagnosis present

## 2018-07-26 DIAGNOSIS — Z7901 Long term (current) use of anticoagulants: Secondary | ICD-10-CM | POA: Diagnosis not present

## 2018-07-26 DIAGNOSIS — J9622 Acute and chronic respiratory failure with hypercapnia: Secondary | ICD-10-CM | POA: Diagnosis present

## 2018-07-26 DIAGNOSIS — R0902 Hypoxemia: Secondary | ICD-10-CM

## 2018-07-26 DIAGNOSIS — I2510 Atherosclerotic heart disease of native coronary artery without angina pectoris: Secondary | ICD-10-CM | POA: Diagnosis present

## 2018-07-26 DIAGNOSIS — I4821 Permanent atrial fibrillation: Secondary | ICD-10-CM | POA: Diagnosis present

## 2018-07-26 DIAGNOSIS — J9601 Acute respiratory failure with hypoxia: Secondary | ICD-10-CM | POA: Diagnosis not present

## 2018-07-26 DIAGNOSIS — Z9119 Patient's noncompliance with other medical treatment and regimen: Secondary | ICD-10-CM

## 2018-07-26 DIAGNOSIS — E039 Hypothyroidism, unspecified: Secondary | ICD-10-CM | POA: Diagnosis present

## 2018-07-26 DIAGNOSIS — J9621 Acute and chronic respiratory failure with hypoxia: Secondary | ICD-10-CM | POA: Diagnosis not present

## 2018-07-26 DIAGNOSIS — G4700 Insomnia, unspecified: Secondary | ICD-10-CM | POA: Diagnosis present

## 2018-07-26 DIAGNOSIS — R778 Other specified abnormalities of plasma proteins: Secondary | ICD-10-CM | POA: Diagnosis present

## 2018-07-26 DIAGNOSIS — Z90710 Acquired absence of both cervix and uterus: Secondary | ICD-10-CM

## 2018-07-26 DIAGNOSIS — I340 Nonrheumatic mitral (valve) insufficiency: Secondary | ICD-10-CM | POA: Diagnosis not present

## 2018-07-26 DIAGNOSIS — R7989 Other specified abnormal findings of blood chemistry: Secondary | ICD-10-CM | POA: Diagnosis present

## 2018-07-26 DIAGNOSIS — Z952 Presence of prosthetic heart valve: Secondary | ICD-10-CM

## 2018-07-26 DIAGNOSIS — Z8701 Personal history of pneumonia (recurrent): Secondary | ICD-10-CM

## 2018-07-26 DIAGNOSIS — I480 Paroxysmal atrial fibrillation: Secondary | ICD-10-CM | POA: Diagnosis present

## 2018-07-26 DIAGNOSIS — R05 Cough: Secondary | ICD-10-CM | POA: Diagnosis not present

## 2018-07-26 DIAGNOSIS — I5031 Acute diastolic (congestive) heart failure: Secondary | ICD-10-CM

## 2018-07-26 DIAGNOSIS — I5033 Acute on chronic diastolic (congestive) heart failure: Secondary | ICD-10-CM | POA: Diagnosis not present

## 2018-07-26 DIAGNOSIS — K222 Esophageal obstruction: Secondary | ICD-10-CM | POA: Diagnosis present

## 2018-07-26 DIAGNOSIS — G2581 Restless legs syndrome: Secondary | ICD-10-CM | POA: Diagnosis present

## 2018-07-26 DIAGNOSIS — E785 Hyperlipidemia, unspecified: Secondary | ICD-10-CM | POA: Diagnosis present

## 2018-07-26 DIAGNOSIS — D649 Anemia, unspecified: Secondary | ICD-10-CM | POA: Diagnosis present

## 2018-07-26 DIAGNOSIS — Z79890 Hormone replacement therapy (postmenopausal): Secondary | ICD-10-CM

## 2018-07-26 DIAGNOSIS — Z951 Presence of aortocoronary bypass graft: Secondary | ICD-10-CM

## 2018-07-26 DIAGNOSIS — G4733 Obstructive sleep apnea (adult) (pediatric): Secondary | ICD-10-CM | POA: Diagnosis present

## 2018-07-26 DIAGNOSIS — I352 Nonrheumatic aortic (valve) stenosis with insufficiency: Secondary | ICD-10-CM | POA: Diagnosis present

## 2018-07-26 DIAGNOSIS — J441 Chronic obstructive pulmonary disease with (acute) exacerbation: Secondary | ICD-10-CM | POA: Diagnosis not present

## 2018-07-26 DIAGNOSIS — I482 Chronic atrial fibrillation, unspecified: Secondary | ICD-10-CM | POA: Diagnosis present

## 2018-07-26 DIAGNOSIS — E876 Hypokalemia: Secondary | ICD-10-CM | POA: Diagnosis not present

## 2018-07-26 DIAGNOSIS — I4891 Unspecified atrial fibrillation: Secondary | ICD-10-CM | POA: Diagnosis not present

## 2018-07-26 DIAGNOSIS — F419 Anxiety disorder, unspecified: Secondary | ICD-10-CM | POA: Diagnosis present

## 2018-07-26 DIAGNOSIS — Z87891 Personal history of nicotine dependence: Secondary | ICD-10-CM

## 2018-07-26 DIAGNOSIS — Z85030 Personal history of malignant carcinoid tumor of large intestine: Secondary | ICD-10-CM

## 2018-07-26 DIAGNOSIS — Z79899 Other long term (current) drug therapy: Secondary | ICD-10-CM

## 2018-07-26 DIAGNOSIS — R0602 Shortness of breath: Secondary | ICD-10-CM | POA: Diagnosis not present

## 2018-07-26 DIAGNOSIS — I509 Heart failure, unspecified: Secondary | ICD-10-CM | POA: Diagnosis not present

## 2018-07-26 DIAGNOSIS — I712 Thoracic aortic aneurysm, without rupture: Secondary | ICD-10-CM | POA: Diagnosis present

## 2018-07-26 DIAGNOSIS — K219 Gastro-esophageal reflux disease without esophagitis: Secondary | ICD-10-CM | POA: Diagnosis present

## 2018-07-26 LAB — COMPREHENSIVE METABOLIC PANEL
ALT: 21 U/L (ref 0–44)
AST: 35 U/L (ref 15–41)
Albumin: 3.3 g/dL — ABNORMAL LOW (ref 3.5–5.0)
Alkaline Phosphatase: 86 U/L (ref 38–126)
Anion gap: 10 (ref 5–15)
BUN: 25 mg/dL — ABNORMAL HIGH (ref 8–23)
CHLORIDE: 94 mmol/L — AB (ref 98–111)
CO2: 29 mmol/L (ref 22–32)
CREATININE: 1.83 mg/dL — AB (ref 0.44–1.00)
Calcium: 8.6 mg/dL — ABNORMAL LOW (ref 8.9–10.3)
GFR, EST AFRICAN AMERICAN: 32 mL/min — AB (ref >60)
GFR, EST NON AFRICAN AMERICAN: 28 mL/min — AB (ref >60)
Glucose, Bld: 134 mg/dL — ABNORMAL HIGH (ref 70–99)
POTASSIUM: 2.9 mmol/L — AB (ref 3.5–5.1)
Sodium: 133 mmol/L — ABNORMAL LOW (ref 135–145)
Total Bilirubin: 1.1 mg/dL (ref 0.3–1.2)
Total Protein: 7.3 g/dL (ref 6.5–8.1)

## 2018-07-26 LAB — CBC WITH DIFFERENTIAL/PLATELET
BASOS ABS: 0.1 10*3/uL (ref 0.0–0.1)
BASOS PCT: 1 %
EOS PCT: 0 %
Eosinophils Absolute: 0 10*3/uL (ref 0.0–0.7)
HCT: 32.4 % — ABNORMAL LOW (ref 36.0–46.0)
Hemoglobin: 9.9 g/dL — ABNORMAL LOW (ref 12.0–15.0)
Lymphocytes Relative: 24 %
Lymphs Abs: 2.1 10*3/uL (ref 0.7–4.0)
MCH: 26.1 pg (ref 26.0–34.0)
MCHC: 30.6 g/dL (ref 30.0–36.0)
MCV: 85.5 fL (ref 78.0–100.0)
MONO ABS: 1 10*3/uL (ref 0.1–1.0)
Monocytes Relative: 12 %
Neutro Abs: 5.5 10*3/uL (ref 1.7–7.7)
Neutrophils Relative %: 63 %
PLATELETS: 322 10*3/uL (ref 150–400)
RBC: 3.79 MIL/uL — ABNORMAL LOW (ref 3.87–5.11)
RDW: 17.3 % — AB (ref 11.5–15.5)
WBC: 8.7 10*3/uL (ref 4.0–10.5)

## 2018-07-26 LAB — PROTIME-INR
INR: 3.33
Prothrombin Time: 33.5 seconds — ABNORMAL HIGH (ref 11.4–15.2)

## 2018-07-26 LAB — I-STAT TROPONIN, ED: TROPONIN I, POC: 0.02 ng/mL (ref 0.00–0.08)

## 2018-07-26 LAB — MAGNESIUM: Magnesium: 1.9 mg/dL (ref 1.7–2.4)

## 2018-07-26 LAB — TROPONIN I
TROPONIN I: 0.03 ng/mL — AB (ref <0.03)
Troponin I: <0.03 ng/mL (ref <0.03)

## 2018-07-26 LAB — BRAIN NATRIURETIC PEPTIDE: B NATRIURETIC PEPTIDE 5: 813 pg/mL — AB (ref 0.0–100.0)

## 2018-07-26 MED ORDER — TEMAZEPAM 15 MG PO CAPS
30.0000 mg | ORAL_CAPSULE | Freq: Every evening | ORAL | Status: DC | PRN
  Administered 2018-07-26 – 2018-08-01 (×5): 30 mg via ORAL
  Filled 2018-07-26 (×6): qty 2

## 2018-07-26 MED ORDER — ALBUTEROL SULFATE (2.5 MG/3ML) 0.083% IN NEBU
3.0000 mL | INHALATION_SOLUTION | Freq: Four times a day (QID) | RESPIRATORY_TRACT | Status: DC | PRN
  Administered 2018-07-27 – 2018-07-28 (×4): 3 mL via RESPIRATORY_TRACT
  Filled 2018-07-26 (×5): qty 3

## 2018-07-26 MED ORDER — POTASSIUM CHLORIDE CRYS ER 20 MEQ PO TBCR: 30.0000 meq | EXTENDED_RELEASE_TABLET | Freq: Once | ORAL | Status: AC

## 2018-07-26 MED ORDER — MAGNESIUM OXIDE 400 (241.3 MG) MG PO TABS
400.0000 mg | ORAL_TABLET | Freq: Every day | ORAL | Status: DC
  Administered 2018-07-27 – 2018-08-02 (×7): 400 mg via ORAL
  Filled 2018-07-26 (×7): qty 1

## 2018-07-26 MED ORDER — PREDNISONE 50 MG PO TABS
60.0000 mg | ORAL_TABLET | Freq: Once | ORAL | Status: AC
  Administered 2018-07-26: 60 mg via ORAL
  Filled 2018-07-26: qty 1

## 2018-07-26 MED ORDER — ACETAMINOPHEN 325 MG PO TABS
650.0000 mg | ORAL_TABLET | Freq: Four times a day (QID) | ORAL | Status: DC | PRN
  Administered 2018-07-28: 650 mg via ORAL
  Filled 2018-07-26: qty 2

## 2018-07-26 MED ORDER — CYCLOBENZAPRINE HCL 10 MG PO TABS: 10.0000 mg | ORAL_TABLET | Freq: Every day | ORAL | Status: DC | PRN

## 2018-07-26 MED ORDER — ENOXAPARIN SODIUM 40 MG/0.4ML ~~LOC~~ SOLN: 40.0000 mg | SUBCUTANEOUS | Status: DC

## 2018-07-26 MED ORDER — DILTIAZEM HCL ER COATED BEADS 120 MG PO CP24
120.0000 mg | ORAL_CAPSULE | Freq: Every day | ORAL | Status: DC
  Administered 2018-07-27 – 2018-08-02 (×7): 120 mg via ORAL
  Filled 2018-07-26 (×7): qty 1

## 2018-07-26 MED ORDER — ACETAMINOPHEN 650 MG RE SUPP: 650.0000 mg | Freq: Four times a day (QID) | RECTAL | Status: DC | PRN

## 2018-07-26 MED ORDER — ALPRAZOLAM 0.25 MG PO TABS
0.2500 mg | ORAL_TABLET | Freq: Two times a day (BID) | ORAL | Status: DC | PRN
  Administered 2018-07-27 – 2018-07-29 (×3): 0.25 mg via ORAL
  Filled 2018-07-26 (×3): qty 1

## 2018-07-26 MED ORDER — FUROSEMIDE 10 MG/ML IJ SOLN
80.0000 mg | Freq: Once | INTRAMUSCULAR | Status: AC
  Administered 2018-07-26: 80 mg via INTRAVENOUS
  Filled 2018-07-26: qty 8

## 2018-07-26 MED ORDER — SODIUM CHLORIDE 0.9 % IV SOLN: 250.0000 mL | INTRAVENOUS | Status: DC | PRN

## 2018-07-26 MED ORDER — ROSUVASTATIN CALCIUM 10 MG PO TABS
10.0000 mg | ORAL_TABLET | Freq: Every day | ORAL | Status: DC
  Administered 2018-07-26 – 2018-08-01 (×7): 10 mg via ORAL
  Filled 2018-07-26 (×7): qty 1

## 2018-07-26 MED ORDER — SODIUM CHLORIDE 0.9% FLUSH
3.0000 mL | Freq: Two times a day (BID) | INTRAVENOUS | Status: DC
  Administered 2018-07-27 – 2018-08-02 (×9): 3 mL via INTRAVENOUS

## 2018-07-26 MED ORDER — LORATADINE 10 MG PO TABS
10.0000 mg | ORAL_TABLET | Freq: Every day | ORAL | Status: DC
  Administered 2018-07-27 – 2018-08-02 (×7): 10 mg via ORAL
  Filled 2018-07-26 (×7): qty 1

## 2018-07-26 MED ORDER — HYDROCODONE-ACETAMINOPHEN 5-325 MG PO TABS
1.0000 | ORAL_TABLET | Freq: Every day | ORAL | Status: DC | PRN
  Administered 2018-07-28: 1 via ORAL
  Filled 2018-07-26: qty 1

## 2018-07-26 MED ORDER — BENZONATATE 100 MG PO CAPS
200.0000 mg | ORAL_CAPSULE | Freq: Three times a day (TID) | ORAL | Status: DC | PRN
  Administered 2018-07-28 (×2): 200 mg via ORAL
  Filled 2018-07-26 (×2): qty 2

## 2018-07-26 MED ORDER — ALLOPURINOL 100 MG PO TABS
100.0000 mg | ORAL_TABLET | Freq: Every day | ORAL | Status: DC
  Administered 2018-07-27 – 2018-08-02 (×7): 100 mg via ORAL
  Filled 2018-07-26 (×7): qty 1

## 2018-07-26 MED ORDER — ORAL CARE MOUTH RINSE
15.0000 mL | Freq: Two times a day (BID) | OROMUCOSAL | Status: DC
  Administered 2018-07-27 – 2018-08-02 (×9): 15 mL via OROMUCOSAL

## 2018-07-26 MED ORDER — WARFARIN - PHARMACIST DOSING INPATIENT
Status: DC
  Administered 2018-07-27 – 2018-08-02 (×4)

## 2018-07-26 MED ORDER — ROPINIROLE HCL 1 MG PO TABS
3.0000 mg | ORAL_TABLET | Freq: Two times a day (BID) | ORAL | Status: DC
  Administered 2018-07-26 – 2018-08-02 (×14): 3 mg via ORAL
  Filled 2018-07-26 (×14): qty 3

## 2018-07-26 MED ORDER — IPRATROPIUM-ALBUTEROL 0.5-2.5 (3) MG/3ML IN SOLN
3.0000 mL | Freq: Once | RESPIRATORY_TRACT | Status: AC
  Administered 2018-07-26: 3 mL via RESPIRATORY_TRACT
  Filled 2018-07-26: qty 3

## 2018-07-26 MED ORDER — BISOPROLOL FUMARATE 5 MG PO TABS
10.0000 mg | ORAL_TABLET | Freq: Every day | ORAL | Status: DC
  Administered 2018-07-27 – 2018-07-30 (×4): 10 mg via ORAL
  Filled 2018-07-26 (×4): qty 2

## 2018-07-26 MED ORDER — SODIUM CHLORIDE 0.9% FLUSH
3.0000 mL | INTRAVENOUS | Status: DC | PRN
  Administered 2018-07-27 – 2018-07-29 (×4): 3 mL via INTRAVENOUS
  Filled 2018-07-26 (×4): qty 3

## 2018-07-26 MED ORDER — POTASSIUM CHLORIDE 20 MEQ PO PACK
20.0000 meq | PACK | Freq: Two times a day (BID) | ORAL | Status: DC
  Administered 2018-07-26 – 2018-08-02 (×14): 20 meq via ORAL
  Filled 2018-07-26 (×14): qty 1

## 2018-07-26 MED ORDER — LEVOTHYROXINE SODIUM 50 MCG PO TABS
50.0000 ug | ORAL_TABLET | Freq: Every day | ORAL | Status: DC
  Administered 2018-07-27 – 2018-08-02 (×7): 50 ug via ORAL
  Filled 2018-07-26 (×7): qty 1

## 2018-07-26 MED ORDER — PANTOPRAZOLE SODIUM 40 MG PO TBEC
40.0000 mg | DELAYED_RELEASE_TABLET | Freq: Every day | ORAL | Status: DC
  Administered 2018-07-27 – 2018-08-02 (×7): 40 mg via ORAL
  Filled 2018-07-26 (×7): qty 1

## 2018-07-26 MED ORDER — MOMETASONE FURO-FORMOTEROL FUM 200-5 MCG/ACT IN AERO
2.0000 | INHALATION_SPRAY | Freq: Two times a day (BID) | RESPIRATORY_TRACT | Status: DC
  Administered 2018-07-27 – 2018-07-28 (×3): 2 via RESPIRATORY_TRACT
  Filled 2018-07-26: qty 8.8

## 2018-07-26 NOTE — ED Provider Notes (Signed)
Emergency Department Provider Note   I have reviewed the triage vital signs and the nursing notes.   HISTORY  Chief Complaint Shortness of Breath   HPI [name] is a 67 y.o. female who is a poor historian but her husband helps answer for her the presents to the emergency department today secondary to dyspnea.  Patient's husband states that she is had significant worsening shortness of breath the last few days also with significant lower extremity edema up to her pelvis.  Does not know if her abdomen is any more distended.  She went to see her cardiologist today who so told her to come to the emergency room and be admitted to the hospital for diuresis.  She has orthopnea, no fever and no productive cough.  Nursing notes it appears the patient was hypoxic in triage and has been started on nasal cannula oxygen which seems to help her symptoms some. No other associated or modifying symptoms.    Past Medical History:  Diagnosis Date  . Acute on chronic diastolic (congestive) heart failure ([place])   . Anxiety disorder   . Aortic stenosis    Status post St. Jude mechanical AVR 2007  . Asthma   . Atrial fibrillation ([place])   . Carcinoid tumor of colon 2007  . Coronary atherosclerosis of native coronary artery    Status post CABG 2007  . GERD (gastroesophageal reflux disease)   . History of colonoscopy 2003   Dr. Gala Romney - normal  . Hyperlipidemia   . Macromastia   . Pedal edema    Bilateral, chronic  . Peptic stricture of esophagus 10/04/2010   GE junction on last EGD by Dr. Gala Romney, benign biopsies  . RLS (restless legs syndrome)   . Schatzki's ring     Patient Active Problem List   Diagnosis Date Noted  . Transaminitis 07/23/2018  . Acute on chronic respiratory failure with hypoxia and hypercapnia ([place]) 06/19/2018  . Acute respiratory failure with hypoxia ([place])   . Supratherapeutic INR 05/03/2018  . CHF exacerbation ([place]) 05/02/2018  . Acute metabolic encephalopathy  29/51/8841  . CKD (chronic kidney disease) stage 3, GFR 30-59 ml/min (HCC) 05/02/2018  . Hypothyroidism 05/02/2018  . Generalized weakness 05/02/2018  . Physical deconditioning   . Coronary artery disease of bypass graft of native heart with stable angina pectoris ([place])   . Acute renal failure superimposed on stage 3 chronic kidney disease ([place])   . Acute on chronic respiratory failure with hypercapnia ([place]) 03/24/2018  . Hypomagnesemia 03/24/2018  . Hypokalemia 03/24/2018  . Hypophosphatemia 03/24/2018  . Atrial fibrillation with RVR ([place]) 03/24/2018  . Elevated troponin 03/24/2018  . Coughing 01/14/2018  . Anemia 01/14/2018  . Hx of adenomatous colonic polyps 01/14/2018  . Acute on chronic diastolic congestive heart failure ([place]) 12/17/2016  . COPD exacerbation ([place]) 12/17/2016  . Bilateral leg edema 07/05/2014  . Encounter for therapeutic drug monitoring 11/15/2013  . Chronic venous insufficiency 08/26/2011  . Chronic anticoagulation 01/11/2011  . Peptic stricture of esophagus 10/04/2010  . GERD 09/16/2010  . DYSPHAGIA 09/16/2010  . Aortic valve replaced 06/05/2009  . Hyperlipidemia 06/04/2009  . Coronary atherosclerosis of native coronary artery 06/04/2009  . Atrial fibrillation ([place]) 06/04/2009    Past Surgical History:  Procedure Laterality Date  . ABDOMINAL HYSTERECTOMY    . AORTIC VALVE REPLACEMENT  2007   #25 mm St. 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 APPENDECTOMY  2007  . BACK SURGERY  lumbar 4 and 5   . BIOPSY  02/05/2012   RMR:Two tongues of salmon-colored epithelium distal esophagus, very short-segment Barrett's s/p bx/Small hiatal hernia, otherwise normal stomach, D1, D2. Status post esophageal dilation. Biopsy showed GERD.  . Breast cyst removed     bilateral  . BREAST REDUCTION SURGERY    . CESAREAN SECTION    . 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 COLONOSCOPY  02/05/2012    NID:[id number] rectum, sigmoid diverticulosis,descending colon polyp , tubular adenoma  . CORONARY ARTERY BYPASS GRAFT     06/2006 - RIMA to RCA, SVG to RCA  . ESOPHAGOGASTRODUODENOSCOPY  10/03/2010   Dr. Gala Romney- schatzki's ring, shoft peptic stricture at GE junction.  . ESOPHAGOGASTRODUODENOSCOPY (EGD) WITH PROPOFOL N/A 02/08/2018   Procedure: ESOPHAGOGASTRODUODENOSCOPY (EGD) WITH PROPOFOL;  Surgeon: Daneil Dolin, MD;  Location: [facility];  Service: Endoscopy;  Laterality: N/A;  8:15am  . FOOT SURGERY     bilateral bunionectomy  . HERNIA REPAIR     with mesh  . LAPAROTOMY  2007   small bowel resection secondary to small bowel obstruction  . MALONEY DILATION  02/05/2012   Procedure: Venia Minks DILATION;  Surgeon: Daneil Dolin, MD;  Location: [facility];  Service: Endoscopy;  Laterality: N/A;  41mm   . MALONEY DILATION N/A 02/08/2018   Procedure: Venia Minks DILATION;  Surgeon: Daneil Dolin, MD;  Location: [facility];  Service: Endoscopy;  Laterality: N/A;  . Teeth removal      Current Outpatient Rx  . Order #: [id number] Class: Historical Med  . Order #: [id number] Class: Historical Med  . Order #: [id number] Class: Normal  . Order #: [id number] Class: Historical Med  . Order #: [id number] Class: Print  . Order #: [id number] Class: Print  . Order #: [id number] Class: Historical Med  . Order #: [id number] Class: Historical Med  . Order #: [id number] Class: Historical Med  . Order #: [id number] Class: Historical Med  . Order #: [id number] Class: Print  . Order #: [id number] Class: Normal  . Order #: [id number] Class: Historical Med  . Order #: [id number] Class: Historical Med  . Order #: [id number] Class: Historical Med  . Order #: [id number] Class: Normal  . Order #: [id number] Class: Historical Med  . Order #: [id number] Class: Historical Med  . Order #: [id number] Class: Normal  . Order #: [id number] Class: Historical Med  . Order #: [id number] Class: No Print    Allergies Penicillins  Family History  Problem Relation  Age of Onset  . Stroke Mother   . Cancer Father   . Anesthesia problems Neg Hx   . Hypotension Neg Hx   . Malignant hyperthermia Neg Hx   . Pseudochol deficiency Neg Hx     Social History Social History   Tobacco Use  . Smoking status: Former Smoker    Packs/day: 1.00    Years: 20.00    Pack years: 20.00    Types: Cigarettes    Last attempt to quit: 10/20/1988    Years since quitting: 29.7  . Smokeless tobacco: Never Used  Substance Use Topics  . Alcohol use: No    Alcohol/week: 0.0 standard drinks  . Drug use: No    Review of Systems  All other systems negative except as documented in the HPI. All pertinent positives and negatives as reviewed in the HPI. ____________________________________________   PHYSICAL EXAM:  VITAL SIGNS: ED Triage Vitals [07/26/18 1554]  Enc Vitals Group     BP 113/81     Pulse Rate 76     Resp (!) 22  Temp 98.1 F (36.7 C)     Temp Source Oral     SpO2 (!) 86 %     Weight 200 lb 9.9 oz (91 kg)     Height 5\' 8"  (1.727 m)    Constitutional: Alert and oriented. Well appearing and in mild resp distress.  Eyes: Conjunctivae are normal. PERRL. EOMI. Head: Atraumatic. Nose: No congestion/rhinnorhea. Mouth/Throat: Mucous membranes are moist.  Oropharynx non-erythematous. Neck: No stridor.  No meningeal signs.   Cardiovascular: Normal rate, regular rhythm. Good peripheral circulation. Grossly normal heart sounds.   Respiratory: tachypneic respiratory effort.  No retractions. Lungs with bilateral crackles and wheezing. Gastrointestinal: Soft and nontender. No distention.  Musculoskeletal: BLE edema to mid thighs. No gross deformities of extremities. Neurologic:  Normal speech and language. No gross focal neurologic deficits are appreciated.  Skin:  Skin is warm, dry and intact. No rash noted.  ____________________________________________   LABS (all labs ordered are listed, but only abnormal results are displayed)  Labs Reviewed    CBC WITH DIFFERENTIAL/PLATELET - Abnormal; Notable for the following components:      Result Value   RBC 3.79 (*)    Hemoglobin 9.9 (*)    HCT 32.4 (*)    RDW 17.3 (*)    All other components within normal limits  COMPREHENSIVE METABOLIC PANEL - Abnormal; Notable for the following components:   Sodium 133 (*)    Potassium 2.9 (*)    Chloride 94 (*)    Glucose, Bld 134 (*)    BUN 25 (*)    Creatinine, Ser 1.83 (*)    Calcium 8.6 (*)    Albumin 3.3 (*)    GFR calc non Af Amer 28 (*)    GFR calc Af Amer 32 (*)    All other components within normal limits  TROPONIN I - Abnormal; Notable for the following components:   Troponin I 0.03 (*)    All other components within normal limits  BRAIN NATRIURETIC PEPTIDE - Abnormal; Notable for the following components:   B Natriuretic Peptide 813.0 (*)    All other components within normal limits  MAGNESIUM  I-STAT TROPONIN, ED   ____________________________________________  EKG   EKG Interpretation  Date/Time:  Monday July 26 2018 16:10:18 EDT Ventricular Rate:  73 PR Interval:    QRS Duration: 91 QT Interval:  406 QTC Calculation: 448 R Axis:   82 Text Interpretation:  Atrial fibrillation Borderline right axis deviation Low voltage, precordial leads Borderline repolarization abnormality No significant change since last tracing Since last tracing of earlier today Confirmed by Merrily Pew [phone]) on 07/26/2018 6:31:34 PM      ____________________________________________  RADIOLOGY  Dg Chest Port 1 View  Result Date: 07/26/2018 CLINICAL DATA:  Shortness of breath and cough EXAM: PORTABLE CHEST 1 VIEW COMPARISON:  06/19/2018 FINDINGS: Cardiac shadow remains enlarged. Postsurgical changes are again seen. Mild vascular congestion is noted slightly more prominent than that seen on the prior exam although no significant effusion or edema is noted. No bony abnormality is noted. IMPRESSION: Mild vascular congestion without  significant edema or effusion. Electronically Signed   By: Inez Catalina M.D.   On: 07/26/2018 16:59    ____________________________________________   PROCEDURES  Procedure(s) performed:   Procedures  CRITICAL CARE Performed by: Merrily Pew Total critical care time: 35 minutes Critical care time was exclusive of separately billable procedures and treating other patients. Critical care was necessary to treat or prevent imminent or life-threatening deterioration. Critical care was time  spent personally by me on the following activities: development of treatment plan with patient and/or surrogate as well as nursing, discussions with consultants, evaluation of patient's response to treatment, examination of patient, obtaining history from patient or surrogate, ordering and performing treatments and interventions, ordering and review of laboratory studies, ordering and review of radiographic studies, pulse oximetry and re-evaluation of patient's condition.  ____________________________________________   INITIAL IMPRESSION / ASSESSMENT AND PLAN / ED COURSE  Workup seems most consistent with CHF at this point. IV lasix for diuresis but 2/2 hypoxia, borderline anasarca, will admit.   Pertinent labs & imaging results that were available during my care of the patient were reviewed by me and considered in my medical decision making (see chart for details).  ____________________________________________  FINAL CLINICAL IMPRESSION(S) / ED DIAGNOSES  Final diagnoses:  Hypoxia  Congestive heart failure, unspecified HF chronicity, unspecified heart failure type ([place])  Acute respiratory failure with hypoxia (HCC)     MEDICATIONS GIVEN DURING THIS VISIT:  Medications  ipratropium-albuterol (DUONEB) 0.5-2.5 (3) MG/3ML nebulizer solution 3 mL (3 mLs Nebulization Given 07/26/18 1707)  predniSONE (DELTASONE) tablet 60 mg (60 mg Oral Given 07/26/18 1617)  furosemide (LASIX) injection 80 mg (80 mg  Intravenous Given 07/26/18 1852)     NEW OUTPATIENT MEDICATIONS STARTED DURING THIS VISIT:  New Prescriptions   No medications on file    Note:  This note was prepared with assistance of Dragon voice recognition software. Occasional wrong-word or sound-a-like substitutions may have occurred due to the inherent limitations of voice recognition software.   Merrily Pew, MD 07/26/18 [phone]

## 2018-07-26 NOTE — ED Triage Notes (Signed)
Pt reports increasing shortness of breath for the past week.  Increased swelling in legs.

## 2018-07-26 NOTE — ED Notes (Signed)
ED TO INPATIENT HANDOFF REPORT  Name/Age/Gender [name] 67 y.o. female  Code Status    Code Status Orders  (From admission, onward)         Start     Ordered   07/26/18 1908  Full code  Continuous     07/26/18 1909        Code Status History    Date Active Date Inactive Code Status Order ID Comments User Context   06/19/2018 1713 06/22/2018 2103 Full Code [id number]  Tat, David, MD Inpatient   05/02/2018 1338 05/05/2018 1641 Full Code [id number]  Memon, Jehanzeb, MD Inpatient   03/25/2018 0038 03/31/2018 2121 Full Code [id number]  Ortiz, David Manuel, MD Inpatient   03/18/2017 0451 03/19/2017 1801 Full Code [id number]  Gardner, Jared M, DO ED   12/18/2016 0005 12/20/2016 1645 Full Code [id number]  Le, Peter, MD Inpatient      Home/SNF/Other Home  Chief Complaint diff breathing  Level of Care/Admitting Diagnosis ED Disposition    ED Disposition Condition Comment   Admit  Hospital Area:  HOSPITAL [[id number]]  Level of Care: Telemetry [5]  Diagnosis: CHF (congestive heart failure) (HCC) [197293]  Admitting Physician: KIM, JAMES [[id number]]  Attending Physician: KIM, JAMES [[id number]]  PT Class (Do Not Modify): Observation [104]  PT Acc Code (Do Not Modify): Observation [10022]       Medical History Past Medical History:  Diagnosis Date  . Acute on chronic diastolic (congestive) heart failure (HCC)   . Anxiety disorder   . Aortic stenosis    Status post St. Jude mechanical AVR 2007  . Asthma   . Atrial fibrillation (HCC)   . Carcinoid tumor of colon 2007  . Coronary atherosclerosis of native coronary artery    Status post CABG 2007  . GERD (gastroesophageal reflux disease)   . History of colonoscopy 2003   Dr. Rourk - normal  . Hyperlipidemia   . Macromastia   . Pedal edema    Bilateral, chronic  . Peptic stricture of esophagus 10/04/2010   GE junction on last EGD by Dr. Rourk, benign biopsies  . RLS (restless legs syndrome)   . Schatzki's ring      Allergies Allergies  Allergen Reactions  . Penicillins Hives, Itching, Swelling and Rash    Has patient had a PCN reaction causing immediate rash, facial/tongue/throat swelling, SOB or lightheadedness with hypotension: Yes Has patient had a PCN reaction causing severe rash involving mucus membranes or skin necrosis: Yes Has patient had a PCN reaction that required hospitalization: unknown Has patient had a PCN reaction occurring within the last 10 years: No If all of the above answers are "NO", then may proceed with Cephalosporin use.    Throat swelling    IV Location/Drains/Wounds Patient Lines/Drains/Airways Status   Active Line/Drains/Airways    Name:   Placement date:   Placement time:   Site:   Days:   Peripheral IV 07/26/18 Right Forearm   07/26/18    1851    Forearm   less than 1   External Urinary Catheter   05/02/18    1329    -   85          Labs/Imaging Results for orders placed or performed during the hospital encounter of 07/26/18 (from the past 48 hour(s))  Brain natriuretic peptide     Status: Abnormal   Collection Time: 07/26/18  4:06 PM  Result Value Ref Range   B Natriuretic Peptide 813.0 (H) 0.0 - 100.0   pg/mL    Comment: Performed at [facility], [address] [place], [place] [address]  CBC with Differential/Platelet     Status: Abnormal   Collection Time: 07/26/18  4:18 PM  Result Value Ref Range   WBC 8.7 4.0 - 10.5 K/uL   RBC 3.79 (L) 3.87 - 5.11 MIL/uL   Hemoglobin 9.9 (L) 12.0 - 15.0 g/dL   HCT 32.4 (L) 36.0 - 46.0 %   MCV 85.5 78.0 - 100.0 fL   MCH 26.1 26.0 - 34.0 pg   MCHC 30.6 30.0 - 36.0 g/dL   RDW 17.3 (H) 11.5 - 15.5 %   Platelets 322 150 - 400 K/uL   Neutrophils Relative % 63 %   Neutro Abs 5.5 1.7 - 7.7 K/uL   Lymphocytes Relative 24 %   Lymphs Abs 2.1 0.7 - 4.0 K/uL   Monocytes Relative 12 %   Monocytes Absolute 1.0 0.1 - 1.0 K/uL   Eosinophils Relative 0 %   Eosinophils Absolute 0.0 0.0 - 0.7 K/uL   Basophils Relative 1  %   Basophils Absolute 0.1 0.0 - 0.1 K/uL    Comment: Performed at [facility], [address] [place], [place] [address]  Magnesium     Status: None   Collection Time: 07/26/18  4:18 PM  Result Value Ref Range   Magnesium 1.9 1.7 - 2.4 mg/dL    Comment: Performed at [facility], [address] [place], [place] [address]  Comprehensive metabolic panel     Status: Abnormal   Collection Time: 07/26/18  4:18 PM  Result Value Ref Range   Sodium 133 (L) 135 - 145 mmol/L   Potassium 2.9 (L) 3.5 - 5.1 mmol/L   Chloride 94 (L) 98 - 111 mmol/L   CO2 29 22 - 32 mmol/L   Glucose, Bld 134 (H) 70 - 99 mg/dL   BUN 25 (H) 8 - 23 mg/dL   Creatinine, Ser 1.83 (H) 0.44 - 1.00 mg/dL   Calcium 8.6 (L) 8.9 - 10.3 mg/dL   Total Protein 7.3 6.5 - 8.1 g/dL   Albumin 3.3 (L) 3.5 - 5.0 g/dL   AST 35 15 - 41 U/L   ALT 21 0 - 44 U/L   Alkaline Phosphatase 86 38 - 126 U/L   Total Bilirubin 1.1 0.3 - 1.2 mg/dL   GFR calc non Af Amer 28 (L) >60 mL/min   GFR calc Af Amer 32 (L) >60 mL/min    Comment: (NOTE) The eGFR has been calculated using the CKD EPI equation. This calculation has not been validated in all clinical situations. eGFR's persistently <60 mL/min signify possible Chronic Kidney Disease.    Anion gap 10 5 - 15    Comment: Performed at [facility], [address] [place], [place] [address]  Troponin I     Status: Abnormal   Collection Time: 07/26/18  4:18 PM  Result Value Ref Range   Troponin I 0.03 (HH) <0.03 ng/mL    Comment: CRITICAL RESULT CALLED TO, READ BACK BY AND VERIFIED WITH: ,H AT 1740 ON 10.7.2019 BY ISLEY,B Performed at  Hospital, [address] ,  [address]   I-stat troponin, ED     Status: None   Collection Time: 07/26/18  4:25 PM  Result Value Ref Range   Troponin i, poc 0.02 0.00 - 0.08 ng/mL   Comment 3            Comment: Due to the release kinetics of cTnI, a negative result   within the first hours of the onset of symptoms does not rule  out myocardial infarction with certainty. If myocardial infarction is still suspected, repeat the test at appropriate intervals.    Dg Chest Port 1 View  Result Date: 07/26/2018 CLINICAL DATA:  Shortness of breath and cough EXAM: PORTABLE CHEST 1 VIEW COMPARISON:  06/19/2018 FINDINGS: Cardiac shadow remains enlarged. Postsurgical changes are again seen. Mild vascular congestion is noted slightly more prominent than that seen on the prior exam although no significant effusion or edema is noted. No bony abnormality is noted. IMPRESSION: Mild vascular congestion without significant edema or effusion. Electronically Signed   By: Inez Catalina M.D.   On: 07/26/2018 16:59    Pending Labs Unresulted Labs (From admission, onward)    Start     Ordered   08/02/18 0500  Creatinine, serum  (enoxaparin (LOVENOX)    CrCl < 30 ml/min)  Weekly,   R    Comments:  while on enoxaparin therapy.    07/26/18 1909   07/27/18 0500  Comprehensive metabolic panel  Tomorrow morning,   R     07/26/18 1909   07/27/18 0500  CBC  Tomorrow morning,   R     07/26/18 1909   07/26/18 1934  Protime-INR  STAT,   R     07/26/18 1934   07/26/18 1909  Troponin I  Now then every 6 hours,   R     07/26/18 1909          Vitals/Pain Today's Vitals   07/26/18 1730 07/26/18 1830 07/26/18 1900 07/26/18 1930  BP: 99/70 (!) 113/96 (!) 103/92 103/79  Pulse: 67 62 75   Resp: 18 (!) _0 Temp:      TempSrc:      SpO2: 97% 99% 97%   Weight:      Height:      PainSc:        Isolation Precautions No active isolations  Medications Medications  enoxaparin (LOVENOX) injection 40 mg (has no administration in time range)  sodium chloride flush (NS) 0.9 % injection 3 mL (has no administration in time range)  sodium chloride flush (NS) 0.9 % injection 3 mL (has no administration in time range)  0.9 %  sodium chloride infusion (has no administration in time range)  acetaminophen (TYLENOL) tablet 650 mg (has no  administration in time range)    Or  acetaminophen (TYLENOL) suppository 650 mg (has no administration in time range)  potassium chloride SA (K-DUR,KLOR-CON) CR tablet 30 mEq (has no administration in time range)  ipratropium-albuterol (DUONEB) 0.5-2.5 (3) MG/3ML nebulizer solution 3 mL (3 mLs Nebulization Given 07/26/18 1707)  predniSONE (DELTASONE) tablet 60 mg (60 mg Oral Given 07/26/18 1617)  furosemide (LASIX) injection 80 mg (80 mg Intravenous Given 07/26/18 1852)    Mobility walks

## 2018-07-26 NOTE — Telephone Encounter (Signed)
Lmom, waiting on a return call.  

## 2018-07-26 NOTE — ED Notes (Signed)
Date and time results received: 07/26/18 1745 (use smartphrase ".now" to insert current time)  Test: Troponin Critical Value: 0.03 Name of Provider Notified: Dr Dayna Barker Orders Received? Or Actions Taken?: NA

## 2018-07-26 NOTE — H&P (Addendum)
TRH H&P   Patient Demographics:    [name], is a 67 y.o. female  MRN: [id number]   DOB - 1951/08/12  Admit Date - 07/26/2018  Outpatient Primary MD for the patient is Octavio Graves, DO  Referring MD/NP/PA:   Dorise Bullion  Outpatient Specialists:    Dr. Domenic Polite  Patient coming from: home  Chief Complaint  Patient presents with  . Shortness of Breath      HPI:    [name]  is a 67 y.o. female, w Jerrye Bushy, Schatzki's ring,  Asthma, Pafib, CAD, CHF (diastolic), h/o AS, s/p AVR (ST Jude) apparently c/o  Dyspnea increasing over the past 1 week. + 28lbs of weight gain.  Pt notes slight dry cough. Denies fever, chills, cp, palp, n/v, diarrhea, brbpr, dysuria, hematuria.  Pt notes that she increased her lasix to 2 pills bid without relief. Pt was told by Dr. Melina Copa to come to ER for evaluation.   In ED,  T 98.1, P 75,  Bp 103/79  Pox 86% on 2L [place]  Wbc 8.7, Hgb 9.9, Plt 322, Na 133, K 2.9, Bun 25, Creatinine 1.83  Trop 0.03 Glucose 134  BNP 813.0  (prev 610)  CXR IMPRESSION: Mild vascular congestion without significant edema or effusion.  EKG afib at 75, nl axis.   Pt will be admitted for acute diastolic chf, hypokalemia, and anemia, and mild renal insufficiency and troponin elevation.        Review of systems:    In addition to the HPI above,  No Fever-chills, No Headache, No changes with Vision or hearing, No problems swallowing food or Liquids, No Chest pain, No Abdominal pain, No Nausea or Vommitting, Bowel movements are regular, No Blood in stool or Urine, No dysuria, No new skin rashes or bruises, No new joints pains-aches,  No new weakness, tingling, numbness in any extremity, No recent weight  Loss,  + weight gain No polyuria, polydypsia or polyphagia, No significant Mental Stressors.  A full 10 point Review of Systems was done, except as  stated above, all other Review of Systems were negative.   With Past History of the following :    Past Medical History:  Diagnosis Date  . Acute on chronic diastolic (congestive) heart failure ([place])   . Anxiety disorder   . Aortic stenosis    Status post St. Jude mechanical AVR 2007  . Asthma   . Atrial fibrillation ([place])   . Carcinoid tumor of colon 2007  . Coronary atherosclerosis of native coronary artery    Status post CABG 2007  . GERD (gastroesophageal reflux disease)   . History of colonoscopy 2003   Dr. Gala Romney - normal  . Hyperlipidemia   . Macromastia   . Pedal edema    Bilateral, chronic  . Peptic stricture of esophagus 10/04/2010   GE junction on last EGD by Dr. Gala Romney, benign  biopsies  . RLS (restless legs syndrome)   . Schatzki's ring       Past Surgical History:  Procedure Laterality Date  . ABDOMINAL HYSTERECTOMY    . AORTIC VALVE REPLACEMENT  2007   #25 mm St. 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 APPENDECTOMY  2007  . BACK SURGERY     lumbar 4 and 5   . BIOPSY  02/05/2012   RMR:Two tongues of salmon-colored epithelium distal esophagus, very short-segment Barrett's s/p bx/Small hiatal hernia, otherwise normal stomach, D1, D2. Status post esophageal dilation. Biopsy showed GERD.  . Breast cyst removed     bilateral  . BREAST REDUCTION SURGERY    . CESAREAN SECTION    . 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 COLONOSCOPY  02/05/2012   RJJ:OACZYS rectum, sigmoid diverticulosis,descending colon polyp , tubular adenoma  . CORONARY ARTERY BYPASS GRAFT     06/2006 - RIMA to RCA, SVG to RCA  . ESOPHAGOGASTRODUODENOSCOPY  10/03/2010   Dr. Gala Romney- schatzki's ring, shoft peptic stricture at GE junction.  . ESOPHAGOGASTRODUODENOSCOPY (EGD) WITH PROPOFOL N/A 02/08/2018   Procedure: ESOPHAGOGASTRODUODENOSCOPY (EGD) WITH PROPOFOL;  Surgeon: Daneil Dolin, MD;  Location: [facility];  Service:  Endoscopy;  Laterality: N/A;  8:15am  . FOOT SURGERY     bilateral bunionectomy  . HERNIA REPAIR     with mesh  . LAPAROTOMY  2007   small bowel resection secondary to small bowel obstruction  . MALONEY DILATION  02/05/2012   Procedure: Venia Minks DILATION;  Surgeon: Daneil Dolin, MD;  Location: [facility] ORS;  Service: Endoscopy;  Laterality: N/A;  31m   . MALONEY DILATION N/A 02/08/2018   Procedure: MVenia MinksDILATION;  Surgeon: RDaneil Dolin MD;  Location: [facility];  Service: Endoscopy;  Laterality: N/A;  . Teeth removal        Social History:     Social History   Tobacco Use  . Smoking status: Former Smoker    Packs/day: 1.00    Years: 20.00    Pack years: 20.00    Types: Cigarettes    Last attempt to quit: 10/20/1988    Years since quitting: 29.7  . Smokeless tobacco: Never Used  Substance Use Topics  . Alcohol use: No    Alcohol/week: 0.0 standard drinks     Lives - at home  Mobility - walks by self   Family History :     Family History  Problem Relation Age of Onset  . Stroke Mother   . Cancer Father   . Anesthesia problems Neg Hx   . Hypotension Neg Hx   . Malignant hyperthermia Neg Hx   . Pseudochol deficiency Neg Hx        Home Medications:   Prior to Admission medications   Medication Sig Start Date End Date Taking? Authorizing Provider  albuterol (PROVENTIL HFA;VENTOLIN HFA) 108 (90 BASE) MCG/ACT inhaler Inhale 1-2 puffs into the lungs every 6 (six) hours as needed for wheezing or shortness of breath.    Yes [provider]  allopurinol (ZYLOPRIM) 100 MG tablet Take 100 mg by mouth daily.   Yes [provider]  ALPRAZolam (XANAX) 0.25 MG tablet Take 1 tablet (0.25 mg total) by mouth 2 (two) times daily as needed for anxiety. Patient taking differently: Take 1 mg by mouth 2 (two) times daily as needed for anxiety.  05/05/18  Yes JMurlean Iba MD  benzonatate (  TESSALON) 200 MG capsule Take 1 capsule by mouth 3 (three) times  daily as needed for cough.  06/14/18  Yes [provider]  bisoprolol (ZEBETA) 10 MG tablet Take 10 mg by mouth daily.   Yes [provider]  budesonide-formoterol (SYMBICORT) 160-4.5 MCG/ACT inhaler Inhale 2 puffs into the lungs 2 (two) times daily. 03/31/18  Yes Barton Dubois, MD  calcium carbonate (OS-CAL) 600 MG TABS tablet Take 600 mg by mouth 2 (two) times daily.   Yes [provider]  cetirizine (ZYRTEC) 10 MG tablet Take 10 mg by mouth daily.     Yes [provider]  CRESTOR 10 MG tablet Take 10 mg by mouth at bedtime.  12/02/13  Yes [provider]  cyclobenzaprine (FLEXERIL) 10 MG tablet Take 10 mg by mouth daily as needed for muscle spasms.    [provider]  diltiazem (CARDIZEM CD) 120 MG 24 hr capsule Take 1 capsule (120 mg total) by mouth daily. 04/01/18   Barton Dubois, MD  doxycycline (VIBRA-TABS) 100 MG tablet Take 100 mg by mouth 2 (two) times daily. 07/12/18   [provider]  furosemide (LASIX) 80 MG tablet Take 1 tablet (80 mg total) by mouth 2 (two) times daily. 06/22/18   Orson Eva, MD  HYDROcodone-acetaminophen (NORCO/VICODIN) 5-325 MG tablet Take 1 tablet by mouth daily as needed for moderate pain.    [provider]  levothyroxine (SYNTHROID, LEVOTHROID) 50 MCG tablet Take 50 mcg by mouth daily before breakfast.     [provider]  Multiple Vitamin (MULTIVITAMIN WITH MINERALS) TABS tablet Take 1 tablet by mouth daily.    [provider]  omeprazole (PRILOSEC) 20 MG capsule Take 1 capsule (20 mg total) by mouth 2 (two) times daily before a meal. 07/22/18   Mahala Menghini, PA-C  OXYGEN Inhale 2 L into the lungs daily.     [provider]  potassium chloride (KLOR-CON) 20 MEQ packet Take 20 mEq by mouth 2 (two) times daily.     [provider]  predniSONE (DELTASONE) 10 MG tablet Take 6 tablets (60 mg total) by mouth daily with breakfast. And decrease by one tablet daily  06/23/18   Tat, Shanon Brow, MD  rOPINIRole (REQUIP) 3 MG tablet Take 3 mg by mouth 2 (two) times daily.     [provider]  temazepam (RESTORIL) 30 MG capsule  06/24/18   [provider]  warfarin (COUMADIN) 2 MG tablet Takes one tablet ('2mg'$ ) everyday Patient taking differently: Take 2 mg by mouth. Takes one tablet ('2mg'$ ) everyday 05/08/18   Murlean Iba, MD     Allergies:     Allergies  Allergen Reactions  . Penicillins Hives, Itching, Swelling and Rash    Has patient had a PCN reaction causing immediate rash, facial/tongue/throat swelling, SOB or lightheadedness with hypotension: Yes Has patient had a PCN reaction causing severe rash involving mucus membranes or skin necrosis: Yes Has patient had a PCN reaction that required hospitalization: unknown Has patient had a PCN reaction occurring within the last 10 years: No If all of the above answers are "NO", then may proceed with Cephalosporin use.    Throat swelling     Physical Exam:   Vitals  Blood pressure 99/70, pulse 67, temperature 98.1 F (36.7 C), temperature source Oral, resp. rate 18, height '5\' 8"'$  (1.727 m), weight 91 kg, SpO2 97 %.   1. General  lying in bed , dyspneic  2. Normal affect and insight, Not  Suicidal or Homicidal, Awake Alert, Oriented X 3.  3. No F.N deficits, ALL C.Nerves Intact, Strength 5/5 all 4 extremities, Sensation intact all 4 extremities, Plantars down going.  4. Ears and Eyes appear Normal, Conjunctivae clear, PERRLA. Moist Oral Mucosa.  Cushingoid face  5. Supple Neck, + JVD, No cervical lymphadenopathy appriciated, No Carotid Bruits.  6. Symmetrical Chest wall movement, + bilateral basilar crackles, no wheezing  7. Irr, irr, s1, s2, 1/6 sem apex  8. Positive Bowel Sounds, Abdomen Soft, No tenderness, No organomegaly appriciated,No rebound -guarding or rigidity.  9.  No Cyanosis, + bilateral LE edema Slight redness of the bilateral lower ext   10. Good muscle tone,   joints appear normal , no effusions, Normal ROM.  11. No Palpable Lymph Nodes in Neck or Axillae   Data Review:    CBC Recent Labs  Lab 07/26/18 1618  WBC 8.7  HGB 9.9*  HCT 32.4*  PLT 322  MCV 85.5  MCH 26.1  MCHC 30.6  RDW 17.3*  LYMPHSABS 2.1  MONOABS 1.0  EOSABS 0.0  BASOSABS 0.1   ------------------------------------------------------------------------------------------------------------------  Chemistries  Recent Labs  Lab 07/26/18 1618  NA 133*  K 2.9*  CL 94*  CO2 29  GLUCOSE 134*  BUN 25*  CREATININE 1.83*  CALCIUM 8.6*  MG 1.9  AST 35  ALT 21  ALKPHOS 86  BILITOT 1.1   ------------------------------------------------------------------------------------------------------------------ estimated creatinine clearance is 35.7 mL/min (A) (by C-G formula based on SCr of 1.83 mg/dL (H)). ------------------------------------------------------------------------------------------------------------------ No results for input(s): TSH, T4TOTAL, T3FREE, THYROIDAB in the last 72 hours.  Invalid input(s): FREET3  Coagulation profile Recent Labs  Lab 07/22/18 1005  INR 3.9*   ------------------------------------------------------------------------------------------------------------------- No results for input(s): DDIMER in the last 72 hours. -------------------------------------------------------------------------------------------------------------------  Cardiac Enzymes Recent Labs  Lab 07/26/18 1618  TROPONINI 0.03*   ------------------------------------------------------------------------------------------------------------------    Component Value Date/Time   BNP 813.0 (H) 07/26/2018 1606     ---------------------------------------------------------------------------------------------------------------  Urinalysis No results found for: COLORURINE, APPEARANCEUR, LABSPEC, PHURINE, GLUCOSEU, HGBUR, BILIRUBINUR, KETONESUR, PROTEINUR, UROBILINOGEN,  NITRITE, LEUKOCYTESUR  ----------------------------------------------------------------------------------------------------------------   Imaging Results:    Dg Chest Port 1 View  Result Date: 07/26/2018 CLINICAL DATA:  Shortness of breath and cough EXAM: PORTABLE CHEST 1 VIEW COMPARISON:  06/19/2018 FINDINGS: Cardiac shadow remains enlarged. Postsurgical changes are again seen. Mild vascular congestion is noted slightly more prominent than that seen on the prior exam although no significant effusion or edema is noted. No bony abnormality is noted. IMPRESSION: Mild vascular congestion without significant edema or effusion. Electronically Signed   By: Inez Catalina M.D.   On: 07/26/2018 16:59       Assessment & Plan:    Active Problems:   Anemia   Hypokalemia   Elevated troponin   CHF (congestive heart failure) (HCC)    Acute CHF (diastolic), troponin elevation Tele Trop I q6h x3 Check cardiac echo Lasix '80mg'$  iv bid Monitor creatinine closely Cardiology consult ordered in computer  Hypokalemia Replete,  Check cmp in am  Pafib Cont Bisoprolol '10mg'$  po qday Cont Cardizem CD '120mg'$  po qday Cont coumadin pharmacy to dose  CAD, h/o aortic stenosis s/p AVR (St. Jude) Cont Crestor '10mg'$  po qhs  Hypothyroidism Cont Levothyroxine  Asthma Cont Symbicort 2puff bid  RLS Cont Requip '3mg'$  po qhs  Insomnia Cont Restoril '30mg'$  po qhs  Anxiety Cont xanax prn  Gout Cont Allopurinol '100mg'$  po qday  Anemia Check cbc in am Consider further w/up with iron studies, b12, folate, esr, spep,  upep     DVT Prophylaxis  Coumadin AM Labs Ordered, also please review Full Orders  Family Communication: Admission, patients condition and plan of care including tests being ordered have been discussed with the patient  who indicate understanding and agree with the plan and Code Status.  Code Status  FULL CODE  Likely DC to  home  Condition GUARDED    Consults called: none  Admission  status: observation, pt requires iv lasix for CHF exacerbation and therefore will require hospitalization for close monitoring of renal function and iv lasix. If not improving rapidly might need inpatient stay.     Time spent in minutes : 70   Jani Gravel M.D on 07/26/2018 at 7:10 PM  Between 7am to 7pm - Pager - [phone]  . After 7pm go to [email or web address] - password [facility]  [facility] - Office  [phone]

## 2018-07-26 NOTE — Progress Notes (Signed)
ANTICOAGULATION CONSULT NOTE - Initial Consult  Pharmacy Consult for Warfarin Indication: atrial fibrillation  Allergies  Allergen Reactions  . Penicillins Hives, Itching, Swelling and Rash    Has patient had a PCN reaction causing immediate rash, facial/tongue/throat swelling, SOB or lightheadedness with hypotension: Yes Has patient had a PCN reaction causing severe rash involving mucus membranes or skin necrosis: Yes Has patient had a PCN reaction that required hospitalization: unknown Has patient had a PCN reaction occurring within the last 10 years: No If all of the above answers are "NO", then may proceed with Cephalosporin use.    Throat swelling    Patient Measurements: Height: 5\' 8"  (172.7 cm) Weight: 200 lb 9.9 oz (91 kg) IBW/kg (Calculated) : 63.9 Heparin Dosing Weight:   Vital Signs: Temp: 98.1 F (36.7 C) (10/07 1554) Temp Source: Oral (10/07 1554) BP: 103/79 (10/07 1930) Pulse Rate: 75 (10/07 1900)  Labs: Recent Labs    07/26/18 1618 07/26/18 1924  HGB 9.9*  --   HCT 32.4*  --   PLT 322  --   LABPROT 33.5*  --   INR 3.33  --   CREATININE 1.83*  --   TROPONINI 0.03* <0.03    Estimated Creatinine Clearance: 35.7 mL/min (A) (by C-G formula based on SCr of 1.83 mg/dL (H)).   Medical History: Past Medical History:  Diagnosis Date  . Acute on chronic diastolic (congestive) heart failure ([place])   . Anxiety disorder   . Aortic stenosis    Status post St. Jude mechanical AVR 2007  . Asthma   . Atrial fibrillation ([place])   . Carcinoid tumor of colon 2007  . Coronary atherosclerosis of native coronary artery    Status post CABG 2007  . GERD (gastroesophageal reflux disease)   . History of colonoscopy 2003   Dr. Gala Romney - normal  . Hyperlipidemia   . Macromastia   . Pedal edema    Bilateral, chronic  . Peptic stricture of esophagus 10/04/2010   GE junction on last EGD by Dr. Gala Romney, benign biopsies  . RLS (restless legs syndrome)   . Schatzki's ring      Medications:  Medications Prior to Admission  Medication Sig Dispense Refill Last Dose  . albuterol (PROVENTIL HFA;VENTOLIN HFA) 108 (90 BASE) MCG/ACT inhaler Inhale 1-2 puffs into the lungs every 6 (six) hours as needed for wheezing or shortness of breath.    07/26/2018 at Unknown time  . allopurinol (ZYLOPRIM) 100 MG tablet Take 100 mg by mouth daily.   07/26/2018 at Unknown time  . ALPRAZolam (XANAX) 0.25 MG tablet Take 1 tablet (0.25 mg total) by mouth 2 (two) times daily as needed for anxiety. (Patient taking differently: Take 1 mg by mouth 2 (two) times daily as needed for anxiety. ) 10 tablet 0 07/26/2018 at Unknown time  . amoxicillin (AMOXIL) 500 MG capsule Take 500 mg by mouth 2 (two) times daily.   07/26/2018 at Unknown time  . benzonatate (TESSALON) 200 MG capsule Take 1 capsule by mouth 3 (three) times daily as needed for cough.   1 07/26/2018 at Unknown time  . bisoprolol (ZEBETA) 10 MG tablet Take 10 mg by mouth daily.   07/26/2018 at [place]  . budesonide-formoterol (SYMBICORT) 160-4.5 MCG/ACT inhaler Inhale 2 puffs into the lungs 2 (two) times daily. 1 Inhaler 3 07/26/2018 at Unknown time  . calcium carbonate (OS-CAL) 600 MG TABS tablet Take 600 mg by mouth 2 (two) times daily.   07/26/2018 at Unknown time  . cetirizine (  ZYRTEC) 10 MG tablet Take 10 mg by mouth daily.     07/26/2018 at Unknown time  . CRESTOR 10 MG tablet Take 10 mg by mouth at bedtime.    07/25/2018 at Unknown time  . cyclobenzaprine (FLEXERIL) 10 MG tablet Take 10 mg by mouth daily as needed for muscle spasms.   unknown at Unknown time  . diltiazem (CARDIZEM CD) 120 MG 24 hr capsule Take 1 capsule (120 mg total) by mouth daily. 30 capsule 2 07/26/2018 at Unknown time  . furosemide (LASIX) 80 MG tablet Take 1 tablet (80 mg total) by mouth 2 (two) times daily. 60 tablet 1 07/26/2018 at Unknown time  . HYDROcodone-acetaminophen (NORCO/VICODIN) 5-325 MG tablet Take 1 tablet by mouth daily as needed for moderate pain. Normally  takes one tablet at bedtime for back pain   07/25/2018 at Unknown time  . levothyroxine (SYNTHROID, LEVOTHROID) 50 MCG tablet Take 50 mcg by mouth daily before breakfast.    07/26/2018 at Unknown time  . magnesium oxide (MAG-OX) 400 MG tablet Take 400 mg by mouth daily.   07/26/2018 at Unknown time  . Multiple Vitamin (MULTIVITAMIN WITH MINERALS) TABS tablet Take 1 tablet by mouth daily.   07/26/2018 at Unknown time  . omeprazole (PRILOSEC) 20 MG capsule Take 1 capsule (20 mg total) by mouth 2 (two) times daily before a meal. 60 capsule 5 07/26/2018 at Unknown time  . OXYGEN Inhale 2 L into the lungs daily.    07/26/2018 at Unknown time  . potassium chloride (KLOR-CON) 20 MEQ packet Take 20 mEq by mouth 2 (two) times daily.    07/26/2018 at Unknown time  . rOPINIRole (REQUIP) 3 MG tablet Take 3 mg by mouth 2 (two) times daily.    07/26/2018 at Unknown time  . temazepam (RESTORIL) 30 MG capsule Take 30 mg by mouth at bedtime as needed for sleep.    Past Week at Unknown time  . warfarin (COUMADIN) 2 MG tablet Takes one tablet (2mg ) everyday (Patient taking differently: Take 3-6 mg by mouth See admin instructions. Take 3 tablets (6mg  total) on Tuesdays only. Take 1.5 (3mg  total) tablets on all other days in the evening) 45 tablet 3 07/25/2018 at 1800  . doxycycline (VIBRA-TABS) 100 MG tablet Take 100 mg by mouth 2 (two) times daily.   Completed Course at Unknown time  . predniSONE (DELTASONE) 10 MG tablet Take 6 tablets (60 mg total) by mouth daily with breakfast. And decrease by one tablet daily (Patient not taking: Reported on 07/26/2018) 21 tablet 0 Not Taking at Unknown time    Assessment: Okay for Protocol, Elevated INR on admission.  Goal of Therapy:  INR 2-3   Plan:  No warfarin this evening. Daily PT/INR. Monitor for signs and symptoms of bleeding.   Pricilla Larsson 07/26/2018,8:23 PM

## 2018-07-27 ENCOUNTER — Encounter (HOSPITAL_COMMUNITY): Payer: Self-pay | Admitting: Physician Assistant

## 2018-07-27 ENCOUNTER — Inpatient Hospital Stay (HOSPITAL_COMMUNITY): Payer: Medicare HMO

## 2018-07-27 DIAGNOSIS — E039 Hypothyroidism, unspecified: Secondary | ICD-10-CM | POA: Diagnosis not present

## 2018-07-27 DIAGNOSIS — I712 Thoracic aortic aneurysm, without rupture: Secondary | ICD-10-CM | POA: Diagnosis present

## 2018-07-27 DIAGNOSIS — Z7901 Long term (current) use of anticoagulants: Secondary | ICD-10-CM | POA: Diagnosis not present

## 2018-07-27 DIAGNOSIS — E876 Hypokalemia: Secondary | ICD-10-CM | POA: Diagnosis not present

## 2018-07-27 DIAGNOSIS — K222 Esophageal obstruction: Secondary | ICD-10-CM | POA: Diagnosis present

## 2018-07-27 DIAGNOSIS — I878 Other specified disorders of veins: Secondary | ICD-10-CM | POA: Diagnosis present

## 2018-07-27 DIAGNOSIS — I5031 Acute diastolic (congestive) heart failure: Secondary | ICD-10-CM | POA: Diagnosis not present

## 2018-07-27 DIAGNOSIS — J9601 Acute respiratory failure with hypoxia: Secondary | ICD-10-CM | POA: Diagnosis not present

## 2018-07-27 DIAGNOSIS — R7989 Other specified abnormal findings of blood chemistry: Secondary | ICD-10-CM | POA: Diagnosis not present

## 2018-07-27 DIAGNOSIS — R0902 Hypoxemia: Secondary | ICD-10-CM | POA: Diagnosis present

## 2018-07-27 DIAGNOSIS — I2510 Atherosclerotic heart disease of native coronary artery without angina pectoris: Secondary | ICD-10-CM | POA: Diagnosis not present

## 2018-07-27 DIAGNOSIS — I340 Nonrheumatic mitral (valve) insufficiency: Secondary | ICD-10-CM | POA: Diagnosis not present

## 2018-07-27 DIAGNOSIS — I4891 Unspecified atrial fibrillation: Secondary | ICD-10-CM

## 2018-07-27 DIAGNOSIS — J441 Chronic obstructive pulmonary disease with (acute) exacerbation: Secondary | ICD-10-CM | POA: Diagnosis not present

## 2018-07-27 DIAGNOSIS — I480 Paroxysmal atrial fibrillation: Secondary | ICD-10-CM | POA: Diagnosis not present

## 2018-07-27 DIAGNOSIS — J9621 Acute and chronic respiratory failure with hypoxia: Secondary | ICD-10-CM | POA: Diagnosis not present

## 2018-07-27 DIAGNOSIS — I4821 Permanent atrial fibrillation: Secondary | ICD-10-CM | POA: Diagnosis not present

## 2018-07-27 DIAGNOSIS — D649 Anemia, unspecified: Secondary | ICD-10-CM | POA: Diagnosis not present

## 2018-07-27 DIAGNOSIS — I5033 Acute on chronic diastolic (congestive) heart failure: Principal | ICD-10-CM

## 2018-07-27 DIAGNOSIS — F419 Anxiety disorder, unspecified: Secondary | ICD-10-CM | POA: Diagnosis present

## 2018-07-27 DIAGNOSIS — G4700 Insomnia, unspecified: Secondary | ICD-10-CM | POA: Diagnosis present

## 2018-07-27 DIAGNOSIS — Z79890 Hormone replacement therapy (postmenopausal): Secondary | ICD-10-CM | POA: Diagnosis not present

## 2018-07-27 DIAGNOSIS — I352 Nonrheumatic aortic (valve) stenosis with insufficiency: Secondary | ICD-10-CM | POA: Diagnosis present

## 2018-07-27 DIAGNOSIS — G2581 Restless legs syndrome: Secondary | ICD-10-CM | POA: Diagnosis present

## 2018-07-27 DIAGNOSIS — N183 Chronic kidney disease, stage 3 (moderate): Secondary | ICD-10-CM | POA: Diagnosis not present

## 2018-07-27 DIAGNOSIS — E785 Hyperlipidemia, unspecified: Secondary | ICD-10-CM | POA: Diagnosis present

## 2018-07-27 DIAGNOSIS — J9622 Acute and chronic respiratory failure with hypercapnia: Secondary | ICD-10-CM | POA: Diagnosis not present

## 2018-07-27 DIAGNOSIS — Z79899 Other long term (current) drug therapy: Secondary | ICD-10-CM | POA: Diagnosis not present

## 2018-07-27 DIAGNOSIS — K219 Gastro-esophageal reflux disease without esophagitis: Secondary | ICD-10-CM | POA: Diagnosis present

## 2018-07-27 DIAGNOSIS — G4733 Obstructive sleep apnea (adult) (pediatric): Secondary | ICD-10-CM | POA: Diagnosis present

## 2018-07-27 LAB — CBC
HCT: 30.6 % — ABNORMAL LOW (ref 36.0–46.0)
HEMOGLOBIN: 9.2 g/dL — AB (ref 12.0–15.0)
MCH: 25.7 pg — AB (ref 26.0–34.0)
MCHC: 30.1 g/dL (ref 30.0–36.0)
MCV: 85.5 fL (ref 80.0–100.0)
Platelets: 279 10*3/uL (ref 150–400)
RBC: 3.58 MIL/uL — ABNORMAL LOW (ref 3.87–5.11)
RDW: 17.3 % — ABNORMAL HIGH (ref 11.5–15.5)
WBC: 5.5 10*3/uL (ref 4.0–10.5)

## 2018-07-27 LAB — COMPREHENSIVE METABOLIC PANEL
ALBUMIN: 3 g/dL — AB (ref 3.5–5.0)
ALK PHOS: 76 U/L (ref 38–126)
ALT: 19 U/L (ref 0–44)
AST: 34 U/L (ref 15–41)
Anion gap: 10 (ref 5–15)
BILIRUBIN TOTAL: 0.9 mg/dL (ref 0.3–1.2)
BUN: 26 mg/dL — AB (ref 8–23)
CALCIUM: 8.4 mg/dL — AB (ref 8.9–10.3)
CO2: 27 mmol/L (ref 22–32)
CREATININE: 1.75 mg/dL — AB (ref 0.44–1.00)
Chloride: 97 mmol/L — ABNORMAL LOW (ref 98–111)
GFR calc Af Amer: 34 mL/min — ABNORMAL LOW (ref >60)
GFR, EST NON AFRICAN AMERICAN: 29 mL/min — AB (ref >60)
GLUCOSE: 140 mg/dL — AB (ref 70–99)
Potassium: 3.4 mmol/L — ABNORMAL LOW (ref 3.5–5.1)
Sodium: 134 mmol/L — ABNORMAL LOW (ref 135–145)
Total Protein: 6.4 g/dL — ABNORMAL LOW (ref 6.5–8.1)

## 2018-07-27 LAB — ECHOCARDIOGRAM COMPLETE
HEIGHTINCHES: 68 in
Weight: 3276.92 oz

## 2018-07-27 LAB — PROTIME-INR
INR: 3.08
PROTHROMBIN TIME: 31.6 s — AB (ref 11.4–15.2)

## 2018-07-27 LAB — TROPONIN I
Troponin I: <0.03 ng/mL (ref <0.03)
Troponin I: <0.03 ng/mL (ref <0.03)

## 2018-07-27 MED ORDER — FUROSEMIDE 10 MG/ML IJ SOLN
80.0000 mg | Freq: Two times a day (BID) | INTRAMUSCULAR | Status: AC
  Administered 2018-07-27 – 2018-08-01 (×12): 80 mg via INTRAVENOUS
  Filled 2018-07-27 (×12): qty 8

## 2018-07-27 NOTE — Progress Notes (Signed)
IV Lasix 80 mg held this AM due to soft BP. Dr. Ree Kida made aware. New order to hold IV lasix until Cardiology sees patient.

## 2018-07-27 NOTE — Progress Notes (Signed)
PROGRESS NOTE    [name]  NOM:[id number] DOB: Jun 17, 1951 DOA: 07/26/2018 PCP: Octavio Graves, DO   Brief Narrative:  HPI on 07/26/2018 by Dr. Jani Gravel  [name]  is a 67 y.o. female, w Jerrye Bushy, Schatzki's ring,  Asthma, Pafib, CAD, CHF (diastolic), h/o AS, s/p AVR (ST Jude) apparently c/o  Dyspnea increasing over the past 1 week. + 28lbs of weight gain.  Pt notes slight dry cough. Denies fever, chills, cp, palp, n/v, diarrhea, brbpr, dysuria, hematuria.  Pt notes that she increased her lasix to 2 pills bid without relief. Pt was told by Dr. Melina Copa to come to ER for evaluation.   Interim history Admitted for CHF exacerbation. Cardiology consulted.  Assessment & Plan   Acute diastolic CHF exacerbation -presented with dyspnea and orthopnea along with 28lb weight gain in one month -Admits to being noncompliant with her fluid intake -BNP 813 -Chest xray showed mild vascular congestion -Cardiology consulted and appreciated, placed on Lasix 80 mg IV twice daily -Monitor intake and output, daily weights -Echocardiogram pending  Persistent atrial fibrillation -Continue bisoprolol, Cardizem, Coumadin  Hypokalemia -Potassium up to 3.4, will continue to replace and monitor BMP -obtained magnesium level 1.9  Coronary artery disease -Currently chest pain-free -Continue bisoprolol, statin  Chronic kidney disease stage III -Baseline creatinine approximately 1.3-1.6 -Currently creatinine 1.75, continue to monitor closely given the patient will be diuresing  History of aortic stenosis -As per St Jude valve replacement  Hypothyroidism -Continue Synthroid  Chronic normocytic anemia -Hemoglobin currently 9.2 and appears to be at baseline -Continue to monitor CBC  Asthma -Stable, no active wheezing -Continue Symbicort  Restless leg syndrome -Continue Requip  Insomnia -Continue Restoril  Anxiety -Continue Xanax  DVT Prophylaxis  Coumadin  Code Status:  Full  Family Communication: None at bedside  Disposition Plan: Admitted. Suspect discharge to home when stable.   Consultants Cardiology   Procedures  None  Antibiotics   Anti-infectives (From admission, onward)   None      Subjective:   Gerre Pebbles seen and examined today.  Feels her breathing is the same. Continues to cough. Feels she had had more wheezing. Denies current chest pain, shortness of breath, abdominal pain, N/V/D/C.  Objective:   Vitals:   07/27/18 0510 07/27/18 0608 07/27/18 0852 07/27/18 1058  BP:  99/67 97/68   Pulse:  73 82   Resp:  20    Temp:  97.6 F (36.4 C)    TempSrc:  Oral    SpO2: 98% 100%  96%  Weight:      Height:        Intake/Output Summary (Last 24 hours) at 07/27/2018 1145 Last data filed at 07/27/2018 0800 Gross per 24 hour  Intake 480 ml  Output 400 ml  Net 80 ml   Filed Weights   07/26/18 1554 07/26/18 1945  Weight: 91 kg 92.9 kg    Exam  General: Well developed, well nourished, NAD, appears stated age  HEENT: NCAT, mucous membranes moist.   Neck: Supple  Cardiovascular: S1 S2 auscultated, 2/6 SEM, click, irregular  Respiratory: Clear to auscultation bilaterally with equal chest rise  Abdomen: Soft, nontender, nondistended, + bowel sounds  Extremities: warm dry without cyanosis clubbing. +1LE edema B/L with chronic skin changes   Neuro: AAOx3, nonfocal  Psych: Normal affect and demeanor with intact judgement and insight   Data Reviewed: I have personally reviewed following labs and imaging studies  CBC: Recent Labs  Lab 07/26/18 1618 07/27/18 0115  WBC  8.7 5.5  NEUTROABS 5.5  --   HGB 9.9* 9.2*  HCT 32.4* 30.6*  MCV 85.5 85.5  PLT 322 784   Basic Metabolic Panel: Recent Labs  Lab 07/26/18 1618 07/27/18 0115  NA 133* 134*  K 2.9* 3.4*  CL 94* 97*  CO2 29 27  GLUCOSE 134* 140*  BUN 25* 26*  CREATININE 1.83* 1.75*  CALCIUM 8.6* 8.4*  MG 1.9  --    GFR: Estimated Creatinine Clearance:  37.7 mL/min (A) (by C-G formula based on SCr of 1.75 mg/dL (H)). Liver Function Tests: Recent Labs  Lab 07/26/18 1618 07/27/18 0115  AST 35 34  ALT 21 19  ALKPHOS 86 76  BILITOT 1.1 0.9  PROT 7.3 6.4*  ALBUMIN 3.3* 3.0*   No results for input(s): LIPASE, AMYLASE in the last 168 hours. No results for input(s): AMMONIA in the last 168 hours. Coagulation Profile: Recent Labs  Lab 07/22/18 1005 07/26/18 1618 07/27/18 0115  INR 3.9* 3.33 3.08   Cardiac Enzymes: Recent Labs  Lab 07/26/18 1618 07/26/18 1924 07/27/18 0115 07/27/18 0757  TROPONINI 0.03* <0.03 <0.03 <0.03   BNP (last 3 results) No results for input(s): PROBNP in the last 8760 hours. HbA1C: No results for input(s): HGBA1C in the last 72 hours. CBG: No results for input(s): GLUCAP in the last 168 hours. Lipid Profile: No results for input(s): CHOL, HDL, LDLCALC, TRIG, CHOLHDL, LDLDIRECT in the last 72 hours. Thyroid Function Tests: No results for input(s): TSH, T4TOTAL, FREET4, T3FREE, THYROIDAB in the last 72 hours. Anemia Panel: No results for input(s): VITAMINB12, FOLATE, FERRITIN, TIBC, IRON, RETICCTPCT in the last 72 hours. Urine analysis: No results found for: COLORURINE, APPEARANCEUR, LABSPEC, PHURINE, GLUCOSEU, HGBUR, BILIRUBINUR, KETONESUR, PROTEINUR, UROBILINOGEN, NITRITE, LEUKOCYTESUR Sepsis Labs: @LABRCNTIP (procalcitonin:4,lacticidven:4)  )No results found for this or any previous visit (from the past 240 hour(s)).    Radiology Studies: Dg Chest Port 1 View  Result Date: 07/26/2018 CLINICAL DATA:  Shortness of breath and cough EXAM: PORTABLE CHEST 1 VIEW COMPARISON:  06/19/2018 FINDINGS: Cardiac shadow remains enlarged. Postsurgical changes are again seen. Mild vascular congestion is noted slightly more prominent than that seen on the prior exam although no significant effusion or edema is noted. No bony abnormality is noted. IMPRESSION: Mild vascular congestion without significant edema or  effusion. Electronically Signed   By: Inez Catalina M.D.   On: 07/26/2018 16:59     Scheduled Meds: . allopurinol  100 mg Oral Daily  . bisoprolol  10 mg Oral Daily  . diltiazem  120 mg Oral Daily  . furosemide  80 mg Intravenous BID  . levothyroxine  50 mcg Oral QAC breakfast  . loratadine  10 mg Oral Daily  . magnesium oxide  400 mg Oral Daily  . mouth rinse  15 mL Mouth Rinse BID  . mometasone-formoterol  2 puff Inhalation BID  . pantoprazole  40 mg Oral Daily  . potassium chloride  20 mEq Oral BID  . rOPINIRole  3 mg Oral BID  . rosuvastatin  10 mg Oral QHS  . sodium chloride flush  3 mL Intravenous Q12H  . Warfarin - Pharmacist Dosing Inpatient   Does not apply Q24H   Continuous Infusions: . sodium chloride       LOS: 0 days   Time Spent in minutes   30 minutes  Trivia Heffelfinger D.O. on 07/27/2018 at 11:45 AM  Between 7am to 7pm - Please see pager noted on [email or web address]  After 7pm go to [email or web address]  And look for the night coverage person covering for me after hours  [facility] Office  [phone]

## 2018-07-27 NOTE — Progress Notes (Signed)
ANTICOAGULATION CONSULT NOTE - Initial Consult  Pharmacy Consult for Warfarin Indication: atrial fibrillation  Allergies  Allergen Reactions  . Penicillins Hives, Itching, Swelling and Rash    Has patient had a PCN reaction causing immediate rash, facial/tongue/throat swelling, SOB or lightheadedness with hypotension: Yes Has patient had a PCN reaction causing severe rash involving mucus membranes or skin necrosis: Yes Has patient had a PCN reaction that required hospitalization: unknown Has patient had a PCN reaction occurring within the last 10 years: No If all of the above answers are "NO", then may proceed with Cephalosporin use.    Throat swelling    Patient Measurements: Height: 5\' 8"  (172.7 cm) Weight: 204 lb 12.9 oz (92.9 kg) IBW/kg (Calculated) : 63.9   Vital Signs: Temp: 97.6 F (36.4 C) (10/08 0608) Temp Source: Oral (10/08 0608) BP: 99/67 (10/08 8119) Pulse Rate: 73 (10/08 0608)  Labs: Recent Labs    07/26/18 1618 07/26/18 1924 07/27/18 0115  HGB 9.9*  --  9.2*  HCT 32.4*  --  30.6*  PLT 322  --  279  LABPROT 33.5*  --  31.6*  INR 3.33  --  3.08  CREATININE 1.83*  --  1.75*  TROPONINI 0.03* <0.03 <0.03    Estimated Creatinine Clearance: 37.7 mL/min (A) (by C-G formula based on SCr of 1.75 mg/dL (H)).   Medical History: Past Medical History:  Diagnosis Date  . Acute on chronic diastolic (congestive) heart failure ([place])   . Anxiety disorder   . Aortic stenosis    Status post St. Jude mechanical AVR 2007  . Asthma   . Atrial fibrillation ([place])   . Carcinoid tumor of colon 2007  . Coronary atherosclerosis of native coronary artery    Status post CABG 2007  . GERD (gastroesophageal reflux disease)   . History of colonoscopy 2003   Dr. Gala Romney - normal  . Hyperlipidemia   . Macromastia   . Pedal edema    Bilateral, chronic  . Peptic stricture of esophagus 10/04/2010   GE junction on last EGD by Dr. Gala Romney, benign biopsies  . RLS (restless legs  syndrome)   . Schatzki's ring     Medications:  Medications Prior to Admission  Medication Sig Dispense Refill Last Dose  . albuterol (PROVENTIL HFA;VENTOLIN HFA) 108 (90 BASE) MCG/ACT inhaler Inhale 1-2 puffs into the lungs every 6 (six) hours as needed for wheezing or shortness of breath.    07/26/2018 at Unknown time  . allopurinol (ZYLOPRIM) 100 MG tablet Take 100 mg by mouth daily.   07/26/2018 at Unknown time  . ALPRAZolam (XANAX) 0.25 MG tablet Take 1 tablet (0.25 mg total) by mouth 2 (two) times daily as needed for anxiety. (Patient taking differently: Take 1 mg by mouth 2 (two) times daily as needed for anxiety. ) 10 tablet 0 07/26/2018 at Unknown time  . amoxicillin (AMOXIL) 500 MG capsule Take 500 mg by mouth 2 (two) times daily.   07/26/2018 at Unknown time  . benzonatate (TESSALON) 200 MG capsule Take 1 capsule by mouth 3 (three) times daily as needed for cough.   1 07/26/2018 at Unknown time  . bisoprolol (ZEBETA) 10 MG tablet Take 10 mg by mouth daily.   07/26/2018 at [place]  . budesonide-formoterol (SYMBICORT) 160-4.5 MCG/ACT inhaler Inhale 2 puffs into the lungs 2 (two) times daily. 1 Inhaler 3 07/26/2018 at Unknown time  . calcium carbonate (OS-CAL) 600 MG TABS tablet Take 600 mg by mouth 2 (two) times daily.   07/26/2018  at Unknown time  . cetirizine (ZYRTEC) 10 MG tablet Take 10 mg by mouth daily.     07/26/2018 at Unknown time  . CRESTOR 10 MG tablet Take 10 mg by mouth at bedtime.    07/25/2018 at Unknown time  . cyclobenzaprine (FLEXERIL) 10 MG tablet Take 10 mg by mouth daily as needed for muscle spasms.   unknown at Unknown time  . diltiazem (CARDIZEM CD) 120 MG 24 hr capsule Take 1 capsule (120 mg total) by mouth daily. 30 capsule 2 07/26/2018 at Unknown time  . furosemide (LASIX) 80 MG tablet Take 1 tablet (80 mg total) by mouth 2 (two) times daily. 60 tablet 1 07/26/2018 at Unknown time  . HYDROcodone-acetaminophen (NORCO/VICODIN) 5-325 MG tablet Take 1 tablet by mouth daily as  needed for moderate pain. Normally takes one tablet at bedtime for back pain   07/25/2018 at Unknown time  . levothyroxine (SYNTHROID, LEVOTHROID) 50 MCG tablet Take 50 mcg by mouth daily before breakfast.    07/26/2018 at Unknown time  . magnesium oxide (MAG-OX) 400 MG tablet Take 400 mg by mouth daily.   07/26/2018 at Unknown time  . Multiple Vitamin (MULTIVITAMIN WITH MINERALS) TABS tablet Take 1 tablet by mouth daily.   07/26/2018 at Unknown time  . omeprazole (PRILOSEC) 20 MG capsule Take 1 capsule (20 mg total) by mouth 2 (two) times daily before a meal. 60 capsule 5 07/26/2018 at Unknown time  . OXYGEN Inhale 2 L into the lungs daily.    07/26/2018 at Unknown time  . potassium chloride (KLOR-CON) 20 MEQ packet Take 20 mEq by mouth 2 (two) times daily.    07/26/2018 at Unknown time  . rOPINIRole (REQUIP) 3 MG tablet Take 3 mg by mouth 2 (two) times daily.    07/26/2018 at Unknown time  . temazepam (RESTORIL) 30 MG capsule Take 30 mg by mouth at bedtime as needed for sleep.    Past Week at Unknown time  . warfarin (COUMADIN) 2 MG tablet Takes one tablet (2mg ) everyday (Patient taking differently: Take 3-6 mg by mouth See admin instructions. Take 3 tablets (6mg  total) on Tuesdays only. Take 1.5 (3mg  total) tablets on all other days in the evening) 45 tablet 3 07/25/2018 at 1800  . doxycycline (VIBRA-TABS) 100 MG tablet Take 100 mg by mouth 2 (two) times daily.   Completed Course at Unknown time  . predniSONE (DELTASONE) 10 MG tablet Take 6 tablets (60 mg total) by mouth daily with breakfast. And decrease by one tablet daily (Patient not taking: Reported on 07/26/2018) 21 tablet 0 Not Taking at Unknown time    Assessment: Pharmacy consulted to dose warfarin in patient with atrial fibrillation.  Patient's INR is currently supratherapeutic at 3.08.  Home dose is listed as 4 mg on Tues and 3 mg ROW.  Goal of Therapy:  INR 2-3   Plan:  No warfarin this evening. Daily PT/INR. Monitor for signs and  symptoms of bleeding.   Revonda Standard Avrey Flanagin 07/27/2018,8:25 AM

## 2018-07-27 NOTE — Consult Note (Addendum)
Cardiology Consultation:   Patient ID: [name]; [id number]; September 23, 1951   Admit date: 07/26/2018 Date of Consult: 07/27/2018  Primary Care Provider: Octavio Graves, DO Primary Cardiologist: Rozann Lesches, MD   Patient Profile:   [name] is a 67 y.o. female with a hx of D-CHF, AS s/p mech AVR, CABG 2007, GERD, HLD, PAF, asthma, who is being seen today for the evaluation of CHF at the request of Dr Ree Kida.  History of Present Illness:   [name] was admitted 8/31-06/22/2018 for shortness of breath, shortness of breath felt secondary to CHF and COPD exacerbation, weight at discharge 193 pounds.  She notes notes her weight has been going up for a month. She tries to eat low-sodium, not much restaurant or prepared food. She may be drinking more than 2 quarts of liquids daily.   She started noticing increasing DOE. Denies orthopnea, but will start coughing when she lays down. Does not know if GERD is making her cough. Denies PND.   She has been wheezing more than usual. However, she feels her breathing is about as usual.   She has been coughing for over a year, ever since she had PNA.  She has not had chest pain.   She saw Dr Melina Copa for her back, and was sent to the hospital.   She is unaware of the atrial fib. No presyncope or syncope  No bleeding issues on the coumadin.   Has OSA, has CPAP, does not use because she feels it is suffocating her.    Past Medical History:  Diagnosis Date  . Acute on chronic diastolic (congestive) heart failure ([place])   . Anxiety disorder   . Aortic stenosis    Status post St. Jude mechanical AVR 2007  . Asthma   . Atrial fibrillation ([place])   . Carcinoid tumor of colon 2007  . Coronary atherosclerosis of native coronary artery    Status post CABG 2007  . GERD (gastroesophageal reflux disease)   . History of colonoscopy 2003   Dr. Gala Romney - normal  . Hyperlipidemia   . Macromastia   . Pedal edema    Bilateral, chronic  .  Peptic stricture of esophagus 10/04/2010   GE junction on last EGD by Dr. Gala Romney, benign biopsies  . RLS (restless legs syndrome)   . Schatzki's ring     Past Surgical History:  Procedure Laterality Date  . ABDOMINAL HYSTERECTOMY    . AORTIC VALVE REPLACEMENT  2007   #25 mm St. 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 APPENDECTOMY  2007  . BACK SURGERY     lumbar 4 and 5   . BIOPSY  02/05/2012   RMR:Two tongues of salmon-colored epithelium distal esophagus, very short-segment Barrett's s/p bx/Small hiatal hernia, otherwise normal stomach, D1, D2. Status post esophageal dilation. Biopsy showed GERD.  . Breast cyst removed     bilateral  . BREAST REDUCTION SURGERY    . CESAREAN SECTION    . 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 COLONOSCOPY  02/05/2012   RCV:ELFYBO rectum, sigmoid diverticulosis,descending colon polyp , tubular adenoma  . CORONARY ARTERY BYPASS GRAFT     06/2006 - RIMA to RCA, SVG to RCA  . ESOPHAGOGASTRODUODENOSCOPY  10/03/2010   Dr. Gala Romney- schatzki's ring, shoft peptic stricture at GE junction.  . ESOPHAGOGASTRODUODENOSCOPY (EGD) WITH PROPOFOL N/A 02/08/2018   Procedure: ESOPHAGOGASTRODUODENOSCOPY (EGD) WITH PROPOFOL;  Surgeon: Daneil Dolin, MD;  [facility]:  [facility];  Service: Endoscopy;  Laterality: N/A;  8:15am  . FOOT SURGERY     bilateral bunionectomy  . HERNIA REPAIR     with mesh  . LAPAROTOMY  2007   small bowel resection secondary to small bowel obstruction  . MALONEY DILATION  02/05/2012   Procedure: Venia Minks DILATION;  Surgeon: Daneil Dolin, MD;  Location: [facility];  Service: Endoscopy;  Laterality: N/A;  57mm   . MALONEY DILATION N/A 02/08/2018   Procedure: Venia Minks DILATION;  Surgeon: Daneil Dolin, MD;  Location: [facility];  Service: Endoscopy;  Laterality: N/A;  . Teeth removal       Prior to Admission medications   Medication Sig Start Date End Date Taking? Authorizing  Provider  albuterol (PROVENTIL HFA;VENTOLIN HFA) 108 (90 BASE) MCG/ACT inhaler Inhale 1-2 puffs into the lungs every 6 (six) hours as needed for wheezing or shortness of breath.    Yes [provider]  allopurinol (ZYLOPRIM) 100 MG tablet Take 100 mg by mouth daily.   Yes [provider]  ALPRAZolam (XANAX) 0.25 MG tablet Take 1 tablet (0.25 mg total) by mouth 2 (two) times daily as needed for anxiety. Patient taking differently: Take 1 mg by mouth 2 (two) times daily as needed for anxiety.  05/05/18  Yes Johnson, Clanford L, MD  amoxicillin (AMOXIL) 500 MG capsule Take 500 mg by mouth 2 (two) times daily.   Yes [provider]  benzonatate (TESSALON) 200 MG capsule Take 1 capsule by mouth 3 (three) times daily as needed for cough.  06/14/18  Yes [provider]  bisoprolol (ZEBETA) 10 MG tablet Take 10 mg by mouth daily.   Yes [provider]  budesonide-formoterol (SYMBICORT) 160-4.5 MCG/ACT inhaler Inhale 2 puffs into the lungs 2 (two) times daily. 03/31/18  Yes Barton Dubois, MD  calcium carbonate (OS-CAL) 600 MG TABS tablet Take 600 mg by mouth 2 (two) times daily.   Yes [provider]  cetirizine (ZYRTEC) 10 MG tablet Take 10 mg by mouth daily.     Yes [provider]  CRESTOR 10 MG tablet Take 10 mg by mouth at bedtime.  12/02/13  Yes [provider]  cyclobenzaprine (FLEXERIL) 10 MG tablet Take 10 mg by mouth daily as needed for muscle spasms.   Yes [provider]  diltiazem (CARDIZEM CD) 120 MG 24 hr capsule Take 1 capsule (120 mg total) by mouth daily. 04/01/18  Yes Barton Dubois, MD  furosemide (LASIX) 80 MG tablet Take 1 tablet (80 mg total) by mouth 2 (two) times daily. 06/22/18  Yes Tat, Shanon Brow, MD  HYDROcodone-acetaminophen (NORCO/VICODIN) 5-325 MG tablet Take 1 tablet by mouth daily as needed for moderate pain. Normally takes one tablet at bedtime for back pain   Yes [provider]  levothyroxine  (SYNTHROID, LEVOTHROID) 50 MCG tablet Take 50 mcg by mouth daily before breakfast.    Yes [provider]  magnesium oxide (MAG-OX) 400 MG tablet Take 400 mg by mouth daily.   Yes [provider]  Multiple Vitamin (MULTIVITAMIN WITH MINERALS) TABS tablet Take 1 tablet by mouth daily.   Yes [provider]  omeprazole (PRILOSEC) 20 MG capsule Take 1 capsule (20 mg total) by mouth 2 (two) times daily before a meal. 07/22/18  Yes Mahala Menghini, PA-C  OXYGEN Inhale 2 L into the lungs daily.    Yes [provider]  potassium chloride (KLOR-CON) 20 MEQ packet Take 20 mEq by mouth 2 (  two) times daily.    Yes [provider]  rOPINIRole (REQUIP) 3 MG tablet Take 3 mg by mouth 2 (two) times daily.    Yes [provider]  temazepam (RESTORIL) 30 MG capsule Take 30 mg by mouth at bedtime as needed for sleep.  06/24/18  Yes [provider]  warfarin (COUMADIN) 2 MG tablet Takes one tablet (2mg ) everyday Patient taking differently: Take 3-6 mg by mouth See admin instructions. Take 3 tablets (6mg  total) on Tuesdays only. Take 1.5 (3mg  total) tablets on all other days in the evening 05/08/18  Yes Johnson, Clanford L, MD  doxycycline (VIBRA-TABS) 100 MG tablet Take 100 mg by mouth 2 (two) times daily. 07/12/18   [provider]  predniSONE (DELTASONE) 10 MG tablet Take 6 tablets (60 mg total) by mouth daily with breakfast. And decrease by one tablet daily Patient not taking: Reported on 07/26/2018 06/23/18   Orson Eva, MD    Inpatient Medications: Scheduled Meds: . allopurinol  100 mg Oral Daily  . bisoprolol  10 mg Oral Daily  . diltiazem  120 mg Oral Daily  . furosemide  80 mg Intravenous BID  . levothyroxine  50 mcg Oral QAC breakfast  . loratadine  10 mg Oral Daily  . magnesium oxide  400 mg Oral Daily  . mouth rinse  15 mL Mouth Rinse BID  . mometasone-formoterol  2 puff Inhalation BID  . pantoprazole  40 mg Oral Daily  . potassium  chloride  20 mEq Oral BID  . rOPINIRole  3 mg Oral BID  . rosuvastatin  10 mg Oral QHS  . sodium chloride flush  3 mL Intravenous Q12H  . Warfarin - Pharmacist Dosing Inpatient   Does not apply Q24H   Continuous Infusions: . sodium chloride     PRN Meds: sodium chloride, acetaminophen **OR** acetaminophen, albuterol, ALPRAZolam, benzonatate, cyclobenzaprine, HYDROcodone-acetaminophen, sodium chloride flush, temazepam  Allergies:    Allergies  Allergen Reactions  . Penicillins Hives, Itching, Swelling and Rash    Has patient had a PCN reaction causing immediate rash, facial/tongue/throat swelling, SOB or lightheadedness with hypotension: Yes Has patient had a PCN reaction causing severe rash involving mucus membranes or skin necrosis: Yes Has patient had a PCN reaction that required hospitalization: unknown Has patient had a PCN reaction occurring within the last 10 years: No If all of the above answers are "NO", then may proceed with Cephalosporin use.    Throat swelling    Social History:   Social History   Socioeconomic History  . Marital status: Married    Spouse name: Not on file  . Number of children: Not on file  . Years of education: Not on file  . Highest education level: Not on file  Occupational History  . Occupation: Retired  Scientific laboratory technician  . Financial resource strain: Not on file  . Food insecurity:    Worry: Not on file    Inability: Not on file  . Transportation needs:    Medical: Not on file    Non-medical: Not on file  Tobacco Use  . Smoking status: Former Smoker    Packs/day: 1.00    Years: 20.00    Pack years: 20.00    Types: Cigarettes    Last attempt to quit: 10/20/1988    Years since quitting: 29.7  . Smokeless tobacco: Never Used  Substance and Sexual Activity  . Alcohol use: No    Alcohol/week: 0.0 standard drinks  . Drug use: No  .  Sexual activity: Yes    Partners: Male    Birth control/protection: None    Comment: spouse  Lifestyle    . Physical activity:    Days per week: Not on file    Minutes per session: Not on file  . Stress: Not on file  Relationships  . Social connections:    Talks on phone: Not on file    Gets together: Not on file    Attends religious service: Not on file    Active member of club or organization: Not on file    Attends meetings of clubs or organizations: Not on file    Relationship status: Not on file  . Intimate partner violence:    Fear of current or ex partner: Not on file    Emotionally abused: Not on file    Physically abused: Not on file    Forced sexual activity: Not on file  Other Topics Concern  . Not on file  Social History Narrative  . Not on file    Family History:   Family History  Problem Relation Age of Onset  . Stroke Mother   . Cancer Father   . Anesthesia problems Neg Hx   . Hypotension Neg Hx   . Malignant hyperthermia Neg Hx   . Pseudochol deficiency Neg Hx    Family Status:  Family Status  Relation Name Status  . Mother  Deceased       cva  . Father  Deceased       cancer type unknown  . Neg Hx  (Not Specified)    ROS:  Please see the history of present illness.  All other ROS reviewed and negative.     Physical Exam/Data:   Vitals:   07/26/18 2032 07/27/18 0510 07/27/18 0608 07/27/18 0852  BP: 108/71  99/67 97/68  Pulse: 72  73 82  Resp: 20  20   Temp: 97.8 F (36.6 C)  97.6 F (36.4 C)   TempSrc: Oral  Oral   SpO2: 97% 98% 100%   Weight:      Height:        Intake/Output Summary (Last 24 hours) at 07/27/2018 1015 Last data filed at 07/27/2018 0300 Gross per 24 hour  Intake -  Output 400 ml  Net -400 ml   Filed Weights   07/26/18 1554 07/26/18 1945  Weight: 91 kg 92.9 kg   Body mass index is 31.14 kg/m.  General:  Well nourished, well developed, in no acute distress HEENT: normal Lymph: no adenopathy Neck: no JVD seen, difficult to assess 2nd body habitus Endocrine:  No thryomegaly Vascular: No carotid bruits; 4/4  extremity pulses 2+, without bruits  Cardiac:  normal S1, S2; Irreg R&R; crisp valve click, 2/6 murmur  Lungs:  clear to auscultation bilaterally, + wheezing, some rhonchi, + rales  Abd: soft, nontender, no hepatomegaly  Ext: trace-1+ edema Musculoskeletal:  No deformities, BUE and BLE strength normal and equal Skin: warm and dry, chronic dermatitis both LE Neuro:  CNs 2-12 intact, no focal abnormalities noted Psych:  Normal affect   EKG:  The EKG was personally reviewed and demonstrates:  10/07, atrial fib, HR 73, no acute ischemic changes Telemetry:  Telemetry was personally reviewed and demonstrates:  Atrial fib, rate controlled  Relevant CV Studies:  ECHO: ordered ECHO: 03/25/2018 - Left ventricle: The cavity size was normal. Wall thickness was   increased in a pattern of mild LVH. Systolic function was   vigorous. The estimated ejection  fraction was in the range of 65%   to 70%. Wall motion was normal; there were no regional wall   motion abnormalities. The study was not technically sufficient to   allow evaluation of LV diastolic dysfunction due to atrial   fibrillation. - Aortic valve: A St. Jude [employer] mechanical prosthesis was   present. There was trivial regurgitation. Mean gradient (S): 12   mm Hg. Valve area (VTI): 1.42 cm^2. - Mitral valve: Moderately calcified annulus. There was mild   regurgitation. - Left atrium: The atrium was at the upper limits of normal in   size. - Right atrium: Central venous pressure (est): 15 mm Hg. - Tricuspid valve: There was mild regurgitation. - Pulmonary arteries: PA peak pressure: 44 mm Hg (S). - Pericardium, extracardiac: There was no pericardial effusion.  CATH: none since CABG ANGIOGRAPHIC FINDINGS:  1. The left main coronary artery arises high on the left coronary cusp and is free of significant flow-limiting coronary atherosclerosis.  This      vessel gives rise to the left anterior descending and circumflex      coronary  arteries.  2. The left anterior descending is a medium caliber vessel with      calcification noted proximally and one large bifurcating diagonal      branch.  There is approximately 50% focal stenosis in the proximal      portion of the left anterior descending and otherwise minor luminal      irregularities.  3. The circumflex coronary artery is a medium caliber vessel with two      obtuse marginal branches, the second of which is fairly large and has multiple branches.  No significant flow-limiting coronary atherosclerosis is noted.  4. The right coronary artery is a medium caliber vessel that is dominant.      There was a fairly large right ventricular marginal branch, posterior      descending branch, posterolateral branches.  There is a relatively      complex-appearing 90% stenosis in the midportion of the right coronary artery.  5. Aortic root injection reveals significant aneurysmal dilatation of the      ascending aorta, as well as at least 2+ aortic regurgitation.  There is      calcification noted of the aortic valve.   Left ventriculography was performed in the RAO projection and revealed an ejection fraction of approximately 65% without focal anterior or inferior wall motion abnormality and no significant mitral regurgitation.   DIAGNOSES:  1. Coronary artery disease as outlined including a 90% complex stenosis of      the mid right coronary artery, as well as a 50% proximal left anterior      descending stenosis.  2. Aortic stenosis, likely moderate, with a mean transvalvular gradient of      28 mmHg and calcification noted radiographically.  Right heart      catheterization was not performed due to difficulty obtaining venous      access.  A valve area was, therefore, not calculated.  3. Ascending aortic aneurysm  4. At least 2+ aortic regurgitation with overall normal left ejection      fraction of 65% and no focal wall motion abnormality.   DISCUSSION:  I reviewed  results with the patient, her family and with Dr. Dannielle Burn by phone.  At this point, she is not ready to commit to a surgical repair and would like to consider the matter further.  She is having exertional shortness of breath without  rest symptoms at this time.  I reviewed the situation with her, and the plan at this point will be for her to follow-up with Dr. Dannielle Burn next week in our [place] office to discuss the matter further.  She would ultimately need bypass grafting with aortic valve replacement and aneurysm repair.  Laboratory Data:  Chemistry Recent Labs  Lab 07/26/18 1618 07/27/18 0115  NA 133* 134*  K 2.9* 3.4*  CL 94* 97*  CO2 29 27  GLUCOSE 134* 140*  BUN 25* 26*  CREATININE 1.83* 1.75*  CALCIUM 8.6* 8.4*  GFRNONAA 28* 29*  GFRAA 32* 34*  ANIONGAP 10 10    Lab Results  Component Value Date   ALT 19 07/27/2018   AST 34 07/27/2018   ALKPHOS 76 07/27/2018   BILITOT 0.9 07/27/2018   Hematology Recent Labs  Lab 07/26/18 1618 07/27/18 0115  WBC 8.7 5.5  RBC 3.79* 3.58*  HGB 9.9* 9.2*  HCT 32.4* 30.6*  MCV 85.5 85.5  MCH 26.1 25.7*  MCHC 30.6 30.1  RDW 17.3* 17.3*  PLT 322 279   Cardiac Enzymes Recent Labs  Lab 07/26/18 1618 07/26/18 1924 07/27/18 0115 07/27/18 0757  TROPONINI 0.03* <0.03 <0.03 <0.03    Recent Labs  Lab 07/26/18 1625  TROPIPOC 0.02    BNP Recent Labs  Lab 07/26/18 1606  BNP 813.0*    TSH:  Lab Results  Component Value Date   TSH 2.449 03/05/2017   Magnesium:  Magnesium  Date Value Ref Range Status  07/26/2018 1.9 1.7 - 2.4 mg/dL Final    Comment:    Performed at [facility], [address], [place], [place] [address]     Radiology/Studies:  Dg Chest Port 1 View  Result Date: 07/26/2018 CLINICAL DATA:  Shortness of breath and cough EXAM: PORTABLE CHEST 1 VIEW COMPARISON:  06/19/2018 FINDINGS: Cardiac shadow remains enlarged. Postsurgical changes are again seen. Mild vascular congestion is noted slightly more prominent than  that seen on the prior exam although no significant effusion or edema is noted. No bony abnormality is noted. IMPRESSION: Mild vascular congestion without significant edema or effusion. Electronically Signed   By: Inez Catalina M.D.   On: 07/26/2018 16:59    Assessment and Plan:   Principal Problem: 1.  CHF (congestive heart failure) (HCC) - Continue diuresis, track intake/output and daily weights -Weight is up about 10 pounds from her discharge 06/22/2018 -Patient denies sodium noncompliance but admits to fluid noncompliance. - However, because of her COPD/asthma, respiratory status is poor at baseline - She has some volume overload by exam, continue diuresis. -Follow-up on echo results  Active Problems: 2.  Coronary atherosclerosis of native coronary artery -Continue bisoprolol and rosuvastatin.  No aspirin because of Coumadin.  3.  Mechanical aortic valve: -Follow-up on echo, but she has a crisp valve click by exam  4.  Persistent atrial fibrillation ([place]) -She is unaware of the atrial fibrillation, it is on all of her ECGs for several months -Rate is generally controlled, on bisoprolol and diltiazem - Continue anticoagulation  5.  Anemia -Hemoglobin between 9 and 10 is stable for her  6.  Hypokalemia -Supplemented per IM, improved  7.  Elevated troponin - Minimal elevation in troponin is consistent with CHF, more so than ACS. - No ischemic symptoms - No ischemic eval planned at this time   For questions or updates, please contact [place] Please consult [email or web address] for contact info under Cardiology/STEMI.   Jonetta Speak, PA-C  07/27/2018 10:15  AM    Attending note:  Patient seen and examined.  Her history is well-known to me.  I discussed the case with Ms. Ahmed Prima PA-C.  [name] is currently admitted to the hospital with weight gain and suspected acute on chronic diastolic heart failure.  She had a similar presentation back in July and also  September.  Reports compliance with diuretics, but it seems like fluid restriction has not been followed carefully.  She is at least 10 pounds over her previous "dry" weight.  She is short of breath and has wheezing at rest.  Chest x-ray shows mild vascular congestion without effusions.  No infiltrates.  She is in atrial fibrillation with controlled heart rates in the 70s to 80s by telemetry which I personally reviewed.  Systolic blood pressure is in the 90s.  She is not hypoxic.  Mechanical aortic prosthetic click is normal.  She does have prolonged expiratory phase.  Also leg edema and venous stasis.  Lab work shows potassium 3.4, BUN 26, creatinine 1.75, troponin I levels negative x3, hemoglobin 9.2.  INR is 3.08.  Acute on chronic diastolic heart failure.  Currently on Lasix 80 mg IV twice daily.  Urine output was not accurately recorded, would place Foley catheter.  Follow-up echocardiogram is being obtained.  Recheck BMET in a.m.  Otherwise continue outpatient cardiac regimen including Coumadin per pharmacy.  Satira Sark, M.D., F.A.C.C.

## 2018-07-27 NOTE — Progress Notes (Signed)
*  PRELIMINARY RESULTS* Echocardiogram 2D Echocardiogram has been performed.  [name] 07/27/2018, 2:19 PM

## 2018-07-28 ENCOUNTER — Encounter (HOSPITAL_COMMUNITY): Payer: Self-pay | Admitting: *Deleted

## 2018-07-28 DIAGNOSIS — J9621 Acute and chronic respiratory failure with hypoxia: Secondary | ICD-10-CM

## 2018-07-28 DIAGNOSIS — J9622 Acute and chronic respiratory failure with hypercapnia: Secondary | ICD-10-CM

## 2018-07-28 DIAGNOSIS — I4821 Permanent atrial fibrillation: Secondary | ICD-10-CM

## 2018-07-28 DIAGNOSIS — J441 Chronic obstructive pulmonary disease with (acute) exacerbation: Secondary | ICD-10-CM

## 2018-07-28 DIAGNOSIS — N183 Chronic kidney disease, stage 3 (moderate): Secondary | ICD-10-CM

## 2018-07-28 DIAGNOSIS — E876 Hypokalemia: Secondary | ICD-10-CM

## 2018-07-28 DIAGNOSIS — R7989 Other specified abnormal findings of blood chemistry: Secondary | ICD-10-CM

## 2018-07-28 LAB — BASIC METABOLIC PANEL
Anion gap: 9 (ref 5–15)
BUN: 29 mg/dL — AB (ref 8–23)
CHLORIDE: 96 mmol/L — AB (ref 98–111)
CO2: 31 mmol/L (ref 22–32)
CREATININE: 1.68 mg/dL — AB (ref 0.44–1.00)
Calcium: 8.8 mg/dL — ABNORMAL LOW (ref 8.9–10.3)
GFR calc Af Amer: 36 mL/min — ABNORMAL LOW (ref >60)
GFR calc non Af Amer: 31 mL/min — ABNORMAL LOW (ref >60)
GLUCOSE: 99 mg/dL (ref 70–99)
POTASSIUM: 3.3 mmol/L — AB (ref 3.5–5.1)
SODIUM: 136 mmol/L (ref 135–145)

## 2018-07-28 LAB — CBC
HCT: 30.2 % — ABNORMAL LOW (ref 36.0–46.0)
HEMOGLOBIN: 8.9 g/dL — AB (ref 12.0–15.0)
MCH: 25.7 pg — AB (ref 26.0–34.0)
MCHC: 29.5 g/dL — AB (ref 30.0–36.0)
MCV: 87.3 fL (ref 80.0–100.0)
Platelets: 281 10*3/uL (ref 150–400)
RBC: 3.46 MIL/uL — AB (ref 3.87–5.11)
RDW: 17.6 % — ABNORMAL HIGH (ref 11.5–15.5)
WBC: 10.6 10*3/uL — ABNORMAL HIGH (ref 4.0–10.5)
nRBC: 0 % (ref 0.0–0.2)

## 2018-07-28 LAB — PROTIME-INR
INR: 3.28
Prothrombin Time: 33.2 seconds — ABNORMAL HIGH (ref 11.4–15.2)

## 2018-07-28 MED ORDER — BUDESONIDE 0.5 MG/2ML IN SUSP
0.5000 mg | Freq: Two times a day (BID) | RESPIRATORY_TRACT | Status: DC
  Administered 2018-07-28 – 2018-08-02 (×10): 0.5 mg via RESPIRATORY_TRACT
  Filled 2018-07-28 (×10): qty 2

## 2018-07-28 MED ORDER — IPRATROPIUM-ALBUTEROL 0.5-2.5 (3) MG/3ML IN SOLN
3.0000 mL | Freq: Four times a day (QID) | RESPIRATORY_TRACT | Status: DC
  Administered 2018-07-28 – 2018-07-29 (×5): 3 mL via RESPIRATORY_TRACT
  Filled 2018-07-28 (×5): qty 3

## 2018-07-28 MED ORDER — POTASSIUM CHLORIDE CRYS ER 20 MEQ PO TBCR
40.0000 meq | EXTENDED_RELEASE_TABLET | Freq: Once | ORAL | Status: AC
  Administered 2018-07-28: 40 meq via ORAL
  Filled 2018-07-28: qty 2

## 2018-07-28 MED ORDER — METHYLPREDNISOLONE SODIUM SUCC 125 MG IJ SOLR
60.0000 mg | Freq: Four times a day (QID) | INTRAMUSCULAR | Status: DC
  Administered 2018-07-28 – 2018-07-31 (×12): 60 mg via INTRAVENOUS
  Filled 2018-07-28 (×13): qty 2

## 2018-07-28 NOTE — Progress Notes (Signed)
ANTICOAGULATION CONSULT NOTE - Initial Consult  Pharmacy Consult for Warfarin Indication: atrial fibrillation  Allergies  Allergen Reactions  . Penicillins Hives, Itching, Swelling and Rash    Has patient had a PCN reaction causing immediate rash, facial/tongue/throat swelling, SOB or lightheadedness with hypotension: Yes Has patient had a PCN reaction causing severe rash involving mucus membranes or skin necrosis: Yes Has patient had a PCN reaction that required hospitalization: unknown Has patient had a PCN reaction occurring within the last 10 years: No If all of the above answers are "NO", then may proceed with Cephalosporin use.    Throat swelling    Patient Measurements: Height: 5\' 8"  (172.7 cm) Weight: 208 lb 5.4 oz (94.5 kg) IBW/kg (Calculated) : 63.9   Vital Signs: Temp: 98 F (36.7 C) (10/09 0547) Temp Source: Oral (10/09 0547) BP: 111/68 (10/09 0547) Pulse Rate: 65 (10/09 0547)  Labs: Recent Labs    07/26/18 1618 07/26/18 1924 07/27/18 0115 07/27/18 0757 07/28/18 0559  HGB 9.9*  --  9.2*  --  8.9*  HCT 32.4*  --  30.6*  --  30.2*  PLT 322  --  279  --  281  LABPROT 33.5*  --  31.6*  --  33.2*  INR 3.33  --  3.08  --  3.28  CREATININE 1.83*  --  1.75*  --  1.68*  TROPONINI 0.03* <0.03 <0.03 <0.03  --     Estimated Creatinine Clearance: 39.6 mL/min (A) (by C-G formula based on SCr of 1.68 mg/dL (H)).   Medical History: Past Medical History:  Diagnosis Date  . Acute on chronic diastolic (congestive) heart failure ([place])   . Anxiety disorder   . Aortic stenosis    Status post St. Jude mechanical AVR 2007  . Asthma   . Atrial fibrillation ([place])   . Carcinoid tumor of colon 2007  . Coronary atherosclerosis of native coronary artery    Status post CABG 2007  . GERD (gastroesophageal reflux disease)   . History of colonoscopy 2003   Dr. Gala Romney - normal  . Hyperlipidemia   . Macromastia   . Pedal edema    Bilateral, chronic  . Peptic stricture of  esophagus 10/04/2010   GE junction on last EGD by Dr. Gala Romney, benign biopsies  . RLS (restless legs syndrome)   . Schatzki's ring     Medications:  Medications Prior to Admission  Medication Sig Dispense Refill Last Dose  . albuterol (PROVENTIL HFA;VENTOLIN HFA) 108 (90 BASE) MCG/ACT inhaler Inhale 1-2 puffs into the lungs every 6 (six) hours as needed for wheezing or shortness of breath.    07/26/2018 at Unknown time  . allopurinol (ZYLOPRIM) 100 MG tablet Take 100 mg by mouth daily.   07/26/2018 at Unknown time  . ALPRAZolam (XANAX) 0.25 MG tablet Take 1 tablet (0.25 mg total) by mouth 2 (two) times daily as needed for anxiety. (Patient taking differently: Take 1 mg by mouth 2 (two) times daily as needed for anxiety. ) 10 tablet 0 07/26/2018 at Unknown time  . amoxicillin (AMOXIL) 500 MG capsule Take 500 mg by mouth 2 (two) times daily.   07/26/2018 at Unknown time  . benzonatate (TESSALON) 200 MG capsule Take 1 capsule by mouth 3 (three) times daily as needed for cough.   1 07/26/2018 at Unknown time  . bisoprolol (ZEBETA) 10 MG tablet Take 10 mg by mouth daily.   07/26/2018 at [place]  . budesonide-formoterol (SYMBICORT) 160-4.5 MCG/ACT inhaler Inhale 2 puffs into the lungs  2 (two) times daily. 1 Inhaler 3 07/26/2018 at Unknown time  . calcium carbonate (OS-CAL) 600 MG TABS tablet Take 600 mg by mouth 2 (two) times daily.   07/26/2018 at Unknown time  . cetirizine (ZYRTEC) 10 MG tablet Take 10 mg by mouth daily.     07/26/2018 at Unknown time  . CRESTOR 10 MG tablet Take 10 mg by mouth at bedtime.    07/25/2018 at Unknown time  . cyclobenzaprine (FLEXERIL) 10 MG tablet Take 10 mg by mouth daily as needed for muscle spasms.   unknown at Unknown time  . diltiazem (CARDIZEM CD) 120 MG 24 hr capsule Take 1 capsule (120 mg total) by mouth daily. 30 capsule 2 07/26/2018 at Unknown time  . furosemide (LASIX) 80 MG tablet Take 1 tablet (80 mg total) by mouth 2 (two) times daily. 60 tablet 1 07/26/2018 at Unknown  time  . HYDROcodone-acetaminophen (NORCO/VICODIN) 5-325 MG tablet Take 1 tablet by mouth daily as needed for moderate pain. Normally takes one tablet at bedtime for back pain   07/25/2018 at Unknown time  . levothyroxine (SYNTHROID, LEVOTHROID) 50 MCG tablet Take 50 mcg by mouth daily before breakfast.    07/26/2018 at Unknown time  . magnesium oxide (MAG-OX) 400 MG tablet Take 400 mg by mouth daily.   07/26/2018 at Unknown time  . Multiple Vitamin (MULTIVITAMIN WITH MINERALS) TABS tablet Take 1 tablet by mouth daily.   07/26/2018 at Unknown time  . omeprazole (PRILOSEC) 20 MG capsule Take 1 capsule (20 mg total) by mouth 2 (two) times daily before a meal. 60 capsule 5 07/26/2018 at Unknown time  . OXYGEN Inhale 2 L into the lungs daily.    07/26/2018 at Unknown time  . potassium chloride (KLOR-CON) 20 MEQ packet Take 20 mEq by mouth 2 (two) times daily.    07/26/2018 at Unknown time  . rOPINIRole (REQUIP) 3 MG tablet Take 3 mg by mouth 2 (two) times daily.    07/26/2018 at Unknown time  . temazepam (RESTORIL) 30 MG capsule Take 30 mg by mouth at bedtime as needed for sleep.    Past Week at Unknown time  . warfarin (COUMADIN) 2 MG tablet Takes one tablet (2mg ) everyday (Patient taking differently: Take 3-6 mg by mouth See admin instructions. Take 3 tablets (6mg  total) on Tuesdays only. Take 1.5 (3mg  total) tablets on all other days in the evening) 45 tablet 3 07/25/2018 at 1800  . doxycycline (VIBRA-TABS) 100 MG tablet Take 100 mg by mouth 2 (two) times daily.   Completed Course at Unknown time  . predniSONE (DELTASONE) 10 MG tablet Take 6 tablets (60 mg total) by mouth daily with breakfast. And decrease by one tablet daily (Patient not taking: Reported on 07/26/2018) 21 tablet 0 Not Taking at Unknown time    Assessment: Pharmacy consulted to dose warfarin in patient with atrial fibrillation.  Patient's INR is currently supratherapeutic at 3.28.  Home dose is listed as 4 mg on Tues and 3 mg ROW.  Goal of  Therapy:  INR 2-3   Plan:  No warfarin this evening. Daily PT/INR. Monitor for signs and symptoms of bleeding.   Revonda Standard Henlee Donovan 07/28/2018,8:24 AM

## 2018-07-28 NOTE — Progress Notes (Addendum)
Progress Note  Patient Name: [name] Date of Encounter: 07/28/2018  Primary Cardiologist:  Rozann Lesches, MD  Subjective   Breathing a little better today, fatigued in general, no chest pain or palpitations.  Inpatient Medications    Scheduled Meds: . allopurinol  100 mg Oral Daily  . bisoprolol  10 mg Oral Daily  . diltiazem  120 mg Oral Daily  . furosemide  80 mg Intravenous BID  . levothyroxine  50 mcg Oral QAC breakfast  . loratadine  10 mg Oral Daily  . magnesium oxide  400 mg Oral Daily  . mouth rinse  15 mL Mouth Rinse BID  . mometasone-formoterol  2 puff Inhalation BID  . pantoprazole  40 mg Oral Daily  . potassium chloride  20 mEq Oral BID  . rOPINIRole  3 mg Oral BID  . rosuvastatin  10 mg Oral QHS  . sodium chloride flush  3 mL Intravenous Q12H  . Warfarin - Pharmacist Dosing Inpatient   Does not apply Q24H   Continuous Infusions: . sodium chloride     PRN Meds: sodium chloride, acetaminophen **OR** acetaminophen, albuterol, ALPRAZolam, benzonatate, cyclobenzaprine, HYDROcodone-acetaminophen, sodium chloride flush, temazepam   Vital Signs    Vitals:   07/28/18 0227 07/28/18 0445 07/28/18 0547 07/28/18 0748  BP:   111/68   Pulse:   65   Resp:      Temp:   98 F (36.7 C)   TempSrc:   Oral   SpO2:  98% 97% 95%  Weight: 94.5 kg     Height:        Intake/Output Summary (Last 24 hours) at 07/28/2018 0911 Last data filed at 07/28/2018 9150 Gross per 24 hour  Intake 480 ml  Output 800 ml  Net -320 ml   Filed Weights   07/26/18 1554 07/26/18 1945 07/28/18 0227  Weight: 91 kg 92.9 kg 94.5 kg    Telemetry    Atrial fibrillation, controlled ventricular rate, no significant ectopy  - Personally Reviewed  ECG    10/7 ECG is atrial fibrillation, heart rate 73, no acute ischemic changes- Personally Reviewed  Physical Exam   General: Overweight female appearing in no acute distress. Head: Normocephalic, atraumatic.  Neck: Supple without  bruits, JVD not able to be assessed secondary to body habitus. Lungs:  Resp regular and unlabored, expiratory wheeze, but less than yesterday, basilar rales. Heart: Irregular rate and rhythm, crisp valve click, S1, S2, no S3, S4, 2/6 murmur; no rub. Abdomen: Soft, non-tender, non-distended with normoactive bowel sounds. No hepatomegaly. No rebound/guarding. No obvious abdominal masses. Extremities: No clubbing, cyanosis, 1+ edema. Distal pedal pulses are 2+ bilaterally. Neuro: Alert and oriented X 3. Moves all extremities spontaneously. Psych: Normal affect.  Labs    Hematology Recent Labs  Lab 07/26/18 1618 07/27/18 0115 07/28/18 0559  WBC 8.7 5.5 10.6*  RBC 3.79* 3.58* 3.46*  HGB 9.9* 9.2* 8.9*  HCT 32.4* 30.6* 30.2*  MCV 85.5 85.5 87.3  MCH 26.1 25.7* 25.7*  MCHC 30.6 30.1 29.5*  RDW 17.3* 17.3* 17.6*  PLT 322 279 281    Chemistry Recent Labs  Lab 07/26/18 1618 07/27/18 0115 07/28/18 0559  NA 133* 134* 136  K 2.9* 3.4* 3.3*  CL 94* 97* 96*  CO2 29 27 31   GLUCOSE 134* 140* 99  BUN 25* 26* 29*  CREATININE 1.83* 1.75* 1.68*  CALCIUM 8.6* 8.4* 8.8*  PROT 7.3 6.4*  --   ALBUMIN 3.3* 3.0*  --   AST 35  34  --   ALT 21 19  --   ALKPHOS 86 76  --   BILITOT 1.1 0.9  --   GFRNONAA 28* 29* 31*  GFRAA 32* 34* 36*  ANIONGAP 10 10 9      Cardiac Enzymes Recent Labs  Lab 07/26/18 1618 07/26/18 1924 07/27/18 0115 07/27/18 0757  TROPONINI 0.03* <0.03 <0.03 <0.03    Recent Labs  Lab 07/26/18 1625  TROPIPOC 0.02     BNP Recent Labs  Lab 07/26/18 1606  BNP 813.0*     Radiology    Dg Chest Port 1 View  Result Date: 07/26/2018 CLINICAL DATA:  Shortness of breath and cough EXAM: PORTABLE CHEST 1 VIEW COMPARISON:  06/19/2018 FINDINGS: Cardiac shadow remains enlarged. Postsurgical changes are again seen. Mild vascular congestion is noted slightly more prominent than that seen on the prior exam although no significant effusion or edema is noted. No bony  abnormality is noted. IMPRESSION: Mild vascular congestion without significant edema or effusion. Electronically Signed   By: Inez Catalina M.D.   On: 07/26/2018 16:59     Cardiac Studies   ECHO: 07/27/2018 - Left ventricle: The cavity size was normal. Wall thickness was   increased in a pattern of mild LVH. Systolic function was normal.   The estimated ejection fraction was in the range of 60% to 65%.   Wall motion was normal; there were no regional wall motion   abnormalities. The study was not technically sufficient to allow   evaluation of LV diastolic dysfunction due to atrial  fibrillation. - Aortic valve: St. Jude mechanical prosthesis in aortic position.   There was no significant regurgitation. Mean gradient (S): 6 mm   Hg. Peak gradient (S): 11 mm Hg. Normal prosthetic function based   on gradients. - Mitral valve: Moderately calcified annulus. There was mild   regurgitation directed eccentrically. - Left atrium: The atrium was severely dilated. - Right ventricle: The cavity size was moderately dilated. Systolic   function was mildly reduced. - Right atrium: The atrium was at the upper limits of normal in   size. Central venous pressure (est): 15 mm Hg. - Atrial septum: No defect or patent foramen ovale was identified. - Tricuspid valve: There was mild-moderate regurgitation. - Pulmonary arteries: PA peak pressure: 39 mm Hg (S). - Pericardium, extracardiac: There was no pericardial effusion.  Patient Profile     67 y.o. female w/ hx D-CHF, AS s/p mech AVR, CABG 2007, GERD, HLD, PAF, asthma,  was admitted 10/7 with CHF exacerbation, card saw 10/8.  Assessment & Plan    Principal Problem: 1.  CHF (congestive heart failure) (HCC) -PAS and CVP are both elevated, continue diuresis -Make sure weights are standing weights -Only 800 cc recorded out, very little urine output recorded during the day so may not be accurate. - With a creatinine of 1.68, she should diuresis on  Lasix 80 mg IV twice daily. -No med changes for now, consider metolazone if urine output does not improve and weight does not decrease  Active Problems: 2.  Coronary atherosclerosis of native coronary artery -No ischemic symptoms - No aspirin because of Coumadin, continue beta-blocker and statin  3.  Atrial fibrillation (HCC) -Rate is controlled on bisoprolol -Continue anticoagulation with Coumadin  4.  Anemia -Per IM  5.  Hypokalemia -Increase supplement and follow  6.  Elevated troponin -Minimal elevation and no crescendo/decrescendo trend -EF normal on echo - No ischemic evaluation indicated  Signed, Rosaria Ferries , PA-C 9:11  AM 07/28/2018 Pager: [phone]   Attending note:  Patient seen and examined.  Case discussed with Ms. Ahmed Prima PA-C.  [name] feels perhaps somewhat better but is still not near baseline in terms of weight.  She continues on IV Lasix at 80 mg twice daily, might consider addition of metolazone depending on progress over the next 24 hours.  Follow-up echocardiogram done yesterday showed LVEF 60 to 65% with normally functioning St. Jude mechanical AVR, severely dilated right atrium, mildly reduced right ventricular contraction, and elevated CVP.  Has prolonged expiratory phase on examination of the lungs.  No major change in leg edema and venous stasis.  Creatinine down to 1.68.  Continue with current regimen.  Satira Sark, M.D., F.A.C.C.

## 2018-07-28 NOTE — Progress Notes (Signed)
PROGRESS NOTE  [name]:[id number] DOB: 1951-09-23 DOA: 07/26/2018 PCP: Octavio Graves, DO  Brief History:  67 y.o.femalewith medical history ofdiastolic CHF, CAD, aortic stenosis with history of AVR in 2007, asthma, chronic atrial fibrillation, history of carcinoid tumor, anxiety disorder, GERD, hyperlipidemia, PUD with stricture of the esophagus at the GE junction level, Schatzki's ring, restless leg syndrome, sleep apnea not using CPAPpresenting with7- days of progressive dyspnea.She was recently admitted to [facility] from 06/19/18 to 06/22/18 for acute on chronic dCHF and COPD exacerbation.  Her discharge weight was 87.6 kg (193.1 lbs).  She was discharged with lasix 8Restless leg syndrome -Continue Requip  Insomnia -Continue Restoril  Anxiety -Continue Xanax0 mg bid and a prednisone taper.  At home, She endorses compliance with her medications but states that "I drink whatever I want."She intermittently wears oxygen at nighttime at 2 L.She states that she has been taking lasix 80 mg bid only for the past week even though she was discharged on 80 mg bid.  Assessment/Plan: Acute on chronic respiratory failure with hypoxia and hypercarbia -Secondary to CHF and COPD exacerbation -Wean oxygen back to baseline oxygen -At baseline, patient is on 2 L nasal cannula at night -presently on3L  COPD exacerbation -StartIV Solu-Medrol -StartPulmicort -Start duo nebs  Acute on chronic diastolic CHF -Patient has gained 10 pounds since her visit with Dr. Domenic Polite on 06/08/2018. -pt endorsed indiscretion with fluid intake -concerned that pt does not understand what dose of lasix she is suppose to be taking which may has contributed to her decompensation -appreciate cardiology input -continue lasix 80 mg bid -Accurate I's and O's--incomplete -Continue bisoprolol -07/27/2018 echo EF 60-65%, no WMA, PASP 39, CVP 15, mild dilated RV -Her 06/22/18 discharge weight was 87.6  kg (193.1 lbs) -07/26/18 admission weight 204.8 lbs  Permanent atrial fibrillation -Rate controlled -Continue warfarin -Continue diltiazem CD and bisoprolol  CKD stage 3 -baseline creatinine 1.5-1.7 -am BMP  Status post aortic valve replacement(St. Jude) -Performed in 2007 -Continue warfarin -daily INR  Hypothyroidism -Continue Synthroid  Hyperlipidemia -Continue statin  Hypokalemia -replete -check mag  Restless leg syndrome -Continue Requip  Insomnia -Continue Restoril  Anxiety -Continue Xanax      Disposition Plan:   Home in 2-3 days  Family Communication:   Family at bedside updated 10/9  Consultants:  cardiology  Code Status:  FULL   DVT Prophylaxis:  warfarin   Procedures: As Listed in Progress Note Above  Antibiotics: None    Subjective: Pt breathing slightly better.  C/o dyspnea on exertion.  Denies cp, n/v/d, abd pain.  C/o nonproductive cough.  No f/c, neck pain, cp  Objective: Vitals:   07/28/18 0547 07/28/18 0748 07/28/18 1011 07/28/18 1331  BP: 111/68   108/77  Pulse: 65   80  Resp:    20  Temp: 98 F (36.7 C)   98.2 F (36.8 C)  TempSrc: Oral   Oral  SpO2: 97% 95%  98%  Weight:   93.6 kg   Height:        Intake/Output Summary (Last 24 hours) at 07/28/2018 1502 Last data filed at 07/28/2018 0800 Gross per 24 hour  Intake 480 ml  Output 800 ml  Net -320 ml   Weight change: 3.5 kg Exam:   General:  Pt is alert, follows commands appropriately, not in acute distress  HEENT: No icterus, No thrush, No neck mass, [place]/AT  Cardiovascular: RRR, S1/S2, no rubs, no gallops  Respiratory:  CTA bilaterally, no wheezing, no crackles, no rhonchi  Abdomen: Soft/+BS, non tender, non distended, no guarding  Extremities: No edema, No lymphangitis, No petechiae, No rashes, no synovitis   Data Reviewed: I have personally reviewed following labs and imaging studies Basic Metabolic Panel: Recent Labs  Lab 07/26/18 1618  07/27/18 0115 07/28/18 0559  NA 133* 134* 136  K 2.9* 3.4* 3.3*  CL 94* 97* 96*  CO2 29 27 31   GLUCOSE 134* 140* 99  BUN 25* 26* 29*  CREATININE 1.83* 1.75* 1.68*  CALCIUM 8.6* 8.4* 8.8*  MG 1.9  --   --    Liver Function Tests: Recent Labs  Lab 07/26/18 1618 07/27/18 0115  AST 35 34  ALT 21 19  ALKPHOS 86 76  BILITOT 1.1 0.9  PROT 7.3 6.4*  ALBUMIN 3.3* 3.0*   No results for input(s): LIPASE, AMYLASE in the last 168 hours. No results for input(s): AMMONIA in the last 168 hours. Coagulation Profile: Recent Labs  Lab 07/22/18 1005 07/26/18 1618 07/27/18 0115 07/28/18 0559  INR 3.9* 3.33 3.08 3.28   CBC: Recent Labs  Lab 07/26/18 1618 07/27/18 0115 07/28/18 0559  WBC 8.7 5.5 10.6*  NEUTROABS 5.5  --   --   HGB 9.9* 9.2* 8.9*  HCT 32.4* 30.6* 30.2*  MCV 85.5 85.5 87.3  PLT 322 279 281   Cardiac Enzymes: Recent Labs  Lab 07/26/18 1618 07/26/18 1924 07/27/18 0115 07/27/18 0757  TROPONINI 0.03* <0.03 <0.03 <0.03   BNP: Invalid input(s): POCBNP CBG: No results for input(s): GLUCAP in the last 168 hours. HbA1C: No results for input(s): HGBA1C in the last 72 hours. Urine analysis: No results found for: COLORURINE, APPEARANCEUR, LABSPEC, PHURINE, GLUCOSEU, HGBUR, BILIRUBINUR, KETONESUR, PROTEINUR, UROBILINOGEN, NITRITE, LEUKOCYTESUR Sepsis Labs: @LABRCNTIP (procalcitonin:4,lacticidven:4) )No results found for this or any previous visit (from the past 240 hour(s)).   Scheduled Meds: . allopurinol  100 mg Oral Daily  . bisoprolol  10 mg Oral Daily  . diltiazem  120 mg Oral Daily  . furosemide  80 mg Intravenous BID  . levothyroxine  50 mcg Oral QAC breakfast  . loratadine  10 mg Oral Daily  . magnesium oxide  400 mg Oral Daily  . mouth rinse  15 mL Mouth Rinse BID  . mometasone-formoterol  2 puff Inhalation BID  . pantoprazole  40 mg Oral Daily  . potassium chloride  20 mEq Oral BID  . rOPINIRole  3 mg Oral BID  . rosuvastatin  10 mg Oral QHS  .  sodium chloride flush  3 mL Intravenous Q12H  . Warfarin - Pharmacist Dosing Inpatient   Does not apply Q24H   Continuous Infusions: . sodium chloride      Procedures/Studies: Dg Chest Port 1 View  Result Date: 07/26/2018 CLINICAL DATA:  Shortness of breath and cough EXAM: PORTABLE CHEST 1 VIEW COMPARISON:  06/19/2018 FINDINGS: Cardiac shadow remains enlarged. Postsurgical changes are again seen. Mild vascular congestion is noted slightly more prominent than that seen on the prior exam although no significant effusion or edema is noted. No bony abnormality is noted. IMPRESSION: Mild vascular congestion without significant edema or effusion. Electronically Signed   By: Inez Catalina M.D.   On: 07/26/2018 16:59    Orson Eva, DO  [facility] Pager [phone]  If 7PM-7AM, please contact night-coverage [email or web address] Password [email or web address] 07/28/2018, 3:02 PM   LOS: 1 day

## 2018-07-28 NOTE — Plan of Care (Signed)
Nutrition Education Note  RD consulted for nutrition education regarding CHF. Patient apparently had admitted to indiscretion regarding her fluid intake.  Patient does display mild confusion and inconsistency when speaking with RD, unsure what her cognitive baseline is. For example, when RD asked if she rinsed her canned vegetables, she stated "yes, you got to, you dont know whose hands have been on them" and she could not seem to understand what RD meant by "fresh" meats, vegetables and fruits.   Went through diet recall: Breakfast: 1-2 boiled eggs (eats only the yolk because she doesn't like the whites) w/ grits and butter w/ salt substitute vs. Lucky charms Lunch: Skips vs "I knick nack" Dinner: Husband prepares a couple items. Examples: BBQ chicken w/ veg or instant potatoes w/ pork/beans.  Behaviors-says she does not use the salt shaker. Says Husband uses no-little salt when cooking.  Beverages- Reports to RD she is drinking well below <2 Liters? High salt item frequency: Hotdogs: 1x/month. Canned soup: <1x/month, bojangles or other biscuit fast food: 1x/2 weeks. Frozen meals: never. Spam/process meats: <1x/month. Deli meats: 1x/week Other: Does say her husband frequently uses high sauce condiments with their meals such as BBQ sauce and Worcester sauce. She also uses dressings often ([place]).    Despite pt apparently admitting to provider that she is over consuming fluids, she offers a completely different report to RD. She says on her "active days". She will fix herself a large pitcher of water, but "I only take a couple sips before all the ice melts",  RD interpreted this as meaning she drinks minimal fluid.  Her reported breakfast is near perfect, with minimal sodium. The amount of sodium she is consuming at dinner is more difficult to determine. She says her husband fixes many of their meals with sauces and condiments. RD reviewed how these condiments and sauces are major areas for hidden  sodium. RD asked her to be careful of which condiments spouse uses as well as how much she uses.   Similarly, she uses [place] dressing often. RD noted that, like BBQ sauce, this is another major source of hidden sodium. Asked her to try to find one with <350 mg/serving and then only use 1-2 tbsp at a meal.   Due to patients confusion, RD provided some more general guidelines. RD recommended avoiding frozen and canned items as much as possible and to prioritize fresh items. RD had to explain to her what he meant by "fresh".   She does say she weighs herself daily. She believes her dry weight is "190 something"   RD provided "Heart Failure Nutrition Therapy" handout from the Academy of [employer] Dietetics. Highlighted items on the high-sodium food lists that are in her diet and that we discussed.   Expect fair compliance. Patient has some mild confusion. Unsure if this is her baseline.   Despite this, her diet seems to contain a reasonable amount of sodium, perhaps just slightly more than appropriate. She told RD she drinks minimal fluids    No further nutrition interventions warranted at this time.If additional nutrition issues arise, please re-consult RD.   Burtis Junes RD, LDN, CNSC Clinical Nutrition Available Tues-Sat via Pager: [phone] 07/28/2018 6:04 PM

## 2018-07-28 NOTE — Care Management Note (Signed)
Case Management Note  Patient Details  Name: [name] MRN: [id number] Date of Birth: 1951-10-12  Subjective/Objective:    Admitted with CHF. Pt from home, lives with husband. Pt has cane and RW but uses neither with ambulation. Pt uses home oxygen pta. She reports compliance with medications and says she tries to follow a low salt diet. Reports her husband does the cooking and he only seasons with "steak sauces and speciality salts". Pt complains of feeling full d/t having too much to drink. Pt has had 4 admissions and 1 ED visit in the past 6 months, all for CHF. Pt on [facility] registry but not active and no referral made previously per brief chart review. Pt says she is not currently active with HH, was previously with [facility].                Action/Plan: CM will cont to follow for DC planning needs. Consult dietician for diet education. CM will verify with [facility] that they are not currently active. Anticipate pt should have referral to [facility] program at DC. CM will make referral to [facility].  Expected Discharge Date:    07-30-18              Expected Discharge Plan:  [place]  In-House Referral:  NA  Discharge planning Services  CM Consult  Post Acute Care Choice:  Home Health Choice offered to:  Patient  Status of Service:  In process, will continue to follow   Addendum: patient discharged from [facility] in July. Will need another referral for [place] at DC. Pt has used [facility] for all past visits. uanble to enroll in [employer] vest program d/t payor conflict. Pt agreeable to trying another HHA. Santiago Glad, [facility] rep, given referral and will pull pt info from chart. CM to provide pt updates to [employer] rep as needed.     Sherald Barge, RN 07/28/2018, 11:07 AM

## 2018-07-29 DIAGNOSIS — Z7901 Long term (current) use of anticoagulants: Secondary | ICD-10-CM

## 2018-07-29 LAB — PROTIME-INR
INR: 2.59
Prothrombin Time: 27.6 seconds — ABNORMAL HIGH (ref 11.4–15.2)

## 2018-07-29 LAB — CBC
HCT: 32.2 % — ABNORMAL LOW (ref 36.0–46.0)
HEMOGLOBIN: 9.1 g/dL — AB (ref 12.0–15.0)
MCH: 24.5 pg — ABNORMAL LOW (ref 26.0–34.0)
MCHC: 28.3 g/dL — ABNORMAL LOW (ref 30.0–36.0)
MCV: 86.8 fL (ref 80.0–100.0)
NRBC: 0 % (ref 0.0–0.2)
Platelets: 278 10*3/uL (ref 150–400)
RBC: 3.71 MIL/uL — AB (ref 3.87–5.11)
RDW: 17.6 % — ABNORMAL HIGH (ref 11.5–15.5)
WBC: 6 10*3/uL (ref 4.0–10.5)

## 2018-07-29 LAB — BASIC METABOLIC PANEL
Anion gap: 8 (ref 5–15)
BUN: 27 mg/dL — AB (ref 8–23)
CHLORIDE: 99 mmol/L (ref 98–111)
CO2: 31 mmol/L (ref 22–32)
Calcium: 8.8 mg/dL — ABNORMAL LOW (ref 8.9–10.3)
Creatinine, Ser: 1.54 mg/dL — ABNORMAL HIGH (ref 0.44–1.00)
GFR calc non Af Amer: 34 mL/min — ABNORMAL LOW (ref >60)
GFR, EST AFRICAN AMERICAN: 39 mL/min — AB (ref >60)
Glucose, Bld: 157 mg/dL — ABNORMAL HIGH (ref 70–99)
POTASSIUM: 4 mmol/L (ref 3.5–5.1)
SODIUM: 138 mmol/L (ref 135–145)

## 2018-07-29 LAB — MAGNESIUM: MAGNESIUM: 2 mg/dL (ref 1.7–2.4)

## 2018-07-29 MED ORDER — WARFARIN SODIUM 2 MG PO TABS
3.0000 mg | ORAL_TABLET | Freq: Once | ORAL | Status: AC
  Administered 2018-07-29: 3 mg via ORAL
  Filled 2018-07-29: qty 1

## 2018-07-29 MED ORDER — IPRATROPIUM-ALBUTEROL 0.5-2.5 (3) MG/3ML IN SOLN
3.0000 mL | Freq: Three times a day (TID) | RESPIRATORY_TRACT | Status: DC
  Administered 2018-07-30 – 2018-08-02 (×11): 3 mL via RESPIRATORY_TRACT
  Filled 2018-07-29 (×12): qty 3

## 2018-07-29 MED ORDER — CAMPHOR-MENTHOL 0.5-0.5 % EX LOTN
TOPICAL_LOTION | CUTANEOUS | Status: DC | PRN
  Administered 2018-07-30: 1 via TOPICAL
  Filled 2018-07-29 (×2): qty 222

## 2018-07-29 MED ORDER — METOLAZONE 5 MG PO TABS
2.5000 mg | ORAL_TABLET | Freq: Once | ORAL | Status: AC
  Administered 2018-07-29: 2.5 mg via ORAL
  Filled 2018-07-29: qty 1

## 2018-07-29 NOTE — Progress Notes (Signed)
Progress Note  Patient Name: [name] Date of Encounter: 07/29/2018  Primary Cardiologist: Dr. Satira Sark  Subjective   Breathing somewhat better.  No chest pain or palpitations.  She has had some loose stools recently.  Inpatient Medications    Scheduled Meds: . allopurinol  100 mg Oral Daily  . bisoprolol  10 mg Oral Daily  . budesonide (PULMICORT) nebulizer solution  0.5 mg Nebulization BID  . diltiazem  120 mg Oral Daily  . furosemide  80 mg Intravenous BID  . ipratropium-albuterol  3 mL Nebulization Q6H  . levothyroxine  50 mcg Oral QAC breakfast  . loratadine  10 mg Oral Daily  . magnesium oxide  400 mg Oral Daily  . mouth rinse  15 mL Mouth Rinse BID  . methylPREDNISolone (SOLU-MEDROL) injection  60 mg Intravenous Q6H  . pantoprazole  40 mg Oral Daily  . potassium chloride  20 mEq Oral BID  . rOPINIRole  3 mg Oral BID  . rosuvastatin  10 mg Oral QHS  . sodium chloride flush  3 mL Intravenous Q12H  . warfarin  3 mg Oral Once  . Warfarin - Pharmacist Dosing Inpatient   Does not apply Q24H   Continuous Infusions: . sodium chloride     PRN Meds: sodium chloride, acetaminophen **OR** acetaminophen, albuterol, ALPRAZolam, benzonatate, cyclobenzaprine, HYDROcodone-acetaminophen, sodium chloride flush, temazepam   Vital Signs    Vitals:   07/29/18 0217 07/29/18 0507 07/29/18 0746 07/29/18 0752  BP:  110/73    Pulse:  68    Resp:  20    Temp:  97.6 F (36.4 C)    TempSrc:  Oral    SpO2: 91% 95% 92% 95%  Weight:  94.1 kg    Height:        Intake/Output Summary (Last 24 hours) at 07/29/2018 0929 Last data filed at 07/29/2018 0724 Gross per 24 hour  Intake 720 ml  Output 1850 ml  Net -1130 ml   Filed Weights   07/28/18 0227 07/28/18 1011 07/29/18 0507  Weight: 94.5 kg 93.6 kg 94.1 kg    Telemetry    Atrial fibrillation with controlled rate.  Personally reviewed.  Physical Exam   GEN:  Obese woman, no acute distress.   Neck: No  JVD. Cardiac:  Irregularly irregular, crisp prosthetic click with soft systolic murmur, no gallop.  Respiratory:  Decreased breath sounds with prolonged expiratory phase.. GI: Soft, nontender, bowel sounds present. MS:  Lower leg edema as before; No deformity. Neuro:  Nonfocal. Psych: Alert and oriented x 3. Normal affect.  Labs    Chemistry Recent Labs  Lab 07/26/18 1618 07/27/18 0115 07/28/18 0559 07/29/18 0524  NA 133* 134* 136 138  K 2.9* 3.4* 3.3* 4.0  CL 94* 97* 96* 99  CO2 29 27 31 31   GLUCOSE 134* 140* 99 157*  BUN 25* 26* 29* 27*  CREATININE 1.83* 1.75* 1.68* 1.54*  CALCIUM 8.6* 8.4* 8.8* 8.8*  PROT 7.3 6.4*  --   --   ALBUMIN 3.3* 3.0*  --   --   AST 35 34  --   --   ALT 21 19  --   --   ALKPHOS 86 76  --   --   BILITOT 1.1 0.9  --   --   GFRNONAA 28* 29* 31* 34*  GFRAA 32* 34* 36* 39*  ANIONGAP 10 10 9 8      Hematology Recent Labs  Lab 07/27/18 0115 07/28/18 0559 07/29/18 0086  WBC 5.5 10.6* 6.0  RBC 3.58* 3.46* 3.71*  HGB 9.2* 8.9* 9.1*  HCT 30.6* 30.2* 32.2*  MCV 85.5 87.3 86.8  MCH 25.7* 25.7* 24.5*  MCHC 30.1 29.5* 28.3*  RDW 17.3* 17.6* 17.6*  PLT 279 281 278    Cardiac Enzymes Recent Labs  Lab 07/26/18 1618 07/26/18 1924 07/27/18 0115 07/27/18 0757  TROPONINI 0.03* <0.03 <0.03 <0.03    Recent Labs  Lab 07/26/18 1625  TROPIPOC 0.02     BNP Recent Labs  Lab 07/26/18 1606  BNP 813.0*     Radiology    No results found.  Cardiac Studies   Echocardiogram 07/27/2018: Study Conclusions  - Left ventricle: The cavity size was normal. Wall thickness was   increased in a pattern of mild LVH. Systolic function was normal.   The estimated ejection fraction was in the range of 60% to 65%.   Wall motion was normal; there were no regional wall motion   abnormalities. The study was not technically sufficient to allow   evaluation of LV diastolic dysfunction due to atrial   fibrillation. - Aortic valve: St. Jude mechanical  prosthesis in aortic position.   There was no significant regurgitation. Mean gradient (S): 6 mm   Hg. Peak gradient (S): 11 mm Hg. Normal prosthetic function based   on gradients. - Mitral valve: Moderately calcified annulus. There was mild   regurgitation directed eccentrically. - Left atrium: The atrium was severely dilated. - Right ventricle: The cavity size was moderately dilated. Systolic   function was mildly reduced. - Right atrium: The atrium was at the upper limits of normal in   size. Central venous pressure (est): 15 mm Hg. - Atrial septum: No defect or patent foramen ovale was identified. - Tricuspid valve: There was mild-moderate regurgitation. - Pulmonary arteries: PA peak pressure: 39 mm Hg (S). - Pericardium, extracardiac: There was no pericardial effusion.  Patient Profile     67 y.o. female with history of chronic diastolic heart failure, aortic stenosis status post mechanical AVR with CABG in 2007, GERD, hyperlipidemia, atrial fibrillation, and COPD.  She is currently admitted with acute on chronic diastolic heart failure and COPD exacerbation.  Assessment & Plan    1.  Acute on chronic diastolic heart failure.  Continues to diurese on IV Lasix and creatinine is actually improving down to 1.54 from 1.68.  Still not at baseline in terms of symptom control.  2.  Persistent atrial fibrillation, heart rate is controlled on bisoprolol and she continues on Coumadin with follow-up by pharmacy.  3.  Monitor troponin I elevation not consistent with ACS, reflective of heart failure exacerbation and myocardial strain.  4.  Mechanical AVR in place with normal function by recent echocardiogram.  Continue current medications including Zebeta, Cardizem CD, Crestor, Coumadin, and IV Lasix at 80 mg twice daily with potassium supplements.  Also give dose of metolazone 2.5 mg today.  Follow-up urine output and BMET in a.m.  Signed, Rozann Lesches, MD  07/29/2018, 9:29 AM

## 2018-07-29 NOTE — Progress Notes (Signed)
ANTICOAGULATION CONSULT NOTE - Initial Consult  Pharmacy Consult for Warfarin Indication: atrial fibrillation  Allergies  Allergen Reactions  . Penicillins Hives, Itching, Swelling and Rash    Has patient had a PCN reaction causing immediate rash, facial/tongue/throat swelling, SOB or lightheadedness with hypotension: Yes Has patient had a PCN reaction causing severe rash involving mucus membranes or skin necrosis: Yes Has patient had a PCN reaction that required hospitalization: unknown Has patient had a PCN reaction occurring within the last 10 years: No If all of the above answers are "NO", then may proceed with Cephalosporin use.    Throat swelling    Patient Measurements: Height: 5\' 8"  (172.7 cm) Weight: 207 lb 7.3 oz (94.1 kg) IBW/kg (Calculated) : 63.9   Vital Signs: Temp: 97.6 F (36.4 C) (10/10 0507) Temp Source: Oral (10/10 0507) BP: 110/73 (10/10 0507) Pulse Rate: 68 (10/10 0507)  Labs: Recent Labs    07/26/18 1924 07/27/18 0115 07/27/18 0757 07/28/18 0559 07/29/18 0524  HGB  --  9.2*  --  8.9* 9.1*  HCT  --  30.6*  --  30.2* 32.2*  PLT  --  279  --  281 278  LABPROT  --  31.6*  --  33.2* 27.6*  INR  --  3.08  --  3.28 2.59  CREATININE  --  1.75*  --  1.68* 1.54*  TROPONINI <0.03 <0.03 <0.03  --   --     Estimated Creatinine Clearance: 43.1 mL/min (A) (by C-G formula based on SCr of 1.54 mg/dL (H)).   Medical History: Past Medical History:  Diagnosis Date  . Acute on chronic diastolic (congestive) heart failure ([place])   . Anxiety disorder   . Aortic stenosis    Status post St. Jude mechanical AVR 2007  . Asthma   . Atrial fibrillation ([place])   . Carcinoid tumor of colon 2007  . Coronary atherosclerosis of native coronary artery    Status post CABG 2007  . GERD (gastroesophageal reflux disease)   . History of colonoscopy 2003   Dr. Gala Romney - normal  . Hyperlipidemia   . Macromastia   . Pedal edema    Bilateral, chronic  . 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 Gala Romney, benign biopsies  . RLS (restless legs syndrome)   . Schatzki's ring     Medications:  Medications Prior to Admission  Medication Sig Dispense Refill Last Dose  . albuterol (PROVENTIL HFA;VENTOLIN HFA) 108 (90 BASE) MCG/ACT inhaler Inhale 1-2 puffs into the lungs every 6 (six) hours as needed for wheezing or shortness of breath.    07/26/2018 at Unknown time  . allopurinol (ZYLOPRIM) 100 MG tablet Take 100 mg by mouth daily.   07/26/2018 at Unknown time  . ALPRAZolam (XANAX) 0.25 MG tablet Take 1 tablet (0.25 mg total) by mouth 2 (two) times daily as needed for anxiety. (Patient taking differently: Take 1 mg by mouth 2 (two) times daily as needed for anxiety. ) 10 tablet 0 07/26/2018 at Unknown time  . amoxicillin (AMOXIL) 500 MG capsule Take 500 mg by mouth 2 (two) times daily.   07/26/2018 at Unknown time  . benzonatate (TESSALON) 200 MG capsule Take 1 capsule by mouth 3 (three) times daily as needed for cough.   1 07/26/2018 at Unknown time  . bisoprolol (ZEBETA) 10 MG tablet Take 10 mg by mouth daily.   07/26/2018 at [place]  . budesonide-formoterol (SYMBICORT) 160-4.5 MCG/ACT inhaler Inhale 2 puffs into  the lungs 2 (two) times daily. 1 Inhaler 3 07/26/2018 at Unknown time  . calcium carbonate (OS-CAL) 600 MG TABS tablet Take 600 mg by mouth 2 (two) times daily.   07/26/2018 at Unknown time  . cetirizine (ZYRTEC) 10 MG tablet Take 10 mg by mouth daily.     07/26/2018 at Unknown time  . CRESTOR 10 MG tablet Take 10 mg by mouth at bedtime.    07/25/2018 at Unknown time  . cyclobenzaprine (FLEXERIL) 10 MG tablet Take 10 mg by mouth daily as needed for muscle spasms.   unknown at Unknown time  . diltiazem (CARDIZEM CD) 120 MG 24 hr capsule Take 1 capsule (120 mg total) by mouth daily. 30 capsule 2 07/26/2018 at Unknown time  . furosemide (LASIX) 80 MG tablet Take 1 tablet (80 mg total) by mouth 2 (two) times daily. 60 tablet 1 07/26/2018 at Unknown  time  . HYDROcodone-acetaminophen (NORCO/VICODIN) 5-325 MG tablet Take 1 tablet by mouth daily as needed for moderate pain. Normally takes one tablet at bedtime for back pain   07/25/2018 at Unknown time  . levothyroxine (SYNTHROID, LEVOTHROID) 50 MCG tablet Take 50 mcg by mouth daily before breakfast.    07/26/2018 at Unknown time  . magnesium oxide (MAG-OX) 400 MG tablet Take 400 mg by mouth daily.   07/26/2018 at Unknown time  . Multiple Vitamin (MULTIVITAMIN WITH MINERALS) TABS tablet Take 1 tablet by mouth daily.   07/26/2018 at Unknown time  . omeprazole (PRILOSEC) 20 MG capsule Take 1 capsule (20 mg total) by mouth 2 (two) times daily before a meal. 60 capsule 5 07/26/2018 at Unknown time  . OXYGEN Inhale 2 L into the lungs daily.    07/26/2018 at Unknown time  . potassium chloride (KLOR-CON) 20 MEQ packet Take 20 mEq by mouth 2 (two) times daily.    07/26/2018 at Unknown time  . rOPINIRole (REQUIP) 3 MG tablet Take 3 mg by mouth 2 (two) times daily.    07/26/2018 at Unknown time  . temazepam (RESTORIL) 30 MG capsule Take 30 mg by mouth at bedtime as needed for sleep.    Past Week at Unknown time  . warfarin (COUMADIN) 2 MG tablet Takes one tablet (2mg ) everyday (Patient taking differently: Take 3-6 mg by mouth See admin instructions. Take 3 tablets (6mg  total) on Tuesdays only. Take 1.5 (3mg  total) tablets on all other days in the evening) 45 tablet 3 07/25/2018 at 1800  . doxycycline (VIBRA-TABS) 100 MG tablet Take 100 mg by mouth 2 (two) times daily.   Completed Course at Unknown time  . predniSONE (DELTASONE) 10 MG tablet Take 6 tablets (60 mg total) by mouth daily with breakfast. And decrease by one tablet daily (Patient not taking: Reported on 07/26/2018) 21 tablet 0 Not Taking at Unknown time    Assessment: Pharmacy consulted to dose warfarin in patient with atrial fibrillation.  Patient's INR is currently therapeutic at 2.59.  Home dose is listed as 4 mg on Tues and 3 mg ROW.  Goal of Therapy:   INR 2-3   Plan:  Warfarin 3 mg x 1 dose Daily PT/INR. Monitor for signs and symptoms of bleeding.   Revonda Standard Daryel Kenneth 07/29/2018,8:30 AM

## 2018-07-29 NOTE — Progress Notes (Signed)
PROGRESS NOTE  [name]:[id number] DOB: [birth year] DOA: 07/26/2018 PCP: Octavio Graves, DO  Brief History:  67 y.o.femalewith medical history ofdiastolic CHF, CAD, aortic stenosis with history of AVR in 2007, asthma, chronic atrial fibrillation, history of carcinoid tumor, anxiety disorder, GERD, hyperlipidemia, PUD with stricture of the esophagus at the GE junction level, Schatzki's ring, restless leg syndrome, sleep apnea not using CPAPpresenting with7- days of progressive dyspnea.She was recently admitted to [facility] from 06/19/18 to 06/22/18 for acute on chronic dCHF and COPD exacerbation.  Her discharge weight was 87.6 kg (193.1 lbs).  She was discharged with lasix 80 mg bid.   At home, She endorses compliance with her medications but states that "I drink whatever I want."She intermittently wears oxygen at nighttime at 2 L.She states that she has been taking lasix 80 mg bid only for the past week even though she was discharged on 80 mg bid on 06/22/18  Assessment/Plan: Acute on chronic respiratory failure with hypoxia and hypercarbia -Secondary to CHF and COPD exacerbation -Wean oxygen back to baseline oxygen -At baseline, patient is on 2 L nasal cannula at night -presently on3L  COPD exacerbation -ContinueIV Solu-Medrol -ContinuePulmicort -Start duo nebs  Acute on chronic diastolic CHF -pt endorsed indiscretion with fluid intake -concerned that pt does not understand what dose of lasix she is suppose to be taking which may has contributed to her decompensation -appreciate cardiology input -continue lasix 80 mg bid IV -Accurate I's and O's--incomplete -Continue bisoprolol -07/27/2018 echo EF 60-65%, no WMA, PASP 39, CVP 15, mild dilated RV -Her 06/22/18 discharge weight was 87.6 kg (193.1 lbs) -07/26/18 admission weight 204.8 lbs  Permanent atrial fibrillation -Rate controlled -Continue warfarin -Continue diltiazem CD and bisoprolol  CKD stage  3 -baseline creatinine 1.5-1.7 -am BMP  Status post aortic valve replacement(St. Jude) -Performed in 2007 -Continue warfarin -daily INR  Hypothyroidism -Continue Synthroid  Hyperlipidemia -Continue statin  Hypokalemia -replete -check mag--2.0  Restless leg syndrome -Continue Requip  Insomnia -ContinueRestoril  Anxiety -Continue Xanax  Disposition Plan:   Home when cleared by cardiology  Family Communication:   Family at bedside updated 10/9  Consultants:  cardiology  Code Status:  FULL   DVT Prophylaxis:  warfarin   Procedures: As Listed in Progress Note Above  Antibiotics: None     Subjective: Pt is breathing better.  Denies cp, n/v/d, abd pain. No f/c abd pain dysuria.  Objective: Vitals:   07/29/18 0746 07/29/18 0752 07/29/18 1328 07/29/18 1339  BP:   100/68   Pulse:   87   Resp:   18   Temp:   97.8 F (36.6 C)   TempSrc:   Oral   SpO2: 92% 95% 90% 91%  Weight:      Height:        Intake/Output Summary (Last 24 hours) at 07/29/2018 1755 Last data filed at 07/29/2018 1538 Gross per 24 hour  Intake 840 ml  Output 2400 ml  Net -1560 ml   Weight change: -0.9 kg Exam:   General:  Pt is alert, follows commands appropriately, not in acute distress  HEENT: No icterus, No thrush, No neck mass, [place]/AT  Cardiovascular:I RRR, S1/S2, no rubs, no gallops  Respiratory: bibasilar crackles, mild bibasilar wheeze  Abdomen: Soft/+BS, non tender, non distended, no guarding  Extremities: 1 + LE edema, No lymphangitis, No petechiae, No rashes, no synovitis   Data Reviewed: I have personally reviewed following labs and imaging studies Basic  Metabolic Panel: Recent Labs  Lab 07/26/18 1618 07/27/18 0115 07/28/18 0559 07/29/18 0524  NA 133* 134* 136 138  K 2.9* 3.4* 3.3* 4.0  CL 94* 97* 96* 99  CO2 29 27 31 31   GLUCOSE 134* 140* 99 157*  BUN 25* 26* 29* 27*  CREATININE 1.83* 1.75* 1.68* 1.54*  CALCIUM 8.6* 8.4* 8.8* 8.8*   MG 1.9  --   --  2.0   Liver Function Tests: Recent Labs  Lab 07/26/18 1618 07/27/18 0115  AST 35 34  ALT 21 19  ALKPHOS 86 76  BILITOT 1.1 0.9  PROT 7.3 6.4*  ALBUMIN 3.3* 3.0*   No results for input(s): LIPASE, AMYLASE in the last 168 hours. No results for input(s): AMMONIA in the last 168 hours. Coagulation Profile: Recent Labs  Lab 07/26/18 1618 07/27/18 0115 07/28/18 0559 07/29/18 0524  INR 3.33 3.08 3.28 2.59   CBC: Recent Labs  Lab 07/26/18 1618 07/27/18 0115 07/28/18 0559 07/29/18 0524  WBC 8.7 5.5 10.6* 6.0  NEUTROABS 5.5  --   --   --   HGB 9.9* 9.2* 8.9* 9.1*  HCT 32.4* 30.6* 30.2* 32.2*  MCV 85.5 85.5 87.3 86.8  PLT 322 279 281 278   Cardiac Enzymes: Recent Labs  Lab 07/26/18 1618 07/26/18 1924 07/27/18 0115 07/27/18 0757  TROPONINI 0.03* <0.03 <0.03 <0.03   BNP: Invalid input(s): POCBNP CBG: No results for input(s): GLUCAP in the last 168 hours. HbA1C: No results for input(s): HGBA1C in the last 72 hours. Urine analysis: No results found for: COLORURINE, APPEARANCEUR, LABSPEC, PHURINE, GLUCOSEU, HGBUR, BILIRUBINUR, KETONESUR, PROTEINUR, UROBILINOGEN, NITRITE, LEUKOCYTESUR Sepsis Labs: @LABRCNTIP (procalcitonin:4,lacticidven:4) )No results found for this or any previous visit (from the past 240 hour(s)).   Scheduled Meds: . allopurinol  100 mg Oral Daily  . bisoprolol  10 mg Oral Daily  . budesonide (PULMICORT) nebulizer solution  0.5 mg Nebulization BID  . diltiazem  120 mg Oral Daily  . furosemide  80 mg Intravenous BID  . ipratropium-albuterol  3 mL Nebulization Q6H  . levothyroxine  50 mcg Oral QAC breakfast  . loratadine  10 mg Oral Daily  . magnesium oxide  400 mg Oral Daily  . mouth rinse  15 mL Mouth Rinse BID  . methylPREDNISolone (SOLU-MEDROL) injection  60 mg Intravenous Q6H  . pantoprazole  40 mg Oral Daily  . potassium chloride  20 mEq Oral BID  . rOPINIRole  3 mg Oral BID  . rosuvastatin  10 mg Oral QHS  . sodium  chloride flush  3 mL Intravenous Q12H  . Warfarin - Pharmacist Dosing Inpatient   Does not apply Q24H   Continuous Infusions: . sodium chloride      Procedures/Studies: Dg Chest Port 1 View  Result Date: 07/26/2018 CLINICAL DATA:  Shortness of breath and cough EXAM: PORTABLE CHEST 1 VIEW COMPARISON:  06/19/2018 FINDINGS: Cardiac shadow remains enlarged. Postsurgical changes are again seen. Mild vascular congestion is noted slightly more prominent than that seen on the prior exam although no significant effusion or edema is noted. No bony abnormality is noted. IMPRESSION: Mild vascular congestion without significant edema or effusion. Electronically Signed   By: Inez Catalina M.D.   On: 07/26/2018 16:59    Orson Eva, DO  [facility] Pager [phone]  If 7PM-7AM, please contact night-coverage [email or web address] Password [email or web address] 07/29/2018, 5:55 PM   LOS: 2 days

## 2018-07-30 LAB — BASIC METABOLIC PANEL
Anion gap: 11 (ref 5–15)
BUN: 30 mg/dL — AB (ref 8–23)
CHLORIDE: 94 mmol/L — AB (ref 98–111)
CO2: 32 mmol/L (ref 22–32)
Calcium: 8.9 mg/dL (ref 8.9–10.3)
Creatinine, Ser: 1.41 mg/dL — ABNORMAL HIGH (ref 0.44–1.00)
GFR calc Af Amer: 44 mL/min — ABNORMAL LOW (ref >60)
GFR calc non Af Amer: 38 mL/min — ABNORMAL LOW (ref >60)
GLUCOSE: 159 mg/dL — AB (ref 70–99)
POTASSIUM: 3.2 mmol/L — AB (ref 3.5–5.1)
Sodium: 137 mmol/L (ref 135–145)

## 2018-07-30 LAB — PROTIME-INR
INR: 2.46
Prothrombin Time: 26.5 seconds — ABNORMAL HIGH (ref 11.4–15.2)

## 2018-07-30 MED ORDER — METOLAZONE 5 MG PO TABS
2.5000 mg | ORAL_TABLET | Freq: Once | ORAL | Status: AC
  Administered 2018-07-30: 2.5 mg via ORAL
  Filled 2018-07-30: qty 1

## 2018-07-30 MED ORDER — BISOPROLOL FUMARATE 5 MG PO TABS
5.0000 mg | ORAL_TABLET | Freq: Every day | ORAL | Status: DC
  Administered 2018-07-31 – 2018-08-02 (×3): 5 mg via ORAL
  Filled 2018-07-30 (×3): qty 1

## 2018-07-30 MED ORDER — GUAIFENESIN-DM 100-10 MG/5ML PO SYRP
5.0000 mL | ORAL_SOLUTION | ORAL | Status: DC | PRN
  Administered 2018-07-30: 5 mL via ORAL
  Filled 2018-07-30: qty 5

## 2018-07-30 NOTE — Progress Notes (Addendum)
[name]   Pharmacy Consult for Warfarin Indication: atrial fibrillation; mechanical aortic valve   Patient Measurements: Height: 5\' 8"  (172.7 cm) Weight: 206 lb 9.1 oz (93.7 kg) IBW/kg (Calculated) : 63.9   Vital Signs: Temp: 98.2 F (36.8 C) (10/11 0555) Temp Source: Oral (10/11 0555) BP: 129/80 (10/11 0919) Pulse Rate: 95 (10/11 0919)  Labs: Recent Labs    07/28/18 0559 07/29/18 0524 07/30/18 0617  HGB 8.9* 9.1*  --   HCT 30.2* 32.2*  --   PLT 281 278  --   LABPROT 33.2* 27.6* 26.5*  INR 3.28 2.59 2.46  CREATININE 1.68* 1.54* 1.41*    Estimated Creatinine Clearance: 47 mL/min (A) (by C-G formula based on SCr of 1.41 mg/dL (H)).   Medical History: Past Medical History:  Diagnosis Date  . Acute on chronic diastolic (congestive) heart failure ([place])   . Anxiety disorder   . Aortic stenosis    Status post St. Jude mechanical AVR 2007  . Asthma   . Atrial fibrillation ([place])   . Carcinoid tumor of colon 2007  . Coronary atherosclerosis of native coronary artery    Status post CABG 2007  . GERD (gastroesophageal reflux disease)   . History of colonoscopy 2003   Dr. Gala Romney - normal  . Hyperlipidemia   . Macromastia   . Pedal edema    Bilateral, chronic  . Peptic stricture of esophagus 10/04/2010   GE junction on last EGD by Dr. Gala Romney, benign biopsies  . RLS (restless legs syndrome)   . Schatzki's ring      Assessment: Pharmacy consulted to dose warfarin in patient with atrial fibrillation.  Patient's INR is currently therapeutic at 2.46.  Will continue with home warfarin dose today.  Goal of Therapy:  INR 2-3   Plan:  Give home dose of warfarin 3 mg x 1  today Daily PT/INR. Monitor for signs and symptoms of bleeding.    [name], Pharm. D. Clinical Pharmacist 07/30/2018 9:22 AM

## 2018-07-30 NOTE — Progress Notes (Signed)
Progress Note  Patient Name: [name] Date of Encounter: 07/30/2018  Primary Cardiologist: Dr. Satira Sark  Subjective   Breathing more easily today.  No wheeze or cough.  Appetite is good, no further loose stools.  Significant urine output yesterday.  Inpatient Medications    Scheduled Meds: . allopurinol  100 mg Oral Daily  . bisoprolol  10 mg Oral Daily  . budesonide (PULMICORT) nebulizer solution  0.5 mg Nebulization BID  . diltiazem  120 mg Oral Daily  . furosemide  80 mg Intravenous BID  . ipratropium-albuterol  3 mL Nebulization TID  . levothyroxine  50 mcg Oral QAC breakfast  . loratadine  10 mg Oral Daily  . magnesium oxide  400 mg Oral Daily  . mouth rinse  15 mL Mouth Rinse BID  . methylPREDNISolone (SOLU-MEDROL) injection  60 mg Intravenous Q6H  . pantoprazole  40 mg Oral Daily  . potassium chloride  20 mEq Oral BID  . rOPINIRole  3 mg Oral BID  . rosuvastatin  10 mg Oral QHS  . sodium chloride flush  3 mL Intravenous Q12H  . Warfarin - Pharmacist Dosing Inpatient   Does not apply Q24H   Continuous Infusions: . sodium chloride     PRN Meds: sodium chloride, acetaminophen **OR** acetaminophen, albuterol, ALPRAZolam, benzonatate, camphor-menthol, cyclobenzaprine, HYDROcodone-acetaminophen, sodium chloride flush, temazepam   Vital Signs    Vitals:   07/29/18 1339 07/29/18 2053 07/29/18 2105 07/30/18 0555  BP:   105/82 95/70  Pulse:   87 87  Resp:   20 20  Temp:   98.4 F (36.9 C) 98.2 F (36.8 C)  TempSrc:   Oral Oral  SpO2: 91% 94% 96% 95%  Weight:    93.7 kg  Height:        Intake/Output Summary (Last 24 hours) at 07/30/2018 0810 Last data filed at 07/30/2018 0614 Gross per 24 hour  Intake 600 ml  Output 4200 ml  Net -3600 ml   Filed Weights   07/28/18 1011 07/29/18 0507 07/30/18 0555  Weight: 93.6 kg 94.1 kg 93.7 kg    Telemetry    Rate controlled atrial fibrillation.  Personally reviewed.  Physical Exam   HEENT:  Conjunctiva and lids normal, oropharynx clear. Neck: Supple, increased girth, no carotid bruits. Lungs: Nonlabored, decreased breath sounds without wheezing. Cardiac: Irregularly irregular, crisp prosthetic click with soft systolic murmur.  No gallop. Abdomen: Soft, nontender, bowel sounds present. Extremities: Improving lower leg edema.  Labs    Chemistry Recent Labs  Lab 07/26/18 1618 07/27/18 0115 07/28/18 0559 07/29/18 0524 07/30/18 0617  NA 133* 134* 136 138 137  K 2.9* 3.4* 3.3* 4.0 3.2*  CL 94* 97* 96* 99 94*  CO2 29 27 31 31  32  GLUCOSE 134* 140* 99 157* 159*  BUN 25* 26* 29* 27* 30*  CREATININE 1.83* 1.75* 1.68* 1.54* 1.41*  CALCIUM 8.6* 8.4* 8.8* 8.8* 8.9  PROT 7.3 6.4*  --   --   --   ALBUMIN 3.3* 3.0*  --   --   --   AST 35 34  --   --   --   ALT 21 19  --   --   --   ALKPHOS 86 76  --   --   --   BILITOT 1.1 0.9  --   --   --   GFRNONAA 28* 29* 31* 34* 38*  GFRAA 32* 34* 36* 39* 44*  ANIONGAP 10 10 9 8  11  Hematology Recent Labs  Lab 07/27/18 0115 07/28/18 0559 07/29/18 0524  WBC 5.5 10.6* 6.0  RBC 3.58* 3.46* 3.71*  HGB 9.2* 8.9* 9.1*  HCT 30.6* 30.2* 32.2*  MCV 85.5 87.3 86.8  MCH 25.7* 25.7* 24.5*  MCHC 30.1 29.5* 28.3*  RDW 17.3* 17.6* 17.6*  PLT 279 281 278    Cardiac Enzymes Recent Labs  Lab 07/26/18 1618 07/26/18 1924 07/27/18 0115 07/27/18 0757  TROPONINI 0.03* <0.03 <0.03 <0.03    Recent Labs  Lab 07/26/18 1625  TROPIPOC 0.02     BNP Recent Labs  Lab 07/26/18 1606  BNP 813.0*     Radiology    No results found.  Cardiac Studies   Echocardiogram 07/27/2018: Study Conclusions  - Left ventricle: The cavity size was normal. Wall thickness was   increased in a pattern of mild LVH. Systolic function was normal.   The estimated ejection fraction was in the range of 60% to 65%.   Wall motion was normal; there were no regional wall motion   abnormalities. The study was not technically sufficient to allow    evaluation of LV diastolic dysfunction due to atrial   fibrillation. - Aortic valve: St. Jude mechanical prosthesis in aortic position.   There was no significant regurgitation. Mean gradient (S): 6 mm   Hg. Peak gradient (S): 11 mm Hg. Normal prosthetic function based   on gradients. - Mitral valve: Moderately calcified annulus. There was mild   regurgitation directed eccentrically. - Left atrium: The atrium was severely dilated. - Right ventricle: The cavity size was moderately dilated. Systolic   function was mildly reduced. - Right atrium: The atrium was at the upper limits of normal in   size. Central venous pressure (est): 15 mm Hg. - Atrial septum: No defect or patent foramen ovale was identified. - Tricuspid valve: There was mild-moderate regurgitation. - Pulmonary arteries: PA peak pressure: 39 mm Hg (S). - Pericardium, extracardiac: There was no pericardial effusion.  Patient Profile     67 y.o. female with history of chronic diastolic heart failure, aortic stenosis status post mechanical AVR with CABG in 2007, GERD, hyperlipidemia, atrial fibrillation, and COPD.  She is currently admitted with acute on chronic diastolic heart failure and COPD exacerbation.  Assessment & Plan    1.  Acute on chronic diastolic heart failure.  Patient had substantial net diuresis of approximately 3300 cc last 24 hours on Lasix 80 mg IV twice daily with single dose of metolazone 2.5 mg daily.  Renal function tolerating well, in fact creatinine is coming down.  2.  Persistent atrial fibrillation.  Heart rate controlled on bisoprolol.  She continues on Coumadin for stroke prophylaxis with management per pharmacy.  3.  Minor troponin elevation not consistent with ACS.  Consistent with heart failure exacerbation and myocardial strain.  4.  Mechanical AVR, normal function by recent follow-up echocardiogram.  Patient is improving clinically with diuresis which picked up yesterday.  Continue Lasix 80  mg IV twice daily and give another dose of metolazone 2.5 mg today.  Follow-up urine output and BMET in a.m.  Her baseline weight was around 180 pounds back in August so she is clearly not at baseline.  Would recommend continuing to dose metolazone if renal function remains stable and she continues to have substantial diuresis.  Otherwise no change to present regimen.  Satira Sark, M.D., F.A.C.C.

## 2018-07-30 NOTE — Progress Notes (Signed)
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 Her discharge weight was 87.6 kg (193.1 lbs). She was discharged with lasix 80 mg bid.  At home, She endorses compliance with her medications but states that "I drink whatever I want."She intermittently wears oxygen at nighttime at 2 L.She states that she has been taking lasix 80 mg bid only for the past week even though she was discharged on 80 mg bid on 06/22/18  Assessment/Plan: Acute on chronic respiratory failure with hypoxia and hypercarbia -Secondary to CHF and COPD exacerbation -Wean oxygen back to baseline oxygen -At baseline, patient is on 2 L nasal cannula at night -presently on3L  COPD exacerbation -ContinueIV Solu-Medrol -ContinuePulmicort -Start duo nebs  Acute on chronic diastolic CHF -pt endorsed indiscretion with fluid intake -concerned that pt does not understand what dose of lasix she is suppose to be taking which may has contributed to her decompensation -appreciate cardiology input -continue lasix 80 mg bid IV with intermittent metolazone -Accurate I's and O's--incomplete, but approx NEG 5.3L -Continuebisoprolol-->decrease dose to 5 mg to allow BP room for diuresis -07/27/2018 echo EF 60-65%, no WMA, PASP39,CVP 15, mild dilated RV -Her 06/22/18 discharge weight was 87.6 kg (193.1 lbs) -07/26/18 admission weight 204.8 lbs  Permanent atrial  fibrillation -Rate controlled -Continue warfarin -Continue diltiazem CD and bisoprolol  CKD stage 3 -baseline creatinine 1.5-1.7 -am BMP  Status post aortic valve replacement(St. Jude) -Performed in 2007 -Continue warfarin -daily INR  Hypothyroidism -Continue Synthroid  Hyperlipidemia -Continue statin  Hypokalemia -replete -check mag--2.0  Restless leg syndrome -Continue Requip  Insomnia -ContinueRestoril  Anxiety -Continue Xanax  Disposition Plan: Home 10/13 or 10/14  Family Communication: Family at bedside updated 10/9  Consultants:cardiology  Code Status: FULL   DVT Prophylaxis:warfarin   Procedures: As Listed in Progress Note Above  Antibiotics: None   Subjective: Pt doing better.  Breathing is better.  Complains of nonproductive cough.  No n/v/d, abd pain.  No f/c/  Objective: Vitals:   07/30/18 0819 07/30/18 0919 07/30/18 1431 07/30/18 1510  BP:  129/80  98/85  Pulse:  95  81  Resp:    18  Temp:    98.2 F (36.8 C)  TempSrc:    Oral  SpO2: 93%  93% 94%  Weight:      Height:        Intake/Output Summary (Last 24 hours) at 07/30/2018 1632 Last data filed at 07/30/2018 1500 Gross per 24 hour  Intake 840 ml  Output 4000 ml  Net -3160 ml   Weight change: 0.1 kg Exam:   General:  Pt is alert, follows commands appropriately, not in acute distress  HEENT: No icterus, No thrush, No neck mass, [place]/AT  Cardiovascular: RRR, S1/S2, no rubs, no gallops  Respiratory: bibasilar crackles, minimal basilar wheeze  Abdomen: Soft/+BS, non tender, non distended, no guarding  Extremities: 1+ to 2+ LE edema, No lymphangitis, No petechiae, No rashes, no synovitis   Data Reviewed: I have personally reviewed following labs  and imaging studies Basic Metabolic Panel: Recent Labs  Lab 07/26/18 1618 07/27/18 0115 07/28/18 0559 07/29/18 0524 07/30/18 0617  NA 133* 134* 136 138 137  K 2.9* 3.4* 3.3* 4.0 3.2*  CL 94*  97* 96* 99 94*  CO2 29 27 31 31  32  GLUCOSE 134* 140* 99 157* 159*  BUN 25* 26* 29* 27* 30*  CREATININE 1.83* 1.75* 1.68* 1.54* 1.41*  CALCIUM 8.6* 8.4* 8.8* 8.8* 8.9  MG 1.9  --   --  2.0  --    Liver Function Tests: Recent Labs  Lab 07/26/18 1618 07/27/18 0115  AST 35 34  ALT 21 19  ALKPHOS 86 76  BILITOT 1.1 0.9  PROT 7.3 6.4*  ALBUMIN 3.3* 3.0*   No results for input(s): LIPASE, AMYLASE in the last 168 hours. No results for input(s): AMMONIA in the last 168 hours. Coagulation Profile: Recent Labs  Lab 07/26/18 1618 07/27/18 0115 07/28/18 0559 07/29/18 0524 07/30/18 0617  INR 3.33 3.08 3.28 2.59 2.46   CBC: Recent Labs  Lab 07/26/18 1618 07/27/18 0115 07/28/18 0559 07/29/18 0524  WBC 8.7 5.5 10.6* 6.0  NEUTROABS 5.5  --   --   --   HGB 9.9* 9.2* 8.9* 9.1*  HCT 32.4* 30.6* 30.2* 32.2*  MCV 85.5 85.5 87.3 86.8  PLT 322 279 281 278   Cardiac Enzymes: Recent Labs  Lab 07/26/18 1618 07/26/18 1924 07/27/18 0115 07/27/18 0757  TROPONINI 0.03* <0.03 <0.03 <0.03   BNP: Invalid input(s): POCBNP CBG: No results for input(s): GLUCAP in the last 168 hours. HbA1C: No results for input(s): HGBA1C in the last 72 hours. Urine analysis: No results found for: COLORURINE, APPEARANCEUR, LABSPEC, PHURINE, GLUCOSEU, HGBUR, BILIRUBINUR, KETONESUR, PROTEINUR, UROBILINOGEN, NITRITE, LEUKOCYTESUR Sepsis Labs: @LABRCNTIP (procalcitonin:4,lacticidven:4) )No results found for this or any previous visit (from the past 240 hour(s)).   Scheduled Meds: . allopurinol  100 mg Oral Daily  . bisoprolol  10 mg Oral Daily  . budesonide (PULMICORT) nebulizer solution  0.5 mg Nebulization BID  . diltiazem  120 mg Oral Daily  . furosemide  80 mg Intravenous BID  . ipratropium-albuterol  3 mL Nebulization TID  . levothyroxine  50 mcg Oral QAC breakfast  . loratadine  10 mg Oral Daily  . magnesium oxide  400 mg Oral Daily  . mouth rinse  15 mL Mouth Rinse BID  . methylPREDNISolone  (SOLU-MEDROL) injection  60 mg Intravenous Q6H  . pantoprazole  40 mg Oral Daily  . potassium chloride  20 mEq Oral BID  . rOPINIRole  3 mg Oral BID  . rosuvastatin  10 mg Oral QHS  . sodium chloride flush  3 mL Intravenous Q12H  . Warfarin - Pharmacist Dosing Inpatient   Does not apply Q24H   Continuous Infusions: . sodium chloride      Procedures/Studies: Dg Chest Port 1 View  Result Date: 07/26/2018 CLINICAL DATA:  Shortness of breath and cough EXAM: PORTABLE CHEST 1 VIEW COMPARISON:  06/19/2018 FINDINGS: Cardiac shadow remains enlarged. Postsurgical changes are again seen. Mild vascular congestion is noted slightly more prominent than that seen on the prior exam although no significant effusion or edema is noted. No bony abnormality is noted. IMPRESSION: Mild vascular congestion without significant edema or effusion. Electronically Signed   By: Inez Catalina M.D.   On: 07/26/2018 16:59    Orson Eva, DO  [facility] Pager [phone]  If 7PM-7AM, please contact night-coverage [email or web address] Password [email or web address] 07/30/2018, 4:32 PM   LOS: 3 days

## 2018-07-30 NOTE — Care Management Important Message (Signed)
Important Message  Patient Details  Name: [name] MRN: [id number] Date of Birth: Jan 30, 1951   Medicare Important Message Given:  Yes    Shelda Altes 07/30/2018, 10:40 AM

## 2018-07-31 LAB — BASIC METABOLIC PANEL
Anion gap: 13 (ref 5–15)
BUN: 36 mg/dL — AB (ref 8–23)
CO2: 39 mmol/L — ABNORMAL HIGH (ref 22–32)
Calcium: 9.1 mg/dL (ref 8.9–10.3)
Chloride: 88 mmol/L — ABNORMAL LOW (ref 98–111)
Creatinine, Ser: 1.35 mg/dL — ABNORMAL HIGH (ref 0.44–1.00)
GFR calc Af Amer: 46 mL/min — ABNORMAL LOW (ref >60)
GFR, EST NON AFRICAN AMERICAN: 40 mL/min — AB (ref >60)
Glucose, Bld: 129 mg/dL — ABNORMAL HIGH (ref 70–99)
POTASSIUM: 2.6 mmol/L — AB (ref 3.5–5.1)
Sodium: 140 mmol/L (ref 135–145)

## 2018-07-31 LAB — MAGNESIUM: Magnesium: 2 mg/dL (ref 1.7–2.4)

## 2018-07-31 LAB — PROTIME-INR
INR: 2.46
Prothrombin Time: 26.5 seconds — ABNORMAL HIGH (ref 11.4–15.2)

## 2018-07-31 MED ORDER — WARFARIN SODIUM 2 MG PO TABS
3.0000 mg | ORAL_TABLET | Freq: Once | ORAL | Status: AC
  Administered 2018-07-31: 3 mg via ORAL
  Filled 2018-07-31: qty 1

## 2018-07-31 MED ORDER — METOLAZONE 5 MG PO TABS
2.5000 mg | ORAL_TABLET | Freq: Once | ORAL | Status: AC
  Administered 2018-07-31: 2.5 mg via ORAL
  Filled 2018-07-31: qty 1

## 2018-07-31 MED ORDER — POTASSIUM CHLORIDE CRYS ER 20 MEQ PO TBCR: 40.0000 meq | EXTENDED_RELEASE_TABLET | Freq: Once | ORAL | Status: DC

## 2018-07-31 MED ORDER — POTASSIUM CHLORIDE 20 MEQ/15ML (10%) PO SOLN
40.0000 meq | Freq: Once | ORAL | Status: AC
  Administered 2018-07-31: 40 meq via ORAL
  Filled 2018-07-31: qty 30

## 2018-07-31 MED ORDER — METHYLPREDNISOLONE SODIUM SUCC 125 MG IJ SOLR
60.0000 mg | Freq: Two times a day (BID) | INTRAMUSCULAR | Status: DC
  Filled 2018-07-31: qty 2

## 2018-07-31 NOTE — Progress Notes (Signed)
CRITICAL VALUE ALERT  Critical Value:  Potassium 2.6  Date & Time Notied:  07/31/18 0858  Provider Notified: Dr. Carles Collet  Orders Received/Actions Taken: awaiting further orders

## 2018-07-31 NOTE — Progress Notes (Signed)
[name]   Pharmacy Consult for Warfarin Indication: atrial fibrillation; mechanical aortic valve   Patient Measurements: Height: 5\' 8"  (172.7 cm) Weight: 192 lb 14.4 oz (87.5 kg) IBW/kg (Calculated) : 63.9   Vital Signs: Temp: 98.5 F (36.9 C) (10/12 0500) Temp Source: Oral (10/12 0500) BP: 113/75 (10/12 0500) Pulse Rate: 80 (10/12 0500)  Labs: Recent Labs    07/29/18 0524 07/30/18 0617 07/31/18 0800  HGB 9.1*  --   --   HCT 32.2*  --   --   PLT 278  --   --   LABPROT 27.6* 26.5* 26.5*  INR 2.59 2.46 2.46  CREATININE 1.54* 1.41* 1.35*    Estimated Creatinine Clearance: 47.4 mL/min (A) (by C-G formula based on SCr of 1.35 mg/dL (H)).   Medical History: Past Medical History:  Diagnosis Date  . Acute on chronic diastolic (congestive) heart failure ([place])   . Anxiety disorder   . Aortic stenosis    Status post St. Jude mechanical AVR 2007  . Asthma   . Atrial fibrillation ([place])   . Carcinoid tumor of colon 2007  . Coronary atherosclerosis of native coronary artery    Status post CABG 2007  . GERD (gastroesophageal reflux disease)   . History of colonoscopy 2003   Dr. Gala Romney - normal  . Hyperlipidemia   . Macromastia   . Pedal edema    Bilateral, chronic  . Peptic stricture of esophagus 10/04/2010   GE junction on last EGD by Dr. Gala Romney, benign biopsies  . RLS (restless legs syndrome)   . Schatzki's ring      Assessment: Pharmacy consulted to dose warfarin in patient with atrial fibrillation.  Patient's INR is currently therapeutic at 2.46.  Will continue with home warfarin dose today.  Goal of Therapy:  INR 2-3   Plan:  Give home dose of warfarin 3 mg x 1  today Daily PT/INR. Monitor for signs and symptoms of bleeding.    [name], Pharm. D. Clinical Pharmacist 07/31/2018 10:42 AM

## 2018-07-31 NOTE — Progress Notes (Signed)
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 Her discharge weight was 87.6 kg (193.1 lbs). She was discharged with lasix 80 mg bid.At home, She endorses compliance with her medications but states that "I drink whatever I want."She intermittently wears oxygen at nighttime at 2 L.She states that she has been taking lasix 80 mg bid only for the past week even though she was discharged on 80 mg bid on 06/22/18  Assessment/Plan: Acute on chronic respiratory failure with hypoxia and hypercarbia -Secondary to CHF and COPD exacerbation -Wean oxygen back to baseline oxygen -At baseline, patient is on 2 L nasal cannula at night -presently on3L  COPD exacerbation -ContinueIV Solu-Medrol>>>start wean -ContinuePulmicort -Start duo nebs  Acute on chronic diastolic CHF -pt endorsed indiscretion with fluid intake -concerned that pt does not understand what dose of lasix she is suppose to be taking which may has contributed to her decompensation -appreciate cardiology input -continue lasix 80 mg bidIV with intermittent metolazone -Accurate I's and O's--incomplete, but approx NEG 5.3L -Continuebisoprolol-->decrease dose to 5 mg to allow BP room for diuresis -07/27/2018 echo EF 60-65%, no WMA, PASP39,CVP 15, mild dilated RV -Her 06/22/18 discharge weight was 87.6 kg (193.1 lbs) -07/26/18 admission weight 204.8 lbs  Permanent  atrial fibrillation -Rate controlled -Continue warfarin -Continue diltiazem CD and bisoprolol  CKD stage 3 -baseline creatinine 1.5-1.7 -am BMP  Status post aortic valve replacement(St. Jude) -Performed in 2007 -Continue warfarin -daily INR  Hypothyroidism -Continue Synthroid  Hyperlipidemia -Continue statin  Hypokalemia -replete -check mag--2.0  Restless leg syndrome -Continue Requip  Insomnia -ContinueRestoril  Anxiety -Continue Xanax  Disposition Plan: Home10/14 or 10/15 Family Communication: Family at bedside updated 10/9  Consultants:cardiology  Code Status: FULL   DVT Prophylaxis:warfarin   Procedures: As Listed in Progress Note Above  Antibiotics: None     Subjective: Patient denies fevers, chills, headache, chest pain, dyspnea, nausea, vomiting, diarrhea, abdominal pain, dysuria, hematuria, hematochezia, and melena.   Objective: Vitals:   07/30/18 2238 07/31/18 0500 07/31/18 1416 07/31/18 1441  BP: 112/72 113/75  98/71  Pulse: 72 80  72  Resp: 18 16  20   Temp: 98.4 F (36.9 C) 98.5 F (36.9 C)  98.2 F (36.8 C)  TempSrc: Oral Oral  Oral  SpO2: 93% 90% 93% 93%  Weight:  87.5 kg    Height:        Intake/Output Summary (Last 24 hours) at 07/31/2018 1607 Last data filed at 07/31/2018 0500 Gross per 24 hour  Intake 240 ml  Output 3250 ml  Net -3010 ml   Weight change: -6.2 kg Exam:   General:  Pt is alert, follows commands appropriately, not in acute distress  HEENT: No icterus, No thrush, No neck mass, [place]/AT  Cardiovascular: RRR, S1/S2, no rubs, no gallops  Respiratory: bibasilar crackles, minimal bibasilar wheeze  Abdomen: Soft/+BS, non tender, non distended, no guarding  Extremities: 1+LE edema, No lymphangitis, No petechiae, No rashes, no synovitis   Data Reviewed: I have personally reviewed following labs  and imaging studies Basic Metabolic Panel: Recent Labs  Lab 07/26/18 1618  07/27/18 0115 07/28/18 0559 07/29/18 0524 07/30/18 0617 07/31/18 0800  NA 133* 134* 136 138 137 140  K 2.9* 3.4* 3.3* 4.0 3.2* 2.6*  CL 94* 97* 96* 99 94* 88*  CO2 29 27 31 31  32 39*  GLUCOSE 134* 140* 99 157* 159* 129*  BUN 25* 26* 29* 27* 30* 36*  CREATININE 1.83* 1.75* 1.68* 1.54* 1.41* 1.35*  CALCIUM 8.6* 8.4* 8.8* 8.8* 8.9 9.1  MG 1.9  --   --  2.0  --  2.0   Liver Function Tests: Recent Labs  Lab 07/26/18 1618 07/27/18 0115  AST 35 34  ALT 21 19  ALKPHOS 86 76  BILITOT 1.1 0.9  PROT 7.3 6.4*  ALBUMIN 3.3* 3.0*   No results for input(s): LIPASE, AMYLASE in the last 168 hours. No results for input(s): AMMONIA in the last 168 hours. Coagulation Profile: Recent Labs  Lab 07/27/18 0115 07/28/18 0559 07/29/18 0524 07/30/18 0617 07/31/18 0800  INR 3.08 3.28 2.59 2.46 2.46   CBC: Recent Labs  Lab 07/26/18 1618 07/27/18 0115 07/28/18 0559 07/29/18 0524  WBC 8.7 5.5 10.6* 6.0  NEUTROABS 5.5  --   --   --   HGB 9.9* 9.2* 8.9* 9.1*  HCT 32.4* 30.6* 30.2* 32.2*  MCV 85.5 85.5 87.3 86.8  PLT 322 279 281 278   Cardiac Enzymes: Recent Labs  Lab 07/26/18 1618 07/26/18 1924 07/27/18 0115 07/27/18 0757  TROPONINI 0.03* <0.03 <0.03 <0.03   BNP: Invalid input(s): POCBNP CBG: No results for input(s): GLUCAP in the last 168 hours. HbA1C: No results for input(s): HGBA1C in the last 72 hours. Urine analysis: No results found for: COLORURINE, APPEARANCEUR, LABSPEC, PHURINE, GLUCOSEU, HGBUR, BILIRUBINUR, KETONESUR, PROTEINUR, UROBILINOGEN, NITRITE, LEUKOCYTESUR Sepsis Labs: @LABRCNTIP (procalcitonin:4,lacticidven:4) )No results found for this or any previous visit (from the past 240 hour(s)).   Scheduled Meds: . allopurinol  100 mg Oral Daily  . bisoprolol  5 mg Oral Daily  . budesonide (PULMICORT) nebulizer solution  0.5 mg Nebulization BID  . diltiazem  120 mg Oral Daily  . furosemide  80 mg Intravenous BID  . ipratropium-albuterol  3 mL Nebulization TID    . levothyroxine  50 mcg Oral QAC breakfast  . loratadine  10 mg Oral Daily  . magnesium oxide  400 mg Oral Daily  . mouth rinse  15 mL Mouth Rinse BID  . methylPREDNISolone (SOLU-MEDROL) injection  60 mg Intravenous Q6H  . metolazone  2.5 mg Oral Once  . pantoprazole  40 mg Oral Daily  . potassium chloride  20 mEq Oral BID  . rOPINIRole  3 mg Oral BID  . rosuvastatin  10 mg Oral QHS  . sodium chloride flush  3 mL Intravenous Q12H  . warfarin  3 mg Oral Once  . Warfarin - Pharmacist Dosing Inpatient   Does not apply Q24H   Continuous Infusions: . sodium chloride      Procedures/Studies: Dg Chest Port 1 View  Result Date: 07/26/2018 CLINICAL DATA:  Shortness of breath and cough EXAM: PORTABLE CHEST 1 VIEW COMPARISON:  06/19/2018 FINDINGS: Cardiac shadow remains enlarged. Postsurgical changes are again seen. Mild vascular congestion is noted slightly more prominent than that seen on the prior exam although no significant effusion or edema is noted. No bony abnormality is noted. IMPRESSION: Mild vascular congestion without significant edema or effusion. Electronically Signed   By: Inez Catalina M.D.   On: 07/26/2018 16:59  Orson Eva, DO  [facility] Pager [phone]  If 7PM-7AM, please contact night-coverage [email or web address] Password [email or web address] 07/31/2018, 4:07 PM   LOS: 4 days

## 2018-08-01 LAB — BASIC METABOLIC PANEL
Anion gap: 13 (ref 5–15)
BUN: 44 mg/dL — ABNORMAL HIGH (ref 8–23)
CALCIUM: 8.9 mg/dL (ref 8.9–10.3)
CO2: 44 mmol/L — ABNORMAL HIGH (ref 22–32)
CREATININE: 1.42 mg/dL — AB (ref 0.44–1.00)
Chloride: 83 mmol/L — ABNORMAL LOW (ref 98–111)
GFR, EST AFRICAN AMERICAN: 44 mL/min — AB (ref >60)
GFR, EST NON AFRICAN AMERICAN: 38 mL/min — AB (ref >60)
Glucose, Bld: 119 mg/dL — ABNORMAL HIGH (ref 70–99)
Potassium: 2.6 mmol/L — CL (ref 3.5–5.1)
Sodium: 140 mmol/L (ref 135–145)

## 2018-08-01 LAB — PROTIME-INR
INR: 2.29
Prothrombin Time: 25 seconds — ABNORMAL HIGH (ref 11.4–15.2)

## 2018-08-01 LAB — CBC
HCT: 29.9 % — ABNORMAL LOW (ref 36.0–46.0)
Hemoglobin: 8.8 g/dL — ABNORMAL LOW (ref 12.0–15.0)
MCH: 25.1 pg — AB (ref 26.0–34.0)
MCHC: 29.4 g/dL — AB (ref 30.0–36.0)
MCV: 85.2 fL (ref 80.0–100.0)
Platelets: 256 10*3/uL (ref 150–400)
RBC: 3.51 MIL/uL — ABNORMAL LOW (ref 3.87–5.11)
RDW: 17.4 % — AB (ref 11.5–15.5)
WBC: 7.7 10*3/uL (ref 4.0–10.5)
nRBC: 0.3 % — ABNORMAL HIGH (ref 0.0–0.2)

## 2018-08-01 LAB — MAGNESIUM: MAGNESIUM: 2.1 mg/dL (ref 1.7–2.4)

## 2018-08-01 LAB — PHOSPHORUS: PHOSPHORUS: 4.5 mg/dL (ref 2.5–4.6)

## 2018-08-01 MED ORDER — POTASSIUM CHLORIDE CRYS ER 20 MEQ PO TBCR
40.0000 meq | EXTENDED_RELEASE_TABLET | Freq: Once | ORAL | Status: AC
  Administered 2018-08-01: 40 meq via ORAL
  Filled 2018-08-01: qty 2

## 2018-08-01 MED ORDER — FUROSEMIDE 80 MG PO TABS
80.0000 mg | ORAL_TABLET | Freq: Two times a day (BID) | ORAL | Status: DC
  Administered 2018-08-02: 80 mg via ORAL
  Filled 2018-08-01: qty 1

## 2018-08-01 MED ORDER — WARFARIN SODIUM 2 MG PO TABS
3.0000 mg | ORAL_TABLET | Freq: Once | ORAL | Status: AC
  Administered 2018-08-01: 3 mg via ORAL
  Filled 2018-08-01: qty 1

## 2018-08-01 MED ORDER — PREDNISONE 20 MG PO TABS
60.0000 mg | ORAL_TABLET | Freq: Every day | ORAL | Status: DC
  Administered 2018-08-01 – 2018-08-02 (×2): 60 mg via ORAL
  Filled 2018-08-01 (×2): qty 3

## 2018-08-01 MED ORDER — MAGNESIUM SULFATE 2 GM/50ML IV SOLN
2.0000 g | Freq: Once | INTRAVENOUS | Status: AC
  Administered 2018-08-01: 2 g via INTRAVENOUS
  Filled 2018-08-01: qty 50

## 2018-08-01 NOTE — Progress Notes (Signed)
[name]   Pharmacy Consult for Warfarin Indication: atrial fibrillation; mechanical aortic valve   Patient Measurements: Height: 5\' 8"  (172.7 cm) Weight: 182 lb 8 oz (82.8 kg) IBW/kg (Calculated) : 63.9   Vital Signs: Temp: 98.1 F (36.7 C) (10/13 0634) Temp Source: Oral (10/13 0634) BP: 97/62 (10/13 0634) Pulse Rate: 79 (10/13 0634)  Labs: Recent Labs    07/30/18 0617 07/31/18 0800 08/01/18 0525  HGB  --   --  8.8*  HCT  --   --  29.9*  PLT  --   --  256  LABPROT 26.5* 26.5* 25.0*  INR 2.46 2.46 2.29  CREATININE 1.41* 1.35* 1.42*    Estimated Creatinine Clearance: 44 mL/min (A) (by C-G formula based on SCr of 1.42 mg/dL (H)).   Medical History: Past Medical History:  Diagnosis Date  . Acute on chronic diastolic (congestive) heart failure ([place])   . Anxiety disorder   . Aortic stenosis    Status post St. Jude mechanical AVR 2007  . Asthma   . Atrial fibrillation ([place])   . Carcinoid tumor of colon 2007  . Coronary atherosclerosis of native coronary artery    Status post CABG 2007  . GERD (gastroesophageal reflux disease)   . History of colonoscopy 2003   Dr. Gala Romney - normal  . Hyperlipidemia   . Macromastia   . Pedal edema    Bilateral, chronic  . Peptic stricture of esophagus 10/04/2010   GE junction on last EGD by Dr. Gala Romney, benign biopsies  . RLS (restless legs syndrome)   . Schatzki's ring      Assessment: Pharmacy consulted to dose warfarin in patient with atrial fibrillation.  Patient's INR is 2.26 today.  Will continue with home warfarin dose today.  Goal of Therapy:  INR 2-3   Plan:  Give  warfarin 3 mg x 1  today Daily PT/INR. Monitor for signs and symptoms of bleeding.    [name], Pharm. D. Clinical Pharmacist 08/01/2018 9:59 AM

## 2018-08-01 NOTE — Progress Notes (Signed)
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 Her discharge weight was 87.6 kg (193.1 lbs). She was discharged with lasix 80 mg bid.At home, She endorses compliance with her medications but states that "I drink whatever I want."She intermittently wears oxygen at nighttime at 2 L.She states that she has been taking lasix 80 mg bid only for the past week even though she was discharged on 80 mg bid on 06/22/18  Assessment/Plan: Acute on chronic respiratory failure with hypoxia and hypercarbia -Secondary to CHF and COPD exacerbation -Wean oxygen back to baseline oxygen -At baseline, patient is on 2 L nasal cannula at night -presently on3L  COPD exacerbation -ContinueIV Solu-Medrol>>>start prednisone taper -ContinuePulmicort -Started duo nebs  Acute on chronic diastolic CHF -pt endorsed indiscretion with fluid intake -concerned that pt does not understand what dose of lasix she is suppose to be taking which may has contributed to her decompensation -appreciate cardiology input -continue lasix 80 mg bidIVwith intermittent metolazone -Accurate I's and O's--incomplete, but approx NEG 5.3L -Continuebisoprolol-->decrease dose to 5 mg to allow BP room for diuresis -07/27/2018 echo EF 60-65%, no WMA, PASP39,CVP 15, mild dilated RV -Her 06/22/18 discharge weight was 87.6 kg (193.1 lbs) -07/26/18 admission weight 204.8  lbs -NEG 12 L for the admission  Permanent atrial fibrillation -Rate controlled -Continue warfarin -Continue diltiazem CD and bisoprolol  CKD stage 3 -baseline creatinine 1.4-1.7 -am BMP  Status post aortic valve replacement(St. Jude) -Performed in 2007 -Continue warfarin -daily INR  Hypothyroidism -Continue Synthroid  Hyperlipidemia -Continue statin  Hypokalemia -replete -check mag--2.0  Restless leg syndrome -Continue Requip  Insomnia -ContinueRestoril  Anxiety -Continue Xanax  Disposition Plan: Home10/14 or 10/15if cleared by cardiology Family Communication: No family present  Consultants:cardiology  Code Status: FULL   DVT Prophylaxis:warfarin   Procedures: As Listed in Progress Note Above  Antibiotics: None    Subjective: Patient denies fevers, chills, headache, chest pain, dyspnea, nausea, vomiting, diarrhea, abdominal pain, dysuria, hematuria, hematochezia, and melena.   Objective: Vitals:   07/31/18 2012 07/31/18 2219 08/01/18 0634 08/01/18 0758  BP:  115/77 97/62   Pulse:  82 79   Resp:  (!) 21 20   Temp:  98.3 F (36.8 C) 98.1 F (36.7 C)   TempSrc:  Oral Oral   SpO2: 96% 93% 96% 94%  Weight:   82.8 kg   Height:        Intake/Output Summary (Last 24 hours) at 08/01/2018 1238 Last data filed at 08/01/2018 0848 Gross per 24 hour  Intake 720 ml  Output 4550 ml  Net -3830 ml   Weight change: -4.719 kg Exam:   General:  Pt is alert, follows commands appropriately, not in acute distress  HEENT: No icterus, No thrush, No neck mass, [place]/AT  Cardiovascular: RRR, S1/S2, no rubs, no gallops  Respiratory: fine bibasilar rales, no wheeze; diminished breath sounds bilateral  Abdomen: Soft/+BS, non tender, non distended, no guarding  Extremities: trace LE edema, No lymphangitis, No petechiae, No rashes, no synovitis  Data Reviewed: I have personally reviewed following labs and imaging  studies Basic Metabolic Panel: Recent Labs  Lab 07/26/18 1618  07/28/18 0559 07/29/18 0524 07/30/18 0617 07/31/18 0800 08/01/18 0525 08/01/18 0650  NA 133*   < > 136 138 137 140 140  --   K 2.9*   < > 3.3* 4.0 3.2* 2.6* 2.6*  --   CL 94*   < > 96* 99 94* 88* 83*  --   CO2 29   < > 31 31 32 39* 44*  --   GLUCOSE 134*   < > 99 157* 159* 129* 119*  --   BUN 25*   < > 29* 27* 30* 36* 44*  --   CREATININE 1.83*   < > 1.68* 1.54* 1.41* 1.35* 1.42*  --   CALCIUM 8.6*   < > 8.8* 8.8* 8.9 9.1 8.9  --   MG 1.9  --   --  2.0  --  2.0 2.1  --   PHOS  --   --   --   --   --   --   --  4.5   < > = values in this interval not displayed.   Liver Function Tests: Recent Labs  Lab 07/26/18 1618 07/27/18 0115  AST 35 34  ALT 21 19  ALKPHOS 86 76  BILITOT 1.1 0.9  PROT 7.3 6.4*  ALBUMIN 3.3* 3.0*   No results for input(s): LIPASE, AMYLASE in the last 168 hours. No results for input(s): AMMONIA in the last 168 hours. Coagulation Profile: Recent Labs  Lab 07/28/18 0559 07/29/18 0524 07/30/18 0617 07/31/18 0800 08/01/18 0525  INR 3.28 2.59 2.46 2.46 2.29   CBC: Recent Labs  Lab 07/26/18 1618 07/27/18 0115 07/28/18 0559 07/29/18 0524 08/01/18 0525  WBC 8.7 5.5 10.6* 6.0 7.7  NEUTROABS 5.5  --   --   --   --   HGB 9.9* 9.2* 8.9* 9.1* 8.8*  HCT 32.4* 30.6* 30.2* 32.2* 29.9*  MCV 85.5 85.5 87.3 86.8 85.2  PLT 322 279 281 278 256   Cardiac Enzymes: Recent Labs  Lab 07/26/18 1618 07/26/18 1924 07/27/18 0115 07/27/18 0757  TROPONINI 0.03* <0.03 <0.03 <0.03   BNP: Invalid input(s): POCBNP CBG: No results for input(s): GLUCAP in the last 168 hours. HbA1C: No results for input(s): HGBA1C in the last 72 hours. Urine analysis: No results found for: COLORURINE, APPEARANCEUR, LABSPEC, PHURINE, GLUCOSEU, HGBUR, BILIRUBINUR, KETONESUR, PROTEINUR, UROBILINOGEN, NITRITE, LEUKOCYTESUR Sepsis Labs: @LABRCNTIP (procalcitonin:4,lacticidven:4) )No results found for this or any  previous visit (from the past 240 hour(s)).   Scheduled Meds: . allopurinol  100 mg Oral Daily  . bisoprolol  5 mg Oral Daily  . budesonide (PULMICORT) nebulizer solution  0.5 mg Nebulization BID  . diltiazem  120 mg Oral Daily  . furosemide  80 mg Intravenous BID  . ipratropium-albuterol  3 mL Nebulization TID  . levothyroxine  50 mcg Oral QAC breakfast  . loratadine  10 mg Oral Daily  . magnesium oxide  400 mg Oral Daily  . mouth rinse  15 mL Mouth Rinse BID  . methylPREDNISolone (SOLU-MEDROL) injection  60 mg Intravenous Q12H  . pantoprazole  40 mg Oral Daily  . potassium chloride  20 mEq Oral BID  . rOPINIRole  3 mg Oral BID  . rosuvastatin  10 mg Oral QHS  . sodium chloride flush  3 mL Intravenous Q12H  . warfarin  3 mg Oral Once  . Warfarin - Pharmacist Dosing  Inpatient   Does not apply Q24H   Continuous Infusions: . sodium chloride      Procedures/Studies: Dg Chest Port 1 View  Result Date: 07/26/2018 CLINICAL DATA:  Shortness of breath and cough EXAM: PORTABLE CHEST 1 VIEW COMPARISON:  06/19/2018 FINDINGS: Cardiac shadow remains enlarged. Postsurgical changes are again seen. Mild vascular congestion is noted slightly more prominent than that seen on the prior exam although no significant effusion or edema is noted. No bony abnormality is noted. IMPRESSION: Mild vascular congestion without significant edema or effusion. Electronically Signed   By: Inez Catalina M.D.   On: 07/26/2018 16:59    Orson Eva, DO  [facility] Pager [phone]  If 7PM-7AM, please contact night-coverage [email or web address] Password [email or web address] 08/01/2018, 12:38 PM   LOS: 5 days

## 2018-08-01 NOTE — Discharge Summary (Signed)
Physician Discharge Summary  [name] XBM:[id number] DOB: 1950-12-06 DOA: 07/26/2018  PCP: Octavio Graves, DO  Admit date: 07/26/2018 Discharge date: 08/02/2018  Admitted From: Home Disposition:  Home   Recommendations for Outpatient Follow-up:  1. Follow up with PCP in 1-2 weeks 2. 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 Her discharge weight was 87.6 kg (193.1 lbs). She was discharged with lasix 80 mg bid.At home, She endorses compliance with her medications but states that "I drink whatever I want."She intermittently wears oxygen at nighttime at 2 L.She states that she has been taking lasix 80 mg bid only for the past week even though she was discharged on 80 mg bid on 06/22/18.  After admission, the patient was started on IV lasix with intermittent metolazone dosing with clinical improvement.   Discharge Diagnoses:  Acute on chronic respiratory failure with hypoxia and hypercarbia -Secondary to CHF and COPD exacerbation -Wean oxygen back to baseline oxygen -At baseline, patient is on 2 L nasal cannula  -presently on3L>>2L  COPD exacerbation -ContinueIV Solu-Medrol>>>d/c home with prednisone taper -ContinuedPulmicort -Started duo nebs  Acute on chronic diastolic CHF -pt endorsed indiscretion with fluid intake -concerned that pt does not  understand what dose of lasix she is suppose to be taking which may has contributed to her decompensation -appreciate cardiology input -continue lasix 80 mg bidIVwith intermittent metolazone -d/c home with lasix 80 mg bid -Continuebisoprolol-->decreased dose to 5 mg to allow BP room for diuresis -07/27/2018 echo EF 60-65%, no WMA, PASP39,CVP 15, mild dilated RV -Her 06/22/18 discharge weight was 87.6 kg (193.1 lbs) -07/26/18 admission weight 204.8 lbs -NEG 17.5 L for the admission -discharge weight 172.8 lbs  Permanent atrial fibrillation -Rate controlled -Continue warfarin -Continue diltiazem CD and bisoprolol  CKD stage 3 -baseline creatinine 1.4-1.7 -BMP in one week after d/c -serum creatinine 1.43 on day of d/c  Status post aortic valve replacement(St. Jude) -Performed in 2007 -Continue warfarin -INR 2.27 on day of d/c -daily INR  Hypothyroidism -Continue Synthroid  Hyperlipidemia -Continue statin  Hypokalemia -replete -check mag--2.0  Restless leg syndrome -Continue Requip  Insomnia -ContinueRestoril  Anxiety -Continue Xanax   Discharge Instructions   Allergies as of 08/02/2018      Reactions   Penicillins Hives, Itching, Swelling, Rash   Has patient had a PCN reaction causing immediate rash, facial/tongue/throat swelling, SOB or lightheadedness with hypotension: Yes Has patient had a PCN reaction causing severe rash involving mucus membranes or skin necrosis: Yes Has patient had a PCN reaction that required hospitalization: unknown Has patient had a PCN reaction occurring within the last 10 years: No If all of the above answers are "NO", then may proceed with Cephalosporin use. Throat swelling      Medication List    STOP taking these medications   amoxicillin 500 MG capsule Commonly known as:  AMOXIL   doxycycline 100 MG tablet Commonly known as:  VIBRA-TABS     TAKE these medications   albuterol 108 (90 Base)  MCG/ACT  inhaler Commonly known as:  PROVENTIL HFA;VENTOLIN HFA Inhale 1-2 puffs into the lungs every 6 (six) hours as needed for wheezing or shortness of breath.   allopurinol 100 MG tablet Commonly known as:  ZYLOPRIM Take 100 mg by mouth daily.   ALPRAZolam 0.25 MG tablet Commonly known as:  XANAX Take 1 tablet (0.25 mg total) by mouth 2 (two) times daily as needed for anxiety. What changed:  how much to take   benzonatate 200 MG capsule Commonly known as:  TESSALON Take 1 capsule by mouth 3 (three) times daily as needed for cough.   bisoprolol 5 MG tablet Commonly known as:  ZEBETA Take 1 tablet (5 mg total) by mouth daily. Start taking on:  08/03/2018 What changed:    medication strength  how much to take   budesonide-formoterol 160-4.5 MCG/ACT inhaler Commonly known as:  SYMBICORT Inhale 2 puffs into the lungs 2 (two) times daily.   calcium carbonate 600 MG Tabs tablet Commonly known as:  OS-CAL Take 600 mg by mouth 2 (two) times daily.   cetirizine 10 MG tablet Commonly known as:  ZYRTEC Take 10 mg by mouth daily.   CRESTOR 10 MG tablet Generic drug:  rosuvastatin Take 10 mg by mouth at bedtime.   cyclobenzaprine 10 MG tablet Commonly known as:  FLEXERIL Take 10 mg by mouth daily as needed for muscle spasms.   diltiazem 120 MG 24 hr capsule Commonly known as:  CARDIZEM CD Take 1 capsule (120 mg total) by mouth daily.   furosemide 80 MG tablet Commonly known as:  LASIX Take 1 tablet (80 mg total) by mouth 2 (two) times daily.   HYDROcodone-acetaminophen 5-325 MG tablet Commonly known as:  NORCO/VICODIN Take 1 tablet by mouth daily as needed for moderate pain. Normally takes one tablet at bedtime for back pain   levothyroxine 50 MCG tablet Commonly known as:  SYNTHROID, LEVOTHROID Take 50 mcg by mouth daily before breakfast.   magnesium oxide 400 MG tablet Commonly known as:  MAG-OX Take 400 mg by mouth daily.   multivitamin with minerals Tabs  tablet Take 1 tablet by mouth daily.   omeprazole 20 MG capsule Commonly known as:  PRILOSEC Take 1 capsule (20 mg total) by mouth 2 (two) times daily before a meal.   OXYGEN Inhale 2 L into the lungs daily.   potassium chloride 20 MEQ packet Commonly known as:  KLOR-CON Take 20 mEq by mouth 2 (two) times daily.   predniSONE 10 MG tablet Commonly known as:  DELTASONE Take 6 tablets (60 mg total) by mouth daily with breakfast. And decrease by one tablet daily Start taking on:  08/03/2018   rOPINIRole 3 MG tablet Commonly known as:  REQUIP Take 3 mg by mouth 2 (two) times daily.   temazepam 30 MG capsule Commonly known as:  RESTORIL Take 30 mg by mouth at bedtime as needed for sleep.   warfarin 2 MG tablet Commonly known as:  COUMADIN Take as directed. If you are unsure how to take this medication, talk to your nurse or doctor. Original instructions:  Takes one tablet (2mg ) everyday What changed:    how much to take  how to take this  when to take this  additional instructions       Allergies  Allergen Reactions  . Penicillins Hives, Itching, Swelling and Rash    Has patient had a PCN reaction causing immediate rash, facial/tongue/throat swelling, SOB or lightheadedness with hypotension: Yes Has patient had  a PCN reaction causing severe rash involving mucus membranes or skin necrosis: Yes Has patient had a PCN reaction that required hospitalization: unknown Has patient had a PCN reaction occurring within the last 10 years: No If all of the above answers are "NO", then may proceed with Cephalosporin use.    Throat swelling    Consultations:  cardiology   Procedures/Studies: Dg Chest Port 1 View  Result Date: 07/26/2018 CLINICAL DATA:  Shortness of breath and cough EXAM: PORTABLE CHEST 1 VIEW COMPARISON:  06/19/2018 FINDINGS: Cardiac shadow remains enlarged. Postsurgical changes are again seen. Mild vascular congestion is noted slightly more prominent  than that seen on the prior exam although no significant effusion or edema is noted. No bony abnormality is noted. IMPRESSION: Mild vascular congestion without significant edema or effusion. Electronically Signed   By: Inez Catalina M.D.   On: 07/26/2018 16:59        Discharge Exam: Vitals:   08/02/18 1435 08/02/18 1451  BP: 98/67   Pulse: 77   Resp: 20   Temp: 98.3 F (36.8 C)   SpO2: 95% 94%   Vitals:   08/02/18 0729 08/02/18 0939 08/02/18 1435 08/02/18 1451  BP: 108/75  98/67   Pulse: 75  77   Resp: 20  20   Temp: 98.5 F (36.9 C)  98.3 F (36.8 C)   TempSrc: Oral  Oral   SpO2: 95% 96% 95% 94%  Weight: 78.4 kg     Height:        General: Pt is alert, awake, not in acute distress Cardiovascular: IRRR, S1/S2 +, no rubs, no gallops Respiratory: bilateral diminished breath sounds. Fine bibasilar crackles Abdominal: Soft, NT, ND, bowel sounds + Extremities: trace LE edema, no cyanosis   The results of significant diagnostics from this hospitalization (including imaging, microbiology, ancillary and laboratory) are listed below for reference.    Significant Diagnostic Studies: Dg Chest Port 1 View  Result Date: 07/26/2018 CLINICAL DATA:  Shortness of breath and cough EXAM: PORTABLE CHEST 1 VIEW COMPARISON:  06/19/2018 FINDINGS: Cardiac shadow remains enlarged. Postsurgical changes are again seen. Mild vascular congestion is noted slightly more prominent than that seen on the prior exam although no significant effusion or edema is noted. No bony abnormality is noted. IMPRESSION: Mild vascular congestion without significant edema or effusion. Electronically Signed   By: Inez Catalina M.D.   On: 07/26/2018 16:59     Microbiology: No results found for this or any previous visit (from the past 240 hour(s)).   Labs: Basic Metabolic Panel: Recent Labs  Lab 07/29/18 0524 07/30/18 0617 07/31/18 0800 08/01/18 0525 08/01/18 0650 08/02/18 0536  NA 138 137 140 140  --  137   K 4.0 3.2* 2.6* 2.6*  --  2.9*  CL 99 94* 88* 83*  --  77*  CO2 31 32 39* 44*  --  47*  GLUCOSE 157* 159* 129* 119*  --  107*  BUN 27* 30* 36* 44*  --  47*  CREATININE 1.54* 1.41* 1.35* 1.42*  --  1.43*  CALCIUM 8.8* 8.9 9.1 8.9  --  9.1  MG 2.0  --  2.0 2.1  --   --   PHOS  --   --   --   --  4.5  --    Liver Function Tests: Recent Labs  Lab 07/27/18 0115  AST 34  ALT 19  ALKPHOS 76  BILITOT 0.9  PROT 6.4*  ALBUMIN 3.0*   No results for  input(s): LIPASE, AMYLASE in the last 168 hours. No results for input(s): AMMONIA in the last 168 hours. CBC: Recent Labs  Lab 07/27/18 0115 07/28/18 0559 07/29/18 0524 08/01/18 0525 08/02/18 0536  WBC 5.5 10.6* 6.0 7.7 8.7  HGB 9.2* 8.9* 9.1* 8.8* 9.5*  HCT 30.6* 30.2* 32.2* 29.9* 32.4*  MCV 85.5 87.3 86.8 85.2 83.5  PLT 279 281 278 256 267   Cardiac Enzymes: Recent Labs  Lab 07/26/18 1924 07/27/18 0115 07/27/18 0757  TROPONINI <0.03 <0.03 <0.03   BNP: Invalid input(s): POCBNP CBG: No results for input(s): GLUCAP in the last 168 hours.  Time coordinating discharge:  36 minutes  Signed:  Orson Eva, DO [facility] Pager: [phone] 08/02/2018, 4:37 PM

## 2018-08-02 DIAGNOSIS — J9601 Acute respiratory failure with hypoxia: Secondary | ICD-10-CM

## 2018-08-02 LAB — BASIC METABOLIC PANEL
Anion gap: 13 (ref 5–15)
BUN: 47 mg/dL — ABNORMAL HIGH (ref 8–23)
CO2: 47 mmol/L — ABNORMAL HIGH (ref 22–32)
CREATININE: 1.43 mg/dL — AB (ref 0.44–1.00)
Calcium: 9.1 mg/dL (ref 8.9–10.3)
Chloride: 77 mmol/L — ABNORMAL LOW (ref 98–111)
GFR calc non Af Amer: 37 mL/min — ABNORMAL LOW (ref >60)
GFR, EST AFRICAN AMERICAN: 43 mL/min — AB (ref >60)
Glucose, Bld: 107 mg/dL — ABNORMAL HIGH (ref 70–99)
Potassium: 2.9 mmol/L — ABNORMAL LOW (ref 3.5–5.1)
Sodium: 137 mmol/L (ref 135–145)

## 2018-08-02 LAB — CBC
HCT: 32.4 % — ABNORMAL LOW (ref 36.0–46.0)
Hemoglobin: 9.5 g/dL — ABNORMAL LOW (ref 12.0–15.0)
MCH: 24.5 pg — ABNORMAL LOW (ref 26.0–34.0)
MCHC: 29.3 g/dL — ABNORMAL LOW (ref 30.0–36.0)
MCV: 83.5 fL (ref 80.0–100.0)
Platelets: 267 10*3/uL (ref 150–400)
RBC: 3.88 MIL/uL (ref 3.87–5.11)
RDW: 17.3 % — AB (ref 11.5–15.5)
WBC: 8.7 10*3/uL (ref 4.0–10.5)
nRBC: 0 % (ref 0.0–0.2)

## 2018-08-02 LAB — PROTIME-INR
INR: 2.27
Prothrombin Time: 24.9 seconds — ABNORMAL HIGH (ref 11.4–15.2)

## 2018-08-02 MED ORDER — PREDNISONE 10 MG PO TABS: 60.0000 mg | ORAL_TABLET | Freq: Every day | ORAL | 0 refills | Status: DC

## 2018-08-02 MED ORDER — BISOPROLOL FUMARATE 5 MG PO TABS: 5.0000 mg | ORAL_TABLET | Freq: Every day | ORAL | 1 refills | Status: DC

## 2018-08-02 MED ORDER — FUROSEMIDE 80 MG PO TABS: 80.0000 mg | ORAL_TABLET | Freq: Two times a day (BID) | ORAL | 1 refills | Status: DC

## 2018-08-02 MED ORDER — WARFARIN SODIUM 2 MG PO TABS
4.0000 mg | ORAL_TABLET | Freq: Once | ORAL | Status: AC
  Administered 2018-08-02: 4 mg via ORAL
  Filled 2018-08-02: qty 2

## 2018-08-02 NOTE — Care Management Important Message (Signed)
Important Message  Patient Details  Name: [name] MRN: [id number] Date of Birth: [birth year]   Medicare Important Message Given:  Yes    Shelda Altes 08/02/2018, 10:28 AM

## 2018-08-02 NOTE — Progress Notes (Signed)
Patient states understanding of discharge instructions.  

## 2018-08-02 NOTE — Progress Notes (Signed)
[name]   Pharmacy Consult for Warfarin Indication: atrial fibrillation; mechanical aortic valve   Patient Measurements: Height: 5\' 8"  (172.7 cm) Weight: 172 lb 14.4 oz (78.4 kg) IBW/kg (Calculated) : 63.9   Vital Signs: Temp: 98.5 F (36.9 C) (10/14 0729) Temp Source: Oral (10/14 0729) BP: 108/75 (10/14 0729) Pulse Rate: 75 (10/14 0729)  Labs: Recent Labs    07/31/18 0800 08/01/18 0525 08/02/18 0536  HGB  --  8.8* 9.5*  HCT  --  29.9* 32.4*  PLT  --  256 267  LABPROT 26.5* 25.0* 24.9*  INR 2.46 2.29 2.27  CREATININE 1.35* 1.42* 1.43*    Estimated Creatinine Clearance: 42.6 mL/min (A) (by C-G formula based on SCr of 1.43 mg/dL (H)).   Medical History: Past Medical History:  Diagnosis Date  . Acute on chronic diastolic (congestive) heart failure ([place])   . Anxiety disorder   . Aortic stenosis    Status post St. Jude mechanical AVR 2007  . Asthma   . Atrial fibrillation ([place])   . Carcinoid tumor of colon 2007  . Coronary atherosclerosis of native coronary artery    Status post CABG 2007  . GERD (gastroesophageal reflux disease)   . History of colonoscopy 2003   Dr. Gala Romney - normal  . Hyperlipidemia   . Macromastia   . Pedal edema    Bilateral, chronic  . Peptic stricture of esophagus 10/04/2010   GE junction on last EGD by Dr. Gala Romney, benign biopsies  . RLS (restless legs syndrome)   . Schatzki's ring    Assessment: Pharmacy consulted to dose warfarin in patient with atrial fibrillation.  Patient's INR is 2.27 today.  Will continue with home warfarin dose today.  Goal of Therapy:  INR 2-3   Plan:  Give  warfarin 4 mg x 1 due to slight downward trend Daily PT/INR. Monitor for signs and symptoms of bleeding.    [name], Pharm. D. Clinical Pharmacist 08/02/2018 10:23 AM

## 2018-08-03 NOTE — Telephone Encounter (Signed)
Lmom, waiting on a return call.  

## 2018-08-04 NOTE — Progress Notes (Signed)
Cardiology Office Note    Date:  08/09/2018   ID:  [name], DOB 11-Apr-1951, MRN [id number]  PCP:  Octavio Graves, DO  Cardiologist: Rozann Lesches, MD EPS: None  Chief Complaint  Patient presents with  . Hospitalization Follow-up    History of Present Illness:  [name] is a 67 y.o. female with history of CABG and mechanical AVR 2007 on Coumadin, atrial fibrillation, chronic diastolic CHF, COPD.  Patient admitted with acute on chronic diastolic CHF and COPD exacerbation and had minor troponin elevation not consistent with ACS.  2D echo 07/27/2018 normal LVEF 60 to 65% normal functioning Saint Jude mechanical aortic valve.  Could not calculate diastolic dysfunction due to atrial fibrillation. Admission weight 204 pounds discharge weight 08/01/2018 was 172 pounds.  -17.5 L she was sent home on Lasix 80 mg twice daily.  Patient comes in today accompanied by her husband. Being careful with her salt. Weight  Up 1 lb. Denies dyspnea or cardiac complaints.   Past Medical History:  Diagnosis Date  . Acute on chronic diastolic (congestive) heart failure ([place])   . Anxiety disorder   . Aortic stenosis    Status post St. Jude mechanical AVR 2007  . Asthma   . Atrial fibrillation ([place])   . Carcinoid tumor of colon 2007  . Coronary atherosclerosis of native coronary artery    Status post CABG 2007  . GERD (gastroesophageal reflux disease)   . History of colonoscopy 2003   Dr. Gala Romney - normal  . Hyperlipidemia   . Macromastia   . Pedal edema    Bilateral, chronic  . Peptic stricture of esophagus 10/04/2010   GE junction on last EGD by Dr. Gala Romney, benign biopsies  . RLS (restless legs syndrome)   . Schatzki's ring     Past Surgical History:  Procedure Laterality Date  . ABDOMINAL HYSTERECTOMY    . AORTIC VALVE REPLACEMENT  2007   #25 mm St. 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 APPENDECTOMY  2007  . BACK SURGERY       lumbar 4 and 5   . BIOPSY  02/05/2012   RMR:Two tongues of salmon-colored epithelium distal esophagus, very short-segment Barrett's s/p bx/Small hiatal hernia, otherwise normal stomach, D1, D2. Status post esophageal dilation. Biopsy showed GERD.  . Breast cyst removed     bilateral  . BREAST REDUCTION SURGERY    . CESAREAN SECTION    . 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 COLONOSCOPY  02/05/2012   [facility]:DQQIWL rectum, sigmoid diverticulosis,descending colon polyp , tubular adenoma  . CORONARY ARTERY BYPASS GRAFT     06/2006 - RIMA to RCA, SVG to RCA  . ESOPHAGOGASTRODUODENOSCOPY  10/03/2010   Dr. Gala Romney- schatzki's ring, shoft peptic stricture at GE junction.  . ESOPHAGOGASTRODUODENOSCOPY (EGD) WITH PROPOFOL N/A 02/08/2018   Procedure: ESOPHAGOGASTRODUODENOSCOPY (EGD) WITH PROPOFOL;  Surgeon: Daneil Dolin, MD;  Location: [facility];  Service: Endoscopy;  Laterality: N/A;  8:15am  . FOOT SURGERY     bilateral bunionectomy  . HERNIA REPAIR     with mesh  . LAPAROTOMY  2007   small bowel resection secondary to small bowel obstruction  . MALONEY DILATION  02/05/2012   Procedure: Venia Minks DILATION;  Surgeon: Daneil Dolin, MD;  Location: [facility];  Service: Endoscopy;  Laterality: N/A;  21mm   . MALONEY DILATION N/A 02/08/2018   Procedure: Venia Minks DILATION;  Surgeon: Manus Rudd  M, MD;  Location: [facility];  Service: Endoscopy;  Laterality: N/A;  . Teeth removal      Current Medications: Current Meds  Medication Sig  . albuterol (PROVENTIL HFA;VENTOLIN HFA) 108 (90 BASE) MCG/ACT inhaler Inhale 1-2 puffs into the lungs every 6 (six) hours as needed for wheezing or shortness of breath.   . allopurinol (ZYLOPRIM) 100 MG tablet Take 100 mg by mouth daily.  Marland Kitchen ALPRAZolam (XANAX) 0.25 MG tablet Take 1 tablet (0.25 mg total) by mouth 2 (two) times daily as needed for anxiety. (Patient taking differently: Take 1 mg by mouth 2 (two) times daily as needed for anxiety.  )  . benzonatate (TESSALON) 200 MG capsule Take 1 capsule by mouth 3 (three) times daily as needed for cough.   . bisoprolol (ZEBETA) 5 MG tablet Take 1 tablet (5 mg total) by mouth daily.  . budesonide-formoterol (SYMBICORT) 160-4.5 MCG/ACT inhaler Inhale 2 puffs into the lungs 2 (two) times daily.  . calcium carbonate (OS-CAL) 600 MG TABS tablet Take 600 mg by mouth 2 (two) times daily.  . cetirizine (ZYRTEC) 10 MG tablet Take 10 mg by mouth daily.    . CRESTOR 10 MG tablet Take 10 mg by mouth at bedtime.   . cyclobenzaprine (FLEXERIL) 10 MG tablet Take 10 mg by mouth daily as needed for muscle spasms.  Marland Kitchen diltiazem (CARDIZEM CD) 120 MG 24 hr capsule Take 1 capsule (120 mg total) by mouth daily.  . furosemide (LASIX) 80 MG tablet Take 1 tablet (80 mg total) by mouth 2 (two) times daily.  Marland Kitchen HYDROcodone-acetaminophen (NORCO/VICODIN) 5-325 MG tablet Take 1 tablet by mouth daily as needed for moderate pain. Normally takes one tablet at bedtime for back pain  . levothyroxine (SYNTHROID, LEVOTHROID) 50 MCG tablet Take 50 mcg by mouth daily before breakfast.   . magnesium oxide (MAG-OX) 400 MG tablet Take 400 mg by mouth daily.  . Multiple Vitamin (MULTIVITAMIN WITH MINERALS) TABS tablet Take 1 tablet by mouth daily.  Marland Kitchen omeprazole (PRILOSEC) 20 MG capsule Take 1 capsule (20 mg total) by mouth 2 (two) times daily before a meal.  . OXYGEN Inhale 2 L into the lungs daily.   . potassium chloride (KLOR-CON) 20 MEQ packet Take 20 mEq by mouth 2 (two) times daily.   . predniSONE (DELTASONE) 10 MG tablet Take 6 tablets (60 mg total) by mouth daily with breakfast. And decrease by one tablet daily  . rOPINIRole (REQUIP) 3 MG tablet Take 3 mg by mouth 2 (two) times daily.   . temazepam (RESTORIL) 30 MG capsule Take 30 mg by mouth at bedtime as needed for sleep.   Marland Kitchen warfarin (COUMADIN) 2 MG tablet Takes one tablet (2mg ) everyday (Patient taking differently: Take 3-6 mg by mouth See admin instructions. Take 3  tablets (6mg  total) on Tuesdays only. Take 1.5 (3mg  total) tablets on all other days in the evening)     Allergies:   Penicillins   Social History   Socioeconomic History  . Marital status: Married    Spouse name: Not on file  . Number of children: 1  . Years of education: stopped in 10th grade  . Highest education level: GED or equivalent  Occupational History  . Occupation: Retired  Scientific laboratory technician  . Financial resource strain: Not hard at all  . Food insecurity:    Worry: Never true    Inability: Never true  . Transportation needs:    Medical: No    Non-medical: No  Tobacco Use  . Smoking status: Former Smoker    Packs/day: 1.00    Years: 20.00    Pack years: 20.00    Types: Cigarettes    Last attempt to quit: 10/20/1988    Years since quitting: 29.8  . Smokeless tobacco: Never Used  Substance and Sexual Activity  . Alcohol use: No    Alcohol/week: 0.0 standard drinks  . Drug use: No  . Sexual activity: Yes    Partners: Male    Birth control/protection: None    Comment: spouse  Lifestyle  . Physical activity:    Days per week: 0 days    Minutes per session: 0 min  . Stress: Not at all  Relationships  . Social connections:    Talks on phone: Never    Gets together: Never    Attends religious service: More than 4 times per year    Active member of club or organization: No    Attends meetings of clubs or organizations: Never    Relationship status: Married  Other Topics Concern  . Not on file  Social History Narrative  . Not on file     Family History:  The patient's family history includes Cancer in her father; Stroke in her mother.   ROS:   Please see the history of present illness.    Review of Systems  Constitution: Negative.  HENT: Negative.   Eyes: Negative.   Cardiovascular: Positive for dyspnea on exertion.  Respiratory: Negative.   Hematologic/Lymphatic: Negative.   Musculoskeletal: Negative.  Negative for joint pain.  Gastrointestinal:  Negative.   Genitourinary: Negative.   Neurological: Negative.    All other systems reviewed and are negative.   PHYSICAL EXAM:   VS:  BP 118/62   Pulse 83   Ht 5\' 8"  (1.727 m)   Wt 173 lb 9.6 oz (78.7 kg)   SpO2 99%   BMI 26.40 kg/m   Physical Exam  GEN: Well nourished, well developed, in no acute distress  Neck: no JVD, carotid bruits, or masses Cardiac:RRR; no murmurs, rubs, or gallops  Respiratory:  clear to auscultation bilaterally, normal work of breathing GI: soft, nontender, nondistended, + BS TDV:VOHYWV changes decreased distal pulses bilaterally Neuro:  Alert and Oriented x 3 Psych: euthymic mood, full affect  Wt Readings from Last 3 Encounters:  08/09/18 173 lb 9.6 oz (78.7 kg)  08/02/18 172 lb 14.4 oz (78.4 kg)  07/22/18 202 lb (91.6 kg)      Studies/Labs Reviewed:   EKG:  EKG is not ordered today. Ekg shows Atrial fib at 81/m Recent Labs: 07/26/2018: B Natriuretic Peptide 813.0 07/27/2018: ALT 19 08/01/2018: Magnesium 2.1 08/02/2018: BUN 47; Creatinine, Ser 1.43; Hemoglobin 9.5; Platelets 267; Potassium 2.9; Sodium 137   Lipid Panel No results found for: CHOL, TRIG, HDL, CHOLHDL, VLDL, LDLCALC, LDLDIRECT  Additional studies/ records that were reviewed today include:  Echocardiogram 07/27/2018: Study Conclusions   - Left ventricle: The cavity size was normal. Wall thickness was   increased in a pattern of mild LVH. Systolic function was normal.   The estimated ejection fraction was in the range of 60% to 65%.   Wall motion was normal; there were no regional wall motion   abnormalities. The study was not technically sufficient to allow   evaluation of LV diastolic dysfunction due to atrial   fibrillation. - Aortic valve: St. Jude mechanical prosthesis in aortic position.   There was no significant regurgitation. Mean gradient (S): 6 mm   Hg.  Peak gradient (S): 11 mm Hg. Normal prosthetic function based   on gradients. - Mitral valve: Moderately  calcified annulus. There was mild   regurgitation directed eccentrically. - Left atrium: The atrium was severely dilated. - Right ventricle: The cavity size was moderately dilated. Systolic   function was mildly reduced. - Right atrium: The atrium was at the upper limits of normal in   size. Central venous pressure (est): 15 mm Hg. - Atrial septum: No defect or patent foramen ovale was identified. - Tricuspid valve: There was mild-moderate regurgitation. - Pulmonary arteries: PA peak pressure: 39 mm Hg (S). - Pericardium, extracardiac: There was no pericardial effusion.       ASSESSMENT:    1. Chronic diastolic CHF (congestive heart failure) ([place])   2. Permanent atrial fibrillation   3. Coronary artery disease of bypass graft of native heart with stable angina pectoris ([place])   4. H/O mechanical aortic valve replacement   5. CKD (chronic kidney disease) stage 3, GFR 30-59 ml/min ([facility])      PLAN:  In order of problems listed above:  Chronic diastolic CHF discharge weight 172 after recent hospitalization for acute exacerbation.  She diuresed 17.5 L in the hospital. Currently compensated. Will check bmet today on higher dose lasix 80 mg bid. Adjust if needed. 2 gm sodium diet.  Permanent atrial fibrillation on Coumadin rate controlled.  CAD status post CABG and Saint Jude aortic valve replacement functioning normally on recent echo  CKD stage III creatinine 1.43 on 08/02/2018 re check today.    Medication Adjustments/Labs and Tests Ordered: Current medicines are reviewed at length with the patient today.  Concerns regarding medicines are outlined above.  Medication changes, Labs and Tests ordered today are listed in the Patient Instructions below. There are no Patient Instructions on file for this visit.   Signed, Ermalinda Barrios, PA-C  08/09/2018 2:02 PM    [facility] [place], [place], [place]  [address] Phone: [phone]; Fax: [phone]

## 2018-08-04 NOTE — Telephone Encounter (Signed)
Mailed letter. Pt will need to contact our office to discuss lab work needed and Ifobt.

## 2018-08-05 DIAGNOSIS — I130 Hypertensive heart and chronic kidney disease with heart failure and stage 1 through stage 4 chronic kidney disease, or unspecified chronic kidney disease: Secondary | ICD-10-CM | POA: Diagnosis not present

## 2018-08-05 DIAGNOSIS — I110 Hypertensive heart disease with heart failure: Secondary | ICD-10-CM | POA: Diagnosis not present

## 2018-08-05 DIAGNOSIS — J9621 Acute and chronic respiratory failure with hypoxia: Secondary | ICD-10-CM | POA: Diagnosis not present

## 2018-08-05 DIAGNOSIS — K222 Esophageal obstruction: Secondary | ICD-10-CM | POA: Diagnosis not present

## 2018-08-05 DIAGNOSIS — J441 Chronic obstructive pulmonary disease with (acute) exacerbation: Secondary | ICD-10-CM | POA: Diagnosis not present

## 2018-08-05 DIAGNOSIS — E785 Hyperlipidemia, unspecified: Secondary | ICD-10-CM | POA: Diagnosis not present

## 2018-08-05 DIAGNOSIS — K219 Gastro-esophageal reflux disease without esophagitis: Secondary | ICD-10-CM | POA: Diagnosis not present

## 2018-08-05 DIAGNOSIS — R69 Illness, unspecified: Secondary | ICD-10-CM | POA: Diagnosis not present

## 2018-08-05 DIAGNOSIS — N183 Chronic kidney disease, stage 3 (moderate): Secondary | ICD-10-CM | POA: Diagnosis not present

## 2018-08-05 DIAGNOSIS — I2510 Atherosclerotic heart disease of native coronary artery without angina pectoris: Secondary | ICD-10-CM | POA: Diagnosis not present

## 2018-08-05 DIAGNOSIS — I5033 Acute on chronic diastolic (congestive) heart failure: Secondary | ICD-10-CM | POA: Diagnosis not present

## 2018-08-05 DIAGNOSIS — E039 Hypothyroidism, unspecified: Secondary | ICD-10-CM | POA: Diagnosis not present

## 2018-08-05 DIAGNOSIS — I482 Chronic atrial fibrillation, unspecified: Secondary | ICD-10-CM | POA: Diagnosis not present

## 2018-08-09 ENCOUNTER — Ambulatory Visit: Payer: Medicare HMO | Admitting: Physician Assistant

## 2018-08-09 ENCOUNTER — Other Ambulatory Visit (HOSPITAL_COMMUNITY)
Admission: RE | Admit: 2018-08-09 | Discharge: 2018-08-09 | Disposition: A | Discharge status: Home / Self Care | Payer: Medicare HMO | Source: Ambulatory Visit | Attending: Physician Assistant | Admitting: Physician Assistant

## 2018-08-09 ENCOUNTER — Encounter: Payer: Self-pay | Admitting: Physician Assistant

## 2018-08-09 VITALS — BP 118/62 | HR 83 | Ht 68.0 in | Wt 173.6 lb

## 2018-08-09 DIAGNOSIS — I5032 Chronic diastolic (congestive) heart failure: Secondary | ICD-10-CM | POA: Diagnosis not present

## 2018-08-09 DIAGNOSIS — I4821 Permanent atrial fibrillation: Secondary | ICD-10-CM | POA: Diagnosis not present

## 2018-08-09 DIAGNOSIS — N183 Chronic kidney disease, stage 3 unspecified: Secondary | ICD-10-CM

## 2018-08-09 DIAGNOSIS — I25708 Atherosclerosis of coronary artery bypass graft(s), unspecified, with other forms of angina pectoris: Secondary | ICD-10-CM

## 2018-08-09 DIAGNOSIS — Z952 Presence of prosthetic heart valve: Secondary | ICD-10-CM

## 2018-08-09 LAB — BASIC METABOLIC PANEL
Anion gap: 7 (ref 5–15)
BUN: 25 mg/dL — AB (ref 8–23)
CO2: 32 mmol/L (ref 22–32)
CREATININE: 1.03 mg/dL — AB (ref 0.44–1.00)
Calcium: 8.9 mg/dL (ref 8.9–10.3)
Chloride: 97 mmol/L — ABNORMAL LOW (ref 98–111)
GFR calc Af Amer: >60 mL/min (ref >60)
GFR calc non Af Amer: 55 mL/min — ABNORMAL LOW (ref >60)
Glucose, Bld: 85 mg/dL (ref 70–99)
POTASSIUM: 3.8 mmol/L (ref 3.5–5.1)
Sodium: 136 mmol/L (ref 135–145)

## 2018-08-09 NOTE — Patient Instructions (Signed)
Medication Instructions:  Your physician recommends that you continue on your current medications as directed. Please refer to the Current Medication list given to you today.  If you need a refill on your cardiac medications before your next appointment, please call your pharmacy.   Lab work: Your physician recommends that you return for lab work in: Today   If you have labs (blood work) drawn today and your tests are completely normal, you will receive your results only by: . MyChart Message (if you have MyChart) OR . A paper copy in the mail If you have any lab test that is abnormal or we need to change your treatment, we will call you to review the results.  Testing/Procedures: NONE   Follow-Up: At [facility], you and your health needs are our priority.  As part of our continuing mission to provide you with exceptional heart care, we have created designated Provider Care Teams.  These Care Teams include your primary Cardiologist (physician) and Advanced Practice Providers (APPs -  Physician Assistants and Nurse Practitioners) who all work together to provide you with the care you need, when you need it. You will need a follow up appointment in 2 months.  Please call our office 2 months in advance to schedule this appointment.  You may see Samuel McDowell, MD or one of the following Advanced Practice Providers on your designated Care Team:   Brittany Strader, PA-C ([place] Office) . Michele Lenze, PA-C ([place] Office)  Any Other Special Instructions Will Be Listed Below (If Applicable). Thank you for choosing [facility]!     

## 2018-08-10 ENCOUNTER — Telehealth: Payer: Self-pay | Admitting: *Deleted

## 2018-08-10 NOTE — Telephone Encounter (Signed)
Called patient with test results. No answer. Left message to call back.  

## 2018-08-10 NOTE — Telephone Encounter (Signed)
-----   Message from Imogene Burn, PA-C sent at 08/10/2018  7:50 AM EDT ----- Renal function much better. Continue same lasix dose

## 2018-08-11 DIAGNOSIS — E039 Hypothyroidism, unspecified: Secondary | ICD-10-CM | POA: Diagnosis not present

## 2018-08-11 DIAGNOSIS — K219 Gastro-esophageal reflux disease without esophagitis: Secondary | ICD-10-CM | POA: Diagnosis not present

## 2018-08-11 DIAGNOSIS — I482 Chronic atrial fibrillation, unspecified: Secondary | ICD-10-CM | POA: Diagnosis not present

## 2018-08-11 DIAGNOSIS — R69 Illness, unspecified: Secondary | ICD-10-CM | POA: Diagnosis not present

## 2018-08-11 DIAGNOSIS — K222 Esophageal obstruction: Secondary | ICD-10-CM | POA: Diagnosis not present

## 2018-08-11 DIAGNOSIS — I110 Hypertensive heart disease with heart failure: Secondary | ICD-10-CM | POA: Diagnosis not present

## 2018-08-11 DIAGNOSIS — I5033 Acute on chronic diastolic (congestive) heart failure: Secondary | ICD-10-CM | POA: Diagnosis not present

## 2018-08-11 DIAGNOSIS — E785 Hyperlipidemia, unspecified: Secondary | ICD-10-CM | POA: Diagnosis not present

## 2018-08-11 DIAGNOSIS — J441 Chronic obstructive pulmonary disease with (acute) exacerbation: Secondary | ICD-10-CM | POA: Diagnosis not present

## 2018-08-11 DIAGNOSIS — I2510 Atherosclerotic heart disease of native coronary artery without angina pectoris: Secondary | ICD-10-CM | POA: Diagnosis not present

## 2018-08-16 DIAGNOSIS — M9903 Segmental and somatic dysfunction of lumbar region: Secondary | ICD-10-CM | POA: Diagnosis not present

## 2018-08-16 DIAGNOSIS — G2581 Restless legs syndrome: Secondary | ICD-10-CM | POA: Diagnosis not present

## 2018-08-16 DIAGNOSIS — I10 Essential (primary) hypertension: Secondary | ICD-10-CM | POA: Diagnosis not present

## 2018-08-16 DIAGNOSIS — I5042 Chronic combined systolic (congestive) and diastolic (congestive) heart failure: Secondary | ICD-10-CM | POA: Diagnosis not present

## 2018-08-16 DIAGNOSIS — M9909 Segmental and somatic dysfunction of abdomen and other regions: Secondary | ICD-10-CM | POA: Diagnosis not present

## 2018-08-16 DIAGNOSIS — I38 Endocarditis, valve unspecified: Secondary | ICD-10-CM | POA: Diagnosis not present

## 2018-08-16 DIAGNOSIS — M9902 Segmental and somatic dysfunction of thoracic region: Secondary | ICD-10-CM | POA: Diagnosis not present

## 2018-08-16 DIAGNOSIS — R05 Cough: Secondary | ICD-10-CM | POA: Diagnosis not present

## 2018-08-16 DIAGNOSIS — K219 Gastro-esophageal reflux disease without esophagitis: Secondary | ICD-10-CM | POA: Diagnosis not present

## 2018-08-16 DIAGNOSIS — M9901 Segmental and somatic dysfunction of cervical region: Secondary | ICD-10-CM | POA: Diagnosis not present

## 2018-08-17 DIAGNOSIS — K222 Esophageal obstruction: Secondary | ICD-10-CM | POA: Diagnosis not present

## 2018-08-17 DIAGNOSIS — J441 Chronic obstructive pulmonary disease with (acute) exacerbation: Secondary | ICD-10-CM | POA: Diagnosis not present

## 2018-08-17 DIAGNOSIS — I5033 Acute on chronic diastolic (congestive) heart failure: Secondary | ICD-10-CM | POA: Diagnosis not present

## 2018-08-17 DIAGNOSIS — I110 Hypertensive heart disease with heart failure: Secondary | ICD-10-CM | POA: Diagnosis not present

## 2018-08-17 DIAGNOSIS — E785 Hyperlipidemia, unspecified: Secondary | ICD-10-CM | POA: Diagnosis not present

## 2018-08-17 DIAGNOSIS — I2510 Atherosclerotic heart disease of native coronary artery without angina pectoris: Secondary | ICD-10-CM | POA: Diagnosis not present

## 2018-08-17 DIAGNOSIS — E039 Hypothyroidism, unspecified: Secondary | ICD-10-CM | POA: Diagnosis not present

## 2018-08-17 DIAGNOSIS — K219 Gastro-esophageal reflux disease without esophagitis: Secondary | ICD-10-CM | POA: Diagnosis not present

## 2018-08-17 DIAGNOSIS — I482 Chronic atrial fibrillation, unspecified: Secondary | ICD-10-CM | POA: Diagnosis not present

## 2018-08-17 DIAGNOSIS — R69 Illness, unspecified: Secondary | ICD-10-CM | POA: Diagnosis not present

## 2018-08-19 DIAGNOSIS — Z7901 Long term (current) use of anticoagulants: Secondary | ICD-10-CM | POA: Diagnosis not present

## 2018-08-19 DIAGNOSIS — I5033 Acute on chronic diastolic (congestive) heart failure: Secondary | ICD-10-CM | POA: Diagnosis not present

## 2018-08-19 DIAGNOSIS — J441 Chronic obstructive pulmonary disease with (acute) exacerbation: Secondary | ICD-10-CM | POA: Diagnosis not present

## 2018-08-20 DIAGNOSIS — I482 Chronic atrial fibrillation, unspecified: Secondary | ICD-10-CM | POA: Diagnosis not present

## 2018-08-20 DIAGNOSIS — R69 Illness, unspecified: Secondary | ICD-10-CM | POA: Diagnosis not present

## 2018-08-20 DIAGNOSIS — K222 Esophageal obstruction: Secondary | ICD-10-CM | POA: Diagnosis not present

## 2018-08-20 DIAGNOSIS — E039 Hypothyroidism, unspecified: Secondary | ICD-10-CM | POA: Diagnosis not present

## 2018-08-20 DIAGNOSIS — K219 Gastro-esophageal reflux disease without esophagitis: Secondary | ICD-10-CM | POA: Diagnosis not present

## 2018-08-20 DIAGNOSIS — E785 Hyperlipidemia, unspecified: Secondary | ICD-10-CM | POA: Diagnosis not present

## 2018-08-20 DIAGNOSIS — I110 Hypertensive heart disease with heart failure: Secondary | ICD-10-CM | POA: Diagnosis not present

## 2018-08-20 DIAGNOSIS — I5033 Acute on chronic diastolic (congestive) heart failure: Secondary | ICD-10-CM | POA: Diagnosis not present

## 2018-08-20 DIAGNOSIS — I2510 Atherosclerotic heart disease of native coronary artery without angina pectoris: Secondary | ICD-10-CM | POA: Diagnosis not present

## 2018-08-20 DIAGNOSIS — J441 Chronic obstructive pulmonary disease with (acute) exacerbation: Secondary | ICD-10-CM | POA: Diagnosis not present

## 2018-08-23 DIAGNOSIS — I5033 Acute on chronic diastolic (congestive) heart failure: Secondary | ICD-10-CM | POA: Diagnosis not present

## 2018-08-23 DIAGNOSIS — J441 Chronic obstructive pulmonary disease with (acute) exacerbation: Secondary | ICD-10-CM | POA: Diagnosis not present

## 2018-08-23 DIAGNOSIS — I482 Chronic atrial fibrillation, unspecified: Secondary | ICD-10-CM | POA: Diagnosis not present

## 2018-08-23 DIAGNOSIS — E039 Hypothyroidism, unspecified: Secondary | ICD-10-CM | POA: Diagnosis not present

## 2018-08-23 DIAGNOSIS — I2510 Atherosclerotic heart disease of native coronary artery without angina pectoris: Secondary | ICD-10-CM | POA: Diagnosis not present

## 2018-08-23 DIAGNOSIS — E785 Hyperlipidemia, unspecified: Secondary | ICD-10-CM | POA: Diagnosis not present

## 2018-08-23 DIAGNOSIS — R69 Illness, unspecified: Secondary | ICD-10-CM | POA: Diagnosis not present

## 2018-08-23 DIAGNOSIS — K222 Esophageal obstruction: Secondary | ICD-10-CM | POA: Diagnosis not present

## 2018-08-23 DIAGNOSIS — I110 Hypertensive heart disease with heart failure: Secondary | ICD-10-CM | POA: Diagnosis not present

## 2018-08-23 DIAGNOSIS — K219 Gastro-esophageal reflux disease without esophagitis: Secondary | ICD-10-CM | POA: Diagnosis not present

## 2018-08-26 DIAGNOSIS — E039 Hypothyroidism, unspecified: Secondary | ICD-10-CM | POA: Diagnosis not present

## 2018-08-26 DIAGNOSIS — E8881 Metabolic syndrome: Secondary | ICD-10-CM | POA: Diagnosis not present

## 2018-08-26 DIAGNOSIS — G2581 Restless legs syndrome: Secondary | ICD-10-CM | POA: Diagnosis not present

## 2018-08-26 DIAGNOSIS — I10 Essential (primary) hypertension: Secondary | ICD-10-CM | POA: Diagnosis not present

## 2018-08-26 DIAGNOSIS — I38 Endocarditis, valve unspecified: Secondary | ICD-10-CM | POA: Diagnosis not present

## 2018-08-26 DIAGNOSIS — I509 Heart failure, unspecified: Secondary | ICD-10-CM | POA: Diagnosis not present

## 2018-08-26 DIAGNOSIS — M5136 Other intervertebral disc degeneration, lumbar region: Secondary | ICD-10-CM | POA: Diagnosis not present

## 2018-08-31 DIAGNOSIS — J441 Chronic obstructive pulmonary disease with (acute) exacerbation: Secondary | ICD-10-CM | POA: Diagnosis not present

## 2018-08-31 DIAGNOSIS — I110 Hypertensive heart disease with heart failure: Secondary | ICD-10-CM | POA: Diagnosis not present

## 2018-08-31 DIAGNOSIS — E039 Hypothyroidism, unspecified: Secondary | ICD-10-CM | POA: Diagnosis not present

## 2018-08-31 DIAGNOSIS — K219 Gastro-esophageal reflux disease without esophagitis: Secondary | ICD-10-CM | POA: Diagnosis not present

## 2018-08-31 DIAGNOSIS — I2510 Atherosclerotic heart disease of native coronary artery without angina pectoris: Secondary | ICD-10-CM | POA: Diagnosis not present

## 2018-08-31 DIAGNOSIS — K222 Esophageal obstruction: Secondary | ICD-10-CM | POA: Diagnosis not present

## 2018-08-31 DIAGNOSIS — R69 Illness, unspecified: Secondary | ICD-10-CM | POA: Diagnosis not present

## 2018-08-31 DIAGNOSIS — I5033 Acute on chronic diastolic (congestive) heart failure: Secondary | ICD-10-CM | POA: Diagnosis not present

## 2018-08-31 DIAGNOSIS — I482 Chronic atrial fibrillation, unspecified: Secondary | ICD-10-CM | POA: Diagnosis not present

## 2018-08-31 DIAGNOSIS — E785 Hyperlipidemia, unspecified: Secondary | ICD-10-CM | POA: Diagnosis not present

## 2018-09-01 ENCOUNTER — Inpatient Hospital Stay (HOSPITAL_COMMUNITY)
Admission: EM | Admit: 2018-09-01 | Discharge: 2018-09-05 | DRG: 291 | Disposition: A | Discharge status: Home Health | Payer: Medicare HMO | Attending: Internal Medicine | Admitting: Internal Medicine

## 2018-09-01 ENCOUNTER — Emergency Department (HOSPITAL_COMMUNITY): Payer: Medicare HMO

## 2018-09-01 ENCOUNTER — Encounter (HOSPITAL_COMMUNITY): Payer: Self-pay | Admitting: Emergency Medicine

## 2018-09-01 ENCOUNTER — Other Ambulatory Visit: Payer: Self-pay

## 2018-09-01 DIAGNOSIS — I130 Hypertensive heart and chronic kidney disease with heart failure and stage 1 through stage 4 chronic kidney disease, or unspecified chronic kidney disease: Secondary | ICD-10-CM | POA: Diagnosis not present

## 2018-09-01 DIAGNOSIS — E785 Hyperlipidemia, unspecified: Secondary | ICD-10-CM | POA: Diagnosis present

## 2018-09-01 DIAGNOSIS — E039 Hypothyroidism, unspecified: Secondary | ICD-10-CM | POA: Diagnosis not present

## 2018-09-01 DIAGNOSIS — E876 Hypokalemia: Secondary | ICD-10-CM | POA: Diagnosis present

## 2018-09-01 DIAGNOSIS — I482 Chronic atrial fibrillation, unspecified: Secondary | ICD-10-CM | POA: Diagnosis not present

## 2018-09-01 DIAGNOSIS — M109 Gout, unspecified: Secondary | ICD-10-CM | POA: Diagnosis present

## 2018-09-01 DIAGNOSIS — D649 Anemia, unspecified: Secondary | ICD-10-CM | POA: Diagnosis not present

## 2018-09-01 DIAGNOSIS — J9621 Acute and chronic respiratory failure with hypoxia: Secondary | ICD-10-CM | POA: Diagnosis not present

## 2018-09-01 DIAGNOSIS — I5023 Acute on chronic systolic (congestive) heart failure: Secondary | ICD-10-CM | POA: Diagnosis not present

## 2018-09-01 DIAGNOSIS — J9601 Acute respiratory failure with hypoxia: Secondary | ICD-10-CM

## 2018-09-01 DIAGNOSIS — J441 Chronic obstructive pulmonary disease with (acute) exacerbation: Secondary | ICD-10-CM | POA: Diagnosis not present

## 2018-09-01 DIAGNOSIS — Z880 Allergy status to penicillin: Secondary | ICD-10-CM

## 2018-09-01 DIAGNOSIS — I110 Hypertensive heart disease with heart failure: Secondary | ICD-10-CM | POA: Diagnosis not present

## 2018-09-01 DIAGNOSIS — J90 Pleural effusion, not elsewhere classified: Secondary | ICD-10-CM | POA: Diagnosis not present

## 2018-09-01 DIAGNOSIS — I509 Heart failure, unspecified: Secondary | ICD-10-CM

## 2018-09-01 DIAGNOSIS — Z952 Presence of prosthetic heart valve: Secondary | ICD-10-CM

## 2018-09-01 DIAGNOSIS — I2510 Atherosclerotic heart disease of native coronary artery without angina pectoris: Secondary | ICD-10-CM | POA: Diagnosis present

## 2018-09-01 DIAGNOSIS — Z9981 Dependence on supplemental oxygen: Secondary | ICD-10-CM

## 2018-09-01 DIAGNOSIS — K219 Gastro-esophageal reflux disease without esophagitis: Secondary | ICD-10-CM | POA: Diagnosis present

## 2018-09-01 DIAGNOSIS — I5033 Acute on chronic diastolic (congestive) heart failure: Secondary | ICD-10-CM | POA: Diagnosis not present

## 2018-09-01 DIAGNOSIS — I10 Essential (primary) hypertension: Secondary | ICD-10-CM

## 2018-09-01 DIAGNOSIS — Z7951 Long term (current) use of inhaled steroids: Secondary | ICD-10-CM

## 2018-09-01 DIAGNOSIS — Z87891 Personal history of nicotine dependence: Secondary | ICD-10-CM

## 2018-09-01 DIAGNOSIS — Z951 Presence of aortocoronary bypass graft: Secondary | ICD-10-CM

## 2018-09-01 DIAGNOSIS — F419 Anxiety disorder, unspecified: Secondary | ICD-10-CM | POA: Diagnosis present

## 2018-09-01 DIAGNOSIS — Z7901 Long term (current) use of anticoagulants: Secondary | ICD-10-CM

## 2018-09-01 DIAGNOSIS — N183 Chronic kidney disease, stage 3 (moderate): Secondary | ICD-10-CM | POA: Diagnosis present

## 2018-09-01 DIAGNOSIS — G2581 Restless legs syndrome: Secondary | ICD-10-CM | POA: Diagnosis present

## 2018-09-01 DIAGNOSIS — Z79899 Other long term (current) drug therapy: Secondary | ICD-10-CM

## 2018-09-01 DIAGNOSIS — I5043 Acute on chronic combined systolic (congestive) and diastolic (congestive) heart failure: Secondary | ICD-10-CM | POA: Diagnosis present

## 2018-09-01 LAB — CBC WITH DIFFERENTIAL/PLATELET
ABS IMMATURE GRANULOCYTES: 0.05 10*3/uL (ref 0.00–0.07)
BASOS ABS: 0 10*3/uL (ref 0.0–0.1)
Basophils Relative: 0 %
EOS ABS: 0 10*3/uL (ref 0.0–0.5)
Eosinophils Relative: 0 %
HEMATOCRIT: 33.9 % — AB (ref 36.0–46.0)
Hemoglobin: 9.5 g/dL — ABNORMAL LOW (ref 12.0–15.0)
IMMATURE GRANULOCYTES: 1 %
LYMPHS PCT: 12 %
Lymphs Abs: 1.2 10*3/uL (ref 0.7–4.0)
MCH: 25.4 pg — ABNORMAL LOW (ref 26.0–34.0)
MCHC: 28 g/dL — ABNORMAL LOW (ref 30.0–36.0)
MCV: 90.6 fL (ref 80.0–100.0)
MONO ABS: 1.1 10*3/uL — AB (ref 0.1–1.0)
MONOS PCT: 11 %
NEUTROS ABS: 7.5 10*3/uL (ref 1.7–7.7)
Neutrophils Relative %: 76 %
Platelets: 332 10*3/uL (ref 150–400)
RBC: 3.74 MIL/uL — ABNORMAL LOW (ref 3.87–5.11)
RDW: 20.3 % — AB (ref 11.5–15.5)
WBC: 10 10*3/uL (ref 4.0–10.5)
nRBC: 0 % (ref 0.0–0.2)

## 2018-09-01 LAB — BASIC METABOLIC PANEL
ANION GAP: 9 (ref 5–15)
BUN: 12 mg/dL (ref 8–23)
CALCIUM: 8.6 mg/dL — AB (ref 8.9–10.3)
CO2: 32 mmol/L (ref 22–32)
Chloride: 98 mmol/L (ref 98–111)
Creatinine, Ser: 1.11 mg/dL — ABNORMAL HIGH (ref 0.44–1.00)
GFR calc non Af Amer: 51 mL/min — ABNORMAL LOW (ref >60)
GFR, EST AFRICAN AMERICAN: 59 mL/min — AB (ref >60)
Glucose, Bld: 100 mg/dL — ABNORMAL HIGH (ref 70–99)
Potassium: 3.2 mmol/L — ABNORMAL LOW (ref 3.5–5.1)
Sodium: 139 mmol/L (ref 135–145)

## 2018-09-01 LAB — PROTIME-INR
INR: 2.8
Prothrombin Time: 29.1 seconds — ABNORMAL HIGH (ref 11.4–15.2)

## 2018-09-01 LAB — BRAIN NATRIURETIC PEPTIDE: B Natriuretic Peptide: 958 pg/mL — ABNORMAL HIGH (ref 0.0–100.0)

## 2018-09-01 MED ORDER — FUROSEMIDE 10 MG/ML IJ SOLN: 80.0000 mg | Freq: Once | INTRAMUSCULAR | Status: DC

## 2018-09-01 MED ORDER — ACETAMINOPHEN 325 MG PO TABS
650.0000 mg | ORAL_TABLET | Freq: Once | ORAL | Status: AC
  Administered 2018-09-01: 650 mg via ORAL
  Filled 2018-09-01: qty 2

## 2018-09-01 MED ORDER — ALBUTEROL SULFATE (2.5 MG/3ML) 0.083% IN NEBU
5.0000 mg | INHALATION_SOLUTION | Freq: Once | RESPIRATORY_TRACT | Status: AC
  Administered 2018-09-01: 5 mg via RESPIRATORY_TRACT
  Filled 2018-09-01: qty 6

## 2018-09-01 MED ORDER — FUROSEMIDE 10 MG/ML IJ SOLN
80.0000 mg | Freq: Once | INTRAMUSCULAR | Status: AC
  Administered 2018-09-01: 80 mg via INTRAVENOUS
  Filled 2018-09-01: qty 8

## 2018-09-01 MED ORDER — POTASSIUM CHLORIDE CRYS ER 20 MEQ PO TBCR
40.0000 meq | EXTENDED_RELEASE_TABLET | Freq: Once | ORAL | Status: AC
  Administered 2018-09-01: 40 meq via ORAL
  Filled 2018-09-01: qty 2

## 2018-09-01 MED ORDER — IPRATROPIUM-ALBUTEROL 0.5-2.5 (3) MG/3ML IN SOLN
3.0000 mL | Freq: Once | RESPIRATORY_TRACT | Status: AC
  Administered 2018-09-01: 3 mL via RESPIRATORY_TRACT
  Filled 2018-09-01: qty 3

## 2018-09-01 NOTE — ED Notes (Signed)
Ambulated Pt in hall with walker monitoring O2 Sats. MD present. Pts O2 Sats 87%.

## 2018-09-01 NOTE — ED Provider Notes (Addendum)
[facility] EMERGENCY DEPARTMENT Provider Note   CSN: [id number] Arrival date & time: 09/01/18  1639     History   Chief Complaint Chief Complaint  Patient presents with  . Shortness of Breath    HPI [name] is a 67 y.o. female.  Planes of shortness of breath onset approximately noon today.  She is unable to determine whether shortness of breath feels more like COPD or like CHF.  Nothing makes symptoms better or worse she treated herself with her usual morning medicines, without relief.  She denies cough denies fever denies chest pain denies pain anywhere.  No other associated symptoms.  Nothing makes symptoms better or worse sleeps on one pillow.   HPI  Past Medical History:  Diagnosis Date  . Acute on chronic diastolic (congestive) heart failure ([place])   . Anxiety disorder   . Aortic stenosis    Status post St. Jude mechanical AVR 2007  . Asthma   . Atrial fibrillation ([place])   . Carcinoid tumor of colon 2007  . Coronary atherosclerosis of native coronary artery    Status post CABG 2007  . GERD (gastroesophageal reflux disease)   . History of colonoscopy 2003   Dr. Gala Romney - normal  . Hyperlipidemia   . Macromastia   . Pedal edema    Bilateral, chronic  . Peptic stricture of esophagus 10/04/2010   GE junction on last EGD by Dr. Gala Romney, benign biopsies  . RLS (restless legs syndrome)   . Schatzki's ring     Patient Active Problem List   Diagnosis Date Noted  . CHF (congestive heart failure) ([place]) 07/26/2018  . Transaminitis 07/23/2018  . Acute on chronic respiratory failure with hypoxia and hypercapnia ([place]) 06/19/2018  . Acute respiratory failure with hypoxia ([place])   . Supratherapeutic INR 05/03/2018  . CHF exacerbation ([place]) 05/02/2018  . Acute metabolic encephalopathy 95/28/4132  . CKD (chronic kidney disease) stage 3, GFR 30-59 ml/min (H[facility]) 05/02/2018  . Hypothyroidism 05/02/2018  . Generalized weakness 05/02/2018  . Physical deconditioning   .  Coronary artery disease of bypass graft of native heart with stable angina pectoris ([place])   . Acute renal failure superimposed on stage 3 chronic kidney disease ([place])   . Acute on chronic respiratory failure with hypercapnia ([place]) 03/24/2018  . Hypomagnesemia 03/24/2018  . Hypokalemia 03/24/2018  . Hypophosphatemia 03/24/2018  . Atrial fibrillation with RVR ([place]) 03/24/2018  . Elevated troponin 03/24/2018  . Coughing 01/14/2018  . Anemia 01/14/2018  . Hx of adenomatous colonic polyps 01/14/2018  . Acute on chronic diastolic congestive heart failure ([place]) 12/17/2016  . COPD exacerbation ([place]) 12/17/2016  . Bilateral leg edema 07/05/2014  . Encounter for therapeutic drug monitoring 11/15/2013  . Chronic venous insufficiency 08/26/2011  . Chronic anticoagulation 01/11/2011  . Peptic stricture of esophagus 10/04/2010  . GERD 09/16/2010  . DYSPHAGIA 09/16/2010  . H/O mechanical aortic valve replacement 06/05/2009  . Hyperlipidemia 06/04/2009  . Coronary atherosclerosis of native coronary artery 06/04/2009  . Atrial fibrillation ([place]) 06/04/2009    Past Surgical History:  Procedure Laterality Date  . ABDOMINAL HYSTERECTOMY    . AORTIC VALVE REPLACEMENT  2007   #25 mm St. 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 APPENDECTOMY  2007  . BACK SURGERY     lumbar 4 and 5   . BIOPSY  02/05/2012   RMR:Two tongues of salmon-colored epithelium distal esophagus, very short-segment Barrett's s/p bx/Small hiatal hernia, otherwise normal stomach, D1,  D2. Status post esophageal dilation. Biopsy showed GERD.  . Breast cyst removed     bilateral  . BREAST REDUCTION SURGERY    . CESAREAN SECTION    . 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 COLONOSCOPY  02/05/2012   ZOX:WRUEAV rectum, sigmoid diverticulosis,descending colon polyp , tubular adenoma  . CORONARY ARTERY BYPASS GRAFT     06/2006 - RIMA to RCA, SVG to RCA  .  ESOPHAGOGASTRODUODENOSCOPY  10/03/2010   Dr. Gala Romney- schatzki's ring, shoft peptic stricture at GE junction.  . ESOPHAGOGASTRODUODENOSCOPY (EGD) WITH PROPOFOL N/A 02/08/2018   Procedure: ESOPHAGOGASTRODUODENOSCOPY (EGD) WITH PROPOFOL;  Surgeon: Daneil Dolin, MD;  Location: [facility] ENDO SUITE;  Service: Endoscopy;  Laterality: N/A;  8:15am  . FOOT SURGERY     bilateral bunionectomy  . HERNIA REPAIR     with mesh  . LAPAROTOMY  2007   small bowel resection secondary to small bowel obstruction  . MALONEY DILATION  02/05/2012   Procedure: Venia Minks DILATION;  Surgeon: Daneil Dolin, MD;  Location: [facility] ORS;  Service: Endoscopy;  Laterality: N/A;  14mm   . MALONEY DILATION N/A 02/08/2018   Procedure: Venia Minks DILATION;  Surgeon: Daneil Dolin, MD;  Location: [facility] ENDO SUITE;  Service: Endoscopy;  Laterality: N/A;  . Teeth removal       OB History    Gravida  3   Para  3   Term  3   Preterm      AB      Living  1     SAB      TAB      Ectopic      Multiple      Live Births               Home Medications    Prior to Admission medications   Medication Sig Start Date End Date Taking? Authorizing Provider  albuterol (PROVENTIL HFA;VENTOLIN HFA) 108 (90 BASE) MCG/ACT inhaler Inhale 1-2 puffs into the lungs every 6 (six) hours as needed for wheezing or shortness of breath.     [provider]  allopurinol (ZYLOPRIM) 100 MG tablet Take 100 mg by mouth daily.    [provider]  ALPRAZolam Duanne Moron) 0.25 MG tablet Take 1 tablet (0.25 mg total) by mouth 2 (two) times daily as needed for anxiety. Patient taking differently: Take 1 mg by mouth 2 (two) times daily as needed for anxiety.  05/05/18   Johnson, Clanford L, MD  benzonatate (TESSALON) 200 MG capsule Take 1 capsule by mouth 3 (three) times daily as needed for cough.  06/14/18   [provider]  bisoprolol (ZEBETA) 5 MG tablet Take 1 tablet (5 mg total) by mouth daily. 08/03/18   Orson Eva, MD    budesonide-formoterol (SYMBICORT) 160-4.5 MCG/ACT inhaler Inhale 2 puffs into the lungs 2 (two) times daily. 03/31/18   Barton Dubois, MD  calcium carbonate (OS-CAL) 600 MG TABS tablet Take 600 mg by mouth 2 (two) times daily.    [provider]  cetirizine (ZYRTEC) 10 MG tablet Take 10 mg by mouth daily.      [provider]  CRESTOR 10 MG tablet Take 10 mg by mouth at bedtime.  12/02/13   [provider]  cyclobenzaprine (FLEXERIL) 10 MG tablet Take 10 mg by mouth daily as needed for muscle spasms.    [provider]  diltiazem (CARDIZEM CD) 120 MG 24 hr capsule Take 1 capsule (120 mg  total) by mouth daily. 04/01/18   Barton Dubois, MD  furosemide (LASIX) 80 MG tablet Take 1 tablet (80 mg total) by mouth 2 (two) times daily. 08/02/18   Orson Eva, MD  HYDROcodone-acetaminophen (NORCO/VICODIN) 5-325 MG tablet Take 1 tablet by mouth daily as needed for moderate pain. Normally takes one tablet at bedtime for back pain    [provider]  levothyroxine (SYNTHROID, LEVOTHROID) 50 MCG tablet Take 50 mcg by mouth daily before breakfast.     [provider]  magnesium oxide (MAG-OX) 400 MG tablet Take 400 mg by mouth daily.    [provider]  Multiple Vitamin (MULTIVITAMIN WITH MINERALS) TABS tablet Take 1 tablet by mouth daily.    [provider]  omeprazole (PRILOSEC) 20 MG capsule Take 1 capsule (20 mg total) by mouth 2 (two) times daily before a meal. 07/22/18   Mahala Menghini, PA-C  OXYGEN Inhale 2 L into the lungs daily.     [provider]  potassium chloride (KLOR-CON) 20 MEQ packet Take 20 mEq by mouth 2 (two) times daily.     [provider]  predniSONE (DELTASONE) 10 MG tablet Take 6 tablets (60 mg total) by mouth daily with breakfast. And decrease by one tablet daily 08/03/18   Tat, Shanon Brow, MD  rOPINIRole (REQUIP) 3 MG tablet Take 3 mg by mouth 2 (two) times daily.     [provider]  temazepam  (RESTORIL) 30 MG capsule Take 30 mg by mouth at bedtime as needed for sleep.  06/24/18   [provider]  warfarin (COUMADIN) 2 MG tablet Takes one tablet (2mg ) everyday Patient taking differently: Take 3-6 mg by mouth See admin instructions. Take 3 tablets (6mg  total) on Tuesdays only. Take 1.5 (3mg  total) tablets on all other days in the evening 05/08/18   Murlean Iba, MD    Family History Family History  Problem Relation Age of Onset  . Stroke Mother   . Cancer Father   . Anesthesia problems Neg Hx   . Hypotension Neg Hx   . Malignant hyperthermia Neg Hx   . Pseudochol deficiency Neg Hx     Social History Social History   Tobacco Use  . Smoking status: Former Smoker    Packs/day: 1.00    Years: 20.00    Pack years: 20.00    Types: Cigarettes    Last attempt to quit: 10/20/1988    Years since quitting: 29.8  . Smokeless tobacco: Never Used  Substance Use Topics  . Alcohol use: No    Alcohol/week: 0.0 standard drinks  . Drug use: No     Allergies   Penicillins   Review of Systems Review of Systems  Constitutional: Negative.   HENT: Negative.   Respiratory: Positive for shortness of breath.   Cardiovascular: Negative.   Gastrointestinal: Negative.   Musculoskeletal: Negative.   Skin: Negative.   Neurological: Negative.   Hematological: Bruises/bleeds easily.  Psychiatric/Behavioral: Negative.   All other systems reviewed and are negative.    Physical Exam Updated Vital Signs BP 117/86 (BP Location: Right Arm)   Pulse 86   Temp 98.2 F (36.8 C) (Oral)   Resp (!) 21   Ht 5\' 8"  (1.727 m)   Wt 87.1 kg   SpO2 92%   BMI 29.19 kg/m   Physical Exam  Constitutional: She appears well-developed and well-nourished.  HENT:  Head: Normocephalic and atraumatic.  Eyes: Pupils are equal, round, and reactive to light. Conjunctivae are normal.  Neck: Neck supple. No JVD present. No tracheal deviation present. No thyromegaly present.  Cardiovascular:  Normal rate and intact distal pulses.  No murmur heard. Irregularly irregular  Pulmonary/Chest: She has wheezes. She has rales.  Mild respiratory distress end expiratory wheezes.  Rales at bases  Abdominal: Soft. Bowel sounds are normal. She exhibits no distension. There is no tenderness.  Musculoskeletal: Normal range of motion. She exhibits no edema or tenderness.  Neurological: She is alert. Coordination normal.  Skin: Skin is warm and dry. No rash noted.  Chronic appearing brawny skin changes of bilateral lower extremities below the knees  Psychiatric: She has a normal mood and affect.  Nursing note and vitals reviewed.    ED Treatments / Results  Labs (all labs ordered are listed, but only abnormal results are displayed) Labs Reviewed - No data to display  EKG EKG Interpretation  Date/Time:  Wednesday September 01 2018 17:02:05 EST Ventricular Rate:  85 PR Interval:    QRS Duration: 70 QT Interval:  499 QTC Calculation: 580 R Axis:   84 Text Interpretation:  Atrial fibrillation Borderline right axis deviation Borderline repolarization abnormality Prolonged QT interval Baseline wander in lead(s) V1 No significant change since last tracing Confirmed by Orlie Dakin [phone]) on 09/01/2018 5:06:40 PM   Radiology No results found.  Procedures Procedures (including critical care time)  Medications Ordered in ED Medications  albuterol (PROVENTIL) (2.5 MG/3ML) 0.083% nebulizer solution 5 mg (has no administration in time range)   Chest x-ray viewed by me Results for orders placed or performed during the hospital encounter of 09/01/18  Brain natriuretic peptide  Result Value Ref Range   B Natriuretic Peptide 958.0 (H) 0.0 - 100.0 pg/mL  CBC with Differential/Platelet  Result Value Ref Range   WBC 10.0 4.0 - 10.5 K/uL   RBC 3.74 (L) 3.87 - 5.11 MIL/uL   Hemoglobin 9.5 (L) 12.0 - 15.0 g/dL   HCT 33.9 (L) 36.0 - 46.0 %   MCV 90.6 80.0 - 100.0 fL   MCH 25.4 (L) 26.0 -  34.0 pg   MCHC 28.0 (L) 30.0 - 36.0 g/dL   RDW 20.3 (H) 11.5 - 15.5 %   Platelets 332 150 - 400 K/uL   nRBC 0.0 0.0 - 0.2 %   Neutrophils Relative % 76 %   Neutro Abs 7.5 1.7 - 7.7 K/uL   Lymphocytes Relative 12 %   Lymphs Abs 1.2 0.7 - 4.0 K/uL   Monocytes Relative 11 %   Monocytes Absolute 1.1 (H) 0.1 - 1.0 K/uL   Eosinophils Relative 0 %   Eosinophils Absolute 0.0 0.0 - 0.5 K/uL   Basophils Relative 0 %   Basophils Absolute 0.0 0.0 - 0.1 K/uL   Immature Granulocytes 1 %   Abs Immature Granulocytes 0.05 0.00 - 0.07 K/uL  Basic metabolic panel  Result Value Ref Range   Sodium 139 135 - 145 mmol/L   Potassium 3.2 (L) 3.5 - 5.1 mmol/L   Chloride 98 98 - 111 mmol/L   CO2 32 22 - 32 mmol/L   Glucose, Bld 100 (H) 70 - 99 mg/dL   BUN 12 8 - 23 mg/dL   Creatinine, Ser 1.11 (H) 0.44 - 1.00 mg/dL   Calcium 8.6 (L) 8.9 - 10.3 mg/dL   GFR calc non Af Amer 51 (L) >60 mL/min   GFR calc Af Amer 59 (L) >60 mL/min   Anion gap 9 5 - 15  Protime-INR  Result Value Ref Range   Prothrombin Time  29.1 (H) 11.4 - 15.2 seconds   INR 2.80    Dg Chest Port 1 View  Result Date: 09/01/2018 CLINICAL DATA:  Lower extremity and periorbital edema. EXAM: PORTABLE CHEST 1 VIEW COMPARISON:  07/26/2018 FINDINGS: Unchanged moderate cardiomegaly with findings of prior median sternotomy and valve replacement. There is mild interstitial pulmonary edema. Small right pleural effusion with associated atelectasis. IMPRESSION: Cardiomegaly with mild interstitial pulmonary edema and small right pleural effusion. Electronically Signed   By: Ulyses Jarred M.D.   On: 09/01/2018 17:47  Palliative care consult ordered Initial Impression / Assessment and Plan / ED Course  I have reviewed the triage vital signs and the nursing notes.  Pertinent labs & imaging results that were available during my care of the patient were reviewed by me and considered in my medical decision making (see chart for details).     10:35 PM  patient ambulated with her oxygen with a walker after treatment with intravenous Lasix.  She stated her breathing was better however she became dyspneic after walking a few steps and pulse oximetry desaturated to 87% on 2 L of oxygen, consistent with hypoxia. Lab work consistent with anemia which is unchanged from 1 month ago,, hypokalemia and elevated BNP consistent with congestive heart failure which she received oral potassium supplementation.  Dr. Darrick Meigs from hospitalist service and will arrange for overnight stay Final Clinical Impressions(s) / ED Diagnoses  Diagnosis #1 acute on chronic congestive heart failure #2 hypoxia #3 anemia #4 hypokalemia CRITICAL CARE Performed by: Orlie Dakin Total critical care time: 30 minutes Critical care time was exclusive of separately billable procedures and treating other patients. Critical care was necessary to treat or prevent imminent or life-threatening deterioration. Critical care was time spent personally by me on the following activities: development of treatment plan with patient and/or surrogate as well as nursing, discussions with consultants, evaluation of patient's response to treatment, examination of patient, obtaining history from patient or surrogate, ordering and performing treatments and interventions, ordering and review of laboratory studies, ordering and review of radiographic studies, pulse oximetry and re-evaluation of patient's condition. Final diagnoses:  None    ED Discharge Orders    None       Orlie Dakin, MD 09/01/18 1700    Orlie Dakin, MD 09/01/18 2245

## 2018-09-01 NOTE — ED Triage Notes (Signed)
Patient reports SOB since this am. LE and periorbital edema noted. Patient with tachypnea and difficulty talking. Irregular pulse noted.

## 2018-09-01 NOTE — ED Notes (Signed)
RT notified for neb tx. 

## 2018-09-01 NOTE — H&P (Signed)
TRH H&P    Patient Demographics:    [name], is a 67 y.o. female  MRN: [id number]  DOB - 1951-03-09  Admit Date - 09/01/2018  Referring MD/NP/PA: Dr. Winfred Leeds  Outpatient Primary MD for the patient is Octavio Graves, DO  Patient coming from: Home  Chief complaint-shortness of breath   HPI:    [name]  is a 67 y.o. female, with history of chronic diastolic CHF, aortic stenosis status post St. Jude's mechanical AVR in 2007, atrial fibrillation, CAD status post CABG 2007, CKD stage III who came to ED with chief complaint of shortness of breath.  She denies coughing up any phlegm.  Denies chest pain.  No nausea vomiting or diarrhea. In the ED, patient labs showed BNP 958, chest x-ray showed pulmonary edema, patient given 1 dose of Lasix 80 mg IV.  Patient became dyspneic on exertion in the ED with O2 sats dropping to 87% on 2 L of oxygen. No previous history of stroke or seizures. No nausea vomiting or diarrhea. Denies abdominal pain.    Review of systems:    In addition to the HPI above,    All other systems reviewed and are negative.   With Past History of the following :    Past Medical History:  Diagnosis Date  . Acute on chronic diastolic (congestive) heart failure ([place])   . Anxiety disorder   . Aortic stenosis    Status post St. Jude mechanical AVR 2007  . Asthma   . Atrial fibrillation ([place])   . Carcinoid tumor of colon 2007  . Coronary atherosclerosis of native coronary artery    Status post CABG 2007  . GERD (gastroesophageal reflux disease)   . History of colonoscopy 2003   Dr. Gala Romney - normal  . Hyperlipidemia   . Macromastia   . Pedal edema    Bilateral, chronic  . Peptic stricture of esophagus 10/04/2010   GE junction on last EGD by Dr. Gala Romney, benign biopsies  . RLS (restless legs syndrome)   . Schatzki's ring       Past Surgical History:  Procedure  Laterality Date  . ABDOMINAL HYSTERECTOMY    . AORTIC VALVE REPLACEMENT  2007   #25 mm St. 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 APPENDECTOMY  2007  . BACK SURGERY     lumbar 4 and 5   . BIOPSY  02/05/2012   RMR:Two tongues of salmon-colored epithelium distal esophagus, very short-segment Barrett's s/p bx/Small hiatal hernia, otherwise normal stomach, D1, D2. Status post esophageal dilation. Biopsy showed GERD.  . Breast cyst removed     bilateral  . BREAST REDUCTION SURGERY    . CESAREAN SECTION    . 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 COLONOSCOPY  02/05/2012   BMW:UXLKGM rectum, sigmoid diverticulosis,descending colon polyp , tubular adenoma  . CORONARY ARTERY BYPASS GRAFT     06/2006 - RIMA to RCA, SVG to RCA  . ESOPHAGOGASTRODUODENOSCOPY  10/03/2010   Dr. Gala Romney- schatzki's  ring, shoft peptic stricture at GE junction.  . ESOPHAGOGASTRODUODENOSCOPY (EGD) WITH PROPOFOL N/A 02/08/2018   Procedure: ESOPHAGOGASTRODUODENOSCOPY (EGD) WITH PROPOFOL;  Surgeon: Rourk, Robert M, MD;  Location: [facility];  Service: Endoscopy;  Laterality: N/A;  8:15am  . FOOT SURGERY     bilateral bunionectomy  . HERNIA REPAIR     with mesh  . LAPAROTOMY  2007   small bowel resection secondary to small bowel obstruction  . MALONEY DILATION  02/05/2012   Procedure: MALONEY DILATION;  Surgeon: Robert M Rourk, MD;  Location: [facility];  Service: Endoscopy;  Laterality: N/A;  56mm   . MALONEY DILATION N/A 02/08/2018   Procedure: MALONEY DILATION;  Surgeon: Rourk, Robert M, MD;  Location: [facility];  Service: Endoscopy;  Laterality: N/A;  . Teeth removal        Social History:      Social History   Tobacco Use  . Smoking status: Former Smoker    Packs/day: 1.00    Years: 20.00    Pack years: 20.00    Types: Cigarettes    Last attempt to quit: 10/20/1988    Years since quitting: 29.8  . Smokeless tobacco: Never Used  Substance  Use Topics  . Alcohol use: No    Alcohol/week: 0.0 standard drinks       Family History :     Family History  Problem Relation Age of Onset  . Stroke Mother   . Cancer Father   . Anesthesia problems Neg Hx   . Hypotension Neg Hx   . Malignant hyperthermia Neg Hx   . Pseudochol deficiency Neg Hx       Home Medications:   Prior to Admission medications   Medication Sig Start Date End Date Taking? Authorizing Provider  albuterol (PROVENTIL HFA;VENTOLIN HFA) 108 (90 BASE) MCG/ACT inhaler Inhale 1-2 puffs into the lungs every 6 (six) hours as needed for wheezing or shortness of breath.    Yes [provider]  allopurinol (ZYLOPRIM) 100 MG tablet Take 100 mg by mouth daily.   Yes [provider]  ALPRAZolam (XANAX) 0.25 MG tablet Take 1 tablet (0.25 mg total) by mouth 2 (two) times daily as needed for anxiety. Patient taking differently: Take 1 mg by mouth 2 (two) times daily as needed for anxiety.  05/05/18  Yes Johnson, Clanford L, MD  bisoprolol (ZEBETA) 5 MG tablet Take 1 tablet (5 mg total) by mouth daily. 08/03/18  Yes Tat, David, MD  budesonide-formoterol (SYMBICORT) 160-4.5 MCG/ACT inhaler Inhale 2 puffs into the lungs 2 (two) times daily. 03/31/18  Yes Madera, Carlos, MD  calcium carbonate (OS-CAL) 600 MG TABS tablet Take 600 mg by mouth 2 (two) times daily.   Yes [provider]  cetirizine (ZYRTEC) 10 MG tablet Take 10 mg by mouth daily.     Yes [provider]  CRESTOR 10 MG tablet Take 10 mg by mouth at bedtime.  12/02/13  Yes [provider]  cyclobenzaprine (FLEXERIL) 10 MG tablet Take 10 mg by mouth daily as needed for muscle spasms.   Yes [provider]  diltiazem (CARDIZEM CD) 120 MG 24 hr capsule Take 1 capsule (120 mg total) by mouth daily. 04/01/18  Yes Madera, Carlos, MD  furosemide (LASIX) 80 MG tablet Take 1 tablet (80 mg total) by mouth 2 (two) times daily. 08/02/18  Yes Tat, David, MD  HYDROcodone-acetaminophen  (NORCO) 10-325 MG tablet Take 1 tablet by mouth daily as needed for moderate pain. Normally takes   one tablet at bedtime for back pain   Yes [provider]  levothyroxine (SYNTHROID, LEVOTHROID) 50 MCG tablet Take 50 mcg by mouth daily before breakfast.    Yes [provider]  magnesium oxide (MAG-OX) 400 MG tablet Take 400 mg by mouth daily.   Yes [provider]  Multiple Vitamin (MULTIVITAMIN WITH MINERALS) TABS tablet Take 1 tablet by mouth daily.   Yes [provider]  omeprazole (PRILOSEC) 20 MG capsule Take 1 capsule (20 mg total) by mouth 2 (two) times daily before a meal. 07/22/18  Yes Mahala Menghini, PA-C  OXYGEN Inhale 2 L into the lungs daily.    Yes [provider]  potassium chloride (KLOR-CON) 20 MEQ packet Take 20 mEq by mouth 2 (two) times daily.    Yes [provider]  rOPINIRole (REQUIP) 3 MG tablet Take 3 mg by mouth 2 (two) times daily as needed (for Restless leg syndrome).    Yes [provider]  temazepam (RESTORIL) 30 MG capsule Take 30 mg by mouth at bedtime as needed for sleep.  06/24/18  Yes [provider]  warfarin (COUMADIN) 4 MG tablet Take 4-6 mg by mouth See admin instructions. 67m on MWF and take 445mon all other days   Yes [provider]  predniSONE (DELTASONE) 10 MG tablet Take 6 tablets (60 mg total) by mouth daily with breakfast. And decrease by one tablet daily Patient not taking: Reported on 09/01/2018 08/03/18   TaOrson EvaMD  warfarin (COUMADIN) 2 MG tablet Takes one tablet (62m77meveryday Patient not taking: Reported on 09/01/2018 05/08/18   JohMurlean IbaD     Allergies:     Allergies  Allergen Reactions  . Penicillins Hives, Itching, Swelling and Rash    Has patient had a PCN reaction causing immediate rash, facial/tongue/throat swelling, SOB or lightheadedness with hypotension: Yes Has patient had a PCN reaction causing severe rash involving mucus membranes or  skin necrosis: Yes Has patient had a PCN reaction that required hospitalization: unknown Has patient had a PCN reaction occurring within the last 10 years: No If all of the above answers are "NO", then may proceed with Cephalosporin use.    Throat swelling     Physical Exam:   Vitals  Blood pressure 106/78, pulse 82, temperature 98.2 F (36.8 C), temperature source Oral, resp. rate (!) 23, height 5' 8" (1.727 m), weight 87.1 kg, SpO2 94 %.  1.  General: Appears in no acute distress  2. Psychiatric:  Intact judgement and  insight, awake alert, oriented x 3.  3. Neurologic: No focal neurological deficits, all cranial nerves intact.Strength 5/5 all 4 extremities, sensation intact all 4 extremities, plantars down going.  4. Eyes :  anicteric sclerae, moist conjunctivae with no lid lag. PERRLA.  5. ENMT:  Oropharynx clear with moist mucous membranes and good dentition  6. Neck:  supple, no cervical lymphadenopathy appriciated, No thyromegaly  7. Respiratory : Normal respiratory effort, bilateral wheezing  8. Cardiovascular : RRR, no gallops, rubs or murmurs, no leg edema  9. Gastrointestinal:  Positive bowel sounds, abdomen soft, non-tender to palpation,no hepatosplenomegaly, no rigidity or guarding       10. Skin:  No cyanosis, normal texture and turgor, no rash, lesions or ulcers  11.Musculoskeletal:  Good muscle tone,  joints appear normal , no effusions,  normal range of motion    Data Review:    CBC Recent Labs  Lab 09/01/18 1722  WBC 10.0  HGB 9.5*  HCT 33.9*  PLT 332  MCV 90.6  MCH 25.4*  MCHC 28.0*  RDW 20.3*  LYMPHSABS 1.2  MONOABS 1.1*  EOSABS 0.0  BASOSABS 0.0   ------------------------------------------------------------------------------------------------------------------  Results for orders placed or performed during the hospital encounter of 09/01/18 (from the past 48 hour(s))  Brain natriuretic peptide     Status: Abnormal    Collection Time: 09/01/18  5:22 PM  Result Value Ref Range   B Natriuretic Peptide 958.0 (H) 0.0 - 100.0 pg/mL    Comment: Performed at [facility], [address], [place], [place] [address]  CBC with Differential/Platelet     Status: Abnormal   Collection Time: 09/01/18  5:22 PM  Result Value Ref Range   WBC 10.0 4.0 - 10.5 K/uL   RBC 3.74 (L) 3.87 - 5.11 MIL/uL   Hemoglobin 9.5 (L) 12.0 - 15.0 g/dL   HCT 33.9 (L) 36.0 - 46.0 %   MCV 90.6 80.0 - 100.0 fL   MCH 25.4 (L) 26.0 - 34.0 pg   MCHC 28.0 (L) 30.0 - 36.0 g/dL   RDW 20.3 (H) 11.5 - 15.5 %   Platelets 332 150 - 400 K/uL   nRBC 0.0 0.0 - 0.2 %   Neutrophils Relative % 76 %   Neutro Abs 7.5 1.7 - 7.7 K/uL   Lymphocytes Relative 12 %   Lymphs Abs 1.2 0.7 - 4.0 K/uL   Monocytes Relative 11 %   Monocytes Absolute 1.1 (H) 0.1 - 1.0 K/uL   Eosinophils Relative 0 %   Eosinophils Absolute 0.0 0.0 - 0.5 K/uL   Basophils Relative 0 %   Basophils Absolute 0.0 0.0 - 0.1 K/uL   Immature Granulocytes 1 %   Abs Immature Granulocytes 0.05 0.00 - 0.07 K/uL    Comment: Performed at [facility], [address], [place], [place] [address]  Basic metabolic panel     Status: Abnormal   Collection Time: 09/01/18  5:22 PM  Result Value Ref Range   Sodium 139 135 - 145 mmol/L   Potassium 3.2 (L) 3.5 - 5.1 mmol/L   Chloride 98 98 - 111 mmol/L   CO2 32 22 - 32 mmol/L   Glucose, Bld 100 (H) 70 - 99 mg/dL   BUN 12 8 - 23 mg/dL   Creatinine, Ser 1.11 (H) 0.44 - 1.00 mg/dL   Calcium 8.6 (L) 8.9 - 10.3 mg/dL   GFR calc non Af Amer 51 (L) >60 mL/min   GFR calc Af Amer 59 (L) >60 mL/min    Comment: (NOTE) The eGFR has been calculated using the CKD EPI equation. This calculation has not been validated in all clinical situations. eGFR's persistently <60 mL/min signify possible Chronic Kidney Disease.    Anion gap 9 5 - 15    Comment: Performed at [facility], [address] [place], [place] [address]  Protime-INR     Status: Abnormal    Collection Time: 09/01/18  5:22 PM  Result Value Ref Range   Prothrombin Time 29.1 (H) 11.4 - 15.2 seconds   INR 2.80     Comment: Performed at [facility], [address], [place], [place] [address]    Chemistries  Recent Labs  Lab 09/01/18 1722  NA 139  K 3.2*  CL 98  CO2 32  GLUCOSE 100*  BUN 12  CREATININE 1.11*  CALCIUM 8.6*   ------------------------------------------------------------------------------------------------------------------  ------------------------------------------------------------------------------------------------------------------ GFR: Estimated Creatinine Clearance: 57.6 mL/min (A) (by C-G formula based on SCr of 1.11 mg/dL (  H)). Liver Function Tests: No results for input(s): AST, ALT, ALKPHOS, BILITOT, PROT, ALBUMIN in the last 168 hours. No results for input(s): LIPASE, AMYLASE in the last 168 hours. No results for input(s): AMMONIA in the last 168 hours. Coagulation Profile: Recent Labs  Lab 09/01/18 1722  INR 2.80   Cardiac Enzymes: No results for input(s): CKTOTAL, CKMB, CKMBINDEX, TROPONINI in the last 168 hours. BNP (last 3 results) No results for input(s): PROBNP in the last 8760 hours. HbA1C: No results for input(s): HGBA1C in the last 72 hours. CBG: No results for input(s): GLUCAP in the last 168 hours. Lipid Profile: No results for input(s): CHOL, HDL, LDLCALC, TRIG, CHOLHDL, LDLDIRECT in the last 72 hours. Thyroid Function Tests: No results for input(s): TSH, T4TOTAL, FREET4, T3FREE, THYROIDAB in the last 72 hours. Anemia Panel: No results for input(s): VITAMINB12, FOLATE, FERRITIN, TIBC, IRON, RETICCTPCT in the last 72 hours.  --------------------------------------------------------------------------------------------------------------- Urine analysis: No results found for: COLORURINE, APPEARANCEUR, LABSPEC, PHURINE, GLUCOSEU, HGBUR, BILIRUBINUR, KETONESUR, PROTEINUR, UROBILINOGEN, NITRITE, LEUKOCYTESUR    Imaging  Results:    Dg Chest Port 1 View  Result Date: 09/01/2018 CLINICAL DATA:  Lower extremity and periorbital edema. EXAM: PORTABLE CHEST 1 VIEW COMPARISON:  07/26/2018 FINDINGS: Unchanged moderate cardiomegaly with findings of prior median sternotomy and valve replacement. There is mild interstitial pulmonary edema. Small right pleural effusion with associated atelectasis. IMPRESSION: Cardiomegaly with mild interstitial pulmonary edema and small right pleural effusion. Electronically Signed   By: Ulyses Jarred M.D.   On: 09/01/2018 17:47    My personal review of EKG: Rhythm atrial fibrillation   Assessment & Plan:    Active Problems:   CHF exacerbation ([place])   1. CHF exacerbation-acute on chronic diastolic CHF, will start patient on Lasix 40 mg IV every 12 hours.  Strict intake and output.  Check BMP in a.m.  2. Atrial fibrillation-heart rate is controlled, continue Cardizem, bisoprolol.  Patient takes warfarin.  Will consult pharmacy for dosing warfarin.  3. Hypokalemia-potassium is 3.2, will replace potassium and check BMP in a.m.  4. COPD exacerbation-mild, patient does have wheezing bilaterally start DuoNeb nebulizer every 6 hours.  Also start Solu-Medrol 40 mg every 6 hours.  5. Hypothyroidism-continue Synthroid   DVT Prophylaxis-   warfarin  AM Labs Ordered, also please review Full Orders  Family Communication: Admission, patients condition and plan of care including tests being ordered have been discussed with the patient  who indicate understanding and agree with the plan and Code Status.  Code Status: Full code  Admission status: Observation  Time spent in minutes : 60 minutes   Oswald Hillock M.D on 09/01/2018 at 11:32 PM  Between 7am to 7pm - Pager - [phone]. After 7pm go to [email or web address] - password [facility]  [facility] - Office  [phone]

## 2018-09-02 DIAGNOSIS — Z951 Presence of aortocoronary bypass graft: Secondary | ICD-10-CM | POA: Diagnosis not present

## 2018-09-02 DIAGNOSIS — J441 Chronic obstructive pulmonary disease with (acute) exacerbation: Secondary | ICD-10-CM | POA: Diagnosis not present

## 2018-09-02 DIAGNOSIS — M109 Gout, unspecified: Secondary | ICD-10-CM | POA: Diagnosis present

## 2018-09-02 DIAGNOSIS — I10 Essential (primary) hypertension: Secondary | ICD-10-CM | POA: Diagnosis not present

## 2018-09-02 DIAGNOSIS — I5023 Acute on chronic systolic (congestive) heart failure: Secondary | ICD-10-CM | POA: Diagnosis not present

## 2018-09-02 DIAGNOSIS — I482 Chronic atrial fibrillation, unspecified: Secondary | ICD-10-CM | POA: Diagnosis not present

## 2018-09-02 DIAGNOSIS — E039 Hypothyroidism, unspecified: Secondary | ICD-10-CM

## 2018-09-02 DIAGNOSIS — J9621 Acute and chronic respiratory failure with hypoxia: Secondary | ICD-10-CM

## 2018-09-02 DIAGNOSIS — Z79899 Other long term (current) drug therapy: Secondary | ICD-10-CM | POA: Diagnosis not present

## 2018-09-02 DIAGNOSIS — E785 Hyperlipidemia, unspecified: Secondary | ICD-10-CM | POA: Diagnosis not present

## 2018-09-02 DIAGNOSIS — I2510 Atherosclerotic heart disease of native coronary artery without angina pectoris: Secondary | ICD-10-CM | POA: Diagnosis not present

## 2018-09-02 DIAGNOSIS — Z880 Allergy status to penicillin: Secondary | ICD-10-CM | POA: Diagnosis not present

## 2018-09-02 DIAGNOSIS — F419 Anxiety disorder, unspecified: Secondary | ICD-10-CM | POA: Diagnosis present

## 2018-09-02 DIAGNOSIS — K219 Gastro-esophageal reflux disease without esophagitis: Secondary | ICD-10-CM

## 2018-09-02 DIAGNOSIS — I5033 Acute on chronic diastolic (congestive) heart failure: Secondary | ICD-10-CM | POA: Diagnosis not present

## 2018-09-02 DIAGNOSIS — I130 Hypertensive heart and chronic kidney disease with heart failure and stage 1 through stage 4 chronic kidney disease, or unspecified chronic kidney disease: Secondary | ICD-10-CM | POA: Diagnosis not present

## 2018-09-02 DIAGNOSIS — Z7901 Long term (current) use of anticoagulants: Secondary | ICD-10-CM | POA: Diagnosis not present

## 2018-09-02 DIAGNOSIS — Z9981 Dependence on supplemental oxygen: Secondary | ICD-10-CM | POA: Diagnosis not present

## 2018-09-02 DIAGNOSIS — G2581 Restless legs syndrome: Secondary | ICD-10-CM | POA: Diagnosis present

## 2018-09-02 DIAGNOSIS — E876 Hypokalemia: Secondary | ICD-10-CM | POA: Diagnosis present

## 2018-09-02 DIAGNOSIS — I5043 Acute on chronic combined systolic (congestive) and diastolic (congestive) heart failure: Secondary | ICD-10-CM | POA: Diagnosis not present

## 2018-09-02 DIAGNOSIS — Z7951 Long term (current) use of inhaled steroids: Secondary | ICD-10-CM | POA: Diagnosis not present

## 2018-09-02 DIAGNOSIS — Z952 Presence of prosthetic heart valve: Secondary | ICD-10-CM | POA: Diagnosis not present

## 2018-09-02 DIAGNOSIS — Z87891 Personal history of nicotine dependence: Secondary | ICD-10-CM | POA: Diagnosis not present

## 2018-09-02 DIAGNOSIS — N183 Chronic kidney disease, stage 3 (moderate): Secondary | ICD-10-CM | POA: Diagnosis not present

## 2018-09-02 LAB — CBC
HEMATOCRIT: 31.6 % — AB (ref 36.0–46.0)
HEMOGLOBIN: 8.7 g/dL — AB (ref 12.0–15.0)
MCH: 24.8 pg — AB (ref 26.0–34.0)
MCHC: 27.5 g/dL — AB (ref 30.0–36.0)
MCV: 90 fL (ref 80.0–100.0)
Platelets: 277 10*3/uL (ref 150–400)
RBC: 3.51 MIL/uL — ABNORMAL LOW (ref 3.87–5.11)
RDW: 20.3 % — ABNORMAL HIGH (ref 11.5–15.5)
WBC: 7.8 10*3/uL (ref 4.0–10.5)
nRBC: 0 % (ref 0.0–0.2)

## 2018-09-02 LAB — COMPREHENSIVE METABOLIC PANEL
ALK PHOS: 91 U/L (ref 38–126)
ALT: 13 U/L (ref 0–44)
ANION GAP: 8 (ref 5–15)
AST: 23 U/L (ref 15–41)
Albumin: 2.9 g/dL — ABNORMAL LOW (ref 3.5–5.0)
BILIRUBIN TOTAL: 0.9 mg/dL (ref 0.3–1.2)
BUN: 11 mg/dL (ref 8–23)
CO2: 35 mmol/L — ABNORMAL HIGH (ref 22–32)
CREATININE: 1.03 mg/dL — AB (ref 0.44–1.00)
Calcium: 8.7 mg/dL — ABNORMAL LOW (ref 8.9–10.3)
Chloride: 98 mmol/L (ref 98–111)
GFR, EST NON AFRICAN AMERICAN: 55 mL/min — AB (ref >60)
Glucose, Bld: 79 mg/dL (ref 70–99)
Potassium: 3.8 mmol/L (ref 3.5–5.1)
SODIUM: 141 mmol/L (ref 135–145)
TOTAL PROTEIN: 7 g/dL (ref 6.5–8.1)

## 2018-09-02 LAB — GLUCOSE, CAPILLARY: GLUCOSE-CAPILLARY: 69 mg/dL — AB (ref 70–99)

## 2018-09-02 MED ORDER — POTASSIUM CHLORIDE 20 MEQ PO PACK
20.0000 meq | PACK | Freq: Two times a day (BID) | ORAL | Status: DC
  Administered 2018-09-02 – 2018-09-05 (×5): 20 meq via ORAL
  Filled 2018-09-02 (×7): qty 1

## 2018-09-02 MED ORDER — FUROSEMIDE 10 MG/ML IJ SOLN
40.0000 mg | Freq: Three times a day (TID) | INTRAMUSCULAR | Status: DC
  Administered 2018-09-02 – 2018-09-05 (×9): 40 mg via INTRAVENOUS
  Filled 2018-09-02 (×9): qty 4

## 2018-09-02 MED ORDER — WARFARIN SODIUM 2 MG PO TABS
3.0000 mg | ORAL_TABLET | Freq: Once | ORAL | Status: AC
  Administered 2018-09-02: 3 mg via ORAL
  Filled 2018-09-02: qty 1

## 2018-09-02 MED ORDER — SODIUM CHLORIDE 0.9% FLUSH: 3.0000 mL | INTRAVENOUS | Status: DC | PRN

## 2018-09-02 MED ORDER — ALBUTEROL SULFATE HFA 108 (90 BASE) MCG/ACT IN AERS: 1.0000 | INHALATION_SPRAY | Freq: Four times a day (QID) | RESPIRATORY_TRACT | Status: DC | PRN

## 2018-09-02 MED ORDER — MAGNESIUM OXIDE 400 (241.3 MG) MG PO TABS
400.0000 mg | ORAL_TABLET | Freq: Every day | ORAL | Status: DC
  Administered 2018-09-02 – 2018-09-05 (×4): 400 mg via ORAL
  Filled 2018-09-02 (×4): qty 1

## 2018-09-02 MED ORDER — HYDROCODONE-ACETAMINOPHEN 10-325 MG PO TABS: 1.0000 | ORAL_TABLET | Freq: Every day | ORAL | Status: DC | PRN

## 2018-09-02 MED ORDER — ROSUVASTATIN CALCIUM 10 MG PO TABS
10.0000 mg | ORAL_TABLET | Freq: Every day | ORAL | Status: DC
  Administered 2018-09-02 – 2018-09-04 (×3): 10 mg via ORAL
  Filled 2018-09-02 (×3): qty 1

## 2018-09-02 MED ORDER — DILTIAZEM HCL ER COATED BEADS 120 MG PO CP24
120.0000 mg | ORAL_CAPSULE | Freq: Every day | ORAL | Status: DC
  Administered 2018-09-02 – 2018-09-05 (×4): 120 mg via ORAL
  Filled 2018-09-02 (×4): qty 1

## 2018-09-02 MED ORDER — BISOPROLOL FUMARATE 5 MG PO TABS
5.0000 mg | ORAL_TABLET | Freq: Every day | ORAL | Status: DC
  Administered 2018-09-02 – 2018-09-05 (×4): 5 mg via ORAL
  Filled 2018-09-02 (×4): qty 1

## 2018-09-02 MED ORDER — SODIUM CHLORIDE 0.9% FLUSH
3.0000 mL | Freq: Two times a day (BID) | INTRAVENOUS | Status: DC
  Administered 2018-09-02 – 2018-09-04 (×6): 3 mL via INTRAVENOUS

## 2018-09-02 MED ORDER — LEVOTHYROXINE SODIUM 50 MCG PO TABS
50.0000 ug | ORAL_TABLET | Freq: Every day | ORAL | Status: DC
  Administered 2018-09-02 – 2018-09-05 (×4): 50 ug via ORAL
  Filled 2018-09-02 (×4): qty 1

## 2018-09-02 MED ORDER — IPRATROPIUM-ALBUTEROL 0.5-2.5 (3) MG/3ML IN SOLN
RESPIRATORY_TRACT | Status: AC
  Filled 2018-09-02: qty 3

## 2018-09-02 MED ORDER — ONDANSETRON HCL 4 MG PO TABS
4.0000 mg | ORAL_TABLET | Freq: Four times a day (QID) | ORAL | Status: DC | PRN
  Administered 2018-09-05: 4 mg via ORAL
  Filled 2018-09-02: qty 1

## 2018-09-02 MED ORDER — ONDANSETRON HCL 4 MG/2ML IJ SOLN: 4.0000 mg | Freq: Four times a day (QID) | INTRAMUSCULAR | Status: DC | PRN

## 2018-09-02 MED ORDER — WARFARIN - PHARMACIST DOSING INPATIENT
Freq: Every day | Status: DC
  Administered 2018-09-02 – 2018-09-04 (×3)

## 2018-09-02 MED ORDER — ROPINIROLE HCL 1 MG PO TABS: 3.0000 mg | ORAL_TABLET | Freq: Two times a day (BID) | ORAL | Status: DC | PRN

## 2018-09-02 MED ORDER — SODIUM CHLORIDE 0.9 % IV SOLN: 250.0000 mL | INTRAVENOUS | Status: DC | PRN

## 2018-09-02 MED ORDER — MOMETASONE FURO-FORMOTEROL FUM 200-5 MCG/ACT IN AERO
2.0000 | INHALATION_SPRAY | Freq: Two times a day (BID) | RESPIRATORY_TRACT | Status: DC
  Administered 2018-09-02: 2 via RESPIRATORY_TRACT
  Filled 2018-09-02: qty 8.8

## 2018-09-02 MED ORDER — ALLOPURINOL 100 MG PO TABS
100.0000 mg | ORAL_TABLET | Freq: Every day | ORAL | Status: DC
  Administered 2018-09-02 – 2018-09-05 (×4): 100 mg via ORAL
  Filled 2018-09-02 (×4): qty 1

## 2018-09-02 MED ORDER — BUDESONIDE 0.5 MG/2ML IN SUSP
0.5000 mg | Freq: Two times a day (BID) | RESPIRATORY_TRACT | Status: DC
  Administered 2018-09-02 – 2018-09-05 (×6): 0.5 mg via RESPIRATORY_TRACT
  Filled 2018-09-02 (×7): qty 2

## 2018-09-02 MED ORDER — FUROSEMIDE 10 MG/ML IJ SOLN
40.0000 mg | Freq: Two times a day (BID) | INTRAMUSCULAR | Status: DC
  Administered 2018-09-02: 40 mg via INTRAVENOUS
  Filled 2018-09-02: qty 4

## 2018-09-02 MED ORDER — ARFORMOTEROL TARTRATE 15 MCG/2ML IN NEBU
15.0000 ug | INHALATION_SOLUTION | Freq: Two times a day (BID) | RESPIRATORY_TRACT | Status: DC
  Administered 2018-09-02 – 2018-09-05 (×6): 15 ug via RESPIRATORY_TRACT
  Filled 2018-09-02 (×6): qty 2

## 2018-09-02 MED ORDER — METHYLPREDNISOLONE SODIUM SUCC 40 MG IJ SOLR
40.0000 mg | Freq: Four times a day (QID) | INTRAMUSCULAR | Status: DC
  Administered 2018-09-02 – 2018-09-04 (×10): 40 mg via INTRAVENOUS
  Filled 2018-09-02 (×11): qty 1

## 2018-09-02 MED ORDER — POTASSIUM CHLORIDE CRYS ER 20 MEQ PO TBCR
40.0000 meq | EXTENDED_RELEASE_TABLET | Freq: Once | ORAL | Status: DC
  Filled 2018-09-02 (×2): qty 2

## 2018-09-02 MED ORDER — IPRATROPIUM-ALBUTEROL 0.5-2.5 (3) MG/3ML IN SOLN
3.0000 mL | Freq: Four times a day (QID) | RESPIRATORY_TRACT | Status: DC
  Administered 2018-09-02 – 2018-09-05 (×13): 3 mL via RESPIRATORY_TRACT
  Filled 2018-09-02 (×13): qty 3

## 2018-09-02 MED ORDER — ALPRAZOLAM 0.25 MG PO TABS
0.2500 mg | ORAL_TABLET | Freq: Two times a day (BID) | ORAL | Status: DC | PRN
  Administered 2018-09-02 – 2018-09-03 (×3): 0.25 mg via ORAL
  Filled 2018-09-02 (×3): qty 1

## 2018-09-02 NOTE — Progress Notes (Signed)
ANTICOAGULATION CONSULT NOTE - Initial Consult  Pharmacy Consult for warfarin Indication: mechanical valve  Allergies  Allergen Reactions  . Penicillins Hives, Itching, Swelling and Rash    Has patient had a PCN reaction causing immediate rash, facial/tongue/throat swelling, SOB or lightheadedness with hypotension: Yes Has patient had a PCN reaction causing severe rash involving mucus membranes or skin necrosis: Yes Has patient had a PCN reaction that required hospitalization: unknown Has patient had a PCN reaction occurring within the last 10 years: No If all of the above answers are "NO", then may proceed with Cephalosporin use.    Throat swelling    Patient Measurements: Height: 5\' 8"  (172.7 cm) Weight: 192 lb 7.4 oz (87.3 kg) IBW/kg (Calculated) : 63.9   Vital Signs: Temp: 98.5 F (36.9 C) (11/14 0045) Temp Source: Oral (11/14 0045) BP: 112/64 (11/14 0045) Pulse Rate: 79 (11/14 0045)  Labs: Recent Labs    09/01/18 1722 09/02/18 0617  HGB 9.5* 8.7*  HCT 33.9* 31.6*  PLT 332 277  LABPROT 29.1*  --   INR 2.80  --   CREATININE 1.11* 1.03*    Estimated Creatinine Clearance: 62.2 mL/min (A) (by C-G formula based on SCr of 1.03 mg/dL (H)).   Medical History: Past Medical History:  Diagnosis Date  . Acute on chronic diastolic (congestive) heart failure ([place])   . Anxiety disorder   . Aortic stenosis    Status post St. Jude mechanical AVR 2007  . Asthma   . Atrial fibrillation ([place])   . Carcinoid tumor of colon 2007  . Coronary atherosclerosis of native coronary artery    Status post CABG 2007  . GERD (gastroesophageal reflux disease)   . History of colonoscopy 2003   Dr. Gala Romney - normal  . Hyperlipidemia   . Macromastia   . Pedal edema    Bilateral, chronic  . Peptic stricture of esophagus 10/04/2010   GE junction on last EGD by Dr. Gala Romney, benign biopsies  . RLS (restless legs syndrome)   . Schatzki's ring     Medications:  Medications Prior to  Admission  Medication Sig Dispense Refill Last Dose  . albuterol (PROVENTIL HFA;VENTOLIN HFA) 108 (90 BASE) MCG/ACT inhaler Inhale 1-2 puffs into the lungs every 6 (six) hours as needed for wheezing or shortness of breath.    09/01/2018 at Unknown time  . allopurinol (ZYLOPRIM) 100 MG tablet Take 100 mg by mouth daily.   09/01/2018 at Unknown time  . ALPRAZolam (XANAX) 0.25 MG tablet Take 1 tablet (0.25 mg total) by mouth 2 (two) times daily as needed for anxiety. (Patient taking differently: Take 1 mg by mouth 2 (two) times daily as needed for anxiety. ) 10 tablet 0 09/01/2018 at Unknown time  . bisoprolol (ZEBETA) 5 MG tablet Take 1 tablet (5 mg total) by mouth daily. 30 tablet 1 09/01/2018 at 800a  . budesonide-formoterol (SYMBICORT) 160-4.5 MCG/ACT inhaler Inhale 2 puffs into the lungs 2 (two) times daily. 1 Inhaler 3 09/01/2018 at Unknown time  . calcium carbonate (OS-CAL) 600 MG TABS tablet Take 600 mg by mouth 2 (two) times daily.   09/01/2018 at Unknown time  . cetirizine (ZYRTEC) 10 MG tablet Take 10 mg by mouth daily.     09/01/2018 at Unknown time  . CRESTOR 10 MG tablet Take 10 mg by mouth at bedtime.    08/31/2018 at Unknown time  . cyclobenzaprine (FLEXERIL) 10 MG tablet Take 10 mg by mouth daily as needed for muscle spasms.   unknown  .  diltiazem (CARDIZEM CD) 120 MG 24 hr capsule Take 1 capsule (120 mg total) by mouth daily. 30 capsule 2 09/01/2018 at Unknown time  . furosemide (LASIX) 80 MG tablet Take 1 tablet (80 mg total) by mouth 2 (two) times daily. 60 tablet 1 09/01/2018 at Unknown time  . HYDROcodone-acetaminophen (NORCO) 10-325 MG tablet Take 1 tablet by mouth daily as needed for moderate pain. Normally takes one tablet at bedtime for back pain   Past Week at Unknown time  . levothyroxine (SYNTHROID, LEVOTHROID) 50 MCG tablet Take 50 mcg by mouth daily before breakfast.    09/01/2018 at Unknown time  . magnesium oxide (MAG-OX) 400 MG tablet Take 400 mg by mouth daily.    09/01/2018 at Unknown time  . Multiple Vitamin (MULTIVITAMIN WITH MINERALS) TABS tablet Take 1 tablet by mouth daily.   09/01/2018 at Unknown time  . omeprazole (PRILOSEC) 20 MG capsule Take 1 capsule (20 mg total) by mouth 2 (two) times daily before a meal. 60 capsule 5 09/01/2018 at Unknown time  . OXYGEN Inhale 2 L into the lungs daily.    09/01/2018 at Unknown time  . potassium chloride (KLOR-CON) 20 MEQ packet Take 20 mEq by mouth 2 (two) times daily.    09/01/2018 at Unknown time  . rOPINIRole (REQUIP) 3 MG tablet Take 3 mg by mouth 2 (two) times daily as needed (for Restless leg syndrome).    unknown at Unknown time  . temazepam (RESTORIL) 30 MG capsule Take 30 mg by mouth at bedtime as needed for sleep.    unknown  . warfarin (COUMADIN) 4 MG tablet Take 4-6 mg by mouth See admin instructions. 6mg  on MWF and take 4mg  on all other days   08/31/2018 at 1800  . predniSONE (DELTASONE) 10 MG tablet Take 6 tablets (60 mg total) by mouth daily with breakfast. And decrease by one tablet daily (Patient not taking: Reported on 09/01/2018) 21 tablet 0 Not Taking at Unknown time  . warfarin (COUMADIN) 2 MG tablet Takes one tablet (2mg ) everyday (Patient not taking: Reported on 09/01/2018) 45 tablet 3 Not Taking at Unknown time    Assessment: Pharmacy consulted to warfarin for mechanical AVR 2007 and atrial fibrillation. Last dose of warfarin on 11/12.   Latest anticoag visit note states patient is to take 4 mg on Tuesday and 3 mg ROW.  INR on admission is 2.80.  Goal of Therapy:  INR 2-3 Monitor platelets by anticoagulation protocol: Yes   Plan:  Warfarin 3 mg x 1 dose Monitor daily INR and s/s of bleeding  Remo Lipps C Syvilla Martin 09/02/2018,11:15 AM

## 2018-09-02 NOTE — Progress Notes (Signed)
Pt noted to be tachypenic all shift today. Dr. Dyann Kief had already rounded on patient. RT in to see patient this afternoon. O2 saturations in the mid 80's on 5 L [place]. RT placed on HFNC at 10 LPM. Dr. Dyann Kief paged and made aware. Awaiting new orders at this time. Will continue to monitor.

## 2018-09-02 NOTE — Care Management Obs Status (Signed)
[place] NOTIFICATION   Patient Details  Name: YANELY MAST MRN: [id number] Date of Birth: 1951-01-19   Medicare Observation Status Notification Given:  Yes    Kiandria Clum, Chauncey [place], RN 09/02/2018, 11:56 AM

## 2018-09-02 NOTE — Progress Notes (Signed)
PROGRESS NOTE    [name]  PPI:[id number] DOB: September 23, 1951 DOA: 09/01/2018 PCP: Octavio Graves, DO     Brief Narrative:  67 y/o female with a history of chronic respiratory failure currently on 2L home oxygen, COPD, chronic diastolic CHF, aortic stenosis status post mechanical AVR and atrial fibrillation (chronically on Coumadin, CAD status post CABG 2007, CKD stage III who came to the hospital complaining of shortness of breath. Patient states that she has fluid overload and felt that she was having trouble breathing and decided to come to the hospital.   Assessment & Plan: Acute on chronic respiratory failure secondary to COPD and CHF exacerbation (uses 2 L nasal cannula supplementation at baseline). -Chest X ray showing cardiomegaly with mild interstitial pulmonary edema and small right pleural effusion.  -BNP at 958 -continue IV diuretics (Lasix 40 mg TID now) -strict intake/output (-850 mL since admission) -follow daily weights -Importance of low sodium diet has been discussed with patient and husband at bedside. -recent Echo (07/27/18) demonstrated preserved ejection fraction, with no regional wall motion abnormalities. Patient has a St. Jude mechanical prothesis in aortic position. Will not repeat echo  -for her COPD component will Continue steroids, Continue Duoneb -Start Pulmicort and Brovana -start on Flutter valve -Continue oxygen supplementation and wean down as tolerated.  Atrial fibrillation/status post mechanical AVR -Heart rate is controlled -Continue Cardizem and Bisoprolol -Continue Coumadin (dose per pharmacy); goal 2.5-3.5,  Current INR at 2.3; if needed will bridge with heparin.  Hypokalemia -K on admission was 3.2 -Repleted, K within normal limits now -Will monitor electrolytes trend.  Hyperlipidemia -Continue statins  Hypothyroidism  -Continue Synthroid  Hx of anxiety -Patient did not appear to be in an anxious state -Continue Alprazolam  Hx  of gout -No acute flare appreciated -Continue allopurinol  Hx of restless leg syndrome -Continue Ropirinole  DVT prophylaxis: Coumadin Code Status: Full code Family Communication: Husband at bedside. All questions and concerns were addressed. Disposition Plan: Still with difficulty breathing, continue diuretics, continue nebulizers and steroids. Patient not ready for discharge.   Consultants:   None  Procedures:   Chest X-ray  See results below  Antimicrobials:  Anti-infectives (From admission, onward)   None       Subjective: Patient having difficulty speaking in full sentences due to shortness of breath with positive signs of fluid overload, requiring HFNC of supplemental oxygen. No fevers, no chest pain.  Objective: Vitals:   09/02/18 0045 09/02/18 0335 09/02/18 0755 09/02/18 0910  BP: 112/64     Pulse: 79     Resp: 19     Temp: 98.5 F (36.9 C)     TempSrc: Oral     SpO2: 94% 96% 98% 91%  Weight:      Height:        Intake/Output Summary (Last 24 hours) at 09/02/2018 1203 Last data filed at 09/02/2018 1044 Gross per 24 hour  Intake 480 ml  Output 850 ml  Net -370 ml   Filed Weights   09/01/18 1704 09/02/18 0030  Weight: 87.1 kg 87.3 kg    Examination:  General exam: Alert, awake, oriented x 3. Mild respiratory distress, requiring high flow nasal cannula oxygen supplementation.   Respiratory system: Decreased air movement. Fine crackles at the bases and expiratory wheezing.   Cardiovascular system: Controlled rate. Mild systolic ejection murmur appreciated, no rubs. Positive metallic click.  Gastrointestinal system: Abdomen is nondistended, soft and nontender. No organomegaly or masses felt. Normal bowel sounds heard. Central nervous system:  Alert and oriented. No focal neurological deficits. Extremities: Bilateral 1+ nonpitting edema below the knees. Signs of bilateral chronic venous stasis dermatitis.  +pedal pulses Skin: No petechiae, no open  wounds; chronic changes from venous stasis as mentioned above.  Psychiatry: Judgement and insight appear normal. Mood & affect appropriate.   Data Reviewed: I have personally reviewed following labs and imaging studies  CBC: Recent Labs  Lab 09/01/18 1722 09/02/18 0617  WBC 10.0 7.8  NEUTROABS 7.5  --   HGB 9.5* 8.7*  HCT 33.9* 31.6*  MCV 90.6 90.0  PLT 332 017   Basic Metabolic Panel: Recent Labs  Lab 09/01/18 1722 09/02/18 0617  NA 139 141  K 3.2* 3.8  CL 98 98  CO2 32 35*  GLUCOSE 100* 79  BUN 12 11  CREATININE 1.11* 1.03*  CALCIUM 8.6* 8.7*   GFR: Estimated Creatinine Clearance: 62.2 mL/min (A) (by C-G formula based on SCr of 1.03 mg/dL (H)). Liver Function Tests: Recent Labs  Lab 09/02/18 0617  AST 23  ALT 13  ALKPHOS 91  BILITOT 0.9  PROT 7.0  ALBUMIN 2.9*   No results for input(s): LIPASE, AMYLASE in the last 168 hours. No results for input(s): AMMONIA in the last 168 hours. Coagulation Profile: Recent Labs  Lab 09/01/18 1722  INR 2.80   CBG: Recent Labs  Lab 09/02/18 0741  GLUCAP 69*    Radiology Studies: Dg Chest Port 1 View  Result Date: 09/01/2018 CLINICAL DATA:  Lower extremity and periorbital edema. EXAM: PORTABLE CHEST 1 VIEW COMPARISON:  07/26/2018 FINDINGS: Unchanged moderate cardiomegaly with findings of prior median sternotomy and valve replacement. There is mild interstitial pulmonary edema. Small right pleural effusion with associated atelectasis. IMPRESSION: Cardiomegaly with mild interstitial pulmonary edema and small right pleural effusion. Electronically Signed   By: Ulyses Jarred M.D.   On: 09/01/2018 17:47   Scheduled Meds: . allopurinol  100 mg Oral Daily  . bisoprolol  5 mg Oral Daily  . diltiazem  120 mg Oral Daily  . furosemide  40 mg Intravenous Q12H  . ipratropium-albuterol  3 mL Nebulization Q6H  . levothyroxine  50 mcg Oral Q0600  . magnesium oxide  400 mg Oral Daily  . methylPREDNISolone (SOLU-MEDROL)  injection  40 mg Intravenous Q6H  . mometasone-formoterol  2 puff Inhalation BID  . potassium chloride  20 mEq Oral BID  . potassium chloride  40 mEq Oral Once  . rosuvastatin  10 mg Oral QHS  . sodium chloride flush  3 mL Intravenous Q12H  . warfarin  3 mg Oral Once  . Warfarin - Pharmacist Dosing Inpatient   Does not apply q1800   Continuous Infusions: . sodium chloride       LOS: 0 days    Time spent: 35 minutes. Greater than 50% of this time was spent in direct contact with the patient, coordinating care and discussing relevant ongoing clinical issues, including extensive discussion regarding importance of low-sodium diet, need for IV diuresis and also acute treatment/management for her COPD exacerbation.  Patient having difficulty speaking in full sentences and requiring high flow nasal cannula oxygen supplementation making her know ready for outpatient management.  Discussed with nursing staff and her care was tailored to her needs. Will monitor closely and if needed transfer to stepdown.  Barton Dubois, MD [facility] Pager [phone]  If 7PM-7AM, please contact night-coverage [email or web address] Password [email or web address] 09/02/2018, 12:03 PM

## 2018-09-03 LAB — PROTIME-INR
INR: 4.28
Prothrombin Time: 40.4 seconds — ABNORMAL HIGH (ref 11.4–15.2)

## 2018-09-03 NOTE — Progress Notes (Signed)
Critical INR  4.28.  Chat messaged Dr. Dyann Kief.  Called Stephen in pharmacy and they will adjust coumadin.

## 2018-09-03 NOTE — Progress Notes (Signed)
PROGRESS NOTE    [name]  YCX:[id number] DOB: [birth year] DOA: 09/01/2018 PCP: Octavio Graves, DO     Brief Narrative:  67 y/o female with a history of chronic respiratory failure currently on 2L home oxygen, COPD, chronic diastolic CHF, aortic stenosis status post mechanical AVR and atrial fibrillation (chronically on Coumadin, CAD status post CABG 2007, CKD stage III who came to the hospital complaining of shortness of breath. Patient states that she has fluid overload and felt that she was having trouble breathing and decided to come to the hospital.   Assessment & Plan: Acute on chronic respiratory failure secondary to COPD and CHF exacerbation (uses 2 L nasal cannula supplementation at baseline). -Chest X ray showing cardiomegaly with mild interstitial pulmonary edema and small right pleural effusion -BNP at 958 -Continue IV diuretics (Lasix 40 mg TID) -Strict intake/output (-3.6 L since admission) -continue to follow daily weights  -Importance of low sodium diet has been discussed with patient and husband at bedside. -recent Echo (07/27/18) demonstrated preserved ejection fraction, with no regional wall motion abnormalities. Patient has a St. Jude mechanical prothesis in aortic position. Will not repeat echo  -For her COPD component will continue steroids and Duoneb -Continue Pulmicort and Brovana -Patient was advised and taught how to use Flutter valve -Continue oxygen supplementation and wean down as tolerated; patient currently using 6-7 L of supplemental oxygen via [place].  Atrial fibrillation/status post mechanical AVR -Heart rate is controlled -Continue Cardizem and Bisoprolol -Hold Coumadin today as per pharmacy; INR is now supra therapeutic at 4.28; goal 2.5-3.5  Hypokalemia -K on admission was 3.2 -Repleted, K within normal limits -Will monitor electrolytes trend and further replete as needed.  Hyperlipidemia -Continue statins  Hypothyroidism  -Continue  Synthroid   Hx of anxiety -Patient did not appear to be in an anxious state -Continue Alprazolam  Hx of gout -No acute flare appreciated -Continue allopurinol  Hx of restless leg syndrome -Continue Ropirinole  DVT prophylaxis: Coumadin Code Status: Full code Family Communication: Husband at bed side. All of their questions and concerns were addressed.  Disposition Plan: Patient has improved breathing since yesterday, but still needs further IV diuresis, IV steroids and been able to wean oxygen to baseline. Patient still not ready for discharge.   Consultants:   None  Procedures:   Chest X-ray  See results below  Antimicrobials:  Anti-infectives (From admission, onward)   None       Subjective: Patient able to speak in full sentences, but still having minor difficulty. Patient appears slightly short of breath but overall improved from yesterday. Patient has less signs of fluid overload secondary to increased urine output since admission. Still requiring 6-7 L [place] of supplemental oxygen. No fevers, no chest pain.  Objective: Vitals:   09/03/18 0548 09/03/18 0821 09/03/18 0826 09/03/18 0842  BP: 111/77     Pulse: 80     Resp:      Temp: 98 F (36.7 C)     TempSrc: Oral     SpO2: 96% 98% 98% 98%  Weight:      Height:        Intake/Output Summary (Last 24 hours) at 09/03/2018 1255 Last data filed at 09/03/2018 1100 Gross per 24 hour  Intake 480 ml  Output 1450 ml  Net -970 ml   Filed Weights   09/01/18 1704 09/02/18 0030  Weight: 87.1 kg 87.3 kg    Examination: General exam: Alert, awake, oriented x 3. Patient showing less work of  breathing, requiring 6-7 L of oxygen supplementation via nasal cannula.  Respiratory system: Increased air movement from yesterday. Fine crackles at the bases. No wheezing or rhonchi. Improved respiratory effort from yesterday. Cardiovascular system: Controlled rate. Mild systolic ejection murmur appreciated. No rubs.Positive  metallic click.  Gastrointestinal system: Abdomen is nondistended, soft and nontender. No organomegaly or masses felt. Normal bowel sounds heard. Central nervous system: Alert and oriented. No focal neurological deficits. Extremities: No C/C/E, +pedal pulses Skin: No petechiae, no open wounds; chronic changes from venous stasis as mentioned above. Psychiatry: Judgement and insight appear normal. Mood & affect appropriate.    Data Reviewed: I have personally reviewed following labs and imaging studies  CBC: Recent Labs  Lab 09/01/18 1722 09/02/18 0617  WBC 10.0 7.8  NEUTROABS 7.5  --   HGB 9.5* 8.7*  HCT 33.9* 31.6*  MCV 90.6 90.0  PLT 332 213   Basic Metabolic Panel: Recent Labs  Lab 09/01/18 1722 09/02/18 0617  NA 139 141  K 3.2* 3.8  CL 98 98  CO2 32 35*  GLUCOSE 100* 79  BUN 12 11  CREATININE 1.11* 1.03*  CALCIUM 8.6* 8.7*   GFR: Estimated Creatinine Clearance: 62.2 mL/min (A) (by C-G formula based on SCr of 1.03 mg/dL (H)).   Liver Function Tests: Recent Labs  Lab 09/02/18 0617  AST 23  ALT 13  ALKPHOS 91  BILITOT 0.9  PROT 7.0  ALBUMIN 2.9*   Coagulation Profile: Recent Labs  Lab 09/01/18 1722 09/03/18 0637  INR 2.80 4.28*   CBG: Recent Labs  Lab 09/02/18 0741  GLUCAP 78*    Radiology Studies: Dg Chest Port 1 View  Result Date: 09/01/2018 CLINICAL DATA:  Lower extremity and periorbital edema. EXAM: PORTABLE CHEST 1 VIEW COMPARISON:  07/26/2018 FINDINGS: Unchanged moderate cardiomegaly with findings of prior median sternotomy and valve replacement. There is mild interstitial pulmonary edema. Small right pleural effusion with associated atelectasis. IMPRESSION: Cardiomegaly with mild interstitial pulmonary edema and small right pleural effusion. Electronically Signed   By: Ulyses Jarred M.D.   On: 09/01/2018 17:47   Scheduled Meds: . allopurinol  100 mg Oral Daily  . arformoterol  15 mcg Nebulization BID  . bisoprolol  5 mg Oral Daily  .  budesonide (PULMICORT) nebulizer solution  0.5 mg Nebulization BID  . diltiazem  120 mg Oral Daily  . furosemide  40 mg Intravenous Q8H  . ipratropium-albuterol  3 mL Nebulization Q6H  . levothyroxine  50 mcg Oral Q0600  . magnesium oxide  400 mg Oral Daily  . methylPREDNISolone (SOLU-MEDROL) injection  40 mg Intravenous Q6H  . potassium chloride  20 mEq Oral BID  . potassium chloride  40 mEq Oral Once  . rosuvastatin  10 mg Oral QHS  . sodium chloride flush  3 mL Intravenous Q12H  . Warfarin - Pharmacist Dosing Inpatient   Does not apply q1800   Continuous Infusions: . sodium chloride       LOS: 1 day    Time spent: 35 minutes. Greater than 50% of this time was spent in direct contact with the patient, coordinating care and discussing relevant ongoing clinical issues, including extensive discussion regarding importance of low-sodium diet, need for IV diuresis and also acute treatment/management for her COPD exacerbation.  Patient showing improvement and is feeling better. Still SOB with minimal exertion, still unable to speak in full sentences. She is requiring 6-7 L of oxygen supplementation via nasal cannula.   Barton Dubois, MD [facility] Pager [phone]  If 7PM-7AM, please contact night-coverage [email or web address] Password [email or web address] 09/03/2018, 12:55 PM

## 2018-09-03 NOTE — Progress Notes (Signed)
[place] for warfarin Indication: mechanical valve  Allergies  Allergen Reactions  . Penicillins Hives, Itching, Swelling and Rash    Has patient had a PCN reaction causing immediate rash, facial/tongue/throat swelling, SOB or lightheadedness with hypotension: Yes Has patient had a PCN reaction causing severe rash involving mucus membranes or skin necrosis: Yes Has patient had a PCN reaction that required hospitalization: unknown Has patient had a PCN reaction occurring within the last 10 years: No If all of the above answers are "NO", then may proceed with Cephalosporin use.    Throat swelling    Patient Measurements: Height: 5\' 8"  (172.7 cm) Weight: 192 lb 7.4 oz (87.3 kg) IBW/kg (Calculated) : 63.9   Vital Signs: Temp: 98 F (36.7 C) (11/15 0548) Temp Source: Oral (11/15 0548) BP: 111/77 (11/15 0548) Pulse Rate: 80 (11/15 0548)  Labs: Recent Labs    09/01/18 1722 09/02/18 0617 09/03/18 0637  HGB 9.5* 8.7*  --   HCT 33.9* 31.6*  --   PLT 332 277  --   LABPROT 29.1*  --  40.4*  INR 2.80  --  4.28*  CREATININE 1.11* 1.03*  --     Estimated Creatinine Clearance: 62.2 mL/min (A) (by C-G formula based on SCr of 1.03 mg/dL (H)).   Medical History: Past Medical History:  Diagnosis Date  . Acute on chronic diastolic (congestive) heart failure ([place])   . Anxiety disorder   . Aortic stenosis    Status post St. Jude mechanical AVR 2007  . Asthma   . Atrial fibrillation ([place])   . Carcinoid tumor of colon 2007  . Coronary atherosclerosis of native coronary artery    Status post CABG 2007  . GERD (gastroesophageal reflux disease)   . History of colonoscopy 2003   Dr. Gala Romney - normal  . Hyperlipidemia   . Macromastia   . Pedal edema    Bilateral, chronic  . Peptic stricture of esophagus 10/04/2010   GE junction on last EGD by Dr. Gala Romney, benign biopsies  . RLS (restless legs syndrome)   . Schatzki's ring     Medications:   Medications Prior to Admission  Medication Sig Dispense Refill Last Dose  . albuterol (PROVENTIL HFA;VENTOLIN HFA) 108 (90 BASE) MCG/ACT inhaler Inhale 1-2 puffs into the lungs every 6 (six) hours as needed for wheezing or shortness of breath.    09/01/2018 at Unknown time  . allopurinol (ZYLOPRIM) 100 MG tablet Take 100 mg by mouth daily.   09/01/2018 at Unknown time  . ALPRAZolam (XANAX) 0.25 MG tablet Take 1 tablet (0.25 mg total) by mouth 2 (two) times daily as needed for anxiety. (Patient taking differently: Take 1 mg by mouth 2 (two) times daily as needed for anxiety. ) 10 tablet 0 09/01/2018 at Unknown time  . bisoprolol (ZEBETA) 5 MG tablet Take 1 tablet (5 mg total) by mouth daily. 30 tablet 1 09/01/2018 at 800a  . budesonide-formoterol (SYMBICORT) 160-4.5 MCG/ACT inhaler Inhale 2 puffs into the lungs 2 (two) times daily. 1 Inhaler 3 09/01/2018 at Unknown time  . calcium carbonate (OS-CAL) 600 MG TABS tablet Take 600 mg by mouth 2 (two) times daily.   09/01/2018 at Unknown time  . cetirizine (ZYRTEC) 10 MG tablet Take 10 mg by mouth daily.     09/01/2018 at Unknown time  . CRESTOR 10 MG tablet Take 10 mg by mouth at bedtime.    08/31/2018 at Unknown time  . cyclobenzaprine (FLEXERIL) 10 MG tablet Take 10  mg by mouth daily as needed for muscle spasms.   unknown  . diltiazem (CARDIZEM CD) 120 MG 24 hr capsule Take 1 capsule (120 mg total) by mouth daily. 30 capsule 2 09/01/2018 at Unknown time  . furosemide (LASIX) 80 MG tablet Take 1 tablet (80 mg total) by mouth 2 (two) times daily. 60 tablet 1 09/01/2018 at Unknown time  . HYDROcodone-acetaminophen (NORCO) 10-325 MG tablet Take 1 tablet by mouth daily as needed for moderate pain. Normally takes one tablet at bedtime for back pain   Past Week at Unknown time  . levothyroxine (SYNTHROID, LEVOTHROID) 50 MCG tablet Take 50 mcg by mouth daily before breakfast.    09/01/2018 at Unknown time  . magnesium oxide (MAG-OX) 400 MG tablet Take 400 mg  by mouth daily.   09/01/2018 at Unknown time  . Multiple Vitamin (MULTIVITAMIN WITH MINERALS) TABS tablet Take 1 tablet by mouth daily.   09/01/2018 at Unknown time  . omeprazole (PRILOSEC) 20 MG capsule Take 1 capsule (20 mg total) by mouth 2 (two) times daily before a meal. 60 capsule 5 09/01/2018 at Unknown time  . OXYGEN Inhale 2 L into the lungs daily.    09/01/2018 at Unknown time  . potassium chloride (KLOR-CON) 20 MEQ packet Take 20 mEq by mouth 2 (two) times daily.    09/01/2018 at Unknown time  . rOPINIRole (REQUIP) 3 MG tablet Take 3 mg by mouth 2 (two) times daily as needed (for Restless leg syndrome).    unknown at Unknown time  . temazepam (RESTORIL) 30 MG capsule Take 30 mg by mouth at bedtime as needed for sleep.    unknown  . warfarin (COUMADIN) 4 MG tablet Take 4-6 mg by mouth See admin instructions. 6mg  on MWF and take 4mg  on all other days   08/31/2018 at 1800  . predniSONE (DELTASONE) 10 MG tablet Take 6 tablets (60 mg total) by mouth daily with breakfast. And decrease by one tablet daily (Patient not taking: Reported on 09/01/2018) 21 tablet 0 Not Taking at Unknown time  . warfarin (COUMADIN) 2 MG tablet Takes one tablet (2mg ) everyday (Patient not taking: Reported on 09/01/2018) 45 tablet 3 Not Taking at Unknown time    Assessment: Pharmacy consulted to warfarin for mechanical AVR 2007 and atrial fibrillation. Last dose of warfarin prior to admission on 11/12.   Latest anticoag visit note states patient is to take 4 mg on Tuesday and 3 mg ROW. INR is now supratherapeutic at 4.28.  Goal of Therapy:  INR 2-3 Monitor platelets by anticoagulation protocol: Yes   Plan:  Hold warfarin x 1 dose Monitor daily INR and s/s of bleeding  Ramond Craver 09/03/2018,8:44 AM

## 2018-09-03 NOTE — Care Management Note (Signed)
Case Management Note  Patient Details  Name: [name] MRN: [id number] Date of Birth: Jul 25, 1951         Admitted with [facility]. PT from home, lives with husband. Pt active with [facility] per husband. CM has placed call to verify with [facility] rep. Pt has had 5 admissions and 1 ED visit in 6 months. Pt reports compliance with daily weights and med compliance with diuretics, [facility] endorses this. Pt has home O2 and all necessary DME. Pt and husband anticipate DC home with resumption of [place] services. Brief chart review has referral to [facility] care management being made last admission but no follow up documentation present. CM will make referral for hospital follow up.                 Expected Discharge Date:  09/06/18               Expected Discharge Plan:  [place]  In-House Referral:  NA  Discharge planning Services  CM Consult  Post Acute Care Choice:  Home Health Choice offered to:  Patient, Spouse  DME Arranged:    DME Agency:     HH Arranged:  RN, PT HH Agency:  [place]  Status of Service:  In process, will continue to follow  Sherald Barge, RN 09/03/2018, 12:50 PM

## 2018-09-04 DIAGNOSIS — I5023 Acute on chronic systolic (congestive) heart failure: Secondary | ICD-10-CM

## 2018-09-04 LAB — BASIC METABOLIC PANEL
ANION GAP: 9 (ref 5–15)
BUN: 24 mg/dL — AB (ref 8–23)
CO2: 34 mmol/L — ABNORMAL HIGH (ref 22–32)
CREATININE: 1 mg/dL (ref 0.44–1.00)
Calcium: 8.9 mg/dL (ref 8.9–10.3)
Chloride: 95 mmol/L — ABNORMAL LOW (ref 98–111)
GFR calc Af Amer: >60 mL/min (ref >60)
GFR, EST NON AFRICAN AMERICAN: 57 mL/min — AB (ref >60)
GLUCOSE: 141 mg/dL — AB (ref 70–99)
Potassium: 3.5 mmol/L (ref 3.5–5.1)
Sodium: 138 mmol/L (ref 135–145)

## 2018-09-04 LAB — PROTIME-INR
INR: 4.98
PROTHROMBIN TIME: 45.4 s — AB (ref 11.4–15.2)

## 2018-09-04 MED ORDER — TEMAZEPAM 15 MG PO CAPS
15.0000 mg | ORAL_CAPSULE | Freq: Every evening | ORAL | Status: DC | PRN
  Administered 2018-09-04: 15 mg via ORAL
  Filled 2018-09-04: qty 1

## 2018-09-04 MED ORDER — METHYLPREDNISOLONE SODIUM SUCC 125 MG IJ SOLR
60.0000 mg | Freq: Two times a day (BID) | INTRAMUSCULAR | Status: DC
  Administered 2018-09-05: 60 mg via INTRAVENOUS
  Filled 2018-09-04 (×2): qty 2

## 2018-09-04 NOTE — Progress Notes (Signed)
PROGRESS NOTE    [name]  PPI:[id number] DOB: Jan 25, 1951 DOA: 09/01/2018 PCP: Octavio Graves, DO     Brief Narrative:  67 y/o female with a history of chronic respiratory failure currently on 2L home oxygen, COPD, chronic diastolic CHF, aortic stenosis status post mechanical AVR and atrial fibrillation (chronically on Coumadin, CAD status post CABG 2007, CKD stage III who came to the hospital complaining of shortness of breath. Patient states that she has fluid overload and felt that she was having difficulty breathing and decided to come to the hospital.   Assessment & Plan: Acute on chronic respiratory failure secondary to COPD and CHF exacerbation (uses 2 L nasal cannula supplementation at baseline). -Chest X ray showing cardiomegaly with mild interstitial pulmonary edema and small right pleural effusion -BNP at 958 on admission. -Continue IV diuretics (Lasix 40 mg TID) -Strict intake/output (-7.5 L since andmission) -Continue to follow daily weights and continue low sodium diet.   -recent Echo (07/27/18) demonstrated preserved ejection fraction, with no regional wall motion abnormalities. Patient has a St. Jude mechanical prothesis in aortic position. Will not repeat echo.  -For her COPD component will continue steroids (will start slow tapering) and Duoneb. -Continue Pulmicort and Brovana.  -Continue using flutter valve.  -Continue oxygen supplementation and wean down as tolerated; patient currently using 5 L of supplemental oxygen via [place].   Atrial fibrillation/status post mechanical AVR -Heart rate is controlled -Continue Cardizem and Bisoprolol.  -Continue to hold Coumadin (as per pharmacy); INR continues to be supra therapeutic at 4.98; goal 2.5-3.5.  Hypokalemia -K on admission was 3.2 -Repleted, K within normal limits -Will continue to monitor electrolytes trend and further replete as needed.   Hyperlipidemia -Continue statins  Hypothyroidism  -Continue  Synthroid   Hx of anxiety -Patient did not appear to be in an anxious state -Continue Alprazolam  Hx of gout -No acute flare appreciated -Continue allopurinol  Hx of restless leg syndrome -Continue Ropirinole  DVT prophylaxis: Coumadin Code Status: Full code Family Communication: Husband at bedside. All questions and concerns were addressed.   Disposition Plan: Patient has continued to improve her breathing, but still needs further IV diuresis, IV steroids and to wean oxygen to baseline. Patient is still not ready for discharge.    Consultants:   None  Procedures:   Chest X-ray  See results below  Antimicrobials:  Anti-infectives (From admission, onward)   None       Subjective: Patient is still having minor difficulty speaking in full sentences;but showing improvement and feeling better. Patient has less signs of fluid overload secondary to increased urine output since admission (up to 7.5 L in urine output). Still requiring 5 L of [place] supplemental oxygen. No fevers, no chest pain, no nausea, no vomiting.    Objective: Vitals:   09/03/18 2206 09/04/18 0114 09/04/18 0620 09/04/18 0756  BP: 127/86  120/87   Pulse: 97  95   Resp: 18  18   Temp: 98.1 F (36.7 C)  98.2 F (36.8 C)   TempSrc: Oral  Oral   SpO2: 95% 93% 95% 97%  Weight:      Height:        Intake/Output Summary (Last 24 hours) at 09/04/2018 1017 Last data filed at 09/04/2018 0900 Gross per 24 hour  Intake 960 ml  Output 1000 ml  Net -40 ml   Filed Weights   09/01/18 1704 09/02/18 0030  Weight: 87.1 kg 87.3 kg    Examination:  General exam: Alert,  awake, oriented x 3. Patient showing improvement of work of breathing, requiring 5 L of oxygen supplementation via nasal cannula.  Respiratory system: Good air movement. Mild fine crackles at the bases. No wheezing or rhonchi. Improved respiratory effort from yesterday.  Cardiovascular system: Controlled rate. Mild systolic ejection murmur  appreciated. No rubs. Positive metallic click.  Gastrointestinal system: Abdomen is nondistended, soft and nontender. No organomegaly or masses felt. Normal bowel sounds heard. Central nervous system: Alert and oriented. No focal neurological deficits. Extremities: No C/C/E, +pedal pulses Skin: No petechiae, no open wounds; chronic changes from venous stasis as mentioned above.  Psychiatry: Judgement and insight appear normal. Mood & affect appropriate.   Data Reviewed: I have personally reviewed following labs and imaging studies  CBC: Recent Labs  Lab 09/01/18 1722 09/02/18 0617  WBC 10.0 7.8  NEUTROABS 7.5  --   HGB 9.5* 8.7*  HCT 33.9* 31.6*  MCV 90.6 90.0  PLT 332 244   Basic Metabolic Panel: Recent Labs  Lab 09/01/18 1722 09/02/18 0617 09/04/18 0646  NA 139 141 138  K 3.2* 3.8 3.5  CL 98 98 95*  CO2 32 35* 34*  GLUCOSE 100* 79 141*  BUN 12 11 24*  CREATININE 1.11* 1.03* 1.00  CALCIUM 8.6* 8.7* 8.9   GFR: Estimated Creatinine Clearance: 64 mL/min (by C-G formula based on SCr of 1 mg/dL).   Liver Function Tests: Recent Labs  Lab 09/02/18 0617  AST 23  ALT 13  ALKPHOS 91  BILITOT 0.9  PROT 7.0  ALBUMIN 2.9*   Coagulation Profile: Recent Labs  Lab 09/01/18 1722 09/03/18 0637 09/04/18 0646  INR 2.80 4.28* 4.98*   CBG: Recent Labs  Lab 09/02/18 0741  GLUCAP 69*    Radiology Studies: No results found. Scheduled Meds: . allopurinol  100 mg Oral Daily  . arformoterol  15 mcg Nebulization BID  . bisoprolol  5 mg Oral Daily  . budesonide (PULMICORT) nebulizer solution  0.5 mg Nebulization BID  . diltiazem  120 mg Oral Daily  . furosemide  40 mg Intravenous Q8H  . ipratropium-albuterol  3 mL Nebulization Q6H  . levothyroxine  50 mcg Oral Q0600  . magnesium oxide  400 mg Oral Daily  . methylPREDNISolone (SOLU-MEDROL) injection  40 mg Intravenous Q6H  . potassium chloride  20 mEq Oral BID  . potassium chloride  40 mEq Oral Once  . rosuvastatin   10 mg Oral QHS  . sodium chloride flush  3 mL Intravenous Q12H  . Warfarin - Pharmacist Dosing Inpatient   Does not apply q1800   Continuous Infusions: . sodium chloride       LOS: 2 days   Time spent: 30 minutes.   Barton Dubois, MD [facility] Pager [phone]  If 7PM-7AM, please contact night-coverage [email or web address] Password [email or web address] 09/04/2018, 10:17 AM

## 2018-09-04 NOTE — Plan of Care (Signed)
  Problem: Clinical Measurements: Goal: Diagnostic test results will improve Outcome: Progressing Goal: Respiratory complications will improve Outcome: Progressing   Problem: Nutrition: Goal: Adequate nutrition will be maintained Outcome: Progressing   Problem: Coping: Goal: Level of anxiety will decrease Outcome: Progressing

## 2018-09-04 NOTE — Progress Notes (Signed)
[place] for warfarin Indication: mechanical valve  Allergies  Allergen Reactions  . Penicillins Hives, Itching, Swelling and Rash    Has patient had a PCN reaction causing immediate rash, facial/tongue/throat swelling, SOB or lightheadedness with hypotension: Yes Has patient had a PCN reaction causing severe rash involving mucus membranes or skin necrosis: Yes Has patient had a PCN reaction that required hospitalization: unknown Has patient had a PCN reaction occurring within the last 10 years: No If all of the above answers are "NO", then may proceed with Cephalosporin use.    Throat swelling    Patient Measurements: Height: 5\' 8"  (172.7 cm) Weight: 192 lb 7.4 oz (87.3 kg) IBW/kg (Calculated) : 63.9   Vital Signs: Temp: 98.2 F (36.8 C) (11/16 0620) Temp Source: Oral (11/16 0620) BP: 120/87 (11/16 0620) Pulse Rate: 95 (11/16 0620)  Labs: Recent Labs    09/01/18 1722 09/02/18 0617 09/03/18 0637 09/04/18 0646  HGB 9.5* 8.7*  --   --   HCT 33.9* 31.6*  --   --   PLT 332 277  --   --   LABPROT 29.1*  --  40.4* 45.4*  INR 2.80  --  4.28* 4.98*  CREATININE 1.11* 1.03*  --  1.00    Estimated Creatinine Clearance: 64 mL/min (by C-G formula based on SCr of 1 mg/dL).   Medical History: Past Medical History:  Diagnosis Date  . Acute on chronic diastolic (congestive) heart failure ([place])   . Anxiety disorder   . Aortic stenosis    Status post St. Jude mechanical AVR 2007  . Asthma   . Atrial fibrillation ([place])   . Carcinoid tumor of colon 2007  . Coronary atherosclerosis of native coronary artery    Status post CABG 2007  . GERD (gastroesophageal reflux disease)   . History of colonoscopy 2003   Dr. Gala Romney - normal  . Hyperlipidemia   . Macromastia   . Pedal edema    Bilateral, chronic  . Peptic stricture of esophagus 10/04/2010   GE junction on last EGD by Dr. Gala Romney, benign biopsies  . RLS (restless legs syndrome)   .  Schatzki's ring     Medications:  Medications Prior to Admission  Medication Sig Dispense Refill Last Dose  . albuterol (PROVENTIL HFA;VENTOLIN HFA) 108 (90 BASE) MCG/ACT inhaler Inhale 1-2 puffs into the lungs every 6 (six) hours as needed for wheezing or shortness of breath.    09/01/2018 at Unknown time  . allopurinol (ZYLOPRIM) 100 MG tablet Take 100 mg by mouth daily.   09/01/2018 at Unknown time  . ALPRAZolam (XANAX) 0.25 MG tablet Take 1 tablet (0.25 mg total) by mouth 2 (two) times daily as needed for anxiety. (Patient taking differently: Take 1 mg by mouth 2 (two) times daily as needed for anxiety. ) 10 tablet 0 09/01/2018 at Unknown time  . bisoprolol (ZEBETA) 5 MG tablet Take 1 tablet (5 mg total) by mouth daily. 30 tablet 1 09/01/2018 at 800a  . budesonide-formoterol (SYMBICORT) 160-4.5 MCG/ACT inhaler Inhale 2 puffs into the lungs 2 (two) times daily. 1 Inhaler 3 09/01/2018 at Unknown time  . calcium carbonate (OS-CAL) 600 MG TABS tablet Take 600 mg by mouth 2 (two) times daily.   09/01/2018 at Unknown time  . cetirizine (ZYRTEC) 10 MG tablet Take 10 mg by mouth daily.     09/01/2018 at Unknown time  . CRESTOR 10 MG tablet Take 10 mg by mouth at bedtime.    08/31/2018  at Unknown time  . cyclobenzaprine (FLEXERIL) 10 MG tablet Take 10 mg by mouth daily as needed for muscle spasms.   unknown  . diltiazem (CARDIZEM CD) 120 MG 24 hr capsule Take 1 capsule (120 mg total) by mouth daily. 30 capsule 2 09/01/2018 at Unknown time  . furosemide (LASIX) 80 MG tablet Take 1 tablet (80 mg total) by mouth 2 (two) times daily. 60 tablet 1 09/01/2018 at Unknown time  . HYDROcodone-acetaminophen (NORCO) 10-325 MG tablet Take 1 tablet by mouth daily as needed for moderate pain. Normally takes one tablet at bedtime for back pain   Past Week at Unknown time  . levothyroxine (SYNTHROID, LEVOTHROID) 50 MCG tablet Take 50 mcg by mouth daily before breakfast.    09/01/2018 at Unknown time  . magnesium oxide  (MAG-OX) 400 MG tablet Take 400 mg by mouth daily.   09/01/2018 at Unknown time  . Multiple Vitamin (MULTIVITAMIN WITH MINERALS) TABS tablet Take 1 tablet by mouth daily.   09/01/2018 at Unknown time  . omeprazole (PRILOSEC) 20 MG capsule Take 1 capsule (20 mg total) by mouth 2 (two) times daily before a meal. 60 capsule 5 09/01/2018 at Unknown time  . OXYGEN Inhale 2 L into the lungs daily.    09/01/2018 at Unknown time  . potassium chloride (KLOR-CON) 20 MEQ packet Take 20 mEq by mouth 2 (two) times daily.    09/01/2018 at Unknown time  . rOPINIRole (REQUIP) 3 MG tablet Take 3 mg by mouth 2 (two) times daily as needed (for Restless leg syndrome).    unknown at Unknown time  . temazepam (RESTORIL) 30 MG capsule Take 30 mg by mouth at bedtime as needed for sleep.    unknown  . warfarin (COUMADIN) 4 MG tablet Take 4-6 mg by mouth See admin instructions. 6mg  on MWF and take 4mg  on all other days   08/31/2018 at 1800  . predniSONE (DELTASONE) 10 MG tablet Take 6 tablets (60 mg total) by mouth daily with breakfast. And decrease by one tablet daily (Patient not taking: Reported on 09/01/2018) 21 tablet 0 Not Taking at Unknown time  . warfarin (COUMADIN) 2 MG tablet Takes one tablet (2mg ) everyday (Patient not taking: Reported on 09/01/2018) 45 tablet 3 Not Taking at Unknown time    Assessment: Pharmacy consulted to warfarin for mechanical AVR 2007 and atrial fibrillation. Last dose of warfarin prior to admission on 11/12.   Latest anticoag visit note states patient is to take 4 mg on Tuesday and 3 mg ROW. INR is now supratherapeutic at 4.98.  Goal of Therapy:  INR 2-3 Monitor platelets by anticoagulation protocol: Yes   Plan:  Hold warfarin x 1 dose Monitor daily INR and s/s of bleeding  Donna Christen Akon Reinoso 09/04/2018,9:12 AM

## 2018-09-05 DIAGNOSIS — I10 Essential (primary) hypertension: Secondary | ICD-10-CM

## 2018-09-05 DIAGNOSIS — I482 Chronic atrial fibrillation, unspecified: Secondary | ICD-10-CM

## 2018-09-05 DIAGNOSIS — Z952 Presence of prosthetic heart valve: Secondary | ICD-10-CM

## 2018-09-05 DIAGNOSIS — Z7901 Long term (current) use of anticoagulants: Secondary | ICD-10-CM

## 2018-09-05 LAB — BASIC METABOLIC PANEL
Anion gap: 8 (ref 5–15)
BUN: 35 mg/dL — ABNORMAL HIGH (ref 8–23)
CALCIUM: 8.7 mg/dL — AB (ref 8.9–10.3)
CO2: 36 mmol/L — ABNORMAL HIGH (ref 22–32)
Chloride: 95 mmol/L — ABNORMAL LOW (ref 98–111)
Creatinine, Ser: 1.11 mg/dL — ABNORMAL HIGH (ref 0.44–1.00)
GFR, EST AFRICAN AMERICAN: 59 mL/min — AB (ref >60)
GFR, EST NON AFRICAN AMERICAN: 51 mL/min — AB (ref >60)
GLUCOSE: 148 mg/dL — AB (ref 70–99)
POTASSIUM: 3.8 mmol/L (ref 3.5–5.1)
Sodium: 139 mmol/L (ref 135–145)

## 2018-09-05 LAB — CBC
HCT: 34 % — ABNORMAL LOW (ref 36.0–46.0)
HEMOGLOBIN: 9.6 g/dL — AB (ref 12.0–15.0)
MCH: 24.8 pg — ABNORMAL LOW (ref 26.0–34.0)
MCHC: 28.2 g/dL — ABNORMAL LOW (ref 30.0–36.0)
MCV: 87.9 fL (ref 80.0–100.0)
PLATELETS: 349 10*3/uL (ref 150–400)
RBC: 3.87 MIL/uL (ref 3.87–5.11)
RDW: 20.2 % — ABNORMAL HIGH (ref 11.5–15.5)
WBC: 10 10*3/uL (ref 4.0–10.5)
nRBC: 0 % (ref 0.0–0.2)

## 2018-09-05 LAB — PROTIME-INR
INR: 3.95
PROTHROMBIN TIME: 38 s — AB (ref 11.4–15.2)

## 2018-09-05 MED ORDER — WARFARIN SODIUM 2 MG PO TABS: 3.0000 mg | ORAL_TABLET | ORAL | Status: AC

## 2018-09-05 MED ORDER — METOLAZONE 2.5 MG PO TABS: ORAL_TABLET | ORAL | 0 refills | Status: DC

## 2018-09-05 MED ORDER — FUROSEMIDE 80 MG PO TABS: 120.0000 mg | ORAL_TABLET | Freq: Two times a day (BID) | ORAL | 1 refills | Status: DC

## 2018-09-05 MED ORDER — IPRATROPIUM-ALBUTEROL 20-100 MCG/ACT IN AERS: 1.0000 | INHALATION_SPRAY | Freq: Four times a day (QID) | RESPIRATORY_TRACT | 3 refills | Status: DC | PRN

## 2018-09-05 MED ORDER — METHYLPREDNISOLONE SODIUM SUCC 40 MG IJ SOLR: 40.0000 mg | Freq: Two times a day (BID) | INTRAMUSCULAR | Status: DC

## 2018-09-05 MED ORDER — PREDNISONE 20 MG PO TABS: ORAL_TABLET | ORAL | 0 refills | Status: DC

## 2018-09-05 MED ORDER — WARFARIN SODIUM 4 MG PO TABS: 4.0000 mg | ORAL_TABLET | ORAL | Status: DC

## 2018-09-05 NOTE — Progress Notes (Signed)
[place] for warfarin Indication: mechanical valve  Allergies  Allergen Reactions  . Penicillins Hives, Itching, Swelling and Rash    Has patient had a PCN reaction causing immediate rash, facial/tongue/throat swelling, SOB or lightheadedness with hypotension: Yes Has patient had a PCN reaction causing severe rash involving mucus membranes or skin necrosis: Yes Has patient had a PCN reaction that required hospitalization: unknown Has patient had a PCN reaction occurring within the last 10 years: No If all of the above answers are "NO", then may proceed with Cephalosporin use.    Throat swelling    Patient Measurements: Height: 5\' 8"  (172.7 cm) Weight: 192 lb 7.4 oz (87.3 kg) IBW/kg (Calculated) : 63.9   Vital Signs: Temp: 98.1 F (36.7 C) (11/17 0558) Temp Source: Oral (11/17 0558) BP: 131/87 (11/17 0558) Pulse Rate: 82 (11/17 0558)  Labs: Recent Labs    09/03/18 0637 09/04/18 0646 09/05/18 0606  HGB  --   --  9.6*  HCT  --   --  34.0*  PLT  --   --  349  LABPROT 40.4* 45.4* 38.0*  INR 4.28* 4.98* 3.95  CREATININE  --  1.00 1.11*    Estimated Creatinine Clearance: 57.7 mL/min (A) (by C-G formula based on SCr of 1.11 mg/dL (H)).   Medical History: Past Medical History:  Diagnosis Date  . Acute on chronic diastolic (congestive) heart failure ([place])   . Anxiety disorder   . Aortic stenosis    Status post St. Jude mechanical AVR 2007  . Asthma   . Atrial fibrillation ([place])   . Carcinoid tumor of colon 2007  . Coronary atherosclerosis of native coronary artery    Status post CABG 2007  . GERD (gastroesophageal reflux disease)   . History of colonoscopy 2003   Dr. Gala Romney - normal  . Hyperlipidemia   . Macromastia   . Pedal edema    Bilateral, chronic  . Peptic stricture of esophagus 10/04/2010   GE junction on last EGD by Dr. Gala Romney, benign biopsies  . RLS (restless legs syndrome)   . Schatzki's ring      Medications:  Medications Prior to Admission  Medication Sig Dispense Refill Last Dose  . albuterol (PROVENTIL HFA;VENTOLIN HFA) 108 (90 BASE) MCG/ACT inhaler Inhale 1-2 puffs into the lungs every 6 (six) hours as needed for wheezing or shortness of breath.    09/01/2018 at Unknown time  . allopurinol (ZYLOPRIM) 100 MG tablet Take 100 mg by mouth daily.   09/01/2018 at Unknown time  . ALPRAZolam (XANAX) 0.25 MG tablet Take 1 tablet (0.25 mg total) by mouth 2 (two) times daily as needed for anxiety. (Patient taking differently: Take 1 mg by mouth 2 (two) times daily as needed for anxiety. ) 10 tablet 0 09/01/2018 at Unknown time  . bisoprolol (ZEBETA) 5 MG tablet Take 1 tablet (5 mg total) by mouth daily. 30 tablet 1 09/01/2018 at 800a  . budesonide-formoterol (SYMBICORT) 160-4.5 MCG/ACT inhaler Inhale 2 puffs into the lungs 2 (two) times daily. 1 Inhaler 3 09/01/2018 at Unknown time  . calcium carbonate (OS-CAL) 600 MG TABS tablet Take 600 mg by mouth 2 (two) times daily.   09/01/2018 at Unknown time  . cetirizine (ZYRTEC) 10 MG tablet Take 10 mg by mouth daily.     09/01/2018 at Unknown time  . CRESTOR 10 MG tablet Take 10 mg by mouth at bedtime.    08/31/2018 at Unknown time  . cyclobenzaprine (FLEXERIL) 10 MG tablet  Take 10 mg by mouth daily as needed for muscle spasms.   unknown  . diltiazem (CARDIZEM CD) 120 MG 24 hr capsule Take 1 capsule (120 mg total) by mouth daily. 30 capsule 2 09/01/2018 at Unknown time  . furosemide (LASIX) 80 MG tablet Take 1 tablet (80 mg total) by mouth 2 (two) times daily. 60 tablet 1 09/01/2018 at Unknown time  . HYDROcodone-acetaminophen (NORCO) 10-325 MG tablet Take 1 tablet by mouth daily as needed for moderate pain. Normally takes one tablet at bedtime for back pain   Past Week at Unknown time  . levothyroxine (SYNTHROID, LEVOTHROID) 50 MCG tablet Take 50 mcg by mouth daily before breakfast.    09/01/2018 at Unknown time  . magnesium oxide (MAG-OX) 400 MG  tablet Take 400 mg by mouth daily.   09/01/2018 at Unknown time  . Multiple Vitamin (MULTIVITAMIN WITH MINERALS) TABS tablet Take 1 tablet by mouth daily.   09/01/2018 at Unknown time  . omeprazole (PRILOSEC) 20 MG capsule Take 1 capsule (20 mg total) by mouth 2 (two) times daily before a meal. 60 capsule 5 09/01/2018 at Unknown time  . OXYGEN Inhale 2 L into the lungs daily.    09/01/2018 at Unknown time  . potassium chloride (KLOR-CON) 20 MEQ packet Take 20 mEq by mouth 2 (two) times daily.    09/01/2018 at Unknown time  . rOPINIRole (REQUIP) 3 MG tablet Take 3 mg by mouth 2 (two) times daily as needed (for Restless leg syndrome).    unknown at Unknown time  . temazepam (RESTORIL) 30 MG capsule Take 30 mg by mouth at bedtime as needed for sleep.    unknown  . warfarin (COUMADIN) 4 MG tablet Take 4-6 mg by mouth See admin instructions. 6mg  on MWF and take 4mg  on all other days   08/31/2018 at 1800  . predniSONE (DELTASONE) 10 MG tablet Take 6 tablets (60 mg total) by mouth daily with breakfast. And decrease by one tablet daily (Patient not taking: Reported on 09/01/2018) 21 tablet 0 Not Taking at Unknown time  . warfarin (COUMADIN) 2 MG tablet Takes one tablet (2mg ) everyday (Patient not taking: Reported on 09/01/2018) 45 tablet 3 Not Taking at Unknown time    Assessment: Pharmacy consulted to warfarin for mechanical AVR 2007 and atrial fibrillation. Last dose of warfarin prior to admission on 11/12.   Latest anticoag visit note states patient is to take 4 mg on Tuesday and 3 mg ROW. INR is now supratherapeutic at 3.95.  Goal of Therapy:   INR 2-3 Monitor platelets by anticoagulation protocol: Yes   Plan:  Hold warfarin x 1 dose Monitor daily INR and s/s of bleeding  Donna Christen Amy Gothard 09/05/2018,9:43 AM

## 2018-09-05 NOTE — Discharge Instructions (Signed)
FOLLOW UP WITH PRIMARY DOCTOR AND HAVE BMET LAB DRAWN AND MONITOR VOLUME STATUS FOR FURTHER DIURESIS REGIMEN MONITOR SYMPTOMS OF COPD AND POSSIBLE ASSESSMENT BY PULMONOLOGIST

## 2018-09-05 NOTE — Discharge Summary (Signed)
Physician Discharge Summary  [name] PXT:[id number] DOB: 1951-01-17 DOA: 09/01/2018  PCP: Octavio Graves, DO  Admit date: 09/01/2018 Discharge date: 09/05/2018  Time spent: 35 minutes  Recommendations for Outpatient Follow-up:  1. Repeat BMET to follow electrolytes and renal function (patient diuresed until Cr started to go up). 2. Reassess volume status and further adjust diuretic regimen 3. Reassess resolution/course of symptoms from a COPD stand point and if needed arrange outpatient follow up with pulmonologist.    Discharge Diagnoses:  Acute on chronic respiratory failure with hypoxia (HCC) Atrial fibrillation, chronic COPD with acute exacerbation ([place]) Acute on chronic systolic HF exacerbation ([place]) H/O heart valve replacement with mechanical valve Benign essential HTN Hypothyroidism Hx of Gout HLD  Discharge Condition: stable and improved. Discharge home with instructions to follow up with PCP in 10 days and with cardiologist in 1 week.  Diet recommendation: heart healthy/low sodium diet.   Filed Weights   09/01/18 1704 09/02/18 0030  Weight: 87.1 kg 87.3 kg    History of present illness:  As per H&P written by Dr. Darrick Meigs on 09/01/18 67 y.o. female, with history of chronic diastolic CHF, aortic stenosis status post [employer] mechanical AVR in 2007, atrial fibrillation, CAD status post CABG 2007, CKD stage III who came to ED with chief complaint of shortness of breath.  She denies coughing up any phlegm.  Denies chest pain.  No nausea vomiting or diarrhea. In the ED, patient labs showed BNP 958, chest x-ray showed pulmonary edema, patient given 1 dose of Lasix 80 mg IV.  Patient became dyspneic on exertion in the ED with O2 sats dropping to 87% on 2 L of oxygen. No previous history of stroke or seizures. No nausea vomiting or diarrhea. Denies abdominal pain.  Hospital Course:  Acute on chronic respiratory failure secondary to COPD and CHF exacerbation (uses 2 L  nasal cannula supplementation at baseline). -Chest X ray showing cardiomegaly with mild interstitial pulmonary edema and small right pleural effusion -BNP at 958 -patient without crackles, no JVD and just trace Le edema at discharge. -neg approx 11L during hospitalization -discharge on lasix 120mg  BID and PRN metolazone. -outpatient follow up with cardiologist in 1 week. -continue to follow daily weights  -Importance of low sodium diet has been discussed with patient and husband at bedside. -recent Echo (07/27/18) demonstrated preserved ejection fraction, with no regional wall motion abnormalities. Patient has a [employer] mechanical prothesis in aortic position. Will not repeat echo  -Resume home regimen of symbicort; will discharge her on albuterol and ipatropium as needed and will complete steroids tapering.  -Patient was advised and taught how to use Flutter valve and encouraged to continue using it at discharge. -Continue oxygen supplementation; while achieving complete recovery will uses 3-4L around the clock.    Atrial fibrillation/status post mechanical AVR -Heart rate is controlled -Continue Cardizem and Bisoprolol -Hold Coumadin today as per pharmacy; INR is now supra therapeutic at 4.28; goal 2.5-3.5  Hypokalemia -K on admission was 3.2 -Repleted, K within normal limits -Will discharge on daily supplementation -repeat BMET at follow up visit to assess electrolyte trend.  Hyperlipidemia -Continue statins  Hypothyroidism  -Continue Synthroid   Hx of anxiety -Patient did not appear to be in an anxious state -Continue Alprazolam  Hx of gout -No acute flare appreciated -Continue allopurinol  Hx of restless leg syndrome -Continue Ropirinole  Procedures:   see below for x-ray reports   Consultations:  None   Discharge Exam: Vitals:   09/05/18  0558 09/05/18 0843  BP: 131/87   Pulse: 82   Resp: 18   Temp: 98.1 F (36.7 C)   SpO2: 95% 91%   General  exam: Alert, awake, oriented x 3. Patient showing improvement in  work of breathing and still requiring 3-4 L of oxygen supplementation via nasal cannula. able to speak in full sentences and asking to go home. Continue to have good urine output.  Respiratory system: Good air movement. No crackles, no wheezing, no using accessory muscles. Some shallow breathing pattern appreciated; but reported no SOB sensation and breathing at baseline.  Cardiovascular system: Controlled rate. Mild systolic ejection murmur appreciated. No rubs. Positive metallic click. No JVD appreciated. Gastrointestinal system: Abdomen is nondistended, soft and nontender. No organomegaly or masses felt. Normal bowel sounds heard. Central nervous system: Alert and oriented. No focal neurological deficits. Extremities: No Cyanosis or clubbing; trace edema bilaterally.  Skin: No petechiae, no open wounds; chronic changes from venous stasis as mentioned above.  Psychiatry: Judgement and insight appear normal. Mood & affect appropriate.   Discharge Instructions   Discharge Instructions    (HEART FAILURE PATIENTS) Call MD:  Anytime you have any of the following symptoms: 1) 3 pound weight gain in 24 hours or 5 pounds in 1 week 2) shortness of breath, with or without a dry hacking cough 3) swelling in the hands, feet or stomach 4) if you have to sleep on extra pillows at night in order to breathe.   Complete by:  As directed    Diet - low sodium heart healthy   Complete by:  As directed    Discharge instructions   Complete by:  As directed    Take medications as prescribed  Follow low sodium diet (< 2 gram sodium daily) Maintain adequate hydration, but keep fluid intake at 2L daily  Noticed changes in diuretics regimen Arrange follow up with cardiologist in 1 week. Wear oxygen supplementation 24/7 3 L (ok to increase up to 4 if needed for exertion). Follow up with PCP in 10 days.     Allergies as of 09/05/2018      Reactions    Penicillins Hives, Itching, Swelling, Rash   Has patient had a PCN reaction causing immediate rash, facial/tongue/throat swelling, SOB or lightheadedness with hypotension: Yes Has patient had a PCN reaction causing severe rash involving mucus membranes or skin necrosis: Yes Has patient had a PCN reaction that required hospitalization: unknown Has patient had a PCN reaction occurring within the last 10 years: No If all of the above answers are "NO", then may proceed with Cephalosporin use. Throat swelling      Medication List    STOP taking these medications   albuterol 108 (90 Base) MCG/ACT inhaler Commonly known as:  PROVENTIL HFA;VENTOLIN HFA     TAKE these medications   allopurinol 100 MG tablet Commonly known as:  ZYLOPRIM Take 100 mg by mouth daily.   ALPRAZolam 0.25 MG tablet Commonly known as:  XANAX Take 1 tablet (0.25 mg total) by mouth 2 (two) times daily as needed for anxiety. What changed:  how much to take   bisoprolol 5 MG tablet Commonly known as:  ZEBETA Take 1 tablet (5 mg total) by mouth daily.   budesonide-formoterol 160-4.5 MCG/ACT inhaler Commonly known as:  SYMBICORT Inhale 2 puffs into the lungs 2 (two) times daily.   calcium carbonate 600 MG Tabs tablet Commonly known as:  OS-CAL Take 600 mg by mouth 2 (two) times daily.   cetirizine  10 MG tablet Commonly known as:  ZYRTEC Take 10 mg by mouth daily.   CRESTOR 10 MG tablet Generic drug:  rosuvastatin Take 10 mg by mouth at bedtime.   cyclobenzaprine 10 MG tablet Commonly known as:  FLEXERIL Take 10 mg by mouth daily as needed for muscle spasms.   diltiazem 120 MG 24 hr capsule Commonly known as:  CARDIZEM CD Take 1 capsule (120 mg total) by mouth daily.   furosemide 80 MG tablet Commonly known as:  LASIX Take 1.5 tablets (120 mg total) by mouth 2 (two) times daily. What changed:  how much to take   HYDROcodone-acetaminophen 10-325 MG tablet Commonly known as:  NORCO Take 1 tablet  by mouth daily as needed for moderate pain. Normally takes one tablet at bedtime for back pain   Ipratropium-Albuterol 20-100 MCG/ACT Aers respimat Commonly known as:  COMBIVENT Inhale 1-2 puffs into the lungs every 6 (six) hours as needed for wheezing or shortness of breath.   levothyroxine 50 MCG tablet Commonly known as:  SYNTHROID, LEVOTHROID Take 50 mcg by mouth daily before breakfast.   magnesium oxide 400 MG tablet Commonly known as:  MAG-OX Take 400 mg by mouth daily.   metolazone 2.5 MG tablet Commonly known as:  ZAROXOLYN Take one tablet daily as needed if your weight go up mor ethan 3 pounds.   multivitamin with minerals Tabs tablet Take 1 tablet by mouth daily.   omeprazole 20 MG capsule Commonly known as:  PRILOSEC Take 1 capsule (20 mg total) by mouth 2 (two) times daily before a meal.   OXYGEN Inhale 2 L into the lungs daily.   potassium chloride 20 MEQ packet Commonly known as:  KLOR-CON Take 20 mEq by mouth 2 (two) times daily.   predniSONE 20 MG tablet Commonly known as:  DELTASONE Take 3 tablets daily X 1 day; then 2 tablets daily X 2 days; then 1 tablet daily X 2 days; then 1/2 tablet daily X 3 days and stop prednisone. What changed:    medication strength  how much to take  how to take this  when to take this  additional instructions   rOPINIRole 3 MG tablet Commonly known as:  REQUIP Take 3 mg by mouth 2 (two) times daily as needed (for Restless leg syndrome).   temazepam 30 MG capsule Commonly known as:  RESTORIL Take 30 mg by mouth at bedtime as needed for sleep.   warfarin 2 MG tablet Commonly known as:  COUMADIN Take as directed. If you are unsure how to take this medication, talk to your nurse or doctor. Original instructions:  Take 1.5-2 tablets (3-4 mg total) by mouth See admin instructions. 4mg  on Tuesday and 3 mg daily the rest of the week. Start taking on:  09/06/2018 What changed:    how much to take  how to take  this  when to take this  additional instructions  Another medication with the same name was removed. Continue taking this medication, and follow the directions you see here.      Allergies  Allergen Reactions  . Penicillins Hives, Itching, Swelling and Rash    Has patient had a PCN reaction causing immediate rash, facial/tongue/throat swelling, SOB or lightheadedness with hypotension: Yes Has patient had a PCN reaction causing severe rash involving mucus membranes or skin necrosis: Yes Has patient had a PCN reaction that required hospitalization: unknown Has patient had a PCN reaction occurring within the last 10 years: No If all of the  above answers are "NO", then may proceed with Cephalosporin use.    Throat swelling   Follow-up Information    Octavio Graves, DO Follow up in 10 day(s).   Contact information: [address] [address] [phone]        Satira Sark, MD. Schedule an appointment as soon as possible for a visit in 1 week(s).   Specialty:  Cardiology Contact information: [place] [address] [phone]           The results of significant diagnostics from this hospitalization (including imaging, microbiology, ancillary and laboratory) are listed below for reference.    Significant Diagnostic Studies: Dg Chest Port 1 View  Result Date: 09/01/2018 CLINICAL DATA:  Lower extremity and periorbital edema. EXAM: PORTABLE CHEST 1 VIEW COMPARISON:  07/26/2018 FINDINGS: Unchanged moderate cardiomegaly with findings of prior median sternotomy and valve replacement. There is mild interstitial pulmonary edema. Small right pleural effusion with associated atelectasis. IMPRESSION: Cardiomegaly with mild interstitial pulmonary edema and small right pleural effusion. Electronically Signed   By: Ulyses Jarred M.D.   On: 09/01/2018 17:47   Labs: Basic Metabolic Panel: Recent Labs  Lab 09/01/18 1722 09/02/18 0617  09/04/18 0646 09/05/18 0606  NA 139 141 138 139  K 3.2* 3.8 3.5 3.8  CL 98 98 95* 95*  CO2 32 35* 34* 36*  GLUCOSE 100* 79 141* 148*  BUN 12 11 24* 35*  CREATININE 1.11* 1.03* 1.00 1.11*  CALCIUM 8.6* 8.7* 8.9 8.7*   Liver Function Tests: Recent Labs  Lab 09/02/18 0617  AST 23  ALT 13  ALKPHOS 91  BILITOT 0.9  PROT 7.0  ALBUMIN 2.9*   CBC: Recent Labs  Lab 09/01/18 1722 09/02/18 0617 09/05/18 0606  WBC 10.0 7.8 10.0  NEUTROABS 7.5  --   --   HGB 9.5* 8.7* 9.6*  HCT 33.9* 31.6* 34.0*  MCV 90.6 90.0 87.9  PLT 332 277 349   BNP (last 3 results) Recent Labs    06/19/18 1402 07/26/18 1606 09/01/18 1722  BNP 610.0* 813.0* 958.0*   CBG: Recent Labs  Lab 09/02/18 0741  GLUCAP 69*    Signed:  Barton Dubois MD.  [facility] 09/05/2018, 11:40 AM

## 2018-09-05 NOTE — Progress Notes (Signed)
IV removed and discharge instructions reviewed with patient and husband.  Prescriptions sent to pharmacy and one printed prescription for prednisone given.  Son to drive home

## 2018-09-07 ENCOUNTER — Other Ambulatory Visit: Payer: Self-pay | Admitting: *Deleted

## 2018-09-07 NOTE — Patient Outreach (Signed)
Referral received from [place], pt hospitalized 11/13-11/17/19 for CHF exacerbation, respiratory failure with hypoxia, pt with history HTN, A-Fib, CKD stage 3, hypothyroidism, no answer to [phone], left voicemail requesting return phone call, no answer to [phone] and " mailbox full",  RN CM mailed unsuccessful outreach letter to patient's home.  PLAN Outreach pt 3-4 business days  Jacqlyn Larsen [facility], [place] Coordinator [phone]

## 2018-09-08 DIAGNOSIS — I5033 Acute on chronic diastolic (congestive) heart failure: Secondary | ICD-10-CM | POA: Diagnosis not present

## 2018-09-08 DIAGNOSIS — E039 Hypothyroidism, unspecified: Secondary | ICD-10-CM | POA: Diagnosis not present

## 2018-09-08 DIAGNOSIS — I482 Chronic atrial fibrillation, unspecified: Secondary | ICD-10-CM | POA: Diagnosis not present

## 2018-09-08 DIAGNOSIS — E785 Hyperlipidemia, unspecified: Secondary | ICD-10-CM | POA: Diagnosis not present

## 2018-09-08 DIAGNOSIS — I110 Hypertensive heart disease with heart failure: Secondary | ICD-10-CM | POA: Diagnosis not present

## 2018-09-08 DIAGNOSIS — I2510 Atherosclerotic heart disease of native coronary artery without angina pectoris: Secondary | ICD-10-CM | POA: Diagnosis not present

## 2018-09-08 DIAGNOSIS — K219 Gastro-esophageal reflux disease without esophagitis: Secondary | ICD-10-CM | POA: Diagnosis not present

## 2018-09-08 DIAGNOSIS — J441 Chronic obstructive pulmonary disease with (acute) exacerbation: Secondary | ICD-10-CM | POA: Diagnosis not present

## 2018-09-08 DIAGNOSIS — K222 Esophageal obstruction: Secondary | ICD-10-CM | POA: Diagnosis not present

## 2018-09-08 DIAGNOSIS — R69 Illness, unspecified: Secondary | ICD-10-CM | POA: Diagnosis not present

## 2018-09-10 ENCOUNTER — Other Ambulatory Visit: Payer: Self-pay | Admitting: *Deleted

## 2018-09-10 DIAGNOSIS — K219 Gastro-esophageal reflux disease without esophagitis: Secondary | ICD-10-CM | POA: Diagnosis not present

## 2018-09-10 DIAGNOSIS — I482 Chronic atrial fibrillation, unspecified: Secondary | ICD-10-CM | POA: Diagnosis not present

## 2018-09-10 DIAGNOSIS — R69 Illness, unspecified: Secondary | ICD-10-CM | POA: Diagnosis not present

## 2018-09-10 DIAGNOSIS — I110 Hypertensive heart disease with heart failure: Secondary | ICD-10-CM | POA: Diagnosis not present

## 2018-09-10 DIAGNOSIS — E039 Hypothyroidism, unspecified: Secondary | ICD-10-CM | POA: Diagnosis not present

## 2018-09-10 DIAGNOSIS — I5033 Acute on chronic diastolic (congestive) heart failure: Secondary | ICD-10-CM | POA: Diagnosis not present

## 2018-09-10 DIAGNOSIS — J441 Chronic obstructive pulmonary disease with (acute) exacerbation: Secondary | ICD-10-CM | POA: Diagnosis not present

## 2018-09-10 DIAGNOSIS — K222 Esophageal obstruction: Secondary | ICD-10-CM | POA: Diagnosis not present

## 2018-09-10 DIAGNOSIS — I2510 Atherosclerotic heart disease of native coronary artery without angina pectoris: Secondary | ICD-10-CM | POA: Diagnosis not present

## 2018-09-10 DIAGNOSIS — E785 Hyperlipidemia, unspecified: Secondary | ICD-10-CM | POA: Diagnosis not present

## 2018-09-10 NOTE — Patient Outreach (Signed)
Second attempt telephone call to pt for transition of care week 1, no answer to [phone] and left voicemail requesting return phone call, no answer to telephone [phone] and "mailbox full" per recording.    PLAN Outreach pt 3-4 business days  Jacqlyn Larsen [facility], [place] Coordinator [phone]

## 2018-09-13 DIAGNOSIS — E039 Hypothyroidism, unspecified: Secondary | ICD-10-CM | POA: Diagnosis not present

## 2018-09-13 DIAGNOSIS — I2510 Atherosclerotic heart disease of native coronary artery without angina pectoris: Secondary | ICD-10-CM | POA: Diagnosis not present

## 2018-09-13 DIAGNOSIS — E785 Hyperlipidemia, unspecified: Secondary | ICD-10-CM | POA: Diagnosis not present

## 2018-09-13 DIAGNOSIS — K219 Gastro-esophageal reflux disease without esophagitis: Secondary | ICD-10-CM | POA: Diagnosis not present

## 2018-09-13 DIAGNOSIS — J441 Chronic obstructive pulmonary disease with (acute) exacerbation: Secondary | ICD-10-CM | POA: Diagnosis not present

## 2018-09-13 DIAGNOSIS — I5033 Acute on chronic diastolic (congestive) heart failure: Secondary | ICD-10-CM | POA: Diagnosis not present

## 2018-09-13 DIAGNOSIS — K222 Esophageal obstruction: Secondary | ICD-10-CM | POA: Diagnosis not present

## 2018-09-13 DIAGNOSIS — I110 Hypertensive heart disease with heart failure: Secondary | ICD-10-CM | POA: Diagnosis not present

## 2018-09-13 DIAGNOSIS — R69 Illness, unspecified: Secondary | ICD-10-CM | POA: Diagnosis not present

## 2018-09-13 DIAGNOSIS — I482 Chronic atrial fibrillation, unspecified: Secondary | ICD-10-CM | POA: Diagnosis not present

## 2018-09-14 DIAGNOSIS — R69 Illness, unspecified: Secondary | ICD-10-CM | POA: Diagnosis not present

## 2018-09-14 DIAGNOSIS — I5042 Chronic combined systolic (congestive) and diastolic (congestive) heart failure: Secondary | ICD-10-CM | POA: Diagnosis not present

## 2018-09-14 DIAGNOSIS — E782 Mixed hyperlipidemia: Secondary | ICD-10-CM | POA: Diagnosis not present

## 2018-09-14 DIAGNOSIS — K219 Gastro-esophageal reflux disease without esophagitis: Secondary | ICD-10-CM | POA: Diagnosis not present

## 2018-09-14 DIAGNOSIS — I10 Essential (primary) hypertension: Secondary | ICD-10-CM | POA: Diagnosis not present

## 2018-09-14 DIAGNOSIS — I38 Endocarditis, valve unspecified: Secondary | ICD-10-CM | POA: Diagnosis not present

## 2018-09-14 DIAGNOSIS — E039 Hypothyroidism, unspecified: Secondary | ICD-10-CM | POA: Diagnosis not present

## 2018-09-14 DIAGNOSIS — G2581 Restless legs syndrome: Secondary | ICD-10-CM | POA: Diagnosis not present

## 2018-09-15 ENCOUNTER — Other Ambulatory Visit: Payer: Self-pay | Admitting: *Deleted

## 2018-09-15 DIAGNOSIS — I110 Hypertensive heart disease with heart failure: Secondary | ICD-10-CM | POA: Diagnosis not present

## 2018-09-15 DIAGNOSIS — J441 Chronic obstructive pulmonary disease with (acute) exacerbation: Secondary | ICD-10-CM | POA: Diagnosis not present

## 2018-09-15 DIAGNOSIS — E785 Hyperlipidemia, unspecified: Secondary | ICD-10-CM | POA: Diagnosis not present

## 2018-09-15 DIAGNOSIS — E039 Hypothyroidism, unspecified: Secondary | ICD-10-CM | POA: Diagnosis not present

## 2018-09-15 DIAGNOSIS — K222 Esophageal obstruction: Secondary | ICD-10-CM | POA: Diagnosis not present

## 2018-09-15 DIAGNOSIS — R69 Illness, unspecified: Secondary | ICD-10-CM | POA: Diagnosis not present

## 2018-09-15 DIAGNOSIS — I482 Chronic atrial fibrillation, unspecified: Secondary | ICD-10-CM | POA: Diagnosis not present

## 2018-09-15 DIAGNOSIS — K219 Gastro-esophageal reflux disease without esophagitis: Secondary | ICD-10-CM | POA: Diagnosis not present

## 2018-09-15 DIAGNOSIS — I2510 Atherosclerotic heart disease of native coronary artery without angina pectoris: Secondary | ICD-10-CM | POA: Diagnosis not present

## 2018-09-15 DIAGNOSIS — I5033 Acute on chronic diastolic (congestive) heart failure: Secondary | ICD-10-CM | POA: Diagnosis not present

## 2018-09-15 NOTE — Patient Outreach (Signed)
Telephone call to pt for transition of care week 1/   3rd attempt, no answer to telephone, left voicemail requesting return phone call.  PLAN Keep case open 3-4 business days, then close if no response from pt.  Jacqlyn Larsen [facility], [place] Coordinator [phone]

## 2018-09-17 DIAGNOSIS — J441 Chronic obstructive pulmonary disease with (acute) exacerbation: Secondary | ICD-10-CM | POA: Diagnosis not present

## 2018-09-17 DIAGNOSIS — I482 Chronic atrial fibrillation, unspecified: Secondary | ICD-10-CM | POA: Diagnosis not present

## 2018-09-17 DIAGNOSIS — I110 Hypertensive heart disease with heart failure: Secondary | ICD-10-CM | POA: Diagnosis not present

## 2018-09-17 DIAGNOSIS — I5033 Acute on chronic diastolic (congestive) heart failure: Secondary | ICD-10-CM | POA: Diagnosis not present

## 2018-09-17 DIAGNOSIS — R69 Illness, unspecified: Secondary | ICD-10-CM | POA: Diagnosis not present

## 2018-09-17 DIAGNOSIS — K219 Gastro-esophageal reflux disease without esophagitis: Secondary | ICD-10-CM | POA: Diagnosis not present

## 2018-09-17 DIAGNOSIS — I2510 Atherosclerotic heart disease of native coronary artery without angina pectoris: Secondary | ICD-10-CM | POA: Diagnosis not present

## 2018-09-17 DIAGNOSIS — K222 Esophageal obstruction: Secondary | ICD-10-CM | POA: Diagnosis not present

## 2018-09-17 DIAGNOSIS — E785 Hyperlipidemia, unspecified: Secondary | ICD-10-CM | POA: Diagnosis not present

## 2018-09-17 DIAGNOSIS — E039 Hypothyroidism, unspecified: Secondary | ICD-10-CM | POA: Diagnosis not present

## 2018-09-18 DIAGNOSIS — J441 Chronic obstructive pulmonary disease with (acute) exacerbation: Secondary | ICD-10-CM | POA: Diagnosis not present

## 2018-09-18 DIAGNOSIS — I5033 Acute on chronic diastolic (congestive) heart failure: Secondary | ICD-10-CM | POA: Diagnosis not present

## 2018-09-18 DIAGNOSIS — Z7901 Long term (current) use of anticoagulants: Secondary | ICD-10-CM | POA: Diagnosis not present

## 2018-09-20 ENCOUNTER — Other Ambulatory Visit: Payer: Self-pay | Admitting: *Deleted

## 2018-09-20 DIAGNOSIS — I5033 Acute on chronic diastolic (congestive) heart failure: Secondary | ICD-10-CM | POA: Diagnosis not present

## 2018-09-20 DIAGNOSIS — R69 Illness, unspecified: Secondary | ICD-10-CM | POA: Diagnosis not present

## 2018-09-20 DIAGNOSIS — I482 Chronic atrial fibrillation, unspecified: Secondary | ICD-10-CM | POA: Diagnosis not present

## 2018-09-20 DIAGNOSIS — J441 Chronic obstructive pulmonary disease with (acute) exacerbation: Secondary | ICD-10-CM | POA: Diagnosis not present

## 2018-09-20 DIAGNOSIS — K222 Esophageal obstruction: Secondary | ICD-10-CM | POA: Diagnosis not present

## 2018-09-20 DIAGNOSIS — I110 Hypertensive heart disease with heart failure: Secondary | ICD-10-CM | POA: Diagnosis not present

## 2018-09-20 DIAGNOSIS — I2510 Atherosclerotic heart disease of native coronary artery without angina pectoris: Secondary | ICD-10-CM | POA: Diagnosis not present

## 2018-09-20 DIAGNOSIS — E785 Hyperlipidemia, unspecified: Secondary | ICD-10-CM | POA: Diagnosis not present

## 2018-09-20 DIAGNOSIS — K219 Gastro-esophageal reflux disease without esophagitis: Secondary | ICD-10-CM | POA: Diagnosis not present

## 2018-09-20 DIAGNOSIS — E039 Hypothyroidism, unspecified: Secondary | ICD-10-CM | POA: Diagnosis not present

## 2018-09-20 NOTE — Patient Outreach (Signed)
Unable to contact pt, case closed,  RN CM mailed case closure letter to primary MD.  Case closed  Jacqlyn Larsen [facility], [place] Coordinator [phone]

## 2018-09-21 DIAGNOSIS — I5033 Acute on chronic diastolic (congestive) heart failure: Secondary | ICD-10-CM | POA: Diagnosis not present

## 2018-09-21 DIAGNOSIS — E785 Hyperlipidemia, unspecified: Secondary | ICD-10-CM | POA: Diagnosis not present

## 2018-09-21 DIAGNOSIS — R69 Illness, unspecified: Secondary | ICD-10-CM | POA: Diagnosis not present

## 2018-09-21 DIAGNOSIS — K219 Gastro-esophageal reflux disease without esophagitis: Secondary | ICD-10-CM | POA: Diagnosis not present

## 2018-09-21 DIAGNOSIS — E039 Hypothyroidism, unspecified: Secondary | ICD-10-CM | POA: Diagnosis not present

## 2018-09-21 DIAGNOSIS — K222 Esophageal obstruction: Secondary | ICD-10-CM | POA: Diagnosis not present

## 2018-09-21 DIAGNOSIS — I482 Chronic atrial fibrillation, unspecified: Secondary | ICD-10-CM | POA: Diagnosis not present

## 2018-09-21 DIAGNOSIS — I110 Hypertensive heart disease with heart failure: Secondary | ICD-10-CM | POA: Diagnosis not present

## 2018-09-21 DIAGNOSIS — I2510 Atherosclerotic heart disease of native coronary artery without angina pectoris: Secondary | ICD-10-CM | POA: Diagnosis not present

## 2018-09-21 DIAGNOSIS — J441 Chronic obstructive pulmonary disease with (acute) exacerbation: Secondary | ICD-10-CM | POA: Diagnosis not present

## 2018-09-22 DIAGNOSIS — E785 Hyperlipidemia, unspecified: Secondary | ICD-10-CM | POA: Diagnosis not present

## 2018-09-22 DIAGNOSIS — E039 Hypothyroidism, unspecified: Secondary | ICD-10-CM | POA: Diagnosis not present

## 2018-09-22 DIAGNOSIS — I482 Chronic atrial fibrillation, unspecified: Secondary | ICD-10-CM | POA: Diagnosis not present

## 2018-09-22 DIAGNOSIS — J441 Chronic obstructive pulmonary disease with (acute) exacerbation: Secondary | ICD-10-CM | POA: Diagnosis not present

## 2018-09-22 DIAGNOSIS — I2510 Atherosclerotic heart disease of native coronary artery without angina pectoris: Secondary | ICD-10-CM | POA: Diagnosis not present

## 2018-09-22 DIAGNOSIS — I5033 Acute on chronic diastolic (congestive) heart failure: Secondary | ICD-10-CM | POA: Diagnosis not present

## 2018-09-22 DIAGNOSIS — I110 Hypertensive heart disease with heart failure: Secondary | ICD-10-CM | POA: Diagnosis not present

## 2018-09-22 DIAGNOSIS — K222 Esophageal obstruction: Secondary | ICD-10-CM | POA: Diagnosis not present

## 2018-09-22 DIAGNOSIS — R69 Illness, unspecified: Secondary | ICD-10-CM | POA: Diagnosis not present

## 2018-09-22 DIAGNOSIS — K219 Gastro-esophageal reflux disease without esophagitis: Secondary | ICD-10-CM | POA: Diagnosis not present

## 2018-09-23 DIAGNOSIS — J441 Chronic obstructive pulmonary disease with (acute) exacerbation: Secondary | ICD-10-CM | POA: Diagnosis not present

## 2018-09-23 DIAGNOSIS — K219 Gastro-esophageal reflux disease without esophagitis: Secondary | ICD-10-CM | POA: Diagnosis not present

## 2018-09-23 DIAGNOSIS — I110 Hypertensive heart disease with heart failure: Secondary | ICD-10-CM | POA: Diagnosis not present

## 2018-09-23 DIAGNOSIS — E785 Hyperlipidemia, unspecified: Secondary | ICD-10-CM | POA: Diagnosis not present

## 2018-09-23 DIAGNOSIS — R69 Illness, unspecified: Secondary | ICD-10-CM | POA: Diagnosis not present

## 2018-09-23 DIAGNOSIS — E039 Hypothyroidism, unspecified: Secondary | ICD-10-CM | POA: Diagnosis not present

## 2018-09-23 DIAGNOSIS — K222 Esophageal obstruction: Secondary | ICD-10-CM | POA: Diagnosis not present

## 2018-09-23 DIAGNOSIS — I5033 Acute on chronic diastolic (congestive) heart failure: Secondary | ICD-10-CM | POA: Diagnosis not present

## 2018-09-23 DIAGNOSIS — I482 Chronic atrial fibrillation, unspecified: Secondary | ICD-10-CM | POA: Diagnosis not present

## 2018-09-23 DIAGNOSIS — I2510 Atherosclerotic heart disease of native coronary artery without angina pectoris: Secondary | ICD-10-CM | POA: Diagnosis not present

## 2018-09-24 DIAGNOSIS — I110 Hypertensive heart disease with heart failure: Secondary | ICD-10-CM | POA: Diagnosis not present

## 2018-09-24 DIAGNOSIS — K222 Esophageal obstruction: Secondary | ICD-10-CM | POA: Diagnosis not present

## 2018-09-24 DIAGNOSIS — K219 Gastro-esophageal reflux disease without esophagitis: Secondary | ICD-10-CM | POA: Diagnosis not present

## 2018-09-24 DIAGNOSIS — J441 Chronic obstructive pulmonary disease with (acute) exacerbation: Secondary | ICD-10-CM | POA: Diagnosis not present

## 2018-09-24 DIAGNOSIS — E785 Hyperlipidemia, unspecified: Secondary | ICD-10-CM | POA: Diagnosis not present

## 2018-09-24 DIAGNOSIS — R69 Illness, unspecified: Secondary | ICD-10-CM | POA: Diagnosis not present

## 2018-09-24 DIAGNOSIS — E039 Hypothyroidism, unspecified: Secondary | ICD-10-CM | POA: Diagnosis not present

## 2018-09-24 DIAGNOSIS — I482 Chronic atrial fibrillation, unspecified: Secondary | ICD-10-CM | POA: Diagnosis not present

## 2018-09-24 DIAGNOSIS — I5033 Acute on chronic diastolic (congestive) heart failure: Secondary | ICD-10-CM | POA: Diagnosis not present

## 2018-09-24 DIAGNOSIS — I2510 Atherosclerotic heart disease of native coronary artery without angina pectoris: Secondary | ICD-10-CM | POA: Diagnosis not present

## 2018-09-27 DIAGNOSIS — R69 Illness, unspecified: Secondary | ICD-10-CM | POA: Diagnosis not present

## 2018-09-27 DIAGNOSIS — E785 Hyperlipidemia, unspecified: Secondary | ICD-10-CM | POA: Diagnosis not present

## 2018-09-27 DIAGNOSIS — J441 Chronic obstructive pulmonary disease with (acute) exacerbation: Secondary | ICD-10-CM | POA: Diagnosis not present

## 2018-09-27 DIAGNOSIS — I482 Chronic atrial fibrillation, unspecified: Secondary | ICD-10-CM | POA: Diagnosis not present

## 2018-09-27 DIAGNOSIS — I5033 Acute on chronic diastolic (congestive) heart failure: Secondary | ICD-10-CM | POA: Diagnosis not present

## 2018-09-27 DIAGNOSIS — I2510 Atherosclerotic heart disease of native coronary artery without angina pectoris: Secondary | ICD-10-CM | POA: Diagnosis not present

## 2018-09-27 DIAGNOSIS — I110 Hypertensive heart disease with heart failure: Secondary | ICD-10-CM | POA: Diagnosis not present

## 2018-09-27 DIAGNOSIS — K222 Esophageal obstruction: Secondary | ICD-10-CM | POA: Diagnosis not present

## 2018-09-27 DIAGNOSIS — E039 Hypothyroidism, unspecified: Secondary | ICD-10-CM | POA: Diagnosis not present

## 2018-09-27 DIAGNOSIS — K219 Gastro-esophageal reflux disease without esophagitis: Secondary | ICD-10-CM | POA: Diagnosis not present

## 2018-09-28 DIAGNOSIS — I110 Hypertensive heart disease with heart failure: Secondary | ICD-10-CM | POA: Diagnosis not present

## 2018-09-28 DIAGNOSIS — I482 Chronic atrial fibrillation, unspecified: Secondary | ICD-10-CM | POA: Diagnosis not present

## 2018-09-28 DIAGNOSIS — K219 Gastro-esophageal reflux disease without esophagitis: Secondary | ICD-10-CM | POA: Diagnosis not present

## 2018-09-28 DIAGNOSIS — K222 Esophageal obstruction: Secondary | ICD-10-CM | POA: Diagnosis not present

## 2018-09-28 DIAGNOSIS — I2510 Atherosclerotic heart disease of native coronary artery without angina pectoris: Secondary | ICD-10-CM | POA: Diagnosis not present

## 2018-09-28 DIAGNOSIS — E039 Hypothyroidism, unspecified: Secondary | ICD-10-CM | POA: Diagnosis not present

## 2018-09-28 DIAGNOSIS — E785 Hyperlipidemia, unspecified: Secondary | ICD-10-CM | POA: Diagnosis not present

## 2018-09-28 DIAGNOSIS — R69 Illness, unspecified: Secondary | ICD-10-CM | POA: Diagnosis not present

## 2018-09-28 DIAGNOSIS — J441 Chronic obstructive pulmonary disease with (acute) exacerbation: Secondary | ICD-10-CM | POA: Diagnosis not present

## 2018-09-28 DIAGNOSIS — I5033 Acute on chronic diastolic (congestive) heart failure: Secondary | ICD-10-CM | POA: Diagnosis not present

## 2018-09-29 DIAGNOSIS — I2510 Atherosclerotic heart disease of native coronary artery without angina pectoris: Secondary | ICD-10-CM | POA: Diagnosis not present

## 2018-09-29 DIAGNOSIS — E785 Hyperlipidemia, unspecified: Secondary | ICD-10-CM | POA: Diagnosis not present

## 2018-09-29 DIAGNOSIS — R69 Illness, unspecified: Secondary | ICD-10-CM | POA: Diagnosis not present

## 2018-09-29 DIAGNOSIS — I110 Hypertensive heart disease with heart failure: Secondary | ICD-10-CM | POA: Diagnosis not present

## 2018-09-29 DIAGNOSIS — K222 Esophageal obstruction: Secondary | ICD-10-CM | POA: Diagnosis not present

## 2018-09-29 DIAGNOSIS — E039 Hypothyroidism, unspecified: Secondary | ICD-10-CM | POA: Diagnosis not present

## 2018-09-29 DIAGNOSIS — I482 Chronic atrial fibrillation, unspecified: Secondary | ICD-10-CM | POA: Diagnosis not present

## 2018-09-29 DIAGNOSIS — I5033 Acute on chronic diastolic (congestive) heart failure: Secondary | ICD-10-CM | POA: Diagnosis not present

## 2018-09-29 DIAGNOSIS — K219 Gastro-esophageal reflux disease without esophagitis: Secondary | ICD-10-CM | POA: Diagnosis not present

## 2018-09-29 DIAGNOSIS — J441 Chronic obstructive pulmonary disease with (acute) exacerbation: Secondary | ICD-10-CM | POA: Diagnosis not present

## 2018-09-30 DIAGNOSIS — E039 Hypothyroidism, unspecified: Secondary | ICD-10-CM | POA: Diagnosis not present

## 2018-09-30 DIAGNOSIS — I5033 Acute on chronic diastolic (congestive) heart failure: Secondary | ICD-10-CM | POA: Diagnosis not present

## 2018-09-30 DIAGNOSIS — I482 Chronic atrial fibrillation, unspecified: Secondary | ICD-10-CM | POA: Diagnosis not present

## 2018-09-30 DIAGNOSIS — R69 Illness, unspecified: Secondary | ICD-10-CM | POA: Diagnosis not present

## 2018-09-30 DIAGNOSIS — K219 Gastro-esophageal reflux disease without esophagitis: Secondary | ICD-10-CM | POA: Diagnosis not present

## 2018-09-30 DIAGNOSIS — I2510 Atherosclerotic heart disease of native coronary artery without angina pectoris: Secondary | ICD-10-CM | POA: Diagnosis not present

## 2018-09-30 DIAGNOSIS — I110 Hypertensive heart disease with heart failure: Secondary | ICD-10-CM | POA: Diagnosis not present

## 2018-09-30 DIAGNOSIS — K222 Esophageal obstruction: Secondary | ICD-10-CM | POA: Diagnosis not present

## 2018-09-30 DIAGNOSIS — J441 Chronic obstructive pulmonary disease with (acute) exacerbation: Secondary | ICD-10-CM | POA: Diagnosis not present

## 2018-09-30 DIAGNOSIS — E785 Hyperlipidemia, unspecified: Secondary | ICD-10-CM | POA: Diagnosis not present

## 2018-10-04 DIAGNOSIS — I5033 Acute on chronic diastolic (congestive) heart failure: Secondary | ICD-10-CM | POA: Diagnosis not present

## 2018-10-04 DIAGNOSIS — I38 Endocarditis, valve unspecified: Secondary | ICD-10-CM | POA: Diagnosis not present

## 2018-10-04 DIAGNOSIS — E782 Mixed hyperlipidemia: Secondary | ICD-10-CM | POA: Diagnosis not present

## 2018-10-04 DIAGNOSIS — G2581 Restless legs syndrome: Secondary | ICD-10-CM | POA: Diagnosis not present

## 2018-10-04 DIAGNOSIS — J441 Chronic obstructive pulmonary disease with (acute) exacerbation: Secondary | ICD-10-CM | POA: Diagnosis not present

## 2018-10-04 DIAGNOSIS — K219 Gastro-esophageal reflux disease without esophagitis: Secondary | ICD-10-CM | POA: Diagnosis not present

## 2018-10-04 DIAGNOSIS — I130 Hypertensive heart and chronic kidney disease with heart failure and stage 1 through stage 4 chronic kidney disease, or unspecified chronic kidney disease: Secondary | ICD-10-CM | POA: Diagnosis not present

## 2018-10-04 DIAGNOSIS — Z09 Encounter for follow-up examination after completed treatment for conditions other than malignant neoplasm: Secondary | ICD-10-CM | POA: Diagnosis not present

## 2018-10-04 DIAGNOSIS — I5042 Chronic combined systolic (congestive) and diastolic (congestive) heart failure: Secondary | ICD-10-CM | POA: Diagnosis not present

## 2018-10-04 DIAGNOSIS — I2510 Atherosclerotic heart disease of native coronary artery without angina pectoris: Secondary | ICD-10-CM | POA: Diagnosis not present

## 2018-10-04 DIAGNOSIS — I10 Essential (primary) hypertension: Secondary | ICD-10-CM | POA: Diagnosis not present

## 2018-10-04 DIAGNOSIS — N183 Chronic kidney disease, stage 3 (moderate): Secondary | ICD-10-CM | POA: Diagnosis not present

## 2018-10-04 DIAGNOSIS — E785 Hyperlipidemia, unspecified: Secondary | ICD-10-CM | POA: Diagnosis not present

## 2018-10-04 DIAGNOSIS — K222 Esophageal obstruction: Secondary | ICD-10-CM | POA: Diagnosis not present

## 2018-10-04 DIAGNOSIS — E039 Hypothyroidism, unspecified: Secondary | ICD-10-CM | POA: Diagnosis not present

## 2018-10-04 DIAGNOSIS — J9621 Acute and chronic respiratory failure with hypoxia: Secondary | ICD-10-CM | POA: Diagnosis not present

## 2018-10-04 DIAGNOSIS — R69 Illness, unspecified: Secondary | ICD-10-CM | POA: Diagnosis not present

## 2018-10-06 DIAGNOSIS — E782 Mixed hyperlipidemia: Secondary | ICD-10-CM | POA: Diagnosis not present

## 2018-10-06 DIAGNOSIS — M9909 Segmental and somatic dysfunction of abdomen and other regions: Secondary | ICD-10-CM | POA: Diagnosis not present

## 2018-10-06 DIAGNOSIS — I38 Endocarditis, valve unspecified: Secondary | ICD-10-CM | POA: Diagnosis not present

## 2018-10-06 DIAGNOSIS — E039 Hypothyroidism, unspecified: Secondary | ICD-10-CM | POA: Diagnosis not present

## 2018-10-06 DIAGNOSIS — M9903 Segmental and somatic dysfunction of lumbar region: Secondary | ICD-10-CM | POA: Diagnosis not present

## 2018-10-06 DIAGNOSIS — K219 Gastro-esophageal reflux disease without esophagitis: Secondary | ICD-10-CM | POA: Diagnosis not present

## 2018-10-07 NOTE — Progress Notes (Signed)
Cardiology Office Note  Date: 10/08/2018   ID: [name], DOB 1951-09-03, MRN [id number]  PCP: Octavio Graves, DO  Primary Cardiologist: Rozann Lesches, MD   Chief Complaint  Patient presents with  . Diastolic heart failure    History of Present Illness: [name] is a 67 y.o. female last seen in October by Ms. Bonnell Public PA-C.  I reviewed interval records.  She was hospitalized in November with acute on chronic hypoxic respiratory failure in the setting of COPD exacerbation as well as acute on chronic diastolic heart failure.  She had a significant diuresis of 11 L during hospital stay and with supportive measures for her COPD.  She was discharged on Lasix 120 mg twice daily with as needed metolazone.  She presents today for follow-up, states that she feels better.  Her son has been arranging medications for her daily.  She states that she is following daily weights but has not used metolazone recently.  Compared to October her weight is up about 5 pounds.  She was not wearing supplemental oxygen today.  Past Medical History:  Diagnosis Date  . Acute on chronic diastolic (congestive) heart failure ([place])   . Anxiety disorder   . Aortic stenosis    Status post St. Jude mechanical AVR 2007  . Asthma   . Atrial fibrillation ([place])   . Carcinoid tumor of colon 2007  . Coronary atherosclerosis of native coronary artery    Status post CABG 2007  . GERD (gastroesophageal reflux disease)   . History of colonoscopy 2003   Dr. Gala Romney - normal  . Hyperlipidemia   . Macromastia   . Pedal edema    Bilateral, chronic  . Peptic stricture of esophagus 10/04/2010   GE junction on last EGD by Dr. Gala Romney, benign biopsies  . RLS (restless legs syndrome)   . Schatzki's ring     Past Surgical History:  Procedure Laterality Date  . ABDOMINAL HYSTERECTOMY    . AORTIC VALVE REPLACEMENT  2007   #25 mm St. Jude mechanical prosthesis with Hemashield tube graft repair of ascending aneurysm   . APPENDECTOMY  2007  . BACK SURGERY     lumbar 4 and 5   . BIOPSY  02/05/2012   RMR:Two tongues of salmon-colored epithelium distal esophagus, very short-segment Barrett's s/p bx/Small hiatal hernia, otherwise normal stomach, D1, D2. Status post esophageal dilation. Biopsy showed GERD.  . Breast cyst removed     bilateral  . BREAST REDUCTION SURGERY    . CESAREAN SECTION    . 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 COLONOSCOPY  02/05/2012   GTX:MIWOEH rectum, sigmoid diverticulosis,descending colon polyp , tubular adenoma  . CORONARY ARTERY BYPASS GRAFT     06/2006 - RIMA to RCA, SVG to RCA  . ESOPHAGOGASTRODUODENOSCOPY  10/03/2010   Dr. Gala Romney- schatzki's ring, shoft peptic stricture at GE junction.  . ESOPHAGOGASTRODUODENOSCOPY (EGD) WITH PROPOFOL N/A 02/08/2018   Procedure: ESOPHAGOGASTRODUODENOSCOPY (EGD) WITH PROPOFOL;  Surgeon: Daneil Dolin, MD;  [facility];  Service: Endoscopy;  Laterality: N/A;  8:15am  . FOOT SURGERY     bilateral bunionectomy  . HERNIA REPAIR     with mesh  . LAPAROTOMY  2007   small bowel resection secondary to small bowel obstruction  . MALONEY DILATION  02/05/2012   Procedure: Venia Minks DILATION;  Surgeon: Daneil Dolin, MD;  Location: [facility];  Service: Endoscopy;  Laterality: N/A;  23mm   .  MALONEY DILATION N/A 02/08/2018   Procedure: Venia Minks DILATION;  Surgeon: Daneil Dolin, MD;  Location: [facility];  Service: Endoscopy;  Laterality: N/A;  . Teeth removal      Current Outpatient Medications  Medication Sig Dispense Refill  . allopurinol (ZYLOPRIM) 100 MG tablet Take 100 mg by mouth daily.    Marland Kitchen ALPRAZolam (XANAX) 0.25 MG tablet Take 1 tablet (0.25 mg total) by mouth 2 (two) times daily as needed for anxiety. (Patient taking differently: Take 1 mg by mouth 2 (two) times daily as needed for anxiety. ) 10 tablet 0  . bisoprolol (ZEBETA) 5 MG tablet Take 1 tablet (5 mg total) by mouth daily. 30 tablet 1  .  budesonide-formoterol (SYMBICORT) 160-4.5 MCG/ACT inhaler Inhale 2 puffs into the lungs 2 (two) times daily. 1 Inhaler 3  . calcium carbonate (OS-CAL) 600 MG TABS tablet Take 600 mg by mouth 2 (two) times daily.    . cetirizine (ZYRTEC) 10 MG tablet Take 10 mg by mouth daily.      . CRESTOR 10 MG tablet Take 10 mg by mouth at bedtime.     . cyclobenzaprine (FLEXERIL) 10 MG tablet Take 10 mg by mouth daily as needed for muscle spasms.    Marland Kitchen diltiazem (CARDIZEM CD) 120 MG 24 hr capsule Take 1 capsule (120 mg total) by mouth daily. 30 capsule 2  . furosemide (LASIX) 80 MG tablet Take 1.5 tablets (120 mg total) by mouth 2 (two) times daily. 90 tablet 1  . HYDROcodone-acetaminophen (NORCO) 10-325 MG tablet Take 1 tablet by mouth daily as needed for moderate pain. Normally takes one tablet at bedtime for back pain    . Ipratropium-Albuterol (COMBIVENT) 20-100 MCG/ACT AERS respimat Inhale 1-2 puffs into the lungs every 6 (six) hours as needed for wheezing or shortness of breath. 1 Inhaler 3  . levothyroxine (SYNTHROID, LEVOTHROID) 50 MCG tablet Take 50 mcg by mouth daily before breakfast.     . magnesium oxide (MAG-OX) 400 MG tablet Take 400 mg by mouth daily.    . metolazone (ZAROXOLYN) 2.5 MG tablet Take one tablet on Saturday and Sunday then daily as needed if your weight go up more than 2-3 pounds. 30 tablet 0  . Multiple Vitamin (MULTIVITAMIN WITH MINERALS) TABS tablet Take 1 tablet by mouth daily.    Marland Kitchen omeprazole (PRILOSEC) 20 MG capsule Take 1 capsule (20 mg total) by mouth 2 (two) times daily before a meal. 60 capsule 5  . OXYGEN Inhale 2 L into the lungs daily.     . potassium chloride (KLOR-CON) 20 MEQ packet Take 20 mEq by mouth 2 (two) times daily.     Marland Kitchen rOPINIRole (REQUIP) 3 MG tablet Take 3 mg by mouth 2 (two) times daily as needed (for Restless leg syndrome).     . temazepam (RESTORIL) 30 MG capsule Take 30 mg by mouth at bedtime as needed for sleep.     Marland Kitchen warfarin (COUMADIN) 2 MG tablet  Take 1.5-2 tablets (3-4 mg total) by mouth See admin instructions. 4mg  on Tuesday and 3 mg daily the rest of the week.     No current facility-administered medications for this visit.    Allergies:  Penicillins   Social History: The patient  reports that she quit smoking about 29 years ago. Her smoking use included cigarettes. She has a 20.00 pack-year smoking history. She has never used smokeless tobacco. She reports that she does not drink alcohol or use drugs.   ROS:  Please  see the history of present illness. Otherwise, complete review of systems is positive for hearing and memory loss.  All other systems are reviewed and negative.   Physical Exam: VS:  BP 106/68   Pulse 83   Ht 5\' 8"  (1.727 m)   Wt 178 lb 6.4 oz (80.9 kg)   SpO2 97%   BMI 27.13 kg/m , BMI Body mass index is 27.13 kg/m.  Wt Readings from Last 3 Encounters:  10/08/18 178 lb 6.4 oz (80.9 kg)  09/02/18 192 lb 7.4 oz (87.3 kg)  08/09/18 173 lb 9.6 oz (78.7 kg)    General: Overweight woman, no distress. HEENT: Conjunctiva and lids normal, oropharynx clear. Neck: Supple, no elevated JVP or carotid bruits, no thyromegaly. Lungs: Decreased breath sounds without wheezing, nonlabored breathing at rest. Cardiac: Irregularly irregular, no S3, crisp mechanical prosthetic click in S2 with soft systolic murmur, no pericardial rub. Abdomen: Soft, nontender, bowel sounds present. Extremities: 1-2+ lower leg edema and venous stasis, distal pulses 1+. Skin: Warm and dry. Musculoskeletal: No kyphosis. Neuropsychiatric: Alert and oriented x3, affect grossly appropriate.  ECG: I personally reviewed the tracing from 09/01/2018 which showed atrial fibrillation with controlled ventricular response, nonspecific ST T changes.  Recent Labwork: 08/01/2018: Magnesium 2.1 09/01/2018: B Natriuretic Peptide 958.0 09/02/2018: ALT 13; AST 23 09/05/2018: BUN 35; Creatinine, Ser 1.11; Hemoglobin 9.6; Platelets 349; Potassium 3.8; Sodium 139    Other Studies Reviewed Today:  Echocardiogram 07/27/2018: Study Conclusions  - Left ventricle: The cavity size was normal. Wall thickness was   increased in a pattern of mild LVH. Systolic function was normal.   The estimated ejection fraction was in the range of 60% to 65%.   Wall motion was normal; there were no regional wall motion   abnormalities. The study was not technically sufficient to allow   evaluation of LV diastolic dysfunction due to atrial   fibrillation. - Aortic valve: St. Jude mechanical prosthesis in aortic position.   There was no significant regurgitation. Mean gradient (S): 6 mm   Hg. Peak gradient (S): 11 mm Hg. Normal prosthetic function based   on gradients. - Mitral valve: Moderately calcified annulus. There was mild   regurgitation directed eccentrically. - Left atrium: The atrium was severely dilated. - Right ventricle: The cavity size was moderately dilated. Systolic   function was mildly reduced. - Right atrium: The atrium was at the upper limits of normal in   size. Central venous pressure (est): 15 mm Hg. - Atrial septum: No defect or patent foramen ovale was identified. - Tricuspid valve: There was mild-moderate regurgitation. - Pulmonary arteries: PA peak pressure: 39 mm Hg (S). - Pericardium, extracardiac: There was no pericardial effusion.  Chest x-ray 09/01/2018: FINDINGS: Unchanged moderate cardiomegaly with findings of prior median sternotomy and valve replacement. There is mild interstitial pulmonary edema. Small right pleural effusion with associated atelectasis.  IMPRESSION: Cardiomegaly with mild interstitial pulmonary edema and small right pleural effusion.  Assessment and Plan:  1.  Chronic diastolic heart failure.  She has been on Lasix 120 mg twice daily, significantly uptitrated since our last encounter.  I reviewed recent echocardiogram results.  We will obtain a BMET to reassess renal function.  She has metolazone to take  as needed, I asked her to take 1 dose each day over the weekend and then titrate based on weight gain of 2 to 3 pounds in 24 hours.  2.  Permanent atrial fibrillation.  She continues on Coumadin and has adequate heart rate control  on combination of beta-blocker and calcium channel blocker.  3.  Aortic valve disease status post St. Jude mechanical AVR with normal function by recent follow-up echocardiogram.  Continue Coumadin.  4.  CAD status post CABG in 2007.  She denies any significant angina symptoms.  Not on aspirin in light of concurrent use of Coumadin.  She continues on statin therapy.  5.  Chronic hypoxic respiratory failure with known COPD.  She is supposed to be using oxygen regularly, has seen Dr. Luan Pulling.  We discussed this today.  Current medicines were reviewed with the patient today.   Orders Placed This Encounter  Procedures  . Basic metabolic panel    Disposition: Follow-up in 2 months.  Signed, Satira Sark, MD, [facility] 10/08/2018 10:43 AM    [place] [facility] [place], [place], [place] [address] Phone: [phone]; Fax: [phone]

## 2018-10-08 ENCOUNTER — Encounter: Payer: Self-pay | Admitting: Cardiology

## 2018-10-08 ENCOUNTER — Ambulatory Visit: Payer: Medicare HMO | Admitting: Cardiology

## 2018-10-08 ENCOUNTER — Other Ambulatory Visit: Payer: Self-pay | Admitting: *Deleted

## 2018-10-08 VITALS — BP 106/68 | HR 83 | Ht 68.0 in | Wt 178.4 lb

## 2018-10-08 DIAGNOSIS — I25708 Atherosclerosis of coronary artery bypass graft(s), unspecified, with other forms of angina pectoris: Secondary | ICD-10-CM | POA: Diagnosis not present

## 2018-10-08 DIAGNOSIS — I4821 Permanent atrial fibrillation: Secondary | ICD-10-CM

## 2018-10-08 DIAGNOSIS — Z952 Presence of prosthetic heart valve: Secondary | ICD-10-CM

## 2018-10-08 DIAGNOSIS — I5032 Chronic diastolic (congestive) heart failure: Secondary | ICD-10-CM | POA: Diagnosis not present

## 2018-10-08 MED ORDER — METOLAZONE 2.5 MG PO TABS: ORAL_TABLET | ORAL | 0 refills | Status: DC

## 2018-10-08 NOTE — Patient Instructions (Addendum)
Medication Instructions:   Your physician has recommended you make the following change in your medication:   Take one metolazone on Saturday and Sunday then take one daily as needed for weight gain of 2-3 lbs  Continue all other medications the same.  Labwork:  Your physician recommends that you return for lab work in: TODAY to check your BMET.  Testing/Procedures:  NONE  Follow-Up:  Your physician recommends that you schedule a follow-up appointment in: 2 months.  Any Other Special Instructions Will Be Listed Below (If Applicable). Your physician recommends that you weigh, daily, at the same time every day, and in the same amount of clothing. Please record your daily weights on the handout provided and bring it to your next appointment.   If you need a refill on your cardiac medications before your next appointment, please call your pharmacy.

## 2018-10-10 DIAGNOSIS — I5033 Acute on chronic diastolic (congestive) heart failure: Secondary | ICD-10-CM | POA: Diagnosis not present

## 2018-10-10 DIAGNOSIS — E039 Hypothyroidism, unspecified: Secondary | ICD-10-CM | POA: Diagnosis not present

## 2018-10-10 DIAGNOSIS — R69 Illness, unspecified: Secondary | ICD-10-CM | POA: Diagnosis not present

## 2018-10-10 DIAGNOSIS — E785 Hyperlipidemia, unspecified: Secondary | ICD-10-CM | POA: Diagnosis not present

## 2018-10-10 DIAGNOSIS — J441 Chronic obstructive pulmonary disease with (acute) exacerbation: Secondary | ICD-10-CM | POA: Diagnosis not present

## 2018-10-10 DIAGNOSIS — K222 Esophageal obstruction: Secondary | ICD-10-CM | POA: Diagnosis not present

## 2018-10-10 DIAGNOSIS — N183 Chronic kidney disease, stage 3 (moderate): Secondary | ICD-10-CM | POA: Diagnosis not present

## 2018-10-10 DIAGNOSIS — J9621 Acute and chronic respiratory failure with hypoxia: Secondary | ICD-10-CM | POA: Diagnosis not present

## 2018-10-10 DIAGNOSIS — I130 Hypertensive heart and chronic kidney disease with heart failure and stage 1 through stage 4 chronic kidney disease, or unspecified chronic kidney disease: Secondary | ICD-10-CM | POA: Diagnosis not present

## 2018-10-10 DIAGNOSIS — I2510 Atherosclerotic heart disease of native coronary artery without angina pectoris: Secondary | ICD-10-CM | POA: Diagnosis not present

## 2018-10-11 ENCOUNTER — Encounter: Payer: Self-pay | Admitting: *Deleted

## 2018-10-11 ENCOUNTER — Telehealth: Payer: Self-pay | Admitting: *Deleted

## 2018-10-11 MED ORDER — FUROSEMIDE 80 MG PO TABS: 120.0000 mg | ORAL_TABLET | ORAL | 1 refills | Status: DC

## 2018-10-11 NOTE — Telephone Encounter (Signed)
-----   Message from Amy Sark, MD sent at 10/11/2018 12:12 PM EST ----- Results reviewed.  Creatinine has gone up to 1.6 although potassium normal.  Suggest we try and change her Lasix to 120 mg in the morning and 80 mg in the afternoon. A copy of this test should be forwarded to Octavio Graves, DO.

## 2018-10-11 NOTE — Telephone Encounter (Signed)
Patient informed and verbalized understanding of plan. 

## 2018-10-18 DIAGNOSIS — J40 Bronchitis, not specified as acute or chronic: Secondary | ICD-10-CM | POA: Diagnosis not present

## 2018-10-19 DIAGNOSIS — J9621 Acute and chronic respiratory failure with hypoxia: Secondary | ICD-10-CM | POA: Diagnosis not present

## 2018-10-19 DIAGNOSIS — I130 Hypertensive heart and chronic kidney disease with heart failure and stage 1 through stage 4 chronic kidney disease, or unspecified chronic kidney disease: Secondary | ICD-10-CM | POA: Diagnosis not present

## 2018-10-19 DIAGNOSIS — E785 Hyperlipidemia, unspecified: Secondary | ICD-10-CM | POA: Diagnosis not present

## 2018-10-19 DIAGNOSIS — I5033 Acute on chronic diastolic (congestive) heart failure: Secondary | ICD-10-CM | POA: Diagnosis not present

## 2018-10-19 DIAGNOSIS — Z7901 Long term (current) use of anticoagulants: Secondary | ICD-10-CM | POA: Diagnosis not present

## 2018-10-19 DIAGNOSIS — N183 Chronic kidney disease, stage 3 (moderate): Secondary | ICD-10-CM | POA: Diagnosis not present

## 2018-10-19 DIAGNOSIS — I2510 Atherosclerotic heart disease of native coronary artery without angina pectoris: Secondary | ICD-10-CM | POA: Diagnosis not present

## 2018-10-19 DIAGNOSIS — K222 Esophageal obstruction: Secondary | ICD-10-CM | POA: Diagnosis not present

## 2018-10-19 DIAGNOSIS — J441 Chronic obstructive pulmonary disease with (acute) exacerbation: Secondary | ICD-10-CM | POA: Diagnosis not present

## 2018-10-19 DIAGNOSIS — R69 Illness, unspecified: Secondary | ICD-10-CM | POA: Diagnosis not present

## 2018-10-19 DIAGNOSIS — E039 Hypothyroidism, unspecified: Secondary | ICD-10-CM | POA: Diagnosis not present

## 2018-10-26 DIAGNOSIS — E785 Hyperlipidemia, unspecified: Secondary | ICD-10-CM | POA: Diagnosis not present

## 2018-10-26 DIAGNOSIS — J9621 Acute and chronic respiratory failure with hypoxia: Secondary | ICD-10-CM | POA: Diagnosis not present

## 2018-10-26 DIAGNOSIS — I5033 Acute on chronic diastolic (congestive) heart failure: Secondary | ICD-10-CM | POA: Diagnosis not present

## 2018-10-26 DIAGNOSIS — N183 Chronic kidney disease, stage 3 (moderate): Secondary | ICD-10-CM | POA: Diagnosis not present

## 2018-10-26 DIAGNOSIS — K222 Esophageal obstruction: Secondary | ICD-10-CM | POA: Diagnosis not present

## 2018-10-26 DIAGNOSIS — R69 Illness, unspecified: Secondary | ICD-10-CM | POA: Diagnosis not present

## 2018-10-26 DIAGNOSIS — E039 Hypothyroidism, unspecified: Secondary | ICD-10-CM | POA: Diagnosis not present

## 2018-10-26 DIAGNOSIS — I130 Hypertensive heart and chronic kidney disease with heart failure and stage 1 through stage 4 chronic kidney disease, or unspecified chronic kidney disease: Secondary | ICD-10-CM | POA: Diagnosis not present

## 2018-10-26 DIAGNOSIS — I2510 Atherosclerotic heart disease of native coronary artery without angina pectoris: Secondary | ICD-10-CM | POA: Diagnosis not present

## 2018-10-26 DIAGNOSIS — J441 Chronic obstructive pulmonary disease with (acute) exacerbation: Secondary | ICD-10-CM | POA: Diagnosis not present

## 2018-11-01 DIAGNOSIS — J189 Pneumonia, unspecified organism: Secondary | ICD-10-CM | POA: Diagnosis not present

## 2018-11-02 ENCOUNTER — Ambulatory Visit: Payer: Medicare HMO | Admitting: Pharmacist

## 2018-11-02 DIAGNOSIS — Z5181 Encounter for therapeutic drug level monitoring: Secondary | ICD-10-CM

## 2018-11-02 DIAGNOSIS — I482 Chronic atrial fibrillation, unspecified: Secondary | ICD-10-CM

## 2018-11-02 DIAGNOSIS — I4891 Unspecified atrial fibrillation: Secondary | ICD-10-CM

## 2018-11-02 DIAGNOSIS — Z952 Presence of prosthetic heart valve: Secondary | ICD-10-CM | POA: Diagnosis not present

## 2018-11-02 LAB — POCT INR: INR: 2.7 (ref 2.0–3.0)

## 2018-11-02 NOTE — Patient Instructions (Signed)
Description   Continue coumadin 1 1/2 tablet daily except 2 tablets on Tuesdays Recheck in 3 weeks.

## 2018-11-03 ENCOUNTER — Ambulatory Visit: Payer: Medicare HMO | Admitting: Cardiology

## 2018-11-03 IMAGING — US US RENAL
1 series · 14 of 25 positions shown · non-contrast
Comparison: None.

CLINICAL DATA: Renal failure.

EXAM:
RENAL / URINARY TRACT ULTRASOUND COMPLETE

[Series 1: us renal · 0.28mm/px · 14 of 56 slices shown]
[im 1/56]
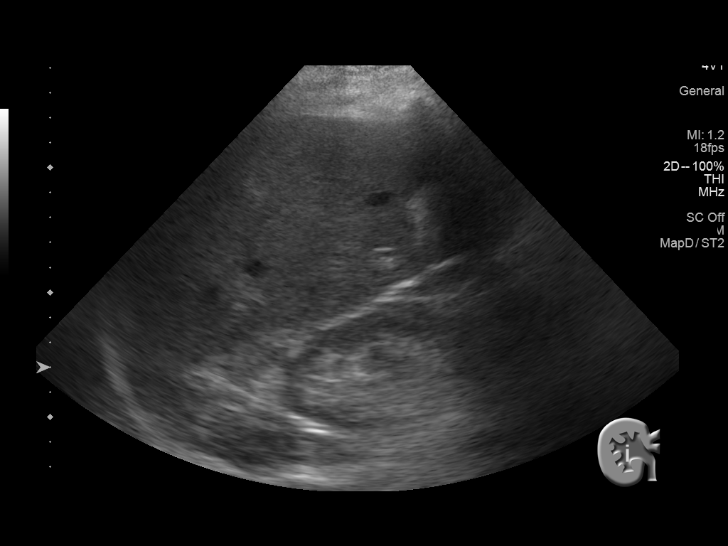
[im 5/56]
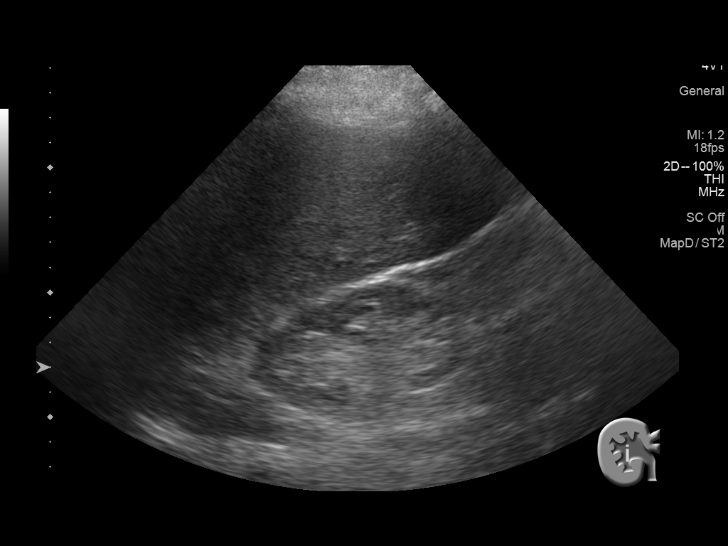
[im 10/56]
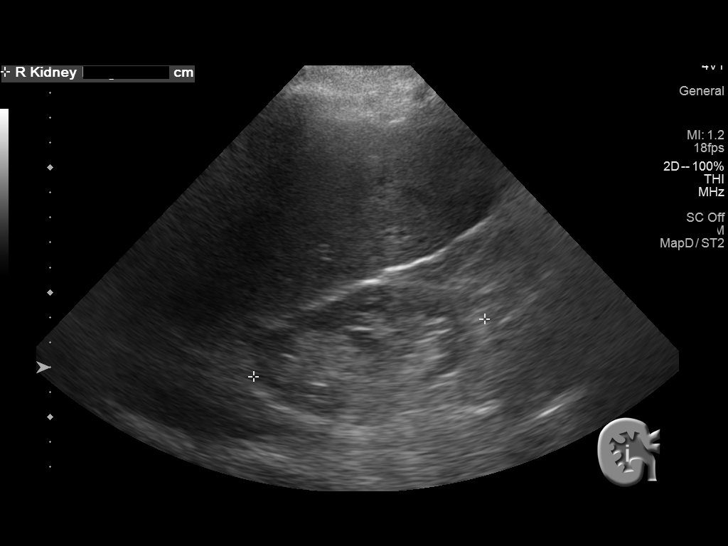
[im 14/56]
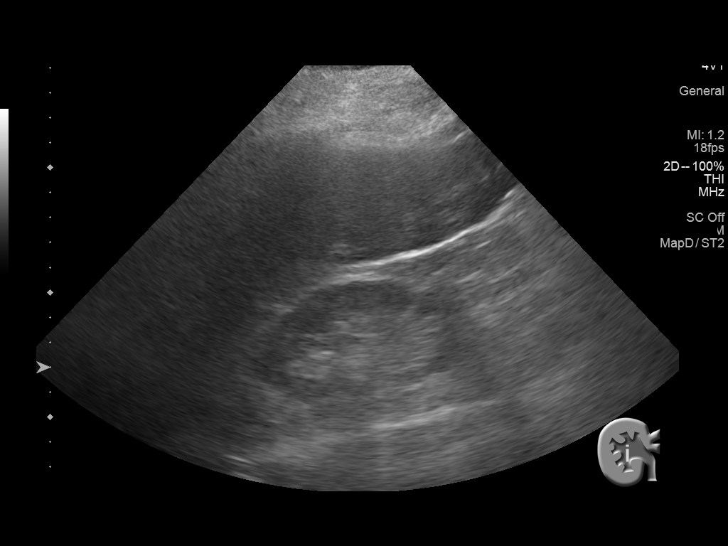
[im 19/56]
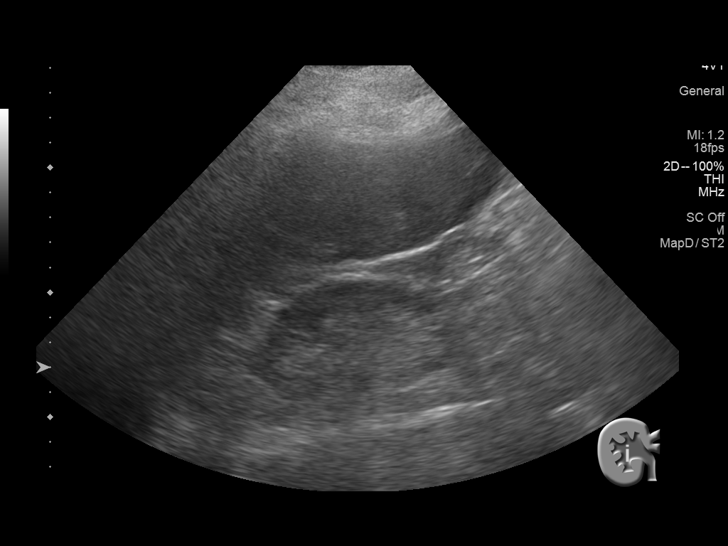
[im 21/56]
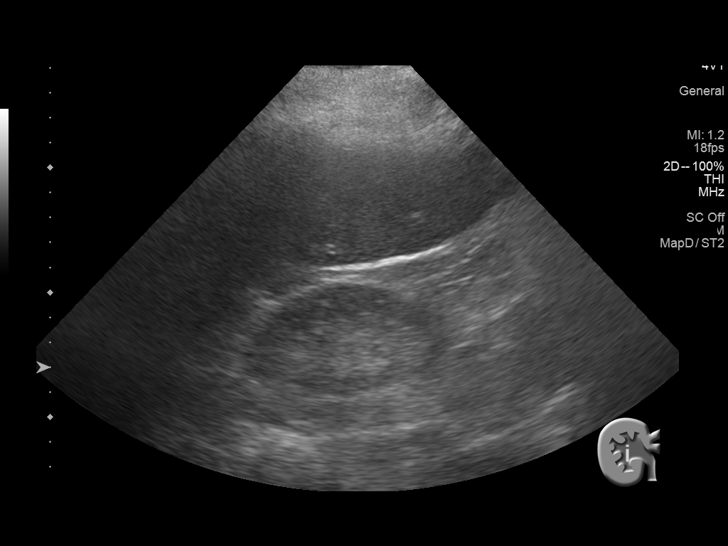
[im 26/56]
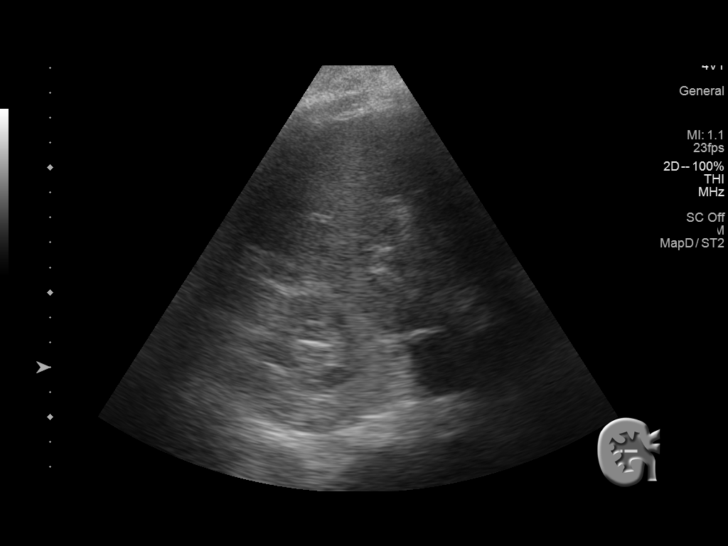
[im 30/56]
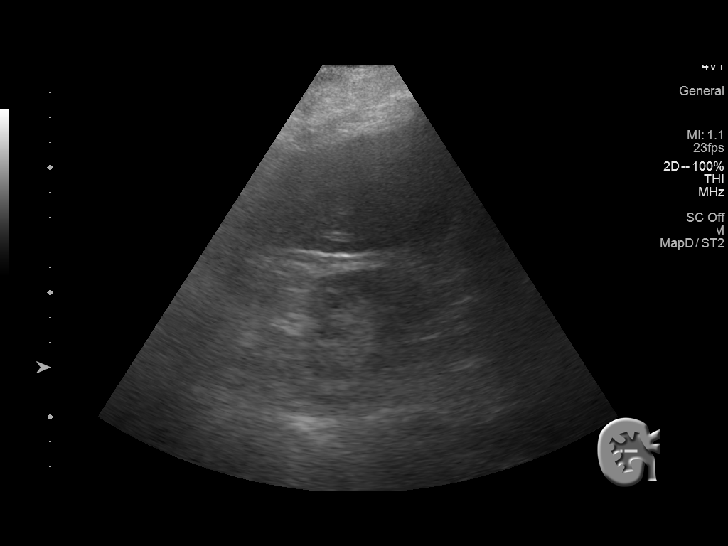
[im 35/56]
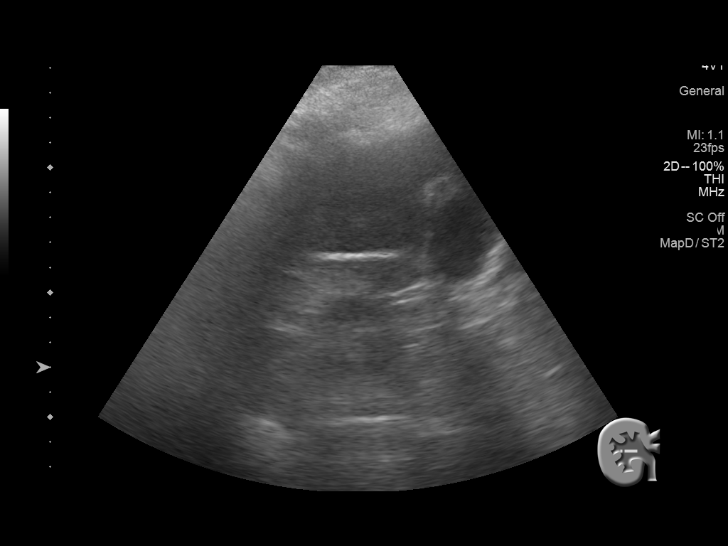
[im 37/56]
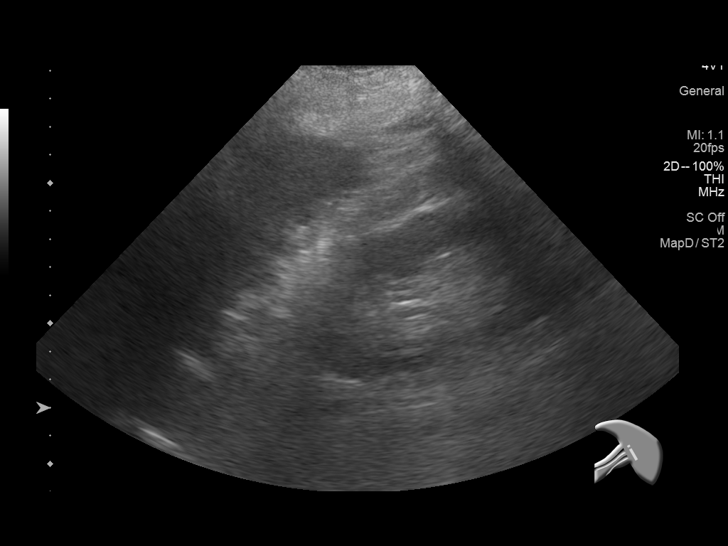
[im 42/56]
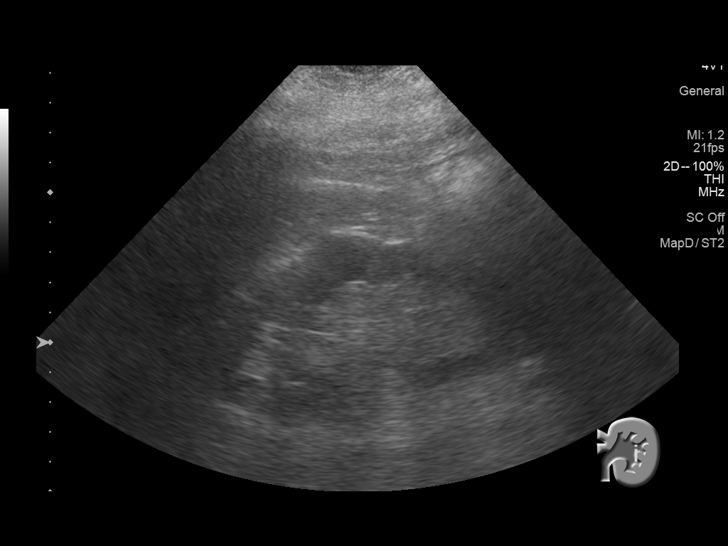
[im 46/56]
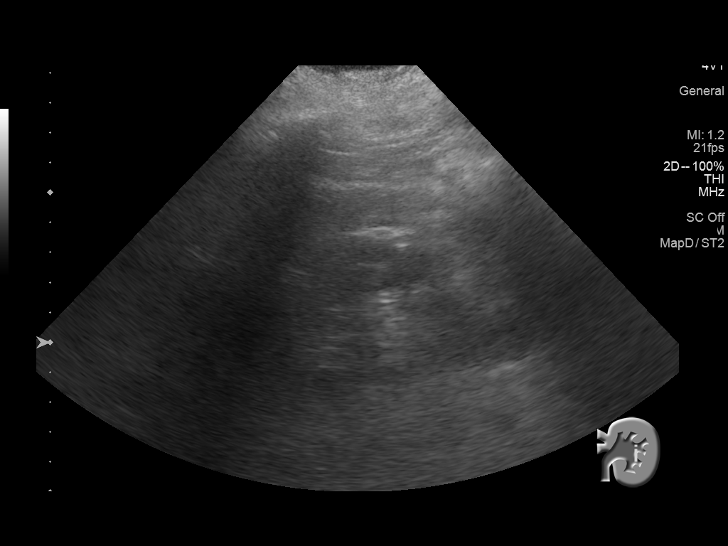
[im 51/56]
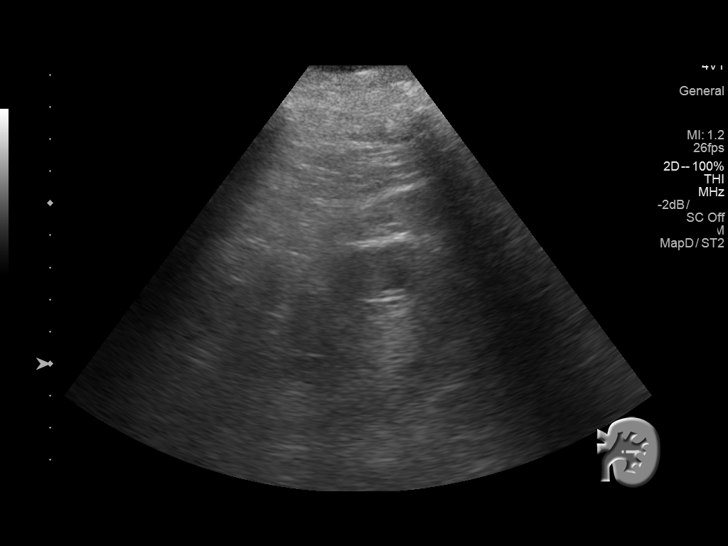
[im 56/56]
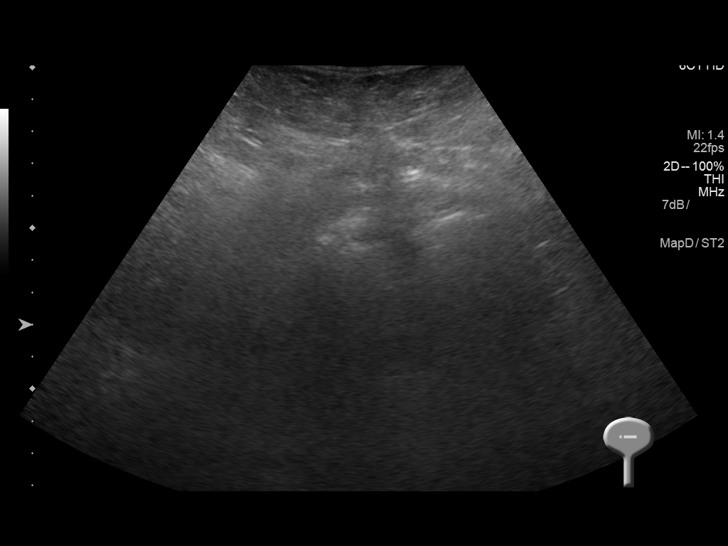

[14 of 25 positions shown; findings below may reference images not displayed]

FINDINGS: Right Kidney:

Length: 9.5 cm. Echogenicity within normal limits. Mild diffuse
renal parenchymal atrophy. No mass or hydronephrosis visualized.

Left Kidney:

Length: 11.8 cm. Echogenicity within normal limits. No mass or
hydronephrosis visualized.

Bladder:

Empty with bladder catheter in place.
IMPRESSION: No evidence of hydronephrosis.

Small right kidney with mild diffuse parenchymal atrophy.

## 2018-11-05 DIAGNOSIS — J441 Chronic obstructive pulmonary disease with (acute) exacerbation: Secondary | ICD-10-CM | POA: Diagnosis not present

## 2018-11-05 DIAGNOSIS — I130 Hypertensive heart and chronic kidney disease with heart failure and stage 1 through stage 4 chronic kidney disease, or unspecified chronic kidney disease: Secondary | ICD-10-CM | POA: Diagnosis not present

## 2018-11-05 DIAGNOSIS — I5033 Acute on chronic diastolic (congestive) heart failure: Secondary | ICD-10-CM | POA: Diagnosis not present

## 2018-11-05 DIAGNOSIS — N183 Chronic kidney disease, stage 3 (moderate): Secondary | ICD-10-CM | POA: Diagnosis not present

## 2018-11-05 DIAGNOSIS — J9621 Acute and chronic respiratory failure with hypoxia: Secondary | ICD-10-CM | POA: Diagnosis not present

## 2018-11-05 DIAGNOSIS — E039 Hypothyroidism, unspecified: Secondary | ICD-10-CM | POA: Diagnosis not present

## 2018-11-05 DIAGNOSIS — K222 Esophageal obstruction: Secondary | ICD-10-CM | POA: Diagnosis not present

## 2018-11-05 DIAGNOSIS — I2510 Atherosclerotic heart disease of native coronary artery without angina pectoris: Secondary | ICD-10-CM | POA: Diagnosis not present

## 2018-11-05 DIAGNOSIS — R69 Illness, unspecified: Secondary | ICD-10-CM | POA: Diagnosis not present

## 2018-11-05 DIAGNOSIS — E785 Hyperlipidemia, unspecified: Secondary | ICD-10-CM | POA: Diagnosis not present

## 2018-11-11 ENCOUNTER — Telehealth: Payer: Self-pay | Admitting: Pharmacist

## 2018-11-11 DIAGNOSIS — E039 Hypothyroidism, unspecified: Secondary | ICD-10-CM | POA: Diagnosis not present

## 2018-11-11 DIAGNOSIS — E785 Hyperlipidemia, unspecified: Secondary | ICD-10-CM | POA: Diagnosis not present

## 2018-11-11 DIAGNOSIS — I5033 Acute on chronic diastolic (congestive) heart failure: Secondary | ICD-10-CM | POA: Diagnosis not present

## 2018-11-11 DIAGNOSIS — K222 Esophageal obstruction: Secondary | ICD-10-CM | POA: Diagnosis not present

## 2018-11-11 DIAGNOSIS — N183 Chronic kidney disease, stage 3 (moderate): Secondary | ICD-10-CM | POA: Diagnosis not present

## 2018-11-11 DIAGNOSIS — J9621 Acute and chronic respiratory failure with hypoxia: Secondary | ICD-10-CM | POA: Diagnosis not present

## 2018-11-11 DIAGNOSIS — R69 Illness, unspecified: Secondary | ICD-10-CM | POA: Diagnosis not present

## 2018-11-11 DIAGNOSIS — J441 Chronic obstructive pulmonary disease with (acute) exacerbation: Secondary | ICD-10-CM | POA: Diagnosis not present

## 2018-11-11 DIAGNOSIS — I2510 Atherosclerotic heart disease of native coronary artery without angina pectoris: Secondary | ICD-10-CM | POA: Diagnosis not present

## 2018-11-11 DIAGNOSIS — I130 Hypertensive heart and chronic kidney disease with heart failure and stage 1 through stage 4 chronic kidney disease, or unspecified chronic kidney disease: Secondary | ICD-10-CM | POA: Diagnosis not present

## 2018-11-11 NOTE — Telephone Encounter (Signed)
[facility] - Dr. Abel Presto dentist needs recs on dental extraction and anticoagulation.  Called Dr. Florene Glen office and advised ok to proceed with one dental extraction without holding anticoagulation. Had to leave message and requested call back if additional information required.

## 2018-11-11 NOTE — Telephone Encounter (Signed)
Spoke with patient and advised that had to leave message with dental office. She states that they refused to pull tooth until speaking with our office. I advised I left message, but can try them again to see if able to speak with someone.   I called dental office again and unable to speak with someone even though office open. Left another message to call back.

## 2018-11-11 NOTE — Telephone Encounter (Signed)
Patient calling back to see if you contacted Dentist

## 2018-11-12 NOTE — Telephone Encounter (Signed)
Dentist called stating patient will be having two extractions and asked for instructions on what patient needs to do about medication

## 2018-11-12 NOTE — Telephone Encounter (Signed)
Spoke with pt - advised her to continue warfarin for 2 dental extractions especially with history of mechanical valve replacement. Advised pt to take 2g of amoxicillin 1 hour prior to dental work. Pt is aware of plan and dentist office has been updated as well, phone [phone].

## 2018-11-16 DIAGNOSIS — E782 Mixed hyperlipidemia: Secondary | ICD-10-CM | POA: Diagnosis not present

## 2018-11-16 DIAGNOSIS — E039 Hypothyroidism, unspecified: Secondary | ICD-10-CM | POA: Diagnosis not present

## 2018-11-16 DIAGNOSIS — G2581 Restless legs syndrome: Secondary | ICD-10-CM | POA: Diagnosis not present

## 2018-11-16 DIAGNOSIS — I509 Heart failure, unspecified: Secondary | ICD-10-CM | POA: Diagnosis not present

## 2018-11-16 DIAGNOSIS — M5136 Other intervertebral disc degeneration, lumbar region: Secondary | ICD-10-CM | POA: Diagnosis not present

## 2018-11-16 DIAGNOSIS — M719 Bursopathy, unspecified: Secondary | ICD-10-CM | POA: Diagnosis not present

## 2018-11-16 DIAGNOSIS — K219 Gastro-esophageal reflux disease without esophagitis: Secondary | ICD-10-CM | POA: Diagnosis not present

## 2018-11-16 DIAGNOSIS — I10 Essential (primary) hypertension: Secondary | ICD-10-CM | POA: Diagnosis not present

## 2018-11-16 DIAGNOSIS — M9903 Segmental and somatic dysfunction of lumbar region: Secondary | ICD-10-CM | POA: Diagnosis not present

## 2018-11-16 DIAGNOSIS — I38 Endocarditis, valve unspecified: Secondary | ICD-10-CM | POA: Diagnosis not present

## 2018-11-17 DIAGNOSIS — N183 Chronic kidney disease, stage 3 (moderate): Secondary | ICD-10-CM | POA: Diagnosis not present

## 2018-11-17 DIAGNOSIS — J441 Chronic obstructive pulmonary disease with (acute) exacerbation: Secondary | ICD-10-CM | POA: Diagnosis not present

## 2018-11-17 DIAGNOSIS — I130 Hypertensive heart and chronic kidney disease with heart failure and stage 1 through stage 4 chronic kidney disease, or unspecified chronic kidney disease: Secondary | ICD-10-CM | POA: Diagnosis not present

## 2018-11-17 DIAGNOSIS — K222 Esophageal obstruction: Secondary | ICD-10-CM | POA: Diagnosis not present

## 2018-11-17 DIAGNOSIS — R69 Illness, unspecified: Secondary | ICD-10-CM | POA: Diagnosis not present

## 2018-11-17 DIAGNOSIS — I5033 Acute on chronic diastolic (congestive) heart failure: Secondary | ICD-10-CM | POA: Diagnosis not present

## 2018-11-17 DIAGNOSIS — E785 Hyperlipidemia, unspecified: Secondary | ICD-10-CM | POA: Diagnosis not present

## 2018-11-17 DIAGNOSIS — I2510 Atherosclerotic heart disease of native coronary artery without angina pectoris: Secondary | ICD-10-CM | POA: Diagnosis not present

## 2018-11-17 DIAGNOSIS — E039 Hypothyroidism, unspecified: Secondary | ICD-10-CM | POA: Diagnosis not present

## 2018-11-17 DIAGNOSIS — J9621 Acute and chronic respiratory failure with hypoxia: Secondary | ICD-10-CM | POA: Diagnosis not present

## 2018-11-19 DIAGNOSIS — I5033 Acute on chronic diastolic (congestive) heart failure: Secondary | ICD-10-CM | POA: Diagnosis not present

## 2018-11-19 DIAGNOSIS — Z7901 Long term (current) use of anticoagulants: Secondary | ICD-10-CM | POA: Diagnosis not present

## 2018-11-19 DIAGNOSIS — J441 Chronic obstructive pulmonary disease with (acute) exacerbation: Secondary | ICD-10-CM | POA: Diagnosis not present

## 2018-11-23 ENCOUNTER — Ambulatory Visit: Payer: Medicare HMO | Admitting: Pharmacist

## 2018-11-23 DIAGNOSIS — Z952 Presence of prosthetic heart valve: Secondary | ICD-10-CM | POA: Diagnosis not present

## 2018-11-23 DIAGNOSIS — Z5181 Encounter for therapeutic drug level monitoring: Secondary | ICD-10-CM | POA: Diagnosis not present

## 2018-11-23 DIAGNOSIS — I4891 Unspecified atrial fibrillation: Secondary | ICD-10-CM | POA: Diagnosis not present

## 2018-11-23 DIAGNOSIS — I482 Chronic atrial fibrillation, unspecified: Secondary | ICD-10-CM

## 2018-11-23 LAB — POCT INR: INR: 2.1 (ref 2.0–3.0)

## 2018-11-23 NOTE — Patient Instructions (Signed)
Description   Continue coumadin 1 1/2 tablet daily except 2 tablets on Tuesdays Recheck in 4 weeks.

## 2018-11-25 ENCOUNTER — Encounter: Payer: Self-pay | Admitting: Internal Medicine

## 2018-12-08 NOTE — Progress Notes (Deleted)
Cardiology Office Note  Date: 12/08/2018   ID: [name], DOB Aug 22, 1951, MRN [id number]  PCP: Octavio Graves, DO  Primary Cardiologist: Rozann Lesches, MD   No chief complaint on file.   History of Present Illness: [name] is a 68 y.o. female last seen in December 2019.  Since last visit Lasix was decreased to 120 mg in the morning and 80 mg in the afternoon.  On higher dose her BUN and creatinine had bumped up to 34 and 1.66 (previously 1.11) with potassium 3.9.  Past Medical History:  Diagnosis Date  . Acute on chronic diastolic (congestive) heart failure ([place])   . Anxiety disorder   . Aortic stenosis    Status post St. Jude mechanical AVR 2007  . Asthma   . Atrial fibrillation ([place])   . Carcinoid tumor of colon 2007  . Coronary atherosclerosis of native coronary artery    Status post CABG 2007  . GERD (gastroesophageal reflux disease)   . History of colonoscopy 2003   Dr. Gala Romney - normal  . Hyperlipidemia   . Macromastia   . Pedal edema    Bilateral, chronic  . Peptic stricture of esophagus 10/04/2010   GE junction on last EGD by Dr. Gala Romney, benign biopsies  . RLS (restless legs syndrome)   . Schatzki's ring     Past Surgical History:  Procedure Laterality Date  . ABDOMINAL HYSTERECTOMY    . AORTIC VALVE REPLACEMENT  2007   #25 mm St. 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 APPENDECTOMY  2007  . BACK SURGERY     lumbar 4 and 5   . BIOPSY  02/05/2012   RMR:Two tongues of salmon-colored epithelium distal esophagus, very short-segment Barrett's s/p bx/Small hiatal hernia, otherwise normal stomach, D1, D2. Status post esophageal dilation. Biopsy showed GERD.  . Breast cyst removed     bilateral  . BREAST REDUCTION SURGERY    . CESAREAN SECTION    . 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 COLONOSCOPY  02/05/2012   FIE:PPIRJJ rectum, sigmoid diverticulosis,descending colon polyp ,  tubular adenoma  . CORONARY ARTERY BYPASS GRAFT     06/2006 - RIMA to RCA, SVG to RCA  . ESOPHAGOGASTRODUODENOSCOPY  10/03/2010   Dr. Gala Romney- schatzki's ring, shoft peptic stricture at GE junction.  . ESOPHAGOGASTRODUODENOSCOPY (EGD) WITH PROPOFOL N/A 02/08/2018   Procedure: ESOPHAGOGASTRODUODENOSCOPY (EGD) WITH PROPOFOL;  Surgeon: Daneil Dolin, MD;  [facility];  Service: Endoscopy;  Laterality: N/A;  8:15am  . FOOT SURGERY     bilateral bunionectomy  . HERNIA REPAIR     with mesh  . LAPAROTOMY  2007   small bowel resection secondary to small bowel obstruction  . MALONEY DILATION  02/05/2012   Procedure: Venia Minks DILATION;  Surgeon: Daneil Dolin, MD;  Location: [facility]S;  Service: Endoscopy;  Laterality: N/A;  65mm   . MALONEY DILATION N/A 02/08/2018   Procedure: Venia Minks DILATION;  Surgeon: Daneil Dolin, MD;  [facility];  Service: Endoscopy;  Laterality: N/A;  . Teeth removal      Current Outpatient Medications  Medication Sig Dispense Refill  . allopurinol (ZYLOPRIM) 100 MG tablet Take 100 mg by mouth daily.    Marland Kitchen ALPRAZolam (XANAX) 0.25 MG tablet Take 1 tablet (0.25 mg total) by mouth 2 (two) times daily as needed for anxiety. (Patient taking differently: Take 1 mg by mouth 2 (two) times  daily as needed for anxiety. ) 10 tablet 0  . bisoprolol (ZEBETA) 5 MG tablet Take 1 tablet (5 mg total) by mouth daily. 30 tablet 1  . budesonide-formoterol (SYMBICORT) 160-4.5 MCG/ACT inhaler Inhale 2 puffs into the lungs 2 (two) times daily. 1 Inhaler 3  . calcium carbonate (OS-CAL) 600 MG TABS tablet Take 600 mg by mouth 2 (two) times daily.    . cetirizine (ZYRTEC) 10 MG tablet Take 10 mg by mouth daily.      . CRESTOR 10 MG tablet Take 10 mg by mouth at bedtime.     . cyclobenzaprine (FLEXERIL) 10 MG tablet Take 10 mg by mouth daily as needed for muscle spasms.    Marland Kitchen diltiazem (CARDIZEM CD) 120 MG 24 hr capsule Take 1 capsule (120 mg total) by mouth daily. 30 capsule 2    . furosemide (LASIX) 80 MG tablet Take 1.5 tablets (120 mg total) by mouth every morning. & 80 mg in the afternoon 90 tablet 1  . HYDROcodone-acetaminophen (NORCO) 10-325 MG tablet Take 1 tablet by mouth daily as needed for moderate pain. Normally takes one tablet at bedtime for back pain    . Ipratropium-Albuterol (COMBIVENT) 20-100 MCG/ACT AERS respimat Inhale 1-2 puffs into the lungs every 6 (six) hours as needed for wheezing or shortness of breath. 1 Inhaler 3  . levothyroxine (SYNTHROID, LEVOTHROID) 50 MCG tablet Take 50 mcg by mouth daily before breakfast.     . magnesium oxide (MAG-OX) 400 MG tablet Take 400 mg by mouth daily.    . metolazone (ZAROXOLYN) 2.5 MG tablet Take one tablet on Saturday and Sunday then daily as needed if your weight go up more than 2-3 pounds. 30 tablet 0  . Multiple Vitamin (MULTIVITAMIN WITH MINERALS) TABS tablet Take 1 tablet by mouth daily.    Marland Kitchen omeprazole (PRILOSEC) 20 MG capsule Take 1 capsule (20 mg total) by mouth 2 (two) times daily before a meal. 60 capsule 5  . OXYGEN Inhale 2 L into the lungs daily.     . potassium chloride (KLOR-CON) 20 MEQ packet Take 20 mEq by mouth 2 (two) times daily.     Marland Kitchen rOPINIRole (REQUIP) 3 MG tablet Take 3 mg by mouth 2 (two) times daily as needed (for Restless leg syndrome).     . temazepam (RESTORIL) 30 MG capsule Take 30 mg by mouth at bedtime as needed for sleep.     Marland Kitchen warfarin (COUMADIN) 2 MG tablet Take 1.5-2 tablets (3-4 mg total) by mouth See admin instructions. 4mg  on Tuesday and 3 mg daily the rest of the week.     No current facility-administered medications for this visit.    Allergies:  Penicillins   Social History: The patient  reports that she quit smoking about 30 years ago. Her smoking use included cigarettes. She has a 20.00 pack-year smoking history. She has never used smokeless tobacco. She reports that she does not drink alcohol or use drugs.   Family History: The patient's family history includes  Cancer in her father; Stroke in her mother.   ROS:  Please see the history of present illness. Otherwise, complete review of systems is positive for {NONE DEFAULTED:18576::"none"}.  All other systems are reviewed and negative.   Physical Exam: VS:  There were no vitals taken for this visit., BMI There is no height or weight on file to calculate BMI.  Wt Readings from Last 3 Encounters:  10/08/18 178 lb 6.4 oz (80.9 kg)  09/02/18 192 lb 7.4  oz (87.3 kg)  08/09/18 173 lb 9.6 oz (78.7 kg)    General: Patient appears comfortable at rest. HEENT: Conjunctiva and lids normal, oropharynx clear with moist mucosa. Neck: Supple, no elevated JVP or carotid bruits, no thyromegaly. Lungs: Clear to auscultation, nonlabored breathing at rest. Cardiac: Regular rate and rhythm, no S3 or significant systolic murmur, no pericardial rub. Abdomen: Soft, nontender, no hepatomegaly, bowel sounds present, no guarding or rebound. Extremities: No pitting edema, distal pulses 2+. Skin: Warm and dry. Musculoskeletal: No kyphosis. Neuropsychiatric: Alert and oriented x3, affect grossly appropriate.  ECG: I personally reviewed the tracing from 09/01/2018 which showed rate controlled atrial fibrillation with diffuse ST T wave abnormalities.  Recent Labwork: 08/01/2018: Magnesium 2.1 09/01/2018: B Natriuretic Peptide 958.0 09/02/2018: ALT 13; AST 23 09/05/2018: BUN 35; Creatinine, Ser 1.11; Hemoglobin 9.6; Platelets 349; Potassium 3.8; Sodium 139   Other Studies Reviewed Today:  Echocardiogram 07/27/2018: Study Conclusions  - Left ventricle: The cavity size was normal. Wall thickness was increased in a pattern of mild LVH. Systolic function was normal. The estimated ejection fraction was in the range of 60% to 65%. Wall motion was normal; there were no regional wall motion abnormalities. The study was not technically sufficient to allow evaluation of LV diastolic dysfunction due to  atrial fibrillation. - Aortic valve: St. Jude mechanical prosthesis in aortic position. There was no significant regurgitation. Mean gradient (S): 6 mm Hg. Peak gradient (S): 11 mm Hg. Normal prosthetic function based on gradients. - Mitral valve: Moderately calcified annulus. There was mild regurgitation directed eccentrically. - Left atrium: The atrium was severely dilated. - Right ventricle: The cavity size was moderately dilated. Systolic function was mildly reduced. - Right atrium: The atrium was at the upper limits of normal in size. Central venous pressure (est): 15 mm Hg. - Atrial septum: No defect or patent foramen ovale was identified. - Tricuspid valve: There was mild-moderate regurgitation. - Pulmonary arteries: PA peak pressure: 39 mm Hg (S). - Pericardium, extracardiac: There was no pericardial effusion.  Assessment and Plan:    Current medicines were reviewed with the patient today.  No orders of the defined types were placed in this encounter.   Disposition:  Signed, Satira Sark, MD, [facility] 12/08/2018 10:29 AM    [place] [facility] [place], [place], [place] [address] Phone: [phone]; Fax: [phone]

## 2018-12-09 ENCOUNTER — Ambulatory Visit: Payer: Medicare HMO | Admitting: Cardiology

## 2018-12-09 ENCOUNTER — Encounter: Payer: Self-pay | Admitting: Cardiology

## 2018-12-09 DIAGNOSIS — R0989 Other specified symptoms and signs involving the circulatory and respiratory systems: Secondary | ICD-10-CM

## 2018-12-16 DIAGNOSIS — M9903 Segmental and somatic dysfunction of lumbar region: Secondary | ICD-10-CM | POA: Diagnosis not present

## 2018-12-16 DIAGNOSIS — I10 Essential (primary) hypertension: Secondary | ICD-10-CM | POA: Diagnosis not present

## 2018-12-16 DIAGNOSIS — I509 Heart failure, unspecified: Secondary | ICD-10-CM | POA: Diagnosis not present

## 2018-12-16 DIAGNOSIS — M109 Gout, unspecified: Secondary | ICD-10-CM | POA: Diagnosis not present

## 2018-12-16 DIAGNOSIS — I38 Endocarditis, valve unspecified: Secondary | ICD-10-CM | POA: Diagnosis not present

## 2018-12-16 DIAGNOSIS — R69 Illness, unspecified: Secondary | ICD-10-CM | POA: Diagnosis not present

## 2018-12-16 DIAGNOSIS — M9901 Segmental and somatic dysfunction of cervical region: Secondary | ICD-10-CM | POA: Diagnosis not present

## 2018-12-16 DIAGNOSIS — M9902 Segmental and somatic dysfunction of thoracic region: Secondary | ICD-10-CM | POA: Diagnosis not present

## 2018-12-18 DIAGNOSIS — Z791 Long term (current) use of non-steroidal anti-inflammatories (NSAID): Secondary | ICD-10-CM | POA: Diagnosis not present

## 2018-12-18 DIAGNOSIS — J441 Chronic obstructive pulmonary disease with (acute) exacerbation: Secondary | ICD-10-CM | POA: Diagnosis not present

## 2018-12-18 DIAGNOSIS — Z7901 Long term (current) use of anticoagulants: Secondary | ICD-10-CM | POA: Diagnosis not present

## 2018-12-18 DIAGNOSIS — I5033 Acute on chronic diastolic (congestive) heart failure: Secondary | ICD-10-CM | POA: Diagnosis not present

## 2018-12-21 ENCOUNTER — Ambulatory Visit: Payer: Medicare HMO | Admitting: *Deleted

## 2018-12-21 DIAGNOSIS — I482 Chronic atrial fibrillation, unspecified: Secondary | ICD-10-CM

## 2018-12-21 DIAGNOSIS — Z952 Presence of prosthetic heart valve: Secondary | ICD-10-CM

## 2018-12-21 DIAGNOSIS — I4891 Unspecified atrial fibrillation: Secondary | ICD-10-CM

## 2018-12-21 DIAGNOSIS — Z5181 Encounter for therapeutic drug level monitoring: Secondary | ICD-10-CM

## 2018-12-21 LAB — POCT INR: INR: 1.9 — AB (ref 2.0–3.0)

## 2018-12-21 NOTE — Patient Instructions (Signed)
Take coumadin 3 tablets tonight then resume 1 1/2 tablet daily except 2 tablets on Tuesdays Recheck in 4 weeks.

## 2018-12-28 ENCOUNTER — Encounter (HOSPITAL_COMMUNITY): Payer: Self-pay

## 2018-12-28 ENCOUNTER — Emergency Department (HOSPITAL_COMMUNITY): Payer: Medicare HMO

## 2018-12-28 ENCOUNTER — Other Ambulatory Visit: Payer: Self-pay

## 2018-12-28 ENCOUNTER — Emergency Department (HOSPITAL_COMMUNITY)
Admission: EM | Admit: 2018-12-28 | Discharge: 2018-12-28 | Disposition: A | Discharge status: Home / Self Care | Payer: Medicare HMO | Attending: Emergency Medicine | Admitting: Emergency Medicine

## 2018-12-28 DIAGNOSIS — R0602 Shortness of breath: Secondary | ICD-10-CM | POA: Insufficient documentation

## 2018-12-28 DIAGNOSIS — I509 Heart failure, unspecified: Secondary | ICD-10-CM | POA: Diagnosis not present

## 2018-12-28 DIAGNOSIS — Z87891 Personal history of nicotine dependence: Secondary | ICD-10-CM | POA: Insufficient documentation

## 2018-12-28 DIAGNOSIS — Z7901 Long term (current) use of anticoagulants: Secondary | ICD-10-CM | POA: Diagnosis not present

## 2018-12-28 DIAGNOSIS — J45909 Unspecified asthma, uncomplicated: Secondary | ICD-10-CM | POA: Insufficient documentation

## 2018-12-28 DIAGNOSIS — I130 Hypertensive heart and chronic kidney disease with heart failure and stage 1 through stage 4 chronic kidney disease, or unspecified chronic kidney disease: Secondary | ICD-10-CM | POA: Diagnosis not present

## 2018-12-28 DIAGNOSIS — I110 Hypertensive heart disease with heart failure: Secondary | ICD-10-CM | POA: Diagnosis not present

## 2018-12-28 DIAGNOSIS — I2510 Atherosclerotic heart disease of native coronary artery without angina pectoris: Secondary | ICD-10-CM | POA: Diagnosis not present

## 2018-12-28 DIAGNOSIS — I5032 Chronic diastolic (congestive) heart failure: Secondary | ICD-10-CM | POA: Insufficient documentation

## 2018-12-28 DIAGNOSIS — Z79899 Other long term (current) drug therapy: Secondary | ICD-10-CM | POA: Diagnosis not present

## 2018-12-28 DIAGNOSIS — N183 Chronic kidney disease, stage 3 (moderate): Secondary | ICD-10-CM | POA: Diagnosis not present

## 2018-12-28 LAB — CBC WITH DIFFERENTIAL/PLATELET
Abs Immature Granulocytes: 0.03 10*3/uL (ref 0.00–0.07)
Basophils Absolute: 0 10*3/uL (ref 0.0–0.1)
Basophils Relative: 0 %
EOS ABS: 0 10*3/uL (ref 0.0–0.5)
EOS PCT: 1 %
HCT: 30.7 % — ABNORMAL LOW (ref 36.0–46.0)
HEMOGLOBIN: 9.4 g/dL — AB (ref 12.0–15.0)
Immature Granulocytes: 0 %
LYMPHS PCT: 15 %
Lymphs Abs: 1.3 10*3/uL (ref 0.7–4.0)
MCH: 27 pg (ref 26.0–34.0)
MCHC: 30.6 g/dL (ref 30.0–36.0)
MCV: 88.2 fL (ref 80.0–100.0)
MONO ABS: 0.9 10*3/uL (ref 0.1–1.0)
Monocytes Relative: 10 %
Neutro Abs: 6.4 10*3/uL (ref 1.7–7.7)
Neutrophils Relative %: 74 %
Platelets: 195 10*3/uL (ref 150–400)
RBC: 3.48 MIL/uL — AB (ref 3.87–5.11)
RDW: 16.6 % — AB (ref 11.5–15.5)
WBC: 8.7 10*3/uL (ref 4.0–10.5)
nRBC: 0 % (ref 0.0–0.2)

## 2018-12-28 LAB — BASIC METABOLIC PANEL
Anion gap: 14 (ref 5–15)
BUN: 26 mg/dL — AB (ref 8–23)
CO2: 34 mmol/L — ABNORMAL HIGH (ref 22–32)
Calcium: 9.2 mg/dL (ref 8.9–10.3)
Chloride: 87 mmol/L — ABNORMAL LOW (ref 98–111)
Creatinine, Ser: 1.67 mg/dL — ABNORMAL HIGH (ref 0.44–1.00)
GFR calc Af Amer: 36 mL/min — ABNORMAL LOW (ref >60)
GFR, EST NON AFRICAN AMERICAN: 31 mL/min — AB (ref >60)
GLUCOSE: 126 mg/dL — AB (ref 70–99)
POTASSIUM: 3 mmol/L — AB (ref 3.5–5.1)
Sodium: 135 mmol/L (ref 135–145)

## 2018-12-28 LAB — BRAIN NATRIURETIC PEPTIDE: B Natriuretic Peptide: 652 pg/mL — ABNORMAL HIGH (ref 0.0–100.0)

## 2018-12-28 LAB — TROPONIN I

## 2018-12-28 MED ORDER — FUROSEMIDE 10 MG/ML IJ SOLN
40.0000 mg | Freq: Once | INTRAMUSCULAR | Status: AC
  Administered 2018-12-28: 40 mg via INTRAVENOUS
  Filled 2018-12-28: qty 4

## 2018-12-28 NOTE — Discharge Instructions (Addendum)
Increase your evening dose of Lasix to 160 mg the next 5 days.  Follow-up with your primary doctor in the next week, and return to the ER if you develop worsening breathing, chest pain, or other new and concerning symptoms.

## 2018-12-28 NOTE — ED Notes (Signed)
Patient transported to X-ray 

## 2018-12-28 NOTE — ED Provider Notes (Signed)
[facility] EMERGENCY DEPARTMENT Provider Note   CSN: [id number] Arrival date & time: 12/28/18  1007    History   Chief Complaint Chief Complaint  Patient presents with  . Shortness of Breath    HPI [name] is a 68 y.o. female.     Patient is a 68 year old female with history of congestive heart failure, aortic valve replacement, atrial fibrillation, GERD, and hyperlipidemia.  She presents with a 3-day history of shortness of breath.  She was seen at the doctor's office this morning, then sent here for evaluation of suspected CHF exacerbation.  Patient denies increased leg swelling.  She does report some cough that has been nonproductive, but denies any fevers or ill contacts.  Patient reports being compliant with her Lasix.  The history is provided by the patient.  Shortness of Breath  Severity:  Moderate Onset quality:  Gradual Duration:  3 days Timing:  Constant Progression:  Worsening Chronicity:  Recurrent Context: activity   Relieved by:  Nothing Worsened by:  Activity Ineffective treatments:  None tried   Past Medical History:  Diagnosis Date  . Acute on chronic diastolic (congestive) heart failure ([place])   . Anxiety disorder   . Aortic stenosis    Status post St. Jude mechanical AVR 2007  . Asthma   . Atrial fibrillation ([place])   . Carcinoid tumor of colon 2007  . Coronary atherosclerosis of native coronary artery    Status post CABG 2007  . GERD (gastroesophageal reflux disease)   . History of colonoscopy 2003   Dr. Gala Romney - normal  . Hyperlipidemia   . Macromastia   . Pedal edema    Bilateral, chronic  . Peptic stricture of esophagus 10/04/2010   GE junction on last EGD by Dr. Gala Romney, benign biopsies  . RLS (restless legs syndrome)   . Schatzki's ring     Patient Active Problem List   Diagnosis Date Noted  . Benign essential HTN   . CHF (congestive heart failure) ([place]) 07/26/2018  . Transaminitis 07/23/2018  . Acute on chronic respiratory  failure with hypoxia and hypercapnia ([place]) 06/19/2018  . Acute on chronic respiratory failure with hypoxia ([place])   . Supratherapeutic INR 05/03/2018  . CHF exacerbation ([place]) 05/02/2018  . Acute metabolic encephalopathy 87/86/7672  . CKD (chronic kidney disease) stage 3, GFR 30-59 ml/min ([facility]) 05/02/2018  . Hypothyroidism 05/02/2018  . Generalized weakness 05/02/2018  . Physical deconditioning   . Coronary artery disease of bypass graft of native heart with stable angina pectoris ([place])   . Acute renal failure superimposed on stage 3 chronic kidney disease ([place])   . Acute on chronic respiratory failure with hypercapnia ([place]) 03/24/2018  . Hypomagnesemia 03/24/2018  . Hypokalemia 03/24/2018  . Hypophosphatemia 03/24/2018  . Atrial fibrillation with RVR ([place]) 03/24/2018  . Elevated troponin 03/24/2018  . Coughing 01/14/2018  . Anemia 01/14/2018  . Hx of adenomatous colonic polyps 01/14/2018  . Acute on chronic diastolic congestive heart failure ([place]) 12/17/2016  . COPD with acute exacerbation ([place]) 12/17/2016  . Bilateral leg edema 07/05/2014  . Encounter for therapeutic drug monitoring 11/15/2013  . Chronic venous insufficiency 08/26/2011  . Chronic anticoagulation 01/11/2011  . Peptic stricture of esophagus 10/04/2010  . GERD 09/16/2010  . DYSPHAGIA 09/16/2010  . H/O heart valve replacement with mechanical valve 06/05/2009  . Hyperlipidemia 06/04/2009  . Coronary atherosclerosis of native coronary artery 06/04/2009  . Atrial fibrillation, chronic 06/04/2009    Past Surgical History:  Procedure Laterality Date  .  ABDOMINAL HYSTERECTOMY    . AORTIC VALVE REPLACEMENT  2007   #25 mm St. 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 APPENDECTOMY  2007  . BACK SURGERY     lumbar 4 and 5   . BIOPSY  02/05/2012   RMR:Two tongues of salmon-colored epithelium distal esophagus, very short-segment Barrett's s/p bx/Small hiatal hernia, otherwise  normal stomach, D1, D2. Status post esophageal dilation. Biopsy showed GERD.  . Breast cyst removed     bilateral  . BREAST REDUCTION SURGERY    . CESAREAN SECTION    . 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 COLONOSCOPY  02/05/2012   NGE:XBMWUX rectum, sigmoid diverticulosis,descending colon polyp , tubular adenoma  . CORONARY ARTERY BYPASS GRAFT     06/2006 - RIMA to RCA, SVG to RCA  . ESOPHAGOGASTRODUODENOSCOPY  10/03/2010   Dr. Gala Romney- schatzki's ring, shoft peptic stricture at GE junction.  . ESOPHAGOGASTRODUODENOSCOPY (EGD) WITH PROPOFOL N/A 02/08/2018   Procedure: ESOPHAGOGASTRODUODENOSCOPY (EGD) WITH PROPOFOL;  Surgeon: Daneil Dolin, MD;  Location: [facility] ENDO SUITE;  Service: Endoscopy;  Laterality: N/A;  8:15am  . FOOT SURGERY     bilateral bunionectomy  . HERNIA REPAIR     with mesh  . LAPAROTOMY  2007   small bowel resection secondary to small bowel obstruction  . MALONEY DILATION  02/05/2012   Procedure: Venia Minks DILATION;  Surgeon: Daneil Dolin, MD;  Location: [facility] ORS;  Service: Endoscopy;  Laterality: N/A;  71mm   . MALONEY DILATION N/A 02/08/2018   Procedure: Venia Minks DILATION;  Surgeon: Daneil Dolin, MD;  Location: [facility] ENDO SUITE;  Service: Endoscopy;  Laterality: N/A;  . Teeth removal       OB History    Gravida  3   Para  3   Term  3   Preterm      AB      Living  1     SAB      TAB      Ectopic      Multiple      Live Births               Home Medications    Prior to Admission medications   Medication Sig Start Date End Date Taking? Authorizing Provider  allopurinol (ZYLOPRIM) 100 MG tablet Take 100 mg by mouth daily.    [provider]  ALPRAZolam Duanne Moron) 0.25 MG tablet Take 1 tablet (0.25 mg total) by mouth 2 (two) times daily as needed for anxiety. Patient taking differently: Take 1 mg by mouth 2 (two) times daily as needed for anxiety.  05/05/18   Johnson, Clanford L, MD  bisoprolol (ZEBETA) 5 MG tablet  Take 1 tablet (5 mg total) by mouth daily. 08/03/18   Orson Eva, MD  budesonide-formoterol (SYMBICORT) 160-4.5 MCG/ACT inhaler Inhale 2 puffs into the lungs 2 (two) times daily. 03/31/18   Barton Dubois, MD  calcium carbonate (OS-CAL) 600 MG TABS tablet Take 600 mg by mouth 2 (two) times daily.    [provider]  cetirizine (ZYRTEC) 10 MG tablet Take 10 mg by mouth daily.      [provider]  CRESTOR 10 MG tablet Take 10 mg by mouth at bedtime.  12/02/13   [provider]  cyclobenzaprine (FLEXERIL) 10 MG tablet Take 10 mg by mouth daily as needed for muscle spasms.    [provider]  diltiazem (CARDIZEM CD) 120 MG  24 hr capsule Take 1 capsule (120 mg total) by mouth daily. 04/01/18   Barton Dubois, MD  furosemide (LASIX) 80 MG tablet Take 1.5 tablets (120 mg total) by mouth every morning. & 80 mg in the afternoon 10/11/18   Satira Sark, MD  HYDROcodone-acetaminophen [facility]) 10-325 MG tablet Take 1 tablet by mouth daily as needed for moderate pain. Normally takes one tablet at bedtime for back pain    [provider]  Ipratropium-Albuterol (COMBIVENT) 20-100 MCG/ACT AERS respimat Inhale 1-2 puffs into the lungs every 6 (six) hours as needed for wheezing or shortness of breath. 09/05/18   Barton Dubois, MD  levothyroxine (SYNTHROID, LEVOTHROID) 50 MCG tablet Take 50 mcg by mouth daily before breakfast.     [provider]  magnesium oxide (MAG-OX) 400 MG tablet Take 400 mg by mouth daily.    [provider]  metolazone (ZAROXOLYN) 2.5 MG tablet Take one tablet on Saturday and Sunday then daily as needed if your weight go up more than 2-3 pounds. 10/08/18   Satira Sark, MD  Multiple Vitamin (MULTIVITAMIN WITH MINERALS) TABS tablet Take 1 tablet by mouth daily.    [provider]  omeprazole (PRILOSEC) 20 MG capsule Take 1 capsule (20 mg total) by mouth 2 (two) times daily before a meal. 07/22/18   Mahala Menghini,  PA-C  OXYGEN Inhale 2 L into the lungs daily.     [provider]  potassium chloride (KLOR-CON) 20 MEQ packet Take 20 mEq by mouth 2 (two) times daily.     [provider]  rOPINIRole (REQUIP) 3 MG tablet Take 3 mg by mouth 2 (two) times daily as needed (for Restless leg syndrome).     [provider]  temazepam (RESTORIL) 30 MG capsule Take 30 mg by mouth at bedtime as needed for sleep.  06/24/18   [provider]  warfarin (COUMADIN) 2 MG tablet Take 1.5-2 tablets (3-4 mg total) by mouth See admin instructions. 4mg  on Tuesday and 3 mg daily the rest of the week. 09/06/18   Barton Dubois, MD    Family History Family History  Problem Relation Age of Onset  . Stroke Mother   . Cancer Father   . Anesthesia problems Neg Hx   . Hypotension Neg Hx   . Malignant hyperthermia Neg Hx   . Pseudochol deficiency Neg Hx     Social History Social History   Tobacco Use  . Smoking status: Former Smoker    Packs/day: 1.00    Years: 20.00    Pack years: 20.00    Types: Cigarettes    Last attempt to quit: 10/20/1988    Years since quitting: 30.2  . Smokeless tobacco: Never Used  Substance Use Topics  . Alcohol use: No    Alcohol/week: 0.0 standard drinks  . Drug use: No     Allergies   Penicillins   Review of Systems Review of Systems  Respiratory: Positive for shortness of breath.   All other systems reviewed and are negative.    Physical Exam Updated Vital Signs BP 105/77 (BP Location: Right Arm)   Pulse 75   Temp 98.1 F (36.7 C) (Oral)   Resp (!) 22   Ht 5\' 8"  (1.727 m)   Wt 84.5 kg   SpO2 94%   BMI 28.33 kg/m   Physical Exam Vitals signs and nursing note reviewed.  Constitutional:      General: She is not in acute distress.  Appearance: She is well-developed. She is not diaphoretic.  HENT:     Head: Normocephalic and atraumatic.  Neck:     Musculoskeletal: Normal range of motion and neck supple.  Cardiovascular:     Rate  and Rhythm: Normal rate and regular rhythm.     Heart sounds: No murmur. No friction rub. No gallop.   Pulmonary:     Effort: Pulmonary effort is normal. No respiratory distress.     Breath sounds: Examination of the right-lower field reveals rales. Examination of the left-lower field reveals rales. Rales present. No wheezing.  Abdominal:     General: Bowel sounds are normal. There is no distension.     Palpations: Abdomen is soft.     Tenderness: There is no abdominal tenderness.  Musculoskeletal: Normal range of motion.     Right lower leg: No edema.     Left lower leg: No edema.  Skin:    General: Skin is warm and dry.  Neurological:     Mental Status: She is alert and oriented to person, place, and time.      ED Treatments / Results  Labs (all labs ordered are listed, but only abnormal results are displayed) Labs Reviewed  CBC WITH DIFFERENTIAL/PLATELET - Abnormal; Notable for the following components:      Result Value   RBC 3.48 (*)    Hemoglobin 9.4 (*)    HCT 30.7 (*)    RDW 16.6 (*)    All other components within normal limits  TROPONIN I  BASIC METABOLIC PANEL  BRAIN NATRIURETIC PEPTIDE    EKG None  Radiology No results found.  Procedures Procedures (including critical care time)  Medications Ordered in ED Medications  furosemide (LASIX) injection 40 mg (has no administration in time range)     Initial Impression / Assessment and Plan / ED Course  I have reviewed the triage vital signs and the nursing notes.  Pertinent labs & imaging results that were available during my care of the patient were reviewed by me and considered in my medical decision making (see chart for details).  Patient presenting here with complaints of shortness of breath at the recommendation of her primary doctor.  Patient appears clinically well.  Her work-up shows BNP of 652 which is slightly up from her baseline.  She was given IV Lasix with a good diuresis.  Remainder of the  work-up shows no evidence for an acute cardiac event and I believe the patient to be appropriate for discharge.  I will have her increase her afternoon dose of Lasix for the next 5 days and follow-up with her primary doctor.  Final Clinical Impressions(s) / ED Diagnoses   Final diagnoses:  None    ED Discharge Orders    None       Veryl Speak, MD 12/28/18 1240

## 2018-12-28 NOTE — ED Triage Notes (Signed)
Pt c/o sob x 4 days and cough.  Reports cough is productive at times.  Reports left sided chest pain when she takes a deep breath.

## 2019-01-04 DIAGNOSIS — R69 Illness, unspecified: Secondary | ICD-10-CM | POA: Diagnosis not present

## 2019-01-04 DIAGNOSIS — I10 Essential (primary) hypertension: Secondary | ICD-10-CM | POA: Diagnosis not present

## 2019-01-04 DIAGNOSIS — I38 Endocarditis, valve unspecified: Secondary | ICD-10-CM | POA: Diagnosis not present

## 2019-01-04 DIAGNOSIS — E039 Hypothyroidism, unspecified: Secondary | ICD-10-CM | POA: Diagnosis not present

## 2019-01-05 DIAGNOSIS — H5210 Myopia, unspecified eye: Secondary | ICD-10-CM | POA: Diagnosis not present

## 2019-01-05 DIAGNOSIS — H25813 Combined forms of age-related cataract, bilateral: Secondary | ICD-10-CM | POA: Diagnosis not present

## 2019-01-16 DIAGNOSIS — Z791 Long term (current) use of non-steroidal anti-inflammatories (NSAID): Secondary | ICD-10-CM | POA: Diagnosis not present

## 2019-01-16 DIAGNOSIS — J441 Chronic obstructive pulmonary disease with (acute) exacerbation: Secondary | ICD-10-CM | POA: Diagnosis not present

## 2019-01-16 DIAGNOSIS — I5033 Acute on chronic diastolic (congestive) heart failure: Secondary | ICD-10-CM | POA: Diagnosis not present

## 2019-01-17 ENCOUNTER — Other Ambulatory Visit: Payer: Self-pay

## 2019-01-18 ENCOUNTER — Ambulatory Visit: Payer: Medicare HMO | Admitting: *Deleted

## 2019-01-18 DIAGNOSIS — Z5181 Encounter for therapeutic drug level monitoring: Secondary | ICD-10-CM

## 2019-01-18 DIAGNOSIS — Z952 Presence of prosthetic heart valve: Secondary | ICD-10-CM

## 2019-01-18 DIAGNOSIS — I4891 Unspecified atrial fibrillation: Secondary | ICD-10-CM

## 2019-01-18 LAB — POCT INR
INR: 1.9 — AB (ref 2.0–3.0)
INR: 2.2 (ref 2.0–3.0)

## 2019-01-18 NOTE — Patient Instructions (Signed)
INR 2.2 Continue coumadin 1 1/2 tablet daily except 2 tablets on Tuesdays Recheck in 4 weeks.

## 2019-01-19 DIAGNOSIS — K219 Gastro-esophageal reflux disease without esophagitis: Secondary | ICD-10-CM | POA: Diagnosis not present

## 2019-01-19 DIAGNOSIS — J189 Pneumonia, unspecified organism: Secondary | ICD-10-CM | POA: Diagnosis not present

## 2019-02-02 DIAGNOSIS — I10 Essential (primary) hypertension: Secondary | ICD-10-CM | POA: Diagnosis not present

## 2019-02-02 DIAGNOSIS — E039 Hypothyroidism, unspecified: Secondary | ICD-10-CM | POA: Diagnosis not present

## 2019-02-02 DIAGNOSIS — M5136 Other intervertebral disc degeneration, lumbar region: Secondary | ICD-10-CM | POA: Diagnosis not present

## 2019-02-02 DIAGNOSIS — G2581 Restless legs syndrome: Secondary | ICD-10-CM | POA: Diagnosis not present

## 2019-02-02 DIAGNOSIS — K219 Gastro-esophageal reflux disease without esophagitis: Secondary | ICD-10-CM | POA: Diagnosis not present

## 2019-02-02 DIAGNOSIS — R0981 Nasal congestion: Secondary | ICD-10-CM | POA: Diagnosis not present

## 2019-02-02 DIAGNOSIS — I38 Endocarditis, valve unspecified: Secondary | ICD-10-CM | POA: Diagnosis not present

## 2019-02-02 DIAGNOSIS — I509 Heart failure, unspecified: Secondary | ICD-10-CM | POA: Diagnosis not present

## 2019-02-03 ENCOUNTER — Telehealth: Payer: Self-pay | Admitting: *Deleted

## 2019-02-03 NOTE — Telephone Encounter (Signed)
Medications and allergies reviewed with patient. Unable to give names an doses of medications, says her son, manages her medications. Son called to review meds but no answer. [place]. Aware to check BP, HR and weight prior to visit  No new lab work or hospitalizations outside of [facility] since last visit  Patient verbally consented for a telehealth visits with [facility] and understands that her insurance company will be billed for the encounter.

## 2019-02-03 NOTE — Progress Notes (Signed)
Virtual Visit via Telephone Note   This visit type was conducted due to national recommendations for restrictions regarding the COVID-19 Pandemic (e.g. social distancing) in an effort to limit this patient's exposure and mitigate transmission in our community.  Due to her co-morbid illnesses, this patient is at least at moderate risk for complications without adequate follow up.  This format is felt to be most appropriate for this patient at this time.  The patient did not have access to video technology/had technical difficulties with video requiring transitioning to audio format only (telephone).  All issues noted in this document were discussed and addressed.  No physical exam could be performed with this format.  Please refer to the patient's chart for her  consent to telehealth for [facility].   Evaluation Performed:  Follow-up visit  Date:  02/04/2019   ID:  [name], DOB 1951/01/23, MRN [id number]  Patient Location: Home Provider Location: Home  PCP:  Octavio Graves, DO  Cardiologist:  Rozann Lesches, MD  Chief Complaint:  Follow-up diastolic heart failure  History of Present Illness:    [name] is a 68 y.o. female last seen in December 2019.  We could not connect via video despite two separate attempts and ended up speaking by phone.  She was seen in the ER in March and received IV diuresis for fluid overload.  She states that she feels better at this point, her weight is down 10 pounds and has maintained with use of as needed Zaroxolyn on one occasion.  Her baseline Lasix regimen is now 160 mg in the morning and 80 mg in the afternoon.  She continues on Coumadin with follow-up in the anticoagulation clinic, last INR was 2.2.  She denies any spontaneous bleeding problems.  Creatinine in March was up to 1.67.  Chest x-ray from March reported stable cardiomegaly with central pulmonary vascular congestion and mild bibasilar subsegmental atelectasis.  The patient  does not have symptoms concerning for COVID-19 infection (fever, chills, cough, or new shortness of breath).  She states that she has been social distancing, stays at home.   Past Medical History:  Diagnosis Date  . Acute on chronic diastolic (congestive) heart failure ([place])   . Anxiety disorder   . Aortic stenosis    Status post St. Jude mechanical AVR 2007  . Asthma   . Atrial fibrillation ([place])   . Carcinoid tumor of colon 2007  . Coronary atherosclerosis of native coronary artery    Status post CABG 2007  . GERD (gastroesophageal reflux disease)   . History of colonoscopy 2003   Dr. Gala Romney - normal  . Hyperlipidemia   . Macromastia   . Pedal edema    Bilateral, chronic  . Peptic stricture of esophagus 10/04/2010   GE junction on last EGD by Dr. Gala Romney, benign biopsies  . RLS (restless legs syndrome)   . Schatzki's ring    Past Surgical History:  Procedure Laterality Date  . ABDOMINAL HYSTERECTOMY    . AORTIC VALVE REPLACEMENT  2007   #25 mm St. 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 APPENDECTOMY  2007  . BACK SURGERY     lumbar 4 and 5   . BIOPSY  02/05/2012   RMR:Two tongues of salmon-colored epithelium distal esophagus, very short-segment Barrett's s/p bx/Small hiatal hernia, otherwise normal stomach, D1, D2. Status post esophageal dilation. Biopsy showed GERD.  . Breast cyst removed     bilateral  .  BREAST REDUCTION SURGERY    . CESAREAN SECTION    . 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 COLONOSCOPY  02/05/2012   FIE:PPIRJJ rectum, sigmoid diverticulosis,descending colon polyp , tubular adenoma  . CORONARY ARTERY BYPASS GRAFT     06/2006 - RIMA to RCA, SVG to RCA  . ESOPHAGOGASTRODUODENOSCOPY  10/03/2010   Dr. Gala Romney- schatzki's ring, shoft peptic stricture at GE junction.  . ESOPHAGOGASTRODUODENOSCOPY (EGD) WITH PROPOFOL N/A 02/08/2018   Procedure: ESOPHAGOGASTRODUODENOSCOPY (EGD) WITH PROPOFOL;   Surgeon: Daneil Dolin, MD;  Location: [facility];  Service: Endoscopy;  Laterality: N/A;  8:15am  . FOOT SURGERY     bilateral bunionectomy  . HERNIA REPAIR     with mesh  . LAPAROTOMY  2007   small bowel resection secondary to small bowel obstruction  . MALONEY DILATION  02/05/2012   Procedure: Venia Minks DILATION;  Surgeon: Daneil Dolin, MD;  Location: [facility] ORS;  Service: Endoscopy;  Laterality: N/A;  76mm   . MALONEY DILATION N/A 02/08/2018   Procedure: Venia Minks DILATION;  Surgeon: Daneil Dolin, MD;  Location: [facility];  Service: Endoscopy;  Laterality: N/A;  . Teeth removal       Current Meds  Medication Sig  . allopurinol (ZYLOPRIM) 100 MG tablet Take 100 mg by mouth daily.  Marland Kitchen ALPRAZolam (XANAX) 0.25 MG tablet Take 1 tablet (0.25 mg total) by mouth 2 (two) times daily as needed for anxiety. (Patient taking differently: Take 1 mg by mouth 2 (two) times daily as needed for anxiety. )  . bisoprolol (ZEBETA) 10 MG tablet Take 1 tablet by mouth daily.  . budesonide-formoterol (SYMBICORT) 160-4.5 MCG/ACT inhaler Inhale 2 puffs into the lungs 2 (two) times daily.  . cetirizine (ZYRTEC) 10 MG tablet Take 10 mg by mouth daily.    . Coenzyme Q10 (CO Q 10 PO) Take 1 capsule by mouth daily.  . cyclobenzaprine (FLEXERIL) 10 MG tablet Take 10 mg by mouth daily as needed for muscle spasms.  Marland Kitchen diltiazem (CARDIZEM CD) 120 MG 24 hr capsule Take 1 capsule (120 mg total) by mouth daily.  . fluticasone (FLONASE) 50 MCG/ACT nasal spray Place 2 sprays into both nostrils daily.  . furosemide (LASIX) 80 MG tablet Take 80 mg by mouth as directed. Takes 160 mg am, 80 mg pm  . HYDROcodone-acetaminophen (NORCO) 10-325 MG tablet Take 1 tablet by mouth daily as needed for moderate pain. Normally takes one tablet at bedtime for back pain  . hydrOXYzine (ATARAX/VISTARIL) 25 MG tablet Take 1 tablet by mouth 3 (three) times daily as needed.  . Ipratropium-Albuterol (COMBIVENT) 20-100 MCG/ACT AERS respimat  Inhale 1-2 puffs into the lungs every 6 (six) hours as needed for wheezing or shortness of breath.  . levothyroxine (SYNTHROID, LEVOTHROID) 50 MCG tablet Take 50 mcg by mouth daily before breakfast.   . magnesium oxide (MAG-OX) 400 MG tablet Take 400 mg by mouth daily.  . metolazone (ZAROXOLYN) 2.5 MG tablet Take 1 tablet (2.5 mg total) by mouth as directed. May take 30 minutes before Lasix if you gain more than 3 lbs in 24 hours  . Multiple Vitamin (MULTIVITAMIN WITH MINERALS) TABS tablet Take 1 tablet by mouth daily.  Marland Kitchen omeprazole (PRILOSEC) 20 MG capsule Take 20 mg by mouth 2 (two) times daily before a meal.  . OXYGEN Inhale 2 L into the lungs daily.   . potassium chloride (KLOR-CON) 20 MEQ packet Take 20 mEq by mouth daily.   Marland Kitchen  rOPINIRole (REQUIP) 3 MG tablet Take 3 mg by mouth 2 (two) times daily as needed (for Restless leg syndrome).   . warfarin (COUMADIN) 2 MG tablet Take 1.5-2 tablets (3-4 mg total) by mouth See admin instructions. 4mg  on Tuesday and 3 mg daily the rest of the week.  . [DISCONTINUED] metolazone (ZAROXOLYN) 2.5 MG tablet Take 2.5 mg by mouth as directed. May take 30 minutes before Lasix if you gain more than 3 lbs in 24 hours     Allergies:   Penicillins   Social History   Tobacco Use  . Smoking status: Former Smoker    Packs/day: 1.00    Years: 20.00    Pack years: 20.00    Types: Cigarettes    Last attempt to quit: 10/20/1988    Years since quitting: 30.3  . Smokeless tobacco: Never Used  Substance Use Topics  . Alcohol use: No    Alcohol/week: 0.0 standard drinks  . Drug use: No     Family Hx: The patient's family history includes Cancer in her father; Stroke in her mother. There is no history of Anesthesia problems, Hypotension, Malignant hyperthermia, or Pseudochol deficiency.  ROS:   Please see the history of present illness.    Chronic hearing loss. All other systems reviewed and are negative.   Prior CV studies:   The following studies were  reviewed today:  Echocardiogram 07/27/2018: Study Conclusions  - Left ventricle: The cavity size was normal. Wall thickness was   increased in a pattern of mild LVH. Systolic function was normal.   The estimated ejection fraction was in the range of 60% to 65%.   Wall motion was normal; there were no regional wall motion   abnormalities. The study was not technically sufficient to allow   evaluation of LV diastolic dysfunction due to atrial   fibrillation. - Aortic valve: St. Jude mechanical prosthesis in aortic position.   There was no significant regurgitation. Mean gradient (S): 6 mm   Hg. Peak gradient (S): 11 mm Hg. Normal prosthetic function based   on gradients. - Mitral valve: Moderately calcified annulus. There was mild   regurgitation directed eccentrically. - Left atrium: The atrium was severely dilated. - Right ventricle: The cavity size was moderately dilated. Systolic   function was mildly reduced. - Right atrium: The atrium was at the upper limits of normal in   size. Central venous pressure (est): 15 mm Hg. - Atrial septum: No defect or patent foramen ovale was identified. - Tricuspid valve: There was mild-moderate regurgitation. - Pulmonary arteries: PA peak pressure: 39 mm Hg (S). - Pericardium, extracardiac: There was no pericardial effusion.  Labs/Other Tests and Data Reviewed:    EKG: I personally reviewed the tracing from 12/28/2018 which showed rate controlled atrial fibrillation with diffuse nonspecific ST-T changes.  Recent Labs: 08/01/2018: Magnesium 2.1 09/02/2018: ALT 13 12/28/2018: B Natriuretic Peptide 652.0; BUN 26; Creatinine, Ser 1.67; Hemoglobin 9.4; Platelets 195; Potassium 3.0; Sodium 135    Wt Readings from Last 3 Encounters:  02/04/19 176 lb (79.8 kg)  12/28/18 186 lb 4.8 oz (84.5 kg)  10/08/18 178 lb 6.4 oz (80.9 kg)     Objective:    Vital Signs:  BP 94/61   Pulse 61   Ht 5\' 8"  (1.727 m)   Wt 176 lb (79.8 kg)   BMI 26.76 kg/m     Patient answered questions spontaneously by phone. Normal voice tone and speech pattern. Patient was not breathless while speaking in full sentences.  ASSESSMENT & PLAN:    1.  Chronic diastolic heart failure, interval exacerbation noted in March.  Her weight is down 10 pounds at this point and stable in comparison to December 2019.  Continue current Lasix dose at 160 mg in the morning and 80 mg in the afternoon.  She also has metolazone to use as needed.  Refill provided.  Follow-up with BMET for next visit.  2.  CKD stage III, last creatinine 1.67.  3.  Aortic valve disease status post St. Jude mechanical AVR.  She continues on Coumadin with follow-up in anticoagulation clinic, last INR therapeutic.  4.  CABG in 2007.  She denies any active angina symptoms at this time.  COVID-19 Education: The signs and symptoms of COVID-19 were discussed with the patient and how to seek care for testing (follow up with PCP or arrange E-visit).  The importance of social distancing was discussed today.  Time:   Today, I have spent 9 minutes with the patient with telehealth technology discussing the above problems.     Medication Adjustments/Labs and Tests Ordered: Current medicines are reviewed at length with the patient today.  Concerns regarding medicines are outlined above.   Tests Ordered: Orders Placed This Encounter  Procedures  . Basic Metabolic Panel (BMET)    Medication Changes: Meds ordered this encounter  Medications  . metolazone (ZAROXOLYN) 2.5 MG tablet    Sig: Take 1 tablet (2.5 mg total) by mouth as directed. May take 30 minutes before Lasix if you gain more than 3 lbs in 24 hours    Dispense:  30 tablet    Refill:  1    Disposition:  Follow up 3 months with BMET.  Signed, Rozann Lesches, MD  02/04/2019 1:28 PM    [place]

## 2019-02-03 NOTE — Telephone Encounter (Signed)
Son contacted to review medications and says he is not at home but will call back to review medications when he is back at home.

## 2019-02-03 NOTE — Telephone Encounter (Signed)
son returned your call.    He said that he would try to let his mom know that you are trying to get in touch with her

## 2019-02-04 ENCOUNTER — Telehealth (INDEPENDENT_AMBULATORY_CARE_PROVIDER_SITE_OTHER): Payer: Medicare HMO | Admitting: Cardiology

## 2019-02-04 ENCOUNTER — Encounter: Payer: Self-pay | Admitting: Cardiology

## 2019-02-04 VITALS — BP 94/61 | HR 61 | Ht 68.0 in | Wt 176.0 lb

## 2019-02-04 DIAGNOSIS — N183 Chronic kidney disease, stage 3 unspecified: Secondary | ICD-10-CM

## 2019-02-04 DIAGNOSIS — I5032 Chronic diastolic (congestive) heart failure: Secondary | ICD-10-CM

## 2019-02-04 DIAGNOSIS — I4821 Permanent atrial fibrillation: Secondary | ICD-10-CM

## 2019-02-04 DIAGNOSIS — Z7901 Long term (current) use of anticoagulants: Secondary | ICD-10-CM

## 2019-02-04 DIAGNOSIS — Z952 Presence of prosthetic heart valve: Secondary | ICD-10-CM

## 2019-02-04 DIAGNOSIS — Z7189 Other specified counseling: Secondary | ICD-10-CM

## 2019-02-04 DIAGNOSIS — I25708 Atherosclerosis of coronary artery bypass graft(s), unspecified, with other forms of angina pectoris: Secondary | ICD-10-CM | POA: Diagnosis not present

## 2019-02-04 MED ORDER — METOLAZONE 2.5 MG PO TABS: 2.5000 mg | ORAL_TABLET | ORAL | 1 refills | Status: DC

## 2019-02-04 NOTE — Patient Instructions (Signed)
Medication Instructions: Your physician recommends that you continue on your current medications as directed. Please refer to the Current Medication list given to you today.   Labwork: bmet  JUST BEFORE next visit  Procedures/Testing:  None today  Follow-Up:  6 months with Dr.McDowell  Any Additional Special Instructions Will Be Listed Below (If Applicable).     If you need a refill on your cardiac medications before your next appointment, please call your pharmacy.

## 2019-02-15 ENCOUNTER — Ambulatory Visit: Payer: Medicare HMO | Admitting: *Deleted

## 2019-02-15 DIAGNOSIS — Z5181 Encounter for therapeutic drug level monitoring: Secondary | ICD-10-CM

## 2019-02-15 DIAGNOSIS — I4891 Unspecified atrial fibrillation: Secondary | ICD-10-CM

## 2019-02-15 DIAGNOSIS — Z952 Presence of prosthetic heart valve: Secondary | ICD-10-CM | POA: Diagnosis not present

## 2019-02-15 LAB — POCT INR: INR: 1.7 — AB (ref 2.0–3.0)

## 2019-02-15 NOTE — Patient Instructions (Signed)
Take coumadin 3 tablets tonight then increase dose to 1 1/2 tablet daily except 2 tablets on Tuesdays and Fridays Recheck in 4 weeks.

## 2019-02-17 DIAGNOSIS — Z791 Long term (current) use of non-steroidal anti-inflammatories (NSAID): Secondary | ICD-10-CM | POA: Diagnosis not present

## 2019-02-17 DIAGNOSIS — J441 Chronic obstructive pulmonary disease with (acute) exacerbation: Secondary | ICD-10-CM | POA: Diagnosis not present

## 2019-02-17 DIAGNOSIS — I5033 Acute on chronic diastolic (congestive) heart failure: Secondary | ICD-10-CM | POA: Diagnosis not present

## 2019-03-08 DIAGNOSIS — I10 Essential (primary) hypertension: Secondary | ICD-10-CM | POA: Diagnosis not present

## 2019-03-08 DIAGNOSIS — M5136 Other intervertebral disc degeneration, lumbar region: Secondary | ICD-10-CM | POA: Diagnosis not present

## 2019-03-08 DIAGNOSIS — I38 Endocarditis, valve unspecified: Secondary | ICD-10-CM | POA: Diagnosis not present

## 2019-03-08 DIAGNOSIS — E039 Hypothyroidism, unspecified: Secondary | ICD-10-CM | POA: Diagnosis not present

## 2019-03-15 ENCOUNTER — Ambulatory Visit (INDEPENDENT_AMBULATORY_CARE_PROVIDER_SITE_OTHER): Payer: Medicare HMO | Admitting: *Deleted

## 2019-03-15 ENCOUNTER — Other Ambulatory Visit: Payer: Self-pay

## 2019-03-15 DIAGNOSIS — Z5181 Encounter for therapeutic drug level monitoring: Secondary | ICD-10-CM

## 2019-03-15 DIAGNOSIS — I4891 Unspecified atrial fibrillation: Secondary | ICD-10-CM | POA: Diagnosis not present

## 2019-03-15 DIAGNOSIS — Z952 Presence of prosthetic heart valve: Secondary | ICD-10-CM

## 2019-03-15 LAB — POCT INR: INR: 2.1 (ref 2.0–3.0)

## 2019-03-15 NOTE — Patient Instructions (Signed)
Continue coumadin 1 1/2 tablet daily except 2 tablets on Tuesdays and Fridays Recheck in 4 weeks.

## 2019-03-20 DIAGNOSIS — J441 Chronic obstructive pulmonary disease with (acute) exacerbation: Secondary | ICD-10-CM | POA: Diagnosis not present

## 2019-03-20 DIAGNOSIS — Z791 Long term (current) use of non-steroidal anti-inflammatories (NSAID): Secondary | ICD-10-CM | POA: Diagnosis not present

## 2019-03-20 DIAGNOSIS — I5033 Acute on chronic diastolic (congestive) heart failure: Secondary | ICD-10-CM | POA: Diagnosis not present

## 2019-03-22 ENCOUNTER — Telehealth: Payer: Self-pay

## 2019-03-22 DIAGNOSIS — I4821 Permanent atrial fibrillation: Secondary | ICD-10-CM | POA: Diagnosis not present

## 2019-03-22 DIAGNOSIS — I5032 Chronic diastolic (congestive) heart failure: Secondary | ICD-10-CM | POA: Diagnosis not present

## 2019-03-22 DIAGNOSIS — E876 Hypokalemia: Secondary | ICD-10-CM

## 2019-03-22 NOTE — Telephone Encounter (Signed)
Received call from [facility] lab that patient had Potassium of 2.6  there fax has not been able to go through to [name]   BMET as follows:     Sodium 135  Potassium 2.6  BUN 35   Creat 1.64  GFR  31  Calcium 9.8  Chloride 87  C O2  33.4   Glucose 177      Will send to DOD

## 2019-03-23 NOTE — Telephone Encounter (Signed)
Give 80 meq KCl now and increase KCl to 40 meq daily starting tomorrow. Check BMET tomorrow.

## 2019-03-24 MED ORDER — POTASSIUM CHLORIDE CRYS ER 20 MEQ PO TBCR: 40.0000 meq | EXTENDED_RELEASE_TABLET | Freq: Every day | ORAL | 3 refills | Status: DC

## 2019-03-24 NOTE — Telephone Encounter (Signed)
I spoke with patient, she will take potassium 80 meq today and then 40 meq daily thereafter, she will go to [facility] for repeat labs,I will fax lab slip

## 2019-03-24 NOTE — Addendum Note (Signed)
Addended by: Barbarann Ehlers A on: 03/24/2019 09:21 AM   Modules accepted: Orders

## 2019-03-25 DIAGNOSIS — E876 Hypokalemia: Secondary | ICD-10-CM | POA: Diagnosis not present

## 2019-03-28 ENCOUNTER — Encounter: Payer: Self-pay | Admitting: *Deleted

## 2019-03-29 ENCOUNTER — Telehealth: Payer: Self-pay | Admitting: *Deleted

## 2019-03-29 DIAGNOSIS — E876 Hypokalemia: Secondary | ICD-10-CM

## 2019-03-29 NOTE — Telephone Encounter (Signed)
-----   Message from Satira Sark, MD sent at 03/28/2019  4:05 PM EDT ----- Results reviewed.  Recently with fairly significant hypokalemia, potassium supplementation was increased by Dr. Bronson Ing, results referred to Dr. Harl Bowie for some reason.  Her potassium is still low at 3.0.  Please increase KCl to 60 mEq daily.  Her Lasix dose is fairly high. Repeat BMET in 1 week.

## 2019-03-31 NOTE — Telephone Encounter (Signed)
Spoke with son, to have patient contact office.

## 2019-04-01 MED ORDER — POTASSIUM CHLORIDE CRYS ER 20 MEQ PO TBCR: 60.0000 meq | EXTENDED_RELEASE_TABLET | Freq: Every day | ORAL | Status: DC

## 2019-04-01 NOTE — Telephone Encounter (Signed)
Notes recorded by Laurine Blazer, LPN on 06/19/6741 at 5:52 AM EDT  Patient notified. Copy to pmd. She will repeat lab on 04/08/19 at [facility]. Lab order faxed today.  ------   Notes recorded by Merlene Laughter, RN on 03/31/2019 at 2:42 PM EDT  Left message for patient to call office.  ------   Notes recorded by Merlene Laughter, RN on 03/29/2019 at 9:49 AM EDT  Left message for patient to call office.  ------

## 2019-04-07 ENCOUNTER — Inpatient Hospital Stay (HOSPITAL_COMMUNITY)
Admission: EM | Admit: 2019-04-07 | Discharge: 2019-04-11 | DRG: 291 | Disposition: A | Discharge status: Home Health | Payer: Medicare HMO | Attending: Internal Medicine | Admitting: Internal Medicine

## 2019-04-07 ENCOUNTER — Other Ambulatory Visit: Payer: Self-pay

## 2019-04-07 ENCOUNTER — Emergency Department (HOSPITAL_COMMUNITY): Payer: Medicare HMO

## 2019-04-07 ENCOUNTER — Encounter (HOSPITAL_COMMUNITY): Payer: Self-pay | Admitting: Emergency Medicine

## 2019-04-07 DIAGNOSIS — I482 Chronic atrial fibrillation, unspecified: Secondary | ICD-10-CM | POA: Diagnosis present

## 2019-04-07 DIAGNOSIS — G2581 Restless legs syndrome: Secondary | ICD-10-CM | POA: Diagnosis present

## 2019-04-07 DIAGNOSIS — I2510 Atherosclerotic heart disease of native coronary artery without angina pectoris: Secondary | ICD-10-CM | POA: Diagnosis present

## 2019-04-07 DIAGNOSIS — Z9981 Dependence on supplemental oxygen: Secondary | ICD-10-CM

## 2019-04-07 DIAGNOSIS — I509 Heart failure, unspecified: Secondary | ICD-10-CM

## 2019-04-07 DIAGNOSIS — D649 Anemia, unspecified: Secondary | ICD-10-CM | POA: Diagnosis present

## 2019-04-07 DIAGNOSIS — K219 Gastro-esophageal reflux disease without esophagitis: Secondary | ICD-10-CM | POA: Diagnosis present

## 2019-04-07 DIAGNOSIS — Z7951 Long term (current) use of inhaled steroids: Secondary | ICD-10-CM

## 2019-04-07 DIAGNOSIS — J45901 Unspecified asthma with (acute) exacerbation: Secondary | ICD-10-CM | POA: Diagnosis not present

## 2019-04-07 DIAGNOSIS — Z87891 Personal history of nicotine dependence: Secondary | ICD-10-CM

## 2019-04-07 DIAGNOSIS — Z1159 Encounter for screening for other viral diseases: Secondary | ICD-10-CM

## 2019-04-07 DIAGNOSIS — R05 Cough: Secondary | ICD-10-CM | POA: Diagnosis not present

## 2019-04-07 DIAGNOSIS — Z79890 Hormone replacement therapy (postmenopausal): Secondary | ICD-10-CM

## 2019-04-07 DIAGNOSIS — M549 Dorsalgia, unspecified: Secondary | ICD-10-CM | POA: Diagnosis present

## 2019-04-07 DIAGNOSIS — Z7901 Long term (current) use of anticoagulants: Secondary | ICD-10-CM

## 2019-04-07 DIAGNOSIS — I130 Hypertensive heart and chronic kidney disease with heart failure and stage 1 through stage 4 chronic kidney disease, or unspecified chronic kidney disease: Secondary | ICD-10-CM | POA: Diagnosis not present

## 2019-04-07 DIAGNOSIS — Z952 Presence of prosthetic heart valve: Secondary | ICD-10-CM

## 2019-04-07 DIAGNOSIS — I5031 Acute diastolic (congestive) heart failure: Secondary | ICD-10-CM | POA: Diagnosis not present

## 2019-04-07 DIAGNOSIS — Z20828 Contact with and (suspected) exposure to other viral communicable diseases: Secondary | ICD-10-CM | POA: Diagnosis not present

## 2019-04-07 DIAGNOSIS — I5042 Chronic combined systolic (congestive) and diastolic (congestive) heart failure: Secondary | ICD-10-CM | POA: Diagnosis not present

## 2019-04-07 DIAGNOSIS — N183 Chronic kidney disease, stage 3 (moderate): Secondary | ICD-10-CM | POA: Diagnosis not present

## 2019-04-07 DIAGNOSIS — J9621 Acute and chronic respiratory failure with hypoxia: Secondary | ICD-10-CM | POA: Diagnosis present

## 2019-04-07 DIAGNOSIS — M109 Gout, unspecified: Secondary | ICD-10-CM | POA: Diagnosis present

## 2019-04-07 DIAGNOSIS — E785 Hyperlipidemia, unspecified: Secondary | ICD-10-CM | POA: Diagnosis present

## 2019-04-07 DIAGNOSIS — N179 Acute kidney failure, unspecified: Secondary | ICD-10-CM | POA: Diagnosis not present

## 2019-04-07 DIAGNOSIS — Z90710 Acquired absence of both cervix and uterus: Secondary | ICD-10-CM

## 2019-04-07 DIAGNOSIS — J9601 Acute respiratory failure with hypoxia: Secondary | ICD-10-CM | POA: Diagnosis present

## 2019-04-07 DIAGNOSIS — I5023 Acute on chronic systolic (congestive) heart failure: Secondary | ICD-10-CM | POA: Diagnosis not present

## 2019-04-07 DIAGNOSIS — Z951 Presence of aortocoronary bypass graft: Secondary | ICD-10-CM

## 2019-04-07 DIAGNOSIS — E039 Hypothyroidism, unspecified: Secondary | ICD-10-CM | POA: Diagnosis not present

## 2019-04-07 DIAGNOSIS — I5033 Acute on chronic diastolic (congestive) heart failure: Secondary | ICD-10-CM | POA: Diagnosis present

## 2019-04-07 DIAGNOSIS — J449 Chronic obstructive pulmonary disease, unspecified: Secondary | ICD-10-CM | POA: Diagnosis present

## 2019-04-07 DIAGNOSIS — F419 Anxiety disorder, unspecified: Secondary | ICD-10-CM | POA: Diagnosis present

## 2019-04-07 DIAGNOSIS — G8929 Other chronic pain: Secondary | ICD-10-CM | POA: Diagnosis present

## 2019-04-07 DIAGNOSIS — I110 Hypertensive heart disease with heart failure: Secondary | ICD-10-CM | POA: Diagnosis not present

## 2019-04-07 DIAGNOSIS — Z79899 Other long term (current) drug therapy: Secondary | ICD-10-CM

## 2019-04-07 DIAGNOSIS — R0602 Shortness of breath: Secondary | ICD-10-CM | POA: Diagnosis not present

## 2019-04-07 LAB — BASIC METABOLIC PANEL
Anion gap: 10 (ref 5–15)
BUN: 48 mg/dL — ABNORMAL HIGH (ref 8–23)
CO2: 27 mmol/L (ref 22–32)
Calcium: 8.9 mg/dL (ref 8.9–10.3)
Chloride: 102 mmol/L (ref 98–111)
Creatinine, Ser: 2.19 mg/dL — ABNORMAL HIGH (ref 0.44–1.00)
GFR calc Af Amer: 26 mL/min — ABNORMAL LOW (ref >60)
GFR calc non Af Amer: 23 mL/min — ABNORMAL LOW (ref >60)
Glucose, Bld: 96 mg/dL (ref 70–99)
Potassium: 5.1 mmol/L (ref 3.5–5.1)
Sodium: 139 mmol/L (ref 135–145)

## 2019-04-07 LAB — CBC WITH DIFFERENTIAL/PLATELET
Abs Immature Granulocytes: 0.04 10*3/uL (ref 0.00–0.07)
Basophils Absolute: 0 10*3/uL (ref 0.0–0.1)
Basophils Relative: 0 %
Eosinophils Absolute: 0.1 10*3/uL (ref 0.0–0.5)
Eosinophils Relative: 1 %
HCT: 28.9 % — ABNORMAL LOW (ref 36.0–46.0)
Hemoglobin: 8.6 g/dL — ABNORMAL LOW (ref 12.0–15.0)
Immature Granulocytes: 0 %
Lymphocytes Relative: 15 %
Lymphs Abs: 1.4 10*3/uL (ref 0.7–4.0)
MCH: 27 pg (ref 26.0–34.0)
MCHC: 29.8 g/dL — ABNORMAL LOW (ref 30.0–36.0)
MCV: 90.6 fL (ref 80.0–100.0)
Monocytes Absolute: 0.9 10*3/uL (ref 0.1–1.0)
Monocytes Relative: 10 %
Neutro Abs: 6.6 10*3/uL (ref 1.7–7.7)
Neutrophils Relative %: 74 %
Platelets: 220 10*3/uL (ref 150–400)
RBC: 3.19 MIL/uL — ABNORMAL LOW (ref 3.87–5.11)
RDW: 16.2 % — ABNORMAL HIGH (ref 11.5–15.5)
WBC: 9 10*3/uL (ref 4.0–10.5)
nRBC: 0 % (ref 0.0–0.2)

## 2019-04-07 LAB — SARS CORONAVIRUS 2 BY RT PCR (HOSPITAL ORDER, PERFORMED IN ~~LOC~~ HOSPITAL LAB): SARS Coronavirus 2: NEGATIVE

## 2019-04-07 LAB — PROTIME-INR
INR: 3.4 — ABNORMAL HIGH (ref 0.8–1.2)
Prothrombin Time: 33.6 seconds — ABNORMAL HIGH (ref 11.4–15.2)

## 2019-04-07 LAB — TROPONIN I
Troponin I: <0.03 ng/mL (ref <0.03)
Troponin I: <0.03 ng/mL (ref <0.03)

## 2019-04-07 LAB — BRAIN NATRIURETIC PEPTIDE: B Natriuretic Peptide: 735 pg/mL — ABNORMAL HIGH (ref 0.0–100.0)

## 2019-04-07 MED ORDER — CYCLOBENZAPRINE HCL 10 MG PO TABS
10.0000 mg | ORAL_TABLET | Freq: Every day | ORAL | Status: DC | PRN
  Administered 2019-04-08: 04:00:00 10 mg via ORAL
  Filled 2019-04-07: qty 1

## 2019-04-07 MED ORDER — SODIUM CHLORIDE 0.9 % IV SOLN: 250.0000 mL | INTRAVENOUS | Status: DC | PRN

## 2019-04-07 MED ORDER — FUROSEMIDE 10 MG/ML IJ SOLN
80.0000 mg | Freq: Two times a day (BID) | INTRAMUSCULAR | Status: DC
  Administered 2019-04-08 – 2019-04-10 (×5): 80 mg via INTRAVENOUS
  Filled 2019-04-07 (×5): qty 8

## 2019-04-07 MED ORDER — FUROSEMIDE 10 MG/ML IJ SOLN
20.0000 mg | Freq: Once | INTRAMUSCULAR | Status: AC
  Administered 2019-04-07: 20 mg via INTRAVENOUS
  Filled 2019-04-07: qty 2

## 2019-04-07 MED ORDER — HYDROXYZINE HCL 25 MG PO TABS
25.0000 mg | ORAL_TABLET | Freq: Three times a day (TID) | ORAL | Status: DC | PRN
  Administered 2019-04-08: 09:00:00 25 mg via ORAL
  Filled 2019-04-07: qty 1

## 2019-04-07 MED ORDER — LORATADINE 10 MG PO TABS
10.0000 mg | ORAL_TABLET | Freq: Every day | ORAL | Status: DC
  Administered 2019-04-08 – 2019-04-11 (×4): 10 mg via ORAL
  Filled 2019-04-07 (×4): qty 1

## 2019-04-07 MED ORDER — MOMETASONE FURO-FORMOTEROL FUM 200-5 MCG/ACT IN AERO
2.0000 | INHALATION_SPRAY | Freq: Two times a day (BID) | RESPIRATORY_TRACT | Status: DC
  Administered 2019-04-08 – 2019-04-11 (×6): 2 via RESPIRATORY_TRACT
  Filled 2019-04-07 (×2): qty 8.8

## 2019-04-07 MED ORDER — POTASSIUM CHLORIDE CRYS ER 20 MEQ PO TBCR
60.0000 meq | EXTENDED_RELEASE_TABLET | Freq: Every day | ORAL | Status: DC
  Administered 2019-04-08 – 2019-04-11 (×4): 60 meq via ORAL
  Filled 2019-04-07 (×4): qty 3

## 2019-04-07 MED ORDER — ALLOPURINOL 100 MG PO TABS
100.0000 mg | ORAL_TABLET | Freq: Every day | ORAL | Status: DC
  Administered 2019-04-08 – 2019-04-11 (×4): 100 mg via ORAL
  Filled 2019-04-07 (×4): qty 1

## 2019-04-07 MED ORDER — SODIUM CHLORIDE 0.9% FLUSH
3.0000 mL | INTRAVENOUS | Status: DC | PRN
  Administered 2019-04-09: 08:00:00 3 mL via INTRAVENOUS
  Filled 2019-04-07: qty 3

## 2019-04-07 MED ORDER — BISOPROLOL FUMARATE 5 MG PO TABS
10.0000 mg | ORAL_TABLET | Freq: Every day | ORAL | Status: DC
  Administered 2019-04-08 – 2019-04-11 (×4): 10 mg via ORAL
  Filled 2019-04-07 (×4): qty 2

## 2019-04-07 MED ORDER — FLUTICASONE PROPIONATE 50 MCG/ACT NA SUSP
2.0000 | Freq: Every day | NASAL | Status: DC
  Administered 2019-04-08 – 2019-04-11 (×4): 2 via NASAL
  Filled 2019-04-07: qty 16

## 2019-04-07 MED ORDER — MAGNESIUM OXIDE 400 (241.3 MG) MG PO TABS
400.0000 mg | ORAL_TABLET | Freq: Every day | ORAL | Status: DC
  Administered 2019-04-08 – 2019-04-11 (×4): 400 mg via ORAL
  Filled 2019-04-07 (×4): qty 1

## 2019-04-07 MED ORDER — LEVOTHYROXINE SODIUM 50 MCG PO TABS
50.0000 ug | ORAL_TABLET | Freq: Every day | ORAL | Status: DC
  Administered 2019-04-08 – 2019-04-11 (×4): 50 ug via ORAL
  Filled 2019-04-07 (×4): qty 1

## 2019-04-07 MED ORDER — ACETAMINOPHEN 325 MG PO TABS
650.0000 mg | ORAL_TABLET | Freq: Four times a day (QID) | ORAL | Status: DC | PRN
  Administered 2019-04-08 (×2): 650 mg via ORAL
  Filled 2019-04-07 (×2): qty 2

## 2019-04-07 MED ORDER — ACETAMINOPHEN 650 MG RE SUPP: 650.0000 mg | Freq: Four times a day (QID) | RECTAL | Status: DC | PRN

## 2019-04-07 MED ORDER — ROPINIROLE HCL 1 MG PO TABS
3.0000 mg | ORAL_TABLET | Freq: Two times a day (BID) | ORAL | Status: DC | PRN
  Administered 2019-04-08 – 2019-04-10 (×5): 3 mg via ORAL
  Filled 2019-04-07 (×5): qty 3

## 2019-04-07 MED ORDER — ALPRAZOLAM 0.5 MG PO TABS
0.5000 mg | ORAL_TABLET | Freq: Two times a day (BID) | ORAL | Status: DC | PRN
  Administered 2019-04-08 – 2019-04-10 (×5): 0.5 mg via ORAL
  Filled 2019-04-07 (×6): qty 1

## 2019-04-07 MED ORDER — SODIUM CHLORIDE 0.9% FLUSH
3.0000 mL | Freq: Two times a day (BID) | INTRAVENOUS | Status: DC
  Administered 2019-04-09 – 2019-04-11 (×4): 3 mL via INTRAVENOUS

## 2019-04-07 MED ORDER — DILTIAZEM HCL ER COATED BEADS 120 MG PO CP24
120.0000 mg | ORAL_CAPSULE | Freq: Every day | ORAL | Status: DC
  Administered 2019-04-08 – 2019-04-11 (×4): 120 mg via ORAL
  Filled 2019-04-07 (×4): qty 1

## 2019-04-07 MED ORDER — PANTOPRAZOLE SODIUM 40 MG PO TBEC
40.0000 mg | DELAYED_RELEASE_TABLET | Freq: Every day | ORAL | Status: DC
  Administered 2019-04-08 – 2019-04-11 (×4): 40 mg via ORAL
  Filled 2019-04-07 (×4): qty 1

## 2019-04-07 NOTE — Progress Notes (Signed)
ANTICOAGULATION CONSULT NOTE - Initial Consult  Pharmacy Consult for warfarin Indication: AVR and atrial fibrillation  Allergies  Allergen Reactions  . Penicillins Hives, Itching, Swelling and Rash    Has patient had a PCN reaction causing immediate rash, facial/tongue/throat swelling, SOB or lightheadedness with hypotension: Yes Has patient had a PCN reaction causing severe rash involving mucus membranes or skin necrosis: Yes Has patient had a PCN reaction that required hospitalization: unknown Has patient had a PCN reaction occurring within the last 10 years: No If all of the above answers are "NO", then may proceed with Cephalosporin use.    Throat swelling    Patient Measurements: Height: 5\' 9"  (175.3 cm) Weight: 199 lb 3.2 oz (90.4 kg) IBW/kg (Calculated) : 66.2   Vital Signs: Temp: 98.1 F (36.7 C) (06/18 1800) Temp Source: Oral (06/18 1800) BP: 107/85 (06/18 2200) Pulse Rate: 70 (06/18 2200)  Labs: Recent Labs    04/07/19 1819  HGB 8.6*  HCT 28.9*  PLT 220  LABPROT 33.6*  INR 3.4*  CREATININE 2.19*  TROPONINI <0.03    Estimated Creatinine Clearance: 29.9 mL/min (A) (by C-G formula based on SCr of 2.19 mg/dL (H)).   Medical History: Past Medical History:  Diagnosis Date  . Acute on chronic diastolic (congestive) heart failure ([place])   . Anxiety disorder   . Aortic stenosis    Status post St. Jude mechanical AVR 2007  . Asthma   . Atrial fibrillation ([place])   . Carcinoid tumor of colon 2007  . Coronary atherosclerosis of native coronary artery    Status post CABG 2007  . GERD (gastroesophageal reflux disease)   . History of colonoscopy 2003   Dr. Gala Romney - normal  . Hyperlipidemia   . Macromastia   . Pedal edema    Bilateral, chronic  . Peptic stricture of esophagus 10/04/2010   GE junction on last EGD by Dr. Gala Romney, benign biopsies  . RLS (restless legs syndrome)   . Schatzki's ring     Medications:  (Not in a hospital  admission)   Assessment: Pharmacy consulted to dose warfarin in patient with aortic St Jude mechanical valve replacement and history of atrial fibrillation.  INR on admission is 3.4.  Home dose of warfarin listed as 4 mg on Tuesday and 3 mg ROW although per current med rec patient unsure of regimen.  Pharmacy will need to verify dosing when possible.  Goal of Therapy:  INR 2-3 Monitor platelets by anticoagulation protocol: Yes   Plan:  Hold warfarin x 1 dose. Monitor daily INR and s/s of [place].  [name] 04/07/2019,10:22 PM

## 2019-04-07 NOTE — ED Provider Notes (Signed)
[facility] EMERGENCY DEPARTMENT Provider Note   CSN: [id number] Arrival date & time: 04/07/19  1751    History   Chief Complaint Chief Complaint  Patient presents with  . Shortness of Breath    HPI [name] is a 68 y.o. female with past medical history of CHF, aortic valve replacement, and A. Fib on coumadin, hyperlipidemia presents emergency department today with chief complaint of shortness of breath x2 days.  States shortness of breath is worse with exertion.  She reports associated productive cough with yellow sputum.  She admits to being compliant with medications including Lasix.  Patient wears 2 L nasal cannula when sleeping.  She states she has been having to wear nasal cannula more often since symptoms onset.  Denies fever, chills, chest pain, lower extremity edema, diarrhea, bloody stool, nausea, vomiting.  History provided by patient with additional history obtained from chart review.    Past Medical History:  Diagnosis Date  . Acute on chronic diastolic (congestive) heart failure ([place])   . Anxiety disorder   . Aortic stenosis    Status post St. Jude mechanical AVR 2007  . Asthma   . Atrial fibrillation ([place])   . Carcinoid tumor of colon 2007  . Coronary atherosclerosis of native coronary artery    Status post CABG 2007  . GERD (gastroesophageal reflux disease)   . History of colonoscopy 2003   Dr. Gala Romney - normal  . Hyperlipidemia   . Macromastia   . Pedal edema    Bilateral, chronic  . Peptic stricture of esophagus 10/04/2010   GE junction on last EGD by Dr. Gala Romney, benign biopsies  . RLS (restless legs syndrome)   . Schatzki's ring     Patient Active Problem List   Diagnosis Date Noted  . Acute CHF ([place]) 04/07/2019  . Benign essential HTN   . CHF (congestive heart failure) ([place]) 07/26/2018  . Transaminitis 07/23/2018  . Acute on chronic respiratory failure with hypoxia and hypercapnia ([place]) 06/19/2018  . Acute on chronic respiratory failure with  hypoxia ([place])   . Supratherapeutic INR 05/03/2018  . CHF exacerbation ([place]) 05/02/2018  . Acute metabolic encephalopathy 94/17/4081  . CKD (chronic kidney disease) stage 3, GFR 30-59 ml/min (HCC) 05/02/2018  . Hypothyroidism 05/02/2018  . Generalized weakness 05/02/2018  . Physical deconditioning   . Coronary artery disease of bypass graft of native heart with stable angina pectoris ([place])   . Renal failure (ARF), acute on chronic (HCC)   . Acute on chronic respiratory failure with hypercapnia ([place]) 03/24/2018  . Hypomagnesemia 03/24/2018  . Hypokalemia 03/24/2018  . Hypophosphatemia 03/24/2018  . Atrial fibrillation with RVR ([place]) 03/24/2018  . Elevated troponin 03/24/2018  . Coughing 01/14/2018  . Anemia 01/14/2018  . Hx of adenomatous colonic polyps 01/14/2018  . Acute on chronic diastolic congestive heart failure ([place]) 12/17/2016  . COPD with acute exacerbation ([place]) 12/17/2016  . Bilateral leg edema 07/05/2014  . Encounter for therapeutic drug monitoring 11/15/2013  . Chronic venous insufficiency 08/26/2011  . Chronic anticoagulation 01/11/2011  . Peptic stricture of esophagus 10/04/2010  . GERD 09/16/2010  . DYSPHAGIA 09/16/2010  . H/O heart valve replacement with mechanical valve 06/05/2009  . Hyperlipidemia 06/04/2009  . Coronary atherosclerosis of native coronary artery 06/04/2009  . Atrial fibrillation, chronic 06/04/2009    Past Surgical History:  Procedure Laterality Date  . ABDOMINAL HYSTERECTOMY    . AORTIC VALVE REPLACEMENT  2007   #25 mm St. Jude mechanical prosthesis with Hemashield tube graft  repair of ascending aneurysm  . APPENDECTOMY  2007  . BACK SURGERY     lumbar 4 and 5   . BIOPSY  02/05/2012   RMR:Two tongues of salmon-colored epithelium distal esophagus, very short-segment Barrett's s/p bx/Small hiatal hernia, otherwise normal stomach, D1, D2. Status post esophageal dilation. Biopsy showed GERD.  . Breast cyst removed     bilateral  . BREAST  REDUCTION SURGERY    . CESAREAN SECTION    . 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 COLONOSCOPY  02/05/2012   KGU:RKYHCW rectum, sigmoid diverticulosis,descending colon polyp , tubular adenoma  . CORONARY ARTERY BYPASS GRAFT     06/2006 - RIMA to RCA, SVG to RCA  . ESOPHAGOGASTRODUODENOSCOPY  10/03/2010   Dr. Gala Romney- schatzki's ring, shoft peptic stricture at GE junction.  . ESOPHAGOGASTRODUODENOSCOPY (EGD) WITH PROPOFOL N/A 02/08/2018   Procedure: ESOPHAGOGASTRODUODENOSCOPY (EGD) WITH PROPOFOL;  Surgeon: Daneil Dolin, MD;  Location: [facility] ENDO SUITE;  Service: Endoscopy;  Laterality: N/A;  8:15am  . FOOT SURGERY     bilateral bunionectomy  . HERNIA REPAIR     with mesh  . LAPAROTOMY  2007   small bowel resection secondary to small bowel obstruction  . MALONEY DILATION  02/05/2012   Procedure: Venia Minks DILATION;  Surgeon: Daneil Dolin, MD;  Location: [facility] ORS;  Service: Endoscopy;  Laterality: N/A;  1mm   . MALONEY DILATION N/A 02/08/2018   Procedure: Venia Minks DILATION;  Surgeon: Daneil Dolin, MD;  Location: [facility] ENDO SUITE;  Service: Endoscopy;  Laterality: N/A;  . Teeth removal       OB History    Gravida  3   Para  3   Term  3   Preterm      AB      Living  1     SAB      TAB      Ectopic      Multiple      Live Births               Home Medications    Prior to Admission medications   Medication Sig Start Date End Date Taking? Authorizing Provider  allopurinol (ZYLOPRIM) 100 MG tablet Take 100 mg by mouth daily.    [provider]  ALPRAZolam Duanne Moron) 0.25 MG tablet Take 1 tablet (0.25 mg total) by mouth 2 (two) times daily as needed for anxiety. Patient taking differently: Take 1 mg by mouth 2 (two) times daily as needed for anxiety.  05/05/18   Johnson, Clanford L, MD  bisoprolol (ZEBETA) 10 MG tablet Take 1 tablet by mouth daily. 10/05/18   [provider]  budesonide-formoterol (SYMBICORT) 160-4.5 MCG/ACT inhaler  Inhale 2 puffs into the lungs 2 (two) times daily. 03/31/18   Barton Dubois, MD  cetirizine (ZYRTEC) 10 MG tablet Take 10 mg by mouth daily.      [provider]  Coenzyme Q10 (CO Q 10 PO) Take 1 capsule by mouth daily.    [provider]  cyclobenzaprine (FLEXERIL) 10 MG tablet Take 10 mg by mouth daily as needed for muscle spasms.    [provider]  diltiazem (CARDIZEM CD) 120 MG 24 hr capsule Take 1 capsule (120 mg total) by mouth daily. 04/01/18   Barton Dubois, MD  fluticasone (FLONASE) 50 MCG/ACT nasal spray Place 2 sprays into both nostrils daily. 01/19/19   [provider]  furosemide (LASIX) 80 MG tablet Take 80 mg by mouth  as directed. Takes 160 mg am, 80 mg pm    [provider]  HYDROcodone-acetaminophen (NORCO) 10-325 MG tablet Take 1 tablet by mouth daily as needed for moderate pain. Normally takes one tablet at bedtime for back pain    [provider]  hydrOXYzine (ATARAX/VISTARIL) 25 MG tablet Take 1 tablet by mouth 3 (three) times daily as needed. 12/02/18   [provider]  Ipratropium-Albuterol (COMBIVENT) 20-100 MCG/ACT AERS respimat Inhale 1-2 puffs into the lungs every 6 (six) hours as needed for wheezing or shortness of breath. 09/05/18   Barton Dubois, MD  levothyroxine (SYNTHROID, LEVOTHROID) 50 MCG tablet Take 50 mcg by mouth daily before breakfast.     [provider]  magnesium oxide (MAG-OX) 400 MG tablet Take 400 mg by mouth daily.    [provider]  metolazone (ZAROXOLYN) 2.5 MG tablet Take 1 tablet (2.5 mg total) by mouth as directed. May take 30 minutes before Lasix if you gain more than 3 lbs in 24 hours 02/04/19   Satira Sark, MD  Multiple Vitamin (MULTIVITAMIN WITH MINERALS) TABS tablet Take 1 tablet by mouth daily.    [provider]  omeprazole (PRILOSEC) 20 MG capsule Take 20 mg by mouth 2 (two) times daily before a meal.    [provider]  OXYGEN Inhale 2 L  into the lungs daily.     [provider]  potassium chloride SA (K-DUR) 20 MEQ tablet Take 3 tablets (60 mEq total) by mouth daily. 04/01/19   Satira Sark, MD  rOPINIRole (REQUIP) 3 MG tablet Take 3 mg by mouth 2 (two) times daily as needed (for Restless leg syndrome).     [provider]  warfarin (COUMADIN) 2 MG tablet Take 1.5-2 tablets (3-4 mg total) by mouth See admin instructions. 4mg  on Tuesday and 3 mg daily the rest of the week. 09/06/18   Barton Dubois, MD    Family History Family History  Problem Relation Age of Onset  . Stroke Mother   . Cancer Father   . Anesthesia problems Neg Hx   . Hypotension Neg Hx   . Malignant hyperthermia Neg Hx   . Pseudochol deficiency Neg Hx     Social History Social History   Tobacco Use  . Smoking status: Former Smoker    Packs/day: 1.00    Years: 20.00    Pack years: 20.00    Types: Cigarettes    Quit date: 10/20/1988    Years since quitting: 30.4  . Smokeless tobacco: Never Used  Substance Use Topics  . Alcohol use: No    Alcohol/week: 0.0 standard drinks  . Drug use: No     Allergies   Penicillins   Review of Systems Review of Systems  Constitutional: Negative for chills and fever.  HENT: Negative for congestion, ear discharge, ear pain, sinus pressure, sinus pain and sore throat.   Eyes: Negative for pain and redness.  Respiratory: Positive for cough and shortness of breath.   Cardiovascular: Negative for chest pain.  Gastrointestinal: Negative for abdominal pain, constipation, diarrhea, nausea and vomiting.  Genitourinary: Negative for dysuria and hematuria.  Musculoskeletal: Negative for back pain and neck pain.  Skin: Negative for wound.  Neurological: Negative for weakness, numbness and headaches.     Physical Exam Updated Vital Signs BP 107/85   Pulse 70   Temp 98.1 F (36.7 C) (Oral)   Resp (!) 25   Ht 5\' 9"  (1.753 m)   Wt 90.4 kg  SpO2 95%   BMI 29.42 kg/m   Physical Exam  Vitals signs and nursing note reviewed.  Constitutional:      General: She is not in acute distress.    Appearance: She is not ill-appearing.  HENT:     Head: Normocephalic and atraumatic.     Right Ear: Tympanic membrane and external ear normal.     Left Ear: Tympanic membrane and external ear normal.     Nose: Nose normal.     Mouth/Throat:     Mouth: Mucous membranes are moist.     Pharynx: Oropharynx is clear.  Eyes:     General: No scleral icterus.       Right eye: No discharge.        Left eye: No discharge.     Extraocular Movements: Extraocular movements intact.     Conjunctiva/sclera: Conjunctivae normal.     Pupils: Pupils are equal, round, and reactive to light.  Neck:     Musculoskeletal: Normal range of motion.     Vascular: JVD present.  Cardiovascular:     Rate and Rhythm: Normal rate. Rhythm irregular.     Pulses: Normal pulses.          Radial pulses are 2+ on the right side and 2+ on the left side.       Dorsalis pedis pulses are 2+ on the right side and 2+ on the left side.     Heart sounds: Normal heart sounds.  Pulmonary:     Comments: Patient with increased work of breathing.  No accessory muscle use.  SpO2 on room air is 89%.  Rales heard throughout all lung fields. Abdominal:     Comments: Abdomen is soft, non-distended, and non-tender in all quadrants. No rigidity, no guarding. No peritoneal signs.  Musculoskeletal: Normal range of motion.     Right lower leg: 1+ Pitting Edema present.     Left lower leg: 1+ Pitting Edema present.  Skin:    General: Skin is warm and dry.     Capillary Refill: Capillary refill takes less than 2 seconds.  Neurological:     Mental Status: She is oriented to person, place, and time.     GCS: GCS eye subscore is 4. GCS verbal subscore is 5. GCS motor subscore is 6.     Comments: Fluent speech, no facial droop.  Psychiatric:        Behavior: Behavior normal.      ED Treatments / Results  Labs (all labs ordered are  listed, but only abnormal results are displayed) Labs Reviewed  BASIC METABOLIC PANEL - Abnormal; Notable for the following components:      Result Value   BUN 48 (*)    Creatinine, Ser 2.19 (*)    GFR calc non Af Amer 23 (*)    GFR calc Af Amer 26 (*)    All other components within normal limits  BRAIN NATRIURETIC PEPTIDE - Abnormal; Notable for the following components:   B Natriuretic Peptide 735.0 (*)    All other components within normal limits  CBC WITH DIFFERENTIAL/PLATELET - Abnormal; Notable for the following components:   RBC 3.19 (*)    Hemoglobin 8.6 (*)    HCT 28.9 (*)    MCHC 29.8 (*)    RDW 16.2 (*)    All other components within normal limits  PROTIME-INR - Abnormal; Notable for the following components:   Prothrombin Time 33.6 (*)    INR 3.4 (*)  All other components within normal limits  SARS CORONAVIRUS 2 (HOSPITAL ORDER, [place] [facility])  TROPONIN I  COMPREHENSIVE METABOLIC PANEL  CBC  TROPONIN I  TROPONIN I  TSH    EKG EKG Interpretation  Date/Time:  Thursday April 07 2019 18:05:27 EDT Ventricular Rate:  73 PR Interval:    QRS Duration: 97 QT Interval:  473 QTC Calculation: 522 R Axis:   69 Text Interpretation:  Atrial fibrillation Low voltage, precordial leads Borderline repolarization abnormality Prolonged QT interval Confirmed by Julianne Rice [phone]) on 04/07/2019 10:55:28 PM   Radiology Dg Chest Port 1 View  Result Date: 04/07/2019 CLINICAL DATA:  Shortness of breath, cough EXAM: PORTABLE CHEST 1 VIEW COMPARISON:  12/28/2018 FINDINGS: Cardiomegaly. Prior median sternotomy and valve replacement. Mild pulmonary vascular congestion and interstitial prominence could reflect interstitial edema. No effusions or acute bony abnormality. IMPRESSION: Cardiomegaly with vascular congestion and interstitial prominence, question interstitial edema. Electronically Signed   By: Rolm Baptise M.D.   On: 04/07/2019 19:26    Procedures  Procedures (including critical care time)  Medications Ordered in ED Medications  sodium chloride flush (NS) 0.9 % injection 3 mL (has no administration in time range)  sodium chloride flush (NS) 0.9 % injection 3 mL (has no administration in time range)  0.9 %  sodium chloride infusion (has no administration in time range)  acetaminophen (TYLENOL) tablet 650 mg (has no administration in time range)    Or  acetaminophen (TYLENOL) suppository 650 mg (has no administration in time range)  furosemide (LASIX) injection 80 mg (has no administration in time range)  furosemide (LASIX) injection 20 mg (20 mg Intravenous Given 04/07/19 2023)     Initial Impression / Assessment and Plan / ED Course  I have reviewed the triage vital signs and the nursing notes.  Pertinent labs & imaging results that were available during my care of the patient were reviewed by me and considered in my medical decision making (see chart for details).  Pt is tachypneic and hypoxic to 88% on room air. She is normotensive, not tachycardic. Rales heard throughout all lung fields. Bilateral 1+ pitting edema and mild JVD, suggest volume overload. INR 3.4.  BNP elevated to 735, it was 652 x 3 months ago. BMP shows worsening renal function with BUN/creatinine 2.19. Compared to x3 months ago BUN/Creatinine 26/1.67. Troponin negative. Chest xray viewed by me shows cardiomegaly with vascular congestion.  Gave low dose IV lasix given elevated creatinine and pt had minimal urine output afterwards. I ambulated pt and on her home rate 2L was 89%.  This case was discussed with Dr. Lita Mains who has seen the patient and agrees with plan to admit. Spoke with Dr. Maudie Mercury with hospitalist service who agrees to assume care of patient and bring into the hospital for further evaluation and management.    This note was prepared using Dragon voice recognition software and may include unintentional dictation errors due to the inherent limitations of  voice recognition software.   Final Clinical Impressions(s) / ED Diagnoses   Final diagnoses:  COPD exacerbation ([place])  AKI (acute kidney injury) [facility])    ED Discharge Orders    None       Cherre Robins, PA-C 04/08/19 0414    Julianne Rice, MD 04/12/19 1651

## 2019-04-07 NOTE — H&P (Addendum)
TRH H&P    Patient Demographics:    [name], is a 68 y.o. female  MRN: [id number]  DOB - 12/19/1950  Admit Date - 04/07/2019  Referring MD/NP/PA: Emeterio Reeve  Outpatient Primary MD for the patient is Octavio Graves, DO  Patient coming from: home  Chief complaint- dyspnea   HPI:    [name]  is a 68 y.o. female, h/o Aortic stenosis s/p AVR 2007(St Jude), CAD s/p CABG, Pafib, CKD stage3, Asthma, presents with c/o dyspnea x 2 days, slight cough, mostly dry. + wheezing.  Pt denies fever, chills, cp, palp, n/v, abd pain, diarrhea, brbpr, black stool, orthopnea, pnd, lower ext edema.  Pt uses o2 2L [employer] at home baseline.  Pt denies medication, noncompliance   In ED,  T 98.1, P 71  R 23, Bp 102/61  Pox 95% on RA,  88% with exertion Wt 90.4kg  (prev 80.9kg 10/08/2018, 87.3 kg 09/02/2018)  CXR IMPRESSION: Cardiomegaly with vascular congestion and interstitial prominence, question interstitial edema.  covid -19 negative Trop <0.03 BNP 735    ekg Afib at 70, nl axis, no st-t changes c/w ischemia  Wbc 9.0, Hgb 8.6, Plt 220 Na 139, K 5.1, Bun 48, Creatinine 2.19   INR 3.4 Glucose 96  Pt given lasix 20mg  iv x1 in ED  Pt will be admitted for acute hypoxic respiratory failure secondary to asthma exacerbation, and mild acute CHF.       Review of systems:    In addition to the HPI above,  No Fever-chills, No Headache, No changes with Vision or hearing, No problems swallowing food or Liquids, No Chest pain, Cough or Shortness of Breath, No Abdominal pain, No Nausea or Vomiting, bowel movements are regular, No Blood in stool or Urine, No dysuria, No new skin rashes or bruises, No new joints pains-aches,  No new weakness, tingling, numbness in any extremity, No recent weight gain or loss, No polyuria, polydypsia or polyphagia, No significant Mental Stressors.  All other systems  reviewed and are negative.    Past History of the following :    Past Medical History:  Diagnosis Date  . Acute on chronic diastolic (congestive) heart failure ([place])   . Anxiety disorder   . Aortic stenosis    Status post St. Jude mechanical AVR 2007  . Asthma   . Atrial fibrillation ([place])   . Carcinoid tumor of colon 2007  . Coronary atherosclerosis of native coronary artery    Status post CABG 2007  . GERD (gastroesophageal reflux disease)   . History of colonoscopy 2003   Dr. Gala Romney - normal  . Hyperlipidemia   . Macromastia   . Pedal edema    Bilateral, chronic  . Peptic stricture of esophagus 10/04/2010   GE junction on last EGD by Dr. Gala Romney, benign biopsies  . RLS (restless legs syndrome)   . Schatzki's ring       Past Surgical History:  Procedure Laterality Date  . ABDOMINAL HYSTERECTOMY    . AORTIC VALVE REPLACEMENT  2007   #25  mm St. 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 APPENDECTOMY  2007  . BACK SURGERY     lumbar 4 and 5   . BIOPSY  02/05/2012   RMR:Two tongues of salmon-colored epithelium distal esophagus, very short-segment Barrett's s/p bx/Small hiatal hernia, otherwise normal stomach, D1, D2. Status post esophageal dilation. Biopsy showed GERD.  . Breast cyst removed     bilateral  . BREAST REDUCTION SURGERY    . CESAREAN SECTION    . 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 COLONOSCOPY  02/05/2012   CBJ:SEGBTD rectum, sigmoid diverticulosis,descending colon polyp , tubular adenoma  . CORONARY ARTERY BYPASS GRAFT     06/2006 - RIMA to RCA, SVG to RCA  . ESOPHAGOGASTRODUODENOSCOPY  10/03/2010   Dr. Gala Romney- schatzki's ring, shoft peptic stricture at GE junction.  . ESOPHAGOGASTRODUODENOSCOPY (EGD) WITH PROPOFOL N/A 02/08/2018   Procedure: ESOPHAGOGASTRODUODENOSCOPY (EGD) WITH PROPOFOL;  Surgeon: Daneil Dolin, MD;  Location: [facility];  Service: Endoscopy;  Laterality: N/A;  8:15am   . FOOT SURGERY     bilateral bunionectomy  . HERNIA REPAIR     with mesh  . LAPAROTOMY  2007   small bowel resection secondary to small bowel obstruction  . MALONEY DILATION  02/05/2012   Procedure: Venia Minks DILATION;  Surgeon: Daneil Dolin, MD;  Location: [facility];  Service: Endoscopy;  Laterality: N/A;  38mm   . MALONEY DILATION N/A 02/08/2018   Procedure: Venia Minks DILATION;  Surgeon: Daneil Dolin, MD;  Location: [facility];  Service: Endoscopy;  Laterality: N/A;  . Teeth removal        Social History:      Social History   Tobacco Use  . Smoking status: Former Smoker    Packs/day: 1.00    Years: 20.00    Pack years: 20.00    Types: Cigarettes    Quit date: 10/20/1988    Years since quitting: 30.4  . Smokeless tobacco: Never Used  Substance Use Topics  . Alcohol use: No    Alcohol/week: 0.0 standard drinks       Family History :     Family History  Problem Relation Age of Onset  . Stroke Mother   . Cancer Father   . Anesthesia problems Neg Hx   . Hypotension Neg Hx   . Malignant hyperthermia Neg Hx   . Pseudochol deficiency Neg Hx        Home Medications:   Prior to Admission medications   Medication Sig Start Date End Date Taking? Authorizing Provider  allopurinol (ZYLOPRIM) 100 MG tablet Take 100 mg by mouth daily.    [provider]  ALPRAZolam Duanne Moron) 0.25 MG tablet Take 1 tablet (0.25 mg total) by mouth 2 (two) times daily as needed for anxiety. Patient taking differently: Take 1 mg by mouth 2 (two) times daily as needed for anxiety.  05/05/18   Johnson, Clanford L, MD  bisoprolol (ZEBETA) 10 MG tablet Take 1 tablet by mouth daily. 10/05/18   [provider]  budesonide-formoterol (SYMBICORT) 160-4.5 MCG/ACT inhaler Inhale 2 puffs into the lungs 2 (two) times daily. 03/31/18   Barton Dubois, MD  cetirizine (ZYRTEC) 10 MG tablet Take 10 mg by mouth daily.      [provider]  Coenzyme Q10 (CO Q 10 PO) Take 1 capsule by mouth  daily.    [provider]  cyclobenzaprine (FLEXERIL) 10 MG tablet Take 10 mg  by mouth daily as needed for muscle spasms.    [provider]  diltiazem (CARDIZEM CD) 120 MG 24 hr capsule Take 1 capsule (120 mg total) by mouth daily. 04/01/18   Barton Dubois, MD  fluticasone (FLONASE) 50 MCG/ACT nasal spray Place 2 sprays into both nostrils daily. 01/19/19   [provider]  furosemide (LASIX) 80 MG tablet Take 80 mg by mouth as directed. Takes 160 mg am, 80 mg pm    [provider]  HYDROcodone-acetaminophen (NORCO) 10-325 MG tablet Take 1 tablet by mouth daily as needed for moderate pain. Normally takes one tablet at bedtime for back pain    [provider]  hydrOXYzine (ATARAX/VISTARIL) 25 MG tablet Take 1 tablet by mouth 3 (three) times daily as needed. 12/02/18   [provider]  Ipratropium-Albuterol (COMBIVENT) 20-100 MCG/ACT AERS respimat Inhale 1-2 puffs into the lungs every 6 (six) hours as needed for wheezing or shortness of breath. 09/05/18   Barton Dubois, MD  levothyroxine (SYNTHROID, LEVOTHROID) 50 MCG tablet Take 50 mcg by mouth daily before breakfast.     [provider]  magnesium oxide (MAG-OX) 400 MG tablet Take 400 mg by mouth daily.    [provider]  metolazone (ZAROXOLYN) 2.5 MG tablet Take 1 tablet (2.5 mg total) by mouth as directed. May take 30 minutes before Lasix if you gain more than 3 lbs in 24 hours 02/04/19   Satira Sark, MD  Multiple Vitamin (MULTIVITAMIN WITH MINERALS) TABS tablet Take 1 tablet by mouth daily.    [provider]  omeprazole (PRILOSEC) 20 MG capsule Take 20 mg by mouth 2 (two) times daily before a meal.    [provider]  OXYGEN Inhale 2 L into the lungs daily.     [provider]  potassium chloride SA (K-DUR) 20 MEQ tablet Take 3 tablets (60 mEq total) by mouth daily. 04/01/19   Satira Sark, MD  rOPINIRole (REQUIP) 3 MG tablet Take 3 mg by  mouth 2 (two) times daily as needed (for Restless leg syndrome).     [provider]  warfarin (COUMADIN) 2 MG tablet Take 1.5-2 tablets (3-4 mg total) by mouth See admin instructions. 4mg  on Tuesday and 3 mg daily the rest of the week. 09/06/18   Barton Dubois, MD     Allergies:     Allergies  Allergen Reactions  . Penicillins Hives, Itching, Swelling and Rash    Has patient had a PCN reaction causing immediate rash, facial/tongue/throat swelling, SOB or lightheadedness with hypotension: Yes Has patient had a PCN reaction causing severe rash involving mucus membranes or skin necrosis: Yes Has patient had a PCN reaction that required hospitalization: unknown Has patient had a PCN reaction occurring within the last 10 years: No If all of the above answers are "NO", then may proceed with Cephalosporin use.    Throat swelling     Physical Exam:   Vitals  Blood pressure 97/64, pulse 64, temperature 98.1 F (36.7 C), temperature source Oral, resp. rate (!) 24, height 5\' 9"  (1.753 m), weight 86.5 kg, SpO2 98 %.  1.  General: axox3  2. Psychiatric: euthymic  3. Neurologic: cn2-12 intact, reflexes 2+symmetric, diffuse with no clonus Motor 5/5 in all 4ext  4. HEENMT:  Anicteric, pupils 1.35mm symmetric, direct, consensual, near intact Neck: trace jvd  5. Respiratory : Bilateral exp wheezing, trace crackles in the right lung base  6. Cardiovascular : rrr s1, s2, + click, 2/6 sem  LLSB  7. Gastrointestinal:  Abd: soft, obese, nt, nd, +bs  8. Skin:  Ext: no c/c, + trace edema, no rash  9.Musculoskeletal:  Good ROM     No adenoapthy    Data Review:    CBC Recent Labs  Lab 04/07/19 1819  WBC 9.0  HGB 8.6*  HCT 28.9*  PLT 220  MCV 90.6  MCH 27.0  MCHC 29.8*  RDW 16.2*  LYMPHSABS 1.4  MONOABS 0.9  EOSABS 0.1  BASOSABS 0.0   ------------------------------------------------------------------------------------------------------------------   Results for orders placed or performed during the hospital encounter of 04/07/19 (from the past 48 hour(s))  Basic metabolic panel     Status: Abnormal   Collection Time: 04/07/19  6:19 PM  Result Value Ref Range   Sodium 139 135 - 145 mmol/L   Potassium 5.1 3.5 - 5.1 mmol/L   Chloride 102 98 - 111 mmol/L   CO2 27 22 - 32 mmol/L   Glucose, Bld 96 70 - 99 mg/dL   BUN 48 (H) 8 - 23 mg/dL   Creatinine, Ser 2.19 (H) 0.44 - 1.00 mg/dL   Calcium 8.9 8.9 - 10.3 mg/dL   GFR calc non Af Amer 23 (L) >60 mL/min   GFR calc Af Amer 26 (L) >60 mL/min   Anion gap 10 5 - 15    Comment: Performed at [facility], [address], [place], [place] [address]  Brain natriuretic peptide     Status: Abnormal   Collection Time: 04/07/19  6:19 PM  Result Value Ref Range   B Natriuretic Peptide 735.0 (H) 0.0 - 100.0 pg/mL    Comment: Performed at [facility], [address], [place], [place] [address]  CBC with Differential     Status: Abnormal   Collection Time: 04/07/19  6:19 PM  Result Value Ref Range   WBC 9.0 4.0 - 10.5 K/uL   RBC 3.19 (L) 3.87 - 5.11 MIL/uL   Hemoglobin 8.6 (L) 12.0 - 15.0 g/dL   HCT 28.9 (L) 36.0 - 46.0 %   MCV 90.6 80.0 - 100.0 fL   MCH 27.0 26.0 - 34.0 pg   MCHC 29.8 (L) 30.0 - 36.0 g/dL   RDW 16.2 (H) 11.5 - 15.5 %   Platelets 220 150 - 400 K/uL   nRBC 0.0 0.0 - 0.2 %   Neutrophils Relative % 74 %   Neutro Abs 6.6 1.7 - 7.7 K/uL   Lymphocytes Relative 15 %   Lymphs Abs 1.4 0.7 - 4.0 K/uL   Monocytes Relative 10 %   Monocytes Absolute 0.9 0.1 - 1.0 K/uL   Eosinophils Relative 1 %   Eosinophils Absolute 0.1 0.0 - 0.5 K/uL   Basophils Relative 0 %   Basophils Absolute 0.0 0.0 - 0.1 K/uL   Immature Granulocytes 0 %   Abs Immature Granulocytes 0.04 0.00 - 0.07 K/uL    Comment: Performed at [facility], [address], [place], [place] [address]  Troponin I - ONCE - STAT     Status: None   Collection Time: 04/07/19  6:19 PM  Result Value Ref Range   Troponin I <0.03 <0.03  ng/mL    Comment: Performed at [facility], [address], [place], [place] [address]  Protime-INR     Status: Abnormal   Collection Time: 04/07/19  6:19 PM  Result Value Ref Range   Prothrombin Time 33.6 (H) 11.4 - 15.2 seconds   INR 3.4 (H) 0.8 - 1.2    Comment: (NOTE) INR  goal varies based on device and disease states. Performed at [facility], [address], [place], [place] [address]   SARS Coronavirus 2 ([place] - Performed in [facility] hospital lab), Hosp Order     Status: None   Collection Time: 04/07/19  9:36 PM   Specimen: Nasopharyngeal Swab  Result Value Ref Range   SARS Coronavirus 2 NEGATIVE NEGATIVE    Comment: (NOTE) If result is NEGATIVE SARS-CoV-2 target nucleic acids are NOT DETECTED. The SARS-CoV-2 RNA is generally detectable in upper and lower  respiratory specimens during the acute phase of infection. The lowest  concentration of SARS-CoV-2 viral copies this assay can detect is 250  copies / mL. A negative result does not preclude SARS-CoV-2 infection  and should not be used as the sole basis for treatment or other  patient management decisions.  A negative result may occur with  improper specimen collection / handling, submission of specimen other  than nasopharyngeal swab, presence of viral mutation(s) within the  areas targeted by this assay, and inadequate number of viral copies  (<250 copies / mL). A negative result must be combined with clinical  observations, patient history, and epidemiological information. If result is POSITIVE SARS-CoV-2 target nucleic acids are DETECTED. The SARS-CoV-2 RNA is generally detectable in upper and lower  respiratory specimens dur ing the acute phase of infection.  Positive  results are indicative of active infection with SARS-CoV-2.  Clinical  correlation with patient history and other diagnostic information is  necessary to determine patient infection status.  Positive results do  not rule out bacterial infection  or co-infection with other viruses. If result is PRESUMPTIVE POSTIVE SARS-CoV-2 nucleic acids MAY BE PRESENT.   A presumptive positive result was obtained on the submitted specimen  and confirmed on repeat testing.  While 2019 novel coronavirus  (SARS-CoV-2) nucleic acids may be present in the submitted sample  additional confirmatory testing may be necessary for epidemiological  and / or clinical management purposes  to differentiate between  SARS-CoV-2 and other Sarbecovirus currently known to infect humans.  If clinically indicated additional testing with an alternate test  methodology [id number]) is advised. The SARS-CoV-2 RNA is generally  detectable in upper and lower respiratory sp ecimens during the acute  phase of infection. The expected result is Negative. Fact Sheet for Patients:  StrictlyIdeas.no Fact Sheet for Healthcare Providers: BankingDealers.co.za This test is not yet approved or cleared by the [place] FDA and has been authorized for detection and/or diagnosis of SARS-CoV-2 by FDA under an Emergency Use Authorization (EUA).  This EUA will remain in effect (meaning this test can be used) for the duration of the COVID-19 declaration under Section 564(b)(1) of the Act, 21 U.S.C. section 360bbb-3(b)(1), unless the authorization is terminated or revoked sooner. Performed at [facility], [address] [place], [place] [address]   Troponin I - Now Then Q6H     Status: None   Collection Time: 04/07/19 11:05 PM  Result Value Ref Range   Troponin I <0.03 <0.03 ng/mL    Comment: Performed at [facility], [address] [place], [place] [address]    Chemistries  Recent Labs  Lab 04/07/19 1819  NA 139  K 5.1  CL 102  CO2 27  GLUCOSE 96  BUN 48*  CREATININE 2.19*  CALCIUM 8.9   ------------------------------------------------------------------------------------------------------------------   ------------------------------------------------------------------------------------------------------------------ GFR: Estimated Creatinine Clearance: 29.2 mL/min (A) (by C-G formula based on SCr of 2.19 mg/dL (H)). Liver Function Tests: No results for input(s):  AST, ALT, ALKPHOS, BILITOT, PROT, ALBUMIN in the last 168 hours. No results for input(s): LIPASE, AMYLASE in the last 168 hours. No results for input(s): AMMONIA in the last 168 hours. Coagulation Profile: Recent Labs  Lab 04/07/19 1819  INR 3.4*   Cardiac Enzymes: Recent Labs  Lab 04/07/19 1819 04/07/19 2305  TROPONINI <0.03 <0.03   BNP (last 3 results) No results for input(s): PROBNP in the last 8760 hours. HbA1C: No results for input(s): HGBA1C in the last 72 hours. CBG: No results for input(s): GLUCAP in the last 168 hours. Lipid Profile: No results for input(s): CHOL, HDL, LDLCALC, TRIG, CHOLHDL, LDLDIRECT in the last 72 hours. Thyroid Function Tests: No results for input(s): TSH, T4TOTAL, FREET4, T3FREE, THYROIDAB in the last 72 hours. Anemia Panel: No results for input(s): VITAMINB12, FOLATE, FERRITIN, TIBC, IRON, RETICCTPCT in the last 72 hours.  --------------------------------------------------------------------------------------------------------------- Urine analysis: No results found for: COLORURINE, APPEARANCEUR, LABSPEC, PHURINE, GLUCOSEU, HGBUR, BILIRUBINUR, KETONESUR, PROTEINUR, UROBILINOGEN, NITRITE, LEUKOCYTESUR    Imaging Results:    Dg Chest Port 1 View  Result Date: 04/07/2019 CLINICAL DATA:  Shortness of breath, cough EXAM: PORTABLE CHEST 1 VIEW COMPARISON:  12/28/2018 FINDINGS: Cardiomegaly. Prior median sternotomy and valve replacement. Mild pulmonary vascular congestion and interstitial prominence could reflect interstitial edema. No effusions or acute bony abnormality. IMPRESSION: Cardiomegaly with vascular congestion and interstitial prominence, question interstitial edema.  Electronically Signed   By: Rolm Baptise M.D.   On: 04/07/2019 19:26        Assessment & Plan:    Principal Problem:   Acute respiratory failure with hypoxia (HCC) Active Problems:   Coronary atherosclerosis of native coronary artery   Atrial fibrillation, chronic   Acute on chronic diastolic congestive heart failure (HCC)   Anemia   Renal failure (ARF), acute on chronic (HCC)   Hypothyroidism   Acute CHF (HCC)   Asthma, chronic, unspecified asthma severity, with acute exacerbation   Acute respiratory failure with hypoxia Secondary to Asthma Exacerbation,  mild acute on chronic CHF (diastolic) Solumedrol 80mg  iv q8h Albuterol neb q6h and q6h prn  incruse 1puff qday Cont Symbicort 2puff bid=> Dulera 2puff bid Start Lasix 80mg  iv bid  Mild Acute CHF (EF 60-65%)/CAD/ Afib/  H/o AVR Tele Trop I q6h x3 Check cardiac echo Cont Bisoprolol 10mg  po qday Cont Cardizem CD 120mg  po qday Oral lasix=> Lasix 80mg  iv bid Cont Magnesium oxide 400mg  po qday Coumadin pharmacy to dose  Mild ARF Likely secondary to dehydration Hold Metolazone Use Lasix iv Check cmp in am  Anemia Consider ferritin, iron, tibc, b12, folate, spep, upep Check cbc in am  Hypothyroidism Cont Levothyroxine 50 micrograms po qday  Gerd Cont PPI  Gout Cont Alluprinol 100mg  po qday  Anxiety Cont Xanax 0.5mg  po bid prn  Chronic back pain Cont Flexeril 10mg  po qhs prn  Gerd Cont PPI    DVT Prophylaxis-   Coumadin   AM Labs Ordered, also please review Full Orders  Family Communication: Admission, patients condition and plan of care including tests being ordered have been discussed with the patient  who indicate understanding and agree with the plan and Code Status.  Code Status:  FULL CODE  Admission status: Observation: Based on patients clinical presentation and evaluation of above clinical data, I have made determination that patient meets observation criteria at this time.  Time spent in  minutes : 70   Jani Gravel M.D on 04/08/2019 at 12:01 AM

## 2019-04-07 NOTE — ED Triage Notes (Signed)
Patient reports increasing SOB over the past 2 days. Has hx of CHF.

## 2019-04-08 ENCOUNTER — Inpatient Hospital Stay (HOSPITAL_COMMUNITY): Payer: Medicare HMO

## 2019-04-08 DIAGNOSIS — Z951 Presence of aortocoronary bypass graft: Secondary | ICD-10-CM | POA: Diagnosis not present

## 2019-04-08 DIAGNOSIS — I361 Nonrheumatic tricuspid (valve) insufficiency: Secondary | ICD-10-CM

## 2019-04-08 DIAGNOSIS — Z79890 Hormone replacement therapy (postmenopausal): Secondary | ICD-10-CM | POA: Diagnosis not present

## 2019-04-08 DIAGNOSIS — Z952 Presence of prosthetic heart valve: Secondary | ICD-10-CM | POA: Diagnosis not present

## 2019-04-08 DIAGNOSIS — E785 Hyperlipidemia, unspecified: Secondary | ICD-10-CM | POA: Diagnosis not present

## 2019-04-08 DIAGNOSIS — Z9981 Dependence on supplemental oxygen: Secondary | ICD-10-CM | POA: Diagnosis not present

## 2019-04-08 DIAGNOSIS — I5033 Acute on chronic diastolic (congestive) heart failure: Secondary | ICD-10-CM | POA: Diagnosis not present

## 2019-04-08 DIAGNOSIS — F419 Anxiety disorder, unspecified: Secondary | ICD-10-CM | POA: Diagnosis present

## 2019-04-08 DIAGNOSIS — J9601 Acute respiratory failure with hypoxia: Secondary | ICD-10-CM | POA: Diagnosis not present

## 2019-04-08 DIAGNOSIS — Z7901 Long term (current) use of anticoagulants: Secondary | ICD-10-CM | POA: Diagnosis not present

## 2019-04-08 DIAGNOSIS — Z90710 Acquired absence of both cervix and uterus: Secondary | ICD-10-CM | POA: Diagnosis not present

## 2019-04-08 DIAGNOSIS — E039 Hypothyroidism, unspecified: Secondary | ICD-10-CM | POA: Diagnosis present

## 2019-04-08 DIAGNOSIS — J45901 Unspecified asthma with (acute) exacerbation: Secondary | ICD-10-CM | POA: Diagnosis not present

## 2019-04-08 DIAGNOSIS — I482 Chronic atrial fibrillation, unspecified: Secondary | ICD-10-CM | POA: Diagnosis not present

## 2019-04-08 DIAGNOSIS — I130 Hypertensive heart and chronic kidney disease with heart failure and stage 1 through stage 4 chronic kidney disease, or unspecified chronic kidney disease: Secondary | ICD-10-CM | POA: Diagnosis not present

## 2019-04-08 DIAGNOSIS — G2581 Restless legs syndrome: Secondary | ICD-10-CM | POA: Diagnosis not present

## 2019-04-08 DIAGNOSIS — D649 Anemia, unspecified: Secondary | ICD-10-CM | POA: Diagnosis present

## 2019-04-08 DIAGNOSIS — R0602 Shortness of breath: Secondary | ICD-10-CM | POA: Diagnosis present

## 2019-04-08 DIAGNOSIS — I2510 Atherosclerotic heart disease of native coronary artery without angina pectoris: Secondary | ICD-10-CM | POA: Diagnosis not present

## 2019-04-08 DIAGNOSIS — I371 Nonrheumatic pulmonary valve insufficiency: Secondary | ICD-10-CM

## 2019-04-08 DIAGNOSIS — J9621 Acute and chronic respiratory failure with hypoxia: Secondary | ICD-10-CM | POA: Diagnosis not present

## 2019-04-08 DIAGNOSIS — N183 Chronic kidney disease, stage 3 (moderate): Secondary | ICD-10-CM | POA: Diagnosis present

## 2019-04-08 DIAGNOSIS — N179 Acute kidney failure, unspecified: Secondary | ICD-10-CM | POA: Diagnosis not present

## 2019-04-08 DIAGNOSIS — Z7951 Long term (current) use of inhaled steroids: Secondary | ICD-10-CM | POA: Diagnosis not present

## 2019-04-08 DIAGNOSIS — Z87891 Personal history of nicotine dependence: Secondary | ICD-10-CM | POA: Diagnosis not present

## 2019-04-08 DIAGNOSIS — Z79899 Other long term (current) drug therapy: Secondary | ICD-10-CM | POA: Diagnosis not present

## 2019-04-08 DIAGNOSIS — Z1159 Encounter for screening for other viral diseases: Secondary | ICD-10-CM | POA: Diagnosis not present

## 2019-04-08 DIAGNOSIS — K219 Gastro-esophageal reflux disease without esophagitis: Secondary | ICD-10-CM | POA: Diagnosis present

## 2019-04-08 DIAGNOSIS — I509 Heart failure, unspecified: Secondary | ICD-10-CM

## 2019-04-08 LAB — CBC
HCT: 28.9 % — ABNORMAL LOW (ref 36.0–46.0)
Hemoglobin: 8.6 g/dL — ABNORMAL LOW (ref 12.0–15.0)
MCH: 27 pg (ref 26.0–34.0)
MCHC: 29.8 g/dL — ABNORMAL LOW (ref 30.0–36.0)
MCV: 90.9 fL (ref 80.0–100.0)
Platelets: 195 10*3/uL (ref 150–400)
RBC: 3.18 MIL/uL — ABNORMAL LOW (ref 3.87–5.11)
RDW: 16.4 % — ABNORMAL HIGH (ref 11.5–15.5)
WBC: 6.5 10*3/uL (ref 4.0–10.5)
nRBC: 0 % (ref 0.0–0.2)

## 2019-04-08 LAB — COMPREHENSIVE METABOLIC PANEL
ALT: 19 U/L (ref 0–44)
AST: 31 U/L (ref 15–41)
Albumin: 3.6 g/dL (ref 3.5–5.0)
Alkaline Phosphatase: 94 U/L (ref 38–126)
Anion gap: 13 (ref 5–15)
BUN: 49 mg/dL — ABNORMAL HIGH (ref 8–23)
CO2: 26 mmol/L (ref 22–32)
Calcium: 8.8 mg/dL — ABNORMAL LOW (ref 8.9–10.3)
Chloride: 99 mmol/L (ref 98–111)
Creatinine, Ser: 2.05 mg/dL — ABNORMAL HIGH (ref 0.44–1.00)
GFR calc Af Amer: 28 mL/min — ABNORMAL LOW (ref >60)
GFR calc non Af Amer: 24 mL/min — ABNORMAL LOW (ref >60)
Glucose, Bld: 107 mg/dL — ABNORMAL HIGH (ref 70–99)
Potassium: 4.4 mmol/L (ref 3.5–5.1)
Sodium: 138 mmol/L (ref 135–145)
Total Bilirubin: 1 mg/dL (ref 0.3–1.2)
Total Protein: 7.5 g/dL (ref 6.5–8.1)

## 2019-04-08 LAB — ECHOCARDIOGRAM COMPLETE
Height: 69 in
Weight: 3047.64 oz

## 2019-04-08 LAB — GLUCOSE, CAPILLARY
Glucose-Capillary: 103 mg/dL — ABNORMAL HIGH (ref 70–99)
Glucose-Capillary: 250 mg/dL — ABNORMAL HIGH (ref 70–99)
Glucose-Capillary: 104 mg/dL — ABNORMAL HIGH (ref 70–99)
Glucose-Capillary: 209 mg/dL — ABNORMAL HIGH (ref 70–99)

## 2019-04-08 LAB — PROTIME-INR
INR: 3.1 — ABNORMAL HIGH (ref 0.8–1.2)
Prothrombin Time: 31.5 seconds — ABNORMAL HIGH (ref 11.4–15.2)

## 2019-04-08 LAB — TROPONIN I
Troponin I: <0.03 ng/mL (ref <0.03)
Troponin I: <0.03 ng/mL (ref <0.03)

## 2019-04-08 LAB — TSH: TSH: 5.759 u[IU]/mL — ABNORMAL HIGH (ref 0.350–4.500)

## 2019-04-08 MED ORDER — ALBUTEROL SULFATE (2.5 MG/3ML) 0.083% IN NEBU
2.5000 mg | INHALATION_SOLUTION | Freq: Four times a day (QID) | RESPIRATORY_TRACT | Status: DC
  Administered 2019-04-08 – 2019-04-10 (×9): 2.5 mg via RESPIRATORY_TRACT
  Filled 2019-04-08 (×9): qty 3

## 2019-04-08 MED ORDER — METHYLPREDNISOLONE SODIUM SUCC 125 MG IJ SOLR
80.0000 mg | Freq: Three times a day (TID) | INTRAMUSCULAR | Status: DC
  Administered 2019-04-08 (×2): 80 mg via INTRAVENOUS
  Filled 2019-04-08 (×2): qty 2

## 2019-04-08 MED ORDER — UMECLIDINIUM BROMIDE 62.5 MCG/INH IN AEPB
1.0000 | INHALATION_SPRAY | Freq: Every day | RESPIRATORY_TRACT | Status: DC
  Administered 2019-04-09 – 2019-04-11 (×3): 1 via RESPIRATORY_TRACT
  Filled 2019-04-08: qty 7

## 2019-04-08 MED ORDER — METHYLPREDNISOLONE SODIUM SUCC 125 MG IJ SOLR
80.0000 mg | Freq: Two times a day (BID) | INTRAMUSCULAR | Status: DC
  Administered 2019-04-08 – 2019-04-09 (×2): 80 mg via INTRAVENOUS
  Filled 2019-04-08 (×2): qty 2

## 2019-04-08 MED ORDER — ALBUTEROL SULFATE (2.5 MG/3ML) 0.083% IN NEBU
2.5000 mg | INHALATION_SOLUTION | Freq: Four times a day (QID) | RESPIRATORY_TRACT | Status: DC | PRN
  Administered 2019-04-10: 2.5 mg via RESPIRATORY_TRACT
  Filled 2019-04-08: qty 3

## 2019-04-08 NOTE — Progress Notes (Signed)
PROGRESS NOTE    [name]  DTO:[id number] DOB: 04-06-1951 DOA: 04/07/2019 PCP: Octavio Graves, DO   Brief Narrative:  Per HPI: [name]  is a 68 y.o. female, h/o Aortic stenosis s/p AVR 2007(St Jude), CAD s/p CABG, Pafib, CKD stage3, Asthma, presents with c/o dyspnea x 2 days, slight cough, mostly dry. + wheezing.  Pt denies fever, chills, cp, palp, n/v, abd pain, diarrhea, brbpr, black stool, orthopnea, pnd, lower ext edema.  Pt uses o2 2L [place] at home baseline.  Pt denies medication, noncompliance  Patient was admitted with acute on chronic hypoxemic respiratory failure secondary to asthma exacerbation as well as mild acute CHF.  She appears to be improving on IV steroids, breathing treatments, and Lasix.  Assessment & Plan:   Principal Problem:   Acute respiratory failure with hypoxia (HCC) Active Problems:   Coronary atherosclerosis of native coronary artery   Atrial fibrillation, chronic   Acute on chronic diastolic congestive heart failure (HCC)   Anemia   Renal failure (ARF), acute on chronic (HCC)   Hypothyroidism   Acute CHF (HCC)   Asthma, chronic, unspecified asthma severity, with acute exacerbation   Acute CHF (congestive heart failure) (HCC)   Acute respiratory failure with hypoxia Secondary to Asthma Exacerbation,  mild acute on chronic CHF (diastolic) Solumedrol 80mg  iv q8h to twice daily Albuterol neb q6h and q6h prn  incruse 1puff qday Cont Symbicort 2puff bid=> Dulera 2puff bid Start Lasix 80mg  iv bid  Mild Acute CHF (EF 60-65%)/CAD/ Afib/  H/o AVR Tele Trop I q6h x3 Check cardiac echo Cont Bisoprolol 10mg  po qday Cont Cardizem CD 120mg  po qday Oral lasix=> Lasix 80mg  iv bid Cont Magnesium oxide 400mg  po qday Coumadin pharmacy to dose  Mild ARF-improved Likely secondary to dehydration Hold Metolazone Use Lasix iv Check cmp in am  Anemia-stable Consider ferritin, iron, tibc, b12, folate, spep, upep Check cbc in am  Hypothyroidism  Cont Levothyroxine 50 micrograms po qday  Gerd Cont PPI  Gout Cont Alluprinol 100mg  po qday  Anxiety Cont Xanax 0.5mg  po bid prn  Chronic back pain Cont Flexeril 10mg  po qhs prn  Gerd Cont PPI   DVT prophylaxis: Coumadin Code Status: Full Family Communication: None at bedside Disposition Plan: Continue steroids as well as diuretics and breathing treatments   Consultants:   None  Procedures:   None  Antimicrobials:   None   Subjective: Patient seen and evaluated today with no new acute complaints or concerns. No acute concerns or events noted overnight.  She continues to have ongoing shortness of breath and coughing that appear to be mildly improved with treatment so far.  Objective: Vitals:   04/08/19 0208 04/08/19 0500 04/08/19 0509 04/08/19 0737  BP:   105/76   Pulse:   74   Resp:   18   Temp:   97.7 F (36.5 C)   TempSrc:   Oral   SpO2: 98%  98% 93%  Weight:  86.4 kg    Height:        Intake/Output Summary (Last 24 hours) at 04/08/2019 1202 Last data filed at 04/08/2019 0836 Gross per 24 hour  Intake 240 ml  Output -  Net 240 ml   Filed Weights   04/07/19 1803 04/07/19 2333 04/08/19 0500  Weight: 90.4 kg 86.5 kg 86.4 kg    Examination:  General exam: Appears calm and comfortable  Respiratory system: Clear to auscultation. Respiratory effort normal.  Currently on 3 L nasal cannula.  Wears 2  L at home. Cardiovascular system: S1 & S2 heard, RRR. No JVD, murmurs, rubs, gallops or clicks.  Scant pedal edema with venous stasis ulcers noted bilaterally. Gastrointestinal system: Abdomen is nondistended, soft and nontender. No organomegaly or masses felt. Normal bowel sounds heard. Central nervous system: Alert and oriented. No focal neurological deficits. Extremities: Symmetric 5 x 5 power. Skin: No rashes, lesions or ulcers Psychiatry: Judgement and insight appear normal. Mood & affect appropriate.     Data Reviewed: I have personally  reviewed following labs and imaging studies  CBC: Recent Labs  Lab 04/07/19 1819 04/08/19 0522  WBC 9.0 6.5  NEUTROABS 6.6  --   HGB 8.6* 8.6*  HCT 28.9* 28.9*  MCV 90.6 90.9  PLT 220 814   Basic Metabolic Panel: Recent Labs  Lab 04/07/19 1819 04/08/19 0522  NA 139 138  K 5.1 4.4  CL 102 99  CO2 27 26  GLUCOSE 96 107*  BUN 48* 49*  CREATININE 2.19* 2.05*  CALCIUM 8.9 8.8*   GFR: Estimated Creatinine Clearance: 31.2 mL/min (A) (by C-G formula based on SCr of 2.05 mg/dL (H)). Liver Function Tests: Recent Labs  Lab 04/08/19 0522  AST 31  ALT 19  ALKPHOS 94  BILITOT 1.0  PROT 7.5  ALBUMIN 3.6   No results for input(s): LIPASE, AMYLASE in the last 168 hours. No results for input(s): AMMONIA in the last 168 hours. Coagulation Profile: Recent Labs  Lab 04/07/19 1819 04/08/19 0851  INR 3.4* 3.1*   Cardiac Enzymes: Recent Labs  Lab 04/07/19 1819 04/07/19 2305 04/08/19 0522  TROPONINI <0.03 <0.03 <0.03   BNP (last 3 results) No results for input(s): PROBNP in the last 8760 hours. HbA1C: No results for input(s): HGBA1C in the last 72 hours. CBG: Recent Labs  Lab 04/08/19 0736 04/08/19 1105  GLUCAP 103* 250*   Lipid Profile: No results for input(s): CHOL, HDL, LDLCALC, TRIG, CHOLHDL, LDLDIRECT in the last 72 hours. Thyroid Function Tests: Recent Labs    04/07/19 2305  TSH 5.759*   Anemia Panel: No results for input(s): VITAMINB12, FOLATE, FERRITIN, TIBC, IRON, RETICCTPCT in the last 72 hours. Sepsis Labs: No results for input(s): PROCALCITON, LATICACIDVEN in the last 168 hours.  Recent Results (from the past 240 hour(s))  SARS Coronavirus 2 (CEPHEID - Performed in [facility] hospital lab), Hosp Order     Status: None   Collection Time: 04/07/19  9:36 PM   Specimen: Nasopharyngeal Swab  Result Value Ref Range Status   SARS Coronavirus 2 NEGATIVE NEGATIVE Final    Comment: (NOTE) If result is NEGATIVE SARS-CoV-2 target nucleic acids are NOT  DETECTED. The SARS-CoV-2 RNA is generally detectable in upper and lower  respiratory specimens during the acute phase of infection. The lowest  concentration of SARS-CoV-2 viral copies this assay can detect is 250  copies / mL. A negative result does not preclude SARS-CoV-2 infection  and should not be used as the sole basis for treatment or other  patient management decisions.  A negative result may occur with  improper specimen collection / handling, submission of specimen other  than nasopharyngeal swab, presence of viral mutation(s) within the  areas targeted by this assay, and inadequate number of viral copies  (<250 copies / mL). A negative result must be combined with clinical  observations, patient history, and epidemiological information. If result is POSITIVE SARS-CoV-2 target nucleic acids are DETECTED. The SARS-CoV-2 RNA is generally detectable in upper and lower  respiratory specimens dur ing the acute  phase of infection.  Positive  results are indicative of active infection with SARS-CoV-2.  Clinical  correlation with patient history and other diagnostic information is  necessary to determine patient infection status.  Positive results do  not rule out bacterial infection or co-infection with other viruses. If result is PRESUMPTIVE POSTIVE SARS-CoV-2 nucleic acids MAY BE PRESENT.   A presumptive positive result was obtained on the submitted specimen  and confirmed on repeat testing.  While 2019 novel coronavirus  (SARS-CoV-2) nucleic acids may be present in the submitted sample  additional confirmatory testing may be necessary for epidemiological  and / or clinical management purposes  to differentiate between  SARS-CoV-2 and other Sarbecovirus currently known to infect humans.  If clinically indicated additional testing with an alternate test  methodology [phone]) is advised. The SARS-CoV-2 RNA is generally  detectable in upper and lower respiratory sp ecimens during  the acute  phase of infection. The expected result is Negative. Fact Sheet for Patients:  StrictlyIdeas.no Fact Sheet for Healthcare Providers: BankingDealers.co.za This test is not yet approved or cleared by the [place] FDA and has been authorized for detection and/or diagnosis of SARS-CoV-2 by FDA under an Emergency Use Authorization (EUA).  This EUA will remain in effect (meaning this test can be used) for the duration of the COVID-19 declaration under Section 564(b)(1) of the Act, 21 U.S.C. section 360bbb-3(b)(1), unless the authorization is terminated or revoked sooner. Performed at [facility], [address] [place], [place] [address]          Radiology Studies: Dg Chest [facility] 1 View  Result Date: 04/07/2019 CLINICAL DATA:  Shortness of breath, cough EXAM: PORTABLE CHEST 1 VIEW COMPARISON:  12/28/2018 FINDINGS: Cardiomegaly. Prior median sternotomy and valve replacement. Mild pulmonary vascular congestion and interstitial prominence could reflect interstitial edema. No effusions or acute bony abnormality. IMPRESSION: Cardiomegaly with vascular congestion and interstitial prominence, question interstitial edema. Electronically Signed   By: Rolm Baptise M.D.   On: 04/07/2019 19:26        Scheduled Meds: . albuterol  2.5 mg Nebulization Q6H  . allopurinol  100 mg Oral Daily  . bisoprolol  10 mg Oral Daily  . diltiazem  120 mg Oral Daily  . fluticasone  2 spray Each Nare Daily  . furosemide  80 mg Intravenous BID  . levothyroxine  50 mcg Oral QAC breakfast  . loratadine  10 mg Oral Daily  . magnesium oxide  400 mg Oral Daily  . methylPREDNISolone (SOLU-MEDROL) injection  80 mg Intravenous Q12H  . mometasone-formoterol  2 puff Inhalation BID  . pantoprazole  40 mg Oral Daily  . potassium chloride SA  60 mEq Oral Daily  . sodium chloride flush  3 mL Intravenous Q12H  . umeclidinium bromide  1 puff Inhalation Daily    Continuous Infusions: . sodium chloride       LOS: 0 days    Time spent: 30 minutes    Jerlean Peralta Darleen Crocker, DO [facility] Pager [phone]  If 7PM-7AM, please contact night-coverage [email or web address] Password [email or web address] 04/08/2019, 12:02 PM

## 2019-04-08 NOTE — Progress Notes (Signed)
Patient pulling off nasal cannula, at times forgetting to put back on. Patient becomes short of breath, during these time. Expressed understanding of importance of keeping oxygen on. Patient appears to be anxious prn xanax already given earlier per orders.

## 2019-04-08 NOTE — TOC Initial Note (Signed)
Transition of Care [facility]) - Initial/Assessment Note    Patient Details  Name: [name] MRN: [id number] Date of Birth: [birth year]  Transition of [facility]) CM/SW Contact:    Shade Flood, LCSW Phone Number: 04/08/2019, 2:07 PM  Clinical Narrative:                  Pt from home with husband. Spoke with pt to assess. Per pt, she has all the DME she needs at home. She has assistance from her husband and son as needed. Pt has had HH from [facility] in the past but she is not currently active with them at this time. Per pt, she can drive but if she isn't feeling good, her family will drive her. Pt states she does not have any difficulty getting to appointments or obtaining medications.  Anticipate dc with HH and a referral to [facility] CM due to high readmission risk.  TOC will follow.  Expected Discharge Plan: [place] Barriers to Discharge: Continued Medical Work up   Patient Goals and CMS Choice        Expected Discharge Plan and Services Expected Discharge Plan: [place] In-house Referral: Clinical Social Work     Living arrangements for the past 2 months: Single Family Home Expected Discharge Date: 04/09/19                                    Prior Living Arrangements/Services Living arrangements for the past 2 months: Single Family Home Lives with:: Spouse Patient language and need for interpreter reviewed:: Yes Do you feel safe going back to the place where you live?: Yes      Need for Family Participation in Patient Care: Yes (Comment) Care giver support system in place?: Yes (comment) Current home services: DME Criminal Activity/Legal Involvement Pertinent to Current Situation/Hospitalization: No - Comment as needed  Activities of Daily Living Home Assistive Devices/Equipment: None ADL Screening (condition at time of admission) Patient's cognitive ability adequate to safely complete daily activities?: Yes Is the patient  deaf or have difficulty hearing?: No Does the patient have difficulty seeing, even when wearing glasses/contacts?: No Does the patient have difficulty concentrating, remembering, or making decisions?: No Patient able to express need for assistance with ADLs?: Yes Does the patient have difficulty dressing or bathing?: No Independently performs ADLs?: Yes (appropriate for developmental age) Does the patient have difficulty walking or climbing stairs?: Yes Weakness of Legs: Both Weakness of Arms/Hands: None  Permission Sought/Granted                  Emotional Assessment Appearance:: Appears stated age Attitude/Demeanor/Rapport: Engaged Affect (typically observed): Pleasant, Calm Orientation: : Oriented to Self, Oriented to Place, Oriented to  Time, Oriented to Situation Alcohol / Substance Use: Not Applicable Psych Involvement: No (comment)  Admission diagnosis:  COPD exacerbation ([place]) [J44.1] AKI (acute kidney injury) ([place]) [N17.9] Patient Active Problem List   Diagnosis Date Noted  . Acute CHF (congestive heart failure) ([place]) 04/08/2019  . Acute CHF ([place]) 04/07/2019  . Asthma, chronic, unspecified asthma severity, with acute exacerbation 04/07/2019  . Benign essential HTN   . CHF (congestive heart failure) ([place]) 07/26/2018  . Transaminitis 07/23/2018  . Acute on chronic respiratory failure with hypoxia and hypercapnia ([place]) 06/19/2018  . Acute respiratory failure with hypoxia ([place])   . Supratherapeutic INR 05/03/2018  . CHF exacerbation ([place]) 05/02/2018  .  Acute metabolic encephalopathy 55/10/5866  . CKD (chronic kidney disease) stage 3, GFR 30-59 ml/min (HCC) 05/02/2018  . Hypothyroidism 05/02/2018  . Generalized weakness 05/02/2018  . Physical deconditioning   . Coronary artery disease of bypass graft of native heart with stable angina pectoris ([place])   . Renal failure (ARF), acute on chronic (HCC)   . Acute on chronic respiratory failure with hypercapnia ([place])  03/24/2018  . Hypomagnesemia 03/24/2018  . Hypokalemia 03/24/2018  . Hypophosphatemia 03/24/2018  . Atrial fibrillation with RVR ([place]) 03/24/2018  . Elevated troponin 03/24/2018  . Coughing 01/14/2018  . Anemia 01/14/2018  . Hx of adenomatous colonic polyps 01/14/2018  . Acute on chronic diastolic congestive heart failure ([place]) 12/17/2016  . COPD with acute exacerbation ([place]) 12/17/2016  . Bilateral leg edema 07/05/2014  . Encounter for therapeutic drug monitoring 11/15/2013  . Chronic venous insufficiency 08/26/2011  . Chronic anticoagulation 01/11/2011  . Peptic stricture of esophagus 10/04/2010  . GERD 09/16/2010  . DYSPHAGIA 09/16/2010  . H/O heart valve replacement with mechanical valve 06/05/2009  . Hyperlipidemia 06/04/2009  . Coronary atherosclerosis of native coronary artery 06/04/2009  . Atrial fibrillation, chronic 06/04/2009   PCP:  Octavio Graves, DO Pharmacy:   [facility] [address], [place] [address] [place] [address] Phone: [phone] Fax: [phone]  [facility] [place], [place] [address] [place] [address] Phone: [phone] Fax: [phone]     Social Determinants of Health (SDOH) Interventions    Readmission Risk Interventions No flowsheet data found.

## 2019-04-08 NOTE — Progress Notes (Signed)
Patient seems SOB. Breathing treatment started and patient's phone rang I explained to patient she needed to breathe it in and slow down on the talking so she could breathe medicine in and she refused.

## 2019-04-08 NOTE — Progress Notes (Signed)
ANTICOAGULATION CONSULT NOTE - Initial Consult  Pharmacy Consult for warfarin Indication: AVR (St. Jude valve) and atrial fibrillation   Patient Measurements: Height: 5\' 9"  (175.3 cm) Weight: 190 lb 7.6 oz (86.4 kg) IBW/kg (Calculated) : 66.2   Vital Signs: Temp: 97.7 F (36.5 C) (06/19 0509) Temp Source: Oral (06/19 0509) BP: 105/76 (06/19 0509) Pulse Rate: 74 (06/19 0509)  Labs: Recent Labs    04/07/19 1819 04/07/19 2305 04/08/19 0522 04/08/19 0851  HGB 8.6*  --  8.6*  --   HCT 28.9*  --  28.9*  --   PLT 220  --  195  --   LABPROT 33.6*  --   --  31.5*  INR 3.4*  --   --  3.1*  CREATININE 2.19*  --  2.05*  --   TROPONINI <0.03 <0.03 <0.03  --     Estimated Creatinine Clearance: 31.2 mL/min (A) (by C-G formula based on SCr of 2.05 mg/dL (H)).  Assessment: Pharmacy consulted to dose warfarin in patient with aortic St Jude mechanical valve replacement and history of atrial fibrillation.    Home dose of warfarin listed as 4 mg on Tuesday and 3 mg ROW although per current med rec, patient unsure of regimen.   6/19 AM update: INR 3.1   May be increased due to CHF exacerbation.  Goal of Therapy:  INR 2-3 Monitor platelets by anticoagulation protocol: Yes   Plan:  Hold warfarin again today. Monitor daily INR and s/s of bledding.  [name] 04/08/2019,9:14 AM

## 2019-04-08 NOTE — Progress Notes (Signed)
*  PRELIMINARY RESULTS* Echocardiogram 2D Echocardiogram has been performed.  Leavy Cella 04/08/2019, 1:58 PM

## 2019-04-09 LAB — CBC
HCT: 26 % — ABNORMAL LOW (ref 36.0–46.0)
Hemoglobin: 7.6 g/dL — ABNORMAL LOW (ref 12.0–15.0)
MCH: 26.4 pg (ref 26.0–34.0)
MCHC: 29.2 g/dL — ABNORMAL LOW (ref 30.0–36.0)
MCV: 90.3 fL (ref 80.0–100.0)
Platelets: 181 10*3/uL (ref 150–400)
RBC: 2.88 MIL/uL — ABNORMAL LOW (ref 3.87–5.11)
RDW: 16 % — ABNORMAL HIGH (ref 11.5–15.5)
WBC: 7.6 10*3/uL (ref 4.0–10.5)
nRBC: 0 % (ref 0.0–0.2)

## 2019-04-09 LAB — BASIC METABOLIC PANEL
Anion gap: 10 (ref 5–15)
BUN: 56 mg/dL — ABNORMAL HIGH (ref 8–23)
CO2: 27 mmol/L (ref 22–32)
Calcium: 8.4 mg/dL — ABNORMAL LOW (ref 8.9–10.3)
Chloride: 97 mmol/L — ABNORMAL LOW (ref 98–111)
Creatinine, Ser: 1.81 mg/dL — ABNORMAL HIGH (ref 0.44–1.00)
GFR calc Af Amer: 33 mL/min — ABNORMAL LOW (ref >60)
GFR calc non Af Amer: 28 mL/min — ABNORMAL LOW (ref >60)
Glucose, Bld: 146 mg/dL — ABNORMAL HIGH (ref 70–99)
Potassium: 4.1 mmol/L (ref 3.5–5.1)
Sodium: 134 mmol/L — ABNORMAL LOW (ref 135–145)

## 2019-04-09 LAB — GLUCOSE, CAPILLARY
Glucose-Capillary: 126 mg/dL — ABNORMAL HIGH (ref 70–99)
Glucose-Capillary: 135 mg/dL — ABNORMAL HIGH (ref 70–99)
Glucose-Capillary: 139 mg/dL — ABNORMAL HIGH (ref 70–99)

## 2019-04-09 LAB — MAGNESIUM: Magnesium: 2.2 mg/dL (ref 1.7–2.4)

## 2019-04-09 LAB — PROTIME-INR
INR: 4 — ABNORMAL HIGH (ref 0.8–1.2)
Prothrombin Time: 38.1 seconds — ABNORMAL HIGH (ref 11.4–15.2)

## 2019-04-09 MED ORDER — DM-GUAIFENESIN ER 30-600 MG PO TB12
1.0000 | ORAL_TABLET | Freq: Two times a day (BID) | ORAL | Status: DC
  Administered 2019-04-09 – 2019-04-11 (×5): 1 via ORAL
  Filled 2019-04-09 (×5): qty 1

## 2019-04-09 MED ORDER — METHYLPREDNISOLONE SODIUM SUCC 125 MG IJ SOLR
60.0000 mg | Freq: Two times a day (BID) | INTRAMUSCULAR | Status: DC
  Administered 2019-04-09: 60 mg via INTRAVENOUS
  Filled 2019-04-09 (×2): qty 2

## 2019-04-09 NOTE — Progress Notes (Signed)
ANTICOAGULATION CONSULT NOTE - Initial Consult  Pharmacy Consult for warfarin Indication: AVR  and atrial fibrillation   Patient Measurements: Height: 5\' 9"  (175.3 cm) Weight: 190 lb 7.6 oz (86.4 kg) IBW/kg (Calculated) : 66.2   Vital Signs: Temp: 98 F (36.7 C) (06/20 0800) Temp Source: Oral (06/20 0800) BP: 114/65 (06/20 0800) Pulse Rate: 72 (06/20 0800)  Labs: Recent Labs    04/07/19 1819 04/07/19 2305 04/08/19 0522 04/08/19 0851 04/08/19 1159 04/09/19 0603  HGB 8.6*  --  8.6*  --   --  7.6*  HCT 28.9*  --  28.9*  --   --  26.0*  PLT 220  --  195  --   --  181  LABPROT 33.6*  --   --  31.5*  --  38.1*  INR 3.4*  --   --  3.1*  --  4.0*  CREATININE 2.19*  --  2.05*  --   --  1.81*  TROPONINI <0.03 <0.03 <0.03  --  <0.03  --     Estimated Creatinine Clearance: 35.4 mL/min (A) (by C-G formula based on SCr of 1.81 mg/dL (H)).  Assessment: Pharmacy consulted to dose warfarin in patient with aortic St Jude mechanical valve replacement and history of atrial fibrillation.    Home dose of warfarin listed as 4 mg on Tuesday and 3 mg ROW although per current med rec, patient unsure of regimen.  She has a history of chronic, stable anemia.  6/20 AM update: INR 4.0   May be increased due to CHF exacerbation.  Goal of Therapy:  INR 2-3 Monitor platelets by anticoagulation protocol: Yes   Plan:  Hold warfarin today. Monitor daily INR and s/s of bledding.  [name] 04/09/2019,8:51 AM

## 2019-04-09 NOTE — Progress Notes (Signed)
PROGRESS NOTE    [name]  MGQ:[id number] DOB: 1951/01/29 DOA: 04/07/2019 PCP: Octavio Graves, DO   Brief Narrative:  Per HPI: [name] a68 y.o.female,h/o Aortic stenosis s/p AVR 2007([employer] Jude), CAD s/p CABG, Pafib, CKD stage3, Asthma, presents with c/o dyspneax 2 days, slight cough, mostly dry.+ wheezing. Pt denies fever, chills, cp, palp, n/v, abd pain, diarrhea, brbpr, black stool, orthopnea, pnd, lower ext edema. Pt uses o2 2L [place] at home baseline. Pt denies medication, noncompliance  Patient was admitted with acute on chronic hypoxemic respiratory failure secondary to asthma exacerbation as well as mild acute CHF.  She appears to be improving on IV steroids, breathing treatments, and Lasix.   Assessment & Plan:   Principal Problem:   Acute respiratory failure with hypoxia (HCC) Active Problems:   Coronary atherosclerosis of native coronary artery   Atrial fibrillation, chronic   Acute on chronic diastolic congestive heart failure (HCC)   Anemia   Renal failure (ARF), acute on chronic (HCC)   Hypothyroidism   Acute CHF (HCC)   Asthma, chronic, unspecified asthma severity, with acute exacerbation   Acute CHF (congestive heart failure) (HCC)   Acute respiratory failure with hypoxia Secondary to Asthma Exacerbation, mild acute on chronic CHF (diastolic) Solumedrol to 60 mg IV twice daily today Albuterol neb q6h and q6h prn  incruse 1puff qday Cont Symbicort 2puff bid=> Dulera 2puff bid Continue Lasix 80mg  iv bid  Mild AcuteCHF (EF 60-65%)/CAD/Afib/ H/o AVR Tele Trop I q6h x3 negative Cardiac echo with noted LVEF 55-60% and no other acute abnormalities Cont Bisoprolol 10mg  po qday Cont Cardizem CD 120mg  po qday Oral lasix=> Lasix 80mg  iv bid Cont Magnesium oxide 400mg  po qday Coumadin pharmacy to dose  Mild ARF-improved Likely secondary to dehydration Hold Metolazone Use Lasix iv Check cmp in am  Anemia-stable Consider ferritin,  iron, tibc, b12, folate, spep, upep Check cbc in am  Hypothyroidism Cont Levothyroxine 50 micrograms po qday  Gerd Cont PPI  Gout Cont Alluprinol 100mg  po qday  Anxiety Cont Xanax 0.5mg  po bid prn  Chronic back pain Cont Flexeril 10mg  po qhs prn  Gerd Cont PPI   DVT prophylaxis: Coumadin, currently being held due to elevated INR Code Status: Full Family Communication: None at bedside Disposition Plan: Continue steroids as well as diuretics and breathing treatments.  Wean steroids today.  Patient continues to remain inpatient due to need for IV management for diuresis as well as steroids.  She continues to have respiratory issues that require close monitoring.   Consultants:   None  Procedures:   None  Antimicrobials:   None  Subjective: Patient seen and evaluated today with no new acute complaints or concerns. No acute concerns or events noted overnight.  She appears to be feeling better this morning, but still has lower extremity swelling.  She is back on her usual 2 L nasal cannula.  Objective: Vitals:   04/09/19 0230 04/09/19 0555 04/09/19 0800 04/09/19 0910  BP:  111/62 114/65   Pulse:  75 72   Resp:  18 18   Temp:  97.9 F (36.6 C) 98 F (36.7 C)   TempSrc:  Oral Oral   SpO2: 96% 96% 100% 95%  Weight:      Height:        Intake/Output Summary (Last 24 hours) at 04/09/2019 1113 Last data filed at 04/09/2019 0947 Gross per 24 hour  Intake 240 ml  Output 600 ml  Net -360 ml   Filed Weights   04/07/19  1803 04/07/19 2333 04/08/19 0500  Weight: 90.4 kg 86.5 kg 86.4 kg    Examination:  General exam: Appears calm and comfortable  Respiratory system: Clear to auscultation. Respiratory effort normal.  Currently on 2 L nasal cannula with minimal wheezing bilaterally. Cardiovascular system: S1 & S2 heard, RRR. No JVD, murmurs, rubs, gallops or clicks.  1-2+ pitting edema noted bilaterally. Gastrointestinal system: Abdomen is  nondistended, soft and nontender. No organomegaly or masses felt. Normal bowel sounds heard. Central nervous system: Alert and oriented. No focal neurological deficits. Extremities: Symmetric 5 x 5 power. Skin: No rashes, lesions or ulcers Psychiatry: Judgement and insight appear normal. Mood & affect appropriate.     Data Reviewed: I have personally reviewed following labs and imaging studies  CBC: Recent Labs  Lab 04/07/19 1819 04/08/19 0522 04/09/19 0603  WBC 9.0 6.5 7.6  NEUTROABS 6.6  --   --   HGB 8.6* 8.6* 7.6*  HCT 28.9* 28.9* 26.0*  MCV 90.6 90.9 90.3  PLT 220 195 338   Basic Metabolic Panel: Recent Labs  Lab 04/07/19 1819 04/08/19 0522 04/09/19 0603  NA 139 138 134*  K 5.1 4.4 4.1  CL 102 99 97*  CO2 27 26 27   GLUCOSE 96 107* 146*  BUN 48* 49* 56*  CREATININE 2.19* 2.05* 1.81*  CALCIUM 8.9 8.8* 8.4*  MG  --   --  2.2   GFR: Estimated Creatinine Clearance: 35.4 mL/min (A) (by C-G formula based on SCr of 1.81 mg/dL (H)). Liver Function Tests: Recent Labs  Lab 04/08/19 0522  AST 31  ALT 19  ALKPHOS 94  BILITOT 1.0  PROT 7.5  ALBUMIN 3.6   No results for input(s): LIPASE, AMYLASE in the last 168 hours. No results for input(s): AMMONIA in the last 168 hours. Coagulation Profile: Recent Labs  Lab 04/07/19 1819 04/08/19 0851 04/09/19 0603  INR 3.4* 3.1* 4.0*   Cardiac Enzymes: Recent Labs  Lab 04/07/19 1819 04/07/19 2305 04/08/19 0522 04/08/19 1159  TROPONINI <0.03 <0.03 <0.03 <0.03   BNP (last 3 results) No results for input(s): PROBNP in the last 8760 hours. HbA1C: No results for input(s): HGBA1C in the last 72 hours. CBG: Recent Labs  Lab 04/08/19 1105 04/08/19 1710 04/08/19 2115 04/09/19 0732 04/09/19 1112  GLUCAP 250* 104* 209* 126* 135*   Lipid Profile: No results for input(s): CHOL, HDL, LDLCALC, TRIG, CHOLHDL, LDLDIRECT in the last 72 hours. Thyroid Function Tests: Recent Labs    04/07/19 2305  TSH 5.759*   Anemia  Panel: No results for input(s): VITAMINB12, FOLATE, FERRITIN, TIBC, IRON, RETICCTPCT in the last 72 hours. Sepsis Labs: No results for input(s): PROCALCITON, LATICACIDVEN in the last 168 hours.  Recent Results (from the past 240 hour(s))  SARS Coronavirus 2 (CEPHEID - Performed in [place] hospital lab), Hosp Order     Status: None   Collection Time: 04/07/19  9:36 PM   Specimen: Nasopharyngeal Swab  Result Value Ref Range Status   SARS Coronavirus 2 NEGATIVE NEGATIVE Final    Comment: (NOTE) If result is NEGATIVE SARS-CoV-2 target nucleic acids are NOT DETECTED. The SARS-CoV-2 RNA is generally detectable in upper and lower  respiratory specimens during the acute phase of infection. The lowest  concentration of SARS-CoV-2 viral copies this assay can detect is 250  copies / mL. A negative result does not preclude SARS-CoV-2 infection  and should not be used as the sole basis for treatment or other  patient management decisions.  A negative result may  occur with  improper specimen collection / handling, submission of specimen other  than nasopharyngeal swab, presence of viral mutation(s) within the  areas targeted by this assay, and inadequate number of viral copies  (<250 copies / mL). A negative result must be combined with clinical  observations, patient history, and epidemiological information. If result is POSITIVE SARS-CoV-2 target nucleic acids are DETECTED. The SARS-CoV-2 RNA is generally detectable in upper and lower  respiratory specimens dur ing the acute phase of infection.  Positive  results are indicative of active infection with SARS-CoV-2.  Clinical  correlation with patient history and other diagnostic information is  necessary to determine patient infection status.  Positive results do  not rule out bacterial infection or co-infection with other viruses. If result is PRESUMPTIVE POSTIVE SARS-CoV-2 nucleic acids MAY BE PRESENT.   A presumptive positive result was  obtained on the submitted specimen  and confirmed on repeat testing.  While 2019 novel coronavirus  (SARS-CoV-2) nucleic acids may be present in the submitted sample  additional confirmatory testing may be necessary for epidemiological  and / or clinical management purposes  to differentiate between  SARS-CoV-2 and other Sarbecovirus currently known to infect humans.  If clinically indicated additional testing with an alternate test  methodology [phone]) is advised. The SARS-CoV-2 RNA is generally  detectable in upper and lower respiratory sp ecimens during the acute  phase of infection. The expected result is Negative. Fact Sheet for Patients:  StrictlyIdeas.no Fact Sheet for Healthcare Providers: BankingDealers.co.za This test is not yet approved or cleared by the [place] FDA and has been authorized for detection and/or diagnosis of SARS-CoV-2 by FDA under an Emergency Use Authorization (EUA).  This EUA will remain in effect (meaning this test can be used) for the duration of the COVID-19 declaration under Section 564(b)(1) of the Act, 21 U.S.C. section 360bbb-3(b)(1), unless the authorization is terminated or revoked sooner. Performed at [facility], [address] [place], [place] [address]          Radiology Studies: Dg Chest [facility] 1 View  Result Date: 04/07/2019 CLINICAL DATA:  Shortness of breath, cough EXAM: PORTABLE CHEST 1 VIEW COMPARISON:  12/28/2018 FINDINGS: Cardiomegaly. Prior median sternotomy and valve replacement. Mild pulmonary vascular congestion and interstitial prominence could reflect interstitial edema. No effusions or acute bony abnormality. IMPRESSION: Cardiomegaly with vascular congestion and interstitial prominence, question interstitial edema. Electronically Signed   By: Rolm Baptise M.D.   On: 04/07/2019 19:26        Scheduled Meds: . albuterol  2.5 mg Nebulization Q6H  . allopurinol  100 mg  Oral Daily  . bisoprolol  10 mg Oral Daily  . dextromethorphan-guaiFENesin  1 tablet Oral BID  . diltiazem  120 mg Oral Daily  . fluticasone  2 spray Each Nare Daily  . furosemide  80 mg Intravenous BID  . levothyroxine  50 mcg Oral QAC breakfast  . loratadine  10 mg Oral Daily  . magnesium oxide  400 mg Oral Daily  . methylPREDNISolone (SOLU-MEDROL) injection  60 mg Intravenous Q12H  . mometasone-formoterol  2 puff Inhalation BID  . pantoprazole  40 mg Oral Daily  . potassium chloride SA  60 mEq Oral Daily  . sodium chloride flush  3 mL Intravenous Q12H  . umeclidinium bromide  1 puff Inhalation Daily   Continuous Infusions: . sodium chloride       LOS: 1 day    Time spent: 30 minutes    Maleni Seyer Darleen Crocker, DO  [facility] Pager [phone]  If 7PM-7AM, please contact night-coverage [email or web address] Password [email or web address] 04/09/2019, 11:13 AM

## 2019-04-10 LAB — CBC
HCT: 26.5 % — ABNORMAL LOW (ref 36.0–46.0)
Hemoglobin: 8 g/dL — ABNORMAL LOW (ref 12.0–15.0)
MCH: 26.9 pg (ref 26.0–34.0)
MCHC: 30.2 g/dL (ref 30.0–36.0)
MCV: 89.2 fL (ref 80.0–100.0)
Platelets: 208 10*3/uL (ref 150–400)
RBC: 2.97 MIL/uL — ABNORMAL LOW (ref 3.87–5.11)
RDW: 16.5 % — ABNORMAL HIGH (ref 11.5–15.5)
WBC: 9.9 10*3/uL (ref 4.0–10.5)
nRBC: 0 % (ref 0.0–0.2)

## 2019-04-10 LAB — BASIC METABOLIC PANEL
Anion gap: 10 (ref 5–15)
BUN: 58 mg/dL — ABNORMAL HIGH (ref 8–23)
CO2: 26 mmol/L (ref 22–32)
Calcium: 8.2 mg/dL — ABNORMAL LOW (ref 8.9–10.3)
Chloride: 95 mmol/L — ABNORMAL LOW (ref 98–111)
Creatinine, Ser: 1.79 mg/dL — ABNORMAL HIGH (ref 0.44–1.00)
GFR calc Af Amer: 33 mL/min — ABNORMAL LOW (ref >60)
GFR calc non Af Amer: 29 mL/min — ABNORMAL LOW (ref >60)
Glucose, Bld: 126 mg/dL — ABNORMAL HIGH (ref 70–99)
Potassium: 4.1 mmol/L (ref 3.5–5.1)
Sodium: 131 mmol/L — ABNORMAL LOW (ref 135–145)

## 2019-04-10 LAB — MAGNESIUM: Magnesium: 2.2 mg/dL (ref 1.7–2.4)

## 2019-04-10 LAB — PROTIME-INR
INR: 3.9 — ABNORMAL HIGH (ref 0.8–1.2)
Prothrombin Time: 37.9 seconds — ABNORMAL HIGH (ref 11.4–15.2)

## 2019-04-10 LAB — GLUCOSE, CAPILLARY: Glucose-Capillary: 112 mg/dL — ABNORMAL HIGH (ref 70–99)

## 2019-04-10 MED ORDER — METHYLPREDNISOLONE SODIUM SUCC 40 MG IJ SOLR
40.0000 mg | Freq: Two times a day (BID) | INTRAMUSCULAR | Status: DC
  Administered 2019-04-10 – 2019-04-11 (×3): 40 mg via INTRAVENOUS
  Filled 2019-04-10 (×3): qty 1

## 2019-04-10 MED ORDER — FUROSEMIDE 10 MG/ML IJ SOLN
80.0000 mg | Freq: Three times a day (TID) | INTRAMUSCULAR | Status: DC
  Administered 2019-04-10 – 2019-04-11 (×3): 80 mg via INTRAVENOUS
  Filled 2019-04-10 (×3): qty 8

## 2019-04-10 MED ORDER — METOLAZONE 5 MG PO TABS
2.5000 mg | ORAL_TABLET | Freq: Every day | ORAL | Status: DC
  Administered 2019-04-10 – 2019-04-11 (×2): 2.5 mg via ORAL
  Filled 2019-04-10 (×2): qty 1

## 2019-04-10 NOTE — Progress Notes (Signed)
ANTICOAGULATION CONSULT NOTE - Initial Consult  Pharmacy Consult for warfarin Indication: AVR  and atrial fibrillation   Patient Measurements: Height: 5\' 9"  (175.3 cm) Weight: 194 lb 10.7 oz (88.3 kg) IBW/kg (Calculated) : 66.2   Vital Signs: Temp: 98 F (36.7 C) (06/21 0611) Temp Source: Oral (06/21 0611) BP: 111/68 (06/21 0611) Pulse Rate: 70 (06/21 0611)  Labs: Recent Labs    04/07/19 2305 04/08/19 0522 04/08/19 0851 04/08/19 1159 04/09/19 0603 04/10/19 0600  HGB  --  8.6*  --   --  7.6* 8.0*  HCT  --  28.9*  --   --  26.0* 26.5*  PLT  --  195  --   --  181 208  LABPROT  --   --  31.5*  --  38.1* 37.9*  INR  --   --  3.1*  --  4.0* 3.9*  CREATININE  --  2.05*  --   --  1.81* 1.79*  TROPONINI <0.03 <0.03  --  <0.03  --   --     Estimated Creatinine Clearance: 36.1 mL/min (A) (by C-G formula based on SCr of 1.79 mg/dL (H)).  Assessment: Pharmacy consulted to dose warfarin in patient with aortic [employer] Jude mechanical valve replacement and history of atrial fibrillation.    Home dose of warfarin listed as 4 mg on Tuesday and 3 mg ROW although per current med rec, patient unsure of regimen.  She has a history of chronic, stable anemia.  6/20 AM update: INR 3.9  May be increased due to CHF exacerbation.  Goal of Therapy:  INR 2-3 Monitor platelets by anticoagulation protocol: Yes   Plan:  Hold warfarin again  today. Monitor daily INR and s/s of bleeding.   [name], Pharm. D. Clinical Pharmacist 04/10/2019 9:03 AM

## 2019-04-10 NOTE — Progress Notes (Signed)
PROGRESS NOTE    [name]  YBO:[id number] DOB: 31-Jul-1951 DOA: 04/07/2019 PCP: Octavio Graves, DO   Brief Narrative:  Per HPI: [name] a68 y.o.female,h/o Aortic stenosis s/p AVR 2007([employer] Jude), CAD s/p CABG, Pafib, CKD stage3, Asthma, presents with c/o dyspneax 2 days, slight cough, mostly dry.+ wheezing. Pt denies fever, chills, cp, palp, n/v, abd pain, diarrhea, brbpr, black stool, orthopnea, pnd, lower ext edema. Pt uses o2 2L [place] at home baseline. Pt denies medication, noncompliance  Patient was admitted with acute on chronic hypoxemic respiratory failure secondary to asthma exacerbation as well as mild acute CHF. She appears to be improving on IV steroids, breathing treatments, and Lasix.  She continues to have some lower extremity edema on 6/21 for which Lasix dosing will be increased slightly further.   Assessment & Plan:   Principal Problem:   Acute respiratory failure with hypoxia (HCC) Active Problems:   Coronary atherosclerosis of native coronary artery   Atrial fibrillation, chronic   Acute on chronic diastolic congestive heart failure (HCC)   Anemia   Renal failure (ARF), acute on chronic (HCC)   Hypothyroidism   Acute CHF (HCC)   Asthma, chronic, unspecified asthma severity, with acute exacerbation   Acute CHF (congestive heart failure) (HCC)   Acute respiratory failure with hypoxia Secondary to Asthma Exacerbation, mild acute on chronic CHF (diastolic) Solumedrol to 40 mg IV twice daily today Albuterol neb q6h as needed only incruse 1puff qday Cont Symbicort 2puff bid=> Dulera 2puff bid Continue Lasix 80mg  iv 3 times daily  Mild AcuteCHF (EF 60-65%)/CAD/Afib/ H/o AVR Tele Trop I q6h x3 negative Cardiac echo with noted LVEF 55-60% and no other acute abnormalities Cont Bisoprolol 10mg  po qday Cont Cardizem CD 120mg  po qday Oral lasix=> Lasix 80mg  iv 3 times daily today Cont Magnesium oxide 400mg  po qday Coumadin pharmacy to dose   Mild ARF- back to baseline Likely secondary to dehydration Lasix IV to 3 times daily dosing and will restart metolazone Check BMP in a.m.  Anemia-stable  Hypothyroidism Cont Levothyroxine 50 micrograms po qday  Gerd Cont PPI  Gout Cont Alluprinol 100mg  po qday  Anxiety Cont Xanax 0.5mg  po bid prn  Chronic back pain Cont Flexeril 10mg  po qhs prn  Gerd Cont PPI   DVT prophylaxis:Coumadin, currently being held due to elevated INR Code Status:Full Family Communication:None at bedside Disposition Plan:Steroids tapered further today.  IV Lasix increased for more aggressive diuresis.  Anticipate discharge in the next 24 hours if stable and improved.  Patient continues to remain inpatient due to need for IV management for diuresis as well as steroids.  She continues to have respiratory issues that require close monitoring.   Consultants:  None  Procedures:  None  Antimicrobials:   None   Subjective: Patient seen and evaluated today with no new acute complaints or concerns. No acute concerns or events noted overnight.  She seems to think that she is feeling better each day.  Objective: Vitals:   04/09/19 1944 04/09/19 2116 04/09/19 2117 04/10/19 0611  BP:  (!) 109/58 (!) 109/58 111/68  Pulse:  84 78 70  Resp:  18 18 18   Temp:   97.6 F (36.4 C) 98 F (36.7 C)  TempSrc:   Oral Oral  SpO2: 96% 99% 100% 98%  Weight:    88.3 kg  Height:        Intake/Output Summary (Last 24 hours) at 04/10/2019 1055 Last data filed at 04/10/2019 0320 Gross per 24 hour  Intake 480 ml  Output 1350 ml  Net -870 ml   Filed Weights   04/07/19 2333 04/08/19 0500 04/10/19 0611  Weight: 86.5 kg 86.4 kg 88.3 kg    Examination:  General exam: Appears calm and comfortable  Respiratory system: Clear to auscultation. Respiratory effort normal.  On 2 L nasal cannula. Cardiovascular system: S1 & S2 heard, RRR. No JVD, murmurs, rubs, gallops or clicks.   Continues with scant pedal edema. Gastrointestinal system: Abdomen is nondistended, soft and nontender. No organomegaly or masses felt. Normal bowel sounds heard. Central nervous system: Alert and oriented. No focal neurological deficits. Extremities: Symmetric 5 x 5 power. Skin: No rashes, lesions or ulcers Psychiatry: Judgement and insight appear normal. Mood & affect appropriate.     Data Reviewed: I have personally reviewed following labs and imaging studies  CBC: Recent Labs  Lab 04/07/19 1819 04/08/19 0522 04/09/19 0603 04/10/19 0600  WBC 9.0 6.5 7.6 9.9  NEUTROABS 6.6  --   --   --   HGB 8.6* 8.6* 7.6* 8.0*  HCT 28.9* 28.9* 26.0* 26.5*  MCV 90.6 90.9 90.3 89.2  PLT 220 195 181 170   Basic Metabolic Panel: Recent Labs  Lab 04/07/19 1819 04/08/19 0522 04/09/19 0603 04/10/19 0600  NA 139 138 134* 131*  K 5.1 4.4 4.1 4.1  CL 102 99 97* 95*  CO2 27 26 27 26   GLUCOSE 96 107* 146* 126*  BUN 48* 49* 56* 58*  CREATININE 2.19* 2.05* 1.81* 1.79*  CALCIUM 8.9 8.8* 8.4* 8.2*  MG  --   --  2.2 2.2   GFR: Estimated Creatinine Clearance: 36.1 mL/min (A) (by C-G formula based on SCr of 1.79 mg/dL (H)). Liver Function Tests: Recent Labs  Lab 04/08/19 0522  AST 31  ALT 19  ALKPHOS 94  BILITOT 1.0  PROT 7.5  ALBUMIN 3.6   No results for input(s): LIPASE, AMYLASE in the last 168 hours. No results for input(s): AMMONIA in the last 168 hours. Coagulation Profile: Recent Labs  Lab 04/07/19 1819 04/08/19 0851 04/09/19 0603 04/10/19 0600  INR 3.4* 3.1* 4.0* 3.9*   Cardiac Enzymes: Recent Labs  Lab 04/07/19 1819 04/07/19 2305 04/08/19 0522 04/08/19 1159  TROPONINI <0.03 <0.03 <0.03 <0.03   BNP (last 3 results) No results for input(s): PROBNP in the last 8760 hours. HbA1C: No results for input(s): HGBA1C in the last 72 hours. CBG: Recent Labs  Lab 04/08/19 2115 04/09/19 0732 04/09/19 1112 04/09/19 1602 04/10/19 0719  GLUCAP 209* 126* 135* 139* 112*    Lipid Profile: No results for input(s): CHOL, HDL, LDLCALC, TRIG, CHOLHDL, LDLDIRECT in the last 72 hours. Thyroid Function Tests: Recent Labs    04/07/19 2305  TSH 5.759*   Anemia Panel: No results for input(s): VITAMINB12, FOLATE, FERRITIN, TIBC, IRON, RETICCTPCT in the last 72 hours. Sepsis Labs: No results for input(s): PROCALCITON, LATICACIDVEN in the last 168 hours.  Recent Results (from the past 240 hour(s))  SARS Coronavirus 2 (CEPHEID - Performed in [place] hospital lab), Hosp Order     Status: None   Collection Time: 04/07/19  9:36 PM   Specimen: Nasopharyngeal Swab  Result Value Ref Range Status   SARS Coronavirus 2 NEGATIVE NEGATIVE Final    Comment: (NOTE) If result is NEGATIVE SARS-CoV-2 target nucleic acids are NOT DETECTED. The SARS-CoV-2 RNA is generally detectable in upper and lower  respiratory specimens during the acute phase of infection. The lowest  concentration of SARS-CoV-2 viral copies this assay can detect is 250  copies /  mL. A negative result does not preclude SARS-CoV-2 infection  and should not be used as the sole basis for treatment or other  patient management decisions.  A negative result may occur with  improper specimen collection / handling, submission of specimen other  than nasopharyngeal swab, presence of viral mutation(s) within the  areas targeted by this assay, and inadequate number of viral copies  (<250 copies / mL). A negative result must be combined with clinical  observations, patient history, and epidemiological information. If result is POSITIVE SARS-CoV-2 target nucleic acids are DETECTED. The SARS-CoV-2 RNA is generally detectable in upper and lower  respiratory specimens dur ing the acute phase of infection.  Positive  results are indicative of active infection with SARS-CoV-2.  Clinical  correlation with patient history and other diagnostic information is  necessary to determine patient infection status.  Positive  results do  not rule out bacterial infection or co-infection with other viruses. If result is PRESUMPTIVE POSTIVE SARS-CoV-2 nucleic acids MAY BE PRESENT.   A presumptive positive result was obtained on the submitted specimen  and confirmed on repeat testing.  While 2019 novel coronavirus  (SARS-CoV-2) nucleic acids may be present in the submitted sample  additional confirmatory testing may be necessary for epidemiological  and / or clinical management purposes  to differentiate between  SARS-CoV-2 and other Sarbecovirus currently known to infect humans.  If clinically indicated additional testing with an alternate test  methodology [phone]) is advised. The SARS-CoV-2 RNA is generally  detectable in upper and lower respiratory sp ecimens during the acute  phase of infection. The expected result is Negative. Fact Sheet for Patients:  StrictlyIdeas.no Fact Sheet for Healthcare Providers: BankingDealers.co.za This test is not yet approved or cleared by the [place] FDA and has been authorized for detection and/or diagnosis of SARS-CoV-2 by FDA under an Emergency Use Authorization (EUA).  This EUA will remain in effect (meaning this test can be used) for the duration of the COVID-19 declaration under Section 564(b)(1) of the Act, 21 U.S.C. section 360bbb-3(b)(1), unless the authorization is terminated or revoked sooner. Performed at [facility], [address], [place], [place] [address]          Radiology Studies: No results found.      Scheduled Meds: . allopurinol  100 mg Oral Daily  . bisoprolol  10 mg Oral Daily  . dextromethorphan-guaiFENesin  1 tablet Oral BID  . diltiazem  120 mg Oral Daily  . fluticasone  2 spray Each Nare Daily  . furosemide  80 mg Intravenous TID  . levothyroxine  50 mcg Oral QAC breakfast  . loratadine  10 mg Oral Daily  . magnesium oxide  400 mg Oral Daily  . methylPREDNISolone  (SOLU-MEDROL) injection  40 mg Intravenous Q12H  . mometasone-formoterol  2 puff Inhalation BID  . pantoprazole  40 mg Oral Daily  . potassium chloride SA  60 mEq Oral Daily  . sodium chloride flush  3 mL Intravenous Q12H  . umeclidinium bromide  1 puff Inhalation Daily   Continuous Infusions: . sodium chloride       LOS: 2 days    Time spent: 30 minutes    Amy Shatz Darleen Crocker, DO [facility] Pager [phone]  If 7PM-7AM, please contact night-coverage [email or web address] Password [email or web address] 04/10/2019, 10:55 AM

## 2019-04-11 LAB — BASIC METABOLIC PANEL
Anion gap: 15 (ref 5–15)
BUN: 62 mg/dL — ABNORMAL HIGH (ref 8–23)
CO2: 31 mmol/L (ref 22–32)
Calcium: 8.8 mg/dL — ABNORMAL LOW (ref 8.9–10.3)
Chloride: 92 mmol/L — ABNORMAL LOW (ref 98–111)
Creatinine, Ser: 1.72 mg/dL — ABNORMAL HIGH (ref 0.44–1.00)
GFR calc Af Amer: 35 mL/min — ABNORMAL LOW (ref >60)
GFR calc non Af Amer: 30 mL/min — ABNORMAL LOW (ref >60)
Glucose, Bld: 119 mg/dL — ABNORMAL HIGH (ref 70–99)
Potassium: 3.6 mmol/L (ref 3.5–5.1)
Sodium: 138 mmol/L (ref 135–145)

## 2019-04-11 LAB — PROTIME-INR
INR: 2.7 — ABNORMAL HIGH (ref 0.8–1.2)
Prothrombin Time: 28 seconds — ABNORMAL HIGH (ref 11.4–15.2)

## 2019-04-11 MED ORDER — PREDNISONE 20 MG PO TABS: 40.0000 mg | ORAL_TABLET | Freq: Every day | ORAL | 0 refills | Status: AC

## 2019-04-11 MED ORDER — WARFARIN - PHARMACIST DOSING INPATIENT: Freq: Every day | Status: DC

## 2019-04-11 MED ORDER — WARFARIN SODIUM 2 MG PO TABS: 3.0000 mg | ORAL_TABLET | Freq: Once | ORAL | Status: DC

## 2019-04-11 NOTE — Progress Notes (Signed)
IV removed. D/C instructions reviewed with patient, verbalized understanding. Transported to private vehicle with O2 via wheelchair.

## 2019-04-11 NOTE — Care Management Important Message (Signed)
Important Message  Patient Details  Name: [name] MRN: [id number] Date of Birth: 06-02-1951   Medicare Important Message Given:  Yes     Tommy Medal 04/11/2019, 11:49 AM

## 2019-04-11 NOTE — Progress Notes (Signed)
ANTICOAGULATION CONSULT NOTE - Pharmacy Consult for warfarin Indication: AVR  and atrial fibrillation   Patient Measurements: Height: 5\' 9"  (175.3 cm) Weight: 189 lb 9.5 oz (86 kg) IBW/kg (Calculated) : 66.2   Vital Signs: Temp: 98.5 F (36.9 C) (06/22 0500) Temp Source: Oral (06/22 0500) BP: 111/87 (06/22 0500) Pulse Rate: 76 (06/22 0500)  Labs: Recent Labs    04/08/19 1159 04/09/19 0603 04/10/19 0600 04/11/19 0423  HGB  --  7.6* 8.0*  --   HCT  --  26.0* 26.5*  --   PLT  --  181 208  --   LABPROT  --  38.1* 37.9* 28.0*  INR  --  4.0* 3.9* 2.7*  CREATININE  --  1.81* 1.79* 1.72*  TROPONINI <0.03  --   --   --     Estimated Creatinine Clearance: 37.1 mL/min (A) (by C-G formula based on SCr of 1.72 mg/dL (H)).  Assessment: Pharmacy consulted to dose warfarin in patient with aortic St Jude mechanical valve replacement and history of atrial fibrillation.    Home dose of warfarin listed as 4 mg on Tuesday and 3 mg ROW.  She has a history of chronic, stable anemia.  INR 2.7  Goal of Therapy:  INR 2-3 Monitor platelets by anticoagulation protocol: Yes   Plan:  Warfarin 3 mg x 1 dose. Monitor daily INR and s/s of bleeding.   Margot Ables, PharmD Clinical Pharmacist 04/11/2019 8:43 AM

## 2019-04-11 NOTE — TOC Transition Note (Signed)
Transition of Care [facility]) - CM/SW Discharge Note   Patient Details  Name: ALEXXIS MACKERT MRN: [id number] Date of Birth: 1951-07-21  Transition of Care [facility]) CM/SW Contact:  Steffany Schoenfelder, Chauncey Reading, RN Phone Number: 04/11/2019, 10:20 AM   Clinical Narrative:     Patient discharging home today. Home health ordered, discussed with patient. She is reluctant to agree, but does. Has had [facility] in the past, referral made again to [place]. No other TOC needs.   Final next level of care: Home w Home Health Services Barriers to Discharge: Continued Medical Work up   Patient Goals and CMS Choice   CMS Medicare.gov Compare Post Acute Care list provided to:: Patient Choice offered to / list presented to : Patient   Discharge Plan and Services In-house Referral: Clinical Social Work           HH Arranged: Therapist, sports, PT [facility] Agency: [place] Date [place]: 04/11/19 Time [place]: 44 Representative spoke with at [place]: KAREN  Readmission Risk Interventions Readmission Risk Prevention Plan 04/08/2019  Transportation Screening Complete  SW Recovery Care/Counseling Consult Complete  Some recent data might be hidden

## 2019-04-11 NOTE — Discharge Summary (Signed)
Physician Discharge Summary  [name] RKY:[id number] DOB: 1951-04-07 DOA: 04/07/2019  PCP: Octavio Graves, DO  Admit date: 04/07/2019  Discharge date: 04/11/2019  Admitted From:Home  Disposition:  Home  Recommendations for Outpatient Follow-up:  1. Follow up with PCP in 1-2 weeks 2. Continue on Lasix and metolazone as previously prescribed 3. Repeat BMP in office in 1 week 4. Continue on home 2 L nasal cannula 5. Prednisone taper as prescribed with home breathing treatments  Home Health:Yes with PT/RN  Equipment/Devices:Has home 2L [place] chronically  Discharge Condition:Stable  CODE STATUS: Full  Diet recommendation: Heart Healthy  Brief/Interim Summary: Per HPI: [name],h/o Aortic stenosis s/p AVR 2007([employer]), CAD s/p CABG, Pafib, CKD stage3, Asthma, presents with c/o dyspneax 2 days, slight cough, mostly dry.+ wheezing. Pt denies fever, chills, cp, palp, n/v, abd pain, diarrhea, brbpr, black stool, orthopnea, pnd, lower ext edema. Pt uses o2 2L [place] at home baseline. Pt denies medication, noncompliance  Patient was admitted with acute on chronic hypoxemic respiratory failure secondary to asthma exacerbation as well as mild acute CHF.  She has gradually improved with use of IV Lasix and steroids as well as breathing treatments.  Her lower extremity edema has resolved and she has remained on her baseline 2 L nasal cannula with no respiratory distress, cough, or wheezing.  She states that she is back to her baseline level of functioning and is overall feeling well.  She is stable for discharge with no other acute events otherwise noted during the course of this admission.  She will remain on prednisone taper as well as her prior home dose of Lasix and metolazone with breathing treatments at home.  Follow-up with PCP in 1 to 2 weeks.  2D echocardiogram performed with LVEF 55-65% with no acute abnormalities.  Discharge Diagnoses:  Principal  Problem:   Acute respiratory failure with hypoxia (HCC) Active Problems:   Coronary atherosclerosis of native coronary artery   Atrial fibrillation, chronic   Acute on chronic diastolic congestive heart failure (HCC)   Anemia   Renal failure (ARF), acute on chronic (HCC)   Hypothyroidism   Acute CHF (HCC)   Asthma, chronic, unspecified asthma severity, with acute exacerbation   Acute CHF (congestive heart failure) ([place])  Principal discharge diagnosis: Acute on chronic hypoxemic respiratory failure secondary to acute asthma exacerbation along with mild acute on chronic diastolic CHF decompensation.  Discharge Instructions  Discharge Instructions    Diet - low sodium heart healthy   Complete by: As directed    Increase activity slowly   Complete by: As directed      Allergies as of 04/11/2019      Reactions   Penicillins Hives, Itching, Swelling, Rash   Has patient had a PCN reaction causing immediate rash, facial/tongue/throat swelling, SOB or lightheadedness with hypotension: Yes Has patient had a PCN reaction causing severe rash involving mucus membranes or skin necrosis: Yes Has patient had a PCN reaction that required hospitalization: unknown Has patient had a PCN reaction occurring within the last 10 years: No If all of the above answers are "NO", then may proceed with Cephalosporin use. Throat swelling      Medication List    TAKE these medications   allopurinol 100 MG tablet Commonly known as: ZYLOPRIM Take 100 mg by mouth daily.   ALPRAZolam 0.5 MG tablet Commonly known as: XANAX Take 0.5 mg by mouth 2 (two) times a day.   bisoprolol 10 MG tablet Commonly known as: ZEBETA Take 10  mg by mouth daily.   budesonide-formoterol 160-4.5 MCG/ACT inhaler Commonly known as: Symbicort Inhale 2 puffs into the lungs 2 (two) times daily.   cetirizine 10 MG tablet Commonly known as: ZYRTEC Take 10 mg by mouth daily.   Co Q 10 100 MG Caps Take 1 capsule by mouth  daily.   cyclobenzaprine 10 MG tablet Commonly known as: FLEXERIL Take 10 mg by mouth daily as needed for muscle spasms.   diltiazem 120 MG 24 hr capsule Commonly known as: CARDIZEM CD Take 1 capsule (120 mg total) by mouth daily.   furosemide 80 MG tablet Commonly known as: LASIX Take 80-120 mg by mouth See admin instructions. Takes 120 mg in the am, 80 mg in the pm   HYDROcodone-acetaminophen 10-325 MG tablet Commonly known as: NORCO Take 1 tablet by mouth daily as needed for moderate pain. Normally takes one tablet at bedtime for back pain   hydrOXYzine 25 MG tablet Commonly known as: ATARAX/VISTARIL Take 1 tablet by mouth 3 (three) times daily as needed.   Ipratropium-Albuterol 20-100 MCG/ACT Aers respimat Commonly known as: COMBIVENT Inhale 1-2 puffs into the lungs every 6 (six) hours as needed for wheezing or shortness of breath.   levothyroxine 50 MCG tablet Commonly known as: SYNTHROID Take 50 mcg by mouth daily before breakfast.   magnesium oxide 400 MG tablet Commonly known as: MAG-OX Take 400 mg by mouth daily.   metolazone 2.5 MG tablet Commonly known as: ZAROXOLYN Take 1 tablet (2.5 mg total) by mouth as directed. May take 30 minutes before Lasix if you gain more than 3 lbs in 24 hours What changed:   when to take this  additional instructions   multivitamin with minerals Tabs tablet Take 1 tablet by mouth daily.   omeprazole 20 MG capsule Commonly known as: PRILOSEC Take 20 mg by mouth daily.   OXYGEN Inhale 2 L into the lungs daily.   potassium chloride SA 20 MEQ tablet Commonly known as: K-DUR Take 3 tablets (60 mEq total) by mouth daily. What changed:   how much to take  when to take this   predniSONE 20 MG tablet Commonly known as: Deltasone Take 2 tablets (40 mg total) by mouth daily for 5 days.   rOPINIRole 3 MG tablet Commonly known as: REQUIP Take 3 mg by mouth 2 (two) times a day.   Virtussin AC w/ALC 100-10 MG/5ML  syrup Generic drug: guaiFENesin-codeine Take 5 mLs by mouth 4 (four) times daily as needed for cough or congestion.   warfarin 2 MG tablet Commonly known as: COUMADIN Take as directed. If you are unsure how to take this medication, talk to your nurse or doctor. Original instructions: Take 1.5-2 tablets (3-4 mg total) by mouth See admin instructions. 4mg  on Tuesday and 3 mg daily the rest of the week. What changed: additional instructions      Follow-up Information    Octavio Graves, DO Follow up in 1 week(s).   Contact information: [address] [place] [address] [phone]        Satira Sark, MD .   Specialty: Cardiology Contact information: [place] [address] [phone]          Allergies  Allergen Reactions  . Penicillins Hives, Itching, Swelling and Rash    Has patient had a PCN reaction causing immediate rash, facial/tongue/throat swelling, SOB or lightheadedness with hypotension: Yes Has patient had a PCN reaction causing severe rash involving mucus membranes or skin necrosis:  Yes Has patient had a PCN reaction that required hospitalization: unknown Has patient had a PCN reaction occurring within the last 10 years: No If all of the above answers are "NO", then may proceed with Cephalosporin use.    Throat swelling    Consultations:  None   Procedures/Studies: Dg Chest Port 1 View  Result Date: 04/07/2019 CLINICAL DATA:  Shortness of breath, cough EXAM: PORTABLE CHEST 1 VIEW COMPARISON:  12/28/2018 FINDINGS: Cardiomegaly. Prior median sternotomy and valve replacement. Mild pulmonary vascular congestion and interstitial prominence could reflect interstitial edema. No effusions or acute bony abnormality. IMPRESSION: Cardiomegaly with vascular congestion and interstitial prominence, question interstitial edema. Electronically Signed   By: Rolm Baptise M.D.   On: 04/07/2019 19:26     Discharge Exam: Vitals:    04/11/19 0832 04/11/19 0856  BP:  105/60  Pulse:    Resp:    Temp:    SpO2: 97%    Vitals:   04/10/19 2119 04/11/19 0500 04/11/19 0832 04/11/19 0856  BP: 108/66 111/87  105/60  Pulse: 83 76    Resp: 17 20    Temp: 97.7 F (36.5 C) 98.5 F (36.9 C)    TempSrc: Oral Oral    SpO2: 98% 99% 97%   Weight:  86 kg    Height:        General: Pt is alert, awake, not in acute distress Cardiovascular: RRR, S1/S2 +, no rubs, no gallops Respiratory: CTA bilaterally, no wheezing, no rhonchi, 2 L nasal cannula. Abdominal: Soft, NT, ND, bowel sounds + Extremities: no edema, no cyanosis    The results of significant diagnostics from this hospitalization (including imaging, microbiology, ancillary and laboratory) are listed below for reference.     Microbiology: Recent Results (from the past 240 hour(s))  SARS Coronavirus 2 (CEPHEID - Performed in [place] hospital lab), Hosp Order     Status: None   Collection Time: 04/07/19  9:36 PM   Specimen: Nasopharyngeal Swab  Result Value Ref Range Status   SARS Coronavirus 2 NEGATIVE NEGATIVE Final    Comment: (NOTE) If result is NEGATIVE SARS-CoV-2 target nucleic acids are NOT DETECTED. The SARS-CoV-2 RNA is generally detectable in upper and lower  respiratory specimens during the acute phase of infection. The lowest  concentration of SARS-CoV-2 viral copies this assay can detect is 250  copies / mL. A negative result does not preclude SARS-CoV-2 infection  and should not be used as the sole basis for treatment or other  patient management decisions.  A negative result may occur with  improper specimen collection / handling, submission of specimen other  than nasopharyngeal swab, presence of viral mutation(s) within the  areas targeted by this assay, and inadequate number of viral copies  (<250 copies / mL). A negative result must be combined with clinical  observations, patient history, and epidemiological information. If result is  POSITIVE SARS-CoV-2 target nucleic acids are DETECTED. The SARS-CoV-2 RNA is generally detectable in upper and lower  respiratory specimens dur ing the acute phase of infection.  Positive  results are indicative of active infection with SARS-CoV-2.  Clinical  correlation with patient history and other diagnostic information is  necessary to determine patient infection status.  Positive results do  not rule out bacterial infection or co-infection with other viruses. If result is PRESUMPTIVE POSTIVE SARS-CoV-2 nucleic acids MAY BE PRESENT.   A presumptive positive result was obtained on the submitted specimen  and confirmed on repeat testing.  While 2019 novel coronavirus  (  SARS-CoV-2) nucleic acids may be present in the submitted sample  additional confirmatory testing may be necessary for epidemiological  and / or clinical management purposes  to differentiate between  SARS-CoV-2 and other Sarbecovirus currently known to infect humans.  If clinically indicated additional testing with an alternate test  methodology [id number]) is advised. The SARS-CoV-2 RNA is generally  detectable in upper and lower respiratory sp ecimens during the acute  phase of infection. The expected result is Negative. Fact Sheet for Patients:  StrictlyIdeas.no Fact Sheet for Healthcare Providers: BankingDealers.co.za This test is not yet approved or cleared by the [place] FDA and has been authorized for detection and/or diagnosis of SARS-CoV-2 by FDA under an Emergency Use Authorization (EUA).  This EUA will remain in effect (meaning this test can be used) for the duration of the COVID-19 declaration under Section 564(b)(1) of the Act, 21 U.S.C. section 360bbb-3(b)(1), unless the authorization is terminated or revoked sooner. Performed at [facility], [address], [place],  [address]      Labs: BNP (last 3 results) Recent Labs    09/01/18 1722  12/28/18 1123 04/07/19 1819  BNP 958.0* 652.0* 008.6*   Basic Metabolic Panel: Recent Labs  Lab 04/07/19 1819 04/08/19 0522 04/09/19 0603 04/10/19 0600 04/11/19 0423  NA 139 138 134* 131* 138  K 5.1 4.4 4.1 4.1 3.6  CL 102 99 97* 95* 92*  CO2 27 26 27 26 31   GLUCOSE 96 107* 146* 126* 119*  BUN 48* 49* 56* 58* 62*  CREATININE 2.19* 2.05* 1.81* 1.79* 1.72*  CALCIUM 8.9 8.8* 8.4* 8.2* 8.8*  MG  --   --  2.2 2.2  --    Liver Function Tests: Recent Labs  Lab 04/08/19 0522  AST 31  ALT 19  ALKPHOS 94  BILITOT 1.0  PROT 7.5  ALBUMIN 3.6   No results for input(s): LIPASE, AMYLASE in the last 168 hours. No results for input(s): AMMONIA in the last 168 hours. CBC: Recent Labs  Lab 04/07/19 1819 04/08/19 0522 04/09/19 0603 04/10/19 0600  WBC 9.0 6.5 7.6 9.9  NEUTROABS 6.6  --   --   --   HGB 8.6* 8.6* 7.6* 8.0*  HCT 28.9* 28.9* 26.0* 26.5*  MCV 90.6 90.9 90.3 89.2  PLT 220 195 181 208   Cardiac Enzymes: Recent Labs  Lab 04/07/19 1819 04/07/19 2305 04/08/19 0522 04/08/19 1159  TROPONINI <0.03 <0.03 <0.03 <0.03   BNP: Invalid input(s): POCBNP CBG: Recent Labs  Lab 04/08/19 2115 04/09/19 0732 04/09/19 1112 04/09/19 1602 04/10/19 0719  GLUCAP 209* 126* 135* 139* 112*   D-Dimer No results for input(s): DDIMER in the last 72 hours. Hgb A1c No results for input(s): HGBA1C in the last 72 hours. Lipid Profile No results for input(s): CHOL, HDL, LDLCALC, TRIG, CHOLHDL, LDLDIRECT in the last 72 hours. Thyroid function studies No results for input(s): TSH, T4TOTAL, T3FREE, THYROIDAB in the last 72 hours.  Invalid input(s): FREET3 Anemia work up No results for input(s): VITAMINB12, FOLATE, FERRITIN, TIBC, IRON, RETICCTPCT in the last 72 hours. Urinalysis No results found for: COLORURINE, APPEARANCEUR, [place], [place], GLUCOSEU, [place], [place], KETONESUR, PROTEINUR, UROBILINOGEN, NITRITE, LEUKOCYTESUR Sepsis Labs Invalid input(s): PROCALCITONIN,  WBC,   LACTICIDVEN Microbiology Recent Results (from the past 240 hour(s))  SARS Coronavirus 2 (CEPHEID - Performed in [place] hospital lab), Hosp Order     Status: None   Collection Time: 04/07/19  9:36 PM   Specimen: Nasopharyngeal Swab  Result Value Ref Range Status  SARS Coronavirus 2 NEGATIVE NEGATIVE Final    Comment: (NOTE) If result is NEGATIVE SARS-CoV-2 target nucleic acids are NOT DETECTED. The SARS-CoV-2 RNA is generally detectable in upper and lower  respiratory specimens during the acute phase of infection. The lowest  concentration of SARS-CoV-2 viral copies this assay can detect is 250  copies / mL. A negative result does not preclude SARS-CoV-2 infection  and should not be used as the sole basis for treatment or other  patient management decisions.  A negative result may occur with  improper specimen collection / handling, submission of specimen other  than nasopharyngeal swab, presence of viral mutation(s) within the  areas targeted by this assay, and inadequate number of viral copies  (<250 copies / mL). A negative result must be combined with clinical  observations, patient history, and epidemiological information. If result is POSITIVE SARS-CoV-2 target nucleic acids are DETECTED. The SARS-CoV-2 RNA is generally detectable in upper and lower  respiratory specimens dur ing the acute phase of infection.  Positive  results are indicative of active infection with SARS-CoV-2.  Clinical  correlation with patient history and other diagnostic information is  necessary to determine patient infection status.  Positive results do  not rule out bacterial infection or co-infection with other viruses. If result is PRESUMPTIVE POSTIVE SARS-CoV-2 nucleic acids MAY BE PRESENT.   A presumptive positive result was obtained on the submitted specimen  and confirmed on repeat testing.  While 2019 novel coronavirus  (SARS-CoV-2) nucleic acids may be present in the submitted sample   additional confirmatory testing may be necessary for epidemiological  and / or clinical management purposes  to differentiate between  SARS-CoV-2 and other Sarbecovirus currently known to infect humans.  If clinically indicated additional testing with an alternate test  methodology [phone]) is advised. The SARS-CoV-2 RNA is generally  detectable in upper and lower respiratory sp ecimens during the acute  phase of infection. The expected result is Negative. Fact Sheet for Patients:  StrictlyIdeas.no Fact Sheet for Healthcare Providers: BankingDealers.co.za This test is not yet approved or cleared by the [place] FDA and has been authorized for detection and/or diagnosis of SARS-CoV-2 by FDA under an Emergency Use Authorization (EUA).  This EUA will remain in effect (meaning this test can be used) for the duration of the COVID-19 declaration under Section 564(b)(1) of the Act, 21 U.S.C. section 360bbb-3(b)(1), unless the authorization is terminated or revoked sooner. Performed at [facility], [address], [place], [place] [address]      Time coordinating discharge: 35 minutes  SIGNED:   Rodena Goldmann, DO [facility] 04/11/2019, 9:48 AM  If 7PM-7AM, please contact night-coverage [email or web address] Password [email or web address]

## 2019-04-12 ENCOUNTER — Ambulatory Visit (INDEPENDENT_AMBULATORY_CARE_PROVIDER_SITE_OTHER): Payer: Medicare HMO | Admitting: *Deleted

## 2019-04-12 DIAGNOSIS — I4891 Unspecified atrial fibrillation: Secondary | ICD-10-CM

## 2019-04-12 DIAGNOSIS — Z952 Presence of prosthetic heart valve: Secondary | ICD-10-CM | POA: Diagnosis not present

## 2019-04-12 DIAGNOSIS — Z5181 Encounter for therapeutic drug level monitoring: Secondary | ICD-10-CM | POA: Diagnosis not present

## 2019-04-12 LAB — POCT INR: INR: 2.1 (ref 2.0–3.0)

## 2019-04-12 NOTE — Patient Instructions (Signed)
Continue coumadin 1 1/2 tablet daily except 2 tablets on Tuesdays and Fridays Recheck in 4 weeks.

## 2019-04-13 DIAGNOSIS — I482 Chronic atrial fibrillation, unspecified: Secondary | ICD-10-CM | POA: Diagnosis not present

## 2019-04-13 DIAGNOSIS — J9621 Acute and chronic respiratory failure with hypoxia: Secondary | ICD-10-CM | POA: Diagnosis not present

## 2019-04-13 DIAGNOSIS — D649 Anemia, unspecified: Secondary | ICD-10-CM | POA: Diagnosis not present

## 2019-04-13 DIAGNOSIS — J45901 Unspecified asthma with (acute) exacerbation: Secondary | ICD-10-CM | POA: Diagnosis not present

## 2019-04-13 DIAGNOSIS — G8929 Other chronic pain: Secondary | ICD-10-CM | POA: Diagnosis not present

## 2019-04-13 DIAGNOSIS — I350 Nonrheumatic aortic (valve) stenosis: Secondary | ICD-10-CM | POA: Diagnosis not present

## 2019-04-13 DIAGNOSIS — I2510 Atherosclerotic heart disease of native coronary artery without angina pectoris: Secondary | ICD-10-CM | POA: Diagnosis not present

## 2019-04-13 DIAGNOSIS — M549 Dorsalgia, unspecified: Secondary | ICD-10-CM | POA: Diagnosis not present

## 2019-04-13 DIAGNOSIS — N183 Chronic kidney disease, stage 3 (moderate): Secondary | ICD-10-CM | POA: Diagnosis not present

## 2019-04-13 DIAGNOSIS — I5033 Acute on chronic diastolic (congestive) heart failure: Secondary | ICD-10-CM | POA: Diagnosis not present

## 2019-04-18 DIAGNOSIS — D649 Anemia, unspecified: Secondary | ICD-10-CM | POA: Diagnosis not present

## 2019-04-18 DIAGNOSIS — I482 Chronic atrial fibrillation, unspecified: Secondary | ICD-10-CM | POA: Diagnosis not present

## 2019-04-18 DIAGNOSIS — I5033 Acute on chronic diastolic (congestive) heart failure: Secondary | ICD-10-CM | POA: Diagnosis not present

## 2019-04-18 DIAGNOSIS — M549 Dorsalgia, unspecified: Secondary | ICD-10-CM | POA: Diagnosis not present

## 2019-04-18 DIAGNOSIS — N183 Chronic kidney disease, stage 3 (moderate): Secondary | ICD-10-CM | POA: Diagnosis not present

## 2019-04-18 DIAGNOSIS — I2510 Atherosclerotic heart disease of native coronary artery without angina pectoris: Secondary | ICD-10-CM | POA: Diagnosis not present

## 2019-04-18 DIAGNOSIS — J9621 Acute and chronic respiratory failure with hypoxia: Secondary | ICD-10-CM | POA: Diagnosis not present

## 2019-04-18 DIAGNOSIS — I350 Nonrheumatic aortic (valve) stenosis: Secondary | ICD-10-CM | POA: Diagnosis not present

## 2019-04-18 DIAGNOSIS — J45901 Unspecified asthma with (acute) exacerbation: Secondary | ICD-10-CM | POA: Diagnosis not present

## 2019-04-18 DIAGNOSIS — G8929 Other chronic pain: Secondary | ICD-10-CM | POA: Diagnosis not present

## 2019-04-19 DIAGNOSIS — I5033 Acute on chronic diastolic (congestive) heart failure: Secondary | ICD-10-CM | POA: Diagnosis not present

## 2019-04-19 DIAGNOSIS — J441 Chronic obstructive pulmonary disease with (acute) exacerbation: Secondary | ICD-10-CM | POA: Diagnosis not present

## 2019-04-19 DIAGNOSIS — Z791 Long term (current) use of non-steroidal anti-inflammatories (NSAID): Secondary | ICD-10-CM | POA: Diagnosis not present

## 2019-04-20 DIAGNOSIS — J9621 Acute and chronic respiratory failure with hypoxia: Secondary | ICD-10-CM | POA: Diagnosis not present

## 2019-04-20 DIAGNOSIS — I482 Chronic atrial fibrillation, unspecified: Secondary | ICD-10-CM | POA: Diagnosis not present

## 2019-04-20 DIAGNOSIS — M549 Dorsalgia, unspecified: Secondary | ICD-10-CM | POA: Diagnosis not present

## 2019-04-20 DIAGNOSIS — J45901 Unspecified asthma with (acute) exacerbation: Secondary | ICD-10-CM | POA: Diagnosis not present

## 2019-04-20 DIAGNOSIS — I350 Nonrheumatic aortic (valve) stenosis: Secondary | ICD-10-CM | POA: Diagnosis not present

## 2019-04-20 DIAGNOSIS — G8929 Other chronic pain: Secondary | ICD-10-CM | POA: Diagnosis not present

## 2019-04-20 DIAGNOSIS — I2510 Atherosclerotic heart disease of native coronary artery without angina pectoris: Secondary | ICD-10-CM | POA: Diagnosis not present

## 2019-04-20 DIAGNOSIS — N183 Chronic kidney disease, stage 3 (moderate): Secondary | ICD-10-CM | POA: Diagnosis not present

## 2019-04-20 DIAGNOSIS — D649 Anemia, unspecified: Secondary | ICD-10-CM | POA: Diagnosis not present

## 2019-04-20 DIAGNOSIS — I5033 Acute on chronic diastolic (congestive) heart failure: Secondary | ICD-10-CM | POA: Diagnosis not present

## 2019-04-25 DIAGNOSIS — I38 Endocarditis, valve unspecified: Secondary | ICD-10-CM | POA: Diagnosis not present

## 2019-04-25 DIAGNOSIS — M549 Dorsalgia, unspecified: Secondary | ICD-10-CM | POA: Diagnosis not present

## 2019-04-25 DIAGNOSIS — I2510 Atherosclerotic heart disease of native coronary artery without angina pectoris: Secondary | ICD-10-CM | POA: Diagnosis not present

## 2019-04-25 DIAGNOSIS — J9621 Acute and chronic respiratory failure with hypoxia: Secondary | ICD-10-CM | POA: Diagnosis not present

## 2019-04-25 DIAGNOSIS — I350 Nonrheumatic aortic (valve) stenosis: Secondary | ICD-10-CM | POA: Diagnosis not present

## 2019-04-25 DIAGNOSIS — E782 Mixed hyperlipidemia: Secondary | ICD-10-CM | POA: Diagnosis not present

## 2019-04-25 DIAGNOSIS — I10 Essential (primary) hypertension: Secondary | ICD-10-CM | POA: Diagnosis not present

## 2019-04-25 DIAGNOSIS — I5033 Acute on chronic diastolic (congestive) heart failure: Secondary | ICD-10-CM | POA: Diagnosis not present

## 2019-04-25 DIAGNOSIS — M109 Gout, unspecified: Secondary | ICD-10-CM | POA: Diagnosis not present

## 2019-04-25 DIAGNOSIS — J45901 Unspecified asthma with (acute) exacerbation: Secondary | ICD-10-CM | POA: Diagnosis not present

## 2019-04-25 DIAGNOSIS — I5042 Chronic combined systolic (congestive) and diastolic (congestive) heart failure: Secondary | ICD-10-CM | POA: Diagnosis not present

## 2019-04-25 DIAGNOSIS — I482 Chronic atrial fibrillation, unspecified: Secondary | ICD-10-CM | POA: Diagnosis not present

## 2019-04-25 DIAGNOSIS — G8929 Other chronic pain: Secondary | ICD-10-CM | POA: Diagnosis not present

## 2019-04-25 DIAGNOSIS — D649 Anemia, unspecified: Secondary | ICD-10-CM | POA: Diagnosis not present

## 2019-04-25 DIAGNOSIS — N183 Chronic kidney disease, stage 3 (moderate): Secondary | ICD-10-CM | POA: Diagnosis not present

## 2019-04-25 DIAGNOSIS — E039 Hypothyroidism, unspecified: Secondary | ICD-10-CM | POA: Diagnosis not present

## 2019-04-25 DIAGNOSIS — G2581 Restless legs syndrome: Secondary | ICD-10-CM | POA: Diagnosis not present

## 2019-05-02 DIAGNOSIS — I5033 Acute on chronic diastolic (congestive) heart failure: Secondary | ICD-10-CM | POA: Diagnosis not present

## 2019-05-02 DIAGNOSIS — G8929 Other chronic pain: Secondary | ICD-10-CM | POA: Diagnosis not present

## 2019-05-02 DIAGNOSIS — I482 Chronic atrial fibrillation, unspecified: Secondary | ICD-10-CM | POA: Diagnosis not present

## 2019-05-02 DIAGNOSIS — D649 Anemia, unspecified: Secondary | ICD-10-CM | POA: Diagnosis not present

## 2019-05-02 DIAGNOSIS — J45901 Unspecified asthma with (acute) exacerbation: Secondary | ICD-10-CM | POA: Diagnosis not present

## 2019-05-02 DIAGNOSIS — N183 Chronic kidney disease, stage 3 (moderate): Secondary | ICD-10-CM | POA: Diagnosis not present

## 2019-05-02 DIAGNOSIS — I350 Nonrheumatic aortic (valve) stenosis: Secondary | ICD-10-CM | POA: Diagnosis not present

## 2019-05-02 DIAGNOSIS — M549 Dorsalgia, unspecified: Secondary | ICD-10-CM | POA: Diagnosis not present

## 2019-05-02 DIAGNOSIS — J9621 Acute and chronic respiratory failure with hypoxia: Secondary | ICD-10-CM | POA: Diagnosis not present

## 2019-05-02 DIAGNOSIS — I2510 Atherosclerotic heart disease of native coronary artery without angina pectoris: Secondary | ICD-10-CM | POA: Diagnosis not present

## 2019-05-04 DIAGNOSIS — G8929 Other chronic pain: Secondary | ICD-10-CM | POA: Diagnosis not present

## 2019-05-04 DIAGNOSIS — N183 Chronic kidney disease, stage 3 (moderate): Secondary | ICD-10-CM | POA: Diagnosis not present

## 2019-05-04 DIAGNOSIS — I482 Chronic atrial fibrillation, unspecified: Secondary | ICD-10-CM | POA: Diagnosis not present

## 2019-05-04 DIAGNOSIS — M549 Dorsalgia, unspecified: Secondary | ICD-10-CM | POA: Diagnosis not present

## 2019-05-04 DIAGNOSIS — J45901 Unspecified asthma with (acute) exacerbation: Secondary | ICD-10-CM | POA: Diagnosis not present

## 2019-05-04 DIAGNOSIS — D649 Anemia, unspecified: Secondary | ICD-10-CM | POA: Diagnosis not present

## 2019-05-04 DIAGNOSIS — I350 Nonrheumatic aortic (valve) stenosis: Secondary | ICD-10-CM | POA: Diagnosis not present

## 2019-05-04 DIAGNOSIS — J9621 Acute and chronic respiratory failure with hypoxia: Secondary | ICD-10-CM | POA: Diagnosis not present

## 2019-05-04 DIAGNOSIS — I2510 Atherosclerotic heart disease of native coronary artery without angina pectoris: Secondary | ICD-10-CM | POA: Diagnosis not present

## 2019-05-04 DIAGNOSIS — I5033 Acute on chronic diastolic (congestive) heart failure: Secondary | ICD-10-CM | POA: Diagnosis not present

## 2019-05-05 ENCOUNTER — Emergency Department (HOSPITAL_COMMUNITY): Payer: Medicare HMO

## 2019-05-05 ENCOUNTER — Other Ambulatory Visit: Payer: Self-pay

## 2019-05-05 ENCOUNTER — Observation Stay (HOSPITAL_COMMUNITY)
Admission: EM | Admit: 2019-05-05 | Discharge: 2019-05-06 | Disposition: A | Discharge status: Home / Self Care | Payer: Medicare HMO | Attending: Internal Medicine | Admitting: Internal Medicine

## 2019-05-05 ENCOUNTER — Encounter (HOSPITAL_COMMUNITY): Payer: Self-pay | Admitting: Emergency Medicine

## 2019-05-05 DIAGNOSIS — Z951 Presence of aortocoronary bypass graft: Secondary | ICD-10-CM | POA: Insufficient documentation

## 2019-05-05 DIAGNOSIS — E039 Hypothyroidism, unspecified: Secondary | ICD-10-CM | POA: Insufficient documentation

## 2019-05-05 DIAGNOSIS — Z20828 Contact with and (suspected) exposure to other viral communicable diseases: Secondary | ICD-10-CM | POA: Diagnosis not present

## 2019-05-05 DIAGNOSIS — I509 Heart failure, unspecified: Secondary | ICD-10-CM | POA: Diagnosis not present

## 2019-05-05 DIAGNOSIS — E876 Hypokalemia: Secondary | ICD-10-CM | POA: Diagnosis not present

## 2019-05-05 DIAGNOSIS — I2510 Atherosclerotic heart disease of native coronary artery without angina pectoris: Secondary | ICD-10-CM | POA: Insufficient documentation

## 2019-05-05 DIAGNOSIS — R531 Weakness: Secondary | ICD-10-CM | POA: Diagnosis not present

## 2019-05-05 DIAGNOSIS — Z7901 Long term (current) use of anticoagulants: Secondary | ICD-10-CM | POA: Diagnosis not present

## 2019-05-05 DIAGNOSIS — Z87891 Personal history of nicotine dependence: Secondary | ICD-10-CM | POA: Insufficient documentation

## 2019-05-05 DIAGNOSIS — N183 Chronic kidney disease, stage 3 (moderate): Secondary | ICD-10-CM | POA: Diagnosis not present

## 2019-05-05 DIAGNOSIS — Z79899 Other long term (current) drug therapy: Secondary | ICD-10-CM | POA: Diagnosis not present

## 2019-05-05 DIAGNOSIS — J45909 Unspecified asthma, uncomplicated: Secondary | ICD-10-CM | POA: Diagnosis not present

## 2019-05-05 DIAGNOSIS — R05 Cough: Secondary | ICD-10-CM | POA: Diagnosis not present

## 2019-05-05 LAB — CBC WITH DIFFERENTIAL/PLATELET
Abs Immature Granulocytes: 0.04 10*3/uL (ref 0.00–0.07)
Basophils Absolute: 0 10*3/uL (ref 0.0–0.1)
Basophils Relative: 0 %
Eosinophils Absolute: 0.1 10*3/uL (ref 0.0–0.5)
Eosinophils Relative: 1 %
HCT: 29.5 % — ABNORMAL LOW (ref 36.0–46.0)
Hemoglobin: 9.3 g/dL — ABNORMAL LOW (ref 12.0–15.0)
Immature Granulocytes: 1 %
Lymphocytes Relative: 17 %
Lymphs Abs: 1.4 10*3/uL (ref 0.7–4.0)
MCH: 26.4 pg (ref 26.0–34.0)
MCHC: 31.5 g/dL (ref 30.0–36.0)
MCV: 83.8 fL (ref 80.0–100.0)
Monocytes Absolute: 1 10*3/uL (ref 0.1–1.0)
Monocytes Relative: 12 %
Neutro Abs: 5.7 10*3/uL (ref 1.7–7.7)
Neutrophils Relative %: 69 %
Platelets: 308 10*3/uL (ref 150–400)
RBC: 3.52 MIL/uL — ABNORMAL LOW (ref 3.87–5.11)
RDW: 16.7 % — ABNORMAL HIGH (ref 11.5–15.5)
WBC: 8.2 10*3/uL (ref 4.0–10.5)
nRBC: 0 % (ref 0.0–0.2)

## 2019-05-05 LAB — COMPREHENSIVE METABOLIC PANEL
ALT: 24 U/L (ref 0–44)
AST: 29 U/L (ref 15–41)
Albumin: 3.9 g/dL (ref 3.5–5.0)
Alkaline Phosphatase: 96 U/L (ref 38–126)
Anion gap: 16 — ABNORMAL HIGH (ref 5–15)
BUN: 27 mg/dL — ABNORMAL HIGH (ref 8–23)
CO2: 34 mmol/L — ABNORMAL HIGH (ref 22–32)
Calcium: 9.2 mg/dL (ref 8.9–10.3)
Chloride: 79 mmol/L — ABNORMAL LOW (ref 98–111)
Creatinine, Ser: 1.52 mg/dL — ABNORMAL HIGH (ref 0.44–1.00)
GFR calc Af Amer: 41 mL/min — ABNORMAL LOW (ref >60)
GFR calc non Af Amer: 35 mL/min — ABNORMAL LOW (ref >60)
Glucose, Bld: 102 mg/dL — ABNORMAL HIGH (ref 70–99)
Potassium: 2.2 mmol/L — CL (ref 3.5–5.1)
Sodium: 129 mmol/L — ABNORMAL LOW (ref 135–145)
Total Bilirubin: 0.9 mg/dL (ref 0.3–1.2)
Total Protein: 7.4 g/dL (ref 6.5–8.1)

## 2019-05-05 LAB — PROTIME-INR
INR: 2.8 — ABNORMAL HIGH (ref 0.8–1.2)
Prothrombin Time: 28.8 seconds — ABNORMAL HIGH (ref 11.4–15.2)

## 2019-05-05 LAB — PROCALCITONIN: Procalcitonin: <0.1 ng/mL

## 2019-05-05 LAB — SARS CORONAVIRUS 2 BY RT PCR (HOSPITAL ORDER, PERFORMED IN ~~LOC~~ HOSPITAL LAB): SARS Coronavirus 2: NEGATIVE

## 2019-05-05 MED ORDER — MAGNESIUM OXIDE 400 (241.3 MG) MG PO TABS
400.0000 mg | ORAL_TABLET | Freq: Every day | ORAL | Status: DC
  Administered 2019-05-06: 400 mg via ORAL
  Filled 2019-05-05: qty 1

## 2019-05-05 MED ORDER — SODIUM CHLORIDE 0.9% FLUSH: 3.0000 mL | INTRAVENOUS | Status: DC | PRN

## 2019-05-05 MED ORDER — LEVALBUTEROL HCL 0.63 MG/3ML IN NEBU: 0.6300 mg | INHALATION_SOLUTION | Freq: Four times a day (QID) | RESPIRATORY_TRACT | Status: DC | PRN

## 2019-05-05 MED ORDER — DIPHENHYDRAMINE HCL 25 MG PO CAPS
25.0000 mg | ORAL_CAPSULE | Freq: Once | ORAL | Status: AC
  Administered 2019-05-05: 25 mg via ORAL
  Filled 2019-05-05: qty 1

## 2019-05-05 MED ORDER — PANTOPRAZOLE SODIUM 40 MG PO TBEC
40.0000 mg | DELAYED_RELEASE_TABLET | Freq: Every day | ORAL | Status: DC
  Administered 2019-05-06: 40 mg via ORAL
  Filled 2019-05-05: qty 1

## 2019-05-05 MED ORDER — POTASSIUM CHLORIDE CRYS ER 20 MEQ PO TBCR
40.0000 meq | EXTENDED_RELEASE_TABLET | Freq: Once | ORAL | Status: AC
  Administered 2019-05-05: 40 meq via ORAL
  Filled 2019-05-05: qty 2

## 2019-05-05 MED ORDER — ADULT MULTIVITAMIN W/MINERALS CH
1.0000 | ORAL_TABLET | Freq: Every day | ORAL | Status: DC
  Administered 2019-05-06: 1 via ORAL
  Filled 2019-05-05: qty 1

## 2019-05-05 MED ORDER — MOMETASONE FURO-FORMOTEROL FUM 200-5 MCG/ACT IN AERO
2.0000 | INHALATION_SPRAY | Freq: Two times a day (BID) | RESPIRATORY_TRACT | Status: DC
  Administered 2019-05-06: 2 via RESPIRATORY_TRACT
  Filled 2019-05-05: qty 8.8

## 2019-05-05 MED ORDER — POTASSIUM CHLORIDE 10 MEQ/100ML IV SOLN
10.0000 meq | Freq: Once | INTRAVENOUS | Status: AC
  Administered 2019-05-05: 10 meq via INTRAVENOUS
  Filled 2019-05-05: qty 100

## 2019-05-05 MED ORDER — DILTIAZEM HCL ER COATED BEADS 120 MG PO CP24
120.0000 mg | ORAL_CAPSULE | Freq: Every day | ORAL | Status: DC
  Administered 2019-05-06: 120 mg via ORAL
  Filled 2019-05-05 (×2): qty 1

## 2019-05-05 MED ORDER — FUROSEMIDE 80 MG PO TABS
80.0000 mg | ORAL_TABLET | Freq: Every evening | ORAL | Status: DC
  Administered 2019-05-05: 80 mg via ORAL
  Filled 2019-05-05: qty 1

## 2019-05-05 MED ORDER — WARFARIN SODIUM 3 MG PO TABS: 3.0000 mg | ORAL_TABLET | ORAL | Status: DC

## 2019-05-05 MED ORDER — LEVOTHYROXINE SODIUM 50 MCG PO TABS
50.0000 ug | ORAL_TABLET | Freq: Every day | ORAL | Status: DC
  Administered 2019-05-06: 50 ug via ORAL
  Filled 2019-05-05: qty 1

## 2019-05-05 MED ORDER — BISOPROLOL FUMARATE 5 MG PO TABS
10.0000 mg | ORAL_TABLET | Freq: Every day | ORAL | Status: DC
  Administered 2019-05-06: 10 mg via ORAL
  Filled 2019-05-05 (×2): qty 2

## 2019-05-05 MED ORDER — LORATADINE 10 MG PO TABS
10.0000 mg | ORAL_TABLET | Freq: Every day | ORAL | Status: DC
  Administered 2019-05-06: 10 mg via ORAL
  Filled 2019-05-05: qty 1

## 2019-05-05 MED ORDER — METOLAZONE 2.5 MG PO TABS: 2.5000 mg | ORAL_TABLET | Freq: Two times a day (BID) | ORAL | Status: DC | PRN

## 2019-05-05 MED ORDER — ALBUTEROL SULFATE HFA 108 (90 BASE) MCG/ACT IN AERS
2.0000 | INHALATION_SPRAY | RESPIRATORY_TRACT | Status: DC | PRN
  Administered 2019-05-05: 2 via RESPIRATORY_TRACT
  Filled 2019-05-05: qty 6.7

## 2019-05-05 MED ORDER — POTASSIUM CHLORIDE 10 MEQ/100ML IV SOLN
10.0000 meq | INTRAVENOUS | Status: AC
  Administered 2019-05-05 (×6): 10 meq via INTRAVENOUS
  Filled 2019-05-05 (×6): qty 100

## 2019-05-05 MED ORDER — SODIUM CHLORIDE 0.9 % IV SOLN: 250.0000 mL | INTRAVENOUS | Status: DC | PRN

## 2019-05-05 MED ORDER — DM-GUAIFENESIN ER 30-600 MG PO TB12
1.0000 | ORAL_TABLET | Freq: Two times a day (BID) | ORAL | Status: DC
  Administered 2019-05-05 – 2019-05-06 (×2): 1 via ORAL
  Filled 2019-05-05 (×2): qty 1

## 2019-05-05 MED ORDER — SODIUM CHLORIDE 0.9% FLUSH
3.0000 mL | Freq: Two times a day (BID) | INTRAVENOUS | Status: DC
  Administered 2019-05-06: 3 mL via INTRAVENOUS

## 2019-05-05 MED ORDER — ACETAMINOPHEN 325 MG PO TABS
650.0000 mg | ORAL_TABLET | Freq: Four times a day (QID) | ORAL | Status: DC | PRN
  Administered 2019-05-05: 650 mg via ORAL
  Filled 2019-05-05: qty 2

## 2019-05-05 MED ORDER — FUROSEMIDE 80 MG PO TABS
120.0000 mg | ORAL_TABLET | Freq: Every day | ORAL | Status: DC
  Administered 2019-05-06: 120 mg via ORAL
  Filled 2019-05-05 (×2): qty 1

## 2019-05-05 MED ORDER — ONDANSETRON HCL 4 MG PO TABS: 4.0000 mg | ORAL_TABLET | Freq: Four times a day (QID) | ORAL | Status: DC | PRN

## 2019-05-05 MED ORDER — ACETAMINOPHEN 650 MG RE SUPP: 650.0000 mg | Freq: Four times a day (QID) | RECTAL | Status: DC | PRN

## 2019-05-05 MED ORDER — POTASSIUM CHLORIDE CRYS ER 20 MEQ PO TBCR
40.0000 meq | EXTENDED_RELEASE_TABLET | Freq: Three times a day (TID) | ORAL | Status: DC
  Administered 2019-05-05 – 2019-05-06 (×2): 40 meq via ORAL
  Filled 2019-05-05 (×2): qty 2

## 2019-05-05 MED ORDER — ALLOPURINOL 100 MG PO TABS
100.0000 mg | ORAL_TABLET | Freq: Every day | ORAL | Status: DC
  Administered 2019-05-06: 100 mg via ORAL
  Filled 2019-05-05 (×2): qty 1

## 2019-05-05 MED ORDER — CO Q 10 100 MG PO CAPS: 1.0000 | ORAL_CAPSULE | Freq: Every day | ORAL | Status: DC

## 2019-05-05 MED ORDER — METHYLPREDNISOLONE SODIUM SUCC 125 MG IJ SOLR
125.0000 mg | Freq: Once | INTRAMUSCULAR | Status: AC
  Administered 2019-05-05: 125 mg via INTRAVENOUS
  Filled 2019-05-05: qty 2

## 2019-05-05 MED ORDER — ALPRAZOLAM 0.5 MG PO TABS
0.5000 mg | ORAL_TABLET | Freq: Two times a day (BID) | ORAL | Status: DC
  Administered 2019-05-05 – 2019-05-06 (×2): 0.5 mg via ORAL
  Filled 2019-05-05 (×2): qty 1

## 2019-05-05 MED ORDER — ONDANSETRON HCL 4 MG/2ML IJ SOLN: 4.0000 mg | Freq: Four times a day (QID) | INTRAMUSCULAR | Status: DC | PRN

## 2019-05-05 MED ORDER — CYCLOBENZAPRINE HCL 10 MG PO TABS
10.0000 mg | ORAL_TABLET | Freq: Once | ORAL | Status: DC | PRN
  Filled 2019-05-05: qty 1

## 2019-05-05 NOTE — ED Notes (Signed)
CRITICAL VALUE ALERT  Critical Value:  K 2.2   Date & Time Notied:  05/05/2019 1207   Provider Notified: Dr. Roderic Palau   Orders Received/Actions taken: None yet

## 2019-05-05 NOTE — ED Provider Notes (Signed)
[facility] EMERGENCY DEPARTMENT Provider Note   CSN: [id number] Arrival date & time: 05/05/19  1036     History   Chief Complaint Chief Complaint  Patient presents with   Cough    HPI [name] is a 67 y.o. female.     Patient complains of cough and weakness.  She has yellow sputum production cough  The history is provided by the patient. No language interpreter was used.  Cough Cough characteristics:  Productive Sputum characteristics:  Yellow Severity:  Moderate Onset quality:  Sudden Timing:  Constant Progression:  Worsening Chronicity:  New Smoker: no   Context: not animal exposure   Relieved by:  Nothing Associated symptoms: no chest pain, no eye discharge, no headaches and no rash     Past Medical History:  Diagnosis Date   Acute on chronic diastolic (congestive) heart failure (HCC)    Anxiety disorder    Aortic stenosis    Status post St. Jude mechanical AVR 2007   Asthma    Atrial fibrillation ([place])    Carcinoid tumor of colon 2007   Coronary atherosclerosis of native coronary artery    Status post CABG 2007   GERD (gastroesophageal reflux disease)    History of colonoscopy 2003   Dr. Gala Romney - normal   Hyperlipidemia    Macromastia    Pedal edema    Bilateral, chronic   Peptic stricture of esophagus 10/04/2010   GE junction on last EGD by Dr. Gala Romney, benign biopsies   RLS (restless legs syndrome)    Schatzki's ring     Patient Active Problem List   Diagnosis Date Noted   Acute hypokalemia 05/05/2019   Acute CHF (congestive heart failure) ([place]) 04/08/2019   Acute CHF ([place]) 04/07/2019   Asthma, chronic, unspecified asthma severity, with acute exacerbation 04/07/2019   Benign essential HTN    CHF (congestive heart failure) ([place]) 07/26/2018   Transaminitis 07/23/2018   Acute on chronic respiratory failure with hypoxia and hypercapnia ([place]) 06/19/2018   Acute respiratory failure with hypoxia ([place])     Supratherapeutic INR 05/03/2018   CHF exacerbation ([place]) 78/29/5621   Acute metabolic encephalopathy 30/86/5784   CKD (chronic kidney disease) stage 3, GFR 30-59 ml/min ([place]) 05/02/2018   Hypothyroidism 05/02/2018   Generalized weakness 05/02/2018   Physical deconditioning    Coronary artery disease of bypass graft of native heart with stable angina pectoris ([place])    Renal failure (ARF), acute on chronic ([place])    Acute on chronic respiratory failure with hypercapnia ([place]) 03/24/2018   Hypomagnesemia 03/24/2018   Hypokalemia 03/24/2018   Hypophosphatemia 03/24/2018   Atrial fibrillation with RVR ([place]) 03/24/2018   Elevated troponin 03/24/2018   Coughing 01/14/2018   Anemia 01/14/2018   Hx of adenomatous colonic polyps 01/14/2018   Acute on chronic diastolic congestive heart failure ([place]) 12/17/2016   COPD with acute exacerbation ([place]) 12/17/2016   Bilateral leg edema 07/05/2014   Encounter for therapeutic drug monitoring 11/15/2013   Chronic venous insufficiency 08/26/2011   Chronic anticoagulation 01/11/2011   Peptic stricture of esophagus 10/04/2010   GERD 09/16/2010   DYSPHAGIA 09/16/2010   H/O heart valve replacement with mechanical valve 06/05/2009   Hyperlipidemia 06/04/2009   Coronary atherosclerosis of native coronary artery 06/04/2009   Atrial fibrillation, chronic 06/04/2009    Past Surgical History:  Procedure Laterality Date   ABDOMINAL HYSTERECTOMY     AORTIC VALVE REPLACEMENT  2007   #25 mm St. Jude mechanical prosthesis with Hemashield tube graft repair  of ascending aneurysm   APPENDECTOMY  2007   BACK SURGERY     lumbar 4 and 5    BIOPSY  02/05/2012   RMR:Two tongues of salmon-colored epithelium distal esophagus, very short-segment Barrett's s/p bx/Small hiatal hernia, otherwise normal stomach, D1, D2. Status post esophageal dilation. Biopsy showed GERD.   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 Gala Romney- schatzki's ring, shoft peptic stricture at GE junction.   ESOPHAGOGASTRODUODENOSCOPY (EGD) WITH PROPOFOL N/A 02/08/2018   Procedure: ESOPHAGOGASTRODUODENOSCOPY (EGD) WITH PROPOFOL;  Surgeon: Daneil Dolin, MD;  Location: [facility] ENDO SUITE;  Service: Endoscopy;  Laterality: N/A;  8:15am   FOOT SURGERY     bilateral bunionectomy   HERNIA REPAIR     with mesh   LAPAROTOMY  2007   small bowel resection secondary to small bowel obstruction   MALONEY DILATION  02/05/2012   Procedure: Venia Minks DILATION;  Surgeon: Daneil Dolin, MD;  Location: [facility] ORS;  Service: Endoscopy;  Laterality: N/A;  84mm    MALONEY DILATION N/A 02/08/2018   Procedure: Venia Minks DILATION;  Surgeon: Daneil Dolin, MD;  Location: [facility] ENDO SUITE;  Service: Endoscopy;  Laterality: N/A;   Teeth removal       OB History    Gravida  3   Para  3   Term  3   Preterm      AB      Living  1     SAB      TAB      Ectopic      Multiple      Live Births               Home Medications    Prior to Admission medications   Medication Sig Start Date End Date Taking? Authorizing Provider  allopurinol (ZYLOPRIM) 100 MG tablet Take 100 mg by mouth daily.   Yes [provider]  ALPRAZolam Duanne Moron) 0.5 MG tablet Take 0.5 mg by mouth 2 (two) times a day. 03/03/19  Yes [provider]  bisoprolol (ZEBETA) 10 MG tablet Take 10 mg by mouth daily.  10/05/18  Yes [provider]  budesonide-formoterol (SYMBICORT) 160-4.5 MCG/ACT inhaler Inhale 2 puffs into the lungs 2 (two) times daily. 03/31/18  Yes Barton Dubois, MD  cetirizine (ZYRTEC) 10 MG tablet Take 10 mg by mouth daily.      Yes [provider]  Coenzyme Q10 (CO Q 10) 100 MG CAPS Take 1 capsule by mouth daily.   Yes [provider]  diltiazem (CARDIZEM CD) 120 MG 24 hr capsule Take 1 capsule (120 mg total) by mouth daily. 04/01/18  Yes Barton Dubois, MD  furosemide (LASIX) 80 MG tablet Take 80-120 mg by mouth See admin instructions. Takes 120 mg in the am, 80 mg in the pm   Yes [provider]  Ipratropium-Albuterol (COMBIVENT) 20-100 MCG/ACT AERS respimat Inhale 1-2 puffs into the lungs every 6 (six) hours as needed for wheezing or shortness of breath. 09/05/18  Yes Barton Dubois, MD  levothyroxine (SYNTHROID, LEVOTHROID) 50 MCG tablet Take 50 mcg by mouth daily before  breakfast.    Yes [provider]  magnesium oxide (MAG-OX) 400 MG tablet Take 400 mg by mouth daily.   Yes [provider]  Multiple Vitamin (MULTIVITAMIN WITH MINERALS) TABS tablet Take 1 tablet by mouth daily.   Yes [provider]  omeprazole (PRILOSEC) 20 MG capsule Take 20 mg by mouth daily.    Yes [provider]  OXYGEN Inhale 2 L into the lungs daily.    Yes [provider]  potassium chloride SA (K-DUR) 20 MEQ tablet Take 3 tablets (60 mEq total) by mouth daily. Patient taking differently: Take 20 mEq by mouth 3 (three) times daily.  04/01/19  Yes Satira Sark, MD  rOPINIRole (REQUIP) 3 MG tablet Take 3 mg by mouth daily.    Yes [provider]  metolazone (ZAROXOLYN) 2.5 MG tablet Take 1 tablet (2.5 mg total) by mouth as directed. May take 30 minutes before Lasix if you gain more than 3 lbs in 24 hours Patient not taking: Reported on 05/05/2019 02/04/19   Satira Sark, MD  warfarin (COUMADIN) 2 MG tablet Take 1.5-2 tablets (3-4 mg total) by mouth See admin instructions. 4mg  on Tuesday and 3 mg daily the rest of the week. Patient taking differently: Take 3-4 mg by mouth See admin instructions. 3mg  on Sun, Mon, Wed, Sat, then take 2mg  on All other days  09/06/18   Barton Dubois, MD    Family History Family History  Problem Relation Age of Onset   Stroke Mother    Cancer Father    Anesthesia problems Neg Hx    Hypotension Neg Hx    Malignant hyperthermia Neg Hx    Pseudochol deficiency Neg Hx     Social History Social History   Tobacco Use   Smoking status: Former Smoker    Packs/day: 1.00    Years: 20.00    Pack years: 20.00    Types: Cigarettes    Quit date: 10/20/1988    Years since quitting: 30.5   Smokeless tobacco: Never Used  Substance Use Topics   Alcohol use: No    Alcohol/week: 0.0 standard drinks   Drug use: No     Allergies   Penicillins   Review of Systems Review of Systems  Constitutional: Negative for appetite change and fatigue.  HENT: Negative for congestion, ear discharge and sinus pressure.   Eyes: Negative for discharge.  Respiratory: Positive for cough.   Cardiovascular: Negative for chest pain.  Gastrointestinal: Negative for abdominal pain and diarrhea.  Genitourinary: Negative for frequency and hematuria.  Musculoskeletal: Negative for back pain.  Skin: Negative for rash.  Neurological: Positive for weakness. Negative for seizures and headaches.  Psychiatric/Behavioral: Negative for hallucinations.     Physical Exam Updated Vital Signs BP 119/76    Pulse 71    Temp 97.9 F (36.6 C)    Resp 20    SpO2 100%   Physical Exam Constitutional:      Appearance: She is well-developed.  HENT:     Head: Normocephalic.     Nose: Nose normal.  Eyes:     General: No scleral icterus.    Conjunctiva/sclera: Conjunctivae normal.  Neck:     Musculoskeletal: Neck supple.     Thyroid: No thyromegaly.  Cardiovascular:     Rate and Rhythm: Normal rate and regular rhythm.     Heart sounds: No murmur. No friction rub. No gallop.   Pulmonary:     Breath sounds: No stridor. No wheezing or  rales.  Chest:     Chest wall: No tenderness.  Abdominal:     General: There is no distension.       Tenderness: There is no abdominal tenderness. There is no rebound.  Musculoskeletal: Normal range of motion.  Lymphadenopathy:     Cervical: No cervical adenopathy.  Skin:    Findings: No erythema or rash.  Neurological:     Mental Status: She is oriented to person, place, and time.     Motor: No abnormal muscle tone.     Coordination: Coordination normal.  Psychiatric:        Behavior: Behavior normal.      ED Treatments / Results  Labs (all labs ordered are listed, but only abnormal results are displayed) Labs Reviewed  CBC WITH DIFFERENTIAL/PLATELET - Abnormal; Notable for the following components:      Result Value   RBC 3.52 (*)    Hemoglobin 9.3 (*)    HCT 29.5 (*)    RDW 16.7 (*)    All other components within normal limits  COMPREHENSIVE METABOLIC PANEL - Abnormal; Notable for the following components:   Sodium 129 (*)    Potassium 2.2 (*)    Chloride 79 (*)    CO2 34 (*)    Glucose, Bld 102 (*)    BUN 27 (*)    Creatinine, Ser 1.52 (*)    GFR calc non Af Amer 35 (*)    GFR calc Af Amer 41 (*)    Anion gap 16 (*)    All other components within normal limits  SARS CORONAVIRUS 2 (HOSPITAL ORDER, [place] [facility])    EKG None  Radiology Dg Chest Portable 1 View  Result Date: 05/05/2019 CLINICAL DATA:  Productive cough and shortness-of-breath 2 days. EXAM: PORTABLE CHEST 1 VIEW COMPARISON:  04/07/2019 FINDINGS: Sternotomy wires unchanged. Lungs are adequately inflated with no focal lobar consolidation or effusion. There is mild hazy prominence of the central pulmonary vessels unchanged likely mild vascular congestion. Stable cardiomegaly. Remainder the exam is unchanged. IMPRESSION: Cardiomegaly with suggestion of mild stable vascular congestion. Electronically Signed   By: Marin Olp M.D.   On: 05/05/2019 12:16    Procedures Procedures (including critical care time)  Medications Ordered in ED Medications  albuterol (VENTOLIN  HFA) 108 (90 Base) MCG/ACT inhaler 2 puff (2 puffs Inhalation Given 05/05/19 1138)  potassium chloride 10 mEq in 100 mL IVPB ( Intravenous Rate/Dose Verify 05/05/19 1425)  methylPREDNISolone sodium succinate (SOLU-MEDROL) 125 mg/2 mL injection 125 mg (125 mg Intravenous Given 05/05/19 1138)  potassium chloride 10 mEq in 100 mL IVPB (0 mEq Intravenous Stopped 05/05/19 1335)  potassium chloride SA (K-DUR) CR tablet 40 mEq (40 mEq Oral Given 05/05/19 1244)  diphenhydrAMINE (BENADRYL) capsule 25 mg (25 mg Oral Given 05/05/19 1244)     Initial Impression / Assessment and Plan / ED Course  I have reviewed the triage vital signs and the nursing notes.  Pertinent labs & imaging results that were available during my care of the patient were reviewed by me and considered in my medical decision making (see chart for details). CRITICAL CARE Performed by: Milton Ferguson Total critical care time: 23minutes Critical care time was exclusive of separately billable procedures and treating other patients. Critical care was necessary to treat or prevent imminent or life-threatening deterioration. Critical care was time spent personally by me on the following activities: development of treatment plan with patient and/or surrogate as well as nursing, discussions with  consultants, evaluation of patient's response to treatment, examination of patient, obtaining history from patient or surrogate, ordering and performing treatments and interventions, ordering and review of laboratory studies, ordering and review of radiographic studies, pulse oximetry and re-evaluation of patient's condition.        Patient with hypokalemia.  She will be admitted for potassium replacement Final Clinical Impressions(s) / ED Diagnoses   Final diagnoses:  Hypokalemia    ED Discharge Orders    None       Milton Ferguson, MD 05/05/19 1432

## 2019-05-05 NOTE — Progress Notes (Signed)
ANTICOAGULATION CONSULT NOTE - Initial Consult  Pharmacy Consult for warfarin Indication: Mechanical valve + afib  Allergies  Allergen Reactions  . Penicillins Hives, Itching, Swelling and Rash    Has patient had a PCN reaction causing immediate rash, facial/tongue/throat swelling, SOB or lightheadedness with hypotension: Yes Has patient had a PCN reaction causing severe rash involving mucus membranes or skin necrosis: Yes Has patient had a PCN reaction that required hospitalization: unknown Has patient had a PCN reaction occurring within the last 10 years: No If all of the above answers are "NO", then may proceed with Cephalosporin use.    Throat swelling    Patient Measurements:     Vital Signs: Temp: 97.9 F (36.6 C) (07/16 1054) BP: 119/81 (07/16 1530) Pulse Rate: 71 (07/16 1414)  Labs: Recent Labs    05/05/19 1130  HGB 9.3*  HCT 29.5*  PLT 308  LABPROT 28.8*  INR 2.8*  CREATININE 1.52*    CrCl cannot be calculated (Unknown ideal weight.).   Medical History: Past Medical History:  Diagnosis Date  . Acute on chronic diastolic (congestive) heart failure ([place])   . Anxiety disorder   . Aortic stenosis    Status post St. Jude mechanical AVR 2007  . Asthma   . Atrial fibrillation ([place])   . Carcinoid tumor of colon 2007  . Coronary atherosclerosis of native coronary artery    Status post CABG 2007  . GERD (gastroesophageal reflux disease)   . History of colonoscopy 2003   Dr. Gala Romney - normal  . Hyperlipidemia   . Macromastia   . Pedal edema    Bilateral, chronic  . Peptic stricture of esophagus 10/04/2010   GE junction on last EGD by Dr. Gala Romney, benign biopsies  . RLS (restless legs syndrome)   . Schatzki's ring     Medications:  (Not in a hospital admission)   Assessment: Pharmacy consulted to dose warfarin in patient with mechanical valve and atrial fibrillation.  Patient's listed home dose is 4 mg on Tuesday and 3 mg ROW per anticoagulation  note.  Patient unable to tell us if she had a dose today with INR on admission of 2.8.  Goal of Therapy:  INR 2-3 Monitor platelets by anticoagulation protocol: Yes   Plan:  Due to INR on higher side of range and patient/husband unable to tell us if she had today's dose we will hold dose and start on 7/17.  Ramond Craver 05/05/2019,4:01 PM

## 2019-05-05 NOTE — ED Triage Notes (Signed)
Pt states that she has been coughing. Her health care aid wanted her to get checked. She has been coughing up green phelgem 2 days ago but now it is clear.

## 2019-05-05 NOTE — H&P (Signed)
History and Physical    [name] LPF:[id number] DOB: 21-Mar-1951 DOA: 05/05/2019  PCP: Octavio Graves, DO (Inactive)   Patient coming from: Home  Chief Complaint: Mild cough with sputum production  HPI: [name] is a 68 y.o. female with medical history significant for paroxysmal atrial fibrillation, CAD status post CABG, CKD stage III, asthma, and aortic stenosis status post AVR 2007-Saint Jude, and chronic hypoxemia on 2 L nasal cannula at home.  She presented to the emergency department with 2 days worth of some mild dyspnea as well as what sounds like productive cough particularly at night.  She denies any fevers or chills.  She denies any shortness of breath with exertion, chest pain, lower extremity edema, orthopnea, or paroxysmal nocturnal dyspnea.  She has had a recent 2D echocardiogram with LVEF 55-65% on recent admission with no other abnormalities noted.  She states that she has been taking her home medications with the exception of a few which would include her potassium.  She states that she has not taken her potassium over the last 2 to 3 weeks and does continue to take her Lasix at home.   ED Course: Vital signs are stable and patient is afebrile.  Laboratory data remarkable for potassium 2.2 and sodium 129.  BUN 27 and creatinine 1.52 which is at baseline.  Chest x-ray with no significant findings aside from some mild vascular congestion and stable cardiomegaly noted.  COVID testing negative and no leukocytosis noted.  Review of Systems: All others reviewed and otherwise negative.  Past Medical History:  Diagnosis Date  . Acute on chronic diastolic (congestive) heart failure ([place])   . Anxiety disorder   . Aortic stenosis    Status post St. Jude mechanical AVR 2007  . Asthma   . Atrial fibrillation ([place])   . Carcinoid tumor of colon 2007  . Coronary atherosclerosis of native coronary artery    Status post CABG 2007  . GERD (gastroesophageal reflux disease)   .  History of colonoscopy 2003   Dr. Gala Romney - normal  . Hyperlipidemia   . Macromastia   . Pedal edema    Bilateral, chronic  . Peptic stricture of esophagus 10/04/2010   GE junction on last EGD by Dr. Gala Romney, benign biopsies  . RLS (restless legs syndrome)   . Schatzki's ring     Past Surgical History:  Procedure Laterality Date  . ABDOMINAL HYSTERECTOMY    . AORTIC VALVE REPLACEMENT  2007   #25 mm St. 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 APPENDECTOMY  2007  . BACK SURGERY     lumbar 4 and 5   . BIOPSY  02/05/2012   RMR:Two tongues of salmon-colored epithelium distal esophagus, very short-segment Barrett's s/p bx/Small hiatal hernia, otherwise normal stomach, D1, D2. Status post esophageal dilation. Biopsy showed GERD.  . Breast cyst removed     bilateral  . BREAST REDUCTION SURGERY    . CESAREAN SECTION    . 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 COLONOSCOPY  02/05/2012   ZHG:DJMEQA rectum, sigmoid diverticulosis,descending colon polyp , tubular adenoma  . CORONARY ARTERY BYPASS GRAFT     06/2006 - RIMA to RCA, SVG to RCA  . ESOPHAGOGASTRODUODENOSCOPY  10/03/2010   Dr. Gala Romney- schatzki's ring, shoft peptic stricture at GE junction.  . ESOPHAGOGASTRODUODENOSCOPY (EGD) WITH PROPOFOL N/A 02/08/2018   Procedure: ESOPHAGOGASTRODUODENOSCOPY (EGD) WITH PROPOFOL;  Surgeon: Daneil Dolin, MD;  Location:  [facility];  Service: Endoscopy;  Laterality: N/A;  8:15am  . FOOT SURGERY     bilateral bunionectomy  . HERNIA REPAIR     with mesh  . LAPAROTOMY  2007   small bowel resection secondary to small bowel obstruction  . MALONEY DILATION  02/05/2012   Procedure: Venia Minks DILATION;  Surgeon: Daneil Dolin, MD;  Location: [facility];  Service: Endoscopy;  Laterality: N/A;  14mm   . MALONEY DILATION N/A 02/08/2018   Procedure: Venia Minks DILATION;  Surgeon: Daneil Dolin, MD;  Location: [facility];  Service: Endoscopy;   Laterality: N/A;  . Teeth removal       reports that she quit smoking about 30 years ago. Her smoking use included cigarettes. She has a 20.00 pack-year smoking history. She has never used smokeless tobacco. She reports that she does not drink alcohol or use drugs.  Allergies  Allergen Reactions  . Penicillins Hives, Itching, Swelling and Rash    Has patient had a PCN reaction causing immediate rash, facial/tongue/throat swelling, SOB or lightheadedness with hypotension: Yes Has patient had a PCN reaction causing severe rash involving mucus membranes or skin necrosis: Yes Has patient had a PCN reaction that required hospitalization: unknown Has patient had a PCN reaction occurring within the last 10 years: No If all of the above answers are "NO", then may proceed with Cephalosporin use.    Throat swelling    Family History  Problem Relation Age of Onset  . Stroke Mother   . Cancer Father   . Anesthesia problems Neg Hx   . Hypotension Neg Hx   . Malignant hyperthermia Neg Hx   . Pseudochol deficiency Neg Hx     Prior to Admission medications   Medication Sig Start Date End Date Taking? Authorizing Provider  allopurinol (ZYLOPRIM) 100 MG tablet Take 100 mg by mouth daily.   Yes [provider]  ALPRAZolam Duanne Moron) 0.5 MG tablet Take 0.5 mg by mouth 2 (two) times a day. 03/03/19  Yes [provider]  bisoprolol (ZEBETA) 10 MG tablet Take 10 mg by mouth daily.  10/05/18  Yes [provider]  budesonide-formoterol (SYMBICORT) 160-4.5 MCG/ACT inhaler Inhale 2 puffs into the lungs 2 (two) times daily. 03/31/18  Yes Barton Dubois, MD  cetirizine (ZYRTEC) 10 MG tablet Take 10 mg by mouth daily.     Yes [provider]  Coenzyme Q10 (CO Q 10) 100 MG CAPS Take 1 capsule by mouth daily.   Yes [provider]  diltiazem (CARDIZEM CD) 120 MG 24 hr capsule Take 1 capsule (120 mg total) by mouth daily. 04/01/18  Yes Barton Dubois, MD  furosemide  (LASIX) 80 MG tablet Take 80-120 mg by mouth See admin instructions. Takes 120 mg in the am, 80 mg in the pm   Yes [provider]  Ipratropium-Albuterol (COMBIVENT) 20-100 MCG/ACT AERS respimat Inhale 1-2 puffs into the lungs every 6 (six) hours as needed for wheezing or shortness of breath. 09/05/18  Yes Barton Dubois, MD  levothyroxine (SYNTHROID, LEVOTHROID) 50 MCG tablet Take 50 mcg by mouth daily before breakfast.    Yes [provider]  magnesium oxide (MAG-OX) 400 MG tablet Take 400 mg by mouth daily.   Yes [provider]  Multiple Vitamin (MULTIVITAMIN WITH MINERALS) TABS tablet Take 1 tablet by mouth daily.   Yes [provider]  omeprazole (PRILOSEC) 20 MG capsule Take 20 mg by mouth daily.    Yes [provider]  OXYGEN Inhale 2 L into the lungs daily.    Yes [provider]  potassium chloride SA (K-DUR) 20 MEQ tablet Take 3 tablets (60 mEq total) by mouth daily. Patient taking differently: Take 20 mEq by mouth 3 (three) times daily.  04/01/19  Yes Satira Sark, MD  rOPINIRole (REQUIP) 3 MG tablet Take 3 mg by mouth daily.    Yes [provider]  metolazone (ZAROXOLYN) 2.5 MG tablet Take 1 tablet (2.5 mg total) by mouth as directed. May take 30 minutes before Lasix if you gain more than 3 lbs in 24 hours Patient not taking: Reported on 05/05/2019 02/04/19   Satira Sark, MD  warfarin (COUMADIN) 2 MG tablet Take 1.5-2 tablets (3-4 mg total) by mouth See admin instructions. 4mg  on Tuesday and 3 mg daily the rest of the week. Patient not taking: Reported on 05/05/2019 09/06/18   Barton Dubois, MD    Physical Exam: Vitals:   05/05/19 1331 05/05/19 1414 05/05/19 1424 05/05/19 1430  BP: 122/78 119/76  (!) 120/98  Pulse: 71 71    Resp: 17 17 20  (!) 27  Temp:      SpO2: 97% 97% 100% 99%    Constitutional: NAD, calm, comfortable Vitals:   05/05/19 1331 05/05/19 1414 05/05/19 1424 05/05/19 1430  BP: 122/78  119/76  (!) 120/98  Pulse: 71 71    Resp: 17 17 20  (!) 27  Temp:      SpO2: 97% 97% 100% 99%   Eyes: lids and conjunctivae normal ENMT: Mucous membranes are moist.  Neck: normal, supple Respiratory: clear to auscultation bilaterally. Normal respiratory effort. No accessory muscle use.  On 2 L nasal cannula. Cardiovascular: Iregular rate and rhythm, no murmurs. No extremity edema. Abdomen: no tenderness, no distention. Bowel sounds positive.  Musculoskeletal:  No joint deformity upper and lower extremities.   Skin: no rashes, lesions, ulcers.  Psychiatric: Normal judgment and insight. Alert and oriented x 3. Normal mood.   Labs on Admission: I have personally reviewed following labs and imaging studies  CBC: Recent Labs  Lab 05/05/19 1130  WBC 8.2  NEUTROABS 5.7  HGB 9.3*  HCT 29.5*  MCV 83.8  PLT 333   Basic Metabolic Panel: Recent Labs  Lab 05/05/19 1130  NA 129*  K 2.2*  CL 79*  CO2 34*  GLUCOSE 102*  BUN 27*  CREATININE 1.52*  CALCIUM 9.2   GFR: CrCl cannot be calculated (Unknown ideal weight.). Liver Function Tests: Recent Labs  Lab 05/05/19 1130  AST 29  ALT 24  ALKPHOS 96  BILITOT 0.9  PROT 7.4  ALBUMIN 3.9   No results for input(s): LIPASE, AMYLASE in the last 168 hours. No results for input(s): AMMONIA in the last 168 hours. Coagulation Profile: No results for input(s): INR, PROTIME in the last 168 hours. Cardiac Enzymes: No results for input(s): CKTOTAL, CKMB, CKMBINDEX, TROPONINI in the last 168 hours. BNP (last 3 results) No results for input(s): PROBNP in the last 8760 hours. HbA1C: No results for input(s): HGBA1C in the last 72 hours. CBG: No results for input(s): GLUCAP in the last 168 hours. Lipid Profile: No results for input(s): CHOL, HDL, LDLCALC, TRIG, CHOLHDL, LDLDIRECT in the last 72 hours. Thyroid Function Tests: No results for input(s): TSH, T4TOTAL, FREET4, T3FREE, THYROIDAB in the last 72 hours. Anemia Panel: No results  for input(s): VITAMINB12, FOLATE, FERRITIN, TIBC, IRON, RETICCTPCT in the last 72 hours. Urine analysis: No results found for: COLORURINE, APPEARANCEUR, [place], [place], [place],  HGBUR, BILIRUBINUR, KETONESUR, PROTEINUR, UROBILINOGEN, NITRITE, LEUKOCYTESUR  Radiological Exams on Admission: Dg Chest Portable 1 View  Result Date: 05/05/2019 CLINICAL DATA:  Productive cough and shortness-of-breath 2 days. EXAM: PORTABLE CHEST 1 VIEW COMPARISON:  04/07/2019 FINDINGS: Sternotomy wires unchanged. Lungs are adequately inflated with no focal lobar consolidation or effusion. There is mild hazy prominence of the central pulmonary vessels unchanged likely mild vascular congestion. Stable cardiomegaly. Remainder the exam is unchanged. IMPRESSION: Cardiomegaly with suggestion of mild stable vascular congestion. Electronically Signed   By: Marin Olp M.D.   On: 05/05/2019 12:16    EKG: Independently reviewed.  Atrial fibrillation 73 bpm with no other acute findings.  Assessment/Plan Active Problems:   Acute hypokalemia    Acute hypokalemia secondary to Lasix diuresis with medication noncompliance -Patient has not been taking her home potassium and we will replete while here with IV and p.o. was ordered -Recheck labs in a.m. along with magnesium -Continue home Lasix as prior and refill potassium on discharge  Possible viral bronchitis versus mild asthma flare -We will check procalcitonin, but no leukocytosis noted -Breathing treatments as needed -Antitussives  Chronic diastolic CHF -Does not appear to be in exacerbation -Monitor daily weights and I's and O's -Maintained on home Lasix and metolazone  Hypothyroidism -Continue Synthroid  CKD stage III-stable -Recheck a.m. labs  Prior aortic valve replacement -Maintained on warfarin with pharmacy to dose -Obtain INR  Atrial fibrillation-rate controlled -Maintain on diltiazem and warfarin -Maintained on bisoprolol -Telemetry monitoring   GERD -Maintain on PPI   DVT prophylaxis: Warfarin per pharmacy Code Status: Full Family Communication: None at bedside Disposition Plan: Admit for potassium correction Consults called: None Admission status: Observation, telemetry   Ledell Codrington Darleen Crocker DO [facility] Pager [phone]  If 7PM-7AM, please contact night-coverage [email or web address] Password [email or web address]  05/05/2019, 3:00 PM

## 2019-05-06 DIAGNOSIS — E876 Hypokalemia: Secondary | ICD-10-CM | POA: Diagnosis not present

## 2019-05-06 LAB — MAGNESIUM: Magnesium: 1.7 mg/dL (ref 1.7–2.4)

## 2019-05-06 LAB — HIV ANTIBODY (ROUTINE TESTING W REFLEX): HIV Screen 4th Generation wRfx: NONREACTIVE

## 2019-05-06 LAB — CBC
HCT: 27.1 % — ABNORMAL LOW (ref 36.0–46.0)
Hemoglobin: 8.8 g/dL — ABNORMAL LOW (ref 12.0–15.0)
MCH: 26.7 pg (ref 26.0–34.0)
MCHC: 32.5 g/dL (ref 30.0–36.0)
MCV: 82.4 fL (ref 80.0–100.0)
Platelets: 267 10*3/uL (ref 150–400)
RBC: 3.29 MIL/uL — ABNORMAL LOW (ref 3.87–5.11)
RDW: 16.3 % — ABNORMAL HIGH (ref 11.5–15.5)
WBC: 8.7 10*3/uL (ref 4.0–10.5)
nRBC: 0 % (ref 0.0–0.2)

## 2019-05-06 LAB — BASIC METABOLIC PANEL
Anion gap: 14 (ref 5–15)
BUN: 32 mg/dL — ABNORMAL HIGH (ref 8–23)
CO2: 29 mmol/L (ref 22–32)
Calcium: 8.3 mg/dL — ABNORMAL LOW (ref 8.9–10.3)
Chloride: 82 mmol/L — ABNORMAL LOW (ref 98–111)
Creatinine, Ser: 1.6 mg/dL — ABNORMAL HIGH (ref 0.44–1.00)
GFR calc Af Amer: 38 mL/min — ABNORMAL LOW (ref >60)
GFR calc non Af Amer: 33 mL/min — ABNORMAL LOW (ref >60)
Glucose, Bld: 126 mg/dL — ABNORMAL HIGH (ref 70–99)
Potassium: 3.2 mmol/L — ABNORMAL LOW (ref 3.5–5.1)
Sodium: 125 mmol/L — ABNORMAL LOW (ref 135–145)

## 2019-05-06 LAB — PROTIME-INR
INR: 3.1 — ABNORMAL HIGH (ref 0.8–1.2)
Prothrombin Time: 31.1 seconds — ABNORMAL HIGH (ref 11.4–15.2)

## 2019-05-06 MED ORDER — OXYCODONE HCL 5 MG PO TABS
5.0000 mg | ORAL_TABLET | Freq: Once | ORAL | Status: AC
  Administered 2019-05-06: 5 mg via ORAL
  Filled 2019-05-06: qty 1

## 2019-05-06 MED ORDER — POTASSIUM CHLORIDE CRYS ER 20 MEQ PO TBCR: 20.0000 meq | EXTENDED_RELEASE_TABLET | Freq: Three times a day (TID) | ORAL | 3 refills | Status: DC

## 2019-05-06 MED ORDER — DM-GUAIFENESIN ER 30-600 MG PO TB12: 1.0000 | ORAL_TABLET | Freq: Two times a day (BID) | ORAL | 0 refills | Status: DC

## 2019-05-06 MED ORDER — SODIUM CHLORIDE 0.9 % IV SOLN: INTRAVENOUS | Status: DC

## 2019-05-06 MED ORDER — SODIUM CHLORIDE 1 G PO TABS: 1.0000 g | ORAL_TABLET | Freq: Two times a day (BID) | ORAL | 0 refills | Status: AC

## 2019-05-06 MED ORDER — METOLAZONE 2.5 MG PO TABS: 2.5000 mg | ORAL_TABLET | ORAL | 1 refills | Status: DC

## 2019-05-06 NOTE — Progress Notes (Signed)
ANTICOAGULATION CONSULT NOTE - Initial Consult  Pharmacy Consult for warfarin Indication: Mechanical valve + afib  Patient Measurements: Height: 5\' 7"  (170.2 cm) Weight: 199 lb 1.2 oz (90.3 kg) IBW/kg (Calculated) : 61.6   Vital Signs: Temp: 97.8 F (36.6 C) (07/17 0501) Temp Source: Oral (07/17 0501) BP: 112/84 (07/17 0501) Pulse Rate: 56 (07/17 0501)  Labs: Recent Labs    05/05/19 1130 05/06/19 0454  HGB 9.3* 8.8*  HCT 29.5* 27.1*  PLT 308 267  LABPROT 28.8* 31.1*  INR 2.8* 3.1*  CREATININE 1.52* 1.60*    Estimated Creatinine Clearance: 39.4 mL/min (A) (by C-G formula based on SCr of 1.6 mg/dL (H)).   Medical   [facility]  Assessment: Pharmacy consulted to dose warfarin in patient with mechanical valve and atrial fibrillation.  Patient's listed home dose is 4 mg on Tuesday and 3 mg ROW per anticoagulation note. Apparently, patient did receive warfarin yesterday, as her INR today is 3.1.  Goal of Therapy:  INR 2-3 Monitor platelets by anticoagulation protocol: Yes   Plan:  Hold warfarin today for INR 3.1 Monitor CBC and INR  [name] 05/06/2019,10:13 AM

## 2019-05-06 NOTE — Discharge Summary (Signed)
Physician Discharge Summary  [name] EXB:[id number] DOB: [birth year] DOA: 05/05/2019  PCP: Octavio Graves, DO (Inactive)  Admit date: 05/05/2019  Discharge date: 05/06/2019  Admitted From:Home  Disposition:  Home  Recommendations for Outpatient Follow-up:  1. Follow up with PCP in 1-2 weeks 2. Continue on home medications as prior 3. Refilled home potassium to continue taking. 4. Discussed staying on low-sodium diet as previous, but will remain on salt tablets for 5 days with recommended BMP follow-up in 1 week to ensure potassium and sodium within normal limits.  Home Health: None  Equipment/Devices: None  Discharge Condition: Stable  CODE STATUS: Full  Diet recommendation: Heart Healthy  Brief/Interim Summary: Per HPI: [name] is a 68 y.o. female with medical history significant for paroxysmal atrial fibrillation, CAD status post CABG, CKD stage III, asthma, and aortic stenosis status post AVR 2007-[facility], and chronic hypoxemia on 2 L nasal cannula at home.  She presented to the emergency department with 2 days worth of some mild dyspnea as well as what sounds like productive cough particularly at night.  She denies any fevers or chills.  She denies any shortness of breath with exertion, chest pain, lower extremity edema, orthopnea, or paroxysmal nocturnal dyspnea.  She has had a recent 2D echocardiogram with LVEF 55-65% on recent admission with no other abnormalities noted.  She states that she has been taking her home medications with the exception of a few which would include her potassium.  She states that she has not taken her potassium over the last 2 to 3 weeks and does continue to take her Lasix at home.  Patient was primarily admitted for severe hypokalemia with potassium that has improved to 3.2 on repeat labs this morning.  She is stable for discharge and will continue her home potassium as previously prescribed.  She will also be given repeat dose of oral  potassium this morning prior to discharge.  She is in stable condition and continues to have a very mild cough, but work-up here does not demonstrate any bacterial infection or pneumonia.  I suspect this may be related to mild CHF versus asthma versus a viral bronchitis.  I will give her Mucinex as needed for symptoms.  Her potassium has been refilled and will need follow-up on repeat BMP in the outpatient setting.  Additionally, she is noted to be hyponatremic, but asymptomatic.  Sodium level currently is 125 and she states that she strictly avoids all salt intake at home and I have discussed with her that she may have some minimal amounts of salt and will give her some salt tablets for the next 5 days and will need close follow-up with her BMP regarding this.  She is to continue her Lasix as prior.  No other acute events noted during the course of this admission.  She is stable for discharge.  Discharge Diagnoses:  Active Problems:   Acute hypokalemia  Principal discharge diagnosis: Mildly symptomatic hypokalemia secondary to medication noncompliance.  Discharge Instructions  Discharge Instructions    Diet - low sodium heart healthy   Complete by: As directed    Increase activity slowly   Complete by: As directed      Allergies as of 05/06/2019      Reactions   Penicillins Hives, Itching, Swelling, Rash   Has patient had a PCN reaction causing immediate rash, facial/tongue/throat swelling, SOB or lightheadedness with hypotension: Yes Has patient had a PCN reaction causing severe rash involving mucus membranes or skin  necrosis: Yes Has patient had a PCN reaction that required hospitalization: unknown Has patient had a PCN reaction occurring within the last 10 years: No If all of the above answers are "NO", then may proceed with Cephalosporin use. Throat swelling      Medication List    TAKE these medications   allopurinol 100 MG tablet Commonly known as: ZYLOPRIM Take 100 mg by mouth  daily.   ALPRAZolam 0.5 MG tablet Commonly known as: XANAX Take 0.5 mg by mouth 2 (two) times a day.   bisoprolol 10 MG tablet Commonly known as: ZEBETA Take 10 mg by mouth daily.   budesonide-formoterol 160-4.5 MCG/ACT inhaler Commonly known as: Symbicort Inhale 2 puffs into the lungs 2 (two) times daily.   cetirizine 10 MG tablet Commonly known as: ZYRTEC Take 10 mg by mouth daily.   Co Q 10 100 MG Caps Take 1 capsule by mouth daily.   dextromethorphan-guaiFENesin 30-600 MG 12hr tablet Commonly known as: MUCINEX DM Take 1 tablet by mouth 2 (two) times daily.   diltiazem 120 MG 24 hr capsule Commonly known as: CARDIZEM CD Take 1 capsule (120 mg total) by mouth daily.   furosemide 80 MG tablet Commonly known as: LASIX Take 80-120 mg by mouth See admin instructions. Takes 120 mg in the am, 80 mg in the pm   Ipratropium-Albuterol 20-100 MCG/ACT Aers respimat Commonly known as: COMBIVENT Inhale 1-2 puffs into the lungs every 6 (six) hours as needed for wheezing or shortness of breath.   levothyroxine 50 MCG tablet Commonly known as: SYNTHROID Take 50 mcg by mouth daily before breakfast.   magnesium oxide 400 MG tablet Commonly known as: MAG-OX Take 400 mg by mouth daily.   metolazone 2.5 MG tablet Commonly known as: ZAROXOLYN Take 1 tablet (2.5 mg total) by mouth as directed. May take 30 minutes before Lasix if you gain more than 3 lbs in 24 hours   multivitamin with minerals Tabs tablet Take 1 tablet by mouth daily.   omeprazole 20 MG capsule Commonly known as: PRILOSEC Take 20 mg by mouth daily.   OXYGEN Inhale 2 L into the lungs daily.   potassium chloride SA 20 MEQ tablet Commonly known as: K-DUR Take 1 tablet (20 mEq total) by mouth 3 (three) times daily.   rOPINIRole 3 MG tablet Commonly known as: REQUIP Take 3 mg by mouth daily.   sodium chloride 1 g tablet Take 1 tablet (1 g total) by mouth 2 (two) times daily with a meal for 5 days.    warfarin 2 MG tablet Commonly known as: COUMADIN Take as directed. If you are unsure how to take this medication, talk to your nurse or doctor. Original instructions: Take 1.5-2 tablets (3-4 mg total) by mouth See admin instructions. 4mg  on Tuesday and 3 mg daily the rest of the week.      Follow-up Information    Octavio Graves, DO Follow up in 1 week(s).   Contact information: [address] [place] [address] [phone]        Satira Sark, MD .   Specialty: Cardiology Contact information: [place] [address] [phone]          Allergies  Allergen Reactions  . Penicillins Hives, Itching, Swelling and Rash    Has patient had a PCN reaction causing immediate rash, facial/tongue/throat swelling, SOB or lightheadedness with hypotension: Yes Has patient had a PCN reaction causing severe rash involving mucus membranes or skin necrosis: Yes  Has patient had a PCN reaction that required hospitalization: unknown Has patient had a PCN reaction occurring within the last 10 years: No If all of the above answers are "NO", then may proceed with Cephalosporin use.    Throat swelling    Consultations:  None   Procedures/Studies: Dg Chest Portable 1 View  Result Date: 05/05/2019 CLINICAL DATA:  Productive cough and shortness-of-breath 2 days. EXAM: PORTABLE CHEST 1 VIEW COMPARISON:  04/07/2019 FINDINGS: Sternotomy wires unchanged. Lungs are adequately inflated with no focal lobar consolidation or effusion. There is mild hazy prominence of the central pulmonary vessels unchanged likely mild vascular congestion. Stable cardiomegaly. Remainder the exam is unchanged. IMPRESSION: Cardiomegaly with suggestion of mild stable vascular congestion. Electronically Signed   By: Marin Olp M.D.   On: 05/05/2019 12:16   Dg Chest Port 1 View  Result Date: 04/07/2019 CLINICAL DATA:  Shortness of breath, cough EXAM: PORTABLE CHEST 1 VIEW COMPARISON:   12/28/2018 FINDINGS: Cardiomegaly. Prior median sternotomy and valve replacement. Mild pulmonary vascular congestion and interstitial prominence could reflect interstitial edema. No effusions or acute bony abnormality. IMPRESSION: Cardiomegaly with vascular congestion and interstitial prominence, question interstitial edema. Electronically Signed   By: Rolm Baptise M.D.   On: 04/07/2019 19:26   Discharge Exam: Vitals:   05/06/19 0501 05/06/19 0858  BP: 112/84   Pulse: (!) 56   Resp: 17   Temp: 97.8 F (36.6 C)   SpO2: 98% 97%   Vitals:   05/05/19 2110 05/06/19 0458 05/06/19 0501 05/06/19 0858  BP: 108/69  112/84   Pulse: 62  (!) 56   Resp: 18  17   Temp: 97.9 F (36.6 C)  97.8 F (36.6 C)   TempSrc: Oral  Oral   SpO2: 95%  98% 97%  Weight:  90.3 kg    Height:        General: Pt is alert, awake, not in acute distress Cardiovascular: RRR, S1/S2 +, no rubs, no gallops Respiratory: CTA bilaterally, no wheezing, no rhonchi, on 2 L nasal cannula Abdominal: Soft, NT, ND, bowel sounds + Extremities: no edema, no cyanosis    The results of significant diagnostics from this hospitalization (including imaging, microbiology, ancillary and laboratory) are listed below for reference.     Microbiology: Recent Results (from the past 240 hour(s))  SARS Coronavirus 2 (CEPHEID- Performed in [place] hospital lab), Hosp Order     Status: None   Collection Time: 05/05/19 12:51 PM   Specimen: Nasopharyngeal Swab  Result Value Ref Range Status   SARS Coronavirus 2 NEGATIVE NEGATIVE Final    Comment: (NOTE) If result is NEGATIVE SARS-CoV-2 target nucleic acids are NOT DETECTED. The SARS-CoV-2 RNA is generally detectable in upper and lower  respiratory specimens during the acute phase of infection. The lowest  concentration of SARS-CoV-2 viral copies this assay can detect is 250  copies / mL. A negative result does not preclude SARS-CoV-2 infection  and should not be used as the sole  basis for treatment or other  patient management decisions.  A negative result may occur with  improper specimen collection / handling, submission of specimen other  than nasopharyngeal swab, presence of viral mutation(s) within the  areas targeted by this assay, and inadequate number of viral copies  (<250 copies / mL). A negative result must be combined with clinical  observations, patient history, and epidemiological information. If result is POSITIVE SARS-CoV-2 target nucleic acids are DETECTED. The SARS-CoV-2 RNA is generally detectable in upper and lower  respiratory specimens dur ing the acute phase of infection.  Positive  results are indicative of active infection with SARS-CoV-2.  Clinical  correlation with patient history and other diagnostic information is  necessary to determine patient infection status.  Positive results do  not rule out bacterial infection or co-infection with other viruses. If result is PRESUMPTIVE POSTIVE SARS-CoV-2 nucleic acids MAY BE PRESENT.   A presumptive positive result was obtained on the submitted specimen  and confirmed on repeat testing.  While 2019 novel coronavirus  (SARS-CoV-2) nucleic acids may be present in the submitted sample  additional confirmatory testing may be necessary for epidemiological  and / or clinical management purposes  to differentiate between  SARS-CoV-2 and other Sarbecovirus currently known to infect humans.  If clinically indicated additional testing with an alternate test  methodology [phone]) is advised. The SARS-CoV-2 RNA is generally  detectable in upper and lower respiratory sp ecimens during the acute  phase of infection. The expected result is Negative. Fact Sheet for Patients:  StrictlyIdeas.no Fact Sheet for Healthcare Providers: BankingDealers.co.za This test is not yet approved or cleared by the [place] FDA and has been authorized for detection  and/or diagnosis of SARS-CoV-2 by FDA under an Emergency Use Authorization (EUA).  This EUA will remain in effect (meaning this test can be used) for the duration of the COVID-19 declaration under Section 564(b)(1) of the Act, 21 U.S.C. section 360bbb-3(b)(1), unless the authorization is terminated or revoked sooner. Performed at [facility], [address], [place], [place] [address]      Labs: BNP (last 3 results) Recent Labs    09/01/18 1722 12/28/18 1123 04/07/19 1819  BNP 958.0* 652.0* 694.8*   Basic Metabolic Panel: Recent Labs  Lab 05/05/19 1130 05/06/19 0454  NA 129* 125*  K 2.2* 3.2*  CL 79* 82*  CO2 34* 29  GLUCOSE 102* 126*  BUN 27* 32*  CREATININE 1.52* 1.60*  CALCIUM 9.2 8.3*  MG  --  1.7   Liver Function Tests: Recent Labs  Lab 05/05/19 1130  AST 29  ALT 24  ALKPHOS 96  BILITOT 0.9  PROT 7.4  ALBUMIN 3.9   No results for input(s): LIPASE, AMYLASE in the last 168 hours. No results for input(s): AMMONIA in the last 168 hours. CBC: Recent Labs  Lab 05/05/19 1130 05/06/19 0454  WBC 8.2 8.7  NEUTROABS 5.7  --   HGB 9.3* 8.8*  HCT 29.5* 27.1*  MCV 83.8 82.4  PLT 308 267   Cardiac Enzymes: No results for input(s): CKTOTAL, CKMB, CKMBINDEX, TROPONINI in the last 168 hours. BNP: Invalid input(s): POCBNP CBG: No results for input(s): GLUCAP in the last 168 hours. D-Dimer No results for input(s): DDIMER in the last 72 hours. Hgb A1c No results for input(s): HGBA1C in the last 72 hours. Lipid Profile No results for input(s): CHOL, HDL, LDLCALC, TRIG, CHOLHDL, LDLDIRECT in the last 72 hours. Thyroid function studies No results for input(s): TSH, T4TOTAL, T3FREE, THYROIDAB in the last 72 hours.  Invalid input(s): FREET3 Anemia work up No results for input(s): VITAMINB12, FOLATE, FERRITIN, TIBC, IRON, RETICCTPCT in the last 72 hours. Urinalysis No results found for: COLORURINE, APPEARANCEUR, [place], [place], GLUCOSEU, [place], [place],  KETONESUR, PROTEINUR, UROBILINOGEN, NITRITE, LEUKOCYTESUR Sepsis Labs Invalid input(s): PROCALCITONIN,  WBC,  LACTICIDVEN Microbiology Recent Results (from the past 240 hour(s))  SARS Coronavirus 2 (CEPHEID- Performed in [place] hospital lab), Hosp Order     Status: None   Collection Time: 05/05/19 12:51 PM  Specimen: Nasopharyngeal Swab  Result Value Ref Range Status   SARS Coronavirus 2 NEGATIVE NEGATIVE Final    Comment: (NOTE) If result is NEGATIVE SARS-CoV-2 target nucleic acids are NOT DETECTED. The SARS-CoV-2 RNA is generally detectable in upper and lower  respiratory specimens during the acute phase of infection. The lowest  concentration of SARS-CoV-2 viral copies this assay can detect is 250  copies / mL. A negative result does not preclude SARS-CoV-2 infection  and should not be used as the sole basis for treatment or other  patient management decisions.  A negative result may occur with  improper specimen collection / handling, submission of specimen other  than nasopharyngeal swab, presence of viral mutation(s) within the  areas targeted by this assay, and inadequate number of viral copies  (<250 copies / mL). A negative result must be combined with clinical  observations, patient history, and epidemiological information. If result is POSITIVE SARS-CoV-2 target nucleic acids are DETECTED. The SARS-CoV-2 RNA is generally detectable in upper and lower  respiratory specimens dur ing the acute phase of infection.  Positive  results are indicative of active infection with SARS-CoV-2.  Clinical  correlation with patient history and other diagnostic information is  necessary to determine patient infection status.  Positive results do  not rule out bacterial infection or co-infection with other viruses. If result is PRESUMPTIVE POSTIVE SARS-CoV-2 nucleic acids MAY BE PRESENT.   A presumptive positive result was obtained on the submitted specimen  and confirmed on repeat  testing.  While 2019 novel coronavirus  (SARS-CoV-2) nucleic acids may be present in the submitted sample  additional confirmatory testing may be necessary for epidemiological  and / or clinical management purposes  to differentiate between  SARS-CoV-2 and other Sarbecovirus currently known to infect humans.  If clinically indicated additional testing with an alternate test  methodology [phone]) is advised. The SARS-CoV-2 RNA is generally  detectable in upper and lower respiratory sp ecimens during the acute  phase of infection. The expected result is Negative. Fact Sheet for Patients:  StrictlyIdeas.no Fact Sheet for Healthcare Providers: BankingDealers.co.za This test is not yet approved or cleared by the [place] FDA and has been authorized for detection and/or diagnosis of SARS-CoV-2 by FDA under an Emergency Use Authorization (EUA).  This EUA will remain in effect (meaning this test can be used) for the duration of the COVID-19 declaration under Section 564(b)(1) of the Act, 21 U.S.C. section 360bbb-3(b)(1), unless the authorization is terminated or revoked sooner. Performed at [facility], [address], [place], [place] [address]      Time coordinating discharge: 45 minutes  SIGNED:   Rodena Goldmann, DO [facility] 05/06/2019, 10:57 AM  If 7PM-7AM, please contact night-coverage [email or web address] Password [email or web address]

## 2019-05-06 NOTE — Plan of Care (Signed)

## 2019-05-10 DIAGNOSIS — I5033 Acute on chronic diastolic (congestive) heart failure: Secondary | ICD-10-CM | POA: Diagnosis not present

## 2019-05-10 DIAGNOSIS — I482 Chronic atrial fibrillation, unspecified: Secondary | ICD-10-CM | POA: Diagnosis not present

## 2019-05-10 DIAGNOSIS — N183 Chronic kidney disease, stage 3 (moderate): Secondary | ICD-10-CM | POA: Diagnosis not present

## 2019-05-10 DIAGNOSIS — I350 Nonrheumatic aortic (valve) stenosis: Secondary | ICD-10-CM | POA: Diagnosis not present

## 2019-05-10 DIAGNOSIS — J45901 Unspecified asthma with (acute) exacerbation: Secondary | ICD-10-CM | POA: Diagnosis not present

## 2019-05-10 DIAGNOSIS — M549 Dorsalgia, unspecified: Secondary | ICD-10-CM | POA: Diagnosis not present

## 2019-05-10 DIAGNOSIS — J9621 Acute and chronic respiratory failure with hypoxia: Secondary | ICD-10-CM | POA: Diagnosis not present

## 2019-05-10 DIAGNOSIS — G8929 Other chronic pain: Secondary | ICD-10-CM | POA: Diagnosis not present

## 2019-05-10 DIAGNOSIS — D649 Anemia, unspecified: Secondary | ICD-10-CM | POA: Diagnosis not present

## 2019-05-10 DIAGNOSIS — I2510 Atherosclerotic heart disease of native coronary artery without angina pectoris: Secondary | ICD-10-CM | POA: Diagnosis not present

## 2019-05-12 ENCOUNTER — Ambulatory Visit (INDEPENDENT_AMBULATORY_CARE_PROVIDER_SITE_OTHER): Payer: Medicare HMO | Admitting: *Deleted

## 2019-05-12 ENCOUNTER — Other Ambulatory Visit: Payer: Self-pay

## 2019-05-12 DIAGNOSIS — Z952 Presence of prosthetic heart valve: Secondary | ICD-10-CM | POA: Diagnosis not present

## 2019-05-12 DIAGNOSIS — Z5181 Encounter for therapeutic drug level monitoring: Secondary | ICD-10-CM

## 2019-05-12 DIAGNOSIS — I4891 Unspecified atrial fibrillation: Secondary | ICD-10-CM | POA: Diagnosis not present

## 2019-05-12 LAB — POCT INR: INR: 2.6 (ref 2.0–3.0)

## 2019-05-12 NOTE — Patient Instructions (Signed)
Continue coumadin 1 1/2 tablet daily except 2 tablets on Tuesdays and Fridays Recheck in 4 weeks.

## 2019-05-13 DIAGNOSIS — M549 Dorsalgia, unspecified: Secondary | ICD-10-CM | POA: Diagnosis not present

## 2019-05-13 DIAGNOSIS — G8929 Other chronic pain: Secondary | ICD-10-CM | POA: Diagnosis not present

## 2019-05-13 DIAGNOSIS — I350 Nonrheumatic aortic (valve) stenosis: Secondary | ICD-10-CM | POA: Diagnosis not present

## 2019-05-13 DIAGNOSIS — D649 Anemia, unspecified: Secondary | ICD-10-CM | POA: Diagnosis not present

## 2019-05-13 DIAGNOSIS — N183 Chronic kidney disease, stage 3 (moderate): Secondary | ICD-10-CM | POA: Diagnosis not present

## 2019-05-13 DIAGNOSIS — J9621 Acute and chronic respiratory failure with hypoxia: Secondary | ICD-10-CM | POA: Diagnosis not present

## 2019-05-13 DIAGNOSIS — I5033 Acute on chronic diastolic (congestive) heart failure: Secondary | ICD-10-CM | POA: Diagnosis not present

## 2019-05-13 DIAGNOSIS — I482 Chronic atrial fibrillation, unspecified: Secondary | ICD-10-CM | POA: Diagnosis not present

## 2019-05-13 DIAGNOSIS — J45901 Unspecified asthma with (acute) exacerbation: Secondary | ICD-10-CM | POA: Diagnosis not present

## 2019-05-13 DIAGNOSIS — I2510 Atherosclerotic heart disease of native coronary artery without angina pectoris: Secondary | ICD-10-CM | POA: Diagnosis not present

## 2019-05-16 DIAGNOSIS — J9621 Acute and chronic respiratory failure with hypoxia: Secondary | ICD-10-CM | POA: Diagnosis not present

## 2019-05-16 DIAGNOSIS — I350 Nonrheumatic aortic (valve) stenosis: Secondary | ICD-10-CM | POA: Diagnosis not present

## 2019-05-16 DIAGNOSIS — J45901 Unspecified asthma with (acute) exacerbation: Secondary | ICD-10-CM | POA: Diagnosis not present

## 2019-05-16 DIAGNOSIS — I5033 Acute on chronic diastolic (congestive) heart failure: Secondary | ICD-10-CM | POA: Diagnosis not present

## 2019-05-16 DIAGNOSIS — M549 Dorsalgia, unspecified: Secondary | ICD-10-CM | POA: Diagnosis not present

## 2019-05-16 DIAGNOSIS — I2510 Atherosclerotic heart disease of native coronary artery without angina pectoris: Secondary | ICD-10-CM | POA: Diagnosis not present

## 2019-05-16 DIAGNOSIS — I482 Chronic atrial fibrillation, unspecified: Secondary | ICD-10-CM | POA: Diagnosis not present

## 2019-05-16 DIAGNOSIS — N183 Chronic kidney disease, stage 3 (moderate): Secondary | ICD-10-CM | POA: Diagnosis not present

## 2019-05-16 DIAGNOSIS — G8929 Other chronic pain: Secondary | ICD-10-CM | POA: Diagnosis not present

## 2019-05-16 DIAGNOSIS — D649 Anemia, unspecified: Secondary | ICD-10-CM | POA: Diagnosis not present

## 2019-05-17 DIAGNOSIS — E039 Hypothyroidism, unspecified: Secondary | ICD-10-CM | POA: Diagnosis not present

## 2019-05-17 DIAGNOSIS — I5042 Chronic combined systolic (congestive) and diastolic (congestive) heart failure: Secondary | ICD-10-CM | POA: Diagnosis not present

## 2019-05-17 DIAGNOSIS — E8881 Metabolic syndrome: Secondary | ICD-10-CM | POA: Diagnosis not present

## 2019-05-17 DIAGNOSIS — E559 Vitamin D deficiency, unspecified: Secondary | ICD-10-CM | POA: Diagnosis not present

## 2019-05-17 DIAGNOSIS — R69 Illness, unspecified: Secondary | ICD-10-CM | POA: Diagnosis not present

## 2019-05-17 DIAGNOSIS — E782 Mixed hyperlipidemia: Secondary | ICD-10-CM | POA: Diagnosis not present

## 2019-05-17 DIAGNOSIS — K219 Gastro-esophageal reflux disease without esophagitis: Secondary | ICD-10-CM | POA: Diagnosis not present

## 2019-05-17 DIAGNOSIS — I10 Essential (primary) hypertension: Secondary | ICD-10-CM | POA: Diagnosis not present

## 2019-05-20 DIAGNOSIS — I5033 Acute on chronic diastolic (congestive) heart failure: Secondary | ICD-10-CM | POA: Diagnosis not present

## 2019-05-20 DIAGNOSIS — J441 Chronic obstructive pulmonary disease with (acute) exacerbation: Secondary | ICD-10-CM | POA: Diagnosis not present

## 2019-05-20 DIAGNOSIS — Z791 Long term (current) use of non-steroidal anti-inflammatories (NSAID): Secondary | ICD-10-CM | POA: Diagnosis not present

## 2019-05-25 DIAGNOSIS — G8929 Other chronic pain: Secondary | ICD-10-CM | POA: Diagnosis not present

## 2019-05-25 DIAGNOSIS — I482 Chronic atrial fibrillation, unspecified: Secondary | ICD-10-CM | POA: Diagnosis not present

## 2019-05-25 DIAGNOSIS — D649 Anemia, unspecified: Secondary | ICD-10-CM | POA: Diagnosis not present

## 2019-05-25 DIAGNOSIS — J9621 Acute and chronic respiratory failure with hypoxia: Secondary | ICD-10-CM | POA: Diagnosis not present

## 2019-05-25 DIAGNOSIS — I5033 Acute on chronic diastolic (congestive) heart failure: Secondary | ICD-10-CM | POA: Diagnosis not present

## 2019-05-25 DIAGNOSIS — J45901 Unspecified asthma with (acute) exacerbation: Secondary | ICD-10-CM | POA: Diagnosis not present

## 2019-05-25 DIAGNOSIS — N183 Chronic kidney disease, stage 3 (moderate): Secondary | ICD-10-CM | POA: Diagnosis not present

## 2019-05-25 DIAGNOSIS — I350 Nonrheumatic aortic (valve) stenosis: Secondary | ICD-10-CM | POA: Diagnosis not present

## 2019-05-25 DIAGNOSIS — I2510 Atherosclerotic heart disease of native coronary artery without angina pectoris: Secondary | ICD-10-CM | POA: Diagnosis not present

## 2019-05-25 DIAGNOSIS — M549 Dorsalgia, unspecified: Secondary | ICD-10-CM | POA: Diagnosis not present

## 2019-05-28 ENCOUNTER — Observation Stay (HOSPITAL_COMMUNITY)
Admission: EM | Admit: 2019-05-28 | Discharge: 2019-05-30 | Disposition: A | Discharge status: Home / Self Care | Payer: Medicare HMO | Attending: Internal Medicine | Admitting: Internal Medicine

## 2019-05-28 ENCOUNTER — Emergency Department (HOSPITAL_COMMUNITY): Payer: Medicare HMO

## 2019-05-28 ENCOUNTER — Encounter (HOSPITAL_COMMUNITY): Payer: Self-pay

## 2019-05-28 ENCOUNTER — Other Ambulatory Visit: Payer: Self-pay

## 2019-05-28 DIAGNOSIS — I509 Heart failure, unspecified: Secondary | ICD-10-CM

## 2019-05-28 DIAGNOSIS — E039 Hypothyroidism, unspecified: Secondary | ICD-10-CM | POA: Diagnosis present

## 2019-05-28 DIAGNOSIS — L299 Pruritus, unspecified: Secondary | ICD-10-CM | POA: Diagnosis present

## 2019-05-28 DIAGNOSIS — I2510 Atherosclerotic heart disease of native coronary artery without angina pectoris: Secondary | ICD-10-CM | POA: Diagnosis not present

## 2019-05-28 DIAGNOSIS — Z87891 Personal history of nicotine dependence: Secondary | ICD-10-CM | POA: Diagnosis not present

## 2019-05-28 DIAGNOSIS — R0602 Shortness of breath: Secondary | ICD-10-CM | POA: Diagnosis not present

## 2019-05-28 DIAGNOSIS — I482 Chronic atrial fibrillation, unspecified: Secondary | ICD-10-CM | POA: Diagnosis present

## 2019-05-28 DIAGNOSIS — N183 Chronic kidney disease, stage 3 unspecified: Secondary | ICD-10-CM | POA: Diagnosis present

## 2019-05-28 DIAGNOSIS — I4891 Unspecified atrial fibrillation: Secondary | ICD-10-CM | POA: Insufficient documentation

## 2019-05-28 DIAGNOSIS — Z79899 Other long term (current) drug therapy: Secondary | ICD-10-CM | POA: Diagnosis not present

## 2019-05-28 DIAGNOSIS — J449 Chronic obstructive pulmonary disease, unspecified: Secondary | ICD-10-CM | POA: Diagnosis not present

## 2019-05-28 DIAGNOSIS — E876 Hypokalemia: Principal | ICD-10-CM | POA: Diagnosis present

## 2019-05-28 DIAGNOSIS — I130 Hypertensive heart and chronic kidney disease with heart failure and stage 1 through stage 4 chronic kidney disease, or unspecified chronic kidney disease: Secondary | ICD-10-CM | POA: Diagnosis not present

## 2019-05-28 DIAGNOSIS — Z20828 Contact with and (suspected) exposure to other viral communicable diseases: Secondary | ICD-10-CM | POA: Insufficient documentation

## 2019-05-28 DIAGNOSIS — I5031 Acute diastolic (congestive) heart failure: Secondary | ICD-10-CM | POA: Diagnosis not present

## 2019-05-28 DIAGNOSIS — J45909 Unspecified asthma, uncomplicated: Secondary | ICD-10-CM | POA: Insufficient documentation

## 2019-05-28 LAB — BASIC METABOLIC PANEL
Anion gap: 12 (ref 5–15)
BUN: 27 mg/dL — ABNORMAL HIGH (ref 8–23)
CO2: 32 mmol/L (ref 22–32)
Calcium: 8.9 mg/dL (ref 8.9–10.3)
Chloride: 96 mmol/L — ABNORMAL LOW (ref 98–111)
Creatinine, Ser: 1.49 mg/dL — ABNORMAL HIGH (ref 0.44–1.00)
GFR calc Af Amer: 42 mL/min — ABNORMAL LOW (ref >60)
GFR calc non Af Amer: 36 mL/min — ABNORMAL LOW (ref >60)
Glucose, Bld: 102 mg/dL — ABNORMAL HIGH (ref 70–99)
Potassium: 2.6 mmol/L — CL (ref 3.5–5.1)
Sodium: 140 mmol/L (ref 135–145)

## 2019-05-28 LAB — CBC WITH DIFFERENTIAL/PLATELET
Abs Immature Granulocytes: 0.05 10*3/uL (ref 0.00–0.07)
Basophils Absolute: 0 10*3/uL (ref 0.0–0.1)
Basophils Relative: 0 %
Eosinophils Absolute: 0.1 10*3/uL (ref 0.0–0.5)
Eosinophils Relative: 1 %
HCT: 28.2 % — ABNORMAL LOW (ref 36.0–46.0)
Hemoglobin: 8.3 g/dL — ABNORMAL LOW (ref 12.0–15.0)
Immature Granulocytes: 1 %
Lymphocytes Relative: 15 %
Lymphs Abs: 1.2 10*3/uL (ref 0.7–4.0)
MCH: 25.2 pg — ABNORMAL LOW (ref 26.0–34.0)
MCHC: 29.4 g/dL — ABNORMAL LOW (ref 30.0–36.0)
MCV: 85.5 fL (ref 80.0–100.0)
Monocytes Absolute: 0.9 10*3/uL (ref 0.1–1.0)
Monocytes Relative: 10 %
Neutro Abs: 6.2 10*3/uL (ref 1.7–7.7)
Neutrophils Relative %: 73 %
Platelets: 309 10*3/uL (ref 150–400)
RBC: 3.3 MIL/uL — ABNORMAL LOW (ref 3.87–5.11)
RDW: 16.7 % — ABNORMAL HIGH (ref 11.5–15.5)
WBC: 8.4 10*3/uL (ref 4.0–10.5)
nRBC: 0 % (ref 0.0–0.2)

## 2019-05-28 LAB — MAGNESIUM: Magnesium: 1.8 mg/dL (ref 1.7–2.4)

## 2019-05-28 LAB — PROTIME-INR
INR: 2.8 — ABNORMAL HIGH (ref 0.8–1.2)
Prothrombin Time: 29.2 seconds — ABNORMAL HIGH (ref 11.4–15.2)

## 2019-05-28 LAB — BRAIN NATRIURETIC PEPTIDE: B Natriuretic Peptide: 653 pg/mL — ABNORMAL HIGH (ref 0.0–100.0)

## 2019-05-28 MED ORDER — METHYLPREDNISOLONE SODIUM SUCC 125 MG IJ SOLR
125.0000 mg | Freq: Once | INTRAMUSCULAR | Status: AC
  Administered 2019-05-28: 125 mg via INTRAVENOUS
  Filled 2019-05-28: qty 2

## 2019-05-28 MED ORDER — FAMOTIDINE IN NACL 20-0.9 MG/50ML-% IV SOLN
20.0000 mg | Freq: Once | INTRAVENOUS | Status: AC
  Administered 2019-05-28: 20 mg via INTRAVENOUS
  Filled 2019-05-28: qty 50

## 2019-05-28 MED ORDER — POTASSIUM CHLORIDE CRYS ER 20 MEQ PO TBCR
40.0000 meq | EXTENDED_RELEASE_TABLET | Freq: Once | ORAL | Status: AC
  Administered 2019-05-28: 40 meq via ORAL
  Filled 2019-05-28: qty 2

## 2019-05-28 MED ORDER — DIPHENHYDRAMINE HCL 50 MG/ML IJ SOLN
25.0000 mg | Freq: Once | INTRAMUSCULAR | Status: AC
  Administered 2019-05-28: 25 mg via INTRAVENOUS
  Filled 2019-05-28: qty 1

## 2019-05-28 MED ORDER — LORAZEPAM 2 MG/ML IJ SOLN
1.0000 mg | Freq: Once | INTRAMUSCULAR | Status: AC
  Administered 2019-05-28: 1 mg via INTRAVENOUS
  Filled 2019-05-28: qty 1

## 2019-05-28 NOTE — ED Triage Notes (Addendum)
Pt presents to ED with complaints of cough x 3 weeks. Pt states she has hx of CHF. Pt states she is chronically SOB. Pt SOB in triage with sats 88-93% Pt states she has also been itching all over for 2 weeks.

## 2019-05-28 NOTE — ED Notes (Signed)
Date and time results received: 05/28/19 23:25 (use smartphrase ".now" to insert current time)  Test: potassium 2.6 Critical Value:   Name of Provider Notified: Dr Betsey Holiday Orders Received? Or Actions Taken?: see mar

## 2019-05-28 NOTE — ED Provider Notes (Addendum)
[facility] EMERGENCY DEPARTMENT Provider Note   CSN: [id number] Arrival date & time: 05/28/19  2034    History   Chief Complaint No chief complaint on file.   HPI [name] is a 68 y.o. female.     Chief complaint pruritus for 2 weeks.  Symptoms are predominantly in her upper extremities and chest.  No known allergens or exposures.  Known history of COPD on chronic oxygen therapy at home.  Intermittent coughing for 3 weeks.  No fever, chills, rusty sputum.  She tried Benadryl and Xanax with minimal relief.  No crushing substernal chest pain, no dyspnea, no diaphoresis, nausea, known COVID exposures.  Severity is moderate.  Nothing makes symptoms better or worse.     Past Medical History:  Diagnosis Date  . Acute on chronic diastolic (congestive) heart failure ([place])   . Anxiety disorder   . Aortic stenosis    Status post St. Jude mechanical AVR 2007  . Asthma   . Atrial fibrillation ([place])   . Carcinoid tumor of colon 2007  . Coronary atherosclerosis of native coronary artery    Status post CABG 2007  . GERD (gastroesophageal reflux disease)   . History of colonoscopy 2003   Dr. Gala Romney - normal  . Hyperlipidemia   . Macromastia   . Pedal edema    Bilateral, chronic  . Peptic stricture of esophagus 10/04/2010   GE junction on last EGD by Dr. Gala Romney, benign biopsies  . RLS (restless legs syndrome)   . Schatzki's ring     Patient Active Problem List   Diagnosis Date Noted  . Acute hypokalemia 05/05/2019  . Acute CHF (congestive heart failure) ([place]) 04/08/2019  . Acute CHF ([place]) 04/07/2019  . Asthma, chronic, unspecified asthma severity, with acute exacerbation 04/07/2019  . Benign essential HTN   . CHF (congestive heart failure) ([place]) 07/26/2018  . Transaminitis 07/23/2018  . Acute on chronic respiratory failure with hypoxia and hypercapnia ([place]) 06/19/2018  . Acute respiratory failure with hypoxia ([place])   . Supratherapeutic INR 05/03/2018  . CHF exacerbation  ([place]) 05/02/2018  . Acute metabolic encephalopathy 35/45/6256  . CKD (chronic kidney disease) stage 3, GFR 30-59 ml/min (HCC) 05/02/2018  . Hypothyroidism 05/02/2018  . Generalized weakness 05/02/2018  . Physical deconditioning   . Coronary artery disease of bypass graft of native heart with stable angina pectoris ([place])   . Renal failure (ARF), acute on chronic (HCC)   . Acute on chronic respiratory failure with hypercapnia ([place]) 03/24/2018  . Hypomagnesemia 03/24/2018  . Hypokalemia 03/24/2018  . Hypophosphatemia 03/24/2018  . Atrial fibrillation with RVR ([place]) 03/24/2018  . Elevated troponin 03/24/2018  . Coughing 01/14/2018  . Anemia 01/14/2018  . Hx of adenomatous colonic polyps 01/14/2018  . Acute on chronic diastolic congestive heart failure ([place]) 12/17/2016  . COPD with acute exacerbation ([place]) 12/17/2016  . Bilateral leg edema 07/05/2014  . Encounter for therapeutic drug monitoring 11/15/2013  . Chronic venous insufficiency 08/26/2011  . Chronic anticoagulation 01/11/2011  . Peptic stricture of esophagus 10/04/2010  . GERD 09/16/2010  . DYSPHAGIA 09/16/2010  . H/O heart valve replacement with mechanical valve 06/05/2009  . Hyperlipidemia 06/04/2009  . Coronary atherosclerosis of native coronary artery 06/04/2009  . Atrial fibrillation, chronic 06/04/2009    Past Surgical History:  Procedure Laterality Date  . ABDOMINAL HYSTERECTOMY    . AORTIC VALVE REPLACEMENT  2007   #25 mm St. 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 APPENDECTOMY  2007  . BACK SURGERY     lumbar 4 and 5   . BIOPSY  02/05/2012   RMR:Two tongues of salmon-colored epithelium distal esophagus, very short-segment Barrett's s/p bx/Small hiatal hernia, otherwise normal stomach, D1, D2. Status post esophageal dilation. Biopsy showed GERD.  . Breast cyst removed     bilateral  . BREAST REDUCTION SURGERY    . CESAREAN SECTION    . 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 COLONOSCOPY  02/05/2012   DQQ:IWLNLG rectum, sigmoid diverticulosis,descending colon polyp , tubular adenoma  . CORONARY ARTERY BYPASS GRAFT     06/2006 - RIMA to RCA, SVG to RCA  . ESOPHAGOGASTRODUODENOSCOPY  10/03/2010   Dr. Gala Romney- schatzki's ring, shoft peptic stricture at GE junction.  . ESOPHAGOGASTRODUODENOSCOPY (EGD) WITH PROPOFOL N/A 02/08/2018   Procedure: ESOPHAGOGASTRODUODENOSCOPY (EGD) WITH PROPOFOL;  Surgeon: Daneil Dolin, MD;  Location: [facility] ENDO SUITE;  Service: Endoscopy;  Laterality: N/A;  8:15am  . FOOT SURGERY     bilateral bunionectomy  . HERNIA REPAIR     with mesh  . LAPAROTOMY  2007   small bowel resection secondary to small bowel obstruction  . MALONEY DILATION  02/05/2012   Procedure: Venia Minks DILATION;  Surgeon: Daneil Dolin, MD;  Location: [facility] ORS;  Service: Endoscopy;  Laterality: N/A;  49mm   . MALONEY DILATION N/A 02/08/2018   Procedure: Venia Minks DILATION;  Surgeon: Daneil Dolin, MD;  Location: [facility] ENDO SUITE;  Service: Endoscopy;  Laterality: N/A;  . Teeth removal       OB History    Gravida  3   Para  3   Term  3   Preterm      AB      Living  1     SAB      TAB      Ectopic      Multiple      Live Births               Home Medications    Prior to Admission medications   Medication Sig Start Date End Date Taking? Authorizing Provider  allopurinol (ZYLOPRIM) 100 MG tablet Take 100 mg by mouth daily.    [provider]  ALPRAZolam Duanne Moron) 0.5 MG tablet Take 0.5 mg by mouth 2 (two) times a day. 03/03/19   [provider]  bisoprolol (ZEBETA) 10 MG tablet Take 10 mg by mouth daily.  10/05/18   [provider]  budesonide-formoterol (SYMBICORT) 160-4.5 MCG/ACT inhaler Inhale 2 puffs into the lungs 2 (two) times daily. 03/31/18   Barton Dubois, MD  cetirizine (ZYRTEC) 10 MG tablet Take 10 mg by mouth daily.      [provider]  Coenzyme Q10 (CO Q 10) 100 MG CAPS  Take 1 capsule by mouth daily.    [provider]  dextromethorphan-guaiFENesin (MUCINEX DM) 30-600 MG 12hr tablet Take 1 tablet by mouth 2 (two) times daily. 05/06/19 06/05/19  Manuella Ghazi, Pratik D, DO  diltiazem (CARDIZEM CD) 120 MG 24 hr capsule Take 1 capsule (120 mg total) by mouth daily. 04/01/18   Barton Dubois, MD  furosemide (LASIX) 80 MG tablet Take 80-120 mg by mouth See admin instructions. Takes 120 mg in the am, 80 mg in the pm    [provider]  Ipratropium-Albuterol (COMBIVENT) 20-100 MCG/ACT AERS respimat Inhale 1-2 puffs into the lungs every 6 (six) hours as needed for wheezing or shortness of breath. 09/05/18  Barton Dubois, MD  levothyroxine (SYNTHROID, LEVOTHROID) 50 MCG tablet Take 50 mcg by mouth daily before breakfast.     [provider]  magnesium oxide (MAG-OX) 400 MG tablet Take 400 mg by mouth daily.    [provider]  metolazone (ZAROXOLYN) 2.5 MG tablet Take 1 tablet (2.5 mg total) by mouth as directed. May take 30 minutes before Lasix if you gain more than 3 lbs in 24 hours 05/06/19 06/05/19  Heath Lark D, DO  Multiple Vitamin (MULTIVITAMIN WITH MINERALS) TABS tablet Take 1 tablet by mouth daily.    [provider]  omeprazole (PRILOSEC) 20 MG capsule Take 20 mg by mouth daily.     [provider]  OXYGEN Inhale 2 L into the lungs daily.     [provider]  potassium chloride SA (K-DUR) 20 MEQ tablet Take 1 tablet (20 mEq total) by mouth 3 (three) times daily. 05/06/19 06/05/19  Manuella Ghazi, Pratik D, DO  rOPINIRole (REQUIP) 3 MG tablet Take 3 mg by mouth daily.     [provider]  warfarin (COUMADIN) 2 MG tablet Take 1.5-2 tablets (3-4 mg total) by mouth See admin instructions. 4mg  on Tuesday and 3 mg daily the rest of the week. Patient not taking: Reported on 05/05/2019 09/06/18   Barton Dubois, MD    Family History Family History  Problem Relation Age of Onset  . Stroke Mother   . Cancer Father   .  Anesthesia problems Neg Hx   . Hypotension Neg Hx   . Malignant hyperthermia Neg Hx   . Pseudochol deficiency Neg Hx     Social History Social History   Tobacco Use  . Smoking status: Former Smoker    Packs/day: 1.00    Years: 20.00    Pack years: 20.00    Types: Cigarettes    Quit date: 10/20/1988    Years since quitting: 30.6  . Smokeless tobacco: Never Used  Substance Use Topics  . Alcohol use: No    Alcohol/week: 0.0 standard drinks  . Drug use: No     Allergies   Penicillins   Review of Systems Review of Systems  All other systems reviewed and are negative.    Physical Exam Updated Vital Signs BP (!) 113/55 (BP Location: Right Arm)   Pulse 73   Temp 98.2 F (36.8 C) (Oral)   Resp 20   Ht 5\' 8"  (1.727 m)   Wt 81.6 kg   SpO2 93%   BMI 27.37 kg/m   Physical Exam Vitals signs and nursing note reviewed.  Constitutional:      Appearance: She is well-developed.     Comments: nad  HENT:     Head: Normocephalic and atraumatic.  Eyes:     Conjunctiva/sclera: Conjunctivae normal.  Neck:     Musculoskeletal: Neck supple.  Cardiovascular:     Rate and Rhythm: Normal rate and regular rhythm.  Pulmonary:     Effort: Pulmonary effort is normal.     Breath sounds: Normal breath sounds.  Abdominal:     General: Bowel sounds are normal.     Palpations: Abdomen is soft.  Musculoskeletal: Normal range of motion.  Skin:    General: Skin is warm and dry.     Comments: Bilateral venous stasis ulcers, but no other rash.  Neurological:     Mental Status: She is alert and oriented to person, place, and time.  Psychiatric:        Behavior: Behavior normal.  ED Treatments / Results  Labs (all labs ordered are listed, but only abnormal results are displayed) Labs Reviewed  SARS CORONAVIRUS 2  BRAIN NATRIURETIC PEPTIDE  CBC WITH DIFFERENTIAL/PLATELET  BASIC METABOLIC PANEL    EKG EKG Interpretation  Date/Time:  Saturday May 28 2019 21:38:00 EDT  Ventricular Rate:  79 PR Interval:    QRS Duration: 95 QT Interval:  363 QTC Calculation: 417 R Axis:   74 Text Interpretation:  Atrial fibrillation RSR' in V1 or V2, probably normal variant Repol abnrm, severe global ischemia (LM/MVD) Confirmed by Nat Christen [phone]) on 05/28/2019 9:41:02 PM   Radiology No results found.  Procedures Procedures (including critical care time)  Medications Ordered in ED Medications  methylPREDNISolone sodium succinate (SOLU-MEDROL) 125 mg/2 mL injection 125 mg (has no administration in time range)  famotidine (PEPCID) IVPB 20 mg premix (has no administration in time range)  diphenhydrAMINE (BENADRYL) injection 25 mg (has no administration in time range)     Initial Impression / Assessment and Plan / ED Course  I have reviewed the triage vital signs and the nursing notes.  Pertinent labs & imaging results that were available during my care of the patient were reviewed by me and considered in my medical decision making (see chart for details).        Uncertain etiology of patient's pruritus.  IV Solu-Medrol, IV Pepcid, IV Benadryl.  Chest x-ray.  COVID test pending.  Disc c Dr Betsey Holiday  CRITICAL CARE Performed by: Nat Christen Total critical care time: 30 minutes Critical care time was exclusive of separately billable procedures and treating other patients. Critical care was necessary to treat or prevent imminent or life-threatening deterioration. Critical care was time spent personally by me on the following activities: development of treatment plan with patient and/or surrogate as well as nursing, discussions with consultants, evaluation of patient's response to treatment, examination of patient, obtaining history from patient or surrogate, ordering and performing treatments and interventions, ordering and review of laboratory studies, ordering and review of radiographic studies, pulse oximetry and re-evaluation of patient's condition. Final  Clinical Impressions(s) / ED Diagnoses   Final diagnoses:  Pruritus  Chronic obstructive pulmonary disease, unspecified COPD type [facility])    ED Discharge Orders    None       Nat Christen, MD 05/28/19 2201    Nat Christen, MD 05/28/19 2213    Nat Christen, MD 06/06/19 2258

## 2019-05-29 DIAGNOSIS — N183 Chronic kidney disease, stage 3 (moderate): Secondary | ICD-10-CM

## 2019-05-29 DIAGNOSIS — I5031 Acute diastolic (congestive) heart failure: Secondary | ICD-10-CM

## 2019-05-29 DIAGNOSIS — L299 Pruritus, unspecified: Secondary | ICD-10-CM

## 2019-05-29 DIAGNOSIS — E039 Hypothyroidism, unspecified: Secondary | ICD-10-CM

## 2019-05-29 DIAGNOSIS — E876 Hypokalemia: Secondary | ICD-10-CM

## 2019-05-29 LAB — CBC
HCT: 27.1 % — ABNORMAL LOW (ref 36.0–46.0)
Hemoglobin: 8.1 g/dL — ABNORMAL LOW (ref 12.0–15.0)
MCH: 25.3 pg — ABNORMAL LOW (ref 26.0–34.0)
MCHC: 29.9 g/dL — ABNORMAL LOW (ref 30.0–36.0)
MCV: 84.7 fL (ref 80.0–100.0)
Platelets: 267 10*3/uL (ref 150–400)
RBC: 3.2 MIL/uL — ABNORMAL LOW (ref 3.87–5.11)
RDW: 16.7 % — ABNORMAL HIGH (ref 11.5–15.5)
WBC: 8.7 10*3/uL (ref 4.0–10.5)
nRBC: 0 % (ref 0.0–0.2)

## 2019-05-29 LAB — CBG MONITORING, ED: Glucose-Capillary: 109 mg/dL — ABNORMAL HIGH (ref 70–99)

## 2019-05-29 LAB — COMPREHENSIVE METABOLIC PANEL
ALT: 18 U/L (ref 0–44)
AST: 30 U/L (ref 15–41)
Albumin: 3.5 g/dL (ref 3.5–5.0)
Alkaline Phosphatase: 92 U/L (ref 38–126)
Anion gap: 10 (ref 5–15)
BUN: 26 mg/dL — ABNORMAL HIGH (ref 8–23)
CO2: 29 mmol/L (ref 22–32)
Calcium: 8.4 mg/dL — ABNORMAL LOW (ref 8.9–10.3)
Chloride: 98 mmol/L (ref 98–111)
Creatinine, Ser: 1.47 mg/dL — ABNORMAL HIGH (ref 0.44–1.00)
GFR calc Af Amer: 42 mL/min — ABNORMAL LOW (ref >60)
GFR calc non Af Amer: 37 mL/min — ABNORMAL LOW (ref >60)
Glucose, Bld: 130 mg/dL — ABNORMAL HIGH (ref 70–99)
Potassium: 3.1 mmol/L — ABNORMAL LOW (ref 3.5–5.1)
Sodium: 137 mmol/L (ref 135–145)
Total Bilirubin: 0.8 mg/dL (ref 0.3–1.2)
Total Protein: 7.2 g/dL (ref 6.5–8.1)

## 2019-05-29 LAB — SARS CORONAVIRUS 2 BY RT PCR (HOSPITAL ORDER, PERFORMED IN ~~LOC~~ HOSPITAL LAB): SARS Coronavirus 2: NEGATIVE

## 2019-05-29 LAB — TROPONIN I (HIGH SENSITIVITY)
Troponin I (High Sensitivity): 14 ng/L (ref <18)
Troponin I (High Sensitivity): 12 ng/L (ref <18)

## 2019-05-29 MED ORDER — FUROSEMIDE 40 MG PO TABS: 80.0000 mg | ORAL_TABLET | ORAL | Status: DC

## 2019-05-29 MED ORDER — ALPRAZOLAM 0.5 MG PO TABS
0.5000 mg | ORAL_TABLET | Freq: Two times a day (BID) | ORAL | Status: DC | PRN
  Administered 2019-05-29: 0.5 mg via ORAL
  Filled 2019-05-29: qty 1

## 2019-05-29 MED ORDER — POTASSIUM CHLORIDE 10 MEQ/100ML IV SOLN
10.0000 meq | INTRAVENOUS | Status: AC
  Administered 2019-05-29 (×3): 10 meq via INTRAVENOUS
  Filled 2019-05-29 (×3): qty 100

## 2019-05-29 MED ORDER — ACETAMINOPHEN 325 MG PO TABS: 650.0000 mg | ORAL_TABLET | Freq: Four times a day (QID) | ORAL | Status: DC | PRN

## 2019-05-29 MED ORDER — LORATADINE 10 MG PO TABS
10.0000 mg | ORAL_TABLET | Freq: Every day | ORAL | Status: DC
  Administered 2019-05-29 – 2019-05-30 (×2): 10 mg via ORAL
  Filled 2019-05-29 (×2): qty 1

## 2019-05-29 MED ORDER — LEVOTHYROXINE SODIUM 50 MCG PO TABS
50.0000 ug | ORAL_TABLET | Freq: Every day | ORAL | Status: DC
  Administered 2019-05-29 – 2019-05-30 (×2): 50 ug via ORAL
  Filled 2019-05-29 (×2): qty 1

## 2019-05-29 MED ORDER — FUROSEMIDE 10 MG/ML IJ SOLN
80.0000 mg | Freq: Once | INTRAMUSCULAR | Status: AC
  Administered 2019-05-29: 80 mg via INTRAVENOUS
  Filled 2019-05-29: qty 8

## 2019-05-29 MED ORDER — IPRATROPIUM-ALBUTEROL 0.5-2.5 (3) MG/3ML IN SOLN
3.0000 mL | Freq: Four times a day (QID) | RESPIRATORY_TRACT | Status: DC | PRN
  Administered 2019-05-29: 3 mL via RESPIRATORY_TRACT
  Filled 2019-05-29: qty 3

## 2019-05-29 MED ORDER — ALLOPURINOL 100 MG PO TABS
100.0000 mg | ORAL_TABLET | Freq: Every day | ORAL | Status: DC
  Administered 2019-05-29 – 2019-05-30 (×2): 100 mg via ORAL
  Filled 2019-05-29 (×3): qty 1

## 2019-05-29 MED ORDER — FUROSEMIDE 80 MG PO TABS
120.0000 mg | ORAL_TABLET | Freq: Every day | ORAL | Status: DC
  Administered 2019-05-30: 120 mg via ORAL
  Filled 2019-05-29: qty 1

## 2019-05-29 MED ORDER — SODIUM CHLORIDE 0.9% FLUSH
3.0000 mL | Freq: Two times a day (BID) | INTRAVENOUS | Status: DC
  Administered 2019-05-29 – 2019-05-30 (×3): 3 mL via INTRAVENOUS

## 2019-05-29 MED ORDER — DILTIAZEM HCL ER COATED BEADS 120 MG PO CP24
120.0000 mg | ORAL_CAPSULE | Freq: Every day | ORAL | Status: DC
  Administered 2019-05-29 – 2019-05-30 (×2): 120 mg via ORAL
  Filled 2019-05-29 (×3): qty 1

## 2019-05-29 MED ORDER — DIPHENHYDRAMINE HCL 50 MG/ML IJ SOLN
25.0000 mg | Freq: Four times a day (QID) | INTRAMUSCULAR | Status: DC | PRN
  Administered 2019-05-29: 25 mg via INTRAVENOUS
  Filled 2019-05-29: qty 1

## 2019-05-29 MED ORDER — BISOPROLOL FUMARATE 5 MG PO TABS
10.0000 mg | ORAL_TABLET | Freq: Every day | ORAL | Status: DC
  Administered 2019-05-29 – 2019-05-30 (×2): 10 mg via ORAL
  Filled 2019-05-29: qty 1
  Filled 2019-05-29: qty 2
  Filled 2019-05-29: qty 1

## 2019-05-29 MED ORDER — SODIUM CHLORIDE 0.9% FLUSH: 3.0000 mL | INTRAVENOUS | Status: DC | PRN

## 2019-05-29 MED ORDER — HYDROXYZINE HCL 50 MG/ML IM SOLN
50.0000 mg | Freq: Once | INTRAMUSCULAR | Status: AC
  Administered 2019-05-29: 50 mg via INTRAMUSCULAR
  Filled 2019-05-29: qty 1

## 2019-05-29 MED ORDER — PANTOPRAZOLE SODIUM 40 MG PO TBEC
40.0000 mg | DELAYED_RELEASE_TABLET | Freq: Every day | ORAL | Status: DC
  Administered 2019-05-29 – 2019-05-30 (×2): 40 mg via ORAL
  Filled 2019-05-29 (×2): qty 1

## 2019-05-29 MED ORDER — ACETAMINOPHEN 650 MG RE SUPP: 650.0000 mg | Freq: Four times a day (QID) | RECTAL | Status: DC | PRN

## 2019-05-29 MED ORDER — FUROSEMIDE 80 MG PO TABS
80.0000 mg | ORAL_TABLET | Freq: Every evening | ORAL | Status: DC
  Administered 2019-05-29: 80 mg via ORAL

## 2019-05-29 MED ORDER — ADULT MULTIVITAMIN W/MINERALS CH
1.0000 | ORAL_TABLET | Freq: Every day | ORAL | Status: DC
  Administered 2019-05-29 – 2019-05-30 (×2): 1 via ORAL
  Filled 2019-05-29 (×2): qty 1

## 2019-05-29 MED ORDER — DM-GUAIFENESIN ER 30-600 MG PO TB12
1.0000 | ORAL_TABLET | Freq: Two times a day (BID) | ORAL | Status: DC
  Administered 2019-05-29: 1 via ORAL
  Filled 2019-05-29: qty 1

## 2019-05-29 MED ORDER — DEXTROMETHORPHAN POLISTIREX ER 30 MG/5ML PO SUER
15.0000 mg | Freq: Two times a day (BID) | ORAL | Status: DC
  Administered 2019-05-29 – 2019-05-30 (×3): 15 mg via ORAL
  Filled 2019-05-29 (×5): qty 5

## 2019-05-29 MED ORDER — MAGNESIUM OXIDE 400 (241.3 MG) MG PO TABS
400.0000 mg | ORAL_TABLET | Freq: Every day | ORAL | Status: DC
  Administered 2019-05-29 – 2019-05-30 (×2): 400 mg via ORAL
  Filled 2019-05-29 (×6): qty 1

## 2019-05-29 MED ORDER — MOMETASONE FURO-FORMOTEROL FUM 200-5 MCG/ACT IN AERO
2.0000 | INHALATION_SPRAY | Freq: Two times a day (BID) | RESPIRATORY_TRACT | Status: DC
  Administered 2019-05-29 – 2019-05-30 (×3): 2 via RESPIRATORY_TRACT
  Filled 2019-05-29: qty 8.8

## 2019-05-29 MED ORDER — ROPINIROLE HCL 1 MG PO TABS
3.0000 mg | ORAL_TABLET | Freq: Every day | ORAL | Status: DC
  Administered 2019-05-29 – 2019-05-30 (×2): 3 mg via ORAL
  Filled 2019-05-29 (×3): qty 3

## 2019-05-29 MED ORDER — SODIUM CHLORIDE 0.9 % IV SOLN: 250.0000 mL | INTRAVENOUS | Status: DC | PRN

## 2019-05-29 MED ORDER — POTASSIUM CHLORIDE CRYS ER 20 MEQ PO TBCR: 20.0000 meq | EXTENDED_RELEASE_TABLET | Freq: Three times a day (TID) | ORAL | Status: DC

## 2019-05-29 MED ORDER — POTASSIUM CHLORIDE CRYS ER 20 MEQ PO TBCR
40.0000 meq | EXTENDED_RELEASE_TABLET | Freq: Two times a day (BID) | ORAL | Status: DC
  Administered 2019-05-29 – 2019-05-30 (×3): 40 meq via ORAL
  Filled 2019-05-29 (×3): qty 2

## 2019-05-29 NOTE — Progress Notes (Signed)
Dressing to RLE removed, cleaned with NSS. Non-adherent pad and foam applied.

## 2019-05-29 NOTE — Progress Notes (Signed)
Patient seen and examined. Admitted after midnight secondary to acute on chronic resp failure with hypoxia, generalized itching and hypokalemia. Patient found with mild exacerbation of chronic diastolic HF and most likely hypokalemia in the setting of diuretics usage at home. Please refer to H&P written by Dr. Maudie Mercury for further info/details on admission.  Plan: -continue diuretics -follow O2 saturation -replete potassium and start adjusted ose for daily supplementation  -PRN benadryl for itching.    Barton Dubois MD [phone]

## 2019-05-29 NOTE — ED Notes (Signed)
Pt restless in bed, continues to ramble in conversation

## 2019-05-29 NOTE — ED Notes (Signed)
Pt resting with eyes closed,  No distress noted,

## 2019-05-29 NOTE — ED Notes (Signed)
Received report on pt, pt resting, update given, pt depends changed due to being incontinent of urine, pure wick replaced on pt, Afib noted on monitor,

## 2019-05-29 NOTE — ED Provider Notes (Signed)
Patient presents to the emergency department for evaluation of itching.  She reportedly has been experiencing itching for 2 weeks.  She was seen by Dr. Lacinda Axon initially and signed out to me to follow-up.  She was treated with Solu-Medrol, Pepcid, Benadryl for her itching.  She reports no improvement.  Work-up reveals recurrent hypokalemia as well as mild pulmonary edema.  She has a history of congestive heart failure.  She normally uses oxygen at home continuously, is much more dyspneic than her normal baseline here in the ER.  She will therefore require hospitalization for diuresis.  This will be complicated by her hypokalemia, potassium levels will need to be monitored closely.   Orpah Greek, MD 05/29/19 [phone]

## 2019-05-29 NOTE — ED Notes (Signed)
Pharmacy called, waiting for patient 10am meds to be brought.

## 2019-05-29 NOTE — H&P (Addendum)
TRH H&P    Patient Demographics:    [name], is a 68 y.o. female  MRN: [id number]  DOB - 12/26/1950  Admit Date - 05/28/2019  Referring MD/NP/PA: Mathis Fare  Outpatient Primary MD for the patient is Octavio Graves, DO (Inactive)  Patient coming from:  home  Chief complaint- dyspnea   HPI:    [name]  is a 68 y.o. female,h/o Aortic stenosis s/p AVR 2007(St Jude), CAD s/p CABG, Pafib, CKD stage3, Anemia,  Asthma, and chronic hypoxemia on 2L O2NC presents with c/o cough x 3 weeks.  And itching for the past 2 weeks.   In ED,  T 98.2, P 73  R 20 Bp 113/55 pox 93% on 2L [place]  CXR IMPRESSION: Cardiomegaly with vascular congestion and possible early pulmonary edema.  Wbc 8.4, Hgb 8.3, Plt 309 Na 140, K 2.6 Magnesium 1.8 Bun 27, Creatinine 1.49 BNP 653 INR 2.8  ekg afib at 80, nl axis, t inversion in 2, 3, avf, v4-6   Pt will be admitted for acute hypokalemia, and mild acute on chronic diastolic chf    Review of systems:    In addition to the HPI above,  No Fever-chills, No Headache, No changes with Vision or hearing, No problems swallowing food or Liquids, No Chest pain,  No Abdominal pain, No Nausea or Vomiting, bowel movements are regular, No Blood in stool or Urine, No dysuria, No new skin rashes or bruises, No new joints pains-aches,  No new weakness, tingling, numbness in any extremity, No recent weight gain or loss, No polyuria, polydypsia or polyphagia, No significant Mental Stressors.  All other systems reviewed and are negative.    Past History of the following :    Past Medical History:  Diagnosis Date  . Acute on chronic diastolic (congestive) heart failure ([place])   . Anxiety disorder   . Aortic stenosis    Status post St. Jude mechanical AVR 2007  . Asthma   . Atrial fibrillation ([place])   . Carcinoid tumor of colon 2007  . Coronary atherosclerosis  of native coronary artery    Status post CABG 2007  . GERD (gastroesophageal reflux disease)   . History of colonoscopy 2003   Dr. Gala Romney - normal  . Hyperlipidemia   . Macromastia   . Pedal edema    Bilateral, chronic  . Peptic stricture of esophagus 10/04/2010   GE junction on last EGD by Dr. Gala Romney, benign biopsies  . RLS (restless legs syndrome)   . Schatzki's ring       Past Surgical History:  Procedure Laterality Date  . ABDOMINAL HYSTERECTOMY    . AORTIC VALVE REPLACEMENT  2007   #25 mm St. 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 APPENDECTOMY  2007  . BACK SURGERY     lumbar 4 and 5   . BIOPSY  02/05/2012   RMR:Two tongues of salmon-colored epithelium distal esophagus, very short-segment Barrett's s/p bx/Small hiatal hernia, otherwise normal stomach, D1, D2. Status post esophageal dilation. Biopsy  showed GERD.  . Breast cyst removed     bilateral  . BREAST REDUCTION SURGERY    . CESAREAN SECTION    . 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 COLONOSCOPY  02/05/2012   GEX:BMWUXL rectum, sigmoid diverticulosis,descending colon polyp , tubular adenoma  . CORONARY ARTERY BYPASS GRAFT     06/2006 - RIMA to RCA, SVG to RCA  . ESOPHAGOGASTRODUODENOSCOPY  10/03/2010   Dr. Gala Romney- schatzki's ring, shoft peptic stricture at GE junction.  . ESOPHAGOGASTRODUODENOSCOPY (EGD) WITH PROPOFOL N/A 02/08/2018   Procedure: ESOPHAGOGASTRODUODENOSCOPY (EGD) WITH PROPOFOL;  Surgeon: Daneil Dolin, MD;  Location: [facility];  Service: Endoscopy;  Laterality: N/A;  8:15am  . FOOT SURGERY     bilateral bunionectomy  . HERNIA REPAIR     with mesh  . LAPAROTOMY  2007   small bowel resection secondary to small bowel obstruction  . MALONEY DILATION  02/05/2012   Procedure: Venia Minks DILATION;  Surgeon: Daneil Dolin, MD;  Location: [facility];  Service: Endoscopy;  Laterality: N/A;  41mm   . MALONEY DILATION N/A 02/08/2018   Procedure:  Venia Minks DILATION;  Surgeon: Daneil Dolin, MD;  Location: [facility];  Service: Endoscopy;  Laterality: N/A;  . Teeth removal        Social History:      Social History   Tobacco Use  . Smoking status: Former Smoker    Packs/day: 1.00    Years: 20.00    Pack years: 20.00    Types: Cigarettes    Quit date: 10/20/1988    Years since quitting: 30.6  . Smokeless tobacco: Never Used  Substance Use Topics  . Alcohol use: No    Alcohol/week: 0.0 standard drinks       Family History :     Family History  Problem Relation Age of Onset  . Stroke Mother   . Cancer Father   . Anesthesia problems Neg Hx   . Hypotension Neg Hx   . Malignant hyperthermia Neg Hx   . Pseudochol deficiency Neg Hx        Home Medications:   Prior to Admission medications   Medication Sig Start Date End Date Taking? Authorizing Provider  allopurinol (ZYLOPRIM) 100 MG tablet Take 100 mg by mouth daily.   Yes [provider]  bisoprolol (ZEBETA) 10 MG tablet Take 10 mg by mouth daily.  10/05/18  Yes [provider]  budesonide-formoterol (SYMBICORT) 160-4.5 MCG/ACT inhaler Inhale 2 puffs into the lungs 2 (two) times daily. 03/31/18  Yes Barton Dubois, MD  Coenzyme Q10 (CO Q 10) 100 MG CAPS Take 1 capsule by mouth daily.   Yes [provider]  diltiazem (CARDIZEM CD) 120 MG 24 hr capsule Take 1 capsule (120 mg total) by mouth daily. 04/01/18  Yes Barton Dubois, MD  furosemide (LASIX) 80 MG tablet Take 80-120 mg by mouth See admin instructions. Takes 120 mg in the am, 80 mg in the pm   Yes [provider]  Ipratropium-Albuterol (COMBIVENT) 20-100 MCG/ACT AERS respimat Inhale 1-2 puffs into the lungs every 6 (six) hours as needed for wheezing or shortness of breath. 09/05/18  Yes Barton Dubois, MD  levothyroxine (SYNTHROID, LEVOTHROID) 50 MCG tablet Take 50 mcg by mouth daily before breakfast.    Yes [provider]  Multiple Vitamin (MULTIVITAMIN WITH  MINERALS) TABS tablet Take 1 tablet by mouth daily.   Yes [provider]  omeprazole (PRILOSEC) 20 MG capsule  Take 20 mg by mouth daily.    Yes [provider]  potassium chloride SA (K-DUR) 20 MEQ tablet Take 1 tablet (20 mEq total) by mouth 3 (three) times daily. 05/06/19 06/05/19 Yes Shah, Pratik D, DO  rOPINIRole (REQUIP) 3 MG tablet Take 3 mg by mouth daily.    Yes [provider]  ALPRAZolam Duanne Moron) 0.5 MG tablet Take 0.5 mg by mouth 2 (two) times a day. 03/03/19   [provider]  cetirizine (ZYRTEC) 10 MG tablet Take 10 mg by mouth daily.      [provider]  dextromethorphan-guaiFENesin (MUCINEX DM) 30-600 MG 12hr tablet Take 1 tablet by mouth 2 (two) times daily. 05/06/19 06/05/19  Manuella Ghazi, Pratik D, DO  magnesium oxide (MAG-OX) 400 MG tablet Take 400 mg by mouth daily.    [provider]  metolazone (ZAROXOLYN) 2.5 MG tablet Take 1 tablet (2.5 mg total) by mouth as directed. May take 30 minutes before Lasix if you gain more than 3 lbs in 24 hours 05/06/19 06/05/19  Manuella Ghazi, Pratik D, DO  OXYGEN Inhale 2 L into the lungs daily.     [provider]  warfarin (COUMADIN) 2 MG tablet Take 1.5-2 tablets (3-4 mg total) by mouth See admin instructions. 4mg  on Tuesday and 3 mg daily the rest of the week. Patient not taking: Reported on 05/05/2019 09/06/18   Barton Dubois, MD     Allergies:     Allergies  Allergen Reactions  . Penicillins Hives, Itching, Swelling and Rash    Has patient had a PCN reaction causing immediate rash, facial/tongue/throat swelling, SOB or lightheadedness with hypotension: Yes Has patient had a PCN reaction causing severe rash involving mucus membranes or skin necrosis: Yes Has patient had a PCN reaction that required hospitalization: unknown Has patient had a PCN reaction occurring within the last 10 years: No If all of the above answers are "NO", then may proceed with Cephalosporin use.    Throat swelling      Physical Exam:   Vitals  Blood pressure 128/70, pulse 86, temperature 98.2 F (36.8 C), temperature source Oral, resp. rate (!) 22, height 5\' 8"  (1.727 m), weight 81.6 kg, SpO2 95 %.  1.  General: axoxo3  2. Psychiatric: euthymic  3. Neurologic: cn2-12  Intact, reflexes 2+ symmetric, diffuse with no clonus, motor 5/5 in all 4 ext  4. HEENMT:  Anicteric, pupils 1.65mm symmetric, direct consensual, near intact Neck: no jvd,   5. Respiratory : Prolonged exp phase, trace bibasilar crackles, no wheezing  6. Cardiovascular : rrr s1, s2,   7. Gastrointestinal:  Abd: soft, nt, nd, +bs  8. Skin:  Ext: no c/c/e,  No rash  9.Musculoskeletal:  Good ROM,  No adenoapthy    Data Review:    CBC Recent Labs  Lab 05/28/19 2142  WBC 8.4  HGB 8.3*  HCT 28.2*  PLT 309  MCV 85.5  MCH 25.2*  MCHC 29.4*  RDW 16.7*  LYMPHSABS 1.2  MONOABS 0.9  EOSABS 0.1  BASOSABS 0.0   ------------------------------------------------------------------------------------------------------------------  Results for orders placed or performed during the hospital encounter of 05/28/19 (from the past 48 hour(s))  Brain natriuretic peptide     Status: Abnormal   Collection Time: 05/28/19  9:42 PM  Result Value Ref Range   B Natriuretic Peptide 653.0 (H) 0.0 - 100.0 pg/mL    Comment: Performed at [facility], [address] [place], [place] [address]  CBC with Differential     Status: Abnormal  Collection Time: 05/28/19  9:42 PM  Result Value Ref Range   WBC 8.4 4.0 - 10.5 K/uL   RBC 3.30 (L) 3.87 - 5.11 MIL/uL   Hemoglobin 8.3 (L) 12.0 - 15.0 g/dL   HCT 28.2 (L) 36.0 - 46.0 %   MCV 85.5 80.0 - 100.0 fL   MCH 25.2 (L) 26.0 - 34.0 pg   MCHC 29.4 (L) 30.0 - 36.0 g/dL   RDW 16.7 (H) 11.5 - 15.5 %   Platelets 309 150 - 400 K/uL   nRBC 0.0 0.0 - 0.2 %   Neutrophils Relative % 73 %   Neutro Abs 6.2 1.7 - 7.7 K/uL   Lymphocytes Relative 15 %   Lymphs Abs 1.2 0.7 - 4.0 K/uL   Monocytes  Relative 10 %   Monocytes Absolute 0.9 0.1 - 1.0 K/uL   Eosinophils Relative 1 %   Eosinophils Absolute 0.1 0.0 - 0.5 K/uL   Basophils Relative 0 %   Basophils Absolute 0.0 0.0 - 0.1 K/uL   Immature Granulocytes 1 %   Abs Immature Granulocytes 0.05 0.00 - 0.07 K/uL    Comment: Performed at [facility], [address] [place], [place] [address]  Basic metabolic panel     Status: Abnormal   Collection Time: 05/28/19  9:42 PM  Result Value Ref Range   Sodium 140 135 - 145 mmol/L   Potassium 2.6 (LL) 3.5 - 5.1 mmol/L    Comment: CRITICAL RESULT CALLED TO, READ BACK BY AND VERIFIED WITH: POINDEXTER,M @ 2321 ON 05/28/19 BY JUW    Chloride 96 (L) 98 - 111 mmol/L   CO2 32 22 - 32 mmol/L   Glucose, Bld 102 (H) 70 - 99 mg/dL   BUN 27 (H) 8 - 23 mg/dL   Creatinine, Ser 1.49 (H) 0.44 - 1.00 mg/dL   Calcium 8.9 8.9 - 10.3 mg/dL   GFR calc non Af Amer 36 (L) >60 mL/min   GFR calc Af Amer 42 (L) >60 mL/min   Anion gap 12 5 - 15    Comment: Performed at [facility], [address] [place], [place] [address]  Protime-INR     Status: Abnormal   Collection Time: 05/28/19  9:42 PM  Result Value Ref Range   Prothrombin Time 29.2 (H) 11.4 - 15.2 seconds   INR 2.8 (H) 0.8 - 1.2    Comment: (NOTE) INR goal varies based on device and disease states. Performed at [facility], [address], [place], [place] [address]   Magnesium     Status: None   Collection Time: 05/28/19  9:42 PM  Result Value Ref Range   Magnesium 1.8 1.7 - 2.4 mg/dL    Comment: Performed at [facility], [address], [place], [place] [address]  SARS Coronavirus 2 [facility] order, Performed in [facility] hospital lab) Nasopharyngeal Nasopharyngeal Swab     Status: None   Collection Time: 05/28/19 10:49 PM   Specimen: Nasopharyngeal Swab  Result Value Ref Range   SARS Coronavirus 2 NEGATIVE NEGATIVE    Comment: (NOTE) If result is NEGATIVE SARS-CoV-2 target nucleic acids are NOT DETECTED. The SARS-CoV-2 RNA is generally  detectable in upper and lower  respiratory specimens during the acute phase of infection. The lowest  concentration of SARS-CoV-2 viral copies this assay can detect is 250  copies / mL. A negative result does not preclude SARS-CoV-2 infection  and should not be used as the sole basis for treatment or other  patient management decisions.  A negative result may occur with  improper specimen collection / handling, submission of specimen other  than nasopharyngeal swab, presence of viral mutation(s) within the  areas targeted by this assay, and inadequate number of viral copies  (<250 copies / mL). A negative result must be combined with clinical  observations, patient history, and epidemiological information. If result is POSITIVE SARS-CoV-2 target nucleic acids are DETECTED. The SARS-CoV-2 RNA is generally detectable in upper and lower  respiratory specimens dur ing the acute phase of infection.  Positive  results are indicative of active infection with SARS-CoV-2.  Clinical  correlation with patient history and other diagnostic information is  necessary to determine patient infection status.  Positive results do  not rule out bacterial infection or co-infection with other viruses. If result is PRESUMPTIVE POSTIVE SARS-CoV-2 nucleic acids MAY BE PRESENT.   A presumptive positive result was obtained on the submitted specimen  and confirmed on repeat testing.  While 2019 novel coronavirus  (SARS-CoV-2) nucleic acids may be present in the submitted sample  additional confirmatory testing may be necessary for epidemiological  and / or clinical management purposes  to differentiate between  SARS-CoV-2 and other Sarbecovirus currently known to infect humans.  If clinically indicated additional testing with an alternate test  methodology [phone]) is advised. The SARS-CoV-2 RNA is generally  detectable in upper and lower respiratory sp ecimens during the acute  phase of infection. The  expected result is Negative. Fact Sheet for Patients:  StrictlyIdeas.no Fact Sheet for Healthcare Providers: BankingDealers.co.za This test is not yet approved or cleared by the [place] FDA and has been authorized for detection and/or diagnosis of SARS-CoV-2 by FDA under an Emergency Use Authorization (EUA).  This EUA will remain in effect (meaning this test can be used) for the duration of the COVID-19 declaration under Section 564(b)(1) of the Act, 21 U.S.C. section 360bbb-3(b)(1), unless the authorization is terminated or revoked sooner. Performed at [facility], [address] [place], [place] [address]   Troponin I (High Sensitivity)     Status: None   Collection Time: 05/28/19 10:58 PM  Result Value Ref Range   Troponin I (High Sensitivity) 14 <18 ng/L    Comment: (NOTE) Elevated high sensitivity troponin I (hsTnI) values and significant  changes across serial measurements may suggest ACS but many other  chronic and acute conditions are known to elevate hsTnI results.  Refer to the "Links" section for chest pain algorithms and additional  guidance. Performed at [facility], [address], [place], [place] [address]     Chemistries  Recent Labs  Lab 05/28/19 2142  NA 140  K 2.6*  CL 96*  CO2 32  GLUCOSE 102*  BUN 27*  CREATININE 1.49*  CALCIUM 8.9  MG 1.8   ------------------------------------------------------------------------------------------------------------------  ------------------------------------------------------------------------------------------------------------------ GFR: Estimated Creatinine Clearance: 41.1 mL/min (A) (by C-G formula based on SCr of 1.49 mg/dL (H)). Liver Function Tests: No results for input(s): AST, ALT, ALKPHOS, BILITOT, PROT, ALBUMIN in the last 168 hours. No results for input(s): LIPASE, AMYLASE in the last 168 hours. No results for input(s): AMMONIA in the last 168  hours. Coagulation Profile: Recent Labs  Lab 05/28/19 2142  INR 2.8*   Cardiac Enzymes: No results for input(s): CKTOTAL, CKMB, CKMBINDEX, TROPONINI in the last 168 hours. BNP (last 3 results) No results for input(s): PROBNP in the last 8760 hours. HbA1C: No results for input(s): HGBA1C in the last 72 hours. CBG: No results for input(s): GLUCAP in the last 168  hours. Lipid Profile: No results for input(s): CHOL, HDL, LDLCALC, TRIG, CHOLHDL, LDLDIRECT in the last 72 hours. Thyroid Function Tests: No results for input(s): TSH, T4TOTAL, FREET4, T3FREE, THYROIDAB in the last 72 hours. Anemia Panel: No results for input(s): VITAMINB12, FOLATE, FERRITIN, TIBC, IRON, RETICCTPCT in the last 72 hours.  --------------------------------------------------------------------------------------------------------------- Urine analysis: No results found for: COLORURINE, APPEARANCEUR, LABSPEC, PHURINE, GLUCOSEU, HGBUR, BILIRUBINUR, KETONESUR, PROTEINUR, UROBILINOGEN, NITRITE, LEUKOCYTESUR    Imaging Results:    Dg Chest 1 View  Result Date: 05/28/2019 CLINICAL DATA:  Shortness of breath EXAM: CHEST  1 VIEW COMPARISON:  May 05, 2019. FINDINGS: The heart size is significantly enlarged. The patient is status post prior median sternotomy. There is vascular congestion. No acute osseous abnormality. No pneumothorax. There may be trace bilateral pleural effusions. IMPRESSION: Cardiomegaly with vascular congestion and possible early pulmonary edema. Electronically Signed   By: Constance Holster M.D.   On: 05/28/2019 22:16       Assessment & Plan:    Principal Problem:   Hypokalemia Active Problems:   Coronary atherosclerosis of native coronary artery   Atrial fibrillation, chronic   CKD (chronic kidney disease) stage 3, GFR 30-59 ml/min (HCC)   Hypothyroidism   Acute diastolic CHF (congestive heart failure) (HCC)   Itching  Acute hypokalemia secondary to diuretics Pt states that she has been  compliant with her medication repleted in ED , Check cmp in am  Itching ? Secondary to Trelegy  STOP Trelegy Benadryl 25mg  iv q6h prn   Mild Acute Diastolic CHF, CAD s/p CABG Tele Trop I q2h x2 Lasix 80mg  iv x1 given in ED Cont Lasix 120mg  po qam, and 80mg  po qpm Cont Zaroxolyn Cont Cardizem CD 120mg  po qday Cont Bisoprolol 10mg  po qday  H/o AVR (St. Jude) Cont Coumadin pharmacy to dose  Pafib Tele Cont Cardizem as above Cont Bisoprolol as above Cont Coumadin as above  CKD stage3 Check cmp in am  Anemia Check cbc in am  Asthma STOP Trelegy Cont Symbicort 2puff bid Cont Combivent   H/o Hypomagnesemia Cont Magnesium oxide 400mg  po qday  Hypothyroidism Cont Levothyroxine 50 micrograms po qday  Gerd Cont PPI  Gout Cont Allopurinol 100mg  po qday  Anxiety Cont Xanax prn   RLS Cont Requip   DVT Prophylaxis-   Coumadin pharmacy to dose,  - SCDs   AM Labs Ordered, also please review Full Orders  Family Communication: Admission, patients condition and plan of care including tests being ordered have been discussed with the patient  who indicate understanding and agree with the plan and Code Status.  Code Status:  FULL CODE, notified son that patient will be admitted for hypokalemia and itching   Admission status: Observation: Based on patients clinical presentation and evaluation of above clinical data, I have made determination that patient meets observation criteria at this time.    Time spent in minutes : 70   Jani Gravel M.D on 05/29/2019 at 2:05 AM

## 2019-05-30 DIAGNOSIS — L298 Other pruritus: Secondary | ICD-10-CM

## 2019-05-30 DIAGNOSIS — L299 Pruritus, unspecified: Secondary | ICD-10-CM | POA: Diagnosis not present

## 2019-05-30 DIAGNOSIS — I5033 Acute on chronic diastolic (congestive) heart failure: Secondary | ICD-10-CM | POA: Diagnosis not present

## 2019-05-30 DIAGNOSIS — E876 Hypokalemia: Secondary | ICD-10-CM | POA: Diagnosis not present

## 2019-05-30 DIAGNOSIS — N183 Chronic kidney disease, stage 3 (moderate): Secondary | ICD-10-CM | POA: Diagnosis not present

## 2019-05-30 DIAGNOSIS — J449 Chronic obstructive pulmonary disease, unspecified: Secondary | ICD-10-CM

## 2019-05-30 DIAGNOSIS — T50905A Adverse effect of unspecified drugs, medicaments and biological substances, initial encounter: Secondary | ICD-10-CM | POA: Diagnosis not present

## 2019-05-30 DIAGNOSIS — E039 Hypothyroidism, unspecified: Secondary | ICD-10-CM | POA: Diagnosis not present

## 2019-05-30 LAB — BASIC METABOLIC PANEL
Anion gap: 12 (ref 5–15)
BUN: 40 mg/dL — ABNORMAL HIGH (ref 8–23)
CO2: 28 mmol/L (ref 22–32)
Calcium: 8.8 mg/dL — ABNORMAL LOW (ref 8.9–10.3)
Chloride: 96 mmol/L — ABNORMAL LOW (ref 98–111)
Creatinine, Ser: 1.84 mg/dL — ABNORMAL HIGH (ref 0.44–1.00)
GFR calc Af Amer: 32 mL/min — ABNORMAL LOW (ref >60)
GFR calc non Af Amer: 28 mL/min — ABNORMAL LOW (ref >60)
Glucose, Bld: 120 mg/dL — ABNORMAL HIGH (ref 70–99)
Potassium: 3.4 mmol/L — ABNORMAL LOW (ref 3.5–5.1)
Sodium: 136 mmol/L (ref 135–145)

## 2019-05-30 MED ORDER — HYDROXYZINE HCL 10 MG PO TABS: 10.0000 mg | ORAL_TABLET | Freq: Three times a day (TID) | ORAL | 0 refills | Status: DC | PRN

## 2019-05-30 MED ORDER — POTASSIUM CHLORIDE CRYS ER 20 MEQ PO TBCR: 40.0000 meq | EXTENDED_RELEASE_TABLET | Freq: Two times a day (BID) | ORAL | 2 refills | Status: DC

## 2019-05-30 MED ORDER — DEXTROMETHORPHAN POLISTIREX ER 30 MG/5ML PO SUER: 15.0000 mg | Freq: Two times a day (BID) | ORAL | 0 refills | Status: DC

## 2019-05-30 MED ORDER — OMEPRAZOLE 20 MG PO CPDR: 20.0000 mg | DELAYED_RELEASE_CAPSULE | Freq: Two times a day (BID) | ORAL | 1 refills | Status: DC

## 2019-05-30 NOTE — Care Management Obs Status (Signed)
[facility] NOTIFICATION   Patient Details  Name: [name] MRN: [id number] Date of Birth: August 19, 1951   Medicare Observation Status Notification Given:  Yes    Tommy Medal 05/30/2019, 12:40 PM

## 2019-05-30 NOTE — Discharge Summary (Signed)
Physician Discharge Summary  [name] RXV:[id number] DOB: 07/08/1951 DOA: 05/28/2019  PCP: Amy Graves, DO (Inactive)  Admit date: 05/28/2019 Discharge date: 05/30/2019  Time spent: 35 minutes  Recommendations for Outpatient Follow-up:  1. Repeat basic metabolic panel to follow electrolytes and renal function 2. Reassess patient blood pressure and volume status with further adjustment antihypertensives/diuretics regimen as needed 3. Patient will benefit of follow-up with a pulmonologist to further assess management long-term and of asthma/COPD; also to further assist with work-up and management of chronic cough.   Discharge Diagnoses:  Principal Problem:   Hypokalemia Active Problems:   Coronary atherosclerosis of native coronary artery   Atrial fibrillation, chronic   CKD (chronic kidney disease) stage 3, GFR 30-59 ml/min (HCC)   Hypothyroidism   Acute diastolic CHF (congestive heart failure) (HCC)   Itching   Discharge Condition: Stable and improved.  Patient discharged home with instruction to follow-up with PCP in 10 days.  Diet recommendation: Low-sodium diet.  Filed Weights   05/28/19 2112 05/29/19 1700 05/30/19 0605  Weight: 81.6 kg 84.5 kg 58.2 kg    History of present illness:  As per H&P written by Dr. Maudie Mercury on 05/29/19 [name]  is a 68 y.o. female,h/o Aortic stenosis s/p AVR 2007([employer]), CAD s/p CABG, Pafib, CKD stage3, Anemia,  Asthma, and chronic hypoxemia on 2L O2NC presents with c/o cough x 3 weeks.  And itching for the past 2 weeks.   In ED,  T 98.2, P 73  R 20 Bp 113/55 pox 93% on 2L [place]  CXR IMPRESSION: Cardiomegaly with vascular congestion and possible early pulmonary edema.  Wbc 8.4, Hgb 8.3, Plt 309 Na 140, K 2.6 Magnesium 1.8 Bun 27, Creatinine 1.49 BNP 653 INR 2.8  EKG afib at 80, nl axis, t inversion in 2, 3, avf, v4-6  Pt will be admitted for acute hypokalemia, and mild acute on chronic diastolic chf  Hospital Course:   1-mild acute on chronic diastolic heart failure -Significant improvement after IV Lasix given -Continue home Lasix 120 mg in the morning and 80 mg in the evening -Continue metolazone -Continue bisoprolol -Continue outpatient follow-up with cardiology service.  2-history of AVR -Stable -Continue Coumadin  3-hypokalemia -In the setting of chronic diuretics usage -Repleted and stabilized -Patient discharged on adjusted daily supplementation of potassium -Repeat basic metabolic panel at follow-up visit to reassess electrolytes trend.  4-paroxysmal atrial fibrillation -currently with controlled rate -Continue Cardizem and bisoprolol -Continue Coumadin for secondary prevention.  5-Chronic kidney disease stage III -stable and at baseline -will continue monitoring renal function trend.   6-anemia of chronic kidney disease no signs of overt bleeding -No need for transfusion -Continue monitoring hemoglobin.  7-history of asthma/COPD -Continue Symbicort -Continue Combivent -Overall breathing is a stable and no wheezing -Continue chronic oxygen supplementation.  8-history of hypothyroidism -Continue Synthroid  9-gastroesophageal reflux disease -Continue PPI -Dose again adjusted to twice a day to better control patient's symptoms.  10-18: -Appears to be associated with reaction to new inhaler prescribed (Trelegy) -Medication has been discontinued -Will continue the use of PRN Atarax to assist with itching -Patient advised to maintain skin clean and to apply good amount of moisturizer -Instructed not to scratch.   *The rest of patient's medical problems has remained stable and the plan is to continue current medication management.  Outpatient follow-up with PCP to further adjust her medications as needed.  Procedures:  See below for x-ray reports.  Consultations:  None  Discharge Exam: Vitals:   05/30/19  3267 05/30/19 0900  BP:    Pulse:    Resp:    Temp:     SpO2: 90% 94%    General: afebrile, no CP, improved breathing. No nausea, no vomiting. Cardiovascular: S1 and S2, no rubs, no gallops. Respiratory: improved air movement, no wheezing, no frank crackles. Abd: soft, NT, ND, positive BS. Extremities: no cyanosis, no clubbing, LE with chronic stasis dermatitis and RLE with open wound on anterior aspect, no signs of superimposed infection.   Discharge Instructions   Discharge Instructions    (HEART FAILURE PATIENTS) Call MD:  Anytime you have any of the following symptoms: 1) 3 pound weight gain in 24 hours or 5 pounds in 1 week 2) shortness of breath, with or without a dry hacking cough 3) swelling in the hands, feet or stomach 4) if you have to sleep on extra pillows at night in order to breathe.   Complete by: As directed    Diet - low sodium heart healthy   Complete by: As directed    Discharge instructions   Complete by: As directed    Take medications as prescribed Maintain adequate hydration Arrange follow-up with PCP in 10 days Follow low-sodium diet and check your weight on daily basis.     Allergies as of 05/30/2019      Reactions   Penicillins Hives, Itching, Swelling, Rash   Has patient had a PCN reaction causing immediate rash, facial/tongue/throat swelling, SOB or lightheadedness with hypotension: Yes Has patient had a PCN reaction causing severe rash involving mucus membranes or skin necrosis: Yes Has patient had a PCN reaction that required hospitalization: unknown Has patient had a PCN reaction occurring within the last 10 years: No If all of the above answers are "NO", then may proceed with Cephalosporin use. Throat swelling      Medication List    STOP taking these medications   dextromethorphan-guaiFENesin 30-600 MG 12hr tablet Commonly known as: [place] DM     TAKE these medications   allopurinol 100 MG tablet Commonly known as: ZYLOPRIM Take 100 mg by mouth daily.   ALPRAZolam 0.5 MG tablet Commonly  known as: XANAX Take 0.5 mg by mouth 2 (two) times a day.   bisoprolol 10 MG tablet Commonly known as: ZEBETA Take 10 mg by mouth daily.   budesonide-formoterol 160-4.5 MCG/ACT inhaler Commonly known as: Symbicort Inhale 2 puffs into the lungs 2 (two) times daily.   cetirizine 10 MG tablet Commonly known as: ZYRTEC Take 10 mg by mouth daily.   Co Q 10 100 MG Caps Take 1 capsule by mouth daily.   dextromethorphan 30 MG/5ML liquid Commonly known as: DELSYM Take 2.5 mLs (15 mg total) by mouth 2 (two) times daily.   diltiazem 120 MG 24 hr capsule Commonly known as: CARDIZEM CD Take 1 capsule (120 mg total) by mouth daily.   furosemide 80 MG tablet Commonly known as: LASIX Take 80-120 mg by mouth See admin instructions. Takes 120 mg in the am, 80 mg in the pm   hydrOXYzine 10 MG tablet Commonly known as: ATARAX/VISTARIL Take 1 tablet (10 mg total) by mouth every 8 (eight) hours as needed for itching.   Ipratropium-Albuterol 20-100 MCG/ACT Aers respimat Commonly known as: COMBIVENT Inhale 1-2 puffs into the lungs every 6 (six) hours as needed for wheezing or shortness of breath.   levothyroxine 50 MCG tablet Commonly known as: SYNTHROID Take 50 mcg by mouth daily before breakfast.   magnesium oxide 400 MG tablet Commonly  known as: MAG-OX Take 400 mg by mouth daily.   metolazone 2.5 MG tablet Commonly known as: ZAROXOLYN Take 1 tablet (2.5 mg total) by mouth as directed. May take 30 minutes before Lasix if you gain more than 3 lbs in 24 hours   multivitamin with minerals Tabs tablet Take 1 tablet by mouth daily.   omeprazole 20 MG capsule Commonly known as: PRILOSEC Take 1 capsule (20 mg total) by mouth 2 (two) times daily before a meal. What changed: when to take this   OXYGEN Inhale 2 L into the lungs daily.   potassium chloride SA 20 MEQ tablet Commonly known as: K-DUR Take 2 tablets (40 mEq total) by mouth 2 (two) times daily. What changed:   how much to  take  when to take this   rOPINIRole 3 MG tablet Commonly known as: REQUIP Take 3 mg by mouth daily.   warfarin 2 MG tablet Commonly known as: COUMADIN Take as directed. If you are unsure how to take this medication, talk to your nurse or doctor. Original instructions: Take 1.5-2 tablets (3-4 mg total) by mouth See admin instructions. 4mg  on Tuesday and 3 mg daily the rest of the week.      Allergies  Allergen Reactions  . Penicillins Hives, Itching, Swelling and Rash    Has patient had a PCN reaction causing immediate rash, facial/tongue/throat swelling, SOB or lightheadedness with hypotension: Yes Has patient had a PCN reaction causing severe rash involving mucus membranes or skin necrosis: Yes Has patient had a PCN reaction that required hospitalization: unknown Has patient had a PCN reaction occurring within the last 10 years: No If all of the above answers are "NO", then may proceed with Cephalosporin use.    Throat swelling    The results of significant diagnostics from this hospitalization (including imaging, microbiology, ancillary and laboratory) are listed below for reference.    Significant Diagnostic Studies: Dg Chest 1 View  Result Date: 05/28/2019 CLINICAL DATA:  Shortness of breath EXAM: CHEST  1 VIEW COMPARISON:  May 05, 2019. FINDINGS: The heart size is significantly enlarged. The patient is status post prior median sternotomy. There is vascular congestion. No acute osseous abnormality. No pneumothorax. There may be trace bilateral pleural effusions. IMPRESSION: Cardiomegaly with vascular congestion and possible early pulmonary edema. Electronically Signed   By: Constance Holster M.D.   On: 05/28/2019 22:16   Dg Chest Portable 1 View  Result Date: 05/05/2019 CLINICAL DATA:  Productive cough and shortness-of-breath 2 days. EXAM: PORTABLE CHEST 1 VIEW COMPARISON:  04/07/2019 FINDINGS: Sternotomy wires unchanged. Lungs are adequately inflated with no focal lobar  consolidation or effusion. There is mild hazy prominence of the central pulmonary vessels unchanged likely mild vascular congestion. Stable cardiomegaly. Remainder the exam is unchanged. IMPRESSION: Cardiomegaly with suggestion of mild stable vascular congestion. Electronically Signed   By: Marin Olp M.D.   On: 05/05/2019 12:16    Microbiology: Recent Results (from the past 240 hour(s))  SARS Coronavirus 2 [facility] order, Performed in [facility] hospital lab) Nasopharyngeal Nasopharyngeal Swab     Status: None   Collection Time: 05/28/19 10:49 PM   Specimen: Nasopharyngeal Swab  Result Value Ref Range Status   SARS Coronavirus 2 NEGATIVE NEGATIVE Final    Comment: (NOTE) If result is NEGATIVE SARS-CoV-2 target nucleic acids are NOT DETECTED. The SARS-CoV-2 RNA is generally detectable in upper and lower  respiratory specimens during the acute phase of infection. The lowest  concentration of SARS-CoV-2 viral copies this  assay can detect is 250  copies / mL. A negative result does not preclude SARS-CoV-2 infection  and should not be used as the sole basis for treatment or other  patient management decisions.  A negative result may occur with  improper specimen collection / handling, submission of specimen other  than nasopharyngeal swab, presence of viral mutation(s) within the  areas targeted by this assay, and inadequate number of viral copies  (<250 copies / mL). A negative result must be combined with clinical  observations, patient history, and epidemiological information. If result is POSITIVE SARS-CoV-2 target nucleic acids are DETECTED. The SARS-CoV-2 RNA is generally detectable in upper and lower  respiratory specimens dur ing the acute phase of infection.  Positive  results are indicative of active infection with SARS-CoV-2.  Clinical  correlation with patient history and other diagnostic information is  necessary to determine patient infection status.  Positive results do   not rule out bacterial infection or co-infection with other viruses. If result is PRESUMPTIVE POSTIVE SARS-CoV-2 nucleic acids MAY BE PRESENT.   A presumptive positive result was obtained on the submitted specimen  and confirmed on repeat testing.  While 2019 novel coronavirus  (SARS-CoV-2) nucleic acids may be present in the submitted sample  additional confirmatory testing may be necessary for epidemiological  and / or clinical management purposes  to differentiate between  SARS-CoV-2 and other Sarbecovirus currently known to infect humans.  If clinically indicated additional testing with an alternate test  methodology [id number]) is advised. The SARS-CoV-2 RNA is generally  detectable in upper and lower respiratory sp ecimens during the acute  phase of infection. The expected result is Negative. Fact Sheet for Patients:  StrictlyIdeas.no Fact Sheet for Healthcare Providers: BankingDealers.co.za This test is not yet approved or cleared by the [place] FDA and has been authorized for detection and/or diagnosis of SARS-CoV-2 by FDA under an Emergency Use Authorization (EUA).  This EUA will remain in effect (meaning this test can be used) for the duration of the COVID-19 declaration under Section 564(b)(1) of the Act, 21 U.S.C. section 360bbb-3(b)(1), unless the authorization is terminated or revoked sooner. Performed at [facility], [address], [place], [place] [address]      Labs: Basic Metabolic Panel: Recent Labs  Lab 05/28/19 2142 05/29/19 0325 05/30/19 0509  NA 140 137 136  K 2.6* 3.1* 3.4*  CL 96* 98 96*  CO2 32 29 28  GLUCOSE 102* 130* 120*  BUN 27* 26* 40*  CREATININE 1.49* 1.47* 1.84*  CALCIUM 8.9 8.4* 8.8*  MG 1.8  --   --    Liver Function Tests: Recent Labs  Lab 05/29/19 0325  AST 30  ALT 18  ALKPHOS 92  BILITOT 0.8  PROT 7.2  ALBUMIN 3.5   CBC: Recent Labs  Lab 05/28/19 2142 05/29/19 0325   WBC 8.4 8.7  NEUTROABS 6.2  --   HGB 8.3* 8.1*  HCT 28.2* 27.1*  MCV 85.5 84.7  PLT 309 267   BNP (last 3 results) Recent Labs    12/28/18 1123 04/07/19 1819 05/28/19 2142  BNP 652.0* 735.0* 653.0*   CBG: Recent Labs  Lab 05/29/19 1350  GLUCAP 109*    Signed:  Barton Dubois MD.  [facility] 05/30/2019, 1:51 PM

## 2019-05-30 NOTE — Progress Notes (Signed)
IV and telemetry removed. D/C instructions reviewed with patient, verbalized understanding. Spouse called to pick up patient. To be transported to private vehicle via wheelchair. Will continue to monitor.

## 2019-06-07 DIAGNOSIS — I10 Essential (primary) hypertension: Secondary | ICD-10-CM | POA: Diagnosis not present

## 2019-06-07 DIAGNOSIS — I501 Left ventricular failure: Secondary | ICD-10-CM | POA: Diagnosis not present

## 2019-06-07 DIAGNOSIS — J9611 Chronic respiratory failure with hypoxia: Secondary | ICD-10-CM | POA: Diagnosis not present

## 2019-06-07 DIAGNOSIS — J441 Chronic obstructive pulmonary disease with (acute) exacerbation: Secondary | ICD-10-CM | POA: Diagnosis not present

## 2019-06-20 DIAGNOSIS — J441 Chronic obstructive pulmonary disease with (acute) exacerbation: Secondary | ICD-10-CM | POA: Diagnosis not present

## 2019-06-20 DIAGNOSIS — Z791 Long term (current) use of non-steroidal anti-inflammatories (NSAID): Secondary | ICD-10-CM | POA: Diagnosis not present

## 2019-06-20 DIAGNOSIS — I5033 Acute on chronic diastolic (congestive) heart failure: Secondary | ICD-10-CM | POA: Diagnosis not present

## 2019-06-28 DIAGNOSIS — R0602 Shortness of breath: Secondary | ICD-10-CM | POA: Diagnosis not present

## 2019-06-28 DIAGNOSIS — Z87891 Personal history of nicotine dependence: Secondary | ICD-10-CM | POA: Diagnosis not present

## 2019-06-28 DIAGNOSIS — M545 Low back pain: Secondary | ICD-10-CM | POA: Diagnosis not present

## 2019-06-28 DIAGNOSIS — M549 Dorsalgia, unspecified: Secondary | ICD-10-CM | POA: Diagnosis not present

## 2019-06-28 DIAGNOSIS — S20229A Contusion of unspecified back wall of thorax, initial encounter: Secondary | ICD-10-CM | POA: Diagnosis not present

## 2019-06-28 DIAGNOSIS — Z880 Allergy status to penicillin: Secondary | ICD-10-CM | POA: Diagnosis not present

## 2019-06-28 DIAGNOSIS — S3992XA Unspecified injury of lower back, initial encounter: Secondary | ICD-10-CM | POA: Diagnosis not present

## 2019-06-28 DIAGNOSIS — Z8249 Family history of ischemic heart disease and other diseases of the circulatory system: Secondary | ICD-10-CM | POA: Diagnosis not present

## 2019-06-28 DIAGNOSIS — S20221A Contusion of right back wall of thorax, initial encounter: Secondary | ICD-10-CM | POA: Diagnosis not present

## 2019-06-28 DIAGNOSIS — W19XXXA Unspecified fall, initial encounter: Secondary | ICD-10-CM | POA: Diagnosis not present

## 2019-06-28 DIAGNOSIS — I10 Essential (primary) hypertension: Secondary | ICD-10-CM | POA: Diagnosis not present

## 2019-06-28 DIAGNOSIS — W010XXA Fall on same level from slipping, tripping and stumbling without subsequent striking against object, initial encounter: Secondary | ICD-10-CM | POA: Diagnosis not present

## 2019-07-12 DIAGNOSIS — M545 Low back pain: Secondary | ICD-10-CM | POA: Diagnosis not present

## 2019-07-12 DIAGNOSIS — W19XXXA Unspecified fall, initial encounter: Secondary | ICD-10-CM | POA: Diagnosis not present

## 2019-07-12 DIAGNOSIS — Z23 Encounter for immunization: Secondary | ICD-10-CM | POA: Diagnosis not present

## 2019-07-13 ENCOUNTER — Encounter (HOSPITAL_COMMUNITY): Payer: Self-pay

## 2019-07-13 ENCOUNTER — Emergency Department (HOSPITAL_COMMUNITY)
Admission: EM | Admit: 2019-07-13 | Discharge: 2019-07-13 | Disposition: A | Discharge status: Home / Self Care | Payer: Medicare HMO | Attending: Emergency Medicine | Admitting: Emergency Medicine

## 2019-07-13 DIAGNOSIS — N183 Chronic kidney disease, stage 3 (moderate): Secondary | ICD-10-CM | POA: Diagnosis not present

## 2019-07-13 DIAGNOSIS — W1830XA Fall on same level, unspecified, initial encounter: Secondary | ICD-10-CM | POA: Diagnosis not present

## 2019-07-13 DIAGNOSIS — W1830XD Fall on same level, unspecified, subsequent encounter: Secondary | ICD-10-CM | POA: Insufficient documentation

## 2019-07-13 DIAGNOSIS — E7800 Pure hypercholesterolemia, unspecified: Secondary | ICD-10-CM | POA: Diagnosis not present

## 2019-07-13 DIAGNOSIS — Z85038 Personal history of other malignant neoplasm of large intestine: Secondary | ICD-10-CM | POA: Diagnosis not present

## 2019-07-13 DIAGNOSIS — S32020A Wedge compression fracture of second lumbar vertebra, initial encounter for closed fracture: Secondary | ICD-10-CM | POA: Diagnosis not present

## 2019-07-13 DIAGNOSIS — Z79899 Other long term (current) drug therapy: Secondary | ICD-10-CM | POA: Insufficient documentation

## 2019-07-13 DIAGNOSIS — M549 Dorsalgia, unspecified: Secondary | ICD-10-CM | POA: Diagnosis not present

## 2019-07-13 DIAGNOSIS — I2510 Atherosclerotic heart disease of native coronary artery without angina pectoris: Secondary | ICD-10-CM | POA: Diagnosis not present

## 2019-07-13 DIAGNOSIS — I4891 Unspecified atrial fibrillation: Secondary | ICD-10-CM | POA: Insufficient documentation

## 2019-07-13 DIAGNOSIS — I130 Hypertensive heart and chronic kidney disease with heart failure and stage 1 through stage 4 chronic kidney disease, or unspecified chronic kidney disease: Secondary | ICD-10-CM | POA: Insufficient documentation

## 2019-07-13 DIAGNOSIS — I5032 Chronic diastolic (congestive) heart failure: Secondary | ICD-10-CM | POA: Insufficient documentation

## 2019-07-13 DIAGNOSIS — Z87891 Personal history of nicotine dependence: Secondary | ICD-10-CM | POA: Insufficient documentation

## 2019-07-13 DIAGNOSIS — W19XXXA Unspecified fall, initial encounter: Secondary | ICD-10-CM | POA: Diagnosis not present

## 2019-07-13 DIAGNOSIS — J449 Chronic obstructive pulmonary disease, unspecified: Secondary | ICD-10-CM | POA: Diagnosis not present

## 2019-07-13 DIAGNOSIS — S32020D Wedge compression fracture of second lumbar vertebra, subsequent encounter for fracture with routine healing: Secondary | ICD-10-CM

## 2019-07-13 DIAGNOSIS — S32029A Unspecified fracture of second lumbar vertebra, initial encounter for closed fracture: Secondary | ICD-10-CM | POA: Diagnosis not present

## 2019-07-13 DIAGNOSIS — Z8249 Family history of ischemic heart disease and other diseases of the circulatory system: Secondary | ICD-10-CM | POA: Diagnosis not present

## 2019-07-13 DIAGNOSIS — K219 Gastro-esophageal reflux disease without esophagitis: Secondary | ICD-10-CM | POA: Diagnosis not present

## 2019-07-13 DIAGNOSIS — M810 Age-related osteoporosis without current pathological fracture: Secondary | ICD-10-CM | POA: Diagnosis not present

## 2019-07-13 DIAGNOSIS — I10 Essential (primary) hypertension: Secondary | ICD-10-CM | POA: Diagnosis not present

## 2019-07-13 DIAGNOSIS — R69 Illness, unspecified: Secondary | ICD-10-CM | POA: Diagnosis not present

## 2019-07-13 DIAGNOSIS — G8911 Acute pain due to trauma: Secondary | ICD-10-CM | POA: Diagnosis present

## 2019-07-13 MED ORDER — HYDROCODONE-ACETAMINOPHEN 5-325 MG PO TABS
1.0000 | ORAL_TABLET | Freq: Once | ORAL | Status: AC
  Administered 2019-07-13: 1 via ORAL
  Filled 2019-07-13: qty 1

## 2019-07-13 NOTE — ED Triage Notes (Signed)
Pt came from [facility] for TSLO brace.

## 2019-07-13 NOTE — ED Provider Notes (Signed)
[facility] EMERGENCY DEPARTMENT Provider Note   CSN: [id number] Arrival date & time: 07/13/19  2040     History   Chief Complaint Chief Complaint  Patient presents with  . Back Pain    HPI [name] is a 68 y.o. female.     68 year old female with extensive past medical history below including atrial fibrillation, aortic valve repair on Coumadin, CHF, asthma, CAD status post CABG who presents with lumbar fracture.  This morning, the patient was at home walking from her kitchen into her living room when she lost her balance and fell, landing on her left side.  She did not hit her head or lose consciousness.  She began having low back pain and initially took a Vicodin.  Later she presented to an outside hospital where she had CT scans that revealed an L2 compression fracture.  She was transferred here for a TLSO brace.  She reports moderate pain in her back.  No leg weakness or new areas of numbness.  No bowel or bladder problems since the injury.  The history is provided by the patient.  Back Pain Associated symptoms: no numbness and no weakness     Past Medical History:  Diagnosis Date  . Acute on chronic diastolic (congestive) heart failure ([place])   . Anxiety disorder   . Aortic stenosis    Status post St. Jude mechanical AVR 2007  . Asthma   . Atrial fibrillation ([place])   . Carcinoid tumor of colon 2007  . Coronary atherosclerosis of native coronary artery    Status post CABG 2007  . GERD (gastroesophageal reflux disease)   . History of colonoscopy 2003   Dr. Gala Romney - normal  . Hyperlipidemia   . Macromastia   . Pedal edema    Bilateral, chronic  . Peptic stricture of esophagus 10/04/2010   GE junction on last EGD by Dr. Gala Romney, benign biopsies  . RLS (restless legs syndrome)   . Schatzki's ring     Patient Active Problem List   Diagnosis Date Noted  . Acute diastolic CHF (congestive heart failure) ([place]) 05/29/2019  . Itching 05/29/2019  .  Acute hypokalemia 05/05/2019  . Acute CHF (congestive heart failure) ([place]) 04/08/2019  . Acute CHF ([place]) 04/07/2019  . Asthma, chronic, unspecified asthma severity, with acute exacerbation 04/07/2019  . Benign essential HTN   . CHF (congestive heart failure) ([place]) 07/26/2018  . Transaminitis 07/23/2018  . Acute on chronic respiratory failure with hypoxia and hypercapnia ([place]) 06/19/2018  . Acute respiratory failure with hypoxia ([place])   . Supratherapeutic INR 05/03/2018  . CHF exacerbation ([place]) 05/02/2018  . Acute metabolic encephalopathy 0000000  . CKD (chronic kidney disease) stage 3, GFR 30-59 ml/min (HCC) 05/02/2018  . Hypothyroidism 05/02/2018  . Generalized weakness 05/02/2018  . Physical deconditioning   . Coronary artery disease of bypass graft of native heart with stable angina pectoris ([place])   . Renal failure (ARF), acute on chronic (HCC)   . Acute on chronic respiratory failure with hypercapnia ([place]) 03/24/2018  . Hypomagnesemia 03/24/2018  . Hypokalemia 03/24/2018  . Hypophosphatemia 03/24/2018  . Atrial fibrillation with RVR ([place]) 03/24/2018  . Elevated troponin 03/24/2018  . Coughing 01/14/2018  . Anemia 01/14/2018  . Hx of adenomatous colonic polyps 01/14/2018  . Acute on chronic diastolic congestive heart failure ([place]) 12/17/2016  . COPD with acute exacerbation ([place]) 12/17/2016  . Bilateral leg edema 07/05/2014  . Encounter for therapeutic drug monitoring 11/15/2013  . Chronic venous  insufficiency 08/26/2011  . Chronic anticoagulation 01/11/2011  . Peptic stricture of esophagus 10/04/2010  . GERD 09/16/2010  . DYSPHAGIA 09/16/2010  . H/O heart valve replacement with mechanical valve 06/05/2009  . Hyperlipidemia 06/04/2009  . Coronary atherosclerosis of native coronary artery 06/04/2009  . Atrial fibrillation, chronic 06/04/2009    Past Surgical History:  Procedure Laterality Date  . ABDOMINAL HYSTERECTOMY    . AORTIC VALVE REPLACEMENT  2007   #25 mm  St. 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 APPENDECTOMY  2007  . BACK SURGERY     lumbar 4 and 5   . BIOPSY  02/05/2012   RMR:Two tongues of salmon-colored epithelium distal esophagus, very short-segment Barrett's s/p bx/Small hiatal hernia, otherwise normal stomach, D1, D2. Status post esophageal dilation. Biopsy showed GERD.  . Breast cyst removed     bilateral  . BREAST REDUCTION SURGERY    . CESAREAN SECTION    . 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 COLONOSCOPY  02/05/2012   [id number] rectum, sigmoid diverticulosis,descending colon polyp , tubular adenoma  . CORONARY ARTERY BYPASS GRAFT     06/2006 - RIMA to RCA, SVG to RCA  . ESOPHAGOGASTRODUODENOSCOPY  10/03/2010   Dr. Gala Romney- schatzki's ring, shoft peptic stricture at GE junction.  . ESOPHAGOGASTRODUODENOSCOPY (EGD) WITH PROPOFOL N/A 02/08/2018   Procedure: ESOPHAGOGASTRODUODENOSCOPY (EGD) WITH PROPOFOL;  Surgeon: Daneil Dolin, MD;  [facility];  Service: Endoscopy;  Laterality: N/A;  8:15am  . FOOT SURGERY     bilateral bunionectomy  . HERNIA REPAIR     with mesh  . LAPAROTOMY  2007   small bowel resection secondary to small bowel obstruction  . MALONEY DILATION  02/05/2012   Procedure: Venia Minks DILATION;  Surgeon: Daneil Dolin, MD;  Location: [facility] ORS;  Service: Endoscopy;  Laterality: N/A;  93mm   . MALONEY DILATION N/A 02/08/2018   Procedure: Venia Minks DILATION;  Surgeon: Daneil Dolin, MD;  Location: [facility];  Service: Endoscopy;  Laterality: N/A;  . Teeth removal       OB History    Gravida  3   Para  3   Term  3   Preterm      AB      Living  1     SAB      TAB      Ectopic      Multiple      Live Births               Home Medications    Prior to Admission medications   Medication Sig Start Date End Date Taking? Authorizing Provider  allopurinol (ZYLOPRIM) 100 MG tablet Take 100 mg by mouth daily.     [provider]  ALPRAZolam Duanne Moron) 0.5 MG tablet Take 0.5 mg by mouth 2 (two) times a day. 03/03/19   [provider]  bisoprolol (ZEBETA) 10 MG tablet Take 10 mg by mouth daily.  10/05/18   [provider]  budesonide-formoterol (SYMBICORT) 160-4.5 MCG/ACT inhaler Inhale 2 puffs into the lungs 2 (two) times daily. 03/31/18   Barton Dubois, MD  cetirizine (ZYRTEC) 10 MG tablet Take 10 mg by mouth daily.      [provider]  Coenzyme Q10 (CO Q 10) 100 MG CAPS Take 1 capsule by mouth daily.    [provider]  dextromethorphan (DELSYM) 30 MG/5ML liquid Take 2.5 mLs (15 mg total) by  mouth 2 (two) times daily. 05/30/19   Barton Dubois, MD  diltiazem (CARDIZEM CD) 120 MG 24 hr capsule Take 1 capsule (120 mg total) by mouth daily. 04/01/18   Barton Dubois, MD  furosemide (LASIX) 80 MG tablet Take 80-120 mg by mouth See admin instructions. Takes 120 mg in the am, 80 mg in the pm    [provider]  hydrOXYzine (ATARAX/VISTARIL) 10 MG tablet Take 1 tablet (10 mg total) by mouth every 8 (eight) hours as needed for itching. 05/30/19   Barton Dubois, MD  Ipratropium-Albuterol (COMBIVENT) 20-100 MCG/ACT AERS respimat Inhale 1-2 puffs into the lungs every 6 (six) hours as needed for wheezing or shortness of breath. 09/05/18   Barton Dubois, MD  levothyroxine (SYNTHROID, LEVOTHROID) 50 MCG tablet Take 50 mcg by mouth daily before breakfast.     [provider]  magnesium oxide (MAG-OX) 400 MG tablet Take 400 mg by mouth daily.    [provider]  metolazone (ZAROXOLYN) 2.5 MG tablet Take 1 tablet (2.5 mg total) by mouth as directed. May take 30 minutes before Lasix if you gain more than 3 lbs in 24 hours 05/06/19 06/05/19  Heath Lark D, DO  Multiple Vitamin (MULTIVITAMIN WITH MINERALS) TABS tablet Take 1 tablet by mouth daily.    [provider]  omeprazole (PRILOSEC) 20 MG capsule Take 1 capsule (20 mg total) by mouth 2 (two) times  daily before a meal. 05/30/19   Barton Dubois, MD  OXYGEN Inhale 2 L into the lungs daily.     [provider]  potassium chloride SA (K-DUR) 20 MEQ tablet Take 2 tablets (40 mEq total) by mouth 2 (two) times daily. 05/30/19 08/28/19  Barton Dubois, MD  rOPINIRole (REQUIP) 3 MG tablet Take 3 mg by mouth daily.     [provider]  warfarin (COUMADIN) 2 MG tablet Take 1.5-2 tablets (3-4 mg total) by mouth See admin instructions. 4mg  on Tuesday and 3 mg daily the rest of the week. Patient not taking: Reported on 05/05/2019 09/06/18   Barton Dubois, MD    Family History Family History  Problem Relation Age of Onset  . Stroke Mother   . Cancer Father   . Anesthesia problems Neg Hx   . Hypotension Neg Hx   . Malignant hyperthermia Neg Hx   . Pseudochol deficiency Neg Hx     Social History Social History   Tobacco Use  . Smoking status: Former Smoker    Packs/day: 1.00    Years: 20.00    Pack years: 20.00    Types: Cigarettes    Quit date: 10/20/1988    Years since quitting: 30.7  . Smokeless tobacco: Never Used  Substance Use Topics  . Alcohol use: No    Alcohol/week: 0.0 standard drinks  . Drug use: No     Allergies   Penicillins   Review of Systems Review of Systems  Genitourinary: Negative for difficulty urinating.  Musculoskeletal: Positive for back pain. Negative for gait problem.  Neurological: Negative for weakness and numbness.  All other systems reviewed and are negative.    Physical Exam Updated Vital Signs BP 116/73   Pulse 70   Temp 99 F (37.2 C) (Oral)   Resp 18   SpO2 96%   Physical Exam Vitals signs and nursing note reviewed.  Constitutional:      General: She is not in acute distress.    Appearance: She is well-developed.  HENT:     Head: Normocephalic and  atraumatic.  Eyes:     Conjunctiva/sclera: Conjunctivae normal.  Neck:     Musculoskeletal: Neck supple.  Musculoskeletal:     Comments: No focal spinal tenderness  or left flank tenderness  Skin:    General: Skin is warm and dry.     Findings: No bruising.  Neurological:     Mental Status: She is alert and oriented to person, place, and time.     Sensory: No sensory deficit.     Motor: No weakness.  Psychiatric:        Judgment: Judgment normal.      ED Treatments / Results  Labs (all labs ordered are listed, but only abnormal results are displayed) Labs Reviewed - No data to display  EKG None  Radiology No results found.  Procedures Procedures (including critical care time)  Medications Ordered in ED Medications  HYDROcodone-acetaminophen (NORCO/VICODIN) 5-325 MG per tablet 1 tablet (1 tablet Oral Given 07/13/19 2145)     Initial Impression / Assessment and Plan / ED Course  I have reviewed the triage vital signs and the nursing notes.         Prior to patient's arrival to the ED, I had discussed her transfer with ED physician evaluating her at outside facility.  He stated that she had an L2 compression fracture on CT and his consulting specialist had recommended TLSO brace prior to discharge.  He noted that he had a clinic where she could follow-up but was unable to obtain a TLSO brace from his facility, which was the sole reason for transfer.  She has no neurologic deficits or signs/symptoms of cauda equina on my exam.  Provided with TLSO brace and discussed follow-up with provider she had been referred to by outside facility.  She requested pain medications for home but she already has narcotics at home and I explained that she would have to follow-up with her primary provider for any further medications.  Final Clinical Impressions(s) / ED Diagnoses   Final diagnoses:  Compression fracture of L2 vertebra with routine healing, subsequent encounter    ED Discharge Orders    None       Garen Woolbright, Wenda Overland, MD 07/13/19 2237

## 2019-07-14 NOTE — Progress Notes (Signed)
Orthopedic Tech Progress Note Patient Details:  [name] [birth year] [id number]  Patient ID: [name], female   DOB: 1951/01/31, 68 y.o.   MRN: [id number] [place] [facility] for brace  Karolee Stamps 07/14/2019, 1:44 AM

## 2019-07-19 DIAGNOSIS — R69 Illness, unspecified: Secondary | ICD-10-CM | POA: Diagnosis not present

## 2019-07-19 DIAGNOSIS — I5042 Chronic combined systolic (congestive) and diastolic (congestive) heart failure: Secondary | ICD-10-CM | POA: Diagnosis not present

## 2019-07-19 DIAGNOSIS — I10 Essential (primary) hypertension: Secondary | ICD-10-CM | POA: Diagnosis not present

## 2019-07-19 DIAGNOSIS — R079 Chest pain, unspecified: Secondary | ICD-10-CM | POA: Diagnosis not present

## 2019-07-19 DIAGNOSIS — W19XXXA Unspecified fall, initial encounter: Secondary | ICD-10-CM | POA: Diagnosis not present

## 2019-07-20 DIAGNOSIS — I5033 Acute on chronic diastolic (congestive) heart failure: Secondary | ICD-10-CM | POA: Diagnosis not present

## 2019-07-20 DIAGNOSIS — Z791 Long term (current) use of non-steroidal anti-inflammatories (NSAID): Secondary | ICD-10-CM | POA: Diagnosis not present

## 2019-07-20 DIAGNOSIS — J441 Chronic obstructive pulmonary disease with (acute) exacerbation: Secondary | ICD-10-CM | POA: Diagnosis not present

## 2019-07-28 ENCOUNTER — Telehealth: Payer: Self-pay | Admitting: *Deleted

## 2019-07-28 MED ORDER — FUROSEMIDE 80 MG PO TABS: 80.0000 mg | ORAL_TABLET | ORAL | 1 refills | Status: DC

## 2019-07-28 NOTE — Telephone Encounter (Signed)
Request refill for lasix sent to pharmacy. Advised that refill will be sent

## 2019-08-02 DIAGNOSIS — G2581 Restless legs syndrome: Secondary | ICD-10-CM | POA: Diagnosis not present

## 2019-08-02 DIAGNOSIS — I5042 Chronic combined systolic (congestive) and diastolic (congestive) heart failure: Secondary | ICD-10-CM | POA: Diagnosis not present

## 2019-08-02 DIAGNOSIS — R69 Illness, unspecified: Secondary | ICD-10-CM | POA: Diagnosis not present

## 2019-08-02 DIAGNOSIS — I10 Essential (primary) hypertension: Secondary | ICD-10-CM | POA: Diagnosis not present

## 2019-08-02 DIAGNOSIS — K219 Gastro-esophageal reflux disease without esophagitis: Secondary | ICD-10-CM | POA: Diagnosis not present

## 2019-08-02 DIAGNOSIS — M109 Gout, unspecified: Secondary | ICD-10-CM | POA: Diagnosis not present

## 2019-08-02 DIAGNOSIS — E782 Mixed hyperlipidemia: Secondary | ICD-10-CM | POA: Diagnosis not present

## 2019-08-02 DIAGNOSIS — M5136 Other intervertebral disc degeneration, lumbar region: Secondary | ICD-10-CM | POA: Diagnosis not present

## 2019-08-02 DIAGNOSIS — E039 Hypothyroidism, unspecified: Secondary | ICD-10-CM | POA: Diagnosis not present

## 2019-08-16 ENCOUNTER — Ambulatory Visit: Payer: Medicare HMO | Admitting: Cardiology

## 2019-08-16 NOTE — Progress Notes (Deleted)
Cardiology Office Note  Date: 08/16/2019   ID: Jaquese, Kjar 1951-05-07, MRN [id number]  PCP:  Adaline Sill, NP  Cardiologist:  Rozann Lesches, MD Electrophysiologist:  None   No chief complaint on file.   History of Present Illness: [name] is a 68 y.o. female last assessed via telehealth encounter in April.  She has had subsequent hospitalizations since last assessment for management of acute on chronic diastolic heart failure and electrolyte abnormalities.  She had a follow-up echocardiogram done in June as detailed below indicating LVEF 55 to 60% and stable mechanical AVR.  She remains on Coumadin with follow-up in the anticoagulation clinic.  Past Medical History:  Diagnosis Date  . Acute on chronic diastolic (congestive) heart failure ([place])   . Anxiety disorder   . Aortic stenosis    Status post St. Jude mechanical AVR 2007  . Asthma   . Atrial fibrillation ([place])   . Carcinoid tumor of colon 2007  . Coronary atherosclerosis of native coronary artery    Status post CABG 2007  . GERD (gastroesophageal reflux disease)   . History of colonoscopy 2003   Dr. Gala Romney - normal  . Hyperlipidemia   . Macromastia   . Pedal edema    Bilateral, chronic  . Peptic stricture of esophagus 10/04/2010   GE junction on last EGD by Dr. Gala Romney, benign biopsies  . RLS (restless legs syndrome)   . Schatzki's ring     Past Surgical History:  Procedure Laterality Date  . ABDOMINAL HYSTERECTOMY    . AORTIC VALVE REPLACEMENT  2007   #25 mm St. 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 APPENDECTOMY  2007  . BACK SURGERY     lumbar 4 and 5   . BIOPSY  02/05/2012   RMR:Two tongues of salmon-colored epithelium distal esophagus, very short-segment Barrett's s/p bx/Small hiatal hernia, otherwise normal stomach, D1, D2. Status post esophageal dilation. Biopsy showed GERD.  . Breast cyst removed     bilateral  . BREAST  REDUCTION SURGERY    . CESAREAN SECTION    . 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 COLONOSCOPY  02/05/2012   [id number] rectum, sigmoid diverticulosis,descending colon polyp , tubular adenoma  . CORONARY ARTERY BYPASS GRAFT     06/2006 - RIMA to RCA, SVG to RCA  . ESOPHAGOGASTRODUODENOSCOPY  10/03/2010   Dr. Gala Romney- schatzki's ring, shoft peptic stricture at GE junction.  . ESOPHAGOGASTRODUODENOSCOPY (EGD) WITH PROPOFOL N/A 02/08/2018   Procedure: ESOPHAGOGASTRODUODENOSCOPY (EGD) WITH PROPOFOL;  Surgeon: Daneil Dolin, MD;  [facility];  Service: Endoscopy;  Laterality: N/A;  8:15am  . FOOT SURGERY     bilateral bunionectomy  . HERNIA REPAIR     with mesh  . LAPAROTOMY  2007   small bowel resection secondary to small bowel obstruction  . MALONEY DILATION  02/05/2012   Procedure: Venia Minks DILATION;  Surgeon: Daneil Dolin, MD;  Location: [facility];  Service: Endoscopy;  Laterality: N/A;  82mm   . MALONEY DILATION N/A 02/08/2018   Procedure: Venia Minks DILATION;  Surgeon: Daneil Dolin, MD;  Location[facility];  Service: Endoscopy;  Laterality: N/A;  . Teeth removal      Current Outpatient Medications  Medication Sig Dispense Refill  . allopurinol (ZYLOPRIM) 100 MG tablet Take 100 mg by mouth daily.    Marland Kitchen ALPRAZolam (XANAX) 0.5 MG tablet Take 0.5 mg by mouth 2 (  two) times a day.    . bisoprolol (ZEBETA) 10 MG tablet Take 10 mg by mouth daily.     . budesonide-formoterol (SYMBICORT) 160-4.5 MCG/ACT inhaler Inhale 2 puffs into the lungs 2 (two) times daily. 1 Inhaler 3  . cetirizine (ZYRTEC) 10 MG tablet Take 10 mg by mouth daily.      . Coenzyme Q10 (CO Q 10) 100 MG CAPS Take 1 capsule by mouth daily.    Marland Kitchen dextromethorphan (DELSYM) 30 MG/5ML liquid Take 2.5 mLs (15 mg total) by mouth 2 (two) times daily. 89 mL 0  . diltiazem (CARDIZEM CD) 120 MG 24 hr capsule Take 1 capsule (120 mg total) by mouth daily. 30 capsule 2  . furosemide (LASIX) 80 MG tablet  Take 1-1.5 tablets (80-120 mg total) by mouth See admin instructions. Takes 120 mg in the am, 80 mg in the pm 270 tablet 1  . hydrOXYzine (ATARAX/VISTARIL) 10 MG tablet Take 1 tablet (10 mg total) by mouth every 8 (eight) hours as needed for itching. 20 tablet 0  . Ipratropium-Albuterol (COMBIVENT) 20-100 MCG/ACT AERS respimat Inhale 1-2 puffs into the lungs every 6 (six) hours as needed for wheezing or shortness of breath. 1 Inhaler 3  . levothyroxine (SYNTHROID, LEVOTHROID) 50 MCG tablet Take 50 mcg by mouth daily before breakfast.     . magnesium oxide (MAG-OX) 400 MG tablet Take 400 mg by mouth daily.    . metolazone (ZAROXOLYN) 2.5 MG tablet Take 1 tablet (2.5 mg total) by mouth as directed. May take 30 minutes before Lasix if you gain more than 3 lbs in 24 hours 30 tablet 1  . Multiple Vitamin (MULTIVITAMIN WITH MINERALS) TABS tablet Take 1 tablet by mouth daily.    Marland Kitchen omeprazole (PRILOSEC) 20 MG capsule Take 1 capsule (20 mg total) by mouth 2 (two) times daily before a meal. 60 capsule 1  . OXYGEN Inhale 2 L into the lungs daily.     . potassium chloride SA (K-DUR) 20 MEQ tablet Take 2 tablets (40 mEq total) by mouth 2 (two) times daily. 120 tablet 2  . rOPINIRole (REQUIP) 3 MG tablet Take 3 mg by mouth daily.     Marland Kitchen warfarin (COUMADIN) 2 MG tablet Take 1.5-2 tablets (3-4 mg total) by mouth See admin instructions. 4mg  on Tuesday and 3 mg daily the rest of the week. (Patient not taking: Reported on 05/05/2019)     No current facility-administered medications for this visit.    Allergies:  Penicillins   Social History: The patient  reports that she quit smoking about 30 years ago. Her smoking use included cigarettes. She has a 20.00 pack-year smoking history. She has never used smokeless tobacco. She reports that she does not drink alcohol or use drugs.   Family History: The patient's family history includes Cancer in her father; Stroke in her mother.   ROS:  Please see the history of present  illness. Otherwise, complete review of systems is positive for {NONE DEFAULTED:18576::"none"}.  All other systems are reviewed and negative.   Physical Exam: VS:  There were no vitals taken for this visit., BMI There is no height or weight on file to calculate BMI.  Wt Readings from Last 3 Encounters:  05/30/19 128 lb 4.9 oz (58.2 kg)  05/06/19 199 lb 1.2 oz (90.3 kg)  04/11/19 189 lb 9.5 oz (86 kg)    General: Patient appears comfortable at rest. HEENT: Conjunctiva and lids normal, oropharynx clear with moist mucosa. Neck: Supple, no elevated  JVP or carotid bruits, no thyromegaly. Lungs: Clear to auscultation, nonlabored breathing at rest. Cardiac: Regular rate and rhythm, no S3 or significant systolic murmur, no pericardial rub. Abdomen: Soft, nontender, no hepatomegaly, bowel sounds present, no guarding or rebound. Extremities: No pitting edema, distal pulses 2+. Skin: Warm and dry. Musculoskeletal: No kyphosis. Neuropsychiatric: Alert and oriented x3, affect grossly appropriate.  ECG:  An ECG dated 05/28/2019 was personally reviewed today and demonstrated:  Rate controlled atrial fibrillation with diffuse repolarization abnormalities.  Recent Labwork: 04/07/2019: TSH 5.759 05/28/2019: B Natriuretic Peptide 653.0; Magnesium 1.8 05/29/2019: ALT 18; AST 30; Hemoglobin 8.1; Platelets 267 05/30/2019: BUN 40; Creatinine, Ser 1.84; Potassium 3.4; Sodium 136   Other Studies Reviewed Today:  Echocardiogram 04/08/2019:  1. The left ventricle has normal systolic function, with an ejection fraction of 55-60%. The cavity size was normal. Left ventricular diastolic Doppler parameters are indeterminate.  2. The right ventricle has normal systolic function. The cavity was normal. There is no increase in right ventricular wall thickness.  3. Left atrial size was severely dilated.  4. Right atrial size was moderately dilated.  5. The mitral valve is degenerative. Mild thickening of the mitral valve  leaflet. Mild calcification of the mitral valve leaflet. There is moderate mitral annular calcification present.  6. Not well visualized St Jude mechanical AVR no PVL stable gradients since last year.  Assessment and Plan:    Medication Adjustments/Labs and Tests Ordered: Current medicines are reviewed at length with the patient today.  Concerns regarding medicines are outlined above.   Tests Ordered: No orders of the defined types were placed in this encounter.   Medication Changes: No orders of the defined types were placed in this encounter.   Disposition:  Follow up {follow up:15908}  Signed, Satira Sark, MD, [facility] 08/16/2019 9:35 AM    [facility] [place], [place], [place] [address] Phone: [phone]; Fax: [phone]

## 2019-08-20 DIAGNOSIS — J441 Chronic obstructive pulmonary disease with (acute) exacerbation: Secondary | ICD-10-CM | POA: Diagnosis not present

## 2019-08-20 DIAGNOSIS — Z791 Long term (current) use of non-steroidal anti-inflammatories (NSAID): Secondary | ICD-10-CM | POA: Diagnosis not present

## 2019-08-20 DIAGNOSIS — I5033 Acute on chronic diastolic (congestive) heart failure: Secondary | ICD-10-CM | POA: Diagnosis not present

## 2019-08-28 DIAGNOSIS — K219 Gastro-esophageal reflux disease without esophagitis: Secondary | ICD-10-CM | POA: Diagnosis not present

## 2019-08-28 DIAGNOSIS — R040 Epistaxis: Secondary | ICD-10-CM | POA: Diagnosis not present

## 2019-08-28 DIAGNOSIS — Z952 Presence of prosthetic heart valve: Secondary | ICD-10-CM | POA: Diagnosis not present

## 2019-08-28 DIAGNOSIS — E7800 Pure hypercholesterolemia, unspecified: Secondary | ICD-10-CM | POA: Diagnosis not present

## 2019-08-28 DIAGNOSIS — I10 Essential (primary) hypertension: Secondary | ICD-10-CM | POA: Diagnosis not present

## 2019-08-28 DIAGNOSIS — R69 Illness, unspecified: Secondary | ICD-10-CM | POA: Diagnosis not present

## 2019-08-28 DIAGNOSIS — I2510 Atherosclerotic heart disease of native coronary artery without angina pectoris: Secondary | ICD-10-CM | POA: Diagnosis not present

## 2019-08-28 DIAGNOSIS — Z87891 Personal history of nicotine dependence: Secondary | ICD-10-CM | POA: Diagnosis not present

## 2019-08-28 DIAGNOSIS — D649 Anemia, unspecified: Secondary | ICD-10-CM | POA: Diagnosis not present

## 2019-08-28 DIAGNOSIS — E876 Hypokalemia: Secondary | ICD-10-CM | POA: Diagnosis not present

## 2019-09-19 DIAGNOSIS — I5033 Acute on chronic diastolic (congestive) heart failure: Secondary | ICD-10-CM | POA: Diagnosis not present

## 2019-09-19 DIAGNOSIS — Z791 Long term (current) use of non-steroidal anti-inflammatories (NSAID): Secondary | ICD-10-CM | POA: Diagnosis not present

## 2019-09-19 DIAGNOSIS — J441 Chronic obstructive pulmonary disease with (acute) exacerbation: Secondary | ICD-10-CM | POA: Diagnosis not present

## 2019-09-26 DIAGNOSIS — J189 Pneumonia, unspecified organism: Secondary | ICD-10-CM | POA: Diagnosis not present

## 2019-09-27 DIAGNOSIS — R05 Cough: Secondary | ICD-10-CM | POA: Diagnosis not present

## 2019-09-27 DIAGNOSIS — J9811 Atelectasis: Secondary | ICD-10-CM | POA: Diagnosis not present

## 2019-09-27 DIAGNOSIS — I517 Cardiomegaly: Secondary | ICD-10-CM | POA: Diagnosis not present

## 2019-09-27 DIAGNOSIS — J90 Pleural effusion, not elsewhere classified: Secondary | ICD-10-CM | POA: Diagnosis not present

## 2019-09-27 DIAGNOSIS — J189 Pneumonia, unspecified organism: Secondary | ICD-10-CM | POA: Diagnosis not present

## 2019-10-03 DIAGNOSIS — J189 Pneumonia, unspecified organism: Secondary | ICD-10-CM | POA: Diagnosis not present

## 2019-10-03 DIAGNOSIS — I10 Essential (primary) hypertension: Secondary | ICD-10-CM | POA: Diagnosis not present

## 2019-10-03 DIAGNOSIS — I5042 Chronic combined systolic (congestive) and diastolic (congestive) heart failure: Secondary | ICD-10-CM | POA: Diagnosis not present

## 2019-10-20 DIAGNOSIS — J441 Chronic obstructive pulmonary disease with (acute) exacerbation: Secondary | ICD-10-CM | POA: Diagnosis not present

## 2019-10-20 DIAGNOSIS — Z791 Long term (current) use of non-steroidal anti-inflammatories (NSAID): Secondary | ICD-10-CM | POA: Diagnosis not present

## 2019-10-20 DIAGNOSIS — I5033 Acute on chronic diastolic (congestive) heart failure: Secondary | ICD-10-CM | POA: Diagnosis not present

## 2019-12-19 ENCOUNTER — Encounter: Payer: Self-pay | Admitting: Cardiology

## 2019-12-23 ENCOUNTER — Telehealth: Payer: Self-pay | Admitting: Cardiology

## 2019-12-23 NOTE — Telephone Encounter (Signed)
Patient called back asking when she can come in to have her CCR

## 2019-12-26 NOTE — Telephone Encounter (Signed)
Spoke with patient.  INR appt made for 12/28/19 at 3pm.  Pt in agreement.

## 2019-12-28 ENCOUNTER — Other Ambulatory Visit: Payer: Self-pay

## 2019-12-28 ENCOUNTER — Ambulatory Visit (INDEPENDENT_AMBULATORY_CARE_PROVIDER_SITE_OTHER): Payer: Medicare Other | Admitting: *Deleted

## 2019-12-28 DIAGNOSIS — Z952 Presence of prosthetic heart valve: Secondary | ICD-10-CM | POA: Diagnosis not present

## 2019-12-28 DIAGNOSIS — Z5181 Encounter for therapeutic drug level monitoring: Secondary | ICD-10-CM

## 2019-12-28 LAB — POCT INR: INR: 2.1 (ref 2.0–3.0)

## 2019-12-28 NOTE — Patient Instructions (Signed)
Continue coumadin 1 1/2 tablet daily except 2 tablets on Tuesdays and Fridays Recheck in 6 weeks.

## 2020-01-05 ENCOUNTER — Encounter: Payer: Self-pay | Admitting: *Deleted

## 2020-01-05 ENCOUNTER — Telehealth (INDEPENDENT_AMBULATORY_CARE_PROVIDER_SITE_OTHER): Payer: Medicare Other | Admitting: Cardiology

## 2020-01-05 ENCOUNTER — Encounter: Payer: Self-pay | Admitting: Cardiology

## 2020-01-05 DIAGNOSIS — Z952 Presence of prosthetic heart valve: Secondary | ICD-10-CM | POA: Diagnosis not present

## 2020-01-05 DIAGNOSIS — I5032 Chronic diastolic (congestive) heart failure: Secondary | ICD-10-CM | POA: Diagnosis not present

## 2020-01-05 DIAGNOSIS — I2510 Atherosclerotic heart disease of native coronary artery without angina pectoris: Secondary | ICD-10-CM

## 2020-01-05 DIAGNOSIS — I4819 Other persistent atrial fibrillation: Secondary | ICD-10-CM

## 2020-01-05 DIAGNOSIS — N1832 Chronic kidney disease, stage 3b: Secondary | ICD-10-CM | POA: Diagnosis not present

## 2020-01-05 DIAGNOSIS — Z951 Presence of aortocoronary bypass graft: Secondary | ICD-10-CM

## 2020-01-05 NOTE — Progress Notes (Signed)
Virtual Visit via Telephone Note   This visit type was conducted due to national recommendations for restrictions regarding the COVID-19 Pandemic (e.g. social distancing) in an effort to limit this patient's exposure and mitigate transmission in our community.  Due to her co-morbid illnesses, this patient is at least at moderate risk for complications without adequate follow up.  This format is felt to be most appropriate for this patient at this time.  The patient did not have access to video technology/had technical difficulties with video requiring transitioning to audio format only (telephone).  All issues noted in this document were discussed and addressed.  No physical exam could be performed with this format.  Please refer to the patient's chart for her  consent to telehealth for [facility].   The patient was identified using 2 identifiers.  Date:  01/05/2020   ID:  [name], DOB May 23, 1951, MRN [id number]  Patient Location: Home Provider Location: Office  PCP:  Adaline Sill, NP  Cardiologist:  Rozann Lesches, MD Electrophysiologist:  None   Evaluation Performed:  Follow-Up Visit  Chief Complaint:  Cardiac follow-up  History of Present Illness:    [name] is a 69 y.o. female last assessed via telehealth encounter in April 2020.  We spoke by phone today.  She tells me that she has not experienced any substantial fluid gain and that she has not been in the hospital in the last several months (admission noted in August 2020).  She has not been weighing herself regularly however.  Recorded weight below was from a week ago.  I reviewed her medications, she is currently on Demadex with potassium supplements and also has Zaroxolyn to use as needed for weight gain.  No change in heart rate control regimen with Cardizem CD, and she otherwise remains on Coumadin.  She does not report any bleeding problems.  She remains on Coumadin with follow-up in the  anticoagulation clinic.  Last INR was 2.1 on March 10.  She states that she had lab work with PCP earlier this month.  The patient does not have symptoms concerning for COVID-19 infection (fever, chills, cough, or new shortness of breath).    Past Medical History:  Diagnosis Date  . Anxiety disorder   . Aortic stenosis    Status post St. Jude mechanical AVR 2007  . Asthma   . Atrial fibrillation ([place])   . Carcinoid tumor of colon 2007  . Chronic diastolic heart failure ([place])   . Coronary atherosclerosis of native coronary artery    Status post CABG 2007  . GERD (gastroesophageal reflux disease)   . History of colonoscopy 2003   Dr. Gala Romney - normal  . Hyperlipidemia   . Macromastia   . Peptic stricture of esophagus 10/04/2010   GE junction on last EGD by Dr. Gala Romney, benign biopsies  . RLS (restless legs syndrome)   . Schatzki's ring    Past Surgical History:  Procedure Laterality Date  . ABDOMINAL HYSTERECTOMY    . AORTIC VALVE REPLACEMENT  2007   #25 mm St. 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 APPENDECTOMY  2007  . BACK SURGERY     lumbar 4 and 5   . BIOPSY  02/05/2012   RMR:Two tongues of salmon-colored epithelium distal esophagus, very short-segment Barrett's s/p bx/Small hiatal hernia, otherwise normal stomach, D1, D2. Status post esophageal dilation. Biopsy showed GERD.  . Breast cyst removed     bilateral  .  BREAST REDUCTION SURGERY    . CESAREAN SECTION    . 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 COLONOSCOPY  02/05/2012   [id number] rectum, sigmoid diverticulosis,descending colon polyp , tubular adenoma  . CORONARY ARTERY BYPASS GRAFT     06/2006 - RIMA to RCA, SVG to RCA  . ESOPHAGOGASTRODUODENOSCOPY  10/03/2010   Dr. Gala Romney- schatzki's ring, shoft peptic stricture at GE junction.  . ESOPHAGOGASTRODUODENOSCOPY (EGD) WITH PROPOFOL N/A 02/08/2018   Procedure: ESOPHAGOGASTRODUODENOSCOPY (EGD) WITH  PROPOFOL;  Surgeon: Daneil Dolin, MD;  [facility];  Service: Endoscopy;  Laterality: N/A;  8:15am  . FOOT SURGERY     bilateral bunionectomy  . HERNIA REPAIR     with mesh  . LAPAROTOMY  2007   small bowel resection secondary to small bowel obstruction  . MALONEY DILATION  02/05/2012   Procedure: Venia Minks DILATION;  Surgeon: Daneil Dolin, MD;  Location: [facility];  Service: Endoscopy;  Laterality: N/A;  10mm   . MALONEY DILATION N/A 02/08/2018   Procedure: Venia Minks DILATION;  Surgeon: Daneil Dolin, MD;  Location[facility];  Service: Endoscopy;  Laterality: N/A;  . Teeth removal       Current Meds  Medication Sig  . ALPRAZolam (XANAX) 0.5 MG tablet Take 0.25 mg by mouth 2 (two) times a day.   . bisoprolol (ZEBETA) 10 MG tablet Take 10 mg by mouth daily.   . cetirizine (ZYRTEC) 10 MG tablet Take 10 mg by mouth daily.    . Coenzyme Q10 (CO Q 10) 100 MG CAPS Take 1 capsule by mouth daily.  . cyclobenzaprine (FLEXERIL) 10 MG tablet Take 10 mg by mouth 3 (three) times daily as needed for muscle spasms.  Marland Kitchen diltiazem (CARDIZEM CD) 120 MG 24 hr capsule Take 1 capsule (120 mg total) by mouth daily.  Marland Kitchen levothyroxine (SYNTHROID, LEVOTHROID) 50 MCG tablet Take 50 mcg by mouth daily before breakfast.   . metolazone (ZAROXOLYN) 2.5 MG tablet Take 1 tablet (2.5 mg total) by mouth as directed. May take 30 minutes before Lasix if you gain more than 3 lbs in 24 hours  . Multiple Vitamin (MULTIVITAMIN WITH MINERALS) TABS tablet Take 1 tablet by mouth daily.  Marland Kitchen omeprazole (PRILOSEC) 20 MG capsule Take 1 capsule (20 mg total) by mouth 2 (two) times daily before a meal. (Patient taking differently: Take 20 mg by mouth daily. )  . OXYGEN Inhale 2 L into the lungs daily.   . potassium chloride SA (K-DUR) 20 MEQ tablet Take 2 tablets (40 mEq total) by mouth 2 (two) times daily.  Marland Kitchen rOPINIRole (REQUIP) 3 MG tablet Take 3 mg by mouth daily.   . temazepam (RESTORIL) 15 MG capsule Take 15 mg by  mouth at bedtime as needed for sleep.  Marland Kitchen torsemide (DEMADEX) 100 MG tablet Take 100 mg by mouth daily.  Marland Kitchen warfarin (COUMADIN) 2 MG tablet Take 1.5-2 tablets (3-4 mg total) by mouth See admin instructions. 4mg  on Tuesday and 3 mg daily the rest of the week.     Allergies:   Penicillins   ROS: No sense of palpitations, no syncope.   Prior CV studies:   The following studies were reviewed today:  Echocardiogram 04/08/2019: 1. The left ventricle has normal systolic function, with an ejection  fraction of 55-60%. The cavity size was normal. Left ventricular diastolic  Doppler parameters are indeterminate.  2. The right ventricle has normal systolic function. The cavity was  normal.  There is no increase in right ventricular wall thickness.  3. Left atrial size was severely dilated.  4. Right atrial size was moderately dilated.  5. The mitral valve is degenerative. Mild thickening of the mitral valve  leaflet. Mild calcification of the mitral valve leaflet. There is moderate  mitral annular calcification present.  6. Not well visualized St Jude mechanical AVR no PVL stable gradients  since last year.   Labs/Other Tests and Data Reviewed:    EKG:  An ECG dated 05/28/2019 was personally reviewed today and demonstrated:  Atrial fibrillation with diffuse ST segment changes.  Recent Labs: 04/07/2019: TSH 5.759 05/28/2019: B Natriuretic Peptide 653.0; Magnesium 1.8 05/29/2019: ALT 18; Hemoglobin 8.1; Platelets 267 05/30/2019: BUN 40; Creatinine, Ser 1.84; Potassium 3.4; Sodium 136    Wt Readings from Last 3 Encounters:  01/05/20 156 lb 6.4 oz (70.9 kg)  05/30/19 128 lb 4.9 oz (58.2 kg)  05/06/19 199 lb 1.2 oz (90.3 kg)     Objective:    Vital Signs:  Ht 5\' 8"  (1.727 m)   Wt 156 lb 6.4 oz (70.9 kg)   BMI 23.78 kg/m    Not able to obtain vital signs today. She does state that she has a pulse oximeter with recent measurements 94 to 97% on room air. She states that she has oxygen to  use as needed. Patient spoke in full sentences, not short of breath. No audible wheezing or coughing.  ASSESSMENT & PLAN:    1.  Chronic diastolic heart failure.  Patient reports that she has been stable, although has not been checking weights regularly.  She was most recently 156 pounds.  Continue Demadex with potassium supplements, also as needed Zaroxolyn for weight gain of 2 to 3 pounds in 24 hours.  Requesting recent lab work from PCP.  2.  Persistent atrial fibrillation, documented by last ECG.  She does not report any palpitations.  She is concurrently on Coumadin due to mechanical valve.  Continue Cardizem CD.  3.  St. Jude mechanical prosthesis in aortic position.  She continues on Coumadin, recent INR in therapeutic range.  Echocardiogram from last year showed normal prosthetic function.  4.  Multivessel CAD status post CABG in 2007.  She does not report any active angina at this time.  5.  CKD stage IIIb, last creatinine was 1.84.  Requesting lab work from PCP.   Time:   Today, I have spent 6 minutes with the patient with telehealth technology discussing the above problems.     Medication Adjustments/Labs and Tests Ordered: Current medicines are reviewed at length with the patient today.  Concerns regarding medicines are outlined above.   Tests Ordered: No orders of the defined types were placed in this encounter.   Medication Changes: No orders of the defined types were placed in this encounter.   Follow Up:  In Person 6 months in the [place] office.  Signed, Rozann Lesches, MD  01/05/2020 10:36 AM    [place]

## 2020-01-05 NOTE — Patient Instructions (Addendum)

## 2020-01-08 IMAGING — CR DG CHEST 1V PORT
1 series · 1 of 1 positions shown · non-contrast
Comparison: 11/04/2017

CLINICAL DATA: Mild to CIS, shortness of breath, distress

EXAM:
PORTABLE CHEST 1 VIEW

[portable]
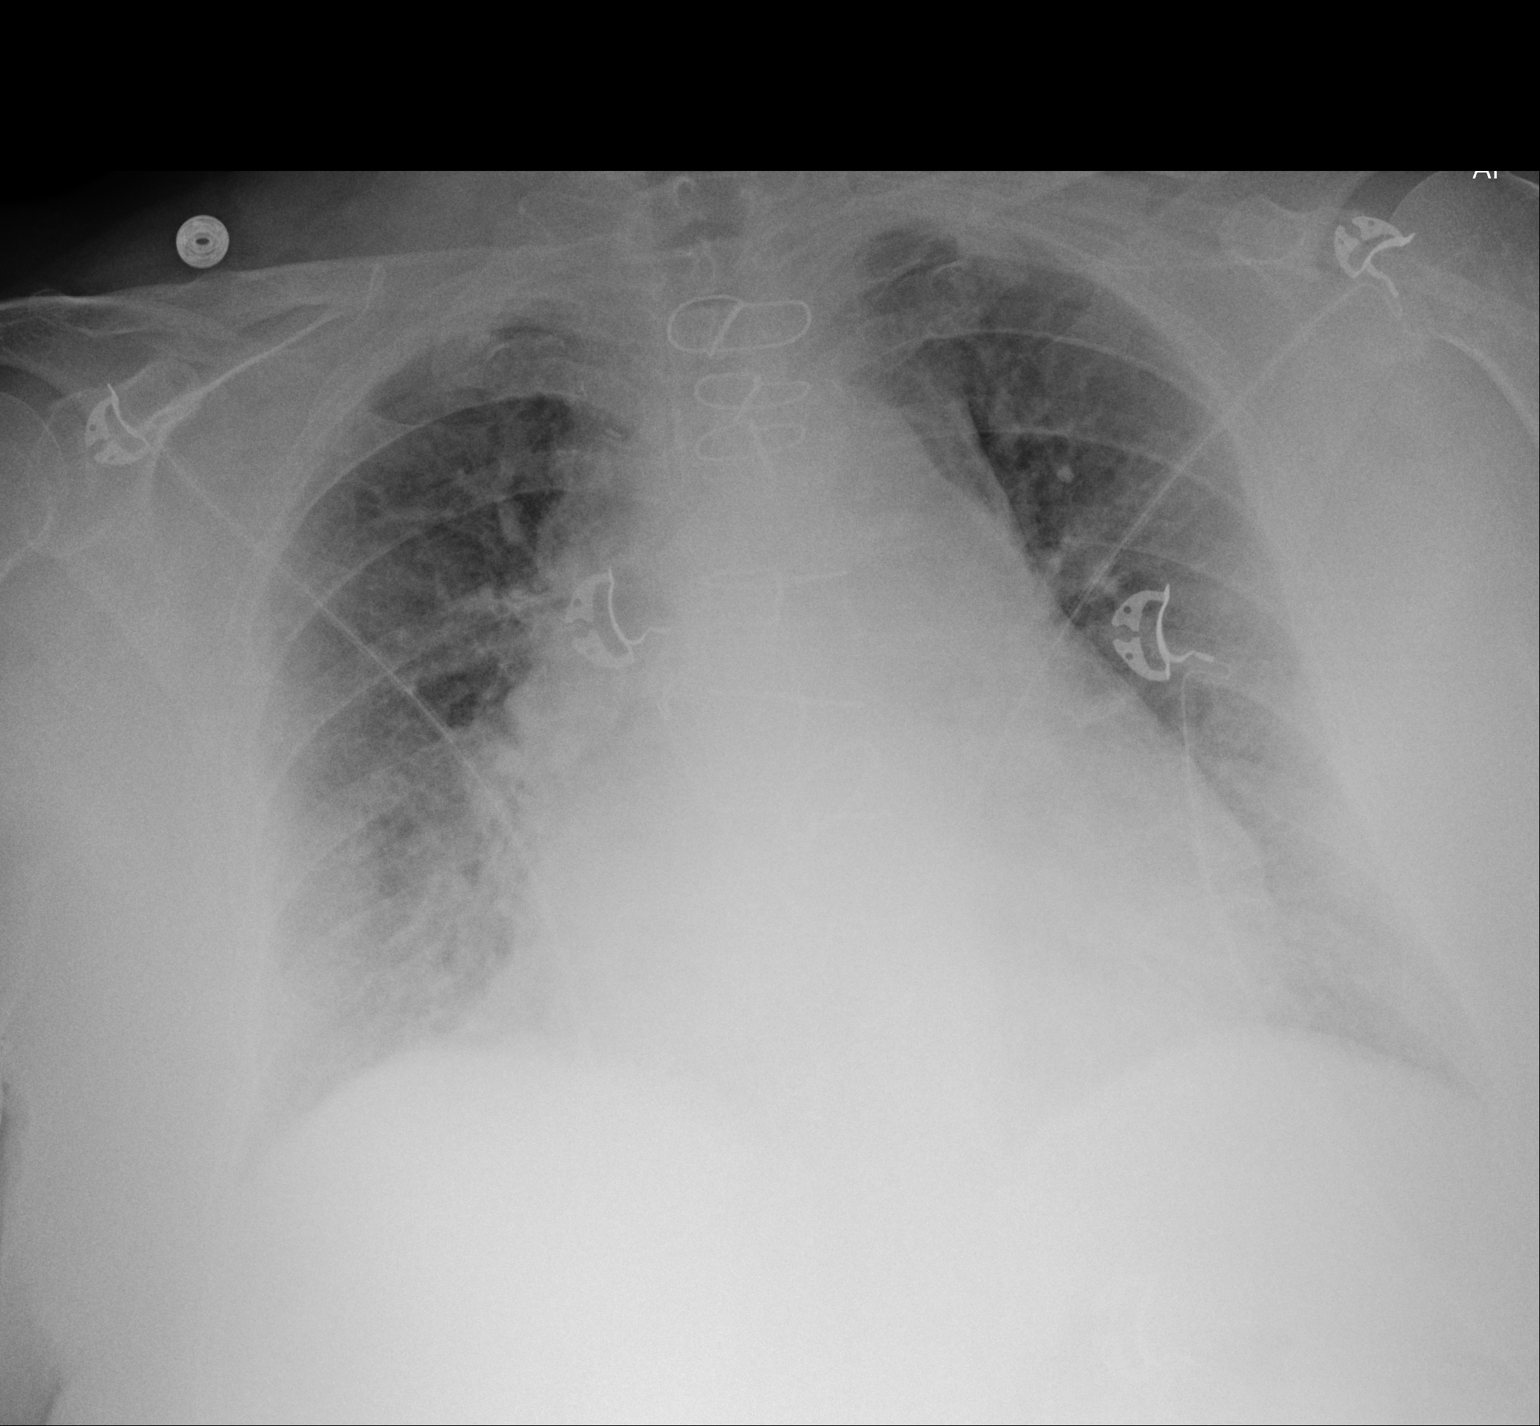

[1 of 1 positions shown; findings below may reference images not displayed]

FINDINGS: Prior CABG and valve replacement. Cardiomegaly with vascular
congestion and diffuse interstitial opacities throughout the lungs,
likely interstitial edema. No effusions or acute bony abnormality.
IMPRESSION: Mild interstitial edema/CHF.

## 2020-01-10 IMAGING — CR DG CHEST 1V PORT
1 series · 1 of 1 positions shown · non-contrast
Comparison: March 24, 2018

CLINICAL DATA: Respiratory failure

EXAM:
PORTABLE CHEST 1 VIEW

[portable]
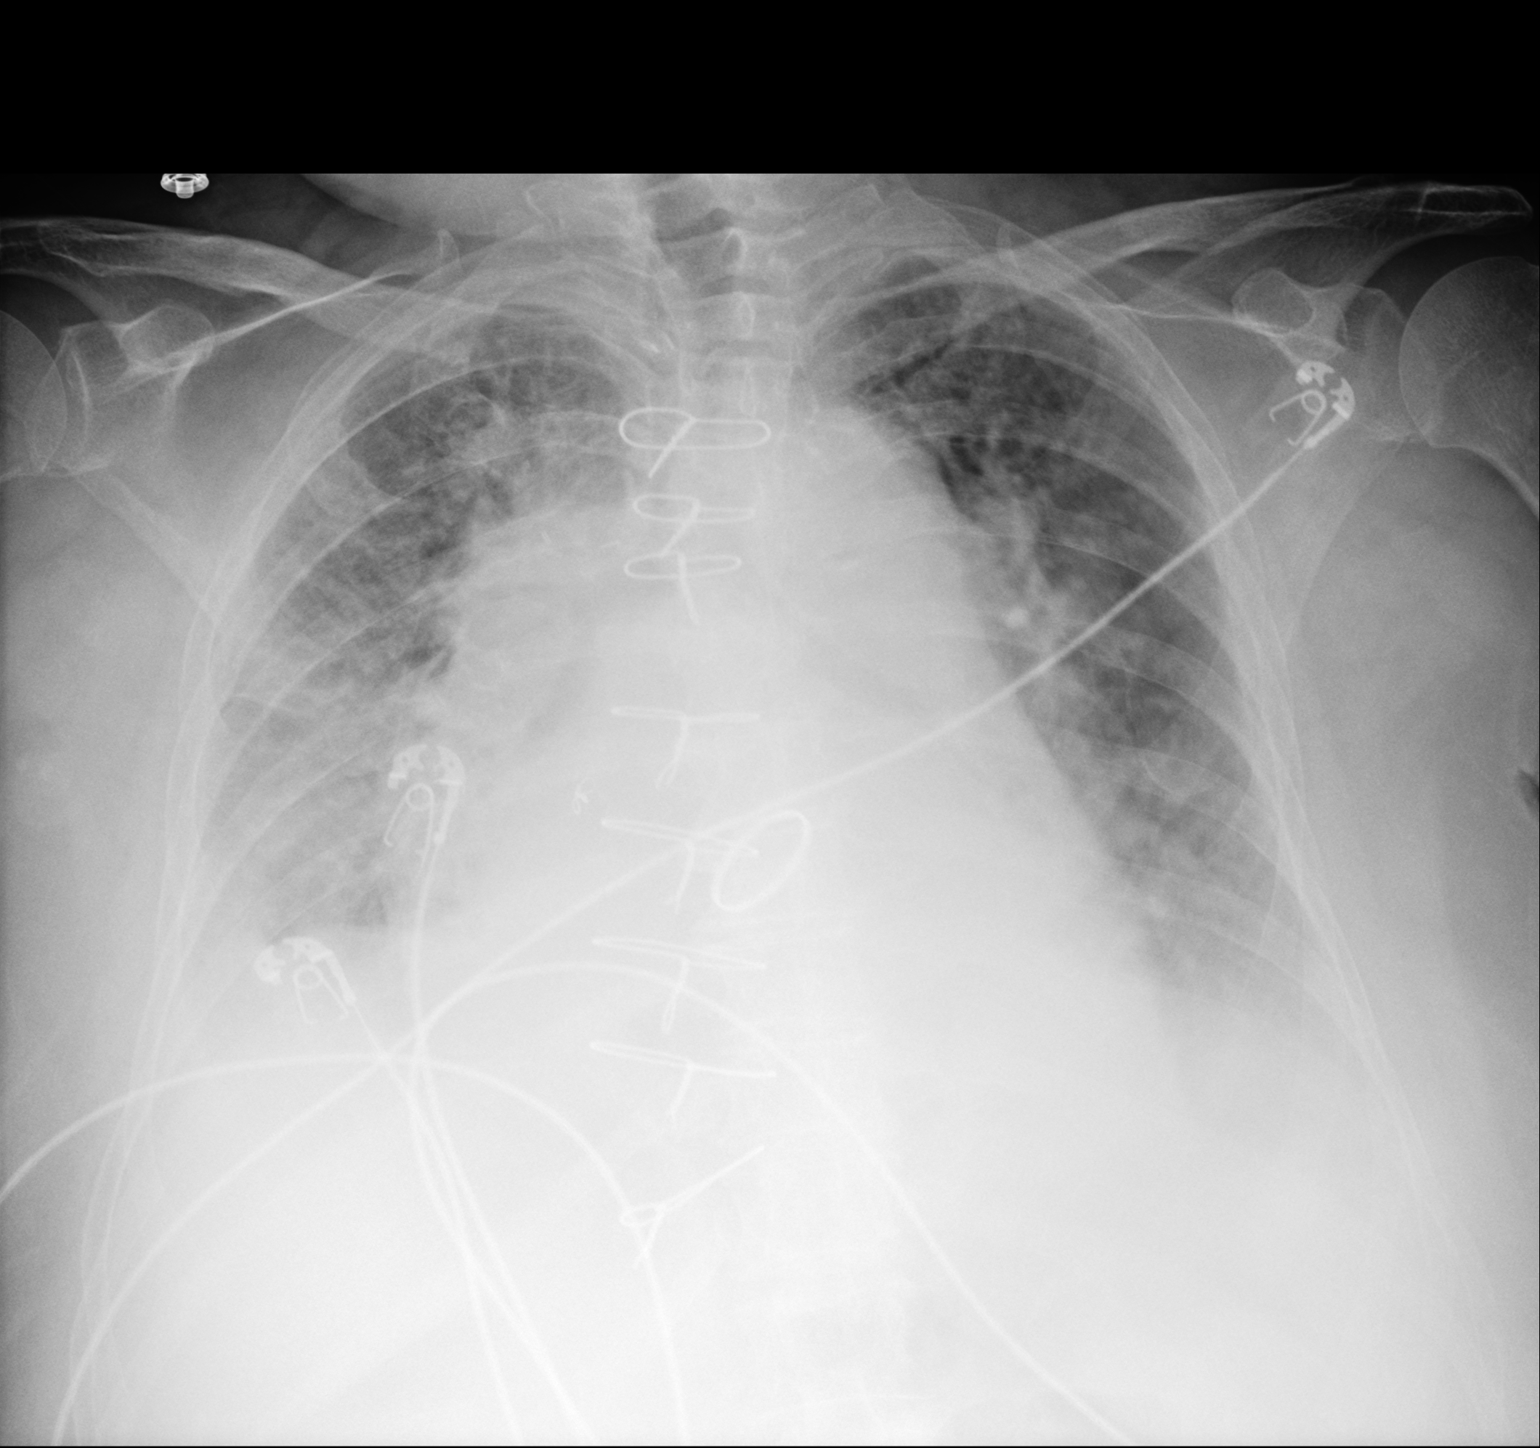

[1 of 1 positions shown; findings below may reference images not displayed]

FINDINGS: There is cardiomegaly with pulmonary vascular congestion and mild
interstitial edema. There are small pleural effusions bilaterally.
There is consolidation in both lower lobes medially.

Patient is status post aortic valve replacement. No evident
adenopathy. No bone lesions. There is aortic atherosclerosis.
IMPRESSION: Pulmonary vascular congestion with suspected congestive heart
failure given interstitial pulmonary edema and pleural effusions
bilaterally. Consolidation in both lower lobes is likely due to
bilateral pneumonia, although there may be a degree of alveolar
edema causing this opacity. Both entities may be present
concurrently.

Patient is status post aortic valve replacement. There is aortic
atherosclerosis.

Aortic Atherosclerosis (F6FF2-VD4.4).

## 2020-02-13 ENCOUNTER — Other Ambulatory Visit: Payer: Self-pay

## 2020-02-13 ENCOUNTER — Ambulatory Visit: Payer: Medicare Other | Admitting: *Deleted

## 2020-02-13 DIAGNOSIS — Z5181 Encounter for therapeutic drug level monitoring: Secondary | ICD-10-CM | POA: Diagnosis not present

## 2020-02-13 DIAGNOSIS — Z952 Presence of prosthetic heart valve: Secondary | ICD-10-CM | POA: Diagnosis not present

## 2020-02-13 DIAGNOSIS — I4891 Unspecified atrial fibrillation: Secondary | ICD-10-CM

## 2020-02-13 LAB — POCT INR: INR: 2.9 (ref 2.0–3.0)

## 2020-02-13 NOTE — Patient Instructions (Signed)
Continue coumadin 1 1/2 tablet daily except 2 tablets on Tuesdays and Fridays Recheck in 6 weeks.

## 2020-03-26 ENCOUNTER — Other Ambulatory Visit: Payer: Self-pay

## 2020-03-26 ENCOUNTER — Ambulatory Visit: Payer: Medicare HMO | Admitting: *Deleted

## 2020-03-26 DIAGNOSIS — Z5181 Encounter for therapeutic drug level monitoring: Secondary | ICD-10-CM

## 2020-03-26 DIAGNOSIS — Z952 Presence of prosthetic heart valve: Secondary | ICD-10-CM

## 2020-03-26 LAB — POCT INR: INR: 3.9 — AB (ref 2.0–3.0)

## 2020-03-26 NOTE — Patient Instructions (Signed)
Hold warfarin tomorrow then decrease dose to 1 1/2 tablet daily  Recheck in 3 weeks.

## 2020-04-16 ENCOUNTER — Ambulatory Visit: Payer: Medicare HMO | Admitting: *Deleted

## 2020-04-16 DIAGNOSIS — Z952 Presence of prosthetic heart valve: Secondary | ICD-10-CM | POA: Diagnosis not present

## 2020-04-16 DIAGNOSIS — Z5181 Encounter for therapeutic drug level monitoring: Secondary | ICD-10-CM | POA: Diagnosis not present

## 2020-04-16 LAB — POCT INR: INR: 3.4 — AB (ref 2.0–3.0)

## 2020-04-16 NOTE — Patient Instructions (Signed)
Hold warfarin tomorrow then decrease dose to 1 1/2 tablet daily except 1 tablet on Mondays and Thursdays Took warfarin this morning Recheck in 3 weeks.

## 2020-05-07 ENCOUNTER — Ambulatory Visit: Payer: Medicare HMO | Admitting: *Deleted

## 2020-05-07 DIAGNOSIS — Z952 Presence of prosthetic heart valve: Secondary | ICD-10-CM

## 2020-05-07 DIAGNOSIS — Z5181 Encounter for therapeutic drug level monitoring: Secondary | ICD-10-CM | POA: Diagnosis not present

## 2020-05-07 LAB — POCT INR: INR: 3.4 — AB (ref 2.0–3.0)

## 2020-05-07 NOTE — Patient Instructions (Signed)
Hold warfarin tomorrow then decrease dose to 1 tablet daily except 1 1/2 tablets on Mondays, Wednesdays and Fridays Took warfarin this morning Recheck in 3 weeks.

## 2020-05-30 ENCOUNTER — Ambulatory Visit: Payer: Medicare HMO | Admitting: *Deleted

## 2020-05-30 ENCOUNTER — Other Ambulatory Visit: Payer: Self-pay

## 2020-05-30 DIAGNOSIS — Z952 Presence of prosthetic heart valve: Secondary | ICD-10-CM | POA: Diagnosis not present

## 2020-05-30 DIAGNOSIS — Z5181 Encounter for therapeutic drug level monitoring: Secondary | ICD-10-CM

## 2020-05-30 LAB — POCT INR: INR: 2 (ref 2.0–3.0)

## 2020-05-30 NOTE — Patient Instructions (Signed)
Continue warfarin 1 tablet daily except 1 1/2 tablets on Mondays, Wednesdays and Fridays Took warfarin this morning Recheck in 4 weeks.

## 2020-06-17 IMAGING — CR DG CHEST 1V PORT
1 series · 1 of 1 positions shown · non-contrast
Comparison: 07/26/2018

CLINICAL DATA: Lower extremity and periorbital edema.

EXAM:
PORTABLE CHEST 1 VIEW

[portable]
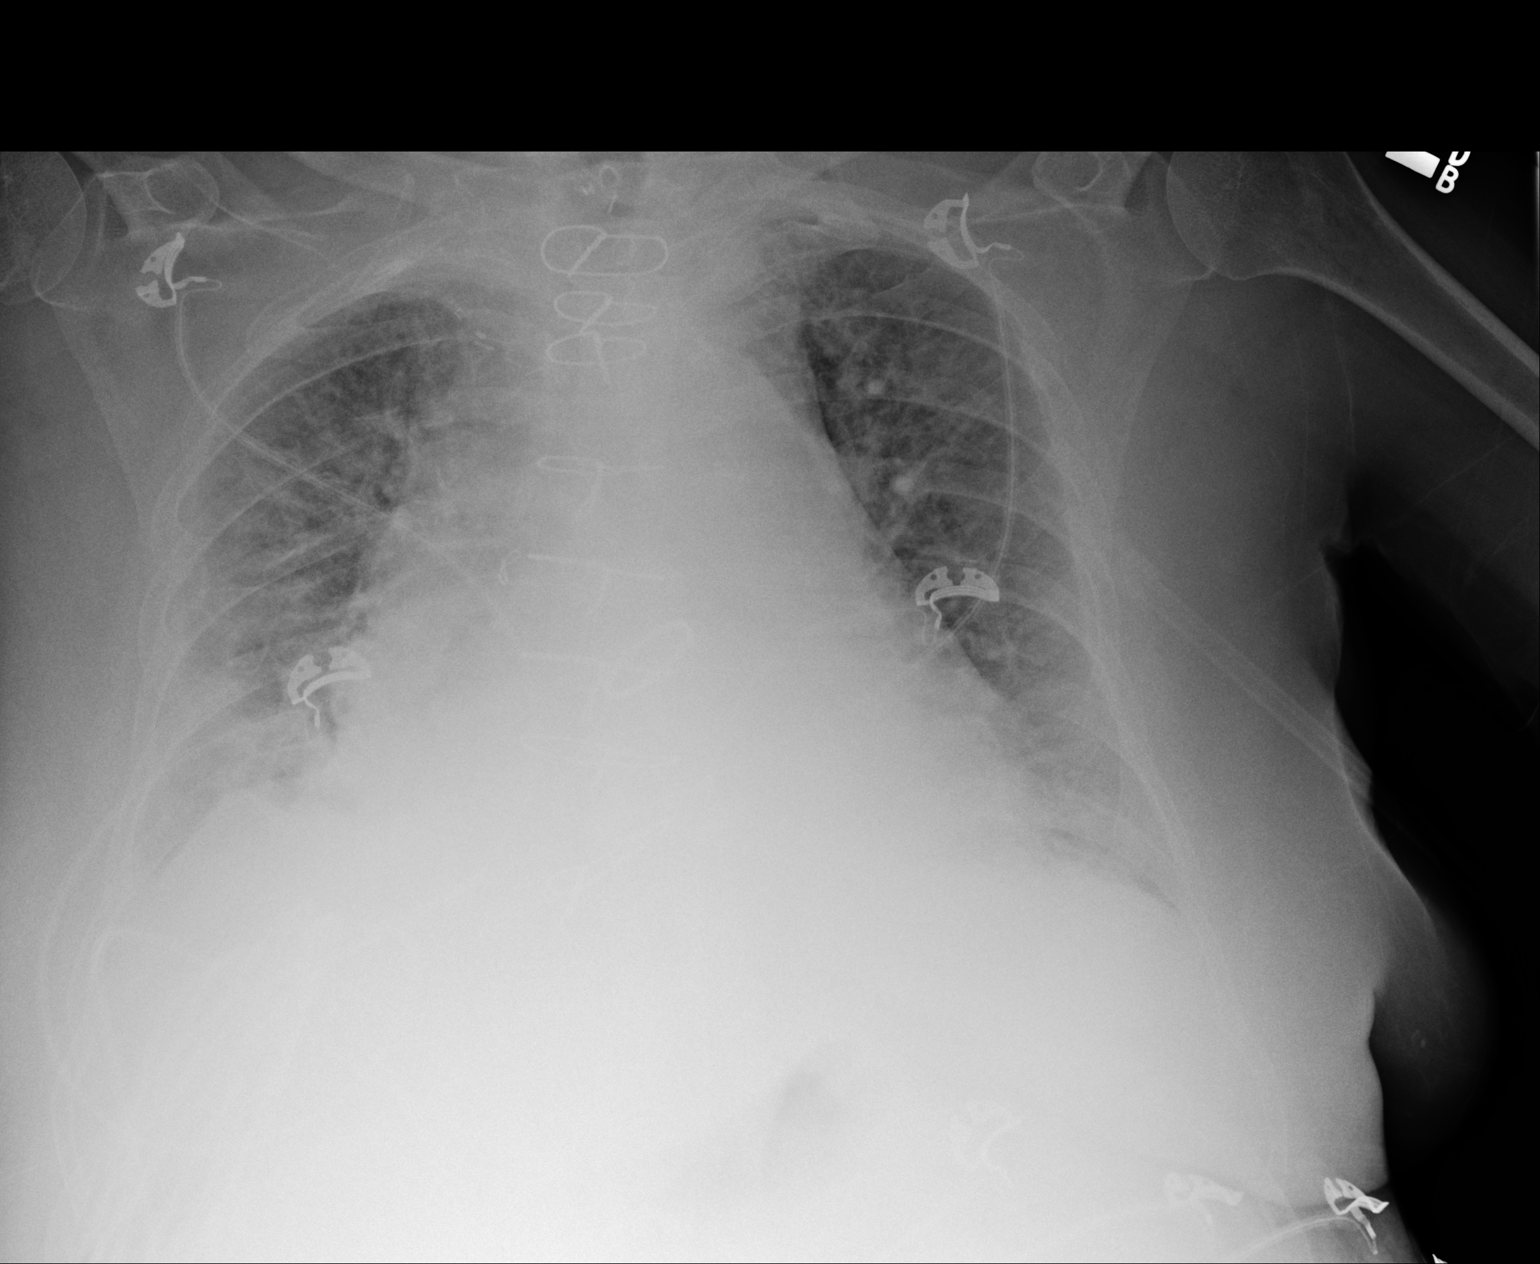

[1 of 1 positions shown; findings below may reference images not displayed]

FINDINGS: Unchanged moderate cardiomegaly with findings of prior median
sternotomy and valve replacement. There is mild interstitial
pulmonary edema. Small right pleural effusion with associated
atelectasis.
IMPRESSION: Cardiomegaly with mild interstitial pulmonary edema and small right
pleural effusion.

## 2020-07-04 ENCOUNTER — Ambulatory Visit: Payer: Medicare HMO | Admitting: *Deleted

## 2020-07-04 ENCOUNTER — Other Ambulatory Visit: Payer: Self-pay

## 2020-07-04 DIAGNOSIS — Z952 Presence of prosthetic heart valve: Secondary | ICD-10-CM | POA: Diagnosis not present

## 2020-07-04 DIAGNOSIS — Z5181 Encounter for therapeutic drug level monitoring: Secondary | ICD-10-CM | POA: Diagnosis not present

## 2020-07-04 LAB — POCT INR: INR: 1.8 — AB (ref 2.0–3.0)

## 2020-07-04 NOTE — Patient Instructions (Signed)
Take warfarin 2 tablets today then increase dose to 1 1/2 tablets daily except 1 tablet on Sundays, Tuesdays and Thursdays Took warfarin this morning Recheck in 3 weeks.

## 2020-07-12 ENCOUNTER — Ambulatory Visit: Payer: Medicare Other | Admitting: Cardiology

## 2020-07-12 NOTE — Progress Notes (Deleted)
Cardiology Office Note  Date: 07/12/2020   ID: [name], [name] 06/03/1951, MRN [id number]  PCP:  Adaline Sill, NP  Cardiologist:  Rozann Lesches, MD Electrophysiologist:  None   No chief complaint on file.   History of Present Illness: [name] is a 69 y.o. female last assessed via telehealth encounter in March.  She is on Coumadin with follow-up in anticoagulation clinic.  Past Medical History:  Diagnosis Date  . Anxiety disorder   . Aortic stenosis    Status post St. Jude mechanical AVR 2007  . Asthma   . Atrial fibrillation ([place])   . Carcinoid tumor of colon 2007  . Chronic diastolic heart failure ([place])   . Coronary atherosclerosis of native coronary artery    Status post CABG 2007  . GERD (gastroesophageal reflux disease)   . History of colonoscopy 2003   Dr. Gala Romney - normal  . Hyperlipidemia   . Macromastia   . Peptic stricture of esophagus 10/04/2010   GE junction on last EGD by Dr. Gala Romney, benign biopsies  . RLS (restless legs syndrome)   . Schatzki's ring     Past Surgical History:  Procedure Laterality Date  . ABDOMINAL HYSTERECTOMY    . AORTIC VALVE REPLACEMENT  2007   #25 mm St. 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 APPENDECTOMY  2007  . BACK SURGERY     lumbar 4 and 5   . BIOPSY  02/05/2012   RMR:Two tongues of salmon-colored epithelium distal esophagus, very short-segment Barrett's s/p bx/Small hiatal hernia, otherwise normal stomach, D1, D2. Status post esophageal dilation. Biopsy showed GERD.  . Breast cyst removed     bilateral  . BREAST REDUCTION SURGERY    . CESAREAN SECTION    . 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 COLONOSCOPY  02/05/2012   BZJ:IRCVEL rectum, sigmoid diverticulosis,descending colon polyp , tubular adenoma  . CORONARY ARTERY BYPASS GRAFT     06/2006 - RIMA to RCA, SVG to RCA  . ESOPHAGOGASTRODUODENOSCOPY  10/03/2010   Dr. Gala Romney-  schatzki's ring, shoft peptic stricture at GE junction.  . ESOPHAGOGASTRODUODENOSCOPY (EGD) WITH PROPOFOL N/A 02/08/2018   Procedure: ESOPHAGOGASTRODUODENOSCOPY (EGD) WITH PROPOFOL;  Surgeon: Daneil Dolin, MD;  [facility];  Service: Endoscopy;  Laterality: N/A;  8:15am  . FOOT SURGERY     bilateral bunionectomy  . HERNIA REPAIR     with mesh  . LAPAROTOMY  2007   small bowel resection secondary to small bowel obstruction  . MALONEY DILATION  02/05/2012   Procedure: Venia Minks DILATION;  Surgeon: Daneil Dolin, MD;  Location: [facility];  Service: Endoscopy;  Laterality: N/A;  71mm   . MALONEY DILATION N/A 02/08/2018   Procedure: Venia Minks DILATION;  Surgeon: Daneil Dolin, MD;  [facility]: [facility];  Service: Endoscopy;  Laterality: N/A;  . Teeth removal      Current Outpatient Medications  Medication Sig Dispense Refill  . ALPRAZolam (XANAX) 0.5 MG tablet Take 0.25 mg by mouth 2 (two) times a day.     . bisoprolol (ZEBETA) 10 MG tablet Take 10 mg by mouth daily.     . cetirizine (ZYRTEC) 10 MG tablet Take 10 mg by mouth daily.      . Coenzyme Q10 (CO Q 10) 100 MG CAPS Take 1 capsule by mouth daily.    . cyclobenzaprine (FLEXERIL) 10 MG tablet Take 10 mg by mouth  3 (three) times daily as needed for muscle spasms.    Marland Kitchen diltiazem (CARDIZEM CD) 120 MG 24 hr capsule Take 1 capsule (120 mg total) by mouth daily. 30 capsule 2  . levothyroxine (SYNTHROID, LEVOTHROID) 50 MCG tablet Take 50 mcg by mouth daily before breakfast.     . metolazone (ZAROXOLYN) 2.5 MG tablet Take 1 tablet (2.5 mg total) by mouth as directed. May take 30 minutes before Lasix if you gain more than 3 lbs in 24 hours 30 tablet 1  . Multiple Vitamin (MULTIVITAMIN WITH MINERALS) TABS tablet Take 1 tablet by mouth daily.    Marland Kitchen omeprazole (PRILOSEC) 20 MG capsule Take 1 capsule (20 mg total) by mouth 2 (two) times daily before a meal. (Patient taking differently: Take 20 mg by mouth daily. ) 60 capsule 1  . OXYGEN  Inhale 2 L into the lungs daily.     . potassium chloride SA (K-DUR) 20 MEQ tablet Take 2 tablets (40 mEq total) by mouth 2 (two) times daily. 120 tablet 2  . rOPINIRole (REQUIP) 3 MG tablet Take 3 mg by mouth daily.     . temazepam (RESTORIL) 15 MG capsule Take 15 mg by mouth at bedtime as needed for sleep.    Marland Kitchen torsemide (DEMADEX) 100 MG tablet Take 100 mg by mouth daily.    Marland Kitchen warfarin (COUMADIN) 2 MG tablet Take 1.5-2 tablets (3-4 mg total) by mouth See admin instructions. 4mg  on Tuesday and 3 mg daily the rest of the week.     No current facility-administered medications for this visit.   Allergies:  Penicillins   Social History: The patient  reports that she quit smoking about 31 years ago. Her smoking use included cigarettes. She has a 20.00 pack-year smoking history. She has never used smokeless tobacco. She reports that she does not drink alcohol and does not use drugs.   Family History: The patient's family history includes Cancer in her father; Stroke in her mother.   ROS:  Please see the history of present illness. Otherwise, complete review of systems is positive for {NONE DEFAULTED:18576::"none"}.  All other systems are reviewed and negative.   Physical Exam: VS:  There were no vitals taken for this visit., BMI There is no height or weight on file to calculate BMI.  Wt Readings from Last 3 Encounters:  01/05/20 156 lb 6.4 oz (70.9 kg)  05/30/19 128 lb 4.9 oz (58.2 kg)  05/06/19 199 lb 1.2 oz (90.3 kg)    General: Patient appears comfortable at rest. HEENT: Conjunctiva and lids normal, oropharynx clear with moist mucosa. Neck: Supple, no elevated JVP or carotid bruits, no thyromegaly. Lungs: Clear to auscultation, nonlabored breathing at rest. Cardiac: Regular rate and rhythm, no S3 or significant systolic murmur, no pericardial rub. Abdomen: Soft, nontender, no hepatomegaly, bowel sounds present, no guarding or rebound. Extremities: No pitting edema, distal pulses  2+. Skin: Warm and dry. Musculoskeletal: No kyphosis. Neuropsychiatric: Alert and oriented x3, affect grossly appropriate.  ECG:  An ECG dated 05/28/2019 was personally reviewed today and demonstrated:  Atrial fibrillation with diffuse ST segment changes.  Recent Labwork:  March 2021: Hemoglobin 10.6, platelets 310, BUN 17, creatinine 1.53, potassium 4.3, AST 31, ALT 16  Other Studies Reviewed Today:  Echocardiogram 04/08/2019: 1. The left ventricle has normal systolic function, with an ejection  fraction of 55-60%. The cavity size was normal. Left ventricular diastolic  Doppler parameters are indeterminate.  2. The right ventricle has normal systolic function. The cavity was  normal. There is no increase in right ventricular wall thickness.  3. Left atrial size was severely dilated.  4. Right atrial size was moderately dilated.  5. The mitral valve is degenerative. Mild thickening of the mitral valve  leaflet. Mild calcification of the mitral valve leaflet. There is moderate  mitral annular calcification present.  6. Not well visualized St Jude mechanical AVR no PVL stable gradients  since last year.   Assessment and Plan:    Medication Adjustments/Labs and Tests Ordered: Current medicines are reviewed at length with the patient today.  Concerns regarding medicines are outlined above.   Tests Ordered: No orders of the defined types were placed in this encounter.   Medication Changes: No orders of the defined types were placed in this encounter.   Disposition:  Follow up {follow up:15908}  Signed, Satira Sark, MD, [facility] 07/12/2020 11:57 AM    [facility], [place], [place] [address] Phone: [phone]; Fax: [phone]

## 2020-07-30 ENCOUNTER — Ambulatory Visit: Payer: Medicare HMO | Admitting: *Deleted

## 2020-07-30 DIAGNOSIS — Z952 Presence of prosthetic heart valve: Secondary | ICD-10-CM

## 2020-07-30 DIAGNOSIS — Z5181 Encounter for therapeutic drug level monitoring: Secondary | ICD-10-CM

## 2020-07-30 LAB — POCT INR: INR: 3.4 — AB (ref 2.0–3.0)

## 2020-07-30 NOTE — Patient Instructions (Signed)
Hold warfarin tomorrow then decrease dose to 1 tablet daily except 1 1/2 tablets on Wednesdays and Saturdays Took warfarin this morning Recheck in 3 weeks.

## 2020-08-02 NOTE — Progress Notes (Signed)
Cardiology Office Note  Date: 08/03/2020   ID: SHAMICA MOREE, DOB 1951/09/29, MRN [id number]  PCP:  Adaline Sill, NP  Cardiologist:  Rozann Lesches, MD Electrophysiologist:  None   Chief Complaint: Follow-up chronic diastolic heart failure  History of Present Illness: JANNEL LYNNE is a 69 y.o. female with a history of chronic diastolic heart failure, aortic stenosis status post Saint Jude mechanical valve replacement 2007, CAD status post CABG 2007, atrial fibrillation, HLD, GERD.  Last encounter with Dr. Domenic Polite 01/05/2020 via telemedicine.  Had not experienced any substantial fluid gain.  Had not been weighing herself regularly.  Medications were reviewed.  She was currently taking torsemide with potassium supplements and Zaroxolyn as needed for weight gain.  Heart rate was controlled on Cardizem CD.  She continued Coumadin and reported no bleeding problems.  She did not report any active angina at that time.  No reported palpitations.  Echo the prior year showed normal prosthetic aortic valve function.  She is here for 6-month follow-up today.  States has been having issues with her sinuses and postnasal drip along with issues with her stomach with some diarrhea recently.  She any recent anginal symptoms, exertional symptoms other than her chronic dyspnea which is stable.  She uses continuous O2.  She has a oxygen concentrator with her which she is not using at the moment.  She denies any significant palpitations or arrhythmias.  No lightheadedness, dizziness, presyncope or syncopal episodes.  No PND or orthopnea.  Denies any bleeding problems.  No claudication-like symptoms, DVT or PE-like symptoms, or lower extremity edema.  It appears she is gained approximately 5 pounds since last visit in March.  Past Medical History:  Diagnosis Date  . Anxiety disorder   . Aortic stenosis    Status post St. Jude mechanical AVR 2007  . Asthma   . Atrial fibrillation ([place])   .  Carcinoid tumor of colon 2007  . Chronic diastolic heart failure ([place])   . Coronary atherosclerosis of native coronary artery    Status post CABG 2007  . GERD (gastroesophageal reflux disease)   . History of colonoscopy 2003   Dr. Gala Romney - normal  . Hyperlipidemia   . Macromastia   . Peptic stricture of esophagus 10/04/2010   GE junction on last EGD by Dr. Gala Romney, benign biopsies  . RLS (restless legs syndrome)   . Schatzki's ring     Past Surgical History:  Procedure Laterality Date  . ABDOMINAL HYSTERECTOMY    . AORTIC VALVE REPLACEMENT  2007   #25 mm St. 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 APPENDECTOMY  2007  . BACK SURGERY     lumbar 4 and 5   . BIOPSY  02/05/2012   RMR:Two tongues of salmon-colored epithelium distal esophagus, very short-segment Barrett's s/p bx/Small hiatal hernia, otherwise normal stomach, D1, D2. Status post esophageal dilation. Biopsy showed GERD.  . Breast cyst removed     bilateral  . BREAST REDUCTION SURGERY    . CESAREAN SECTION    . 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 COLONOSCOPY  02/05/2012   YWV:PXTGGY rectum, sigmoid diverticulosis,descending colon polyp , tubular adenoma  . CORONARY ARTERY BYPASS GRAFT     06/2006 - RIMA to RCA, SVG to RCA  . ESOPHAGOGASTRODUODENOSCOPY  10/03/2010   Dr. Gala Romney- schatzki's ring, shoft peptic stricture at GE junction.  . ESOPHAGOGASTRODUODENOSCOPY (EGD) WITH PROPOFOL N/A 02/08/2018  Procedure: ESOPHAGOGASTRODUODENOSCOPY (EGD) WITH PROPOFOL;  Surgeon: Daneil Dolin, MD;  [facility];  Service: Endoscopy;  Laterality: N/A;  8:15am  . FOOT SURGERY     bilateral bunionectomy  . HERNIA REPAIR     with mesh  . LAPAROTOMY  2007   small bowel resection secondary to small bowel obstruction  . MALONEY DILATION  02/05/2012   Procedure: Venia Minks DILATION;  Surgeon: Daneil Dolin, MD;  Location: [facility];  Service: Endoscopy;  Laterality:  N/A;  37mm   . MALONEY DILATION N/A 02/08/2018   Procedure: Venia Minks DILATION;  Surgeon: Daneil Dolin, MD;  [facility]: [facility];  Service: Endoscopy;  Laterality: N/A;  . Teeth removal      Current Outpatient Medications  Medication Sig Dispense Refill  . ALPRAZolam (XANAX) 0.5 MG tablet Take 0.25 mg by mouth 2 (two) times a day.     . bisoprolol (ZEBETA) 10 MG tablet Take 10 mg by mouth daily.     . cetirizine (ZYRTEC) 10 MG tablet Take 10 mg by mouth daily.      . Coenzyme Q10 (CO Q 10) 100 MG CAPS Take 1 capsule by mouth daily.    . cyclobenzaprine (FLEXERIL) 10 MG tablet Take 10 mg by mouth 3 (three) times daily as needed for muscle spasms.    Marland Kitchen diltiazem (CARDIZEM CD) 120 MG 24 hr capsule Take 1 capsule (120 mg total) by mouth daily. 30 capsule 2  . levothyroxine (SYNTHROID, LEVOTHROID) 50 MCG tablet Take 50 mcg by mouth daily before breakfast.     . Multiple Vitamin (MULTIVITAMIN WITH MINERALS) TABS tablet Take 1 tablet by mouth daily.    Marland Kitchen omeprazole (PRILOSEC) 20 MG capsule Take 1 capsule (20 mg total) by mouth 2 (two) times daily before a meal. (Patient taking differently: Take 20 mg by mouth daily. ) 60 capsule 1  . OXYGEN Inhale 2 L into the lungs daily.     . potassium chloride SA (K-DUR) 20 MEQ tablet Take 2 tablets (40 mEq total) by mouth 2 (two) times daily. 120 tablet 2  . rOPINIRole (REQUIP) 3 MG tablet Take 3 mg by mouth daily.     Marland Kitchen torsemide (DEMADEX) 100 MG tablet Take 100 mg by mouth daily.    . traZODone (DESYREL) 100 MG tablet Take 100 mg by mouth daily.    Marland Kitchen warfarin (COUMADIN) 2 MG tablet Take 1.5-2 tablets (3-4 mg total) by mouth See admin instructions. 4mg  on Tuesday and 3 mg daily the rest of the week.     No current facility-administered medications for this visit.   Allergies:  Penicillins   Social History: The patient  reports that she quit smoking about 31 years ago. Her smoking use included cigarettes. She has a 20.00 pack-year smoking history. She  has never used smokeless tobacco. She reports that she does not drink alcohol and does not use drugs.   Family History: The patient's family history includes Cancer in her father; Stroke in her mother.   ROS:  Please see the history of present illness. Otherwise, complete review of systems is positive for none.  All other systems are reviewed and negative.   Physical Exam: VS:  BP 110/64   Pulse 78   Ht 5\' 8"  (1.727 m)   Wt 161 lb (73 kg)   SpO2 97%   BMI 24.48 kg/m , BMI Body mass index is 24.48 kg/m.  Wt Readings from Last 3 Encounters:  08/03/20 161 lb (73 kg)  01/05/20 156 lb 6.4 oz (70.9 kg)  05/30/19 128 lb 4.9 oz (58.2 kg)    General: Patient appears comfortable at rest. Neck: Supple, no elevated JVP or carotid bruits, no thyromegaly. Lungs: Clear to auscultation, prolonged expiratory phase.  Nonlabored breathing at rest. Cardiac: Regular rate and rhythm, no S3 or significant systolic murmur, no pericardial rub. Extremities: No pitting edema, distal pulses 2+. Skin: Warm and dry. Musculoskeletal: No kyphosis. Neuropsychiatric: Alert and oriented x3, affect grossly appropriate.  ECG:  EKG today shows atrial fibrillation rate of 78.  Diffuse nonspecific T wave abnormality.  Recent Labwork: No results found for requested labs within last 8760 hours.  No results found for: CHOL, TRIG, HDL, CHOLHDL, VLDL, LDLCALC, LDLDIRECT  Other Studies Reviewed Today:  Echocardiogram 04/08/2019 IMPRESSIONS  1. The left ventricle has normal systolic function, with an ejection  fraction of 55-60%. The cavity size was normal. Left ventricular diastolic  Doppler parameters are indeterminate.  2. The right ventricle has normal systolic function. The cavity was  normal. There is no increase in right ventricular wall thickness.  3. Left atrial size was severely dilated.  4. Right atrial size was moderately dilated.  5. The mitral valve is degenerative. Mild thickening of the mitral  valve  leaflet. Mild calcification of the mitral valve leaflet. There is moderate  mitral annular calcification present.  6. Not well visualized St Jude mechanical AVR no PVL stable gradients  since last year.  Assessment and Plan:  1. Chronic diastolic heart failure (HCC)   2. Persistent atrial fibrillation ([place])   3. H/O mechanical aortic valve replacement   4. CAD in native artery   5. Stage 3b chronic kidney disease ([place])    1. Chronic diastolic heart failure (HCC) Denies any significant increase in chronic dyspnea.  She has gained approximately 5 pounds since visit in March.  No lower extremity edema noted.  She does have an audible murmur with no evidence of valve leakage on echo from 04/08/2019.  Please repeat echo 1 month prior to next follow-up.  She is not symptomatic with the murmur.  Continue torsemide 100 mg daily.  Continue bisoprolol 10 mg daily.  2. Persistent atrial fibrillation (HCC) EKG today shows atrial fibrillation with a rate of 78.  She denies any symptoms of palpitations or arrhythmias.  Continue Coumadin as directed, diltiazem 120 mg CD daily.  3. H/O mechanical aortic valve replacement Last echo on 03/2019 [facility] Jude mechanical valve replacement was not well-visualized and had no perivalvular leak.  There is a systolic murmur which may or may not be new.  There was no indication of valvular leak on last echo.  We will get a repeat echo 1 month prior to follow-up in 6 months  4. CAD in native artery Denies any anginal symptoms.  Status post CABG 2007.  5. Stage 3b chronic kidney disease (HCC) Recent creatinine 1.53 and GFR 35.  Medication Adjustments/Labs and Tests Ordered: Current medicines are reviewed at length with the patient today.  Concerns regarding medicines are outlined above.   Disposition: Follow-up with Dr. Domenic Polite or APP 6 months  Signed, Levell July, NP 08/03/2020 1:52 PM    [facility] at [place], [place], [place] [address] Phone: [phone]; Fax: [phone]

## 2020-08-03 ENCOUNTER — Encounter: Payer: Self-pay | Admitting: Family Medicine

## 2020-08-03 ENCOUNTER — Ambulatory Visit (INDEPENDENT_AMBULATORY_CARE_PROVIDER_SITE_OTHER): Payer: Medicare HMO | Admitting: Family Medicine

## 2020-08-03 ENCOUNTER — Other Ambulatory Visit: Payer: Self-pay

## 2020-08-03 ENCOUNTER — Telehealth: Payer: Self-pay | Admitting: *Deleted

## 2020-08-03 VITALS — BP 110/64 | HR 78 | Ht 68.0 in | Wt 161.0 lb

## 2020-08-03 DIAGNOSIS — I5032 Chronic diastolic (congestive) heart failure: Secondary | ICD-10-CM

## 2020-08-03 DIAGNOSIS — N1832 Chronic kidney disease, stage 3b: Secondary | ICD-10-CM

## 2020-08-03 DIAGNOSIS — I4819 Other persistent atrial fibrillation: Secondary | ICD-10-CM

## 2020-08-03 DIAGNOSIS — I2510 Atherosclerotic heart disease of native coronary artery without angina pectoris: Secondary | ICD-10-CM | POA: Diagnosis not present

## 2020-08-03 DIAGNOSIS — Z952 Presence of prosthetic heart valve: Secondary | ICD-10-CM | POA: Diagnosis not present

## 2020-08-03 DIAGNOSIS — R011 Cardiac murmur, unspecified: Secondary | ICD-10-CM

## 2020-08-03 NOTE — Telephone Encounter (Signed)
Lvm on patient main about [name] needing echo for Mur[place] on home number due to main number not being able to leave a message.

## 2020-08-03 NOTE — Addendum Note (Signed)
Addended by: Claude Manges on: 08/03/2020 02:46 PM   Modules accepted: Orders

## 2020-08-03 NOTE — Patient Instructions (Signed)
Medication Instructions:  Your physician recommends that you continue on your current medications as directed. Please refer to the Current Medication list given to you today.  *If you need a refill on your cardiac medications before your next appointment, please call your pharmacy*   Lab Work: [place]   If you have labs (blood work) drawn today and your tests are completely normal, you will receive your results only by: Marland Kitchen MyChart Message (if you have MyChart) OR . A paper copy in the mail If you have any lab test that is abnormal or we need to change your treatment, we will call you to review the results.   Testing/Procedures: NONE ORDERED  TODAY   Follow-Up: At [facility], you and your health needs are our priority.  As part of our continuing mission to provide you with exceptional heart care, we have created designated Provider Care Teams.  These Care Teams include your primary Cardiologist (physician) and Advanced Practice Providers (APPs -  Physician Assistants and Nurse Practitioners) who all work together to provide you with the care you need, when you need it.  We recommend signing up for the patient portal called "MyChart".  Sign up information is provided on this After Visit Summary.  MyChart is used to connect with patients for Virtual Visits (Telemedicine).  Patients are able to view lab/test results, encounter notes, upcoming appointments, etc.  Non-urgent messages can be sent to your provider as well.   To learn more about what you can do with MyChart, go to [email or web address].    Your next appointment:   6 month(s)  The format for your next appointment:   In Person  Provider:   Katina Dung, NP   Other Instructions

## 2020-08-14 ENCOUNTER — Ambulatory Visit (INDEPENDENT_AMBULATORY_CARE_PROVIDER_SITE_OTHER): Payer: Medicare HMO

## 2020-08-14 DIAGNOSIS — R011 Cardiac murmur, unspecified: Secondary | ICD-10-CM | POA: Diagnosis not present

## 2020-08-14 LAB — ECHOCARDIOGRAM COMPLETE
AV Mean grad: 6 mmHg
AV Peak grad: 11.2 mmHg
Ao pk vel: 1.67 m/s
Area-P 1/2: 5.01 cm2
Calc EF: 59.2 %
MV M vel: 3.84 m/s
MV Peak grad: 58.9 mmHg
S' Lateral: 3.07 cm
Single Plane A2C EF: 60.1 %
Single Plane A4C EF: 59.3 %

## 2020-08-15 ENCOUNTER — Telehealth: Payer: Self-pay | Admitting: *Deleted

## 2020-08-15 NOTE — Telephone Encounter (Signed)
Laurine Blazer, LPN  69/48/5462 7:03 AM EDT Back to Top    Voice mail not set up.

## 2020-08-15 NOTE — Telephone Encounter (Signed)
-----   Message from [name]., NP sent at 08/14/2020  1:27 PM EDT ----- Please call the patient and let her know the echocardiogram showed the pumping function of her heart looks good.  Left ventricle is a little more muscular than normal.  The mitral valve has some moderate leaking.  We will just keep an eye on this performing subsequent echocardiogram next year.  Her mechanical valve looks good.

## 2020-08-16 NOTE — Telephone Encounter (Signed)
Laurine Blazer, LPN  58/94/8347 5:83 PM EDT Back to Top    Left message to return call on home number.

## 2020-08-23 ENCOUNTER — Encounter: Payer: Self-pay | Admitting: *Deleted

## 2020-08-23 NOTE — Telephone Encounter (Signed)
Laurine Blazer, LPN  28/12/6627 47:65 AM EDT Back to Top    No answer. Will mail letter today. Copy to pcp.

## 2020-08-27 ENCOUNTER — Telehealth: Payer: Self-pay

## 2020-08-27 NOTE — Telephone Encounter (Signed)
lmom to r/s appt as Amy reid rn is out sick for the coumadin clinic

## 2020-08-30 ENCOUNTER — Other Ambulatory Visit: Payer: Medicare HMO

## 2020-09-04 ENCOUNTER — Ambulatory Visit: Payer: Medicare HMO | Admitting: *Deleted

## 2020-09-04 DIAGNOSIS — Z952 Presence of prosthetic heart valve: Secondary | ICD-10-CM | POA: Diagnosis not present

## 2020-09-04 DIAGNOSIS — Z5181 Encounter for therapeutic drug level monitoring: Secondary | ICD-10-CM | POA: Diagnosis not present

## 2020-09-04 LAB — POCT INR: INR: 1.5 — AB (ref 2.0–3.0)

## 2020-09-04 NOTE — Patient Instructions (Signed)
Take warfarin 2 1/2 tablets today, 2 tablets tomorrow night then resume 1 tablet daily except 1 1/2 tablets on Wednesdays and Saturdays Took warfarin this morning Recheck in 2 weeks.

## 2020-09-18 ENCOUNTER — Ambulatory Visit: Payer: Medicare HMO | Admitting: *Deleted

## 2020-09-18 DIAGNOSIS — Z5181 Encounter for therapeutic drug level monitoring: Secondary | ICD-10-CM

## 2020-09-18 DIAGNOSIS — Z952 Presence of prosthetic heart valve: Secondary | ICD-10-CM

## 2020-09-18 DIAGNOSIS — I4891 Unspecified atrial fibrillation: Secondary | ICD-10-CM | POA: Diagnosis not present

## 2020-09-18 LAB — POCT INR: INR: 1.9 — AB (ref 2.0–3.0)

## 2020-09-18 NOTE — Patient Instructions (Signed)
Increase warfarin to 1 tablet daily except 1 1/2 tablets on Tuesdays, Thursdays and Saturdays Took warfarin this morning Recheck in 3 weeks.

## 2020-10-09 ENCOUNTER — Ambulatory Visit: Payer: Medicare HMO | Admitting: *Deleted

## 2020-10-09 DIAGNOSIS — Z952 Presence of prosthetic heart valve: Secondary | ICD-10-CM | POA: Diagnosis not present

## 2020-10-09 DIAGNOSIS — Z5181 Encounter for therapeutic drug level monitoring: Secondary | ICD-10-CM

## 2020-10-09 LAB — POCT INR: INR: 2.9 (ref 2.0–3.0)

## 2020-10-09 NOTE — Patient Instructions (Signed)
Continue warfarin 1 tablet daily except 1 1/2 tablets on Tuesdays, Thursdays and Saturdays Took warfarin this morning Recheck in 4 weeks.

## 2020-11-14 ENCOUNTER — Ambulatory Visit (INDEPENDENT_AMBULATORY_CARE_PROVIDER_SITE_OTHER): Payer: Medicare HMO | Admitting: *Deleted

## 2020-11-14 ENCOUNTER — Other Ambulatory Visit: Payer: Self-pay

## 2020-11-14 DIAGNOSIS — Z5181 Encounter for therapeutic drug level monitoring: Secondary | ICD-10-CM

## 2020-11-14 DIAGNOSIS — Z952 Presence of prosthetic heart valve: Secondary | ICD-10-CM

## 2020-11-14 LAB — POCT INR: INR: 2.1 (ref 2.0–3.0)

## 2020-11-14 NOTE — Patient Instructions (Signed)
Continue warfarin 1 tablet daily except 1 1/2 tablets on Tuesdays, Thursdays and Saturdays Took warfarin this morning Recheck in 5 weeks.

## 2020-12-19 ENCOUNTER — Ambulatory Visit (INDEPENDENT_AMBULATORY_CARE_PROVIDER_SITE_OTHER): Payer: Medicare HMO | Admitting: *Deleted

## 2020-12-19 DIAGNOSIS — Z952 Presence of prosthetic heart valve: Secondary | ICD-10-CM | POA: Diagnosis not present

## 2020-12-19 DIAGNOSIS — Z5181 Encounter for therapeutic drug level monitoring: Secondary | ICD-10-CM

## 2020-12-19 LAB — POCT INR: INR: 3.1 — AB (ref 2.0–3.0)

## 2020-12-19 NOTE — Patient Instructions (Signed)
Take warfarin 1 tablet tomorrow then resume 1 tablet daily except 1 1/2 tablets on Tuesdays, Thursdays and Saturdays Took warfarin this morning Recheck in 6 weeks.

## 2021-01-30 ENCOUNTER — Ambulatory Visit (INDEPENDENT_AMBULATORY_CARE_PROVIDER_SITE_OTHER): Payer: Medicare HMO | Admitting: *Deleted

## 2021-01-30 ENCOUNTER — Other Ambulatory Visit: Payer: Self-pay

## 2021-01-30 DIAGNOSIS — Z5181 Encounter for therapeutic drug level monitoring: Secondary | ICD-10-CM

## 2021-01-30 DIAGNOSIS — I482 Chronic atrial fibrillation, unspecified: Secondary | ICD-10-CM

## 2021-01-30 DIAGNOSIS — Z952 Presence of prosthetic heart valve: Secondary | ICD-10-CM | POA: Diagnosis not present

## 2021-01-30 LAB — POCT INR: INR: 1.4 — AB (ref 2.0–3.0)

## 2021-01-30 NOTE — Patient Instructions (Signed)
Take warfarin 2 tablets today and tomorrow then resume 1 tablet daily except 1 1/2 tablets on Tuesdays, Thursdays and Saturdays Took warfarin this morning Recheck in 3 weeks.

## 2021-02-06 ENCOUNTER — Encounter: Payer: Self-pay | Admitting: Cardiology

## 2021-02-06 ENCOUNTER — Ambulatory Visit (INDEPENDENT_AMBULATORY_CARE_PROVIDER_SITE_OTHER): Payer: Medicare HMO | Admitting: Cardiology

## 2021-02-06 VITALS — BP 120/64 | HR 68 | Ht 66.0 in | Wt 158.2 lb

## 2021-02-06 DIAGNOSIS — I4821 Permanent atrial fibrillation: Secondary | ICD-10-CM

## 2021-02-06 DIAGNOSIS — Z952 Presence of prosthetic heart valve: Secondary | ICD-10-CM | POA: Diagnosis not present

## 2021-02-06 DIAGNOSIS — I5032 Chronic diastolic (congestive) heart failure: Secondary | ICD-10-CM

## 2021-02-06 DIAGNOSIS — D6869 Other thrombophilia: Secondary | ICD-10-CM

## 2021-02-06 NOTE — Patient Instructions (Addendum)

## 2021-02-06 NOTE — Progress Notes (Signed)
Cardiology Office Note  Date: 02/06/2021   ID: Jaton, Eilers 27-Jan-1951, MRN [id number]  PCP:  Adaline Sill, NP  Cardiologist:  Rozann Lesches, MD Electrophysiologist:  None   Chief Complaint  Patient presents with  . Cardiac follow-up    History of Present Illness: [name] is a 70 y.o. female last seen in October 2021 by Mr. Leonides Sake NP.  She is here for a routine visit.  States that she has been doing well, good control of leg swelling, no significant weight gain.  She continues on Coumadin with follow-up in the anticoagulation clinic.  Last INR 1.4 with adjustments made.  She does not report any spontaneous bleeding problems.  Follow-up echocardiogram in October 2021 revealed LVEF 60 to 65%, moderate RV dysfunction with normal estimated RVSP, severe left atrial enlargement, moderate mitral regurgitation, and stable mechanical aortic prosthesis with mean gradient 6 mmHg and mild aortic regurgitation.  I reviewed her current medications which are outlined below.  She states that she has been compliant.  Also limiting salt in her diet.  Past Medical History:  Diagnosis Date  . Anxiety disorder   . Aortic stenosis    Status post St. Jude mechanical AVR 2007  . Asthma   . Atrial fibrillation ([place])   . Carcinoid tumor of colon 2007  . Chronic diastolic heart failure ([place])   . Coronary atherosclerosis of native coronary artery    Status post CABG 2007  . GERD (gastroesophageal reflux disease)   . History of colonoscopy 2003   Dr. Gala Romney - normal  . Hyperlipidemia   . Macromastia   . Peptic stricture of esophagus 10/04/2010   GE junction on last EGD by Dr. Gala Romney, benign biopsies  . RLS (restless legs syndrome)   . Schatzki's ring     Past Surgical History:  Procedure Laterality Date  . ABDOMINAL HYSTERECTOMY    . AORTIC VALVE REPLACEMENT  2007   #25 mm St. 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  APPENDECTOMY  2007  . BACK SURGERY     lumbar 4 and 5   . BIOPSY  02/05/2012   RMR:Two tongues of salmon-colored epithelium distal esophagus, very short-segment Barrett's s/p bx/Small hiatal hernia, otherwise normal stomach, D1, D2. Status post esophageal dilation. Biopsy showed GERD.  . Breast cyst removed     bilateral  . BREAST REDUCTION SURGERY    . CESAREAN SECTION    . 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 COLONOSCOPY  02/05/2012   HLK:TGYBWL rectum, sigmoid diverticulosis,descending colon polyp , tubular adenoma  . CORONARY ARTERY BYPASS GRAFT     06/2006 - RIMA to RCA, SVG to RCA  . ESOPHAGOGASTRODUODENOSCOPY  10/03/2010   Dr. Gala Romney- schatzki's ring, shoft peptic stricture at GE junction.  . ESOPHAGOGASTRODUODENOSCOPY (EGD) WITH PROPOFOL N/A 02/08/2018   Procedure: ESOPHAGOGASTRODUODENOSCOPY (EGD) WITH PROPOFOL;  Surgeon: Daneil Dolin, MD;  [facility];  Service: Endoscopy;  Laterality: N/A;  8:15am  . FOOT SURGERY     bilateral bunionectomy  . HERNIA REPAIR     with mesh  . LAPAROTOMY  2007   small bowel resection secondary to small bowel obstruction  . MALONEY DILATION  02/05/2012   Procedure: Venia Minks DILATION;  Surgeon: Daneil Dolin, MD;  Location: [facility];  Service: Endoscopy;  Laterality: N/A;  30mm   . MALONEY DILATION N/A 02/08/2018   Procedure: MALONEY DILATION;  Surgeon: Gala Romney,  Cristopher Estimable, MD;  Location: [facility];  Service: Endoscopy;  Laterality: N/A;  . Teeth removal      Current Outpatient Medications  Medication Sig Dispense Refill  . ALPRAZolam (XANAX) 0.5 MG tablet Take 0.25 mg by mouth 2 (two) times a day.     . bisoprolol (ZEBETA) 10 MG tablet Take 10 mg by mouth daily.     . cetirizine (ZYRTEC) 10 MG tablet Take 10 mg by mouth daily.      . Coenzyme Q10 (CO Q 10) 100 MG CAPS Take 1 capsule by mouth daily.    . cyclobenzaprine (FLEXERIL) 10 MG tablet Take 10 mg by mouth 3 (three) times daily as needed for muscle  spasms.    . DAYVIGO 5 MG TABS Take 1 tablet by mouth at bedtime.    Marland Kitchen diltiazem (CARDIZEM CD) 120 MG 24 hr capsule Take 1 capsule (120 mg total) by mouth daily. 30 capsule 2  . FARXIGA 10 MG TABS tablet Take 10 mg by mouth daily.    Marland Kitchen levothyroxine (SYNTHROID, LEVOTHROID) 50 MCG tablet Take 50 mcg by mouth daily before breakfast.     . Multiple Vitamin (MULTIVITAMIN WITH MINERALS) TABS tablet Take 1 tablet by mouth daily.    Marland Kitchen omeprazole (PRILOSEC) 20 MG capsule Take 1 capsule (20 mg total) by mouth 2 (two) times daily before a meal. (Patient taking differently: Take 20 mg by mouth daily.) 60 capsule 1  . OXYGEN Inhale 2 L into the lungs daily.     . potassium chloride SA (KLOR-CON) 20 MEQ tablet Take 40 mEq by mouth 2 (two) times daily.    Marland Kitchen rOPINIRole (REQUIP) 3 MG tablet Take 3 mg by mouth daily.    Marland Kitchen torsemide (DEMADEX) 100 MG tablet Take 100 mg by mouth daily.    . traZODone (DESYREL) 100 MG tablet Take 100 mg by mouth daily.    Marland Kitchen warfarin (COUMADIN) 2 MG tablet Take 1.5-2 tablets (3-4 mg total) by mouth See admin instructions. 4mg  on Tuesday and 3 mg daily the rest of the week.     No current facility-administered medications for this visit.   Allergies:  Penicillins   ROS: Hearing loss.  No palpitations or syncope.  Physical Exam: VS:  BP 120/64   Pulse 68   Ht 5\' 6"  (1.676 m)   Wt 158 lb 3.2 oz (71.8 kg)   SpO2 99%   BMI 25.53 kg/m , BMI Body mass index is 25.53 kg/m.  Wt Readings from Last 3 Encounters:  02/06/21 158 lb 3.2 oz (71.8 kg)  08/03/20 161 lb (73 kg)  01/05/20 156 lb 6.4 oz (70.9 kg)    General: Patient appears comfortable at rest. HEENT: Conjunctiva and lids normal, wearing a mask. Neck: Supple, no elevated JVP or carotid bruits, no thyromegaly. Lungs: Clear to auscultation, nonlabored breathing at rest. Cardiac: Regular rate and rhythm, no S3, 2/6 systolic murmur with mechanical prosthetic click in S2, no pericardial rub. Extremities: Stable venous  stasis changes.  ECG:  An ECG dated 08/03/2020 was personally reviewed today and demonstrated:  Atrial fibrillation with low voltage and nonspecific T wave changes.  Recent Labwork:  March 2021: Hemoglobin 10.6, platelets 310, BUN 17, creatinine 1.53, potassium 4.3, AST 31, ALT 16  Other Studies Reviewed Today:  Echocardiogram 08/14/2020: 1. Left ventricular ejection fraction, by estimation, is 60 to 65%. The  left ventricle has normal function. The left ventricle has no regional  wall motion abnormalities. There is mild left ventricular  hypertrophy.  Left ventricular diastolic parameters  are indeterminate.  2. Right ventricular systolic function is moderately reduced. The right  ventricular size is normal. There is normal pulmonary artery systolic  pressure. The estimated right ventricular systolic pressure is 32.6 mmHg.  3. Left atrial size was severely dilated.  4. Right atrial size was moderately dilated.  5. The mitral valve is abnormal, mildly thickened. There is at least  moderate mitral valve regurgitation, eccentric and directed posteriorly.  Moderate mitral annular calcification.  6. The aortic valve has been repaired/replaced. Aortic valve  regurgitation is mild. There is a 25 mm St. Jude mechanical valve present  in the aortic position. Aortic valve mean gradient measures 6.0 mmHg.  7. The inferior vena cava is normal in size with greater than 50%  respiratory variability, suggesting right atrial pressure of 3 mmHg.   Assessment and Plan:  1.  Chronic diastolic heart failure, also moderate RV dysfunction.  She is doing well in terms of fluid status on current regimen.  Echocardiogram from October 2021 is reviewed above.  Continue Cardizem CD, Demadex, and potassium supplement.  2.  Permanent atrial fibrillation.  CHA2DS2-VASc score is 3-4.  She is on Coumadin.  No palpitations on Cardizem CD.  3.  Acquired thrombophilia, on Coumadin for stroke prophylaxis with  CHA2DS2-VASc score of 3-4.  No reported spontaneous bleeding problems.  She follows in the anticoagulation clinic.  4.  Status post mechanical AVR in 2007, stable function by follow-up echocardiogram in October 2021.  Mild aortic regurgitation noted.  Medication Adjustments/Labs and Tests Ordered: Current medicines are reviewed at length with the patient today.  Concerns regarding medicines are outlined above.   Tests Ordered: No orders of the defined types were placed in this encounter.   Medication Changes: No orders of the defined types were placed in this encounter.   Disposition:  Follow up 6 months.  Signed, Amy Sark, MD, [facility] 02/06/2021 11:53 AM    [facility] [place], [place], [place] [address] Phone: [phone]; Fax: [phone]

## 2021-02-18 IMAGING — CR PORTABLE CHEST - 1 VIEW
1 series · 2 of 2 positions shown · non-contrast
Comparison: 04/07/2019

CLINICAL DATA: Productive cough and shortness-of-breath 2 days.

EXAM:
PORTABLE CHEST 1 VIEW

[Series 1: portable · 0.17mm/px · 2 of 2 slices shown]
[im 1/2]
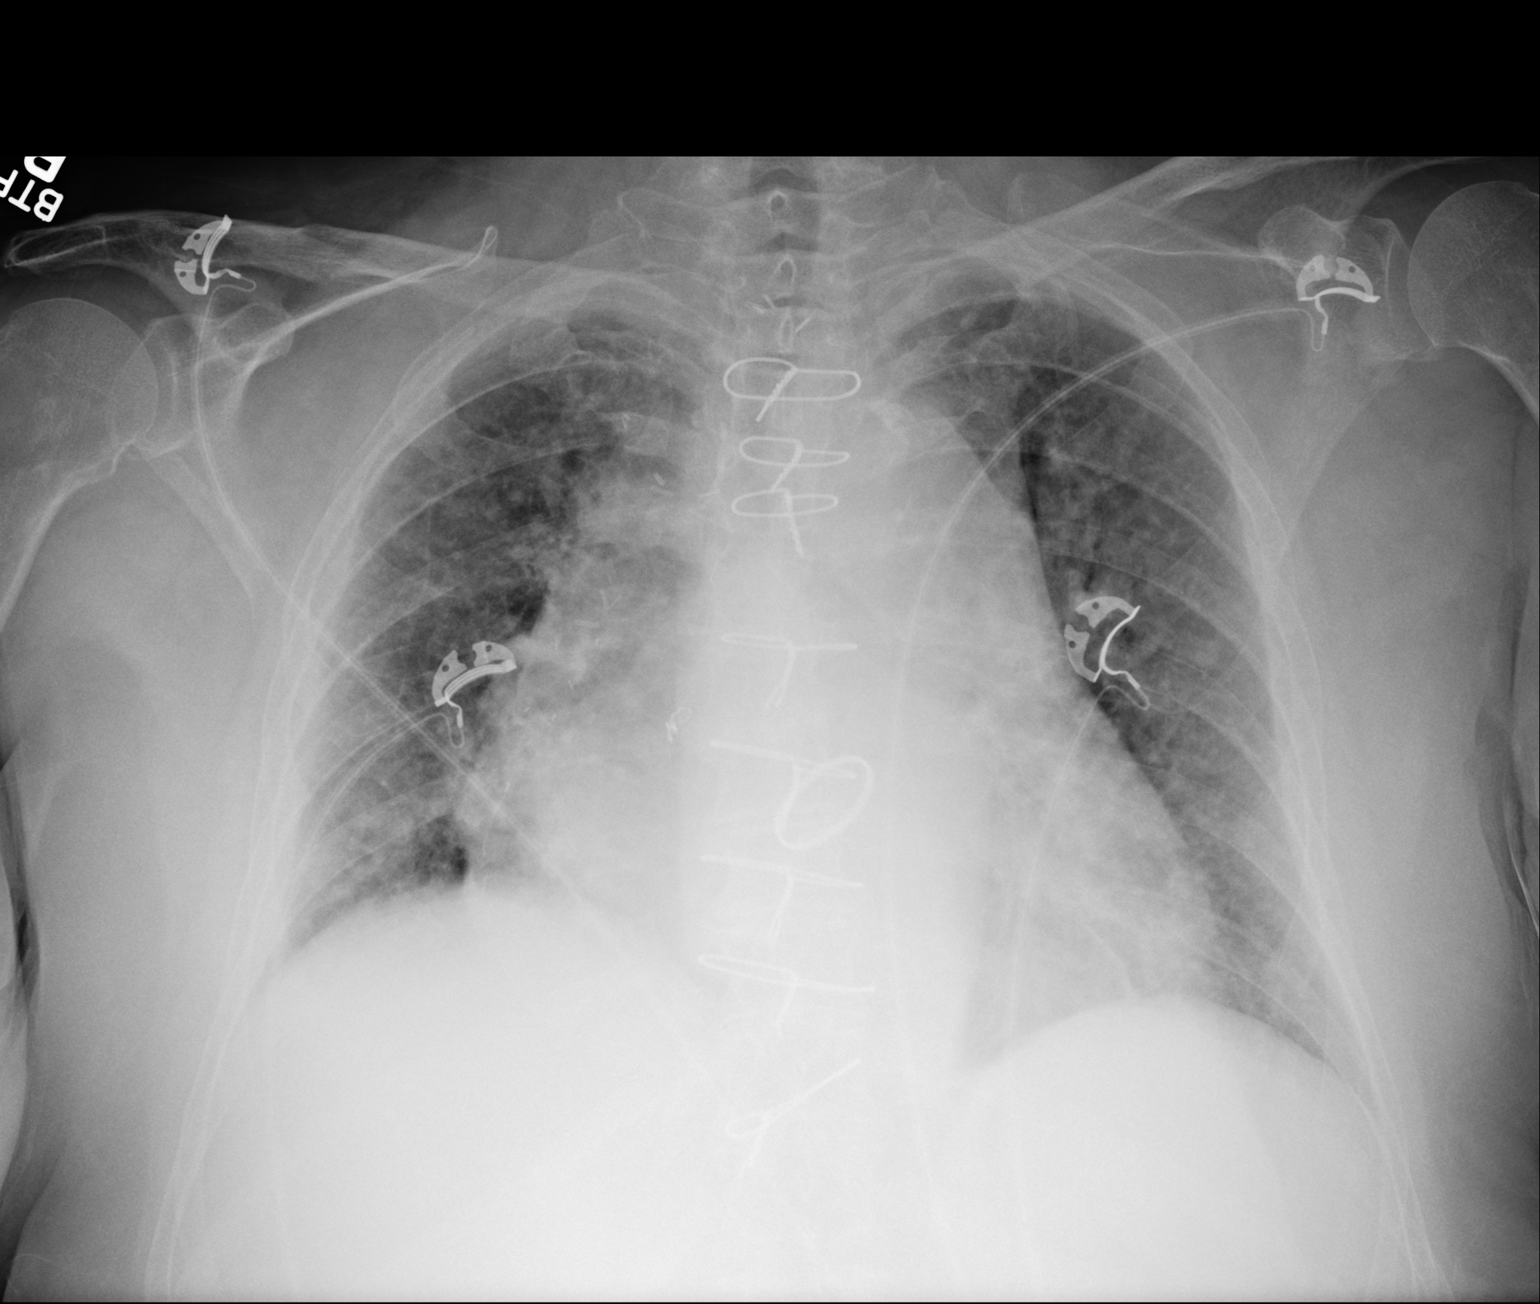
[im 2/2]
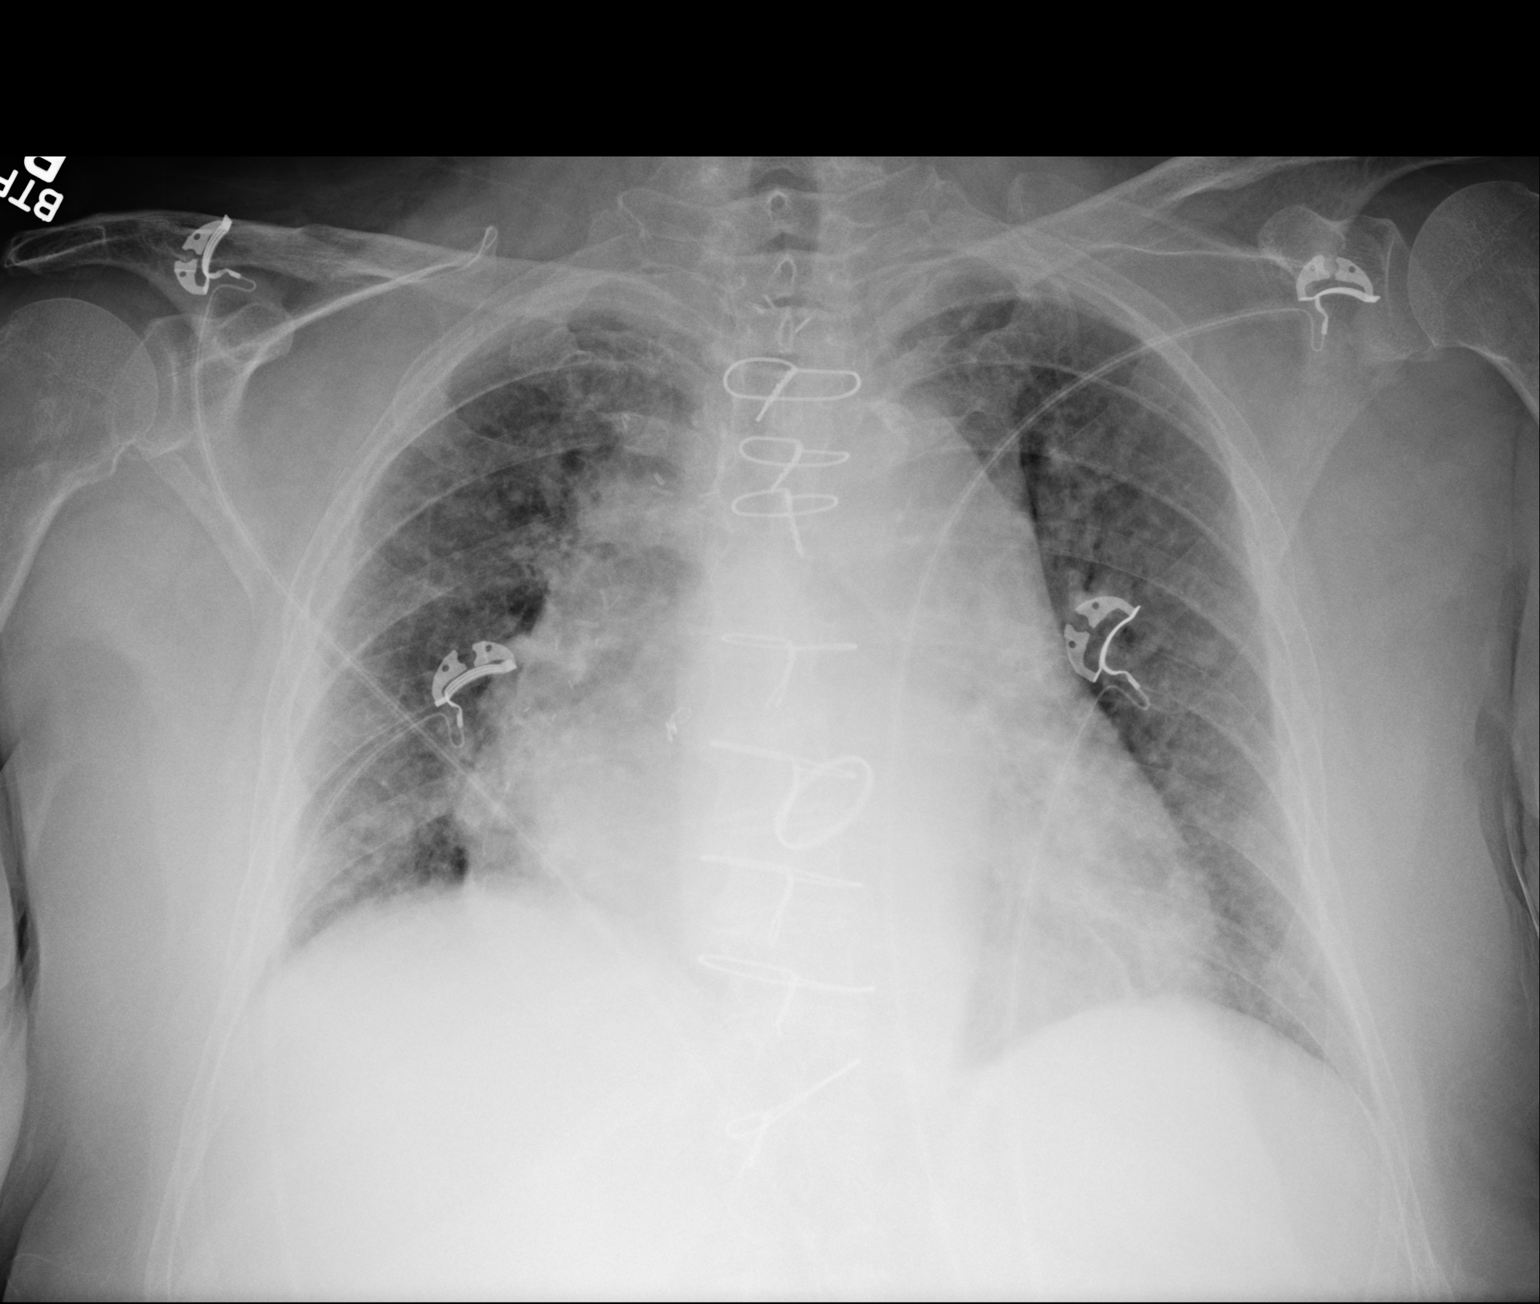

[2 of 2 positions shown; findings below may reference images not displayed]

FINDINGS: Sternotomy wires unchanged. Lungs are adequately inflated with no
focal lobar consolidation or effusion. There is mild hazy prominence
of the central pulmonary vessels unchanged likely mild vascular
congestion. Stable cardiomegaly. Remainder the exam is unchanged.
IMPRESSION: Cardiomegaly with suggestion of mild stable vascular congestion.

## 2021-02-20 ENCOUNTER — Ambulatory Visit (INDEPENDENT_AMBULATORY_CARE_PROVIDER_SITE_OTHER): Payer: Medicare HMO | Admitting: *Deleted

## 2021-02-20 DIAGNOSIS — Z5181 Encounter for therapeutic drug level monitoring: Secondary | ICD-10-CM | POA: Diagnosis not present

## 2021-02-20 DIAGNOSIS — Z952 Presence of prosthetic heart valve: Secondary | ICD-10-CM

## 2021-02-20 LAB — POCT INR: INR: 1.7 — AB (ref 2.0–3.0)

## 2021-02-20 NOTE — Patient Instructions (Signed)
Increase warfarin to 1 1/2 tablets daily except 1 tablet on Sundays Took warfarin this morning Recheck in 3 weeks.

## 2021-03-13 ENCOUNTER — Ambulatory Visit (INDEPENDENT_AMBULATORY_CARE_PROVIDER_SITE_OTHER): Payer: Medicare HMO | Admitting: *Deleted

## 2021-03-13 ENCOUNTER — Other Ambulatory Visit: Payer: Self-pay

## 2021-03-13 DIAGNOSIS — Z952 Presence of prosthetic heart valve: Secondary | ICD-10-CM | POA: Diagnosis not present

## 2021-03-13 DIAGNOSIS — Z5181 Encounter for therapeutic drug level monitoring: Secondary | ICD-10-CM | POA: Diagnosis not present

## 2021-03-13 LAB — POCT INR: INR: 2.5 (ref 2.0–3.0)

## 2021-03-13 NOTE — Patient Instructions (Signed)
Continue warfarin 1 1/2 tablets daily except 1 tablet on Sundays Took warfarin this morning Recheck in 4 weeks.

## 2021-04-10 ENCOUNTER — Other Ambulatory Visit: Payer: Self-pay

## 2021-04-10 ENCOUNTER — Ambulatory Visit (INDEPENDENT_AMBULATORY_CARE_PROVIDER_SITE_OTHER): Payer: Medicare HMO | Admitting: *Deleted

## 2021-04-10 DIAGNOSIS — Z952 Presence of prosthetic heart valve: Secondary | ICD-10-CM | POA: Diagnosis not present

## 2021-04-10 DIAGNOSIS — Z5181 Encounter for therapeutic drug level monitoring: Secondary | ICD-10-CM | POA: Diagnosis not present

## 2021-04-10 DIAGNOSIS — I4891 Unspecified atrial fibrillation: Secondary | ICD-10-CM | POA: Diagnosis not present

## 2021-04-10 LAB — POCT INR: INR: 5.7 — AB (ref 2.0–3.0)

## 2021-04-10 NOTE — Patient Instructions (Signed)
Hold warfarin tomorrow and Friday then decrease dose to 1 tablet daily except 1 1/2 tablets on Sundays, Tuesdays and Thursdays Took warfarin this morning Recheck in 1 week

## 2021-04-17 ENCOUNTER — Ambulatory Visit (INDEPENDENT_AMBULATORY_CARE_PROVIDER_SITE_OTHER): Payer: Medicare HMO | Admitting: *Deleted

## 2021-04-17 DIAGNOSIS — Z5181 Encounter for therapeutic drug level monitoring: Secondary | ICD-10-CM | POA: Diagnosis not present

## 2021-04-17 DIAGNOSIS — Z952 Presence of prosthetic heart valve: Secondary | ICD-10-CM | POA: Diagnosis not present

## 2021-04-17 DIAGNOSIS — I482 Chronic atrial fibrillation, unspecified: Secondary | ICD-10-CM | POA: Diagnosis not present

## 2021-04-17 LAB — POCT INR: INR: 3 (ref 2.0–3.0)

## 2021-04-17 NOTE — Patient Instructions (Signed)
Continue warfarin 1 tablet daily except 1 1/2 tablets on Sundays, Tuesdays and Thursdays Took warfarin this morning Recheck in 3 weeks

## 2021-05-08 ENCOUNTER — Ambulatory Visit: Payer: Medicare HMO | Admitting: *Deleted

## 2021-05-08 DIAGNOSIS — Z952 Presence of prosthetic heart valve: Secondary | ICD-10-CM

## 2021-05-08 DIAGNOSIS — I4891 Unspecified atrial fibrillation: Secondary | ICD-10-CM | POA: Diagnosis not present

## 2021-05-08 DIAGNOSIS — Z5181 Encounter for therapeutic drug level monitoring: Secondary | ICD-10-CM

## 2021-05-08 LAB — POCT INR: INR: 3.4 — AB (ref 2.0–3.0)

## 2021-05-08 NOTE — Patient Instructions (Signed)
Take 1/2 tablet tomorrow then decrease dose to 1 tablet daily except 1 1/2 tablets on Sundays and Thursdays Took warfarin this morning Recheck in 3 weeks

## 2021-06-03 ENCOUNTER — Ambulatory Visit: Payer: Medicare HMO | Admitting: *Deleted

## 2021-06-03 DIAGNOSIS — Z5181 Encounter for therapeutic drug level monitoring: Secondary | ICD-10-CM

## 2021-06-03 DIAGNOSIS — Z952 Presence of prosthetic heart valve: Secondary | ICD-10-CM

## 2021-06-03 LAB — POCT INR: INR: 2.8 (ref 2.0–3.0)

## 2021-06-03 NOTE — Patient Instructions (Signed)
Continue warfarin 1 tablet daily except 1 1/2 tablets on Sundays and Thursdays Recheck in 4 weeks

## 2021-07-01 ENCOUNTER — Ambulatory Visit: Payer: Medicare HMO | Admitting: *Deleted

## 2021-07-01 DIAGNOSIS — Z952 Presence of prosthetic heart valve: Secondary | ICD-10-CM | POA: Diagnosis not present

## 2021-07-01 DIAGNOSIS — I482 Chronic atrial fibrillation, unspecified: Secondary | ICD-10-CM | POA: Diagnosis not present

## 2021-07-01 DIAGNOSIS — Z5181 Encounter for therapeutic drug level monitoring: Secondary | ICD-10-CM

## 2021-07-01 LAB — POCT INR: INR: 3.3 — AB (ref 2.0–3.0)

## 2021-07-01 NOTE — Patient Instructions (Signed)
Hold warfarin tonight then decrease dose to 1 tablet daily except 1 1/2 tablets on Sundays  Recheck in 4 weeks

## 2021-07-29 ENCOUNTER — Ambulatory Visit: Payer: Medicare HMO | Admitting: *Deleted

## 2021-07-29 DIAGNOSIS — I4891 Unspecified atrial fibrillation: Secondary | ICD-10-CM | POA: Diagnosis not present

## 2021-07-29 DIAGNOSIS — Z5181 Encounter for therapeutic drug level monitoring: Secondary | ICD-10-CM

## 2021-07-29 DIAGNOSIS — Z952 Presence of prosthetic heart valve: Secondary | ICD-10-CM | POA: Diagnosis not present

## 2021-07-29 LAB — POCT INR: INR: 3.2 — AB (ref 2.0–3.0)

## 2021-07-29 NOTE — Patient Instructions (Signed)
Took warfarin this morning. Hold warfarin tomorrow then decrease dose to 1 tablet daily  Recheck in 4 weeks

## 2021-08-26 ENCOUNTER — Ambulatory Visit: Payer: Medicare HMO | Admitting: *Deleted

## 2021-08-26 DIAGNOSIS — Z952 Presence of prosthetic heart valve: Secondary | ICD-10-CM

## 2021-08-26 DIAGNOSIS — Z5181 Encounter for therapeutic drug level monitoring: Secondary | ICD-10-CM

## 2021-08-26 LAB — POCT INR: INR: 2.1 (ref 2.0–3.0)

## 2021-08-26 NOTE — Patient Instructions (Signed)
Took warfarin this morning. Continue warfarin 1 tablet daily  Recheck in 4 weeks

## 2021-08-28 NOTE — Progress Notes (Signed)
Cardiology Office Note  Date: 08/29/2021   ID: [name], DOB [birth year], MRN [id number]  PCP:  Adaline Sill, NP  Cardiologist:  Rozann Lesches, MD Electrophysiologist:  None   Chief Complaint  Patient presents with   Cardiac follow-up    History of Present Illness: [name] is a 69 y.o. female last seen in April.  She is here for a routine visit.  She does not report any sense of palpitations or chest pain, states that legs have not been progressively swelling.  I reminded her about salt restriction in the diet.  She reports compliance with diuretic therapy.  She is on Coumadin with follow-up in the anticoagulation clinic.  Recent INR was 2.1.  She does not report any spontaneous bleeding problems.  I reviewed her medications which are listed below.  Today's blood pressure is well controlled.  Her weight is up about 5 pounds.  I personally reviewed her ECG which shows rate controlled atrial fibrillation with diffuse nonspecific ST-T changes.  Past Medical History:  Diagnosis Date   Anxiety disorder    Aortic stenosis    Status post St. Jude mechanical AVR 2007   Asthma    Atrial fibrillation ([place])    Carcinoid tumor of colon 2007   Chronic diastolic heart failure ([facility])    Coronary atherosclerosis of native coronary artery    Status post CABG 2007   GERD (gastroesophageal reflux disease)    History of colonoscopy 2003   Dr. Gala Romney - normal   Hyperlipidemia    Macromastia    Peptic stricture of esophagus 10/04/2010   GE junction on last EGD by Dr. Gala Romney, benign biopsies   RLS (restless legs syndrome)    Schatzki's ring     Past Surgical History:  Procedure Laterality Date   ABDOMINAL HYSTERECTOMY     AORTIC VALVE REPLACEMENT  2007   #25 mm St. Jude mechanical prosthesis with Hemashield tube graft repair of ascending aneurysm   APPENDECTOMY  2007   BACK SURGERY     lumbar 4 and 5    BIOPSY  02/05/2012   RMR:Two tongues of salmon-colored  epithelium distal esophagus, very short-segment Barrett's s/p bx/Small hiatal hernia, otherwise normal stomach, D1, D2. Status post esophageal dilation. Biopsy showed GERD.   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 Gala Romney- schatzki's ring, shoft peptic stricture at GE junction.   ESOPHAGOGASTRODUODENOSCOPY (EGD) WITH PROPOFOL N/A 02/08/2018   Procedure: ESOPHAGOGASTRODUODENOSCOPY (EGD) WITH PROPOFOL;  Surgeon: Daneil Dolin, MD;  [facility];  Service: Endoscopy;  Laterality: N/A;  8:15am   FOOT SURGERY     bilateral bunionectomy   HERNIA REPAIR     with mesh   LAPAROTOMY  2007   small bowel resection secondary to small bowel obstruction   MALONEY DILATION  02/05/2012   Procedure: Venia Minks DILATION;  Surgeon: Daneil Dolin, MD;  Location: [facility];  Service: Endoscopy;  Laterality: N/A;  59mm    MALONEY DILATION N/A 02/08/2018   Procedure: Venia Minks DILATION;  Surgeon: Daneil Dolin, MD;  [facility];  Service:  Endoscopy;  Laterality: N/A;   Teeth removal      Current Outpatient Medications  Medication Sig Dispense Refill   ALPRAZolam (XANAX) 0.5 MG tablet Take 0.25 mg by mouth 2 (two) times a day.      bisoprolol (ZEBETA) 10 MG tablet Take 10 mg by mouth daily.      cetirizine (ZYRTEC) 10 MG tablet Take 10 mg by mouth daily.       Coenzyme Q10 (CO Q 10) 100 MG CAPS Take 1 capsule by mouth daily.     cyclobenzaprine (FLEXERIL) 10 MG tablet Take 10 mg by mouth 3 (three) times daily as needed for muscle spasms.     DAYVIGO 5 MG TABS Take 1 tablet by mouth at bedtime.     diltiazem (CARDIZEM CD) 120 MG 24 hr capsule Take 1 capsule (120 mg  total) by mouth daily. 30 capsule 2   FARXIGA 10 MG TABS tablet Take 10 mg by mouth daily.     levothyroxine (SYNTHROID, LEVOTHROID) 50 MCG tablet Take 50 mcg by mouth daily before breakfast.      Multiple Vitamin (MULTIVITAMIN WITH MINERALS) TABS tablet Take 1 tablet by mouth daily.     omeprazole (PRILOSEC) 20 MG capsule Take 1 capsule (20 mg total) by mouth 2 (two) times daily before a meal. (Patient taking differently: Take 20 mg by mouth daily.) 60 capsule 1   OXYGEN Inhale 2 L into the lungs daily.      potassium chloride SA (KLOR-CON) 20 MEQ tablet Take 40 mEq by mouth 2 (two) times daily.     rOPINIRole (REQUIP) 3 MG tablet Take 3 mg by mouth daily.     rosuvastatin (CRESTOR) 10 MG tablet Take 10 mg by mouth daily.     torsemide (DEMADEX) 100 MG tablet Take 100 mg by mouth daily.     traZODone (DESYREL) 100 MG tablet Take 100 mg by mouth daily.     warfarin (COUMADIN) 2 MG tablet Take 1.5-2 tablets (3-4 mg total) by mouth See admin instructions. 4mg  on Tuesday and 3 mg daily the rest of the week.     No current facility-administered medications for this visit.   Allergies:  Penicillins   ROS: No orthopnea or PND.  Physical Exam: VS:  BP 118/64 (BP Location: Right Arm, Patient Position: Sitting, Cuff Size: Large)   Pulse 63   Ht 5\' 8"  (1.727 m)   Wt 163 lb (73.9 kg)   SpO2 99%   BMI 24.78 kg/m , BMI Body mass index is 24.78 kg/m.  Wt Readings from Last 3 Encounters:  08/29/21 163 lb (73.9 kg)  02/06/21 158 lb 3.2 oz (71.8 kg)  08/03/20 161 lb (73 kg)    General: Patient appears comfortable at rest. HEENT: Conjunctiva and lids normal, wearing a mask. Neck: Supple, no elevated JVP or carotid bruits, no thyromegaly. Lungs: Clear to auscultation, nonlabored breathing at rest. Cardiac: Regular rate and rhythm, no S3, loud prosthetic click in S2. Extremities: Mild ankle edema with venous stasis and hemosiderin changes in the skin.  ECG:  An ECG dated 08/03/2020 was  personally reviewed today and demonstrated:  Atrial fibrillation with low voltage and nonspecific T wave changes.  Recent Labwork:  March 2021: Hemoglobin 10.6, platelets 310, BUN 17, creatinine 1.53, potassium 4.3, AST 31, ALT 16  Other Studies Reviewed Today:  Echocardiogram 08/14/2020:  1. Left ventricular ejection fraction, by estimation, is 60 to 65%. The  left ventricle has normal function. The left ventricle has no  regional  wall motion abnormalities. There is mild left ventricular hypertrophy.  Left ventricular diastolic parameters  are indeterminate.   2. Right ventricular systolic function is moderately reduced. The right  ventricular size is normal. There is normal pulmonary artery systolic  pressure. The estimated right ventricular systolic pressure is 74.9 mmHg.   3. Left atrial size was severely dilated.   4. Right atrial size was moderately dilated.   5. The mitral valve is abnormal, mildly thickened. There is at least  moderate mitral valve regurgitation, eccentric and directed posteriorly.  Moderate mitral annular calcification.   6. The aortic valve has been repaired/replaced. Aortic valve  regurgitation is mild. There is a 25 mm St. Jude mechanical valve present  in the aortic position. Aortic valve mean gradient measures 6.0 mmHg.   7. The inferior vena cava is normal in size with greater than 50%  respiratory variability, suggesting right atrial pressure of 3 mmHg.   Assessment and Plan:  1.  Permanent atrial fibrillation with CHA2DS2-VASc score of 4.  She is asymptomatic and heart rate is well controlled on Cardizem CD.  Continue Coumadin for stroke prophylaxis.  2.  Status post mechanical AVR in 2007.  She has a 25 mm St. Jude prosthesis in place, mean gradient 6 mmHg by echocardiogram last year.  Continue Coumadin with follow-up in the anticoagulation clinic.  3.  Chronic diastolic heart failure and moderate RV dysfunction, prone to fluid overload.  We  discussed sodium restriction, she states that she has been compliant with Demadex and potassium supplement.  Weight up about 5 pounds.  Requesting interval lab work from PCP.  If necessary, could be considered for addition of intermittent metolazone.  4.  Moderate mitral regurgitation by echocardiogram last year, follow-up study will be arranged.  Medication Adjustments/Labs and Tests Ordered: Current medicines are reviewed at length with the patient today.  Concerns regarding medicines are outlined above.   Tests Ordered: Orders Placed This Encounter  Procedures   EKG 12-Lead   ECHOCARDIOGRAM COMPLETE    Medication Changes: No orders of the defined types were placed in this encounter.   Disposition:  Follow up  6 months.  Signed, Satira Sark, MD, [facility] 08/29/2021 11:46 AM    [facility] [place], [place], [place] [address] Phone: [phone]; Fax: [phone]

## 2021-08-29 ENCOUNTER — Encounter: Payer: Self-pay | Admitting: *Deleted

## 2021-08-29 ENCOUNTER — Ambulatory Visit (INDEPENDENT_AMBULATORY_CARE_PROVIDER_SITE_OTHER): Payer: Medicare HMO | Admitting: Cardiology

## 2021-08-29 ENCOUNTER — Encounter: Payer: Self-pay | Admitting: Cardiology

## 2021-08-29 ENCOUNTER — Other Ambulatory Visit: Payer: Self-pay

## 2021-08-29 VITALS — BP 118/64 | HR 63 | Ht 68.0 in | Wt 163.0 lb

## 2021-08-29 DIAGNOSIS — I5032 Chronic diastolic (congestive) heart failure: Secondary | ICD-10-CM | POA: Diagnosis not present

## 2021-08-29 DIAGNOSIS — I4821 Permanent atrial fibrillation: Secondary | ICD-10-CM | POA: Diagnosis not present

## 2021-08-29 DIAGNOSIS — I340 Nonrheumatic mitral (valve) insufficiency: Secondary | ICD-10-CM | POA: Diagnosis not present

## 2021-08-29 DIAGNOSIS — Z952 Presence of prosthetic heart valve: Secondary | ICD-10-CM

## 2021-08-29 NOTE — Patient Instructions (Addendum)

## 2021-09-04 ENCOUNTER — Telehealth: Payer: Self-pay | Admitting: *Deleted

## 2021-09-04 NOTE — Telephone Encounter (Signed)
-----   Message from Satira Sark, MD sent at 09/03/2021  3:38 PM EST ----- Results reviewed.  Cholesterol is very high with LDL 216, had previously been better controlled.  Please check to see if she is still taking Crestor 10 mg daily.  If so would at least increase to 20 mg daily for now.

## 2021-09-19 ENCOUNTER — Encounter: Payer: Self-pay | Admitting: *Deleted

## 2021-09-19 NOTE — Telephone Encounter (Signed)
Letter mailed

## 2021-09-23 ENCOUNTER — Other Ambulatory Visit: Payer: Self-pay

## 2021-09-23 ENCOUNTER — Ambulatory Visit (INDEPENDENT_AMBULATORY_CARE_PROVIDER_SITE_OTHER): Payer: Medicare HMO | Admitting: *Deleted

## 2021-09-23 DIAGNOSIS — Z5181 Encounter for therapeutic drug level monitoring: Secondary | ICD-10-CM | POA: Diagnosis not present

## 2021-09-23 DIAGNOSIS — Z952 Presence of prosthetic heart valve: Secondary | ICD-10-CM

## 2021-09-23 LAB — POCT INR: INR: 1.9 — AB (ref 2.0–3.0)

## 2021-09-23 MED ORDER — ROSUVASTATIN CALCIUM 20 MG PO TABS: 20.0000 mg | ORAL_TABLET | Freq: Every day | ORAL | 3 refills | Status: DC

## 2021-09-23 NOTE — Telephone Encounter (Signed)
Here at office for INR check Patient informed and verbalized understanding of plan.

## 2021-09-23 NOTE — Addendum Note (Signed)
Addended by: Merlene Laughter on: 09/23/2021 10:38 AM   Modules accepted: Orders

## 2021-09-23 NOTE — Patient Instructions (Signed)
Took warfarin this morning. Increase warfarin to 1 tablet daily except 1 1/2 tablets on Mondays.  Take extra 1/2 tablet today since you already took 1 tablet. Recheck in 4 weeks

## 2021-10-22 ENCOUNTER — Ambulatory Visit: Payer: Medicare Other | Admitting: *Deleted

## 2021-10-22 DIAGNOSIS — Z952 Presence of prosthetic heart valve: Secondary | ICD-10-CM | POA: Diagnosis not present

## 2021-10-22 DIAGNOSIS — Z5181 Encounter for therapeutic drug level monitoring: Secondary | ICD-10-CM | POA: Diagnosis not present

## 2021-10-22 DIAGNOSIS — I482 Chronic atrial fibrillation, unspecified: Secondary | ICD-10-CM

## 2021-10-22 LAB — POCT INR: INR: 2.1 (ref 2.0–3.0)

## 2021-10-22 NOTE — Patient Instructions (Signed)
Took warfarin this morning.  Pt is taking the 1 1/2 tablets on Sundays. Continue warfarin to 1 tablet daily except 1 1/2 tablets on Sunday.   Recheck in 5 weeks

## 2021-10-31 ENCOUNTER — Ambulatory Visit (INDEPENDENT_AMBULATORY_CARE_PROVIDER_SITE_OTHER): Payer: Medicare Other

## 2021-10-31 DIAGNOSIS — Z952 Presence of prosthetic heart valve: Secondary | ICD-10-CM

## 2021-10-31 LAB — ECHOCARDIOGRAM COMPLETE
AV Mean grad: 6.7 mmHg
AV Peak grad: 11.4 mmHg
Ao pk vel: 1.69 m/s
Area-P 1/2: 4.49 cm2
MV M vel: 4.89 m/s
MV Peak grad: 95.6 mmHg
S' Lateral: 3.54 cm

## 2021-11-06 ENCOUNTER — Telehealth: Payer: Self-pay | Admitting: *Deleted

## 2021-11-06 NOTE — Telephone Encounter (Signed)
-----   Message from Satira Sark, MD sent at 10/31/2021  5:23 PM EST ----- Results reviewed.  LVEF normal at 55 to 60%.  RV contraction mildly to moderately reduced and stable.  Mitral regurgitation is mild at this time.  St. Jude aortic prosthesis is also stable.  Continue with current medications and follow-up plan.

## 2021-11-21 ENCOUNTER — Encounter: Payer: Self-pay | Admitting: *Deleted

## 2021-11-21 NOTE — Telephone Encounter (Signed)
Multiple attempts to reach by phone with no success Letter mailed Copy sent to PCP

## 2021-11-26 ENCOUNTER — Ambulatory Visit: Payer: Medicare Other | Admitting: *Deleted

## 2021-11-26 ENCOUNTER — Other Ambulatory Visit: Payer: Self-pay

## 2021-11-26 DIAGNOSIS — I4891 Unspecified atrial fibrillation: Secondary | ICD-10-CM | POA: Diagnosis not present

## 2021-11-26 DIAGNOSIS — Z952 Presence of prosthetic heart valve: Secondary | ICD-10-CM

## 2021-11-26 DIAGNOSIS — Z5181 Encounter for therapeutic drug level monitoring: Secondary | ICD-10-CM

## 2021-11-26 LAB — POCT INR: INR: 1.7 — AB (ref 2.0–3.0)

## 2021-11-26 NOTE — Patient Instructions (Signed)
Take extra 1/2 warfarin today then increase dose to 1 tablet daily except 1 1/2 tablets on Sunday and Wednesdays. Recheck in 3 weeks

## 2021-12-17 ENCOUNTER — Ambulatory Visit: Payer: Medicare Other | Admitting: *Deleted

## 2021-12-17 DIAGNOSIS — Z5181 Encounter for therapeutic drug level monitoring: Secondary | ICD-10-CM

## 2021-12-17 DIAGNOSIS — I482 Chronic atrial fibrillation, unspecified: Secondary | ICD-10-CM | POA: Diagnosis not present

## 2021-12-17 DIAGNOSIS — Z952 Presence of prosthetic heart valve: Secondary | ICD-10-CM | POA: Diagnosis not present

## 2021-12-17 DIAGNOSIS — Z7901 Long term (current) use of anticoagulants: Secondary | ICD-10-CM | POA: Diagnosis not present

## 2021-12-17 LAB — POCT INR: INR: 1.9 — AB (ref 2.0–3.0)

## 2021-12-17 NOTE — Patient Instructions (Signed)
Increase warfarin to 1 tablet daily except 1 1/2 tablets on Sunday, Tuesdays and Thursdays. Recheck in 3 weeks

## 2022-01-07 ENCOUNTER — Ambulatory Visit: Payer: Medicare Other | Admitting: *Deleted

## 2022-01-07 DIAGNOSIS — Z952 Presence of prosthetic heart valve: Secondary | ICD-10-CM | POA: Diagnosis not present

## 2022-01-07 DIAGNOSIS — I4891 Unspecified atrial fibrillation: Secondary | ICD-10-CM

## 2022-01-07 DIAGNOSIS — Z5181 Encounter for therapeutic drug level monitoring: Secondary | ICD-10-CM | POA: Diagnosis not present

## 2022-01-07 LAB — POCT INR: INR: 2 (ref 2.0–3.0)

## 2022-01-07 NOTE — Patient Instructions (Signed)
Continue 1 tablet daily except 1 1/2 tablets on Sunday, Tuesdays and Thursdays. ?Recheck in 4 weeks ?

## 2022-02-04 ENCOUNTER — Ambulatory Visit: Payer: Medicare Other | Admitting: *Deleted

## 2022-02-04 DIAGNOSIS — Z5181 Encounter for therapeutic drug level monitoring: Secondary | ICD-10-CM | POA: Diagnosis not present

## 2022-02-04 DIAGNOSIS — Z952 Presence of prosthetic heart valve: Secondary | ICD-10-CM

## 2022-02-04 LAB — POCT INR: INR: 2.3 (ref 2.0–3.0)

## 2022-02-04 NOTE — Patient Instructions (Signed)
Continue 1 tablet daily except 1 1/2 tablets on Sunday, Tuesdays and Thursdays. ?Recheck in 6 weeks ?

## 2022-03-04 ENCOUNTER — Encounter: Payer: Self-pay | Admitting: Cardiology

## 2022-03-04 ENCOUNTER — Ambulatory Visit (INDEPENDENT_AMBULATORY_CARE_PROVIDER_SITE_OTHER): Payer: Medicare Other | Admitting: Cardiology

## 2022-03-04 VITALS — BP 118/70 | HR 73 | Ht 67.0 in | Wt 156.8 lb

## 2022-03-04 DIAGNOSIS — I4821 Permanent atrial fibrillation: Secondary | ICD-10-CM | POA: Diagnosis not present

## 2022-03-04 DIAGNOSIS — N1832 Chronic kidney disease, stage 3b: Secondary | ICD-10-CM

## 2022-03-04 DIAGNOSIS — I5032 Chronic diastolic (congestive) heart failure: Secondary | ICD-10-CM

## 2022-03-04 DIAGNOSIS — Z952 Presence of prosthetic heart valve: Secondary | ICD-10-CM

## 2022-03-04 NOTE — Patient Instructions (Signed)

## 2022-03-04 NOTE — Progress Notes (Signed)
? ? ?Cardiology Office Note ? ?Date: 03/04/2022  ? ?ID: Meryl Crutch, DOB 1951/08/22, MRN [id number] ? ?PCP:  Adaline Sill, NP  ?Cardiologist:  Rozann Lesches, MD ?Electrophysiologist:  None  ? ?Chief Complaint  ?Patient presents with  ? Cardiac follow-up  ? ? ?History of Present Illness: ?NAKEYSHA PASQUAL is a 71 y.o. female last seen in November 2022.  She is here for a routine visit.  Reports good control of fluid weight, in fact she is down 6 pounds from last visit.  No change in current dose of Demadex. ? ?She remains on Coumadin with follow-up in anticoagulation clinic.  No spontaneous bleeding problems. ? ?I reviewed her echo from January.  LVEF is 55 to 60% at that time, mildly to moderately reduced RV contraction, stable mechanical aortic prosthesis with normal function. ? ?Requesting interval lab work from PCP for review. ? ?Past Medical History:  ?Diagnosis Date  ? Anxiety disorder   ? Aortic stenosis   ? Status post St. Jude mechanical AVR 2007  ? Asthma   ? Atrial fibrillation ([place])   ? Carcinoid tumor of colon 2007  ? Chronic diastolic heart failure ([place])   ? Coronary atherosclerosis of native coronary artery   ? Status post CABG 2007  ? GERD (gastroesophageal reflux disease)   ? History of colonoscopy 2003  ? Dr. Gala Romney - normal  ? Hyperlipidemia   ? Macromastia   ? Peptic stricture of esophagus 10/04/2010  ? GE junction on last EGD by Dr. Gala Romney, benign biopsies  ? RLS (restless legs syndrome)   ? Schatzki's ring   ? ? ?Past Surgical History:  ?Procedure Laterality Date  ? ABDOMINAL HYSTERECTOMY    ? AORTIC VALVE REPLACEMENT  2007  ? #25 mm St. Jude mechanical prosthesis with Hemashield tube graft repair of ascending aneurysm  ? APPENDECTOMY  2007  ? BACK SURGERY    ? lumbar 4 and 5   ? BIOPSY  02/05/2012  ? RMR:Two tongues of salmon-colored epithelium distal esophagus, very short-segment Barrett's s/p bx/Small hiatal hernia, otherwise normal stomach, D1, D2. Status post esophageal dilation.  Biopsy showed GERD.  ? Breast cyst removed    ? bilateral  ? BREAST REDUCTION SURGERY    ? CESAREAN SECTION    ? COLON SURGERY  01/2006  ? Secondary ? Appendiceal carcinoid  ? COLONOSCOPY  02/05/2012  ? YJE:[id number] rectum, sigmoid diverticulosis,descending colon polyp , tubular adenoma  ? CORONARY ARTERY BYPASS GRAFT    ? 06/2006 - RIMA to RCA, SVG to RCA  ? ESOPHAGOGASTRODUODENOSCOPY  10/03/2010  ? Dr. Gala Romney- schatzki's ring, shoft peptic stricture at GE junction.  ? ESOPHAGOGASTRODUODENOSCOPY (EGD) WITH PROPOFOL N/A 02/08/2018  ? Procedure: ESOPHAGOGASTRODUODENOSCOPY (EGD) WITH PROPOFOL;  Surgeon: Daneil Dolin, MD;  [facility];  Service: Endoscopy;  Laterality: N/A;  8:15am  ? FOOT SURGERY    ? bilateral bunionectomy  ? HERNIA REPAIR    ? with mesh  ? LAPAROTOMY  2007  ? small bowel resection secondary to small bowel obstruction  ? MALONEY DILATION  02/05/2012  ? Procedure: MALONEY DILATION;  Surgeon: Daneil Dolin, MD;  Location: [facility];  Service: Endoscopy;  Laterality: N/A;  29m   ? MALONEY DILATION N/A 02/08/2018  ? Procedure: MALONEY DILATION;  Surgeon: RDaneil Dolin MD;  [facility];  Service: Endoscopy;  Laterality: N/A;  ? Teeth removal    ? ? ?Current Outpatient Medications  ?Medication Sig Dispense Refill  ? ALPRAZolam (  XANAX) 0.5 MG tablet Take 0.5 mg by mouth 2 (two) times daily as needed for anxiety.    ? bisoprolol (ZEBETA) 5 MG tablet Take 5 mg by mouth daily.    ? CAPLYTA 42 MG capsule Take 42 mg by mouth daily.    ? cetirizine (ZYRTEC) 10 MG tablet Take 10 mg by mouth daily.      ? cyclobenzaprine (FLEXERIL) 10 MG tablet Take 10 mg by mouth 3 (three) times daily as needed for muscle spasms.    ? diltiazem (CARDIZEM CD) 120 MG 24 hr capsule Take 1 capsule (120 mg total) by mouth daily. 30 capsule 2  ? FARXIGA 10 MG TABS tablet Take 10 mg by mouth daily.    ? levothyroxine (SYNTHROID, LEVOTHROID) 50 MCG tablet Take 50 mcg by mouth daily before breakfast.     ? mometasone  (NASONEX) 50 MCG/ACT nasal spray Place 2 sprays into the nose at bedtime.    ? Multiple Vitamin (MULTIVITAMIN WITH MINERALS) TABS tablet Take 1 tablet by mouth daily.    ? omeprazole (PRILOSEC) 20 MG capsule Take 20 mg by mouth daily.    ? OXYGEN Inhale 2 L into the lungs daily.     ? potassium chloride SA (KLOR-CON) 20 MEQ tablet Take 40 mEq by mouth 2 (two) times daily.    ? rOPINIRole (REQUIP) 3 MG tablet Take 3 mg by mouth 2 (two) times daily.    ? rosuvastatin (CRESTOR) 20 MG tablet Take 1 tablet (20 mg total) by mouth daily. 90 tablet 3  ? torsemide (DEMADEX) 100 MG tablet Take 100 mg by mouth daily.    ? warfarin (COUMADIN) 2 MG tablet Take 1.5-2 tablets (3-4 mg total) by mouth See admin instructions. '4mg'$  on Tuesday and 3 mg daily the rest of the week.    ? ?No current facility-administered medications for this visit.  ? ?Allergies:  Penicillins  ? ?ROS:  Sinus drainage and seasonal allergies. ? ?Physical Exam: ?VS:  BP 118/70   Pulse 73   Ht '5\' 7"'$  (1.702 m)   Wt 156 lb 12.8 oz (71.1 kg)   SpO2 93%   BMI 24.56 kg/m? , BMI Body mass index is 24.56 kg/m?. ? ?Wt Readings from Last 3 Encounters:  ?03/04/22 156 lb 12.8 oz (71.1 kg)  ?08/29/21 163 lb (73.9 kg)  ?02/06/21 158 lb 3.2 oz (71.8 kg)  ?  ?General: Patient appears comfortable at rest. ?HEENT: Conjunctiva and lids normal. ?Neck: Supple, no elevated JVP or carotid bruits, no thyromegaly. ?Lungs: Clear to auscultation, nonlabored breathing at rest. ?Cardiac: Regular rate and rhythm, no S3, prosthetic click in S2. ?Extremities: No pitting edema, chronic venous stasis and hemosiderin changes. ? ?ECG:  An ECG dated 08/29/2021 was personally reviewed today and demonstrated:  Atrial fibrillation with diffuse nonspecific ST-T changes. ? ?Recent Labwork: ? ?May 2022: Hemoglobin 10.8, platelets 288, cholesterol 282, triglycerides 119, HDL 44, LDL 216, BUN 22, creatinine 2.01, potassium 3.6, AST 27, ALT 11, TSH 3.62 ? ?Other Studies Reviewed  Today: ? ?Echocardiogram 10/31/2021: ? 1. Left ventricular ejection fraction, by estimation, is 55 to 60%. The  ?left ventricle has normal function. The left ventricle has no regional  ?wall motion abnormalities. Left ventricular diastolic parameters are  ?indeterminate.  ? 2. Right ventricular systolic function mild to moderately reduced. The  ?right ventricular size is mildly enlarged.  ? 3. Left atrial size was severely dilated.  ? 4. Right atrial size was mildly dilated.  ? 5. The mitral valve  is abnormal. Mild mitral valve regurgitation. No  ?evidence of mitral stenosis. Moderate mitral annular calcification.  ? 6. The tricuspid valve is abnormal.  ? 7. 25 mm mechanical St Jude valve is in the AV position. . The aortic  ?valve has been repaired/replaced. Aortic valve regurgitation is not  ?visualized. No aortic stenosis is present.  ? 8. The inferior vena cava is normal in size with greater than 50%  ?respiratory variability, suggesting right atrial pressure of 3 mmHg.  ? ?Assessment and Plan: ? ?1.  Permanent atrial fibrillation with CHA2DS2-VASc score of 4.  She is doing well without sense of palpitations and continues on bisoprolol, Cardizem CD, and Coumadin. ? ?2.  Aortic valve disease status post mechanical AVR in 2007.  25 mm Saint Jude prosthesis stable as of echocardiogram in January of this year.  She remains on chronic Coumadin with follow-up in anticoagulation clinic.  Last INR 2.3. ? ?3.  CKD stage IIIb, last creatinine 2.01. ? ?4.  HFpEF and associated moderate RV dysfunction.  Fluid weight well controlled on current dose of Demadex with potassium supplement.  Requesting interval lab work from PCP for review. ? ?Medication Adjustments/Labs and Tests Ordered: ?Current medicines are reviewed at length with the patient today.  Concerns regarding medicines are outlined above.  ? ?Tests Ordered: ?No orders of the defined types were placed in this encounter. ? ? ?Medication Changes: ?No orders of the  defined types were placed in this encounter. ? ? ?Disposition:  Follow up  6 months. ? ?Signed, ?Satira Sark, MD, [facility] ?03/04/2022 11:04 AM    ?[facility] ?[place], [place], [place] [address] ?P

## 2022-03-18 ENCOUNTER — Ambulatory Visit (INDEPENDENT_AMBULATORY_CARE_PROVIDER_SITE_OTHER): Payer: Medicare Other | Admitting: *Deleted

## 2022-03-18 DIAGNOSIS — Z952 Presence of prosthetic heart valve: Secondary | ICD-10-CM | POA: Diagnosis not present

## 2022-03-18 DIAGNOSIS — Z5181 Encounter for therapeutic drug level monitoring: Secondary | ICD-10-CM | POA: Diagnosis not present

## 2022-03-18 DIAGNOSIS — I4891 Unspecified atrial fibrillation: Secondary | ICD-10-CM | POA: Diagnosis not present

## 2022-03-18 LAB — POCT INR: INR: 2.2 (ref 2.0–3.0)

## 2022-03-18 NOTE — Patient Instructions (Signed)
Continue 1 tablet daily except 1 1/2 tablets on Sunday, Tuesdays and Thursdays. Recheck in 6 weeks

## 2022-04-29 ENCOUNTER — Ambulatory Visit: Payer: Medicare Other | Admitting: *Deleted

## 2022-04-29 DIAGNOSIS — Z5181 Encounter for therapeutic drug level monitoring: Secondary | ICD-10-CM

## 2022-04-29 DIAGNOSIS — Z952 Presence of prosthetic heart valve: Secondary | ICD-10-CM

## 2022-04-29 LAB — POCT INR: INR: 1.9 — AB (ref 2.0–3.0)

## 2022-04-29 NOTE — Patient Instructions (Signed)
Take warfarin extra 1/2 today then resume 1 tablet daily except 1 1/2 tablets on Sunday, Tuesdays and Thursdays. Recheck in 6 weeks

## 2022-05-27 ENCOUNTER — Other Ambulatory Visit: Payer: Self-pay | Admitting: Cardiology

## 2022-06-10 ENCOUNTER — Ambulatory Visit: Payer: Medicare Other | Admitting: *Deleted

## 2022-06-10 DIAGNOSIS — Z5181 Encounter for therapeutic drug level monitoring: Secondary | ICD-10-CM | POA: Diagnosis not present

## 2022-06-10 DIAGNOSIS — I4891 Unspecified atrial fibrillation: Secondary | ICD-10-CM

## 2022-06-10 DIAGNOSIS — Z952 Presence of prosthetic heart valve: Secondary | ICD-10-CM | POA: Diagnosis not present

## 2022-06-10 LAB — POCT INR: INR: 2.6 (ref 2.0–3.0)

## 2022-06-10 NOTE — Patient Instructions (Signed)
Continue warfarin 1 tablet daily except 1 1/2 tablets on Sunday, Tuesdays and Thursdays. Recheck in 6 weeks 

## 2022-07-22 ENCOUNTER — Ambulatory Visit: Payer: Medicare Other | Attending: Cardiology | Admitting: *Deleted

## 2022-07-22 DIAGNOSIS — Z5181 Encounter for therapeutic drug level monitoring: Secondary | ICD-10-CM | POA: Diagnosis not present

## 2022-07-22 DIAGNOSIS — Z952 Presence of prosthetic heart valve: Secondary | ICD-10-CM

## 2022-07-22 DIAGNOSIS — I4891 Unspecified atrial fibrillation: Secondary | ICD-10-CM

## 2022-07-22 LAB — POCT INR: INR: 3.2 — AB (ref 2.0–3.0)

## 2022-07-22 NOTE — Patient Instructions (Signed)
Hold warfarin tomorrow then resume 1 tablet daily except 1 1/2 tablets on Sunday, Tuesdays and Thursdays. Recheck in 6 weeks

## 2022-09-02 ENCOUNTER — Ambulatory Visit: Payer: Medicare Other | Attending: Cardiology | Admitting: *Deleted

## 2022-09-02 DIAGNOSIS — Z952 Presence of prosthetic heart valve: Secondary | ICD-10-CM

## 2022-09-02 DIAGNOSIS — Z5181 Encounter for therapeutic drug level monitoring: Secondary | ICD-10-CM | POA: Diagnosis not present

## 2022-09-02 LAB — POCT INR: INR: 2.1 (ref 2.0–3.0)

## 2022-09-02 NOTE — Progress Notes (Unsigned)
Cardiology Office Note  Date: 09/04/2022   ID: [name], DOB 1951/05/03, MRN [id number]  PCP:  Amy Sill, NP  Cardiologist:  Amy Lesches, MD Electrophysiologist:  None   Chief Complaint  Patient presents with   Cardiac follow-up    History of Present Illness: [name] is a 71 y.o. female last seen in May.  She is here for a routine visit.  Reports stable NYHA class II dyspnea, no significant fluid gain or weight change on current regimen.  She is on Coumadin with follow-up in the anticoagulation clinic.  She does not report any spontaneous bleeding problems.  Recent INR was 2.1.  Echocardiogram from January revealed LVEF 55 to 60%, moderately reduced RV contraction, mild mitral regurgitation, and stable St. Jude AVR with mean gradient 7 mmHg and no significant aortic regurgitation.  I went over the remainder of her medications.  She is tolerating [place] along with Demadex and potassium supplement.  She had interval lab work in August which I reviewed as well.  Still following with PCP.  I personally reviewed her ECG today which shows rate controlled atrial fibrillation with lead motion artifact and nonspecific ST-T changes.  Past Medical History:  Diagnosis Date   Anxiety disorder    Aortic stenosis    Status post St. Jude mechanical AVR 2007   Asthma    Atrial fibrillation ([place])    Carcinoid tumor of colon 2007   Chronic diastolic heart failure (HCC)    Coronary atherosclerosis of native coronary artery    Status post CABG 2007   GERD (gastroesophageal reflux disease)    History of colonoscopy 2003   Dr. Gala Mendez - normal   Hyperlipidemia    Macromastia    Peptic stricture of esophagus 10/04/2010   GE junction on last EGD by Dr. Gala Mendez, benign biopsies   RLS (restless legs syndrome)    Schatzki's ring     Past Surgical History:  Procedure Laterality Date   ABDOMINAL HYSTERECTOMY     AORTIC VALVE REPLACEMENT  2007   #25 mm St. Jude  mechanical prosthesis with Hemashield tube graft repair of ascending aneurysm   APPENDECTOMY  2007   BACK SURGERY     lumbar 4 and 5    BIOPSY  02/05/2012   RMR:Two tongues of salmon-colored epithelium distal esophagus, very short-segment Barrett's s/p bx/Small hiatal hernia, otherwise normal stomach, D1, D2. Status post esophageal dilation. Biopsy showed GERD.   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 Gala Mendez- schatzki's ring, shoft peptic stricture at GE junction.   ESOPHAGOGASTRODUODENOSCOPY (EGD) WITH PROPOFOL N/A 02/08/2018   Procedure: ESOPHAGOGASTRODUODENOSCOPY (EGD) WITH PROPOFOL;  Surgeon: Amy Dolin, MD;  Location: [facility];  Service: Endoscopy;  Laterality: N/A;  8:15am   FOOT SURGERY     bilateral bunionectomy   HERNIA REPAIR     with mesh   LAPAROTOMY  2007   small bowel resection secondary to small bowel obstruction   MALONEY DILATION  02/05/2012   Procedure: [name] DILATION;  Surgeon: Amy Dolin, MD;  Location: [facility] ORS;  Service: Endoscopy;  Laterality: N/A;  72m    MALONEY DILATION N/A 02/08/2018   Procedure: MVenia MinksDILATION;  Surgeon: RDaneil Dolin MD;  Location: [facility];  Service: Endoscopy;  Laterality: N/A;   Teeth removal      Current Outpatient Medications  Medication Sig Dispense Refill   ALPRAZolam (XANAX) 0.5 MG tablet Take 0.5 mg by mouth 2 (two) times daily as needed for anxiety.     bisoprolol (ZEBETA) 5 MG tablet Take 5 mg by mouth daily.     CAPLYTA 42 MG capsule Take 42 mg by mouth daily.     cetirizine (ZYRTEC) 10 MG tablet Take 10 mg by mouth daily.       cyclobenzaprine (FLEXERIL) 10 MG tablet Take 10  mg by mouth 3 (three) times daily as needed for muscle spasms.     diltiazem (CARDIZEM CD) 120 MG 24 hr capsule Take 1 capsule (120 mg total) by mouth daily. 30 capsule 2   FARXIGA 10 MG TABS tablet Take 10 mg by mouth daily.     levothyroxine (SYNTHROID, LEVOTHROID) 50 MCG tablet Take 50 mcg by mouth daily before breakfast.      mometasone (NASONEX) 50 MCG/ACT nasal spray Place 2 sprays into the nose at bedtime.     Multiple Vitamin (MULTIVITAMIN WITH MINERALS) TABS tablet Take 1 tablet by mouth daily.     omeprazole (PRILOSEC) 20 MG capsule Take 20 mg by mouth daily.     OXYGEN Inhale 2 L into the lungs daily.      potassium chloride SA (KLOR-CON) 20 MEQ tablet Take 40 mEq by mouth 2 (two) times daily.     rOPINIRole (REQUIP) 3 MG tablet Take 3 mg by mouth 2 (two) times daily.     rosuvastatin (CRESTOR) 20 MG tablet TAKE ONE (1) TABLET BY MOUTH EVERY DAY 90 tablet 3   torsemide (DEMADEX) 100 MG tablet Take 100 mg by mouth daily.     warfarin (COUMADIN) 2 MG tablet Take 1.5-2 tablets (3-4 mg total) by mouth See admin instructions. 422mon Tuesday and 3 mg daily the rest of the week.     No current facility-administered medications for this visit.   Allergies:  Penicillins   ROS: Insomnia.  Physical Exam: VS:  BP 104/62   Pulse 70   Ht 5' 8" (1.727 m)   Wt 156 lb 3.2 oz (70.9 kg)   SpO2 90%   BMI 23.75 kg/m , BMI Body mass index is 23.75 kg/m.  Wt Readings from Last 3 Encounters:  09/04/22 156 lb 3.2 oz (70.9 kg)  03/04/22 156 lb 12.8 oz (71.1 kg)  08/29/21 163 lb (73.9 kg)    General: Patient appears comfortable at rest. HEENT: Conjunctiva and lids normal. Neck: Supple, no elevated JVP or carotid bruits. Lungs: Clear to auscultation, nonlabored breathing at rest. Cardiac: Irregularly irregular with crisp prosthetic valve sound in S2, 1/6 systolic murmur. Extremities: No pitting edema.  ECG:  An ECG dated 08/29/2021 was personally reviewed today and demonstrated:  Atrial  fibrillation with nonspecific ST-T changes.  Recent Labwork:  August 2023: Hemoglobin 9.4, platelets 160, BUN 21, creatinine 1.77, potassium 3.6, AST 27, ALT 11, EGFR 31, cholesterol 115, glycerides 84, HDL 37, LDL 61, TSH 2, BNP 570  Other Studies Reviewed Today:  Echocardiogram 10/31/2021:  1. Left ventricular ejection fraction, by estimation, is 55 to 60%. The  left ventricle has normal function. The left ventricle has no regional  wall motion abnormalities. Left ventricular diastolic parameters are  indeterminate.   2. Right ventricular systolic function mild to moderately reduced. The  right ventricular size is mildly enlarged.   3. Left atrial size was severely dilated.   4. Right atrial size was mildly dilated.   5. The mitral valve is abnormal. Mild mitral valve regurgitation. No  evidence of mitral stenosis. Moderate mitral annular calcification.   6. The tricuspid valve is abnormal.   7. 25 mm mechanical St Jude valve is in the AV position. . The aortic  valve has been repaired/replaced. Aortic valve regurgitation is not  visualized. No aortic stenosis is present.   8. The inferior vena cava is normal in size with greater than 50%  respiratory variability, suggesting right atrial pressure of 3 mmHg.   Assessment and Plan:  1.  Permanent atrial fibrillation with CHA2DS2-VASc score of 4.  Heart rate well controlled and she is asymptomatic on bisoprolol and Cardizem CD.  She remains on Coumadin for stroke prophylaxis.  2.  Aortic valve disease status post St. Jude AVR in 2007, normal prosthetic function by echocardiogram in January.  Continue Coumadin with follow-up in anticoagulation clinic.  3.  CKD stage IIIb, last creatinine 1.77 with normal potassium.  4.  HFpEF with LVEF 55 to 60%, also moderate RV dysfunction.  Fluid status stable on Farxiga and Demadex with potassium supplement.  Medication Adjustments/Labs and Tests Ordered: Current medicines are reviewed at length  with the patient today.  Concerns regarding medicines are outlined above.   Tests Ordered: Orders Placed This Encounter  Procedures   EKG 12-Lead    Medication Changes: No orders of the defined types were placed in this encounter.   Disposition:  Follow up  6 months.  Signed, Satira Sark, MD, [facility] 09/04/2022 11:46 AM    [place] at [address], [place], [place] [address] Phone: [phone]; Fax: [phone]

## 2022-09-02 NOTE — Patient Instructions (Signed)
Continue warfarin 1 tablet daily except 1 1/2 tablets on Sunday, Tuesdays and Thursdays. Recheck in 6 weeks

## 2022-09-04 ENCOUNTER — Ambulatory Visit: Payer: Medicare Other | Attending: Cardiology | Admitting: Cardiology

## 2022-09-04 ENCOUNTER — Encounter: Payer: Self-pay | Admitting: Cardiology

## 2022-09-04 VITALS — BP 104/62 | HR 70 | Ht 68.0 in | Wt 156.2 lb

## 2022-09-04 DIAGNOSIS — I5032 Chronic diastolic (congestive) heart failure: Secondary | ICD-10-CM | POA: Diagnosis not present

## 2022-09-04 DIAGNOSIS — N1832 Chronic kidney disease, stage 3b: Secondary | ICD-10-CM

## 2022-09-04 DIAGNOSIS — Z952 Presence of prosthetic heart valve: Secondary | ICD-10-CM | POA: Diagnosis not present

## 2022-09-04 DIAGNOSIS — I4821 Permanent atrial fibrillation: Secondary | ICD-10-CM | POA: Diagnosis not present

## 2022-09-04 NOTE — Patient Instructions (Signed)

## 2022-09-17 ENCOUNTER — Other Ambulatory Visit: Payer: Self-pay

## 2022-09-17 ENCOUNTER — Emergency Department (HOSPITAL_COMMUNITY)
Admission: EM | Admit: 2022-09-17 | Discharge: 2022-09-17 | Disposition: A | Discharge status: Home / Self Care | Payer: Medicare Other | Attending: Emergency Medicine | Admitting: Emergency Medicine

## 2022-09-17 ENCOUNTER — Encounter (HOSPITAL_COMMUNITY): Payer: Self-pay | Admitting: Emergency Medicine

## 2022-09-17 DIAGNOSIS — S00412A Abrasion of left ear, initial encounter: Secondary | ICD-10-CM | POA: Insufficient documentation

## 2022-09-17 DIAGNOSIS — J449 Chronic obstructive pulmonary disease, unspecified: Secondary | ICD-10-CM | POA: Diagnosis not present

## 2022-09-17 DIAGNOSIS — X58XXXA Exposure to other specified factors, initial encounter: Secondary | ICD-10-CM | POA: Insufficient documentation

## 2022-09-17 DIAGNOSIS — S00402A Unspecified superficial injury of left ear, initial encounter: Secondary | ICD-10-CM | POA: Diagnosis present

## 2022-09-17 NOTE — ED Triage Notes (Signed)
Pt reports she is on blood thinners, scratches a scab on right ear and has been bleeding since 1500, bleeding controlled at this time

## 2022-09-17 NOTE — Discharge Instructions (Signed)
Thank you for letting us take care of you today.  We were able to successfully stop the bleeding from the cut on your ear. If this recurs, hold direct pressure for several minutes in attempt to stop bleed. If unable to stop bleeding, you should return to ED for further evaluation.\  Continue taking all of your medications as prescribed by your regular doctor.

## 2022-09-17 NOTE — ED Provider Notes (Signed)
[facility] EMERGENCY DEPARTMENT Provider Note   CSN: [id number] Arrival date & time: 09/17/22  1802     History  Chief Complaint  Patient presents with   ear bleeding    [name] is a 71 y.o. female anticoagulated on Coumadin due to prior valve replacement who presents to the ED due to uncontrolled bleeding from her left ear.  Patient states 2 weeks ago she had long fingernails and scratched her left ear causing a small abrasion but it scabbed over and was not causing her any problems until tonight when she accidentally pulled off the scab and could not control the bleeding.  She does not have any lightheadedness, shortness of breath, dizziness, chest pain, shortness of breath, or history of anemia.  At baseline, she is a little hypoxic and does require oxygen via nasal cannula at night due to her history of COPD.  She has no further concerns.      Home Medications Prior to Admission medications   Medication Sig Start Date End Date Taking? Authorizing Provider  ALPRAZolam Duanne Moron) 0.5 MG tablet Take 0.5 mg by mouth 2 (two) times daily as needed for anxiety.    [provider]  bisoprolol (ZEBETA) 5 MG tablet Take 5 mg by mouth daily.    [provider]  CAPLYTA 42 MG capsule Take 42 mg by mouth daily. 02/17/22   [provider]  cetirizine (ZYRTEC) 10 MG tablet Take 10 mg by mouth daily.      [provider]  cyclobenzaprine (FLEXERIL) 10 MG tablet Take 10 mg by mouth 3 (three) times daily as needed for muscle spasms.    [provider]  diltiazem (CARDIZEM CD) 120 MG 24 hr capsule Take 1 capsule (120 mg total) by mouth daily. 04/01/18   Barton Dubois, MD  FARXIGA 10 MG TABS tablet Take 10 mg by mouth daily. 01/21/21   [provider]  levothyroxine (SYNTHROID, LEVOTHROID) 50 MCG tablet Take 50 mcg by mouth daily before breakfast.     [provider]  mometasone (NASONEX) 50 MCG/ACT nasal spray Place 2 sprays into the  nose at bedtime.    [provider]  Multiple Vitamin (MULTIVITAMIN WITH MINERALS) TABS tablet Take 1 tablet by mouth daily.    [provider]  omeprazole (PRILOSEC) 20 MG capsule Take 20 mg by mouth daily.    [provider]  OXYGEN Inhale 2 L into the lungs daily.     [provider]  potassium chloride SA (KLOR-CON) 20 MEQ tablet Take 40 mEq by mouth 2 (two) times daily.    [provider]  rOPINIRole (REQUIP) 3 MG tablet Take 3 mg by mouth 2 (two) times daily.    [provider]  rosuvastatin (CRESTOR) 20 MG tablet TAKE ONE (1) TABLET BY MOUTH EVERY DAY 05/27/22   Satira Sark, MD  torsemide (DEMADEX) 100 MG tablet Take 100 mg by mouth daily.    [provider]  warfarin (COUMADIN) 2 MG tablet Take 1.5-2 tablets (3-4 mg total) by mouth See admin instructions. '4mg'$  on Tuesday and 3 mg daily the rest of the week. 09/06/18   Barton Dubois, MD      Allergies    Penicillins    Review of Systems   Review of Systems  Constitutional:  Negative for chills and fever.  HENT:  Negative for ear pain and sore throat.   Eyes:  Negative for pain and visual disturbance.  Respiratory:  Negative for cough  and shortness of breath.   Cardiovascular:  Negative for chest pain and palpitations.  Gastrointestinal:  Negative for abdominal pain and vomiting.  Genitourinary:  Negative for dysuria and hematuria.  Musculoskeletal:  Negative for arthralgias and back pain.  Skin:  Negative for color change and rash.  Neurological:  Negative for seizures and syncope.  All other systems reviewed and are negative.   Physical Exam Updated Vital Signs BP 101/64 (BP Location: Right Arm)   Pulse 61   Temp 98.1 F (36.7 C) (Oral)   Resp 18   Ht '5\' 8"'$  (1.727 m)   Wt 70.3 kg   SpO2 91%   BMI 23.57 kg/m  Physical Exam Vitals and nursing note reviewed.  Constitutional:      General: She is not in acute distress.    Appearance: Normal  appearance.  HENT:     Head: Normocephalic and atraumatic.     Mouth/Throat:     Mouth: Mucous membranes are moist.  Eyes:     Conjunctiva/sclera: Conjunctivae normal.  Cardiovascular:     Rate and Rhythm: Normal rate and regular rhythm.     Heart sounds: No murmur heard. Pulmonary:     Effort: Pulmonary effort is normal.     Breath sounds: Normal breath sounds.  Abdominal:     Palpations: Abdomen is soft.  Musculoskeletal:        General: Normal range of motion.     Cervical back: Neck supple.  Skin:    General: Skin is warm and dry.     Capillary Refill: Capillary refill takes less than 2 seconds.     Comments: Small superficial abrasion to the helix of the left ear with consistent oozing of blood, no suturable laceration, and no other injury to the outside of the ER or the ear canal  Neurological:     Mental Status: She is alert. Mental status is at baseline.  Psychiatric:        Behavior: Behavior normal.     ED Results / Procedures / Treatments   Labs (all labs ordered are listed, but only abnormal results are displayed) Labs Reviewed - No data to display  EKG None  Radiology No results found.  Procedures Small amount of Dermabond placed to the superficial abrasion of the left ear helix.  Patient instructed not to remove this and to let it fall off on its own to prevent recurrence of bleeding.  Medications Ordered in ED Dermabond  ED Course/ Medical Decision Making/ A&P                           Medical Decision Making  This is a 71 year old female who presents for uncontrolled bleeding to the left ear helix where a small abrasion has been for over 2 weeks.  She denies any direct injury to the head and states that she only scratched the left ear with her nails while clipping them 2 weeks ago resulting in the small cut to the ear.  She is on Coumadin due to a history of an artificial heart valve and today when she reached up to touch her ear she accidentally  knocked off the scab that was covering the abrasion and was unable to stop the bleeding from the ear.  Patient and son do not describe a large amount of blood loss, only the persistent oozing that patient presents with.  Required approximately 20 minutes of direct pressure to the ear to control bleeding  however it did resolve.  Observed patient for approximately half an hour with no recurrence of bleeding.  Applied Dermabond to abrasion and instructed patient to leave this alone in order to keep wound more easily closed.  Given strict return precautions if wound does open up again and patient or son are unable to stop bleeding.         Final Clinical Impression(s) / ED Diagnoses Final diagnoses:  Abrasion of left ear, initial encounter    Rx / DC Orders ED Discharge Orders     None         Turner Daniels 09/17/22 2057    Noemi Chapel, MD 09/19/22 [phone]

## 2022-10-23 ENCOUNTER — Ambulatory Visit: Payer: Medicare Other

## 2022-10-27 ENCOUNTER — Ambulatory Visit: Payer: Medicare Other | Attending: Cardiology | Admitting: *Deleted

## 2022-10-27 DIAGNOSIS — Z952 Presence of prosthetic heart valve: Secondary | ICD-10-CM | POA: Diagnosis not present

## 2022-10-27 DIAGNOSIS — Z5181 Encounter for therapeutic drug level monitoring: Secondary | ICD-10-CM

## 2022-10-27 LAB — POCT INR: INR: 1.7 — AB (ref 2.0–3.0)

## 2022-10-27 NOTE — Patient Instructions (Signed)
Take warfarin extra 1 tablet today then resume 1 tablet daily except 1 1/2 tablets on Sunday, Tuesdays and Thursdays. Recheck in 3 weeks

## 2022-11-17 ENCOUNTER — Ambulatory Visit: Payer: Medicare Other | Attending: Cardiology | Admitting: *Deleted

## 2022-11-17 DIAGNOSIS — Z952 Presence of prosthetic heart valve: Secondary | ICD-10-CM | POA: Diagnosis not present

## 2022-11-17 DIAGNOSIS — Z5181 Encounter for therapeutic drug level monitoring: Secondary | ICD-10-CM | POA: Diagnosis not present

## 2022-11-17 LAB — POCT INR: INR: 1.9 — AB (ref 2.0–3.0)

## 2022-11-17 NOTE — Patient Instructions (Signed)
Take warfarin extra 1/2 tablet today then resume 1 tablet daily except 1 1/2 tablets on Sunday, Tuesdays and Thursdays. Recheck in 4 weeks

## 2022-12-15 ENCOUNTER — Ambulatory Visit: Payer: Medicare Other | Attending: Cardiology | Admitting: Pharmacist

## 2022-12-15 DIAGNOSIS — Z952 Presence of prosthetic heart valve: Secondary | ICD-10-CM | POA: Diagnosis not present

## 2022-12-15 DIAGNOSIS — I482 Chronic atrial fibrillation, unspecified: Secondary | ICD-10-CM

## 2022-12-15 DIAGNOSIS — Z5181 Encounter for therapeutic drug level monitoring: Secondary | ICD-10-CM

## 2022-12-15 LAB — POCT INR: POC INR: 2.6

## 2022-12-15 NOTE — Patient Instructions (Signed)
Continue taking 1 tablet daily except 1 1/2 tablets on Sunday, Tuesdays and Thursdays. Recheck in 4 weeks

## 2023-01-13 ENCOUNTER — Ambulatory Visit: Payer: Medicare Other | Attending: Cardiology | Admitting: *Deleted

## 2023-01-13 DIAGNOSIS — Z5181 Encounter for therapeutic drug level monitoring: Secondary | ICD-10-CM | POA: Diagnosis not present

## 2023-01-13 DIAGNOSIS — Z952 Presence of prosthetic heart valve: Secondary | ICD-10-CM

## 2023-01-13 LAB — POCT INR: INR: 2.4 (ref 2.0–3.0)

## 2023-01-13 NOTE — Patient Instructions (Signed)
Continue taking 1 tablet daily except 1 1/2 tablets on Sunday, Tuesdays and Thursdays. Recheck in 5 weeks

## 2023-02-06 ENCOUNTER — Other Ambulatory Visit: Payer: Self-pay | Admitting: Cardiology

## 2023-02-17 ENCOUNTER — Ambulatory Visit: Payer: Medicare Other | Attending: Cardiology | Admitting: *Deleted

## 2023-02-17 DIAGNOSIS — Z5181 Encounter for therapeutic drug level monitoring: Secondary | ICD-10-CM

## 2023-02-17 DIAGNOSIS — Z952 Presence of prosthetic heart valve: Secondary | ICD-10-CM | POA: Diagnosis not present

## 2023-02-17 LAB — POCT INR: INR: 2.1 (ref 2.0–3.0)

## 2023-02-17 NOTE — Patient Instructions (Signed)
Continue taking 1 tablet daily except 1 1/2 tablets on Sunday, Tuesdays and Thursdays. Recheck in 6 weeks

## 2023-03-29 NOTE — Progress Notes (Signed)
Cardiology Office Note  Date: 03/30/2023   ID: [name], DOB [birth year], MRN [id number]  History of Present Illness: [name] is a 72 y.o. female last seen in November 2023.  She is here for a routine visit.  Reports no significant fluid reaccumulation, weight has been stable.  She continues on relatively high-dose Demadex, states that she was taken off potassium supplement by PCP.  She has not had recent lab work.  Also remains on Farxiga.  She continues on Coumadin with follow-up in the anticoagulation clinic.  Last INR was therapeutic.  She does not report any spontaneous bleeding problems.  I talked with her today about a basic walking plan for exercise.  She did have a follow-up echocardiogram in January of last year at which point aortic prosthesis was functioning normally.  Physical Exam: VS:  BP 116/68   Pulse 64   Ht 5\' 8"  (1.727 m)   Wt 153 lb 6.4 oz (69.6 kg)   SpO2 97%   BMI 23.32 kg/m , BMI Body mass index is 23.32 kg/m.  Wt Readings from Last 3 Encounters:  03/30/23 153 lb 6.4 oz (69.6 kg)  09/17/22 155 lb (70.3 kg)  09/04/22 156 lb 3.2 oz (70.9 kg)    General: Patient appears comfortable at rest. HEENT: Conjunctiva and lids normal. Neck: Supple, no elevated JVP or carotid bruits. Lungs: Clear to auscultation, nonlabored breathing at rest. Cardiac: Irregular, crisp prosthetic valve sound in S2, 1/6 systolic murmur. Extremities: No pitting edema.  ECG:  An ECG dated 09/04/2022 was personally reviewed today and demonstrated:  Rate controlled atrial fibrillation with nonspecific ST-T wave abnormalities.  Labwork:  August 2023: Hemoglobin 9.4, platelets 160, BUN 21, creatinine 1.77, potassium 3.6, AST 27, ALT 11, EGFR 31, cholesterol 115, glycerides 84, HDL 37, LDL 61, TSH 2, BNP 570   Other Studies Reviewed Today:  Echocardiogram 10/31/2021:  1. Left ventricular ejection fraction, by estimation, is 55 to 60%. The  left ventricle has normal  function. The left ventricle has no regional  wall motion abnormalities. Left ventricular diastolic parameters are  indeterminate.   2. Right ventricular systolic function mild to moderately reduced. The  right ventricular size is mildly enlarged.   3. Left atrial size was severely dilated.   4. Right atrial size was mildly dilated.   5. The mitral valve is abnormal. Mild mitral valve regurgitation. No  evidence of mitral stenosis. Moderate mitral annular calcification.   6. The tricuspid valve is abnormal.   7. 25 mm mechanical St Jude valve is in the AV position. . The aortic  valve has been repaired/replaced. Aortic valve regurgitation is not  visualized. No aortic stenosis is present.   8. The inferior vena cava is normal in size with greater than 50%  respiratory variability, suggesting right atrial pressure of 3 mmHg.   Assessment and Plan:  1.  History of aortic stenosis status post 25 mm St. Jude mechanical AVR in 2007 with 30 mm Hemashield tube graft repair of ascending aortic aneurysm.  Follow-up echocardiogram in January 2023 revealed LVEF 55 to 60%, stable mechanical AVR with no significant aortic regurgitation and a mean AV gradient of 7 mmHg.  She is symptomatically stable and continues on Coumadin with follow-up in the anticoagulation clinic.  2.  HFpEF, LVEF 55 to 60%.  Weight has been stable with no significant fluid reaccumulation.  She remains on [place] and Demadex.  Check BMET, she has not been on potassium supplement (discontinued by  PCP by her report).  3.  Multivessel CAD status post CABG in 2007 with RIMA to RCA and SVG to RCA.  She does not describe any active angina.  Continue Zebeta and Crestor.  4.  CKD stage IIIb.  Last creatinine 1.77 in August 2023.  Recheck BMET.  5.  Permanent atrial fibrillation with CHA2DS2-VASc score of 4.  She remains on Coumadin with follow-up in anticoagulation clinic.  No palpitations.  Disposition:  Follow up  6  months.  Signed, Jonelle Sidle, M.D., F.A.C.C. [name]

## 2023-03-30 ENCOUNTER — Telehealth: Payer: Self-pay | Admitting: Cardiology

## 2023-03-30 ENCOUNTER — Encounter: Payer: Self-pay | Admitting: Cardiology

## 2023-03-30 ENCOUNTER — Ambulatory Visit: Payer: Medicare Other | Attending: Cardiology | Admitting: *Deleted

## 2023-03-30 ENCOUNTER — Ambulatory Visit: Payer: Medicare Other | Attending: Cardiology | Admitting: Cardiology

## 2023-03-30 VITALS — BP 116/68 | HR 64 | Ht 68.0 in | Wt 153.4 lb

## 2023-03-30 DIAGNOSIS — Z5181 Encounter for therapeutic drug level monitoring: Secondary | ICD-10-CM | POA: Diagnosis not present

## 2023-03-30 DIAGNOSIS — I4821 Permanent atrial fibrillation: Secondary | ICD-10-CM | POA: Diagnosis not present

## 2023-03-30 DIAGNOSIS — I5032 Chronic diastolic (congestive) heart failure: Secondary | ICD-10-CM | POA: Diagnosis not present

## 2023-03-30 DIAGNOSIS — Z952 Presence of prosthetic heart valve: Secondary | ICD-10-CM | POA: Diagnosis not present

## 2023-03-30 DIAGNOSIS — Z79899 Other long term (current) drug therapy: Secondary | ICD-10-CM

## 2023-03-30 DIAGNOSIS — I25119 Atherosclerotic heart disease of native coronary artery with unspecified angina pectoris: Secondary | ICD-10-CM

## 2023-03-30 LAB — POCT INR: INR: 8.3 — AB (ref 2.0–3.0)

## 2023-03-30 NOTE — Telephone Encounter (Signed)
Noted critical lab.  Called pt with coumadin instructions.  See coumadin note from today.

## 2023-03-30 NOTE — Telephone Encounter (Signed)
Rudene Christians at [facility] Lab critical lab  INR 8.32 PT 86.8

## 2023-03-30 NOTE — Telephone Encounter (Signed)
Medication profile updated.

## 2023-03-30 NOTE — Telephone Encounter (Signed)
Patient called to report the nasal spray she is using is Ipratropium bromide

## 2023-03-30 NOTE — Patient Instructions (Addendum)
Medication Instructions:  Your physician recommends that you continue on your current medications as directed. Please refer to the Current Medication list given to you today.  Labwork: BMET today at [facility] or [facility] ([address] [place])  Testing/Procedures: none  Follow-Up: Your physician recommends that you schedule a follow-up appointment in: 6 months  Any Other Special Instructions Will Be Listed Below (If Applicable).  If you need a refill on your cardiac medications before your next appointment, please call your pharmacy.

## 2023-03-30 NOTE — Patient Instructions (Addendum)
POC INR >8.0   Sent pt to [facility] for STAT PT/INR  PT 86.8  INR 8.32 Took azithromycin and cough syrup from possible pneumonia Hold warfarin until repeat INR check on Thursday 6/13 at 10:30am.  Already took warfarin this morning. Pt denies any S/S of Bleeding.  Bleeding and fall precautions discussed with pt and she verbalized understanding. Left detailed message on cell phone and home phone with above instructions.

## 2023-03-31 ENCOUNTER — Encounter: Payer: Self-pay | Admitting: *Deleted

## 2023-04-02 ENCOUNTER — Ambulatory Visit: Payer: Medicare Other | Attending: Cardiology | Admitting: *Deleted

## 2023-04-02 DIAGNOSIS — Z5181 Encounter for therapeutic drug level monitoring: Secondary | ICD-10-CM | POA: Diagnosis not present

## 2023-04-02 DIAGNOSIS — Z952 Presence of prosthetic heart valve: Secondary | ICD-10-CM | POA: Diagnosis not present

## 2023-04-02 LAB — POCT INR: INR: 5 — AB (ref 2.0–3.0)

## 2023-04-02 NOTE — Patient Instructions (Signed)
Hold warfarin today and tomorrow then decrease dose to 2 tablets daily.  Recheck in 1 wk

## 2023-04-09 ENCOUNTER — Ambulatory Visit: Payer: Medicare Other | Attending: Cardiology | Admitting: *Deleted

## 2023-04-09 DIAGNOSIS — Z5181 Encounter for therapeutic drug level monitoring: Secondary | ICD-10-CM | POA: Diagnosis not present

## 2023-04-09 DIAGNOSIS — Z952 Presence of prosthetic heart valve: Secondary | ICD-10-CM | POA: Diagnosis not present

## 2023-04-09 LAB — POCT INR: INR: 3.1 — AB (ref 2.0–3.0)

## 2023-04-09 NOTE — Patient Instructions (Signed)
Decrease warfarin to 1 tablet daily except 1/2 tablet on Fridays.  Recheck in 3 wk

## 2023-04-29 ENCOUNTER — Ambulatory Visit: Payer: Medicare Other | Attending: Cardiology | Admitting: *Deleted

## 2023-04-29 DIAGNOSIS — Z5181 Encounter for therapeutic drug level monitoring: Secondary | ICD-10-CM | POA: Diagnosis not present

## 2023-04-29 DIAGNOSIS — Z952 Presence of prosthetic heart valve: Secondary | ICD-10-CM | POA: Diagnosis not present

## 2023-04-29 LAB — POCT INR: INR: 2.5 (ref 2.0–3.0)

## 2023-04-29 NOTE — Patient Instructions (Signed)
Continue warfarin 1 tablet daily except 1/2 tablet on Fridays.  Recheck in 4 wk

## 2023-05-27 ENCOUNTER — Ambulatory Visit: Payer: Medicare Other | Attending: Cardiology | Admitting: *Deleted

## 2023-05-27 DIAGNOSIS — Z5181 Encounter for therapeutic drug level monitoring: Secondary | ICD-10-CM

## 2023-05-27 DIAGNOSIS — Z952 Presence of prosthetic heart valve: Secondary | ICD-10-CM | POA: Diagnosis not present

## 2023-05-27 LAB — POCT INR: INR: 1.9 — AB (ref 2.0–3.0)

## 2023-05-27 NOTE — Patient Instructions (Signed)
Take warfarin extra 1/2 tablet day then continue 1 tablet daily except 1/2 tablet on Fridays.  Recheck in 4 wk

## 2023-06-11 ENCOUNTER — Emergency Department (HOSPITAL_COMMUNITY): Payer: Medicare Other

## 2023-06-11 ENCOUNTER — Inpatient Hospital Stay (HOSPITAL_COMMUNITY)
Admission: EM | Admit: 2023-06-11 | Discharge: 2023-06-24 | DRG: 488 | Disposition: A | Discharge status: Skilled Nursing Facility | Payer: Medicare Other | Attending: Internal Medicine | Admitting: Internal Medicine

## 2023-06-11 ENCOUNTER — Encounter (HOSPITAL_COMMUNITY): Payer: Self-pay

## 2023-06-11 ENCOUNTER — Other Ambulatory Visit: Payer: Self-pay

## 2023-06-11 DIAGNOSIS — I4821 Permanent atrial fibrillation: Secondary | ICD-10-CM | POA: Diagnosis present

## 2023-06-11 DIAGNOSIS — N1832 Chronic kidney disease, stage 3b: Secondary | ICD-10-CM | POA: Diagnosis present

## 2023-06-11 DIAGNOSIS — F32A Depression, unspecified: Secondary | ICD-10-CM | POA: Diagnosis present

## 2023-06-11 DIAGNOSIS — N183 Chronic kidney disease, stage 3 unspecified: Secondary | ICD-10-CM | POA: Diagnosis present

## 2023-06-11 DIAGNOSIS — S82121D Displaced fracture of lateral condyle of right tibia, subsequent encounter for closed fracture with routine healing: Secondary | ICD-10-CM | POA: Diagnosis not present

## 2023-06-11 DIAGNOSIS — D6832 Hemorrhagic disorder due to extrinsic circulating anticoagulants: Secondary | ICD-10-CM | POA: Diagnosis not present

## 2023-06-11 DIAGNOSIS — Z7901 Long term (current) use of anticoagulants: Secondary | ICD-10-CM | POA: Diagnosis not present

## 2023-06-11 DIAGNOSIS — K2270 Barrett's esophagus without dysplasia: Secondary | ICD-10-CM | POA: Diagnosis present

## 2023-06-11 DIAGNOSIS — I482 Chronic atrial fibrillation, unspecified: Secondary | ICD-10-CM | POA: Diagnosis present

## 2023-06-11 DIAGNOSIS — S83281A Other tear of lateral meniscus, current injury, right knee, initial encounter: Secondary | ICD-10-CM | POA: Diagnosis not present

## 2023-06-11 DIAGNOSIS — J849 Interstitial pulmonary disease, unspecified: Secondary | ICD-10-CM | POA: Diagnosis present

## 2023-06-11 DIAGNOSIS — E785 Hyperlipidemia, unspecified: Secondary | ICD-10-CM | POA: Diagnosis present

## 2023-06-11 DIAGNOSIS — W19XXXA Unspecified fall, initial encounter: Secondary | ICD-10-CM

## 2023-06-11 DIAGNOSIS — E871 Hypo-osmolality and hyponatremia: Secondary | ICD-10-CM | POA: Diagnosis present

## 2023-06-11 DIAGNOSIS — I110 Hypertensive heart disease with heart failure: Secondary | ICD-10-CM | POA: Diagnosis not present

## 2023-06-11 DIAGNOSIS — Z87891 Personal history of nicotine dependence: Secondary | ICD-10-CM

## 2023-06-11 DIAGNOSIS — T45515A Adverse effect of anticoagulants, initial encounter: Secondary | ICD-10-CM | POA: Diagnosis not present

## 2023-06-11 DIAGNOSIS — Z8711 Personal history of peptic ulcer disease: Secondary | ICD-10-CM

## 2023-06-11 DIAGNOSIS — G4730 Sleep apnea, unspecified: Secondary | ICD-10-CM | POA: Diagnosis present

## 2023-06-11 DIAGNOSIS — J4489 Other specified chronic obstructive pulmonary disease: Secondary | ICD-10-CM | POA: Diagnosis present

## 2023-06-11 DIAGNOSIS — Y92239 Unspecified place in hospital as the place of occurrence of the external cause: Secondary | ICD-10-CM | POA: Diagnosis not present

## 2023-06-11 DIAGNOSIS — Z952 Presence of prosthetic heart valve: Secondary | ICD-10-CM | POA: Diagnosis not present

## 2023-06-11 DIAGNOSIS — S2243XA Multiple fractures of ribs, bilateral, initial encounter for closed fracture: Secondary | ICD-10-CM | POA: Diagnosis present

## 2023-06-11 DIAGNOSIS — S2249XA Multiple fractures of ribs, unspecified side, initial encounter for closed fracture: Secondary | ICD-10-CM | POA: Diagnosis present

## 2023-06-11 DIAGNOSIS — D696 Thrombocytopenia, unspecified: Secondary | ICD-10-CM | POA: Diagnosis not present

## 2023-06-11 DIAGNOSIS — Z86010 Personal history of colonic polyps: Secondary | ICD-10-CM

## 2023-06-11 DIAGNOSIS — J9611 Chronic respiratory failure with hypoxia: Secondary | ICD-10-CM | POA: Diagnosis present

## 2023-06-11 DIAGNOSIS — G2581 Restless legs syndrome: Secondary | ICD-10-CM | POA: Diagnosis present

## 2023-06-11 DIAGNOSIS — S2242XA Multiple fractures of ribs, left side, initial encounter for closed fracture: Secondary | ICD-10-CM | POA: Diagnosis not present

## 2023-06-11 DIAGNOSIS — I2510 Atherosclerotic heart disease of native coronary artery without angina pectoris: Secondary | ICD-10-CM | POA: Diagnosis present

## 2023-06-11 DIAGNOSIS — S82101A Unspecified fracture of upper end of right tibia, initial encounter for closed fracture: Secondary | ICD-10-CM

## 2023-06-11 DIAGNOSIS — D62 Acute posthemorrhagic anemia: Secondary | ICD-10-CM | POA: Diagnosis not present

## 2023-06-11 DIAGNOSIS — Z66 Do not resuscitate: Secondary | ICD-10-CM | POA: Diagnosis present

## 2023-06-11 DIAGNOSIS — R54 Age-related physical debility: Secondary | ICD-10-CM | POA: Diagnosis present

## 2023-06-11 DIAGNOSIS — Z9981 Dependence on supplemental oxygen: Secondary | ICD-10-CM

## 2023-06-11 DIAGNOSIS — E039 Hypothyroidism, unspecified: Secondary | ICD-10-CM | POA: Diagnosis present

## 2023-06-11 DIAGNOSIS — Z823 Family history of stroke: Secondary | ICD-10-CM

## 2023-06-11 DIAGNOSIS — I509 Heart failure, unspecified: Secondary | ICD-10-CM

## 2023-06-11 DIAGNOSIS — S82141A Displaced bicondylar fracture of right tibia, initial encounter for closed fracture: Principal | ICD-10-CM | POA: Diagnosis present

## 2023-06-11 DIAGNOSIS — Z8679 Personal history of other diseases of the circulatory system: Secondary | ICD-10-CM

## 2023-06-11 DIAGNOSIS — Z23 Encounter for immunization: Secondary | ICD-10-CM | POA: Diagnosis not present

## 2023-06-11 DIAGNOSIS — W010XXA Fall on same level from slipping, tripping and stumbling without subsequent striking against object, initial encounter: Secondary | ICD-10-CM | POA: Diagnosis present

## 2023-06-11 DIAGNOSIS — I10 Essential (primary) hypertension: Secondary | ICD-10-CM | POA: Diagnosis present

## 2023-06-11 DIAGNOSIS — Z79890 Hormone replacement therapy (postmenopausal): Secondary | ICD-10-CM

## 2023-06-11 DIAGNOSIS — Z90710 Acquired absence of both cervix and uterus: Secondary | ICD-10-CM

## 2023-06-11 DIAGNOSIS — Z79899 Other long term (current) drug therapy: Secondary | ICD-10-CM

## 2023-06-11 DIAGNOSIS — S82201A Unspecified fracture of shaft of right tibia, initial encounter for closed fracture: Secondary | ICD-10-CM | POA: Diagnosis present

## 2023-06-11 DIAGNOSIS — I25708 Atherosclerosis of coronary artery bypass graft(s), unspecified, with other forms of angina pectoris: Secondary | ICD-10-CM | POA: Diagnosis present

## 2023-06-11 DIAGNOSIS — E876 Hypokalemia: Secondary | ICD-10-CM | POA: Diagnosis present

## 2023-06-11 DIAGNOSIS — S82121A Displaced fracture of lateral condyle of right tibia, initial encounter for closed fracture: Secondary | ICD-10-CM | POA: Diagnosis not present

## 2023-06-11 DIAGNOSIS — I5032 Chronic diastolic (congestive) heart failure: Secondary | ICD-10-CM | POA: Diagnosis present

## 2023-06-11 DIAGNOSIS — Z880 Allergy status to penicillin: Secondary | ICD-10-CM

## 2023-06-11 DIAGNOSIS — J449 Chronic obstructive pulmonary disease, unspecified: Secondary | ICD-10-CM | POA: Diagnosis not present

## 2023-06-11 DIAGNOSIS — Z01810 Encounter for preprocedural cardiovascular examination: Secondary | ICD-10-CM | POA: Diagnosis not present

## 2023-06-11 DIAGNOSIS — E875 Hyperkalemia: Secondary | ICD-10-CM | POA: Diagnosis present

## 2023-06-11 DIAGNOSIS — K219 Gastro-esophageal reflux disease without esophagitis: Secondary | ICD-10-CM | POA: Diagnosis present

## 2023-06-11 DIAGNOSIS — S82191D Other fracture of upper end of right tibia, subsequent encounter for closed fracture with routine healing: Secondary | ICD-10-CM | POA: Diagnosis not present

## 2023-06-11 DIAGNOSIS — I130 Hypertensive heart and chronic kidney disease with heart failure and stage 1 through stage 4 chronic kidney disease, or unspecified chronic kidney disease: Secondary | ICD-10-CM | POA: Diagnosis present

## 2023-06-11 DIAGNOSIS — R110 Nausea: Secondary | ICD-10-CM | POA: Diagnosis not present

## 2023-06-11 DIAGNOSIS — F419 Anxiety disorder, unspecified: Secondary | ICD-10-CM | POA: Diagnosis present

## 2023-06-11 DIAGNOSIS — S82191A Other fracture of upper end of right tibia, initial encounter for closed fracture: Secondary | ICD-10-CM | POA: Diagnosis not present

## 2023-06-11 LAB — CBC WITH DIFFERENTIAL/PLATELET
Abs Immature Granulocytes: 0.07 10*3/uL (ref 0.00–0.07)
Basophils Absolute: 0 10*3/uL (ref 0.0–0.1)
Basophils Relative: 0 %
Eosinophils Absolute: 0.1 10*3/uL (ref 0.0–0.5)
Eosinophils Relative: 1 %
HCT: 38.1 % (ref 36.0–46.0)
Hemoglobin: 12.1 g/dL (ref 12.0–15.0)
Immature Granulocytes: 1 %
Lymphocytes Relative: 13 %
Lymphs Abs: 1.4 10*3/uL (ref 0.7–4.0)
MCH: 28.7 pg (ref 26.0–34.0)
MCHC: 31.8 g/dL (ref 30.0–36.0)
MCV: 90.3 fL (ref 80.0–100.0)
Monocytes Absolute: 0.9 10*3/uL (ref 0.1–1.0)
Monocytes Relative: 8 %
Neutro Abs: 8.6 10*3/uL — ABNORMAL HIGH (ref 1.7–7.7)
Neutrophils Relative %: 77 %
Platelets: 212 10*3/uL (ref 150–400)
RBC: 4.22 MIL/uL (ref 3.87–5.11)
RDW: 14.2 % (ref 11.5–15.5)
WBC: 11.1 10*3/uL — ABNORMAL HIGH (ref 4.0–10.5)
nRBC: 0 % (ref 0.0–0.2)

## 2023-06-11 LAB — COMPREHENSIVE METABOLIC PANEL
ALT: 20 U/L (ref 0–44)
AST: 33 U/L (ref 15–41)
Albumin: 4.2 g/dL (ref 3.5–5.0)
Alkaline Phosphatase: 69 U/L (ref 38–126)
Anion gap: 10 (ref 5–15)
BUN: 22 mg/dL (ref 8–23)
CO2: 32 mmol/L (ref 22–32)
Calcium: 9.4 mg/dL (ref 8.9–10.3)
Chloride: 97 mmol/L — ABNORMAL LOW (ref 98–111)
Creatinine, Ser: 1.46 mg/dL — ABNORMAL HIGH (ref 0.44–1.00)
GFR, Estimated: 38 mL/min — ABNORMAL LOW (ref >60)
Glucose, Bld: 95 mg/dL (ref 70–99)
Potassium: 3.1 mmol/L — ABNORMAL LOW (ref 3.5–5.1)
Sodium: 139 mmol/L (ref 135–145)
Total Bilirubin: 0.8 mg/dL (ref 0.3–1.2)
Total Protein: 7.8 g/dL (ref 6.5–8.1)

## 2023-06-11 LAB — URINALYSIS, ROUTINE W REFLEX MICROSCOPIC
Bilirubin Urine: NEGATIVE
Glucose, UA: 150 mg/dL — AB
Hgb urine dipstick: NEGATIVE
Ketones, ur: NEGATIVE mg/dL
Leukocytes,Ua: NEGATIVE
Nitrite: NEGATIVE
Protein, ur: NEGATIVE mg/dL
Specific Gravity, Urine: 1.006 (ref 1.005–1.030)
pH: 6 (ref 5.0–8.0)

## 2023-06-11 LAB — PROTIME-INR
INR: 1.7 — ABNORMAL HIGH (ref 0.8–1.2)
Prothrombin Time: 20.6 seconds — ABNORMAL HIGH (ref 11.4–15.2)

## 2023-06-11 LAB — LIPASE, BLOOD: Lipase: 40 U/L (ref 11–51)

## 2023-06-11 LAB — MAGNESIUM: Magnesium: 2.2 mg/dL (ref 1.7–2.4)

## 2023-06-11 MED ORDER — ALPRAZOLAM 0.5 MG PO TABS
0.5000 mg | ORAL_TABLET | Freq: Two times a day (BID) | ORAL | Status: DC | PRN
  Administered 2023-06-12 – 2023-06-23 (×6): 0.5 mg via ORAL
  Filled 2023-06-11 (×8): qty 1

## 2023-06-11 MED ORDER — MORPHINE SULFATE (PF) 2 MG/ML IV SOLN: 1.0000 mg | INTRAVENOUS | Status: DC | PRN

## 2023-06-11 MED ORDER — FENTANYL CITRATE PF 50 MCG/ML IJ SOSY
25.0000 ug | PREFILLED_SYRINGE | INTRAMUSCULAR | Status: DC | PRN
  Administered 2023-06-12 – 2023-06-23 (×9): 25 ug via INTRAVENOUS
  Filled 2023-06-11 (×9): qty 1

## 2023-06-11 MED ORDER — POTASSIUM CHLORIDE IN NACL 20-0.9 MEQ/L-% IV SOLN: INTRAVENOUS | Status: AC

## 2023-06-11 MED ORDER — ONDANSETRON HCL 4 MG/2ML IJ SOLN
4.0000 mg | Freq: Four times a day (QID) | INTRAMUSCULAR | Status: DC | PRN
  Administered 2023-06-11 – 2023-06-18 (×6): 4 mg via INTRAVENOUS
  Filled 2023-06-11 (×6): qty 2

## 2023-06-11 MED ORDER — ONDANSETRON HCL 4 MG PO TABS: 4.0000 mg | ORAL_TABLET | Freq: Four times a day (QID) | ORAL | Status: DC | PRN

## 2023-06-11 MED ORDER — LUMATEPERONE TOSYLATE 42 MG PO CAPS
42.0000 mg | ORAL_CAPSULE | Freq: Every day | ORAL | Status: DC
  Administered 2023-06-12 – 2023-06-24 (×13): 42 mg via ORAL
  Filled 2023-06-11 (×9): qty 1

## 2023-06-11 MED ORDER — ACETAMINOPHEN 650 MG RE SUPP: 650.0000 mg | Freq: Four times a day (QID) | RECTAL | Status: DC | PRN

## 2023-06-11 MED ORDER — MORPHINE SULFATE (PF) 2 MG/ML IV SOLN
2.0000 mg | INTRAVENOUS | Status: DC | PRN
  Administered 2023-06-11: 2 mg via INTRAVENOUS
  Filled 2023-06-11: qty 1

## 2023-06-11 MED ORDER — POTASSIUM CHLORIDE CRYS ER 20 MEQ PO TBCR
40.0000 meq | EXTENDED_RELEASE_TABLET | ORAL | Status: AC
  Administered 2023-06-11 – 2023-06-12 (×2): 40 meq via ORAL
  Filled 2023-06-11 (×2): qty 2

## 2023-06-11 MED ORDER — FENTANYL CITRATE PF 50 MCG/ML IJ SOSY
25.0000 ug | PREFILLED_SYRINGE | Freq: Once | INTRAMUSCULAR | Status: AC
  Administered 2023-06-11: 25 ug via INTRAVENOUS
  Filled 2023-06-11: qty 1

## 2023-06-11 MED ORDER — PNEUMOCOCCAL 20-VAL CONJ VACC 0.5 ML IM SUSY
0.5000 mL | PREFILLED_SYRINGE | INTRAMUSCULAR | Status: AC
  Administered 2023-06-12: 0.5 mL via INTRAMUSCULAR
  Filled 2023-06-11: qty 0.5

## 2023-06-11 MED ORDER — ACETAMINOPHEN 325 MG PO TABS
650.0000 mg | ORAL_TABLET | Freq: Four times a day (QID) | ORAL | Status: DC | PRN
  Administered 2023-06-12: 650 mg via ORAL
  Filled 2023-06-11 (×2): qty 2

## 2023-06-11 MED ORDER — ROPINIROLE HCL 1 MG PO TABS
3.0000 mg | ORAL_TABLET | Freq: Two times a day (BID) | ORAL | Status: DC
  Administered 2023-06-12 – 2023-06-24 (×25): 3 mg via ORAL
  Filled 2023-06-11 (×26): qty 3

## 2023-06-11 MED ORDER — POLYETHYLENE GLYCOL 3350 17 G PO PACK: 17.0000 g | PACK | Freq: Every day | ORAL | Status: DC | PRN

## 2023-06-11 MED ORDER — LEVOTHYROXINE SODIUM 50 MCG PO TABS
50.0000 ug | ORAL_TABLET | Freq: Every day | ORAL | Status: DC
  Administered 2023-06-13 – 2023-06-24 (×11): 50 ug via ORAL
  Filled 2023-06-11 (×12): qty 1

## 2023-06-11 NOTE — ED Provider Notes (Signed)
[place] EMERGENCY DEPARTMENT AT [facility] Provider Note   CSN: [id number] Arrival date & time: 06/11/23  1323     History  Chief Complaint  Patient presents with   [name] is a 72 y.o. female.   Fall Associated symptoms include chest pain (Left rib pain). Pertinent negatives include no abdominal pain, no headaches and no shortness of breath.       [name] is a 73 y.o. female with past medical history of atrial fibrillation, asthma, coronary atherosclerosis, GERD, chronic heart failure, mechanical heart valve, chronically anticoagulated with Coumadin who presents to the Emergency Department complaining of pain of her left ribs and right knee secondary to a fall that occurred shortly before ER arrival.  She states she was standing on a counter cleaning a shelf when she fell landing on her left side.  She states that her right leg struck the commode in the bathroom.  She has pain with movement of her right knee, deep breathing or movement of her chest.  She denies feeling short of breath, head or neck pain, hip pain. She wears 2 L oxygen by nasal cannula at night and as needed  Home Medications Prior to Admission medications   Medication Sig Start Date End Date Taking? Authorizing Provider  ALPRAZolam Prudy Feeler) 0.5 MG tablet Take 0.5 mg by mouth 2 (two) times daily as needed for anxiety.    [provider]  bisoprolol (ZEBETA) 5 MG tablet Take 5 mg by mouth daily.    [provider]  CAPLYTA 42 MG capsule Take 42 mg by mouth daily. 02/17/22   [provider]  cyclobenzaprine (FLEXERIL) 10 MG tablet Take 10 mg by mouth 3 (three) times daily as needed for muscle spasms.    [provider]  diltiazem (CARDIZEM CD) 120 MG 24 hr capsule Take 1 capsule (120 mg total) by mouth daily. 04/01/18   Vassie Loll, MD  FARXIGA 10 MG TABS tablet Take 10 mg by mouth daily. 01/21/21   [provider]  ipratropium  (ATROVENT) 0.03 % nasal spray Place 2 sprays into both nostrils every 12 (twelve) hours.    [provider]  levothyroxine (SYNTHROID, LEVOTHROID) 50 MCG tablet Take 50 mcg by mouth daily before breakfast.     [provider]  mometasone (NASONEX) 50 MCG/ACT nasal spray Place 2 sprays into the nose at bedtime.    [provider]  Multiple Vitamin (MULTIVITAMIN WITH MINERALS) TABS tablet Take 1 tablet by mouth daily.    [provider]  omeprazole (PRILOSEC) 20 MG capsule Take 20 mg by mouth daily.    [provider]  OXYGEN Inhale 2 L into the lungs daily.     [provider]  rOPINIRole (REQUIP) 3 MG tablet Take 3 mg by mouth 2 (two) times daily.    [provider]  rosuvastatin (CRESTOR) 20 MG tablet TAKE ONE (1) TABLET BY MOUTH EVERY DAY 02/06/23   Jonelle Sidle, MD  torsemide (DEMADEX) 100 MG tablet Take 100 mg by mouth daily.    [provider]  warfarin (COUMADIN) 2 MG tablet Take 1.5-2 tablets (3-4 mg total) by mouth See admin instructions. 4mg  on Tuesday and 3 mg daily the rest of the week. 09/06/18   Vassie Loll, MD      Allergies    Penicillins    Review of Systems   Review of Systems  Constitutional:  Negative for appetite change and chills.  Eyes:  Negative for visual disturbance.  Respiratory:  Negative for shortness of breath.   Cardiovascular:  Positive for chest pain (Left rib pain). Negative for leg swelling.  Gastrointestinal:  Negative for abdominal pain, nausea and vomiting.  Genitourinary:  Negative for flank pain.  Musculoskeletal:  Positive for arthralgias (Right knee pain) and joint swelling. Negative for neck pain.  Skin:  Negative for wound.  Neurological:  Negative for dizziness, syncope, weakness, numbness and headaches.  Psychiatric/Behavioral:  Negative for confusion.     Physical Exam Updated Vital Signs BP 116/75   Pulse 68   Temp 98.9 F (37.2 C)   Resp (!) 23   Ht 5\' 8"   (1.727 m)   Wt 69.6 kg   SpO2 93%   BMI 23.33 kg/m  Physical Exam Vitals and nursing note reviewed.  Constitutional:      Appearance: She is not toxic-appearing.     Comments: Patient is uncomfortable appearing guarding left chest  HENT:     Head: Atraumatic.     Mouth/Throat:     Mouth: Mucous membranes are moist.     Pharynx: Oropharynx is clear.  Eyes:     Extraocular Movements: Extraocular movements intact.     Conjunctiva/sclera: Conjunctivae normal.     Pupils: Pupils are equal, round, and reactive to light.  Cardiovascular:     Rate and Rhythm: Normal rate. Rhythm irregular.     Pulses: Normal pulses.  Pulmonary:     Effort: Pulmonary effort is normal.     Comments: Diffuse tenderness anterior to lateral left chest wall.  Some bony crepitus on exam.  She is guarding left chest.  No open wound.  Increased work of breathing, patient's son is at bedside and states her breathing appears baseline Chest:     Chest wall: Tenderness present.  Abdominal:     Palpations: Abdomen is soft.     Tenderness: There is no abdominal tenderness.  Musculoskeletal:        General: Tenderness and signs of injury present.     Cervical back: Full passive range of motion without pain and normal range of motion. No tenderness.     Comments: Diffuse tenderness and edema of the right anterior knee.  No obvious bony deformity.  Significant pain with attempted range of motion right hip and ankle nontender  Skin:    General: Skin is warm.     Capillary Refill: Capillary refill takes less than 2 seconds.  Neurological:     General: No focal deficit present.     Mental Status: She is alert.     Sensory: No sensory deficit.     Motor: No weakness.     ED Results / Procedures / Treatments   Labs (all labs ordered are listed, but only abnormal results are displayed) Labs Reviewed  PROTIME-INR - Abnormal; Notable for the following components:      Result Value   Prothrombin Time 20.6 (*)    INR  1.7 (*)    All other components within normal limits  CBC WITH DIFFERENTIAL/PLATELET - Abnormal; Notable for the following components:   WBC 11.1 (*)    Neutro Abs 8.6 (*)    All other components within normal limits  COMPREHENSIVE METABOLIC PANEL - Abnormal; Notable for the following components:   Potassium 3.1 (*)    Chloride 97 (*)    Creatinine, Ser 1.46 (*)    GFR, Estimated 38 (*)    All other components within normal limits  URINALYSIS,  ROUTINE W REFLEX MICROSCOPIC - Abnormal; Notable for the following components:   Color, Urine STRAW (*)    Glucose, UA 150 (*)    All other components within normal limits  LIPASE, BLOOD    EKG EKG Interpretation Date/Time:  Thursday June 11 2023 14:26:24 EDT Ventricular Rate:  58 PR Interval:    QRS Duration:  95 QT Interval:  498 QTC Calculation: 490 R Axis:   72  Text Interpretation: Atrial fibrillation Repol abnrm suggests ischemia, diffuse leads No significant change since prior 8/20 Confirmed by Meridee Score [phone]) on 06/11/2023 2:36:08 PM  Radiology CT CHEST ABDOMEN PELVIS WO CONTRAST  Result Date: 06/11/2023 CLINICAL DATA:  Trauma.  Hit left ribs. EXAM: CT CHEST, ABDOMEN AND PELVIS WITHOUT CONTRAST TECHNIQUE: Multidetector CT imaging of the chest, abdomen and pelvis was performed following the standard protocol without IV contrast. RADIATION DOSE REDUCTION: This exam was performed according to the departmental dose-optimization program which includes automated exposure control, adjustment of the mA and/or kV according to patient size and/or use of iterative reconstruction technique. COMPARISON:  CT abdomen and pelvis 04/20/2013 FINDINGS: CT CHEST FINDINGS Cardiovascular: The aorta is ectatic. Bovine arch anatomy is noted. There are atherosclerotic calcifications of the aorta and coronary arteries. The heart is enlarged. There is no pericardial effusion. Patient is status post cardiac surgery. Mediastinum/Nodes: No enlarged  mediastinal, hilar, or axillary lymph nodes. Thyroid gland, trachea, and esophagus demonstrate no significant findings. There is no pneumomediastinum or mediastinal hematoma. Lungs/Pleura: There some scattered peripheral reticular opacities throughout the lungs, greater on the right. There is no focal lung consolidation, pleural effusion or pneumothorax. Musculoskeletal: There are nondisplaced acute left anterior fourth, fifth, sixth and seventh rib fractures. There are additional healed bilateral rib fractures. Sternotomy wires are present. CT ABDOMEN PELVIS FINDINGS Hepatobiliary: No focal liver abnormality is seen. No gallstones, gallbladder wall thickening, or biliary dilatation. Pancreas: Unremarkable. No pancreatic ductal dilatation or surrounding inflammatory changes. Spleen: Normal in size without focal abnormality. Adrenals/Urinary Tract: Adrenal glands are unremarkable. There is no hydronephrosis or urinary tract calculus. Cortical cysts are seen in both kidneys measuring 1 cm or less. Bladder is unremarkable. Stomach/Bowel: Multiple bowel anastomoses are present. The appendix is not seen. There is no focal inflammation, pneumatosis or free air. There is no evidence for bowel obstruction. The stomach is within normal limits. Colonic diverticula are present. Vascular/Lymphatic: Aortic atherosclerosis. No enlarged abdominal or pelvic lymph nodes. Reproductive: Status post hysterectomy. No adnexal masses. Other: No abdominal wall hernia or abnormality. No abdominopelvic ascites. Musculoskeletal: The bones are osteopenic. There is moderate compression deformity of L2 which is new from 2014. This is favored as chronic, but age indeterminate. There is mild retropulsion of fracture fragments without significant central spinal canal stenosis. IMPRESSION: 1. Acute nondisplaced left anterior fourth through seventh rib fractures. No pneumothorax. 2. Moderate compression deformity of L2 is new from 2014. This is  favored as chronic, but age indeterminate. 3. No acute localizing process in the abdomen or pelvis. 4. Scattered peripheral reticular opacities throughout the lungs, greater on the right, may represent chronic interstitial lung disease. 5. Cardiomegaly. 6. Bosniak I benign renal cyst measuring 1 cm. No follow-up imaging is recommended. JACR 2018 Feb; 264-273, Management of the Incidental Renal Mass on CT, [employer] 2021; 814-848, Bosniak Classification of Cystic Renal Masses, Version 2019. Aortic Atherosclerosis (ICD10-I70.0). Electronically Signed   By: Darliss Cheney M.D.   On: 06/11/2023 16:45   CT Head Wo Contrast  Result Date: 06/11/2023 CLINICAL DATA:  Polytrauma,  blunt fall EXAM: CT HEAD WITHOUT CONTRAST CT CERVICAL SPINE WITHOUT CONTRAST TECHNIQUE: Multidetector CT imaging of the head and cervical spine was performed following the standard protocol without intravenous contrast. Multiplanar CT image reconstructions of the cervical spine were also generated. RADIATION DOSE REDUCTION: This exam was performed according to the departmental dose-optimization program which includes automated exposure control, adjustment of the mA and/or kV according to patient size and/or use of iterative reconstruction technique. COMPARISON:  None Available. FINDINGS: CT HEAD FINDINGS Brain: No evidence of acute infarction, hemorrhage, hydrocephalus, extra-axial collection or mass lesion/mass effect. Scattered low-density changes within the periventricular and subcortical white matter most compatible with chronic microvascular ischemic change. Mild diffuse cerebral volume loss. Vascular: Atherosclerotic calcifications involving the large vessels of the skull base. No unexpected hyperdense vessel. Skull: Normal. Negative for fracture or focal lesion. Sinuses/Orbits: No acute finding. Other: Negative for scalp hematoma. CT CERVICAL SPINE FINDINGS Alignment: Facet joints are aligned without dislocation or traumatic listhesis.  Dens and lateral masses are aligned. Skull base and vertebrae: No acute fracture. No primary bone lesion or focal pathologic process. Soft tissues and spinal canal: No prevertebral fluid or swelling. No visible canal hematoma. Disc levels: Disc heights are relatively well preserved. Minor facet spurring. Upper chest: See dedicated CT chest, abdomen, pelvis for full assessment of the intrathoracic findings. Other: Bilateral carotid atherosclerosis. IMPRESSION: 1. No acute intracranial abnormality. 2. No acute fracture or subluxation of the cervical spine. Electronically Signed   By: Duanne Guess D.O.   On: 06/11/2023 16:37   CT Cervical Spine Wo Contrast  Result Date: 06/11/2023 CLINICAL DATA:  Polytrauma, blunt fall EXAM: CT HEAD WITHOUT CONTRAST CT CERVICAL SPINE WITHOUT CONTRAST TECHNIQUE: Multidetector CT imaging of the head and cervical spine was performed following the standard protocol without intravenous contrast. Multiplanar CT image reconstructions of the cervical spine were also generated. RADIATION DOSE REDUCTION: This exam was performed according to the departmental dose-optimization program which includes automated exposure control, adjustment of the mA and/or kV according to patient size and/or use of iterative reconstruction technique. COMPARISON:  None Available. FINDINGS: CT HEAD FINDINGS Brain: No evidence of acute infarction, hemorrhage, hydrocephalus, extra-axial collection or mass lesion/mass effect. Scattered low-density changes within the periventricular and subcortical white matter most compatible with chronic microvascular ischemic change. Mild diffuse cerebral volume loss. Vascular: Atherosclerotic calcifications involving the large vessels of the skull base. No unexpected hyperdense vessel. Skull: Normal. Negative for fracture or focal lesion. Sinuses/Orbits: No acute finding. Other: Negative for scalp hematoma. CT CERVICAL SPINE FINDINGS Alignment: Facet joints are aligned without  dislocation or traumatic listhesis. Dens and lateral masses are aligned. Skull base and vertebrae: No acute fracture. No primary bone lesion or focal pathologic process. Soft tissues and spinal canal: No prevertebral fluid or swelling. No visible canal hematoma. Disc levels: Disc heights are relatively well preserved. Minor facet spurring. Upper chest: See dedicated CT chest, abdomen, pelvis for full assessment of the intrathoracic findings. Other: Bilateral carotid atherosclerosis. IMPRESSION: 1. No acute intracranial abnormality. 2. No acute fracture or subluxation of the cervical spine. Electronically Signed   By: Duanne Guess D.O.   On: 06/11/2023 16:37   DG Knee Complete 4 Views Right  Result Date: 06/11/2023 CLINICAL DATA:  Fall.  Right knee pain. EXAM: RIGHT KNEE - COMPLETE 4+ VIEW COMPARISON:  None Available. FINDINGS: There is diffuse decreased bone mineralization. There appears to be 1 mm lucent cortical defect within the central aspect of the lateral tibial plateau. On oblique view there is  a more distinct 4 mm craniocaudal depth fracture line lucency in this region at the junction of the lateral tibial spine and lateral tibial plateau. There is up to approximately 4 mm depression of the more lateral aspect of the lateral tibial plateau with additional mild subcortical lucency suspicious for an acute fracture. Moderate distal lateral tibial plateau degenerative osteophytosis. Moderate patellofemoral joint space narrowing and peripheral osteophytosis. Tiny joint effusion. Surgical clips overlie the medial knee soft tissues. IMPRESSION: 1. Acute fracture of the junction of the lateral tibial spine and lateral tibial plateau. There is up to 4 mm depression of the more lateral aspect of lateral tibial plateau which may be acute or chronic. There do appear to be some subcortical acute fracture lines in this region of the lateral aspect of the lateral tibial plateau. 2. Moderate patellofemoral  osteoarthritis. Electronically Signed   By: Neita Garnet M.D.   On: 06/11/2023 16:32   DG Chest 2 View  Result Date: 06/11/2023 CLINICAL DATA:  Rib pain, fall left-sided rib pain under the breast with inspiration EXAM: CHEST - 2 VIEW COMPARISON:  05/28/2019, CT 07/13/2019 FINDINGS: Post sternotomy changes and valve prosthesis. Enlarged cardiomediastinal silhouette with probable mild vascular congestion. Mild chronic pleural thickening on the right. Age indeterminate left lower ninth and tenth anterolateral rib deformities. Chronic upper lumbar compression deformity. IMPRESSION: 1. Age indeterminate left lower ninth and tenth anterolateral rib deformities. 2. Enlarged cardiomediastinal silhouette with probable mild vascular congestion. Electronically Signed   By: Jasmine Pang M.D.   On: 06/11/2023 15:55    Procedures Procedures    Medications Ordered in ED Medications  fentaNYL (SUBLIMAZE) injection 25 mcg (25 mcg Intravenous Given 06/11/23 1544)  fentaNYL (SUBLIMAZE) injection 25 mcg (25 mcg Intravenous Given 06/11/23 1631)    ED Course/ Medical Decision Making/ A&P Clinical Course as of 06/11/23 1748  Thu Jun 11, 2023  [id number] 72 year old female here for evaluation of injuries after a fall.  She struck her left chest along with her right knee.  No loss of consciousness.  Getting some imaging and pain control.  Disposition per results of testing. [MB]    Clinical Course User Index [MB] Terrilee Files, MD                                 Medical Decision Making Patient here for evaluation of injury sustained in mechanical fall from earlier today.  States she was standing on a counter cleaning when she fell landing on her left side.  She denies head injury or LOC.  She is anticoagulated on Coumadin secondary to mechanical heart valve.  She has pain of her left rib area and right knee  I suspect patient has multiple rib fractures.  No open wounds pneumothorax also considered.  I do hear lung  sounds on the left.  She has diffuse tenderness of the right knee and grimaces with any attempted movement.  Extremity is neurovascularly intact.  Exam is concerning for possible fracture  Amount and/or Complexity of Data Reviewed Labs: ordered.    Details: Labs interpreted by me, no significant leukocytosis, hemoglobin unremarkable, INR 1.7.  Chemistries serum creatinine 1.46, improved from June 2024 Radiology: ordered.    Details: CT chest abdomen pelvis shows fractures of the 4th through 7th left ribs, no pneumothorax.  Moderate compression deformity L2 new from 2014 but favored as chronic.  CT head and C-spine without acute intracranial findings subluxation or fracture  X-ray of the knee shows tibial plateau fracture ECG/medicine tests: ordered.    Details: EKG shows atrial fibrillation, repull abnormality suggest ischemia, no significant change since prior Discussion of management or test interpretation with external provider(s): Patient with mechanical fall while standing on a counter.  Has 4 left-sided rib fractures without CT evidence of pneumothorax.  She also has tibial plateau fracture right knee.  She is required IV pain medication here.  Sats have been in the upper 80s to low 90s.  Currently on 2 L O2 sats 91 to 93%.  She does use 2 L nasal oxygen at night at baseline.  I feel patient would benefit from hospital admission for observation and pain control.  Discussed x-ray findings of the right knee with orthopedics, Dr. Dallas Schimke, who request CT of the knee and he will see patient in consultation. Will further discuss with hospitalist the holding of her Coumadin once he has reviewed all her imaging.  Discussed with [facility] hospitalist, Dr. Vanice Sarah who agrees to admit.     Risk Prescription drug management.           Final Clinical Impression(s) / ED Diagnoses Final diagnoses:  None    Rx / DC Orders ED Discharge Orders     None         Rosey Bath 06/11/23 1848    Terrilee Files, MD 06/12/23 0700

## 2023-06-11 NOTE — Assessment & Plan Note (Addendum)
-  Status post mechanical fall.  Chest CT shows acute nondisplaced left anterior 4th through 7th rib fractures. -Continue incentive spirometer -Patient advised to take to breath despite discomfort to minimize atelectasis/pneumonia -Continue oxygen supplementation and wean off as tolerated.

## 2023-06-11 NOTE — Assessment & Plan Note (Addendum)
-  Blood pressure is currently soft -Continue to follow vital signs. -Holding antihypertensive agents at the moment.

## 2023-06-11 NOTE — Assessment & Plan Note (Addendum)
Continue Synthroid °

## 2023-06-11 NOTE — Plan of Care (Signed)
  Problem: Education: Goal: Knowledge of General Education information will improve Description: Including pain rating scale, medication(s)/side effects and non-pharmacologic comfort measures Outcome: Progressing   Problem: Clinical Measurements: Goal: Ability to maintain clinical measurements within normal limits will improve Outcome: Progressing Goal: Will remain free from infection Outcome: Progressing   

## 2023-06-11 NOTE — ED Triage Notes (Signed)
Pt brought in from falling this morning hitting her left side from the ribs down to her knee. Pt takes blood thinners cumadin. Pt reports not passing out or hitting her head.

## 2023-06-11 NOTE — Assessment & Plan Note (Addendum)
On warfarin for atrial fibrillation. -Continue holding bisoprolol and diltiazem in the setting of soft blood pressure.

## 2023-06-11 NOTE — Assessment & Plan Note (Addendum)
-  CT pelvis negative. - Dr. Dallas Schimke has seen patient in consultation and there is no plan for surgical intervention at the moment.  Will continue to follow recommendations. -Will advance diet; continue as needed analgesics, good hydration, the use of incentive spirometer and follow clinical response.

## 2023-06-11 NOTE — Assessment & Plan Note (Addendum)
-  On chronic anticoagulation with warfarin.  INR subtherapeutic at 1.7. -Order for Coumadin/INR per pharmacy.

## 2023-06-11 NOTE — Assessment & Plan Note (Addendum)
-  CKD 3B.   -For the most part appears to be stable -Continue to maintain adequate hydration -Continue to follow renal function trend.

## 2023-06-11 NOTE — Assessment & Plan Note (Addendum)
-  Repleted and within normal limits currently -Magnesium 2.2. -Continue to follow ultralights trend.

## 2023-06-11 NOTE — H&P (Addendum)
History and Physical    [name] ZOX:[id number] DOB: [birth year] DOA: 06/11/2023  PCP: Amy Chesterfield, NP   Patient coming from: Home  I have personally briefly reviewed patient's old medical records in [facility]  Chief Complaint: Fall  HPI: [name] is a 72 y.o. female with medical history significant for diastolic CHF, chronic respiratory failure on 2 L, atrial fibrillation, COPD. Patient presented to the ED with complaints of a fall with left-sided chest and knee pain.  Patient got up onto a counter to clean when she lost her balance and fell in the bathroom, hitting the left side of her chest and her right leg following into the commode.  It took about 30 minutes to crawl to her phone to call her son.  She denies any chest pain, no dizziness, no change in breathing.  He is on warfarin and took her last dose this morning.  She is also on diuretics-torsemide  ED Course: O2 sats ranging from 88 to 98% on 2 L.  Blood pressure systolic 99-132. K- 3.1.  UA not suggestive of infection. CT chest abdomen and pelvis without contrast-acute nondisplaced left anterior 4th through 7th rib fractures.  Moderate compression deformity of L2 new from 2014, favored as chronic, chronic interstitial lung disease. EDP talked to surgeon-Dr. Veneda Mendez CT of the right knee, will see in consult. CT knee-comminuted displaced fracture involving the lateral tibial plateau, step-off of the level of the joint line approaches 2 mm. IV fentanyl ordered.  Review of Systems: As per HPI all other systems reviewed and negative.  Past Medical History:  Diagnosis Date   Anxiety disorder    Aortic stenosis    Status post St. Jude mechanical AVR 2007   Asthma    Atrial fibrillation (HCC)    Carcinoid tumor of colon 2007   Chronic diastolic heart failure (HCC)    Coronary atherosclerosis of native coronary artery    Status post CABG 2007   GERD (gastroesophageal reflux disease)    History  of colonoscopy 2003   [name] - normal   Hyperlipidemia    Macromastia    Peptic stricture of esophagus 10/04/2010   GE junction on last EGD by [name], benign biopsies   RLS (restless legs syndrome)    Schatzki's ring     Past Surgical History:  Procedure Laterality Date   ABDOMINAL HYSTERECTOMY     AORTIC VALVE REPLACEMENT  2007   #25 mm St. Jude mechanical prosthesis with Hemashield tube graft repair of ascending aneurysm   APPENDECTOMY  2007   BACK SURGERY     lumbar 4 and 5    BIOPSY  02/05/2012   RMR:Two tongues of salmon-colored epithelium distal esophagus, very short-segment Barrett's s/p bx/Small hiatal hernia, otherwise normal stomach, D1, D2. Status post esophageal dilation. Biopsy showed GERD.   Breast cyst removed     bilateral   BREAST REDUCTION SURGERY     CESAREAN SECTION     COLON SURGERY  01/2006   Secondary ? Appendiceal carcinoid   COLONOSCOPY  02/05/2012   WJX:BJYNWG rectum, sigmoid diverticulosis,descending colon polyp , tubular adenoma   CORONARY ARTERY BYPASS GRAFT     06/2006 - RIMA to RCA, SVG to RCA   ESOPHAGOGASTRODUODENOSCOPY  10/03/2010   [name]- schatzki's ring, shoft peptic stricture at GE junction.   ESOPHAGOGASTRODUODENOSCOPY (EGD) WITH PROPOFOL N/A 02/08/2018   Procedure: ESOPHAGOGASTRODUODENOSCOPY (EGD) WITH PROPOFOL;  Surgeon: Amy Ade, MD;  Location: [facility];  Service: Endoscopy;  Laterality: N/A;  8:15am   FOOT SURGERY     bilateral bunionectomy   HERNIA REPAIR     with mesh   LAPAROTOMY  2007   small bowel resection secondary to small bowel obstruction   MALONEY DILATION  02/05/2012   Procedure: [name] DILATION;  Surgeon: Amy Ade, MD;  Location: [facility];  Service: Endoscopy;  Laterality: N/A;  56mm    MALONEY DILATION N/A 02/08/2018   Procedure: [name] DILATION;  Surgeon: Amy Ade, MD;  Location: [facility];  Service: Endoscopy;  Laterality: N/A;   Teeth removal       reports that she quit smoking  about 34 years ago. Her smoking use included cigarettes. She started smoking about 54 years ago. She has a 20 pack-year smoking history. She has never used smokeless tobacco. She reports that she does not drink alcohol and does not use drugs.  Allergies  Allergen Reactions   Penicillin G Itching and Other (See Comments)   Penicillins Hives, Itching, Swelling and Rash    Family History  Problem Relation Age of Onset   Stroke Mother    Cancer Father    Anesthesia problems Neg Hx    Hypotension Neg Hx    Malignant hyperthermia Neg Hx    Pseudochol deficiency Neg Hx     Prior to Admission medications   Medication Sig Start Date End Date Taking? Authorizing Provider  ALPRAZolam Prudy Feeler) 0.5 MG tablet Take 0.5 mg by mouth 2 (two) times daily.   Yes [provider]  bisoprolol (ZEBETA) 5 MG tablet Take 5 mg by mouth daily.   Yes [provider]  CAPLYTA 42 MG capsule Take 42 mg by mouth daily. 02/17/22  Yes [provider]  diltiazem (CARDIZEM CD) 120 MG 24 hr capsule Take 1 capsule (120 mg total) by mouth daily. 04/01/18  Yes Vassie Loll, MD  FARXIGA 10 MG TABS tablet Take 10 mg by mouth daily. 01/21/21  Yes [provider]  HYDROcodone-acetaminophen (NORCO/VICODIN) 5-325 MG tablet Take 1 tablet by mouth 2 (two) times daily as needed for moderate pain or severe pain. 05/02/20  Yes [provider]  ipratropium (ATROVENT) 0.06 % nasal spray Place 2 sprays into both nostrils in the morning and at bedtime.   Yes [provider]  levothyroxine (SYNTHROID, LEVOTHROID) 50 MCG tablet Take 50 mcg by mouth daily before breakfast.    Yes [provider]  Multiple Vitamin (MULTIVITAMIN WITH MINERALS) TABS tablet Take 1 tablet by mouth daily.   Yes [provider]  omeprazole (PRILOSEC) 20 MG capsule Take 20 mg by mouth daily.   Yes [provider]  OXYGEN Inhale 2 L into the lungs daily.    Yes [provider]   rOPINIRole (REQUIP) 3 MG tablet Take 3 mg by mouth 2 (two) times daily.   Yes [provider]  rosuvastatin (CRESTOR) 10 MG tablet Take 10 mg by mouth daily.   Yes [provider]  torsemide (DEMADEX) 100 MG tablet Take 100 mg by mouth daily.   Yes [provider]  warfarin (COUMADIN) 2 MG tablet Take 1.5-2 tablets (3-4 mg total) by mouth See admin instructions. 4mg  on Tuesday and 3 mg daily the rest of the week. 09/06/18  Yes Vassie Loll, MD  bisoprolol (ZEBETA) 10 MG tablet Take 10 mg by mouth daily.    [provider]  rosuvastatin (CRESTOR) 20 MG tablet TAKE ONE (1) TABLET BY MOUTH EVERY DAY 02/06/23  Jonelle Sidle, MD    Physical Exam: Vitals:   06/11/23 1359 06/11/23 1630 06/11/23 1725 06/11/23 1730  BP:  (!) 135/106  116/75  Pulse:  (!) 57 63 68  Resp:  18 (!) 23   Temp:      SpO2:  94% (!) 88% 93%  Weight: 69.6 kg     Height: 5\' 8"  (1.727 m)       Constitutional: NAD, calm, comfortable Vitals:   06/11/23 1359 06/11/23 1630 06/11/23 1725 06/11/23 1730  BP:  (!) 135/106  116/75  Pulse:  (!) 57 63 68  Resp:  18 (!) 23   Temp:      SpO2:  94% (!) 88% 93%  Weight: 69.6 kg     Height: 5\' 8"  (1.727 m)      Eyes: PERRL, lids and conjunctivae normal ENMT: Mucous membranes are moist.  Neck: normal, supple, no masses, no thyromegaly Respiratory: clear to auscultation bilaterally, no wheezing, no crackles. Normal respiratory effort. No accessory muscle use.  Cardiovascular: Regular rate and rhythm, mechanical aortic valve No extremity edema.  Abdomen: no tenderness, no masses palpated. No hepatosplenomegaly. Bowel sounds positive.  Musculoskeletal: no clubbing / cyanosis. No joint deformity upper and lower extremities.  Skin: Chronic skin discolorations to baseline unchanged from baseline, no ulcers. No induration Neurologic: No facial asymmetry, right lower extremity not tested, able to move other extremities spontaneously.   Psychiatric: Normal judgment and insight. Alert and oriented x 3. Normal mood.   Labs on Admission: I have personally reviewed following labs and imaging studies  CBC: Recent Labs  Lab 06/11/23 1547  WBC 11.1*  NEUTROABS 8.6*  HGB 12.1  HCT 38.1  MCV 90.3  PLT 212   Basic Metabolic Panel: Recent Labs  Lab 06/11/23 1547  NA 139  K 3.1*  CL 97*  CO2 32  GLUCOSE 95  BUN 22  CREATININE 1.46*  CALCIUM 9.4   GFR: Estimated Creatinine Clearance: 35.7 mL/min (A) (by C-G formula based on SCr of 1.46 mg/dL (H)). Liver Function Tests: Recent Labs  Lab 06/11/23 1547  AST 33  ALT 20  ALKPHOS 69  BILITOT 0.8  PROT 7.8  ALBUMIN 4.2   Recent Labs  Lab 06/11/23 1547  LIPASE 40   No results for input(s): "AMMONIA" in the last 168 hours. Coagulation Profile: Recent Labs  Lab 06/11/23 1547  INR 1.7*   Urine analysis:    Component Value Date/Time   COLORURINE STRAW (A) 06/11/2023 1434   APPEARANCEUR CLEAR 06/11/2023 1434   LABSPEC 1.006 06/11/2023 1434   PHURINE 6.0 06/11/2023 1434   GLUCOSEU 150 (A) 06/11/2023 1434   HGBUR NEGATIVE 06/11/2023 1434   BILIRUBINUR NEGATIVE 06/11/2023 1434   KETONESUR NEGATIVE 06/11/2023 1434   PROTEINUR NEGATIVE 06/11/2023 1434   NITRITE NEGATIVE 06/11/2023 1434   LEUKOCYTESUR NEGATIVE 06/11/2023 1434    Radiological Exams on Admission: CT CHEST ABDOMEN PELVIS WO CONTRAST  Result Date: 06/11/2023 CLINICAL DATA:  Trauma.  Hit left ribs. EXAM: CT CHEST, ABDOMEN AND PELVIS WITHOUT CONTRAST TECHNIQUE: Multidetector CT imaging of the chest, abdomen and pelvis was performed following the standard protocol without IV contrast. RADIATION DOSE REDUCTION: This exam was performed according to the departmental dose-optimization program which includes automated exposure control, adjustment of the mA and/or kV according to patient size and/or use of iterative reconstruction technique. COMPARISON:  CT abdomen and pelvis 04/20/2013 FINDINGS: CT  CHEST FINDINGS Cardiovascular: The aorta is ectatic. Bovine arch anatomy is noted. There are atherosclerotic calcifications  of the aorta and coronary arteries. The heart is enlarged. There is no pericardial effusion. Patient is status post cardiac surgery. Mediastinum/Nodes: No enlarged mediastinal, hilar, or axillary lymph nodes. Thyroid gland, trachea, and esophagus demonstrate no significant findings. There is no pneumomediastinum or mediastinal hematoma. Lungs/Pleura: There some scattered peripheral reticular opacities throughout the lungs, greater on the right. There is no focal lung consolidation, pleural effusion or pneumothorax. Musculoskeletal: There are nondisplaced acute left anterior fourth, fifth, sixth and seventh rib fractures. There are additional healed bilateral rib fractures. Sternotomy wires are present. CT ABDOMEN PELVIS FINDINGS Hepatobiliary: No focal liver abnormality is seen. No gallstones, gallbladder wall thickening, or biliary dilatation. Pancreas: Unremarkable. No pancreatic ductal dilatation or surrounding inflammatory changes. Spleen: Normal in size without focal abnormality. Adrenals/Urinary Tract: Adrenal glands are unremarkable. There is no hydronephrosis or urinary tract calculus. Cortical cysts are seen in both kidneys measuring 1 cm or less. Bladder is unremarkable. Stomach/Bowel: Multiple bowel anastomoses are present. The appendix is not seen. There is no focal inflammation, pneumatosis or free air. There is no evidence for bowel obstruction. The stomach is within normal limits. Colonic diverticula are present. Vascular/Lymphatic: Aortic atherosclerosis. No enlarged abdominal or pelvic lymph nodes. Reproductive: Status post hysterectomy. No adnexal masses. Other: No abdominal wall hernia or abnormality. No abdominopelvic ascites. Musculoskeletal: The bones are osteopenic. There is moderate compression deformity of L2 which is new from 2014. This is favored as chronic, but age  indeterminate. There is mild retropulsion of fracture fragments without significant central spinal canal stenosis. IMPRESSION: 1. Acute nondisplaced left anterior fourth through seventh rib fractures. No pneumothorax. 2. Moderate compression deformity of L2 is new from 2014. This is favored as chronic, but age indeterminate. 3. No acute localizing process in the abdomen or pelvis. 4. Scattered peripheral reticular opacities throughout the lungs, greater on the right, may represent chronic interstitial lung disease. 5. Cardiomegaly. 6. Bosniak I benign renal cyst measuring 1 cm. No follow-up imaging is recommended. JACR 2018 Feb; 264-273, Management of the Incidental Renal Mass on CT, [employer] 2021; 814-848, Bosniak Classification of Cystic Renal Masses, Version 2019. Aortic Atherosclerosis (ICD10-I70.0). Electronically Signed   By: Darliss Cheney M.D.   On: 06/11/2023 16:45   CT Head Wo Contrast  Result Date: 06/11/2023 CLINICAL DATA:  Polytrauma, blunt fall EXAM: CT HEAD WITHOUT CONTRAST CT CERVICAL SPINE WITHOUT CONTRAST TECHNIQUE: Multidetector CT imaging of the head and cervical spine was performed following the standard protocol without intravenous contrast. Multiplanar CT image reconstructions of the cervical spine were also generated. RADIATION DOSE REDUCTION: This exam was performed according to the departmental dose-optimization program which includes automated exposure control, adjustment of the mA and/or kV according to patient size and/or use of iterative reconstruction technique. COMPARISON:  None Available. FINDINGS: CT HEAD FINDINGS Brain: No evidence of acute infarction, hemorrhage, hydrocephalus, extra-axial collection or mass lesion/mass effect. Scattered low-density changes within the periventricular and subcortical white matter most compatible with chronic microvascular ischemic change. Mild diffuse cerebral volume loss. Vascular: Atherosclerotic calcifications involving the large vessels  of the skull base. No unexpected hyperdense vessel. Skull: Normal. Negative for fracture or focal lesion. Sinuses/Orbits: No acute finding. Other: Negative for scalp hematoma. CT CERVICAL SPINE FINDINGS Alignment: Facet joints are aligned without dislocation or traumatic listhesis. Dens and lateral masses are aligned. Skull base and vertebrae: No acute fracture. No primary bone lesion or focal pathologic process. Soft tissues and spinal canal: No prevertebral fluid or swelling. No visible canal hematoma. Disc levels: Disc heights are relatively  well preserved. Minor facet spurring. Upper chest: See dedicated CT chest, abdomen, pelvis for full assessment of the intrathoracic findings. Other: Bilateral carotid atherosclerosis. IMPRESSION: 1. No acute intracranial abnormality. 2. No acute fracture or subluxation of the cervical spine. Electronically Signed   By: Duanne Guess D.O.   On: 06/11/2023 16:37   CT Cervical Spine Wo Contrast  Result Date: 06/11/2023 CLINICAL DATA:  Polytrauma, blunt fall EXAM: CT HEAD WITHOUT CONTRAST CT CERVICAL SPINE WITHOUT CONTRAST TECHNIQUE: Multidetector CT imaging of the head and cervical spine was performed following the standard protocol without intravenous contrast. Multiplanar CT image reconstructions of the cervical spine were also generated. RADIATION DOSE REDUCTION: This exam was performed according to the departmental dose-optimization program which includes automated exposure control, adjustment of the mA and/or kV according to patient size and/or use of iterative reconstruction technique. COMPARISON:  None Available. FINDINGS: CT HEAD FINDINGS Brain: No evidence of acute infarction, hemorrhage, hydrocephalus, extra-axial collection or mass lesion/mass effect. Scattered low-density changes within the periventricular and subcortical white matter most compatible with chronic microvascular ischemic change. Mild diffuse cerebral volume loss. Vascular: Atherosclerotic  calcifications involving the large vessels of the skull base. No unexpected hyperdense vessel. Skull: Normal. Negative for fracture or focal lesion. Sinuses/Orbits: No acute finding. Other: Negative for scalp hematoma. CT CERVICAL SPINE FINDINGS Alignment: Facet joints are aligned without dislocation or traumatic listhesis. Dens and lateral masses are aligned. Skull base and vertebrae: No acute fracture. No primary bone lesion or focal pathologic process. Soft tissues and spinal canal: No prevertebral fluid or swelling. No visible canal hematoma. Disc levels: Disc heights are relatively well preserved. Minor facet spurring. Upper chest: See dedicated CT chest, abdomen, pelvis for full assessment of the intrathoracic findings. Other: Bilateral carotid atherosclerosis. IMPRESSION: 1. No acute intracranial abnormality. 2. No acute fracture or subluxation of the cervical spine. Electronically Signed   By: Duanne Guess D.O.   On: 06/11/2023 16:37   DG Knee Complete 4 Views Right  Result Date: 06/11/2023 CLINICAL DATA:  Fall.  Right knee pain. EXAM: RIGHT KNEE - COMPLETE 4+ VIEW COMPARISON:  None Available. FINDINGS: There is diffuse decreased bone mineralization. There appears to be 1 mm lucent cortical defect within the central aspect of the lateral tibial plateau. On oblique view there is a more distinct 4 mm craniocaudal depth fracture line lucency in this region at the junction of the lateral tibial spine and lateral tibial plateau. There is up to approximately 4 mm depression of the more lateral aspect of the lateral tibial plateau with additional mild subcortical lucency suspicious for an acute fracture. Moderate distal lateral tibial plateau degenerative osteophytosis. Moderate patellofemoral joint space narrowing and peripheral osteophytosis. Tiny joint effusion. Surgical clips overlie the medial knee soft tissues. IMPRESSION: 1. Acute fracture of the junction of the lateral tibial spine and lateral  tibial plateau. There is up to 4 mm depression of the more lateral aspect of lateral tibial plateau which may be acute or chronic. There do appear to be some subcortical acute fracture lines in this region of the lateral aspect of the lateral tibial plateau. 2. Moderate patellofemoral osteoarthritis. Electronically Signed   By: Neita Garnet M.D.   On: 06/11/2023 16:32   DG Chest 2 View  Result Date: 06/11/2023 CLINICAL DATA:  Rib pain, fall left-sided rib pain under the breast with inspiration EXAM: CHEST - 2 VIEW COMPARISON:  05/28/2019, CT 07/13/2019 FINDINGS: Post sternotomy changes and valve prosthesis. Enlarged cardiomediastinal silhouette with probable mild vascular congestion. Mild  chronic pleural thickening on the right. Age indeterminate left lower ninth and tenth anterolateral rib deformities. Chronic upper lumbar compression deformity. IMPRESSION: 1. Age indeterminate left lower ninth and tenth anterolateral rib deformities. 2. Enlarged cardiomediastinal silhouette with probable mild vascular congestion. Electronically Signed   By: Jasmine Pang M.D.   On: 06/11/2023 15:55    EKG: Independently reviewed.  Atrial fibrillation rate 50s QTc 490.  No significant change from prior.  Assessment/Plan Principal Problem:   Right tibial fracture Active Problems:   Rib fractures   Hypokalemia   Hypothyroidism   Atrial fibrillation, chronic (HCC)   H/O heart valve replacement with mechanical valve   Chronic anticoagulation   Coronary artery disease of bypass graft of native heart with stable angina pectoris (HCC)   CKD (chronic kidney disease) stage 3, GFR 30-59 ml/min (HCC)   CHF (congestive heart failure) (HCC)   Benign essential HTN  Assessment and Plan: * Right tibial fracture Status post mechanical fall, CT of the knee- Comminuted depressed fracture involving the lateral tibial plateau. Step-off of the level of the joint line approaches 2 mm.  Head and cervical CT negative for acute  abnormality.  Last dose of warfarin was this morning.  INR 1.7.  CT pelvis negative QTc 4 fracture. - Dr. Dallas Schimke to see in consult in a.m - Gentle hydration N/s 75cc/hr + 20KCL  x 12hrs -N.p.o. midnight -IV fentanyl 25mg  Q4h, hold for systolic less than 100 -Hold warfarin  Rib fractures Status post mechanical fall.  Chest CT shows acute nondisplaced left anterior 4th through 7th rib fractures. -IV fentanyl 25 mg as needed hold for systolic less than 100 -Incentive spirometry  Hypothyroidism Resume Synthroid  Hypokalemia Potassium 3.1.  Magnesium 2.2. -Replete K  Benign essential HTN Blood pressure soft. -Hold bisoprolol, Cardizem and torsemide  CHF (congestive heart failure) (HCC) Stable and compensated.  Last echo 08/2022 EF of 55 to 60%. -Hold Toprol, torsemide 100 mg daily  CKD (chronic kidney disease) stage 3, GFR 30-59 ml/min (HCC) CKD 3B.  Creatinine 1.4 at baseline.  Chronic anticoagulation On warfarin for atrial fibrillation. -Hold warfarin -Hold bisoprolol and diltiazem with soft blood pressure  H/O heart valve replacement with mechanical valve On chronic anticoagulation with warfarin.  INR subtherapeutic at 1.7. -Await Ortho eval in a.m., may require bridging with heparin pending surgery  Atrial fibrillation, chronic (HCC) Rate controlled on anticoagulation with warfarin. - Blood pressure soft, systolic down to 99. -Hold warfarin, hold bisoprolol, diltiazem   DVT prophylaxis: SCDs Code Status: Partial code, DO NOT INTUBATE.  Confirmed DNR status with both patient and son at bedside.  Will need to be reversed for surgery-if surgery indicated Family Communication: Son [name] at bedside. Disposition Plan: > 2 days Consults called: Ortho Admission status: Inpt Tele I certify that at the point of admission it is my clinical judgment that the patient will require inpatient hospital care spanning beyond 2 midnights from the point of admission due to high intensity  of service, high risk for further deterioration and high frequency of surveillance required.  Author: Onnie Boer, MD 06/11/2023 11:00 PM  For on call review [email or web address].

## 2023-06-11 NOTE — Assessment & Plan Note (Addendum)
-  Stable and compensated.  Last echo 08/2022 EF of 55 to 60%. -Holding torsemide and beta-blocker in the setting of soft blood pressure -Continue to follow daily weights and strict I's and O's.

## 2023-06-11 NOTE — Assessment & Plan Note (Addendum)
-  Rate controlled on anticoagulation with warfarin. -Blood pressure is soft currently -Continue holding diltiazem and bisoprolol -Continue telemetry monitoring.

## 2023-06-12 DIAGNOSIS — S82191D Other fracture of upper end of right tibia, subsequent encounter for closed fracture with routine healing: Secondary | ICD-10-CM | POA: Diagnosis not present

## 2023-06-12 DIAGNOSIS — S82191A Other fracture of upper end of right tibia, initial encounter for closed fracture: Secondary | ICD-10-CM

## 2023-06-12 DIAGNOSIS — I482 Chronic atrial fibrillation, unspecified: Secondary | ICD-10-CM | POA: Diagnosis not present

## 2023-06-12 DIAGNOSIS — I5032 Chronic diastolic (congestive) heart failure: Secondary | ICD-10-CM | POA: Diagnosis not present

## 2023-06-12 DIAGNOSIS — Z952 Presence of prosthetic heart valve: Secondary | ICD-10-CM

## 2023-06-12 DIAGNOSIS — S2242XA Multiple fractures of ribs, left side, initial encounter for closed fracture: Secondary | ICD-10-CM | POA: Diagnosis not present

## 2023-06-12 DIAGNOSIS — N1832 Chronic kidney disease, stage 3b: Secondary | ICD-10-CM

## 2023-06-12 LAB — BASIC METABOLIC PANEL
Anion gap: 8 (ref 5–15)
BUN: 21 mg/dL (ref 8–23)
CO2: 28 mmol/L (ref 22–32)
Calcium: 8.4 mg/dL — ABNORMAL LOW (ref 8.9–10.3)
Chloride: 100 mmol/L (ref 98–111)
Creatinine, Ser: 1.54 mg/dL — ABNORMAL HIGH (ref 0.44–1.00)
GFR, Estimated: 36 mL/min — ABNORMAL LOW (ref >60)
Glucose, Bld: 95 mg/dL (ref 70–99)
Potassium: 3.7 mmol/L (ref 3.5–5.1)
Sodium: 136 mmol/L (ref 135–145)

## 2023-06-12 LAB — CBC
HCT: 32.1 % — ABNORMAL LOW (ref 36.0–46.0)
Hemoglobin: 10.2 g/dL — ABNORMAL LOW (ref 12.0–15.0)
MCH: 28.7 pg (ref 26.0–34.0)
MCHC: 31.8 g/dL (ref 30.0–36.0)
MCV: 90.2 fL (ref 80.0–100.0)
Platelets: 168 10*3/uL (ref 150–400)
RBC: 3.56 MIL/uL — ABNORMAL LOW (ref 3.87–5.11)
RDW: 14.1 % (ref 11.5–15.5)
WBC: 7.7 10*3/uL (ref 4.0–10.5)
nRBC: 0 % (ref 0.0–0.2)

## 2023-06-12 LAB — PROTIME-INR
INR: 2 — ABNORMAL HIGH (ref 0.8–1.2)
Prothrombin Time: 22.7 s — ABNORMAL HIGH (ref 11.4–15.2)

## 2023-06-12 MED ORDER — WARFARIN - PHARMACIST DOSING INPATIENT: Freq: Every day | Status: DC

## 2023-06-12 MED ORDER — KETOROLAC TROMETHAMINE 15 MG/ML IJ SOLN
15.0000 mg | Freq: Once | INTRAMUSCULAR | Status: AC
  Administered 2023-06-12: 15 mg via INTRAVENOUS
  Filled 2023-06-12: qty 1

## 2023-06-12 MED ORDER — WARFARIN SODIUM 1 MG PO TABS
1.0000 mg | ORAL_TABLET | Freq: Once | ORAL | Status: AC
  Administered 2023-06-12: 1 mg via ORAL
  Filled 2023-06-12: qty 1

## 2023-06-12 NOTE — Consult Note (Signed)
ORTHOPAEDIC CONSULTATION  REQUESTING PHYSICIAN: Vassie Loll, MD  ASSESSMENT AND PLAN: 72 y.o. female with the following: Right lateral tibial plateau fracture; Schatzker III  Given that impression of the injury to the lateral tibial plateau of the right knee, she may benefit from operative fixation.  This was discussed with the patient today.  Does not need to be done in an urgent fashion.  We need to ensure that she is medically stable to proceed with surgery.  In addition, we will need to have a good plan for her anticoagulation.  I have also stressed to the patient that she will need to be medically cleared to proceed with surgery at [facility].  I will discuss with anesthesia to determine if this case can be safely done at [facility].  She states her understanding.  I will continue to follow her.  If she is medically stable for discharge over the weekend, she does not need to remain inpatient.  I can see her in clinic next week to discuss the next steps.  - Weight Bearing Status/Activity: Nonweightbearing right lower extremity, in a knee immobilizer  - Additional recommended labs/tests: None  -VTE Prophylaxis: Continue Coumadin  - Pain control: As needed  - Follow-up plan: To be determined  -Procedures: None planned currently  Chief Complaint: Right knee pain  HPI: [name] is a 72 y.o. female with past medical history as listed below.  She was cleaning in her bathroom yesterday, when she was standing on her sink.  Unfortunately, she slipped as she was getting down, after stepping on the toilet seat.  She fell.  She presented the emergency department.  She has noted to have multiple rib fractures, as well as a right knee injury.  Her pain is controlled currently.  She is breathing with oxygen at her baseline.  She states that she does not have any problems breathing.  She denies numbness and tingling.  Past Medical History:  Diagnosis Date   Anxiety disorder    Aortic  stenosis    Status post St. Jude mechanical AVR 2007   Asthma    Atrial fibrillation (HCC)    Carcinoid tumor of colon 2007   Chronic diastolic heart failure (HCC)    Coronary atherosclerosis of native coronary artery    Status post CABG 2007   GERD (gastroesophageal reflux disease)    History of colonoscopy 2003   Dr. 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 Jena Gauss, benign biopsies   RLS (restless legs syndrome)    Schatzki's ring    Past Surgical History:  Procedure Laterality Date   ABDOMINAL HYSTERECTOMY     AORTIC VALVE REPLACEMENT  2007   #25 mm St. Jude mechanical prosthesis with Hemashield tube graft repair of ascending aneurysm   APPENDECTOMY  2007   BACK SURGERY     lumbar 4 and 5    BIOPSY  02/05/2012   RMR:Two tongues of salmon-colored epithelium distal esophagus, very short-segment Barrett's s/p bx/Small hiatal hernia, otherwise normal stomach, D1, D2. Status post esophageal dilation. Biopsy showed GERD.   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 Jena Gauss- schatzki's ring, shoft peptic stricture at GE junction.   ESOPHAGOGASTRODUODENOSCOPY (EGD) WITH PROPOFOL N/A 02/08/2018   Procedure: ESOPHAGOGASTRODUODENOSCOPY (EGD) WITH PROPOFOL;  Surgeon: Corbin Ade, MD;  Location: [facility];  Service: Endoscopy;  Laterality: N/A;  8:15am   FOOT SURGERY     bilateral bunionectomy   HERNIA REPAIR     with mesh   LAPAROTOMY  2007   small bowel resection secondary to small bowel obstruction   MALONEY DILATION  02/05/2012   Procedure: Elease Hashimoto DILATION;  Surgeon:  Corbin Ade, MD;  Location: [facility] ORS;  Service: Endoscopy;  Laterality: N/A;  56mm    MALONEY DILATION N/A 02/08/2018   Procedure: Elease Hashimoto DILATION;  Surgeon: Corbin Ade, MD;  Location: [facility];  Service: Endoscopy;  Laterality: N/A;   Teeth removal     Social History   Socioeconomic History   Marital status: Married    Spouse name: Not on file   Number of children: 1   Years of education: stopped in 10th grade   Highest education level: GED or equivalent  Occupational History   Occupation: Retired  Tobacco Use   Smoking status: Former    Current packs/day: 0.00    Average packs/day: 1 pack/day for 20.0 years (20.0 ttl pk-yrs)    Types: Cigarettes    Start date: 10/20/1968    Quit date: 10/20/1988    Years since quitting: 34.6   Smokeless tobacco: Never  Vaping Use   Vaping status: Never Used  Substance and Sexual Activity   Alcohol use: No    Alcohol/week: 0.0 standard drinks of alcohol   Drug use: No   Sexual activity: Yes    Partners: Male    Birth control/protection: None    Comment: spouse  Other Topics Concern   Not on file  Social History Narrative   Not on file   Social Determinants of Health   Financial Resource Strain: Low Risk  (07/28/2018)   Overall Financial Resource Strain (CARDIA)    Difficulty of Paying Living Expenses: Not hard at all  Food Insecurity: No Food Insecurity (06/11/2023)   Hunger Vital Sign    Worried About Running Out of Food in the Last Year: Never true    Ran Out of Food in the Last Year: Never true  Transportation Needs: No Transportation Needs (06/11/2023)   PRAPARE - Transportation    Lack of Transportation (Medical): No    Lack of Transportation (Non-Medical): No  Physical Activity: Inactive (07/28/2018)   Exercise Vital Sign    Days of Exercise per Week: 0 days    Minutes of Exercise per Session: 0 min  Stress: No Stress Concern Present (07/28/2018)   [employer] of Occupational Health - Occupational Stress  Questionnaire    Feeling of Stress : Not at all  Social Connections: Somewhat Isolated (07/28/2018)   Social Connection and Isolation Panel [NHANES]    Frequency of Communication with Friends and Family: Never    Frequency of Social Gatherings with Friends and Family: Never    Attends Religious Services: More than 4 times per year    Active Member of [employer] or Organizations: No    Attends Banker Meetings: Never    Marital Status: Married   Family History  Problem Relation Age of Onset   Stroke Mother    Cancer Father  Anesthesia problems Neg Hx    Hypotension Neg Hx    Malignant hyperthermia Neg Hx    Pseudochol deficiency Neg Hx    Allergies  Allergen Reactions   Penicillin G Itching and Other (See Comments)   Penicillins Hives, Itching, Swelling and Rash   Prior to Admission medications   Medication Sig Start Date End Date Taking? Authorizing Provider  ALPRAZolam Prudy Feeler) 0.5 MG tablet Take 0.5 mg by mouth 2 (two) times daily.   Yes [provider]  bisoprolol (ZEBETA) 5 MG tablet Take 5 mg by mouth daily.   Yes [provider]  CAPLYTA 42 MG capsule Take 42 mg by mouth daily. 02/17/22  Yes [provider]  diltiazem (CARDIZEM CD) 120 MG 24 hr capsule Take 1 capsule (120 mg total) by mouth daily. 04/01/18  Yes Vassie Loll, MD  FARXIGA 10 MG TABS tablet Take 10 mg by mouth daily. 01/21/21  Yes [provider]  HYDROcodone-acetaminophen (NORCO/VICODIN) 5-325 MG tablet Take 1 tablet by mouth 2 (two) times daily as needed for moderate pain or severe pain. 05/02/20  Yes [provider]  ipratropium (ATROVENT) 0.06 % nasal spray Place 2 sprays into both nostrils in the morning and at bedtime.   Yes [provider]  levothyroxine (SYNTHROID, LEVOTHROID) 50 MCG tablet Take 50 mcg by mouth daily before breakfast.    Yes [provider]  Multiple Vitamin (MULTIVITAMIN WITH MINERALS) TABS tablet Take 1 tablet by mouth  daily.   Yes [provider]  omeprazole (PRILOSEC) 20 MG capsule Take 20 mg by mouth daily.   Yes [provider]  OXYGEN Inhale 2 L into the lungs daily.    Yes [provider]  rOPINIRole (REQUIP) 3 MG tablet Take 3 mg by mouth 2 (two) times daily.   Yes [provider]  rosuvastatin (CRESTOR) 10 MG tablet Take 10 mg by mouth daily.   Yes [provider]  torsemide (DEMADEX) 100 MG tablet Take 100 mg by mouth daily.   Yes [provider]  warfarin (COUMADIN) 2 MG tablet Take 1.5-2 tablets (3-4 mg total) by mouth See admin instructions. 4mg  on Tuesday and 3 mg daily the rest of the week. 09/06/18  Yes Vassie Loll, MD  bisoprolol (ZEBETA) 10 MG tablet Take 10 mg by mouth daily.    [provider]  rosuvastatin (CRESTOR) 20 MG tablet TAKE ONE (1) TABLET BY MOUTH EVERY DAY 02/06/23   Jonelle Sidle, MD   CT Knee Right Wo Contrast  Result Date: 06/11/2023 CLINICAL DATA:  Tibial plateau fracture EXAM: CT OF THE RIGHT KNEE WITHOUT CONTRAST TECHNIQUE: Multidetector CT imaging of the right knee was performed according to the standard protocol. Multiplanar CT image reconstructions were also generated. RADIATION DOSE REDUCTION: This exam was performed according to the departmental dose-optimization program which includes automated exposure control, adjustment of the mA and/or kV according to patient size and/or use of iterative reconstruction technique. COMPARISON:  Knee x-ray 06/11/2023 earlier FINDINGS: Bones/Joint/Cartilage Osteopenia. There is a comminuted fracture of the lateral tibial plateau at the level of the joint space. There is a focal area of step-off with the depressed fragments approaching up to 2 mm at the level of the joint line medially. No additional fracture or dislocation. There are osteophytes seen of all 3 compartments as well. There is some joint space loss involving the patellofemoral joint particularly laterally. Small  joint effusion. Ligaments Suboptimally assessed by CT. Muscles and Tendons Global muscle fatty atrophy identified.  Soft tissues Soft tissue edema identified. IMPRESSION: Comminuted depressed fracture involving the lateral tibial plateau. Step-off of the level of the joint line approaches 2 mm No additional fracture or dislocation. Severe osteopenia. Tricompartment degenerative changes. Small joint effusion. Electronically Signed   By: Karen Kays M.D.   On: 06/11/2023 19:10   CT CHEST ABDOMEN PELVIS WO CONTRAST  Result Date: 06/11/2023 CLINICAL DATA:  Trauma.  Hit left ribs. EXAM: CT CHEST, ABDOMEN AND PELVIS WITHOUT CONTRAST TECHNIQUE: Multidetector CT imaging of the chest, abdomen and pelvis was performed following the standard protocol without IV contrast. RADIATION DOSE REDUCTION: This exam was performed according to the departmental dose-optimization program which includes automated exposure control, adjustment of the mA and/or kV according to patient size and/or use of iterative reconstruction technique. COMPARISON:  CT abdomen and pelvis 04/20/2013 FINDINGS: CT CHEST FINDINGS Cardiovascular: The aorta is ectatic. Bovine arch anatomy is noted. There are atherosclerotic calcifications of the aorta and coronary arteries. The heart is enlarged. There is no pericardial effusion. Patient is status post cardiac surgery. Mediastinum/Nodes: No enlarged mediastinal, hilar, or axillary lymph nodes. Thyroid gland, trachea, and esophagus demonstrate no significant findings. There is no pneumomediastinum or mediastinal hematoma. Lungs/Pleura: There some scattered peripheral reticular opacities throughout the lungs, greater on the right. There is no focal lung consolidation, pleural effusion or pneumothorax. Musculoskeletal: There are nondisplaced acute left anterior fourth, fifth, sixth and seventh rib fractures. There are additional healed bilateral rib fractures. Sternotomy wires are present. CT ABDOMEN PELVIS  FINDINGS Hepatobiliary: No focal liver abnormality is seen. No gallstones, gallbladder wall thickening, or biliary dilatation. Pancreas: Unremarkable. No pancreatic ductal dilatation or surrounding inflammatory changes. Spleen: Normal in size without focal abnormality. Adrenals/Urinary Tract: Adrenal glands are unremarkable. There is no hydronephrosis or urinary tract calculus. Cortical cysts are seen in both kidneys measuring 1 cm or less. Bladder is unremarkable. Stomach/Bowel: Multiple bowel anastomoses are present. The appendix is not seen. There is no focal inflammation, pneumatosis or free air. There is no evidence for bowel obstruction. The stomach is within normal limits. Colonic diverticula are present. Vascular/Lymphatic: Aortic atherosclerosis. No enlarged abdominal or pelvic lymph nodes. Reproductive: Status post hysterectomy. No adnexal masses. Other: No abdominal wall hernia or abnormality. No abdominopelvic ascites. Musculoskeletal: The bones are osteopenic. There is moderate compression deformity of L2 which is new from 2014. This is favored as chronic, but age indeterminate. There is mild retropulsion of fracture fragments without significant central spinal canal stenosis. IMPRESSION: 1. Acute nondisplaced left anterior fourth through seventh rib fractures. No pneumothorax. 2. Moderate compression deformity of L2 is new from 2014. This is favored as chronic, but age indeterminate. 3. No acute localizing process in the abdomen or pelvis. 4. Scattered peripheral reticular opacities throughout the lungs, greater on the right, may represent chronic interstitial lung disease. 5. Cardiomegaly. 6. Bosniak I benign renal cyst measuring 1 cm. No follow-up imaging is recommended. JACR 2018 Feb; 264-273, Management of the Incidental Renal Mass on CT, [employer] 2021; [id number], Bosniak Classification of Cystic Renal Masses, Version 2019. Aortic Atherosclerosis (ICD10-I70.0). Electronically Signed   By: Darliss Cheney M.D.   On: 06/11/2023 16:45   CT Head Wo Contrast  Result Date: 06/11/2023 CLINICAL DATA:  Polytrauma, blunt fall EXAM: CT HEAD WITHOUT CONTRAST CT CERVICAL SPINE WITHOUT CONTRAST TECHNIQUE: Multidetector CT imaging of the head and cervical spine was performed following the standard protocol without intravenous contrast. Multiplanar CT image reconstructions of the cervical spine were also generated. RADIATION DOSE REDUCTION: This exam was  performed according to the departmental dose-optimization program which includes automated exposure control, adjustment of the mA and/or kV according to patient size and/or use of iterative reconstruction technique. COMPARISON:  None Available. FINDINGS: CT HEAD FINDINGS Brain: No evidence of acute infarction, hemorrhage, hydrocephalus, extra-axial collection or mass lesion/mass effect. Scattered low-density changes within the periventricular and subcortical white matter most compatible with chronic microvascular ischemic change. Mild diffuse cerebral volume loss. Vascular: Atherosclerotic calcifications involving the large vessels of the skull base. No unexpected hyperdense vessel. Skull: Normal. Negative for fracture or focal lesion. Sinuses/Orbits: No acute finding. Other: Negative for scalp hematoma. CT CERVICAL SPINE FINDINGS Alignment: Facet joints are aligned without dislocation or traumatic listhesis. Dens and lateral masses are aligned. Skull base and vertebrae: No acute fracture. No primary bone lesion or focal pathologic process. Soft tissues and spinal canal: No prevertebral fluid or swelling. No visible canal hematoma. Disc levels: Disc heights are relatively well preserved. Minor facet spurring. Upper chest: See dedicated CT chest, abdomen, pelvis for full assessment of the intrathoracic findings. Other: Bilateral carotid atherosclerosis. IMPRESSION: 1. No acute intracranial abnormality. 2. No acute fracture or subluxation of the cervical spine.  Electronically Signed   By: Duanne Guess D.O.   On: 06/11/2023 16:37   CT Cervical Spine Wo Contrast  Result Date: 06/11/2023 CLINICAL DATA:  Polytrauma, blunt fall EXAM: CT HEAD WITHOUT CONTRAST CT CERVICAL SPINE WITHOUT CONTRAST TECHNIQUE: Multidetector CT imaging of the head and cervical spine was performed following the standard protocol without intravenous contrast. Multiplanar CT image reconstructions of the cervical spine were also generated. RADIATION DOSE REDUCTION: This exam was performed according to the departmental dose-optimization program which includes automated exposure control, adjustment of the mA and/or kV according to patient size and/or use of iterative reconstruction technique. COMPARISON:  None Available. FINDINGS: CT HEAD FINDINGS Brain: No evidence of acute infarction, hemorrhage, hydrocephalus, extra-axial collection or mass lesion/mass effect. Scattered low-density changes within the periventricular and subcortical white matter most compatible with chronic microvascular ischemic change. Mild diffuse cerebral volume loss. Vascular: Atherosclerotic calcifications involving the large vessels of the skull base. No unexpected hyperdense vessel. Skull: Normal. Negative for fracture or focal lesion. Sinuses/Orbits: No acute finding. Other: Negative for scalp hematoma. CT CERVICAL SPINE FINDINGS Alignment: Facet joints are aligned without dislocation or traumatic listhesis. Dens and lateral masses are aligned. Skull base and vertebrae: No acute fracture. No primary bone lesion or focal pathologic process. Soft tissues and spinal canal: No prevertebral fluid or swelling. No visible canal hematoma. Disc levels: Disc heights are relatively well preserved. Minor facet spurring. Upper chest: See dedicated CT chest, abdomen, pelvis for full assessment of the intrathoracic findings. Other: Bilateral carotid atherosclerosis. IMPRESSION: 1. No acute intracranial abnormality. 2. No acute fracture  or subluxation of the cervical spine. Electronically Signed   By: Duanne Guess D.O.   On: 06/11/2023 16:37   DG Knee Complete 4 Views Right  Result Date: 06/11/2023 CLINICAL DATA:  Fall.  Right knee pain. EXAM: RIGHT KNEE - COMPLETE 4+ VIEW COMPARISON:  None Available. FINDINGS: There is diffuse decreased bone mineralization. There appears to be 1 mm lucent cortical defect within the central aspect of the lateral tibial plateau. On oblique view there is a more distinct 4 mm craniocaudal depth fracture line lucency in this region at the junction of the lateral tibial spine and lateral tibial plateau. There is up to approximately 4 mm depression of the more lateral aspect of the lateral tibial plateau with additional mild subcortical lucency  suspicious for an acute fracture. Moderate distal lateral tibial plateau degenerative osteophytosis. Moderate patellofemoral joint space narrowing and peripheral osteophytosis. Tiny joint effusion. Surgical clips overlie the medial knee soft tissues. IMPRESSION: 1. Acute fracture of the junction of the lateral tibial spine and lateral tibial plateau. There is up to 4 mm depression of the more lateral aspect of lateral tibial plateau which may be acute or chronic. There do appear to be some subcortical acute fracture lines in this region of the lateral aspect of the lateral tibial plateau. 2. Moderate patellofemoral osteoarthritis. Electronically Signed   By: Neita Garnet M.D.   On: 06/11/2023 16:32   DG Chest 2 View  Result Date: 06/11/2023 CLINICAL DATA:  Rib pain, fall left-sided rib pain under the breast with inspiration EXAM: CHEST - 2 VIEW COMPARISON:  05/28/2019, CT 07/13/2019 FINDINGS: Post sternotomy changes and valve prosthesis. Enlarged cardiomediastinal silhouette with probable mild vascular congestion. Mild chronic pleural thickening on the right. Age indeterminate left lower ninth and tenth anterolateral rib deformities. Chronic upper lumbar compression  deformity. IMPRESSION: 1. Age indeterminate left lower ninth and tenth anterolateral rib deformities. 2. Enlarged cardiomediastinal silhouette with probable mild vascular congestion. Electronically Signed   By: Jasmine Pang M.D.   On: 06/11/2023 15:55    Family History Reviewed and non-contributory, no pertinent history of problems with bleeding or anesthesia    Review of Systems No fevers or chills No numbness or tingling No chest pain + shortness of breath No bowel or bladder dysfunction No GI distress No headaches    OBJECTIVE  Vitals:Patient Vitals for the past 8 hrs:  BP Temp Temp src Pulse Resp SpO2  06/12/23 0454 90/63 99.2 F (37.3 C) Oral 62 (!) 23 97 %  06/12/23 0355 (!) 96/55 99.7 F (37.6 C) Oral 62 (!) 25 96 %  06/12/23 0304 93/63 98.5 F (36.9 C) Oral 71 (!) 41 92 %  06/12/23 0031 103/69 99.8 F (37.7 C) Oral 67 (!) 24 94 %   General: Alert, no acute distress Cardiovascular: Warm extremities noted Respiratory: No cyanosis, no use of accessory musculature GI: No organomegaly, abdomen is soft and non-tender Skin: No lesions in the area of chief complaint other than those listed below in MSK exam.  Neurologic: Sensation intact distally save for the below mentioned MSK exam Psychiatric: Patient is competent for consent with normal mood and affect Lymphatic: No swelling obvious and reported other than the area involved in the exam below Extremities   RLE: Evaluation of the right knee demonstrates diffuse swelling.  No bruising is appreciated.  Tenderness to palpation laterally.  Range of motion is deferred.  She has open lesions in the lower leg, which are chronic.  No numbness or tingling.  Active motion intact in the EHL and TA.   Test Results Imaging  X-rays and CT scan of the right knee demonstrates a lateral tibial plateau fracture, with isolated depression of the lateral joint line.  Labs cbc Recent Labs    06/11/23 1547 06/12/23 0431  WBC 11.1* 7.7   HGB 12.1 10.2*  HCT 38.1 32.1*  PLT 212 168     Labs coag Recent Labs    06/11/23 1547 06/12/23 0431  INR 1.7* 2.0*    Recent Labs    06/11/23 1547 06/12/23 0431  NA 139 136  K 3.1* 3.7  CL 97* 100  CO2 32 28  GLUCOSE 95 95  BUN 22 21  CREATININE 1.46* 1.54*  CALCIUM 9.4 8.4*

## 2023-06-12 NOTE — Progress Notes (Signed)
Progress Note   Patient: [name] BJY:[id number] DOB: June 21, 1951 DOA: 06/11/2023     1 DOS: the patient was seen and examined on 06/12/2023   Brief hospital admission narrative: As per H&P written by Dr. Mariea Clonts on 06/11/2023 [name] is a 72 y.o. female with medical history significant for diastolic CHF, chronic respiratory failure on 2 L, atrial fibrillation, COPD. Patient presented to the ED with complaints of a fall with left-sided chest and knee pain.  Patient got up onto a counter to clean when she lost her balance and fell in the bathroom, hitting the left side of her chest and her right leg following into the commode.  It took about 30 minutes to crawl to her phone to call her son.  She denies any chest pain, no dizziness, no change in breathing.  He is on warfarin and took her last dose this morning.  She is also on diuretics-torsemide   ED Course: O2 sats ranging from 88 to 98% on 2 L.  Blood pressure systolic 99-132. K- 3.1.  UA not suggestive of infection. CT chest abdomen and pelvis without contrast-acute nondisplaced left anterior 4th through 7th rib fractures.  Moderate compression deformity of L2 new from 2014, favored as chronic, chronic interstitial lung disease. EDP talked to surgeon-Dr. Veneda Melter CT of the right knee, will see in consult. CT knee-comminuted displaced fracture involving the lateral tibial plateau, step-off of the level of the joint line approaches 2 mm.  Assessment and Plan: * Right tibial fracture -CT pelvis negative. - Dr. Dallas Schimke has seen patient in consultation and there is no plan for surgical intervention at the moment.  Will continue to follow recommendations. -Will advance diet; continue as needed analgesics, good hydration, the use of incentive spirometer and follow clinical response.   Rib fractures -Status post mechanical fall.  Chest CT shows acute nondisplaced left anterior 4th through 7th rib fractures. -Continue incentive  spirometer -Patient advised to take to breath despite discomfort to minimize atelectasis/pneumonia -Continue oxygen supplementation and wean off as tolerated.  Hypothyroidism -Continue Synthroid.  Hypokalemia -Repleted and within normal limits currently -Magnesium 2.2. -Continue to follow ultralights trend.  Benign essential HTN -Blood pressure is currently soft -Continue to follow vital signs. -Holding antihypertensive agents at the moment.  CHF (congestive heart failure) (HCC) -Stable and compensated.  Last echo 08/2022 EF of 55 to 60%. -Holding torsemide and beta-blocker in the setting of soft blood pressure -Continue to follow daily weights and strict I's and O's.  CKD (chronic kidney disease) stage 3, GFR 30-59 ml/min (HCC) -CKD 3B.   -For the most part appears to be stable -Continue to maintain adequate hydration -Continue to follow renal function trend.  Chronic anticoagulation On warfarin for atrial fibrillation. -Continue holding bisoprolol and diltiazem in the setting of soft blood pressure.  H/O heart valve replacement with mechanical valve -On chronic anticoagulation with warfarin.  INR subtherapeutic at 1.7. -Order for Coumadin/INR per pharmacy.  Atrial fibrillation, chronic (HCC) -Rate controlled on anticoagulation with warfarin. -Blood pressure is soft currently -Continue holding diltiazem and bisoprolol -Continue telemetry monitoring.    Subjective:  Afebrile, no nausea or vomiting; no fever.  Slightly tachypnea appreciated at times.  No using accessory muscles.  2 L nasal, supplementation in place.  Physical Exam: Vitals:   06/12/23 0454 06/12/23 0915 06/12/23 1329 06/12/23 1502  BP: 90/63 (!) 109/52 (!) 87/57 100/62  Pulse: 62 61 68 (!) 59  Resp: (!) 23 (!) 40  (!) 32  Temp:  99.2 F (37.3 C) 98.1 F (36.7 C) 98.4 F (36.9 C) 98.4 F (36.9 C)  TempSrc: Oral Oral Oral Oral  SpO2: 97% 96%  97%  Weight:      Height:       General exam:  Alert, awake, oriented x 3, no nausea, vomiting or shortness of breath.  Good saturation on 2 L supplementation.  Reporting right-sided costal discomfort especially with deep breaths. Respiratory system: No wheezing, no crackles; no using accessory muscle. Cardiovascular system: Rate controlled, no rubs, no gallops, no JVD. Gastrointestinal system: Abdomen is nondistended, soft and nontender. No organomegaly or masses felt. Normal bowel sounds heard. Central nervous system: Alert and oriented. No focal neurological deficits. Extremities: No cyanosis or clubbing; right lower extremity with knee immobilizer in place. Skin: No petechiae. Psychiatry: Judgement and insight appear normal. Mood & affect appropriate.   Data Reviewed: Basic metabolic panel: Sodium 136, potassium 3.7, chloride 100, bicarb 28, BUN 21, creatinine 1.54 and GFR 36 CBC: White blood cell 7.7, hemoglobin 10.2 and platelets 1 68K Protime-INR: 22.78/2.0 (respectively).  Family Communication: No family at bedside.  Patient expressed that she will update her family member.  Disposition: Status is: Inpatient Remains inpatient appropriate because: Continue analgesic therapy and management of rib fractures and right lower extremity fracture.   Planned Discharge Destination: Home with home health services versus skilled nursing facility.  Patient will discuss with son and provide decision on 06/13/2023.   Time spent: 35 minutes  Author: Vassie Loll, MD 06/12/2023 4:23 PM  For on call review [email or web address].

## 2023-06-12 NOTE — Progress Notes (Signed)
ANTICOAGULATION CONSULT NOTE - Initial Consult  Pharmacy Consult for warfarin Indication: atrial fibrillation  Allergies  Allergen Reactions   Penicillin G Itching and Other (See Comments)   Penicillins Hives, Itching, Swelling and Rash    Patient Measurements: Height: 5\' 8"  (172.7 cm) Weight: 69.4 kg (153 lb) IBW/kg (Calculated) : 63.9 Heparin Dosing Weight:   Vital Signs: Temp: 98.4 F (36.9 C) (08/23 1502) Temp Source: Oral (08/23 1502) BP: 100/62 (08/23 1502) Pulse Rate: 59 (08/23 1502)  Labs: Recent Labs    06/11/23 1547 06/12/23 0431  HGB 12.1 10.2*  HCT 38.1 32.1*  PLT 212 168  LABPROT 20.6* 22.7*  INR 1.7* 2.0*  CREATININE 1.46* 1.54*    Estimated Creatinine Clearance: 33.8 mL/min (A) (by C-G formula based on SCr of 1.54 mg/dL (H)).   Medical History: Past Medical History:  Diagnosis Date   Anxiety disorder    Aortic stenosis    Status post [employer] mechanical AVR 2007   Asthma    Atrial fibrillation (HCC)    Carcinoid tumor of colon 2007   Chronic diastolic heart failure (HCC)    Coronary atherosclerosis of native coronary artery    Status post CABG 2007   GERD (gastroesophageal reflux disease)    History of colonoscopy 2003   Dr. 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 Jena Gauss, benign biopsies   RLS (restless legs syndrome)    Schatzki's ring     Medications:  Medications Prior to Admission  Medication Sig Dispense Refill Last Dose   ALPRAZolam (XANAX) 0.5 MG tablet Take 0.5 mg by mouth 2 (two) times daily.   06/11/2023   bisoprolol (ZEBETA) 5 MG tablet Take 5 mg by mouth daily.   06/11/2023   CAPLYTA 42 MG capsule Take 42 mg by mouth daily.   06/11/2023   diltiazem (CARDIZEM CD) 120 MG 24 hr capsule Take 1 capsule (120 mg total) by mouth daily. 30 capsule 2 06/11/2023   FARXIGA 10 MG TABS tablet Take 10 mg by mouth daily.   06/11/2023    HYDROcodone-acetaminophen (NORCO/VICODIN) 5-325 MG tablet Take 1 tablet by mouth 2 (two) times daily as needed for moderate pain or severe pain.   Past Week   ipratropium (ATROVENT) 0.06 % nasal spray Place 2 sprays into both nostrils in the morning and at bedtime.   06/11/2023   levothyroxine (SYNTHROID, LEVOTHROID) 50 MCG tablet Take 50 mcg by mouth daily before breakfast.    06/11/2023   Multiple Vitamin (MULTIVITAMIN WITH MINERALS) TABS tablet Take 1 tablet by mouth daily.   06/11/2023   omeprazole (PRILOSEC) 20 MG capsule Take 20 mg by mouth daily.   06/11/2023   OXYGEN Inhale 2 L into the lungs daily.    06/11/2023   rOPINIRole (REQUIP) 3 MG tablet Take 3 mg by mouth 2 (two) times daily.   06/11/2023   rosuvastatin (CRESTOR) 10 MG tablet Take 10 mg by mouth daily.   06/11/2023   torsemide (DEMADEX) 100 MG tablet Take 100 mg by mouth daily.   06/11/2023   warfarin (COUMADIN) 2 MG tablet Take 1.5-2 tablets (3-4 mg total) by mouth See admin instructions. 4mg  on Tuesday and 3 mg daily the rest of the week.   06/11/2023   bisoprolol (ZEBETA) 10 MG tablet Take 10 mg by mouth daily.      rosuvastatin (CRESTOR) 20 MG tablet TAKE ONE (1) TABLET  BY MOUTH EVERY DAY 90 tablet 3     Assessment: Pharmacy consulted to dose warfarin in patient with atrial fibrillation.  Patient's home dose listed as 1 mg on Fridays and 2 mg ROW. Last dose 8/22 and INR currently 2.0.  Goal of Therapy:  INR 2-3 Monitor platelets by anticoagulation protocol: Yes   Plan:  Warfarin 1 mg x 1 dose. Daily INR   Judeth Cornfield, PharmD Clinical Pharmacist 06/12/2023 4:57 PM

## 2023-06-12 NOTE — Progress Notes (Signed)
   06/12/23 1329  Assess: MEWS Score  Temp 98.4 F (36.9 C)  BP (!) 87/57  MAP (mmHg) 67  Pulse Rate 68  Assess: MEWS Score  MEWS Temp 0  MEWS Systolic 1  MEWS Pulse 0  MEWS RR 3  MEWS LOC 0  MEWS Score 4  MEWS Score Color Red  Assess: if the MEWS score is Yellow or Red  Were vital signs accurate and taken at a resting state? Yes  Does the patient meet 2 or more of the SIRS criteria? No  MEWS guidelines implemented  Yes, red  Treat  MEWS Interventions Considered administering scheduled or prn medications/treatments as ordered  Take Vital Signs  Increase Vital Sign Frequency  Red: Q1hr x2, continue Q4hrs until patient remains green for 12hrs  Escalate  MEWS: Escalate Red: Discuss with charge nurse and notify provider. Consider notifying RRT. If remains red for 2 hours consider need for higher level of care  Assess: SIRS CRITERIA  SIRS Temperature  0  SIRS Pulse 0  SIRS Respirations  1  SIRS WBC 0  SIRS Score Sum  1

## 2023-06-12 NOTE — Progress Notes (Signed)
Son brought med Cap[place] from home which patient takes  at 1630 to help her sleep.  Sent to pharmacy.  Right leg in knee immobilizer and has not c/o pain.  Strong pulses, movement and warmth to extremity. Son at bedside.  Has been hypotensive with tachypnea. Stated that she sometimes just breaths fast.  On 2L O2 and 97%

## 2023-06-12 NOTE — Progress Notes (Signed)
   06/12/23 1502  Assess: MEWS Score  Temp 98.4 F (36.9 C)  BP 100/62  MAP (mmHg) 74  Pulse Rate (!) 59  Resp (!) 32  SpO2 97 %  O2 Device Nasal Cannula  O2 Flow Rate (L/min) 2 L/min  Assess: MEWS Score  MEWS Temp 0  MEWS Systolic 1  MEWS Pulse 0  MEWS RR 2  MEWS LOC 0  MEWS Score 3  MEWS Score Color Yellow  Assess: if the MEWS score is Yellow or Red  Were vital signs accurate and taken at a resting state? Yes  Does the patient meet 2 or more of the SIRS criteria? No  MEWS guidelines implemented  Yes, yellow  Treat  MEWS Interventions Considered administering scheduled or prn medications/treatments as ordered  Take Vital Signs  Increase Vital Sign Frequency  Yellow: Q2hr x1, continue Q4hrs until patient remains green for 12hrs  Escalate  MEWS: Escalate Yellow: Discuss with charge nurse and consider notifying provider and/or RRT  Assess: SIRS CRITERIA  SIRS Temperature  0  SIRS Pulse 0  SIRS Respirations  1  SIRS WBC 0  SIRS Score Sum  1

## 2023-06-12 NOTE — Progress Notes (Signed)
   06/12/23 0304  Assess: MEWS Score  Temp 98.5 F (36.9 C)  BP 93/63  MAP (mmHg) 73  Pulse Rate 71  ECG Heart Rate 76  Resp (!) 41  SpO2 92 %  O2 Device Nasal Cannula  O2 Flow Rate (L/min) 2 L/min  Assess: MEWS Score  MEWS Temp 0  MEWS Systolic 1  MEWS Pulse 0  MEWS RR 3  MEWS LOC 0  MEWS Score 4  MEWS Score Color Red  Assess: if the MEWS score is Yellow or Red  Were vital signs accurate and taken at a resting state? Yes  Does the patient meet 2 or more of the SIRS criteria? No  MEWS guidelines implemented  Yes, red  Treat  MEWS Interventions Considered administering scheduled or prn medications/treatments as ordered  Take Vital Signs  Increase Vital Sign Frequency  Red: Q1hr x2, continue Q4hrs until patient remains green for 12hrs  Escalate  MEWS: Escalate Red: Discuss with charge nurse and notify provider. Consider notifying RRT. If remains red for 2 hours consider need for higher level of care  Notify: Charge Nurse/RN  Name of Charge Nurse/RN Notified [facility]  Provider Notification  Provider Name/Title Dr. Thomes Dinning  Date Provider Notified 06/12/23  Time Provider Notified [phone]  Method of Notification Face-to-face  Notification Reason Change in status (Abnormal BP and resps, reports pain to right knee/leg, requesting pain med but BP not within parameters. MD on unit to evaluate)  Provider response See new orders  Date of Provider Response 06/12/23  Time of Provider Response 0343  Notify: Rapid Response  Name of Rapid Response RN Notified n/a  Assess: SIRS CRITERIA  SIRS Temperature  0  SIRS Pulse 0  SIRS Respirations  1  SIRS WBC 0  SIRS Score Sum  1

## 2023-06-12 NOTE — TOC Initial Note (Signed)
Transition of Care [facility]) - Initial/Assessment Note    Patient Details  Name: [name] MRN: [id number] Date of Birth: 03/27/1951  [facility] CM/SW Contact:    Annice Needy, LCSW Phone Number: 06/12/2023, 9:00 AM  Clinical Narrative:                 Patient from home, son lives with her. At baseline she is independent with ADLs and ambulation. She has a walker and a wheelchair in the home. She stated that she had to use the walker on yesterday after falling. TOC consulted for HH/DME needs. Discussed with patient, patient does not feel that she needs HH/DME and she is not interested in rehab.   Expected Discharge Plan: Home/Self Care Barriers to Discharge: Continued Medical Work up   Patient Goals and CMS Choice Patient states their goals for this hospitalization and ongoing recovery are:: return home          Expected Discharge Plan and Services       Living arrangements for the past 2 months: Single Family Home                                      Prior Living Arrangements/Services Living arrangements for the past 2 months: Single Family Home Lives with:: Adult Children Patient language and need for interpreter reviewed:: Yes        Need for Family Participation in Patient Care: Yes (Comment) Care giver support system in place?: Yes (comment) Current home services: DME (has walker and wc in the home) Criminal Activity/Legal Involvement Pertinent to Current Situation/Hospitalization: No - Comment as needed  Activities of Daily Living Home Assistive Devices/Equipment: Eyeglasses, Oxygen, Wheelchair, Environmental consultant (specify type) ADL Screening (condition at time of admission) Patient's cognitive ability adequate to safely complete daily activities?: Yes Is the patient deaf or have difficulty hearing?: No Does the patient have difficulty seeing, even when wearing glasses/contacts?: No Does the patient have difficulty concentrating, remembering, or  making decisions?: No Patient able to express need for assistance with ADLs?: Yes Does the patient have difficulty dressing or bathing?: No Independently performs ADLs?: Yes (appropriate for developmental age) Does the patient have difficulty walking or climbing stairs?: No Weakness of Legs: None Weakness of Arms/Hands: None  Permission Sought/Granted                  Emotional Assessment     Affect (typically observed): Appropriate Orientation: : Oriented to Self, Oriented to Place, Oriented to  Time, Oriented to Situation Alcohol / Substance Use: Not Applicable Psych Involvement: No (comment)  Admission diagnosis:  Rib fractures [S22.49XA] Fall, initial encounter [W19.XXXA] Closed fracture of right tibial plateau, initial encounter [S82.141A] Closed fracture of multiple ribs of left side, initial encounter [S22.42XA] Patient Active Problem List   Diagnosis Date Noted   Rib fractures 06/11/2023   Right tibial fracture 06/11/2023   Acute diastolic CHF (congestive heart failure) (HCC) 05/29/2019   Itching 05/29/2019   Acute hypokalemia 05/05/2019   Acute CHF (congestive heart failure) (HCC) 04/08/2019   Acute CHF (HCC) 04/07/2019   Asthma, chronic, unspecified asthma severity, with acute exacerbation 04/07/2019   Benign essential HTN    CHF (congestive heart failure) (HCC) 07/26/2018   Transaminitis 07/23/2018   Acute on chronic respiratory failure with hypoxia and hypercapnia (HCC) 06/19/2018   Acute respiratory failure with hypoxia (HCC)    Supratherapeutic INR 05/03/2018  CHF exacerbation (HCC) 05/02/2018   Acute metabolic encephalopathy 05/02/2018   CKD (chronic kidney disease) stage 3, GFR 30-59 ml/min (HCC) 05/02/2018   Hypothyroidism 05/02/2018   Generalized weakness 05/02/2018   Physical deconditioning    Coronary artery disease of bypass graft of native heart with stable angina pectoris (HCC)    Renal failure (ARF), acute on chronic (HCC)    Acute on  chronic respiratory failure with hypercapnia (HCC) 03/24/2018   Hypomagnesemia 03/24/2018   Hypokalemia 03/24/2018   Hypophosphatemia 03/24/2018   Atrial fibrillation with RVR (HCC) 03/24/2018   Elevated troponin 03/24/2018   Coughing 01/14/2018   Anemia 01/14/2018   Hx of adenomatous colonic polyps 01/14/2018   Acute on chronic diastolic congestive heart failure (HCC) 12/17/2016   COPD with acute exacerbation (HCC) 12/17/2016   Bilateral leg edema 07/05/2014   Encounter for therapeutic drug monitoring 11/15/2013   Chronic venous insufficiency 08/26/2011   Chronic anticoagulation 01/11/2011   Peptic stricture of esophagus 10/04/2010   GERD 09/16/2010   DYSPHAGIA 09/16/2010   H/O heart valve replacement with mechanical valve 06/05/2009   Hyperlipidemia 06/04/2009   Coronary atherosclerosis of native coronary artery 06/04/2009   Atrial fibrillation, chronic (HCC) 06/04/2009   PCP:  Rebekah Chesterfield, NP Pharmacy:   [facility] - [facility], [place] - [address] [place] [address] Phone: [phone] Fax: [phone]     Social Determinants of Health (SDOH) Social History: SDOH Screenings   Food Insecurity: No Food Insecurity (06/11/2023)  Housing: Low Risk  (06/11/2023)  Transportation Needs: No Transportation Needs (06/11/2023)  Utilities: Not At Risk (06/11/2023)  Financial Resource Strain: Low Risk  (07/28/2018)  Physical Activity: Inactive (07/28/2018)  Social Connections: Somewhat Isolated (07/28/2018)  Stress: No Stress Concern Present (07/28/2018)  Tobacco Use: Medium Risk (06/11/2023)   SDOH Interventions:     Readmission Risk Interventions     No data to display

## 2023-06-12 NOTE — Progress Notes (Signed)
   06/12/23 1103  Spiritual Encounters  Type of Visit Initial  Care provided to: Patient  Referral source Clinical staff  Reason for visit Advance directives  OnCall Visit No   Chaplain responded to a consult to provide education for Advanced Care Directives to Pt. Chaplain was able to meet with Pt and provide education about how to fill the Advanced Directive form. Pt understood and shared with Chaplain that later she will fill out the form with her son, who lives with her. Chaplain asked Pt to call the Chaplain's office in case Pt had any question. Chaplain also asked Pt to notify the Chaplain's office when Pt is ready to sign the form, so Chaplain can bring a notary and witnesses to finalize the document. Chaplain exited the room. Pt was grateful for the information.

## 2023-06-12 NOTE — Progress Notes (Signed)
I went to apply DNR bracelet and after explaining what it meant, pt states she would like to be a full code  Dr. Gwenlyn Perking made aware this am, primary day nurse, Harriett Sine made aware as well.

## 2023-06-13 DIAGNOSIS — S82191D Other fracture of upper end of right tibia, subsequent encounter for closed fracture with routine healing: Secondary | ICD-10-CM | POA: Diagnosis not present

## 2023-06-13 LAB — PROTIME-INR
INR: 1.9 — ABNORMAL HIGH (ref 0.8–1.2)
Prothrombin Time: 22.3 s — ABNORMAL HIGH (ref 11.4–15.2)

## 2023-06-13 LAB — CBC
HCT: 30 % — ABNORMAL LOW (ref 36.0–46.0)
Hemoglobin: 9.4 g/dL — ABNORMAL LOW (ref 12.0–15.0)
MCH: 29 pg (ref 26.0–34.0)
MCHC: 31.3 g/dL (ref 30.0–36.0)
MCV: 92.6 fL (ref 80.0–100.0)
Platelets: 136 10*3/uL — ABNORMAL LOW (ref 150–400)
RBC: 3.24 MIL/uL — ABNORMAL LOW (ref 3.87–5.11)
RDW: 14.2 % (ref 11.5–15.5)
WBC: 6.9 10*3/uL (ref 4.0–10.5)
nRBC: 0 % (ref 0.0–0.2)

## 2023-06-13 MED ORDER — ACETAMINOPHEN 500 MG PO TABS
1000.0000 mg | ORAL_TABLET | Freq: Four times a day (QID) | ORAL | Status: DC
  Administered 2023-06-13 – 2023-06-24 (×37): 1000 mg via ORAL
  Filled 2023-06-13 (×40): qty 2

## 2023-06-13 MED ORDER — METHOCARBAMOL 500 MG PO TABS
500.0000 mg | ORAL_TABLET | Freq: Three times a day (TID) | ORAL | Status: DC
  Administered 2023-06-13 – 2023-06-16 (×8): 500 mg via ORAL
  Filled 2023-06-13 (×8): qty 1

## 2023-06-13 MED ORDER — WARFARIN SODIUM 2 MG PO TABS
2.0000 mg | ORAL_TABLET | Freq: Once | ORAL | Status: AC
  Administered 2023-06-13: 2 mg via ORAL
  Filled 2023-06-13: qty 1

## 2023-06-13 MED ORDER — DAPAGLIFLOZIN PROPANEDIOL 10 MG PO TABS
10.0000 mg | ORAL_TABLET | Freq: Every day | ORAL | Status: DC
  Administered 2023-06-13 – 2023-06-15 (×3): 10 mg via ORAL
  Filled 2023-06-13 (×3): qty 1

## 2023-06-13 NOTE — Progress Notes (Addendum)
PROGRESS NOTE   [name]  NUU:[id number]    DOB: Apr 24, 1951    DOA: 06/11/2023  PCP: Rebekah Chesterfield, NP   I have briefly reviewed patients previous medical records in [facility].  Chief Complaint  Patient presents with   Fall    Brief Narrative:  72 year old female with PMH of chronic diastolic CHF, chronic respiratory failure on home oxygen 2 L/min, atrial fibrillation and mechanical AVR on warfarin anticoagulation, COPD, presented to the ED on 8/22 following a fall with left-sided chest and right knee pain.  Admitted for nondisplaced left anterior 4-7 rib fractures, comminuted displaced fracture involving the right lateral tibial plateau.  Orthopedics consulted   Assessment & Plan:  Principal Problem:   Right tibial fracture Active Problems:   Rib fractures   Hypokalemia   Hypothyroidism   Atrial fibrillation, chronic (HCC)   H/O heart valve replacement with mechanical valve   Chronic anticoagulation   Coronary artery disease of bypass graft of native heart with stable angina pectoris (HCC)   CKD (chronic kidney disease) stage 3, GFR 30-59 ml/min (HCC)   CHF (congestive heart failure) (HCC)   Benign essential HTN   Right lateral tibial plateau fracture Orthopedic consultation appreciated.  Eventually will need surgery but not urgent pending medical optimization, good plan for her anticoagulation. Per orthopedics, she could even be discharged if stable and can be followed up in their clinic next week to discuss next steps. Nonweightbearing right lower extremity in knee immobilizer. Will get PT and OT evaluation.  Likely will need SNF. Will need cardiology clearance prior to surgery.  Acute blood loss anemia May be related to the tibial fracture Hemoglobin dropped from 12.1 on 8/22-9.4. Follow CBC and transfuse if hemoglobin 7 g or less  Thrombocytopenia Again could be due to acute blood loss Follow CBC.  Nondisplaced left anterior 4-7 rib  fractures Ongoing chest pain and poor inspiratory effort related to that Optimize nonopioid pain regimen, incentive spirometry encouraged.  Hypothyroidism -Continue Synthroid.   Hypokalemia Replaced.  Magnesium 2.2.   Benign essential HTN -Blood pressure is currently soft -Continue to follow vital signs. -Holding antihypertensive agents at the moment.   Chronic diastolic CHF (congestive heart failure) (HCC) -Stable and compensated.  Last echo 08/2022 EF of 55 to 60%. -Holding torsemide and beta-blocker in the setting of soft blood pressure -Continue to follow daily weights and strict I's and O's.   CKD (chronic kidney disease) stage 3, GFR 30-59 ml/min (HCC) -CKD 3B.   -For the most part appears to be stable -Continue to maintain adequate hydration -Continue to follow renal function trend.   Chronic anticoagulation for permanent A-fib and mechanical AVR As per cardiology office note on 03/30/2023, history of aortic stenosis, s/p Saint Jude mechanical AVR in 2007 along with graft repair of AAA. On warfarin for atrial fibrillation. -Continue holding bisoprolol and diltiazem in the setting of soft blood pressure.   H/O heart valve replacement with mechanical valve -On chronic anticoagulation with warfarin.  INR subtherapeutic at 1.9. -Order for Coumadin/INR per pharmacy. -INR is close to therapeutic (range 2-3)   Atrial fibrillation, chronic (HCC) -Rate controlled on anticoagulation with warfarin. -Blood pressure is soft currently -Continue holding diltiazem and bisoprolol -Continue telemetry monitoring.  Body mass index is 23.97 kg/m.    ACP Documents: None DVT prophylaxis:   On Coumadin anticoagulation   Code Status: Full Code:  Family Communication: None at bedside Disposition:  Status is: Inpatient Remains inpatient appropriate because: Nonweightbearing  Consultants:   Orthopedics  Procedures:     Antimicrobials:      Subjective:  Reports left  anterior rib cage pain, worse with deep inspiration and hence taking shallow breaths.  Right lower extremity pain on movement.  Objective:   Vitals:   06/12/23 1727 06/12/23 2116 06/13/23 0535 06/13/23 1300  BP:  110/70 (!) 93/57 102/67  Pulse:  77 77 74  Resp: 17 20 20    Temp:  98 F (36.7 C) 98 F (36.7 C) 98.3 F (36.8 C)  TempSrc:  Oral Oral   SpO2:  100% 96% 93%  Weight:   71.5 kg   Height:        General exam: Elderly female, small built and frail lying comfortably in bed Respiratory system: Clear to auscultation. Respiratory effort normal.  Midline sternotomy scar Cardiovascular system: S1 & S2 heard, irregularly irregular.  No JVD.  Click of mechanical AVR.  No pedal edema. Gastrointestinal system: Abdomen is nondistended, soft and nontender. No organomegaly or masses felt. Normal bowel sounds heard. Central nervous system: Alert and oriented. No focal neurological deficits. Extremities: Symmetric 5 x 5 power.  Right lower extremity in immobilizer.  Left ankle/foot with some chronic bruising changes Skin: No rashes, lesions or ulcers Psychiatry: Judgement and insight appear normal. Mood & affect appropriate.     Data Reviewed:   I have personally reviewed following labs and imaging studies   CBC: Recent Labs  Lab 06/11/23 1547 06/12/23 0431 06/13/23 0456  WBC 11.1* 7.7 6.9  NEUTROABS 8.6*  --   --   HGB 12.1 10.2* 9.4*  HCT 38.1 32.1* 30.0*  MCV 90.3 90.2 92.6  PLT 212 168 136*    Basic Metabolic Panel: Recent Labs  Lab 06/11/23 1547 06/12/23 0431  NA 139 136  K 3.1* 3.7  CL 97* 100  CO2 32 28  GLUCOSE 95 95  BUN 22 21  CREATININE 1.46* 1.54*  CALCIUM 9.4 8.4*  MG 2.2  --     Liver Function Tests: Recent Labs  Lab 06/11/23 1547  AST 33  ALT 20  ALKPHOS 69  BILITOT 0.8  PROT 7.8  ALBUMIN 4.2    CBG: No results for input(s): "GLUCAP" in the last 168 hours.  Microbiology Studies:  No results found for this or any previous visit  (from the past 240 hour(s)).  Radiology Studies:  CT Knee Right Wo Contrast  Result Date: 06/11/2023 CLINICAL DATA:  Tibial plateau fracture EXAM: CT OF THE RIGHT KNEE WITHOUT CONTRAST TECHNIQUE: Multidetector CT imaging of the right knee was performed according to the standard protocol. Multiplanar CT image reconstructions were also generated. RADIATION DOSE REDUCTION: This exam was performed according to the departmental dose-optimization program which includes automated exposure control, adjustment of the mA and/or kV according to patient size and/or use of iterative reconstruction technique. COMPARISON:  Knee x-ray 06/11/2023 earlier FINDINGS: Bones/Joint/Cartilage Osteopenia. There is a comminuted fracture of the lateral tibial plateau at the level of the joint space. There is a focal area of step-off with the depressed fragments approaching up to 2 mm at the level of the joint line medially. No additional fracture or dislocation. There are osteophytes seen of all 3 compartments as well. There is some joint space loss involving the patellofemoral joint particularly laterally. Small joint effusion. Ligaments Suboptimally assessed by CT. Muscles and Tendons Global muscle fatty atrophy identified. Soft tissues Soft tissue edema identified. IMPRESSION: Comminuted depressed fracture involving the lateral tibial plateau. Step-off of the level of  the joint line approaches 2 mm No additional fracture or dislocation. Severe osteopenia. Tricompartment degenerative changes. Small joint effusion. Electronically Signed   By: Karen Kays M.D.   On: 06/11/2023 19:10    Scheduled Meds:    dapagliflozin propanediol  10 mg Oral Daily   levothyroxine  50 mcg Oral Q0600   lumateperone tosylate  42 mg Oral Daily   rOPINIRole  3 mg Oral BID   warfarin  2 mg Oral ONCE-1600   Warfarin - Pharmacist Dosing Inpatient   Does not apply q1600    Continuous Infusions:     LOS: 2 days     Marcellus Scott, MD,  FACP,  FHM, SFHM, [facility], [facility]   [facility] Physician Advisor [place]      To contact the attending provider between 7A-7P or the covering provider during after hours 7P-7A, please log into the web site [email or web address] and access using universal [place] password for that web site. If you do not have the password, please call the hospital operator.  06/13/2023, 5:27 PM

## 2023-06-13 NOTE — Progress Notes (Signed)
ANTICOAGULATION CONSULT NOTE -  Pharmacy Consult for warfarin Indication: atrial fibrillation  Allergies  Allergen Reactions   Penicillin G Itching and Other (See Comments)   Penicillins Hives, Itching, Swelling and Rash    Patient Measurements: Height: 5\' 8"  (172.7 cm) Weight: 71.5 kg (157 lb 10.1 oz) IBW/kg (Calculated) : 63.9 Heparin Dosing Weight:   Vital Signs: Temp: 98 F (36.7 C) (08/24 0535) Temp Source: Oral (08/24 0535) BP: 93/57 (08/24 0535) Pulse Rate: 77 (08/24 0535)  Labs: Recent Labs    06/11/23 1547 06/12/23 0431 06/13/23 0456  HGB 12.1 10.2* 9.4*  HCT 38.1 32.1* 30.0*  PLT 212 168 136*  LABPROT 20.6* 22.7* 22.3*  INR 1.7* 2.0* 1.9*  CREATININE 1.46* 1.54*  --     Estimated Creatinine Clearance: 33.8 mL/min (A) (by C-G formula based on SCr of 1.54 mg/dL (H)).   Medical History: Past Medical History:  Diagnosis Date   Anxiety disorder    Aortic stenosis    Status post St. Jude mechanical AVR 2007   Asthma    Atrial fibrillation (HCC)    Carcinoid tumor of colon 2007   Chronic diastolic heart failure (HCC)    Coronary atherosclerosis of native coronary artery    Status post CABG 2007   GERD (gastroesophageal reflux disease)    History of colonoscopy 2003   Dr. 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 Jena Gauss, benign biopsies   RLS (restless legs syndrome)    Schatzki's ring     Medications:  Medications Prior to Admission  Medication Sig Dispense Refill Last Dose   ALPRAZolam (XANAX) 0.5 MG tablet Take 0.5 mg by mouth 2 (two) times daily.   06/11/2023   bisoprolol (ZEBETA) 5 MG tablet Take 5 mg by mouth daily.   06/11/2023   CAPLYTA 42 MG capsule Take 42 mg by mouth daily.   06/11/2023   diltiazem (CARDIZEM CD) 120 MG 24 hr capsule Take 1 capsule (120 mg total) by mouth daily. 30 capsule 2 06/11/2023   FARXIGA 10 MG TABS tablet Take 10 mg by mouth daily.    06/11/2023   HYDROcodone-acetaminophen (NORCO/VICODIN) 5-325 MG tablet Take 1 tablet by mouth 2 (two) times daily as needed for moderate pain or severe pain.   Past Week   ipratropium (ATROVENT) 0.06 % nasal spray Place 2 sprays into both nostrils in the morning and at bedtime.   06/11/2023   levothyroxine (SYNTHROID, LEVOTHROID) 50 MCG tablet Take 50 mcg by mouth daily before breakfast.    06/11/2023   Multiple Vitamin (MULTIVITAMIN WITH MINERALS) TABS tablet Take 1 tablet by mouth daily.   06/11/2023   omeprazole (PRILOSEC) 20 MG capsule Take 20 mg by mouth daily.   06/11/2023   OXYGEN Inhale 2 L into the lungs daily.    06/11/2023   rOPINIRole (REQUIP) 3 MG tablet Take 3 mg by mouth 2 (two) times daily.   06/11/2023   rosuvastatin (CRESTOR) 10 MG tablet Take 10 mg by mouth daily.   06/11/2023   torsemide (DEMADEX) 100 MG tablet Take 100 mg by mouth daily.   06/11/2023   warfarin (COUMADIN) 2 MG tablet Take 1.5-2 tablets (3-4 mg total) by mouth See admin instructions. 4mg  on Tuesday and 3 mg daily the rest of the week.   06/11/2023   bisoprolol (ZEBETA) 10 MG tablet Take 10 mg by mouth daily.  rosuvastatin (CRESTOR) 20 MG tablet TAKE ONE (1) TABLET BY MOUTH EVERY DAY 90 tablet 3     Assessment: Pharmacy consulted to dose warfarin in patient with atrial fibrillation.  Patient's home dose listed as 1 mg on Fridays and 2 mg ROW.   INR 2.0 > 1.9  Goal of Therapy:  INR 2-3 Monitor platelets by anticoagulation protocol: Yes   Plan:  Warfarin 2 mg x 1 dose. Daily INR   Judeth Cornfield, PharmD Clinical Pharmacist 06/13/2023 8:12 AM

## 2023-06-14 DIAGNOSIS — S82191D Other fracture of upper end of right tibia, subsequent encounter for closed fracture with routine healing: Secondary | ICD-10-CM | POA: Diagnosis not present

## 2023-06-14 LAB — PROTIME-INR
INR: 1.8 — ABNORMAL HIGH (ref 0.8–1.2)
Prothrombin Time: 21.1 s — ABNORMAL HIGH (ref 11.4–15.2)

## 2023-06-14 LAB — CBC
HCT: 30.7 % — ABNORMAL LOW (ref 36.0–46.0)
Hemoglobin: 9.5 g/dL — ABNORMAL LOW (ref 12.0–15.0)
MCH: 28.3 pg (ref 26.0–34.0)
MCHC: 30.9 g/dL (ref 30.0–36.0)
MCV: 91.4 fL (ref 80.0–100.0)
Platelets: 136 10*3/uL — ABNORMAL LOW (ref 150–400)
RBC: 3.36 MIL/uL — ABNORMAL LOW (ref 3.87–5.11)
RDW: 13.7 % (ref 11.5–15.5)
WBC: 5.2 10*3/uL (ref 4.0–10.5)
nRBC: 0 % (ref 0.0–0.2)

## 2023-06-14 MED ORDER — PANTOPRAZOLE SODIUM 40 MG PO TBEC
40.0000 mg | DELAYED_RELEASE_TABLET | Freq: Every day | ORAL | Status: DC
  Administered 2023-06-14 – 2023-06-24 (×10): 40 mg via ORAL
  Filled 2023-06-14 (×11): qty 1

## 2023-06-14 MED ORDER — ROSUVASTATIN CALCIUM 10 MG PO TABS
10.0000 mg | ORAL_TABLET | Freq: Every day | ORAL | Status: DC
  Administered 2023-06-14 – 2023-06-24 (×10): 10 mg via ORAL
  Filled 2023-06-14 (×11): qty 1

## 2023-06-14 MED ORDER — DIPHENHYDRAMINE HCL 25 MG PO CAPS
25.0000 mg | ORAL_CAPSULE | Freq: Once | ORAL | Status: AC
  Administered 2023-06-14: 25 mg via ORAL
  Filled 2023-06-14: qty 1

## 2023-06-14 MED ORDER — WARFARIN SODIUM 2.5 MG PO TABS
2.5000 mg | ORAL_TABLET | Freq: Once | ORAL | Status: AC
  Administered 2023-06-14: 2.5 mg via ORAL
  Filled 2023-06-14: qty 1

## 2023-06-14 NOTE — [place] FL2 (Signed)
[place] MEDICAID FL2 LEVEL OF CARE FORM     IDENTIFICATION  Patient Name: [name] Birthdate: Feb 09, 1951 Sex: female Admission Date (Current Location): 06/11/2023  [facility] and [place] Number:  Producer, television/film/video and Address:  [facility],  [address] [place] [address]      Provider Number: [id number]  Attending Physician Name and Address:  Elease Etienne, MD  Relative Name and Phone Number:  Lawernce Keas)  [phone]    Current Level of Care: Hospital Recommended Level of Care: Skilled Nursing Facility Prior Approval Number:    Date Approved/Denied:   PASRR Number: PENDING  Discharge Plan: SNF    Current Diagnoses: Patient Active Problem List   Diagnosis Date Noted   Rib fractures 06/11/2023   Right tibial fracture 06/11/2023   Acute diastolic CHF (congestive heart failure) (HCC) 05/29/2019   Itching 05/29/2019   Acute hypokalemia 05/05/2019   Acute CHF (congestive heart failure) (HCC) 04/08/2019   Acute CHF (HCC) 04/07/2019   Asthma, chronic, unspecified asthma severity, with acute exacerbation 04/07/2019   Benign essential HTN    CHF (congestive heart failure) (HCC) 07/26/2018   Transaminitis 07/23/2018   Acute on chronic respiratory failure with hypoxia and hypercapnia (HCC) 06/19/2018   Acute respiratory failure with hypoxia (HCC)    Supratherapeutic INR 05/03/2018   CHF exacerbation (HCC) 05/02/2018   Acute metabolic encephalopathy 05/02/2018   CKD (chronic kidney disease) stage 3, GFR 30-59 ml/min (HCC) 05/02/2018   Hypothyroidism 05/02/2018   Generalized weakness 05/02/2018   Physical deconditioning    Coronary artery disease of bypass graft of native heart with stable angina pectoris (HCC)    Renal failure (ARF), acute on chronic (HCC)    Acute on chronic respiratory failure with hypercapnia (HCC) 03/24/2018   Hypomagnesemia 03/24/2018   Hypokalemia 03/24/2018   Hypophosphatemia 03/24/2018   Atrial  fibrillation with RVR (HCC) 03/24/2018   Elevated troponin 03/24/2018   Coughing 01/14/2018   Anemia 01/14/2018   Hx of adenomatous colonic polyps 01/14/2018   Acute on chronic diastolic congestive heart failure (HCC) 12/17/2016   COPD with acute exacerbation (HCC) 12/17/2016   Bilateral leg edema 07/05/2014   Encounter for therapeutic drug monitoring 11/15/2013   Chronic venous insufficiency 08/26/2011   Chronic anticoagulation 01/11/2011   Peptic stricture of esophagus 10/04/2010   GERD 09/16/2010   DYSPHAGIA 09/16/2010   H/O heart valve replacement with mechanical valve 06/05/2009   Hyperlipidemia 06/04/2009   Coronary atherosclerosis of native coronary artery 06/04/2009   Atrial fibrillation, chronic (HCC) 06/04/2009    Orientation RESPIRATION BLADDER Height & Weight     Self, Time, Situation, Place  Normal Continent Weight: 157 lb 6.5 oz (71.4 kg) Height:  5\' 8"  (172.7 cm)  BEHAVIORAL SYMPTOMS/MOOD NEUROLOGICAL BOWEL NUTRITION STATUS      Continent Diet (Regular)  AMBULATORY STATUS COMMUNICATION OF NEEDS Skin   Extensive Assist Verbally Normal                       Personal Care Assistance Level of Assistance  Bathing, Feeding, Dressing Bathing Assistance: Maximum assistance Feeding assistance: Limited assistance Dressing Assistance: Maximum assistance     Functional Limitations Info  Sight, Hearing, Speech Sight Info: Adequate Hearing Info: Adequate Speech Info: Adequate    SPECIAL CARE FACTORS FREQUENCY  PT (By licensed PT), OT (By licensed OT)     PT Frequency: x5/week OT Frequency: x5/week            Contractures Contractures  Info: Not present    Additional Factors Info  Code Status, Allergies Code Status Info: Full Allergies Info: Penicillin G  Penicillins           Current Medications (06/14/2023):  This is the current hospital active medication list Current Facility-Administered Medications  Medication Dose Route Frequency Provider  Last Rate Last Admin   acetaminophen (TYLENOL) tablet 1,000 mg  1,000 mg Oral QID Hongalgi, Anand D, MD   1,000 mg at 06/14/23 1416   ALPRAZolam (XANAX) tablet 0.5 mg  0.5 mg Oral BID PRN Emokpae, Ejiroghene E, MD   0.5 mg at 06/12/23 2208   dapagliflozin propanediol (FARXIGA) tablet 10 mg  10 mg Oral Daily Adefeso, Oladapo, DO   10 mg at 06/14/23 0913   fentaNYL (SUBLIMAZE) injection 25 mcg  25 mcg Intravenous Q4H PRN Emokpae, Ejiroghene E, MD   25 mcg at 06/14/23 1416   levothyroxine (SYNTHROID) tablet 50 mcg  50 mcg Oral Q0600 Emokpae, Ejiroghene E, MD   50 mcg at 06/14/23 0537   lumateperone tosylate (CAPLYTA) capsule 42 mg  42 mg Oral Daily Vassie Loll, MD   42 mg at 06/13/23 1732   methocarbamol (ROBAXIN) tablet 500 mg  500 mg Oral TID Marcellus Scott D, MD   500 mg at 06/14/23 0912   ondansetron (ZOFRAN) tablet 4 mg  4 mg Oral Q6H PRN Emokpae, Ejiroghene E, MD       Or   ondansetron (ZOFRAN) injection 4 mg  4 mg Intravenous Q6H PRN Emokpae, Ejiroghene E, MD   4 mg at 06/14/23 1246   pantoprazole (PROTONIX) EC tablet 40 mg  40 mg Oral Daily Hongalgi, Theadora Rama D, MD   40 mg at 06/14/23 1416   polyethylene glycol (MIRALAX / GLYCOLAX) packet 17 g  17 g Oral Daily PRN Emokpae, Ejiroghene E, MD       rOPINIRole (REQUIP) tablet 3 mg  3 mg Oral BID Emokpae, Ejiroghene E, MD   3 mg at 06/14/23 0912   rosuvastatin (CRESTOR) tablet 10 mg  10 mg Oral Daily Marcellus Scott D, MD   10 mg at 06/14/23 1416   warfarin (COUMADIN) tablet 2.5 mg  2.5 mg Oral ONCE-1600 Hongalgi, Maximino Greenland, MD       Warfarin - Pharmacist Dosing Inpatient   Does not apply [id number] Vassie Loll, MD   Given at 06/13/23 1734     Discharge Medications: Please see discharge summary for a list of discharge medications.  Relevant Imaging Results:  Relevant Lab Results:   Additional Information SSN: [id number]  Princella Ion, LCSW

## 2023-06-14 NOTE — Progress Notes (Signed)
ANTICOAGULATION CONSULT NOTE -  Pharmacy Consult for warfarin Indication: atrial fibrillation  Allergies  Allergen Reactions   Penicillin G Itching and Other (See Comments)   Penicillins Hives, Itching, Swelling and Rash    Patient Measurements: Height: 5\' 8"  (172.7 cm) Weight: 71.4 kg (157 lb 6.5 oz) IBW/kg (Calculated) : 63.9 Heparin Dosing Weight:   Vital Signs: Temp: 98.5 F (36.9 C) (08/25 0536) BP: 107/70 (08/25 0536) Pulse Rate: 71 (08/25 0536)  Labs: Recent Labs    06/11/23 1547 06/12/23 0431 06/13/23 0456 06/14/23 0449  HGB 12.1 10.2* 9.4* 9.5*  HCT 38.1 32.1* 30.0* 30.7*  PLT 212 168 136* 136*  LABPROT 20.6* 22.7* 22.3* 21.1*  INR 1.7* 2.0* 1.9* 1.8*  CREATININE 1.46* 1.54*  --   --     Estimated Creatinine Clearance: 33.8 mL/min (A) (by C-G formula based on SCr of 1.54 mg/dL (H)).   Medical History: Past Medical History:  Diagnosis Date   Anxiety disorder    Aortic stenosis    Status post St. Jude mechanical AVR 2007   Asthma    Atrial fibrillation (HCC)    Carcinoid tumor of colon 2007   Chronic diastolic heart failure (HCC)    Coronary atherosclerosis of native coronary artery    Status post CABG 2007   GERD (gastroesophageal reflux disease)    History of colonoscopy 2003   Dr. 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 Jena Gauss, benign biopsies   RLS (restless legs syndrome)    Schatzki's ring     Medications:  Medications Prior to Admission  Medication Sig Dispense Refill Last Dose   ALPRAZolam (XANAX) 0.5 MG tablet Take 0.5 mg by mouth 2 (two) times daily.   06/11/2023   bisoprolol (ZEBETA) 5 MG tablet Take 5 mg by mouth daily.   06/11/2023   CAPLYTA 42 MG capsule Take 42 mg by mouth daily.   06/11/2023   diltiazem (CARDIZEM CD) 120 MG 24 hr capsule Take 1 capsule (120 mg total) by mouth daily. 30 capsule 2 06/11/2023   FARXIGA 10 MG TABS tablet Take 10 mg by  mouth daily.   06/11/2023   HYDROcodone-acetaminophen (NORCO/VICODIN) 5-325 MG tablet Take 1 tablet by mouth 2 (two) times daily as needed for moderate pain or severe pain.   Past Week   ipratropium (ATROVENT) 0.06 % nasal spray Place 2 sprays into both nostrils in the morning and at bedtime.   06/11/2023   levothyroxine (SYNTHROID, LEVOTHROID) 50 MCG tablet Take 50 mcg by mouth daily before breakfast.    06/11/2023   Multiple Vitamin (MULTIVITAMIN WITH MINERALS) TABS tablet Take 1 tablet by mouth daily.   06/11/2023   omeprazole (PRILOSEC) 20 MG capsule Take 20 mg by mouth daily.   06/11/2023   OXYGEN Inhale 2 L into the lungs daily.    06/11/2023   rOPINIRole (REQUIP) 3 MG tablet Take 3 mg by mouth 2 (two) times daily.   06/11/2023   rosuvastatin (CRESTOR) 10 MG tablet Take 10 mg by mouth daily.   06/11/2023   torsemide (DEMADEX) 100 MG tablet Take 100 mg by mouth daily.   06/11/2023   warfarin (COUMADIN) 2 MG tablet Take 1.5-2 tablets (3-4 mg total) by mouth See admin instructions. 4mg  on Tuesday and 3 mg daily the rest of the week.   06/11/2023   bisoprolol (ZEBETA) 10 MG tablet Take 10 mg by mouth  daily.      rosuvastatin (CRESTOR) 20 MG tablet TAKE ONE (1) TABLET BY MOUTH EVERY DAY 90 tablet 3     Assessment: Pharmacy consulted to dose warfarin in patient with atrial fibrillation.  Patient's home dose listed as 1 mg on Fridays and 2 mg ROW.   INR 2.0 > 1.9> 1.8  Goal of Therapy:  INR 2-3 Monitor platelets by anticoagulation protocol: Yes   Plan:  Warfarin 2.5 mg x 1 dose. Daily INR   Judeth Cornfield, PharmD Clinical Pharmacist 06/14/2023 8:38 AM

## 2023-06-14 NOTE — Progress Notes (Signed)
Patient has been alert this shift and able to verbalize needs. Patient has been compliant with oxygen use (NC2@liters ).  If nasal cannula is removed, patient oxygen saturations do decrease  to the upper 80s. Vitals have been stable.

## 2023-06-14 NOTE — Evaluation (Signed)
Physical Therapy Evaluation Patient Details Name: [name] MRN: [id number] DOB: 1951-03-01 Today's Date: 06/14/2023  History of Present Illness  Patient presented to the ED with complaints of a fall with left-sided chest and knee pain.  Patient got up onto a counter to clean when she lost her balance and fell in the bathroom.  Pt sustained a Rt tibial plateau fx and fx Left ribs 4-7  Clinical Impression  Pt did well with therapy.  Significant increased pain with sitting in rib area.  Pt able to sit x 5 minutes.  Will continue to improve pt I in mobility.  Pt agreeable to SNF>         If plan is discharge home, recommend the following: Assistance with cooking/housework;A lot of help with bathing/dressing/bathroom;Two people to help with walking and/or transfers   Can travel by private vehicle   No    Equipment Recommendations None recommended by PT  Recommendations for Other Services       Functional Status Assessment Patient has had a recent decline in their functional status and demonstrates the ability to make significant improvements in function in a reasonable and predictable amount of time.     Precautions / Restrictions Precautions Precautions: Fall Required Braces or Orthoses: Knee Immobilizer - Right Knee Immobilizer - Right: On when out of bed or walking Restrictions Weight Bearing Restrictions: Yes RLE Weight Bearing: Non weight bearing      Mobility  Bed Mobility Overal bed mobility: Needs Assistance Bed Mobility: Supine to Sit, Sit to Supine     Supine to sit: Min assist Sit to supine: Mod assist   General bed mobility comments: sat at bedside x 5 minute; ribs hurting to much to attempt standing          Pertinent Vitals/Pain Pain Assessment Pain Assessment: 0-10 Pain Score: 5  Pain Location: Lt ribs Pain Descriptors / Indicators: Aching Pain Intervention(s): Limited activity within patient's tolerance            Extremity/Trunk  Assessment        Lower Extremity Assessment Lower Extremity Assessment: RLE deficits/detail       Communication      Cognition Arousal: Alert Behavior During Therapy: WFL for tasks assessed/performed Overall Cognitive Status: Within Functional Limits for tasks assessed                                             Exercises General Exercises - Lower Extremity Ankle Circles/Pumps: Both, 10 reps Quad Sets: Both, 10 reps Gluteal Sets: Both, 10 reps Hip ABduction/ADduction: Both, 10 reps   Assessment/Plan    PT Assessment Patient needs continued PT services  PT Problem List Decreased strength;Decreased activity tolerance;Decreased mobility;Pain;Decreased knowledge of use of DME;Decreased knowledge of precautions       PT Treatment Interventions DME instruction;Therapeutic exercise;Functional mobility training;Gait training    PT Goals (Current goals can be found in the Care Plan section)  Acute Rehab PT Goals Patient Stated Goal: To be able to walk again PT Goal Formulation: With patient Time For Goal Achievement: 07/10/23 Potential to Achieve Goals: Good    Frequency Min 5X/week     AM-PAC PT "6 Clicks" Mobility  Outcome Measure Help needed turning from your back to your side while in a flat bed without using bedrails?: A Lot Help needed moving from lying on your back to sitting on  the side of a flat bed without using bedrails?: A Little Help needed moving to and from a bed to a chair (including a wheelchair)?: A Lot Help needed standing up from a chair using your arms (e.g., wheelchair or bedside chair)?: A Lot Help needed to walk in hospital room?: A Lot Help needed climbing 3-5 steps with a railing? : Total 6 Click Score: 12    End of Session   Activity Tolerance: Patient limited by pain Patient left: in bed;with call bell/phone within reach   PT Visit Diagnosis: Other abnormalities of gait and mobility (R26.89);Muscle weakness (generalized)  (M62.81);History of falling (Z91.81);Pain Pain - part of body: Knee (ribs)    Time: 0865-7846 PT Time Calculation (min) (ACUTE ONLY): 28 min   Charges:   PT Evaluation $PT Eval Moderate Complexity: 1 Mod PT Treatments $Therapeutic Exercise: 8-22 mins PT General Charges $$ ACUTE PT VISIT: 1 Visit          Virgina Organ, PT CLT [phone]  06/14/2023, 12:07 PM

## 2023-06-14 NOTE — Plan of Care (Signed)
  Problem: Acute Rehab PT Goals(only PT should resolve) Goal: Pt Will Go Supine/Side To Sit Flowsheets (Taken 06/14/2023 1205) Pt will go Supine/Side to Sit: with contact guard assist Goal: Pt Will Go Sit To Supine/Side Flowsheets (Taken 06/14/2023 1205) Pt will go Sit to Supine/Side: with contact guard assist Goal: Patient Will Transfer Sit To/From Stand Flowsheets (Taken 06/14/2023 1205) Patient will transfer sit to/from stand: with minimal assist Goal: Pt Will Ambulate Flowsheets (Taken 06/14/2023 1205) Pt will Ambulate:  10 feet  with moderate assist  with rolling walker

## 2023-06-14 NOTE — Progress Notes (Addendum)
PROGRESS NOTE   [name]  GMW:[id number]    DOB: July 22, 1951    DOA: 06/11/2023  PCP: Rebekah Chesterfield, NP   I have briefly reviewed patients previous medical records in [facility].  Chief Complaint  Patient presents with   Fall    Brief Narrative:  72 year old female with PMH of chronic diastolic CHF, chronic respiratory failure on home oxygen 2 L/min, atrial fibrillation and mechanical AVR on warfarin anticoagulation, COPD, presented to the ED on 8/22 following a fall with left-sided chest and right knee pain.  Admitted for nondisplaced left anterior 4-7 rib fractures, comminuted displaced fracture involving the right lateral tibial plateau.  Orthopedics consulted-communicated with Dr. Dallas Schimke 8/25, considering surgery for 8/29 pending discussion with patient.    8/26, TR[facility] MD to kindly consult cardiology for preop clearance and transition from Coumadin to IV heparin bridge pending procedure.   Assessment & Plan:  Principal Problem:   Right tibial fracture Active Problems:   Rib fractures   Hypokalemia   Hypothyroidism   Atrial fibrillation, chronic (HCC)   H/O heart valve replacement with mechanical valve   Chronic anticoagulation   Coronary artery disease of bypass graft of native heart with stable angina pectoris (HCC)   CKD (chronic kidney disease) stage 3, GFR 30-59 ml/min (HCC)   CHF (congestive heart failure) (HCC)   Benign essential HTN   Right lateral tibial plateau fracture Orthopedic consultation appreciated.  Eventually will need surgery but not urgent pending medical optimization, good plan for her anticoagulation. Per orthopedics, she could even be discharged if stable and can be followed up in their clinic next week to discuss next steps. Nonweightbearing right lower extremity in knee immobilizer. PT has seen and obviously recommend SNF. Given her significant pain with minimal movement of right lower extremity, nonweightbearing status on RLE, elderly  female with multiple comorbidities, high risk of morbidity and mortality with ongoing nonoperative management.  Thereby discussed with Dr. Dallas Schimke, orthopedics.  He we will discuss with patient tomorrow and is considering surgery on 8/29. Will need preop cardiology clearance, holding Coumadin and starting IV heparin bridge pending surgery.  Acute blood loss anemia May be related to the tibial fracture Hemoglobin dropped from 12.1 on 8/22-9.4.  Hemoglobin stable in the mid 9 g range over the last 48 hours. Follow CBC and transfuse if hemoglobin 7 g or less  Thrombocytopenia Again could be due to acute blood loss. Stable over the last 48 hours. Follow CBC.  Nondisplaced left anterior 4-7 rib fractures Ongoing chest pain and poor inspiratory effort related to that Optimize nonopioid pain regimen, incentive spirometry encouraged.  Hypothyroidism -Continue Synthroid.   Hypokalemia Replaced.  Magnesium 2.2.   Benign essential HTN -Blood pressure is currently soft -Monitor closely. -Holding antihypertensive agents at the moment.   Chronic diastolic CHF (congestive heart failure) (HCC) -Stable and compensated.  Last echo 08/2022 EF of 55 to 60%. -Holding torsemide and beta-blocker in the setting of soft blood pressure -Continue to follow daily weights and strict I's and O's.   CKD (chronic kidney disease) stage 3, GFR 30-59 ml/min (HCC) -CKD 3B.   -Creatinine stable in the 1.4-1.5 range -Monitor closely.  Avoid hypotension or nephrotoxic's.   Chronic anticoagulation for permanent A-fib and mechanical AVR As per cardiology office note on 03/30/2023, history of aortic stenosis, s/p St Jude mechanical AVR in 2007 along with graft repair of AAA. On warfarin for atrial fibrillation. -Continue holding bisoprolol and diltiazem in the setting of soft blood pressure. -  Refer to anticoagulation discussion above.   H/O heart valve replacement with mechanical aortic valve -INR 1.8.  On Coumadin  per pharmacy. -INR is close to therapeutic (range 2-3)   Atrial fibrillation, chronic (HCC) -Rate controlled - Anticoagulation with warfarin. -Blood pressure is soft currently -Continue holding diltiazem and bisoprolol -Continue telemetry monitoring.  GERD Continue PPI  Hyperlipidemia Patient/family unsure of dose of Crestor. Resumed lower-dose Crestor 10 Mg daily.  Body mass index is 23.93 kg/m.    ACP Documents: None DVT prophylaxis:   On Coumadin anticoagulation   Code Status: Full Code:  Family Communication: Discussed with patient son via phone, updated care and answered all questions. Disposition:  Status is: Inpatient Remains inpatient appropriate because: Nonweightbearing     Consultants:   Orthopedics  Procedures:     Antimicrobials:      Subjective:  No new complaints.  Just had a BM prior to my visit.  No pain when lying still but pain on movement of RLE.  No chest pain or dyspnea.  Objective:   Vitals:   06/13/23 2033 06/14/23 0500 06/14/23 0536 06/14/23 1339  BP: 109/65  107/70 100/67  Pulse: 73  71   Resp: 16  20   Temp: 98.2 F (36.8 C)  98.5 F (36.9 C) 98 F (36.7 C)  TempSrc:    Oral  SpO2: 97%  95% 93%  Weight:  71.4 kg    Height:        General exam: Elderly female, small built and frail lying comfortably in bed Respiratory system: Clear to auscultation. Respiratory effort normal.  Midline sternotomy scar Cardiovascular system: S1 & S2 heard, irregularly irregular.  No JVD.  Click of mechanical AVR.  No pedal edema.  Unchanged and stable. Gastrointestinal system: Abdomen is nondistended, soft and nontender. No organomegaly or masses felt. Normal bowel sounds heard. Central nervous system: Alert and oriented. No focal neurological deficits. Extremities: Symmetric 5 x 5 power.  Right lower extremity in immobilizer.  Left ankle/foot with some chronic bruising changes.  Unchanged and stable. Skin: No rashes, lesions or  ulcers Psychiatry: Judgement and insight appear normal. Mood & affect appropriate.     Data Reviewed:   I have personally reviewed following labs and imaging studies   CBC: Recent Labs  Lab 06/11/23 1547 06/12/23 0431 06/13/23 0456 06/14/23 0449  WBC 11.1* 7.7 6.9 5.2  NEUTROABS 8.6*  --   --   --   HGB 12.1 10.2* 9.4* 9.5*  HCT 38.1 32.1* 30.0* 30.7*  MCV 90.3 90.2 92.6 91.4  PLT 212 168 136* 136*    Basic Metabolic Panel: Recent Labs  Lab 06/11/23 1547 06/12/23 0431  NA 139 136  K 3.1* 3.7  CL 97* 100  CO2 32 28  GLUCOSE 95 95  BUN 22 21  CREATININE 1.46* 1.54*  CALCIUM 9.4 8.4*  MG 2.2  --     Liver Function Tests: Recent Labs  Lab 06/11/23 1547  AST 33  ALT 20  ALKPHOS 69  BILITOT 0.8  PROT 7.8  ALBUMIN 4.2    CBG: No results for input(s): "GLUCAP" in the last 168 hours.  Microbiology Studies:  No results found for this or any previous visit (from the past 240 hour(s)).  Radiology Studies:  No results found.  Scheduled Meds:    acetaminophen  1,000 mg Oral QID   dapagliflozin propanediol  10 mg Oral Daily   levothyroxine  50 mcg Oral Q0600   lumateperone tosylate  42 mg Oral  Daily   methocarbamol  500 mg Oral TID   rOPINIRole  3 mg Oral BID   warfarin  2.5 mg Oral ONCE-1600   Warfarin - Pharmacist Dosing Inpatient   Does not apply q1600    Continuous Infusions:     LOS: 3 days     Marcellus Scott, MD,  FACP, [facility], [facility], [facility], [facility]   [facility] Phys[facility] Advisor [place]     To contact the attending provider between 7A-7P or the covering provider during after hours 7P-7A, please log into the web site [email or web address] and access using universal [place] password for that web site. If you do not have the password, please call the hospital operator.  06/14/2023, 1:57 PM

## 2023-06-14 NOTE — TOC Progression Note (Signed)
Transition of Care [facility]) - Progression Note    Patient Details  Name: ARTI KOSE MRN: [id number] Date of Birth: 04-02-1951  Transition of Care [facility]) CM/SW Contact  Princella Ion, LCSW Phone Number: 06/14/2023, 3:44 PM  Clinical Narrative:    [facility] consulted for SNF. Per chart review, pt initially was not interested in rehab. CSW spoke to pt and son and pt is agreeable to rehab at time of discharge. Family prefers SNF in [place]. Encouraged pt and son to review Medicare.gov for official ratings. Will start FL2. Pt will need PASRR.    Expected Discharge Plan: Home/Self Care Barriers to Discharge: Continued Medical Work up  Expected Discharge Plan and Services       Living arrangements for the past 2 months: Single Family Home                                       Social Determinants of Health (SDOH) Interventions SDOH Screenings   Food Insecurity: No Food Insecurity (06/11/2023)  Housing: Low Risk  (06/11/2023)  Transportation Needs: No Transportation Needs (06/11/2023)  Utilities: Not At Risk (06/11/2023)  Financial Resource Strain: Low Risk  (07/28/2018)  Physical Activity: Inactive (07/28/2018)  Social Connections: Somewhat Isolated (07/28/2018)  Stress: No Stress Concern Present (07/28/2018)  Tobacco Use: Medium Risk (06/11/2023)    Readmission Risk Interventions     No data to display

## 2023-06-14 NOTE — Progress Notes (Signed)
PT has not been seen by ortho yet.  Therapy will wait until ortho consult is completed.   Virgina Organ, PT CLT [phone]

## 2023-06-15 DIAGNOSIS — I4821 Permanent atrial fibrillation: Secondary | ICD-10-CM | POA: Diagnosis not present

## 2023-06-15 DIAGNOSIS — J9611 Chronic respiratory failure with hypoxia: Secondary | ICD-10-CM | POA: Diagnosis not present

## 2023-06-15 DIAGNOSIS — Z01810 Encounter for preprocedural cardiovascular examination: Secondary | ICD-10-CM

## 2023-06-15 DIAGNOSIS — I5032 Chronic diastolic (congestive) heart failure: Secondary | ICD-10-CM | POA: Diagnosis not present

## 2023-06-15 DIAGNOSIS — S82121A Displaced fracture of lateral condyle of right tibia, initial encounter for closed fracture: Secondary | ICD-10-CM | POA: Diagnosis not present

## 2023-06-15 DIAGNOSIS — S82121D Displaced fracture of lateral condyle of right tibia, subsequent encounter for closed fracture with routine healing: Secondary | ICD-10-CM | POA: Diagnosis not present

## 2023-06-15 LAB — PROTIME-INR
INR: 2.1 — ABNORMAL HIGH (ref 0.8–1.2)
Prothrombin Time: 23.6 s — ABNORMAL HIGH (ref 11.4–15.2)

## 2023-06-15 MED ORDER — DIPHENHYDRAMINE HCL 25 MG PO CAPS
25.0000 mg | ORAL_CAPSULE | Freq: Once | ORAL | Status: AC
  Administered 2023-06-15: 25 mg via ORAL
  Filled 2023-06-15: qty 1

## 2023-06-15 MED ORDER — WARFARIN SODIUM 2 MG PO TABS: 2.0000 mg | ORAL_TABLET | Freq: Once | ORAL | Status: DC

## 2023-06-15 NOTE — Progress Notes (Addendum)
ANTICOAGULATION CONSULT NOTE -  Pharmacy Consult for Warfarin and Heparin Indication: atrial fibrillation and atrial valve replacement  Allergies  Allergen Reactions   Penicillin G Itching and Other (See Comments)   Penicillins Hives, Itching, Swelling and Rash    Patient Measurements: Height: 5\' 8"  (172.7 cm) Weight: 74.1 kg (163 lb 5.8 oz) IBW/kg (Calculated) : 63.9 Heparin Dosing Weight: 74.1 kg  Vital Signs: Temp: 98.4 F (36.9 C) (08/26 0434) Temp Source: Oral (08/26 0434) BP: 95/76 (08/26 0434) Pulse Rate: 70 (08/26 0434)  Labs: Recent Labs    06/13/23 0456 06/14/23 0449 06/15/23 0605  HGB 9.4* 9.5*  --   HCT 30.0* 30.7*  --   PLT 136* 136*  --   LABPROT 22.3* 21.1* 23.6*  INR 1.9* 1.8* 2.1*    Estimated Creatinine Clearance: 33.8 mL/min (A) (by C-G formula based on SCr of 1.54 mg/dL (H)).   Medical History: Past Medical History:  Diagnosis Date   Anxiety disorder    Aortic stenosis    Status post St. Jude mechanical AVR 2007   Asthma    Atrial fibrillation (HCC)    Carcinoid tumor of colon 2007   Chronic diastolic heart failure (HCC)    Coronary atherosclerosis of native coronary artery    Status post CABG 2007   GERD (gastroesophageal reflux disease)    History of colonoscopy 2003   Dr. 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 Jena Gauss, benign biopsies   RLS (restless legs syndrome)    Schatzki's ring     Medications:  Medications Prior to Admission  Medication Sig Dispense Refill Last Dose   ALPRAZolam (XANAX) 0.5 MG tablet Take 0.5 mg by mouth 2 (two) times daily.   06/11/2023   bisoprolol (ZEBETA) 5 MG tablet Take 5 mg by mouth daily.   06/11/2023   CAPLYTA 42 MG capsule Take 42 mg by mouth daily.   06/11/2023   diltiazem (CARDIZEM CD) 120 MG 24 hr capsule Take 1 capsule (120 mg total) by mouth daily. 30 capsule 2 06/11/2023   FARXIGA 10 MG TABS tablet Take 10 mg  by mouth daily.   06/11/2023   HYDROcodone-acetaminophen (NORCO/VICODIN) 5-325 MG tablet Take 1 tablet by mouth 2 (two) times daily as needed for moderate pain or severe pain.   Past Week   ipratropium (ATROVENT) 0.06 % nasal spray Place 2 sprays into both nostrils in the morning and at bedtime.   06/11/2023   levothyroxine (SYNTHROID, LEVOTHROID) 50 MCG tablet Take 50 mcg by mouth daily before breakfast.    06/11/2023   Multiple Vitamin (MULTIVITAMIN WITH MINERALS) TABS tablet Take 1 tablet by mouth daily.   06/11/2023   omeprazole (PRILOSEC) 20 MG capsule Take 20 mg by mouth daily.   06/11/2023   OXYGEN Inhale 2 L into the lungs daily.    06/11/2023   rOPINIRole (REQUIP) 3 MG tablet Take 3 mg by mouth 2 (two) times daily.   06/11/2023   rosuvastatin (CRESTOR) 10 MG tablet Take 10 mg by mouth daily.   06/11/2023   torsemide (DEMADEX) 100 MG tablet Take 100 mg by mouth daily.   06/11/2023   warfarin (COUMADIN) 2 MG tablet Take 1.5-2 tablets (3-4 mg total) by mouth See admin instructions. 4mg  on Tuesday and 3 mg daily the rest of the week.   06/11/2023   bisoprolol (ZEBETA) 10 MG tablet Take 10 mg by  mouth daily.      rosuvastatin (CRESTOR) 20 MG tablet TAKE ONE (1) TABLET BY MOUTH EVERY DAY 90 tablet 3     Assessment: Pharmacy consulted to dose warfarin and heparin in patient with atrial fibrillation and mechanical AVR. Patient's warfarin home dose listed as 1 mg on Fridays and 2 mg ROW. Planned surgery on 8/29.  INR 2.0 > 1.9> 1.8> 2.1 - therapeutic  Goal of Therapy:  INR 2-3 Monitor platelets by anticoagulation protocol: Yes   Plan:  Hold warfarin Start heparin when INR < 2 Daily INR  Tory Emerald PharmD Candidate 06/15/2023 8:06 AM

## 2023-06-15 NOTE — Progress Notes (Signed)
   ORTHOPAEDIC PROGRESS NOTE  Scheduled for Procedure(s): ORIF R tibial plateau fracture   DOS: 06/18/2023  SUBJECTIVE: Knee pain is improved, as long as she does not move it.  She has been able to stand with PT, but not take a step.  No numbness or tingling.   OBJECTIVE: PE:  Alert and oriented.  No acute distress  Right knee with minimal swelling. Lesions on lower leg Sensation intact distally Active motion EHL/TA Pain with motion   Vitals:   06/14/23 2117 06/15/23 0434  BP:  95/76  Pulse:  70  Resp: (!) 24 17  Temp:  98.4 F (36.9 C)  SpO2:  98%     ASSESSMENT: [name] is a 72 y.o. female stable.  Right tibial plateau fracture, isolated lateral depression  PLAN: Weightbearing: NWB RLE Incisional and dressing care: No incision or dressing at this time.  Orthopedic device(s):  Knee immobilizer VTE prophylaxis: Per medicine and cardiology; bridge from Coumadin prior to to surgery  Pain control: As needed Follow - up plan:  TBD  Has extensive discussion with the patient and her son regarding surgery.  All questions have been answered.  Ok to proceed with ORIF right tibial plateau fracture 06/18/2023 pending medical and cardiac clearance  Patient with need to be cleared prior to surgery by medicine and cardiology.  She will need to stop Coumadin prior to surgery, ok to bridge with heparin.  Unless there are complications, can resume Coumadin POD#1   Contact information:     Akiva Brassfield A. Dallas Schimke, MD MS [facility] [address] [place],  [place]  [address] Phone: [phone] Fax: [phone]

## 2023-06-15 NOTE — TOC Progression Note (Addendum)
Transition of Care [facility]) - Progression Note    Patient Details  Name: [name] MRN: [id number] Date of Birth: 1951/01/25  [facility]) CM/SW Contact  Adriell Polansky A , RN Phone Number: 06/15/2023, 12:43 PM  Clinical Narrative:    Spoke with son and informed of acceptance from [facility] and son was agreeable to SNF.  Will start Auth closer to discharge. PASARR completed.  Number is [phone] [id number].   Expected Discharge Plan: Home/Self Care Barriers to Discharge: Continued Medical Work up  Expected Discharge Plan and Services       Living arrangements for the past 2 months: Single Family Home                                       Social Determinants of Health (SDOH) Interventions SDOH Screenings   Food Insecurity: No Food Insecurity (06/11/2023)  Housing: Low Risk  (06/11/2023)  Transportation Needs: No Transportation Needs (06/11/2023)  Utilities: Not At Risk (06/11/2023)  Financial Resource Strain: Low Risk  (07/28/2018)  Physical Activity: Inactive (07/28/2018)  Social Connections: Somewhat Isolated (07/28/2018)  Stress: No Stress Concern Present (07/28/2018)  Tobacco Use: Medium Risk (06/11/2023)    Readmission Risk Interventions     No data to display

## 2023-06-15 NOTE — Consult Note (Signed)
Cardiology Consultation   Patient ID: [name] MRN: [id number]; DOB: 02-17-1951  Admit date: 06/11/2023 Date of Consult: 06/15/2023  PCP:  Rebekah Chesterfield, NP   [facility] Providers Cardiologist:  Nona Dell, MD   {    Patient Profile:   [name] is a 72 y.o. female with a hx of aortic stenosis with St Jude mechanical AVR in 2007 with ascending aortic aneurysm repair, chronic HFpEF, CAD with prior CABG, CKD 3, permanent afib on coumadin, chronic respiratory failure on 2L [place] who is being seen 06/15/2023 for preoperative cardiac evaluation  at the request of Dr Waymon Amato.  History of Present Illness:   [name] 72 yo female history of aortic stenosis with St Jude mechanical AVR in 2007 with ascending aortic aneurysm repair, chronic HFpEF, CAD with prior CABG, CKD 3, permanent afib on coumadin, chronic respiratory failure on 2L [place], admitted 06/11/23 with mechanical fall leading to right tibial fracture. Cardiology consulted for preoperative cardiac evaluation and also recommendations regarding coumadin management in setting of mechanical aortic valve with afib.  Reports highest level of activity is housework. Does mopping, sweeping, vacuuming. Sometimes will get SOB and rest, then resume the activity. Has O2 at home she wears intermittently, mainly in the evening times she reports.    Admit labs INR 1.7 wbc 11.1 Hgb 12.1 plt 212 Na 139 K 3.1 Cr 1.46 BUN 22 Mg 2.2 CXR: possible mild congestion CT head: no acute process EKG rate controlled afib, chronic ST/T changes   Jan 2023 echo: LVE 55-60%, no MAs, mild to mod RV dysfunction, severe LAE, mild MR, normal AVR   Past Medical History:  Diagnosis Date   Anxiety disorder    Aortic stenosis    Status post St. Jude mechanical AVR 2007   Asthma    Atrial fibrillation (HCC)    Carcinoid tumor of colon 2007   Chronic diastolic heart failure (HCC)    Coronary atherosclerosis of native coronary artery     Status post CABG 2007   GERD (gastroesophageal reflux disease)    History of colonoscopy 2003   Dr. 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 Jena Gauss, benign biopsies   RLS (restless legs syndrome)    Schatzki's ring     Past Surgical History:  Procedure Laterality Date   ABDOMINAL HYSTERECTOMY     AORTIC VALVE REPLACEMENT  2007   #25 mm St. Jude mechanical prosthesis with Hemashield tube graft repair of ascending aneurysm   APPENDECTOMY  2007   BACK SURGERY     lumbar 4 and 5    BIOPSY  02/05/2012   RMR:Two tongues of salmon-colored epithelium distal esophagus, very short-segment Barrett's s/p bx/Small hiatal hernia, otherwise normal stomach, D1, D2. Status post esophageal dilation. Biopsy showed GERD.   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 Jena Gauss- schatzki's ring, shoft peptic stricture at GE junction.   ESOPHAGOGASTRODUODENOSCOPY (EGD) WITH PROPOFOL N/A 02/08/2018   Procedure: ESOPHAGOGASTRODUODENOSCOPY (EGD) WITH PROPOFOL;  Surgeon:  Rourk, Gerrit Friends, MD;  Location: [facility];  Service: Endoscopy;  Laterality: N/A;  8:15am   FOOT SURGERY     bilateral bunionectomy   HERNIA REPAIR     with mesh   LAPAROTOMY  2007   small bowel resection secondary to small bowel obstruction   MALONEY DILATION  02/05/2012   Procedure: Elease Hashimoto DILATION;  Surgeon: Corbin Ade, MD;  Location: [facility];  Service: Endoscopy;  Laterality: N/A;  56mm    MALONEY DILATION N/A 02/08/2018   Procedure: Elease Hashimoto DILATION;  Surgeon: Corbin Ade, MD;  Location: [facility];  Service:  Endoscopy;  Laterality: N/A;   Teeth removal        Inpatient Medications: Scheduled Meds:  acetaminophen  1,000 mg Oral QID   dapagliflozin propanediol  10 mg Oral Daily   levothyroxine  50 mcg Oral Q0600   lumateperone tosylate  42 mg Oral Daily   methocarbamol  500 mg Oral TID   pantoprazole  40 mg Oral Daily   rOPINIRole  3 mg Oral BID   rosuvastatin  10 mg Oral Daily   Continuous Infusions:  PRN Meds: ALPRAZolam, fentaNYL (SUBLIMAZE) injection, ondansetron **OR** ondansetron (ZOFRAN) IV, polyethylene glycol  Allergies:    Allergies  Allergen Reactions   Penicillin G Itching and Other (See Comments)   Penicillins Hives, Itching, Swelling and Rash    Social History:   Social History   Socioeconomic History   Marital status: Married    Spouse name: Not on file   Number of children: 1   Years of education: stopped in 10th grade   Highest education level: GED or equivalent  Occupational History   Occupation: Retired  Tobacco Use   Smoking status: Former    Current packs/day: 0.00    Average packs/day: 1 pack/day for 20.0 years (20.0 ttl pk-yrs)    Types: Cigarettes    Start date: 10/20/1968    Quit date: 10/20/1988    Years since quitting: 34.6   Smokeless tobacco: Never  Vaping Use   Vaping status: Never Used  Substance and Sexual Activity   Alcohol use: No    Alcohol/week: 0.0 standard drinks of alcohol   Drug use: No   Sexual activity: Yes    Partners: Male    Birth control/protection: None    Comment: spouse  Other Topics Concern   Not on file  Social History Narrative   Not on file   Social Determinants of Health   Financial Resource Strain: Low Risk  (07/28/2018)   Overall Financial Resource Strain (CARDIA)    Difficulty of Paying Living Expenses: Not hard at all  Food Insecurity: No Food Insecurity (06/11/2023)   Hunger Vital Sign    Worried About Running Out of Food in the Last Year: Never true    Ran Out of Food in the Last Year: Never true   Transportation Needs: No Transportation Needs (06/11/2023)   PRAPARE - Transportation    Lack of Transportation (Medical): No    Lack of Transportation (Non-Medical): No  Physical Activity: Inactive (07/28/2018)   Exercise Vital Sign    Days of Exercise per Week: 0 days    Minutes of Exercise per Session: 0 min  Stress: No Stress Concern Present (07/28/2018)   [employer] of Occupational Health - Occupational Stress Questionnaire    Feeling of Stress : Not at all  Social Connections: Somewhat Isolated (07/28/2018)   Social Connection and Isolation Panel [NHANES]    Frequency of Communication  with Friends and Family: Never    Frequency of Social Gatherings with Friends and Family: Never    Attends Religious Services: More than 4 times per year    Active Member of [employer] or Organizations: No    Attends Banker Meetings: Never    Marital Status: Married  Catering manager Violence: Not At Risk (06/11/2023)   Humiliation, Afraid, Rape, and Kick questionnaire    Fear of Current or Ex-Partner: No    Emotionally Abused: No    Physically Abused: No    Sexually Abused: No    Family History:    Family History  Problem Relation Age of Onset   Stroke Mother    Cancer Father    Anesthesia problems Neg Hx    Hypotension Neg Hx    Malignant hyperthermia Neg Hx    Pseudochol deficiency Neg Hx      ROS:  Please see the history of present illness.   All other ROS reviewed and negative.     Physical Exam/Data:   Vitals:   06/14/23 2056 06/14/23 2117 06/15/23 0433 06/15/23 0434  BP: 108/76   95/76  Pulse: 63   70  Resp:  (!) 24  17  Temp: 97.8 F (36.6 C)   98.4 F (36.9 C)  TempSrc: Oral   Oral  SpO2: 96%   98%  Weight:   74.1 kg   Height:        Intake/Output Summary (Last 24 hours) at 06/15/2023 1058 Last data filed at 06/14/2023 2230 Gross per 24 hour  Intake --  Output 1150 ml  Net -1150 ml      06/15/2023    4:33 AM 06/14/2023    5:00 AM 06/13/2023     5:35 AM  Last 3 Weights  Weight (lbs) 163 lb 5.8 oz 157 lb 6.5 oz 157 lb 10.1 oz  Weight (kg) 74.1 kg 71.4 kg 71.5 kg     Body mass index is 24.84 kg/m.  General:  Well nourished, well developed, in no acute distress HEENT: normal Neck: no JVD Vascular: No carotid bruits; Distal pulses 2+ bilaterally Cardiac:  irregular, mechanical S2 Lungs:  clear to auscultation bilaterally, no wheezing, rhonchi or rales  Abd: soft, nontender, no hepatomegaly  Ext: no edema Musculoskeletal:  No deformities, BUE and BLE strength normal and equal Skin: warm and dry  Neuro:  CNs 2-12 intact, no focal abnormalities noted Psych:  Normal affect     Laboratory Data:  High Sensitivity Troponin:  No results for input(s): "TROPONINIHS" in the last 720 hours.   Chemistry Recent Labs  Lab 06/11/23 1547 06/12/23 0431  NA 139 136  K 3.1* 3.7  CL 97* 100  CO2 32 28  GLUCOSE 95 95  BUN 22 21  CREATININE 1.46* 1.54*  CALCIUM 9.4 8.4*  MG 2.2  --   GFRNONAA 38* 36*  ANIONGAP 10 8    Recent Labs  Lab 06/11/23 1547  PROT 7.8  ALBUMIN 4.2  AST 33  ALT 20  ALKPHOS 69  BILITOT 0.8   Lipids No results for input(s): "CHOL", "TRIG", "HDL", "LABVLDL", "LDLCALC", "CHOLHDL" in the last 168 hours.  Hematology Recent Labs  Lab 06/12/23 0431 06/13/23 0456 06/14/23 0449  WBC 7.7 6.9 5.2  RBC 3.56* 3.24* 3.36*  HGB 10.2* 9.4* 9.5*  HCT 32.1* 30.0* 30.7*  MCV 90.2 92.6 91.4  MCH 28.7 29.0 28.3  MCHC 31.8 31.3 30.9  RDW 14.1 14.2 13.7  PLT 168 136* 136*  Thyroid No results for input(s): "TSH", "FREET4" in the last 168 hours.  BNPNo results for input(s): "BNP", "PROBNP" in the last 168 hours.  DDimer No results for input(s): "DDIMER" in the last 168 hours.   Radiology/Studies:  CT Knee Right Wo Contrast  Result Date: 06/11/2023 CLINICAL DATA:  Tibial plateau fracture EXAM: CT OF THE RIGHT KNEE WITHOUT CONTRAST TECHNIQUE: Multidetector CT imaging of the right knee was performed according to  the standard protocol. Multiplanar CT image reconstructions were also generated. RADIATION DOSE REDUCTION: This exam was performed according to the departmental dose-optimization program which includes automated exposure control, adjustment of the mA and/or kV according to patient size and/or use of iterative reconstruction technique. COMPARISON:  Knee x-ray 06/11/2023 earlier FINDINGS: Bones/Joint/Cartilage Osteopenia. There is a comminuted fracture of the lateral tibial plateau at the level of the joint space. There is a focal area of step-off with the depressed fragments approaching up to 2 mm at the level of the joint line medially. No additional fracture or dislocation. There are osteophytes seen of all 3 compartments as well. There is some joint space loss involving the patellofemoral joint particularly laterally. Small joint effusion. Ligaments Suboptimally assessed by CT. Muscles and Tendons Global muscle fatty atrophy identified. Soft tissues Soft tissue edema identified. IMPRESSION: Comminuted depressed fracture involving the lateral tibial plateau. Step-off of the level of the joint line approaches 2 mm No additional fracture or dislocation. Severe osteopenia. Tricompartment degenerative changes. Small joint effusion. Electronically Signed   By: Karen Kays M.D.   On: 06/11/2023 19:10   CT CHEST ABDOMEN PELVIS WO CONTRAST  Result Date: 06/11/2023 CLINICAL DATA:  Trauma.  Hit left ribs. EXAM: CT CHEST, ABDOMEN AND PELVIS WITHOUT CONTRAST TECHNIQUE: Multidetector CT imaging of the chest, abdomen and pelvis was performed following the standard protocol without IV contrast. RADIATION DOSE REDUCTION: This exam was performed according to the departmental dose-optimization program which includes automated exposure control, adjustment of the mA and/or kV according to patient size and/or use of iterative reconstruction technique. COMPARISON:  CT abdomen and pelvis 04/20/2013 FINDINGS: CT CHEST FINDINGS  Cardiovascular: The aorta is ectatic. Bovine arch anatomy is noted. There are atherosclerotic calcifications of the aorta and coronary arteries. The heart is enlarged. There is no pericardial effusion. Patient is status post cardiac surgery. Mediastinum/Nodes: No enlarged mediastinal, hilar, or axillary lymph nodes. Thyroid gland, trachea, and esophagus demonstrate no significant findings. There is no pneumomediastinum or mediastinal hematoma. Lungs/Pleura: There some scattered peripheral reticular opacities throughout the lungs, greater on the right. There is no focal lung consolidation, pleural effusion or pneumothorax. Musculoskeletal: There are nondisplaced acute left anterior fourth, fifth, sixth and seventh rib fractures. There are additional healed bilateral rib fractures. Sternotomy wires are present. CT ABDOMEN PELVIS FINDINGS Hepatobiliary: No focal liver abnormality is seen. No gallstones, gallbladder wall thickening, or biliary dilatation. Pancreas: Unremarkable. No pancreatic ductal dilatation or surrounding inflammatory changes. Spleen: Normal in size without focal abnormality. Adrenals/Urinary Tract: Adrenal glands are unremarkable. There is no hydronephrosis or urinary tract calculus. Cortical cysts are seen in both kidneys measuring 1 cm or less. Bladder is unremarkable. Stomach/Bowel: Multiple bowel anastomoses are present. The appendix is not seen. There is no focal inflammation, pneumatosis or free air. There is no evidence for bowel obstruction. The stomach is within normal limits. Colonic diverticula are present. Vascular/Lymphatic: Aortic atherosclerosis. No enlarged abdominal or pelvic lymph nodes. Reproductive: Status post hysterectomy. No adnexal masses. Other: No abdominal wall hernia or abnormality. No abdominopelvic ascites. Musculoskeletal: The bones  are osteopenic. There is moderate compression deformity of L2 which is new from 2014. This is favored as chronic, but age indeterminate.  There is mild retropulsion of fracture fragments without significant central spinal canal stenosis. IMPRESSION: 1. Acute nondisplaced left anterior fourth through seventh rib fractures. No pneumothorax. 2. Moderate compression deformity of L2 is new from 2014. This is favored as chronic, but age indeterminate. 3. No acute localizing process in the abdomen or pelvis. 4. Scattered peripheral reticular opacities throughout the lungs, greater on the right, may represent chronic interstitial lung disease. 5. Cardiomegaly. 6. Bosniak I benign renal cyst measuring 1 cm. No follow-up imaging is recommended. JACR 2018 Feb; 264-273, Management of the Incidental Renal Mass on CT, [employer] 2021; 814-848, Bosniak Classification of Cystic Renal Masses, Version 2019. Aortic Atherosclerosis (ICD10-I70.0). Electronically Signed   By: Darliss Cheney M.D.   On: 06/11/2023 16:45   CT Head Wo Contrast  Result Date: 06/11/2023 CLINICAL DATA:  Polytrauma, blunt fall EXAM: CT HEAD WITHOUT CONTRAST CT CERVICAL SPINE WITHOUT CONTRAST TECHNIQUE: Multidetector CT imaging of the head and cervical spine was performed following the standard protocol without intravenous contrast. Multiplanar CT image reconstructions of the cervical spine were also generated. RADIATION DOSE REDUCTION: This exam was performed according to the departmental dose-optimization program which includes automated exposure control, adjustment of the mA and/or kV according to patient size and/or use of iterative reconstruction technique. COMPARISON:  None Available. FINDINGS: CT HEAD FINDINGS Brain: No evidence of acute infarction, hemorrhage, hydrocephalus, extra-axial collection or mass lesion/mass effect. Scattered low-density changes within the periventricular and subcortical white matter most compatible with chronic microvascular ischemic change. Mild diffuse cerebral volume loss. Vascular: Atherosclerotic calcifications involving the large vessels of the skull  base. No unexpected hyperdense vessel. Skull: Normal. Negative for fracture or focal lesion. Sinuses/Orbits: No acute finding. Other: Negative for scalp hematoma. CT CERVICAL SPINE FINDINGS Alignment: Facet joints are aligned without dislocation or traumatic listhesis. Dens and lateral masses are aligned. Skull base and vertebrae: No acute fracture. No primary bone lesion or focal pathologic process. Soft tissues and spinal canal: No prevertebral fluid or swelling. No visible canal hematoma. Disc levels: Disc heights are relatively well preserved. Minor facet spurring. Upper chest: See dedicated CT chest, abdomen, pelvis for full assessment of the intrathoracic findings. Other: Bilateral carotid atherosclerosis. IMPRESSION: 1. No acute intracranial abnormality. 2. No acute fracture or subluxation of the cervical spine. Electronically Signed   By: Duanne Guess D.O.   On: 06/11/2023 16:37   CT Cervical Spine Wo Contrast  Result Date: 06/11/2023 CLINICAL DATA:  Polytrauma, blunt fall EXAM: CT HEAD WITHOUT CONTRAST CT CERVICAL SPINE WITHOUT CONTRAST TECHNIQUE: Multidetector CT imaging of the head and cervical spine was performed following the standard protocol without intravenous contrast. Multiplanar CT image reconstructions of the cervical spine were also generated. RADIATION DOSE REDUCTION: This exam was performed according to the departmental dose-optimization program which includes automated exposure control, adjustment of the mA and/or kV according to patient size and/or use of iterative reconstruction technique. COMPARISON:  None Available. FINDINGS: CT HEAD FINDINGS Brain: No evidence of acute infarction, hemorrhage, hydrocephalus, extra-axial collection or mass lesion/mass effect. Scattered low-density changes within the periventricular and subcortical white matter most compatible with chronic microvascular ischemic change. Mild diffuse cerebral volume loss. Vascular: Atherosclerotic calcifications  involving the large vessels of the skull base. No unexpected hyperdense vessel. Skull: Normal. Negative for fracture or focal lesion. Sinuses/Orbits: No acute finding. Other: Negative for scalp hematoma. CT CERVICAL SPINE FINDINGS Alignment:  Facet joints are aligned without dislocation or traumatic listhesis. Dens and lateral masses are aligned. Skull base and vertebrae: No acute fracture. No primary bone lesion or focal pathologic process. Soft tissues and spinal canal: No prevertebral fluid or swelling. No visible canal hematoma. Disc levels: Disc heights are relatively well preserved. Minor facet spurring. Upper chest: See dedicated CT chest, abdomen, pelvis for full assessment of the intrathoracic findings. Other: Bilateral carotid atherosclerosis. IMPRESSION: 1. No acute intracranial abnormality. 2. No acute fracture or subluxation of the cervical spine. Electronically Signed   By: Duanne Guess D.O.   On: 06/11/2023 16:37   DG Knee Complete 4 Views Right  Result Date: 06/11/2023 CLINICAL DATA:  Fall.  Right knee pain. EXAM: RIGHT KNEE - COMPLETE 4+ VIEW COMPARISON:  None Available. FINDINGS: There is diffuse decreased bone mineralization. There appears to be 1 mm lucent cortical defect within the central aspect of the lateral tibial plateau. On oblique view there is a more distinct 4 mm craniocaudal depth fracture line lucency in this region at the junction of the lateral tibial spine and lateral tibial plateau. There is up to approximately 4 mm depression of the more lateral aspect of the lateral tibial plateau with additional mild subcortical lucency suspicious for an acute fracture. Moderate distal lateral tibial plateau degenerative osteophytosis. Moderate patellofemoral joint space narrowing and peripheral osteophytosis. Tiny joint effusion. Surgical clips overlie the medial knee soft tissues. IMPRESSION: 1. Acute fracture of the junction of the lateral tibial spine and lateral tibial plateau.  There is up to 4 mm depression of the more lateral aspect of lateral tibial plateau which may be acute or chronic. There do appear to be some subcortical acute fracture lines in this region of the lateral aspect of the lateral tibial plateau. 2. Moderate patellofemoral osteoarthritis. Electronically Signed   By: Neita Garnet M.D.   On: 06/11/2023 16:32   DG Chest 2 View  Result Date: 06/11/2023 CLINICAL DATA:  Rib pain, fall left-sided rib pain under the breast with inspiration EXAM: CHEST - 2 VIEW COMPARISON:  05/28/2019, CT 07/13/2019 FINDINGS: Post sternotomy changes and valve prosthesis. Enlarged cardiomediastinal silhouette with probable mild vascular congestion. Mild chronic pleural thickening on the right. Age indeterminate left lower ninth and tenth anterolateral rib deformities. Chronic upper lumbar compression deformity. IMPRESSION: 1. Age indeterminate left lower ninth and tenth anterolateral rib deformities. 2. Enlarged cardiomediastinal silhouette with probable mild vascular congestion. Electronically Signed   By: Jasmine Pang M.D.   On: 06/11/2023 15:55     Assessment and Plan:   1.Permanent afib - home bisoprolol and dilt held due to soft bp's, off av nodal agents HRs remain controlled. Monitor at this time - coumadin on hold, to start heparin gtt with plans for ortho surgery within the next few days. Bridging is indicated in setting of mechanical AV and afib.  2. History of aortic stenosis s/p mechanical AVR - normal function by echo last year - ok to hold coumadin for ortho surgery. With mechanical valve and afib bridging is indicated, pharmacy has been consulted to start heparin gtt. Resume after surgery when ok from surgical standpoint, would need bridging until INR is 2-3 range.    3. Chronic HFpEF - appears euvolemic, with soft bp's home diuretic on hold. Monitor volume status.  -hold her farxiga in preperation for surgery  4. Preoperative evaluation - plans for ortho  surgery for tibial fracture - her chronic cardiac conditions are stable - chronically tolerates <4 METs related to advanced  age and chronic medical conditions -she is at increased risk but not prohibitive risk for surgery. In this setting no additional cardiac testing or intervention is indicated. Recommend proceeding with surgery as planned.    No additional cardiology recs, we will sign off inpatient care.     For questions or updates, please contact [facility] Please consult [email or web address] for contact info under    Signed, Dina Rich, MD  06/15/2023 10:58 AM

## 2023-06-15 NOTE — Progress Notes (Signed)
PROGRESS NOTE   [name]  ZOX:[id number]    DOB: Dec 08, 1950    DOA: 06/11/2023  PCP: Rebekah Chesterfield, NP   I have briefly reviewed patients previous medical records in [facility].  Chief Complaint  Patient presents with   Fall    Brief Narrative:  72 year old female with PMH of chronic diastolic CHF, chronic respiratory failure on home oxygen 2 L/min, atrial fibrillation and mechanical AVR on warfarin anticoagulation, COPD, presented to the ED on 8/22 following a fall with left-sided chest and right knee pain.  Admitted for nondisplaced left anterior 4-7 rib fractures, comminuted displaced fracture involving the right lateral tibial plateau.  Orthopedics consulted and plan ORIF on 06/18/2023.  Cardiology consulted and provided preop clearance.   Assessment & Plan:  Principal Problem:   Right tibial fracture Active Problems:   Rib fractures   Hypokalemia   Hypothyroidism   Atrial fibrillation, chronic (HCC)   H/O heart valve replacement with mechanical valve   Chronic anticoagulation   Coronary artery disease of bypass graft of native heart with stable angina pectoris (HCC)   CKD (chronic kidney disease) stage 3, GFR 30-59 ml/min (HCC)   CHF (congestive heart failure) (HCC)   Benign essential HTN   Right lateral tibial plateau fracture Orthopedic consultation appreciated.   Initial plans were to consider elective surgery admission as an outpatient. Now ORIF planned for 06/18/2023.   In preparation for surgery, Coumadin will be discontinued and bridged with IV heparin for underlying history of A-fib and mechanical AVR.  Postop IV heparin bridging to be resumed as soon as able and continued until INR therapeutic between 2 and 3.   Cardiology consulted, input appreciated, patient at increased risk but not prohibitive risk for surgery and recommended proceeding with surgery with no further cardiac testing or intervention. Agree that patient overall is at increased risk  from very advanced age, O2 dependent chronic respiratory failure, extensive cardiac history as noted but appears optimized as best as possible from those aspects and may proceed with indicated surgery to minimize further morbidity, with close perioperative monitoring, maintaining in and out even. Nonweightbearing right lower extremity in knee immobilizer. PT has seen and obviously recommend SNF.  Acute blood loss anemia May be related to the tibial fracture Hemoglobin dropped from 12.1 on 8/22-9.4.  Hemoglobin stable in the mid 9 g range over the last 48 hours. Follow CBC and transfuse if hemoglobin 7 g or less  Thrombocytopenia Again could be due to acute blood loss. Stable over the last 48 hours. Will check CBC in AM.  Nondisplaced left anterior 4-7 rib fractures Has not complained of chest discomfort over the last 48 hours. Optimize nonopioid pain regimen, incentive spirometry encouraged.  Hypothyroidism -Continue Synthroid.   Hypokalemia Replaced.  Magnesium 2.2.   Benign essential HTN -Blood pressure is labile between soft and normal -Monitor closely. -Holding antihypertensive agents at the moment.   Chronic diastolic CHF (congestive heart failure) (HCC) -Stable and compensated despite holding of diuretics.  Last echo 08/2022 EF of 55 to 60%. -Holding torsemide and beta-blocker in the setting of soft blood pressure -Continue to follow daily weights and strict I's and O's.   CKD (chronic kidney disease) stage 3b, GFR 30-59 ml/min (HCC) -Creatinine stable in the 1.4-1.5 range -Monitor closely.  Avoid hypotension or nephrotoxic's.   Chronic anticoagulation for permanent A-fib and mechanical AVR As per cardiology office note on 03/30/2023, history of aortic stenosis, s/p St Jude mechanical AVR in 2007 along with  graft repair of AAA. On warfarin for atrial fibrillation. -Continue holding bisoprolol and diltiazem in the setting of soft blood pressure. -Starting IV heparin  bridging, holding warfarin in preparation for upcoming surgery   H/O heart valve replacement with mechanical aortic valve -INR range 2-3 when back on Coumadin -Anticoagulation discussion above   Atrial fibrillation, chronic (HCC) -Rate controlled -Anticoagulation discussion as above -Blood pressure is soft currently -Continue holding diltiazem and bisoprolol -Continue telemetry monitoring.  GERD Continue PPI  Hyperlipidemia Patient/family unsure of dose of Crestor. Resumed lower-dose Crestor 10 Mg daily.  Body mass index is 24.84 kg/m.    ACP Documents: None DVT prophylaxis:   On Coumadin anticoagulation   Code Status: Full Code:  Family Communication: Son at bedside. Disposition:  Status is: Inpatient Remains inpatient appropriate because: Awaiting surgery 8/29.  May be ready for DC to SNF early next week     Consultants:   Orthopedics Cardiology  Procedures:     Antimicrobials:      Subjective:  Reiterates that she does not have pain if she does not "twists her leg".  My breathing is "fine".  No other complaints reported.  Objective:   Vitals:   06/14/23 2056 06/14/23 2117 06/15/23 0433 06/15/23 0434  BP: 108/76   95/76  Pulse: 63   70  Resp:  (!) 24  17  Temp: 97.8 F (36.6 C)   98.4 F (36.9 C)  TempSrc: Oral   Oral  SpO2: 96%   98%  Weight:   74.1 kg   Height:        General exam: Elderly female, small built and frail lying comfortably in bed Respiratory system: Clear to auscultation. Respiratory effort normal.  Midline sternotomy scar Cardiovascular system: S1 & S2 heard, irregularly irregular.  No JVD.  Click of mechanical AVR.  No pedal edema.  Telemetry personally reviewed: A-fib with controlled and variable rate. Gastrointestinal system: Abdomen is nondistended, soft and nontender. No organomegaly or masses felt. Normal bowel sounds heard. Central nervous system: Alert and oriented. No focal neurological deficits. Extremities: Symmetric  5 x 5 power.  Right lower extremity in immobilizer.  Left ankle/foot with some chronic bruising changes.  Unchanged and stable. Skin: No rashes, lesions or ulcers Psychiatry: Judgement and insight appear normal. Mood & affect appropriate.     Data Reviewed:   I have personally reviewed following labs and imaging studies   CBC: Recent Labs  Lab 06/11/23 1547 06/12/23 0431 06/13/23 0456 06/14/23 0449  WBC 11.1* 7.7 6.9 5.2  NEUTROABS 8.6*  --   --   --   HGB 12.1 10.2* 9.4* 9.5*  HCT 38.1 32.1* 30.0* 30.7*  MCV 90.3 90.2 92.6 91.4  PLT 212 168 136* 136*    Basic Metabolic Panel: Recent Labs  Lab 06/11/23 1547 06/12/23 0431  NA 139 136  K 3.1* 3.7  CL 97* 100  CO2 32 28  GLUCOSE 95 95  BUN 22 21  CREATININE 1.46* 1.54*  CALCIUM 9.4 8.4*  MG 2.2  --     Liver Function Tests: Recent Labs  Lab 06/11/23 1547  AST 33  ALT 20  ALKPHOS 69  BILITOT 0.8  PROT 7.8  ALBUMIN 4.2    CBG: No results for input(s): "GLUCAP" in the last 168 hours.  Microbiology Studies:  No results found for this or any previous visit (from the past 240 hour(s)).  Radiology Studies:  No results found.  Scheduled Meds:    acetaminophen  1,000 mg Oral  QID   levothyroxine  50 mcg Oral Q0600   lumateperone tosylate  42 mg Oral Daily   methocarbamol  500 mg Oral TID   pantoprazole  40 mg Oral Daily   rOPINIRole  3 mg Oral BID   rosuvastatin  10 mg Oral Daily    Continuous Infusions:     LOS: 4 days     Marcellus Scott, MD,  FACP, FHM, SFHM, [facility], [facility]   [facility] Advisor [place]     To contact the attending provider between 7A-7P or the covering provider during after hours 7P-7A, please log into the web site [email or web address] and access using universal [place] password for that web site. If you do not have the password, please call the hospital operator.  06/15/2023, 2:46 PM

## 2023-06-15 NOTE — Progress Notes (Signed)
Physical Therapy Treatment Patient Details Name: [name] MRN: [id number] DOB: [birth year] Today's Date: 06/15/2023   History of Present Illness Patient presented to the ED with complaints of a fall with left-sided chest and knee pain.  Patient got up onto a counter to clean when she lost her balance and fell in the bathroom.  Pt sustained a Rt tibial plateau fx and fx Left ribs 4-7    PT Comments  Pt supine in bed with son in room and willing  to participate.  Min A with bed mobility and transfer training.  Increased time and cueing for hand placement to assist with scooting to EOB and transfer training for NWB.  PT able to stand for 2 minutes prior fatigue.     If plan is discharge home, recommend the following:     Can travel by private vehicle        Equipment Recommendations       Recommendations for Other Services       Precautions / Restrictions Precautions Precautions: Fall Required Braces or Orthoses: Knee Immobilizer - Right Knee Immobilizer - Right: On when out of bed or walking Restrictions Weight Bearing Restrictions: Yes RLE Weight Bearing: Non weight bearing     Mobility  Bed Mobility Overal bed mobility: Needs Assistance Bed Mobility: Supine to Sit     Supine to sit: Min assist          Transfers Overall transfer level: Needs assistance Equipment used: Rolling walker (2 wheels) Transfers: Sit to/from Stand Sit to Stand: Min assist           General transfer comment: Cueing for handplacement to assist with standing; educated on NWB Rt LE upon standing    Ambulation/Gait                   Stairs             Wheelchair Mobility     Tilt Bed    Modified Rankin (Stroke Patients Only)       Balance                                            Cognition Arousal: Alert Behavior During Therapy: WFL for tasks assessed/performed Overall Cognitive Status: Within Functional Limits for tasks assessed                                           Exercises General Exercises - Lower Extremity Ankle Circles/Pumps: Both, 10 reps Quad Sets: Both, 10 reps Gluteal Sets: Both, 10 reps Long Arc Quad: AROM, Left, 10 reps, Seated    General Comments        Pertinent Vitals/Pain Pain Assessment Pain Score: 6  Pain Location: Lt ribs Pain Descriptors / Indicators: Aching Pain Intervention(s): Monitored during session, Repositioned, Limited activity within patient's tolerance    Home Living                          Prior Function            PT Goals (current goals can now be found in the care plan section)      Frequency           PT Plan  Co-evaluation              AM-PAC PT "6 Clicks" Mobility   Outcome Measure  Help needed turning from your back to your side while in a flat bed without using bedrails?: A Lot Help needed moving from lying on your back to sitting on the side of a flat bed without using bedrails?: A Little Help needed moving to and from a bed to a chair (including a wheelchair)?: A Lot Help needed standing up from a chair using your arms (e.g., wheelchair or bedside chair)?: A Lot Help needed to walk in hospital room?: A Lot Help needed climbing 3-5 steps with a railing? : Total 6 Click Score: 12    End of Session Equipment Utilized During Treatment:  (SBA, requested no gait belt due to rib pain) Activity Tolerance: Patient tolerated treatment well Patient left: in bed;with call bell/phone within reach;with family/visitor present   PT Visit Diagnosis: Other abnormalities of gait and mobility (R26.89);Muscle weakness (generalized) (M62.81);History of falling (Z91.81);Pain Pain - part of body: Knee (Ribs)     Time: 5868-2574 PT Time Calculation (min) (ACUTE ONLY): 28 min  Charges:    $Therapeutic Activity: 23-37 mins PT General Charges $$ ACUTE PT VISIT: 1 Visit                     Becky Sax, LPTA/CLT;  CBIS [phone]  Juel Burrow 06/15/2023, 9:54 AM

## 2023-06-15 NOTE — Care Management Important Message (Signed)
Important Message  Patient Details  Name: [name] MRN: [id number] Date of Birth: 01-18-1951   Medicare Important Message Given:  Yes     Corey Harold 06/15/2023, 10:24 AM

## 2023-06-16 DIAGNOSIS — S82121A Displaced fracture of lateral condyle of right tibia, initial encounter for closed fracture: Secondary | ICD-10-CM

## 2023-06-16 LAB — CBC
HCT: 30.3 % — ABNORMAL LOW (ref 36.0–46.0)
Hemoglobin: 9.6 g/dL — ABNORMAL LOW (ref 12.0–15.0)
MCH: 28.5 pg (ref 26.0–34.0)
MCHC: 31.7 g/dL (ref 30.0–36.0)
MCV: 89.9 fL (ref 80.0–100.0)
Platelets: 169 10*3/uL (ref 150–400)
RBC: 3.37 MIL/uL — ABNORMAL LOW (ref 3.87–5.11)
RDW: 13.9 % (ref 11.5–15.5)
WBC: 4.4 10*3/uL (ref 4.0–10.5)
nRBC: 0 % (ref 0.0–0.2)

## 2023-06-16 LAB — BASIC METABOLIC PANEL
Anion gap: 6 (ref 5–15)
BUN: 13 mg/dL (ref 8–23)
CO2: 27 mmol/L (ref 22–32)
Calcium: 8.5 mg/dL — ABNORMAL LOW (ref 8.9–10.3)
Chloride: 97 mmol/L — ABNORMAL LOW (ref 98–111)
Creatinine, Ser: 1.11 mg/dL — ABNORMAL HIGH (ref 0.44–1.00)
GFR, Estimated: 53 mL/min — ABNORMAL LOW (ref >60)
Glucose, Bld: 82 mg/dL (ref 70–99)
Potassium: 4.2 mmol/L (ref 3.5–5.1)
Sodium: 130 mmol/L — ABNORMAL LOW (ref 135–145)

## 2023-06-16 LAB — PROTIME-INR
INR: 2.3 — ABNORMAL HIGH (ref 0.8–1.2)
Prothrombin Time: 25.4 seconds — ABNORMAL HIGH (ref 11.4–15.2)

## 2023-06-16 MED ORDER — CEFAZOLIN SODIUM-DEXTROSE 2-4 GM/100ML-% IV SOLN
2.0000 g | INTRAVENOUS | Status: AC
  Administered 2023-06-18: 2 g via INTRAVENOUS
  Filled 2023-06-16: qty 100

## 2023-06-16 NOTE — Progress Notes (Signed)
Physical Therapy Treatment Patient Details Name: [name] MRN: [id number] DOB: 11-14-1950 Today's Date: 06/16/2023   History of Present Illness [name] is a 72 y.o. female with medical history significant for diastolic CHF, chronic respiratory failure on 2 L, atrial fibrillation, COPD.  Patient presented to the ED with complaints of a fall with left-sided chest and knee pain.  Patient got up onto a counter to clean when she lost her balance and fell in the bathroom, hitting the left side of her chest and her right leg following into the commode.  It took about 30 minutes to crawl to her phone to call her son.  She denies any chest pain, no dizziness, no change in breathing.  He is on warfarin and took her last dose this morning.  She is also on diuretics-torsemide    PT Comments  Patient demonstrates slow labored movement for sitting up at bedside with limited use of LUE due to constant guarding of left sided rib cage with left hand.  Patient unable to use RW for completing sit to stands mostly due to left sided rib cage pain and required stand pivot on LLE with fair/good return for keeping RLE off floor.  Patient tolerated sitting up in chair after therapy.  Patient will benefit from continued skilled physical therapy in hospital and recommended venue below to increase strength, balance, endurance for safe ADLs and gait.       If plan is discharge home, recommend the following: A lot of help with bathing/dressing/bathroom;A lot of help with walking and/or transfers;Help with stairs or ramp for entrance;Assistance with cooking/housework   Can travel by private vehicle     No  Equipment Recommendations  None recommended by PT    Recommendations for Other Services       Precautions / Restrictions Precautions Precautions: Fall Required Braces or Orthoses: Knee Immobilizer - Right Knee Immobilizer - Right: On when out of bed or walking Restrictions Weight Bearing Restrictions:  Yes RLE Weight Bearing: Non weight bearing     Mobility  Bed Mobility Overal bed mobility: Needs Assistance Bed Mobility: Supine to Sit     Supine to sit: Mod assist     General bed mobility comments: slow labored movement with limited use of LUE due to guarding/hold left hand against left rib cage    Transfers Overall transfer level: Needs assistance Equipment used: 1 person hand held assist Transfers: Sit to/from Stand, Bed to chair/wheelchair/BSC Sit to Stand: Mod assist, Min assist Stand pivot transfers: Mod assist, Min assist         General transfer comment: unable to transfer using RW due to constant guarding of left rib cage with left hand, required stand pivot on LLE with NWB on RLE    Ambulation/Gait                   Stairs             Wheelchair Mobility     Tilt Bed    Modified Rankin (Stroke Patients Only)       Balance Overall balance assessment: Needs assistance Sitting-balance support: Feet supported, No upper extremity supported Sitting balance-Leahy Scale: Fair Sitting balance - Comments: fair/good seated at EOB   Standing balance support: During functional activity, No upper extremity supported Standing balance-Leahy Scale: Poor Standing balance comment: bilateral hand held assist with NWB on RLE  Cognition Arousal: Alert Behavior During Therapy: WFL for tasks assessed/performed Overall Cognitive Status: Within Functional Limits for tasks assessed                                          Exercises General Exercises - Lower Extremity Ankle Circles/Pumps: Seated, AROM, Strengthening, Both, 10 reps    General Comments        Pertinent Vitals/Pain Pain Assessment Pain Assessment: Faces Faces Pain Scale: Hurts little more Pain Location: RLE and mostly on left side of rib cage Pain Descriptors / Indicators: Sore, Aching, Grimacing, Guarding, Discomfort Pain  Intervention(s): Limited activity within patient's tolerance, Monitored during session, Repositioned    Home Living                          Prior Function            PT Goals (current goals can now be found in the care plan section) Acute Rehab PT Goals Patient Stated Goal: To be able to walk again PT Goal Formulation: With patient Time For Goal Achievement: 07/10/23 Potential to Achieve Goals: Good Progress towards PT goals: Progressing toward goals    Frequency    Min 3X/week      PT Plan      Co-evaluation              AM-PAC PT "6 Clicks" Mobility   Outcome Measure  Help needed turning from your back to your side while in a flat bed without using bedrails?: A Lot Help needed moving from lying on your back to sitting on the side of a flat bed without using bedrails?: A Lot Help needed moving to and from a bed to a chair (including a wheelchair)?: A Lot Help needed standing up from a chair using your arms (e.g., wheelchair or bedside chair)?: A Lot Help needed to walk in hospital room?: Total Help needed climbing 3-5 steps with a railing? : Total 6 Click Score: 10    End of Session Equipment Utilized During Treatment: Oxygen Activity Tolerance: Patient tolerated treatment well;Patient limited by fatigue Patient left: in chair;with call bell/phone within reach Nurse Communication: Mobility status PT Visit Diagnosis: Unsteadiness on feet (R26.81);Other abnormalities of gait and mobility (R26.89);Muscle weakness (generalized) (M62.81) Pain - Right/Left: Right Pain - part of body: Knee (left rib cage)     Time: 1610-9604 PT Time Calculation (min) (ACUTE ONLY): 26 min  Charges:    $Therapeutic Activity: 23-37 mins PT General Charges $$ ACUTE PT VISIT: 1 Visit                     2:08 PM, 06/16/23 Ocie Bob, MPT Physical Therapist with [facility] [phone] office [phone] mobile phone

## 2023-06-16 NOTE — TOC Progression Note (Addendum)
Transition of Care [facility]) - Progression Note    Patient Details  Name: SHONIA KELLERMANN MRN: [id number] Date of Birth: [birth year]  [facility]) CM/SW Contact  Elizabelle Fite A , RN Phone Number: 06/16/2023, 9:54 AM  Clinical Narrative:    Addendum: Provided son with SNFs that accepted and ratings. [facility] accepted and Son and pt agreeable to accept to go there, notified SNF. TOC will start Authorization close to discharge.  Son called back and requested more SNF choices within the [place], [place], or [place] area to choose from. Referrals sent and pending.   Expected Discharge Plan: Home/Self Care Barriers to Discharge: Continued Medical Work up  Expected Discharge Plan and Services       Living arrangements for the past 2 months: Single Family Home                                       Social Determinants of Health (SDOH) Interventions SDOH Screenings   Food Insecurity: No Food Insecurity (06/11/2023)  Housing: Low Risk  (06/11/2023)  Transportation Needs: No Transportation Needs (06/11/2023)  Utilities: Not At Risk (06/11/2023)  Financial Resource Strain: Low Risk  (07/28/2018)  Physical Activity: Inactive (07/28/2018)  Social Connections: Somewhat Isolated (07/28/2018)  Stress: No Stress Concern Present (07/28/2018)  Tobacco Use: Medium Risk (06/11/2023)    Readmission Risk Interventions     No data to display

## 2023-06-16 NOTE — Progress Notes (Signed)
ANTICOAGULATION CONSULT NOTE -  Pharmacy Consult for Warfarin and Heparin Indication: atrial fibrillation and atrial valve replacement  Allergies  Allergen Reactions   Penicillin G Itching and Other (See Comments)   Penicillins Hives, Itching, Swelling and Rash    Patient Measurements: Height: 5\' 8"  (172.7 cm) Weight: 74 kg (163 lb 2.3 oz) IBW/kg (Calculated) : 63.9 Heparin Dosing Weight:   Vital Signs: Temp: 98 F (36.7 C) (08/27 0507) BP: 124/76 (08/27 0507) Pulse Rate: 78 (08/27 0507)  Labs: Recent Labs    06/14/23 0449 06/15/23 0605 06/16/23 0410  HGB 9.5*  --  9.6*  HCT 30.7*  --  30.3*  PLT 136*  --  169  LABPROT 21.1* 23.6* 25.4*  INR 1.8* 2.1* 2.3*  CREATININE  --   --  1.11*    Estimated Creatinine Clearance: 46.9 mL/min (A) (by C-G formula based on SCr of 1.11 mg/dL (H)).   Medical History: Past Medical History:  Diagnosis Date   Anxiety disorder    Aortic stenosis    Status post St. Jude mechanical AVR 2007   Asthma    Atrial fibrillation (HCC)    Carcinoid tumor of colon 2007   Chronic diastolic heart failure (HCC)    Coronary atherosclerosis of native coronary artery    Status post CABG 2007   GERD (gastroesophageal reflux disease)    History of colonoscopy 2003   Dr. 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 Jena Gauss, benign biopsies   RLS (restless legs syndrome)    Schatzki's ring     Medications:  Medications Prior to Admission  Medication Sig Dispense Refill Last Dose   ALPRAZolam (XANAX) 0.5 MG tablet Take 0.5 mg by mouth 2 (two) times daily.   06/11/2023   bisoprolol (ZEBETA) 5 MG tablet Take 5 mg by mouth daily.   06/11/2023   CAPLYTA 42 MG capsule Take 42 mg by mouth daily.   06/11/2023   diltiazem (CARDIZEM CD) 120 MG 24 hr capsule Take 1 capsule (120 mg total) by mouth daily. 30 capsule 2 06/11/2023   FARXIGA 10 MG TABS tablet Take 10 mg by mouth  daily.   06/11/2023   HYDROcodone-acetaminophen (NORCO/VICODIN) 5-325 MG tablet Take 1 tablet by mouth 2 (two) times daily as needed for moderate pain or severe pain.   Past Week   ipratropium (ATROVENT) 0.06 % nasal spray Place 2 sprays into both nostrils in the morning and at bedtime.   06/11/2023   levothyroxine (SYNTHROID, LEVOTHROID) 50 MCG tablet Take 50 mcg by mouth daily before breakfast.    06/11/2023   Multiple Vitamin (MULTIVITAMIN WITH MINERALS) TABS tablet Take 1 tablet by mouth daily.   06/11/2023   omeprazole (PRILOSEC) 20 MG capsule Take 20 mg by mouth daily.   06/11/2023   OXYGEN Inhale 2 L into the lungs daily.    06/11/2023   rOPINIRole (REQUIP) 3 MG tablet Take 3 mg by mouth 2 (two) times daily.   06/11/2023   rosuvastatin (CRESTOR) 10 MG tablet Take 10 mg by mouth daily.   06/11/2023   torsemide (DEMADEX) 100 MG tablet Take 100 mg by mouth daily.   06/11/2023   warfarin (COUMADIN) 2 MG tablet Take 1.5-2 tablets (3-4 mg total) by mouth See admin instructions. 4mg  on Tuesday and 3 mg daily the rest of the week.   06/11/2023   bisoprolol (ZEBETA) 10 MG tablet Take  10 mg by mouth daily.      rosuvastatin (CRESTOR) 20 MG tablet TAKE ONE (1) TABLET BY MOUTH EVERY DAY 90 tablet 3     Assessment: Pharmacy consulted to dose warfarin and heparin in patient with atrial fibrillation and mechanical AVR. Patient's warfarin home dose listed as 1 mg on Fridays and 2 mg ROW. Planned surgery on 8/29.  INR 2.0 > 1.9> 1.8> 2.1> 2.3 - therapeutic  Goal of Therapy:  INR 2-3 Monitor platelets by anticoagulation protocol: Yes   Plan:  Hold warfarin Start heparin when INR < 2 Daily INR  Tory Emerald PharmD Candidate 06/16/2023 7:55 AM

## 2023-06-16 NOTE — Progress Notes (Signed)
Sat in chair for three hours today. Receiving scheduled tylenol and has not c/o pain.  Had bm.  Right leg has good pulse, warmth and movement.

## 2023-06-16 NOTE — Progress Notes (Signed)
PROGRESS NOTE   [name]  ZOX:[id number]    DOB: 08/15/1951    DOA: 06/11/2023  PCP: Rebekah Chesterfield, NP   I have briefly reviewed patients previous medical records in [facility].  Chief Complaint  Patient presents with   Fall    Brief Narrative:  72 year old female with PMH of chronic diastolic CHF, chronic respiratory failure on home oxygen 2 L/min, atrial fibrillation and mechanical AVR on warfarin anticoagulation, COPD, presented to the ED on 8/22 following a fall with left-sided chest and right knee pain.  Admitted for nondisplaced left anterior 4-7 rib fractures, comminuted displaced fracture involving the right lateral tibial plateau.  Orthopedics consulted and plan ORIF on 06/18/2023.  Cardiology consulted and provided preop clearance.   Assessment & Plan:  Principal Problem:   Right tibial fracture Active Problems:   Rib fractures   Hypokalemia   Hypothyroidism   Atrial fibrillation, chronic (HCC)   H/O heart valve replacement with mechanical valve   Chronic anticoagulation   Coronary artery disease of bypass graft of native heart with stable angina pectoris (HCC)   CKD (chronic kidney disease) stage 3, GFR 30-59 ml/min (HCC)   CHF (congestive heart failure) (HCC)   Benign essential HTN   Right lateral tibial plateau fracture Orthopedic consultation appreciated.   Initial plans were to consider elective surgery admission as an outpatient. Now ORIF planned for 06/18/2023.   In preparation for surgery, Coumadin was discontinued and to be bridged with IV heparin for underlying history of A-fib and mechanical AVR.  Postop IV heparin bridging and Warfarin to be resumed as soon as able and continued until INR therapeutic between 2 and 3.   Cardiology consulted, input appreciated, patient at increased risk but not prohibitive risk for surgery and recommended proceeding with surgery with no further cardiac testing or intervention. Agree that patient overall is at  increased risk from very advanced age, O2 dependent chronic respiratory failure, extensive cardiac history as noted but appears optimized as best as possible from those aspects and may proceed with indicated surgery to minimize further morbidity, with close perioperative monitoring, maintaining in and out even. Nonweightbearing right lower extremity in knee immobilizer. PT has seen and obviously recommend SNF. Patient waiting for surgery at this time.  Reports nausea, probably from Robaxin, discontinued.  Acute blood loss anemia May be related to the tibial fracture Hemoglobin dropped from 12.1 on 8/22-9.4.  Hemoglobin stable in the mid 9 g range since 8/24. Follow CBC postop and transfuse if hemoglobin 7 g or less  Thrombocytopenia Again could be due to acute blood loss. Resolved  Nondisplaced left anterior 4-7 rib fractures Has not complained of chest discomfort over the last 48 hours. Optimize nonopioid pain regimen, incentive spirometry encouraged. Has not complained of chest pain over the last 2 days.  Hypothyroidism -Continue Synthroid.   Hypokalemia Replaced.  Magnesium 2.2.   Benign essential HTN -Blood pressure is labile between soft and normal -Monitor closely. -Holding antihypertensive agents at the moment.   Chronic diastolic CHF (congestive heart failure) (HCC) -Stable and compensated despite holding of diuretics.  Last echo 08/2022 EF of 55 to 60%. -Holding torsemide and beta-blocker in the setting of soft blood pressure -Continue to follow daily weights and strict I's and O's.   CKD (chronic kidney disease) stage 3b, GFR 30-59 ml/min (HCC) -Creatinine stable in the 1.4-1.5 range -Monitor closely.  Avoid hypotension or nephrotoxic's. -Creatinine has actually improved to 1.11.   Chronic anticoagulation for permanent A-fib and  mechanical AVR As per cardiology office note on 03/30/2023, history of aortic stenosis, s/p St Jude mechanical AVR in 2007 along with  graft repair of AAA. On warfarin for atrial fibrillation. -Continue holding bisoprolol and diltiazem in the setting of soft blood pressure. 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  GERD Continue PPI  Hyperlipidemia Patient/family unsure of dose of Crestor. Resumed lower-dose Crestor 10 Mg daily.  Body mass index is 24.81 kg/m.    ACP Documents: None DVT prophylaxis:   On Coumadin anticoagulation   Code Status: Full Code:  Family Communication: Son at bedside today. Disposition:  Status is: Inpatient Remains inpatient appropriate because: Awaiting surgery 8/29.  May be ready for DC to SNF early next week     Consultants:   Orthopedics Cardiology  Procedures:     Antimicrobials:      Subjective:  Patient states that after taking 3 blue pills (this appears to be her home Requip) and a pink pill (?  Robaxin), she has been having nausea for the last couple days.  No abdominal pain.  No vomiting.  Objective:   Vitals:   06/15/23 1447 06/15/23 2119 06/16/23 0507 06/16/23 1428  BP: 127/81 118/85 124/76 116/80  Pulse: 88 82 78 78  Resp: (!) 24 18 20    Temp: 98.5 F (36.9 C) 98 F (36.7 C) 98 F (36.7 C) 98.2 F (36.8 C)  TempSrc: Oral   Oral  SpO2: 96% 100% 99% 90%  Weight:   74 kg   Height:        General exam: Elderly female, small built and frail lying uncomfortably in bed, appearing nauseous. Respiratory system: Clear to auscultation. Respiratory effort normal.  Midline sternotomy scar Cardiovascular system: S1 & S2 heard, irregularly irregular.  No JVD.  Click of mechanical AVR.  No pedal edema.  Telemetry personally reviewed: A-fib with controlled  ventricular rate. Gastrointestinal system: Abdomen is nondistended, soft and nontender. No organomegaly or masses felt. Normal bowel sounds heard. Central nervous system: Alert and oriented. No focal neurological deficits. Extremities: Symmetric 5 x 5 power.  Right lower extremity in immobilizer.  Left ankle/foot with some chronic bruising changes.  Unchanged and stable. Skin: No rashes, lesions or ulcers Psychiatry: Judgement and insight appear normal. Mood & affect appropriate.     Data Reviewed:   I have personally reviewed following labs and imaging studies   CBC: Recent Labs  Lab 06/11/23 1547 06/12/23 0431 06/13/23 0456 06/14/23 0449 06/16/23 0410  WBC 11.1*   < > 6.9 5.2 4.4  NEUTROABS 8.6*  --   --   --   --   HGB 12.1   < > 9.4* 9.5* 9.6*  HCT 38.1   < > 30.0* 30.7* 30.3*  MCV 90.3   < > 92.6 91.4 89.9  PLT 212   < > 136* 136* 169   < > = values in this interval not displayed.    Basic Metabolic Panel: Recent Labs  Lab 06/11/23 1547 06/12/23 0431 06/16/23 0410  NA 139 136 130*  K 3.1* 3.7 4.2  CL 97* 100 97*  CO2 32 28 27  GLUCOSE 95 95 82  BUN 22 21 13   CREATININE 1.46* 1.54* 1.11*  CALCIUM 9.4 8.4* 8.5*  MG 2.2  --   --  Liver Function Tests: Recent Labs  Lab 06/11/23 1547  AST 33  ALT 20  ALKPHOS 69  BILITOT 0.8  PROT 7.8  ALBUMIN 4.2    CBG: No results for input(s): "GLUCAP" in the last 168 hours.  Microbiology Studies:  No results found for this or any previous visit (from the past 240 hour(s)).  Radiology Studies:  No results found.  Scheduled Meds:    acetaminophen  1,000 mg Oral QID   levothyroxine  50 mcg Oral Q0600   lumateperone tosylate  42 mg Oral Daily   methocarbamol  500 mg Oral TID   pantoprazole  40 mg Oral Daily   rOPINIRole  3 mg Oral BID   rosuvastatin  10 mg Oral Daily    Continuous Infusions:     LOS: 5 days     Marcellus Scott, MD,  FACP, FHM, SFHM, [facility], [facility]   [facility]  Physician Advisor [place]     To contact the attending provider between 7A-7P or the covering provider during after hours 7P-7A, please log into the web site [email or web address] and access using universal [place] password for that web site. If you do not have the password, please call the hospital operator.  06/16/2023, 3:14 PM

## 2023-06-16 NOTE — Plan of Care (Signed)

## 2023-06-17 DIAGNOSIS — S82141A Displaced bicondylar fracture of right tibia, initial encounter for closed fracture: Secondary | ICD-10-CM

## 2023-06-17 DIAGNOSIS — S2242XA Multiple fractures of ribs, left side, initial encounter for closed fracture: Secondary | ICD-10-CM | POA: Diagnosis not present

## 2023-06-17 DIAGNOSIS — E876 Hypokalemia: Secondary | ICD-10-CM | POA: Diagnosis not present

## 2023-06-17 DIAGNOSIS — E039 Hypothyroidism, unspecified: Secondary | ICD-10-CM | POA: Diagnosis not present

## 2023-06-17 LAB — BASIC METABOLIC PANEL
Anion gap: 8 (ref 5–15)
BUN: 14 mg/dL (ref 8–23)
CO2: 24 mmol/L (ref 22–32)
Calcium: 8.4 mg/dL — ABNORMAL LOW (ref 8.9–10.3)
Chloride: 96 mmol/L — ABNORMAL LOW (ref 98–111)
Creatinine, Ser: 1.06 mg/dL — ABNORMAL HIGH (ref 0.44–1.00)
GFR, Estimated: 56 mL/min — ABNORMAL LOW (ref >60)
Glucose, Bld: 85 mg/dL (ref 70–99)
Potassium: 4.1 mmol/L (ref 3.5–5.1)
Sodium: 128 mmol/L — ABNORMAL LOW (ref 135–145)

## 2023-06-17 LAB — CBC
HCT: 30.8 % — ABNORMAL LOW (ref 36.0–46.0)
Hemoglobin: 10 g/dL — ABNORMAL LOW (ref 12.0–15.0)
MCH: 29 pg (ref 26.0–34.0)
MCHC: 32.5 g/dL (ref 30.0–36.0)
MCV: 89.3 fL (ref 80.0–100.0)
Platelets: 183 10*3/uL (ref 150–400)
RBC: 3.45 MIL/uL — ABNORMAL LOW (ref 3.87–5.11)
RDW: 14 % (ref 11.5–15.5)
WBC: 5.5 10*3/uL (ref 4.0–10.5)
nRBC: 0 % (ref 0.0–0.2)

## 2023-06-17 LAB — HEPARIN LEVEL (UNFRACTIONATED): Heparin Unfractionated: 0.31 [IU]/mL (ref 0.30–0.70)

## 2023-06-17 LAB — PROTIME-INR
INR: 1.9 — ABNORMAL HIGH (ref 0.8–1.2)
Prothrombin Time: 22 s — ABNORMAL HIGH (ref 11.4–15.2)

## 2023-06-17 MED ORDER — HEPARIN (PORCINE) 25000 UT/250ML-% IV SOLN
1050.0000 [IU]/h | INTRAVENOUS | Status: DC
  Administered 2023-06-17 – 2023-06-18 (×2): 1000 [IU]/h via INTRAVENOUS
  Administered 2023-06-19: 1100 [IU]/h via INTRAVENOUS
  Administered 2023-06-20 – 2023-06-21 (×2): 1050 [IU]/h via INTRAVENOUS
  Filled 2023-06-17 (×4): qty 250

## 2023-06-17 NOTE — Progress Notes (Addendum)
ANTICOAGULATION CONSULT NOTE -  Pharmacy Consult for Heparin Indication: atrial fibrillation and aortic valve replacement  Allergies  Allergen Reactions   Penicillin G Itching and Other (See Comments)   Penicillins Hives, Itching, Swelling and Rash    Patient Measurements: Height: 5\' 8"  (172.7 cm) Weight: 72.2 kg (159 lb 2.8 oz) IBW/kg (Calculated) : 63.9 Heparin Dosing Weight: 72.2 kg  Vital Signs: Temp: 98.7 F (37.1 C) (08/27 2127) Temp Source: Oral (08/28 0347) BP: 117/81 (08/28 0347) Pulse Rate: 83 (08/28 0347)  Labs: Recent Labs    06/15/23 0605 06/16/23 0410 06/17/23 0421  HGB  --  9.6* 10.0*  HCT  --  30.3* 30.8*  PLT  --  169 183  LABPROT 23.6* 25.4* 22.0*  INR 2.1* 2.3* 1.9*  CREATININE  --  1.11*  --     Estimated Creatinine Clearance: 46.9 mL/min (A) (by C-G formula based on SCr of 1.11 mg/dL (H)).   Medical History: Past Medical History:  Diagnosis Date   Anxiety disorder    Aortic stenosis    Status post St. Jude mechanical AVR 2007   Asthma    Atrial fibrillation (HCC)    Carcinoid tumor of colon 2007   Chronic diastolic heart failure (HCC)    Coronary atherosclerosis of native coronary artery    Status post CABG 2007   GERD (gastroesophageal reflux disease)    History of colonoscopy 2003   Dr. 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 Jena Gauss, benign biopsies   RLS (restless legs syndrome)    Schatzki's ring     Medications:  Medications Prior to Admission  Medication Sig Dispense Refill Last Dose   ALPRAZolam (XANAX) 0.5 MG tablet Take 0.5 mg by mouth 2 (two) times daily.   06/11/2023   bisoprolol (ZEBETA) 5 MG tablet Take 5 mg by mouth daily.   06/11/2023   CAPLYTA 42 MG capsule Take 42 mg by mouth daily.   06/11/2023   diltiazem (CARDIZEM CD) 120 MG 24 hr capsule Take 1 capsule (120 mg total) by mouth daily. 30 capsule 2 06/11/2023   FARXIGA 10 MG TABS  tablet Take 10 mg by mouth daily.   06/11/2023   HYDROcodone-acetaminophen (NORCO/VICODIN) 5-325 MG tablet Take 1 tablet by mouth 2 (two) times daily as needed for moderate pain or severe pain.   Past Week   ipratropium (ATROVENT) 0.06 % nasal spray Place 2 sprays into both nostrils in the morning and at bedtime.   06/11/2023   levothyroxine (SYNTHROID, LEVOTHROID) 50 MCG tablet Take 50 mcg by mouth daily before breakfast.    06/11/2023   Multiple Vitamin (MULTIVITAMIN WITH MINERALS) TABS tablet Take 1 tablet by mouth daily.   06/11/2023   omeprazole (PRILOSEC) 20 MG capsule Take 20 mg by mouth daily.   06/11/2023   OXYGEN Inhale 2 L into the lungs daily.    06/11/2023   rOPINIRole (REQUIP) 3 MG tablet Take 3 mg by mouth 2 (two) times daily.   06/11/2023   rosuvastatin (CRESTOR) 10 MG tablet Take 10 mg by mouth daily.   06/11/2023   torsemide (DEMADEX) 100 MG tablet Take 100 mg by mouth daily.   06/11/2023   warfarin (COUMADIN) 2 MG tablet Take 1.5-2 tablets (3-4 mg total) by mouth See admin instructions. 4mg  on Tuesday and 3 mg daily the rest of the week.   06/11/2023   bisoprolol (ZEBETA)  10 MG tablet Take 10 mg by mouth daily.      rosuvastatin (CRESTOR) 20 MG tablet TAKE ONE (1) TABLET BY MOUTH EVERY DAY 90 tablet 3     Assessment: Pharmacy consulted to dose heparin in patient with atrial fibrillation and mechanical AVR. Patient's warfarin home dose listed as 1 mg on Fridays and 2 mg ROW. Has held warfarin since 8/26 for planned surgery on 8/29.  INR 2.1> 2.3 > 1.9 - subtherapeutic. Platelets WNL. Hemoglobin stable around 10. Will start IV heparin.   Goal of Therapy:  INR 2-3 Monitor platelets by anticoagulation protocol: Yes   Plan:  Hold warfarin. Start heparin infusion at 1000 units/hr. Check heparin level in 8 hours. Check INR daily.  Tory Emerald, PharmD Candidate 06/17/2023 8:09 AM

## 2023-06-17 NOTE — Progress Notes (Signed)
ANTICOAGULATION CONSULT NOTE -  Pharmacy Consult for Heparin Indication: atrial fibrillation and aortic valve replacement  Allergies  Allergen Reactions   Penicillin G Itching and Other (See Comments)   Penicillins Hives, Itching, Swelling and Rash    Patient Measurements: Height: 5\' 8"  (172.7 cm) Weight: 72.2 kg (159 lb 2.8 oz) IBW/kg (Calculated) : 63.9 Heparin Dosing Weight: 72.2 kg  Vital Signs: Temp: 99.3 F (37.4 C) (08/28 1403) BP: 128/74 (08/28 1403) Pulse Rate: 86 (08/28 1403)  Labs: Recent Labs    06/15/23 0605 06/16/23 0410 06/17/23 0421 06/17/23 1850  HGB  --  9.6* 10.0*  --   HCT  --  30.3* 30.8*  --   PLT  --  169 183  --   LABPROT 23.6* 25.4* 22.0*  --   INR 2.1* 2.3* 1.9*  --   HEPARINUNFRC  --   --   --  0.31  CREATININE  --  1.11* 1.06*  --     Estimated Creatinine Clearance: 49.1 mL/min (A) (by C-G formula based on SCr of 1.06 mg/dL (H)).   Medical History: Past Medical History:  Diagnosis Date   Anxiety disorder    Aortic stenosis    Status post St. Jude mechanical AVR 2007   Asthma    Atrial fibrillation (HCC)    Carcinoid tumor of colon 2007   Chronic diastolic heart failure (HCC)    Coronary atherosclerosis of native coronary artery    Status post CABG 2007   GERD (gastroesophageal reflux disease)    History of colonoscopy 2003   Dr. 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 Jena Gauss, benign biopsies   RLS (restless legs syndrome)    Schatzki's ring     Medications:  Medications Prior to Admission  Medication Sig Dispense Refill Last Dose   ALPRAZolam (XANAX) 0.5 MG tablet Take 0.5 mg by mouth 2 (two) times daily.   06/11/2023   bisoprolol (ZEBETA) 5 MG tablet Take 5 mg by mouth daily.   06/11/2023   CAPLYTA 42 MG capsule Take 42 mg by mouth daily.   06/11/2023   diltiazem (CARDIZEM CD) 120 MG 24 hr capsule Take 1 capsule (120 mg total) by mouth  daily. 30 capsule 2 06/11/2023   FARXIGA 10 MG TABS tablet Take 10 mg by mouth daily.   06/11/2023   HYDROcodone-acetaminophen (NORCO/VICODIN) 5-325 MG tablet Take 1 tablet by mouth 2 (two) times daily as needed for moderate pain or severe pain.   Past Week   ipratropium (ATROVENT) 0.06 % nasal spray Place 2 sprays into both nostrils in the morning and at bedtime.   06/11/2023   levothyroxine (SYNTHROID, LEVOTHROID) 50 MCG tablet Take 50 mcg by mouth daily before breakfast.    06/11/2023   Multiple Vitamin (MULTIVITAMIN WITH MINERALS) TABS tablet Take 1 tablet by mouth daily.   06/11/2023   omeprazole (PRILOSEC) 20 MG capsule Take 20 mg by mouth daily.   06/11/2023   OXYGEN Inhale 2 L into the lungs daily.    06/11/2023   rOPINIRole (REQUIP) 3 MG tablet Take 3 mg by mouth 2 (two) times daily.   06/11/2023   rosuvastatin (CRESTOR) 10 MG tablet Take 10 mg by mouth daily.   06/11/2023   torsemide (DEMADEX) 100 MG tablet Take 100 mg by mouth daily.   06/11/2023   warfarin (COUMADIN) 2 MG tablet Take 1.5-2 tablets (3-4 mg  total) by mouth See admin instructions. 4mg  on Tuesday and 3 mg daily the rest of the week.   06/11/2023   bisoprolol (ZEBETA) 10 MG tablet Take 10 mg by mouth daily.      rosuvastatin (CRESTOR) 20 MG tablet TAKE ONE (1) TABLET BY MOUTH EVERY DAY 90 tablet 3     Assessment: Pharmacy consulted to dose heparin in patient with atrial fibrillation and mechanical AVR. Patient's warfarin home dose listed as 1 mg on Fridays and 2 mg ROW. Has held warfarin since 8/26 for planned surgery on 8/29.  INR 2.1> 2.3 > 1.9 - subtherapeutic. Platelets WNL. Hemoglobin stable around 10. Will start IV heparin.   Heparin level 0.31 which is therapeutic.   Goal of Therapy:  INR 2-3 Monitor platelets by anticoagulation protocol: Yes   Plan:   Continue heparin infusion at 1000 units/hr. Check heparin level with am labs. Check INR daily.  Jeanella Cara, PharmD, [facility] Clinical Pharmacist Please see AMION  for all Pharmacists' Contact Phone Numbers 06/17/2023, 7:18 PM

## 2023-06-17 NOTE — Progress Notes (Signed)
[facility]  PROGRESS NOTE  [name] ZHY:[id number] DOB: 01/19/1951 DOA: 06/11/2023 PCP: Rebekah Chesterfield, NP   Brief HPI:   72 year old female with PMH of chronic diastolic CHF, chronic respiratory failure on home oxygen 2 L/min, atrial fibrillation and mechanical AVR on warfarin anticoagulation, COPD, presented to the ED on 8/22 following a fall with left-sided chest and right knee pain. Admitted for nondisplaced left anterior 4-7 rib fractures, comminuted displaced fracture involving the right lateral tibial plateau. Orthopedics consulted and plan ORIF on 06/18/2023. Cardiology consulted and provided preop clearance.     Assessment/Plan:   Right lateral tibial plateau fracture Orthopedic consultation appreciated.   Initial plans were to consider elective surgery admission as an outpatient. Now ORIF planned for 06/18/2023.   In preparation for surgery, Coumadin was discontinued and to be bridged with IV heparin for underlying history of A-fib and mechanical AVR.  Postop IV heparin bridging and Warfarin to be resumed as soon as able and continued until INR therapeutic between 2 and 3.   Cardiology consulted, input appreciated, patient at increased risk but not prohibitive risk for surgery and recommended proceeding with surgery with no further cardiac testing or intervention. Agree that patient overall is at increased risk from very advanced age, O2 dependent chronic respiratory failure, extensive cardiac history as noted but appears optimized as best as possible from those aspects and may proceed with indicated surgery to minimize further morbidity, with close perioperative monitoring, maintaining in and out even. Nonweightbearing right lower extremity in knee immobilizer. PT has seen and obviously recommend SNF. Patient waiting for surgery in am      Acute blood loss anemia May be related to the tibial fracture Hemoglobin dropped from 12.1 on 8/22-9.4.   -Hemoglobin  has  improved to 10.0 this morning Follow CBC postop and transfuse if hemoglobin 7 g or less   Thrombocytopenia Again could be due to acute blood loss. Resolved   Nondisplaced left anterior 4-7 rib fractures/left chest wall pain Optimize nonopioid pain regimen, incentive spirometry encouraged.    Hypothyroidism -Continue Synthroid.   Hypokalemia Replaced.   Magnesium 2.2.   Benign essential HTN -Blood pressure is labile between soft and normal -Monitor closely. -Holding antihypertensive agents at the moment.   Chronic diastolic CHF (congestive heart failure) (HCC) -Stable and compensated despite holding of diuretics.  Last echo 08/2022 EF of 55 to 60%. -Holding torsemide and beta-blocker in the setting of soft blood pressure -Continue to follow daily weights and strict I's and O's.   CKD (chronic kidney disease) stage 3b, GFR 30-59 ml/min (HCC) -Creatinine stable in the 1.4-1.5 range -Monitor closely.  Avoid hypotension or nephrotoxic's. -Creatinine has actually improved to 1.11.   Chronic anticoagulation for permanent A-fib and mechanical AVR As per cardiology office note on 03/30/2023, history of aortic stenosis, s/p St Jude mechanical AVR in 2007 along with graft repair of AAA. On warfarin for atrial fibrillation. -Continue holding bisoprolol and diltiazem in the setting of soft blood pressure. 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   GERD Continue PPI   Hyperlipidemia Patient/family unsure of dose of Crestor. Resumed lower-dose Crestor 10 Mg daily.    Medications     acetaminophen  1,000 mg Oral QID   levothyroxine  50 mcg Oral  [id number]   lumateperone tosylate   42 mg Oral Daily   pantoprazole  40 mg Oral Daily   rOPINIRole  3 mg Oral BID   rosuvastatin  10 mg Oral Daily     Data Reviewed:   CBG:  No results for input(s): "GLUCAP" in the last 168 hours.  SpO2: 97 % O2 Flow Rate (L/min): 2 L/min    Vitals:   06/16/23 0507 06/16/23 1428 06/16/23 2127 06/17/23 0347  BP: 124/76 116/80 114/83 117/81  Pulse: 78 78 84 83  Resp: 20     Temp: 98 F (36.7 C) 98.2 F (36.8 C) 98.7 F (37.1 C)   TempSrc:  Oral Oral Oral  SpO2: 99% 90% 97% 97%  Weight: 74 kg   72.2 kg  Height:          Data Reviewed:  Basic Metabolic Panel: Recent Labs  Lab 06/11/23 1547 06/12/23 0431 06/16/23 0410  NA 139 136 130*  K 3.1* 3.7 4.2  CL 97* 100 97*  CO2 32 28 27  GLUCOSE 95 95 82  BUN 22 21 13   CREATININE 1.46* 1.54* 1.11*  CALCIUM 9.4 8.4* 8.5*  MG 2.2  --   --     CBC: Recent Labs  Lab 06/11/23 1547 06/12/23 0431 06/13/23 0456 06/14/23 0449 06/16/23 0410 06/17/23 0421  WBC 11.1* 7.7 6.9 5.2 4.4 5.5  NEUTROABS 8.6*  --   --   --   --   --   HGB 12.1 10.2* 9.4* 9.5* 9.6* 10.0*  HCT 38.1 32.1* 30.0* 30.7* 30.3* 30.8*  MCV 90.3 90.2 92.6 91.4 89.9 89.3  PLT 212 168 136* 136* 169 183    LFT Recent Labs  Lab 06/11/23 1547  AST 33  ALT 20  ALKPHOS 69  BILITOT 0.8  PROT 7.8  ALBUMIN 4.2     Antibiotics: Anti-infectives (From admission, onward)    Start     Dose/Rate Route Frequency Ordered Stop   06/18/23 0600  ceFAZolin (ANCEF) IVPB 2g/100 mL premix        2 g 200 mL/hr over 30 Minutes Intravenous On call to O.R. 06/16/23 2153 06/19/23 0559        DVT prophylaxis: On IV heparin  Code Status: Full code  Family Communication:    CONSULTS Orthopedics   Subjective   Complains of chest discomfort over left anterior chest wall   Objective    Physical Examination:   General: Appears in no acute distress Cardiovascular: S1-S2, regular, no murmur auscultated Respiratory: Lungs clear to auscultation  bilaterally Abdomen: Soft, nontender, no organomegaly Extremities: No edema in the lower extremities Neurologic: Alert, oriented x 3, no focal deficit noted   Status is: Inpatient:             Meredeth Ide   [facility] If 7PM-7AM, please contact night-coverage at [email or web address], Office  [phone]   06/17/2023, 8:31 AM  LOS: 6 days

## 2023-06-17 NOTE — Plan of Care (Signed)
  Problem: Acute Rehab OT Goals (only OT should resolve) Goal: Pt. Will Perform Grooming Flowsheets (Taken 06/17/2023 1026) Pt Will Perform Grooming:  with modified independence  sitting Goal: Pt. Will Perform Lower Body Bathing Flowsheets (Taken 06/17/2023 1026) Pt Will Perform Lower Body Bathing:  with min assist  sitting/lateral leans Goal: Pt. Will Perform Upper Body Dressing Flowsheets (Taken 06/17/2023 1026) Pt Will Perform Upper Body Dressing:  with modified independence  sitting Goal: Pt. Will Perform Lower Body Dressing Flowsheets (Taken 06/17/2023 1026) Pt Will Perform Lower Body Dressing:  with min assist  sitting/lateral leans Goal: Pt. Will Transfer To Toilet Flowsheets (Taken 06/17/2023 1026) Pt Will Transfer to Toilet:  with contact guard assist  stand pivot transfer Goal: Pt. Will Perform Toileting-Clothing Manipulation Flowsheets (Taken 06/17/2023 1026) Pt Will Perform Toileting - Clothing Manipulation and hygiene:  with modified independence  sitting/lateral leans Goal: Pt/Caregiver Will Perform Home Exercise Program Flowsheets (Taken 06/17/2023 1026) Pt/caregiver will Perform Home Exercise Program:  Increased ROM  Increased strength  Both right and left upper extremity  Independently  Senai Ramnath OT, MOT

## 2023-06-17 NOTE — Evaluation (Signed)
Occupational Therapy Evaluation Patient Details Name: [name] MRN: [id number] DOB: 09-Nov-1950 Today's Date: 06/17/2023   History of Present Illness [name] is a 72 y.o. female with medical history significant for diastolic CHF, chronic respiratory failure on 2 L, atrial fibrillation, COPD.  Patient presented to the ED with complaints of a fall with left-sided chest and knee pain.  Patient got up onto a counter to clean when she lost her balance and fell in the bathroom, hitting the left side of her chest and her right leg following into the commode.  It took about 30 minutes to crawl to her phone to call her son.  She denies any chest pain, no dizziness, no change in breathing.  He is on warfarin and took her last dose this morning.  She is also on diuretics-torsemide   Clinical Impression   Pt agreeable to OT evaluation. Pt reported being independent at baseline for ADL's and mobility. Today pt required mod to max A for sit to stand with cuing to maintain weight bearing precaution and grasp RW. Pt required min to mod A for bed mobility and was unable to complete any lower body ADL tasks. Pt also demonstrates mild A/ROM limitation in L UE likely due to L flank pain. Pt left in the bed with the call bell within reach. Pt will benefit from continued OT in the hospital and recommended venue below to increase strength, balance, and endurance for safe ADL's.          If plan is discharge home, recommend the following: A lot of help with walking and/or transfers;A lot of help with bathing/dressing/bathroom;Assistance with cooking/housework;Help with stairs or ramp for entrance;Assist for transportation    Functional Status Assessment  Patient has had a recent decline in their functional status and demonstrates the ability to make significant improvements in function in a reasonable and predictable amount of time.  Equipment Recommendations  None recommended by OT            Precautions / Restrictions Precautions Precautions: Fall Required Braces or Orthoses: Knee Immobilizer - Right Knee Immobilizer - Right: On when out of bed or walking Restrictions Weight Bearing Restrictions: Yes RLE Weight Bearing: Non weight bearing      Mobility Bed Mobility Overal bed mobility: Needs Assistance Bed Mobility: Supine to Sit     Supine to sit: Min assist, Mod assist Sit to supine: Min assist   General bed mobility comments: Assist to move R LE and pull to sit; assist to move R LE into bed.    Transfers Overall transfer level: Needs assistance Equipment used: Rolling walker (2 wheels) Transfers: Sit to/from Stand Sit to Stand: Mod assist, Max assist           General transfer comment: cuing to maintain weight bearing and grasp RW; sit to stand only; unsteady in standing.      Balance Overall balance assessment: Needs assistance Sitting-balance support: No upper extremity supported, Feet supported Sitting balance-Leahy Scale: Good Sitting balance - Comments: seated at EOB   Standing balance support: Bilateral upper extremity supported, During functional activity, Reliant on assistive device for balance Standing balance-Leahy Scale: Poor Standing balance comment: using RW with cuing                           ADL either performed or assessed with clinical judgement   ADL Overall ADL's : Needs assistance/impaired     Grooming: Set up;Minimal assistance;Sitting  Upper Body Bathing: Set up;Minimal assistance;Sitting   Lower Body Bathing: Maximal assistance;Total assistance;Sitting/lateral leans   Upper Body Dressing : Set up;Minimal assistance;Sitting   Lower Body Dressing: Maximal assistance;Total assistance;Sitting/lateral leans Lower Body Dressing Details (indicate cue type and reason): Pt reported inability to don socks seated at EOB. Toilet Transfer: Moderate assistance;Maximal assistance;Stand-pivot;Rolling walker (2  wheels) Toilet Transfer Details (indicate cue type and reason): Paritially simualted via sit to stand from EOB Toileting- Architect and Hygiene: Moderate assistance;Sitting/lateral lean;Maximal assistance               Vision Baseline Vision/History: 1 Wears glasses Ability to See in Adequate Light: 0 Adequate Patient Visual Report: No change from baseline Vision Assessment?: No apparent visual deficits     Perception Perception: Not tested       Praxis Praxis: Not tested       Pertinent Vitals/Pain Pain Assessment Pain Assessment: Faces Faces Pain Scale: Hurts even more Pain Location: L flank Pain Descriptors / Indicators: Grimacing, Guarding, Other (Comment) (pulling) Pain Intervention(s): Limited activity within patient's tolerance, Monitored during session, Repositioned     Extremity/Trunk Assessment Upper Extremity Assessment Upper Extremity Assessment: Generalized weakness (L UE A/ROM limited seemingly due to L flank pain.)   Lower Extremity Assessment Lower Extremity Assessment: Defer to PT evaluation   Cervical / Trunk Assessment Cervical / Trunk Assessment: Normal   Communication Communication Communication: No apparent difficulties   Cognition Arousal: Alert Behavior During Therapy: WFL for tasks assessed/performed Overall Cognitive Status: Within Functional Limits for tasks assessed                                                        Home Living Family/patient expects to be discharged to:: Private residence Living Arrangements: Children Available Help at Discharge: Family;Personal care attendant Type of Home: Mobile home Home Access: Stairs to enter Secretary/administrator of Steps: 2 Entrance Stairs-Rails: Can reach both (R rail is rotted.) Home Layout: One level     Bathroom Shower/Tub: Chief Strategy Officer: Handicapped height Bathroom Accessibility: No (not the pt's bathroom, but son's  is.)   Home Equipment: Agricultural consultant (2 wheels);BSC/3in1;Wheelchair - manual          Prior Functioning/Environment Prior Level of Function : Independent/Modified Independent             Mobility Comments: Tourist information centre manager without AD ADLs Comments: Independent ADL; son gets groceries.        OT Problem List: Decreased strength;Decreased range of motion;Decreased activity tolerance;Impaired balance (sitting and/or standing);Pain      OT Treatment/Interventions: Self-care/ADL training;Therapeutic exercise;DME and/or AE instruction;Therapeutic activities;Patient/family education;Balance training    OT Goals(Current goals can be found in the care plan section) Acute Rehab OT Goals Patient Stated Goal: improve function OT Goal Formulation: With patient Time For Goal Achievement: 07/01/23 Potential to Achieve Goals: Good  OT Frequency: Min 2X/week                                   End of Session Equipment Utilized During Treatment: Rolling walker (2 wheels);Oxygen  Activity Tolerance: Patient tolerated treatment well Patient left: in bed;with call bell/phone within reach  OT Visit Diagnosis: Unsteadiness on feet (R26.81);Other abnormalities of gait and mobility (R26.89);Muscle weakness (generalized) (  M62.81)                Time: 4098-1191 OT Time Calculation (min): 20 min Charges:  OT General Charges $OT Visit: 1 Visit OT Evaluation $OT Eval Low Complexity: 1 Low  [name] OT, MOT  Danie Chandler 06/17/2023, 10:23 AM

## 2023-06-18 ENCOUNTER — Inpatient Hospital Stay (HOSPITAL_COMMUNITY): Payer: Medicare Other | Admitting: Certified Registered"

## 2023-06-18 ENCOUNTER — Other Ambulatory Visit: Payer: Self-pay

## 2023-06-18 ENCOUNTER — Encounter (HOSPITAL_COMMUNITY): Payer: Self-pay | Admitting: Internal Medicine

## 2023-06-18 ENCOUNTER — Inpatient Hospital Stay (HOSPITAL_COMMUNITY): Payer: Medicare Other

## 2023-06-18 ENCOUNTER — Encounter (HOSPITAL_COMMUNITY)
Admission: EM | Disposition: A | Discharge status: Skilled Nursing Facility | Payer: Self-pay | Source: Home / Self Care | Attending: Family Medicine

## 2023-06-18 DIAGNOSIS — S2242XA Multiple fractures of ribs, left side, initial encounter for closed fracture: Secondary | ICD-10-CM | POA: Diagnosis not present

## 2023-06-18 DIAGNOSIS — S82121A Displaced fracture of lateral condyle of right tibia, initial encounter for closed fracture: Secondary | ICD-10-CM

## 2023-06-18 DIAGNOSIS — S82141A Displaced bicondylar fracture of right tibia, initial encounter for closed fracture: Secondary | ICD-10-CM | POA: Diagnosis not present

## 2023-06-18 DIAGNOSIS — Z87891 Personal history of nicotine dependence: Secondary | ICD-10-CM

## 2023-06-18 DIAGNOSIS — E039 Hypothyroidism, unspecified: Secondary | ICD-10-CM | POA: Diagnosis not present

## 2023-06-18 DIAGNOSIS — E876 Hypokalemia: Secondary | ICD-10-CM | POA: Diagnosis not present

## 2023-06-18 DIAGNOSIS — S83281A Other tear of lateral meniscus, current injury, right knee, initial encounter: Secondary | ICD-10-CM

## 2023-06-18 DIAGNOSIS — I110 Hypertensive heart disease with heart failure: Secondary | ICD-10-CM

## 2023-06-18 DIAGNOSIS — I509 Heart failure, unspecified: Secondary | ICD-10-CM

## 2023-06-18 DIAGNOSIS — J449 Chronic obstructive pulmonary disease, unspecified: Secondary | ICD-10-CM | POA: Diagnosis not present

## 2023-06-18 HISTORY — PX: ORIF TIBIA PLATEAU: SHX2132

## 2023-06-18 LAB — CBC
HCT: 31.1 % — ABNORMAL LOW (ref 36.0–46.0)
Hemoglobin: 9.8 g/dL — ABNORMAL LOW (ref 12.0–15.0)
MCH: 28.4 pg (ref 26.0–34.0)
MCHC: 31.5 g/dL (ref 30.0–36.0)
MCV: 90.1 fL (ref 80.0–100.0)
Platelets: 179 10*3/uL (ref 150–400)
RBC: 3.45 MIL/uL — ABNORMAL LOW (ref 3.87–5.11)
RDW: 14.2 % (ref 11.5–15.5)
WBC: 4.5 10*3/uL (ref 4.0–10.5)
nRBC: 0 % (ref 0.0–0.2)

## 2023-06-18 LAB — HEPARIN LEVEL (UNFRACTIONATED): Heparin Unfractionated: 0.42 [IU]/mL (ref 0.30–0.70)

## 2023-06-18 LAB — PROTIME-INR
INR: 1.8 — ABNORMAL HIGH (ref 0.8–1.2)
Prothrombin Time: 21 s — ABNORMAL HIGH (ref 11.4–15.2)

## 2023-06-18 LAB — OSMOLALITY: Osmolality: 288 mOsm/kg (ref 275–295)

## 2023-06-18 SURGERY — OPEN REDUCTION INTERNAL FIXATION (ORIF) TIBIAL PLATEAU
Anesthesia: General | Site: Leg Lower | Laterality: Right

## 2023-06-18 MED ORDER — ROCURONIUM BROMIDE 10 MG/ML (PF) SYRINGE
PREFILLED_SYRINGE | INTRAVENOUS | Status: AC
  Filled 2023-06-18: qty 10

## 2023-06-18 MED ORDER — DEXMEDETOMIDINE HCL IN NACL 80 MCG/20ML IV SOLN
INTRAVENOUS | Status: DC | PRN
Start: 2023-06-18 — End: 2023-06-18
  Administered 2023-06-18 (×2): 8 ug via INTRAVENOUS

## 2023-06-18 MED ORDER — FENTANYL CITRATE (PF) 100 MCG/2ML IJ SOLN
INTRAMUSCULAR | Status: DC | PRN
Start: 2023-06-18 — End: 2023-06-18
  Administered 2023-06-18 (×6): 50 ug via INTRAVENOUS

## 2023-06-18 MED ORDER — ONDANSETRON HCL 4 MG/2ML IJ SOLN
INTRAMUSCULAR | Status: AC
  Filled 2023-06-18: qty 2

## 2023-06-18 MED ORDER — BUPIVACAINE-EPINEPHRINE 0.5% -1:200000 IJ SOLN
INTRAMUSCULAR | Status: DC | PRN
  Administered 2023-06-18: 30 mL

## 2023-06-18 MED ORDER — SUGAMMADEX SODIUM 200 MG/2ML IV SOLN
INTRAVENOUS | Status: DC | PRN
  Administered 2023-06-18: 200 mg via INTRAVENOUS

## 2023-06-18 MED ORDER — CEFAZOLIN SODIUM-DEXTROSE 2-4 GM/100ML-% IV SOLN
2.0000 g | Freq: Three times a day (TID) | INTRAVENOUS | Status: AC
  Administered 2023-06-18 – 2023-06-19 (×3): 2 g via INTRAVENOUS
  Filled 2023-06-18 (×3): qty 100

## 2023-06-18 MED ORDER — CHLORHEXIDINE GLUCONATE 0.12 % MT SOLN: 15.0000 mL | Freq: Once | OROMUCOSAL | Status: AC

## 2023-06-18 MED ORDER — FENTANYL CITRATE (PF) 100 MCG/2ML IJ SOLN
INTRAMUSCULAR | Status: AC
  Filled 2023-06-18: qty 2

## 2023-06-18 MED ORDER — ROCURONIUM BROMIDE 10 MG/ML (PF) SYRINGE
PREFILLED_SYRINGE | INTRAVENOUS | Status: DC | PRN
  Administered 2023-06-18: 40 mg via INTRAVENOUS

## 2023-06-18 MED ORDER — MIDAZOLAM HCL 2 MG/2ML IJ SOLN
INTRAMUSCULAR | Status: AC
  Filled 2023-06-18: qty 2

## 2023-06-18 MED ORDER — SEVOFLURANE IN SOLN
RESPIRATORY_TRACT | Status: AC
  Filled 2023-06-18: qty 250

## 2023-06-18 MED ORDER — CHLORHEXIDINE GLUCONATE 0.12 % MT SOLN
OROMUCOSAL | Status: AC
  Administered 2023-06-18: 15 mL via OROMUCOSAL
  Filled 2023-06-18: qty 15

## 2023-06-18 MED ORDER — OXYCODONE HCL 5 MG/5ML PO SOLN: 5.0000 mg | Freq: Once | ORAL | Status: DC | PRN

## 2023-06-18 MED ORDER — CEFAZOLIN SODIUM-DEXTROSE 2-4 GM/100ML-% IV SOLN
INTRAVENOUS | Status: AC
  Filled 2023-06-18: qty 100

## 2023-06-18 MED ORDER — LACTATED RINGERS IV SOLN: INTRAVENOUS | Status: DC

## 2023-06-18 MED ORDER — PHENYLEPHRINE 80 MCG/ML (10ML) SYRINGE FOR IV PUSH (FOR BLOOD PRESSURE SUPPORT)
PREFILLED_SYRINGE | INTRAVENOUS | Status: AC
  Filled 2023-06-18: qty 10

## 2023-06-18 MED ORDER — MIDAZOLAM HCL 2 MG/2ML IJ SOLN
INTRAMUSCULAR | Status: DC | PRN
  Administered 2023-06-18 (×2): 1 mg via INTRAVENOUS

## 2023-06-18 MED ORDER — OXYCODONE HCL 5 MG PO TABS: 5.0000 mg | ORAL_TABLET | Freq: Once | ORAL | Status: DC | PRN

## 2023-06-18 MED ORDER — PHENYLEPHRINE 80 MCG/ML (10ML) SYRINGE FOR IV PUSH (FOR BLOOD PRESSURE SUPPORT)
PREFILLED_SYRINGE | INTRAVENOUS | Status: DC | PRN
Start: 2023-06-18 — End: 2023-06-18
  Administered 2023-06-18 (×2): 160 ug via INTRAVENOUS

## 2023-06-18 MED ORDER — ORAL CARE MOUTH RINSE: 15.0000 mL | OROMUCOSAL | Status: DC | PRN

## 2023-06-18 MED ORDER — ORAL CARE MOUTH RINSE: 15.0000 mL | Freq: Once | OROMUCOSAL | Status: AC

## 2023-06-18 MED ORDER — ESMOLOL HCL 100 MG/10ML IV SOLN
INTRAVENOUS | Status: DC | PRN
  Administered 2023-06-18: 20 mg via INTRAVENOUS
  Administered 2023-06-18: 10 mg via INTRAVENOUS
  Administered 2023-06-18: 30 mg via INTRAVENOUS
  Administered 2023-06-18 (×2): 20 mg via INTRAVENOUS

## 2023-06-18 MED ORDER — ONDANSETRON HCL 4 MG/2ML IJ SOLN
INTRAMUSCULAR | Status: DC | PRN
Start: 2023-06-18 — End: 2023-06-18
  Administered 2023-06-18: 4 mg via INTRAVENOUS

## 2023-06-18 MED ORDER — PROPOFOL 10 MG/ML IV BOLUS
INTRAVENOUS | Status: DC | PRN
  Administered 2023-06-18: 100 mg via INTRAVENOUS
  Administered 2023-06-18: 70 mg via INTRAVENOUS

## 2023-06-18 MED ORDER — PROPOFOL 10 MG/ML IV BOLUS
INTRAVENOUS | Status: AC
  Filled 2023-06-18: qty 20

## 2023-06-18 MED ORDER — ESMOLOL HCL 100 MG/10ML IV SOLN
INTRAVENOUS | Status: AC
  Filled 2023-06-18: qty 10

## 2023-06-18 MED ORDER — FENTANYL CITRATE PF 50 MCG/ML IJ SOSY: 25.0000 ug | PREFILLED_SYRINGE | INTRAMUSCULAR | Status: DC | PRN

## 2023-06-18 MED ORDER — 0.9 % SODIUM CHLORIDE (POUR BTL) OPTIME
TOPICAL | Status: DC | PRN
  Administered 2023-06-18: 1000 mL

## 2023-06-18 MED ORDER — LIDOCAINE HCL (PF) 2 % IJ SOLN
INTRAMUSCULAR | Status: AC
  Filled 2023-06-18: qty 5

## 2023-06-18 MED ORDER — LIDOCAINE 2% (20 MG/ML) 5 ML SYRINGE
INTRAMUSCULAR | Status: DC | PRN
  Administered 2023-06-18: 80 mg via INTRAVENOUS

## 2023-06-18 MED ORDER — DEXAMETHASONE SODIUM PHOSPHATE 4 MG/ML IJ SOLN
INTRAMUSCULAR | Status: DC | PRN
  Administered 2023-06-18: 5 mg via INTRAVENOUS

## 2023-06-18 MED ORDER — ONDANSETRON HCL 4 MG/2ML IJ SOLN: 4.0000 mg | Freq: Once | INTRAMUSCULAR | Status: DC | PRN

## 2023-06-18 SURGICAL SUPPLY — 74 items
APL PRP STRL LF DISP 70% ISPRP (MISCELLANEOUS) ×1
BANDAGE ESMARK 6X9 LF (GAUZE/BANDAGES/DRESSINGS) ×2 IMPLANT
BIT DRILL 2.8 (DRILL) ×1
BIT DRILL CALIBRATED AO 3.5 (DRILL) IMPLANT
BIT DRL 2.8 (DRILL) ×1 IMPLANT
BLADE SURG 15 STRL LF DISP TIS (BLADE) ×2 IMPLANT
BLADE SURG 15 STRL SS (BLADE) ×1
BLADE SURG SZ10 CARB STEEL (BLADE) ×2 IMPLANT
BNDG CMPR 9X6 STRL LF SNTH (GAUZE/BANDAGES/DRESSINGS) ×1
BNDG CMPR STD VLCR NS LF 5.8X4 (GAUZE/BANDAGES/DRESSINGS) ×1
BNDG CMPR STD VLCR NS LF 5.8X6 (GAUZE/BANDAGES/DRESSINGS) ×1
BNDG COHESIVE 4X5 TAN STRL (GAUZE/BANDAGES/DRESSINGS) ×2 IMPLANT
BNDG ELASTIC 4X5.8 VLCR NS LF (GAUZE/BANDAGES/DRESSINGS) ×2 IMPLANT
BNDG ELASTIC 6X5.8 VLCR NS LF (GAUZE/BANDAGES/DRESSINGS) ×2 IMPLANT
BNDG ESMARK 6X9 LF (GAUZE/BANDAGES/DRESSINGS) ×1 IMPLANT
BNDG GAUZE ROLL STR 2.25X3YD (GAUZE/BANDAGES/DRESSINGS) IMPLANT
BNDG GZE SM 3X2.25 6 PLY (GAUZE/BANDAGES/DRESSINGS) ×1
CHIPS CORTICOCANCELLOUS 10CC (Bone Implant) ×1 IMPLANT
CHLORAPREP W/TINT 26 (MISCELLANEOUS) ×2 IMPLANT
CLOTH BEACON ORANGE TIMEOUT ST (SAFETY) ×2 IMPLANT
COVER LIGHT HANDLE STERIS (MISCELLANEOUS) ×4 IMPLANT
COVER MAYO STAND XLG (MISCELLANEOUS) ×2 IMPLANT
CUFF TOURN SGL QUICK 34 (TOURNIQUET CUFF) ×1
CUFF TRNQT CYL 34X4.125X (TOURNIQUET CUFF) IMPLANT
DECANTER SPIKE VIAL GLASS SM (MISCELLANEOUS) ×2 IMPLANT
DRAPE C-ARM FOLDED MOBILE STRL (DRAPES) ×2 IMPLANT
DRILL 2.8 FLUTED PT F/INT FIX (DRILL) IMPLANT
DRILL CALIBRATED AO 3.5 (DRILL) ×1 IMPLANT
ELECT REM PT RETURN 9FT ADLT (ELECTROSURGICAL) ×1 IMPLANT
ELECTRODE REM PT RTRN 9FT ADLT (ELECTROSURGICAL) ×2 IMPLANT
GAUZE SPONGE 4X4 12PLY STRL (GAUZE/BANDAGES/DRESSINGS) IMPLANT
GLOVE BIO SURGEON STRL SZ8 (GLOVE) ×6 IMPLANT
GLOVE BIOGEL PI IND STRL 7.0 (GLOVE) ×4 IMPLANT
GLOVE SRG 8 PF TXTR STRL LF DI (GLOVE) ×2 IMPLANT
GLOVE SURG UNDER POLY LF SZ8 (GLOVE) ×1
GOWN STRL REUS W/ TWL XL LVL3 (GOWN DISPOSABLE) ×2 IMPLANT
GOWN STRL REUS W/TWL LRG LVL3 (GOWN DISPOSABLE) ×4 IMPLANT
GOWN STRL REUS W/TWL XL LVL3 (GOWN DISPOSABLE) ×1
GRAFT BNE CORT CANC CHIPS 10CC (Bone Implant) IMPLANT
GUIDEPIN TT 1.5X150 (PIN) IMPLANT
IMMOBILIZER KNEE 19 UNV (ORTHOPEDIC SUPPLIES) ×2 IMPLANT
KIT TURNOVER KIT A (KITS) ×2 IMPLANT
MANIFOLD NEPTUNE II (INSTRUMENTS) ×2 IMPLANT
NDL HYPO 21X1.5 SAFETY (NEEDLE) ×2 IMPLANT
NEEDLE HYPO 21X1.5 SAFETY (NEEDLE) ×1 IMPLANT
NS IRRIG 1000ML POUR BTL (IV SOLUTION) ×2 IMPLANT
PACK BASIC LIMB (CUSTOM PROCEDURE TRAY) ×2 IMPLANT
PAD ABD 5X9 TENDERSORB (GAUZE/BANDAGES/DRESSINGS) ×2 IMPLANT
PAD ARMBOARD 7.5X6 YLW CONV (MISCELLANEOUS) ×2 IMPLANT
PAD CAST 4YDX4 CTTN HI CHSV (CAST SUPPLIES) ×2 IMPLANT
PADDING CAST COTTON 4X4 STRL (CAST SUPPLIES) ×1
PADDING CAST COTTON 6X4 STRL (CAST SUPPLIES) ×2 IMPLANT
PLATE TIBIA PROX RT 3H (Plate) IMPLANT
POSITIONER HEAD 8X9X4 ADT (SOFTGOODS) ×2 IMPLANT
SCREW LOCK 3.5X65 (Screw) IMPLANT
SCREW LOCK VA 3.5X60 (Screw) IMPLANT
SCREW LOCK VA 3.5X70 (Screw) IMPLANT
SCREW NL CORT 3.5X24 (Screw) IMPLANT
SCREW NLOCK IFX 3.5X28 (Screw) IMPLANT
SET BASIN LINEN APH (SET/KITS/TRAYS/PACK) ×2 IMPLANT
SPONGE T-LAP 18X18 ~~LOC~~+RFID (SPONGE) ×2 IMPLANT
STAPLER VISISTAT (STAPLE) IMPLANT
STRIP CLOSURE SKIN 1/2X4 (GAUZE/BANDAGES/DRESSINGS) IMPLANT
SUT MNCRL 0 VIOLET CTX 36 (SUTURE) ×2 IMPLANT
SUT MNCRL AB 4-0 PS2 18 (SUTURE) IMPLANT
SUT MON AB 0 CT1 (SUTURE) ×2 IMPLANT
SUT MON AB 2-0 SH 27 (SUTURE)
SUT MON AB 2-0 SH27 (SUTURE) IMPLANT
SUT NUROLON CT 2 BLK #1 18IN (SUTURE) IMPLANT
SUT VIC AB 1 CT1 27 (SUTURE) ×2
SUT VIC AB 1 CT1 27XBRD ANTBC (SUTURE) ×4 IMPLANT
SUT VIC AB 2-0 CT2 27 (SUTURE) IMPLANT
SYR 30ML LL (SYRINGE) ×2 IMPLANT
SYR BULB IRRIG 60ML STRL (SYRINGE) ×4 IMPLANT

## 2023-06-18 NOTE — Anesthesia Preprocedure Evaluation (Signed)
Anesthesia Evaluation  Patient identified by MRN, date of birth, ID band Patient awake    Reviewed: Allergy & Precautions, H&P , NPO status , Patient's Chart, lab work & pertinent test results, reviewed documented beta blocker date and time   Airway Mallampati: II  TM Distance: >3 FB Neck ROM: full    Dental no notable dental hx.    Pulmonary neg pulmonary ROS, asthma , COPD, former smoker   Pulmonary exam normal breath sounds clear to auscultation       Cardiovascular Exercise Tolerance: Good hypertension, + angina  + CAD and +CHF  + Valvular Problems/Murmurs (S/P AVR)  Rhythm:regular Rate:Normal     Neuro/Psych  PSYCHIATRIC DISORDERS Anxiety     negative neurological ROS  negative psych ROS   GI/Hepatic negative GI ROS, Neg liver ROS,GERD  ,,  Endo/Other  negative endocrine ROSHypothyroidism    Renal/GU Renal diseasenegative Renal ROS  negative genitourinary   Musculoskeletal   Abdominal   Peds  Hematology negative hematology ROS (+) Blood dyscrasia, anemia   Anesthesia Other Findings   Reproductive/Obstetrics negative OB ROS                             Anesthesia Physical Anesthesia Plan  ASA: 3  Anesthesia Plan: General and General LMA   Post-op Pain Management:    Induction:   PONV Risk Score and Plan: Ondansetron  Airway Management Planned:   Additional Equipment:   Intra-op Plan:   Post-operative Plan:   Informed Consent: I have reviewed the patients History and Physical, chart, labs and discussed the procedure including the risks, benefits and alternatives for the proposed anesthesia with the patient or authorized representative who has indicated his/her understanding and acceptance.     Dental Advisory Given  Plan Discussed with: CRNA  Anesthesia Plan Comments:        Anesthesia Quick Evaluation

## 2023-06-18 NOTE — Transfer of Care (Signed)
Immediate Anesthesia Transfer of Care Note  Patient: [name]  Procedure(s) Performed: OPEN REDUCTION INTERNAL FIXATION (ORIF) TIBIAL PLATEAU (Right: Leg Lower)  Patient Location: PACU  Anesthesia Type:General  Level of Consciousness: awake  Airway & Oxygen Therapy: Patient Spontanous Breathing and Patient connected to face mask oxygen  Post-op Assessment: Report given to RN and Post -op Vital signs reviewed and stable  Post vital signs: Reviewed and stable  Last Vitals:  Vitals Value Taken Time  BP 129/85 06/18/23 1445  Temp    Pulse 130 06/18/23 1446  Resp 16 06/18/23 1446  SpO2 95 % 06/18/23 1446  Vitals shown include unfiled device data.  Last Pain:  Vitals:   06/18/23 1128  TempSrc: Oral  PainSc: 0-No pain      Patients Stated Pain Goal: 0 (06/12/23 0351)  Complications: No notable events documented.

## 2023-06-18 NOTE — Op Note (Signed)
Orthopaedic Surgery Operative Note (CSN: [id number])  [name]  1951-01-05 Date of Surgery: 06/18/2023   Diagnoses:  Right lateral tibial plateau fracture  Procedure: Operative fixation of right lateral tibial plateau fracture   Operative Finding Successful completion of the planned procedure.  Depression of the joint line was improved, and bone graft was placed within the defect.  Multiple rafting screws were placed through a lateral based plate.  Overall alignment improved.  Lateral meniscus repair at the conclusion of the case.   Post-Op Diagnosis: Same Surgeons:Primary: Oliver Barre, MD Assistants: Cecile Sheerer Location: [facility] OR ROOM 4 Anesthesia: General with local anesthesia Antibiotics: Ancef 2 g Tourniquet time:  Total Tourniquet Time Documented: Thigh (Right) - 98 minutes Total: Thigh (Right) - 98 minutes  Estimated Blood Loss: 100 cc Complications: None Specimens: None  Implants: Implant Name Type Inv. Item Serial No. Manufacturer Lot No. LRB No. Used Action  CHIPS CORTICOCANCELLOUS 10CC - [id number] Bone Implant CHIPS CORTICOCANCELLOUS 10CC [id number] [employer]  Right 1 Implanted  3.5 x 24mm cortical screw    [employer] STERILE ON SET FROM SPD Right 1 Implanted  SCREW NLOCK IFX 3.5X28 - [id number] Screw SCREW NLOCK IFX 3.5X28  [employer] STERILE ON SET FROM SPD Right 1 Implanted  3.5 x 70mm cortical screw    [employer] STERILE ON SET FROM SPD Right 3 Implanted  3.5 x 65mm cortical screw    [employer] STERILE ON SET FROM SPD Right 1 Implanted  PLATE TIBIA PROX RT 3H - [id number] Plate PLATE TIBIA PROX RT 3H  [employer] STERILE ON SET FROM SPD Right 1 Implanted  3.5 x 60mm locking screw    [employer] STERILE ON SET FROM SPD Right 1 Implanted    Indications for Surgery:   [name] is a 72 y.o. female who fell and sustained a right lateral tibial plateau fracture depression.  There was no obvious split.  Due to the extent of the depression  demonstrated on both radiographs, as well as a CT scan, I recommended operative fixation.  She was cleared by cardiology.  She is on Coumadin, but was transitioned to a heparin drip for surgery.  Benefits and risks of operative and nonoperative management were discussed prior to surgery with the patient and informed consent form was completed.  Specific risks including infection, need for additional surgery, nonunion, malunion, stiffness, persistent pain, bleeding and more severe complications associated with anesthesia were discussed.  All questions have been answered.  She elected to proceed.  Surgical consent was finalized.   Procedure:   The patient was identified properly. Informed consent was obtained and the surgical site was marked. The patient was taken to the OR where general anesthesia was induced.  The patient was positioned supine, with the right leg on bone foam.  The right leg was prepped and draped in the usual sterile fashion.  Timeout was performed before the beginning of the case.  Tourniquet was used for the above duration.  She received 2 g of Ancef prior to making incision.  We made a curvilinear incision centered over the lateral aspect of the right knee, extending anteriorly across the anterior crest of the tibial shaft.  We incised sharply through skin.  We made full-thickness flaps through subcutaneous fat.  The fascia overlying the TA was identified.  We made a nick in this fascia, then completed to release it with Metzenbaum scissors.  There was a cuff of the fascia remaining on the anterior  aspect of the tibia, for later closure.  We continued to dissect proximally extending over the proximal tib-fib joint.  The IT band was incised, and the capsule of the knee joint was identified.  We created flaps through the IT band without breaching the capsule extending proximally.  With the assistance of fluoroscopy, we made a submeniscal arthrotomy.  The meniscus was identified.  We placed  sutures within the meniscus for later repair, as well as to help with retraction.  The meniscus was retracted, and a varus force was placed on the knee to identify the joint surface.  There was a posterior lateral fracture of the lateral tibial plateau.  This extended to the rim.  With the assistance of fluoroscopy, we made a corticotomy in the proximal tibia.  We introduced a bone tamp and gently Used the tamp to reduce the joint line.  This was done under fluoroscopy.  We are able to visualize improved alignment of the lateral joint line.  Using orthogonal views, the joint line was restored.  Once again, we did have a lateral rim fracture, which was difficult to reduce.  Once were satisfied with the reduction, we placed 2 K wires across the joint line, within the subchondral bone.  We then used bone chips to fill the void, and tamped these into the defect within the proximal tibia.  Fluoroscopy at this point confirmed improvement of the joint line after placement of the bone graft.  The above stated plate was selected from the back table.  This was provisionally secured to the anterior lateral aspect of the tibia.  We then placed multiple K wires for provisional fixation.  We placed a single bicortical screw in the shaft, to secure the plate to the anterior lateral aspect of the tibia.  Fluoroscopy confirmed placement of the plate, as well as a bicortical shaft screw.  We then proceeded to place multiple rafting screws within the subchondral bone.  These locked into the plate.  This was done under direct fluoroscopy.  Once were satisfied with the position and the screws in the proximal cluster, we placed a single oblique screw extending from lateral to medial.  Once again we used fluoroscopy to confirm placement.  Finally, we placed an additional bicortical screw in the shaft of the tibia.  Final fluoroscopic images confirmed appropriate placement of the plate and screws, with restoration of the lateral joint  line.   We irrigated the wound copiously.  The sutures that were within the meniscus were passed through the proximal extent of the plate, and tied to repair the meniscus.  We then closed the IT band proximally, as well as the remaining capsulotomy.  The remainder of the incision was closed in a multilayer fashion with absorbable suture.  Sterile dressing was placed.  She was placed in a knee immobilizer.  Patient was awoken taken to PACU in stable condition.   Post-operative plan:  The patient will be NWB on the operative extremity Discharge home from the PACU once they have recovered DVT prophylaxis per primary team, no orthopedic contraindications.   Can resume heparin.  Transition to Coumadin starting postop day #1. Pain control with PRN pain medication preferring oral medicines.   Follow up plan will be scheduled in approximately 10-14 days for incision check and XR.

## 2023-06-18 NOTE — Anesthesia Procedure Notes (Signed)
Procedure Name: Intubation Date/Time: 06/18/2023 12:29 PM  Performed by: Julian Reil, CRNAPre-anesthesia Checklist: Patient identified, Emergency Drugs available, Suction available and Patient being monitored Patient Re-evaluated:Patient Re-evaluated prior to induction Oxygen Delivery Method: Circle system utilized Preoxygenation: Pre-oxygenation with 100% oxygen Induction Type: IV induction Ventilation: Mask ventilation without difficulty Laryngoscope Size: Glidescope and 3 Grade View: Grade I Tube type: Oral Tube size: 7.0 mm Number of attempts: 2 Airway Equipment and Method: Stylet and Video-laryngoscopy Placement Confirmation: ETT inserted through vocal cords under direct vision, positive ETCO2 and breath sounds checked- equal and bilateral Secured at: 22 cm Tube secured with: Tape Dental Injury: Teeth and Oropharynx as per pre-operative assessment  Comments: 1st DL Miller 3, grade 4 view, large floppy epiglottis, 2nd DL with Glidescope as noted above.

## 2023-06-18 NOTE — Progress Notes (Signed)
   ORTHOPAEDIC PROGRESS NOTE  Scheduled for Procedure(s): ORIF R tibial plateau fracture   DOS: 06/18/2023  SUBJECTIVE: Patient is stable.  No issues over night.  Comfortable when not moving.  Heparin drop has stopped.  NPO.  Cardiology has cleared him for surgery.  OBJECTIVE: PE:  Alert and oriented.  No acute distress  Right knee without swelling. Lesions on lower leg Sensation intact distally Active motion EHL/TA Pain with motion   Vitals:   06/18/23 0435 06/18/23 1128  BP: 109/85 134/87  Pulse: 87 (!) 101  Resp:  (!) 24  Temp: 98.2 F (36.8 C) 98 F (36.7 C)  SpO2: 95% 97%     ASSESSMENT: [name] is a 72 y.o. female stable.  Right tibial plateau fracture, isolated lateral depression.  NPO after midnight.   PLAN: Weightbearing: NWB RLE Incisional and dressing care: No incision or dressing at this time.  Orthopedic device(s):  Knee immobilizer VTE prophylaxis: Per medicine and cardiology; bridge from Coumadin prior to to surgery.  Heparin drop stopped prior to surgery.  Pain control: As needed Follow - up plan:  TBD    Contact information:     Trentin Knappenberger A. Dallas Schimke, MD MS [facility] [address] [place],  [place]  [address] Phone: [phone] Fax: [phone]

## 2023-06-18 NOTE — Progress Notes (Signed)
PT Cancellation Note  Patient Details Name: [name] MRN: [id number] DOB: February 11, 1951   Cancelled Treatment:    Reason Eval/Treat Not Completed: Other (comment) Held PT session following discussion with RN due to plans for surgery later today.  Becky Sax, LPTA/CLT; CBIS [phone] Juel Burrow 06/18/2023, 9:41 AM

## 2023-06-18 NOTE — Progress Notes (Signed)
ANTICOAGULATION CONSULT NOTE -  Pharmacy Consult for Heparin Indication: atrial fibrillation and aortic valve replacement  Allergies  Allergen Reactions   Penicillin G Itching and Other (See Comments)   Penicillins Hives, Itching, Swelling and Rash    Patient Measurements: Height: 5\' 8"  (172.7 cm) Weight: 71.6 kg (157 lb 13.6 oz) IBW/kg (Calculated) : 63.9 Heparin Dosing Weight: 72.2 kg  Vital Signs: Temp: 98.2 F (36.8 C) (08/29 0435) Temp Source: Oral (08/29 0435) BP: 109/85 (08/29 0435) Pulse Rate: 87 (08/29 0435)  Labs: Recent Labs    06/16/23 0410 06/17/23 0421 06/17/23 1850 06/18/23 0458  HGB 9.6* 10.0*  --  9.8*  HCT 30.3* 30.8*  --  31.1*  PLT 169 183  --  179  LABPROT 25.4* 22.0*  --  21.0*  INR 2.3* 1.9*  --  1.8*  HEPARINUNFRC  --   --  0.31 0.42  CREATININE 1.11* 1.06*  --   --     Estimated Creatinine Clearance: 49.1 mL/min (A) (by C-G formula based on SCr of 1.06 mg/dL (H)).   Medical History: Past Medical History:  Diagnosis Date   Anxiety disorder    Aortic stenosis    Status post [employer] mechanical AVR 2007   Asthma    Atrial fibrillation (HCC)    Carcinoid tumor of colon 2007   Chronic diastolic heart failure (HCC)    Coronary atherosclerosis of native coronary artery    Status post CABG 2007   GERD (gastroesophageal reflux disease)    History of colonoscopy 2003   Dr. 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 Jena Gauss, benign biopsies   RLS (restless legs syndrome)    Schatzki's ring     Medications:  Medications Prior to Admission  Medication Sig Dispense Refill Last Dose   ALPRAZolam (XANAX) 0.5 MG tablet Take 0.5 mg by mouth 2 (two) times daily.   06/11/2023   bisoprolol (ZEBETA) 5 MG tablet Take 5 mg by mouth daily.   06/11/2023   CAPLYTA 42 MG capsule Take 42 mg by mouth daily.   06/11/2023   diltiazem (CARDIZEM CD) 120 MG 24 hr capsule Take 1  capsule (120 mg total) by mouth daily. 30 capsule 2 06/11/2023   FARXIGA 10 MG TABS tablet Take 10 mg by mouth daily.   06/11/2023   HYDROcodone-acetaminophen (NORCO/VICODIN) 5-325 MG tablet Take 1 tablet by mouth 2 (two) times daily as needed for moderate pain or severe pain.   Past Week   ipratropium (ATROVENT) 0.06 % nasal spray Place 2 sprays into both nostrils in the morning and at bedtime.   06/11/2023   levothyroxine (SYNTHROID, LEVOTHROID) 50 MCG tablet Take 50 mcg by mouth daily before breakfast.    06/11/2023   Multiple Vitamin (MULTIVITAMIN WITH MINERALS) TABS tablet Take 1 tablet by mouth daily.   06/11/2023   omeprazole (PRILOSEC) 20 MG capsule Take 20 mg by mouth daily.   06/11/2023   OXYGEN Inhale 2 L into the lungs daily.    06/11/2023   rOPINIRole (REQUIP) 3 MG tablet Take 3 mg by mouth 2 (two) times daily.   06/11/2023   rosuvastatin (CRESTOR) 10 MG tablet Take 10 mg by mouth daily.   06/11/2023   torsemide (DEMADEX) 100 MG tablet Take 100 mg by mouth daily.   06/11/2023   warfarin (COUMADIN) 2 MG tablet Take 1.5-2 tablets (3-4 mg total) by mouth  See admin instructions. 4mg  on Tuesday and 3 mg daily the rest of the week.   06/11/2023   bisoprolol (ZEBETA) 10 MG tablet Take 10 mg by mouth daily.      rosuvastatin (CRESTOR) 20 MG tablet TAKE ONE (1) TABLET BY MOUTH EVERY DAY 90 tablet 3     Assessment: Pharmacy consulted to dose heparin in patient with atrial fibrillation and mechanical AVR. Patient's warfarin home dose listed as 1 mg on Fridays and 2 mg ROW. Has held warfarin since 8/26 for planned surgery on 8/29.  INR 2.1> 2.3 > 1.9> 1.8- subtherapeutic. Platelets WNL. Hemoglobin stable around 10. Will start IV heparin.   Heparin level 0.42 which is therapeutic.   Goal of Therapy:  INR 2-3 Monitor platelets by anticoagulation protocol: Yes   Plan:   Continue heparin infusion at 1000 units/hr. Check heparin level with am labs.   Judeth Cornfield, PharmD Clinical  Pharmacist 06/18/2023 8:18 AM

## 2023-06-18 NOTE — Progress Notes (Signed)
[facility]  PROGRESS NOTE  SIMONETTA PATIENCE ZOX:[id number] DOB: 03-15-1951 DOA: 06/11/2023 PCP: Rebekah Chesterfield, NP   Brief HPI:   72 year old female with PMH of chronic diastolic CHF, chronic respiratory failure on home oxygen 2 L/min, atrial fibrillation and mechanical AVR on warfarin anticoagulation, COPD, presented to the ED on 8/22 following a fall with left-sided chest and right knee pain. Admitted for nondisplaced left anterior 4-7 rib fractures, comminuted displaced fracture involving the right lateral tibial plateau. Orthopedics consulted and plan ORIF on 06/18/2023. Cardiology consulted and provided preop clearance.     Assessment/Plan:   Right lateral tibial plateau fracture Orthopedic consultation appreciated.   Initial plans were to consider elective surgery admission as an outpatient. Now ORIF planned for 06/18/2023.   In preparation for surgery, Coumadin was discontinued and to be bridged with IV heparin for underlying history of A-fib and mechanical AVR.  Postop IV heparin bridging and Warfarin to be resumed as soon as able and continued until INR therapeutic between 2 and 3.   Cardiology consulted, input appreciated, patient at increased risk but not prohibitive risk for surgery and recommended proceeding with surgery with no further cardiac testing or intervention. Agree that patient overall is at increased risk from very advanced age, O2 dependent chronic respiratory failure, extensive cardiac history as noted but appears optimized as best as possible from those aspects and may proceed with indicated surgery to minimize further morbidity, with close perioperative monitoring, maintaining in and out even. Nonweightbearing right lower extremity in knee immobilizer. PT has seen and obviously recommend SNF. Patient waiting for surgery in am   Hyponatremia -Sodium is down to 128 today -Check serum and urine osmolality, urine sodium -Start fluid restriction   Acute blood  loss anemia May be related to the tibial fracture Hemoglobin dropped from 12.1 on 8/22-9.4.   -Hemoglobin  has improved to 10.0 this morning Follow CBC postop and transfuse if hemoglobin 7 g or less   Thrombocytopenia Again could be due to acute blood loss. Resolved   Nondisplaced left anterior 4-7 rib fractures/left chest wall pain Optimize nonopioid pain regimen, incentive spirometry encouraged.    Hypothyroidism -Continue Synthroid.   Hypokalemia Replaced.   Magnesium 2.2.   Benign essential HTN -Blood pressure is labile between soft and normal -Monitor closely. -Holding antihypertensive agents at the moment.   Chronic diastolic CHF (congestive heart failure) (HCC) -Stable and compensated despite holding of diuretics.  Last echo 08/2022 EF of 55 to 60%. -Holding torsemide and beta-blocker in the setting of soft blood pressure -Continue to follow daily weights and strict I's and O's.   CKD (chronic kidney disease) stage 3b, GFR 30-59 ml/min (HCC) -Creatinine stable in the 1.4-1.5 range -Monitor closely.  Avoid hypotension or nephrotoxic's. -Creatinine has actually improved to 1.11.   Chronic anticoagulation for permanent A-fib and mechanical AVR As per cardiology office note on 03/30/2023, history of aortic stenosis, s/p St Jude mechanical AVR in 2007 along with graft repair of AAA. On warfarin for atrial fibrillation. -Continue holding bisoprolol and diltiazem in the setting of soft blood pressure. 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   GERD Continue PPI   Hyperlipidemia Patient/family unsure of  dose of  Crestor. Resumed lower-dose Crestor 10 Mg daily.    Medications     acetaminophen  1,000 mg Oral QID   levothyroxine  50 mcg Oral Q0600   lumateperone tosylate  42 mg Oral Daily   pantoprazole  40 mg Oral Daily   rOPINIRole  3 mg Oral BID   rosuvastatin  10 mg Oral Daily     Data Reviewed:   CBG:  No results for input(s): "GLUCAP" in the last 168 hours.  SpO2: 95 % O2 Flow Rate (L/min): 2 L/min    Vitals:   06/17/23 0800 06/17/23 1403 06/17/23 2030 06/18/23 0435  BP:  128/74 (!) 111/91 109/85  Pulse:  86 89 87  Resp: (!) 26 (!) 24    Temp:  99.3 F (37.4 C) 98.1 F (36.7 C) 98.2 F (36.8 C)  TempSrc:   Oral Oral  SpO2:  97% 96% 95%  Weight:    71.6 kg  Height:          Data Reviewed:  Basic Metabolic Panel: Recent Labs  Lab 06/11/23 1547 06/12/23 0431 06/16/23 0410 06/17/23 0421  NA 139 136 130* 128*  K 3.1* 3.7 4.2 4.1  CL 97* 100 97* 96*  CO2 32 28 27 24   GLUCOSE 95 95 82 85  BUN 22 21 13 14   CREATININE 1.46* 1.54* 1.11* 1.06*  CALCIUM 9.4 8.4* 8.5* 8.4*  MG 2.2  --   --   --     CBC: Recent Labs  Lab 06/11/23 1547 06/12/23 0431 06/13/23 0456 06/14/23 0449 06/16/23 0410 06/17/23 0421 06/18/23 0458  WBC 11.1*   < > 6.9 5.2 4.4 5.5 4.5  NEUTROABS 8.6*  --   --   --   --   --   --   HGB 12.1   < > 9.4* 9.5* 9.6* 10.0* 9.8*  HCT 38.1   < > 30.0* 30.7* 30.3* 30.8* 31.1*  MCV 90.3   < > 92.6 91.4 89.9 89.3 90.1  PLT 212   < > 136* 136* 169 183 179   < > = values in this interval not displayed.    LFT Recent Labs  Lab 06/11/23 1547  AST 33  ALT 20  ALKPHOS 69  BILITOT 0.8  PROT 7.8  ALBUMIN 4.2     Antibiotics: Anti-infectives (From admission, onward)    Start     Dose/Rate Route Frequency Ordered Stop   06/18/23 0600  ceFAZolin (ANCEF) IVPB 2g/100 mL premix        2 g 200 mL/hr over 30 Minutes Intravenous On call to O.R. 06/16/23 2153 06/19/23 0559        DVT prophylaxis: On IV heparin  Code Status: Full  code  Family Communication:    CONSULTS Orthopedics   Subjective   Denies any complaints.  Plan for surgery today.   Objective    Physical Examination:  General-appears in no acute distress Heart-S1-S2, regular, no murmur auscultated Lungs-clear to auscultation bilaterally, no wheezing or crackles auscultated Abdomen-soft, nontender, no organomegaly Extremities-no edema in the lower extremities Neuro-alert, oriented x3, no focal deficit noted   Status is: Inpatient:             Meredeth Ide   [facility] If 7PM-7AM, please contact night-coverage at [email or web address], Office  [phone]   06/18/2023, 10:18 AM  LOS: 7 days

## 2023-06-18 NOTE — Care Management Important Message (Signed)
Important Message  Patient Details  Name: [name] MRN: [id number] Date of Birth: 09/18/1951   Medicare Important Message Given:  Yes     Corey Harold 06/18/2023, 10:20 AM

## 2023-06-19 DIAGNOSIS — S82121D Displaced fracture of lateral condyle of right tibia, subsequent encounter for closed fracture with routine healing: Secondary | ICD-10-CM | POA: Diagnosis not present

## 2023-06-19 DIAGNOSIS — E039 Hypothyroidism, unspecified: Secondary | ICD-10-CM | POA: Diagnosis not present

## 2023-06-19 DIAGNOSIS — I482 Chronic atrial fibrillation, unspecified: Secondary | ICD-10-CM | POA: Diagnosis not present

## 2023-06-19 DIAGNOSIS — S2242XA Multiple fractures of ribs, left side, initial encounter for closed fracture: Secondary | ICD-10-CM | POA: Diagnosis not present

## 2023-06-19 LAB — CBC
HCT: 29.3 % — ABNORMAL LOW (ref 36.0–46.0)
Hemoglobin: 9.3 g/dL — ABNORMAL LOW (ref 12.0–15.0)
MCH: 28.9 pg (ref 26.0–34.0)
MCHC: 31.7 g/dL (ref 30.0–36.0)
MCV: 91 fL (ref 80.0–100.0)
Platelets: 193 10*3/uL (ref 150–400)
RBC: 3.22 MIL/uL — ABNORMAL LOW (ref 3.87–5.11)
RDW: 14.4 % (ref 11.5–15.5)
WBC: 8 10*3/uL (ref 4.0–10.5)
nRBC: 0 % (ref 0.0–0.2)

## 2023-06-19 LAB — BASIC METABOLIC PANEL
Anion gap: 6 (ref 5–15)
BUN: 12 mg/dL (ref 8–23)
CO2: 27 mmol/L (ref 22–32)
Calcium: 8.4 mg/dL — ABNORMAL LOW (ref 8.9–10.3)
Chloride: 98 mmol/L (ref 98–111)
Creatinine, Ser: 1.16 mg/dL — ABNORMAL HIGH (ref 0.44–1.00)
GFR, Estimated: 50 mL/min — ABNORMAL LOW (ref >60)
Glucose, Bld: 99 mg/dL (ref 70–99)
Potassium: 3.9 mmol/L (ref 3.5–5.1)
Sodium: 131 mmol/L — ABNORMAL LOW (ref 135–145)

## 2023-06-19 LAB — PROTIME-INR
INR: 1.7 — ABNORMAL HIGH (ref 0.8–1.2)
Prothrombin Time: 19.9 s — ABNORMAL HIGH (ref 11.4–15.2)

## 2023-06-19 LAB — HEPARIN LEVEL (UNFRACTIONATED): Heparin Unfractionated: 0.28 [IU]/mL — ABNORMAL LOW (ref 0.30–0.70)

## 2023-06-19 MED ORDER — WARFARIN SODIUM 2 MG PO TABS
2.0000 mg | ORAL_TABLET | Freq: Once | ORAL | Status: AC
  Administered 2023-06-19: 2 mg via ORAL
  Filled 2023-06-19: qty 1

## 2023-06-19 MED ORDER — WARFARIN - PHARMACIST DOSING INPATIENT: Freq: Every day | Status: DC

## 2023-06-19 MED ORDER — DILTIAZEM HCL 30 MG PO TABS
30.0000 mg | ORAL_TABLET | Freq: Four times a day (QID) | ORAL | Status: DC
  Administered 2023-06-19 – 2023-06-24 (×17): 30 mg via ORAL
  Filled 2023-06-19 (×19): qty 1

## 2023-06-19 MED ORDER — OXYCODONE HCL 5 MG PO TABS
10.0000 mg | ORAL_TABLET | ORAL | Status: DC | PRN
  Administered 2023-06-19 – 2023-06-24 (×8): 10 mg via ORAL
  Filled 2023-06-19 (×10): qty 2

## 2023-06-19 MED ORDER — METOPROLOL TARTRATE 25 MG PO TABS
12.5000 mg | ORAL_TABLET | Freq: Two times a day (BID) | ORAL | Status: DC
  Administered 2023-06-19 – 2023-06-24 (×10): 12.5 mg via ORAL
  Filled 2023-06-19 (×10): qty 1

## 2023-06-19 NOTE — TOC Progression Note (Signed)
Transition of Care [facility]) - Progression Note    Patient Details  Name: [name] MRN: [id number] Date of Birth: 1951/04/28  Transition of Care [facility]) CM/SW Contact  Annice Needy, LCSW Phone Number: 06/19/2023, 3:53 PM  Clinical Narrative:    Authorization started by J. Christell Constant, case management associate.    Expected Discharge Plan: Home/Self Care Barriers to Discharge: Continued Medical Work up  Expected Discharge Plan and Services       Living arrangements for the past 2 months: Single Family Home                                       Social Determinants of Health (SDOH) Interventions SDOH Screenings   Food Insecurity: No Food Insecurity (06/11/2023)  Housing: Low Risk  (06/11/2023)  Transportation Needs: No Transportation Needs (06/11/2023)  Utilities: Not At Risk (06/11/2023)  Financial Resource Strain: Low Risk  (07/28/2018)  Physical Activity: Inactive (07/28/2018)  Social Connections: Somewhat Isolated (07/28/2018)  Stress: No Stress Concern Present (07/28/2018)  Tobacco Use: Medium Risk (06/18/2023)    Readmission Risk Interventions     No data to display

## 2023-06-19 NOTE — Care Management Important Message (Signed)
Important Message  Patient Details  Name: [name] MRN: [id number] Date of Birth: 1951/01/06   Medicare Important Message Given:  Yes     Corey Harold 06/19/2023, 11:07 AM

## 2023-06-19 NOTE — Progress Notes (Signed)
[facility]  PROGRESS NOTE  [name]:[id number] DOB: 11-Jun-1951 DOA: 06/11/2023 PCP: Rebekah Chesterfield, NP   Brief HPI:   71 year old female with PMH of chronic diastolic CHF, chronic respiratory failure on home oxygen 2 L/min, atrial fibrillation and mechanical AVR on warfarin anticoagulation, COPD, presented to the ED on 8/22 following a fall with left-sided chest and right knee pain. Admitted for nondisplaced left anterior 4-7 rib fractures, comminuted displaced fracture involving the right lateral tibial plateau. Orthopedics consulted and plan ORIF on 06/18/2023. Cardiology consulted and provided preop clearance.     Assessment/Plan:   Right lateral tibial plateau fracture Orthopedic consultation obtained, patient underwent fixation of right lateral tibial plateau fracture -Started back on heparin and warfarin Cardiology consulted, input appreciated, patient at increased risk but not prohibitive risk for surgery and recommended proceeding with surgery with no further cardiac testing or intervention. Nonweightbearing right lower extremity in knee immobilizer. -Follow-up orthopedics in 2 weeks    Hyponatremia -Sodium improved to 131 -Osmolality 288 -Urine osmolality and urine sodium pending -Continue fluid restriction   Acute blood loss anemia May be related to the tibial fracture Hemoglobin dropped from 12.1 on 8/22-9.4.   Follow CBC  and transfuse if hemoglobin 7 g or less   Thrombocytopenia Again could be due to acute blood loss. Resolved   Nondisplaced left anterior 4-7 rib fractures/left chest wall pain Optimize nonopioid pain regimen, incentive spirometry encouraged.    Hypothyroidism -Continue Synthroid.   Hypokalemia Replaced.   Magnesium 2.2.   Benign essential HTN -Blood pressure is labile between soft and normal -Monitor closely. -Holding antihypertensive agents at the moment.   Chronic diastolic CHF (congestive heart failure)  (HCC) -Stable and compensated despite holding of diuretics.  Last echo 08/2022 EF of 55 to 60%. -Holding torsemide and beta-blocker in the setting of soft blood pressure -Continue to follow daily weights and strict I's and O's.   CKD (chronic kidney disease) stage 3b, GFR 30-59 ml/min (HCC) -Creatinine stable in the 1.4-1.5 range -Monitor closely.  Avoid hypotension or nephrotoxic's. -Creatinine has actually improved to 1.11.   Chronic anticoagulation for permanent A-fib and mechanical AVR As per cardiology office note on 03/30/2023, history of aortic stenosis, s/p St Jude mechanical AVR in 2007 along with graft repair of AAA. On warfarin for atrial fibrillation. -Continue holding bisoprolol and diltiazem in the setting of soft blood pressure. 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   GERD Continue PPI   Hyperlipidemia Patient/family unsure of dose of Crestor. Resumed lower-dose Crestor 10 Mg daily.    Medications     acetaminophen  1,000 mg Oral QID   levothyroxine  50 mcg Oral Q0600   lumateperone tosylate  42 mg Oral Daily   pantoprazole  40 mg Oral Daily   rOPINIRole  3 mg Oral BID   rosuvastatin  10 mg Oral Daily     Data Reviewed:   CBG:  No results for input(s): "GLUCAP" in the last 168 hours.  SpO2: 100 % O2 Flow Rate (L/min): 3 L/min    Vitals:   06/18/23 2013 06/19/23 0007 06/19/23 0424 06/19/23 0500  BP: (!) 137/93 (!) 117/102 120/73   Pulse: (!) 108 88 85   Resp: 20 19 20    Temp: 98.2 F (36.8 C)  98.4 F (36.9 C) 98.1 F (36.7 C)   TempSrc: Oral Oral Oral   SpO2: 97% 99% 100%   Weight:    71.4 kg  Height:          Data Reviewed:  Basic Metabolic Panel: Recent Labs   Lab 06/16/23 0410 06/17/23 0421 06/19/23 0447  NA 130* 128* 131*  K 4.2 4.1 3.9  CL 97* 96* 98  CO2 27 24 27   GLUCOSE 82 85 99  BUN 13 14 12   CREATININE 1.11* 1.06* 1.16*  CALCIUM 8.5* 8.4* 8.4*    CBC: Recent Labs  Lab 06/14/23 0449 06/16/23 0410 06/17/23 0421 06/18/23 0458 06/19/23 0447  WBC 5.2 4.4 5.5 4.5 8.0  HGB 9.5* 9.6* 10.0* 9.8* 9.3*  HCT 30.7* 30.3* 30.8* 31.1* 29.3*  MCV 91.4 89.9 89.3 90.1 91.0  PLT 136* 169 183 179 193    LFT No results for input(s): "AST", "ALT", "ALKPHOS", "BILITOT", "PROT", "ALBUMIN" in the last 168 hours.    Antibiotics: Anti-infectives (From admission, onward)    Start     Dose/Rate Route Frequency Ordered Stop   06/18/23 2000  ceFAZolin (ANCEF) IVPB 2g/100 mL premix        2 g 200 mL/hr over 30 Minutes Intravenous Every 8 hours 06/18/23 1626 06/19/23 1959   06/18/23 1152  ceFAZolin (ANCEF) 2-4 GM/100ML-% IVPB       Note to Pharmacy: Laureen Ochs R: cabinet override      06/18/23 1152 06/18/23 1241   06/18/23 0600  ceFAZolin (ANCEF) IVPB 2g/100 mL premix        2 g 200 mL/hr over 30 Minutes Intravenous On call to O.R. 06/16/23 2153 06/18/23 1246        DVT prophylaxis: On IV heparin  Code Status: Full code  Family Communication:    CONSULTS Orthopedics   Subjective   Complains of pain in the right lower extremity   Objective    Physical Examination:   General-appears in no acute distress Heart-S1-S2, regular, no murmur auscultated Lungs-clear to auscultation bilaterally, no wheezing or crackles auscultated Abdomen-soft, nontender, no organomegaly Extremities-right lower extremity in knee immobilizer Neuro-alert, oriented x3, no focal deficit noted  Status is: Inpatient:          Meredeth Ide   [facility] If 7PM-7AM, please contact night-coverage at [email or web address], Office  [phone]   06/19/2023, 9:18 AM  LOS: 8 days

## 2023-06-19 NOTE — Anesthesia Postprocedure Evaluation (Signed)
Anesthesia Post Note  Patient: [name]  Procedure(s) Performed: OPEN REDUCTION INTERNAL FIXATION (ORIF) TIBIAL PLATEAU (Right: Leg Lower)  Patient location during evaluation: Phase II Anesthesia Type: General Level of consciousness: awake Pain management: pain level controlled Vital Signs Assessment: post-procedure vital signs reviewed and stable Respiratory status: spontaneous breathing and respiratory function stable Cardiovascular status: blood pressure returned to baseline and stable Postop Assessment: no headache and no apparent nausea or vomiting Anesthetic complications: no Comments: Late entry   No notable events documented.   Last Vitals:  Vitals:   06/19/23 0424 06/19/23 1308  BP: 120/73 108/61  Pulse: 85 (!) 104  Resp: 20 (!) 24  Temp: 36.7 C 36.7 C  SpO2: 100% 90%    Last Pain:  Vitals:   06/19/23 1138  TempSrc:   PainSc: 4                  Windell Norfolk

## 2023-06-19 NOTE — Progress Notes (Signed)
Physical Therapy Treatment Patient Details Name: [name] MRN: [id number] DOB: September 28, 1951 Today's Date: 06/19/2023   History of Present Illness [name] is a 72 y.o. female with medical history significant for diastolic CHF, chronic respiratory failure on 2 L, atrial fibrillation, COPD.  Patient presented to the ED with complaints of a fall with left-sided chest and knee pain.  Patient got up onto a counter to clean when she lost her balance and fell in the bathroom, hitting the left side of her chest and her right leg following into the commode.  It took about 30 minutes to crawl to her phone to call her son.  She denies any chest pain, no dizziness, no change in breathing.  He is on warfarin and took her last dose this morning.  She is also on diuretics-torsemide    PT Comments  Patient demonstrates slow labored movement for sitting up at bedside requiring Mod assist for moving RLE due to increasing knee pain, fair/good return for completing BLE ROM/strengthening exercises while seated at EOB and improvement for maintaining NWB on RLE while standing using RW.  Patient able to take a few shuffling side steps before having to sit due to fatigue and right knee pain.  Patient tolerated sitting up in chair after therapy - nursing staff notified.  Patient will benefit from continued skilled physical therapy in hospital and recommended venue below to increase strength, balance, endurance for safe ADLs and gait.        If plan is discharge home, recommend the following: A lot of help with bathing/dressing/bathroom;A lot of help with walking and/or transfers;Help with stairs or ramp for entrance;Assistance with cooking/housework   Can travel by private vehicle     No  Equipment Recommendations  None recommended by PT    Recommendations for Other Services       Precautions / Restrictions Precautions Precautions: Fall Required Braces or Orthoses: Knee Immobilizer - Right Knee  Immobilizer - Right: On when out of bed or walking Restrictions Weight Bearing Restrictions: Yes RLE Weight Bearing: Non weight bearing     Mobility  Bed Mobility Overal bed mobility: Needs Assistance Bed Mobility: Supine to Sit     Supine to sit: Mod assist     General bed mobility comments: increased time, labored movement with increasing right knee pain    Transfers Overall transfer level: Needs assistance Equipment used: Rolling walker (2 wheels) Transfers: Sit to/from Stand, Bed to chair/wheelchair/BSC Sit to Stand: Mod assist   Step pivot transfers: Mod assist       General transfer comment: improvement for keeping hands on RW and maintaining NWB on RLE    Ambulation/Gait Ambulation/Gait assistance: Mod assist, Max assist Gait Distance (Feet): 4 Feet Assistive device: Rolling walker (2 wheels) Gait Pattern/deviations: Decreased step length - right, Decreased step length - left, Decreased stride length, Shuffle Gait velocity: slow     General Gait Details: limited to a few shuffling steps on LLE with fair/good return for keeping RLE off floor, limited mostly due to fatigue and right knee pain   Stairs             Wheelchair Mobility     Tilt Bed    Modified Rankin (Stroke Patients Only)       Balance Overall balance assessment: Needs assistance Sitting-balance support: Feet supported, No upper extremity supported Sitting balance-Leahy Scale: Fair Sitting balance - Comments: fair/good seated at EOB   Standing balance support: Bilateral upper extremity supported, During functional  activity, Reliant on assistive device for balance Standing balance-Leahy Scale: Poor Standing balance comment: fair/poor using RW                            Cognition Arousal: Alert Behavior During Therapy: WFL for tasks assessed/performed Overall Cognitive Status: Within Functional Limits for tasks assessed                                           Exercises General Exercises - Lower Extremity Ankle Circles/Pumps: Seated, AROM, Strengthening, Both, 15 reps Long Arc Quad: AROM, Left, 10 reps, Seated Hip Flexion/Marching: Seated, AROM, Strengthening, Left, 10 reps    General Comments        Pertinent Vitals/Pain Pain Assessment Pain Assessment: Faces Faces Pain Scale: Hurts even more Pain Location: LLE and left flank Pain Descriptors / Indicators: Discomfort, Sore, Grimacing, Guarding Pain Intervention(s): Limited activity within patient's tolerance, Monitored during session, Repositioned    Home Living                          Prior Function            PT Goals (current goals can now be found in the care plan section) Acute Rehab PT Goals Patient Stated Goal: To be able to walk again PT Goal Formulation: With patient Time For Goal Achievement: 07/10/23 Potential to Achieve Goals: Good Progress towards PT goals: Progressing toward goals    Frequency    Min 3X/week      PT Plan      Co-evaluation              AM-PAC PT "6 Clicks" Mobility   Outcome Measure  Help needed turning from your back to your side while in a flat bed without using bedrails?: A Lot Help needed moving from lying on your back to sitting on the side of a flat bed without using bedrails?: A Lot Help needed moving to and from a bed to a chair (including a wheelchair)?: A Lot Help needed standing up from a chair using your arms (e.g., wheelchair or bedside chair)?: A Lot Help needed to walk in hospital room?: A Lot Help needed climbing 3-5 steps with a railing? : Total 6 Click Score: 11    End of Session Equipment Utilized During Treatment: Oxygen Activity Tolerance: Patient tolerated treatment well;Patient limited by fatigue Patient left: in chair;with call bell/phone within reach Nurse Communication: Mobility status PT Visit Diagnosis: Unsteadiness on feet (R26.81);Other abnormalities of gait and  mobility (R26.89);Muscle weakness (generalized) (M62.81) Pain - Right/Left: Right Pain - part of body: Knee     Time: 1136-1201 PT Time Calculation (min) (ACUTE ONLY): 25 min  Charges:    $Therapeutic Exercise: 8-22 mins $Therapeutic Activity: 8-22 mins PT General Charges $$ ACUTE PT VISIT: 1 Visit                    3:00 PM, 06/19/23 Ocie Bob, MPT Physical Therapist with [facility] [phone] office [phone] mobile phone

## 2023-06-19 NOTE — Progress Notes (Signed)
   ORTHOPAEDIC PROGRESS NOTE  S/P: ORIF R tibial plateau fracture   DOS: 06/18/2023  SUBJECTIVE: Patient complaining of pain in her right leg.  Difficult to get comfortable.  No numbness or tingling.  Knee immobilizer is too tight.   OBJECTIVE: PE:  Alert and oriented.  No acute distress  Knee immobilizer fitting well Lower leg without significant swelling.  Compartments are compressible Dressings are clean, dry and intact Toes are warm and well perfused Active motion intact in the EHL/TA Sensation intact to the dorsum of the foot   Vitals:   06/19/23 0007 06/19/23 0424  BP: (!) 117/102 120/73  Pulse: 88 85  Resp: 19 20  Temp: 98.4 F (36.9 C) 98.1 F (36.7 C)  SpO2: 99% 100%     ASSESSMENT: [name] is a 72 y.o. female stable following ORIF of right tibial plateau fracture  PLAN: Weightbearing: NWB RLE Incisional and dressing care: Reinforce as needed Orthopedic device(s):  Knee immobilizer VTE prophylaxis: Per medicine and cardiology; can resume coumadin starting POD1 Pain control: As needed Follow - up plan: approximately 2 weeks  Elevate the right leg to help with swelling Prefer PO main medications as needed PT/OT evaluation    Contact information:     Filippo Puls A. Dallas Schimke, MD MS [facility] [address] [place],  [place]  [address] Phone: [phone] Fax: [phone]

## 2023-06-19 NOTE — Progress Notes (Addendum)
ANTICOAGULATION CONSULT NOTE  Pharmacy Consult for Heparin>>warfarin Indication: atrial fibrillation and aortic valve replacement  Allergies  Allergen Reactions   Penicillin G Itching and Other (See Comments)   Penicillins Hives, Itching, Swelling and Rash    Patient Measurements: Height: 5\' 8"  (172.7 cm) Weight: 71.4 kg (157 lb 6.5 oz) IBW/kg (Calculated) : 63.9 Heparin Dosing Weight: 72.2 kg  Vital Signs: Temp: 98.1 F (36.7 C) (08/30 0424) Temp Source: Oral (08/30 0424) BP: 120/73 (08/30 0424) Pulse Rate: 85 (08/30 0424)  Labs: Recent Labs    06/17/23 0421 06/17/23 1850 06/18/23 0458 06/19/23 0447  HGB 10.0*  --  9.8* 9.3*  HCT 30.8*  --  31.1* 29.3*  PLT 183  --  179 193  LABPROT 22.0*  --  21.0* 19.9*  INR 1.9*  --  1.8* 1.7*  HEPARINUNFRC  --  0.31 0.42 0.28*  CREATININE 1.06*  --   --  1.16*    Estimated Creatinine Clearance: 44.9 mL/min (A) (by C-G formula based on SCr of 1.16 mg/dL (H)).   Medical History: Past Medical History:  Diagnosis Date   Anxiety disorder    Aortic stenosis    Status post St. Jude mechanical AVR 2007   Asthma    Atrial fibrillation (HCC)    Carcinoid tumor of colon 2007   Chronic diastolic heart failure (HCC)    Coronary atherosclerosis of native coronary artery    Status post CABG 2007   GERD (gastroesophageal reflux disease)    History of colonoscopy 2003   Dr. 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 Jena Gauss, benign biopsies   RLS (restless legs syndrome)    Schatzki's ring     Assessment: Pharmacy consulted to dose heparin in patient with atrial fibrillation and mechanical AVR. Patient's warfarin home dose listed as 1 mg on Fridays and 2 mg ROW. Has held warfarin since 8/26 for planned surgery on 8/29.  INR down to 1.7- subtherapeutic as expected. Platelets WNL. Hemoglobin stable 9-10s. Will continue IV heparin, restart warfarin  tonight.    Heparin level now just below goal at 0.28. No bleeding or IV issues noted.   Patient s/p ORIF yesterday 8/29.   Goal of Therapy:  INR 2-3 Monitor platelets by anticoagulation protocol: Yes   Plan:  Increase heparin infusion to 1100 units/hr. Check heparin level with am labs. Restart warfarin tonight - 2mg  tonight  Sheppard Coil PharmD., BCPS Clinical Pharmacist 06/19/2023 8:00 AM

## 2023-06-20 ENCOUNTER — Inpatient Hospital Stay (HOSPITAL_COMMUNITY): Payer: Medicare Other

## 2023-06-20 DIAGNOSIS — E876 Hypokalemia: Secondary | ICD-10-CM | POA: Diagnosis not present

## 2023-06-20 DIAGNOSIS — S2242XA Multiple fractures of ribs, left side, initial encounter for closed fracture: Secondary | ICD-10-CM | POA: Diagnosis not present

## 2023-06-20 DIAGNOSIS — S82121D Displaced fracture of lateral condyle of right tibia, subsequent encounter for closed fracture with routine healing: Secondary | ICD-10-CM | POA: Diagnosis not present

## 2023-06-20 DIAGNOSIS — E039 Hypothyroidism, unspecified: Secondary | ICD-10-CM | POA: Diagnosis not present

## 2023-06-20 LAB — CBC
HCT: 26.7 % — ABNORMAL LOW (ref 36.0–46.0)
Hemoglobin: 8.3 g/dL — ABNORMAL LOW (ref 12.0–15.0)
MCH: 28.9 pg (ref 26.0–34.0)
MCHC: 31.1 g/dL (ref 30.0–36.0)
MCV: 93 fL (ref 80.0–100.0)
Platelets: 183 10*3/uL (ref 150–400)
RBC: 2.87 MIL/uL — ABNORMAL LOW (ref 3.87–5.11)
RDW: 14.8 % (ref 11.5–15.5)
WBC: 7 10*3/uL (ref 4.0–10.5)
nRBC: 0 % (ref 0.0–0.2)

## 2023-06-20 LAB — PROTIME-INR
INR: 1.5 — ABNORMAL HIGH (ref 0.8–1.2)
Prothrombin Time: 18.6 s — ABNORMAL HIGH (ref 11.4–15.2)

## 2023-06-20 LAB — HEPARIN LEVEL (UNFRACTIONATED): Heparin Unfractionated: 0.5 [IU]/mL (ref 0.30–0.70)

## 2023-06-20 MED ORDER — WARFARIN SODIUM 2 MG PO TABS
4.0000 mg | ORAL_TABLET | Freq: Once | ORAL | Status: AC
  Administered 2023-06-20: 4 mg via ORAL
  Filled 2023-06-20: qty 2

## 2023-06-20 MED ORDER — FUROSEMIDE 20 MG PO TABS
20.0000 mg | ORAL_TABLET | Freq: Every day | ORAL | Status: DC
  Administered 2023-06-20 – 2023-06-21 (×2): 20 mg via ORAL
  Filled 2023-06-20 (×3): qty 1

## 2023-06-20 NOTE — Progress Notes (Signed)
See flowsheet for vitals. Patient appears winded but denies SOB Respirations 32 per min O2 desating into 80s pt not keeping O2 on. Some bluish discoloration around lips pale in color.  education provided. Wheezing heard in upper lobes. Increased O2 to 2.5L O2 98%. MD made aware. Chest xray ordered. Pt encouraged to do deep breathing. Respiratory called. RN informed.

## 2023-06-20 NOTE — Progress Notes (Signed)
ANTICOAGULATION CONSULT NOTE  Pharmacy Consult for Heparin>>warfarin Indication: atrial fibrillation and aortic valve replacement  Allergies  Allergen Reactions   Penicillin G Itching and Other (See Comments)   Penicillins Hives, Itching, Swelling and Rash    Patient Measurements: Height: 5\' 8"  (172.7 cm) Weight: 69.9 kg (154 lb 1.6 oz) IBW/kg (Calculated) : 63.9 Heparin Dosing Weight: 72.2 kg  Vital Signs: Temp: 97.7 F (36.5 C) (08/31 0530) Temp Source: Oral (08/31 0530) BP: 118/80 (08/31 0530) Pulse Rate: 92 (08/31 0530)  Labs: Recent Labs    06/18/23 0458 06/19/23 0447 06/20/23 0508  HGB 9.8* 9.3* 8.3*  HCT 31.1* 29.3* 26.7*  PLT 179 193 183  LABPROT 21.0* 19.9* 18.6*  INR 1.8* 1.7* 1.5*  HEPARINUNFRC 0.42 0.28* 0.50  CREATININE  --  1.16*  --     Estimated Creatinine Clearance: 44.9 mL/min (A) (by C-G formula based on SCr of 1.16 mg/dL (H)).   Medical History: Past Medical History:  Diagnosis Date   Anxiety disorder    Aortic stenosis    Status post St. Jude mechanical AVR 2007   Asthma    Atrial fibrillation (HCC)    Carcinoid tumor of colon 2007   Chronic diastolic heart failure (HCC)    Coronary atherosclerosis of native coronary artery    Status post CABG 2007   GERD (gastroesophageal reflux disease)    History of colonoscopy 2003   Dr. 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 Jena Gauss, benign biopsies   RLS (restless legs syndrome)    Schatzki's ring     Assessment: Pharmacy consulted to dose heparin in patient with atrial fibrillation and mechanical AVR. Patient's warfarin home dose listed as 1 mg on Fridays and 2 mg ROW. Has held warfarin since 8/26 for planned surgery on 8/29.  INR down to 1.5- subtherapeutic after warfarin restarted last night. Platelets WNL. Hemoglobin down 8.3. Will continue IV heparin and warfarin tonight.    Heparin level now at goal  at 0.5. No bleeding or IV issues noted.   Patient s/p ORIF yesterday 8/29.   Goal of Therapy:  INR 2-3 Monitor platelets by anticoagulation protocol: Yes   Plan:  Decrease heparin infusion to 1050 units/hr Check heparin level with am labs. Warfarin tonight - 4mg  tonight  Sheppard Coil PharmD., BCPS Clinical Pharmacist 06/20/2023 7:43 AM

## 2023-06-20 NOTE — Progress Notes (Signed)
[facility]  PROGRESS NOTE  MARJANI PISTORIUS PQZ:[id number] DOB: 05/11/1951 DOA: 06/11/2023 PCP: Rebekah Chesterfield, NP   Brief HPI:   72 year old female with PMH of chronic diastolic CHF, chronic respiratory failure on home oxygen 2 L/min, atrial fibrillation and mechanical AVR on warfarin anticoagulation, COPD, presented to the ED on 8/22 following a fall with left-sided chest and right knee pain. Admitted for nondisplaced left anterior 4-7 rib fractures, comminuted displaced fracture involving the right lateral tibial plateau. Orthopedics consulted and plan ORIF on 06/18/2023. Cardiology consulted and provided preop clearance.     Assessment/Plan:   Right lateral tibial plateau fracture Orthopedic consultation obtained, patient underwent fixation of right lateral tibial plateau fracture -Started back on heparin and warfarin Cardiology consulted, input appreciated, patient at increased risk but not prohibitive risk for surgery and recommended proceeding with surgery with no further cardiac testing or intervention. Nonweightbearing right lower extremity in knee immobilizer. -Follow-up orthopedics in 2 weeks    Hyponatremia -Sodium improved to 131 -Osmolality 288 -Urine osmolality and urine sodium pending -Continue fluid restriction   Acute blood loss anemia May be related to the tibial fracture Hemoglobin dropped from 12.1 on 8/22-9.4.   Follow CBC  and transfuse if hemoglobin 7 g or less   Thrombocytopenia Again could be due to acute blood loss. Resolved   Nondisplaced left anterior 4-7 rib fractures/left chest wall pain Optimize nonopioid pain regimen, incentive spirometry encouraged.    Hypothyroidism -Continue Synthroid.   Hypokalemia Replaced.   Magnesium 2.2.   Benign essential HTN -Blood pressure is labile between soft and normal -Monitor closely. -Holding antihypertensive agents at the moment.   Chronic diastolic CHF (congestive heart failure)  (HCC) -Stable and compensated despite holding of diuretics.  Last echo 08/2022 EF of 55 to 60%. -Holding torsemide and beta-blocker in the setting of soft blood pressure -Continue to follow daily weights and strict I's and O's.   CKD (chronic kidney disease) stage 3b, GFR 30-59 ml/min (HCC) -Creatinine stable in the 1.4-1.5 range -Monitor closely.  Avoid hypotension or nephrotoxic's. -Creatinine has actually improved to 1.11.   Chronic anticoagulation for permanent A-fib and mechanical AVR As per cardiology office note on 03/30/2023, history of aortic stenosis, s/p St Jude mechanical AVR in 2007 along with graft repair of AAA. On warfarin for atrial fibrillation. -Continue holding bisoprolol and diltiazem in the setting of soft blood pressure. -Started back on heparin and warfarin per pharmacy    H/O heart valve replacement with mechanical aortic valve -INR range 2-3 when back on Coumadin -Anticoagulation discussion above   Atrial fibrillation, chronic (HCC) -Heart rate went to 110 yesterday -Started back on Cardizem 30 mg p.o. every 6 hours, low-dose metoprolol 12.5 mg p.o. twice daily -Rate controlled -Continue telemetry monitoring.   GERD Continue PPI   Hyperlipidemia Patient/family unsure of dose of Crestor. Resumed lower-dose Crestor 10 Mg daily.    Medications     acetaminophen  1,000 mg Oral QID   diltiazem  30 mg Oral Q6H   levothyroxine  50 mcg Oral Q0600   lumateperone tosylate  42 mg Oral Daily   metoprolol tartrate  12.5 mg Oral BID   pantoprazole  40 mg Oral Daily   rOPINIRole  3 mg Oral BID   rosuvastatin  10 mg Oral Daily   warfarin  4 mg Oral ONCE-1600   Warfarin - Pharmacist Dosing Inpatient   Does not apply q1600     Data Reviewed:   CBG:  No results for input(s): "GLUCAP"  in the last 168 hours.  SpO2: 98 % O2 Flow Rate (L/min): 2 L/min    Vitals:   06/19/23 1947 06/19/23 2348 06/20/23 0500 06/20/23 0530  BP: 120/73 104/71  118/80   Pulse: (!) 105 85  92  Resp: 20     Temp: (!) 97.5 F (36.4 C)   97.7 F (36.5 C)  TempSrc: Oral   Oral  SpO2: 99% 97%  98%  Weight:   69.9 kg   Height:          Data Reviewed:  Basic Metabolic Panel: Recent Labs  Lab 06/16/23 0410 06/17/23 0421 06/19/23 0447  NA 130* 128* 131*  K 4.2 4.1 3.9  CL 97* 96* 98  CO2 27 24 27   GLUCOSE 82 85 99  BUN 13 14 12   CREATININE 1.11* 1.06* 1.16*  CALCIUM 8.5* 8.4* 8.4*    CBC: Recent Labs  Lab 06/16/23 0410 06/17/23 0421 06/18/23 0458 06/19/23 0447 06/20/23 0508  WBC 4.4 5.5 4.5 8.0 7.0  HGB 9.6* 10.0* 9.8* 9.3* 8.3*  HCT 30.3* 30.8* 31.1* 29.3* 26.7*  MCV 89.9 89.3 90.1 91.0 93.0  PLT 169 183 179 193 183    LFT No results for input(s): "AST", "ALT", "ALKPHOS", "BILITOT", "PROT", "ALBUMIN" in the last 168 hours.    Antibiotics: Anti-infectives (From admission, onward)    Start     Dose/Rate Route Frequency Ordered Stop   06/18/23 2000  ceFAZolin (ANCEF) IVPB 2g/100 mL premix        2 g 200 mL/hr over 30 Minutes Intravenous Every 8 hours 06/18/23 1626 06/19/23 1315   06/18/23 1152  ceFAZolin (ANCEF) 2-4 GM/100ML-% IVPB       Note to Pharmacy: Laureen Ochs R: cabinet override      06/18/23 1152 06/18/23 1241   06/18/23 0600  ceFAZolin (ANCEF) IVPB 2g/100 mL premix        2 g 200 mL/hr over 30 Minutes Intravenous On call to O.R. 06/16/23 2153 06/18/23 1246        DVT prophylaxis: On IV heparin  Code Status: Full code  Family Communication:    CONSULTS Orthopedics   Subjective   Right leg pain is well-controlled   Objective    Physical Examination:   General-appears in no acute distress Heart-S1-S2, regular, no murmur auscultated Lungs-clear to auscultation bilaterally, no wheezing or crackles auscultated Abdomen-soft, nontender, no organomegaly Extremities-right lower extremity in knee immobilizer Neuro-alert, oriented x3, no focal deficit noted  Status is: Inpatient:           Meredeth Ide   [facility] If 7PM-7AM, please contact night-coverage at [email or web address], Office  [phone]   06/20/2023, 8:50 AM  LOS: 9 days

## 2023-06-21 DIAGNOSIS — S82121D Displaced fracture of lateral condyle of right tibia, subsequent encounter for closed fracture with routine healing: Secondary | ICD-10-CM | POA: Diagnosis not present

## 2023-06-21 DIAGNOSIS — E876 Hypokalemia: Secondary | ICD-10-CM | POA: Diagnosis not present

## 2023-06-21 DIAGNOSIS — S2242XA Multiple fractures of ribs, left side, initial encounter for closed fracture: Secondary | ICD-10-CM | POA: Diagnosis not present

## 2023-06-21 DIAGNOSIS — E039 Hypothyroidism, unspecified: Secondary | ICD-10-CM | POA: Diagnosis not present

## 2023-06-21 LAB — BASIC METABOLIC PANEL
Anion gap: 6 (ref 5–15)
BUN: 12 mg/dL (ref 8–23)
CO2: 27 mmol/L (ref 22–32)
Calcium: 8.1 mg/dL — ABNORMAL LOW (ref 8.9–10.3)
Chloride: 95 mmol/L — ABNORMAL LOW (ref 98–111)
Creatinine, Ser: 0.99 mg/dL (ref 0.44–1.00)
GFR, Estimated: >60 mL/min (ref >60)
Glucose, Bld: 130 mg/dL — ABNORMAL HIGH (ref 70–99)
Potassium: 5.3 mmol/L — ABNORMAL HIGH (ref 3.5–5.1)
Sodium: 128 mmol/L — ABNORMAL LOW (ref 135–145)

## 2023-06-21 LAB — CBC
HCT: 24.5 % — ABNORMAL LOW (ref 36.0–46.0)
Hemoglobin: 7.5 g/dL — ABNORMAL LOW (ref 12.0–15.0)
MCH: 28.4 pg (ref 26.0–34.0)
MCHC: 30.6 g/dL (ref 30.0–36.0)
MCV: 92.8 fL (ref 80.0–100.0)
Platelets: 164 10*3/uL (ref 150–400)
RBC: 2.64 MIL/uL — ABNORMAL LOW (ref 3.87–5.11)
RDW: 15 % (ref 11.5–15.5)
WBC: 6.7 10*3/uL (ref 4.0–10.5)
nRBC: 0 % (ref 0.0–0.2)

## 2023-06-21 LAB — PROTIME-INR
INR: 1.6 — ABNORMAL HIGH (ref 0.8–1.2)
Prothrombin Time: 19.1 s — ABNORMAL HIGH (ref 11.4–15.2)

## 2023-06-21 LAB — HEPARIN LEVEL (UNFRACTIONATED)
Heparin Unfractionated: <0.1 [IU]/mL — ABNORMAL LOW (ref 0.30–0.70)
Heparin Unfractionated: 0.2 [IU]/mL — ABNORMAL LOW (ref 0.30–0.70)

## 2023-06-21 LAB — OSMOLALITY, URINE: Osmolality, Ur: 114 mosm/kg — ABNORMAL LOW (ref 300–900)

## 2023-06-21 LAB — SODIUM, URINE, RANDOM: Sodium, Ur: <10 mmol/L

## 2023-06-21 MED ORDER — WARFARIN SODIUM 2 MG PO TABS
4.0000 mg | ORAL_TABLET | Freq: Once | ORAL | Status: AC
  Administered 2023-06-21: 4 mg via ORAL
  Filled 2023-06-21: qty 2

## 2023-06-21 MED ORDER — WARFARIN SODIUM 5 MG PO TABS: 5.0000 mg | ORAL_TABLET | Freq: Once | ORAL | Status: DC

## 2023-06-21 MED ORDER — FUROSEMIDE 20 MG PO TABS
20.0000 mg | ORAL_TABLET | Freq: Two times a day (BID) | ORAL | Status: DC
  Administered 2023-06-21 – 2023-06-24 (×6): 20 mg via ORAL
  Filled 2023-06-21 (×6): qty 1

## 2023-06-21 NOTE — Progress Notes (Addendum)
Patient coughed up mucous which has some blood in it. MD [place] notified.  MD [place] states to monitor

## 2023-06-21 NOTE — TOC Progression Note (Addendum)
Transition of Care [facility]) - Progression Note    Patient Details  Name: [name] MRN: [id number] Date of Birth: [birth year]  Transition of Care [facility]) CM/SW Contact  Catalina Gravel, LCSW Phone Number: 06/21/2023, 1:54 PM  Clinical Narrative:    CSW verified that [place] approval arrived. Berkley Harvey #[id number] approval 8/30-9/3.TOC to follow.  Addendum: CSW followed up with MD via secure chat, stated pt likely available Tuesday for DC.  Expected Discharge Plan: Home/Self Care Barriers to Discharge: Continued Medical Work up  Expected Discharge Plan and Services       Living arrangements for the past 2 months: Single Family Home                                       Social Determinants of Health (SDOH) Interventions SDOH Screenings   Food Insecurity: No Food Insecurity (06/11/2023)  Housing: Low Risk  (06/11/2023)  Transportation Needs: No Transportation Needs (06/11/2023)  Utilities: Not At Risk (06/11/2023)  Financial Resource Strain: Low Risk  (07/28/2018)  Physical Activity: Inactive (07/28/2018)  Social Connections: Somewhat Isolated (07/28/2018)  Stress: No Stress Concern Present (07/28/2018)  Tobacco Use: Medium Risk (06/18/2023)    Readmission Risk Interventions     No data to display

## 2023-06-21 NOTE — Progress Notes (Signed)
Pt called out to desk around 0030 complaining of her leg itching. NT to room and notice bleeding from leg and made this nurse aware. This nurse in room to look at R leg. Bright red blood noted on knee immobilizer, ace wrap & gauze wrap. Charge RN, [facility] & MD notified. MD ordered site be redressed & gave verbal to hold heparin gtt for tonight. Leg has been rewrapped. Informed patient on MD's updated orders. Adv pt that we will monitor leg throughout night. Pt verbalized understanding.

## 2023-06-21 NOTE — Progress Notes (Signed)
[facility]  PROGRESS NOTE  [name]:[id number] DOB: 1951-05-15 DOA: 06/11/2023 PCP: Rebekah Chesterfield, NP   Brief HPI:   72 year old female with PMH of chronic diastolic CHF, chronic respiratory failure on home oxygen 2 L/min, atrial fibrillation and mechanical AVR on warfarin anticoagulation, COPD, presented to the ED on 8/22 following a fall with left-sided chest and right knee pain. Admitted for nondisplaced left anterior 4-7 rib fractures, comminuted displaced fracture involving the right lateral tibial plateau. Orthopedics consulted and plan ORIF on 06/18/2023. Cardiology consulted and provided preop clearance.     Assessment/Plan:   Right lateral tibial plateau fracture, s/p ORIF Orthopedic consultation obtained, patient underwent fixation of right lateral tibial plateau fracture -Heparin was stopped yesterday as patient had bleeding from the surgical wound -Bleeding has now stopped, orthopedics recommend to start back on heparin and warfarin -Started back on heparin and warfarin Cardiology consulted, input appreciated, patient at increased risk but not prohibitive risk for surgery and recommended proceeding with surgery with no further cardiac testing or intervention. Nonweightbearing right lower extremity in knee immobilizer. -Follow-up orthopedics in 2 weeks    Hyponatremia -Sodium improved to 131 -Osmolality 288 -Urine osmolality and urine sodium pending -Continue fluid restriction   Acute blood loss anemia -Status post bleeding last night from surgical wound, stopped at this time. Hemoglobin dropped from 12.1 on 8/22  -7.5 Follow CBC  and transfuse if hemoglobin 7 g or less   Thrombocytopenia Again could be due to acute blood loss. Resolved   Nondisplaced left anterior 4-7 rib fractures/left chest wall pain Optimize nonopioid pain regimen, incentive spirometry encouraged.    Hypothyroidism -Continue Synthroid.   Hypokalemia Replaced.    Magnesium 2.2.   Benign essential HTN -Blood pressure is labile between soft and normal -Monitor closely. -Holding antihypertensive agents at the moment.   Chronic diastolic CHF (congestive heart failure) (HCC) -Developed worsening shortness of breath, chest x-ray showed cardiomegaly -Started back on Lasix at low-dose of 20 mg twice daily -  Last echo 08/2022 EF of 55 to 60%. -Holding torsemide and beta-blocker in the setting of soft blood pressure -Continue to follow daily weights and strict I's and O's.   CKD (chronic kidney disease) stage 3b, GFR 30-59 ml/min (HCC) -Creatinine stable in the 1.4-1.5 range -Monitor closely.  Avoid hypotension or nephrotoxic's. -Creatinine has actually improved to 1.11.   Chronic anticoagulation for permanent A-fib and mechanical AVR As per cardiology office note on 03/30/2023, history of aortic stenosis, s/p St Jude mechanical AVR in 2007 along with graft repair of AAA. On warfarin for atrial fibrillation. -Continue holding bisoprolol and diltiazem in the setting of soft blood pressure. -Started back on heparin and warfarin per pharmacy    H/O heart valve replacement with mechanical aortic valve -INR range 2-3 when back on Coumadin -Anticoagulation discussion above   Atrial fibrillation, chronic (HCC) -Heart rate went to 110 yesterday -Started back on Cardizem 30 mg p.o. every 6 hours, low-dose metoprolol 12.5 mg p.o. twice daily -Rate controlled -Continue telemetry monitoring.   GERD Continue PPI   Hyperlipidemia Patient/family unsure of dose of Crestor. Resumed lower-dose Crestor 10 Mg daily.    Medications     acetaminophen  1,000 mg Oral QID   diltiazem  30 mg Oral Q6H   furosemide  20 mg Oral Daily   levothyroxine  50 mcg Oral Q0600   lumateperone tosylate  42 mg Oral Daily   metoprolol tartrate  12.5 mg Oral BID   pantoprazole  40 mg  Oral Daily   rOPINIRole  3 mg Oral BID   rosuvastatin  10 mg Oral Daily   warfarin  4  mg Oral ONCE-1600   Warfarin - Pharmacist Dosing Inpatient   Does not apply q1600     Data Reviewed:   CBG:  No results for input(s): "GLUCAP" in the last 168 hours.  SpO2: 100 % O2 Flow Rate (L/min): 2.5 L/min    Vitals:   06/20/23 2059 06/21/23 0019 06/21/23 0449 06/21/23 0500  BP: 116/67 135/68 104/64   Pulse: 85 95 90   Resp:      Temp: 97.8 F (36.6 C)  98 F (36.7 C)   TempSrc: Axillary     SpO2: 100% 100% 100%   Weight:    71.3 kg  Height:          Data Reviewed:  Basic Metabolic Panel: Recent Labs  Lab 06/16/23 0410 06/17/23 0421 06/19/23 0447  NA 130* 128* 131*  K 4.2 4.1 3.9  CL 97* 96* 98  CO2 27 24 27   GLUCOSE 82 85 99  BUN 13 14 12   CREATININE 1.11* 1.06* 1.16*  CALCIUM 8.5* 8.4* 8.4*    CBC: Recent Labs  Lab 06/17/23 0421 06/18/23 0458 06/19/23 0447 06/20/23 0508 06/21/23 0444  WBC 5.5 4.5 8.0 7.0 6.7  HGB 10.0* 9.8* 9.3* 8.3* 7.5*  HCT 30.8* 31.1* 29.3* 26.7* 24.5*  MCV 89.3 90.1 91.0 93.0 92.8  PLT 183 179 193 183 164    LFT No results for input(s): "AST", "ALT", "ALKPHOS", "BILITOT", "PROT", "ALBUMIN" in the last 168 hours.    Antibiotics: Anti-infectives (From admission, onward)    Start     Dose/Rate Route Frequency Ordered Stop   06/18/23 2000  ceFAZolin (ANCEF) IVPB 2g/100 mL premix        2 g 200 mL/hr over 30 Minutes Intravenous Every 8 hours 06/18/23 1626 06/19/23 1315   06/18/23 1152  ceFAZolin (ANCEF) 2-4 GM/100ML-% IVPB       Note to Pharmacy: Laureen Ochs R: cabinet override      06/18/23 1152 06/18/23 1241   06/18/23 0600  ceFAZolin (ANCEF) IVPB 2g/100 mL premix        2 g 200 mL/hr over 30 Minutes Intravenous On call to O.R. 06/16/23 2153 06/18/23 1246        DVT prophylaxis: On IV heparin  Code Status: Full code  Family Communication:    CONSULTS Orthopedics   Subjective   Developed bleeding from surgical wound last night, dressing was changed.  Bleeding has stopped.   Objective     Physical Examination:  General-appears in no acute distress Heart-S1-S2, regular, no murmur auscultated Lungs-clear to auscultation bilaterally, no wheezing or crackles auscultated Abdomen-soft, nontender, no organomegaly Extremities-right lower extremity in dressing Neuro-alert, oriented x3, no focal deficit noted   Status is: Inpatient:          Meredeth Ide   [facility] If 7PM-7AM, please contact night-coverage at [email or web address], Office  [phone]   06/21/2023, 8:52 AM  LOS: 10 days

## 2023-06-21 NOTE — Progress Notes (Addendum)
ANTICOAGULATION CONSULT NOTE  Pharmacy Consult for Heparin>>warfarin Indication: atrial fibrillation and aortic valve replacement  Allergies  Allergen Reactions   Penicillin G Itching and Other (See Comments)   Penicillins Hives, Itching, Swelling and Rash    Patient Measurements: Height: 5\' 8"  (172.7 cm) Weight: 71.3 kg (157 lb 3 oz) IBW/kg (Calculated) : 63.9 Heparin Dosing Weight: 72.2 kg  Vital Signs: Temp: 98 F (36.7 C) (09/01 0449) Temp Source: Axillary (08/31 2059) BP: 104/64 (09/01 0449) Pulse Rate: 90 (09/01 0449)  Labs: Recent Labs    06/19/23 0447 06/20/23 0508 06/21/23 0444  HGB 9.3* 8.3* 7.5*  HCT 29.3* 26.7* 24.5*  PLT 193 183 164  LABPROT 19.9* 18.6* 19.1*  INR 1.7* 1.5* 1.6*  HEPARINUNFRC 0.28* 0.50 <0.10*  CREATININE 1.16*  --   --     Estimated Creatinine Clearance: 44.9 mL/min (A) (by C-G formula based on SCr of 1.16 mg/dL (H)).   Medical History: Past Medical History:  Diagnosis Date   Anxiety disorder    Aortic stenosis    Status post St. Jude mechanical AVR 2007   Asthma    Atrial fibrillation (HCC)    Carcinoid tumor of colon 2007   Chronic diastolic heart failure (HCC)    Coronary atherosclerosis of native coronary artery    Status post CABG 2007   GERD (gastroesophageal reflux disease)    History of colonoscopy 2003   Dr. 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 Jena Gauss, benign biopsies   RLS (restless legs syndrome)    Schatzki's ring     Assessment: Pharmacy consulted to dose heparin in patient with atrial fibrillation and mechanical AVR. Patient's warfarin home dose listed as 1 mg on Fridays and 2 mg ROW.   Patient s/p ORIF 8/29.   INR up to 1.6- subtherapeutic after warfarin restarted 8/30 post op. Hemoglobin down again to  7.5. Leg bleeding noted overnight and heparin was paused. Later this morning ortho has approved for heparin to be  restarted.    Goal of Therapy:  INR 2-3 Monitor platelets by anticoagulation protocol: Yes   Plan:  Heparin restarted at 1050 units/hr Will follow closely for any further signs or symptoms of bleeding Check heparin level with am labs. Warfarin tonight - 4mg  tonight  Addendum:  Follow up heparin level tonight was slightly below goal at 0.2. Given that patient recently coughed up mucus with some blood in it and recent leg bleeding will leave rate at 1050 units/hr and recheck with am labs.    Sheppard Coil PharmD., BCPS Clinical Pharmacist 06/21/2023 7:52 AM

## 2023-06-21 NOTE — Progress Notes (Signed)
MD [place] Schimke and MD [place] made aware that patient is no longer on heparin drip because of the incident on night shift regarding her leg bleeding. Patient does not currently have any evidence of blood showing through her brace. MD Dallas Schimke verbally ordered to restart heparin and resume coumadin as ordered.

## 2023-06-22 DIAGNOSIS — E039 Hypothyroidism, unspecified: Secondary | ICD-10-CM | POA: Diagnosis not present

## 2023-06-22 DIAGNOSIS — S2242XA Multiple fractures of ribs, left side, initial encounter for closed fracture: Secondary | ICD-10-CM | POA: Diagnosis not present

## 2023-06-22 DIAGNOSIS — S82121D Displaced fracture of lateral condyle of right tibia, subsequent encounter for closed fracture with routine healing: Secondary | ICD-10-CM | POA: Diagnosis not present

## 2023-06-22 DIAGNOSIS — I482 Chronic atrial fibrillation, unspecified: Secondary | ICD-10-CM | POA: Diagnosis not present

## 2023-06-22 LAB — CBC
HCT: 23.5 % — ABNORMAL LOW (ref 36.0–46.0)
Hemoglobin: 7.2 g/dL — ABNORMAL LOW (ref 12.0–15.0)
MCH: 28.9 pg (ref 26.0–34.0)
MCHC: 30.6 g/dL (ref 30.0–36.0)
MCV: 94.4 fL (ref 80.0–100.0)
Platelets: 175 10*3/uL (ref 150–400)
RBC: 2.49 MIL/uL — ABNORMAL LOW (ref 3.87–5.11)
RDW: 15.3 % (ref 11.5–15.5)
WBC: 5.4 10*3/uL (ref 4.0–10.5)
nRBC: 0 % (ref 0.0–0.2)

## 2023-06-22 LAB — PROTIME-INR
INR: 1.8 — ABNORMAL HIGH (ref 0.8–1.2)
Prothrombin Time: 21 s — ABNORMAL HIGH (ref 11.4–15.2)

## 2023-06-22 LAB — HEPARIN LEVEL (UNFRACTIONATED): Heparin Unfractionated: 0.32 [IU]/mL (ref 0.30–0.70)

## 2023-06-22 MED ORDER — WARFARIN SODIUM 2 MG PO TABS
4.0000 mg | ORAL_TABLET | Freq: Once | ORAL | Status: AC
  Administered 2023-06-22: 4 mg via ORAL
  Filled 2023-06-22: qty 2

## 2023-06-22 NOTE — Progress Notes (Addendum)
ANTICOAGULATION CONSULT NOTE  Pharmacy Consult for Heparin>>warfarin Indication: atrial fibrillation and aortic valve replacement  Allergies  Allergen Reactions   Penicillin G Itching and Other (See Comments)   Penicillins Hives, Itching, Swelling and Rash    Patient Measurements: Height: 5\' 8"  (172.7 cm) Weight: 74.7 kg (164 lb 10.9 oz) IBW/kg (Calculated) : 63.9 Heparin Dosing Weight: 72.2 kg  Vital Signs: Temp: 97.6 F (36.4 C) (09/02 0346) Temp Source: Oral (09/02 0346) BP: 100/64 (09/02 0346) Pulse Rate: 64 (09/02 0346)  Labs: Recent Labs    06/20/23 0508 06/21/23 0444 06/21/23 0906 06/21/23 1753 06/22/23 0515  HGB 8.3* 7.5*  --   --  7.2*  HCT 26.7* 24.5*  --   --  23.5*  PLT 183 164  --   --  175  LABPROT 18.6* 19.1*  --   --  21.0*  INR 1.5* 1.6*  --   --  1.8*  HEPARINUNFRC 0.50 <0.10*  --  0.20* 0.32  CREATININE  --   --  0.99  --   --     Estimated Creatinine Clearance: 52.6 mL/min (by C-G formula based on SCr of 0.99 mg/dL).   Medical History: Past Medical History:  Diagnosis Date   Anxiety disorder    Aortic stenosis    Status post St. Jude mechanical AVR 2007   Asthma    Atrial fibrillation (HCC)    Carcinoid tumor of colon 2007   Chronic diastolic heart failure (HCC)    Coronary atherosclerosis of native coronary artery    Status post CABG 2007   GERD (gastroesophageal reflux disease)    History of colonoscopy 2003   Dr. 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 Jena Gauss, benign biopsies   RLS (restless legs syndrome)    Schatzki's ring     Assessment: Pharmacy consulted to dose heparin in patient with atrial fibrillation and mechanical AVR. Patient's warfarin home dose listed as 1 mg on Fridays and 2 mg ROW.   Patient s/p ORIF 8/29.   INR up to 1.8- subtherapeutic. Warfarin restarted 8/30 post op. Hemoglobin down again to  7.2. Leg bleeding noted  overnight on 9/1 appeared resolved by the afternoon. Yesterday evening nursing noted some blood tinged sputum after coughing. Bandage saturated with blood overnight and heparin held again.  Heparin level at low end of goal but in light of recent surgery, ongoing bleeding, decreasing hemoglobin, and INR trend will stop heparin for now and allow INR to trend up to goal (likely later today or in am.    Goal of Therapy:  INR 2-3 Monitor platelets by anticoagulation protocol: Yes   Plan:  Stop heparin  Will follow closely for any further signs or symptoms of bleeding Warfarin tonight - 4mg  again tonight, expect to be at goal in am  Sheppard Coil PharmD., BCPS Clinical Pharmacist 06/22/2023 7:52 AM

## 2023-06-22 NOTE — Progress Notes (Signed)
Assumed care of pt from off going RN. No changes in initial am assessment.Cont with plan of care, Family at bedside

## 2023-06-22 NOTE — Care Management Important Message (Signed)
Important Message  Patient Details  Name: [name] MRN: [id number] Date of Birth: [birth year]   Medicare Important Message Given:  Yes     Corey Harold 06/22/2023, 11:23 AM

## 2023-06-22 NOTE — Progress Notes (Signed)
Pts incision has been steadily bleeding since the beginning of this nurse shift and has saturated dressing, brace and a pillow. Dressing was removed and site was bloody and blisters filled with fluid has been noted around the incision. Site is painful for pt and Mds have been notified. MD advised to apply pressure dressing at this time.

## 2023-06-22 NOTE — Plan of Care (Signed)
Cont with plan of care

## 2023-06-22 NOTE — Progress Notes (Signed)
[facility]  PROGRESS NOTE  [name]:[id number] DOB: July 29, 1951 DOA: 06/11/2023 PCP: Rebekah Chesterfield, NP   Brief HPI:   72 year old female with PMH of chronic diastolic CHF, chronic respiratory failure on home oxygen 2 L/min, atrial fibrillation and mechanical AVR on warfarin anticoagulation, COPD, presented to the ED on 8/22 following a fall with left-sided chest and right knee pain. Admitted for nondisplaced left anterior 4-7 rib fractures, comminuted displaced fracture involving the right lateral tibial plateau. Orthopedics consulted and plan ORIF on 06/18/2023. Cardiology consulted and provided preop clearance.     Assessment/Plan:   Right lateral tibial plateau fracture, s/p ORIF Orthopedic consultation obtained, patient underwent fixation of right lateral tibial plateau fracture -Heparin was stopped yesterday as patient had bleeding from the surgical wound -Bleeding has now stopped, orthopedics recommend to start back on heparin and warfarin -Started back on heparin and warfarin Cardiology consulted, input appreciated, patient at increased risk but not prohibitive risk for surgery and recommended proceeding with surgery with no further cardiac testing or intervention. Nonweightbearing right lower extremity in knee immobilizer. -Follow-up orthopedics in 2 weeks   Hyponatremia -Sodium is again down to 128 -Osmolality 288 -Urine osmolality 110 and urine sodium pending less than 10 -Continue fluid restriction   Acute blood loss anemia -Status post bleeding last night from surgical wound, stopped at this time. Hemoglobin dropped from 12.1 on 8/22  -7.2 Follow CBC  and transfuse if hemoglobin 7 g or less   Thrombocytopenia Again could be due to acute blood loss. Resolved   Nondisplaced left anterior 4-7 rib fractures/left chest wall pain Optimize nonopioid pain regimen, incentive spirometry encouraged.   Hypothyroidism -Continue Synthroid.    Hypokalemia Replaced.   Magnesium 2.2.   Benign essential HTN -Blood pressure is labile between soft and normal -Monitor closely. -Holding antihypertensive agents at the moment.   Chronic diastolic CHF (congestive heart failure) (HCC) -Developed worsening shortness of breath, chest x-ray showed cardiomegaly -Significantly improved after diuresis -Continue  Lasix at low-dose of 20 mg twice daily; diuresing well -  Last echo 08/2022 EF of 55 to 60%. -Holding torsemide and beta-blocker in the setting of soft blood pressure -Continue to follow daily weights and strict I's and O's.   CKD (chronic kidney disease) stage 3b, GFR 30-59 ml/min (HCC) -Creatinine stable in the 1.4-1.5 range -Monitor closely.  Avoid hypotension or nephrotoxic's. -Creatinine has actually improved to 1.11.   Chronic anticoagulation for permanent A-fib and mechanical AVR As per cardiology office note on 03/30/2023, history of aortic stenosis, s/p St Jude mechanical AVR in 2007 along with graft repair of AAA. On warfarin for atrial fibrillation. -Continue holding bisoprolol and diltiazem in the setting of soft blood pressure. -Started back on heparin and warfarin per pharmacy; however heparin has been stopped due to recurrent bleeding from surgical wound. -INR is 1.8, continue Coumadin per pharmacy    H/O heart valve replacement with mechanical aortic valve -Coumadin has been restarted as above    Atrial fibrillation, chronic (HCC) -Heart rate went to 110  -Started back on Cardizem 30 mg p.o. every 6 hours, low-dose metoprolol 12.5 mg p.o. twice daily -Rate controlled -Continue telemetry monitoring.   GERD Continue PPI   Hyperlipidemia Patient/family unsure of dose of Crestor. Resumed lower-dose Crestor 10 Mg daily.  Hyperkalemia -Mild -Patient on Lasix, follow- BMP in am  Medications     acetaminophen  1,000 mg Oral QID   diltiazem  30 mg Oral Q6H   furosemide  20 mg Oral  BID   levothyroxine   50 mcg Oral Q0600   lumateperone tosylate  42 mg Oral Daily   metoprolol tartrate  12.5 mg Oral BID   pantoprazole  40 mg Oral Daily   rOPINIRole  3 mg Oral BID   rosuvastatin  10 mg Oral Daily   warfarin  4 mg Oral ONCE-1600   Warfarin - Pharmacist Dosing Inpatient   Does not apply q1600     Data Reviewed:   CBG:  No results for input(s): "GLUCAP" in the last 168 hours.  SpO2: 97 % O2 Flow Rate (L/min): 2 L/min    Vitals:   06/21/23 1357 06/21/23 1657 06/21/23 2131 06/22/23 0346  BP: 118/74 106/65 111/65 100/64  Pulse: 81  91 64  Resp:      Temp: 98.2 F (36.8 C)  (!) 97.3 F (36.3 C) 97.6 F (36.4 C)  TempSrc: Oral   Oral  SpO2: 99%  100% 97%  Weight:    74.7 kg  Height:          Data Reviewed:  Basic Metabolic Panel: Recent Labs  Lab 06/16/23 0410 06/17/23 0421 06/19/23 0447 06/21/23 0906  NA 130* 128* 131* 128*  K 4.2 4.1 3.9 5.3*  CL 97* 96* 98 95*  CO2 27 24 27 27   GLUCOSE 82 85 99 130*  BUN 13 14 12 12   CREATININE 1.11* 1.06* 1.16* 0.99  CALCIUM 8.5* 8.4* 8.4* 8.1*    CBC: Recent Labs  Lab 06/18/23 0458 06/19/23 0447 06/20/23 0508 06/21/23 0444 06/22/23 0515  WBC 4.5 8.0 7.0 6.7 5.4  HGB 9.8* 9.3* 8.3* 7.5* 7.2*  HCT 31.1* 29.3* 26.7* 24.5* 23.5*  MCV 90.1 91.0 93.0 92.8 94.4  PLT 179 193 183 164 175    LFT No results for input(s): "AST", "ALT", "ALKPHOS", "BILITOT", "PROT", "ALBUMIN" in the last 168 hours.    Antibiotics: Anti-infectives (From admission, onward)    Start     Dose/Rate Route Frequency Ordered Stop   06/18/23 2000  ceFAZolin (ANCEF) IVPB 2g/100 mL premix        2 g 200 mL/hr over 30 Minutes Intravenous Every 8 hours 06/18/23 1626 06/19/23 1315   06/18/23 1152  ceFAZolin (ANCEF) 2-4 GM/100ML-% IVPB       Note to Pharmacy: Laureen Ochs R: cabinet override      06/18/23 1152 06/18/23 1241   06/18/23 0600  ceFAZolin (ANCEF) IVPB 2g/100 mL premix        2 g 200 mL/hr over 30 Minutes Intravenous On call to O.R.  06/16/23 2153 06/18/23 1246        DVT prophylaxis: On IV heparin  Code Status: Full code  Family Communication:    CONSULTS Orthopedics   Subjective   Pain well-controlled, breathing better after starting Lasix.  Diuresing well.  Started having bleeding from surgical wound from right knee last night.  Heparin has been discontinued   Objective    Physical Examination:  General-appears in no acute distress Heart-S1-S2, regular, no murmur auscultated Lungs-clear to auscultation bilaterally, no wheezing or crackles auscultated Abdomen-soft, nontender, no organomegaly Extremities-right knee in immobilizer Neuro-alert, oriented x3, no focal deficit noted   Status is: Inpatient:          Meredeth Ide   [facility] If 7PM-7AM, please contact night-coverage at [email or web address], Office  [phone]   06/22/2023, 8:21 AM  LOS: 11 days

## 2023-06-22 NOTE — Progress Notes (Signed)
Subjective: 4 Days Post-Op Procedure(s) (LRB): OPEN REDUCTION INTERNAL FIXATION (ORIF) TIBIAL PLATEAU (Right) Patient reports pain as mild.    Objective: Vital signs in last 24 hours: Temp:  [97.3 F (36.3 C)-98.2 F (36.8 C)] 97.6 F (36.4 C) (09/02 0346) Pulse Rate:  [64-91] 64 (09/02 0346) BP: (100-118)/(64-74) 100/64 (09/02 0346) SpO2:  [97 %-100 %] 97 % (09/02 0346) Weight:  [74.7 kg] 74.7 kg (09/02 0346)  Intake/Output from previous day: 09/01 0701 - 09/02 0700 In: 240 [P.O.:240] Out: 1700 [Urine:1700] Intake/Output this shift: Total I/O In: 240 [P.O.:240] Out: -   Recent Labs    06/20/23 0508 06/21/23 0444 06/22/23 0515  HGB 8.3* 7.5* 7.2*   Recent Labs    06/21/23 0444 06/22/23 0515  WBC 6.7 5.4  RBC 2.64* 2.49*  HCT 24.5* 23.5*  PLT 164 175   Recent Labs    06/21/23 0906  NA 128*  K 5.3*  CL 95*  CO2 27  BUN 12  CREATININE 0.99  GLUCOSE 130*  CALCIUM 8.1*   Recent Labs    06/21/23 0444 06/22/23 0515  INR 1.6* 1.8*    Incision: blisters are present, no sanguinous drainage at present No cellulitis present Compartment soft   Assessment/Plan: 4 Days Post-Op Procedure(s) (LRB): OPEN REDUCTION INTERNAL FIXATION (ORIF) TIBIAL PLATEAU (Right) Up with therapy Telfa, kerlex and ace wrap  Monitor wound   Pics in [employer] 06/22/2023, 12:24 PM

## 2023-06-23 DIAGNOSIS — I482 Chronic atrial fibrillation, unspecified: Secondary | ICD-10-CM | POA: Diagnosis not present

## 2023-06-23 DIAGNOSIS — S82121D Displaced fracture of lateral condyle of right tibia, subsequent encounter for closed fracture with routine healing: Secondary | ICD-10-CM | POA: Diagnosis not present

## 2023-06-23 DIAGNOSIS — E039 Hypothyroidism, unspecified: Secondary | ICD-10-CM | POA: Diagnosis not present

## 2023-06-23 DIAGNOSIS — S2242XA Multiple fractures of ribs, left side, initial encounter for closed fracture: Secondary | ICD-10-CM | POA: Diagnosis not present

## 2023-06-23 LAB — BASIC METABOLIC PANEL
Anion gap: 6 (ref 5–15)
BUN: 13 mg/dL (ref 8–23)
CO2: 30 mmol/L (ref 22–32)
Calcium: 8.2 mg/dL — ABNORMAL LOW (ref 8.9–10.3)
Chloride: 93 mmol/L — ABNORMAL LOW (ref 98–111)
Creatinine, Ser: 0.94 mg/dL (ref 0.44–1.00)
GFR, Estimated: >60 mL/min (ref >60)
Glucose, Bld: 92 mg/dL (ref 70–99)
Potassium: 4.5 mmol/L (ref 3.5–5.1)
Sodium: 129 mmol/L — ABNORMAL LOW (ref 135–145)

## 2023-06-23 LAB — CBC
HCT: 22.4 % — ABNORMAL LOW (ref 36.0–46.0)
Hemoglobin: 6.8 g/dL — CL (ref 12.0–15.0)
MCH: 28.7 pg (ref 26.0–34.0)
MCHC: 30.4 g/dL (ref 30.0–36.0)
MCV: 94.5 fL (ref 80.0–100.0)
Platelets: 209 10*3/uL (ref 150–400)
RBC: 2.37 MIL/uL — ABNORMAL LOW (ref 3.87–5.11)
RDW: 15.6 % — ABNORMAL HIGH (ref 11.5–15.5)
WBC: 5.2 10*3/uL (ref 4.0–10.5)
nRBC: 0 % (ref 0.0–0.2)

## 2023-06-23 LAB — PREPARE RBC (CROSSMATCH)

## 2023-06-23 LAB — PROTIME-INR
INR: 2.1 — ABNORMAL HIGH (ref 0.8–1.2)
Prothrombin Time: 24.2 s — ABNORMAL HIGH (ref 11.4–15.2)

## 2023-06-23 MED ORDER — SODIUM CHLORIDE 0.9% IV SOLUTION: Freq: Once | INTRAVENOUS | Status: AC

## 2023-06-23 MED ORDER — WARFARIN SODIUM 2 MG PO TABS
2.0000 mg | ORAL_TABLET | Freq: Once | ORAL | Status: AC
  Administered 2023-06-23: 2 mg via ORAL
  Filled 2023-06-23: qty 1

## 2023-06-23 NOTE — Plan of Care (Signed)
Patient without distress. Medicated for pain with relief. Slept well. Will continue to monitor.   Problem: Education: Goal: Knowledge of General Education information will improve Description: Including pain rating scale, medication(s)/side effects and non-pharmacologic comfort measures Outcome: Progressing   Problem: Health Behavior/Discharge Planning: Goal: Ability to manage health-related needs will improve Outcome: Progressing   Problem: Clinical Measurements: Goal: Ability to maintain clinical measurements within normal limits will improve Outcome: Progressing Goal: Will remain free from infection Outcome: Progressing Goal: Diagnostic test results will improve Outcome: Progressing Goal: Respiratory complications will improve Outcome: Progressing Goal: Cardiovascular complication will be avoided Outcome: Progressing   Problem: Activity: Goal: Risk for activity intolerance will decrease Outcome: Progressing   Problem: Nutrition: Goal: Adequate nutrition will be maintained Outcome: Progressing   Problem: Coping: Goal: Level of anxiety will decrease Outcome: Progressing   Problem: Elimination: Goal: Will not experience complications related to bowel motility Outcome: Progressing Goal: Will not experience complications related to urinary retention Outcome: Progressing   Problem: Pain Managment: Goal: General experience of comfort will improve Outcome: Progressing   Problem: Safety: Goal: Ability to remain free from injury will improve Outcome: Progressing   Problem: Skin Integrity: Goal: Risk for impaired skin integrity will decrease Outcome: Progressing

## 2023-06-23 NOTE — Progress Notes (Signed)
   ORTHOPAEDIC PROGRESS NOTE  S/P: ORIF R tibial plateau fracture   DOS: 06/18/2023  SUBJECTIVE: No issues overnight.  Bleeding has stopped.  Pain is controlled.  No numbness or tingling.   OBJECTIVE: PE:  Alert and oriented.  No acute distress  Minimal swelling about the right knee area.  No surrounding erythema or drainage.  There are multiple blisters surrounding the incision.  The incision is clean, dry and intact.  No active bleeding.  Minimal tenderness to palpation.  She tolerates gentle movement of the right leg.  Sensation is intact distally.  Vitals:   06/22/23 2130 06/23/23 0505  BP: 104/66 (!) 103/59  Pulse: 75 81  Resp:    Temp: 97.8 F (36.6 C) 97.7 F (36.5 C)  SpO2: 97% 96%     ASSESSMENT: [name] is a 72 y.o. female stable following ORIF of right tibial plateau fracture.  Heparin drip is stopped.  Transition to Coumadin.  Hemoglobin is low, but should stabilize.  PLAN: Weightbearing: NWB RLE Incisional and dressing care: Daily dressing changes.  Telfa, Kerlix and Ace wrap.  Xeroform if blisters decompressed.   Orthopedic device(s): Okay to stop knee immobilizer while completing daily wound care. VTE prophylaxis: Heparin drip passed off.  Coumadin has been restarted.  Pain control: As needed Follow - up plan: approximately 2 weeks  Nonweightbearing on the right lower extremity.  Okay to hold the knee immobilizer for now.  Okay for gentle range of motion of the right knee.  Daily dressing changes.  Recommend blood transfusion today.  After this, I anticipate that the hemoglobin will stabilize.    Contact information:     Matty Vanroekel A. Dallas Schimke, MD MS [facility] [address] [place],  [place]  [address] Phone: [phone] Fax: [phone]

## 2023-06-23 NOTE — Progress Notes (Signed)
ANTICOAGULATION CONSULT NOTE  Pharmacy Consult for Heparin>>warfarin Indication: atrial fibrillation and aortic valve replacement  Allergies  Allergen Reactions   Penicillin G Itching and Other (See Comments)   Penicillins Hives, Itching, Swelling and Rash    Patient Measurements: Height: 5\' 8"  (172.7 cm) Weight: 76.3 kg (168 lb 3.4 oz) IBW/kg (Calculated) : 63.9 Heparin Dosing Weight: 72.2 kg  Vital Signs: Temp: 97.7 F (36.5 C) (09/03 0505) Temp Source: Oral (09/03 0505) BP: 103/59 (09/03 0505) Pulse Rate: 81 (09/03 0505)  Labs: Recent Labs    06/21/23 0444 06/21/23 0906 06/21/23 1753 06/22/23 0515 06/23/23 0538  HGB 7.5*  --   --  7.2* 6.8*  HCT 24.5*  --   --  23.5* 22.4*  PLT 164  --   --  175 209  LABPROT 19.1*  --   --  21.0* 24.2*  INR 1.6*  --   --  1.8* 2.1*  HEPARINUNFRC <0.10*  --  0.20* 0.32  --   CREATININE  --  0.99  --   --   --     Estimated Creatinine Clearance: 52.6 mL/min (by C-G formula based on SCr of 0.99 mg/dL).   Medical History: Past Medical History:  Diagnosis Date   Anxiety disorder    Aortic stenosis    Status post St. Jude mechanical AVR 2007   Asthma    Atrial fibrillation (HCC)    Carcinoid tumor of colon 2007   Chronic diastolic heart failure (HCC)    Coronary atherosclerosis of native coronary artery    Status post CABG 2007   GERD (gastroesophageal reflux disease)    History of colonoscopy 2003   Dr. 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 Jena Gauss, benign biopsies   RLS (restless legs syndrome)    Schatzki's ring     Assessment: Pharmacy consulted to dose heparin in patient with atrial fibrillation and mechanical AVR. Patient's warfarin home dose listed as 1 mg on Fridays and 2 mg ROW.   Patient s/p ORIF 8/29.   INR 2.1, therapeutic. Warfarin restarted 8/30 post op. Hemoglobin trending down 8.3> 7.5> 7.2 > 6.8 . Plan to  transfuse Leg bleeding noted overnight on 9/1 appeared resolved by the afternoon. Bleeding has now stopped, orthopedics recommend to continue on anticoagulation.  Goal of Therapy:  INR 2-3 Monitor platelets by anticoagulation protocol: Yes   Plan:  Warfarin tonight - 2mg  x 1 today Will follow closely for any further signs or symptoms of bleeding Daily PT/INR  Elder Cyphers, BS Pharm D, BCPS Clinical Pharmacist 06/23/2023 7:38 AM

## 2023-06-23 NOTE — Progress Notes (Addendum)
[facility]  PROGRESS NOTE  [name] IHK:[id number] DOB: [birth year] DOA: 06/11/2023 PCP: Rebekah Chesterfield, NP   Brief HPI:   72 year old female with PMH of chronic diastolic CHF, chronic respiratory failure on home oxygen 2 L/min, atrial fibrillation and mechanical AVR on warfarin anticoagulation, COPD, presented to the ED on 8/22 following a fall with left-sided chest and right knee pain. Admitted for nondisplaced left anterior 4-7 rib fractures, comminuted displaced fracture involving the right lateral tibial plateau. Orthopedics consulted and plan ORIF on 06/18/2023. Cardiology consulted and provided preop clearance.     Assessment/Plan:   Right lateral tibial plateau fracture, Mendez/p ORIF Orthopedic consultation obtained, patient underwent fixation of right lateral tibial plateau fracture -Heparin was stopped yesterday as patient had bleeding from the surgical wound -Bleeding has now stopped, orthopedics recommend to start back on heparin and warfarin -Started back on heparin and warfarin Cardiology consulted, input appreciated, patient at increased risk but not prohibitive risk for surgery and recommended proceeding with surgery with no further cardiac testing or intervention. Nonweightbearing right lower extremity in knee immobilizer. -Follow-up orthopedics in 2 weeks   Hyponatremia -Sodium is again down to 129 -Osmolality 288 -Urine osmolality 110 and urine sodium pending less than 10 -Continue fluid restriction   Acute blood loss anemia -Status post bleeding last night from surgical wound, stopped at this time. Hemoglobin dropped to 6.8 this morning -Transfuse 1 unit PRBC -Follow CBC in a.m.   Thrombocytopenia Again could be due to acute blood loss. Resolved   Nondisplaced left anterior 4-7 rib fractures/left chest wall pain Optimize nonopioid pain regimen, incentive spirometry encouraged.   Hypothyroidism -Continue Synthroid.   Hypokalemia Replaced.    Magnesium 2.2.   Benign essential HTN -Blood pressure is labile between soft and normal -Monitor closely. -Holding antihypertensive agents at the moment.   Chronic diastolic CHF (congestive heart failure) (HCC) -Developed worsening shortness of breath, chest x-ray showed cardiomegaly -Significantly improved after diuresis -Continue  Lasix at low-dose of 20 mg twice daily; diuresing well -  Last echo 08/2022 EF of 55 to 60%. -Holding torsemide and beta-blocker in the setting of soft blood pressure -Started on low-dose Lasix 20 mg p.o. twice daily with good diuretic response. -Will discharge on Lasix at current dose of 20 mg twice daily and discontinue torsemide   CKD (chronic kidney disease) stage 3b, GFR 30-59 ml/min (HCC) -Creatinine stable in the 1.4-1.5 range -Monitor closely.  Avoid hypotension or nephrotoxic'Mendez. -Creatinine has actually improved to 1.11.   Chronic anticoagulation for permanent A-fib and mechanical AVR As per cardiology office note on 03/30/2023, history of aortic stenosis, Mendez/p St Jude mechanical AVR in 2007 along with graft repair of AAA. On warfarin for atrial fibrillation. -Continue holding bisoprolol  in the setting of soft blood pressure. -Started on Cardizem 30 mg p.o. every 6 hours and low-dose beta-blocker metoprolol 12.5 mg p.o. twice daily -Started back on heparin and warfarin per pharmacy; however heparin has been stopped due to recurrent bleeding from surgical wound. -INR is 2.1, continue Coumadin per pharmacy    H/O heart valve replacement with mechanical aortic valve -Coumadin has been restarted as above    Atrial fibrillation, chronic (HCC) -Heart rate went to 110  -Started back on Cardizem 30 mg p.o. every 6 hours, low-dose metoprolol 12.5 mg p.o. twice daily -Rate controlled -Continue telemetry monitoring.   GERD Continue PPI   Hyperlipidemia Patient/family unsure of dose of Crestor. Resumed lower-dose Crestor 10 Mg  daily.  Hyperkalemia -Resolved  Disposition-skilled nursing  facility, likely in a.m.  Medications     acetaminophen  1,000 mg Oral QID   diltiazem  30 mg Oral Q6H   furosemide  20 mg Oral BID   levothyroxine  50 mcg Oral Q0600   lumateperone tosylate  42 mg Oral Daily   metoprolol tartrate  12.5 mg Oral BID   pantoprazole  40 mg Oral Daily   rOPINIRole  3 mg Oral BID   rosuvastatin  10 mg Oral Daily   warfarin  2 mg Oral ONCE-1600   Warfarin - Pharmacist Dosing Inpatient   Does not apply q1600     Data Reviewed:   CBG:  No results for input(Mendez): "GLUCAP" in the last 168 hours.  SpO2: 97 % O2 Flow Rate (L/min): 2 L/min    Vitals:   06/22/23 2130 06/23/23 0505 06/23/23 1132 06/23/23 1157  BP: 104/66 (!) 103/59 96/63 102/71  Pulse: 75 81 75 74  Resp:   18 18  Temp: 97.8 F (36.6 C) 97.7 F (36.5 C) 98 F (36.7 C) 97.7 F (36.5 C)  TempSrc: Oral Oral Oral   SpO2: 97% 96% 97%   Weight:  76.3 kg    Height:          Data Reviewed:  Basic Metabolic Panel: Recent Labs  Lab 06/17/23 0421 06/19/23 0447 06/21/23 0906 06/23/23 0847  NA 128* 131* 128* 129*  K 4.1 3.9 5.3* 4.5  CL 96* 98 95* 93*  CO2 24 27 27 30   GLUCOSE 85 99 130* 92  BUN 14 12 12 13   CREATININE 1.06* 1.16* 0.99 0.94  CALCIUM 8.4* 8.4* 8.1* 8.2*    CBC: Recent Labs  Lab 06/19/23 0447 06/20/23 0508 06/21/23 0444 06/22/23 0515 06/23/23 0538  WBC 8.0 7.0 6.7 5.4 5.2  HGB 9.3* 8.3* 7.5* 7.2* 6.8*  HCT 29.3* 26.7* 24.5* 23.5* 22.4*  MCV 91.0 93.0 92.8 94.4 94.5  PLT 193 183 164 175 209    LFT No results for input(Mendez): "AST", "ALT", "ALKPHOS", "BILITOT", "PROT", "ALBUMIN" in the last 168 hours.    Antibiotics: Anti-infectives (From admission, onward)    Start     Dose/Rate Route Frequency Ordered Stop   06/18/23 2000  ceFAZolin (ANCEF) IVPB 2g/100 mL premix        2 g 200 mL/hr over 30 Minutes Intravenous Every 8 hours 06/18/23 1626 06/19/23 1315   06/18/23 1152  ceFAZolin  (ANCEF) 2-4 GM/100ML-% IVPB       Note to Pharmacy: Laureen Ochs R: cabinet override      06/18/23 1152 06/18/23 1241   06/18/23 0600  ceFAZolin (ANCEF) IVPB 2g/100 mL premix        2 g 200 mL/hr over 30 Minutes Intravenous On call to O.R. 06/16/23 2153 06/18/23 1246        DVT prophylaxis: On IV heparin  Code Status: Full code  Family Communication:    CONSULTS Orthopedics   Subjective   Bleeding has stopped from the surgical wound.  Objective    Physical Examination:  General-appears in no acute distress Heart-S1-S2, regular, no murmur auscultated Lungs-clear to auscultation bilaterally, no wheezing or crackles auscultated Abdomen-soft, nontender, no organomegaly Extremities-right lower extremity  in knee immobilizer Neuro-alert, oriented x3, no focal deficit noted   Status is: Inpatient:          [name] [name]   [facility] If 7PM-7AM, please contact night-coverage at [email or web address], Office  [phone]   06/23/2023, 1:51 PM  LOS: 12 days

## 2023-06-23 NOTE — TOC Progression Note (Signed)
Transition of Care [facility]) - Progression Note    Patient Details  Name: [name] MRN: [id number] Date of Birth: [birth year]  Transition of Care [facility]) CM/SW Contact  Villa Herb, [place] Phone Number: 06/23/2023, 10:28 AM  Clinical Narrative:    CSW updated by MD that pt will not be ready until tomorrow for D/C to SNF. CSW to update insurance auth to see if there can be a 24 hours extension as auth expires today 9/3. TOC to follow.   Expected Discharge Plan: Home/Self Care Barriers to Discharge: Continued Medical Work up  Expected Discharge Plan and Services       Living arrangements for the past 2 months: Single Family Home                                       Social Determinants of Health (SDOH) Interventions SDOH Screenings   Food Insecurity: No Food Insecurity (06/11/2023)  Housing: Low Risk  (06/11/2023)  Transportation Needs: No Transportation Needs (06/11/2023)  Utilities: Not At Risk (06/11/2023)  Financial Resource Strain: Low Risk  (07/28/2018)  Physical Activity: Inactive (07/28/2018)  Social Connections: Somewhat Isolated (07/28/2018)  Stress: No Stress Concern Present (07/28/2018)  Tobacco Use: Medium Risk (06/18/2023)    Readmission Risk Interventions     No data to display

## 2023-06-24 ENCOUNTER — Encounter (HOSPITAL_COMMUNITY): Payer: Self-pay | Admitting: Orthopedic Surgery

## 2023-06-24 DIAGNOSIS — N1832 Chronic kidney disease, stage 3b: Secondary | ICD-10-CM | POA: Diagnosis not present

## 2023-06-24 DIAGNOSIS — I482 Chronic atrial fibrillation, unspecified: Secondary | ICD-10-CM | POA: Diagnosis not present

## 2023-06-24 DIAGNOSIS — Z7901 Long term (current) use of anticoagulants: Secondary | ICD-10-CM | POA: Diagnosis not present

## 2023-06-24 LAB — CBC
HCT: 25.9 % — ABNORMAL LOW (ref 36.0–46.0)
Hemoglobin: 8.2 g/dL — ABNORMAL LOW (ref 12.0–15.0)
MCH: 28.8 pg (ref 26.0–34.0)
MCHC: 31.7 g/dL (ref 30.0–36.0)
MCV: 90.9 fL (ref 80.0–100.0)
Platelets: 232 10*3/uL (ref 150–400)
RBC: 2.85 MIL/uL — ABNORMAL LOW (ref 3.87–5.11)
RDW: 16 % — ABNORMAL HIGH (ref 11.5–15.5)
WBC: 5.4 10*3/uL (ref 4.0–10.5)
nRBC: 0 % (ref 0.0–0.2)

## 2023-06-24 LAB — TYPE AND SCREEN
ABO/RH(D): O POS
Antibody Screen: NEGATIVE
Unit division: 0

## 2023-06-24 LAB — BPAM RBC
Blood Product Expiration Date: 202409202359
ISSUE DATE / TIME: 202409031137
Unit Type and Rh: 5100

## 2023-06-24 LAB — PROTIME-INR
INR: 2.6 — ABNORMAL HIGH (ref 0.8–1.2)
Prothrombin Time: 28.2 s — ABNORMAL HIGH (ref 11.4–15.2)

## 2023-06-24 MED ORDER — DILTIAZEM HCL ER COATED BEADS 120 MG PO CP24
120.0000 mg | ORAL_CAPSULE | Freq: Every day | ORAL | Status: DC
  Administered 2023-06-24: 120 mg via ORAL
  Filled 2023-06-24: qty 1

## 2023-06-24 MED ORDER — WARFARIN SODIUM 2 MG PO TABS
2.0000 mg | ORAL_TABLET | Freq: Once | ORAL | Status: AC
  Administered 2023-06-24: 2 mg via ORAL
  Filled 2023-06-24: qty 1

## 2023-06-24 MED ORDER — ALPRAZOLAM 0.5 MG PO TABS: 0.5000 mg | ORAL_TABLET | Freq: Two times a day (BID) | ORAL | 0 refills | Status: DC

## 2023-06-24 MED ORDER — TORSEMIDE 20 MG PO TABS
80.0000 mg | ORAL_TABLET | Freq: Every day | ORAL | Status: DC
  Administered 2023-06-24: 80 mg via ORAL
  Filled 2023-06-24: qty 4

## 2023-06-24 MED ORDER — OXYCODONE HCL 10 MG PO TABS: 10.0000 mg | ORAL_TABLET | ORAL | 0 refills | Status: DC | PRN

## 2023-06-24 NOTE — Progress Notes (Signed)
ANTICOAGULATION CONSULT NOTE  Pharmacy Consult for Heparin>>warfarin Indication: atrial fibrillation and aortic valve replacement  Allergies  Allergen Reactions   Penicillin G Itching and Other (See Comments)   Penicillins Hives, Itching, Swelling and Rash    Patient Measurements: Height: 5\' 8"  (172.7 cm) Weight: 77.7 kg (171 lb 4.8 oz) IBW/kg (Calculated) : 63.9 Heparin Dosing Weight: 72.2 kg  Vital Signs: Temp: 97.8 F (36.6 C) (09/04 0318) Temp Source: Oral (09/04 0318) BP: 103/69 (09/04 0318) Pulse Rate: 69 (09/04 0318)  Labs: Recent Labs    06/21/23 0906 06/21/23 1753 06/22/23 0515 06/22/23 0515 06/23/23 0538 06/23/23 0847 06/24/23 0444  HGB  --   --  7.2*   < > 6.8*  --  8.2*  HCT  --   --  23.5*  --  22.4*  --  25.9*  PLT  --   --  175  --  209  --  232  LABPROT  --   --  21.0*  --  24.2*  --  28.2*  INR  --   --  1.8*  --  2.1*  --  2.6*  HEPARINUNFRC  --  0.20* 0.32  --   --   --   --   CREATININE 0.99  --   --   --   --  0.94  --    < > = values in this interval not displayed.    Estimated Creatinine Clearance: 60.1 mL/min (by C-G formula based on SCr of 0.94 mg/dL).   Medical History: Past Medical History:  Diagnosis Date   Anxiety disorder    Aortic stenosis    Status post [employer] mechanical AVR 2007   Asthma    Atrial fibrillation (HCC)    Carcinoid tumor of colon 2007   Chronic diastolic heart failure (HCC)    Coronary atherosclerosis of native coronary artery    Status post CABG 2007   GERD (gastroesophageal reflux disease)    History of colonoscopy 2003   Dr. 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 Jena Gauss, benign biopsies   RLS (restless legs syndrome)    Schatzki's ring     Assessment: Pharmacy consulted to dose heparin in patient with atrial fibrillation and mechanical AVR. Patient's warfarin home dose listed as 1 mg on Fridays and 2 mg ROW.    Patient s/p ORIF 8/29.   INR 2.6, therapeutic. Warfarin restarted 8/30 post op. Hemoglobin trending down 8.3> 7.5> 7.2 > 6.8, transfused now 7.5 Leg bleeding noted  has resolved. Orthopedics recommended to continue on anticoagulation.  Goal of Therapy:  INR 2-3 Monitor platelets by anticoagulation protocol: Yes   Plan:  Warfarin tonight - 2mg  x 1 today Will follow closely for any further signs or symptoms of bleeding Daily PT/INR  Elder Cyphers, BS Pharm D, BCPS Clinical Pharmacist 06/24/2023 7:35 AM

## 2023-06-24 NOTE — Discharge Summary (Signed)
Physician Discharge Summary   Patient: NYANA ZELLMAN MRN: [id number] DOB: [birth year]  Admit date:     06/11/2023  Discharge date: 06/24/23  Discharge Physician: [name] Salaam [place]   PCP: Rebekah Chesterfield, NP   Recommendations at discharge:   Please follow up with primary care provider within 1-2 weeks  Please repeat BMP and CBC in one week  Please follow up on/with ortho, Dr. Dallas Schimke in 2 weeks Keep patient on 2L [place]   Discharge Diagnoses: Principal Problem:   Right tibial fracture Active Problems:   Rib fractures   Hypokalemia   Hypothyroidism   Atrial fibrillation, chronic (HCC)   H/O heart valve replacement with mechanical valve   Chronic anticoagulation   Coronary artery disease of bypass graft of native heart with stable angina pectoris (HCC)   CKD (chronic kidney disease) stage 3, GFR 30-59 ml/min (HCC)   CHF (congestive heart failure) (HCC)   Benign essential HTN  Resolved Problems:   * No resolved hospital problems. *  Hospital Course: 72 y.o. 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  on 8/22 following a fall with left-sided chest and right knee pain. Admitted for nondisplaced left anterior 4-7 rib fractures, comminuted displaced fracture involving the right lateral tibial plateau. Orthopedics consulted and plan ORIF on 06/18/2023. Cardiology consulted and provided preop clearance.  Her postoperative course was prolonged secondary to blood loss anemia.  She was transfused 1 unit PRBC.  PT saw the patient and recommended skilled nursing facility with which the patient agreed.  Assessment and Plan: Right lateral tibial plateau fracture, s/p ORIF Orthopedic consultation obtained,  patient underwent fixation of right lateral tibial plateau fracture 8/29 -Heparin was stopped 9/2 as patient had bleeding from the surgical wound -Bleeding has now stopped, orthopedics recommend to start back on heparin and warfarin -Started back on heparin and warfarin Cardiology consulted, input appreciated, patient at increased risk but not prohibitive risk for surgery and recommended proceeding with surgery with no further cardiac testing or intervention. Nonweightbearing right lower extremity in knee immobilizer until follow up with ortho in office Okay for gentle range of motion of the right knee. Daily dressing changes  -Follow-up orthopedics in 2 weeks    Hyponatremia -Sodium is again down to 129 -Osmolality 288 -Urine osmolality 110 and urine sodium pending less than 10 -due to CHF -restart home torsemide   Acute blood loss anemia -Status post bleeding last night from surgical wound, stopped at this time. Hemoglobin dropped to 6.8 this morning -Transfuse 1 unit PRBC -Hgg 8.2 on day of dc  Chronic respiratory failure with hypoxia -due to COPD -she is chronically on 2L [place]   Thrombocytopenia Due to acute medical condition -improved   Nondisplaced left anterior 4-7 rib fractures/left chest wall pain Optimize nonopioid pain regimen, incentive spirometry encouraged.    Hypothyroidism -Continue Synthroid.   Hypokalemia Replaced.   Magnesium 2.2.   Benign essential HTN -Blood pressure is labile between soft and normal -Monitor closely. -Holding antihypertensive agents at the moment.   Chronic diastolic CHF (congestive heart failure) (HCC) -Developed worsening shortness of breath, chest x-ray showed cardiomegaly -Significantly improved after diuresis -  Last echo 08/2022 EF of 55 to 60%. -Holding torsemide and beta-blocker in the setting of soft blood  pressure -Started on low-dose Lasix 20 mg p.o. twice daily initially>>transition back to home dose torsemide   CKD  (chronic kidney disease) stage 3b, GFR 30-59 ml/min (HCC) -Creatinine stable in the 1.1-1.5 range -Monitor closely.  Avoid hypotension or nephrotoxic's. -Creatinine has actually improved to 1.11.   Chronic anticoagulation for permanent A-fib and mechanical AVR As per cardiology office note on 03/30/2023, history of aortic stenosis, s/p [employer] mechanical AVR in 2007 along with graft repair of AAA. On warfarin for atrial fibrillation. -Continue holding bisoprolol  in the setting of soft blood pressure. -Started on Cardizem 30 mg p.o. every 6 hours and low-dose beta-blocker metoprolol 12.5 mg p.o. twice daily -Started back on heparin and warfarin per pharmacy; however heparin has been stopped due to recurrent bleeding from surgical wound. -INR is 2.6 on day of d/c, continue Coumadin per pharmacy     H/O heart valve replacement with mechanical aortic valve -Coumadin has been restarted as above     Atrial fibrillation, permanent -Started back on Cardizem 30 mg p.o. every 6 hours -Rate controlled -Continue telemetry monitoring.   GERD Continue PPI   Hyperlipidemia Patient/family unsure of dose of Crestor. Resumed lower-dose Crestor 10 Mg daily.   Hyperkalemia -Resolved         Consultants: cardiology, ortho Procedures performed: ORIF  Disposition: Home Diet recommendation:  Cardiac diet DISCHARGE MEDICATION: Allergies as of 06/24/2023       Reactions   Penicillin G Itching, Other (See Comments)   Penicillins Hives, Itching, Swelling, Rash        Medication List     STOP taking these medications    HYDROcodone-acetaminophen 5-325 MG tablet Commonly known as: NORCO/VICODIN       TAKE these medications    ALPRAZolam 0.5 MG tablet Commonly known as: XANAX Take 1 tablet (0.5 mg total) by mouth 2 (two) times daily.   bisoprolol 5 MG tablet Commonly known as: ZEBETA Take 5 mg by mouth daily. What changed: Another medication with the same name was removed.  Continue taking this medication, and follow the directions you see here.   Caplyta 42 MG capsule Generic drug: lumateperone tosylate Take 42 mg by mouth daily.   diltiazem 120 MG 24 hr capsule Commonly known as: CARDIZEM CD Take 1 capsule (120 mg total) by mouth daily.   Farxiga 10 MG Tabs tablet Generic drug: dapagliflozin propanediol Take 10 mg by mouth daily.   ipratropium 0.06 % nasal spray Commonly known as: ATROVENT Place 2 sprays into both nostrils in the morning and at bedtime.   levothyroxine 50 MCG tablet Commonly known as: SYNTHROID Take 50 mcg by mouth daily before breakfast.   multivitamin with minerals Tabs tablet Take 1 tablet by mouth daily.   omeprazole 20 MG capsule Commonly known as: PRILOSEC Take 20 mg by mouth daily.   Oxycodone HCl 10 MG Tabs Take 1 tablet (10 mg total) by mouth every 4 (four) hours as needed for severe pain.   OXYGEN Inhale 2 L into the lungs daily.   rOPINIRole 3 MG tablet Commonly known as: REQUIP Take 3 mg by mouth 2 (two) times daily.   rosuvastatin 10 MG tablet Commonly known as: CRESTOR Take 10 mg by mouth daily.   rosuvastatin 20 MG tablet Commonly known as: CRESTOR TAKE ONE (1) TABLET BY MOUTH EVERY DAY   torsemide 100 MG tablet Commonly known as: DEMADEX Take 100 mg by mouth daily.   warfarin 2 MG tablet Commonly known as: COUMADIN Take as directed.  If you are unsure how to take this medication, talk to your nurse or doctor. Original instructions: Take 1.5-2 tablets (3-4 mg total) by mouth See admin instructions. 4mg  on Tuesday and 3 mg daily the rest of the week.        Contact information for after-discharge care     Destination     [facility] Preferred SNF .   Service: Skilled Nursing Contact information: [address] [place] [address] [phone]                    Discharge Exam: Ceasar Mons Weights   06/22/23 0346 06/23/23 0505 06/24/23 0500   Weight: 74.7 kg 76.3 kg 77.7 kg   HEENT:  [place]/AT, No thrush, no icterus CV:  RRR, no rub, no S3, no S4 Lung:  bibasilar crackles.  No wheeze Abd:  soft/+BS, NT Ext:  trace LE edema, no lymphangitis, no synovitis, no rash   Condition at discharge: stable  The results of significant diagnostics from this hospitalization (including imaging, microbiology, ancillary and laboratory) are listed below for reference.   Imaging Studies: DG Chest Port 1V same Day  Result Date: 06/20/2023 CLINICAL DATA:  Dyspnea EXAM: PORTABLE CHEST 1 VIEW COMPARISON:  06/11/2023 FINDINGS: Single frontal view of the chest demonstrates stable enlargement of the cardiac silhouette. Stable postsurgical changes from median sternotomy and aortic valve replacement. Areas of linear consolidation at the lung bases likely reflect atelectasis. Background scarring without acute airspace disease, effusion, or pneumothorax. No acute bony abnormalities. The nondisplaced left rib fracture seen on recent CT are not visible by x-ray. IMPRESSION: 1. Bibasilar hypoventilatory changes. 2. Stable enlarged cardiac silhouette. Electronically Signed   By: Sharlet Salina M.D.   On: 06/20/2023 21:23   DG Tibia/Fibula Right  Result Date: 06/18/2023 CLINICAL DATA:  [id number] Surgery, elective [id number]. Tibial plateau fracture. EXAM: RIGHT TIBIA AND FIBULA - 2 VIEW COMPARISON:  None Available. FINDINGS: The provided image demonstrates open reduction internal fixation of proximal tibia, post tibial plateau fracture. Please refer to the separately issued operative report for further details. Fluoroscopy time: 76 seconds Radiation dose: 4.55 mGy IMPRESSION: 1. Open reduction internal fixation of proximal tibia, post tibial plateau fracture. Electronically Signed   By: Jules Schick M.D.   On: 06/18/2023 15:26   DG Knee Right Port  Result Date: 06/18/2023 CLINICAL DATA:  [id number] Tibial plateau fracture [id number] EXAM: PORTABLE RIGHT KNEE - 1-2 VIEW COMPARISON:   None Available. FINDINGS: The provided image demonstrates open reduction internal fixation of proximal tibia with plate and multiple cortical screws. No periprosthetic fracture or lucency. Patient's known tibial plateau fracture is again seen. There is air within the subcutaneous tissue over the lateral aspect, related to surgery. Please refer to the separately issued operative report for further details. IMPRESSION: 1. Open reduction internal fixation of proximal tibial fracture. Electronically Signed   By: Jules Schick M.D.   On: 06/18/2023 15:25   DG C-Arm 1-60 Min-No Report  Result Date: 06/18/2023 Fluoroscopy was utilized by the requesting physician.  No radiographic interpretation.   DG C-Arm 1-60 Min-No Report  Result Date: 06/18/2023 Fluoroscopy was utilized by the requesting physician.  No radiographic interpretation.   CT Knee Right Wo Contrast  Result Date: 06/11/2023 CLINICAL DATA:  Tibial plateau fracture EXAM: CT OF THE RIGHT KNEE WITHOUT CONTRAST TECHNIQUE: Multidetector CT imaging of the right knee was performed according to the standard protocol. Multiplanar CT image reconstructions were also generated. RADIATION DOSE REDUCTION: This  exam was performed according to the departmental dose-optimization program which includes automated exposure control, adjustment of the mA and/or kV according to patient size and/or use of iterative reconstruction technique. COMPARISON:  Knee x-ray 06/11/2023 earlier FINDINGS: Bones/Joint/Cartilage Osteopenia. There is a comminuted fracture of the lateral tibial plateau at the level of the joint space. There is a focal area of step-off with the depressed fragments approaching up to 2 mm at the level of the joint line medially. No additional fracture or dislocation. There are osteophytes seen of all 3 compartments as well. There is some joint space loss involving the patellofemoral joint particularly laterally. Small joint effusion. Ligaments Suboptimally  assessed by CT. Muscles and Tendons Global muscle fatty atrophy identified. Soft tissues Soft tissue edema identified. IMPRESSION: Comminuted depressed fracture involving the lateral tibial plateau. Step-off of the level of the joint line approaches 2 mm No additional fracture or dislocation. Severe osteopenia. Tricompartment degenerative changes. Small joint effusion. Electronically Signed   By: Karen Kays M.D.   On: 06/11/2023 19:10   CT CHEST ABDOMEN PELVIS WO CONTRAST  Result Date: 06/11/2023 CLINICAL DATA:  Trauma.  Hit left ribs. EXAM: CT CHEST, ABDOMEN AND PELVIS WITHOUT CONTRAST TECHNIQUE: Multidetector CT imaging of the chest, abdomen and pelvis was performed following the standard protocol without IV contrast. RADIATION DOSE REDUCTION: This exam was performed according to the departmental dose-optimization program which includes automated exposure control, adjustment of the mA and/or kV according to patient size and/or use of iterative reconstruction technique. COMPARISON:  CT abdomen and pelvis 04/20/2013 FINDINGS: CT CHEST FINDINGS Cardiovascular: The aorta is ectatic. Bovine arch anatomy is noted. There are atherosclerotic calcifications of the aorta and coronary arteries. The heart is enlarged. There is no pericardial effusion. Patient is status post cardiac surgery. Mediastinum/Nodes: No enlarged mediastinal, hilar, or axillary lymph nodes. Thyroid gland, trachea, and esophagus demonstrate no significant findings. There is no pneumomediastinum or mediastinal hematoma. Lungs/Pleura: There some scattered peripheral reticular opacities throughout the lungs, greater on the right. There is no focal lung consolidation, pleural effusion or pneumothorax. Musculoskeletal: There are nondisplaced acute left anterior fourth, fifth, sixth and seventh rib fractures. There are additional healed bilateral rib fractures. Sternotomy wires are present. CT ABDOMEN PELVIS FINDINGS Hepatobiliary: No focal liver  abnormality is seen. No gallstones, gallbladder wall thickening, or biliary dilatation. Pancreas: Unremarkable. No pancreatic ductal dilatation or surrounding inflammatory changes. Spleen: Normal in size without focal abnormality. Adrenals/Urinary Tract: Adrenal glands are unremarkable. There is no hydronephrosis or urinary tract calculus. Cortical cysts are seen in both kidneys measuring 1 cm or less. Bladder is unremarkable. Stomach/Bowel: Multiple bowel anastomoses are present. The appendix is not seen. There is no focal inflammation, pneumatosis or free air. There is no evidence for bowel obstruction. The stomach is within normal limits. Colonic diverticula are present. Vascular/Lymphatic: Aortic atherosclerosis. No enlarged abdominal or pelvic lymph nodes. Reproductive: Status post hysterectomy. No adnexal masses. Other: No abdominal wall hernia or abnormality. No abdominopelvic ascites. Musculoskeletal: The bones are osteopenic. There is moderate compression deformity of L2 which is new from 2014. This is favored as chronic, but age indeterminate. There is mild retropulsion of fracture fragments without significant central spinal canal stenosis. IMPRESSION: 1. Acute nondisplaced left anterior fourth through seventh rib fractures. No pneumothorax. 2. Moderate compression deformity of L2 is new from 2014. This is favored as chronic, but age indeterminate. 3. No acute localizing process in the abdomen or pelvis. 4. Scattered peripheral reticular opacities throughout the lungs, greater on the right, may  represent chronic interstitial lung disease. 5. Cardiomegaly. 6. Bosniak I benign renal cyst measuring 1 cm. No follow-up imaging is recommended. JACR 2018 Feb; 264-273, Management of the Incidental Renal Mass on CT, [employer] 2021; [id number], Bosniak Classification of Cystic Renal Masses, Version 2019. Aortic Atherosclerosis (ICD10-I70.0). Electronically Signed   By: Darliss Cheney M.D.   On: 06/11/2023 16:45    CT Head Wo Contrast  Result Date: 06/11/2023 CLINICAL DATA:  Polytrauma, blunt fall EXAM: CT HEAD WITHOUT CONTRAST CT CERVICAL SPINE WITHOUT CONTRAST TECHNIQUE: Multidetector CT imaging of the head and cervical spine was performed following the standard protocol without intravenous contrast. Multiplanar CT image reconstructions of the cervical spine were also generated. RADIATION DOSE REDUCTION: This exam was performed according to the departmental dose-optimization program which includes automated exposure control, adjustment of the mA and/or kV according to patient size and/or use of iterative reconstruction technique. COMPARISON:  None Available. FINDINGS: CT HEAD FINDINGS Brain: No evidence of acute infarction, hemorrhage, hydrocephalus, extra-axial collection or mass lesion/mass effect. Scattered low-density changes within the periventricular and subcortical white matter most compatible with chronic microvascular ischemic change. Mild diffuse cerebral volume loss. Vascular: Atherosclerotic calcifications involving the large vessels of the skull base. No unexpected hyperdense vessel. Skull: Normal. Negative for fracture or focal lesion. Sinuses/Orbits: No acute finding. Other: Negative for scalp hematoma. CT CERVICAL SPINE FINDINGS Alignment: Facet joints are aligned without dislocation or traumatic listhesis. Dens and lateral masses are aligned. Skull base and vertebrae: No acute fracture. No primary bone lesion or focal pathologic process. Soft tissues and spinal canal: No prevertebral fluid or swelling. No visible canal hematoma. Disc levels: Disc heights are relatively well preserved. Minor facet spurring. Upper chest: See dedicated CT chest, abdomen, pelvis for full assessment of the intrathoracic findings. Other: Bilateral carotid atherosclerosis. IMPRESSION: 1. No acute intracranial abnormality. 2. No acute fracture or subluxation of the cervical spine. Electronically Signed   By: Duanne Guess  D.O.   On: 06/11/2023 16:37   CT Cervical Spine Wo Contrast  Result Date: 06/11/2023 CLINICAL DATA:  Polytrauma, blunt fall EXAM: CT HEAD WITHOUT CONTRAST CT CERVICAL SPINE WITHOUT CONTRAST TECHNIQUE: Multidetector CT imaging of the head and cervical spine was performed following the standard protocol without intravenous contrast. Multiplanar CT image reconstructions of the cervical spine were also generated. RADIATION DOSE REDUCTION: This exam was performed according to the departmental dose-optimization program which includes automated exposure control, adjustment of the mA and/or kV according to patient size and/or use of iterative reconstruction technique. COMPARISON:  None Available. FINDINGS: CT HEAD FINDINGS Brain: No evidence of acute infarction, hemorrhage, hydrocephalus, extra-axial collection or mass lesion/mass effect. Scattered low-density changes within the periventricular and subcortical white matter most compatible with chronic microvascular ischemic change. Mild diffuse cerebral volume loss. Vascular: Atherosclerotic calcifications involving the large vessels of the skull base. No unexpected hyperdense vessel. Skull: Normal. Negative for fracture or focal lesion. Sinuses/Orbits: No acute finding. Other: Negative for scalp hematoma. CT CERVICAL SPINE FINDINGS Alignment: Facet joints are aligned without dislocation or traumatic listhesis. Dens and lateral masses are aligned. Skull base and vertebrae: No acute fracture. No primary bone lesion or focal pathologic process. Soft tissues and spinal canal: No prevertebral fluid or swelling. No visible canal hematoma. Disc levels: Disc heights are relatively well preserved. Minor facet spurring. Upper chest: See dedicated CT chest, abdomen, pelvis for full assessment of the intrathoracic findings. Other: Bilateral carotid atherosclerosis. IMPRESSION: 1. No acute intracranial abnormality. 2. No acute fracture or subluxation of the cervical spine.  Electronically Signed   By: Duanne Guess D.O.   On: 06/11/2023 16:37   DG Knee Complete 4 Views Right  Result Date: 06/11/2023 CLINICAL DATA:  Fall.  Right knee pain. EXAM: RIGHT KNEE - COMPLETE 4+ VIEW COMPARISON:  None Available. FINDINGS: There is diffuse decreased bone mineralization. There appears to be 1 mm lucent cortical defect within the central aspect of the lateral tibial plateau. On oblique view there is a more distinct 4 mm craniocaudal depth fracture line lucency in this region at the junction of the lateral tibial spine and lateral tibial plateau. There is up to approximately 4 mm depression of the more lateral aspect of the lateral tibial plateau with additional mild subcortical lucency suspicious for an acute fracture. Moderate distal lateral tibial plateau degenerative osteophytosis. Moderate patellofemoral joint space narrowing and peripheral osteophytosis. Tiny joint effusion. Surgical clips overlie the medial knee soft tissues. IMPRESSION: 1. Acute fracture of the junction of the lateral tibial spine and lateral tibial plateau. There is up to 4 mm depression of the more lateral aspect of lateral tibial plateau which may be acute or chronic. There do appear to be some subcortical acute fracture lines in this region of the lateral aspect of the lateral tibial plateau. 2. Moderate patellofemoral osteoarthritis. Electronically Signed   By: Neita Garnet M.D.   On: 06/11/2023 16:32   DG Chest 2 View  Result Date: 06/11/2023 CLINICAL DATA:  Rib pain, fall left-sided rib pain under the breast with inspiration EXAM: CHEST - 2 VIEW COMPARISON:  05/28/2019, CT 07/13/2019 FINDINGS: Post sternotomy changes and valve prosthesis. Enlarged cardiomediastinal silhouette with probable mild vascular congestion. Mild chronic pleural thickening on the right. Age indeterminate left lower ninth and tenth anterolateral rib deformities. Chronic upper lumbar compression deformity. IMPRESSION: 1. Age  indeterminate left lower ninth and tenth anterolateral rib deformities. 2. Enlarged cardiomediastinal silhouette with probable mild vascular congestion. Electronically Signed   By: Jasmine Pang M.D.   On: 06/11/2023 15:55    Microbiology: Results for orders placed or performed during the hospital encounter of 05/28/19  SARS Coronavirus 2 [facility] order, Performed in [facility] hospital lab) Nasopharyngeal Nasopharyngeal Swab     Status: None   Collection Time: 05/28/19 10:49 PM   Specimen: Nasopharyngeal Swab  Result Value Ref Range Status   SARS Coronavirus 2 NEGATIVE NEGATIVE Final    Comment: (NOTE) If result is NEGATIVE SARS-CoV-2 target nucleic acids are NOT DETECTED. The SARS-CoV-2 RNA is generally detectable in upper and lower  respiratory specimens during the acute phase of infection. The lowest  concentration of SARS-CoV-2 viral copies this assay can detect is 250  copies / mL. A negative result does not preclude SARS-CoV-2 infection  and should not be used as the sole basis for treatment or other  patient management decisions.  A negative result may occur with  improper specimen collection / handling, submission of specimen other  than nasopharyngeal swab, presence of viral mutation(s) within the  areas targeted by this assay, and inadequate number of viral copies  (<250 copies / mL). A negative result must be combined with clinical  observations, patient history, and epidemiological information. If result is POSITIVE SARS-CoV-2 target nucleic acids are DETECTED. The SARS-CoV-2 RNA is generally detectable in upper and lower  respiratory specimens dur ing the acute phase of infection.  Positive  results are indicative of active infection with SARS-CoV-2.  Clinical  correlation with patient history and other diagnostic information is  necessary to determine patient infection status.  Positive results do  not rule out bacterial infection or co-infection with other viruses. If  result is PRESUMPTIVE POSTIVE SARS-CoV-2 nucleic acids MAY BE PRESENT.   A presumptive positive result was obtained on the submitted specimen  and confirmed on repeat testing.  While 2019 novel coronavirus  (SARS-CoV-2) nucleic acids may be present in the submitted sample  additional confirmatory testing may be necessary for epidemiological  and / or clinical management purposes  to differentiate between  SARS-CoV-2 and other Sarbecovirus currently known to infect humans.  If clinically indicated additional testing with an alternate test  methodology [phone]) is advised. The SARS-CoV-2 RNA is generally  detectable in upper and lower respiratory sp ecimens during the acute  phase of infection. The expected result is Negative. Fact Sheet for Patients:  [employer] Fact Sheet for Healthcare Providers: https://pope.com/ This test is not yet approved or cleared by the [place] FDA and has been authorized for detection and/or diagnosis of SARS-CoV-2 by FDA under an Emergency Use Authorization (EUA).  This EUA will remain in effect (meaning this test can be used) for the duration of the COVID-19 declaration under Section 564(b)(1) of the Act, 21 U.S.C. section 360bbb-3(b)(1), unless the authorization is terminated or revoked sooner. Performed at [facility], [address] [place], [place] [address]     Labs: CBC: Recent Labs  Lab 06/20/23 [phone] 06/21/23 0444 06/22/23 0515 06/23/23 0538 06/24/23 0444  WBC 7.0 6.7 5.4 5.2 5.4  HGB 8.3* 7.5* 7.2* 6.8* 8.2*  HCT 26.7* 24.5* 23.5* 22.4* 25.9*  MCV 93.0 92.8 94.4 94.5 90.9  PLT 183 164 175 209 232   Basic Metabolic Panel: Recent Labs  Lab 06/19/23 0447 06/21/23 0906 06/23/23 0847  NA 131* 128* 129*  K 3.9 5.3* 4.5  CL 98 95* 93*  CO2 27 27 30   GLUCOSE 99 130* 92  BUN 12 12 13   CREATININE 1.16* 0.99 0.94  CALCIUM 8.4* 8.1* 8.2*   Liver Function Tests: No results  for input(s): "AST", "ALT", "ALKPHOS", "BILITOT", "PROT", "ALBUMIN" in the last 168 hours. CBG: No results for input(s): "GLUCAP" in the last 168 hours.  Discharge time spent: greater than 30 minutes.  Signed: Catarina Hartshorn, MD [facility] 06/24/2023

## 2023-06-24 NOTE — Progress Notes (Signed)
Physical Therapy Treatment Patient Details Name: [name] MRN: [id number] DOB: 02-Oct-1951 Today's Date: 06/24/2023   History of Present Illness [name] is a 72 y.o. female with medical history significant for diastolic CHF, chronic respiratory failure on 2 L, atrial fibrillation, COPD.  Patient presented to the ED with complaints of a fall with left-sided chest and knee pain.  Patient got up onto a counter to clean when she lost her balance and fell in the bathroom, hitting the left side of her chest and her right leg following into the commode.  It took about 30 minutes to crawl to her phone to call her son.  She denies any chest pain, no dizziness, no change in breathing.  He is on warfarin and took her last dose this morning.  She is also on diuretics-torsemide    PT Comments  Pt tolerated today's treatment session, however continues to need heavy motivation to increase tolerance and reduced assistance for mobility. Fear of falling. Today's session addressed functional transfer practice with guided multimodal cues for improved carryover. Pt noted with continued assistance for sit/stands with reduced attention to WB status due to fear of following. Progressing with tolerance to multiple transfers but continues with limitations in transfers due to muscle weakness. Pt would continue to benefit from skilled acute physical therapy services in order to progress toward POC goals, safety/independence with functional mobility and QOL.     If plan is discharge home, recommend the following: A lot of help with bathing/dressing/bathroom;A lot of help with walking and/or transfers;Help with stairs or ramp for entrance;Assistance with cooking/housework   Can travel by private vehicle     No  Equipment Recommendations  None recommended by PT    Recommendations for Other Services       Precautions / Restrictions Restrictions Weight Bearing Restrictions: No RLE Weight Bearing: Non weight  bearing     Mobility  Bed Mobility Overal bed mobility: Needs Assistance Bed Mobility: Supine to Sit     Supine to sit: Mod assist     General bed mobility comments: mod assist with hand over hand cues and encouragement to complete with less assist. cues for hand placement on rails and elevate trunk, needs assit for trunk elevation through 25% of movement. Patient Response: Anxious  Transfers Overall transfer level: Needs assistance Equipment used: Rolling walker (2 wheels) Transfers: Sit to/from Stand Sit to Stand: Mod assist Stand pivot transfers: Mod assist, Min assist Step pivot transfers: Mod assist       General transfer comment: 5x sit/stand practice from EOB. consistent cues given for anterior weight shift and hand placement on RW. Pt with poor carryover for anterior weight shift and and progression into standing. mod assist provided with last attempt being min assist to power to stand. Tactile cues with therapist hold pt's RLE on foot to maintain WB status. Consistent encouragement needed throughout trials to improve tolerance for standing.    Ambulation/Gait Ambulation/Gait assistance: Mod assist Gait Distance (Feet): 4 Feet Assistive device: Rolling walker (2 wheels) Gait Pattern/deviations: Decreased step length - right, Decreased step length - left, Decreased stride length, Shuffle Gait velocity: slow     General Gait Details: limited to a few shuffling steps on LLE with fair/good return for keeping RLE off floor, limited mostly due to fatigue and reports of left knee buckling. Improved carry over of progression of RW management this session. guided tactile cues with therapist's foot under RLE.   Stairs  Wheelchair Mobility     Tilt Bed Tilt Bed Patient Response: Anxious  Modified Rankin (Stroke Patients Only)       Balance Overall balance assessment: Needs assistance Sitting-balance support: Feet supported, No upper extremity  supported Sitting balance-Leahy Scale: Fair Sitting balance - Comments: fair/good seated at EOB   Standing balance support: Bilateral upper extremity supported, During functional activity, Reliant on assistive device for balance Standing balance-Leahy Scale: Poor Standing balance comment: fair/poor using RW. Heavy UE support on RW.                            Cognition Arousal: Alert Behavior During Therapy: WFL for tasks assessed/performed Overall Cognitive Status: Within Functional Limits for tasks assessed                                          Exercises General Exercises - Lower Extremity Ankle Circles/Pumps: Seated, AROM, Strengthening, Both, 15 reps Mini-Sqauts: Strengthening, 5 reps, Seated Other Exercises Other Exercises: pt instructed for pillow under distal calf for knee extension stretch hold for 1-2 minutes daily.    General Comments        Pertinent Vitals/Pain Pain Assessment Pain Assessment: Faces Faces Pain Scale: Hurts even more Pain Location: RLE Pain Descriptors / Indicators: Discomfort, Sore, Grimacing, Guarding Pain Intervention(s): Limited activity within patient's tolerance, Monitored during session, Repositioned, Relaxation    Home Living                          Prior Function            PT Goals (current goals can now be found in the care plan section) Acute Rehab PT Goals Patient Stated Goal: To be able to walk again PT Goal Formulation: With patient Time For Goal Achievement: 07/10/23 Potential to Achieve Goals: Good    Frequency    Min 3X/week      PT Plan      Co-evaluation              AM-PAC PT "6 Clicks" Mobility   Outcome Measure  Help needed turning from your back to your side while in a flat bed without using bedrails?: A Lot Help needed moving from lying on your back to sitting on the side of a flat bed without using bedrails?: A Lot Help needed moving to and from a bed  to a chair (including a wheelchair)?: A Lot Help needed standing up from a chair using your arms (e.g., wheelchair or bedside chair)?: A Lot Help needed to walk in hospital room?: A Lot Help needed climbing 3-5 steps with a railing? : Total 6 Click Score: 11    End of Session Equipment Utilized During Treatment: Oxygen Activity Tolerance: Patient tolerated treatment well;Patient limited by fatigue Patient left: in chair;with call bell/phone within reach;with chair alarm set Nurse Communication: Mobility status PT Visit Diagnosis: Unsteadiness on feet (R26.81);Other abnormalities of gait and mobility (R26.89);Muscle weakness (generalized) (M62.81) Pain - Right/Left: Right Pain - part of body: Knee     Time: 1047-1130 PT Time Calculation (min) (ACUTE ONLY): 43 min  Charges:    $Therapeutic Activity: 38-52 mins PT General Charges $$ ACUTE PT VISIT: 1 Visit                     [name]  Zigmund Daniel PT, DPT Physical Therapist with [facility] 336 [phone] office    Nelida Meuse 06/24/2023, 12:00 PM

## 2023-06-24 NOTE — Progress Notes (Signed)
Pt discharged to snf via EMS in stable condition.

## 2023-06-24 NOTE — Hospital Course (Signed)
72 y.o. 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  on 8/22 following a fall with left-sided chest and right knee pain. Admitted for nondisplaced left anterior 4-7 rib fractures, comminuted displaced fracture involving the right lateral tibial plateau. Orthopedics consulted and plan ORIF on 06/18/2023. Cardiology consulted and provided preop clearance.  Her postoperative course was prolonged secondary to blood loss anemia.  She was transfused 1 unit PRBC.  PT saw the patient and recommended skilled nursing facility with which the patient agreed.

## 2023-06-24 NOTE — TOC Transition Note (Signed)
Transition of Care [facility]) - CM/SW Discharge Note   Patient Details  Name: [name] MRN: [id number] Date of Birth: June 10, 1951  Transition of Care [facility]) CM/SW Contact:  Leitha Bleak, RN Phone Number: 06/24/2023, 11:00 AM   Clinical Narrative:   Insurance Auth confirmed, patient needs to admit to [facility] today. Destiny provided room number 137-1. RN will call report, Med necessity printed, CM updated her son, [name]. TOC will call EMS when Rn is ready. DC summay sent to [facility].    Final next level of care: Skilled Nursing Facility Barriers to Discharge: Barriers Resolved   Patient Goals and CMS Choice     Discharge Placement               Patient to be transferred to facility by: EMS Name of family member notified: Son - [name] Patient and family notified of of transfer: 06/24/23  Discharge Plan and Services Additional resources added to the After Visit Summary for             Social Determinants of Health (SDOH) Interventions SDOH Screenings   Food Insecurity: No Food Insecurity (06/11/2023)  Housing: Low Risk  (06/11/2023)  Transportation Needs: No Transportation Needs (06/11/2023)  Utilities: Not At Risk (06/11/2023)  Financial Resource Strain: Low Risk  (07/28/2018)  Physical Activity: Inactive (07/28/2018)  Social Connections: Somewhat Isolated (07/28/2018)  Stress: No Stress Concern Present (07/28/2018)  Tobacco Use: Medium Risk (06/18/2023)    Readmission Risk Interventions     No data to display

## 2023-07-01 ENCOUNTER — Other Ambulatory Visit (INDEPENDENT_AMBULATORY_CARE_PROVIDER_SITE_OTHER): Payer: Medicare Other

## 2023-07-01 ENCOUNTER — Encounter: Payer: Self-pay | Admitting: Orthopedic Surgery

## 2023-07-01 ENCOUNTER — Ambulatory Visit (INDEPENDENT_AMBULATORY_CARE_PROVIDER_SITE_OTHER): Payer: Medicare Other | Admitting: Orthopedic Surgery

## 2023-07-01 DIAGNOSIS — Z9889 Other specified postprocedural states: Secondary | ICD-10-CM

## 2023-07-01 DIAGNOSIS — Z8781 Personal history of (healed) traumatic fracture: Secondary | ICD-10-CM

## 2023-07-01 NOTE — Patient Instructions (Signed)
Continue with regular wound care.  Blisters will decompress, and they need to be covered.  Nothing else is needed.  Okay to remove the knee immobilizer to work on range of motion of the right knee.  Continue with nonweightbearing.  If she is ambulating, she should be wearing the knee immobilizer.  She has a productive cough in clinic today.  Concerning for her breathing issues.  Consider antibiotics if concern for pneumonia.  Medications as needed.  Follow-up in 2 weeks.

## 2023-07-01 NOTE — Progress Notes (Signed)
Orthopaedic Postop Note  Assessment: [name] is a 72 y.o. female s/p ORIF of right tibial plateau fracture with lateral meniscus repair  DOS: 06/18/2023  Plan: [name] is stable following surgery.  Her pain is controlled.  Her blisters are healing.  Surgical incision is intact.  She does have some serous drainage.  She tolerates gentle range of motion.  Radiographs are stable.  Okay to remove the knee immobilizer to work on knee range of motion.  She is to remain nonweightbearing.  She will wear the knee immobilizer when she is out of bed.  More concerning was her breathing in clinic today, as well as productive cough.  Hopefully this does not persist, but she may need to be treated for potential pneumonia.   Follow-up: Return in about 2 weeks (around 07/15/2023). XR at next visit: Right knee  Subjective:  Chief Complaint  Patient presents with   Routine Post Op    R ORIF DOS 06/18/23    History of Present Illness: [name] is a 72 y.o. female who presents following the above stated procedure.  Surgery was approximately 2 weeks ago.  She is staying at [facility] nursing facility.  Overall, she is doing well.  Her pain is controlled.  No continued drainage from the incision.  She continues to have blisters.  She is not bearing weight on her right leg.  She is using the knee immobilizer.  She has a productive cough.  No fevers or chills.  Review of Systems: No fevers or chills No numbness or tingling No Chest Pain + Shortness of breath   Objective: There were no vitals taken for this visit.  Physical Exam:  Alert and oriented.  No acute distress.  Nasal cannula in place.  Persistent cough.  Right leg with persistent blistering.  No erythema.  No active drainage.  No fluctuance.  Sutures are intact.  Multiple blisters remain.  Tolerates gentle range of motion.  Toes warm and well-perfused.  IMAGING: I personally ordered and reviewed the following  images:  AP and lateral x-rays of the right knee were obtained in clinic today.  Right lateral tibial plateau depression fracture remains in stable alignment following operative fixation.  Screws have not backed out.  The plate remains in a good position.  There has been no subsidence at the fracture site.  No bony lesions.  Impression: Stable right lateral tibial plateau fracture following operative fixation   Oliver Barre, MD 07/01/2023 1:23 PM

## 2023-07-15 ENCOUNTER — Encounter: Payer: Self-pay | Admitting: Orthopedic Surgery

## 2023-07-15 ENCOUNTER — Ambulatory Visit (INDEPENDENT_AMBULATORY_CARE_PROVIDER_SITE_OTHER): Payer: Medicare Other | Admitting: Orthopedic Surgery

## 2023-07-15 ENCOUNTER — Other Ambulatory Visit (INDEPENDENT_AMBULATORY_CARE_PROVIDER_SITE_OTHER): Payer: Medicare Other

## 2023-07-15 DIAGNOSIS — Z9889 Other specified postprocedural states: Secondary | ICD-10-CM | POA: Diagnosis not present

## 2023-07-15 DIAGNOSIS — Z8781 Personal history of (healed) traumatic fracture: Secondary | ICD-10-CM

## 2023-07-15 NOTE — Progress Notes (Signed)
Orthopaedic Postop Note  Assessment: [name] is a 72 y.o. female s/p ORIF of right tibial plateau fracture with lateral meniscus repair  DOS: 06/18/2023  Plan: [name] continues to recover following surgery.  Her incision, as well as associated blistering and wounds are healing appropriately.  They are drying up.  She has good range of motion of the right knee.  She is short of full extension, and I stressed the importance of working on full extension.  Continue with nonweightbearing.  Okay to clean the leg, but do not submerge.  No scrubbing, and pat the incisions dry.  Would like see her back in 2 weeks, at which time anticipate that we can start allowing her to advance her weightbearing.  Still have concerns about her breathing.  Unclear if she is being treated for pneumonia.  No new issues in clinic today.   Follow-up: No follow-ups on file. XR at next visit: Right knee  Subjective:  Chief Complaint  Patient presents with   Post-op Follow-up    ORIF / improving but has on immobilizer very low.     History of Present Illness: [name] is a 72 y.o. female who presents following the above stated procedure.  Surgery was approximately 4 weeks ago.  She remains in a nursing facility.  She has just recently started therapy.  She has been working on some range of motion.  She has been wearing a knee immobilizer, but has not been wearing it appropriately.  She continues to have issues with her breathing.  She has a productive cough.   Review of Systems: No fevers or chills No numbness or tingling No Chest Pain + Shortness of breath   Objective: There were no vitals taken for this visit.  Physical Exam:  Alert and oriented.  No acute distress.  Nasal cannula in place.  Persistent cough.  Right leg surgical incision is healing.  Surrounding blisters are also healing.  There is no drainage.  All blisters have been decompressed.  Some residual swelling of the  right knee.  Range of motion from 10-90 degrees.  IMAGING: I personally ordered and reviewed the following images:  AP and lateral x-rays of the right knee were obtained in clinic today.  These are compared to prior x-rays.  There has been no interval displacement of the lateral plateau fracture.  Hardware remains intact.  No screws are backing out.  No additional injuries.  No bony lesions.  Impression: Stable lateral tibial plateau fracture without hardware failure  Oliver Barre, MD 07/15/2023 10:31 AM

## 2023-07-29 ENCOUNTER — Encounter: Payer: Medicare Other | Admitting: Orthopedic Surgery

## 2023-07-31 ENCOUNTER — Other Ambulatory Visit: Payer: Self-pay | Admitting: Orthopedic Surgery

## 2023-07-31 ENCOUNTER — Other Ambulatory Visit (INDEPENDENT_AMBULATORY_CARE_PROVIDER_SITE_OTHER): Payer: Medicare Other

## 2023-07-31 ENCOUNTER — Encounter: Payer: Self-pay | Admitting: Orthopedic Surgery

## 2023-07-31 ENCOUNTER — Ambulatory Visit: Payer: Medicare Other | Admitting: Orthopedic Surgery

## 2023-07-31 DIAGNOSIS — Z9889 Other specified postprocedural states: Secondary | ICD-10-CM

## 2023-07-31 DIAGNOSIS — Z8781 Personal history of (healed) traumatic fracture: Secondary | ICD-10-CM | POA: Diagnosis not present

## 2023-07-31 DIAGNOSIS — S82141D Displaced bicondylar fracture of right tibia, subsequent encounter for closed fracture with routine healing: Secondary | ICD-10-CM

## 2023-07-31 NOTE — Progress Notes (Signed)
Orthopaedic Postop Note  Assessment: [name] is a 72 y.o. female s/p ORIF of right tibial plateau fracture with lateral meniscus repair  DOS: 06/18/2023  Plan: [name] is doing well.  She has returned home.  Her incisions and blistering are almost completely healed.  She does have some residual loss of extension.  Demonstrated appropriate activities for her to work on it, specifically to gain extension.  Continue working on flexion.  Okay to advance weightbearing as tolerated.  Recommend she use a walker.  She states understanding.  Would like to see her back in about a month.   Follow-up: Return in about 4 weeks (around 08/28/2023). XR at next visit: Right knee  Subjective:  Chief Complaint  Patient presents with   Routine Post Op    R Knee ORIF DOS: 06/18/2023     History of Present Illness: [name] is a 72 y.o. female who presents following the above stated procedure.  Surgery was 6 weeks ago.  As she has recently been discharged from a nursing facility.  She is now back at home.  She feels well overall.  She has not started putting weight on her leg.  She has been working on some range of motion.  She has been wearing a brace.  She is pleased with the healing of her incision, as well as the blistering.  Thing is much better.  Review of Systems: No fevers or chills No numbness or tingling No Chest Pain No Shortness of breath   Objective: There were no vitals taken for this visit.  Physical Exam:  Alert and oriented.  No acute distress.  Nasal cannula in place.  Persistent cough.  Right leg surgical incision is healed.  No surrounding erythema or drainage.  The surrounding blisters have all been decompressed, and are improving.  There is some raw appearing skin.  No fluctuance.  No point tenderness.  She tolerates flexion to 100 degrees.  Extension is 10 degrees short of full.  IMAGING: I personally ordered and reviewed the following images:  AP and  lateral x-rays of the right knee were obtained in clinic today.  These are compared to prior x-rays.  No interval displacement of the lateral tibial plateau fracture.  Hardware remains unchanged.  Screws are intact.  There is been no subsidence.  No complications.  Impression: Stable right tibial plateau fracture without hardware failure  Oliver Barre, MD 07/31/2023 10:01 AM

## 2023-07-31 NOTE — Patient Instructions (Signed)
OK to proceed with weight bearing as tolerated.  Use a walker to assist with ambulation.   Focus on achieving full extension.   Sit with your leg extended and something underneath your heel, as demonstrated in clinic today  Follow up in 1 month

## 2023-08-26 ENCOUNTER — Other Ambulatory Visit (INDEPENDENT_AMBULATORY_CARE_PROVIDER_SITE_OTHER): Payer: Medicare Other

## 2023-08-26 ENCOUNTER — Ambulatory Visit (INDEPENDENT_AMBULATORY_CARE_PROVIDER_SITE_OTHER): Payer: Medicare Other | Admitting: Orthopedic Surgery

## 2023-08-26 DIAGNOSIS — Z9889 Other specified postprocedural states: Secondary | ICD-10-CM | POA: Diagnosis not present

## 2023-08-26 DIAGNOSIS — Z8781 Personal history of (healed) traumatic fracture: Secondary | ICD-10-CM

## 2023-08-26 NOTE — Patient Instructions (Signed)
Use a walker to help when walking  Walking is good therapy  Continue to work with PT/OT

## 2023-08-27 ENCOUNTER — Encounter: Payer: Self-pay | Admitting: Orthopedic Surgery

## 2023-08-27 NOTE — Progress Notes (Signed)
Orthopaedic Postop Note  Assessment: [name] is a 72 y.o. female s/p ORIF of right tibial plateau fracture with lateral meniscus repair  DOS: 06/18/2023  Plan: [name] is doing well with her right knee.  Pain is controlled.  No issues with the incision.  She is pregnant range of motion, lacking a little bit of extension.  Urged her to continue ambulating, with the assistance of a walker.  Continue to work on range of motion.  Medications as needed.  I did express concerns about her breathing, and her son stated that they have communicated with her PCP, and OT is coming to the house tomorrow for further evaluation.  He stated that they are aware, should something happen they need to present to the emergency department.   Follow-up: Return in about 6 weeks (around 10/07/2023). XR at next visit: Right knee  Subjective:  Chief Complaint  Patient presents with   Routine Post Op    R tib ORIF DOS: 06/18/2023    History of Present Illness: [name] is a 72 y.o. female who presents following the above stated procedure.  Surgery was 10 weeks ago.  Regarding her right knee, she is doing well.  Pain is controlled.  She is ambulating some within her house.  She has good range of motion of the right knee.  She continues to have difficulty with breathing, as her oxygen is currently above her usual level.  They have been communicating with her PCP.  PT and OT continue to come to the house.  Review of Systems: No fevers or chills No numbness or tingling No Chest Pain No Shortness of breath   Objective: There were no vitals taken for this visit.  Physical Exam:  Alert and oriented.  No acute distress.  Nasal cannula in place, on 4 L of O2.  Right leg surgical incision is healed.  No surrounding erythema or drainage.  Surrounding blisters have healed.  They continue to improve in appearance overall.  She has banged her leg in a couple of spots, which created some healthy  appearing lesions.  No fluctuance.  No point tenderness.  She tolerates flexion to 100 degrees.  Extension is 8 degrees short of full.  IMAGING: I personally ordered and reviewed the following images:  AP and lateral x-rays of the right knee were obtained in clinic.  These are compared to previous x-rays.  Lateral plate and screws remain in stable position.  Screws are not backing out.  The lateral plateau has not subsided.  Fracture reduction remains stable.  Neutral overall alignment.  No periprosthetic lucency.  Impression: Right tibial plateau fracture in stable alignment without hardware failure   Oliver Barre, MD 08/27/2023 8:48 AM

## 2023-09-09 ENCOUNTER — Other Ambulatory Visit: Payer: Self-pay

## 2023-09-09 ENCOUNTER — Encounter (HOSPITAL_COMMUNITY): Payer: Self-pay

## 2023-09-09 ENCOUNTER — Emergency Department (HOSPITAL_COMMUNITY): Payer: Medicare Other

## 2023-09-09 ENCOUNTER — Inpatient Hospital Stay (HOSPITAL_COMMUNITY)
Admission: EM | Admit: 2023-09-09 | Discharge: 2023-09-23 | DRG: 291 | Disposition: A | Discharge status: Skilled Nursing Facility | Payer: Medicare Other | Attending: Family Medicine | Admitting: Family Medicine

## 2023-09-09 DIAGNOSIS — E8729 Other acidosis: Secondary | ICD-10-CM | POA: Diagnosis not present

## 2023-09-09 DIAGNOSIS — J9621 Acute and chronic respiratory failure with hypoxia: Secondary | ICD-10-CM | POA: Diagnosis present

## 2023-09-09 DIAGNOSIS — I2510 Atherosclerotic heart disease of native coronary artery without angina pectoris: Secondary | ICD-10-CM | POA: Diagnosis present

## 2023-09-09 DIAGNOSIS — E039 Hypothyroidism, unspecified: Secondary | ICD-10-CM

## 2023-09-09 DIAGNOSIS — Z951 Presence of aortocoronary bypass graft: Secondary | ICD-10-CM

## 2023-09-09 DIAGNOSIS — I2722 Pulmonary hypertension due to left heart disease: Secondary | ICD-10-CM | POA: Diagnosis present

## 2023-09-09 DIAGNOSIS — Z823 Family history of stroke: Secondary | ICD-10-CM

## 2023-09-09 DIAGNOSIS — Z9981 Dependence on supplemental oxygen: Secondary | ICD-10-CM

## 2023-09-09 DIAGNOSIS — I482 Chronic atrial fibrillation, unspecified: Secondary | ICD-10-CM | POA: Diagnosis not present

## 2023-09-09 DIAGNOSIS — R791 Abnormal coagulation profile: Secondary | ICD-10-CM | POA: Diagnosis present

## 2023-09-09 DIAGNOSIS — Z7189 Other specified counseling: Secondary | ICD-10-CM | POA: Diagnosis not present

## 2023-09-09 DIAGNOSIS — J441 Chronic obstructive pulmonary disease with (acute) exacerbation: Secondary | ICD-10-CM

## 2023-09-09 DIAGNOSIS — I2723 Pulmonary hypertension due to lung diseases and hypoxia: Secondary | ICD-10-CM | POA: Diagnosis present

## 2023-09-09 DIAGNOSIS — I4821 Permanent atrial fibrillation: Secondary | ICD-10-CM | POA: Diagnosis present

## 2023-09-09 DIAGNOSIS — Z90710 Acquired absence of both cervix and uterus: Secondary | ICD-10-CM

## 2023-09-09 DIAGNOSIS — N179 Acute kidney failure, unspecified: Secondary | ICD-10-CM | POA: Diagnosis present

## 2023-09-09 DIAGNOSIS — E440 Moderate protein-calorie malnutrition: Secondary | ICD-10-CM | POA: Diagnosis present

## 2023-09-09 DIAGNOSIS — W19XXXD Unspecified fall, subsequent encounter: Secondary | ICD-10-CM | POA: Diagnosis present

## 2023-09-09 DIAGNOSIS — J9622 Acute and chronic respiratory failure with hypercapnia: Secondary | ICD-10-CM | POA: Diagnosis present

## 2023-09-09 DIAGNOSIS — Z7901 Long term (current) use of anticoagulants: Secondary | ICD-10-CM

## 2023-09-09 DIAGNOSIS — N1831 Chronic kidney disease, stage 3a: Secondary | ICD-10-CM | POA: Diagnosis present

## 2023-09-09 DIAGNOSIS — I872 Venous insufficiency (chronic) (peripheral): Secondary | ICD-10-CM | POA: Diagnosis present

## 2023-09-09 DIAGNOSIS — N6452 Nipple discharge: Secondary | ICD-10-CM | POA: Diagnosis present

## 2023-09-09 DIAGNOSIS — I10 Essential (primary) hypertension: Secondary | ICD-10-CM | POA: Diagnosis not present

## 2023-09-09 DIAGNOSIS — Z860101 Personal history of adenomatous and serrated colon polyps: Secondary | ICD-10-CM

## 2023-09-09 DIAGNOSIS — S82201A Unspecified fracture of shaft of right tibia, initial encounter for closed fracture: Secondary | ICD-10-CM | POA: Diagnosis present

## 2023-09-09 DIAGNOSIS — Z66 Do not resuscitate: Secondary | ICD-10-CM | POA: Diagnosis not present

## 2023-09-09 DIAGNOSIS — G2581 Restless legs syndrome: Secondary | ICD-10-CM | POA: Diagnosis present

## 2023-09-09 DIAGNOSIS — N649 Disorder of breast, unspecified: Secondary | ICD-10-CM | POA: Diagnosis present

## 2023-09-09 DIAGNOSIS — Z952 Presence of prosthetic heart valve: Secondary | ICD-10-CM

## 2023-09-09 DIAGNOSIS — I5033 Acute on chronic diastolic (congestive) heart failure: Secondary | ICD-10-CM

## 2023-09-09 DIAGNOSIS — H9190 Unspecified hearing loss, unspecified ear: Secondary | ICD-10-CM | POA: Diagnosis present

## 2023-09-09 DIAGNOSIS — E785 Hyperlipidemia, unspecified: Secondary | ICD-10-CM

## 2023-09-09 DIAGNOSIS — Z880 Allergy status to penicillin: Secondary | ICD-10-CM

## 2023-09-09 DIAGNOSIS — I878 Other specified disorders of veins: Secondary | ICD-10-CM | POA: Diagnosis present

## 2023-09-09 DIAGNOSIS — R54 Age-related physical debility: Secondary | ICD-10-CM | POA: Diagnosis present

## 2023-09-09 DIAGNOSIS — Z79899 Other long term (current) drug therapy: Secondary | ICD-10-CM

## 2023-09-09 DIAGNOSIS — N183 Chronic kidney disease, stage 3 unspecified: Secondary | ICD-10-CM | POA: Diagnosis present

## 2023-09-09 DIAGNOSIS — Z1152 Encounter for screening for COVID-19: Secondary | ICD-10-CM | POA: Diagnosis not present

## 2023-09-09 DIAGNOSIS — R0902 Hypoxemia: Principal | ICD-10-CM

## 2023-09-09 DIAGNOSIS — Z6823 Body mass index (BMI) 23.0-23.9, adult: Secondary | ICD-10-CM

## 2023-09-09 DIAGNOSIS — N1832 Chronic kidney disease, stage 3b: Secondary | ICD-10-CM

## 2023-09-09 DIAGNOSIS — I130 Hypertensive heart and chronic kidney disease with heart failure and stage 1 through stage 4 chronic kidney disease, or unspecified chronic kidney disease: Secondary | ICD-10-CM | POA: Diagnosis present

## 2023-09-09 DIAGNOSIS — R1310 Dysphagia, unspecified: Secondary | ICD-10-CM | POA: Diagnosis present

## 2023-09-09 DIAGNOSIS — K2100 Gastro-esophageal reflux disease with esophagitis, without bleeding: Secondary | ICD-10-CM

## 2023-09-09 DIAGNOSIS — K219 Gastro-esophageal reflux disease without esophagitis: Secondary | ICD-10-CM | POA: Diagnosis present

## 2023-09-09 DIAGNOSIS — Z515 Encounter for palliative care: Secondary | ICD-10-CM

## 2023-09-09 DIAGNOSIS — R0602 Shortness of breath: Secondary | ICD-10-CM

## 2023-09-09 DIAGNOSIS — I5031 Acute diastolic (congestive) heart failure: Secondary | ICD-10-CM | POA: Diagnosis not present

## 2023-09-09 DIAGNOSIS — Z87891 Personal history of nicotine dependence: Secondary | ICD-10-CM

## 2023-09-09 DIAGNOSIS — I509 Heart failure, unspecified: Secondary | ICD-10-CM

## 2023-09-09 DIAGNOSIS — Z79890 Hormone replacement therapy (postmenopausal): Secondary | ICD-10-CM

## 2023-09-09 DIAGNOSIS — S82201D Unspecified fracture of shaft of right tibia, subsequent encounter for closed fracture with routine healing: Secondary | ICD-10-CM

## 2023-09-09 LAB — CBC WITH DIFFERENTIAL/PLATELET
Abs Immature Granulocytes: 0.04 10*3/uL (ref 0.00–0.07)
Basophils Absolute: 0 10*3/uL (ref 0.0–0.1)
Basophils Relative: 0 %
Eosinophils Absolute: 0 10*3/uL (ref 0.0–0.5)
Eosinophils Relative: 0 %
HCT: 33.7 % — ABNORMAL LOW (ref 36.0–46.0)
Hemoglobin: 10.4 g/dL — ABNORMAL LOW (ref 12.0–15.0)
Immature Granulocytes: 1 %
Lymphocytes Relative: 8 %
Lymphs Abs: 0.5 10*3/uL — ABNORMAL LOW (ref 0.7–4.0)
MCH: 28.1 pg (ref 26.0–34.0)
MCHC: 30.9 g/dL (ref 30.0–36.0)
MCV: 91.1 fL (ref 80.0–100.0)
Monocytes Absolute: 0.2 10*3/uL (ref 0.1–1.0)
Monocytes Relative: 4 %
Neutro Abs: 5.3 10*3/uL (ref 1.7–7.7)
Neutrophils Relative %: 87 %
Platelets: 191 10*3/uL (ref 150–400)
RBC: 3.7 MIL/uL — ABNORMAL LOW (ref 3.87–5.11)
RDW: 16 % — ABNORMAL HIGH (ref 11.5–15.5)
WBC: 6.1 10*3/uL (ref 4.0–10.5)
nRBC: 0 % (ref 0.0–0.2)

## 2023-09-09 LAB — PROCALCITONIN: Procalcitonin: <0.1 ng/mL

## 2023-09-09 LAB — TROPONIN I (HIGH SENSITIVITY)
Troponin I (High Sensitivity): 19 ng/L — ABNORMAL HIGH (ref <18)
Troponin I (High Sensitivity): 20 ng/L — ABNORMAL HIGH (ref <18)

## 2023-09-09 LAB — BASIC METABOLIC PANEL
Anion gap: 13 (ref 5–15)
BUN: 14 mg/dL (ref 8–23)
CO2: 32 mmol/L (ref 22–32)
Calcium: 9.1 mg/dL (ref 8.9–10.3)
Chloride: 87 mmol/L — ABNORMAL LOW (ref 98–111)
Creatinine, Ser: 1.17 mg/dL — ABNORMAL HIGH (ref 0.44–1.00)
GFR, Estimated: 50 mL/min — ABNORMAL LOW (ref >60)
Glucose, Bld: 115 mg/dL — ABNORMAL HIGH (ref 70–99)
Potassium: 3.5 mmol/L (ref 3.5–5.1)
Sodium: 132 mmol/L — ABNORMAL LOW (ref 135–145)

## 2023-09-09 LAB — BLOOD GAS, VENOUS
Acid-Base Excess: 6.8 mmol/L — ABNORMAL HIGH (ref 0.0–2.0)
Bicarbonate: 35.9 mmol/L — ABNORMAL HIGH (ref 20.0–28.0)
Drawn by: 51174
O2 Saturation: 41.7 %
Patient temperature: 36.6
pCO2, Ven: 72 mm[Hg] (ref 44–60)
pH, Ven: 7.31 (ref 7.25–7.43)
pO2, Ven: <31 mm[Hg] — CL (ref 32–45)

## 2023-09-09 LAB — BLOOD GAS, ARTERIAL
Acid-Base Excess: 6.5 mmol/L — ABNORMAL HIGH (ref 0.0–2.0)
Bicarbonate: 34.2 mmol/L — ABNORMAL HIGH (ref 20.0–28.0)
Drawn by: 4197
O2 Saturation: 98.5 %
Patient temperature: 37
pCO2 arterial: 62 mm[Hg] — ABNORMAL HIGH (ref 32–48)
pH, Arterial: 7.35 (ref 7.35–7.45)
pO2, Arterial: 80 mm[Hg] — ABNORMAL LOW (ref 83–108)

## 2023-09-09 LAB — PROTIME-INR
INR: 1.5 — ABNORMAL HIGH (ref 0.8–1.2)
Prothrombin Time: 18.1 s — ABNORMAL HIGH (ref 11.4–15.2)

## 2023-09-09 LAB — MAGNESIUM: Magnesium: 2.9 mg/dL — ABNORMAL HIGH (ref 1.7–2.4)

## 2023-09-09 LAB — RESP PANEL BY RT-PCR (RSV, FLU A&B, COVID)  RVPGX2
Influenza A by PCR: NEGATIVE
Influenza B by PCR: NEGATIVE
Resp Syncytial Virus by PCR: NEGATIVE
SARS Coronavirus 2 by RT PCR: NEGATIVE

## 2023-09-09 LAB — PHOSPHORUS: Phosphorus: 3.5 mg/dL (ref 2.5–4.6)

## 2023-09-09 LAB — TSH: TSH: 2.652 u[IU]/mL (ref 0.350–4.500)

## 2023-09-09 LAB — BRAIN NATRIURETIC PEPTIDE: B Natriuretic Peptide: 849 pg/mL — ABNORMAL HIGH (ref 0.0–100.0)

## 2023-09-09 LAB — MRSA NEXT GEN BY PCR, NASAL: MRSA by PCR Next Gen: NOT DETECTED

## 2023-09-09 MED ORDER — CHLORHEXIDINE GLUCONATE CLOTH 2 % EX PADS
6.0000 | MEDICATED_PAD | Freq: Every day | CUTANEOUS | Status: DC
  Administered 2023-09-10 – 2023-09-22 (×15): 6 via TOPICAL

## 2023-09-09 MED ORDER — WARFARIN SODIUM 2 MG PO TABS
4.0000 mg | ORAL_TABLET | Freq: Once | ORAL | Status: AC
  Administered 2023-09-09: 4 mg via ORAL
  Filled 2023-09-09 (×2): qty 2

## 2023-09-09 MED ORDER — ACETAMINOPHEN 325 MG PO TABS: 650.0000 mg | ORAL_TABLET | Freq: Four times a day (QID) | ORAL | Status: DC | PRN

## 2023-09-09 MED ORDER — LEVOFLOXACIN IN D5W 750 MG/150ML IV SOLN
750.0000 mg | Freq: Once | INTRAVENOUS | Status: DC
  Filled 2023-09-09: qty 150

## 2023-09-09 MED ORDER — LEVALBUTEROL HCL 0.63 MG/3ML IN NEBU
0.6300 mg | INHALATION_SOLUTION | Freq: Once | RESPIRATORY_TRACT | Status: AC
Start: 2023-09-09 — End: 2023-09-09
  Administered 2023-09-09: 0.63 mg via RESPIRATORY_TRACT

## 2023-09-09 MED ORDER — LEVALBUTEROL HCL 0.63 MG/3ML IN NEBU
INHALATION_SOLUTION | RESPIRATORY_TRACT | Status: AC
  Filled 2023-09-09: qty 3

## 2023-09-09 MED ORDER — MIDAZOLAM HCL 2 MG/2ML IJ SOLN
2.0000 mg | Freq: Once | INTRAMUSCULAR | Status: AC
  Administered 2023-09-09: 2 mg via INTRAVENOUS
  Filled 2023-09-09: qty 2

## 2023-09-09 MED ORDER — SODIUM CHLORIDE 0.9% FLUSH
3.0000 mL | Freq: Two times a day (BID) | INTRAVENOUS | Status: DC
  Administered 2023-09-09 – 2023-09-22 (×26): 3 mL via INTRAVENOUS

## 2023-09-09 MED ORDER — ROPINIROLE HCL 1 MG PO TABS
3.0000 mg | ORAL_TABLET | Freq: Two times a day (BID) | ORAL | Status: DC
  Administered 2023-09-09 – 2023-09-23 (×27): 3 mg via ORAL
  Filled 2023-09-09 (×28): qty 3

## 2023-09-09 MED ORDER — ONDANSETRON HCL 4 MG PO TABS: 4.0000 mg | ORAL_TABLET | Freq: Four times a day (QID) | ORAL | Status: DC | PRN

## 2023-09-09 MED ORDER — MAGNESIUM SULFATE 2 GM/50ML IV SOLN
2.0000 g | Freq: Once | INTRAVENOUS | Status: AC
  Administered 2023-09-09: 2 g via INTRAVENOUS
  Filled 2023-09-09: qty 50

## 2023-09-09 MED ORDER — ONDANSETRON HCL 4 MG/2ML IJ SOLN
4.0000 mg | Freq: Four times a day (QID) | INTRAMUSCULAR | Status: DC | PRN
  Administered 2023-09-10 – 2023-09-23 (×5): 4 mg via INTRAVENOUS
  Filled 2023-09-09 (×5): qty 2

## 2023-09-09 MED ORDER — DILTIAZEM HCL ER COATED BEADS 120 MG PO CP24
120.0000 mg | ORAL_CAPSULE | Freq: Every day | ORAL | Status: DC
  Administered 2023-09-09 – 2023-09-23 (×14): 120 mg via ORAL
  Filled 2023-09-09 (×14): qty 1

## 2023-09-09 MED ORDER — ALBUTEROL SULFATE (2.5 MG/3ML) 0.083% IN NEBU
5.0000 mg | INHALATION_SOLUTION | Freq: Once | RESPIRATORY_TRACT | Status: DC
  Administered 2023-09-09: 5 mg via RESPIRATORY_TRACT
  Filled 2023-09-09: qty 6

## 2023-09-09 MED ORDER — WARFARIN - PHARMACIST DOSING INPATIENT: Freq: Every day | Status: DC

## 2023-09-09 MED ORDER — IOHEXOL 300 MG/ML  SOLN
75.0000 mL | Freq: Once | INTRAMUSCULAR | Status: AC | PRN
  Administered 2023-09-09: 75 mL via INTRAVENOUS

## 2023-09-09 MED ORDER — VANCOMYCIN HCL IN DEXTROSE 1-5 GM/200ML-% IV SOLN
1000.0000 mg | Freq: Once | INTRAVENOUS | Status: DC
  Filled 2023-09-09: qty 200

## 2023-09-09 MED ORDER — FUROSEMIDE 10 MG/ML IJ SOLN
100.0000 mg | INTRAVENOUS | Status: AC
  Administered 2023-09-09: 100 mg via INTRAVENOUS
  Filled 2023-09-09: qty 10

## 2023-09-09 MED ORDER — SALINE SPRAY 0.65 % NA SOLN
1.0000 | NASAL | Status: DC | PRN
  Administered 2023-09-10: 1 via NASAL
  Filled 2023-09-09: qty 44

## 2023-09-09 MED ORDER — ROSUVASTATIN CALCIUM 10 MG PO TABS
10.0000 mg | ORAL_TABLET | Freq: Every day | ORAL | Status: DC
  Administered 2023-09-09 – 2023-09-23 (×15): 10 mg via ORAL
  Filled 2023-09-09 (×15): qty 1

## 2023-09-09 MED ORDER — VANCOMYCIN HCL 1500 MG/300ML IV SOLN
1500.0000 mg | Freq: Once | INTRAVENOUS | Status: DC
  Filled 2023-09-09: qty 300

## 2023-09-09 MED ORDER — OXYCODONE HCL 5 MG PO TABS
10.0000 mg | ORAL_TABLET | Freq: Four times a day (QID) | ORAL | Status: DC | PRN
  Administered 2023-09-09 – 2023-09-16 (×2): 10 mg via ORAL
  Filled 2023-09-09 (×2): qty 2

## 2023-09-09 MED ORDER — DM-GUAIFENESIN ER 30-600 MG PO TB12
1.0000 | ORAL_TABLET | Freq: Two times a day (BID) | ORAL | Status: DC
  Administered 2023-09-09 – 2023-09-14 (×10): 1 via ORAL
  Filled 2023-09-09 (×11): qty 1

## 2023-09-09 MED ORDER — FUROSEMIDE 10 MG/ML IJ SOLN
40.0000 mg | Freq: Two times a day (BID) | INTRAMUSCULAR | Status: DC
  Administered 2023-09-09 – 2023-09-10 (×3): 40 mg via INTRAVENOUS
  Filled 2023-09-09 (×3): qty 4

## 2023-09-09 MED ORDER — ALBUTEROL SULFATE (2.5 MG/3ML) 0.083% IN NEBU
INHALATION_SOLUTION | RESPIRATORY_TRACT | Status: AC
  Filled 2023-09-09: qty 6

## 2023-09-09 MED ORDER — ALBUTEROL SULFATE (2.5 MG/3ML) 0.083% IN NEBU
5.0000 mg | INHALATION_SOLUTION | Freq: Once | RESPIRATORY_TRACT | Status: DC
Start: 2023-09-09 — End: 2023-09-09

## 2023-09-09 MED ORDER — BUDESONIDE 0.5 MG/2ML IN SUSP
0.5000 mg | Freq: Two times a day (BID) | RESPIRATORY_TRACT | Status: DC
  Administered 2023-09-09 – 2023-09-10 (×3): 0.5 mg via RESPIRATORY_TRACT
  Filled 2023-09-09 (×3): qty 2

## 2023-09-09 MED ORDER — LEVOTHYROXINE SODIUM 100 MCG PO TABS
50.0000 ug | ORAL_TABLET | Freq: Every day | ORAL | Status: DC
  Administered 2023-09-10 – 2023-09-23 (×13): 50 ug via ORAL
  Filled 2023-09-09 (×13): qty 1

## 2023-09-09 MED ORDER — PANTOPRAZOLE SODIUM 40 MG PO TBEC
40.0000 mg | DELAYED_RELEASE_TABLET | Freq: Every day | ORAL | Status: DC
  Administered 2023-09-09 – 2023-09-23 (×14): 40 mg via ORAL
  Filled 2023-09-09 (×15): qty 1

## 2023-09-09 MED ORDER — DOXYCYCLINE HYCLATE 100 MG PO TABS
100.0000 mg | ORAL_TABLET | Freq: Two times a day (BID) | ORAL | Status: DC
  Administered 2023-09-09 – 2023-09-16 (×13): 100 mg via ORAL
  Filled 2023-09-09 (×15): qty 1

## 2023-09-09 MED ORDER — ACETAMINOPHEN 650 MG RE SUPP: 650.0000 mg | Freq: Four times a day (QID) | RECTAL | Status: DC | PRN

## 2023-09-09 MED ORDER — ALBUTEROL SULFATE (2.5 MG/3ML) 0.083% IN NEBU
10.0000 mg/h | INHALATION_SOLUTION | Freq: Once | RESPIRATORY_TRACT | Status: AC
  Administered 2023-09-09: 10 mg/h via RESPIRATORY_TRACT
  Filled 2023-09-09: qty 12

## 2023-09-09 MED ORDER — SODIUM CHLORIDE 0.9 % IV SOLN
250.0000 mL | INTRAVENOUS | Status: AC | PRN
Start: 2023-09-09 — End: 2023-09-10

## 2023-09-09 MED ORDER — ALPRAZOLAM 0.25 MG PO TABS
0.2500 mg | ORAL_TABLET | Freq: Three times a day (TID) | ORAL | Status: DC | PRN
  Administered 2023-09-09 – 2023-09-11 (×5): 0.25 mg via ORAL
  Filled 2023-09-09 (×5): qty 1

## 2023-09-09 MED ORDER — BISOPROLOL FUMARATE 5 MG PO TABS
5.0000 mg | ORAL_TABLET | Freq: Every day | ORAL | Status: DC
  Administered 2023-09-09 – 2023-09-21 (×13): 5 mg via ORAL
  Filled 2023-09-09 (×13): qty 1

## 2023-09-09 MED ORDER — LEVALBUTEROL HCL 0.63 MG/3ML IN NEBU: 0.6300 mg | INHALATION_SOLUTION | Freq: Three times a day (TID) | RESPIRATORY_TRACT | Status: DC | PRN

## 2023-09-09 MED ORDER — SODIUM CHLORIDE 0.9% FLUSH: 3.0000 mL | INTRAVENOUS | Status: DC | PRN

## 2023-09-09 MED ORDER — IPRATROPIUM BROMIDE 0.02 % IN SOLN
0.5000 mg | Freq: Four times a day (QID) | RESPIRATORY_TRACT | Status: DC
  Administered 2023-09-09 – 2023-09-13 (×18): 0.5 mg via RESPIRATORY_TRACT
  Filled 2023-09-09 (×17): qty 2.5

## 2023-09-09 NOTE — Progress Notes (Signed)
Patient transported from ED to ICU 8 without any complications.

## 2023-09-09 NOTE — H&P (Signed)
History and Physical    Patient: [name] KGM:[id number] DOB: Nov 20, 1950 DOA: 09/09/2023 DOS: the patient was seen and examined on 09/09/2023 PCP: Rebekah Chesterfield, NP  Patient coming from: Home  Chief Complaint:  Chief Complaint  Patient presents with   Shortness of Breath   HPI: [name] is a 72 y.o. female with medical history significant of chronic diastolic heart failure, atrial fibrillation, history of aortic valve replacement (mechanical valve) chronically on Coumadin, history of COPD, chronic respiratory failure (on 2 L at baseline), gastroesophageal reflux disease, hypothyroidism, Paget's disease and restless leg syndrome; who presented to the hospital secondary to worsening shortness of breath.  Symptoms has been present for the last 3-4 days and worsening.  Family noticed decrease in care station capacity due to shortness of breath and also increased requirement for oxygen supplementation.  At home bronchodilator management has failed to improve symptoms.  Patient reports dry coughing spells, increased wheezing and decreased appetite.  No fever, no chest pain, no abdominal pain, no dysuria, no hematuria, no sick contacts, no focal weaknesses or any other complaints.  Per family at bedside patient actively seeing for positive left nipple changes and breast discharge.  Nebulizer management, IV antibiotics and IV Lasix initiated at presentation.  COVID PCR negative.  CT chest: Demonstrating moderate cardiomegaly, enlarged pulmonary arteries suggesting pulmonary hypertension and the presence of mild bilateral pleural effusion along with atelectasis/bronchitic changes on the adjacent lower lobes.  Mild bilateral pulmonary edema appreciated.  Review of Systems: As mentioned in the history of present illness. All other systems reviewed and are negative. Past Medical History:  Diagnosis Date   Anxiety disorder    Aortic stenosis    Status post St. Jude mechanical AVR  2007   Asthma    Atrial fibrillation (HCC)    Carcinoid tumor of colon 2007   Chronic diastolic heart failure (HCC)    Coronary atherosclerosis of native coronary artery    Status post CABG 2007   GERD (gastroesophageal reflux disease)    History of colonoscopy 2003   Dr. 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 Jena Gauss, benign biopsies   RLS (restless legs syndrome)    Schatzki's ring    Past Surgical History:  Procedure Laterality Date   ABDOMINAL HYSTERECTOMY     AORTIC VALVE REPLACEMENT  2007   #25 mm St. Jude mechanical prosthesis with Hemashield tube graft repair of ascending aneurysm   APPENDECTOMY  2007   BACK SURGERY     lumbar 4 and 5    BIOPSY  02/05/2012   RMR:Two tongues of salmon-colored epithelium distal esophagus, very short-segment Barrett's s/p bx/Small hiatal hernia, otherwise normal stomach, D1, D2. Status post esophageal dilation. Biopsy showed GERD.   Breast cyst removed     bilateral   BREAST REDUCTION SURGERY     CESAREAN SECTION     COLON SURGERY  01/2006   Secondary ? Appendiceal carcinoid   COLONOSCOPY  02/05/2012   UYQ:IHKVQQ rectum, sigmoid diverticulosis,descending colon polyp , tubular adenoma   CORONARY ARTERY BYPASS GRAFT     06/2006 - RIMA to RCA, SVG to RCA   ESOPHAGOGASTRODUODENOSCOPY  10/03/2010   Dr. Jena Gauss- schatzki's ring, shoft peptic stricture at GE junction.   ESOPHAGOGASTRODUODENOSCOPY (EGD) WITH PROPOFOL N/A 02/08/2018   Procedure: ESOPHAGOGASTRODUODENOSCOPY (EGD) WITH PROPOFOL;  Surgeon: Corbin Ade, MD;  Location: [facility];  Service: Endoscopy;  Laterality: N/A;  8:15am   FOOT SURGERY     bilateral bunionectomy   HERNIA REPAIR     with mesh   LAPAROTOMY  2007   small bowel resection secondary to small bowel obstruction   MALONEY DILATION  02/05/2012   Procedure: Elease Hashimoto DILATION;  Surgeon: Corbin Ade, MD;  Location: [facility];  Service:  Endoscopy;  Laterality: N/A;  56mm    MALONEY DILATION N/A 02/08/2018   Procedure: Elease Hashimoto DILATION;  Surgeon: Corbin Ade, MD;  Location: [facility];  Service: Endoscopy;  Laterality: N/A;   ORIF TIBIA PLATEAU Right 06/18/2023   Procedure: OPEN REDUCTION INTERNAL FIXATION (ORIF) TIBIAL PLATEAU;  Surgeon: Oliver Barre, MD;  Location: [facility];  Service: Orthopedics;  Laterality: Right;   Teeth removal     Social History:  reports that she quit smoking about 34 years ago. Her smoking use included cigarettes. She started smoking about 54 years ago. She has a 20 pack-year smoking history. She has never used smokeless tobacco. She reports that she does not drink alcohol and does not use drugs.  Allergies  Allergen Reactions   Penicillin G Itching and Other (See Comments)   Penicillins Hives, Itching, Swelling and Rash    Family History  Problem Relation Age of Onset   Stroke Mother    Cancer Father    Anesthesia problems Neg Hx    Hypotension Neg Hx    Malignant hyperthermia Neg Hx    Pseudochol deficiency Neg Hx     Prior to Admission medications   Medication Sig Start Date End Date Taking? Authorizing Provider  ALPRAZolam Prudy Feeler) 0.5 MG tablet Take 1 tablet (0.5 mg total) by mouth 2 (two) times daily. 06/24/23   Catarina Hartshorn, MD  bisoprolol (ZEBETA) 5 MG tablet Take 5 mg by mouth daily.    [provider]  CAPLYTA 42 MG capsule Take 42 mg by mouth daily. 02/17/22   [provider]  diltiazem (CARDIZEM CD) 120 MG 24 hr capsule Take 1 capsule (120 mg total) by mouth daily. 04/01/18   Vassie Loll, MD  FARXIGA 10 MG TABS tablet Take 10 mg by mouth daily. 01/21/21   [provider]  ipratropium (ATROVENT) 0.06 % nasal spray Place 2 sprays into both nostrils in the morning and at bedtime.    [provider]  levothyroxine (SYNTHROID, LEVOTHROID) 50 MCG tablet Take 50 mcg by mouth daily before breakfast.     [provider]  Multiple Vitamin  (MULTIVITAMIN WITH MINERALS) TABS tablet Take 1 tablet by mouth daily.    [provider]  omeprazole (PRILOSEC) 20 MG capsule Take 20 mg by mouth daily.    [provider]  oxyCODONE 10 MG TABS Take 1 tablet (10 mg total) by mouth every 4 (four) hours as needed for severe pain. 06/24/23   Catarina Hartshorn, MD  OXYGEN Inhale 2 L into the lungs daily.     [provider]  rOPINIRole (REQUIP) 3 MG tablet Take 3 mg by mouth 2 (two) times daily.    [provider]  rosuvastatin (CRESTOR) 10 MG tablet Take 10 mg by mouth daily.    [provider]  rosuvastatin (CRESTOR) 20 MG tablet TAKE ONE (1) TABLET BY MOUTH EVERY DAY 02/06/23   Jonelle Sidle, MD  torsemide (DEMADEX) 100 MG tablet Take 100 mg by mouth daily.    [provider]  warfarin (COUMADIN) 2 MG tablet Take 1.5-2 tablets (3-4 mg  total) by mouth See admin instructions. 4mg  on Tuesday and 3 mg daily the rest of the week. 09/06/18   Vassie Loll, MD    Physical Exam: Vitals:   09/09/23 0950 09/09/23 1101 09/09/23 1128 09/09/23 1157  BP:      Pulse:   (!) 106   Resp:   (!) 42   Temp:      TempSrc:      SpO2: 95%  94% 95%  Weight:  69.9 kg    Height:  5\' 8"  (1.727 m)     General exam: Alert, awake, oriented x 3; BiPAP in place.  Mild difficulty speaking in full sentences. Respiratory system: Positive tachypnea; no chest pain, decrease breath sounds at the bases with fine crackles.  Positive expiratory wheezing. Cardiovascular system: Irregular, no rubs, no gallops, no JVD on exam. Gastrointestinal system: Abdomen is nondistended, soft and nontender. No organomegaly or masses felt. Normal bowel sounds heard. Central nervous system: No focal neurological deficits. Extremities: No cyanosis or clubbing; trace to 1+ edema appreciated bilaterally. Skin: No petechiae.  Right lower extremity with chronic stasis dermatitis and healing wounds.  No signs of acute infection  appreciated. Psychiatry: Judgement and insight appear normal. Mood & affect appropriate.   Data Reviewed: Basic metabolic panel: Sodium 132, potassium 3.5, chloride 87, CO2 32, glucose 115, BUN 14, creatinine 1.17, GFR 15 CBC: WBC 6.1, hemoglobin 10.4 and platelet count 1 91K BNP: 849 Troponin: 19>>20 MRSA PCR: Negative Arterial blood gas: pH 7.35, CO2 62, pO2 80, HCO3 34.2   Assessment and Plan: Benign essential HTN -Resume home antihypertensive regimen -Follow vital signs and adjust medication as needed. -Heart healthy diet discussed with patient.  Right tibial fracture -Healing appropriately -Actively following with orthopedic service -Continue as needed analgesics.  Acute on chronic respiratory failure with hypoxia (HCC) -In the setting of COPD and CHF exacerbation as mentioned above -Continue oxygen supplementation and wean off down to baseline as tolerated.  Hypothyroidism -Continue Synthroid -Update TSH.  CKD (chronic kidney disease) stage 3, GFR 30-59 ml/min (HCC) -For the most part appears to be stable and at baseline -Follow renal function trend while actively diuresing patient -Electrolytes stable. -Adequate oral hydration and heart healthy/low-sodium diet discussed with patient.  COPD with acute exacerbation (HCC) -No frank infiltrates appreciated; positive chronic bronchitis changes seen. -Treatment with nebulizer management, mucolytic's, steroids, oral doxycycline and oxygen supplementation will be provided. -Patient chronically on 2 L supplementation, at time of admission required to be placed on BiPAP to stabilize work of breathing.  Acute on chronic diastolic congestive heart failure (HCC) -Elevated BNP and vascular congestion appreciated at time of admission -Follow low-sodium diet, daily weights and strict I's and O's -IV diuresis has been started -Update 2D echo -Follow clinical response.  H/O heart valve replacement with mechanical  valve -Continue the use of Coumadin, dosed per pharmacy.  GERD -Continue PPI.  Atrial fibrillation, chronic (HCC) -Continue the use of Cardizem and and bisoprolol -Chronically on Coumadin -Continue telemetry monitoring -Follow electrolytes and replete for potassium above 4 and magnesium above 2.  Hyperlipidemia -Continue statin. -Heart healthy diet discussed with patient.     Advance Care Planning:   Code Status: Full Code   Consults: None  Family Communication: Son at bedside.  Severity of Illness: The appropriate patient status for this patient is INPATIENT. Inpatient status is judged to be reasonable and necessary in order to provide the required intensity of service to ensure the patient's safety. The patient's presenting symptoms, physical exam findings,  and initial radiographic and laboratory data in the context of their chronic comorbidities is felt to place them at high risk for further clinical deterioration. Furthermore, it is not anticipated that the patient will be medically stable for discharge from the hospital within 2 midnights of admission.   * I certify that at the point of admission it is my clinical judgment that the patient will require inpatient hospital care spanning beyond 2 midnights from the point of admission due to high intensity of service, high risk for further deterioration and high frequency of surveillance required.*  Author: Vassie Loll, MD 09/09/2023 12:11 PM  For on call review [email or web address].

## 2023-09-09 NOTE — ED Triage Notes (Signed)
Pt arriving by [facility] from home with shortness of breath for the past several days, but exacerbated today. Pt has history of COPD and A-Fib. Pt denies nausea and vomiting.

## 2023-09-09 NOTE — Progress Notes (Signed)
Patient taken to CT and back to ED 10 without any complications.

## 2023-09-09 NOTE — Progress Notes (Signed)
PHARMACY - ANTICOAGULATION CONSULT NOTE  Pharmacy Consult for Coumadin Indication:  aortic mechanical valve    Allergies  Allergen Reactions   Penicillin G Itching and Other (See Comments)   Penicillins Hives, Itching, Swelling and Rash    Patient Measurements: Height: 5\' 8"  (172.7 cm) Weight: 69.9 kg (154 lb) IBW/kg (Calculated) : 63.9   Vital Signs: Temp: 97.9 F (36.6 C) (11/20 0839) Temp Source: Axillary (11/20 0839) BP: 138/88 (11/20 0839) Pulse Rate: 106 (11/20 1128)  Labs: Recent Labs    09/09/23 0942 09/09/23 1210  HGB 10.4*  --   HCT 33.7*  --   PLT 191  --   LABPROT 18.1*  --   INR 1.5*  --   CREATININE 1.17*  --   TROPONINIHS 19* 20*    Estimated Creatinine Clearance: 44.5 mL/min (A) (by C-G formula based on SCr of 1.17 mg/dL (H)).   Medical History: Past Medical History:  Diagnosis Date   Anxiety disorder    Aortic stenosis    Status post St. Jude mechanical AVR 2007   Asthma    Atrial fibrillation (HCC)    Carcinoid tumor of colon 2007   Chronic diastolic heart failure (HCC)    Coronary atherosclerosis of native coronary artery    Status post CABG 2007   GERD (gastroesophageal reflux disease)    History of colonoscopy 2003   Dr. 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 Jena Gauss, benign biopsies   RLS (restless legs syndrome)    Schatzki's ring     Medications:  See med rec  Assessment: Patient presented with SOB. Pharmacy consulted to dose warfarin in patient with atrial fibrillation and mechanical AVR. Patient's warfarin home dose listed as 4 mg on Tuesday and 3 mg ROW.  INR = 1.5, subtherapeutic  Goal of Therapy:  INR 2-3 Monitor platelets by anticoagulation protocol: Yes   Plan:  Coumadin 4mg  po x 1 today Daily PT-INR Monitor for S/S of bleeding  Elder Cyphers, BS Pharm D, BCPS Clinical Pharmacist 09/09/2023,1:10 PM

## 2023-09-09 NOTE — Assessment & Plan Note (Signed)
Continue PPI ?

## 2023-09-09 NOTE — ED Notes (Signed)
Placed on Non Re breather 15L stats 78. Respiratory at bedside.

## 2023-09-09 NOTE — Progress Notes (Addendum)
Pharmacy Antibiotic Note  [name] is a 72 y.o. female admitted on 09/09/2023 with pneumonia.  Pharmacy has been consulted for Vancomycin dosing.  Plan: Vancomycin 1500mg  IV loading dose then 1000 mg IV Q 24 hrs. Goal AUC 400-550. Expected AUC: 460 SCr used: 1.17  F/U cxs and clinical progress Monitor V/S, labs and levels as indicated   Height: 5\' 8"  (172.7 cm) Weight: 69.9 kg (154 lb) IBW/kg (Calculated) : 63.9  Temp (24hrs), Avg:97.9 F (36.6 C), Min:97.9 F (36.6 C), Max:97.9 F (36.6 C)  Recent Labs  Lab 09/09/23 0942  WBC 6.1  CREATININE 1.17*    Estimated Creatinine Clearance: 44.5 mL/min (A) (by C-G formula based on SCr of 1.17 mg/dL (H)).    Allergies  Allergen Reactions   Penicillin G Itching and Other (See Comments)   Penicillins Hives, Itching, Swelling and Rash    Antimicrobials this admission: Vancomycin  11/20 >>  Levaquin 11/20 in ED   Microbiology results: 11/20 BCx: pending  MRSA PCR:   Thank you for allowing pharmacy to be a part of this patient's care.  Elder Cyphers, BS Pharm D, BCPS Clinical Pharmacist 09/09/2023 11:07 AM

## 2023-09-09 NOTE — Assessment & Plan Note (Signed)
Continue statin 

## 2023-09-09 NOTE — Assessment & Plan Note (Signed)
-  Continue the use of Coumadin, dosed per pharmacy.

## 2023-09-09 NOTE — Assessment & Plan Note (Signed)
-  Resume home antihypertensive regimen -Follow vital signs and adjust medication as needed. -Heart healthy diet discussed with patient.

## 2023-09-09 NOTE — Assessment & Plan Note (Signed)
-  Continue Synthroid. -Update TSH.

## 2023-09-09 NOTE — Assessment & Plan Note (Signed)
-  In the setting of COPD and CHF exacerbation as mentioned above -Continue oxygen supplementation and wean off down to baseline as tolerated.

## 2023-09-09 NOTE — Progress Notes (Signed)
Patient removed from Bipap at this time and placed on 4/5 lpm high flow [place]. She is comfortable at this time and does not have SOB right now. Her SPO2 is around 94%. RN made aware.

## 2023-09-09 NOTE — ED Provider Notes (Signed)
[facility] AT [facility] Provider Note   CSN: [id number] Arrival date & time: 09/09/23  4098     History  Chief Complaint  Patient presents with   Shortness of Breath    [name] is a 72 y.o. female.  72 yo F with with hx of CHF, AF on Coumadin, and COPD now on 4 L home oxygen who presents emergency department shortness of breath.  Patient reports that for the past 2 days has had shortness of breath.  Has also had a cough as well.  Denies any chest pain, fevers, runny nose or sore throat or sick contacts.  When EMS arrived had wheezing and was given Solu-Medrol, ipratropium, and albuterol.  Denies any leg swelling.  Has never required intubations for COPD.       Home Medications Prior to Admission medications   Medication Sig Start Date End Date Taking? Authorizing Provider  ALPRAZolam Prudy Feeler) 0.5 MG tablet Take 1 tablet (0.5 mg total) by mouth 2 (two) times daily. 06/24/23  Yes Tat, [name], MD  bisoprolol (ZEBETA) 5 MG tablet Take 5 mg by mouth daily.   Yes [provider]  CAPLYTA 42 MG capsule Take 42 mg by mouth daily. 02/17/22  Yes [provider]  diltiazem (CARDIZEM CD) 120 MG 24 hr capsule Take 1 capsule (120 mg total) by mouth daily. 04/01/18  Yes Vassie Loll, MD  FARXIGA 10 MG TABS tablet Take 10 mg by mouth daily. 01/21/21  Yes [provider]  ipratropium (ATROVENT) 0.06 % nasal spray Place 2 sprays into both nostrils in the morning and at bedtime.   Yes [provider]  levothyroxine (SYNTHROID, LEVOTHROID) 50 MCG tablet Take 50 mcg by mouth daily before breakfast.    Yes [provider]  Multiple Vitamin (MULTIVITAMIN WITH MINERALS) TABS tablet Take 1 tablet by mouth daily.   Yes [provider]  omeprazole (PRILOSEC) 20 MG capsule Take 20 mg by mouth daily.   Yes [provider]  oxyCODONE 10 MG TABS Take 1 tablet (10 mg total) by mouth every 4 (four) hours as needed for  severe pain. 06/24/23  Yes Tat, [name], MD  OXYGEN Inhale 4 L into the lungs daily.   Yes [provider]  oxymetazoline (AFRIN) 0.05 % nasal spray Place 1 spray into both nostrils 2 (two) times daily as needed for congestion.   Yes [provider]  rOPINIRole (REQUIP) 3 MG tablet Take 3 mg by mouth 2 (two) times daily.   Yes [provider]  rosuvastatin (CRESTOR) 20 MG tablet TAKE ONE (1) TABLET BY MOUTH EVERY DAY 02/06/23  Yes Jonelle Sidle, MD  torsemide (DEMADEX) 100 MG tablet Take 100 mg by mouth daily.   Yes [provider]  warfarin (COUMADIN) 2 MG tablet Take 1.5-2 tablets (3-4 mg total) by mouth See admin instructions. 4mg  on Tuesday and 3 mg daily the rest of the week. 09/06/18  Yes Vassie Loll, MD      Allergies    Penicillin g and Penicillins    Review of Systems   Review of Systems  Physical Exam Updated Vital Signs BP 110/71   Pulse 93   Temp 98 F (36.7 C) (Axillary)   Resp (!) 35   Ht 5\' 8"  (1.727 m)   Wt 68.5 kg   SpO2 97%   BMI 22.96 kg/m  Physical Exam Vitals and nursing note reviewed.  Constitutional:      General: She is  in acute distress.     Appearance: She is well-developed. She is ill-appearing.  HENT:     Head: Normocephalic and atraumatic.     Right Ear: External ear normal.     Left Ear: External ear normal.     Nose: Nose normal.  Eyes:     Extraocular Movements: Extraocular movements intact.     Conjunctiva/sclera: Conjunctivae normal.     Pupils: Pupils are equal, round, and reactive to light.  Cardiovascular:     Rate and Rhythm: Tachycardia present. Rhythm irregular.     Heart sounds: Murmur heard.  Pulmonary:     Effort: Respiratory distress present.     Breath sounds: Wheezing present.     Comments: Diminished breath sounds with wheezing.  On nonrebreather.  Speaking in short phrases.  Significant accessory muscle use. Musculoskeletal:     Cervical back: Normal range of motion and neck supple.      Right lower leg: No edema.     Left lower leg: No edema.  Skin:    General: Skin is warm and dry.  Neurological:     Mental Status: She is alert and oriented to person, place, and time. Mental status is at baseline.  Psychiatric:        Mood and Affect: Mood normal.     ED Results / Procedures / Treatments   Labs (all labs ordered are listed, but only abnormal results are displayed) Labs Reviewed  BASIC METABOLIC PANEL - Abnormal; Notable for the following components:      Result Value   Sodium 132 (*)    Chloride 87 (*)    Glucose, Bld 115 (*)    Creatinine, Ser 1.17 (*)    GFR, Estimated 50 (*)    All other components within normal limits  CBC WITH DIFFERENTIAL/PLATELET - Abnormal; Notable for the following components:   RBC 3.70 (*)    Hemoglobin 10.4 (*)    HCT 33.7 (*)    RDW 16.0 (*)    Lymphs Abs 0.5 (*)    All other components within normal limits  BRAIN NATRIURETIC PEPTIDE - Abnormal; Notable for the following components:   B Natriuretic Peptide 849.0 (*)    All other components within normal limits  BLOOD GAS, VENOUS - Abnormal; Notable for the following components:   pCO2, Ven 72 (*)    pO2, Ven <31 (*)    Bicarbonate 35.9 (*)    Acid-Base Excess 6.8 (*)    All other components within normal limits  PROTIME-INR - Abnormal; Notable for the following components:   Prothrombin Time 18.1 (*)    INR 1.5 (*)    All other components within normal limits  BLOOD GAS, ARTERIAL - Abnormal; Notable for the following components:   pCO2 arterial 62 (*)    pO2, Arterial 80 (*)    Bicarbonate 34.2 (*)    Acid-Base Excess 6.5 (*)    All other components within normal limits  MAGNESIUM - Abnormal; Notable for the following components:   Magnesium 2.9 (*)    All other components within normal limits  TROPONIN I (HIGH SENSITIVITY) - Abnormal; Notable for the following components:   Troponin I (High Sensitivity) 19 (*)    All other components within normal limits   TROPONIN I (HIGH SENSITIVITY) - Abnormal; Notable for the following components:   Troponin I (High Sensitivity) 20 (*)    All other components within normal limits  RESP PANEL BY RT-PCR (RSV, FLU A&B, COVID)  RVPGX2  MRSA NEXT GEN BY PCR, NASAL  CULTURE, BLOOD (ROUTINE X 2)  CULTURE, BLOOD (ROUTINE X 2)  PROCALCITONIN  PHOSPHORUS  TSH  BASIC METABOLIC PANEL  CBC  PROTIME-INR    EKG EKG Interpretation Date/Time:  Wednesday September 09 2023 08:25:56 EST Ventricular Rate:  109 PR Interval:    QRS Duration:  97 QT Interval:  324 QTC Calculation: 437 R Axis:   83  Text Interpretation: Atrial fibrillation Borderline right axis deviation Repol abnrm, severe global ischemia (LM/MVD) No significant change since 06/11/23 Confirmed by Vonita Moss [phone]) on 09/09/2023 11:25:24 AM  Radiology CT Chest W Contrast  Result Date: 09/09/2023 CLINICAL DATA:  Right pleural effusion. EXAM: CT CHEST WITH CONTRAST TECHNIQUE: Multidetector CT imaging of the chest was performed during intravenous contrast administration. RADIATION DOSE REDUCTION: This exam was performed according to the departmental dose-optimization program which includes automated exposure control, adjustment of the mA and/or kV according to patient size and/or use of iterative reconstruction technique. CONTRAST:  75mL OMNIPAQUE IOHEXOL 300 MG/ML  SOLN COMPARISON:  June 11, 2023. FINDINGS: Cardiovascular: Status post surgical repair of ascending thoracic aorta as well as aortic valve repair. No dissection is noted. Moderate cardiomegaly is noted. No pericardial effusion. Status post coronary artery bypass graft. Left atrial enlargement is noted. Enlarged pulmonary artery is noted suggesting pulmonary artery hypertension. Mediastinum/Nodes: No enlarged mediastinal, hilar, or axillary lymph nodes. Thyroid gland, trachea, and esophagus demonstrate no significant findings. Lungs/Pleura: Mild bilateral pleural effusions are noted with  atelectasis of the adjacent lower lobes. No pneumothorax is noted. Mild reticular densities are noted in the lung bases suggesting possible pulmonary edema. Upper Abdomen: No acute abnormality. Musculoskeletal: Old left rib fractures are noted. No acute osseous abnormality is noted. IMPRESSION: Mild bilateral pleural effusions are noted with atelectasis of the adjacent lower lobes. Possible mild bilateral pulmonary edema. Status post surgical repair of ascending thoracic aorta as well as aortic valve repair. Status post coronary artery bypass graft. Moderate cardiomegaly is noted with left atrial enlargement. Enlarged pulmonary arteries suggesting pulmonary artery hypertension. Aortic Atherosclerosis (ICD10-I70.0). Electronically Signed   By: Lupita Raider M.D.   On: 09/09/2023 15:21   DG Chest Port 1 View  Result Date: 09/09/2023 CLINICAL DATA:  Shortness of breath. EXAM: PORTABLE CHEST 1 VIEW COMPARISON:  06/20/2023. FINDINGS: Low lung volume. Mild-to-moderate pulmonary vascular congestion. There is moderate-to-large right and small-to-moderate left pleural effusion with associated underlying compressive atelectatic changes. No pneumothorax on either side. Evaluation of cardiomediastinal silhouette is nondiagnostic due to bilateral lower hemithoracic opacification. No acute osseous abnormalities. The soft tissues are within normal limits. IMPRESSION: *Findings favor congestive heart failure/pulmonary edema. There are bilateral pleural effusions, right more than left. Electronically Signed   By: Jules Schick M.D.   On: 09/09/2023 10:50    Procedures Procedures    Medications Ordered in ED Medications  levalbuterol (XOPENEX) 0.63 MG/3ML nebulizer solution (  Not Given 09/09/23 0854)  levalbuterol (XOPENEX) nebulizer solution 0.63 mg (has no administration in time range)  ipratropium (ATROVENT) nebulizer solution 0.5 mg (0.5 mg Nebulization Given 09/09/23 1623)  budesonide (PULMICORT) nebulizer  solution 0.5 mg (0.5 mg Nebulization Not Given 09/09/23 1157)  doxycycline (VIBRA-TABS) tablet 100 mg (100 mg Oral Not Given 09/09/23 1459)  dextromethorphan-guaiFENesin (MUCINEX DM) 30-600 MG per 12 hr tablet 1 tablet (1 tablet Oral Not Given 09/09/23 1600)  furosemide (LASIX) injection 40 mg (has no administration in time range)  sodium chloride flush (NS) 0.9 % injection 3 mL (has no administration in  time range)  sodium chloride flush (NS) 0.9 % injection 3 mL (has no administration in time range)  0.9 %  sodium chloride infusion (has no administration in time range)  acetaminophen (TYLENOL) tablet 650 mg (has no administration in time range)    Or  acetaminophen (TYLENOL) suppository 650 mg (has no administration in time range)  ondansetron (ZOFRAN) tablet 4 mg (has no administration in time range)    Or  ondansetron (ZOFRAN) injection 4 mg (has no administration in time range)  ALPRAZolam (XANAX) tablet 0.25 mg (0.25 mg Oral Given 09/09/23 1707)  bisoprolol (ZEBETA) tablet 5 mg (5 mg Oral Given 09/09/23 1700)  diltiazem (CARDIZEM CD) 24 hr capsule 120 mg (120 mg Oral Given 09/09/23 1701)  levothyroxine (SYNTHROID) tablet 50 mcg (has no administration in time range)  pantoprazole (PROTONIX) EC tablet 40 mg (40 mg Oral Given 09/09/23 1701)  rOPINIRole (REQUIP) tablet 3 mg (3 mg Oral Not Given 09/09/23 1500)  rosuvastatin (CRESTOR) tablet 10 mg (10 mg Oral Given 09/09/23 1700)  oxyCODONE (Oxy IR/ROXICODONE) immediate release tablet 10 mg (has no administration in time range)  Warfarin - Pharmacist Dosing Inpatient ( Does not apply Given 09/09/23 1705)  levalbuterol (XOPENEX) nebulizer solution 0.63 mg (0.63 mg Nebulization Given 09/09/23 0854)  magnesium sulfate IVPB 2 g 50 mL (0 g Intravenous Stopped 09/09/23 1045)  albuterol (PROVENTIL) (2.5 MG/3ML) 0.083% nebulizer solution (10 mg/hr Nebulization Given 09/09/23 0950)  midazolam (VERSED) injection 2 mg (2 mg Intravenous Given 09/09/23  0934)  iohexol (OMNIPAQUE) 300 MG/ML solution 75 mL (75 mLs Intravenous Contrast Given 09/09/23 1108)  furosemide (LASIX) 100 mg in dextrose 5 % 50 mL IVPB (0 mg Intravenous Stopped 09/09/23 1306)  warfarin (COUMADIN) tablet 4 mg (4 mg Oral Given 09/09/23 1700)    ED Course/ Medical Decision Making/ A&P Clinical Course as of 09/09/23 1952  Wed Sep 09, 2023  1047 B Natriuretic Peptide(!): 849.0 Baseline of 650 [RP]  1123 Full code verified with the patient [RP]  1149 Dr Gwenlyn Perking from hospitalist to admit the patient [RP]    Clinical Course User Index [RP] Rondel Baton, MD                                 Medical Decision Making Amount and/or Complexity of Data Reviewed Labs: ordered. Decision-making details documented in ED Course. Radiology: ordered.  Risk Prescription drug management. Decision regarding hospitalization.   NEISHA REAMES is a 72 y.o. female with comorbidities that complicate the patient evaluation including with a history of CHF, atrial fibrillation on Coumadin, and COPD on 4 L home oxygen who presents to the emergency department with shortness of breath  Initial Ddx:  Hypoxic respiratory failure, hypercapnic respiratory failure, COPD exacerbation, heart failure exacerbation, pneumonia, URI  MDM/Course:  Patient presents to the emergency department with 2 days of shortness of breath.  Also has had a cough.  On exam has accessory muscle use and appears very tachypneic and is hypoxic.  Was placed on BiPAP due to her work of breathing.  Did have diffuse wheezing and diminished breath sounds.  Initially was concerned about possible COPD exacerbation versus CHF exacerbation.  Was given albuterol, Solu-Medrol, and ipratropium by EMS.  Was started on additional continuous albuterol here in the emergency department.  Chest x-ray showed pulmonary edema versus an infiltrate on the right side that is concerning for possible loculated effusion or empyema upon my read.  She  was started on upon antibiotics and also diuresed.  CT scan obtained to further evaluate.  Re-evaluation work of breathing had improved on the BiPAP and she was admitted to hospitalist for further management.  At this time feel that after my review of the CT scan that she likely is volume overloaded from a heart failure exacerbation rather than infectious cause.  This patient presents to the ED for concern of complaints listed in HPI, this involves an extensive number of treatment options, and is a complaint that carries with it a high risk of complications and morbidity. Disposition including potential need for admission considered.   Dispo: Admit to Step Down  Additional history obtained from son Records reviewed Outpatient Clinic Notes The following labs were independently interpreted: Chemistry and show no acute abnormality I independently reviewed the following imaging with scope of interpretation limited to determining acute life threatening conditions related to emergency care: Chest x-ray and agree with the radiologist interpretation with the following exceptions: Possible loculated effusion I personally reviewed and interpreted cardiac monitoring: Atrial fibrillation I personally reviewed and interpreted the pt's EKG: see above for interpretation  I have reviewed the patients home medications and made adjustments as needed Consults: Hospitalist Social Determinants of health:  Elderly  Portions of this note were generated with Scientist, clinical (histocompatibility and immunogenetics). Dictation errors may occur despite best attempts at proofreading.    CRITICAL CARE Performed by: Rondel Baton   Total critical care time: 45 minutes  Critical care time was exclusive of separately billable procedures and treating other patients.  Critical care was necessary to treat or prevent imminent or life-threatening deterioration.  Critical care was time spent personally by me on the following activities: development  of treatment plan with patient and/or surrogate as well as nursing, discussions with consultants, evaluation of patient's response to treatment, examination of patient, obtaining history from patient or surrogate, ordering and performing treatments and interventions, ordering and review of laboratory studies, ordering and review of radiographic studies, pulse oximetry and re-evaluation of patient's condition.   Final Clinical Impression(s) / ED Diagnoses Final diagnoses:  Hypoxia  Shortness of breath  Acute on chronic congestive heart failure, unspecified heart failure type (HCC)  COPD exacerbation (HCC)    Rx / DC Orders ED Discharge Orders     None         Rondel Baton, MD 09/09/23 1952

## 2023-09-09 NOTE — Assessment & Plan Note (Signed)
-  No frank infiltrates appreciated; positive chronic bronchitis changes seen. -Treatment with nebulizer management, mucolytic's, steroids, oral doxycycline and oxygen supplementation will be provided. -Patient chronically on 2 L supplementation, at time of admission required to be placed on BiPAP to stabilize work of breathing.

## 2023-09-09 NOTE — Assessment & Plan Note (Signed)
-  For the most part appears to be stable and at baseline -Follow renal function trend while actively diuresing patient -Electrolytes stable. -Adequate oral hydration and heart healthy/low-sodium diet discussed with patient.

## 2023-09-09 NOTE — Assessment & Plan Note (Signed)
-  Continue the use of Cardizem and and bisoprolol -Chronically on Coumadin -Continue telemetry monitoring -Follow electrolytes and replete for potassium above 4 and magnesium above 2.

## 2023-09-09 NOTE — Assessment & Plan Note (Signed)
-  Healing appropriately -Actively following with orthopedic service -Continue as needed analgesics.

## 2023-09-09 NOTE — Assessment & Plan Note (Signed)
-  Elevated BNP and vascular congestion appreciated at time of admission -Follow low-sodium diet, daily weights and strict I's and O's -IV diuresis has been started -Update 2D echo -Follow clinical response.

## 2023-09-10 ENCOUNTER — Other Ambulatory Visit (HOSPITAL_COMMUNITY): Payer: Self-pay | Admitting: *Deleted

## 2023-09-10 ENCOUNTER — Inpatient Hospital Stay (HOSPITAL_COMMUNITY): Payer: Medicare Other

## 2023-09-10 DIAGNOSIS — E785 Hyperlipidemia, unspecified: Secondary | ICD-10-CM | POA: Diagnosis not present

## 2023-09-10 DIAGNOSIS — I5031 Acute diastolic (congestive) heart failure: Secondary | ICD-10-CM

## 2023-09-10 DIAGNOSIS — I482 Chronic atrial fibrillation, unspecified: Secondary | ICD-10-CM | POA: Diagnosis not present

## 2023-09-10 DIAGNOSIS — N1831 Chronic kidney disease, stage 3a: Secondary | ICD-10-CM

## 2023-09-10 DIAGNOSIS — I5033 Acute on chronic diastolic (congestive) heart failure: Secondary | ICD-10-CM | POA: Diagnosis not present

## 2023-09-10 DIAGNOSIS — J9621 Acute and chronic respiratory failure with hypoxia: Secondary | ICD-10-CM | POA: Diagnosis not present

## 2023-09-10 LAB — ECHOCARDIOGRAM COMPLETE
AR max vel: 1.22 cm2
AV Area VTI: 1.39 cm2
AV Area mean vel: 1.06 cm2
AV Mean grad: 5 mmHg
AV Peak grad: 10.9 mmHg
Ao pk vel: 1.65 m/s
Area-P 1/2: 4.8 cm2
Height: 68 in
MV VTI: 0.89 cm2
S' Lateral: 2.9 cm
Weight: 2409.19 [oz_av]

## 2023-09-10 LAB — CBC
HCT: 30.7 % — ABNORMAL LOW (ref 36.0–46.0)
Hemoglobin: 9.1 g/dL — ABNORMAL LOW (ref 12.0–15.0)
MCH: 27.2 pg (ref 26.0–34.0)
MCHC: 29.6 g/dL — ABNORMAL LOW (ref 30.0–36.0)
MCV: 91.6 fL (ref 80.0–100.0)
Platelets: 203 10*3/uL (ref 150–400)
RBC: 3.35 MIL/uL — ABNORMAL LOW (ref 3.87–5.11)
RDW: 16.2 % — ABNORMAL HIGH (ref 11.5–15.5)
WBC: 7.3 10*3/uL (ref 4.0–10.5)
nRBC: 0 % (ref 0.0–0.2)

## 2023-09-10 LAB — BASIC METABOLIC PANEL WITH GFR
Anion gap: 9 (ref 5–15)
BUN: 16 mg/dL (ref 8–23)
CO2: 36 mmol/L — ABNORMAL HIGH (ref 22–32)
Calcium: 8.6 mg/dL — ABNORMAL LOW (ref 8.9–10.3)
Chloride: 88 mmol/L — ABNORMAL LOW (ref 98–111)
Creatinine, Ser: 1.19 mg/dL — ABNORMAL HIGH (ref 0.44–1.00)
GFR, Estimated: 49 mL/min — ABNORMAL LOW
Glucose, Bld: 135 mg/dL — ABNORMAL HIGH (ref 70–99)
Potassium: 3.8 mmol/L (ref 3.5–5.1)
Sodium: 133 mmol/L — ABNORMAL LOW (ref 135–145)

## 2023-09-10 LAB — PROTIME-INR
INR: 1.6 — ABNORMAL HIGH (ref 0.8–1.2)
Prothrombin Time: 18.9 s — ABNORMAL HIGH (ref 11.4–15.2)

## 2023-09-10 LAB — GLUCOSE, CAPILLARY
Glucose-Capillary: 115 mg/dL — ABNORMAL HIGH (ref 70–99)
Glucose-Capillary: 127 mg/dL — ABNORMAL HIGH (ref 70–99)

## 2023-09-10 MED ORDER — DERMACERIN EX CREA
TOPICAL_CREAM | Freq: Two times a day (BID) | CUTANEOUS | Status: DC
  Filled 2023-09-10 (×2): qty 107
  Filled 2023-09-10: qty 113

## 2023-09-10 MED ORDER — PERFLUTREN LIPID MICROSPHERE
1.0000 mL | INTRAVENOUS | Status: AC | PRN
  Administered 2023-09-10: 3 mL via INTRAVENOUS

## 2023-09-10 MED ORDER — ORAL CARE MOUTH RINSE
15.0000 mL | OROMUCOSAL | Status: DC
  Administered 2023-09-10 – 2023-09-23 (×44): 15 mL via OROMUCOSAL

## 2023-09-10 MED ORDER — ORAL CARE MOUTH RINSE: 15.0000 mL | OROMUCOSAL | Status: DC | PRN

## 2023-09-10 MED ORDER — WARFARIN SODIUM 5 MG PO TABS
5.0000 mg | ORAL_TABLET | Freq: Once | ORAL | Status: AC
  Administered 2023-09-10: 5 mg via ORAL
  Filled 2023-09-10: qty 1

## 2023-09-10 NOTE — Progress Notes (Signed)
PHARMACY - ANTICOAGULATION CONSULT NOTE  Pharmacy Consult for Coumadin Indication:  aortic mechanical valve    Allergies  Allergen Reactions   Penicillin G Itching and Other (See Comments)   Penicillins Hives, Itching, Swelling and Rash    Patient Measurements: Height: 5\' 8"  (172.7 cm) Weight: 68.3 kg (150 lb 9.2 oz) IBW/kg (Calculated) : 63.9   Vital Signs: Temp: 97.6 F (36.4 C) (11/21 1123) Temp Source: Oral (11/21 1123) BP: 100/59 (11/21 1000) Pulse Rate: 78 (11/21 1151)  Labs: Recent Labs    09/09/23 0942 09/09/23 1210 09/10/23 0442  HGB 10.4*  --  9.1*  HCT 33.7*  --  30.7*  PLT 191  --  203  LABPROT 18.1*  --  18.9*  INR 1.5*  --  1.6*  CREATININE 1.17*  --  1.19*  TROPONINIHS 19* 20*  --     Estimated Creatinine Clearance: 43.7 mL/min (A) (by C-G formula based on SCr of 1.19 mg/dL (H)).   Medical History: Past Medical History:  Diagnosis Date   Anxiety disorder    Aortic stenosis    Status post [employer] mechanical AVR 2007   Asthma    Atrial fibrillation (HCC)    Carcinoid tumor of colon 2007   Chronic diastolic heart failure (HCC)    Coronary atherosclerosis of native coronary artery    Status post CABG 2007   GERD (gastroesophageal reflux disease)    History of colonoscopy 2003   Dr. 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 Jena Gauss, benign biopsies   RLS (restless legs syndrome)    Schatzki's ring    Assessment: Patient presented with SOB. Pharmacy consulted to dose warfarin in patient with atrial fibrillation and mechanical AVR. Patient's warfarin home dose listed as 4 mg on Tuesday and 3 mg ROW.   INR still low at 1.6 this morning, although now trending up. Will give slightly higher dose again today.   Goal of Therapy:  INR 2-3 Monitor platelets by anticoagulation protocol: Yes   Plan:  Coumadin 5mg  po x 1 today Daily PT-INR Monitor for S/S of  bleeding  Sheppard Coil PharmD., BCPS Clinical Pharmacist 09/10/2023 12:09 PM

## 2023-09-10 NOTE — TOC Initial Note (Signed)
Transition of Care [facility]) - Initial/Assessment Note    Patient Details  Name: [name] MRN: [id number] Date of Birth: 11/17/1950  Transition of Care [facility]) CM/SW Contact:    Villa Herb, LCSWA Phone Number: 09/10/2023, 2:03 PM  Clinical Narrative:                 Pt is high risk for readmission. Pt lives with her son. Pt has assistance by family in the home. CSW updated by Maralyn Sago with [facility] that pt is active for [facility] PT/RN with them. CSW spoke with pts son about PT recommendation for SNF if family could not provide assistance. Pts son states that he is out of work and staying home to assist pt. Pts son states they prefer for pt to return home with continued [facility] services, HH will need resumption orders. Pt has needed DME in the home. TOC to follow.   Expected Discharge Plan: Home w Home Health Services Barriers to Discharge: Continued Medical Work up   Patient Goals and CMS Choice Patient states their goals for this hospitalization and ongoing recovery are:: return home CMS Medicare.gov Compare Post Acute Care list provided to:: Patient Represenative (must comment) Choice offered to / list presented to : Patient, Adult Children      Expected Discharge Plan and Services In-house Referral: Clinical Social Work Discharge Planning Services: CM Consult Post Acute Care Choice: Home Health Living arrangements for the past 2 months: Single Family Home                           HH Arranged: RN, PT HH Agency: [facility] Contacted: 09/10/23   Representative spoke with at [facility] Agency: Sarah  Prior Living Arrangements/Services Living arrangements for the past 2 months: Single Family Home Lives with:: Adult Children Patient language and need for interpreter reviewed:: Yes Do you feel safe going back to the place where you live?: Yes      Need for Family Participation in Patient Care: Yes (Comment) Care giver support system in place?: Yes  (comment) Current home services: DME Criminal Activity/Legal Involvement Pertinent to Current Situation/Hospitalization: No - Comment as needed  Activities of Daily Living   ADL Screening (condition at time of admission) Independently performs ADLs?: No Does the patient have a NEW difficulty with bathing/dressing/toileting/self-feeding that is expected to last >3 days?: Yes (Initiates electronic notice to provider for possible OT consult) Does the patient have a NEW difficulty with getting in/out of bed, walking, or climbing stairs that is expected to last >3 days?: Yes (Initiates electronic notice to provider for possible PT consult) Does the patient have a NEW difficulty with communication that is expected to last >3 days?: No Is the patient deaf or have difficulty hearing?: No Does the patient have difficulty seeing, even when wearing glasses/contacts?: No Does the patient have difficulty concentrating, remembering, or making decisions?: No  Permission Sought/Granted                  Emotional Assessment Appearance:: Appears stated age       Alcohol / Substance Use: Not Applicable Psych Involvement: No (comment)  Admission diagnosis:  Acute on chronic respiratory failure with hypoxia (HCC) [J96.21] Patient Active Problem List   Diagnosis Date Noted   Acute on chronic respiratory failure with hypoxia (HCC) 09/09/2023   Rib fractures 06/11/2023   Right tibial fracture 06/11/2023   Acute diastolic CHF (congestive heart failure) (HCC)  05/29/2019   Itching 05/29/2019   Acute hypokalemia 05/05/2019   Acute CHF (congestive heart failure) (HCC) 04/08/2019   Acute CHF (HCC) 04/07/2019   Asthma, chronic, unspecified asthma severity, with acute exacerbation 04/07/2019   Benign essential HTN    CHF (congestive heart failure) (HCC) 07/26/2018   Transaminitis 07/23/2018   Acute on chronic respiratory failure with hypoxia and hypercapnia (HCC) 06/19/2018   Acute respiratory failure  with hypoxia (HCC)    Supratherapeutic INR 05/03/2018   CHF exacerbation (HCC) 05/02/2018   Acute metabolic encephalopathy 05/02/2018   CKD (chronic kidney disease) stage 3, GFR 30-59 ml/min (HCC) 05/02/2018   Hypothyroidism 05/02/2018   Generalized weakness 05/02/2018   Physical deconditioning    Coronary artery disease of bypass graft of native heart with stable angina pectoris (HCC)    Renal failure (ARF), acute on chronic (HCC)    Acute on chronic respiratory failure with hypercapnia (HCC) 03/24/2018   Hypomagnesemia 03/24/2018   Hypokalemia 03/24/2018   Hypophosphatemia 03/24/2018   Atrial fibrillation with RVR (HCC) 03/24/2018   Elevated troponin 03/24/2018   Coughing 01/14/2018   Anemia 01/14/2018   Hx of adenomatous colonic polyps 01/14/2018   Acute on chronic diastolic congestive heart failure (HCC) 12/17/2016   COPD with acute exacerbation (HCC) 12/17/2016   Bilateral leg edema 07/05/2014   Encounter for therapeutic drug monitoring 11/15/2013   Chronic venous insufficiency 08/26/2011   Chronic anticoagulation 01/11/2011   Peptic stricture of esophagus 10/04/2010   GERD 09/16/2010   DYSPHAGIA 09/16/2010   H/O heart valve replacement with mechanical valve 06/05/2009   Hyperlipidemia 06/04/2009   Coronary atherosclerosis of native coronary artery 06/04/2009   Atrial fibrillation, chronic (HCC) 06/04/2009   PCP:  Rebekah Chesterfield, NP Pharmacy:   [facility] - [place], [place] - [address] [place] [address] Phone: [phone] Fax: [phone]     Social Determinants of Health (SDOH) Social History: SDOH Screenings   Food Insecurity: No Food Insecurity (09/09/2023)  Housing: Low Risk  (09/09/2023)  Transportation Needs: No Transportation Needs (09/09/2023)  Utilities: Not At Risk (09/09/2023)  Financial Resource Strain: Low Risk  (07/28/2018)  Physical Activity: Inactive (07/28/2018)  Social Connections: Somewhat Isolated  (07/28/2018)  Stress: No Stress Concern Present (07/28/2018)  Tobacco Use: Medium Risk (09/09/2023)  Health Literacy: Medium Risk (07/01/2023)   Received from [facility]   SDOH Interventions:     Readmission Risk Interventions    09/10/2023    2:02 PM  Readmission Risk Prevention Plan  Transportation Screening Complete  HRI or Home Care Consult Complete  Social Work Consult for Recovery Care Planning/Counseling Complete  Palliative Care Screening Not Applicable  Medication Review Oceanographer) Complete

## 2023-09-10 NOTE — Progress Notes (Signed)
Upon arrival to perform BIPAP check patient is off unit and back on nasal cannula. Per RN patient requested a break but is willing to be placed back on later. Will continue to monitor.

## 2023-09-10 NOTE — Plan of Care (Signed)
  Problem: Acute Rehab OT Goals (only OT should resolve) Goal: Pt. Will Perform Grooming Flowsheets (Taken 09/10/2023 0921) Pt Will Perform Grooming:  with modified independence  standing Goal: Pt. Will Perform Lower Body Dressing Flowsheets (Taken 09/10/2023 0921) Pt Will Perform Lower Body Dressing:  with modified independence  sitting/lateral leans Goal: Pt. Will Transfer To Toilet Flowsheets (Taken 09/10/2023 [phone]) Pt Will Transfer to Toilet:  with modified independence  ambulating Goal: Pt/Caregiver Will Perform Home Exercise Program Flowsheets (Taken 09/10/2023 [phone]) Pt/caregiver will Perform Home Exercise Program:  Increased strength  Both right and left upper extremity  Independently  Kirra Verga OT, MOT

## 2023-09-10 NOTE — Evaluation (Signed)
Physical Therapy Evaluation Patient Details Name: [name] MRN: [id number] DOB: [birth year] Today's Date: 09/10/2023  History of Present Illness  [name] is a 72 y.o. female with medical history significant of chronic diastolic heart failure, atrial fibrillation, history of aortic valve replacement (mechanical valve) chronically on Coumadin, history of COPD, chronic respiratory failure (on 2 L at baseline), gastroesophageal reflux disease, hypothyroidism, Paget's disease and restless leg syndrome; who presented to the hospital secondary to worsening shortness of breath.  Symptoms has been present for the last 3-4 days and worsening.  Family noticed decrease in care station capacity due to shortness of breath and also increased requirement for oxygen supplementation.  At home bronchodilator management has failed to improve symptoms.   Clinical Impression  Patient requires increased time with labored movement for sitting up at bedside, transferring to chair, limited to a few steps forward/backward at bedside due to c/o fatigue, SOB while on 4 LPM O2 and tolerated sitting up in chair after therapy.  Patient will benefit from continued skilled physical therapy in hospital and recommended venue below to increase strength, balance, endurance for safe ADLs and gait.         If plan is discharge home, recommend the following: A little help with walking and/or transfers;A little help with bathing/dressing/bathroom;Help with stairs or ramp for entrance;Assistance with cooking/housework   Can travel by private vehicle   Yes    Equipment Recommendations None recommended by PT  Recommendations for Other Services       Functional Status Assessment Patient has had a recent decline in their functional status and demonstrates the ability to make significant improvements in function in a reasonable and predictable amount of time.     Precautions / Restrictions Precautions Precautions:  Fall Restrictions Weight Bearing Restrictions: No      Mobility  Bed Mobility Overal bed mobility: Needs Assistance Bed Mobility: Supine to Sit     Supine to sit: Min assist, Mod assist     General bed mobility comments: increased time, labored movement    Transfers Overall transfer level: Needs assistance Equipment used: Rolling walker (2 wheels) Transfers: Sit to/from Stand, Bed to chair/wheelchair/BSC Sit to Stand: Min assist, Contact guard assist   Step pivot transfers: Contact guard assist, Min assist       General transfer comment: increased time with labored movement completing sit to stands from bedside/chair    Ambulation/Gait Ambulation/Gait assistance: Min assist Gait Distance (Feet): 15 Feet Assistive device: Rolling walker (2 wheels) Gait Pattern/deviations: Decreased step length - right, Decreased step length - left, Decreased stride length Gait velocity: decreased     General Gait Details: slow labored movement without loss of balance, limited mostly due to c/o fatigue, SOB while on 4 LPM O2  Stairs            Wheelchair Mobility     Tilt Bed    Modified Rankin (Stroke Patients Only)       Balance Overall balance assessment: Needs assistance Sitting-balance support: Feet supported, No upper extremity supported Sitting balance-Leahy Scale: Fair Sitting balance - Comments: fair/good seated at EOB   Standing balance support: Reliant on assistive device for balance, During functional activity, Bilateral upper extremity supported Standing balance-Leahy Scale: Fair Standing balance comment: using RW                             Pertinent Vitals/Pain Pain Assessment Pain Assessment: No/denies pain  Home Living Family/patient expects to be discharged to:: Private residence Living Arrangements: Children Available Help at Discharge: Family Type of Home: Mobile home Home Access: Stairs to enter Entrance Stairs-Rails: Can  reach both Entrance Stairs-Number of Steps: 2   Home Layout: One level Home Equipment: Agricultural consultant (2 wheels);BSC/3in1;Wheelchair - manual Additional Comments: Pt is alone around 8 hours a day    Prior Function Prior Level of Function : Needs assist       Physical Assist : ADLs (physical);Mobility (physical) Mobility (physical): Bed mobility;Transfers;Gait;Stairs ADLs (physical): Bathing;Dressing;IADLs Mobility Comments: Engineer, technical sales using RW ADLs Comments: Needs a little assist for bathing and dressing.     Extremity/Trunk Assessment   Upper Extremity Assessment Upper Extremity Assessment: Defer to OT evaluation    Lower Extremity Assessment Lower Extremity Assessment: Generalized weakness    Cervical / Trunk Assessment Cervical / Trunk Assessment: Normal  Communication   Communication Communication: Hearing impairment Cueing Techniques: Verbal cues;Tactile cues  Cognition Arousal: Alert Behavior During Therapy: WFL for tasks assessed/performed Overall Cognitive Status: Within Functional Limits for tasks assessed                                          General Comments      Exercises     Assessment/Plan    PT Assessment Patient needs continued PT services  PT Problem List Decreased strength;Decreased activity tolerance;Decreased balance;Decreased mobility       PT Treatment Interventions DME instruction;Gait training;Stair training;Functional mobility training;Therapeutic activities;Therapeutic exercise;Balance training;Patient/family education    PT Goals (Current goals can be found in the Care Plan section)  Acute Rehab PT Goals Patient Stated Goal: return home with family to assist PT Goal Formulation: With patient Time For Goal Achievement: 09/24/23 Potential to Achieve Goals: Good    Frequency Min 3X/week     Co-evaluation PT/OT/SLP Co-Evaluation/Treatment: Yes Reason for Co-Treatment: To address functional/ADL  transfers PT goals addressed during session: Mobility/safety with mobility;Balance;Proper use of DME OT goals addressed during session: ADL's and self-care       AM-PAC PT "6 Clicks" Mobility  Outcome Measure Help needed turning from your back to your side while in a flat bed without using bedrails?: A Little Help needed moving from lying on your back to sitting on the side of a flat bed without using bedrails?: A Little Help needed moving to and from a bed to a chair (including a wheelchair)?: A Little Help needed standing up from a chair using your arms (e.g., wheelchair or bedside chair)?: A Little Help needed to walk in hospital room?: A Little Help needed climbing 3-5 steps with a railing? : A Lot 6 Click Score: 17    End of Session Equipment Utilized During Treatment: Oxygen Activity Tolerance: Patient tolerated treatment well;Patient limited by fatigue;Patient limited by lethargy Patient left: in chair;with call bell/phone within reach Nurse Communication: Mobility status PT Visit Diagnosis: Unsteadiness on feet (R26.81);Other abnormalities of gait and mobility (R26.89);Muscle weakness (generalized) (M62.81)    Time: 6440-3474 PT Time Calculation (min) (ACUTE ONLY): 27 min   Charges:   PT Evaluation $PT Eval Moderate Complexity: 1 Mod PT Treatments $Therapeutic Activity: 23-37 mins PT General Charges $$ ACUTE PT VISIT: 1 Visit         12:03 PM, 09/10/23 Ocie Bob, MPT Physical Therapist with [facility] [phone] office [phone] mobile phone

## 2023-09-10 NOTE — Evaluation (Signed)
Occupational Therapy Evaluation Patient Details Name: [name] MRN: [id number] DOB: 1951/09/29 Today's Date: 09/10/2023   History of Present Illness [name] is a 72 y.o. female with medical history significant of chronic diastolic heart failure, atrial fibrillation, history of aortic valve replacement (mechanical valve) chronically on Coumadin, history of COPD, chronic respiratory failure (on 2 L at baseline), gastroesophageal reflux disease, hypothyroidism, Paget's disease and restless leg syndrome; who presented to the hospital secondary to worsening shortness of breath.  Symptoms has been present for the last 3-4 days and worsening.  Family noticed decrease in care station capacity due to shortness of breath and also increased requirement for oxygen supplementation.  At home bronchodilator management has failed to improve symptoms.   Clinical Impression   Pt agreeable to OT and PT co-evaluation. Pt requires some assist for ADL's at baseline. Pt reports that she is alone around 8 hours a day due to her son working. Pt required min A to supervision for mobility using the RW today. Pt was able to dress LE with set up assist seated in the chair. Pt was left in the chair with call bell within reach. Pt will benefit from continued OT in the hospital and recommended venue below to increase strength, balance, and endurance for safe ADL's.          If plan is discharge home, recommend the following: A little help with walking and/or transfers;A little help with bathing/dressing/bathroom;Assistance with cooking/housework;Assist for transportation;Help with stairs or ramp for entrance    Functional Status Assessment  Patient has had a recent decline in their functional status and demonstrates the ability to make significant improvements in function in a reasonable and predictable amount of time.  Equipment Recommendations  None recommended by OT    Recommendations for Other Services        Precautions / Restrictions Precautions Precautions: Fall Restrictions Weight Bearing Restrictions: No      Mobility Bed Mobility Overal bed mobility: Needs Assistance Bed Mobility: Supine to Sit     Supine to sit: Min assist, Mod assist     General bed mobility comments: labored effort; assist to pull to sit    Transfers Overall transfer level: Needs assistance Equipment used: Rolling walker (2 wheels) Transfers: Sit to/from Stand, Bed to chair/wheelchair/BSC Sit to Stand: Min assist, Contact guard assist     Step pivot transfers: Contact guard assist, Min assist     General transfer comment: Min A for initial boost from bed. More CGA for steps to chair using RW      Balance Overall balance assessment: Needs assistance Sitting-balance support: Feet supported, No upper extremity supported Sitting balance-Leahy Scale: Fair Sitting balance - Comments: fair to good seated at EOB   Standing balance support: Bilateral upper extremity supported, During functional activity, Reliant on assistive device for balance Standing balance-Leahy Scale: Fair Standing balance comment: using RW                           ADL either performed or assessed with clinical judgement   ADL Overall ADL's : Needs assistance/impaired     Grooming: Contact guard assist;Supervision/safety;Standing       Lower Body Bathing: Supervison/ safety;Set up;Sitting/lateral leans       Lower Body Dressing: Supervision/safety;Set up;Sitting/lateral leans Lower Body Dressing Details (indicate cue type and reason): Able to doff and don sock seated in recliner Toilet Transfer: Contact guard assist;Minimal assistance;Stand-pivot;Ambulation;Rolling walker (2 wheels) Toilet Transfer Details (  indicate cue type and reason): Simualted via EOB to chair transfer Toileting- Clothing Manipulation and Hygiene: Supervision/safety;Set up;Sitting/lateral lean       Functional mobility during  ADLs: Contact guard assist;Supervision/safety;Rolling walker (2 wheels) General ADL Comments: Able to ambulate short distance in the room with RW.     Vision Baseline Vision/History: 1 Wears glasses Ability to See in Adequate Light: 1 Impaired Patient Visual Report: No change from baseline Vision Assessment?: No apparent visual deficits     Perception Perception: Not tested       Praxis Praxis: Not tested       Pertinent Vitals/Pain Pain Assessment Pain Assessment: No/denies pain     Extremity/Trunk Assessment Upper Extremity Assessment Upper Extremity Assessment: Generalized weakness   Lower Extremity Assessment Lower Extremity Assessment: Defer to PT evaluation   Cervical / Trunk Assessment Cervical / Trunk Assessment: Normal   Communication Communication Communication: Hearing impairment   Cognition Arousal: Alert Behavior During Therapy: WFL for tasks assessed/performed Overall Cognitive Status: Within Functional Limits for tasks assessed                                                        Home Living Family/patient expects to be discharged to:: Private residence Living Arrangements: Children Available Help at Discharge: Family Type of Home: Mobile home Home Access: Stairs to enter Secretary/administrator of Steps: 2 Entrance Stairs-Rails: Can reach both (R rail rotted) Home Layout: One level     Bathroom Shower/Tub: Chief Strategy Officer: Handicapped height Bathroom Accessibility: No   Home Equipment: Agricultural consultant (2 wheels);BSC/3in1;Wheelchair - manual   Additional Comments: Pt is alone around 8 hours a day      Prior Functioning/Environment Prior Level of Function : Needs assist       Physical Assist : ADLs (physical)   ADLs (physical): Bathing;Dressing;IADLs Mobility Comments: Ambulates with RW ADLs Comments: Needs a little assist for bathing and dressing.        OT Problem List: Decreased  strength;Decreased range of motion;Decreased activity tolerance;Impaired balance (sitting and/or standing)      OT Treatment/Interventions: Self-care/ADL training;Therapeutic exercise;Therapeutic activities;Patient/family education    OT Goals(Current goals can be found in the care plan section) Acute Rehab OT Goals Patient Stated Goal: return home OT Goal Formulation: With patient Time For Goal Achievement: 09/24/23 Potential to Achieve Goals: Good  OT Frequency: Min 2X/week    Co-evaluation PT/OT/SLP Co-Evaluation/Treatment: Yes Reason for Co-Treatment: To address functional/ADL transfers   OT goals addressed during session: ADL's and self-care                       End of Session Equipment Utilized During Treatment: Rolling walker (2 wheels);Oxygen  Activity Tolerance: Patient tolerated treatment well Patient left: in chair;with call bell/phone within reach  OT Visit Diagnosis: Unsteadiness on feet (R26.81);Other abnormalities of gait and mobility (R26.89);Muscle weakness (generalized) (M62.81)                Time: 4098-1191 OT Time Calculation (min): 18 min Charges:  OT General Charges $OT Visit: 1 Visit OT Evaluation $OT Eval Low Complexity: 1 Low  Anny Sayler OT, MOT   Danie Chandler 09/10/2023, 9:20 AM

## 2023-09-10 NOTE — Consult Note (Signed)
WOC Nurse Consult Note: patient states she has had recurrent ulceration to R lower leg; has seen PCP and dermatology in the past who ordered a cream  Reason for Consult: venous stasis B lower legs  Wound type: scattered partial thickness skin loss to B lower legs Pressure Injury POA: NA  Measurement: R lower anterior leg with a 1 cm x 1 cm ulcer that is 100% pink and dry, multiple scabbed areas noted to B lower legs  Wound bed: 100% pink dry  Drainage (amount, consistency, odor) none, both legs dry  Periwound: hyperkeratotic skin, chronic dark discoloration  Dressing procedure/placement/frequency: Apply Eucerin 2 times daily to skin of B lower legs and feet.  DO NOT PLACE IN BETWEEN TOES.  If patient desires wrapping can cover anterior lower legs with ABD pads and wrap with Kerlix roll gauze beginning above toes and ending right below knees.  Ok to leave open to air if patient in agreement.   POC discussed with bedside nurse. WOC team will not follow. Re-consult if further needs arise.   Thank you,    Priscella Mann MSN, RN-BC, [employer] [phone]

## 2023-09-10 NOTE — Progress Notes (Signed)
Progress Note   Patient: [name] YQM:[id number] DOB: 08/09/1951 DOA: 09/09/2023     1 DOS: the patient was seen and examined on 09/10/2023   Brief hospital course: [name] is a 72 y.o. female with medical history significant of chronic diastolic heart failure, atrial fibrillation, history of aortic valve replacement (mechanical valve) chronically on Coumadin, history of COPD, chronic respiratory failure (on 2 L at baseline), gastroesophageal reflux disease, hypothyroidism, Paget's disease and restless leg syndrome; who presented to the hospital secondary to worsening shortness of breath.  Symptoms has been present for the last 3-4 days and worsening.  Family noticed decrease in care station capacity due to shortness of breath and also increased requirement for oxygen supplementation.  At home bronchodilator management has failed to improve symptoms.   Patient reports dry coughing spells, increased wheezing and decreased appetite.  No fever, no chest pain, no abdominal pain, no dysuria, no hematuria, no sick contacts, no focal weaknesses or any other complaints.   Per family at bedside patient actively seeing for positive left nipple changes and breast discharge.   Nebulizer management, IV antibiotics and IV Lasix initiated at presentation.  COVID PCR negative.   CT chest: Demonstrating moderate cardiomegaly, enlarged pulmonary arteries suggesting pulmonary hypertension and the presence of mild bilateral pleural effusion along with atelectasis/bronchitic changes on the adjacent lower lobes.  Mild bilateral pulmonary edema appreciated.  Assessment and Plan: 1-acute on chronic respiratory failure with hypoxia -Appears to be multifactorial in the setting of COPD exacerbation and CHF exacerbation -Patient has required the use of BiPAP at time of admission in order to maintain saturation -Chronically on 2 L nasal cannula supplementation. -Still tight and wheezing on  examination -Continue treatment with steroids, nebulizer management, IV diuresis, oral antibiotics, low-sodium diet, close monitoring of her volume with daily weights and strict I's and O's and follow clinical response. -Wean off oxygen supplementation as tolerated to baseline -Still requiring BiPAP intermittently.  2-chronic kidney disease stage IIIa -Per GFR examination appears to be stage IIIa, with borderline transition to a stage IIIb. -For the most part stable -Continue minimizing nephrotoxic agents and follow renal function trend with acute diuresis -Avoid hypotension and the use of contrast.  3-essential hypertension Continue to follow vital sign -Continue current antihypertensive agents.  4-DC -Continue Synthroid. -TSH within normal limits.  5-gastroesophageal flux disease -Continue PPI.  6-COPD exacerbation -Continue treatment with steroids and nebulizer management as mentioned above.  7-acute on chronic diastolic heart failure -Continue treatment as mentioned above with IV diuresis -Update 2D echo -Continue low-sodium diet and follow daily weights/strict I's and O's.  8-history of mechanical heart valve replacement -Continue Coumadin per pharmacy.  9-hyperlipidemia -Continue statin.  10-history of chronic atrial fibrillation -Rate for the most part controlled -Still demonstrating atrial fibrillation breathing. -Continue Cardizem and bisoprolol -Continue Coumadin for secondary prevention.  Subjective:  No chest pain, no nausea, no vomiting.  Patient is afebrile.  Still demonstrating intermittent difficulty from respiratory standpoint, tight, wheezy and unable to speak in full sentences.  Physical Exam: Vitals:   09/10/23 1400 09/10/23 1500 09/10/23 1554 09/10/23 1600  BP: 106/62 106/70  (!) 120/98  Pulse: 78 76  78  Resp: 18 16  (!) 21  Temp:   (!) 97.5 F (36.4 C)   TempSrc:   Oral   SpO2: 95% 95%    Weight:      Height:       General exam: Alert,  awake, oriented x 3; demonstrating difficulty to speak  in full sentences, still requiring intermittent use of BiPAP and being tighter/wheezy on exam. Respiratory system: Intermittent tachypnea; positive wheezing, decreased breath sounds at the bases with fine crackles appreciated.  Positive rhonchi. Cardiovascular system: Rate controlled, no rubs, no gallops, no JVD. Gastrointestinal system: Abdomen is nondistended, soft and nontender. No organomegaly or masses felt. Normal bowel sounds heard. Central nervous system: No focal neurological deficits. Extremities: No cyanosis or clubbing.  Trace edema appreciated bilaterally. Skin: No petechiae; chronic excessive dermatitis appreciated especially in her right lower extremity.  No signs of acute infection. Psychiatry: Judgement and insight appear normal. Mood & affect appropriate.   Data Reviewed: Basic metabolic panel: Sodium 133, potassium 3.8, chloride 88, bicarb 36, BUN 16, creatinine 1.19 and GFR 49 ZOX:WRUE 7.3, hemoglobin 9.1 and platelet count 203K Prothrombin time and-INR: 18.9/1.6 respectively.  Family Communication: No family at bedside.  Disposition: Status is: Inpatient Remains inpatient appropriate because: Continue steroids and IV diuresis.   Planned Discharge Destination: Home   Time spent: 55 minutes  Author: Vassie Loll, MD 09/10/2023 4:56 PM  For on call review [email or web address].

## 2023-09-10 NOTE — Plan of Care (Signed)
  Problem: Acute Rehab PT Goals(only PT should resolve) Goal: Pt Will Go Supine/Side To Sit Outcome: Progressing Flowsheets (Taken 09/10/2023 1206) Pt will go Supine/Side to Sit:  with modified independence  with supervision Goal: Patient Will Transfer Sit To/From Stand Outcome: Progressing Flowsheets (Taken 09/10/2023 1206) Patient will transfer sit to/from stand:  with modified independence  with supervision Goal: Pt Will Transfer Bed To Chair/Chair To Bed Outcome: Progressing Flowsheets (Taken 09/10/2023 1206) Pt will Transfer Bed to Chair/Chair to Bed:  with modified independence  with supervision Goal: Pt Will Ambulate Outcome: Progressing Flowsheets (Taken 09/10/2023 1206) Pt will Ambulate:  50 feet  with supervision  with contact guard assist  with rolling walker   12:06 PM, 09/10/23 Ocie Bob, MPT Physical Therapist with [facility] [phone] office [phone] mobile phone

## 2023-09-10 NOTE — Progress Notes (Signed)
*  PRELIMINARY RESULTS* Echocardiogram 2D Echocardiogram has been performed with Definity.  Stacey Drain 09/10/2023, 4:32 PM

## 2023-09-11 DIAGNOSIS — I482 Chronic atrial fibrillation, unspecified: Secondary | ICD-10-CM | POA: Diagnosis not present

## 2023-09-11 DIAGNOSIS — J9621 Acute and chronic respiratory failure with hypoxia: Secondary | ICD-10-CM | POA: Diagnosis not present

## 2023-09-11 DIAGNOSIS — E785 Hyperlipidemia, unspecified: Secondary | ICD-10-CM | POA: Diagnosis not present

## 2023-09-11 DIAGNOSIS — I5033 Acute on chronic diastolic (congestive) heart failure: Secondary | ICD-10-CM | POA: Diagnosis not present

## 2023-09-11 LAB — PROTIME-INR
INR: 1.9 — ABNORMAL HIGH (ref 0.8–1.2)
Prothrombin Time: 22.4 s — ABNORMAL HIGH (ref 11.4–15.2)

## 2023-09-11 LAB — BASIC METABOLIC PANEL
Anion gap: 10 (ref 5–15)
BUN: 27 mg/dL — ABNORMAL HIGH (ref 8–23)
CO2: 36 mmol/L — ABNORMAL HIGH (ref 22–32)
Calcium: 8.9 mg/dL (ref 8.9–10.3)
Chloride: 87 mmol/L — ABNORMAL LOW (ref 98–111)
Creatinine, Ser: 1.7 mg/dL — ABNORMAL HIGH (ref 0.44–1.00)
GFR, Estimated: 32 mL/min — ABNORMAL LOW (ref >60)
Glucose, Bld: 99 mg/dL (ref 70–99)
Potassium: 4.4 mmol/L (ref 3.5–5.1)
Sodium: 133 mmol/L — ABNORMAL LOW (ref 135–145)

## 2023-09-11 MED ORDER — LORAZEPAM 2 MG/ML IJ SOLN
0.5000 mg | Freq: Three times a day (TID) | INTRAMUSCULAR | Status: DC | PRN
  Administered 2023-09-11 – 2023-09-20 (×11): 0.5 mg via INTRAVENOUS
  Filled 2023-09-11 (×11): qty 1

## 2023-09-11 MED ORDER — FUROSEMIDE 10 MG/ML IJ SOLN
40.0000 mg | Freq: Every day | INTRAMUSCULAR | Status: DC
  Administered 2023-09-11: 40 mg via INTRAVENOUS
  Filled 2023-09-11: qty 4

## 2023-09-11 MED ORDER — BUDESONIDE 0.5 MG/2ML IN SUSP
1.0000 mg | Freq: Two times a day (BID) | RESPIRATORY_TRACT | Status: DC
  Administered 2023-09-11 – 2023-09-20 (×20): 1 mg via RESPIRATORY_TRACT
  Administered 2023-09-21: 0.5 mg via RESPIRATORY_TRACT
  Administered 2023-09-21: 1 mg via RESPIRATORY_TRACT
  Administered 2023-09-22: 0.5 mg via RESPIRATORY_TRACT
  Administered 2023-09-22 – 2023-09-23 (×2): 1 mg via RESPIRATORY_TRACT
  Filled 2023-09-11 (×27): qty 4

## 2023-09-11 MED ORDER — WARFARIN SODIUM 2 MG PO TABS
3.0000 mg | ORAL_TABLET | Freq: Once | ORAL | Status: AC
  Administered 2023-09-11: 3 mg via ORAL
  Filled 2023-09-11: qty 1

## 2023-09-11 NOTE — Evaluation (Signed)
Clinical/Bedside Swallow Evaluation Patient Details  Name: [name] MRN: [id number] Date of Birth: 03/09/1951  Today's Date: 09/11/2023 Time: SLP Start Time (ACUTE ONLY): 1140 SLP Stop Time (ACUTE ONLY): 1213 SLP Time Calculation (min) (ACUTE ONLY): 33 min  Past Medical History:  Past Medical History:  Diagnosis Date   Anxiety disorder    Aortic stenosis    Status post St. Jude mechanical AVR 2007   Asthma    Atrial fibrillation (HCC)    Carcinoid tumor of colon 2007   Chronic diastolic heart failure (HCC)    Coronary atherosclerosis of native coronary artery    Status post CABG 2007   GERD (gastroesophageal reflux disease)    History of colonoscopy 2003   Dr. 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 Jena Gauss, benign biopsies   RLS (restless legs syndrome)    Schatzki's ring    Past Surgical History:  Past Surgical History:  Procedure Laterality Date   ABDOMINAL HYSTERECTOMY     AORTIC VALVE REPLACEMENT  2007   #25 mm St. Jude mechanical prosthesis with Hemashield tube graft repair of ascending aneurysm   APPENDECTOMY  2007   BACK SURGERY     lumbar 4 and 5    BIOPSY  02/05/2012   RMR:Two tongues of salmon-colored epithelium distal esophagus, very short-segment Barrett's s/p bx/Small hiatal hernia, otherwise normal stomach, D1, D2. Status post esophageal dilation. Biopsy showed GERD.   Breast cyst removed     bilateral   BREAST REDUCTION SURGERY     CESAREAN SECTION     COLON SURGERY  01/2006   Secondary ? Appendiceal carcinoid   COLONOSCOPY  02/05/2012   EXB:MWUXLK rectum, sigmoid diverticulosis,descending colon polyp , tubular adenoma   CORONARY ARTERY BYPASS GRAFT     06/2006 - RIMA to RCA, SVG to RCA   ESOPHAGOGASTRODUODENOSCOPY  10/03/2010   Dr. Jena Gauss- schatzki's ring, shoft peptic stricture at GE junction.   ESOPHAGOGASTRODUODENOSCOPY (EGD) WITH PROPOFOL N/A 02/08/2018    Procedure: ESOPHAGOGASTRODUODENOSCOPY (EGD) WITH PROPOFOL;  Surgeon: Corbin Ade, MD;  Location: [facility];  Service: Endoscopy;  Laterality: N/A;  8:15am   FOOT SURGERY     bilateral bunionectomy   HERNIA REPAIR     with mesh   LAPAROTOMY  2007   small bowel resection secondary to small bowel obstruction   MALONEY DILATION  02/05/2012   Procedure: Elease Hashimoto DILATION;  Surgeon: Corbin Ade, MD;  Location: [facility];  Service: Endoscopy;  Laterality: N/A;  56mm    MALONEY DILATION N/A 02/08/2018   Procedure: Elease Hashimoto DILATION;  Surgeon: Corbin Ade, MD;  Location: [facility];  Service: Endoscopy;  Laterality: N/A;   ORIF TIBIA PLATEAU Right 06/18/2023   Procedure: OPEN REDUCTION INTERNAL FIXATION (ORIF) TIBIAL PLATEAU;  Surgeon: Oliver Barre, MD;  Location: [facility];  Service: Orthopedics;  Laterality: Right;   Teeth removal     HPI:  [name] is a 72 y.o. female with medical history significant of chronic diastolic heart failure, atrial fibrillation, history of aortic valve replacement (mechanical valve) chronically on Coumadin, history of COPD, chronic respiratory failure (on 2 L at baseline), gastroesophageal reflux disease, hypothyroidism, Paget's disease and restless leg syndrome; who presented to the hospital secondary to worsening shortness of breath.  Symptoms has been present for the last 3-4 days and worsening.  Family noticed decrease in care station capacity  due to shortness of breath and also increased requirement for oxygen supplementation.  At home bronchodilator management has failed to improve symptoms.    Assessment / Plan / Recommendation  Clinical Impression  Clinical swallowing evaluation completed while Pt was consuming her regular lunch tray. Pt has been on and off Bi-PAP the past 24 hours and O2 currently at 85 on 10L/mn prior to any PO consumption. Pt consumed thin liquids and soft textures without overt s/sx of oropharyngeal dysphagia, however  pt did  desaturate to 83 at one point but then came back up to 90 shortly after - Per RN her O2 has been hovering to low 80s on and off this am. Pt with a reported episode of desaturation after breakfast followed by an episode of emesis this morning. Unfortunately there are many contributing factors including COPD, baseline congested cough, compromised respiratory status, esophageal dysphagia and deconditioned status that all potentially increase risk of aspiration.  Reviewed energy conservation strategies with Pt and Pt's son, recommended Pt sit upright, utilize slow consumption rate and take breaks to breathe during meals. Recommend continue with regular diet and thin liquids with the above strategies. ST will continue to follow acutely for ongoing dysphagia therapy and can complete MBSS next week if indicated and MD would like. Thank you for this referral, SLP Visit Diagnosis: Dysphagia, unspecified (R13.10)    Aspiration Risk  Moderate aspiration risk    Diet Recommendation Regular;Thin liquid    Liquid Administration via: Cup;Straw Medication Administration: Whole meds with liquid Supervision: Patient able to self feed Compensations: Minimize environmental distractions;Slow rate;Small sips/bites Postural Changes: Seated upright at 90 degrees    Other  Recommendations Oral Care Recommendations: Oral care BID    Recommendations for follow up therapy are one component of a multi-disciplinary discharge planning process, led by the attending physician.  Recommendations may be updated based on patient status, additional functional criteria and insurance authorization.           Frequency and Duration min 1 x/week  1 week       Prognosis Prognosis for improved oropharyngeal function: Fair      Swallow Study   General Date of Onset: 09/09/23 HPI: [name] is a 72 y.o. female with medical history significant of chronic diastolic heart failure, atrial fibrillation, history of aortic valve  replacement (mechanical valve) chronically on Coumadin, history of COPD, chronic respiratory failure (on 2 L at baseline), gastroesophageal reflux disease, hypothyroidism, Paget's disease and restless leg syndrome; who presented to the hospital secondary to worsening shortness of breath.  Symptoms has been present for the last 3-4 days and worsening.  Family noticed decrease in care station capacity due to shortness of breath and also increased requirement for oxygen supplementation.  At home bronchodilator management has failed to improve symptoms. Type of Study: Bedside Swallow Evaluation Previous Swallow Assessment: none in chart Diet Prior to this Study: Regular;Thin liquids (Level 0) Temperature Spikes Noted: No Respiratory Status: Nasal cannula History of Recent Intubation: No Behavior/Cognition: Alert;Cooperative;Pleasant mood Oral Cavity Assessment: Within Functional Limits Oral Care Completed by SLP: Recent completion by staff Oral Cavity - Dentition: Adequate natural dentition Vision: Functional for self-feeding Self-Feeding Abilities: Able to feed self Patient Positioning: Upright in bed Baseline Vocal Quality: Normal Volitional Cough: Strong Volitional Swallow: Able to elicit    Oral/Motor/Sensory Function Overall Oral Motor/Sensory Function: Within functional limits   Ice Chips Ice chips: Within functional limits   Thin Liquid Thin Liquid: Within functional limits    Nectar Thick  Nectar Thick Liquid: Not tested   Honey Thick Honey Thick Liquid: Not tested   Puree Puree: Within functional limits   Solid     Solid: Within functional limits     [name] H. Romie Levee, CCC-SLP Speech Language Pathologist  Georgetta Haber 09/11/2023,12:13 PM

## 2023-09-11 NOTE — Progress Notes (Signed)
Progress Note   Patient: [name] NGE:[id number] DOB: 27-Dec-1950 DOA: 09/09/2023     2 DOS: the patient was seen and examined on 09/11/2023   Brief hospital course: [name] is a 72 y.o. female with medical history significant of chronic diastolic heart failure, atrial fibrillation, history of aortic valve replacement (mechanical valve) chronically on Coumadin, history of COPD, chronic respiratory failure (on 2 L at baseline), gastroesophageal reflux disease, hypothyroidism, Paget's disease and restless leg syndrome; who presented to the hospital secondary to worsening shortness of breath.  Symptoms has been present for the last 3-4 days and worsening.  Family noticed decrease in care station capacity due to shortness of breath and also increased requirement for oxygen supplementation.  At home bronchodilator management has failed to improve symptoms.   Patient reports dry coughing spells, increased wheezing and decreased appetite.  No fever, no chest pain, no abdominal pain, no dysuria, no hematuria, no sick contacts, no focal weaknesses or any other complaints.   Per family at bedside patient actively seeing for positive left nipple changes and breast discharge.   Nebulizer management, IV antibiotics and IV Lasix initiated at presentation.  COVID PCR negative.   CT chest: Demonstrating moderate cardiomegaly, enlarged pulmonary arteries suggesting pulmonary hypertension and the presence of mild bilateral pleural effusion along with atelectasis/bronchitic changes on the adjacent lower lobes.  Mild bilateral pulmonary edema appreciated.  Assessment and Plan: 1-acute on chronic respiratory failure with hypoxia -Appears to be multifactorial in the setting of COPD exacerbation and CHF exacerbation -Patient has required the use of BiPAP at time of admission in order to maintain saturation -Chronically on 2 L nasal cannula supplementation. -Still tight and wheezing on  examination -Continue treatment with steroids, nebulizer management, IV diuresis, oral antibiotics, low-sodium diet, close monitoring of her volume with daily weights and strict I's and O's and follow clinical response. -Wean off oxygen supplementation as tolerated to baseline -Still requiring BiPAP intermittently.  2-chronic kidney disease stage IIIa -Per GFR examination appears to be stage IIIa, with borderline transition to a stage IIIb. -For the most part stable; given improvement in patient's volume status Lasix will be changed to once a day. -Continue minimizing nephrotoxic agents and follow renal function trend with acute diuresis -Avoid hypotension and the use of contrast.  3-essential hypertension Continue to follow vital sign -Continue current antihypertensive agents.  4-DC -Continue Synthroid. -TSH within normal limits.  5-gastroesophageal flux disease -Continue PPI.  6-COPD exacerbation -Continue treatment with steroids and nebulizer management as mentioned above.  7-acute on chronic diastolic heart failure -Continue treatment as mentioned above with IV diuresis -Update 2D echo -Continue low-sodium diet and follow daily weights/strict I's and O's.  8-history of mechanical heart valve replacement -Continue Coumadin per pharmacy.  9-hyperlipidemia -Continue statin.  10-history of chronic atrial fibrillation -Rate for the most part controlled -Still demonstrating atrial fibrillation breathing. -Continue Cardizem and bisoprolol -Continue Coumadin for secondary prevention.  Subjective:  No chest pain, no nausea, no vomiting.  Still demonstrating difficulty speaking in full sentences and requiring BiPAP intermittently.   Physical Exam: Vitals:   09/11/23 1419 09/11/23 1421 09/11/23 1543 09/11/23 1600  BP:    103/72  Pulse: 87 85  80  Resp: 10 (!) 24  19  Temp:   97.6 F (36.4 C)   TempSrc:   Axillary   SpO2: 100% 100%  (!) 88%  Weight:      Height:        General exam: Afebrile; no chest pain,  no nausea, no vomiting.  Still short winded and requiring BIPAP intermittently. Respiratory system: positive rhonchi, positive wheezing and decrease BS at the bases. Cardiovascular system: Rate controlled, no rubs, no gallops, no JVD. Gastrointestinal system: Abdomen is nondistended, soft and nontender. No organomegaly or masses felt. Normal bowel sounds heard. Central nervous system: Alert and oriented. No focal neurological deficits. Extremities: No cyanosis or clubbing; no edema. Skin: No petechiae; chronic stasis dermatitis appreciated in her right lower extremity; no signs of acute infection or drainage. Psychiatry: Judgement and insight appear normal. Mood & affect appropriate.   Data Reviewed: Basic metabolic panel: Sodium 133, potassium 4.4, chloride 87, bicarb 36, BUN 27, creatinine 1.70 and GFR 32. Latest BMW:UXLK 7.3, hemoglobin 9.1 and platelet count 203K INR: 1.9; prothrombin time: 22.4  Family Communication: No family at bedside.  Disposition: Status is: Inpatient Remains inpatient appropriate because: Continue steroids and IV diuresis.  Planned Discharge Destination: Home  Time spent: 55 minutes  Author: Vassie Loll, MD 09/11/2023 4:48 PM  For on call review [email or web address].

## 2023-09-11 NOTE — Progress Notes (Signed)
Patient noted with heart rate in the lower 40s. To bedside to assess. Patient had pulled her oxygen off and was only responsive to pain stimuli. Patient placed on bipap at 100% fiO2. Dr. Gwenlyn Perking made aware. Patient appears to be recovering adequately. Respiratory at bedside.

## 2023-09-11 NOTE — Progress Notes (Signed)
PHARMACY - ANTICOAGULATION CONSULT NOTE  Pharmacy Consult for Coumadin Indication:  aortic mechanical valve    Allergies  Allergen Reactions   Penicillin G Itching and Other (See Comments)   Penicillins Hives, Itching, Swelling and Rash    Patient Measurements: Height: 5\' 8"  (172.7 cm) Weight: 68.7 kg (151 lb 7.3 oz) IBW/kg (Calculated) : 63.9   Vital Signs: Temp: 98.2 F (36.8 C) (11/22 0730) Temp Source: Oral (11/22 0730) BP: 118/61 (11/22 0900) Pulse Rate: 79 (11/22 0900)  Labs: Recent Labs    09/09/23 0942 09/09/23 1210 09/10/23 0442 09/11/23 0502  HGB 10.4*  --  9.1*  --   HCT 33.7*  --  30.7*  --   PLT 191  --  203  --   LABPROT 18.1*  --  18.9* 22.4*  INR 1.5*  --  1.6* 1.9*  CREATININE 1.17*  --  1.19* 1.70*  TROPONINIHS 19* 20*  --   --     Estimated Creatinine Clearance: 30.6 mL/min (A) (by C-G formula based on SCr of 1.7 mg/dL (H)).   Medical History: Past Medical History:  Diagnosis Date   Anxiety disorder    Aortic stenosis    Status post St. Jude mechanical AVR 2007   Asthma    Atrial fibrillation (HCC)    Carcinoid tumor of colon 2007   Chronic diastolic heart failure (HCC)    Coronary atherosclerosis of native coronary artery    Status post CABG 2007   GERD (gastroesophageal reflux disease)    History of colonoscopy 2003   Dr. 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 Jena Gauss, benign biopsies   RLS (restless legs syndrome)    Schatzki's ring    Assessment: Patient presented with SOB. Pharmacy consulted to dose warfarin in patient with atrial fibrillation and mechanical AVR. Patient's warfarin home dose listed as 4 mg on Tuesday and 3 mg ROW.   INR is still subtherapeutic at 1.9 this morning (trending up from 1.6 11/21 am). Gave a boosted dose last night.   Goal of Therapy:  INR 2-3 Monitor platelets by anticoagulation protocol: Yes   Plan:   Coumadin 3mg  po x 1 today Daily PT-INR Monitor for S/S of bleeding  Sheppard Coil PharmD., BCPS Clinical Pharmacist 09/11/2023 11:42 AM

## 2023-09-11 NOTE — Care Management Important Message (Signed)
Important Message  Patient Details  Name: [name] MRN: [id number] Date of Birth: Aug 29, 1951   Important Message Given:  Yes - Medicare IM     Corey Harold 09/11/2023, 10:48 AM

## 2023-09-12 DIAGNOSIS — J9621 Acute and chronic respiratory failure with hypoxia: Secondary | ICD-10-CM | POA: Diagnosis not present

## 2023-09-12 LAB — BASIC METABOLIC PANEL
Anion gap: 13 (ref 5–15)
BUN: 35 mg/dL — ABNORMAL HIGH (ref 8–23)
CO2: 34 mmol/L — ABNORMAL HIGH (ref 22–32)
Calcium: 8.7 mg/dL — ABNORMAL LOW (ref 8.9–10.3)
Chloride: 86 mmol/L — ABNORMAL LOW (ref 98–111)
Creatinine, Ser: 2.21 mg/dL — ABNORMAL HIGH (ref 0.44–1.00)
GFR, Estimated: 23 mL/min — ABNORMAL LOW (ref >60)
Glucose, Bld: 89 mg/dL (ref 70–99)
Potassium: 4.3 mmol/L (ref 3.5–5.1)
Sodium: 133 mmol/L — ABNORMAL LOW (ref 135–145)

## 2023-09-12 LAB — PROTIME-INR
INR: 2.4 — ABNORMAL HIGH (ref 0.8–1.2)
Prothrombin Time: 26.6 s — ABNORMAL HIGH (ref 11.4–15.2)

## 2023-09-12 LAB — BLOOD GAS, ARTERIAL
Acid-Base Excess: 8.1 mmol/L — ABNORMAL HIGH (ref 0.0–2.0)
Bicarbonate: 37.3 mmol/L — ABNORMAL HIGH (ref 20.0–28.0)
Drawn by: 22179
O2 Saturation: 80.3 %
Patient temperature: 36.4
pCO2 arterial: 72 mm[Hg] (ref 32–48)
pH, Arterial: 7.32 — ABNORMAL LOW (ref 7.35–7.45)
pO2, Arterial: 45 mm[Hg] — ABNORMAL LOW (ref 83–108)

## 2023-09-12 MED ORDER — LEVALBUTEROL HCL 0.63 MG/3ML IN NEBU
0.6300 mg | INHALATION_SOLUTION | Freq: Four times a day (QID) | RESPIRATORY_TRACT | Status: DC
  Administered 2023-09-13 – 2023-09-22 (×37): 0.63 mg via RESPIRATORY_TRACT
  Filled 2023-09-12 (×37): qty 3

## 2023-09-12 MED ORDER — WARFARIN SODIUM 2.5 MG PO TABS
2.5000 mg | ORAL_TABLET | Freq: Once | ORAL | Status: AC
  Administered 2023-09-12: 2.5 mg via ORAL
  Filled 2023-09-12: qty 1

## 2023-09-12 NOTE — Progress Notes (Signed)
PHARMACY - ANTICOAGULATION CONSULT NOTE  Pharmacy Consult for Coumadin Indication:  aortic mechanical valve    Allergies  Allergen Reactions   Penicillin G Itching and Other (See Comments)   Penicillins Hives, Itching, Swelling and Rash    Patient Measurements: Height: 5\' 8"  (172.7 cm) Weight: 70.6 kg (155 lb 10.3 oz) IBW/kg (Calculated) : 63.9   Vital Signs: Temp: 97.6 F (36.4 C) (11/23 0817) Temp Source: Axillary (11/23 0817) BP: 110/66 (11/23 0700) Pulse Rate: 79 (11/23 0700)  Labs: Recent Labs    09/09/23 1210 09/10/23 0442 09/11/23 0502 09/12/23 0419  HGB  --  9.1*  --   --   HCT  --  30.7*  --   --   PLT  --  203  --   --   LABPROT  --  18.9* 22.4* 26.6*  INR  --  1.6* 1.9* 2.4*  CREATININE  --  1.19* 1.70* 2.21*  TROPONINIHS 20*  --   --   --     Estimated Creatinine Clearance: 23.6 mL/min (A) (by C-G formula based on SCr of 2.21 mg/dL (H)).   Medical History: Past Medical History:  Diagnosis Date   Anxiety disorder    Aortic stenosis    Status post St. Jude mechanical AVR 2007   Asthma    Atrial fibrillation (HCC)    Carcinoid tumor of colon 2007   Chronic diastolic heart failure (HCC)    Coronary atherosclerosis of native coronary artery    Status post CABG 2007   GERD (gastroesophageal reflux disease)    History of colonoscopy 2003   Dr. 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 Jena Gauss, benign biopsies   RLS (restless legs syndrome)    Schatzki's ring    Assessment: Patient presented with SOB. Pharmacy consulted to dose warfarin in patient with atrial fibrillation and mechanical AVR. Patient's warfarin home dose listed as 4 mg on Tuesday and 3 mg ROW.   INR is now at goal at 2.4 this morning (trending up from 1.6 11/21 am). Transitioned back to home dose last night. Will give a little less today.   Goal of Therapy:  INR 2-3 Monitor platelets by  anticoagulation protocol: Yes   Plan:  Coumadin 2.5mg  po x 1 today Daily PT-INR Monitor for S/S of bleeding  Sheppard Coil PharmD., BCPS Clinical Pharmacist 09/12/2023 9:49 AM

## 2023-09-12 NOTE — Progress Notes (Signed)
Progress Note   Patient: [name] DOB: 05-17-1951 DOA: 09/09/2023     3 DOS: the patient was seen and examined on 09/12/2023   Brief hospital course: [name] is a 72 y.o. female with medical history significant of chronic diastolic heart failure, atrial fibrillation, history of aortic valve replacement (mechanical valve) chronically on Coumadin, history of COPD, chronic respiratory failure (on 2 L at baseline), gastroesophageal reflux disease, hypothyroidism, Paget's disease and restless leg syndrome; who presented to the hospital secondary to worsening shortness of breath.  Symptoms has been present for the last 3-4 days and worsening.  Family noticed decrease in care station capacity due to shortness of breath and also increased requirement for oxygen supplementation.  At home bronchodilator management has failed to improve symptoms.   Patient reports dry coughing spells, increased wheezing and decreased appetite.  No fever, no chest pain, no abdominal pain, no dysuria, no hematuria, no sick contacts, no focal weaknesses or any other complaints.   Per family at bedside patient actively seeing for positive left nipple changes and breast discharge.   Nebulizer management, IV antibiotics and IV Lasix initiated at presentation.  COVID PCR negative.   CT chest: Demonstrating moderate cardiomegaly, enlarged pulmonary arteries suggesting pulmonary hypertension and the presence of mild bilateral pleural effusion along with atelectasis/bronchitic changes on the adjacent lower lobes.  Mild bilateral pulmonary edema appreciated.  Assessment and Plan: 1-acute on chronic respiratory failure with hypoxia -Appears to be multifactorial in the setting of COPD exacerbation and CHF exacerbation -Hypoxia worsening requiring high flow oxygen at 10 L alternating with BiPAP -At baseline usually uses 3 and half liters of oxygen via nasal cannula prior to admission -Continue treatment  with steroids, nebulizer management,  antibiotics, low-sodium diet, close monitoring of her volume with daily weights and strict I's and O's and follow clinical response.  2-AKI----acute kidney injury on CKD stage -3A -Creatinine is up to 2.21 from 0.9  -Give diuretic holiday -renally adjust medications, avoid nephrotoxic agents / dehydration  / hypotension   3-essential hypertension Continue to follow vital sign -Continue current antihypertensive agents.  4-DC -Continue Synthroid. -TSH within normal limits.  5-gastroesophageal flux disease -Continue PPI.  6-COPD exacerbation -Continue treatment with steroids and nebulizer management as mentioned above.  7-acute on chronic diastolic heart failure -Continue treatment as mentioned above with IV diuresis -Update 2D echo -Continue low-sodium diet and follow daily weights/strict I's and O's.  8-history of mechanical heart valve replacement -Continue Coumadin per pharmacy.  9-hyperlipidemia -Continue statin.  10-history of chronic atrial fibrillation -Rate for the most part controlled -Still demonstrating atrial fibrillation breathing. -Continue Cardizem and bisoprolol -Continue Coumadin for secondary prevention.  Subjective:  -Hypoxia worsening requiring high flow oxygen at 10 L alternating with BiPAP  Physical Exam: Vitals:   09/12/23 1212 09/12/23 1256 09/12/23 1643 09/12/23 1700  BP:    117/72  Pulse:    82  Resp:    (!) 33  Temp: (!) 97.3 F (36.3 C)     TempSrc: Oral     SpO2:  99% 92% 93%  Weight:      Height:        Physical Exam  Gen:- Awake Alert, in no acute distress  HEENT:- [place].AT, No sclera icterus Nose- [place] 10L/min Neck-Supple Neck,No JVD,.  Lungs-  CTAB , fair air movement bilaterally , sternotomy scar CV- S1, S2 normal, RRR, metallic valve click Abd-  +ve B.Sounds, Abd Soft, No tenderness,    Extremity/Skin:- +ve  edema, leg wraps, good pedal pulses  Psych-affect is appropriate, oriented  x3 Neuro-generalized weakness no new focal deficits, no tremors   Family Communication: No family at bedside.  Disposition: Status is: Inpatient Remains inpatient appropriate because: Continue steroids and IV diuresis.  Planned Discharge Destination: Home  Time spent: 55 minutes  Author: Shon Hale, MD 09/12/2023 5:56 PM  For on call review [email or web address].

## 2023-09-12 NOTE — Progress Notes (Signed)
patient breathing has been very labored and shallow, needing continuous BIPAP and not having any good progress on her respiratory status,  has been having desatting episodes very often every time patient is off the BIPAP and doesn't maintain her sats even with 10-15 litres of HFNC,patient also has been confused and agitated,trying to pull her BIPAP and her medical devices off intermittently,   and is very slow to respond to questions,  This RN   had a conversation with her son about her probable and possible need of intubation since she is full code and has been in resp distress the whole time, he mentioned to me about her not wanting to be tubed, but okay for the CPR if needed,  MD made aware about the conversation and her condition, will f/o regarding the code status, will continue to monitor.

## 2023-09-12 NOTE — Progress Notes (Incomplete)
patient breathing has been very labored and shallow, needing continuous BIPAP and not having any good progress on her respiratory status,  has been having desatting episodes very often every time patient is off the BIPAP and doesn't maintain her sats even with 10-15 litres of HFNC, This RN   had a conversation with her son about her probable and possible need of intubation since she is full code and has been in resp distress the whole time, he mentioned to me about her not wanting to be tubed, but okay for the CPR if needed, patient also has been confused and agitated trying to pull her BIPAP and her medical devices off intermittently. Gave her the PRN   and is very slow to respond to questions,  MD made aware about the conversation, will f/o regarding the code status, will continue to monitor.

## 2023-09-13 DIAGNOSIS — J9621 Acute and chronic respiratory failure with hypoxia: Secondary | ICD-10-CM | POA: Diagnosis not present

## 2023-09-13 LAB — BASIC METABOLIC PANEL
Anion gap: 13 (ref 5–15)
BUN: 45 mg/dL — ABNORMAL HIGH (ref 8–23)
CO2: 33 mmol/L — ABNORMAL HIGH (ref 22–32)
Calcium: 8.9 mg/dL (ref 8.9–10.3)
Chloride: 88 mmol/L — ABNORMAL LOW (ref 98–111)
Creatinine, Ser: 2.42 mg/dL — ABNORMAL HIGH (ref 0.44–1.00)
GFR, Estimated: 21 mL/min — ABNORMAL LOW (ref >60)
Glucose, Bld: 76 mg/dL (ref 70–99)
Potassium: 4.8 mmol/L (ref 3.5–5.1)
Sodium: 134 mmol/L — ABNORMAL LOW (ref 135–145)

## 2023-09-13 LAB — BLOOD GAS, ARTERIAL
Acid-Base Excess: 6.4 mmol/L — ABNORMAL HIGH (ref 0.0–2.0)
Bicarbonate: 35.4 mmol/L — ABNORMAL HIGH (ref 20.0–28.0)
Drawn by: 38235
O2 Saturation: 97.7 %
Patient temperature: 37
pCO2 arterial: 72 mm[Hg] (ref 32–48)
pH, Arterial: 7.3 — ABNORMAL LOW (ref 7.35–7.45)
pO2, Arterial: 80 mm[Hg] — ABNORMAL LOW (ref 83–108)

## 2023-09-13 LAB — PROTIME-INR
INR: 3.3 — ABNORMAL HIGH (ref 0.8–1.2)
Prothrombin Time: 33.7 s — ABNORMAL HIGH (ref 11.4–15.2)

## 2023-09-13 MED ORDER — METHYLPREDNISOLONE SODIUM SUCC 125 MG IJ SOLR
80.0000 mg | Freq: Every day | INTRAMUSCULAR | Status: DC
  Administered 2023-09-14 – 2023-09-23 (×10): 80 mg via INTRAVENOUS
  Filled 2023-09-13 (×10): qty 2

## 2023-09-13 MED ORDER — METHYLPREDNISOLONE SODIUM SUCC 125 MG IJ SOLR
125.0000 mg | Freq: Once | INTRAMUSCULAR | Status: AC
  Administered 2023-09-13: 125 mg via INTRAVENOUS
  Filled 2023-09-13: qty 2

## 2023-09-13 NOTE — Progress Notes (Signed)
CRITICAL VALUE STICKER  CRITICAL VALUE:  [id number]  RECEIVER (on-site recipient of call): Lauren RN  MD NOTIFIED: Victorino Dike, MD   TIME OF NOTIFICATION: [phone]

## 2023-09-13 NOTE — Progress Notes (Signed)
Progress Note   Patient: [name] XBM:[id number] DOB: [birth year] DOA: 09/09/2023     4 DOS: the patient was seen and examined on 09/13/2023   Brief hospital course: [name] is a 72 y.o. female with medical history significant of chronic diastolic heart failure, atrial fibrillation, history of aortic valve replacement (mechanical valve) chronically on Coumadin, history of COPD, chronic respiratory failure (on 2 L at baseline), gastroesophageal reflux disease, hypothyroidism, Paget's disease and restless leg syndrome; who presented to the hospital secondary to worsening shortness of breath.  Symptoms has been present for the last 3-4 days and worsening.  Family noticed decrease in care station capacity due to shortness of breath and also increased requirement for oxygen supplementation.  At home bronchodilator management has failed to improve symptoms.   Patient reports dry coughing spells, increased wheezing and decreased appetite.  No fever, no chest pain, no abdominal pain, no dysuria, no hematuria, no sick contacts, no focal weaknesses or any other complaints.   Per family at bedside patient actively seeing for positive left nipple changes and breast discharge.   Nebulizer management, IV antibiotics and IV Lasix initiated at presentation.  COVID PCR negative.   CT chest: Demonstrating moderate cardiomegaly, enlarged pulmonary arteries suggesting pulmonary hypertension and the presence of mild bilateral pleural effusion along with atelectasis/bronchitic changes on the adjacent lower lobes.  Mild bilateral pulmonary edema appreciated.  Assessment and Plan: 1-acute on chronic respiratory failure with hypoxia and hypercapnia -Appears to be multifactorial in the setting of COPD exacerbation and CHF exacerbation -Hypoxia worsening requiring high flow oxygen at 12 L alternating with BiPAP -ABG with hypercapnia and uncompensated respiratory acidosis despite BiPAP use -At baseline  usually uses 3 and half liters of oxygen via nasal cannula prior to admission -Continue treatment with steroids, nebulizer management, doxycycline,  -Continue BiPAP nightly and as needed  2-AKI----acute kidney injury on CKD stage -3A -Creatinine is up to 2.42 from 0.9  =-Currently observing diuretic as a day -Nephrology improved -renally adjust medications, avoid nephrotoxic agents / dehydration  / hypotension   3--acute on chronic diastolic heart failure ---Echo from 09/10/2023 with EF of 65 to 70% , There is the interventricular septum is flattened in systole and diastole, consistent with right ventricular pressure and  volume overload -Diuretics on hold due to AKI -Get cardiology consult  4-hypothyroidism-Continue Synthroid. -TSH within normal limits.  5)s/p  mechanical Aortic  heart valve replacement -Echo from 09/10/2023 shows-25 mm St. Jude mechanical valve present in the aortic position. Aortic valve mean gradient measures 5.0 mmHg.  -Continue Coumadin per pharmacy.  6)GERD--- continue Protonix  7)-COPD exacerbation -Continue treatment with steroids and nebulizer management as mentioned above. -- See #1 above  8)HLD---Continue [place]  9) chronic atrial fibrillation -Continue bisoprolol and Cardizem for rate control -Coumadin for stroke prophylaxis  10) social/ethics--- plan of care and CODE STATUS/advanced directives discussed with patient and patient's son [name] at bedside -They request DNR DNI, No Limitations to treatment otherwise  Subjective:  Son at bedside -Dyspnea and hypoxia persist  Physical Exam: Vitals:   09/13/23 1300 09/13/23 1305 09/13/23 1400 09/13/23 1500  BP: 112/69  105/73 130/67  Pulse: 69  83 98  Resp: (!) 22  (!) 23 (!) 26  Temp:      TempSrc:      SpO2: 92% 94% 91% 91%  Weight:      Height:        Physical Exam Gen:- Awake Alert, in no acute distress  HEENT:- [name].AT,  No sclera icterus Nose- [name] 12L/min Neck-Supple Neck,No JVD,.   Lungs-  CTAB , fair air ovement bilaterally , sternotomy scar CV- S1, S2 normal, RRR, metallic valve click Abd-  +ve B.Sounds, Abd Soft, No tenderness,    Extremity/Skin:- +ve   edema, leg wraps, good pedal pulses  Psych-affect is flat, oriented x3 Neuro-generalized weakness no new focal deficits, no tremors   Family Communication: Son [name] at bedside.  Disposition: TBD Status is: Inpatient   Planned Discharge Destination: Home  Author: Shon Hale, MD 09/13/2023 4:19 PM  For on call review [email or web address].

## 2023-09-13 NOTE — Progress Notes (Signed)
Patient still with low tidal volumes on BIPAP even with pressures of 18/8. RT able to decrease FIO2 to 80%. Drawing ABG to send to lab now.

## 2023-09-13 NOTE — Progress Notes (Signed)
MD made aware of ABG results.

## 2023-09-13 NOTE — Progress Notes (Signed)
  Interdisciplinary Goals of Care Family Meeting   Date carried out: 09/13/2023  Location of the meeting: Bedside  Member's involved: Physician, Bedside Registered Nurse, and Family Member or next of kin  Durable Power of Attorney or acting medical decision maker: Patient and Son [name]    Discussion: We discussed goals of care for [name] .    plan of care and CODE STATUS/advanced directives discussed with patient and patient's son [name] at bedside -They request DNR DNI, No Limitations to treatment otherwise  Code status:   Code Status: Limited: Do not attempt resuscitation (DNR) -DNR-LIMITED -Do Not Intubate/DNI    Disposition: Continue current acute care  Time spent for the meeting: 36   Shon Hale, MD  09/13/2023, 4:35 PM

## 2023-09-13 NOTE — Progress Notes (Signed)
PHARMACY - ANTICOAGULATION CONSULT NOTE  Pharmacy Consult for Coumadin Indication:  aortic mechanical valve    Allergies  Allergen Reactions   Penicillin G Itching and Other (See Comments)   Penicillins Hives, Itching, Swelling and Rash    Patient Measurements: Height: 5\' 8"  (172.7 cm) Weight: 68.5 kg (151 lb 0.2 oz) IBW/kg (Calculated) : 63.9   Vital Signs: Temp: 97.5 F (36.4 C) (11/24 1207) Temp Source: Axillary (11/24 1207) BP: 115/77 (11/24 1200) Pulse Rate: 97 (11/24 1200)  Labs: Recent Labs    09/11/23 0502 09/12/23 0419 09/13/23 0405  LABPROT 22.4* 26.6* 33.7*  INR 1.9* 2.4* 3.3*  CREATININE 1.70* 2.21* 2.42*    Estimated Creatinine Clearance: 21.5 mL/min (A) (by C-G formula based on SCr of 2.42 mg/dL (H)).   Medical History: Past Medical History:  Diagnosis Date   Anxiety disorder    Aortic stenosis    Status post St. Jude mechanical AVR 2007   Asthma    Atrial fibrillation (HCC)    Carcinoid tumor of colon 2007   Chronic diastolic heart failure (HCC)    Coronary atherosclerosis of native coronary artery    Status post CABG 2007   GERD (gastroesophageal reflux disease)    History of colonoscopy 2003   Dr. 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 Jena Gauss, benign biopsies   RLS (restless legs syndrome)    Schatzki's ring    Assessment: Patient presented with SOB. Pharmacy consulted to dose warfarin in patient with atrial fibrillation and mechanical AVR. Patient's warfarin home dose listed as 4 mg on Tuesday and 3 mg ROW.   INR appears very labile and also patient's diet appears to be poor increasing warfarin sensitivity. INR above goal at 3.3 this morning, will hold dose tonight. No bleeding issues noted. Will check cbc in am.   Goal of Therapy:  INR 2-3 Monitor platelets by anticoagulation protocol: Yes   Plan:  No warfarin tonight Daily PT-INR, check cbc in  am Monitor for S/S of bleeding  Sheppard Coil PharmD., BCPS Clinical Pharmacist 09/13/2023 12:16 PM

## 2023-09-13 NOTE — Consult Note (Signed)
PHARMACIST - PHYSICIAN COMMUNICATION   CONCERNING: Methylprednisolone IV    Current order: Methylprednisolone IV 40 mg BID     DESCRIPTION: Per [facility] Protocol:   IV methylprednisolone will be converted to either a q12h or q24h frequency with the same total daily dose (TDD).  Ordered Dose: 1 to 125 mg TDD; convert to: TDD q24h.  Ordered Dose: 126 to 250 mg TDD; convert to: TDD div q12h.  Ordered Dose: >250 mg TDD; DAW.  Order has been adjusted to: Methylprednisolone IV 80 mg daily  Celene Squibb, PharmD Clinical Pharmacist 09/13/2023 4:40 PM

## 2023-09-14 ENCOUNTER — Other Ambulatory Visit (HOSPITAL_COMMUNITY): Payer: Self-pay

## 2023-09-14 ENCOUNTER — Inpatient Hospital Stay (HOSPITAL_COMMUNITY): Payer: Medicare Other

## 2023-09-14 ENCOUNTER — Telehealth (HOSPITAL_COMMUNITY): Payer: Self-pay | Admitting: Pharmacy Technician

## 2023-09-14 DIAGNOSIS — J9621 Acute and chronic respiratory failure with hypoxia: Secondary | ICD-10-CM | POA: Diagnosis not present

## 2023-09-14 DIAGNOSIS — I482 Chronic atrial fibrillation, unspecified: Secondary | ICD-10-CM | POA: Diagnosis not present

## 2023-09-14 DIAGNOSIS — Z515 Encounter for palliative care: Secondary | ICD-10-CM

## 2023-09-14 DIAGNOSIS — I5033 Acute on chronic diastolic (congestive) heart failure: Secondary | ICD-10-CM | POA: Diagnosis not present

## 2023-09-14 DIAGNOSIS — E785 Hyperlipidemia, unspecified: Secondary | ICD-10-CM | POA: Diagnosis not present

## 2023-09-14 LAB — BASIC METABOLIC PANEL
Anion gap: 15 (ref 5–15)
BUN: 58 mg/dL — ABNORMAL HIGH (ref 8–23)
CO2: 31 mmol/L (ref 22–32)
Calcium: 8.9 mg/dL (ref 8.9–10.3)
Chloride: 87 mmol/L — ABNORMAL LOW (ref 98–111)
Creatinine, Ser: 2.42 mg/dL — ABNORMAL HIGH (ref 0.44–1.00)
GFR, Estimated: 21 mL/min — ABNORMAL LOW (ref >60)
Glucose, Bld: 98 mg/dL (ref 70–99)
Potassium: 4.5 mmol/L (ref 3.5–5.1)
Sodium: 133 mmol/L — ABNORMAL LOW (ref 135–145)

## 2023-09-14 LAB — CULTURE, BLOOD (ROUTINE X 2)
Culture: NO GROWTH
Culture: NO GROWTH

## 2023-09-14 LAB — URINALYSIS, ROUTINE W REFLEX MICROSCOPIC
Bacteria, UA: NONE SEEN
Bilirubin Urine: NEGATIVE
Glucose, UA: NEGATIVE mg/dL
Hgb urine dipstick: NEGATIVE
Ketones, ur: NEGATIVE mg/dL
Leukocytes,Ua: NEGATIVE
Nitrite: NEGATIVE
Protein, ur: 30 mg/dL — AB
Specific Gravity, Urine: 1.013 (ref 1.005–1.030)
pH: 5 (ref 5.0–8.0)

## 2023-09-14 LAB — PROTIME-INR
INR: 4.8 (ref 0.8–1.2)
Prothrombin Time: 45.3 s — ABNORMAL HIGH (ref 11.4–15.2)

## 2023-09-14 LAB — CBC
HCT: 33.2 % — ABNORMAL LOW (ref 36.0–46.0)
Hemoglobin: 9.7 g/dL — ABNORMAL LOW (ref 12.0–15.0)
MCH: 27 pg (ref 26.0–34.0)
MCHC: 29.2 g/dL — ABNORMAL LOW (ref 30.0–36.0)
MCV: 92.5 fL (ref 80.0–100.0)
Platelets: 204 10*3/uL (ref 150–400)
RBC: 3.59 MIL/uL — ABNORMAL LOW (ref 3.87–5.11)
RDW: 15.9 % — ABNORMAL HIGH (ref 11.5–15.5)
WBC: 3.7 10*3/uL — ABNORMAL LOW (ref 4.0–10.5)
nRBC: 0 % (ref 0.0–0.2)

## 2023-09-14 LAB — SODIUM, URINE, RANDOM: Sodium, Ur: <10 mmol/L

## 2023-09-14 MED ORDER — IPRATROPIUM BROMIDE 0.02 % IN SOLN
0.5000 mg | Freq: Four times a day (QID) | RESPIRATORY_TRACT | Status: DC
  Administered 2023-09-14 – 2023-09-22 (×32): 0.5 mg via RESPIRATORY_TRACT
  Filled 2023-09-14 (×32): qty 2.5

## 2023-09-14 NOTE — Progress Notes (Signed)
Palliative: Thank you for this consult. Unfortunately due to high volume of consults there will be a delay in a Palliative Provider seeing this patient. Palliative Medicine will return to service on 09/15/2023 and will see patient at that time.  No charge Lillia Carmel, NP Palliative Medicine Please call Palliative Medicine team phone with any questions [phone].

## 2023-09-14 NOTE — Consult Note (Signed)
Cardiology Consultation   Patient ID: [name] MRN: [id number]; DOB: September 20, 1951  Admit date: 09/09/2023 Date of Consult: 09/14/2023  PCP:  Rebekah Chesterfield, NP   [facility] Providers Cardiologist:  Nona Dell, MD        Patient Profile:   [name] is a 72 y.o. female with a hx of aortic stenosis s/p mechanical AVR and ascending aortic aneurysm repair, chronic HFpEF, CAD with prior CABG, CKD 3, permanent afib, chronic respiratory failiure on 2 LNC  who is being seen 09/14/2023 for the evaluation of SOB at the request of Dr Gwenlyn Perking.  History of Present Illness:   [name] s/p mechanical AVR and ascending aortic aneurysm repair, chronic HFpEF, CAD with prior CABG, CKD 3, permanent afib, chronic respiratory failiure on 2 LNC admitted with SOB progressing for 2-3 weeks. Patient on bipap, limited history.    Admit labs Na 132 K 3.5 BUN 14 Cr 1.17 WBC 6.1 Hgb 10.4 Plt 191 BNP 849 Procal <0.10 Mg 2.9 TSH 2. 6 Trop 19-->20 EKG: afib 109, chronic ST/T changes ABG: 7/35/62/80/34 CXR likely pulmonary edema, +bilateral pleural effusions.  CT chest: mild bilateral effusions, possible mild pulm edema,   08/2023 echo: LVEF 65-70%, no WMAs, indet diastolic function, RV pressure and volume overload with D shaped septum, mild RV dysfunction, PASP 44, severe LAE, questionable LA mass   Past Medical History:  Diagnosis Date   Anxiety disorder    Aortic stenosis    Status post St. Jude mechanical AVR 2007   Asthma    Atrial fibrillation (HCC)    Carcinoid tumor of colon 2007   Chronic diastolic heart failure (HCC)    Coronary atherosclerosis of native coronary artery    Status post CABG 2007   GERD (gastroesophageal reflux disease)    History of colonoscopy 2003   Dr. 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 Jena Gauss, benign biopsies   RLS (restless legs syndrome)     Schatzki's ring     Past Surgical History:  Procedure Laterality Date   ABDOMINAL HYSTERECTOMY     AORTIC VALVE REPLACEMENT  2007   #25 mm St. Jude mechanical prosthesis with Hemashield tube graft repair of ascending aneurysm   APPENDECTOMY  2007   BACK SURGERY     lumbar 4 and 5    BIOPSY  02/05/2012   RMR:Two tongues of salmon-colored epithelium distal esophagus, very short-segment Barrett's s/p bx/Small hiatal hernia, otherwise normal stomach, D1, D2. Status post esophageal dilation. Biopsy showed GERD.   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 Jena Gauss- schatzki's ring, shoft peptic stricture at GE junction.   ESOPHAGOGASTRODUODENOSCOPY (EGD) WITH PROPOFOL N/A 02/08/2018   Procedure: ESOPHAGOGASTRODUODENOSCOPY (EGD) WITH PROPOFOL;  Surgeon: Corbin Ade, MD;  [facility]: [facility];  Service: Endoscopy;  Laterality: N/A;  8:15am   FOOT SURGERY     bilateral bunionectomy   HERNIA REPAIR     with mesh   LAPAROTOMY  2007   small  bowel resection secondary to small bowel obstruction   MALONEY DILATION  02/05/2012   Procedure: Elease Hashimoto DILATION;  Surgeon: Corbin Ade, MD;  Location: [facility];  Service: Endoscopy;  Laterality: N/A;  56mm    MALONEY DILATION N/A 02/08/2018   Procedure: Elease Hashimoto DILATION;  Surgeon: Corbin Ade, MD;  Location: [facility];  Service: Endoscopy;  Laterality: N/A;   ORIF TIBIA PLATEAU Right 06/18/2023   Procedure: OPEN REDUCTION INTERNAL FIXATION (ORIF) TIBIAL PLATEAU;  Surgeon: Oliver Barre, MD;  Location: [facility];  Service: Orthopedics;  Laterality: Right;   Teeth removal         Inpatient Medications: Scheduled Meds:   bisoprolol  5 mg Oral Daily   budesonide (PULMICORT) nebulizer solution  1 mg Nebulization BID   Chlorhexidine Gluconate Cloth  6 each Topical Daily   DermaCerin   Topical BID   dextromethorphan-guaiFENesin  1 tablet Oral BID   diltiazem  120 mg Oral Daily   doxycycline  100 mg Oral Q12H   ipratropium  0.5 mg Nebulization Q6H   levalbuterol  0.63 mg Nebulization Q6H   levothyroxine  50 mcg Oral QAC breakfast   methylPREDNISolone (SOLU-MEDROL) injection  80 mg Intravenous Daily   mouth rinse  15 mL Mouth Rinse 4 times per day   pantoprazole  40 mg Oral Daily   rOPINIRole  3 mg Oral BID   rosuvastatin  10 mg Oral Daily   sodium chloride flush  3 mL Intravenous Q12H   Warfarin - Pharmacist Dosing Inpatient   Does not apply q1600   Continuous Infusions:  PRN Meds: acetaminophen **OR** acetaminophen, LORazepam, ondansetron **OR** ondansetron (ZOFRAN) IV, mouth rinse, oxyCODONE, sodium chloride, sodium chloride flush  Allergies:    Allergies  Allergen Reactions   Penicillin G Itching and Other (See Comments)   Penicillins Hives, Itching, Swelling and Rash    Social History:   Social History   Socioeconomic History   Marital status: Married    Spouse name: Not on file   Number of children: 1   Years of education: stopped in 10th grade   Highest education level: GED or equivalent  Occupational History   Occupation: Retired  Tobacco Use   Smoking status: Former    Current packs/day: 0.00    Average packs/day: 1 pack/day for 20.0 years (20.0 ttl pk-yrs)    Types: Cigarettes    Start date: 10/20/1968    Quit date: 10/20/1988    Years since quitting: 34.9   Smokeless tobacco: Never  Vaping Use   Vaping status: Never Used  Substance and Sexual Activity   Alcohol use: No    Alcohol/week: 0.0 standard drinks of alcohol   Drug use: No   Sexual activity: Yes    Partners: Male    Birth control/protection: None    Comment: spouse  Other Topics Concern   Not on file  Social  History Narrative   Not on file   Social Determinants of Health   Financial Resource Strain: Low Risk  (07/28/2018)   Overall Financial Resource Strain (CARDIA)    Difficulty of Paying Living Expenses: Not hard at all  Food Insecurity: No Food Insecurity (09/09/2023)   Hunger Vital Sign    Worried About Running Out of Food in the Last Year: Never true    Ran Out of Food in the Last Year: Never true  Transportation Needs: No Transportation Needs (09/09/2023)   PRAPARE - Administrator, Civil Service (Medical): No  Lack of Transportation (Non-Medical): No  Physical Activity: Inactive (07/28/2018)   Exercise Vital Sign    Days of Exercise per Week: 0 days    Minutes of Exercise per Session: 0 min  Stress: No Stress Concern Present (07/28/2018)   [employer] of Occupational Health - Occupational Stress Questionnaire    Feeling of Stress : Not at all  Social Connections: Somewhat Isolated (07/28/2018)   Social Connection and Isolation Panel [NHANES]    Frequency of Communication with Friends and Family: Never    Frequency of Social Gatherings with Friends and Family: Never    Attends Religious Services: More than 4 times per year    Active Member of [employer] or Organizations: No    Attends Banker Meetings: Never    Marital Status: Married  Catering manager Violence: Not At Risk (09/09/2023)   Humiliation, Afraid, Rape, and Kick questionnaire    Fear of Current or Ex-Partner: No    Emotionally Abused: No    Physically Abused: No    Sexually Abused: No    Family History:    Family History  Problem Relation Age of Onset   Stroke Mother    Cancer Father    Anesthesia problems Neg Hx    Hypotension Neg Hx    Malignant hyperthermia Neg Hx    Pseudochol deficiency Neg Hx      ROS:  Please see the history of present illness.   All other ROS reviewed and negative.     Physical Exam/Data:   Vitals:   09/14/23 0500 09/14/23 0600 09/14/23 0808  09/14/23 0821  BP: 100/69 107/64  126/70  Pulse: 91 79    Resp: (!) 28 19    Temp:   98.5 F (36.9 C)   TempSrc:   Axillary   SpO2: 95% 99%    Weight:      Height:        Intake/Output Summary (Last 24 hours) at 09/14/2023 0932 Last data filed at 09/14/2023 1610 Gross per 24 hour  Intake 240 ml  Output 350 ml  Net -110 ml      09/14/2023    1:02 AM 09/13/2023    5:00 AM 09/12/2023    5:00 AM  Last 3 Weights  Weight (lbs) 156 lb 12 oz 151 lb 0.2 oz 155 lb 10.3 oz  Weight (kg) 71.1 kg 68.5 kg 70.6 kg     Body mass index is 23.83 kg/m.  General:  Well nourished, well developed, in no acute distress HEENT: normal Neck: no JVD Vascular: No carotid bruits; Distal pulses 2+ bilaterally Cardiac:  RRR, mechanical S2 Lungs: coarse bilaterally Abd: soft, nontender, no hepatomegaly  Ext: no edema Musculoskeletal:  No deformities, BUE and BLE strength normal and equal Skin: warm and dry  Neuro:  CNs 2-12 intact, no focal abnormalities noted Psych:  Normal affect      Laboratory Data:  High Sensitivity Troponin:   Recent Labs  Lab 09/09/23 0942 09/09/23 1210  TROPONINIHS 19* 20*     Chemistry Recent Labs  Lab 09/09/23 1215 09/10/23 0442 09/12/23 0419 09/13/23 0405 09/14/23 0543  NA  --    < > 133* 134* 133*  K  --    < > 4.3 4.8 4.5  CL  --    < > 86* 88* 87*  CO2  --    < > 34* 33* 31  GLUCOSE  --    < > 89 76 98  BUN  --    < >  35* 45* 58*  CREATININE  --    < > 2.21* 2.42* 2.42*  CALCIUM  --    < > 8.7* 8.9 8.9  MG 2.9*  --   --   --   --   GFRNONAA  --    < > 23* 21* 21*  ANIONGAP  --    < > 13 13 15    < > = values in this interval not displayed.    No results for input(s): "PROT", "ALBUMIN", "AST", "ALT", "ALKPHOS", "BILITOT" in the last 168 hours. Lipids No results for input(s): "CHOL", "TRIG", "HDL", "LABVLDL", "LDLCALC", "CHOLHDL" in the last 168 hours.  Hematology Recent Labs  Lab 09/09/23 0942 09/10/23 0442 09/14/23 0543  WBC 6.1 7.3  3.7*  RBC 3.70* 3.35* 3.59*  HGB 10.4* 9.1* 9.7*  HCT 33.7* 30.7* 33.2*  MCV 91.1 91.6 92.5  MCH 28.1 27.2 27.0  MCHC 30.9 29.6* 29.2*  RDW 16.0* 16.2* 15.9*  PLT 191 203 204   Thyroid  Recent Labs  Lab 09/09/23 1215  TSH 2.652    BNP Recent Labs  Lab 09/09/23 0942  BNP 849.0*    DDimer No results for input(s): "DDIMER" in the last 168 hours.   Radiology/Studies:  ECHOCARDIOGRAM COMPLETE  Result Date: 09/10/2023    ECHOCARDIOGRAM REPORT   Patient Name:   [name] Date of Exam: 09/10/2023 Medical Rec #:  [id number]       Height:       68.0 in Accession #:    [id number]      Weight:       150.6 lb Date of Birth:  Jul 05, 1951        BSA:          1.811 m Patient Age:    72 years        BP:           100/59 mmHg Patient Gender: F               HR:           77 bpm. [facility] Procedure: 2D Echo, Cardiac Doppler, Color Doppler and Intracardiac            Opacification Agent Indications:    CHF-Acute Diastolic I50.31  History:        Patient has prior history of Echocardiogram examinations, most                 recent 10/31/2021. CHF, Arrythmias:Atrial Fibrillation; Risk                 Factors:Dyslipidemia, Former Smoker and Hypertension. Chronic                 anticoagulation, Status post St. Jude mechanical AVR 2007.                  Aortic Valve: 25 mm St. Jude mechanical valve is present in the                 aortic position.  Sonographer:    Celesta Gentile RCS Referring Phys: [phone] CARLOS MADERA  Sonographer Comments: Patient on BiPAP during echo. IMPRESSIONS  1. Left ventricular ejection fraction, by estimation, is 65 to 70%. The left ventricle has normal function. The left ventricle has no regional wall motion abnormalities. There is mild left ventricular hypertrophy. Left ventricular diastolic parameters are indeterminate. There is the interventricular septum is flattened in systole and diastole, consistent with right ventricular  pressure and volume overload.  2.  Right ventricular systolic function is mildly reduced. The right ventricular size is mildly enlarged. There is mildly elevated pulmonary artery systolic pressure. The estimated right ventricular systolic pressure is 43.5 mmHg.  3. Left atrial size was severely dilated.  4. Large, rounded echodensity (approximately 3.5 x 3.5 cm) noted adjacent to atrial septum within region of left atrium - best seen in subcostal view and to less degree in apical two chamber view. Similar finding noted on prior study although somewhat obscured by acoustic shadowing from AVR. Concern would be to exclude atrial myxoma although could be off axis imaging of atrial wall. Mass not described on standard chest CT. Consider TEE or cardiac CTA for further evaluation if clinically stable.  5. Right atrial size was severely dilated.  6. The mitral valve is degenerative. Moderate mitral valve regurgitation. Moderate mitral annular calcification.  7. Tricuspid valve regurgitation is moderate.  8. The aortic valve has been repaired/replaced. Aortic valve regurgitation is not visualized. There is a 25 mm St. Jude mechanical valve present in the aortic position. Aortic valve mean gradient measures 5.0 mmHg.  9. The inferior vena cava is dilated in size with <50% respiratory variability, suggesting right atrial pressure of 15 mmHg. Comparison(s): Prior images reviewed side by side. FINDINGS  Left Ventricle: Left ventricular ejection fraction, by estimation, is 65 to 70%. The left ventricle has normal function. The left ventricle has no regional wall motion abnormalities. Definity contrast agent was given IV to delineate the left ventricular  endocardial borders. The left ventricular internal cavity size was normal in size. There is mild left ventricular hypertrophy. The interventricular septum is flattened in systole and diastole, consistent with right ventricular pressure and volume overload. Left ventricular diastolic function could not be evaluated  due to atrial fibrillation. Left ventricular diastolic parameters are indeterminate. Right Ventricle: The right ventricular size is mildly enlarged. No increase in right ventricular wall thickness. Right ventricular systolic function is mildly reduced. There is mildly elevated pulmonary artery systolic pressure. The tricuspid regurgitant  velocity is 2.67 m/s, and with an assumed right atrial pressure of 15 mmHg, the estimated right ventricular systolic pressure is 43.5 mmHg. Left Atrium: Left atrial size was severely dilated. Right Atrium: Right atrial size was severely dilated. Pericardium: There is no evidence of pericardial effusion. Mitral Valve: The mitral valve is degenerative in appearance. There is mild thickening of the mitral valve leaflet(s). There is mild calcification of the mitral valve leaflet(s). Moderate mitral annular calcification. Moderate mitral valve regurgitation,  with posteriorly-directed jet. MV peak gradient, 12.5 mmHg. The mean mitral valve gradient is 3.0 mmHg. Tricuspid Valve: The tricuspid valve is grossly normal. Tricuspid valve regurgitation is moderate. Aortic Valve: The aortic valve has been repaired/replaced. Aortic valve regurgitation is not visualized. Aortic valve mean gradient measures 5.0 mmHg. Aortic valve peak gradient measures 10.9 mmHg. Aortic valve area, by VTI measures 1.39 cm. There is a 25 mm St. Jude mechanical valve present in the aortic position. Pulmonic Valve: The pulmonic valve was grossly normal. Pulmonic valve regurgitation is mild. Aorta: The aortic root is normal in size and structure. Venous: The inferior vena cava is dilated in size with less than 50% respiratory variability, suggesting right atrial pressure of 15 mmHg. IAS/Shunts: No atrial level shunt detected by color flow Doppler.  LEFT VENTRICLE PLAX 2D LVIDd:         4.70 cm LVIDs:         2.90 cm LV PW:  1.50 cm LV IVS:        1.10 cm LVOT diam:     1.70 cm LV SV:         34 LV SV Index:    19 LVOT Area:     2.27 cm  RIGHT VENTRICLE RV S prime:     9.36 cm/s TAPSE (M-mode): 1.7 cm LEFT ATRIUM              Index         RIGHT ATRIUM           Index LA diam:        6.80 cm  3.75 cm/m    RA Area:     34.70 cm LA Vol (A2C):   254.0 ml 140.22 ml/m  RA Volume:   135.00 ml 74.52 ml/m LA Vol (A4C):   196.0 ml 108.20 ml/m LA Biplane Vol: 223.0 ml 123.10 ml/m  AORTIC VALVE AV Area (Vmax):    1.22 cm AV Area (Vmean):   1.06 cm AV Area (VTI):     1.39 cm AV Vmax:           165.33 cm/s AV Vmean:          99.733 cm/s AV VTI:            0.243 m AV Peak Grad:      10.9 mmHg AV Mean Grad:      5.0 mmHg LVOT Vmax:         88.70 cm/s LVOT Vmean:        46.700 cm/s LVOT VTI:          0.149 m LVOT/AV VTI ratio: 0.61  AORTA Ao Root diam: 2.90 cm MITRAL VALVE                TRICUSPID VALVE MV Area (PHT): 4.80 cm     TR Peak grad:   28.5 mmHg MV Area VTI:   0.89 cm     TR Vmax:        267.00 cm/s MV Peak grad:  12.5 mmHg MV Mean grad:  3.0 mmHg     SHUNTS MV Vmax:       1.77 m/s     Systemic VTI:  0.15 m MV Vmean:      60.9 cm/s    Systemic Diam: 1.70 cm MV Decel Time: 158 msec MV E velocity: 136.00 cm/s Nona Dell MD Electronically signed by Nona Dell MD Signature Date/Time: 09/10/2023/4:57:35 PM    Final      Assessment and Plan:   Acute on chronic HFpEF/Mild RV dysfunction with RV pressure/volume overload - 10/2022 echo: LVEF 65-70%, no WMAs, indet diastolic function, RV pressure and volume overload with D shaped septum, mild RV dysfunction, PASP 44, severe LAE, questionable LA mass - CXR likely pulmonary edema, +bilateral pleural effusions.BNP 849. Baseline weight from last admit looks to be aournd 160 lbs, weight on admit 154 lbs  -had been on IV lasix 40mg  bid, last dose 11/22 AM Cr 1.17 on admission, up to 2.42 and thus diuretics stopped. I/Os incomplete. Reds vest 33% suggesting euvolemic.  - history chronic respiratory failure on home O2, HFrEF massively dilated LA with LAVI 123  suggest long standing severely elevated left sided pressures. 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 RV function with TAPSE 1.7, s' 0.9. Overall mildly decreased.  - in general does not appear markedly volume overloaded, hold on diuretics with AKI. May resume gentle diuresis  when renal function allows. I think primary breathing issue is pulmonary in origin.    2.History of mechanical AVR -08/2023 echo: normal functoning AVR - coumadin per pharmacy. Admit INR 1.5, today 4.8   3. Possible LA mass - unclear etiology, not stable for additional imaging at this time.   4. AKI - followed by renal  5. COPD exacerbation/chronic home O2 - hypercapneic and hypoxia on ABG - nebs, steroids, abx per primary team  6. Permanent afib - she is on bispoprolol, diltiazem. Coumadin for anticoag in setting of afib with mechanical AVR    For questions or updates, please contact [facility] Please consult [email or web address] for contact info under    Signed, Dina Rich, MD  09/14/2023 9:32 AM

## 2023-09-14 NOTE — Telephone Encounter (Signed)
Patient Product/process development scientist completed.    The patient is insured through [facility]. Patient has Medicare and is not eligible for a copay card, but may be able to apply for patient assistance, if available.    Ran test claim for Trelegy Ellipta 200-62.5-25 mcg and the current 30 day co-pay is $0.00.  Ran test claim for [employer] and Product Not Covered  This test claim was processed through [employer]- copay amounts may vary at other pharmacies due to [employer], or as the patient moves through the different stages of their insurance plan.     Roland Earl, CPHT Pharmacy Technician III Certified Patient Advocate [facility] Patient Advocate Team Direct Number: [phone]  Fax: [phone]

## 2023-09-14 NOTE — Progress Notes (Signed)
Trying patient off Bipap to see if she can sustain a satisfactory sat to allow her to take meds and possibly eat and drink.  Patient Is currently 99% on 15L [place] at this time.  Also placed bandage on patient's nose to protect against breakdown.

## 2023-09-14 NOTE — Progress Notes (Signed)
PHARMACY - ANTICOAGULATION CONSULT NOTE  Pharmacy Consult for Coumadin Indication:  aortic mechanical valve    Allergies  Allergen Reactions   Penicillin G Itching and Other (See Comments)   Penicillins Hives, Itching, Swelling and Rash    Patient Measurements: Height: 5\' 8"  (172.7 cm) Weight: 71.1 kg (156 lb 12 oz) IBW/kg (Calculated) : 63.9   Vital Signs: Temp: 98.5 F (36.9 C) (11/25 0808) Temp Source: Axillary (11/25 0808) BP: 126/70 (11/25 0821) Pulse Rate: 79 (11/25 0600)  Labs: Recent Labs    09/12/23 0419 09/13/23 0405 09/14/23 0543  HGB  --   --  9.7*  HCT  --   --  33.2*  PLT  --   --  204  LABPROT 26.6* 33.7* 45.3*  INR 2.4* 3.3* 4.8*  CREATININE 2.21* 2.42* 2.42*    Estimated Creatinine Clearance: 21.5 mL/min (A) (by C-G formula based on SCr of 2.42 mg/dL (H)).   Medical History: Past Medical History:  Diagnosis Date   Anxiety disorder    Aortic stenosis    Status post St. Jude mechanical AVR 2007   Asthma    Atrial fibrillation (HCC)    Carcinoid tumor of colon 2007   Chronic diastolic heart failure (HCC)    Coronary atherosclerosis of native coronary artery    Status post CABG 2007   GERD (gastroesophageal reflux disease)    History of colonoscopy 2003   Dr. 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 Jena Gauss, benign biopsies   RLS (restless legs syndrome)    Schatzki's ring    Assessment: Patient presented with SOB. Pharmacy consulted to dose warfarin in patient with atrial fibrillation and mechanical AVR. Patient's warfarin home dose listed as 4 mg on Tuesday and 3 mg ROW.   INR appears very labile and also patient's diet appears to be poor increasing warfarin sensitivity. INR continues to rise to 4.8. No bleeding issues noted. Antibiotics and steroids as well as poor diet could be leading to increased warfarin sensitivity.   Goal of Therapy:  INR  2-3 Monitor platelets by anticoagulation protocol: Yes   Plan:  No warfarin tonight Daily PT-INR, check cbc in am Monitor for S/S of bleeding  Sheppard Coil PharmD., BCPS Clinical Pharmacist 09/14/2023 9:38 AM

## 2023-09-14 NOTE — Progress Notes (Signed)
Patient placed back on Bipap due to increased WOB and desat.  RN giving patient meds to decrease anxiety while on Bipap.  RN at bedside.

## 2023-09-14 NOTE — Progress Notes (Signed)
Progress Note   Patient: [name] JXB:[id number] DOB: 1951/08/22 DOA: 09/09/2023     5 DOS: the patient was seen and examined on 09/14/2023   Brief hospital course: [name] is a 72 y.o. female with medical history significant of chronic diastolic heart failure, atrial fibrillation, history of aortic valve replacement (mechanical valve) chronically on Coumadin, history of COPD, chronic respiratory failure (on 2 L at baseline), gastroesophageal reflux disease, hypothyroidism, Paget's disease and restless leg syndrome; who presented to the hospital secondary to worsening shortness of breath.  Symptoms has been present for the last 3-4 days and worsening.  Family noticed decrease in care station capacity due to shortness of breath and also increased requirement for oxygen supplementation.  At home bronchodilator management has failed to improve symptoms.   Patient reports dry coughing spells, increased wheezing and decreased appetite.  No fever, no chest pain, no abdominal pain, no dysuria, no hematuria, no sick contacts, no focal weaknesses or any other complaints.   Per family at bedside patient actively seeing for positive left nipple changes and breast discharge.   Nebulizer management, IV antibiotics and IV Lasix initiated at presentation.  COVID PCR negative.   CT chest: Demonstrating moderate cardiomegaly, enlarged pulmonary arteries suggesting pulmonary hypertension and the presence of mild bilateral pleural effusion along with atelectasis/bronchitic changes on the adjacent lower lobes.  Mild bilateral pulmonary edema appreciated.  Assessment and Plan: 1-acute on chronic respiratory failure with hypoxia -Appears to be multifactorial in the setting of COPD exacerbation and CHF exacerbation -Chronically on 2 L nasal cannula supplementation at baseline. -slight improvement appreciated in her air movement; but still requiring BIPAP intermittently.  -Continue treatment with  steroids, nebulizer management, and wean off oxygen supplementation as tolerated. -diuretics has been place on hold due to worsening renal function. -Still requiring BiPAP intermittently.  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 -Appears assistance and recommendation by nephrology service -Holding nephrotoxic agents, avoiding the use of NSAIDs and will follow renal function.  3-essential hypertension Continue to follow vital sign -Continue current antihypertensive agents.  4-DC -Continue Synthroid. -TSH within normal limits.  5-gastroesophageal flux disease -Continue PPI.  6-COPD exacerbation -Continue treatment with steroids and nebulizer management as mentioned above.  7-acute on chronic diastolic heart failure -Continue treatment as mentioned above with IV diuresis -Continue low-sodium diet and follow daily weights/strict I's and O's. -2D echo with ejection fraction 65 to 70%; reports demonstrating interventricular septum is flattened in systole and diastole consistent with right ventricular pressure low. -Will continue holding diuresis in the setting of acute on chronic renal failure; follow cardiology recommendations.   8-history of mechanical heart valve replacement -Continue Coumadin per pharmacy. -Holding Coumadin today based on elevated INR.  9-hyperlipidemia -Continue Crestor.  10-history of chronic atrial fibrillation -Continue Cardizem and bisoprolol -Coumadin per pharmacy for secondary prevention -Rate controlled A-fib appreciated on telemetry.  Subjective:  Chronically ill and deconditioned; BiPAP in place on today's examination.  No fever, continue to experience intermittent episodes of respiratory distress and hypoxia.  On and off BiPAP.  Physical Exam: Vitals:    09/14/23 1530 09/14/23 1600 09/14/23 1630 09/14/23 1700  BP:    (!) 97/52  Pulse: 67 64 78 68  Resp: 17 20  18   Temp:      TempSrc:      SpO2: 97% 97% 94% 96%  Weight:  Height:       General exam: BiPAP in place; continue experiencing difficulty with shortness of breath and intermittent hypoxic state. Respiratory system: Decreased breath sounds at the bases; no using accessory muscles on today's examination. Cardiovascular system: Rate controlled, metallic valve click appreciated; no rubs or gallops. Gastrointestinal system: Abdomen is nondistended, soft and nontender.  Positive bowel sounds appreciated. Central nervous system: Generally weak.  No focal neurological deficits. Extremities: No cyanosis or clubbing; chronic stasis dermatitis appreciated (left lower extremity with leg wraps/dressing in place).  1+ edema. Skin: No petechiae. Psychiatry: Flat affect appreciated.  Data Reviewed: CBC: White blood cells 3.7, hemoglobin 9.7 and platelet count 204K Basic metabolic panel: Sodium 133, potassium 4.5, chloride 87, bicarb 31, BUN 58, creatinine 2.42 and GFR 21 Pro time-INR: 45.3/4.8 respectively Urine sodium: Less than 10 Urinalysis with 30 protein, negative nitrite, negative leukocyte esterase, specific gravity 1.013 and yellow color.  No signs of infection.   Family Communication: No family at bedside.  Disposition: Status is: Inpatient Remains inpatient appropriate because: Continue steroids and IV diuresis.  Planned Discharge Destination: To be determined.  Time spent: 55 minutes  Author: Vassie Loll, MD 09/14/2023 7:04 PM  For on call review [email or web address].

## 2023-09-14 NOTE — Consult Note (Signed)
Reason for Consult: AKI/CKD stage IIIa Referring Physician: Gwenlyn Perking, MD  [name] is an 72 y.o. female with a PMH significant for HTN, COPD (on 2 liters [place] at baseline), chronic diastolic CHF, atrial fibrillation, h/o mechanical AVR on courmadin, GERD, paget's disease, and CKD Stage IIIa who presented to [facility] ED via EMS on 09/09/23 with 3-4 day history of worsening SOB.  In the ED, temp 98, Bp 110/71, HR 93, SpO2 78%, RR 35.  Labs notable for Na 132, Cl 87, Cr 1.17, Hgb 10.4, BNP 849.  CXR consistent with CHF and pulmonary edema.  CT of  chest with IV contrast revealed bilateral pulmonary edema and pleural effusions.  She was admitted for acute on chronic respiratory failure with hypoxia and CHF.  We were consulted after she developed AKI/CKD stage IIIa.  The trend in Scr is seen below.   IV furosemide last given 09/11/23.  She had been on Farxiga prior to admission but no ACE/ARB.   She denies any N/V/D, dysgeusia, dysuria, pyuria, hematuria, urgency, frequency, or retention.  Trend in Creatinine: Creatinine, Ser  Date/Time Value Ref Range Status  09/14/2023 05:43 AM 2.42 (H) 0.44 - 1.00 mg/dL Final  16/07/9603 54:09 AM 2.42 (H) 0.44 - 1.00 mg/dL Final  81/19/1478 29:56 AM 2.21 (H) 0.44 - 1.00 mg/dL Final  21/30/8657 84:69 AM 1.70 (H) 0.44 - 1.00 mg/dL Final  62/95/2841 32:44 AM 1.19 (H) 0.44 - 1.00 mg/dL Final  10/22/7251 66:44 AM 1.17 (H) 0.44 - 1.00 mg/dL Final  03/47/4259 56:38 AM 0.94 0.44 - 1.00 mg/dL Final  75/64/3329 51:88 AM 0.99 0.44 - 1.00 mg/dL Final  41/66/0630 16:01 AM 1.16 (H) 0.44 - 1.00 mg/dL Final  09/32/3557 32:20 AM 1.06 (H) 0.44 - 1.00 mg/dL Final  25/42/7062 37:62 AM 1.11 (H) 0.44 - 1.00 mg/dL Final  83/15/1761 60:73 AM 1.54 (H) 0.44 - 1.00 mg/dL Final  71/03/2693 85:46 PM 1.46 (H) 0.44 - 1.00 mg/dL Final  27/12/5007 38:18 AM 1.84 (H) 0.44 - 1.00 mg/dL Final  29/93/7169 67:89 AM 1.47 (H) 0.44 - 1.00 mg/dL Final  38/07/1750 02:58 PM 1.49 (H) 0.44 - 1.00 mg/dL Final   52/77/8242 35:36 AM 1.60 (H) 0.44 - 1.00 mg/dL Final  14/43/1540 08:67 AM 1.52 (H) 0.44 - 1.00 mg/dL Final  61/95/0932 67:12 AM 1.72 (H) 0.44 - 1.00 mg/dL Final  45/80/9983 38:25 AM 1.79 (H) 0.44 - 1.00 mg/dL Final  05/39/7673 41:93 AM 1.81 (H) 0.44 - 1.00 mg/dL Final  79/11/4095 35:32 AM 2.05 (H) 0.44 - 1.00 mg/dL Final  99/24/2683 41:96 PM 2.19 (H) 0.44 - 1.00 mg/dL Final  22/29/7989 21:19 AM 1.67 (H) 0.44 - 1.00 mg/dL Final  41/74/0814 48:18 AM 1.11 (H) 0.44 - 1.00 mg/dL Final  56/31/4970 26:37 AM 1.00 0.44 - 1.00 mg/dL Final  85/88/5027 74:12 AM 1.03 (H) 0.44 - 1.00 mg/dL Final  87/86/7672 09:47 PM 1.11 (H) 0.44 - 1.00 mg/dL Final  09/62/8366 29:47 PM 1.03 (H) 0.44 - 1.00 mg/dL Final  65/46/5035 46:56 AM 1.43 (H) 0.44 - 1.00 mg/dL Final  81/27/5170 01:74 AM 1.42 (H) 0.44 - 1.00 mg/dL Final  94/49/6759 16:38 AM 1.35 (H) 0.44 - 1.00 mg/dL Final  46/65/9935 70:17 AM 1.41 (H) 0.44 - 1.00 mg/dL Final  79/39/0300 92:33 AM 1.54 (H) 0.44 - 1.00 mg/dL Final  00/76/2263 33:54 AM 1.68 (H) 0.44 - 1.00 mg/dL Final  56/25/6389 37:34 AM 1.75 (H) 0.44 - 1.00 mg/dL Final  28/76/8115 72:62 PM 1.83 (H) 0.44 - 1.00 mg/dL Final  03/55/9741  06:22 AM 1.57 (H) 0.44 - 1.00 mg/dL Final  24/40/1027 25:36 AM 1.77 (H) 0.44 - 1.00 mg/dL Final  64/40/3474 25:95 AM 1.48 (H) 0.44 - 1.00 mg/dL Final  63/87/5643 32:95 PM 1.57 (H) 0.44 - 1.00 mg/dL Final  18/84/1660 63:01 AM 1.10 (H) 0.44 - 1.00 mg/dL Final  60/07/9322 55:73 AM 1.14 (H) 0.44 - 1.00 mg/dL Final  22/11/5425 06:23 AM 1.29 (H) 0.44 - 1.00 mg/dL Final  76/28/3151 76:16 AM 1.75 (H) 0.44 - 1.00 mg/dL Final  07/37/1062 69:48 PM 1.37 (H) 0.44 - 1.00 mg/dL Final  54/62/7035 00:93 AM 1.35 (H) 0.44 - 1.00 mg/dL Final  81/82/9937 16:96 AM 1.43 (H) 0.44 - 1.00 mg/dL Final  78/93/8101 75:10 AM 1.66 (H) 0.44 - 1.00 mg/dL Final  25/85/2778 24:23 AM 1.65 (H) 0.44 - 1.00 mg/dL Final  53/61/4431 54:00 AM 2.10 (H) 0.44 - 1.00 mg/dL Final  86/76/1950 93:26 AM 2.02  (H) 0.44 - 1.00 mg/dL Final    PMH:   Past Medical History:  Diagnosis Date   Anxiety disorder    Aortic stenosis    Status post St. Jude mechanical AVR 2007   Asthma    Atrial fibrillation (HCC)    Carcinoid tumor of colon 2007   Chronic diastolic heart failure (HCC)    Coronary atherosclerosis of native coronary artery    Status post CABG 2007   GERD (gastroesophageal reflux disease)    History of colonoscopy 2003   Dr. 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 Jena Gauss, benign biopsies   RLS (restless legs syndrome)    Schatzki's ring     PSH:   Past Surgical History:  Procedure Laterality Date   ABDOMINAL HYSTERECTOMY     AORTIC VALVE REPLACEMENT  2007   #25 mm St. Jude mechanical prosthesis with Hemashield tube graft repair of ascending aneurysm   APPENDECTOMY  2007   BACK SURGERY     lumbar 4 and 5    BIOPSY  02/05/2012   RMR:Two tongues of salmon-colored epithelium distal esophagus, very short-segment Barrett's s/p bx/Small hiatal hernia, otherwise normal stomach, D1, D2. Status post esophageal dilation. Biopsy showed GERD.   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 Jena Gauss- schatzki's ring, shoft peptic stricture at GE junction.   ESOPHAGOGASTRODUODENOSCOPY (EGD) WITH PROPOFOL N/A 02/08/2018   Procedure: ESOPHAGOGASTRODUODENOSCOPY (EGD) WITH PROPOFOL;  Surgeon: Corbin Ade, MD;  Location: [facility];  Service: Endoscopy;  Laterality: N/A;  8:15am   FOOT SURGERY     bilateral bunionectomy   HERNIA REPAIR     with mesh   LAPAROTOMY  2007   small bowel  resection secondary to small bowel obstruction   MALONEY DILATION  02/05/2012   Procedure: Elease Hashimoto DILATION;  Surgeon: Corbin Ade, MD;  Location: [facility];  Service: Endoscopy;  Laterality: N/A;  56mm    MALONEY DILATION N/A 02/08/2018   Procedure: Elease Hashimoto DILATION;  Surgeon: Corbin Ade, MD;  Location: [facility];  Service: Endoscopy;  Laterality: N/A;  ORIF TIBIA PLATEAU Right 06/18/2023   Procedure: OPEN REDUCTION INTERNAL FIXATION (ORIF) TIBIAL PLATEAU;  Surgeon: Oliver Barre, MD;  Location: [facility];  Service: Orthopedics;  Laterality: Right;   Teeth removal      Allergies:  Allergies  Allergen Reactions   Penicillin G Itching and Other (See Comments)   Penicillins Hives, Itching, Swelling and Rash    Medications:   Prior to Admission medications   Medication Sig Start Date End Date Taking? Authorizing Provider  ALPRAZolam Prudy Feeler) 0.5 MG tablet Take 1 tablet (0.5 mg total) by mouth 2 (two) times daily. 06/24/23  Yes Tat, [name], MD  bisoprolol (ZEBETA) 5 MG tablet Take 5 mg by mouth daily.   Yes [provider]  CAPLYTA 42 MG capsule Take 42 mg by mouth daily. 02/17/22  Yes [provider]  diltiazem (CARDIZEM CD) 120 MG 24 hr capsule Take 1 capsule (120 mg total) by mouth daily. 04/01/18  Yes Vassie Loll, MD  FARXIGA 10 MG TABS tablet Take 10 mg by mouth daily. 01/21/21  Yes [provider]  ipratropium (ATROVENT) 0.06 % nasal spray Place 2 sprays into both nostrils in the morning and at bedtime.   Yes [provider]  levothyroxine (SYNTHROID, LEVOTHROID) 50 MCG tablet Take 50 mcg by mouth daily before breakfast.    Yes [provider]  Multiple Vitamin (MULTIVITAMIN WITH MINERALS) TABS tablet Take 1 tablet by mouth daily.   Yes [provider]  omeprazole (PRILOSEC) 20 MG capsule Take 20 mg by mouth daily.   Yes [provider]  oxyCODONE 10 MG TABS Take 1 tablet (10 mg total) by mouth every 4 (four) hours as needed  for severe pain. 06/24/23  Yes Tat, [name], MD  OXYGEN Inhale 4 L into the lungs daily.   Yes [provider]  oxymetazoline (AFRIN) 0.05 % nasal spray Place 1 spray into both nostrils 2 (two) times daily as needed for congestion.   Yes [provider]  rOPINIRole (REQUIP) 3 MG tablet Take 3 mg by mouth 2 (two) times daily.   Yes [provider]  rosuvastatin (CRESTOR) 20 MG tablet TAKE ONE (1) TABLET BY MOUTH EVERY DAY 02/06/23  Yes Jonelle Sidle, MD  torsemide (DEMADEX) 100 MG tablet Take 100 mg by mouth daily.   Yes [provider]  warfarin (COUMADIN) 2 MG tablet Take 1.5-2 tablets (3-4 mg total) by mouth See admin instructions. 4mg  on Tuesday and 3 mg daily the rest of the week. 09/06/18  Yes Vassie Loll, MD    Inpatient medications:  bisoprolol  5 mg Oral Daily   budesonide (PULMICORT) nebulizer solution  1 mg Nebulization BID   Chlorhexidine Gluconate Cloth  6 each Topical Daily   DermaCerin   Topical BID   dextromethorphan-guaiFENesin  1 tablet Oral BID   diltiazem  120 mg Oral Daily   doxycycline  100 mg Oral Q12H   ipratropium  0.5 mg Nebulization Q6H   levalbuterol  0.63 mg Nebulization Q6H   levothyroxine  50 mcg Oral QAC breakfast   methylPREDNISolone (SOLU-MEDROL) injection  80 mg Intravenous Daily   mouth rinse  15 mL Mouth Rinse 4 times per day   pantoprazole  40 mg Oral Daily   rOPINIRole  3 mg Oral BID   rosuvastatin  10 mg Oral Daily   sodium chloride flush  3 mL Intravenous Q12H   Warfarin - Pharmacist Dosing Inpatient   Does not apply q1600    Discontinued Meds:  Medications Discontinued During This Encounter  Medication Reason   albuterol (PROVENTIL) (2.5 MG/3ML) 0.083% nebulizer solution 5 mg    albuterol (PROVENTIL) (2.5 MG/3ML) 0.083% nebulizer solution Returned to ADS   levofloxacin (LEVAQUIN) IVPB 750 mg    albuterol (PROVENTIL) (2.5 MG/3ML) 0.083% nebulizer solution 5 mg    vancomycin (VANCOREADY) IVPB 1500  mg/300 mL    vancomycin (VANCOCIN) IVPB 1000 mg/200 mL premix    rosuvastatin (CRESTOR) 10 MG tablet Dose change   budesonide (PULMICORT) nebulizer solution 0.5 mg    furosemide (LASIX) injection 40 mg    ALPRAZolam (XANAX) tablet 0.25 mg    furosemide (LASIX) injection 40 mg    levalbuterol (XOPENEX) nebulizer solution 0.63 mg    ipratropium (ATROVENT) nebulizer solution 0.5 mg     Social History:  reports that she quit smoking about 34 years ago. Her smoking use included cigarettes. She started smoking about 54 years ago. She has a 20 pack-year smoking history. She has never used smokeless tobacco. She reports that she does not drink alcohol and does not use drugs.  Family History:   Family History  Problem Relation Age of Onset   Stroke Mother    Cancer Father    Anesthesia problems Neg Hx    Hypotension Neg Hx    Malignant hyperthermia Neg Hx    Pseudochol deficiency Neg Hx     Pertinent items are noted in HPI. Weight change: 2.6 kg  Intake/Output Summary (Last 24 hours) at 09/14/2023 0826 Last data filed at 09/14/2023 0554 Gross per 24 hour  Intake --  Output 350 ml  Net -350 ml   BP 107/64   Pulse 79   Temp 98.5 F (36.9 C) (Axillary)   Resp 19   Ht 5\' 8"  (1.727 m)   Wt 71.1 kg   SpO2 99%   BMI 23.83 kg/m  Vitals:   09/14/23 0432 09/14/23 0500 09/14/23 0600 09/14/23 0808  BP:  100/69 107/64   Pulse:  91 79   Resp:  (!) 28 19   Temp: 97.7 F (36.5 C)   98.5 F (36.9 C)  TempSrc: Axillary   Axillary  SpO2:  95% 99%   Weight:      Height:         General appearance: fatigued, no distress, pale, and slowed mentation Head: Normocephalic, without obvious abnormality, atraumatic Resp: clear to auscultation bilaterally and poor inspiratory effort Cardio: irregularly irregular rhythm, no rub, and metallic click present GI: soft, non-tender; bowel sounds normal; no masses,  no organomegaly Extremities: edema trace pretibial edema, wrappings to bilateral lower  extremities.  Labs: Basic Metabolic Panel: Recent Labs  Lab 09/09/23 0942 09/09/23 1215 09/10/23 0442 09/11/23 0502 09/12/23 0419 09/13/23 0405 09/14/23 0543  NA 132*  --  133* 133* 133* 134* 133*  K 3.5  --  3.8 4.4 4.3 4.8 4.5  CL 87*  --  88* 87* 86* 88* 87*  CO2 32  --  36* 36* 34* 33* 31  GLUCOSE 115*  --  135* 99 89 76 98  BUN 14  --  16 27* 35* 45* 58*  CREATININE 1.17*  --  1.19* 1.70* 2.21* 2.42* 2.42*  CALCIUM 9.1  --  8.6* 8.9 8.7* 8.9 8.9  PHOS  --  3.5  --   --   --   --   --    Liver Function Tests: No results for input(s): "AST", "ALT", "ALKPHOS", "BILITOT", "PROT", "ALBUMIN" in the last 168 hours. No results  for input(s): "LIPASE", "AMYLASE" in the last 168 hours. No results for input(s): "AMMONIA" in the last 168 hours. CBC: Recent Labs  Lab 09/09/23 0942 09/10/23 0442 09/14/23 0543  WBC 6.1 7.3 3.7*  NEUTROABS 5.3  --   --   HGB 10.4* 9.1* 9.7*  HCT 33.7* 30.7* 33.2*  MCV 91.1 91.6 92.5  PLT 191 203 204   PT/INR: @LABRCNTIP (inr:5) Cardiac Enzymes: )No results for input(s): "CKTOTAL", "CKMB", "CKMBINDEX", "TROPONINI" in the last 168 hours. CBG: Recent Labs  Lab 09/10/23 0736 09/10/23 2145  GLUCAP 115* 127*    Iron Studies: No results for input(s): "IRON", "TIBC", "TRANSFERRIN", "FERRITIN" in the last 168 hours.  Xrays/Other Studies: No results found.   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  Scr appears to have reached a plateau and hopefully will start to see an improvement.  Will order urine studies and renal US for completeness.  No indication for dialysis at this time.  Avoid nephrotoxic medications including NSAIDs and iodinated intravenous contrast exposure unless the latter is absolutely indicated.   Preferred narcotic agents for pain control are hydromorphone, fentanyl, and methadone. Morphine should not be used.  Avoid Baclofen and avoid oral  sodium phosphate and magnesium citrate based laxatives / bowel preps.  Continue strict Input and Output monitoring. Will monitor the patient closely with you and intervene or adjust therapy as indicated by changes in clinical status/labs  Acute on chronic  respiratory failure - hypoxia and hypercapnia.  Likely COPD exacerbation as well as CHF.  Currently on steroids, nebulizers, and abx per primary svc.  Bipap at bedtime. Acute on chronic diastolic CHF - initially diuresed but now on hold due to AKI. S/p mechanical AVR  - on coumadin HTN - stable DM - per primary Disposition - pt is DNR   Irena Cords 09/14/2023, 8:26 AM

## 2023-09-14 NOTE — Progress Notes (Signed)
Date and time results received: 09/14/23 09:31AM (use smartphrase ".now" to insert current time)  Test: INR Critical Value: 4.8  Name of Provider Notified: Dr. Gwenlyn Perking  Orders Received? Or Actions Taken?:  MD notified awaiting orders

## 2023-09-14 NOTE — Progress Notes (Signed)
SLP Cancellation Note  Patient Details Name: LITSA CURE MRN: [id number] DOB: [birth year]   Cancelled treatment:       Reason Eval/Treat Not Completed: Medical issues which prohibited therapy (Pt still on Bi-PAP at this time. SLP will check back as schedule permits.)  Thank you,  Havery Moros, CCC-SLP [phone]  Dakwan Pridgen 09/14/2023, 2:20 PM

## 2023-09-15 ENCOUNTER — Encounter (HOSPITAL_COMMUNITY): Payer: Self-pay | Admitting: Internal Medicine

## 2023-09-15 DIAGNOSIS — Z7189 Other specified counseling: Secondary | ICD-10-CM

## 2023-09-15 DIAGNOSIS — Z515 Encounter for palliative care: Secondary | ICD-10-CM | POA: Diagnosis not present

## 2023-09-15 DIAGNOSIS — E785 Hyperlipidemia, unspecified: Secondary | ICD-10-CM | POA: Diagnosis not present

## 2023-09-15 DIAGNOSIS — I5033 Acute on chronic diastolic (congestive) heart failure: Secondary | ICD-10-CM | POA: Diagnosis not present

## 2023-09-15 DIAGNOSIS — I482 Chronic atrial fibrillation, unspecified: Secondary | ICD-10-CM | POA: Diagnosis not present

## 2023-09-15 DIAGNOSIS — J9621 Acute and chronic respiratory failure with hypoxia: Secondary | ICD-10-CM | POA: Diagnosis not present

## 2023-09-15 LAB — PROTIME-INR
INR: 6.3 (ref 0.8–1.2)
Prothrombin Time: 55.5 s — ABNORMAL HIGH (ref 11.4–15.2)

## 2023-09-15 LAB — RENAL FUNCTION PANEL
Albumin: 3.5 g/dL (ref 3.5–5.0)
Anion gap: 12 (ref 5–15)
BUN: 68 mg/dL — ABNORMAL HIGH (ref 8–23)
CO2: 34 mmol/L — ABNORMAL HIGH (ref 22–32)
Calcium: 8.9 mg/dL (ref 8.9–10.3)
Chloride: 89 mmol/L — ABNORMAL LOW (ref 98–111)
Creatinine, Ser: 2.29 mg/dL — ABNORMAL HIGH (ref 0.44–1.00)
GFR, Estimated: 22 mL/min — ABNORMAL LOW (ref >60)
Glucose, Bld: 115 mg/dL — ABNORMAL HIGH (ref 70–99)
Phosphorus: 4.5 mg/dL (ref 2.5–4.6)
Potassium: 4.5 mmol/L (ref 3.5–5.1)
Sodium: 135 mmol/L (ref 135–145)

## 2023-09-15 MED ORDER — ACETAMINOPHEN 160 MG/5ML PO SOLN
650.0000 mg | Freq: Four times a day (QID) | ORAL | Status: DC | PRN
  Administered 2023-09-15 – 2023-09-17 (×2): 650 mg via ORAL
  Filled 2023-09-15 (×2): qty 20.3

## 2023-09-15 MED ORDER — VITAMIN K1 10 MG/ML IJ SOLN
5.0000 mg | Freq: Once | INTRAVENOUS | Status: AC
  Administered 2023-09-15: 5 mg via INTRAVENOUS
  Filled 2023-09-15 (×2): qty 0.5

## 2023-09-15 MED ORDER — GUAIFENESIN 100 MG/5ML PO LIQD
10.0000 mL | Freq: Four times a day (QID) | ORAL | Status: DC | PRN
  Administered 2023-09-21: 10 mL via ORAL
  Filled 2023-09-15: qty 10

## 2023-09-15 NOTE — Progress Notes (Signed)
SLP Cancellation Note  Patient Details Name: KIRYN KOKOSZKA MRN: [id number] DOB: 1951/10/14   Cancelled treatment:       Reason Eval/Treat Not Completed: Medical issues which prohibited therapy (Pt "fragile" per RN and back on Bi-PAP.)  Thank you,  Havery Moros, CCC-SLP [phone]  Evyn Putzier 09/15/2023, 2:21 PM

## 2023-09-15 NOTE — Plan of Care (Signed)
  Problem: Education: Goal: Knowledge of General Education information will improve Description: Including pain rating scale, medication(s)/side effects and non-pharmacologic comfort measures Outcome: Not Progressing   

## 2023-09-15 NOTE — Progress Notes (Signed)
Progress Note   Patient: [name]:[id number] DOB: 1951-07-01 DOA: 09/09/2023     6 DOS: the patient was seen and examined on 09/15/2023   Brief hospital course: [name] is a 72 y.o. female with medical history significant of chronic diastolic heart failure, atrial fibrillation, history of aortic valve replacement (mechanical valve) chronically on Coumadin, history of COPD, chronic respiratory failure (on 2 L at baseline), gastroesophageal reflux disease, hypothyroidism, Paget's disease and restless leg syndrome; who presented to the hospital secondary to worsening shortness of breath.  Symptoms has been present for the last 3-4 days and worsening.  Family noticed decrease in care station capacity due to shortness of breath and also increased requirement for oxygen supplementation.  At home bronchodilator management has failed to improve symptoms.   Patient reports dry coughing spells, increased wheezing and decreased appetite.  No fever, no chest pain, no abdominal pain, no dysuria, no hematuria, no sick contacts, no focal weaknesses or any other complaints.   Per family at bedside patient actively seeing for positive left nipple changes and breast discharge.   Nebulizer management, IV antibiotics and IV Lasix initiated at presentation.  COVID PCR negative.   CT chest: Demonstrating moderate cardiomegaly, enlarged pulmonary arteries suggesting pulmonary hypertension and the presence of mild bilateral pleural effusion along with atelectasis/bronchitic changes on the adjacent lower lobes.  Mild bilateral pulmonary edema appreciated.  Assessment and Plan: 1-acute on chronic respiratory failure with hypoxia -Appears to be multifactorial in the setting of COPD exacerbation and CHF exacerbation -Chronically on 2 L nasal cannula supplementation at baseline. -slight improvement appreciated in her air movement; but still requiring BIPAP intermittently.  -Continue treatment with  steroids, nebulizer management, and wean off oxygen supplementation as tolerated. -diuretics has been place on hold due to worsening renal function. -Still requiring BiPAP intermittently.  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 -Appreciate assistance and recommendation by nephrology service -Creatinine adequately trending down. -Continue holding nephrotoxic agents, avoiding the use of NSAIDs and will follow renal function.  3-essential hypertension Continue to follow vital sign -Continue current antihypertensive agents.  4-hypothyroidism -Continue Synthroid. -TSH within normal limits.  5-gastroesophageal flux disease -Continue PPI.  6-COPD exacerbation -Continue treatment with steroids and nebulizer management as mentioned above.  7-acute on chronic diastolic heart failure -Continue treatment as mentioned above with IV diuresis -Continue low-sodium diet and follow daily weights/strict I's and O's. -2D echo with ejection fraction 65 to 70%; reports demonstrating interventricular septum is flattened in systole and diastole consistent with right ventricular pressure low. -Will continue holding diuresis in the setting of acute on chronic renal failure; follow cardiology recommendations.   8-history of mechanical heart valve replacement -Continue Coumadin per pharmacy. -Holding Coumadin today based on elevated INR.  9-hyperlipidemia -Continue Crestor.  10-history of chronic atrial fibrillation -Continue Cardizem and bisoprolol -Coumadin per pharmacy for secondary prevention -Rate controlled A-fib appreciated on telemetry.  11-dysphagia -Nursing staff has appreciated some difficulty swallowing especially certain pills and hard consistency. -Speech therapy has been consulted.  Subjective:   Weak, deconditioned, chronically ill in appearance and demonstrating short winded sensation with minimal activity.  Continue requiring intermittent BiPAP.  Physical Exam: Vitals:   09/15/23 1606 09/15/23 1732 09/15/23 1745 09/15/23 1800  BP:  126/83  122/69  Pulse:  84 85 73  Resp:  (!) 38 (!) 37 (!) 23  Temp: Marland Kitchen)  97.1 F (36.2 C)     TempSrc: Axillary     SpO2:  99% 98% 93%  Weight:      Height:       General exam: Chronically ill in appearance; frail, afebrile, no chest pain, no nausea vomiting.  Continues to be short winded with minimal exertion and intermittently requiring BiPAP. Respiratory system: Fair air movement bilaterally; decreased breath sounds appreciated at the bases.  BiPAP in place at time of examination.  Intermittent tachypnea with minimal exertion. Cardiovascular system: Rate controlled, no rubs, no gallops, no JVD on exam.  Per telemetry atrial fibrillation appreciated intermittently. Gastrointestinal system: Abdomen is nondistended, soft and nontender. No organomegaly or masses felt. Normal bowel sounds heard. Central nervous system: Generally weak; no focal neurological deficits. Extremities: No cyanosis or clubbing; trace edema appreciated bilaterally.  Chronic stasis dermatitis appreciated. Skin: No petechiae. Psychiatry: Judgement and insight appear normal. Mood & affect appropriate.   Latest data Reviewed: CBC: White blood cells 3.7, hemoglobin 9.7 and platelet count 204K Renal function panel: Sodium 135, potassium 4.5, chloride 89, bicarb 34, glucose 115, BUN 68, creatinine 2.29, GFR 22. Pro time-INR: 55.5-6.3 respectively. Urine sodium: Less than 10 Urinalysis with 30 protein, negative nitrite, negative leukocyte esterase, specific gravity 1.013 and yellow color.  No signs of infection.   Family Communication: No family at bedside.  Disposition: Status is: Inpatient Remains inpatient appropriate because: Continue steroids and IV diuresis.  Planned  Discharge Destination: To be determined.  Time spent: 55 minutes  Author: Vassie Loll, MD 09/15/2023 6:30 PM  For on call review [email or web address].

## 2023-09-15 NOTE — Progress Notes (Signed)
PT Cancellation Note  Patient Details Name: [name] MRN: [id number] DOB: December 19, 1950   Cancelled Treatment:    Reason Eval/Treat Not Completed: Medical issues which prohibited therapy.  Physical therapy held due to patient having difficulty breathing and on BiPAP per RN.   2:03 PM, 09/15/23 Ocie Bob, MPT Physical Therapist with [facility] [phone] office [phone] mobile phone

## 2023-09-15 NOTE — [place] FL2 (Signed)
[employer] MEDICAID FL2 LEVEL OF CARE FORM     IDENTIFICATION  Patient Name: [name] Birthdate: Jan 31, 1951 Sex: female Admission Date (Current Location): 09/09/2023  [facility] and [place] Number:  [employer] and Address:  [facility],  [address] [place] [address]      Provider Number: [id number]  Attending Physician Name and Address:  Vassie Loll, MD  Relative Name and Phone Number:  Lawernce Keas)  [phone]    Current Level of Care: Hospital Recommended Level of Care: Skilled Nursing Facility Prior Approval Number:    Date Approved/Denied:   PASRR Number: [id number]  Discharge Plan: SNF    Current Diagnoses: Patient Active Problem List   Diagnosis Date Noted   Acute on chronic respiratory failure with hypoxia (HCC) 09/09/2023   Rib fractures 06/11/2023   Right tibial fracture 06/11/2023   Acute diastolic CHF (congestive heart failure) (HCC) 05/29/2019   Itching 05/29/2019   Acute hypokalemia 05/05/2019   Acute CHF (congestive heart failure) (HCC) 04/08/2019   Acute CHF (HCC) 04/07/2019   Asthma, chronic, unspecified asthma severity, with acute exacerbation 04/07/2019   Benign essential HTN    CHF (congestive heart failure) (HCC) 07/26/2018   Transaminitis 07/23/2018   Acute on chronic respiratory failure with hypoxia and hypercapnia (HCC) 06/19/2018   Acute respiratory failure with hypoxia (HCC)    Supratherapeutic INR 05/03/2018   CHF exacerbation (HCC) 05/02/2018   Acute metabolic encephalopathy 05/02/2018   CKD (chronic kidney disease) stage 3, GFR 30-59 ml/min (HCC) 05/02/2018   Hypothyroidism 05/02/2018   Generalized weakness 05/02/2018   Physical deconditioning    Coronary artery disease of bypass graft of native heart with stable angina pectoris (HCC)    Renal failure (ARF), acute on chronic (HCC)    Acute on chronic respiratory failure with hypercapnia (HCC) 03/24/2018   Hypomagnesemia 03/24/2018    Hypokalemia 03/24/2018   Hypophosphatemia 03/24/2018   Atrial fibrillation with RVR (HCC) 03/24/2018   Elevated troponin 03/24/2018   Coughing 01/14/2018   Anemia 01/14/2018   Hx of adenomatous colonic polyps 01/14/2018   Acute on chronic diastolic congestive heart failure (HCC) 12/17/2016   COPD with acute exacerbation (HCC) 12/17/2016   Bilateral leg edema 07/05/2014   Encounter for therapeutic drug monitoring 11/15/2013   Chronic venous insufficiency 08/26/2011   Chronic anticoagulation 01/11/2011   Peptic stricture of esophagus 10/04/2010   GERD 09/16/2010   DYSPHAGIA 09/16/2010   H/O heart valve replacement with mechanical valve 06/05/2009   Hyperlipidemia 06/04/2009   Coronary atherosclerosis of native coronary artery 06/04/2009   Atrial fibrillation, chronic (HCC) 06/04/2009    Orientation RESPIRATION BLADDER Height & Weight     Self  O2 Incontinent Weight: 151 lb 3.8 oz (68.6 kg) Height:  5\' 8"  (172.7 cm)  BEHAVIORAL SYMPTOMS/MOOD NEUROLOGICAL BOWEL NUTRITION STATUS      Incontinent Diet (Heart healthy)  AMBULATORY STATUS COMMUNICATION OF NEEDS Skin   Extensive Assist Verbally Normal (Venous stasis ulcer pretibial right)                       Personal Care Assistance Level of Assistance  Bathing, Feeding, Dressing Bathing Assistance: Limited assistance Feeding assistance: Independent Dressing Assistance: Limited assistance     Functional Limitations Info  Sight, Hearing, Speech Sight Info: Adequate Hearing Info: Impaired Speech Info: Adequate    SPECIAL CARE FACTORS FREQUENCY  PT (By licensed PT), OT (By licensed OT)     PT Frequency: 5 times weekly OT Frequency:  5 times weekly            Contractures Contractures Info: Not present    Additional Factors Info  Code Status, Allergies Code Status Info: DNR-Limited Allergies Info: Penicillins           Current Medications (09/15/2023):  This is the current hospital active medication  list Current Facility-Administered Medications  Medication Dose Route Frequency Provider Last Rate Last Admin   acetaminophen (TYLENOL) 160 MG/5ML solution 650 mg  650 mg Oral Q6H PRN Vassie Loll, MD   650 mg at 09/15/23 4782   bisoprolol (ZEBETA) tablet 5 mg  5 mg Oral Daily Vassie Loll, MD   5 mg at 09/15/23 1040   budesonide (PULMICORT) nebulizer solution 1 mg  1 mg Nebulization BID Vassie Loll, MD   1 mg at 09/15/23 9562   Chlorhexidine Gluconate Cloth 2 % PADS 6 each  6 each Topical Daily Vassie Loll, MD   6 each at 09/15/23 1321   DermaCerin CREA   Topical BID Vassie Loll, MD   Given at 09/14/23 2117   diltiazem (CARDIZEM CD) 24 hr capsule 120 mg  120 mg Oral Daily Vassie Loll, MD   120 mg at 09/15/23 1038   doxycycline (VIBRA-TABS) tablet 100 mg  100 mg Oral Q12H Vassie Loll, MD   100 mg at 09/15/23 0839   guaiFENesin (ROBITUSSIN) 100 MG/5ML liquid 10 mL  10 mL Oral Q6H PRN Vassie Loll, MD       ipratropium (ATROVENT) nebulizer solution 0.5 mg  0.5 mg Nebulization Q6H Emokpae, Courage, MD   0.5 mg at 09/15/23 1257   levalbuterol (XOPENEX) nebulizer solution 0.63 mg  0.63 mg Nebulization Q6H Zierle-Ghosh, Asia B, DO   0.63 mg at 09/15/23 1257   levothyroxine (SYNTHROID) tablet 50 mcg  50 mcg Oral QAC breakfast Vassie Loll, MD   50 mcg at 09/15/23 0505   LORazepam (ATIVAN) injection 0.5 mg  0.5 mg Intravenous Q8H PRN Vassie Loll, MD   0.5 mg at 09/14/23 2209   methylPREDNISolone sodium succinate (SOLU-MEDROL) 125 mg/2 mL injection 80 mg  80 mg Intravenous Daily Emokpae, Courage, MD   80 mg at 09/15/23 0919   ondansetron (ZOFRAN) tablet 4 mg  4 mg Oral Q6H PRN Vassie Loll, MD       Or   ondansetron [facility]) injection 4 mg  4 mg Intravenous Q6H PRN Vassie Loll, MD   4 mg at 09/12/23 1131   Oral care mouth rinse  15 mL Mouth Rinse 4 times per day Vassie Loll, MD   15 mL at 09/15/23 0740   Oral care mouth rinse  15 mL Mouth Rinse PRN Vassie Loll, MD        oxyCODONE (Oxy IR/ROXICODONE) immediate release tablet 10 mg  10 mg Oral Q6H PRN Vassie Loll, MD   10 mg at 09/09/23 2056   pantoprazole (PROTONIX) EC tablet 40 mg  40 mg Oral Daily Vassie Loll, MD   40 mg at 09/14/23 1308   rOPINIRole (REQUIP) tablet 3 mg  3 mg Oral BID Vassie Loll, MD   3 mg at 09/15/23 6578   rosuvastatin (CRESTOR) tablet 10 mg  10 mg Oral Daily Vassie Loll, MD   10 mg at 09/15/23 1040   sodium chloride (OCEAN) 0.65 % nasal spray 1 spray  1 spray Each Nare PRN Vassie Loll, MD   1 spray at 09/10/23 0033   sodium chloride flush (NS) 0.9 % injection 3 mL  3 mL Intravenous Q12H  Vassie Loll, MD   3 mL at 09/15/23 1104   sodium chloride flush (NS) 0.9 % injection 3 mL  3 mL Intravenous PRN Vassie Loll, MD       Warfarin - Pharmacist Dosing Inpatient   Does not apply [id number] Vassie Loll, MD   Given at 09/11/23 1735     Discharge Medications: Please see discharge summary for a list of discharge medications.  Relevant Imaging Results:  Relevant Lab Results:   Additional Information SSN: [id number], [place]

## 2023-09-15 NOTE — Progress Notes (Signed)
PHARMACY - ANTICOAGULATION CONSULT NOTE  Pharmacy Consult for Coumadin Indication:  aortic mechanical valve    Allergies  Allergen Reactions   Penicillin G Itching and Other (See Comments)   Penicillins Hives, Itching, Swelling and Rash    Patient Measurements: Height: 5\' 8"  (172.7 cm) Weight: 68.6 kg (151 lb 3.8 oz) IBW/kg (Calculated) : 63.9   Vital Signs: Temp: 97.9 F (36.6 C) (11/26 0400) Temp Source: Axillary (11/26 0400) BP: 127/71 (11/26 0727) Pulse Rate: 88 (11/26 0727)  Labs: Recent Labs    09/13/23 0405 09/14/23 0543 09/15/23 0349  HGB  --  9.7*  --   HCT  --  33.2*  --   PLT  --  204  --   LABPROT 33.7* 45.3* 55.5*  INR 3.3* 4.8* 6.3*  CREATININE 2.42* 2.42* 2.29*    Estimated Creatinine Clearance: 22.7 mL/min (A) (by C-G formula based on SCr of 2.29 mg/dL (H)).   Medical History: Past Medical History:  Diagnosis Date   Anxiety disorder    Aortic stenosis    Status post [employer] mechanical AVR 2007   Asthma    Atrial fibrillation (HCC)    Carcinoid tumor of colon 2007   Chronic diastolic heart failure (HCC)    Coronary atherosclerosis of native coronary artery    Status post CABG 2007   GERD (gastroesophageal reflux disease)    History of colonoscopy 2003   Dr. 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 Jena Gauss, benign biopsies   RLS (restless legs syndrome)    Schatzki's ring    Assessment: Patient presented with SOB. Pharmacy consulted to dose warfarin in patient with atrial fibrillation and mechanical AVR. Patient's warfarin home dose listed as 4 mg on Tuesday and 3 mg ROW.   INR appears very labile and also patient's diet appears to be poor increasing warfarin sensitivity. INR continues to rise to 6.3. No bleeding issues noted. Antibiotics and steroids as well as poor diet could be leading to increased warfarin sensitivity.   Goal of Therapy:  INR  2-3 Monitor platelets by anticoagulation protocol: Yes   Plan:  No warfarin tonight Daily PT-INR, check cbc in am Monitor for S/S of bleeding  Adrian Prows, PharmD Student 09/15/2023 7:41 AM

## 2023-09-15 NOTE — Progress Notes (Signed)
Patient ID: [name], female   DOB: 25-Jun-1951, 72 y.o.   MRN: [id number] S: Reports that she feels a little better. O:BP 116/71   Pulse 81   Temp 98 F (36.7 C) (Oral)   Resp (!) 21   Ht 5\' 8"  (1.727 m)   Wt 68.6 kg   SpO2 98%   BMI 23.00 kg/m   Intake/Output Summary (Last 24 hours) at 09/15/2023 0950 Last data filed at 09/15/2023 0851 Gross per 24 hour  Intake 243 ml  Output 475 ml  Net -232 ml   Intake/Output: I/O last 3 completed shifts: In: 363 [P.O.:360; I.V.:3] Out: 825 [Urine:825]  Intake/Output this shift:  Total I/O In: 120 [P.O.:120] Out: -  Weight change: -2.5 kg Gen: Ill-appearing, fatigued. CVS: IRR Resp:scattered rhonchi bilaterally Abd: +BS, soft, NT/ND Ext: trace pretibial edema  Recent Labs  Lab 09/09/23 0942 09/09/23 1215 09/10/23 0442 09/11/23 0502 09/12/23 0419 09/13/23 0405 09/14/23 0543 09/15/23 0349  NA 132*  --  133* 133* 133* 134* 133* 135  K 3.5  --  3.8 4.4 4.3 4.8 4.5 4.5  CL 87*  --  88* 87* 86* 88* 87* 89*  CO2 32  --  36* 36* 34* 33* 31 34*  GLUCOSE 115*  --  135* 99 89 76 98 115*  BUN 14  --  16 27* 35* 45* 58* 68*  CREATININE 1.17*  --  1.19* 1.70* 2.21* 2.42* 2.42* 2.29*  ALBUMIN  --   --   --   --   --   --   --  3.5  CALCIUM 9.1  --  8.6* 8.9 8.7* 8.9 8.9 8.9  PHOS  --  3.5  --   --   --   --   --  4.5   Liver Function Tests: Recent Labs  Lab 09/15/23 0349  ALBUMIN 3.5   No results for input(s): "LIPASE", "AMYLASE" in the last 168 hours. No results for input(s): "AMMONIA" in the last 168 hours. CBC: Recent Labs  Lab 09/09/23 0942 09/10/23 0442 09/14/23 0543  WBC 6.1 7.3 3.7*  NEUTROABS 5.3  --   --   HGB 10.4* 9.1* 9.7*  HCT 33.7* 30.7* 33.2*  MCV 91.1 91.6 92.5  PLT 191 203 204   Cardiac Enzymes: No results for input(s): "CKTOTAL", "CKMB", "CKMBINDEX", "TROPONINI" in the last 168 hours. CBG: Recent Labs  Lab 09/10/23 0736 09/10/23 2145  GLUCAP 115* 127*    Iron Studies: No results for  input(s): "IRON", "TIBC", "TRANSFERRIN", "FERRITIN" in the last 72 hours. Studies/Results: US RENAL  Result Date: 09/14/2023 CLINICAL DATA:  72 year old female with acute renal insufficiency. EXAM: RENAL / URINARY TRACT ULTRASOUND COMPLETE COMPARISON:  Chest CT 09/09/2023. FINDINGS: Right Kidney: Renal measurements: 9.9 x 6.4 x 6.7 cm = volume: 222 mL. Renal cortical thinning and increased echogenicity (image 3). No right hydronephrosis or renal mass. Left Kidney: Renal measurements: 10.0 x 5.8 x 5.1 cm = volume: 152 mL. Renal cortical thinning and echogenicity (image 23). No left hydronephrosis or solid renal mass. Bladder: Appears normal for degree of bladder distention. No ureteral jets detected. Other: None. IMPRESSION: Chronic medical renal disease. No hydronephrosis or acute renal abnormality. Electronically Signed   By: Odessa Fleming M.D.   On: 09/14/2023 12:37   DG CHEST PORT 1 VIEW  Result Date: 09/14/2023 CLINICAL DATA:  Shortness of breath. Chronic diastolic heart failure, atrial fibrillation. EXAM: PORTABLE CHEST 1 VIEW COMPARISON:  09/09/2023 and CT chest 09/09/2023. FINDINGS: Trachea is  midline. Heart is enlarged, stable. Thoracic aorta is calcified. Interstitial prominence and indistinctness, minimally improved from 09/09/2023. Partially loculated right pleural effusion, now small in size, decreased from 09/09/2023. Small left pleural effusion, stable. Probable left basilar atelectasis. IMPRESSION: 1. Improving congestive heart failure. 2. Probable left lower lobe atelectasis. Electronically Signed   By: Leanna Battles M.D.   On: 09/14/2023 10:47    bisoprolol  5 mg Oral Daily   budesonide (PULMICORT) nebulizer solution  1 mg Nebulization BID   Chlorhexidine Gluconate Cloth  6 each Topical Daily   DermaCerin   Topical BID   dextromethorphan-guaiFENesin  1 tablet Oral BID   diltiazem  120 mg Oral Daily   doxycycline  100 mg Oral Q12H   ipratropium  0.5 mg Nebulization Q6H   levalbuterol   0.63 mg Nebulization Q6H   levothyroxine  50 mcg Oral QAC breakfast   methylPREDNISolone (SOLU-MEDROL) injection  80 mg Intravenous Daily   mouth rinse  15 mL Mouth Rinse 4 times per day   pantoprazole  40 mg Oral Daily   rOPINIRole  3 mg Oral BID   rosuvastatin  10 mg Oral Daily   sodium chloride flush  3 mL Intravenous Q12H   Warfarin - Pharmacist Dosing Inpatient   Does not apply q1600    BMET    Component Value Date/Time   NA 135 09/15/2023 0349   K 4.5 09/15/2023 0349   CL 89 (L) 09/15/2023 0349   CO2 34 (H) 09/15/2023 0349   GLUCOSE 115 (H) 09/15/2023 0349   BUN 68 (H) 09/15/2023 0349   CREATININE 2.29 (H) 09/15/2023 0349   CREATININE 1.14 (H) 04/15/2013 1325   CALCIUM 8.9 09/15/2023 0349   GFRNONAA 22 (L) 09/15/2023 0349   GFRAA 32 (L) 05/30/2019 0509   CBC    Component Value Date/Time   WBC 3.7 (L) 09/14/2023 0543   RBC 3.59 (L) 09/14/2023 0543   HGB 9.7 (L) 09/14/2023 0543   HCT 33.2 (L) 09/14/2023 0543   PLT 204 09/14/2023 0543   MCV 92.5 09/14/2023 0543   MCH 27.0 09/14/2023 0543   MCHC 29.2 (L) 09/14/2023 0543   RDW 15.9 (H) 09/14/2023 0543   LYMPHSABS 0.5 (L) 09/09/2023 0942   MONOABS 0.2 09/09/2023 0942   EOSABS 0.0 09/09/2023 0942   BASOSABS 0.0 09/09/2023 0942    Assessment/Plan:  AKI/CKD stage IIIa - 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  Scr appears to have reached a plateau and hopefully will start to see an improvement.  Urine Na <10 so consistent with pre-renal insult and likely decreased effective intravascular volume +/- contrast.  Renal US without obstruction and consistent with chronic medical renal disease.  BUN/Cr continue to slowly improve off of IV lasix.  No indication for dialysis at this time.  Pt is a poor candidate for longterm dialysis given her multiple co-morbidities and poor functional and nutritional status. Avoid nephrotoxic medications including NSAIDs and iodinated intravenous  contrast exposure unless the latter is absolutely indicated.   Preferred narcotic agents for pain control are hydromorphone, fentanyl, and methadone. Morphine should not be used.  Avoid Baclofen and avoid oral sodium phosphate and magnesium citrate based laxatives / bowel preps.  Continue strict Input and Output monitoring. Will monitor the patient closely with you and intervene or adjust therapy as indicated by changes in clinical status/labs  Acute on chronic  respiratory failure - hypoxia and hypercapnia.  Likely COPD exacerbation as well as CHF.  Currently on steroids,  nebulizers, and abx per primary svc.  Bipap at bedtime. Acute on chronic diastolic CHF - initially diuresed but now on hold due to AKI. S/p mechanical AVR  - on coumadin HTN - stable DM - per primary Disposition - pt is DNR, ongoing goals of care discussions.  Irena Cords, MD [facility]

## 2023-09-15 NOTE — Progress Notes (Signed)
Patient taken off of BiPAP and placed on 15L high flow salter. Patient vitals are as follows: HR 82 sat 100 RR 29 BP 127/71. BiPAP remains at bedside for placement when needed.

## 2023-09-15 NOTE — TOC Progression Note (Signed)
Transition of Care [facility]) - Progression Note    Patient Details  Name: [name] MRN: [id number] Date of Birth: [birth year]  Transition of Care [facility]) CM/SW Contact  Villa Herb, [place] Phone Number: 09/15/2023, 2:18 PM  Clinical Narrative:    CSW updated that pt and son are reconsidering SNF at this time. CSW spoke with pts son who states they are agreeable to SNF at [facility] again if possible. CSW to send out referral. TOC to follow.   Expected Discharge Plan: Home w Home Health Services Barriers to Discharge: Continued Medical Work up  Expected Discharge Plan and Services In-house Referral: Clinical Social Work Discharge Planning Services: CM Consult Post Acute Care Choice: Home Health Living arrangements for the past 2 months: Single Family Home                           HH Arranged: RN, PT HH Agency: [facility] Date [facility] Agency Contacted: 09/10/23   Representative spoke with at [facility] Agency: Maralyn Sago   Social Determinants of Health (SDOH) Interventions SDOH Screenings   Food Insecurity: No Food Insecurity (09/09/2023)  Housing: Low Risk  (09/09/2023)  Transportation Needs: No Transportation Needs (09/09/2023)  Utilities: Not At Risk (09/09/2023)  Financial Resource Strain: Low Risk  (07/28/2018)  Physical Activity: Inactive (07/28/2018)  Social Connections: Somewhat Isolated (07/28/2018)  Stress: No Stress Concern Present (07/28/2018)  Tobacco Use: Medium Risk (09/15/2023)  Health Literacy: Medium Risk (07/01/2023)   Received from [facility]    Readmission Risk Interventions    09/10/2023    2:02 PM  Readmission Risk Prevention Plan  Transportation Screening Complete  HRI or Home Care Consult Complete  Social Work Consult for Recovery Care Planning/Counseling Complete  Palliative Care Screening Not Applicable  Medication Review Oceanographer) Complete

## 2023-09-15 NOTE — Progress Notes (Signed)
Rounding Note    Patient Name: [name] Date of Encounter: 09/15/2023  [facility] Cardiologist: Nona Dell, MD   Subjective   Ongoing SOB. Able to have some time off bipap  Inpatient Medications    Scheduled Meds:  bisoprolol  5 mg Oral Daily   budesonide (PULMICORT) nebulizer solution  1 mg Nebulization BID   Chlorhexidine Gluconate Cloth  6 each Topical Daily   DermaCerin   Topical BID   dextromethorphan-guaiFENesin  1 tablet Oral BID   diltiazem  120 mg Oral Daily   doxycycline  100 mg Oral Q12H   ipratropium  0.5 mg Nebulization Q6H   levalbuterol  0.63 mg Nebulization Q6H   levothyroxine  50 mcg Oral QAC breakfast   methylPREDNISolone (SOLU-MEDROL) injection  80 mg Intravenous Daily   mouth rinse  15 mL Mouth Rinse 4 times per day   pantoprazole  40 mg Oral Daily   rOPINIRole  3 mg Oral BID   rosuvastatin  10 mg Oral Daily   sodium chloride flush  3 mL Intravenous Q12H   Warfarin - Pharmacist Dosing Inpatient   Does not apply q1600   Continuous Infusions:  phytonadione (VITAMIN K) 5 mg in dextrose 5 % 50 mL IVPB     PRN Meds: acetaminophen **OR** acetaminophen, LORazepam, ondansetron **OR** ondansetron (ZOFRAN) IV, mouth rinse, oxyCODONE, sodium chloride, sodium chloride flush   Vital Signs    Vitals:   09/15/23 0600 09/15/23 0708 09/15/23 0727 09/15/23 0808  BP: 127/71  127/71   Pulse: 74 73 88   Resp: (!) 25 (!) 22 (!) 26   Temp:    98 F (36.7 C)  TempSrc:    Oral  SpO2: 99% 100% 99%   Weight:      Height:        Intake/Output Summary (Last 24 hours) at 09/15/2023 0828 Last data filed at 09/15/2023 0517 Gross per 24 hour  Intake 363 ml  Output 475 ml  Net -112 ml      09/15/2023    5:17 AM 09/14/2023    1:02 AM 09/13/2023    5:00 AM  Last 3 Weights  Weight (lbs) 151 lb 3.8 oz 156 lb 12 oz 151 lb 0.2 oz  Weight (kg) 68.6 kg 71.1 kg 68.5 kg      Telemetry    Rate controlled afib - Personally Reviewed  ECG     N/a - Personally Reviewed  Physical Exam   GEN: No acute distress.   Neck: +JVD Cardiac: irreg Respiratory: coarse bilaterally GI: Soft, nontender, non-distended  MS: No edema; No deformity. Neuro:  Nonfocal  Psych: Normal affect   Labs    High Sensitivity Troponin:   Recent Labs  Lab 09/09/23 0942 09/09/23 1210  TROPONINIHS 19* 20*     Chemistry Recent Labs  Lab 09/09/23 1215 09/10/23 0442 09/13/23 0405 09/14/23 0543 09/15/23 0349  NA  --    < > 134* 133* 135  K  --    < > 4.8 4.5 4.5  CL  --    < > 88* 87* 89*  CO2  --    < > 33* 31 34*  GLUCOSE  --    < > 76 98 115*  BUN  --    < > 45* 58* 68*  CREATININE  --    < > 2.42* 2.42* 2.29*  CALCIUM  --    < > 8.9 8.9 8.9  MG 2.9*  --   --   --   --  ALBUMIN  --   --   --   --  3.5  GFRNONAA  --    < > 21* 21* 22*  ANIONGAP  --    < > 13 15 12    < > = values in this interval not displayed.    Lipids No results for input(s): "CHOL", "TRIG", "HDL", "LABVLDL", "LDLCALC", "CHOLHDL" in the last 168 hours.  Hematology Recent Labs  Lab 09/09/23 0942 09/10/23 0442 09/14/23 0543  WBC 6.1 7.3 3.7*  RBC 3.70* 3.35* 3.59*  HGB 10.4* 9.1* 9.7*  HCT 33.7* 30.7* 33.2*  MCV 91.1 91.6 92.5  MCH 28.1 27.2 27.0  MCHC 30.9 29.6* 29.2*  RDW 16.0* 16.2* 15.9*  PLT 191 203 204   Thyroid  Recent Labs  Lab 09/09/23 1215  TSH 2.652    BNP Recent Labs  Lab 09/09/23 0942  BNP 849.0*    DDimer No results for input(s): "DDIMER" in the last 168 hours.   Radiology    US RENAL  Result Date: 09/14/2023 CLINICAL DATA:  72 year old female with acute renal insufficiency. EXAM: RENAL / URINARY TRACT ULTRASOUND COMPLETE COMPARISON:  Chest CT 09/09/2023. FINDINGS: Right Kidney: Renal measurements: 9.9 x 6.4 x 6.7 cm = volume: 222 mL. Renal cortical thinning and increased echogenicity (image 3). No right hydronephrosis or renal mass. Left Kidney: Renal measurements: 10.0 x 5.8 x 5.1 cm = volume: 152 mL. Renal cortical thinning  and echogenicity (image 23). No left hydronephrosis or solid renal mass. Bladder: Appears normal for degree of bladder distention. No ureteral jets detected. Other: None. IMPRESSION: Chronic medical renal disease. No hydronephrosis or acute renal abnormality. Electronically Signed   By: Odessa Fleming M.D.   On: 09/14/2023 12:37   DG CHEST PORT 1 VIEW  Result Date: 09/14/2023 CLINICAL DATA:  Shortness of breath. Chronic diastolic heart failure, atrial fibrillation. EXAM: PORTABLE CHEST 1 VIEW COMPARISON:  09/09/2023 and CT chest 09/09/2023. FINDINGS: Trachea is midline. Heart is enlarged, stable. Thoracic aorta is calcified. Interstitial prominence and indistinctness, minimally improved from 09/09/2023. Partially loculated right pleural effusion, now small in size, decreased from 09/09/2023. Small left pleural effusion, stable. Probable left basilar atelectasis. IMPRESSION: 1. Improving congestive heart failure. 2. Probable left lower lobe atelectasis. Electronically Signed   By: Leanna Battles M.D.   On: 09/14/2023 10:47    Cardiac Studies    Patient Profile     [name] is a 72 y.o. female with a hx of aortic stenosis s/p mechanical AVR and ascending aortic aneurysm repair, chronic HFpEF, CAD with prior CABG, CKD 3, permanent afib, chronic respiratory failiure on 2 LNC  who is being seen 09/14/2023 for the evaluation of SOB at the request of Dr Gwenlyn Perking.   Assessment & Plan    Acute on chronic HFpEF/Mild RV dysfunction with RV pressure/volume overload - 10/2022 echo: LVEF 65-70%, no WMAs, indet diastolic function, RV pressure and volume overload with D shaped septum, mild RV dysfunction, PASP 44, severe LAE, questionable LA mass - CXR likely pulmonary edema, +bilateral pleural effusions.BNP 849. Baseline weight from last admit looks to be aournd 160 lbs, weight on admit 154 lbs   -had been on IV lasix 40mg  bid, last dose 11/22 AM Cr 1.17 on admission, up to 2.42 and thus diuretics stopped. I/Os  incomplete. Reds vest 33% yesterday suggesting euvolemic. CXR yesterday with improving HF pattern - Cr starting to trend down, continue to hold diuretics today.    - history chronic respiratory failure on home O2, HFrEF  massively dilated LA with LAVI 123 suggest long standing severely elevated left sided pressures. 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 RV function with TAPSE 1.7, s' 0.9. Overall mildly decreased.  - in general does not appear markedly volume overloaded, hold on diuretics with AKI. May resume gentle diuresis when renal function allows. I think primary breathing issue is pulmonary in origin.      2.History of mechanical AVR -08/2023 echo: normal functoning AVR - coumadin per pharmacy   3. Possible LA mass - unclear etiology, not stable for additional imaging at this time.    4. AKI - followed by renal   5. COPD exacerbation/chronic home O2 - hypercapneic and hypoxia on ABG - nebs, steroids, abx per primary team   6. Permanent afib - she is on bispoprolol, diltiazem. Coumadin for anticoag in setting of afib with mechanical AVR      For questions or updates, please contact  [facility] Please consult [email or web address] for contact info under        Signed, Dina Rich, MD  09/15/2023, 8:28 AM

## 2023-09-15 NOTE — Consult Note (Signed)
Consultation Note Date: 09/15/2023   Patient Name: [name]  DOB: 1951/03/12  MRN: [id number]  Age / Sex: 72 y.o., female  PCP: Rebekah Chesterfield, NP Referring Physician: Vassie Loll, MD  Reason for Consultation: Establishing goals of care  HPI/Patient Profile: 71 y.o. female  with past medical history of  chronic diastolic heart failure, atrial fibrillation, history of aortic valve replacement (mechanical valve) chronically on Coumadin, history of COPD, chronic respiratory failure (on 2 L at baseline), gastroesophageal reflux disease, hypothyroidism, Paget's disease and restless leg syndrome admitted on 09/09/2023 with acute on chronic respiratory failure with hypoxia and acute on chronic CKD 3, improving.   Clinical Assessment and Goals of Care: I have reviewed medical records including EPIC notes, labs and imaging, received report from RN, assessed the patient.    We meet at the bedside to discuss diagnosis prognosis, GOC, EOL wishes, disposition and options.  I introduced Palliative Medicine as specialized medical care for people living with serious illness. It focuses on providing relief from the symptoms and stress of a serious illness. The goal is to improve quality of life for both the patient and the family.  We discussed a brief life review of the patient.  Son [name] shares that [name] is a widow x 2.  She also lost her 2 daughters.  [name] lives in [name]'s home with her.  We talk about [name]'s flat affect.  He shares that she had a mammogram in October which showed suspicion for Paget's disease.  She also had a fall with a tip fracture and 1 month in short-term rehab in August.  We talked about her chronic illness burden, what is normal and expected.  I shared that her disease process is progressive.  [name] states understanding that sometimes slowing is possible but not  reversal.  We focused on their current illness.  We talk about [name]'s acute respiratory issues and the treatment plan.  We talked about her kidney function and the treatment plan.  We talk about time for outcomes.  [name] shares that his mother had a fall in August with the tib fracture and spent approximately 1 month in [place] [facility] for rehab.  He states that he believes that she would be agreeable for rehab again.  He shared that initially, he would like he can care for her at home.  We talked about time for outcomes.  The natural disease trajectory and expectations at EOL were discussed.  Advanced directives, concepts specific to code status, artifical feeding and hydration, and rehospitalization were considered and discussed.  DNR discussed and verified with son.  Hospice and Palliative Care services outpatient were explained and offered.  We talked about the benefits of outpatient palliative/hospice care.  [name] seems knowledgeable about hospice care sharing that he knows people who have been on the hospice service for almost 2 years.  We talked about the benefits of at home "treat the treatable" hospice care.  Discussed the importance of continued conversation with family and the medical  providers regarding overall plan of care and treatment options, ensuring decisions are within the context of the patient's values and GOCs.  Questions and concerns were addressed. The family was encouraged to call with questions or concerns.  PMT will continue to support holistically.  Conference with attending, bedside nursing staff, respiratory therapist, transition of care team related to patient condition, needs, goals of care, disposition.   HCPOA  NEXT OF KIN -son, [name].  [name] is a widow x 2.  [name] is her only living child, she has 2 deceased daughters.    SUMMARY OF RECOMMENDATIONS   At this point continue to treat the treatable but no CPR or intubation. Time for  outcomes Anticipate need for short-term rehab Considering outpatient palliative/"treat the treatable" hospice care.   Code Status/Advance Care Planning: DNR  Symptom Management:  Per hospitalist, no additional needs at this time.  Palliative Prophylaxis:  Frequent Pain Assessment and Oral Care  Additional Recommendations (Limitations, Scope, Preferences): Continue to treat but no CPR or intubation  Psycho-social/Spiritual:  Desire for further Chaplaincy support:no Additional Recommendations: Caregiving  Support/Resources and Education on Hospice  Prognosis:  Unable to determine, based on outcomes.  6 months or less would not be surprising based on chronic illness burden, decreasing functional status, frailty.  Discharge Planning: Considering short-term rehab, To Be Determined      Primary Diagnoses: Present on Admission:  Acute on chronic respiratory failure with hypoxia (HCC)  Hyperlipidemia  Atrial fibrillation, chronic (HCC)  GERD  COPD with acute exacerbation (HCC)  Acute on chronic diastolic congestive heart failure (HCC)  Hypothyroidism  CKD (chronic kidney disease) stage 3, GFR 30-59 ml/min (HCC)  Benign essential HTN  Right tibial fracture   I have reviewed the medical record, interviewed the patient and family, and examined the patient. The following aspects are pertinent.  Past Medical History:  Diagnosis Date   Anxiety disorder    Aortic stenosis    Status post [employer] mechanical AVR 2007   Asthma    Atrial fibrillation (HCC)    Carcinoid tumor of colon 2007   Chronic diastolic heart failure (HCC)    Coronary atherosclerosis of native coronary artery    Status post CABG 2007   GERD (gastroesophageal reflux disease)    History of colonoscopy 2003   Dr. 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 Jena Gauss, benign biopsies   RLS (restless legs syndrome)    Schatzki's  ring    Social History   Socioeconomic History   Marital status: Married    Spouse name: Not on file   Number of children: 1   Years of education: stopped in 10th grade   Highest education level: GED or equivalent  Occupational History   Occupation: Retired  Tobacco Use   Smoking status: Former    Current packs/day: 0.00    Average packs/day: 1 pack/day for 20.0 years (20.0 ttl pk-yrs)    Types: Cigarettes    Start date: 10/20/1968    Quit date: 10/20/1988    Years since quitting: 34.9   Smokeless tobacco: Never  Vaping Use   Vaping status: Never Used  Substance and Sexual Activity   Alcohol use: No    Alcohol/week: 0.0 standard drinks of alcohol   Drug use: No   Sexual activity: Yes    Partners: Male    Birth control/protection: None    Comment:  spouse  Other Topics Concern   Not on file  Social History Narrative   Not on file   Social Determinants of Health   Financial Resource Strain: Low Risk  (07/28/2018)   Overall Financial Resource Strain (CARDIA)    Difficulty of Paying Living Expenses: Not hard at all  Food Insecurity: No Food Insecurity (09/09/2023)   Hunger Vital Sign    Worried About Running Out of Food in the Last Year: Never true    Ran Out of Food in the Last Year: Never true  Transportation Needs: No Transportation Needs (09/09/2023)   PRAPARE - Administrator, Civil Service (Medical): No    Lack of Transportation (Non-Medical): No  Physical Activity: Inactive (07/28/2018)   Exercise Vital Sign    Days of Exercise per Week: 0 days    Minutes of Exercise per Session: 0 min  Stress: No Stress Concern Present (07/28/2018)   [employer] of Occupational Health - Occupational Stress Questionnaire    Feeling of Stress : Not at all  Social Connections: Somewhat Isolated (07/28/2018)   Social Connection and Isolation Panel [NHANES]    Frequency of Communication with Friends and Family: Never    Frequency of Social Gatherings with Friends and  Family: Never    Attends Religious Services: More than 4 times per year    Active Member of [employer] or Organizations: No    Attends Engineer, structural: Never    Marital Status: Married   Family History  Problem Relation Age of Onset   Stroke Mother    Cancer Father    Anesthesia problems Neg Hx    Hypotension Neg Hx    Malignant hyperthermia Neg Hx    Pseudochol deficiency Neg Hx    Scheduled Meds:  bisoprolol  5 mg Oral Daily   budesonide (PULMICORT) nebulizer solution  1 mg Nebulization BID   Chlorhexidine Gluconate Cloth  6 each Topical Daily   DermaCerin   Topical BID   dextromethorphan-guaiFENesin  1 tablet Oral BID   diltiazem  120 mg Oral Daily   doxycycline  100 mg Oral Q12H   ipratropium  0.5 mg Nebulization Q6H   levalbuterol  0.63 mg Nebulization Q6H   levothyroxine  50 mcg Oral QAC breakfast   methylPREDNISolone (SOLU-MEDROL) injection  80 mg Intravenous Daily   mouth rinse  15 mL Mouth Rinse 4 times per day   pantoprazole  40 mg Oral Daily   rOPINIRole  3 mg Oral BID   rosuvastatin  10 mg Oral Daily   sodium chloride flush  3 mL Intravenous Q12H   Warfarin - Pharmacist Dosing Inpatient   Does not apply q1600   Continuous Infusions:  phytonadione (VITAMIN K) 5 mg in dextrose 5 % 50 mL IVPB     PRN Meds:.acetaminophen (TYLENOL) oral liquid 160 mg/5 mL, LORazepam, ondansetron **OR** ondansetron (ZOFRAN) IV, mouth rinse, oxyCODONE, sodium chloride, sodium chloride flush Medications Prior to Admission:  Prior to Admission medications   Medication Sig Start Date End Date Taking? Authorizing Provider  ALPRAZolam Prudy Feeler) 0.5 MG tablet Take 1 tablet (0.5 mg total) by mouth 2 (two) times daily. 06/24/23  Yes Tat, Amy Hua, MD  bisoprolol (ZEBETA) 5 MG tablet Take 5 mg by mouth daily.   Yes [provider]  CAPLYTA 42 MG capsule Take 42 mg by mouth daily. 02/17/22  Yes [provider]  diltiazem (CARDIZEM CD) 120 MG 24 hr capsule Take 1 capsule (120  mg total) by mouth daily.  04/01/18  Yes Vassie Loll, MD  FARXIGA 10 MG TABS tablet Take 10 mg by mouth daily. 01/21/21  Yes [provider]  ipratropium (ATROVENT) 0.06 % nasal spray Place 2 sprays into both nostrils in the morning and at bedtime.   Yes [provider]  levothyroxine (SYNTHROID, LEVOTHROID) 50 MCG tablet Take 50 mcg by mouth daily before breakfast.    Yes [provider]  Multiple Vitamin (MULTIVITAMIN WITH MINERALS) TABS tablet Take 1 tablet by mouth daily.   Yes [provider]  omeprazole (PRILOSEC) 20 MG capsule Take 20 mg by mouth daily.   Yes [provider]  oxyCODONE 10 MG TABS Take 1 tablet (10 mg total) by mouth every 4 (four) hours as needed for severe pain. 06/24/23  Yes Tat, Amy Hua, MD  OXYGEN Inhale 4 L into the lungs daily.   Yes [provider]  oxymetazoline (AFRIN) 0.05 % nasal spray Place 1 spray into both nostrils 2 (two) times daily as needed for congestion.   Yes [provider]  rOPINIRole (REQUIP) 3 MG tablet Take 3 mg by mouth 2 (two) times daily.   Yes [provider]  rosuvastatin (CRESTOR) 20 MG tablet TAKE ONE (1) TABLET BY MOUTH EVERY DAY 02/06/23  Yes Jonelle Sidle, MD  torsemide (DEMADEX) 100 MG tablet Take 100 mg by mouth daily.   Yes [provider]  warfarin (COUMADIN) 2 MG tablet Take 1.5-2 tablets (3-4 mg total) by mouth See admin instructions. 4mg  on Tuesday and 3 mg daily the rest of the week. 09/06/18  Yes Vassie Loll, MD   Allergies  Allergen Reactions   Penicillin G Itching and Other (See Comments)   Penicillins Hives, Itching, Swelling and Rash   Review of Systems  Unable to perform ROS: Acuity of condition    Physical Exam Vitals and nursing note reviewed.  Constitutional:      General: She is not in acute distress.    Appearance: She is ill-appearing.  Cardiovascular:     Rate and Rhythm: Normal rate.  Pulmonary:     Effort: Pulmonary effort  is normal. No tachypnea.  Skin:    General: Skin is warm and dry.  Neurological:     Comments: Oriented to person only  Psychiatric:     Comments: Calm, flat affect     Vital Signs: BP 116/71   Pulse 81   Temp 98 F (36.7 C) (Oral)   Resp (!) 21   Ht 5\' 8"  (1.727 m)   Wt 68.6 kg   SpO2 98%   BMI 23.00 kg/m  Pain Scale: CPOT POSS *See Group Information*: 2-Acceptable,Slightly drowsy, easily aroused Pain Score: 1    SpO2: SpO2: 98 % O2 Device:SpO2: 98 % O2 Flow Rate: .O2 Flow Rate (L/min): 15 L/min  IO: Intake/output summary:  Intake/Output Summary (Last 24 hours) at 09/15/2023 0909 Last data filed at 09/15/2023 0851 Gross per 24 hour  Intake 483 ml  Output 475 ml  Net 8 ml    LBM: Last BM Date : 09/09/23 Baseline Weight: Weight: 69.9 kg Most recent weight: Weight: 68.6 kg     Palliative Assessment/Data:     Time In: 0810     Time Out: 0925 Time Total: 75 minutes  Greater than 50%  of this time was spent counseling and coordinating care related to the above assessment and plan.  Signed by: Katheran Awe, NP   Please contact Palliative Medicine Team phone at [phone] for questions and concerns.  For individual provider: See Loretha Stapler

## 2023-09-15 NOTE — Progress Notes (Signed)
Patient taken off of BiPAP and placed on 15L salter. Patient tolerating well at this time. BiPAP remains at bedside when needed. RN aware.

## 2023-09-16 DIAGNOSIS — Z7189 Other specified counseling: Secondary | ICD-10-CM | POA: Diagnosis not present

## 2023-09-16 DIAGNOSIS — I5033 Acute on chronic diastolic (congestive) heart failure: Secondary | ICD-10-CM | POA: Diagnosis not present

## 2023-09-16 DIAGNOSIS — Z515 Encounter for palliative care: Secondary | ICD-10-CM | POA: Diagnosis not present

## 2023-09-16 DIAGNOSIS — J9621 Acute and chronic respiratory failure with hypoxia: Secondary | ICD-10-CM | POA: Diagnosis not present

## 2023-09-16 LAB — RENAL FUNCTION PANEL
Albumin: 3.7 g/dL (ref 3.5–5.0)
Anion gap: 12 (ref 5–15)
BUN: 64 mg/dL — ABNORMAL HIGH (ref 8–23)
CO2: 33 mmol/L — ABNORMAL HIGH (ref 22–32)
Calcium: 9.1 mg/dL (ref 8.9–10.3)
Chloride: 89 mmol/L — ABNORMAL LOW (ref 98–111)
Creatinine, Ser: 1.97 mg/dL — ABNORMAL HIGH (ref 0.44–1.00)
GFR, Estimated: 27 mL/min — ABNORMAL LOW (ref >60)
Glucose, Bld: 120 mg/dL — ABNORMAL HIGH (ref 70–99)
Phosphorus: 3.2 mg/dL (ref 2.5–4.6)
Potassium: 4.2 mmol/L (ref 3.5–5.1)
Sodium: 134 mmol/L — ABNORMAL LOW (ref 135–145)

## 2023-09-16 LAB — PROTIME-INR
INR: 1.5 — ABNORMAL HIGH (ref 0.8–1.2)
Prothrombin Time: 18.3 s — ABNORMAL HIGH (ref 11.4–15.2)

## 2023-09-16 MED ORDER — SENNOSIDES-DOCUSATE SODIUM 8.6-50 MG PO TABS
2.0000 | ORAL_TABLET | Freq: Every day | ORAL | Status: DC
  Administered 2023-09-16 – 2023-09-22 (×5): 2 via ORAL
  Filled 2023-09-16 (×6): qty 2

## 2023-09-16 MED ORDER — POLYETHYLENE GLYCOL 3350 17 G PO PACK
17.0000 g | PACK | Freq: Every day | ORAL | Status: DC
  Administered 2023-09-16 – 2023-09-23 (×8): 17 g via ORAL
  Filled 2023-09-16 (×8): qty 1

## 2023-09-16 MED ORDER — WARFARIN SODIUM 2.5 MG PO TABS
2.5000 mg | ORAL_TABLET | Freq: Once | ORAL | Status: AC
  Administered 2023-09-16: 2.5 mg via ORAL
  Filled 2023-09-16: qty 1

## 2023-09-16 MED ORDER — ENSURE ENLIVE PO LIQD
237.0000 mL | Freq: Two times a day (BID) | ORAL | Status: DC
  Administered 2023-09-16 – 2023-09-21 (×8): 237 mL via ORAL

## 2023-09-16 NOTE — Progress Notes (Addendum)
Speech Language Pathology Treatment: Dysphagia  Patient Details Name: [name] MRN: [id number] DOB: October 24, 1950 Today's Date: 09/16/2023 Time: 8295-6213 SLP Time Calculation (min) (ACUTE ONLY): 11 min  Assessment / Plan / Recommendation Clinical Impression  Ongoing dysphagia treatment provided today; Pt accepted limited trials of thin liquids and SLP observed pill administration by RN. Pt consumed thin liquids and took pill without overt s/sx of oropharyngeal dysphagia. As previously mentioned on BSE, Pt is at increased risk for aspiration given deconditioned status, SOB and compromised respiratory status (currently requiring 12L/mn). Reviewed universal aspiration precautions and strategies for coordination of breathing and swallowing including slow consumption rate and taking breaks to breathe. Pt reports very little intake and appetite at this time. Recommend continue with current diet. There are no further ST needs noted at this time. If indicated, please re-consult. Our service will sign off. Thank you,    HPI HPI: [name] is a 72 y.o. female with medical history significant of chronic diastolic heart failure, atrial fibrillation, history of aortic valve replacement (mechanical valve) chronically on Coumadin, history of COPD, chronic respiratory failure (on 2 L at baseline), gastroesophageal reflux disease, hypothyroidism, Paget's disease and restless leg syndrome; who presented to the hospital secondary to worsening shortness of breath.  Symptoms has been present for the last 3-4 days and worsening.  Family noticed decrease in care station capacity due to shortness of breath and also increased requirement for oxygen supplementation.  At home bronchodilator management has failed to improve symptoms.      SLP Plan  All goals met;Discharge SLP treatment due to (comment)      Recommendations for follow up therapy are one component of a multi-disciplinary discharge planning process,  led by the attending physician.  Recommendations may be updated based on patient status, additional functional criteria and insurance authorization.    Recommendations  Diet recommendations: Regular;Thin liquid Liquids provided via: Cup;Straw Medication Administration: Whole meds with liquid Supervision: Patient able to self feed Compensations: Minimize environmental distractions;Slow rate;Small sips/bites Postural Changes and/or Swallow Maneuvers: Seated upright 90 degrees;Upright 30-60 min after meal                  Oral care BID   None Dysphagia, unspecified (R13.10)     All goals met;Discharge SLP treatment due to (comment)    Arvada Seaborn H. Romie Levee, CCC-SLP Speech Language Pathologist  Georgetta Haber  09/16/2023, 3:22 PM

## 2023-09-16 NOTE — Progress Notes (Addendum)
PHARMACY - ANTICOAGULATION CONSULT NOTE  Pharmacy Consult for Coumadin Indication:  aortic mechanical valve    Allergies  Allergen Reactions   Penicillin G Itching and Other (See Comments)   Penicillins Hives, Itching, Swelling and Rash    Patient Measurements: Height: 5\' 8"  (172.7 cm) Weight: 61.8 kg (136 lb 3.9 oz) IBW/kg (Calculated) : 63.9   Vital Signs: Temp: 97.5 F (36.4 C) (11/27 0753) Temp Source: Oral (11/27 0753) BP: 127/89 (11/27 0819) Pulse Rate: 83 (11/27 0900)  Labs: Recent Labs    09/14/23 0543 09/15/23 0349 09/16/23 0504  HGB 9.7*  --   --   HCT 33.2*  --   --   PLT 204  --   --   LABPROT 45.3* 55.5* 18.3*  INR 4.8* 6.3* 1.5*  CREATININE 2.42* 2.29* 1.97*    Estimated Creatinine Clearance: 25.6 mL/min (A) (by C-G formula based on SCr of 1.97 mg/dL (H)).   Medical History: Past Medical History:  Diagnosis Date   Anxiety disorder    Aortic stenosis    Status post St. Jude mechanical AVR 2007   Asthma    Atrial fibrillation (HCC)    Carcinoid tumor of colon 2007   Chronic diastolic heart failure (HCC)    Coronary atherosclerosis of native coronary artery    Status post CABG 2007   GERD (gastroesophageal reflux disease)    History of colonoscopy 2003   Dr. 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 Jena Gauss, benign biopsies   RLS (restless legs syndrome)    Schatzki's ring    Assessment: Patient presented with SOB. Pharmacy consulted to dose warfarin in patient with atrial fibrillation and mechanical AVR. Patient's warfarin home dose listed as 4 mg on Tuesday and 3 mg ROW.   INR appears very labile and also patient's diet appears to be poor increasing warfarin sensitivity.   INR 6.3 >> 1.5 due to Vitamin K 5 mg IV given on 11/26. Will likely take a few days to get back to therapeutic due to this.  Goal of Therapy:  INR 2-3 Monitor platelets by  anticoagulation protocol: Yes   Plan:  Warfarin 2.5 mg x 1 dose- lower than home dosing since supratherapeutic recently  Daily PT-INR Monitor for S/S of bleeding  Judeth Cornfield, PharmD Clinical Pharmacist 09/16/2023 10:11 AM

## 2023-09-16 NOTE — Progress Notes (Signed)
Rounding Note    Patient Name: [name] Date of Encounter: 09/16/2023  [facility] Cardiologist: Nona Dell, MD   Subjective   Ongoing SOB but improving  Inpatient Medications    Scheduled Meds:  bisoprolol  5 mg Oral Daily   budesonide (PULMICORT) nebulizer solution  1 mg Nebulization BID   Chlorhexidine Gluconate Cloth  6 each Topical Daily   DermaCerin   Topical BID   diltiazem  120 mg Oral Daily   doxycycline  100 mg Oral Q12H   ipratropium  0.5 mg Nebulization Q6H   levalbuterol  0.63 mg Nebulization Q6H   levothyroxine  50 mcg Oral QAC breakfast   methylPREDNISolone (SOLU-MEDROL) injection  80 mg Intravenous Daily   mouth rinse  15 mL Mouth Rinse 4 times per day   pantoprazole  40 mg Oral Daily   rOPINIRole  3 mg Oral BID   rosuvastatin  10 mg Oral Daily   sodium chloride flush  3 mL Intravenous Q12H   Warfarin - Pharmacist Dosing Inpatient   Does not apply q1600   Continuous Infusions:  PRN Meds: acetaminophen (TYLENOL) oral liquid 160 mg/5 mL, guaiFENesin, LORazepam, ondansetron **OR** ondansetron (ZOFRAN) IV, mouth rinse, oxyCODONE, sodium chloride, sodium chloride flush   Vital Signs    Vitals:   09/16/23 0546 09/16/23 0600 09/16/23 0714 09/16/23 0753  BP:  130/84    Pulse: 81 85    Resp: 19 14    Temp:    (!) 97.5 F (36.4 C)  TempSrc:    Oral  SpO2: 97% 97% 100%   Weight:      Height:        Intake/Output Summary (Last 24 hours) at 09/16/2023 0821 Last data filed at 09/16/2023 0500 Gross per 24 hour  Intake 390 ml  Output 1180 ml  Net -790 ml      09/16/2023    5:00 AM 09/15/2023    5:17 AM 09/14/2023    1:02 AM  Last 3 Weights  Weight (lbs) 136 lb 3.9 oz 151 lb 3.8 oz 156 lb 12 oz  Weight (kg) 61.8 kg 68.6 kg 71.1 kg      Telemetry    Rate controlled afib - Personally Reviewed  ECG    N/a - Personally Reviewed  Physical Exam   GEN: No acute distress.   Neck: No JVD Cardiac: RRR, no murmurs, rubs,  or gallops.  Respiratory: coarse bilateral bases GI: Soft, nontender, non-distended  MS: No edema; No deformity. Neuro:  Nonfocal  Psych: Normal affect   Labs    High Sensitivity Troponin:   Recent Labs  Lab 09/09/23 0942 09/09/23 1210  TROPONINIHS 19* 20*     Chemistry Recent Labs  Lab 09/09/23 1215 09/10/23 0442 09/14/23 0543 09/15/23 0349 09/16/23 0504  NA  --    < > 133* 135 134*  K  --    < > 4.5 4.5 4.2  CL  --    < > 87* 89* 89*  CO2  --    < > 31 34* 33*  GLUCOSE  --    < > 98 115* 120*  BUN  --    < > 58* 68* 64*  CREATININE  --    < > 2.42* 2.29* 1.97*  CALCIUM  --    < > 8.9 8.9 9.1  MG 2.9*  --   --   --   --   ALBUMIN  --   --   --  3.5 3.7  GFRNONAA  --    < > 21* 22* 27*  ANIONGAP  --    < > 15 12 12    < > = values in this interval not displayed.    Lipids No results for input(s): "CHOL", "TRIG", "HDL", "LABVLDL", "LDLCALC", "CHOLHDL" in the last 168 hours.  Hematology Recent Labs  Lab 09/09/23 0942 09/10/23 0442 09/14/23 0543  WBC 6.1 7.3 3.7*  RBC 3.70* 3.35* 3.59*  HGB 10.4* 9.1* 9.7*  HCT 33.7* 30.7* 33.2*  MCV 91.1 91.6 92.5  MCH 28.1 27.2 27.0  MCHC 30.9 29.6* 29.2*  RDW 16.0* 16.2* 15.9*  PLT 191 203 204   Thyroid  Recent Labs  Lab 09/09/23 1215  TSH 2.652    BNP Recent Labs  Lab 09/09/23 0942  BNP 849.0*    DDimer No results for input(s): "DDIMER" in the last 168 hours.   Radiology    US RENAL  Result Date: 09/14/2023 CLINICAL DATA:  72 year old female with acute renal insufficiency. EXAM: RENAL / URINARY TRACT ULTRASOUND COMPLETE COMPARISON:  Chest CT 09/09/2023. FINDINGS: Right Kidney: Renal measurements: 9.9 x 6.4 x 6.7 cm = volume: 222 mL. Renal cortical thinning and increased echogenicity (image 3). No right hydronephrosis or renal mass. Left Kidney: Renal measurements: 10.0 x 5.8 x 5.1 cm = volume: 152 mL. Renal cortical thinning and echogenicity (image 23). No left hydronephrosis or solid renal mass. Bladder:  Appears normal for degree of bladder distention. No ureteral jets detected. Other: None. IMPRESSION: Chronic medical renal disease. No hydronephrosis or acute renal abnormality. Electronically Signed   By: Odessa Fleming M.D.   On: 09/14/2023 12:37    Cardiac Studies     Patient Profile     [name] is a 71 y.o. female with a hx of aortic stenosis s/p mechanical AVR and ascending aortic aneurysm repair, chronic HFpEF, CAD with prior CABG, CKD 3, permanent afib, chronic respiratory failiure on 2 LNC who is being seen 09/14/2023 for the evaluation of SOB at the request of Dr Gwenlyn Perking.   Assessment & Plan    Acute on chronic HFpEF/Mild RV dysfunction with RV pressure/volume overload - 10/2022 echo: LVEF 65-70%, no WMAs, indet diastolic function, RV pressure and volume overload with D shaped septum, mild RV dysfunction, PASP 44, severe LAE, questionable LA mass - CXR likely pulmonary edema, +bilateral pleural effusions.BNP 849. Baseline weight from last admit looks to be aournd 160 lbs, weight on admit 154 lbs   -had been on IV lasix 40mg  bid, last dose 11/22 AM Cr 1.17 on admission, up to 2.42 and thus diuretics stopped. I/Os incomplete. Last reds vest 33% suggesting euvolemic. Last CXR with improving HF pattern - Cr starting to trend down but still not to baseline, continue to hold diuretics.    - history of chronic respiratory failure on home O2, HFrEF massively dilated LA with LAVI 123 suggest long standing severely elevated left sided pressures. 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 RV function with TAPSE 1.7, s' 0.9. Overall mildly decreased RV function - in general does not appear markedly volume overloaded, hold on diuretics with AKI. May resume gentle diuresis when renal function allows. I think primary breathing issue is pulmonary in origin.      2.History of mechanical AVR -08/2023 echo: normal functoning AVR - coumadin per pharmacy   3. Possible LA mass -  unclear etiology, not stable for additional imaging at this time.    4. AKI -  followed by renal   5. COPD exacerbation/chronic home O2 - hypercapneic and hypoxia on ABG - nebs, steroids, abx per primary team   6. Permanent afib - she is on bispoprolol, diltiazem. Coumadin for anticoag in setting of afib with mechanical AVR      For questions or updates, please contact [facility] Please consult [email or web address] for contact info under        Signed, Dina Rich, MD  09/16/2023, 8:21 AM

## 2023-09-16 NOTE — Progress Notes (Signed)
Progress Note   Patient: [name]:[id number] DOB: [birth year] DOA: 09/09/2023     7 DOS: the patient was seen and examined on 09/16/2023   Brief hospital course: [name] is a 72 y.o. female with medical history significant of chronic diastolic heart failure, atrial fibrillation, history of aortic valve replacement (mechanical valve) chronically on Coumadin, history of COPD, chronic respiratory failure (on 2 L at baseline), gastroesophageal reflux disease, hypothyroidism, Paget's disease and restless leg syndrome; who presented to the hospital secondary to worsening shortness of breath.  Symptoms has been present for the last 3-4 days and worsening.  Family noticed decrease in care station capacity due to shortness of breath and also increased requirement for oxygen supplementation.  At home bronchodilator management has failed to improve symptoms.   Patient reports dry coughing spells, increased wheezing and decreased appetite.  No fever, no chest pain, no abdominal pain, no dysuria, no hematuria, no sick contacts, no focal weaknesses or any other complaints.   Per family at bedside patient actively seeing for positive left nipple changes and breast discharge.   Nebulizer management, IV antibiotics and IV Lasix initiated at presentation.  COVID PCR negative.   CT chest: Demonstrating moderate cardiomegaly, enlarged pulmonary arteries suggesting pulmonary hypertension and the presence of mild bilateral pleural effusion along with atelectasis/bronchitic changes on the adjacent lower lobes.  Mild bilateral pulmonary edema appreciated.  Assessment and Plan: 1-acute on chronic respiratory failure with hypoxia and hypercapnia -Appears to be multifactorial in the setting of COPD exacerbation and CHF exacerbation -Hypoxia persist requiring high flow oxygen at 12 L alternating with BiPAP -ABG with hypercapnia and uncompensated respiratory acidosis despite BiPAP use -At baseline usually  uses 3 and half liters of oxygen via nasal cannula prior to admission -Continue treatment with steroids, nebulizer management,  -Completed doxycycline 09/16/23 alternating between BiPAP and HFNC at 12 L/min -Cough and dyspnea persist  2--AKI----acute kidney injury on CKD stage -3B -Creatinine peaked at 2.42 Baseline Creatinine---0.9 to 1.1  -Creatinine improving with diuretic holiday -Nephrology consult appreciated -renally adjust medications, avoid nephrotoxic agents / dehydration  / hypotension  3-HTN -Continue Cardizem and bisoprolol--mostly for A-fib rate control  4-hypothyroidism -Continue Synthroid. -TSH within normal limits.  5-gastroesophageal flux disease -Continue Protonix  6-COPD exacerbation -alternating between BiPAP and HFNC at 12 L/min -Cough and dyspnea persist -Continue treatment with steroids and nebulizer management as mentioned above.  7-acute on chronic diastolic heart failure ---Echo from 09/10/2023 with EF of 65 to 70% , There is the interventricular septum is flattened in systole and diastole, consistent with right ventricular pressure and  volume overload -Diuretics on hold due to AKI -Cardiology consult appreciated  8-s/p  mechanical Aortic  heart valve replacement -Echo from 09/10/2023 shows-25 mm St. Jude mechanical valve present in the aortic position. Aortic valve mean gradient measures 5.0 mmHg.  -Continue Coumadin per pharmacy.  9-hyperlipidemia -Continue Crestor.  10-history of chronic atrial fibrillation -Continue Cardizem and bisoprolol for rate control C/n coumadin for stroke prophylaxis  11-Dysphagia -Speech Eval 09/11/23--pt has moderate aspiration risk -Rec Regular solids and thin Liquids  12)Lt Nipple Discharge/Lesion----prior mammogram and chest imaging study without definite evidence for breast cancer -Patient was scheduled for possible punch biopsy of the left nipple lesion due to concerns about possible Paget's disease of  the breast  13)Social/Ethics--- plan of care and CODE STATUS/advanced directives discussed with patient and patient's son Amy Hua previously -They request DNR DNI, No Limitations to treatment otherwise -Ongoing palliative care conversations at this time  Subjective:   alternating between BiPAP and HFNC at 12 L/min -Cough and dyspnea persist No fever  Or chills   Physical Exam: Vitals:   09/16/23 1156 09/16/23 1200 09/16/23 1300 09/16/23 1333  BP:  122/80    Pulse:  75 74   Resp:  (!) 24 (!) 24   Temp: (!) 97.3 F (36.3 C)     TempSrc: Oral     SpO2:  95% 97% 97%  Weight:      Height:       Physical Exam Gen:- Awake Alert, no with minimal extension/positional change  HEENT:- [place].AT, No sclera icterus Nose- [place] 12L/min--alternating with BiPAP Neck-Supple Neck,No JVD,.  Lungs-  No wheezing, fair air ovement bilaterally , sternotomy scar CV- S1, S2 normal, RRR, metallic valve click Abd-  +ve B.Sounds, Abd Soft, No tenderness,    Extremity/Skin: Chronic Venous Stasis, good pedal pulses  Psych-affect is flat, oriented x3 Neuro-generalized weakness no new focal deficits, no tremors Psychiatry: Judgement and insight appear normal. Mood & affect appropriate.   Family Communication: Son , Amy Hua is primary contact.  Disposition: SNF when medically stable Status is: Inpatient  Author: Shon Hale, MD 09/16/2023 2:57 PM  For on call review [email or web address].

## 2023-09-16 NOTE — Progress Notes (Signed)
Patient ID: [name], female   DOB: [birth year], 72 y.o.   MRN: [id number] S: Feeling a little better this morning. O:BP 127/89   Pulse 83   Temp (!) 97.5 F (36.4 C) (Oral)   Resp (!) 31   Ht 5\' 8"  (1.727 m)   Wt 61.8 kg   SpO2 93%   BMI 20.72 kg/m   Intake/Output Summary (Last 24 hours) at 09/16/2023 0939 Last data filed at 09/16/2023 0500 Gross per 24 hour  Intake 210 ml  Output 1180 ml  Net -970 ml   Intake/Output: I/O last 3 completed shifts: In: 510 [P.O.:510] Out: 1580 [Urine:1580]  Intake/Output this shift:  No intake/output data recorded. Weight change: -6.8 kg Gen: ill-appearing but in NAD CVS: IRR with metallic click, no rub Resp: CTA anteriorly.  Poor inspiratory effort Abd: +BS, soft, NT/nD Ext: no edema  Recent Labs  Lab 09/09/23 1215 09/10/23 0442 09/11/23 0502 09/12/23 0419 09/13/23 0405 09/14/23 0543 09/15/23 0349 09/16/23 0504  NA  --  133* 133* 133* 134* 133* 135 134*  K  --  3.8 4.4 4.3 4.8 4.5 4.5 4.2  CL  --  88* 87* 86* 88* 87* 89* 89*  CO2  --  36* 36* 34* 33* 31 34* 33*  GLUCOSE  --  135* 99 89 76 98 115* 120*  BUN  --  16 27* 35* 45* 58* 68* 64*  CREATININE  --  1.19* 1.70* 2.21* 2.42* 2.42* 2.29* 1.97*  ALBUMIN  --   --   --   --   --   --  3.5 3.7  CALCIUM  --  8.6* 8.9 8.7* 8.9 8.9 8.9 9.1  PHOS 3.5  --   --   --   --   --  4.5 3.2   Liver Function Tests: Recent Labs  Lab 09/15/23 0349 09/16/23 0504  ALBUMIN 3.5 3.7   No results for input(s): "LIPASE", "AMYLASE" in the last 168 hours. No results for input(s): "AMMONIA" in the last 168 hours. CBC: Recent Labs  Lab 09/09/23 0942 09/10/23 0442 09/14/23 0543  WBC 6.1 7.3 3.7*  NEUTROABS 5.3  --   --   HGB 10.4* 9.1* 9.7*  HCT 33.7* 30.7* 33.2*  MCV 91.1 91.6 92.5  PLT 191 203 204   Cardiac Enzymes: No results for input(s): "CKTOTAL", "CKMB", "CKMBINDEX", "TROPONINI" in the last 168 hours. CBG: Recent Labs  Lab 09/10/23 0736 09/10/23 2145  GLUCAP 115* 127*     Iron Studies: No results for input(s): "IRON", "TIBC", "TRANSFERRIN", "FERRITIN" in the last 72 hours. Studies/Results: US RENAL  Result Date: 09/14/2023 CLINICAL DATA:  72 year old female with acute renal insufficiency. EXAM: RENAL / URINARY TRACT ULTRASOUND COMPLETE COMPARISON:  Chest CT 09/09/2023. FINDINGS: Right Kidney: Renal measurements: 9.9 x 6.4 x 6.7 cm = volume: 222 mL. Renal cortical thinning and increased echogenicity (image 3). No right hydronephrosis or renal mass. Left Kidney: Renal measurements: 10.0 x 5.8 x 5.1 cm = volume: 152 mL. Renal cortical thinning and echogenicity (image 23). No left hydronephrosis or solid renal mass. Bladder: Appears normal for degree of bladder distention. No ureteral jets detected. Other: None. IMPRESSION: Chronic medical renal disease. No hydronephrosis or acute renal abnormality. Electronically Signed   By: Odessa Fleming M.D.   On: 09/14/2023 12:37    bisoprolol  5 mg Oral Daily   budesonide (PULMICORT) nebulizer solution  1 mg Nebulization BID   Chlorhexidine Gluconate Cloth  6 each Topical Daily   DermaCerin  Topical BID   diltiazem  120 mg Oral Daily   doxycycline  100 mg Oral Q12H   ipratropium  0.5 mg Nebulization Q6H   levalbuterol  0.63 mg Nebulization Q6H   levothyroxine  50 mcg Oral QAC breakfast   methylPREDNISolone (SOLU-MEDROL) injection  80 mg Intravenous Daily   mouth rinse  15 mL Mouth Rinse 4 times per day   pantoprazole  40 mg Oral Daily   rOPINIRole  3 mg Oral BID   rosuvastatin  10 mg Oral Daily   sodium chloride flush  3 mL Intravenous Q12H   Warfarin - Pharmacist Dosing Inpatient   Does not apply q1600    BMET    Component Value Date/Time   NA 134 (L) 09/16/2023 0504   K 4.2 09/16/2023 0504   CL 89 (L) 09/16/2023 0504   CO2 33 (H) 09/16/2023 0504   GLUCOSE 120 (H) 09/16/2023 0504   BUN 64 (H) 09/16/2023 0504   CREATININE 1.97 (H) 09/16/2023 0504   CREATININE 1.14 (H) 04/15/2013 1325   CALCIUM 9.1 09/16/2023 0504    GFRNONAA 27 (L) 09/16/2023 0504   GFRAA 32 (L) 05/30/2019 0509   CBC    Component Value Date/Time   WBC 3.7 (L) 09/14/2023 0543   RBC 3.59 (L) 09/14/2023 0543   HGB 9.7 (L) 09/14/2023 0543   HCT 33.2 (L) 09/14/2023 0543   PLT 204 09/14/2023 0543   MCV 92.5 09/14/2023 0543   MCH 27.0 09/14/2023 0543   MCHC 29.2 (L) 09/14/2023 0543   RDW 15.9 (H) 09/14/2023 0543   LYMPHSABS 0.5 (L) 09/09/2023 0942   MONOABS 0.2 09/09/2023 0942   EOSABS 0.0 09/09/2023 0942   BASOSABS 0.0 09/09/2023 0942   Assessment/Plan:  AKI/CKD stage IIIa - 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  Scr continues to slowly improve off of IV lasix.  Urine Na <10 so consistent with pre-renal insult and likely decreased effective intravascular volume +/- contrast.  Renal US without obstruction and consistent with chronic medical renal disease.  No indication for dialysis at this time.  Pt is a poor candidate for longterm dialysis given her multiple co-morbidities and poor functional and nutritional status. Avoid nephrotoxic medications including NSAIDs and iodinated intravenous contrast exposure unless the latter is absolutely indicated.   Preferred narcotic agents for pain control are hydromorphone, fentanyl, and methadone. Morphine should not be used.  Avoid Baclofen and avoid oral sodium phosphate and magnesium citrate based laxatives / bowel preps.  Continue strict Input and Output monitoring. Will monitor the patient closely with you and intervene or adjust therapy as indicated by changes in clinical status/labs  Acute on chronic  respiratory failure - hypoxia and hypercapnia.  Likely COPD exacerbation as well as CHF.  Currently on steroids, nebulizers, and abx per primary svc.  Bipap at bedtime. Acute on chronic diastolic CHF - initially diuresed but now on hold due to AKI. S/p mechanical AVR  - on coumadin HTN - stable DM - per primary Disposition - pt is DNR, ongoing  goals of care discussions.  Palliative care consult pending.  Irena Cords, MD [facility] [phone]  Pt will not physically be seen tomorrow due to the holiday but will be monitored remotely.  On call coverage available if needed.  She will be seen again on Friday.

## 2023-09-16 NOTE — Care Management Important Message (Signed)
Important Message  Patient Details  Name: [name] MRN: [id number] Date of Birth: November 17, 1950   Important Message Given:  Yes - Medicare IM     Corey Harold 09/16/2023, 12:16 PM

## 2023-09-16 NOTE — Progress Notes (Signed)
PT Cancellation Note  Patient Details Name: TOREY CHESTNUT MRN: [id number] DOB: 1951-07-31   Cancelled Treatment:    Reason Eval/Treat Not Completed: Medical issues which prohibited therapy.  Patient on hold due to continuing to have difficulty breathing while on high flo O2 per RN.   3:28 PM, 09/16/23 Ocie Bob, MPT Physical Therapist with [facility] [phone] office [phone] mobile phone

## 2023-09-16 NOTE — Progress Notes (Signed)
Palliative: [name] is lying quietly in bed.  She appears acutely/chronically ill and frail, older than stated age.  She is resting comfortably, but easily slowly wakes when I call her name.  She is able to tell me her name, but not that we are in the hospital.  I am not sure that she can make her needs known.  Her son, [name], is present at bedside along with respiratory therapy attending to needs.  Face-to-face discussion with bedside nursing staff patient condition, needs.  [name] and I talk about [name]'s acute and chronic health concerns.  He seems encouraged that she is showing some improvements.  We talked about time for outcomes.  We talked about her respiratory status, the use of BiPAP and oxygen levels.  I shared that it will likely become clear over the next few days [name]'s ability to have recovery.  At this point the goal is short-term rehab with the ultimate goal of returning home.  TOC is working with patient and family.    Conference with attending, bedside nursing staff, transition of care team related to patient condition, needs, goals of care, disposition.  Plan: Continue to treat the treatable but no CPR or intubation.  Time for outcomes.  Short-term rehab with ultimate goal of returning home.  Would benefit from outpatient palliative services, not discussed today.  50 minutes Lillia Carmel, NP Palliative medicine team Team phone [phone]

## 2023-09-16 NOTE — Plan of Care (Signed)
  Problem: Clinical Measurements: Goal: Ability to maintain clinical measurements within normal limits will improve Outcome: Progressing Goal: Cardiovascular complication will be avoided Outcome: Progressing   Problem: Coping: Goal: Level of anxiety will decrease Outcome: Progressing   Problem: Elimination: Goal: Will not experience complications related to urinary retention Outcome: Progressing   Problem: Pain Management: Goal: General experience of comfort will improve Outcome: Progressing   Problem: Safety: Goal: Ability to remain free from injury will improve Outcome: Progressing

## 2023-09-16 NOTE — Progress Notes (Signed)
Pt's wound measurements were due today. Assessed pt with MD at bedside. No open wounds noted on her left or right leg. MD took new pictures to add to the chart. Got the ok from MD to leave out measurements since no wound present, just generalized vascular disease with discoloring. Eucerin applied to bilateral legs.

## 2023-09-17 DIAGNOSIS — Z7189 Other specified counseling: Secondary | ICD-10-CM | POA: Diagnosis not present

## 2023-09-17 DIAGNOSIS — J9621 Acute and chronic respiratory failure with hypoxia: Secondary | ICD-10-CM | POA: Diagnosis not present

## 2023-09-17 DIAGNOSIS — Z515 Encounter for palliative care: Secondary | ICD-10-CM | POA: Diagnosis not present

## 2023-09-17 LAB — CBC
HCT: 29 % — ABNORMAL LOW (ref 36.0–46.0)
Hemoglobin: 8.6 g/dL — ABNORMAL LOW (ref 12.0–15.0)
MCH: 27.1 pg (ref 26.0–34.0)
MCHC: 29.7 g/dL — ABNORMAL LOW (ref 30.0–36.0)
MCV: 91.5 fL (ref 80.0–100.0)
Platelets: 161 10*3/uL (ref 150–400)
RBC: 3.17 MIL/uL — ABNORMAL LOW (ref 3.87–5.11)
RDW: 16.3 % — ABNORMAL HIGH (ref 11.5–15.5)
WBC: 4.1 10*3/uL (ref 4.0–10.5)
nRBC: 0 % (ref 0.0–0.2)

## 2023-09-17 LAB — PROTIME-INR
INR: 1.4 — ABNORMAL HIGH (ref 0.8–1.2)
Prothrombin Time: 17.7 s — ABNORMAL HIGH (ref 11.4–15.2)

## 2023-09-17 LAB — RENAL FUNCTION PANEL
Albumin: 3.4 g/dL — ABNORMAL LOW (ref 3.5–5.0)
Anion gap: 9 (ref 5–15)
BUN: 57 mg/dL — ABNORMAL HIGH (ref 8–23)
CO2: 37 mmol/L — ABNORMAL HIGH (ref 22–32)
Calcium: 9.1 mg/dL (ref 8.9–10.3)
Chloride: 91 mmol/L — ABNORMAL LOW (ref 98–111)
Creatinine, Ser: 1.71 mg/dL — ABNORMAL HIGH (ref 0.44–1.00)
GFR, Estimated: 32 mL/min — ABNORMAL LOW (ref >60)
Glucose, Bld: 115 mg/dL — ABNORMAL HIGH (ref 70–99)
Phosphorus: 2.7 mg/dL (ref 2.5–4.6)
Potassium: 4.3 mmol/L (ref 3.5–5.1)
Sodium: 137 mmol/L (ref 135–145)

## 2023-09-17 MED ORDER — WARFARIN SODIUM 2.5 MG PO TABS
2.5000 mg | ORAL_TABLET | Freq: Once | ORAL | Status: AC
  Administered 2023-09-17: 2.5 mg via ORAL
  Filled 2023-09-17: qty 1

## 2023-09-17 NOTE — Progress Notes (Signed)
Progress Note   Patient: [name] MVH:[id number] DOB: [birth year] DOA: 09/09/2023     8 DOS: the patient was seen and examined on 09/17/2023   Brief hospital course: JUHI TIEU is a 72 y.o. female with medical history significant of chronic diastolic heart failure, atrial fibrillation, history of aortic valve replacement (mechanical valve) chronically on Coumadin, history of COPD, chronic respiratory failure (on 2 L at baseline), gastroesophageal reflux disease, hypothyroidism, Paget's disease and restless leg syndrome; who presented to the hospital secondary to worsening shortness of breath.  Symptoms has been present for the last 3-4 days and worsening.  Family noticed decrease in care station capacity due to shortness of breath and also increased requirement for oxygen supplementation.  At home bronchodilator management has failed to improve symptoms.   Patient reports dry coughing spells, increased wheezing and decreased appetite.  No fever, no chest pain, no abdominal pain, no dysuria, no hematuria, no sick contacts, no focal weaknesses or any other complaints.   Per family at bedside patient actively seeing for positive left nipple changes and breast discharge.   Nebulizer management, IV antibiotics and IV Lasix initiated at presentation.  COVID PCR negative.   CT chest: Demonstrating moderate cardiomegaly, enlarged pulmonary arteries suggesting pulmonary hypertension and the presence of mild bilateral pleural effusion along with atelectasis/bronchitic changes on the adjacent lower lobes.  Mild bilateral pulmonary edema appreciated.  Assessment and Plan: 1-acute on chronic respiratory failure with hypoxia and hypercapnia -Appears to be multifactorial in the setting of COPD exacerbation and CHF exacerbation -Hypoxia persist requiring high flow oxygen at 12 L alternating with BiPAP -ABG with hypercapnia and uncompensated respiratory acidosis despite BiPAP use -At baseline usually  uses 3 and half liters of oxygen via nasal cannula prior to admission 09/17/23 alternating between BiPAP and HFNC at 11 L/min -Cough and dyspnea is Not worse -Continue IV Solu-Medrol, bronchodilators  -Completed doxycycline Repeat chest x-ray in a.m.  2--AKI----acute kidney injury on CKD stage -3B -Creatinine peaked at 2.42 Baseline Creatinine---0.9 to 1.1  -Creatinine improving with diuretic holiday -Nephrology consult appreciated -renally adjust medications, avoid nephrotoxic agents / dehydration  / hypotension  3-HTN -Continue Cardizem and bisoprolol--mostly for A-fib rate control  4-hypothyroidism -Continue Synthroid. -TSH within normal limits.  5-gastroesophageal flux disease -Continue Protonix  6-COPD exacerbation -alternating between BiPAP and HFNC at 11 L/min -See #1 above  7-acute on chronic diastolic heart failure ---Echo from 09/10/2023 with EF of 65 to 70% , There is the interventricular septum is flattened in systole and diastole, consistent with right ventricular pressure and  volume overload -Diuretics on hold due to AKI -Cardiology consult appreciated  8-s/p  mechanical Aortic  heart valve replacement -Echo from 09/10/2023 shows-25 mm St. Jude mechanical valve present in the aortic position. Aortic valve mean gradient measures 5.0 mmHg.  -Continue Coumadin per pharmacy.  9-hyperlipidemia -Continue Crestor.  10-history of chronic atrial fibrillation -Continue Cardizem and bisoprolol for rate control C/n coumadin for stroke prophylaxis  11-Dysphagia -Speech Eval 09/11/23--pt has moderate aspiration risk -Rec Regular solids and thin Liquids  12)Lt Nipple Discharge/Lesion----prior mammogram and chest imaging study without definite evidence for breast cancer -Patient was scheduled for possible punch biopsy of the left nipple lesion due to concerns about possible Paget's disease of the breast  13)Social/Ethics--- plan of care and CODE STATUS/advanced  directives discussed with patient and patient's son [name] previously -They request DNR DNI, No Limitations to treatment otherwise -Ongoing palliative care conversations at this time  Subjective:   alternating between  BiPAP and HFNC at 11 L/min -Cough and dyspnea persist -More awake, more conversational  Physical Exam: Vitals:   09/17/23 1326 09/17/23 1331 09/17/23 1400 09/17/23 1500  BP: 125/76  120/78 115/79  Pulse: 86  81 76  Resp: (!) 32  (!) 30 (!) 34  Temp:      TempSrc:      SpO2: 93% 92% 91% 93%  Weight:      Height:       Physical Exam Gen:- Awake Alert, dyspnea with minimal exertion  HEENT:- [place].AT, No sclera icterus Nose- B and E 11L/min--alternating with BiPAP Neck-Supple Neck,No JVD,.  Lungs-  No wheezing, fair air ovement bilaterally , sternotomy scar CV- S1, S2 normal, RRR, metallic valve click Abd-  +ve B.Sounds, Abd Soft, No tenderness,    Extremity/Skin: Chronic Venous Stasis, good pedal pulses  Psych-affect is flat, oriented x3 Neuro-generalized weakness no new focal deficits, no tremors Psychiatry: Judgement and insight appear normal. Mood & affect appropriate.   Family Communication: Son , [name] is primary contact.  Disposition: SNF when medically stable Status is: Inpatient  Author: Shon Hale, MD 09/17/2023 3:25 PM  For on call review [email or web address].

## 2023-09-17 NOTE — Progress Notes (Signed)
PHARMACY - ANTICOAGULATION CONSULT NOTE  Pharmacy Consult for Coumadin Indication:  aortic mechanical valve    Allergies  Allergen Reactions   Penicillin G Itching and Other (See Comments)   Penicillins Hives, Itching, Swelling and Rash    Patient Measurements: Height: 5\' 8"  (172.7 cm) Weight: 66.9 kg (147 lb 7.8 oz) IBW/kg (Calculated) : 63.9   Vital Signs: Temp: 97.7 F (36.5 C) (11/28 0800) Temp Source: Oral (11/28 0800) BP: 123/76 (11/28 0900) Pulse Rate: 79 (11/28 0900)  Labs: Recent Labs    09/15/23 0349 09/16/23 0504 09/17/23 0458  HGB  --   --  8.6*  HCT  --   --  29.0*  PLT  --   --  161  LABPROT 55.5* 18.3* 17.7*  INR 6.3* 1.5* 1.4*  CREATININE 2.29* 1.97* 1.71*    Estimated Creatinine Clearance: 30.4 mL/min (A) (by C-G formula based on SCr of 1.71 mg/dL (H)).   Medical History: Past Medical History:  Diagnosis Date   Anxiety disorder    Aortic stenosis    Status post St. Jude mechanical AVR 2007   Asthma    Atrial fibrillation (HCC)    Carcinoid tumor of colon 2007   Chronic diastolic heart failure (HCC)    Coronary atherosclerosis of native coronary artery    Status post CABG 2007   GERD (gastroesophageal reflux disease)    History of colonoscopy 2003   Dr. 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 Jena Gauss, benign biopsies   RLS (restless legs syndrome)    Schatzki's ring    Assessment: Patient presented with SOB. Pharmacy consulted to dose warfarin in patient with atrial fibrillation and mechanical AVR. Patient's warfarin home dose listed as 4 mg on Tuesday and 3 mg ROW.   INR appears very labile and also patient's diet appears to be poor increasing warfarin sensitivity.   INR 6.3 >> 1.5> 1.4 due to Vitamin K 5 mg IV given on 11/26. Will likely take a few days to get back to therapeutic due to this.  Goal of Therapy:  INR 2-3 Monitor platelets by  anticoagulation protocol: Yes   Plan:  Warfarin 2.5 mg x 1 dose- lower than home dosing since supratherapeutic recently  Daily PT-INR Monitor for S/S of bleeding  Judeth Cornfield, PharmD Clinical Pharmacist 09/17/2023 9:18 AM

## 2023-09-17 NOTE — Progress Notes (Signed)
Palliative:  [name] is sitting up in bed with her lunch tray in front of her.  She continues to appear acutely/chronically ill and frail.  She will make but not keep eye contact.  She is alert, able to make her needs known.  Her son/HCPOA, [name], is present at bedside.  We talk about her acute health concerns.  [name] shares that she is eating and drinking more.  He feels that she is improving slowly.  We talked about time for outcomes.  At this point the goal remains for short-term rehab with ultimate goal of returning home.  Face-to-face with bedside nursing staff and transition of care team related to patient condition, needs, goals of care.  Plan: Continue to treat the treatable but no CPR or intubation.  Time for outcomes.  Short-term rehab with ultimate goal of returning home.  Would benefit from outpatient palliative services which have been discussed.  35 minutes  Lillia Carmel, NP Palliative medicine team Team phone [phone]

## 2023-09-18 ENCOUNTER — Inpatient Hospital Stay (HOSPITAL_COMMUNITY): Payer: Medicare Other

## 2023-09-18 DIAGNOSIS — J9621 Acute and chronic respiratory failure with hypoxia: Secondary | ICD-10-CM | POA: Diagnosis not present

## 2023-09-18 LAB — RENAL FUNCTION PANEL
Albumin: 3.4 g/dL — ABNORMAL LOW (ref 3.5–5.0)
Anion gap: 8 (ref 5–15)
BUN: 58 mg/dL — ABNORMAL HIGH (ref 8–23)
CO2: 37 mmol/L — ABNORMAL HIGH (ref 22–32)
Calcium: 9.1 mg/dL (ref 8.9–10.3)
Chloride: 92 mmol/L — ABNORMAL LOW (ref 98–111)
Creatinine, Ser: 1.55 mg/dL — ABNORMAL HIGH (ref 0.44–1.00)
GFR, Estimated: 36 mL/min — ABNORMAL LOW (ref >60)
Glucose, Bld: 119 mg/dL — ABNORMAL HIGH (ref 70–99)
Phosphorus: 2.6 mg/dL (ref 2.5–4.6)
Potassium: 4.3 mmol/L (ref 3.5–5.1)
Sodium: 137 mmol/L (ref 135–145)

## 2023-09-18 LAB — PROTIME-INR
INR: 1.9 — ABNORMAL HIGH (ref 0.8–1.2)
Prothrombin Time: 21.7 s — ABNORMAL HIGH (ref 11.4–15.2)

## 2023-09-18 MED ORDER — FUROSEMIDE 10 MG/ML IJ SOLN: 40.0000 mg | Freq: Two times a day (BID) | INTRAMUSCULAR | Status: DC

## 2023-09-18 MED ORDER — FUROSEMIDE 10 MG/ML IJ SOLN
40.0000 mg | Freq: Once | INTRAMUSCULAR | Status: AC
  Administered 2023-09-18: 40 mg via INTRAVENOUS
  Filled 2023-09-18: qty 4

## 2023-09-18 MED ORDER — WARFARIN SODIUM 2.5 MG PO TABS
2.5000 mg | ORAL_TABLET | Freq: Once | ORAL | Status: AC
  Administered 2023-09-18: 2.5 mg via ORAL
  Filled 2023-09-18: qty 1

## 2023-09-18 MED ORDER — FUROSEMIDE 10 MG/ML IJ SOLN
40.0000 mg | Freq: Every day | INTRAMUSCULAR | Status: DC
  Administered 2023-09-18 – 2023-09-20 (×3): 40 mg via INTRAVENOUS
  Filled 2023-09-18 (×3): qty 4

## 2023-09-18 NOTE — Progress Notes (Signed)
   09/18/23 1150  Spiritual Encounters  Type of Visit Initial  Care provided to: Patient  OnCall Visit No   Chaplain visited with patient during rounding of the unit. The patient [name] struggled to talk so I kept my visit short. I shared a prayer with her. She thanked me for stopping by.   Valerie Roys [facility]  [phone]

## 2023-09-18 NOTE — Plan of Care (Signed)

## 2023-09-18 NOTE — Progress Notes (Addendum)
PHARMACY - ANTICOAGULATION CONSULT NOTE  Pharmacy Consult for Coumadin Indication:  aortic mechanical valve    Allergies  Allergen Reactions   Penicillin G Itching and Other (See Comments)   Penicillins Hives, Itching, Swelling and Rash    Patient Measurements: Height: 5\' 8"  (172.7 cm) Weight: 66.9 kg (147 lb 7.8 oz) IBW/kg (Calculated) : 63.9   Vital Signs: Temp: 97.3 F (36.3 C) (11/29 0741) Temp Source: Axillary (11/29 0741) BP: 123/90 (11/29 1000) Pulse Rate: 90 (11/29 1000)  Labs: Recent Labs    09/16/23 0504 09/17/23 0458 09/18/23 0444  HGB  --  8.6*  --   HCT  --  29.0*  --   PLT  --  161  --   LABPROT 18.3* 17.7* 21.7*  INR 1.5* 1.4* 1.9*  CREATININE 1.97* 1.71* 1.55*    Estimated Creatinine Clearance: 33.6 mL/min (A) (by C-G formula based on SCr of 1.55 mg/dL (H)).   Medical History: Past Medical History:  Diagnosis Date   Anxiety disorder    Aortic stenosis    Status post St. Jude mechanical AVR 2007   Asthma    Atrial fibrillation (HCC)    Carcinoid tumor of colon 2007   Chronic diastolic heart failure (HCC)    Coronary atherosclerosis of native coronary artery    Status post CABG 2007   GERD (gastroesophageal reflux disease)    History of colonoscopy 2003   Dr. 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 Jena Gauss, benign biopsies   RLS (restless legs syndrome)    Schatzki's ring    Assessment: Patient presented with SOB. Pharmacy consulted to dose warfarin in patient with atrial fibrillation and mechanical AVR. Patient's warfarin home dose listed as 4 mg on Tuesday and 3 mg ROW.   INR appears very labile and also patient's diet appears to be poor, thus increasing warfarin sensitivity.   INR 6.3 >> 1.5> 1.4 due to Vitamin K 5 mg IV given on 11/26. Will likely take a few days to get back to therapeutic due to this. On 11/28, INR is continuing to trend toward  therapeutic but oral intake remains low, albeit improving slowly.  Goal of Therapy:  INR 2-3 Monitor platelets by anticoagulation protocol: Yes   Plan: Warfarin 2.5 mg x 1 again today given low oral intake persists Daily INR Continue to monitor for signs and symptoms of bleeding   Will M. Dareen Piano, PharmD Clinical Pharmacist 09/18/2023 10:21 AM

## 2023-09-18 NOTE — Progress Notes (Signed)
Physical Therapy Treatment Patient Details Name: [name] MRN: [id number] DOB: 11/26/1950 Today's Date: 09/18/2023   History of Present Illness [name] is a 72 y.o. female with medical history significant of chronic diastolic heart failure, atrial fibrillation, history of aortic valve replacement (mechanical valve) chronically on Coumadin, history of COPD, chronic respiratory failure (on 2 L at baseline), gastroesophageal reflux disease, hypothyroidism, Paget's disease and restless leg syndrome; who presented to the hospital secondary to worsening shortness of breath.  Symptoms has been present for the last 3-4 days and worsening.  Family noticed decrease in care station capacity due to shortness of breath and also increased requirement for oxygen supplementation.  At home bronchodilator management has failed to improve symptoms.    PT Comments  Patient demonstrates slow labored movement for sitting up at bedside, once seated had mild posterior leaning that resolved after 2-3 minutes, able to stand and take a few steps, but limited mostly due to fatigue, difficulty breathing with SpO2 dropping from 94% to 84% while on 5 LPM and had to raise to 8 LPM to keep SpO2 above 90% during functional activities - nurse notified.  Patient tolerated sitting up in chair after therapy.  Patient will benefit from continued skilled physical therapy in hospital and recommended venue below to increase strength, balance, endurance for safe ADLs and gait.       If plan is discharge home, recommend the following: A little help with walking and/or transfers;A little help with bathing/dressing/bathroom;Help with stairs or ramp for entrance;Assistance with cooking/housework   Can travel by private vehicle     Yes  Equipment Recommendations  None recommended by PT    Recommendations for Other Services       Precautions / Restrictions Precautions Precautions: Fall Restrictions Weight Bearing  Restrictions: No     Mobility  Bed Mobility Overal bed mobility: Needs Assistance Bed Mobility: Supine to Sit     Supine to sit: Min assist, Mod assist     General bed mobility comments: labored movement requring frequent verbal/tactile cueing    Transfers Overall transfer level: Needs assistance Equipment used: Rolling walker (2 wheels) Transfers: Sit to/from Stand, Bed to chair/wheelchair/BSC Sit to Stand: Min assist   Step pivot transfers: Min assist, Mod assist       General transfer comment: unsteady labored movement    Ambulation/Gait Ambulation/Gait assistance: Mod assist Gait Distance (Feet): 5 Feet Assistive device: Rolling walker (2 wheels) Gait Pattern/deviations: Decreased step length - right, Decreased step length - left, Decreased stride length Gait velocity: slow     General Gait Details: limited to a few slow labored side steps at bedside before requesting to sit due to fatigue   Stairs             Wheelchair Mobility     Tilt Bed    Modified Rankin (Stroke Patients Only)       Balance Overall balance assessment: Needs assistance Sitting-balance support: Feet supported, No upper extremity supported Sitting balance-Leahy Scale: Fair Sitting balance - Comments: fair/good seated at EOB   Standing balance support: Reliant on assistive device for balance, During functional activity, Bilateral upper extremity supported Standing balance-Leahy Scale: Poor Standing balance comment: fair/poor using RW                            Cognition Arousal: Alert Behavior During Therapy: WFL for tasks assessed/performed, Anxious Overall Cognitive Status: Within Functional Limits for tasks assessed  Exercises      General Comments        Pertinent Vitals/Pain Pain Assessment Pain Assessment: No/denies pain    Home Living                          Prior  Function            PT Goals (current goals can now be found in the care plan section) Acute Rehab PT Goals Patient Stated Goal: return home with family to assist PT Goal Formulation: With patient Time For Goal Achievement: 09/24/23 Potential to Achieve Goals: Good Progress towards PT goals: Progressing toward goals    Frequency    Min 3X/week      PT Plan      Co-evaluation              AM-PAC PT "6 Clicks" Mobility   Outcome Measure  Help needed turning from your back to your side while in a flat bed without using bedrails?: A Little Help needed moving from lying on your back to sitting on the side of a flat bed without using bedrails?: A Little Help needed moving to and from a bed to a chair (including a wheelchair)?: A Lot Help needed standing up from a chair using your arms (e.g., wheelchair or bedside chair)?: A Lot Help needed to walk in hospital room?: A Lot Help needed climbing 3-5 steps with a railing? : A Lot 6 Click Score: 14    End of Session Equipment Utilized During Treatment: Oxygen Activity Tolerance: Patient tolerated treatment well;Patient limited by fatigue Patient left: in chair;with call bell/phone within reach Nurse Communication: Mobility status PT Visit Diagnosis: Unsteadiness on feet (R26.81);Other abnormalities of gait and mobility (R26.89);Muscle weakness (generalized) (M62.81)     Time: 1610-9604 PT Time Calculation (min) (ACUTE ONLY): 32 min  Charges:    $Therapeutic Activity: 23-37 mins PT General Charges $$ ACUTE PT VISIT: 1 Visit                     12:35 PM, 09/18/23 Ocie Bob, MPT Physical Therapist with [facility] [phone] office [phone] mobile phone

## 2023-09-18 NOTE — Progress Notes (Signed)
Patient ID: [name], female   DOB: July 05, 1951, 72 y.o.   MRN: [id number]   Subjective:  She had 1.1 liters UOP over 11/28 as well as one unmeasured urine void.  She has been ordered for lasix 40 mg IV daily.  She was seen in follow-up by palliative care yesterday and the goal remains as short term rehab with the ultimate goal of returning home.  Spoke with her RN who states that her breathing has gotten better over the course of the morning.   Review of systems:  She reports some shortness of breath Denies chest pain  She denies nausea/vomiting    O:BP (!) 123/90   Pulse 90   Temp (!) 97.3 F (36.3 C) (Axillary)   Resp 19   Ht 5\' 8"  (1.727 m)   Wt 66.9 kg   SpO2 91%   BMI 22.43 kg/m   Intake/Output Summary (Last 24 hours) at 09/18/2023 1142 Last data filed at 09/18/2023 0600 Gross per 24 hour  Intake 960 ml  Output 1050 ml  Net -90 ml   Intake/Output: I/O last 3 completed shifts: In: 960 [P.O.:960] Out: 1500 [Urine:1500]  Intake/Output this shift:  No intake/output data recorded. Weight change:   General chronically ill appearing elderly female in bed on 6 liters of oxygen HEENT normocephalic atraumatic extraocular movements intact sclera anicteric Neck supple trachea midline Lungs clear but reduced on auscultation; tachypneic Heart mechanical valve; regular rate Abdomen soft nontender nondistended Extremities 1+ edema  Psych no anxiety or agitation  Neuro - decreased hearing. Oriented to person. Year is 2025 and she states were are at [facility] (this is [facility])   Recent Labs  Lab 09/12/23 0419 09/13/23 0405 09/14/23 0543 09/15/23 0349 09/16/23 0504 09/17/23 0458 09/18/23 0444  NA 133* 134* 133* 135 134* 137 137  K 4.3 4.8 4.5 4.5 4.2 4.3 4.3  CL 86* 88* 87* 89* 89* 91* 92*  CO2 34* 33* 31 34* 33* 37* 37*  GLUCOSE 89 76 98 115* 120* 115* 119*  BUN 35* 45* 58* 68* 64* 57* 58*  CREATININE 2.21* 2.42* 2.42* 2.29* 1.97* 1.71* 1.55*  ALBUMIN  --   --   --   3.5 3.7 3.4* 3.4*  CALCIUM 8.7* 8.9 8.9 8.9 9.1 9.1 9.1  PHOS  --   --   --  4.5 3.2 2.7 2.6   Liver Function Tests: Recent Labs  Lab 09/16/23 0504 09/17/23 0458 09/18/23 0444  ALBUMIN 3.7 3.4* 3.4*   No results for input(s): "LIPASE", "AMYLASE" in the last 168 hours. No results for input(s): "AMMONIA" in the last 168 hours. CBC: Recent Labs  Lab 09/14/23 0543 09/17/23 0458  WBC 3.7* 4.1  HGB 9.7* 8.6*  HCT 33.2* 29.0*  MCV 92.5 91.5  PLT 204 161   Cardiac Enzymes: No results for input(s): "CKTOTAL", "CKMB", "CKMBINDEX", "TROPONINI" in the last 168 hours. CBG: No results for input(s): "GLUCAP" in the last 168 hours.   Iron Studies: No results for input(s): "IRON", "TIBC", "TRANSFERRIN", "FERRITIN" in the last 72 hours. Studies/Results: DG CHEST PORT 1 VIEW  Result Date: 09/18/2023 CLINICAL DATA:  [id number] Dyspnea and respiratory abnormalities [id number] EXAM: PORTABLE CHEST 1 VIEW COMPARISON:  09/14/2023. FINDINGS: Redemonstration of increased interstitial markings, essentially similar to the prior study. There are probable bilateral layering pleural effusions. No significant interval change. No pneumothorax. There are bibasilar retrocardiac opacities partially obscuring the hemidiaphragms, which may represent combination of atelectasis and/or consolidation. Stable moderately enlarged cardio-mediastinal silhouette. No acute osseous  abnormalities. The soft tissues are within normal limits. IMPRESSION: *Persistent mild-to-moderate congestive heart failure/pulmonary edema. Electronically Signed   By: Jules Schick M.D.   On: 09/18/2023 08:15    bisoprolol  5 mg Oral Daily   budesonide (PULMICORT) nebulizer solution  1 mg Nebulization BID   Chlorhexidine Gluconate Cloth  6 each Topical Daily   DermaCerin   Topical BID   diltiazem  120 mg Oral Daily   feeding supplement  237 mL Oral BID BM   furosemide  40 mg Intravenous Daily   ipratropium  0.5 mg Nebulization Q6H   levalbuterol   0.63 mg Nebulization Q6H   levothyroxine  50 mcg Oral QAC breakfast   methylPREDNISolone (SOLU-MEDROL) injection  80 mg Intravenous Daily   mouth rinse  15 mL Mouth Rinse 4 times per day   pantoprazole  40 mg Oral Daily   polyethylene glycol  17 g Oral Daily   rOPINIRole  3 mg Oral BID   rosuvastatin  10 mg Oral Daily   senna-docusate  2 tablet Oral QHS   sodium chloride flush  3 mL Intravenous Q12H   warfarin  2.5 mg Oral ONCE-1600   Warfarin - Pharmacist Dosing Inpatient   Does not apply q1600    BMET    Component Value Date/Time   NA 137 09/18/2023 0444   K 4.3 09/18/2023 0444   CL 92 (L) 09/18/2023 0444   CO2 37 (H) 09/18/2023 0444   GLUCOSE 119 (H) 09/18/2023 0444   BUN 58 (H) 09/18/2023 0444   CREATININE 1.55 (H) 09/18/2023 0444   CREATININE 1.14 (H) 04/15/2013 1325   CALCIUM 9.1 09/18/2023 0444   GFRNONAA 36 (L) 09/18/2023 0444   GFRAA 32 (L) 05/30/2019 0509   CBC    Component Value Date/Time   WBC 4.1 09/17/2023 0458   RBC 3.17 (L) 09/17/2023 0458   HGB 8.6 (L) 09/17/2023 0458   HCT 29.0 (L) 09/17/2023 0458   PLT 161 09/17/2023 0458   MCV 91.5 09/17/2023 0458   MCH 27.1 09/17/2023 0458   MCHC 29.7 (L) 09/17/2023 0458   RDW 16.3 (H) 09/17/2023 0458   LYMPHSABS 0.5 (L) 09/09/2023 0942   MONOABS 0.2 09/09/2023 0942   EOSABS 0.0 09/09/2023 0942   BASOSABS 0.0 09/09/2023 0942    Assessment/Plan:  AKI/CKD stage IIIa - 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  Slowly resolving with supportive care and holding diuretic.  Urine Na <10 so consistent with pre-renal insult and likely decreased effective intravascular volume +/- contrast.  Renal US without obstruction and consistent with chronic medical renal disease.  Patient is a poor candidate for longterm dialysis given her multiple co-morbidities and poor functional and nutritional status. Strict ins/outs  Palliative care is following and thankfully renal function is  improving    Acute on chronic  respiratory failure - hypoxia and hypercapnia.  Likely COPD exacerbation as well as CHF.  Steroids, nebulizers, and antibiotics per primary team.  Bipap at bedtime. Acute on chronic diastolic CHF - she has been started back on lasix IV daily.  Will give additional dose this afternoon, as well   S/p mechanical AVR  - on coumadin per primary team  Atrial fibrillation - agents per primary team. On dilt, bisoprolol, and anticoagulation HTN - acceptable control  DM - per primary team Disposition - pt is DNR, ongoing goals of care discussions.  Palliative care has seen her and they recommend continued outpatient palliative care involvement   Nephrology will  review her labs and chart over the weekend.  She will not be physically seen.  Please reach out to nephrology on call with any questions or if the patient's status changes  Estanislado Emms, MD 12:00 PM 09/18/2023

## 2023-09-18 NOTE — TOC Progression Note (Signed)
Transition of Care [facility]) - Progression Note    Patient Details  Name: [name] MRN: [id number] Date of Birth: 29-May-1951  [facility] CM/SW Contact  Armanda Heritage, RN Phone Number: 09/18/2023, 12:10 PM  Clinical Narrative:    CM noted [facility] has not made a bed offer at this time.  Spoke with Destiny in admissions and asked to review, awaiting determination.    Expected Discharge Plan: Home w Home Health Services Barriers to Discharge: Continued Medical Work up  Expected Discharge Plan and Services In-house Referral: Clinical Social Work Discharge Planning Services: CM Consult Post Acute Care Choice: Home Health Living arrangements for the past 2 months: Single Family Home                           HH Arranged: RN, PT HH Agency: [facility] Date [facility] Agency Contacted: 09/10/23   Representative spoke with at [facility] Agency: Maralyn Sago   Social Determinants of Health (SDOH) Interventions SDOH Screenings   Food Insecurity: No Food Insecurity (09/09/2023)  Housing: Low Risk  (09/09/2023)  Transportation Needs: No Transportation Needs (09/09/2023)  Utilities: Not At Risk (09/09/2023)  Financial Resource Strain: Low Risk  (07/28/2018)  Physical Activity: Inactive (07/28/2018)  Social Connections: Somewhat Isolated (07/28/2018)  Stress: No Stress Concern Present (07/28/2018)  Tobacco Use: Medium Risk (09/15/2023)  Health Literacy: Medium Risk (07/01/2023)   Received from [facility] Care    Readmission Risk Interventions    09/10/2023    2:02 PM  Readmission Risk Prevention Plan  Transportation Screening Complete  HRI or Home Care Consult Complete  Social Work Consult for Recovery Care Planning/Counseling Complete  Palliative Care Screening Not Applicable  Medication Review [employer]) Complete

## 2023-09-18 NOTE — Progress Notes (Signed)
Progress Note  Patient: [name] AVW:[id number] DOB: April 16, 1951 DOA: 09/09/2023     9 DOS: the patient was seen and examined on 09/18/2023   Brief hospital course: [name] is a 72 y.o. female with medical history significant of chronic diastolic heart failure, atrial fibrillation, history of aortic valve replacement (mechanical valve) chronically on Coumadin, history of COPD, chronic respiratory failure (on 2 L at baseline), gastroesophageal reflux disease, hypothyroidism, Paget's disease and restless leg syndrome; who presented to the hospital secondary to worsening shortness of breath.  Symptoms has been present for the last 3-4 days and worsening.  Family noticed decrease in care station capacity due to shortness of breath and also increased requirement for oxygen supplementation.  At home bronchodilator management has failed to improve symptoms.   Patient reports dry coughing spells, increased wheezing and decreased appetite.  No fever, no chest pain, no abdominal pain, no dysuria, no hematuria, no sick contacts, no focal weaknesses or any other complaints.   Per family at bedside patient actively seeing for positive left nipple changes and breast discharge.   Nebulizer management, IV antibiotics and IV Lasix initiated at presentation.  COVID PCR negative.   CT chest: Demonstrating moderate cardiomegaly, enlarged pulmonary arteries suggesting pulmonary hypertension and the presence of mild bilateral pleural effusion along with atelectasis/bronchitic changes on the adjacent lower lobes.  Mild bilateral pulmonary edema appreciated.  Assessment and Plan: 1-Acute on chronic respiratory failure with hypoxia and hypercapnia -Appears to be multifactorial in the setting of COPD exacerbation and CHF exacerbation -Hypoxia persist requiring high flow oxygen at 12 L alternating with BiPAP -ABG with hypercapnia and uncompensated respiratory acidosis despite BiPAP use -At baseline usually  uses 3 and half liters of oxygen via nasal cannula prior to admission 09/18/23 -Hypoxia improving slowly currently weaned down to 8 L of oxygen via nasal cannula -Continue to use BiPAP nightly -Cough and dyspnea proving slowly -Continue IV Solu-Medrol, bronchodilators  -Completed doxycycline Repeat chest x-ray on 09/18/2023 with congestive heart failure/pulm edema -Restart IV Lasix 40 mg daily on 09/18/2023 -Monitor I's and O's and daily weights REds Vest  2--AKI----acute kidney injury on CKD stage -3B -Creatinine peaked at 2.42 Baseline Creatinine---0.9 to 1.1  -Creatinine improved down to 1.55 with diuretic holiday -Monitor closely as we are restarting IV Lasix on 09/18/2023 -Nephrology consult appreciated -renally adjust medications, avoid nephrotoxic agents / dehydration  / hypotension  3-HTN -Continue Cardizem and bisoprolol--mostly for A-fib rate control  4-hypothyroidism -Continue Synthroid. -TSH within normal limits.  5-Gastroesophageal flux disease -Continue Protonix  6-COPD exacerbation -alternating between BiPAP and HFNC at 11 L/min -See #1 above  7-acute on chronic diastolic heart failure ---Echo from 09/10/2023 with EF of 65 to 70% , There is the interventricular septum is flattened in systole and diastole, consistent with right ventricular pressure and  volume overload -Repeat chest x-ray on 09/18/2023 with congestive heart failure/pulm edema -Restart IV Lasix 40 mg daily on 09/18/2023 -Monitor I's and O's and daily weights REds Vest -Cardiology consult appreciated  8-s/p  mechanical Aortic  heart valve replacement -Echo from 09/10/2023 shows-25 mm St. Jude mechanical valve present in the aortic position. Aortic valve mean gradient measures 5.0 mmHg.  -Continue Coumadin per pharmacy.  9-hyperlipidemia -Continue Crestor.  10-history of chronic atrial fibrillation -Continue Cardizem and bisoprolol for rate control C/n coumadin for stroke  prophylaxis  11-Dysphagia -Speech Eval 09/11/23--pt has moderate aspiration risk -Rec Regular solids and thin Liquids  12)Lt Nipple Discharge/Lesion----prior mammogram and chest imaging study without definite  evidence for breast cancer -Patient was scheduled for possible punch biopsy of the left nipple lesion due to concerns about possible Paget's disease of the breast  13)Social/Ethics--- plan of care and CODE STATUS/advanced directives discussed with patient and patient's son Amy Hua previously -They request DNR DNI, No Limitations to treatment otherwise -Ongoing palliative care conversations at this time  Subjective:  -Patient is sitting up in the chair after physical therapy got out of -Cough and dyspnea improving -Appetite is not great -I called and updated patient's son by phone this morning, questions answered  Physical Exam: Vitals:   09/18/23 0400 09/18/23 0600 09/18/23 0741 09/18/23 0821  BP: 127/69 134/75  127/76  Pulse: 73 78    Resp:      Temp: (!) 97.5 F (36.4 C)  (!) 97.3 F (36.3 C)   TempSrc: Oral  Axillary   SpO2: 99% 96%    Weight:      Height:       Physical Exam Gen:- Awake Alert, dyspnea with minimal exertion  HEENT:- [place].AT, No sclera icterus Nose- [place] 8L/min--alternating with BiPAP Neck-Supple Neck,No JVD,.  Lungs-  No wheezing, fair air ovement bilaterally , sternotomy scar CV- S1, S2 normal, RRR, metallic valve click Abd-  +ve B.Sounds, Abd Soft, No tenderness,    Extremity/Skin: Chronic Venous Stasis, good pedal pulses  Psych-affect is flat, oriented x3 Neuro-generalized weakness no new focal deficits, no tremors Psychiatry: Judgement and insight appear normal. Mood & affect appropriate.   Family Communication: Son , Amy Hua is primary contact.  Disposition: SNF when medically stable Status is: Inpatient  Author: Shon Hale, MD 09/18/2023 9:34 AM  For on call review [email or web address].

## 2023-09-19 DIAGNOSIS — J9621 Acute and chronic respiratory failure with hypoxia: Secondary | ICD-10-CM | POA: Diagnosis not present

## 2023-09-19 LAB — CBC
HCT: 30 % — ABNORMAL LOW (ref 36.0–46.0)
Hemoglobin: 9.2 g/dL — ABNORMAL LOW (ref 12.0–15.0)
MCH: 27.5 pg (ref 26.0–34.0)
MCHC: 30.7 g/dL (ref 30.0–36.0)
MCV: 89.6 fL (ref 80.0–100.0)
Platelets: 156 10*3/uL (ref 150–400)
RBC: 3.35 MIL/uL — ABNORMAL LOW (ref 3.87–5.11)
RDW: 16.5 % — ABNORMAL HIGH (ref 11.5–15.5)
WBC: 7 10*3/uL (ref 4.0–10.5)
nRBC: 0 % (ref 0.0–0.2)

## 2023-09-19 LAB — RENAL FUNCTION PANEL
Albumin: 3.4 g/dL — ABNORMAL LOW (ref 3.5–5.0)
Anion gap: 8 (ref 5–15)
BUN: 53 mg/dL — ABNORMAL HIGH (ref 8–23)
CO2: 39 mmol/L — ABNORMAL HIGH (ref 22–32)
Calcium: 8.7 mg/dL — ABNORMAL LOW (ref 8.9–10.3)
Chloride: 90 mmol/L — ABNORMAL LOW (ref 98–111)
Creatinine, Ser: 1.43 mg/dL — ABNORMAL HIGH (ref 0.44–1.00)
GFR, Estimated: 39 mL/min — ABNORMAL LOW (ref >60)
Glucose, Bld: 88 mg/dL (ref 70–99)
Phosphorus: 2.1 mg/dL — ABNORMAL LOW (ref 2.5–4.6)
Potassium: 3.7 mmol/L (ref 3.5–5.1)
Sodium: 137 mmol/L (ref 135–145)

## 2023-09-19 LAB — BRAIN NATRIURETIC PEPTIDE: B Natriuretic Peptide: 560 pg/mL — ABNORMAL HIGH (ref 0.0–100.0)

## 2023-09-19 LAB — PROTIME-INR
INR: 2.4 — ABNORMAL HIGH (ref 0.8–1.2)
Prothrombin Time: 26.3 s — ABNORMAL HIGH (ref 11.4–15.2)

## 2023-09-19 MED ORDER — ORAL CARE MOUTH RINSE: 15.0000 mL | OROMUCOSAL | Status: DC | PRN

## 2023-09-19 MED ORDER — WARFARIN SODIUM 1 MG PO TABS
1.5000 mg | ORAL_TABLET | Freq: Once | ORAL | Status: AC
  Administered 2023-09-19: 1.5 mg via ORAL
  Filled 2023-09-19: qty 1

## 2023-09-19 NOTE — Progress Notes (Signed)
Progress Note  Patient: [name] WJX:[id number] DOB: 1950-12-02 DOA: 09/09/2023     10 DOS: the patient was seen and examined on 09/19/2023   Brief hospital course: [name] is a 72 y.o. female with medical history significant of chronic diastolic heart failure, atrial fibrillation, history of aortic valve replacement (mechanical valve) chronically on Coumadin, history of COPD, chronic respiratory failure (on 2 L at baseline), gastroesophageal reflux disease, hypothyroidism, Paget's disease and restless leg syndrome; who presented to the hospital secondary to worsening shortness of breath.  Symptoms has been present for the last 3-4 days and worsening.  Family noticed decrease in care station capacity due to shortness of breath and also increased requirement for oxygen supplementation.  At home bronchodilator management has failed to improve symptoms.   Patient reports dry coughing spells, increased wheezing and decreased appetite.  No fever, no chest pain, no abdominal pain, no dysuria, no hematuria, no sick contacts, no focal weaknesses or any other complaints.   Per family at bedside patient actively seeing for positive left nipple changes and breast discharge.   Nebulizer management, IV antibiotics and IV Lasix initiated at presentation.  COVID PCR negative.   CT chest: Demonstrating moderate cardiomegaly, enlarged pulmonary arteries suggesting pulmonary hypertension and the presence of mild bilateral pleural effusion along with atelectasis/bronchitic changes on the adjacent lower lobes.  Mild bilateral pulmonary edema appreciated.  Assessment and Plan: 1-Acute on chronic respiratory failure with hypoxia and hypercapnia -Appears to be multifactorial in the setting of COPD exacerbation and CHF exacerbation -Hypoxia persist requiring high flow oxygen at 12 L alternating with BiPAP -ABG with hypercapnia and uncompensated respiratory acidosis despite BiPAP use -At baseline usually  uses 3 and half liters of oxygen via nasal cannula prior to admission 09/19/23 -Hypoxia improving slowly currently weaned down to 7L of oxygen via nasal cannula -Continue to use BiPAP nightly No significant coughing, dyspnea improving -Continue IV Solu-Medrol, bronchodilators  -Completed doxycycline Repeat chest x-ray on 09/18/2023 with congestive heart failure/pulm edema Continue t IV Lasix 40 mg daily restarted on 09/18/2023 -Monitor I's and O's and daily weights REds Vest  2--AKI----acute kidney injury on CKD stage -3B -Creatinine peaked at 2.42 Baseline Creatinine---0.9 to 1.1  -Monitor creatinine closely as we are restarting IV Lasix on 09/18/2023 after initial diuretic holiday -Creatinine continues to trend down currently down to 1.43 -Nephrology consult appreciated -renally adjust medications, avoid nephrotoxic agents / dehydration  / hypotension  3-HTN -Continue Cardizem and bisoprolol--mostly for A-fib rate control  4-hypothyroidism -Continue Synthroid. -TSH within normal limits.  5-Gastroesophageal flux disease -Continue Protonix  6-COPD exacerbation Hypoxia and oxygen requirement improving as noted above -See #1 above  7-acute on chronic diastolic heart failure ---Echo from 09/10/2023 with EF of 65 to 70% , There is the interventricular septum is flattened in systole and diastole, consistent with right ventricular pressure and  volume overload -Repeat chest x-ray on 09/18/2023 with congestive heart failure/pulm edema -Restart IV Lasix 40 mg daily on 09/18/2023 -Monitor I's and O's and daily weights REds Vest -Cardiology consult appreciated  8-s/p  mechanical Aortic  heart valve replacement -Echo from 09/10/2023 shows-25 mm St. Jude mechanical valve present in the aortic position. Aortic valve mean gradient measures 5.0 mmHg.  -Continue Coumadin per pharmacy.  9-hyperlipidemia -Continue Crestor.  10-history of chronic atrial fibrillation -Continue Cardizem  and bisoprolol for rate control C/n coumadin for stroke prophylaxis  11-Dysphagia -Speech Eval 09/11/23--pt has moderate aspiration risk -Rec Regular solids and thin Liquids  12)Lt Nipple Discharge/Lesion----prior  mammogram and chest imaging study without definite evidence for breast cancer -Patient was scheduled for possible punch biopsy of the left nipple lesion due to concerns about possible Paget's disease of the breast  13)Social/Ethics--- plan of care and CODE STATUS/advanced directives discussed with patient and patient's son Amy Hua previously -They request DNR DNI, No Limitations to treatment otherwise -Ongoing palliative care conversations at this time  Subjective:  --Dyspnea improving -No productive cough -Oxygen requirement improving slowly  Physical Exam: Vitals:   09/19/23 1100 09/19/23 1200 09/19/23 1300 09/19/23 1654  BP: 126/76 113/77 129/86   Pulse: 86 83 90   Resp: (!) 21 18 16    Temp:    97.9 F (36.6 C)  TempSrc:    Oral  SpO2: 96% 98% 92%   Weight:      Height:       Physical Exam Gen:- Awake Alert, dyspnea improving HEENT:- [place].AT, No sclera icterus Nose- [place] 7L/min--alternating with BiPAP Neck-Supple Neck,No JVD,.  Lungs-  No wheezing, fair air ovement bilaterally , sternotomy scar CV- S1, S2 normal, RRR, metallic valve click Abd-  +ve B.Sounds, Abd Soft, No tenderness,    Extremity/Skin: Chronic Venous Stasis, good pedal pulses  Psych-affect is flat, oriented x3 Neuro-generalized weakness no new focal deficits, no tremors Psychiatry: Judgement and insight appear normal. Mood & affect appropriate.   Family Communication: Son , Amy Hua is primary contact.  Disposition: SNF when medically stable Status is: Inpatient  Author: Shon Hale, MD 09/19/2023 5:40 PM  For on call review [email or web address].

## 2023-09-19 NOTE — Progress Notes (Signed)
Patient ID: [name], female   DOB: 02-23-1951, 72 y.o.   MRN: [id number]   Subjective:  Patient seen and examined in ICU. No acute events. She reports that her breathing seems to be better. Uop charted ~2.1L Total net neg ~4.2L since admit  O:BP 129/86   Pulse 90   Temp 97.7 F (36.5 C) (Oral)   Resp 16   Ht 5\' 8"  (1.727 m)   Wt 62.7 kg   SpO2 92%   BMI 21.02 kg/m   Intake/Output Summary (Last 24 hours) at 09/19/2023 1620 Last data filed at 09/19/2023 1300 Gross per 24 hour  Intake 800 ml  Output 2575 ml  Net -1775 ml   Intake/Output: I/O last 3 completed shifts: In: 1440 [P.O.:1440] Out: 2725 [Urine:2725]  Intake/Output this shift:  Total I/O In: 320 [P.O.:320] Out: 1000 [Urine:1000] Weight change:   General chronically ill appearing elderly female Lungs diminished air entry bibasilar, normal wob Heart mechanical valve; regular rate Abdomen soft nontender nondistended Extremities very trace edema  bl LEs Neuro- awake, alert, very hard of hearing  Recent Labs  Lab 09/13/23 0405 09/14/23 0543 09/15/23 0349 09/16/23 0504 09/17/23 0458 09/18/23 0444 09/19/23 0512  NA 134* 133* 135 134* 137 137 137  K 4.8 4.5 4.5 4.2 4.3 4.3 3.7  CL 88* 87* 89* 89* 91* 92* 90*  CO2 33* 31 34* 33* 37* 37* 39*  GLUCOSE 76 98 115* 120* 115* 119* 88  BUN 45* 58* 68* 64* 57* 58* 53*  CREATININE 2.42* 2.42* 2.29* 1.97* 1.71* 1.55* 1.43*  ALBUMIN  --   --  3.5 3.7 3.4* 3.4* 3.4*  CALCIUM 8.9 8.9 8.9 9.1 9.1 9.1 8.7*  PHOS  --   --  4.5 3.2 2.7 2.6 2.1*   Liver Function Tests: Recent Labs  Lab 09/17/23 0458 09/18/23 0444 09/19/23 0512  ALBUMIN 3.4* 3.4* 3.4*   No results for input(s): "LIPASE", "AMYLASE" in the last 168 hours. No results for input(s): "AMMONIA" in the last 168 hours. CBC: Recent Labs  Lab 09/14/23 0543 09/17/23 0458 09/19/23 0512  WBC 3.7* 4.1 7.0  HGB 9.7* 8.6* 9.2*  HCT 33.2* 29.0* 30.0*  MCV 92.5 91.5 89.6  PLT 204 161 156   Cardiac  Enzymes: No results for input(s): "CKTOTAL", "CKMB", "CKMBINDEX", "TROPONINI" in the last 168 hours. CBG: No results for input(s): "GLUCAP" in the last 168 hours.   Iron Studies: No results for input(s): "IRON", "TIBC", "TRANSFERRIN", "FERRITIN" in the last 72 hours. Studies/Results: DG CHEST PORT 1 VIEW  Result Date: 09/18/2023 CLINICAL DATA:  [id number] Dyspnea and respiratory abnormalities [id number] EXAM: PORTABLE CHEST 1 VIEW COMPARISON:  09/14/2023. FINDINGS: Redemonstration of increased interstitial markings, essentially similar to the prior study. There are probable bilateral layering pleural effusions. No significant interval change. No pneumothorax. There are bibasilar retrocardiac opacities partially obscuring the hemidiaphragms, which may represent combination of atelectasis and/or consolidation. Stable moderately enlarged cardio-mediastinal silhouette. No acute osseous abnormalities. The soft tissues are within normal limits. IMPRESSION: *Persistent mild-to-moderate congestive heart failure/pulmonary edema. Electronically Signed   By: Jules Schick M.D.   On: 09/18/2023 08:15    bisoprolol  5 mg Oral Daily   budesonide (PULMICORT) nebulizer solution  1 mg Nebulization BID   Chlorhexidine Gluconate Cloth  6 each Topical Daily   DermaCerin   Topical BID   diltiazem  120 mg Oral Daily   feeding supplement  237 mL Oral BID BM   furosemide  40 mg Intravenous Daily   ipratropium  0.5 mg Nebulization Q6H   levalbuterol  0.63 mg Nebulization Q6H   levothyroxine  50 mcg Oral QAC breakfast   methylPREDNISolone (SOLU-MEDROL) injection  80 mg Intravenous Daily   mouth rinse  15 mL Mouth Rinse 4 times per day   pantoprazole  40 mg Oral Daily   polyethylene glycol  17 g Oral Daily   rOPINIRole  3 mg Oral BID   rosuvastatin  10 mg Oral Daily   senna-docusate  2 tablet Oral QHS   sodium chloride flush  3 mL Intravenous Q12H   warfarin  1.5 mg Oral ONCE-1600   Warfarin - Pharmacist Dosing  Inpatient   Does not apply q1600    BMET    Component Value Date/Time   NA 137 09/19/2023 0512   K 3.7 09/19/2023 0512   CL 90 (L) 09/19/2023 0512   CO2 39 (H) 09/19/2023 0512   GLUCOSE 88 09/19/2023 0512   BUN 53 (H) 09/19/2023 0512   CREATININE 1.43 (H) 09/19/2023 0512   CREATININE 1.14 (H) 04/15/2013 1325   CALCIUM 8.7 (L) 09/19/2023 0512   GFRNONAA 39 (L) 09/19/2023 0512   GFRAA 32 (L) 05/30/2019 0509   CBC    Component Value Date/Time   WBC 7.0 09/19/2023 0512   RBC 3.35 (L) 09/19/2023 0512   HGB 9.2 (L) 09/19/2023 0512   HCT 30.0 (L) 09/19/2023 0512   PLT 156 09/19/2023 0512   MCV 89.6 09/19/2023 0512   MCH 27.5 09/19/2023 0512   MCHC 30.7 09/19/2023 0512   RDW 16.5 (H) 09/19/2023 0512   LYMPHSABS 0.5 (L) 09/09/2023 0942   MONOABS 0.2 09/09/2023 0942   EOSABS 0.0 09/09/2023 0942   BASOSABS 0.0 09/09/2023 0942    Assessment/Plan:  AKI/CKD stage IIIa - 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  Slowly resolving with supportive care and holding diuretic.  Urine Na <10 so consistent with pre-renal insult and likely decreased effective intravascular volume +/- contrast.  Renal US without obstruction and consistent with chronic medical renal disease.  Patient is a poor candidate for longterm dialysis given her multiple co-morbidities and poor functional and nutritional status. Cr down to 1.4, nonoliguric Strict ins/outs  Palliative care is following and thankfully renal function is improving    Acute on chronic  respiratory failure - hypoxia and hypercapnia.  Likely COPD exacerbation as well as CHF.  Steroids, nebulizers, and antibiotics per primary team.  Bipap at bedtime. Acute on chronic diastolic CHF - she has been started back on lasix IV daily.  Will give additional dose this afternoon, as well   S/p mechanical AVR  - on coumadin per primary team  Atrial fibrillation - agents per primary team. On dilt, bisoprolol, and  anticoagulation HTN - acceptable control  DM - per primary team Disposition - pt is DNR, ongoing goals of care discussions.  Palliative care has seen her and they recommend continued outpatient palliative care involvement   Kidney function continues to improve. Nothing else to add at this junction from an inpatient nephrology perspective. Will sign off. Discussed with primary service. Please feel free to call with any questions/concerns.  Anthony Sar, MD 4:20 PM 09/19/2023

## 2023-09-19 NOTE — Progress Notes (Signed)
PHARMACY - ANTICOAGULATION CONSULT NOTE  Pharmacy Consult for Coumadin Indication:  aortic mechanical valve    Allergies  Allergen Reactions   Penicillin G Itching and Other (See Comments)   Penicillins Hives, Itching, Swelling and Rash    Patient Measurements: Height: 5\' 8"  (172.7 cm) Weight: 62.7 kg (138 lb 3.7 oz) IBW/kg (Calculated) : 63.9   Vital Signs: Temp: 97.7 F (36.5 C) (11/30 1044) Temp Source: Oral (11/30 1044) BP: 110/56 (11/30 0800) Pulse Rate: 91 (11/30 0800)  Labs: Recent Labs    09/17/23 0458 09/18/23 0444 09/19/23 0512  HGB 8.6*  --  9.2*  HCT 29.0*  --  30.0*  PLT 161  --  156  LABPROT 17.7* 21.7* 26.3*  INR 1.4* 1.9* 2.4*  CREATININE 1.71* 1.55* 1.43*    Estimated Creatinine Clearance: 35.7 mL/min (A) (by C-G formula based on SCr of 1.43 mg/dL (H)).   Medical History: Past Medical History:  Diagnosis Date   Anxiety disorder    Aortic stenosis    Status post St. Jude mechanical AVR 2007   Asthma    Atrial fibrillation (HCC)    Carcinoid tumor of colon 2007   Chronic diastolic heart failure (HCC)    Coronary atherosclerosis of native coronary artery    Status post CABG 2007   GERD (gastroesophageal reflux disease)    History of colonoscopy 2003   Dr. 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 Jena Gauss, benign biopsies   RLS (restless legs syndrome)    Schatzki's ring    Assessment: Patient presented with SOB. Pharmacy consulted to dose warfarin in patient with atrial fibrillation and mechanical AVR. Goal INR is 1-2 per anticoagulation clinic notes. Patient's warfarin home dose in med rec listed as 4 mg on Tuesday and 3 mg AOD, however clinic note from 05/27/2023 points toward lower warfarin dose in the past. Patient unable to effectively communicate at this time.  INR appears very labile and patient's oral intake is poor, thus increasing warfarin  sensitivity. Vitamin K 5 mg IV given on 11/26. Will likely take a few days to get back to therapeutic due to this.   INR is continuing to trend toward therapeutic but oral intake remains low, albeit improving slowly.  Home Warfarin Regimen: Potential home regimens due to conflicting information in the EHR 4 mg Tues, 3 mg AOD (med rec) Total weekly warfarin: 22 mg Average daily dose: ~3 mg 1 mg Fri, 2 mg AOD (taken from anticoag clinic visit 05/27/2023) Total weekly warfarin: 13 mg Average daily dose: ~2 mg  Date INR Dose  11/27 1.5 2.5 mg  11/28 1.4 2.5 mg  11/29 1.9 2.5 mg  11/30 2.4 1.5 mg    Goal of Therapy:  INR 2-3 Monitor platelets by anticoagulation protocol: Yes   Plan: Regimen as denoted in August anticoagulation clinic note appears to better align with patient's current warfarin requirements in the setting of reduced oral intake and acute illness. Warfarin 1.5 mg x 1 Daily INR Continue to monitor for signs and symptoms of bleeding   Will M. Dareen Piano, PharmD Clinical Pharmacist 09/19/2023 11:01 AM

## 2023-09-19 NOTE — Plan of Care (Signed)

## 2023-09-20 DIAGNOSIS — J9621 Acute and chronic respiratory failure with hypoxia: Secondary | ICD-10-CM | POA: Diagnosis not present

## 2023-09-20 LAB — RENAL FUNCTION PANEL
Albumin: 3.3 g/dL — ABNORMAL LOW (ref 3.5–5.0)
Anion gap: 10 (ref 5–15)
BUN: 45 mg/dL — ABNORMAL HIGH (ref 8–23)
CO2: 39 mmol/L — ABNORMAL HIGH (ref 22–32)
Calcium: 8.6 mg/dL — ABNORMAL LOW (ref 8.9–10.3)
Chloride: 89 mmol/L — ABNORMAL LOW (ref 98–111)
Creatinine, Ser: 1.45 mg/dL — ABNORMAL HIGH (ref 0.44–1.00)
GFR, Estimated: 39 mL/min — ABNORMAL LOW (ref >60)
Glucose, Bld: 104 mg/dL — ABNORMAL HIGH (ref 70–99)
Phosphorus: 3.1 mg/dL (ref 2.5–4.6)
Potassium: 3.8 mmol/L (ref 3.5–5.1)
Sodium: 138 mmol/L (ref 135–145)

## 2023-09-20 LAB — PROTIME-INR
INR: 2.7 — ABNORMAL HIGH (ref 0.8–1.2)
Prothrombin Time: 28.8 s — ABNORMAL HIGH (ref 11.4–15.2)

## 2023-09-20 MED ORDER — WARFARIN SODIUM 1 MG PO TABS
1.5000 mg | ORAL_TABLET | Freq: Once | ORAL | Status: AC
  Administered 2023-09-20: 1.5 mg via ORAL
  Filled 2023-09-20: qty 1

## 2023-09-20 NOTE — Progress Notes (Signed)
Patient was already off Bipap and on [place] at this time.

## 2023-09-20 NOTE — Progress Notes (Signed)
PHARMACY - ANTICOAGULATION CONSULT NOTE  Pharmacy Consult for Coumadin Indication:  aortic mechanical valve    Allergies  Allergen Reactions   Penicillin G Itching and Other (See Comments)   Penicillins Hives, Itching, Swelling and Rash    Patient Measurements: Height: 5\' 8"  (172.7 cm) Weight: 62 kg (136 lb 11 oz) IBW/kg (Calculated) : 63.9   Vital Signs: Temp: 98.5 F (36.9 C) (12/01 0816) Temp Source: Oral (12/01 0816) BP: 117/65 (12/01 0600) Pulse Rate: 82 (12/01 0606)  Labs: Recent Labs    09/18/23 0444 09/19/23 0512 09/20/23 0525  HGB  --  9.2*  --   HCT  --  30.0*  --   PLT  --  156  --   LABPROT 21.7* 26.3* 28.8*  INR 1.9* 2.4* 2.7*  CREATININE 1.55* 1.43* 1.45*    Estimated Creatinine Clearance: 34.8 mL/min (A) (by C-G formula based on SCr of 1.45 mg/dL (H)).   Medical History: Past Medical History:  Diagnosis Date   Anxiety disorder    Aortic stenosis    Status post St. Jude mechanical AVR 2007   Asthma    Atrial fibrillation (HCC)    Carcinoid tumor of colon 2007   Chronic diastolic heart failure (HCC)    Coronary atherosclerosis of native coronary artery    Status post CABG 2007   GERD (gastroesophageal reflux disease)    History of colonoscopy 2003   Dr. 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 Jena Gauss, benign biopsies   RLS (restless legs syndrome)    Schatzki's ring    Assessment: Patient presented with SOB. Pharmacy consulted to dose warfarin in patient with atrial fibrillation and mechanical AVR. Goal INR is 1-2 per anticoagulation clinic notes. Patient's warfarin home dose in med rec listed as 4 mg on Tuesday and 3 mg AOD, however clinic note from 05/27/2023 points toward lower warfarin dose in the past. Patient unable to effectively communicate at this time.  INR appears very labile and patient's oral intake is poor, thus increasing warfarin sensitivity.  Vitamin K 5 mg IV given on 11/26. Will likely take a few days to get back to therapeutic due to this.   INR is continuing to trend toward therapeutic but oral intake remains low, albeit improving slowly. On 12/1, INR still increasing but rate of increase has slowed and patient remains therapeutic.  Home Warfarin Regimen: Potential home regimens due to conflicting information in the EHR 4 mg Tues, 3 mg AOD (med rec) Total weekly warfarin: 22 mg Average daily dose: ~3 mg 1 mg Fri, 2 mg AOD (taken from anticoag clinic visit 05/27/2023) Total weekly warfarin: 13 mg Average daily dose: ~2 mg  Date INR Dose  11/27 1.5 2.5 mg  11/28 1.4 2.5 mg  11/29 1.9 2.5 mg  11/30 2.4 1.5 mg  12/1 2.7 1.5 mg    Goal of Therapy:  INR 2-3 Monitor platelets by anticoagulation protocol: Yes   Plan: Regimen as denoted in August anticoagulation clinic note appears to better align with patient's current warfarin requirements in the setting of reduced oral intake and acute illness. Warfarin 1.5 mg x 1 Daily INR Continue to monitor for signs and symptoms of bleeding   Will M. Dareen Piano, PharmD Clinical Pharmacist 09/20/2023 9:01 AM

## 2023-09-20 NOTE — Progress Notes (Signed)
Progress Note  Patient: [name] ZOX:[id number] DOB: 1950-12-05 DOA: 09/09/2023     11 DOS: the patient was seen and examined on 09/20/2023   Brief hospital course: [name] is a 72 y.o. female with medical history significant of chronic diastolic heart failure, atrial fibrillation, history of aortic valve replacement (mechanical valve) chronically on Coumadin, history of COPD, chronic respiratory failure (on 2 L at baseline), gastroesophageal reflux disease, hypothyroidism, Paget's disease and restless leg syndrome; who presented to the hospital secondary to worsening shortness of breath.  Symptoms has been present for the last 3-4 days and worsening.  Family noticed decrease in care station capacity due to shortness of breath and also increased requirement for oxygen supplementation.  At home bronchodilator management has failed to improve symptoms.   Patient reports dry coughing spells, increased wheezing and decreased appetite.  No fever, no chest pain, no abdominal pain, no dysuria, no hematuria, no sick contacts, no focal weaknesses or any other complaints.   Per family at bedside patient actively seeing for positive left nipple changes and breast discharge.   Nebulizer management, IV antibiotics and IV Lasix initiated at presentation.  COVID PCR negative.   CT chest: Demonstrating moderate cardiomegaly, enlarged pulmonary arteries suggesting pulmonary hypertension and the presence of mild bilateral pleural effusion along with atelectasis/bronchitic changes on the adjacent lower lobes.  Mild bilateral pulmonary edema appreciated.  Assessment and Plan: 1-Acute on chronic respiratory failure with hypoxia and hypercapnia -Appears to be multifactorial in the setting of COPD exacerbation and CHF exacerbation -Hypoxia persist requiring high flow oxygen at 12 L alternating with BiPAP -ABG with hypercapnia and uncompensated respiratory acidosis despite BiPAP use -At baseline usually  uses 3 and half liters of oxygen via nasal cannula prior to admission 09/20/23 -Hypoxia improving slowly currently weaned down to 7L of oxygen via nasal cannula -Dyspnea with positional change -No chest pains or productive cough -Continue IV Solu-Medrol, bronchodilators  -Completed doxycycline Repeat chest x-ray on 09/18/2023 with congestive heart failure/pulm edema Continue t IV Lasix 40 mg daily restarted on 09/18/2023 -Monitor I's and O's and daily weights REds Vest -Continue BiPAP nightly -Repeat chest x-ray on 09/21/2023  2--AKI----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 -TSH within normal limits.  5-Gastroesophageal flux disease -Continue Protonix  6-COPD exacerbation Hypoxia and oxygen requirement improving as noted above -See #1 above  7-acute on chronic diastolic heart failure ---Echo from 09/10/2023 with EF of 65 to 70% , There is the interventricular septum is flattened in systole and diastole, consistent with right ventricular pressure and  volume overload -Repeat chest x-ray on 09/18/2023 with congestive heart failure/pulm edema -Restart IV Lasix 40 mg daily on 09/18/2023 -Monitor I's and O's and daily weights REds Vest -Cardiology consult appreciated  8-s/p  mechanical Aortic  heart valve replacement -Echo from 09/10/2023 shows-25 mm St. Jude mechanical valve present in the aortic position. Aortic valve mean gradient measures 5.0 mmHg.  -Continue Coumadin per pharmacy.  9-hyperlipidemia -Continue  Crestor.  10-history of chronic atrial fibrillation -Continue Cardizem and bisoprolol for rate control C/n coumadin for stroke prophylaxis  11-Dysphagia -Speech Eval 09/11/23--pt has moderate aspiration risk -Rec Regular  solids and thin Liquids  12)Lt Nipple Discharge/Lesion----prior mammogram and chest imaging study without definite evidence for breast cancer -Patient was scheduled for possible punch biopsy of the left nipple lesion due to concerns about possible Paget's disease of the breast  13)Social/Ethics--- plan of care and CODE STATUS/advanced directives discussed with patient and patient's son [name] previously -They request DNR DNI, No Limitations to treatment otherwise -Ongoing palliative care conversations at this time  Subjective:  -- Dyspnea with positional change -No chest pains or productive cough -Appetite is not great  Physical Exam: Vitals:   09/20/23 0716 09/20/23 0800 09/20/23 0816 09/20/23 0900  BP:  126/60  107/68  Pulse:  92  98  Resp:  18  (!) 30  Temp:   98.5 F (36.9 C)   TempSrc:   Oral   SpO2: 93% 95%  93%  Weight:      Height:       Physical Exam Gen:- Awake Alert, becomes dyspneic with positional change  HEENT:- [place].AT, No sclera icterus Nose- [place] 7L/min--alternating with BiPAP Neck-Supple Neck,No JVD,.  Lungs-  No wheezing, fair air ovement bilaterally , sternotomy scar CV- S1, S2 normal, RRR, metallic valve click Abd-  +ve B.Sounds, Abd Soft, No tenderness,    Extremity/Skin: Chronic Venous Stasis, good pedal pulses  Psych-affect is flat, oriented x3 Neuro-generalized weakness no new focal deficits, no tremors Psychiatry: Judgement and insight appear normal. Mood & affect appropriate.   Family Communication: Son , [name] is primary contact.  Disposition: SNF when medically stable Status is: Inpatient  Author: Shon Hale, MD 09/20/2023 12:03 PM  For on call review [email or web address].

## 2023-09-20 NOTE — Plan of Care (Signed)

## 2023-09-21 ENCOUNTER — Inpatient Hospital Stay (HOSPITAL_COMMUNITY): Payer: Medicare Other

## 2023-09-21 DIAGNOSIS — Z7189 Other specified counseling: Secondary | ICD-10-CM | POA: Diagnosis not present

## 2023-09-21 DIAGNOSIS — J9621 Acute and chronic respiratory failure with hypoxia: Secondary | ICD-10-CM | POA: Diagnosis not present

## 2023-09-21 DIAGNOSIS — Z515 Encounter for palliative care: Secondary | ICD-10-CM | POA: Diagnosis not present

## 2023-09-21 LAB — RENAL FUNCTION PANEL
Albumin: 3.2 g/dL — ABNORMAL LOW (ref 3.5–5.0)
Anion gap: 9 (ref 5–15)
BUN: 40 mg/dL — ABNORMAL HIGH (ref 8–23)
CO2: 39 mmol/L — ABNORMAL HIGH (ref 22–32)
Calcium: 8.6 mg/dL — ABNORMAL LOW (ref 8.9–10.3)
Chloride: 89 mmol/L — ABNORMAL LOW (ref 98–111)
Creatinine, Ser: 1.51 mg/dL — ABNORMAL HIGH (ref 0.44–1.00)
GFR, Estimated: 37 mL/min — ABNORMAL LOW (ref >60)
Glucose, Bld: 123 mg/dL — ABNORMAL HIGH (ref 70–99)
Phosphorus: 3.3 mg/dL (ref 2.5–4.6)
Potassium: 3.6 mmol/L (ref 3.5–5.1)
Sodium: 137 mmol/L (ref 135–145)

## 2023-09-21 LAB — PROTIME-INR
INR: 2.7 — ABNORMAL HIGH (ref 0.8–1.2)
Prothrombin Time: 29.1 s — ABNORMAL HIGH (ref 11.4–15.2)

## 2023-09-21 MED ORDER — WARFARIN SODIUM 1 MG PO TABS
1.0000 mg | ORAL_TABLET | Freq: Once | ORAL | Status: AC
  Administered 2023-09-21: 1 mg via ORAL
  Filled 2023-09-21: qty 1

## 2023-09-21 MED ORDER — WARFARIN SODIUM 1 MG PO TABS: 1.5000 mg | ORAL_TABLET | Freq: Once | ORAL | Status: DC

## 2023-09-21 MED ORDER — FUROSEMIDE 10 MG/ML IJ SOLN
20.0000 mg | Freq: Every day | INTRAMUSCULAR | Status: DC
  Administered 2023-09-21: 20 mg via INTRAVENOUS
  Filled 2023-09-21: qty 2

## 2023-09-21 MED ORDER — BISOPROLOL FUMARATE 5 MG PO TABS
2.5000 mg | ORAL_TABLET | Freq: Every day | ORAL | Status: DC
  Administered 2023-09-22 – 2023-09-23 (×2): 2.5 mg via ORAL
  Filled 2023-09-21 (×2): qty 1

## 2023-09-21 MED ORDER — SIMETHICONE 80 MG PO CHEW
80.0000 mg | CHEWABLE_TABLET | Freq: Once | ORAL | Status: AC
  Administered 2023-09-21: 80 mg via ORAL
  Filled 2023-09-21: qty 1

## 2023-09-21 NOTE — Progress Notes (Signed)
Progress Note  Patient: [name] BJY:[id number] DOB: [birth year] DOA: 09/09/2023     12 DOS: the patient was seen and examined on 09/21/2023   Brief hospital course: [name] is a 72 y.o. female with medical history significant of chronic diastolic heart failure, atrial fibrillation, history of aortic valve replacement (mechanical valve) chronically on Coumadin, history of COPD, chronic respiratory failure (on 2 L at baseline), gastroesophageal reflux disease, hypothyroidism, Paget's disease and restless leg syndrome; who presented to the hospital secondary to worsening shortness of breath.  Symptoms has been present for the last 3-4 days and worsening.  Family noticed decrease in care station capacity due to shortness of breath and also increased requirement for oxygen supplementation.  At home bronchodilator management has failed to improve symptoms.   Patient reports dry coughing spells, increased wheezing and decreased appetite.  No fever, no chest pain, no abdominal pain, no dysuria, no hematuria, no sick contacts, no focal weaknesses or any other complaints.   Per family at bedside patient actively seeing for positive left nipple changes and breast discharge.   Nebulizer management, IV antibiotics and IV Lasix initiated at presentation.  COVID PCR negative.   CT chest: Demonstrating moderate cardiomegaly, enlarged pulmonary arteries suggesting pulmonary hypertension and the presence of mild bilateral pleural effusion along with atelectasis/bronchitic changes on the adjacent lower lobes.  Mild bilateral pulmonary edema appreciated.  09/21/23 Anticipate discharge to SNF rehab in 1 to 2 days with oxygen via nasal cannula -Patient may No longer be meeting criteria for LTAC  Assessment and Plan: 1-Acute on chronic respiratory failure with hypoxia and hypercapnia -Appears to be multifactorial in the setting of COPD exacerbation and CHF exacerbation -Hypoxia persist requiring high  flow oxygen at 12 L alternating with BiPAP -ABG with hypercapnia and uncompensated respiratory acidosis despite BiPAP use -At baseline usually uses 3 and half liters of oxygen via nasal cannula prior to admission 09/21/23 -Hypoxia improving slowly currently weaned down to 4L of oxygen via nasal cannula -Dyspnea with ambulation -No chest pains or productive cough -Continue IV Solu-Medrol, bronchodilators  -Completed doxycycline Repeat chest x-ray on 09/18/2023 with congestive heart failure/pulm edema -Repeat chest x-ray on 09/21/2023--with much improved CHF Continue  IV Lasix  restarted on 09/18/2023 -Monitor I's and O's and daily weights REds Vest -Continue BiPAP nightly/prn -Anticipate the patient will Not need BiPAP or CPAP at discharge - 2--AKI----acute kidney injury on CKD stage -3B -Creatinine peaked at 2.42 Baseline Creatinine---0.9 to 1.1  -Monitor creatinine closely as we are restarting IV Lasix on 09/18/2023 after initial diuretic holiday -Creatinine continues to trend down currently down to 1.5 -Nephrology consult appreciated -renally adjust medications, avoid nephrotoxic agents / dehydration  / hypotension  3-HTN -Continue Cardizem and bisoprolol--mostly for A-fib rate control  4-Hypothyroidism -Continue Synthroid. -TSH within normal limits.  5-Gastroesophageal flux disease -Continue Protonix  6-COPD exacerbation Hypoxia and oxygen requirement improving as noted above -See #1 above  7-acute on chronic diastolic heart failure ---Echo from 09/10/2023 with EF of 65 to 70% , There is the interventricular septum is flattened in systole and diastole, consistent with right ventricular pressure and  volume overload -Repeat chest x-ray on 09/18/2023 with congestive heart failure/pulm edema -Restart IV Lasix 40 mg daily on 09/18/2023 -Monitor I's and O's and daily weights REds Vest -Cardiology consult appreciated  8-s/p  mechanical Aortic  heart valve replacement -Echo  from 09/10/2023 shows-25 mm St. Jude mechanical valve present in the aortic position. Aortic valve mean gradient measures 5.0 mmHg.  -  Continue Coumadin per pharmacy.  9-hyperlipidemia -Continue Crestor.  10-history of chronic atrial fibrillation -Continue Cardizem and bisoprolol for rate control C/n coumadin for stroke prophylaxis  11-Dysphagia -Speech Eval 09/11/23--pt has moderate aspiration risk -Rec Regular solids and thin Liquids  12)Lt Nipple Discharge/Lesion----prior mammogram and chest imaging study without definite evidence for breast cancer -Patient was scheduled for possible punch biopsy of the left nipple lesion due to concerns about possible Paget's disease of the breast -See photos in epic  13)Social/Ethics--- plan of care and CODE STATUS/advanced directives discussed with patient and patient's son Amy Hua previously -They request DNR DNI, No Limitations to treatment otherwise -Ongoing palliative care conversations at this time  Disposition:-Anticipate discharge to SNF rehab in 1 to 2 days with oxygen via nasal cannula -Patient may No longer be meeting criteria for LTAC  Subjective:  -- -Son Amy Hua at bedside -Ambulated with physical therapist--desaturated briefly with taking steps -No fever  Or chills  -Voiding well  Physical Exam: Vitals:   09/21/23 1326 09/21/23 1327 09/21/23 1349 09/21/23 1400  BP:    (!) 92/49  Pulse:  87 82 85  Resp:  (!) 45 (!) 38 (!) 38  Temp:      TempSrc:      SpO2: 98% 99% 98% 99%  Weight:      Height:       Physical Exam Gen:- Awake Alert, becomes dyspneic with activity HEENT:- [place].AT, No sclera icterus Nose- [place] 4L/min-  Neck-Supple Neck,No JVD,.  Lungs-improving air movement, no wheezing , sternotomy scar CV- S1, S2 normal, RRR, metallic valve click Abd-  +ve B.Sounds, Abd Soft, No tenderness,    Extremity/Skin: Chronic Venous Stasis, good pedal pulses  Neuro-generalized weakness no new focal deficits, no tremors Psychiatry:  Affect is flat, judgement and insight appear normal. Mood & affect appropriate.   Family Communication: Son , Amy Hua is primary contact.  Disposition: SNF when medically stable Status is: Inpatient  Author: Shon Hale, MD 09/21/2023 3:05 PM  For on call review [email or web address].

## 2023-09-21 NOTE — TOC Progression Note (Addendum)
Transition of Care [facility]) - Progression Note    Patient Details  Name: [name] MRN: [id number] Date of Birth: 1951/03/12  [facility] CM/SW Contact  Villa Herb, [place] Phone Number: 09/21/2023, 3:00 PM  Clinical Narrative:    CSW spoke to Destiny with [facility], they are able to offer bed for SNF to pt. CSW updated pts son, he is agreeable to accepting this bed offer. CSW updated by RN and respiratory that pt has been bumped up on O2 requirements. CSW spoke to [facility] who states they can offer a bed and start auth if this is desired by pt and family. MD to speak with pts son about LTACH vs SNF.   CSW updated by MD that pts son is agreeable to [facility], [facility] states they do not feel pt will qualify as O2 is improving. Pts son agreeable to SNF at [facility] if needed. TOC to start auth. TOC to follow.   Expected Discharge Plan: Home w Home Health Services Barriers to Discharge: Continued Medical Work up  Expected Discharge Plan and Services In-house Referral: Clinical Social Work Discharge Planning Services: CM Consult Post Acute Care Choice: Home Health Living arrangements for the past 2 months: Single Family Home                           HH Arranged: RN, PT HH Agency: [facility] Date [facility] Agency Contacted: 09/10/23   Representative spoke with at [facility] Agency: Maralyn Sago   Social Determinants of Health (SDOH) Interventions SDOH Screenings   Food Insecurity: No Food Insecurity (09/09/2023)  Housing: Low Risk  (09/09/2023)  Transportation Needs: No Transportation Needs (09/09/2023)  Utilities: Not At Risk (09/09/2023)  Financial Resource Strain: Low Risk  (07/28/2018)  Physical Activity: Inactive (07/28/2018)  Social Connections: Somewhat Isolated (07/28/2018)  Stress: No Stress Concern Present (07/28/2018)  Tobacco Use: Medium Risk (09/15/2023)  Health Literacy: Medium Risk (07/01/2023)   Received from [facility] Care    Readmission Risk Interventions     09/10/2023    2:02 PM  Readmission Risk Prevention Plan  Transportation Screening Complete  HRI or Home Care Consult Complete  Social Work Consult for Recovery Care Planning/Counseling Complete  Palliative Care Screening Not Applicable  Medication Review Oceanographer) Complete

## 2023-09-21 NOTE — Progress Notes (Addendum)
PHARMACY - ANTICOAGULATION CONSULT NOTE  Pharmacy Consult for Coumadin Indication:  aortic mechanical valve    Allergies  Allergen Reactions   Penicillin G Itching and Other (See Comments)   Penicillins Hives, Itching, Swelling and Rash    Patient Measurements: Height: 5\' 8"  (172.7 cm) Weight: 62.4 kg (137 lb 9.1 oz) IBW/kg (Calculated) : 63.9   Vital Signs: Temp: 98.5 F (36.9 C) (12/02 0400) Temp Source: Oral (12/02 0400) BP: 100/63 (12/02 0700) Pulse Rate: 86 (12/02 0700)  Labs: Recent Labs    09/19/23 0512 09/20/23 0525 09/21/23 0425 09/21/23 0427  HGB 9.2*  --   --   --   HCT 30.0*  --   --   --   PLT 156  --   --   --   LABPROT 26.3* 28.8* 29.1*  --   INR 2.4* 2.7* 2.7*  --   CREATININE 1.43* 1.45*  --  1.51*    Estimated Creatinine Clearance: 33.7 mL/min (A) (by C-G formula based on SCr of 1.51 mg/dL (H)).   Medical History: Past Medical History:  Diagnosis Date   Anxiety disorder    Aortic stenosis    Status post St. Jude mechanical AVR 2007   Asthma    Atrial fibrillation (HCC)    Carcinoid tumor of colon 2007   Chronic diastolic heart failure (HCC)    Coronary atherosclerosis of native coronary artery    Status post CABG 2007   GERD (gastroesophageal reflux disease)    History of colonoscopy 2003   Dr. 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 Jena Gauss, benign biopsies   RLS (restless legs syndrome)    Schatzki's ring    Assessment: Patient presented with SOB. Pharmacy consulted to dose warfarin in patient with atrial fibrillation and mechanical AVR. Goal INR is 1-2 per anticoagulation clinic notes. Patient's warfarin home dose in med rec listed as 4 mg on Tuesday and 3 mg AOD, however clinic note from 05/27/2023 points toward lower warfarin dose in the past. Patient unable to effectively communicate at this time.  INR appears very labile and patient's oral intake  is poor, thus increasing warfarin sensitivity. Vitamin K 5 mg IV given on 11/26.    Goal of Therapy:  INR 2-3 Monitor platelets by anticoagulation protocol: Yes   Plan:  Warfarin 1 mg x 1 Daily INR Continue to monitor for signs and symptoms of bleeding   Judeth Cornfield, PharmD Clinical Pharmacist 09/21/2023 7:55 AM

## 2023-09-21 NOTE — Plan of Care (Signed)

## 2023-09-21 NOTE — Progress Notes (Signed)
Rounding Note    Patient Name: [name] Date of Encounter: 09/21/2023  [facility] Cardiologist: Nona Dell, MD   Subjective   Pt laying in bed resting   Denies SOB   No CP   Inpatient Medications    Scheduled Meds:  bisoprolol  5 mg Oral Daily   budesonide (PULMICORT) nebulizer solution  1 mg Nebulization BID   Chlorhexidine Gluconate Cloth  6 each Topical Daily   DermaCerin   Topical BID   diltiazem  120 mg Oral Daily   feeding supplement  237 mL Oral BID BM   furosemide  20 mg Intravenous Daily   ipratropium  0.5 mg Nebulization Q6H   levalbuterol  0.63 mg Nebulization Q6H   levothyroxine  50 mcg Oral QAC breakfast   methylPREDNISolone (SOLU-MEDROL) injection  80 mg Intravenous Daily   mouth rinse  15 mL Mouth Rinse 4 times per day   pantoprazole  40 mg Oral Daily   polyethylene glycol  17 g Oral Daily   rOPINIRole  3 mg Oral BID   rosuvastatin  10 mg Oral Daily   senna-docusate  2 tablet Oral QHS   sodium chloride flush  3 mL Intravenous Q12H   warfarin  1 mg Oral ONCE-1600   Warfarin - Pharmacist Dosing Inpatient   Does not apply q1600   Continuous Infusions:  PRN Meds: acetaminophen (TYLENOL) oral liquid 160 mg/5 mL, guaiFENesin, LORazepam, ondansetron **OR** ondansetron (ZOFRAN) IV, mouth rinse, mouth rinse, oxyCODONE, sodium chloride, sodium chloride flush   Vital Signs    Vitals:   09/21/23 0732 09/21/23 0800 09/21/23 0839 09/21/23 0900  BP:  105/68  96/62  Pulse:  83  93  Resp:  (!) 24  (!) 21  Temp:   98.2 F (36.8 C)   TempSrc:   Oral   SpO2: 100% 97%  93%  Weight:      Height:        Intake/Output Summary (Last 24 hours) at 09/21/2023 1038 Last data filed at 09/21/2023 0800 Gross per 24 hour  Intake 843 ml  Output 700 ml  Net 143 ml      09/21/2023    5:49 AM 09/20/2023    5:00 AM 09/19/2023    5:00 AM  Last 3 Weights  Weight (lbs) 137 lb 9.1 oz 136 lb 11 oz 138 lb 3.7 oz  Weight (kg) 62.4 kg 62 kg 62.7 kg       Telemetry    Afib 80s - Personally Reviewed  ECG    N/a - Personally Reviewed  Physical Exam   GEN: No acute distress.   Neck: No JVD Cardiac: Irreg irreg  No S3   Crisp valve sounds   Respiratory: MOving air  No rales  GI: Soft, nontender, non-distended  MS: No edema;   Labs    High Sensitivity Troponin:   Recent Labs  Lab 09/09/23 0942 09/09/23 1210  TROPONINIHS 19* 20*     Chemistry Recent Labs  Lab 09/19/23 0512 09/20/23 0525 09/21/23 0427  NA 137 138 137  K 3.7 3.8 3.6  CL 90* 89* 89*  CO2 39* 39* 39*  GLUCOSE 88 104* 123*  BUN 53* 45* 40*  CREATININE 1.43* 1.45* 1.51*  CALCIUM 8.7* 8.6* 8.6*  ALBUMIN 3.4* 3.3* 3.2*  GFRNONAA 39* 39* 37*  ANIONGAP 8 10 9     Lipids No results for input(s): "CHOL", "TRIG", "HDL", "LABVLDL", "LDLCALC", "CHOLHDL" in the last 168 hours.  Hematology Recent Labs  Lab 09/17/23 0458 09/19/23 0512  WBC 4.1 7.0  RBC 3.17* 3.35*  HGB 8.6* 9.2*  HCT 29.0* 30.0*  MCV 91.5 89.6  MCH 27.1 27.5  MCHC 29.7* 30.7  RDW 16.3* 16.5*  PLT 161 156   Thyroid  No results for input(s): "TSH", "FREET4" in the last 168 hours.   BNP Recent Labs  Lab 09/19/23 0512  BNP 560.0*    DDimer No results for input(s): "DDIMER" in the last 168 hours.   Radiology    DG CHEST PORT 1 VIEW  Result Date: 09/21/2023 CLINICAL DATA:  Dyspnea. EXAM: PORTABLE CHEST 1 VIEW COMPARISON:  09/18/2023 FINDINGS: Stable cardiac enlargement. Improved bilateral pulmonary aeration with less prominent appearance of pleural effusions. Probable residual underlying interstitial edema. No pneumothorax. IMPRESSION: Improved bilateral pulmonary aeration with less prominent appearance of pleural effusions. Probable residual underlying interstitial edema. Electronically Signed   By: Irish Lack M.D.   On: 09/21/2023 09:35    Cardiac Studies   Echo   09/10/23   1. Left ventricular ejection fraction, by estimation, is 65 to 70%. The  left ventricle has normal  function. The left ventricle has no regional  wall motion abnormalities. There is mild left ventricular hypertrophy.  Left ventricular diastolic parameters  are indeterminate. There is the interventricular septum is flattened in  systole and diastole, consistent with right ventricular pressure and  volume overload.   2. Right ventricular systolic function is mildly reduced. The right  ventricular size is mildly enlarged. There is mildly elevated pulmonary  artery systolic pressure. The estimated right ventricular systolic  pressure is 43.5 mmHg.   3. Left atrial size was severely dilated.   4. Large, rounded echodensity (approximately 3.5 x 3.5 cm) noted adjacent  to atrial septum within region of left atrium - best seen in subcostal  view and to less degree in apical two chamber view. Similar finding noted  on prior study although somewhat  obscured by acoustic shadowing from AVR. Concern would be to exclude  atrial myxoma although could be off axis imaging of atrial wall. Mass not  described on standard chest CT. Consider TEE or cardiac CTA for further  evaluation if clinically stable.   5. Right atrial size was severely dilated.   6. The mitral valve is degenerative. Moderate mitral valve regurgitation.  Moderate mitral annular calcification.   7. Tricuspid valve regurgitation is moderate.   8. The aortic valve has been repaired/replaced. Aortic valve  regurgitation is not visualized. There is a 25 mm St. Jude mechanical  valve present in the aortic position. Aortic valve mean gradient measures  5.0 mmHg.   9. The inferior vena cava is dilated in size with <50% respiratory  variability, suggesting right atrial pressure of 15 mmHg.   Comparison(s): Prior images reviewed side by side.    Patient Profile     [name] is a 72 y.o. female with a hx of aortic stenosis s/p mechanical AVR and ascending aortic aneurysm repair, chronic HFpEF, CAD with prior CABG, CKD 3, permanent  afib, chronic respiratory failiure on 2 LNC who is being seen 09/14/2023 for the evaluation of SOB at the request of Dr Gwenlyn Perking.   Assessment & Plan    Acute on chronic HFpEF/Mild RV dysfunction with RV pressure/volume overload - 10/2022 echo: LVEF 65-70%  RV pressure and volume overload with D shaped septum, mild RV dysfunction, PASP 44, severe LAE, questionable LA mass - CXR likely pulmonary edema, +bilateral pleural effusions.BNP 849. Baseline weight from  last admit looks to be aournd 160 lbs, weight on admit 154 lbs   -had been on IV lasix 40mg  bid, last dose 11/22 AM Cr 1.17 on admission, up to 2.42 and thus diuretics stopped. I/Os incomplete. Last reds vest 33% suggesting euvolemic. Last CXR with improving HF pattern - BUN/CR 40/1.51 today   Hold lasix again    Follow up labs and exam in AM   Will need some daily dose of diuretic       2.History of mechanical AVR -08/2023 echo: normal functoning AVR - coumadin per pharmacy  INR 2.7 today     3. Possible LA mass  I have reviewed echo  Image 76 shows echo bright ? Mass  Only seen in subcostal view.   Unfortunately [facility] imaging did not include subcostal view  Cardiac CT would help define anatomy further    Can be done as outpt    4. AKI - followed by renal   5. COPD exacerbation/chronic home O2 - hypercapneic and hypoxia on ABG - nebs, steroids, abx per primary team   6. Permanent afib - she is on bispoprolol, diltiazem. Coumadin for anticoag in setting of afib with mechanical AVR      For questions or updates, please contact [facility] Please consult [email or web address] for contact info under        Signed, Dietrich Pates, MD  09/21/2023, 10:38 AM

## 2023-09-21 NOTE — Progress Notes (Signed)
   09/21/23 1050  Spiritual Encounters  Type of Visit Follow up  Care provided to: Pt not available  Reason for visit Routine spiritual support  OnCall Visit No   Chaplain attempted to visit with patient however she was sleeping at the time of visit.   Valerie Roys [facility] [phone]

## 2023-09-21 NOTE — Progress Notes (Signed)
Pt removed her oxygen and SP02 dropped. Pt did not recover quickly.  Pt was increased to 7LPM High Flow [place].

## 2023-09-21 NOTE — Progress Notes (Signed)
Pt.lifted oxygen off for moment to cough. Oxygen sat down to 55% with perfect waveform. RN into assess pt. (See flowsheet for specifics)  Dr.Courage, Marcelino Duster RT aware.  Michelle into assess pt.and oxygen level turned up to 7L.

## 2023-09-21 NOTE — Progress Notes (Signed)
Physical Therapy Treatment Patient Details Name: [name] MRN: [id number] DOB: May 01, 1951 Today's Date: 09/21/2023   History of Present Illness [name] is a 72 y.o. female with medical history significant of chronic diastolic heart failure, atrial fibrillation, history of aortic valve replacement (mechanical valve) chronically on Coumadin, history of COPD, chronic respiratory failure (on 2 L at baseline), gastroesophageal reflux disease, hypothyroidism, Paget's disease and restless leg syndrome; who presented to the hospital secondary to worsening shortness of breath.  Symptoms has been present for the last 3-4 days and worsening.  Family noticed decrease in care station capacity due to shortness of breath and also increased requirement for oxygen supplementation.  At home bronchodilator management has failed to improve symptoms.    PT Comments  Patient demonstrates fair/good return for completing BLE ROM/strengthening exercises while seated at bedside, able to take a few steps forward/backwards before having to sit due to fatigue and tolerated sitting up in chair with her son present in room after therapy.  Patient on 3 LPM during functional activities with SpO2 dropping from 97% to 84% when taking steps at bedside, but increased back to 96% after sitting in chair on 3 LPM O2 - MD/RN notified.  Patient will benefit from continued skilled physical therapy in hospital and recommended venue below to increase strength, balance, endurance for safe ADLs and gait.      If plan is discharge home, recommend the following: A little help with walking and/or transfers;A little help with bathing/dressing/bathroom;Help with stairs or ramp for entrance;Assistance with cooking/housework   Can travel by private vehicle     Yes  Equipment Recommendations  None recommended by PT    Recommendations for Other Services       Precautions / Restrictions Precautions Precautions:  Fall Restrictions Weight Bearing Restrictions: No     Mobility  Bed Mobility Overal bed mobility: Needs Assistance Bed Mobility: Supine to Sit     Supine to sit: Min assist     General bed mobility comments: increased time, labored movement    Transfers Overall transfer level: Needs assistance Equipment used: Rolling walker (2 wheels) Transfers: Sit to/from Stand, Bed to chair/wheelchair/BSC Sit to Stand: Min assist   Step pivot transfers: Min assist, Mod assist       General transfer comment: unsteady labored movement using RW    Ambulation/Gait Ambulation/Gait assistance: Mod assist Gait Distance (Feet): 10 Feet Assistive device: Rolling walker (2 wheels) Gait Pattern/deviations: Decreased step length - right, Decreased step length - left, Decreased stride length Gait velocity: slow     General Gait Details: tolerated a few steps forward/backward at bedside before having to sit due to c/o fatigue, on 3 LPM with SpO2 dropping from 97% to 84%   Stairs             Wheelchair Mobility     Tilt Bed    Modified Rankin (Stroke Patients Only)       Balance Overall balance assessment: Needs assistance Sitting-balance support: Feet supported, No upper extremity supported Sitting balance-Leahy Scale: Fair Sitting balance - Comments: fair/good seated at EOB   Standing balance support: Reliant on assistive device for balance, During functional activity, Bilateral upper extremity supported Standing balance-Leahy Scale: Poor Standing balance comment: fair/poor using RW                            Cognition Arousal: Alert Behavior During Therapy: WFL for tasks assessed/performed Overall Cognitive Status: Within  Functional Limits for tasks assessed                                          Exercises General Exercises - Lower Extremity Long Arc Quad: Seated, AROM, Strengthening, Both, 10 reps Hip Flexion/Marching: Seated, AROM,  Strengthening, Both, 10 reps Toe Raises: Seated, AROM, Strengthening, Both, 15 reps Heel Raises: Seated, AROM, Strengthening, Both, 15 reps    General Comments        Pertinent Vitals/Pain Pain Assessment Pain Assessment: No/denies pain    Home Living                          Prior Function            PT Goals (current goals can now be found in the care plan section) Acute Rehab PT Goals Patient Stated Goal: return home with family to assist PT Goal Formulation: With patient Time For Goal Achievement: 10/01/23 Potential to Achieve Goals: Good Progress towards PT goals: Progressing toward goals    Frequency    Min 3X/week      PT Plan      Co-evaluation              AM-PAC PT "6 Clicks" Mobility   Outcome Measure  Help needed turning from your back to your side while in a flat bed without using bedrails?: A Little Help needed moving from lying on your back to sitting on the side of a flat bed without using bedrails?: A Little Help needed moving to and from a bed to a chair (including a wheelchair)?: A Lot Help needed standing up from a chair using your arms (e.g., wheelchair or bedside chair)?: A Little Help needed to walk in hospital room?: A Lot Help needed climbing 3-5 steps with a railing? : A Lot 6 Click Score: 15    End of Session Equipment Utilized During Treatment: Oxygen Activity Tolerance: Patient tolerated treatment well Patient left: in chair;with call bell/phone within reach Nurse Communication: Mobility status PT Visit Diagnosis: Unsteadiness on feet (R26.81);Other abnormalities of gait and mobility (R26.89);Muscle weakness (generalized) (M62.81)     Time: 9604-5409 PT Time Calculation (min) (ACUTE ONLY): 27 min  Charges:    $Therapeutic Exercise: 8-22 mins $Therapeutic Activity: 8-22 mins PT General Charges $$ ACUTE PT VISIT: 1 Visit                     3:47 PM, 09/21/23 Ocie Bob, MPT Physical Therapist with  [facility] 336 [phone] office [phone] mobile phone

## 2023-09-21 NOTE — Progress Notes (Signed)
Palliative:  [name] is lying quietly in bed.  She appears chronically ill and frail.  She is resting comfortably, but wakes easily when I enter.  She greets me, making and somewhat keeping eye contact.  She is alert and oriented, able to make her needs known.  There is no family at bedside at this time.  We talked about her acute health concerns, her respiratory status.  Overall, she tells me that she feels she is improving.  We talk about her functional status.  I ask if it would surprise her if she needed short-term rehab.  She states that this would surprise her, but endorses that she is too weak to care for herself at home.  I encouraged her to continue to complete bed exercise as possible.  Face-to-face conference with bedside nursing staff related to patient condition, needs.  Conference with attending, bedside nursing staff, transition of care team related to patient condition, needs, goals of care, disposition.  Plan:   At this point continue to treat the treatable but no CPR or intubation.  Time for outcomes.  Anticipate need for short-term rehab with ultimate goal of returning home.  Previous discussions related to palliative and hospice care.  50 minutes  Lillia Carmel, NP Palliative Medicine Team  Team phone [phone]

## 2023-09-22 DIAGNOSIS — Z515 Encounter for palliative care: Secondary | ICD-10-CM | POA: Diagnosis not present

## 2023-09-22 DIAGNOSIS — J9621 Acute and chronic respiratory failure with hypoxia: Secondary | ICD-10-CM | POA: Diagnosis not present

## 2023-09-22 DIAGNOSIS — Z7189 Other specified counseling: Secondary | ICD-10-CM | POA: Diagnosis not present

## 2023-09-22 DIAGNOSIS — I5033 Acute on chronic diastolic (congestive) heart failure: Secondary | ICD-10-CM | POA: Diagnosis not present

## 2023-09-22 LAB — PROTIME-INR
INR: 2.6 — ABNORMAL HIGH (ref 0.8–1.2)
Prothrombin Time: 28.4 s — ABNORMAL HIGH (ref 11.4–15.2)

## 2023-09-22 LAB — RENAL FUNCTION PANEL
Albumin: 3.2 g/dL — ABNORMAL LOW (ref 3.5–5.0)
Anion gap: 8 (ref 5–15)
BUN: 42 mg/dL — ABNORMAL HIGH (ref 8–23)
CO2: 39 mmol/L — ABNORMAL HIGH (ref 22–32)
Calcium: 8.7 mg/dL — ABNORMAL LOW (ref 8.9–10.3)
Chloride: 90 mmol/L — ABNORMAL LOW (ref 98–111)
Creatinine, Ser: 1.43 mg/dL — ABNORMAL HIGH (ref 0.44–1.00)
GFR, Estimated: 39 mL/min — ABNORMAL LOW (ref >60)
Glucose, Bld: 95 mg/dL (ref 70–99)
Phosphorus: 2.5 mg/dL (ref 2.5–4.6)
Potassium: 3.7 mmol/L (ref 3.5–5.1)
Sodium: 137 mmol/L (ref 135–145)

## 2023-09-22 MED ORDER — WARFARIN SODIUM 2 MG PO TABS
2.0000 mg | ORAL_TABLET | Freq: Once | ORAL | Status: AC
  Administered 2023-09-22: 2 mg via ORAL
  Filled 2023-09-22: qty 1

## 2023-09-22 MED ORDER — BOOST PLUS PO LIQD
237.0000 mL | Freq: Two times a day (BID) | ORAL | Status: DC
  Administered 2023-09-22: 237 mL via ORAL
  Filled 2023-09-22 (×5): qty 237

## 2023-09-22 MED ORDER — FUROSEMIDE 10 MG/ML IJ SOLN
40.0000 mg | Freq: Every day | INTRAMUSCULAR | Status: DC
  Administered 2023-09-22 – 2023-09-23 (×2): 40 mg via INTRAVENOUS
  Filled 2023-09-22 (×2): qty 4

## 2023-09-22 MED ORDER — LEVALBUTEROL HCL 0.63 MG/3ML IN NEBU
0.6300 mg | INHALATION_SOLUTION | Freq: Three times a day (TID) | RESPIRATORY_TRACT | Status: DC
  Administered 2023-09-22 – 2023-09-23 (×4): 0.63 mg via RESPIRATORY_TRACT
  Filled 2023-09-22 (×4): qty 3

## 2023-09-22 MED ORDER — IPRATROPIUM BROMIDE 0.02 % IN SOLN
0.5000 mg | Freq: Three times a day (TID) | RESPIRATORY_TRACT | Status: DC
  Administered 2023-09-22 – 2023-09-23 (×4): 0.5 mg via RESPIRATORY_TRACT
  Filled 2023-09-22 (×4): qty 2.5

## 2023-09-22 MED ORDER — ADULT MULTIVITAMIN W/MINERALS CH
1.0000 | ORAL_TABLET | Freq: Every day | ORAL | Status: DC
  Administered 2023-09-22 – 2023-09-23 (×2): 1 via ORAL
  Filled 2023-09-22 (×2): qty 1

## 2023-09-22 NOTE — Progress Notes (Signed)
   09/22/23 1246  Assess: MEWS Score  BP 100/65  MAP (mmHg) 77  Pulse Rate 69  Resp (!) 35  Level of Consciousness Alert  SpO2 98 %  O2 Device HFNC  Patient Activity (if Appropriate) In bed  O2 Flow Rate (L/min) 3 L/min  Assess: MEWS Score  MEWS Temp 0  MEWS Systolic 1  MEWS Pulse 0  MEWS RR 2  MEWS LOC 0  MEWS Score 3  MEWS Score Color Yellow  Assess: if the MEWS score is Yellow or Red  Were vital signs accurate and taken at a resting state? Yes  Does the patient meet 2 or more of the SIRS criteria? No  MEWS guidelines implemented  Yes, yellow  Treat  MEWS Interventions Considered administering scheduled or prn medications/treatments as ordered  Take Vital Signs  Increase Vital Sign Frequency  Yellow: Q2hr x1, continue Q4hrs until patient remains green for 12hrs  Escalate  MEWS: Escalate Yellow: Discuss with charge nurse and consider notifying provider and/or RRT  Notify: Charge Nurse/RN  Name of Charge Nurse/RN Notified Jiles Crocker, RN  Provider Notification  Provider Name/Title Shon Hale, MD  Date Provider Notified 09/22/23  Time Provider Notified 1524  Method of Notification Page (secure chat)  Notification Reason Other (Comment) (Yellow MEWS)  Assess: SIRS CRITERIA  SIRS Temperature  0  SIRS Pulse 0  SIRS Respirations  1  SIRS WBC 0  SIRS Score Sum  1

## 2023-09-22 NOTE — Progress Notes (Signed)
Mobility Specialist Progress Note:    09/22/23 1345  Mobility  Activity Refused mobility   Pt politely refused mobility, stated "I'm just not up to it today". All needs met.   [name] Mobility Specialist Please contact via Special educational needs teacher or  Rehab office at [phone]

## 2023-09-22 NOTE — Progress Notes (Signed)
Initial Nutrition Assessment  DOCUMENTATION CODES:   Non-severe (moderate) malnutrition in context of chronic illness  INTERVENTION:   Vanilla Boost Plus PO BID, each supplement provides 360 kcal and 14 gm protein. Vanilla Magic cup TID with meals, each supplement provides 290 kcal and 9 grams of protein. MVI with minerals daily.  NUTRITION DIAGNOSIS:   Moderate Malnutrition related to chronic illness (COPD, CHF) as evidenced by mild muscle depletion, moderate muscle depletion, mild fat depletion, percent weight loss (14% weight loss x 3 months).  GOAL:   Patient will meet greater than or equal to 90% of their needs  MONITOR:   PO intake, Supplement acceptance  REASON FOR ASSESSMENT:   Malnutrition Screening Tool    ASSESSMENT:   72 yo female admitted with SOB r/t COPD exacerbation and CHF exacerbation. PMH includes HLD, CABG, A fib, anxiety, GERD, RLS, peptic stricture of esophagus/GE junction, CHF, asthma, COPD.  Patient states that she has had a poor appetite for the past few months. She is feeling better and thinks appetite has improved over the past few days. She likes vanilla Boost supplements and agreed to try vanilla magic cups with meals.   Currently on a heart healthy diet, meal intakes 0-50%, averaging 18% for the past 8 meals documented. Meal intakes are improving.   Labs reviewed.   Medications reviewed and include lasix, solumedrol, miralax, senokot-s.  Weight history reviewed.  Patient with 14% weight loss in the past 3 months. Patient meets criteria for moderate malnutrition, given mild-moderate depletion of muscle and subcutaneous fat mass.  NUTRITION - FOCUSED PHYSICAL EXAM:  Flowsheet Row Most Recent Value  Orbital Region Mild depletion  Upper Arm Region Moderate depletion  Thoracic and Lumbar Region Mild depletion  Buccal Region Mild depletion  Temple Region Mild depletion  Clavicle Bone Region Mild depletion  Clavicle and Acromion Bone Region  Mild depletion  Scapular Bone Region Unable to assess  Dorsal Hand Moderate depletion  Patellar Region Moderate depletion  Anterior Thigh Region Moderate depletion  Posterior Calf Region Mild depletion  Edema (RD Assessment) Mild  Hair Reviewed  Eyes Reviewed  Mouth Reviewed  Skin Reviewed  Nails Reviewed       Diet Order:   Diet Order             Diet Heart Room service appropriate? Yes; Fluid consistency: Thin  Diet effective now                   EDUCATION NEEDS:   Not appropriate for education at this time  Skin:  Skin Assessment: Skin Integrity Issues: Skin Integrity Issues:: Stage II, Other (Comment) Stage II: nose from BiPAP mask Other: pretibial venous stasis ulcer; L breast wound  Last BM:  12/2  Height:   Ht Readings from Last 1 Encounters:  09/16/23 5\' 8"  (1.727 m)    Weight:   Wt Readings from Last 1 Encounters:  09/22/23 66.5 kg    BMI:  Body mass index is 22.29 kg/m.  Estimated Nutritional Needs:   Kcal:  1700-1900  Protein:  85-100 gm  Fluid:  1.7-1.9 L   Gabriel Rainwater RD, LDN, CNSC Please refer to Amion for contact information.

## 2023-09-22 NOTE — Progress Notes (Signed)
Palliative: [name] is lying quietly in bed.  She appears chronically ill and somewhat frail.  She greets me, making and mostly keeping eye contact.  She is alert and oriented, able to make her needs known.  There is no family at bedside at this time.  We talk about [name]'s acute health concerns and her improvements.  We talk about plan for short-term rehab, that she has been accepted to [facility].  We talk about short-term rehab with ultimate goal of returning home.  I, again today, greatly encouraged her to continue to exercise.  We talked about the benefits of outpatient palliative services for continued support.  As I am leaving she asked that I turn off the light.  I reminded [name] the importance of staying awake through the day, participating in exercise.  She states understanding and agreement.  Conference with attending, bedside nursing staff, transition of care team related to patient condition, needs, goals of care, disposition.  Plan: Continue to treat the treatable but no CPR or intubation.  Time for outcomes.  Short-term rehab at [facility] with the ultimate goal of returning home.  35 minutes Lillia Carmel, NP Palliative medicine team Team phone [phone]

## 2023-09-22 NOTE — Progress Notes (Signed)
PHARMACY - ANTICOAGULATION CONSULT NOTE  Pharmacy Consult for Coumadin Indication:  aortic mechanical valve    Allergies  Allergen Reactions   Penicillin G Itching and Other (See Comments)   Penicillins Hives, Itching, Swelling and Rash    Patient Measurements: Height: 5\' 8"  (172.7 cm) Weight: 66.5 kg (146 lb 9.7 oz) IBW/kg (Calculated) : 63.9   Vital Signs: Temp: 98.1 F (36.7 C) (12/03 0400) Temp Source: Oral (12/03 0400) BP: 107/56 (12/03 0700) Pulse Rate: 99 (12/03 0700)  Labs: Recent Labs    09/20/23 0525 09/21/23 0425 09/21/23 0427 09/22/23 0412  LABPROT 28.8* 29.1*  --  28.4*  INR 2.7* 2.7*  --  2.6*  CREATININE 1.45*  --  1.51* 1.43*    Estimated Creatinine Clearance: 36.4 mL/min (A) (by C-G formula based on SCr of 1.43 mg/dL (H)).   Medical History: Past Medical History:  Diagnosis Date   Anxiety disorder    Aortic stenosis    Status post St. Jude mechanical AVR 2007   Asthma    Atrial fibrillation (HCC)    Carcinoid tumor of colon 2007   Chronic diastolic heart failure (HCC)    Coronary atherosclerosis of native coronary artery    Status post CABG 2007   GERD (gastroesophageal reflux disease)    History of colonoscopy 2003   Dr. 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 Jena Gauss, benign biopsies   RLS (restless legs syndrome)    Schatzki's ring    Assessment: Patient presented with SOB. Pharmacy consulted to dose warfarin in patient with atrial fibrillation and mechanical AVR. Goal INR is 1-2 per anticoagulation clinic notes. Patient's warfarin home dose in med rec listed as 4 mg on Tuesday and 3 mg AOD, however clinic note from 05/27/2023 points toward lower warfarin dose in the past. Patient unable to effectively communicate at this time.  INR appears very labile and patient's oral intake is poor, thus increasing warfarin sensitivity. Vitamin K 5 mg IV given on  11/26.   INR 2.6  Goal of Therapy:  INR 2-3 Monitor platelets by anticoagulation protocol: Yes   Plan:  Warfarin 2 mg x 1 Daily INR Continue to monitor for signs and symptoms of bleeding   Judeth Cornfield, PharmD Clinical Pharmacist 09/22/2023 7:52 AM

## 2023-09-22 NOTE — Progress Notes (Signed)
Progress Note  Patient: [name] YQI:[id number] DOB: 1951-04-18 DOA: 09/09/2023     13 DOS: the patient was seen and examined on 09/22/2023   Brief hospital course: TALIBAH MALLINSON is a 72 y.o. female with medical history significant of chronic diastolic heart failure, atrial fibrillation, history of aortic valve replacement (mechanical valve) chronically on Coumadin, history of COPD, chronic respiratory failure (on 2 L at baseline), gastroesophageal reflux disease, hypothyroidism, Paget's disease and restless leg syndrome; who presented to the hospital secondary to worsening shortness of breath.  Symptoms has been present for the last 3-4 days and worsening.  Family noticed decrease in care station capacity due to shortness of breath and also increased requirement for oxygen supplementation.  At home bronchodilator management has failed to improve symptoms.   Patient reports dry coughing spells, increased wheezing and decreased appetite.  No fever, no chest pain, no abdominal pain, no dysuria, no hematuria, no sick contacts, no focal weaknesses or any other complaints.   Per family at bedside patient actively seeing for positive left nipple changes and breast discharge.   Nebulizer management, IV antibiotics and IV Lasix initiated at presentation.  COVID PCR negative.   CT chest: Demonstrating moderate cardiomegaly, enlarged pulmonary arteries suggesting pulmonary hypertension and the presence of mild bilateral pleural effusion along with atelectasis/bronchitic changes on the adjacent lower lobes.  Mild bilateral pulmonary edema appreciated.  09/22/23 -Patient has improved from a cardiopulmonary standpoint -Patient is medically ready for discharge to SNF rehab when SNF bed and insurance authorization can be obtained -Patient should Not need BiPAP or CPAP at discharge to facility -Okay to use CPAP/BiPAP  while here nightly if patient desires  Assessment and Plan: 1-Acute on chronic  respiratory failure with hypoxia and hypercapnia -Appears to be multifactorial in the setting of COPD exacerbation and CHF exacerbation with underlying group II and III pulmonary hypertension -At baseline usually uses 3 and half liters of oxygen via nasal cannula prior to admission 09/22/23 -Hypoxia improving slowly currently weaned down to 4L of oxygen via nasal cannula -Dyspnea with ambulation -No chest pains or productive cough -Completed doxycycline Repeat chest x-ray on 09/18/2023 with congestive heart failure/pulm edema -Repeat chest x-ray on 09/21/2023--with much improved CHF Continue  IV Lasix  restarted on 09/18/2023-- - transition to oral Lasix at discharge -Monitor I's and O's and daily weights REds Vest -Anticipate the patient will Not need BiPAP or CPAP at discharge - 2--AKI----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 -TSH within normal limits.  5-Gastroesophageal flux disease -Continue Protonix  6-COPD exacerbation Hypoxia and oxygen requirement improving as noted above -See #1 above  7-acute on chronic diastolic heart failure ---Echo from 09/10/2023 with EF of 65 to 70% , There is the interventricular septum is flattened in systole and diastole, consistent with right ventricular pressure and  volume overload -Repeat chest x-ray on 09/18/2023 with congestive heart failure/pulm edema -Restart IV Lasix 40 mg daily on 09/18/2023 -Repeat chest x-ray on 09/21/2023--with much improved CHF -Monitor  I's and O's and daily weights REds Vest -Cardiology consult appreciated -Please see #1 above  8-s/p  mechanical Aortic  heart valve replacement -Echo from 09/10/2023 shows-25 mm St. Jude mechanical valve present in the aortic position. Aortic valve mean gradient measures 5.0 mmHg.  -Continue Coumadin per pharmacy.  9-hyperlipidemia -Continue Crestor.  10-history of chronic atrial fibrillation -Continue Cardizem and bisoprolol for rate control C/n coumadin for stroke prophylaxis  11-Dysphagia -Speech Eval 09/11/23--pt has moderate aspiration risk -Rec Regular solids and thin Liquids  12)Lt Nipple Discharge/Lesion----prior mammogram and chest imaging study without definite evidence for breast cancer -Patient was scheduled for possible punch biopsy of the left nipple lesion due to concerns about possible Paget's disease of the breast -See photos in epic  13)Possible LA mass - unclear etiology, not stable for additional imaging at this time.  -Cardiology team recommends possible cardiac CT as outpatient when clinically stable  14)Social/Ethics--- plan of care and CODE STATUS/advanced directives discussed with patient and patient's son [name] previously -They request DNR DNI, No Limitations to treatment otherwise -Ongoing palliative care conversations at this time  Disposition:-Anticipate discharge to SNF rehab in 1 to 2 days with oxygen via nasal cannula -Patient may No longer be meeting criteria for LTAC  Subjective:  09/22/23 -Voiding well -Dyspnea on exertion noted -Oxygen requirement continues to improve -Patient has improved from a cardiopulmonary standpoint -Patient is medically ready for discharge to SNF rehab when SNF bed and insurance authorization can be obtained -Patient should Not need BiPAP or CPAP at discharge to facility -Okay to use CPAP/BiPAP  while here nightly if patient desires  Physical Exam: Vitals:   09/22/23 0950 09/22/23 1000 09/22/23 1100 09/22/23  1121  BP:  108/62 108/73   Pulse: 88 64 85 83  Resp: (!) 44 (!) 36 (!) 23 (!) 41  Temp:      TempSrc:      SpO2: 92% 96% 96% 97%  Weight:      Height:       Physical Exam Gen:- Awake Alert, dyspnea on exertion persist  HEENT:- [place].AT, No sclera icterus Nose- [place] 4L/min-  Neck-Supple Neck, +ve JVD,.  Lungs-improving air movement, no wheezing , sternotomy scar CV- S1, S2 normal, RRR, metallic valve click Abd-  +ve B.Sounds, Abd Soft, No tenderness,    Extremity/Skin: Chronic Venous Stasis, no significant pitting edema, good pedal pulses  Neuro-generalized weakness no new focal deficits, no tremors Psychiatry: Affect is flat, judgement and insight appear normal.   Family Communication: Son , [name] is primary contact.  Disposition: SNF when medically stable Status is: Inpatient  Author: Shon Hale, MD 09/22/2023 11:52 AM  For on call review [email or web address].

## 2023-09-22 NOTE — Progress Notes (Signed)
Patient placed on full face mask auto cpap. Patient has nasal cannula taped to face and wanted to leave in place under CPAP at this time.

## 2023-09-22 NOTE — Plan of Care (Signed)
  Problem: Education: Goal: Knowledge of General Education information will improve Description Including pain rating scale, medication(s)/side effects and non-pharmacologic comfort measures Outcome: Progressing   Problem: Health Behavior/Discharge Planning: Goal: Ability to manage health-related needs will improve Outcome: Progressing   

## 2023-09-22 NOTE — Progress Notes (Addendum)
Rounding Note    Patient Name: [name] Date of Encounter: 09/22/2023  [facility] Cardiologist: Nona Dell, MD   Subjective   Gradual improvement in her SOB  Inpatient Medications    Scheduled Meds:  bisoprolol  2.5 mg Oral Daily   budesonide (PULMICORT) nebulizer solution  1 mg Nebulization BID   Chlorhexidine Gluconate Cloth  6 each Topical Daily   DermaCerin   Topical BID   diltiazem  120 mg Oral Daily   feeding supplement  237 mL Oral BID BM   furosemide  40 mg Intravenous Daily   ipratropium  0.5 mg Nebulization TID   levalbuterol  0.63 mg Nebulization TID   levothyroxine  50 mcg Oral QAC breakfast   methylPREDNISolone (SOLU-MEDROL) injection  80 mg Intravenous Daily   mouth rinse  15 mL Mouth Rinse 4 times per day   pantoprazole  40 mg Oral Daily   polyethylene glycol  17 g Oral Daily   rOPINIRole  3 mg Oral BID   rosuvastatin  10 mg Oral Daily   senna-docusate  2 tablet Oral QHS   sodium chloride flush  3 mL Intravenous Q12H   warfarin  2 mg Oral ONCE-1600   Warfarin - Pharmacist Dosing Inpatient   Does not apply q1600   Continuous Infusions:  PRN Meds: acetaminophen (TYLENOL) oral liquid 160 mg/5 mL, guaiFENesin, LORazepam, ondansetron **OR** ondansetron (ZOFRAN) IV, mouth rinse, mouth rinse, oxyCODONE, sodium chloride, sodium chloride flush   Vital Signs    Vitals:   09/22/23 0700 09/22/23 0723 09/22/23 0800 09/22/23 0805  BP: (!) 107/56  (!) 94/56   Pulse: 99  79 63  Resp: 16  18 (!) 37  Temp:   98.3 F (36.8 C)   TempSrc:   Oral   SpO2: 100% 100% 99% 98%  Weight:      Height:        Intake/Output Summary (Last 24 hours) at 09/22/2023 0936 Last data filed at 09/22/2023 0424 Gross per 24 hour  Intake 923 ml  Output 800 ml  Net 123 ml      09/22/2023    4:26 AM 09/21/2023    5:49 AM 09/20/2023    5:00 AM  Last 3 Weights  Weight (lbs) 146 lb 9.7 oz 137 lb 9.1 oz 136 lb 11 oz  Weight (kg) 66.5 kg 62.4 kg 62 kg       Telemetry    Rate controlled afib - Personally Reviewed  ECG    N/a - Personally Reviewed  Physical Exam   GEN: No acute distress.   Neck: + JVD Cardiac: irreg, mechanical S2 Respiratory: decreased breath sounds bilateral bases GI: Soft, nontender, non-distended  MS: No edema; No deformity. Neuro:  Nonfocal  Psych: Normal affect   Labs    High Sensitivity Troponin:   Recent Labs  Lab 09/09/23 0942 09/09/23 1210  TROPONINIHS 19* 20*     Chemistry Recent Labs  Lab 09/20/23 0525 09/21/23 0427 09/22/23 0412  NA 138 137 137  K 3.8 3.6 3.7  CL 89* 89* 90*  CO2 39* 39* 39*  GLUCOSE 104* 123* 95  BUN 45* 40* 42*  CREATININE 1.45* 1.51* 1.43*  CALCIUM 8.6* 8.6* 8.7*  ALBUMIN 3.3* 3.2* 3.2*  GFRNONAA 39* 37* 39*  ANIONGAP 10 9 8     Lipids No results for input(s): "CHOL", "TRIG", "HDL", "LABVLDL", "LDLCALC", "CHOLHDL" in the last 168 hours.  Hematology Recent Labs  Lab 09/17/23 0458 09/19/23 0512  WBC 4.1  7.0  RBC 3.17* 3.35*  HGB 8.6* 9.2*  HCT 29.0* 30.0*  MCV 91.5 89.6  MCH 27.1 27.5  MCHC 29.7* 30.7  RDW 16.3* 16.5*  PLT 161 156   Thyroid No results for input(s): "TSH", "FREET4" in the last 168 hours.  BNP Recent Labs  Lab 09/19/23 0512  BNP 560.0*    DDimer No results for input(s): "DDIMER" in the last 168 hours.   Radiology    DG CHEST PORT 1 VIEW  Result Date: 09/21/2023 CLINICAL DATA:  Dyspnea. EXAM: PORTABLE CHEST 1 VIEW COMPARISON:  09/18/2023 FINDINGS: Stable cardiac enlargement. Improved bilateral pulmonary aeration with less prominent appearance of pleural effusions. Probable residual underlying interstitial edema. No pneumothorax. IMPRESSION: Improved bilateral pulmonary aeration with less prominent appearance of pleural effusions. Probable residual underlying interstitial edema. Electronically Signed   By: Irish Lack M.D.   On: 09/21/2023 09:35    Cardiac Studies       Patient Profile     [name] is a 72 y.o.  female with a hx of aortic stenosis s/p mechanical AVR and ascending aortic aneurysm repair, chronic HFpEF, CAD with prior CABG, CKD 3, permanent afib, chronic respiratory failiure on 2 LNC who is being seen 09/14/2023 for the evaluation of SOB at the request of Dr Gwenlyn Perking.   Assessment & Plan    Acute on chronic HFpEF/Mild RV dysfunction with RV pressure/volume overload - 10/2022 echo: LVEF 65-70%, no WMAs, indet diastolic function, RV pressure and volume overload with D shaped septum, mild RV dysfunction, PASP 44, severe LAE, questionable LA mass - CXR likely pulmonary edema, +bilateral pleural effusions.BNP 849. Baseline weight from last admit looks to be aournd 160 lbs, weight on admit 154 lbs   -had been on IV lasix 40mg  bid, last dose 11/22 AM Cr 1.17 on admission, up to 2.42 and thus diuretics stopped. - restarted on IV lasix 40mg  daily. INcomplete I/Os, weights appear inaccurate. Last reds vest 11/29 was 42%. Renal function trending down, cxr improved with mild residual edema.  - continue gradual diuresis, pending renal function perhaps trial of higher dose.     - history of chronic respiratory failure on home O2, HFrEF massively dilated LA with LAVI 123 suggest long standing severely elevated left sided pressures. 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 RV function with TAPSE 1.7, s' 0.9. Overall mildly decreased RV function      2.History of mechanical AVR -08/2023 echo: normal functoning AVR - coumadin per pharmacy   3. Possible LA mass - unclear etiology, not stable for additional imaging at this time.  - consider cardiac CT as outpatient when clinically stable   4. AKI - followed by renal   5. COPD exacerbation/chronic home O2 - hypercapneic and hypoxia on ABG - nebs, steroids, abx per primary team   6. Permanent afib - she is on bispoprolol, diltiazem. Coumadin for anticoag in setting of afib with mechanical AVR    For questions or updates, please  contact  [facility] Please consult [email or web address] for contact info under        Signed, Dina Rich, MD  09/22/2023, 9:36 AM

## 2023-09-22 NOTE — TOC Progression Note (Signed)
Transition of Care [facility]) - Progression Note    Patient Details  Name: [name] MRN: [id number] Date of Birth: 26-Jan-1951  Transition of Care [facility]) CM/SW Contact  Villa Herb, [place] Phone Number: 09/22/2023, 11:34 AM  Clinical Narrative:    Patients insurance auth for SNF at [facility] is still pending at this time. CSW updated Destiny with [facility], they will be ready to accept pt when INS auth completed. TOC to continue following.   Expected Discharge Plan: Home w Home Health Services Barriers to Discharge: Continued Medical Work up  Expected Discharge Plan and Services In-house Referral: Clinical Social Work Discharge Planning Services: CM Consult Post Acute Care Choice: Home Health Living arrangements for the past 2 months: Single Family Home                           HH Arranged: RN, PT HH Agency: [facility] Date [facility] Agency Contacted: 09/10/23   Representative spoke with at [facility] Agency: Maralyn Sago   Social Determinants of Health (SDOH) Interventions SDOH Screenings   Food Insecurity: No Food Insecurity (09/09/2023)  Housing: Low Risk  (09/09/2023)  Transportation Needs: No Transportation Needs (09/09/2023)  Utilities: Not At Risk (09/09/2023)  Financial Resource Strain: Low Risk  (07/28/2018)  Physical Activity: Inactive (07/28/2018)  Social Connections: Somewhat Isolated (07/28/2018)  Stress: No Stress Concern Present (07/28/2018)  Tobacco Use: Medium Risk (09/15/2023)  Health Literacy: Medium Risk (07/01/2023)   Received from [facility]    Readmission Risk Interventions    09/10/2023    2:02 PM  Readmission Risk Prevention Plan  Transportation Screening Complete  HRI or Home Care Consult Complete  Social Work Consult for Recovery Care Planning/Counseling Complete  Palliative Care Screening Not Applicable  Medication Review Oceanographer) Complete

## 2023-09-23 DIAGNOSIS — Z515 Encounter for palliative care: Secondary | ICD-10-CM | POA: Diagnosis not present

## 2023-09-23 DIAGNOSIS — Z7189 Other specified counseling: Secondary | ICD-10-CM | POA: Diagnosis not present

## 2023-09-23 DIAGNOSIS — J9621 Acute and chronic respiratory failure with hypoxia: Secondary | ICD-10-CM | POA: Diagnosis not present

## 2023-09-23 LAB — PROTIME-INR
INR: 2.4 — ABNORMAL HIGH (ref 0.8–1.2)
Prothrombin Time: 26.2 s — ABNORMAL HIGH (ref 11.4–15.2)

## 2023-09-23 LAB — RENAL FUNCTION PANEL
Albumin: 3.3 g/dL — ABNORMAL LOW (ref 3.5–5.0)
Anion gap: 7 (ref 5–15)
BUN: 40 mg/dL — ABNORMAL HIGH (ref 8–23)
CO2: 37 mmol/L — ABNORMAL HIGH (ref 22–32)
Calcium: 8.8 mg/dL — ABNORMAL LOW (ref 8.9–10.3)
Chloride: 91 mmol/L — ABNORMAL LOW (ref 98–111)
Creatinine, Ser: 1.4 mg/dL — ABNORMAL HIGH (ref 0.44–1.00)
GFR, Estimated: 40 mL/min — ABNORMAL LOW (ref >60)
Glucose, Bld: 86 mg/dL (ref 70–99)
Phosphorus: 2.1 mg/dL — ABNORMAL LOW (ref 2.5–4.6)
Potassium: 3.4 mmol/L — ABNORMAL LOW (ref 3.5–5.1)
Sodium: 135 mmol/L (ref 135–145)

## 2023-09-23 MED ORDER — ROPINIROLE HCL 3 MG PO TABS: 3.0000 mg | ORAL_TABLET | Freq: Two times a day (BID) | ORAL | 0 refills | Status: AC

## 2023-09-23 MED ORDER — ORAL CARE MOUTH RINSE: 15.0000 mL | Freq: Two times a day (BID) | OROMUCOSAL | 0 refills | Status: DC

## 2023-09-23 MED ORDER — METHYLPREDNISOLONE 4 MG PO TBPK: ORAL_TABLET | ORAL | 0 refills | Status: DC

## 2023-09-23 MED ORDER — OXYCODONE HCL 10 MG PO TABS: 10.0000 mg | ORAL_TABLET | ORAL | 0 refills | Status: AC | PRN

## 2023-09-23 MED ORDER — WARFARIN SODIUM 2 MG PO TABS: 2.0000 mg | ORAL_TABLET | Freq: Once | ORAL | Status: DC

## 2023-09-23 MED ORDER — ALPRAZOLAM 0.5 MG PO TABS: 0.5000 mg | ORAL_TABLET | Freq: Two times a day (BID) | ORAL | 0 refills | Status: DC

## 2023-09-23 NOTE — Progress Notes (Signed)
Palliative:    [name] is sitting on the edge of the bed in her room.  Overall, she looks improved.  She greets me, making but not keeping eye contact.  She is alert and oriented, able to make her needs known.  Her son/HCPOA, Marrian Salvage, is present at bedside along with nursing staff attending to needs.  [name] states that she had nausea during her breathing treatment last night.  She agrees that she did receive antiemetics with only partial relief.  I encouraged her to continue to ask for what she needs, especially as she is discharging to short-term rehab at [facility] today.  We talked about the benefits of outpatient palliative services.  I encouraged patient and family to consider these services, would they be beneficial.  Conference with attending, bedside nursing staff, and transition of care team related to patient condition, needs, goals of care, and disposition.    Plan: Continue to treat the treatable but no CPR or intubation.  Short-term rehab at [facility] with ultimate goal of returning home.  Outpatient palliative services encouraged.  35 minutes  Lillia Carmel, NP Palliative Medicine Team  Team phone [phone]

## 2023-09-23 NOTE — Care Management Important Message (Signed)
Important Message  Patient Details  Name: [name] MRN: [id number] Date of Birth: April 18, 1951   Important Message Given:  Yes - Medicare IM     Corey Harold 09/23/2023, 10:10 AM

## 2023-09-23 NOTE — Progress Notes (Signed)
Called report to Nurse Robin. Pt is en route to facility.

## 2023-09-23 NOTE — TOC Transition Note (Signed)
Transition of Care [facility]) - CM/SW Discharge Note   Patient Details  Name: [name] MRN: [id number] Date of Birth: December 01, 1950  Transition of Care [facility]) CM/SW Contact:  Villa Herb, LCSWA Phone Number: 09/23/2023, 10:48 AM   Clinical Narrative:    CSW updated that pts insurance Berkley Harvey has been approved. CSW spoke to Destiny with [facility] who states they are ready to accept pt. MD completed D/C. RN provided with room and report. CSW updated pts son, he is agreeable to [facility] [facility]. CSW called [facility], they will get pt at 11. TOC signing off.   Final next level of care: Skilled Nursing Facility Barriers to Discharge: Barriers Resolved   Patient Goals and CMS Choice CMS Medicare.gov Compare Post Acute Care list provided to:: Patient Represenative (must comment) Choice offered to / list presented to : Patient, Adult Children  Discharge Placement                  Patient to be transferred to facility by: [place] Name of family member notified: Son Patient and family notified of of transfer: 09/23/23  Discharge Plan and Services Additional resources added to the After Visit Summary for   In-house Referral: Clinical Social Work Discharge Planning Services: CM Consult Post Acute Care Choice: Home Health                    HH Arranged: RN, PT [facility] Agency: [facility] Date [facility] Agency Contacted: 09/10/23   Representative spoke with at [facility] Agency: Maralyn Sago  Social Determinants of Health (SDOH) Interventions SDOH Screenings   Food Insecurity: No Food Insecurity (09/09/2023)  Housing: Low Risk  (09/09/2023)  Transportation Needs: No Transportation Needs (09/09/2023)  Utilities: Not At Risk (09/09/2023)  Financial Resource Strain: Low Risk  (07/28/2018)  Physical Activity: Inactive (07/28/2018)  Social Connections: Somewhat Isolated (07/28/2018)  Stress: No Stress Concern Present (07/28/2018)  Tobacco Use: Medium Risk (09/15/2023)  Health Literacy: Medium Risk  (07/01/2023)   Received from [facility]     Readmission Risk Interventions    09/10/2023    2:02 PM  Readmission Risk Prevention Plan  Transportation Screening Complete  HRI or Home Care Consult Complete  Social Work Consult for Recovery Care Planning/Counseling Complete  Palliative Care Screening Not Applicable  Medication Review Oceanographer) Complete

## 2023-09-23 NOTE — Progress Notes (Signed)
PHARMACY - ANTICOAGULATION CONSULT NOTE  Pharmacy Consult for Coumadin Indication:  aortic mechanical valve    Allergies  Allergen Reactions   Penicillin G Itching and Other (See Comments)   Penicillins Hives, Itching, Swelling and Rash    Patient Measurements: Height: 5\' 8"  (172.7 cm) Weight: 61.6 kg (135 lb 12.9 oz) IBW/kg (Calculated) : 63.9   Vital Signs: Temp: 97.9 F (36.6 C) (12/04 0421) Temp Source: Oral (12/04 0421) BP: 109/68 (12/04 0421) Pulse Rate: 77 (12/04 0421)  Labs: Recent Labs    09/21/23 0425 09/21/23 0427 09/22/23 0412 09/23/23 0435  LABPROT 29.1*  --  28.4* 26.2*  INR 2.7*  --  2.6* 2.4*  CREATININE  --  1.51* 1.43* 1.40*    Estimated Creatinine Clearance: 35.8 mL/min (A) (by C-G formula based on SCr of 1.4 mg/dL (H)).   Medical History: Past Medical History:  Diagnosis Date   Anxiety disorder    Aortic stenosis    Status post [employer] mechanical AVR 2007   Asthma    Atrial fibrillation (HCC)    Carcinoid tumor of colon 2007   Chronic diastolic heart failure (HCC)    Coronary atherosclerosis of native coronary artery    Status post CABG 2007   GERD (gastroesophageal reflux disease)    History of colonoscopy 2003   Dr. 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 Jena Gauss, benign biopsies   RLS (restless legs syndrome)    Schatzki's ring    Assessment: Patient presented with SOB. Pharmacy consulted to dose warfarin in patient with atrial fibrillation and mechanical AVR.  Patient's warfarin home dose in med rec listed as 4 mg on Tuesday and 3 mg AOD, however clinic note from 05/27/2023 points toward lower warfarin dose in the past. Patient unable to effectively communicate at this time.  INR appears very labile and patient's oral intake is poor, thus increasing warfarin sensitivity. Vitamin K 5 mg IV given on 11/26.   INR 2.4- therapeutic  Goal of Therapy:   INR 2-3 Monitor platelets by anticoagulation protocol: Yes   Plan:  Warfarin 2 mg x 1 Daily INR Continue to monitor for signs and symptoms of bleeding   Judeth Cornfield, PharmD Clinical Pharmacist 09/23/2023 8:38 AM

## 2023-09-23 NOTE — Discharge Summary (Signed)
Physician Discharge Summary   Patient: [name] MRN: [id number] DOB: 05-06-1951  Admit date:     09/09/2023  Discharge date: 09/23/23  Discharge Physician: [name]   PCP: Amy Chesterfield, NP   Family Communication: Son , [name] is primary contact.   Recommendations at discharge:   Follow with the PCP in 1-2 weeks Follow-up with pulmonologist in 1-2 weeks  Follow-up with palliative care as an outpatient  Discharge Diagnoses: Principal Problem:   Acute on chronic respiratory failure with hypoxia (HCC) Active Problems:   Right tibial fracture   Hypothyroidism   Hyperlipidemia   Atrial fibrillation, chronic (HCC)   GERD   H/O heart valve replacement with mechanical valve   Acute on chronic diastolic congestive heart failure (HCC)   COPD with acute exacerbation (HCC)   CKD (chronic kidney disease) stage 3, GFR 30-59 ml/min (HCC)   Benign essential HTN   [name] is a 72 y.o. female with medical history significant of chronic diastolic heart failure, atrial fibrillation, history of aortic valve replacement (mechanical valve) chronically on Coumadin, history of COPD, chronic respiratory failure (on 2 L at baseline), gastroesophageal reflux disease, hypothyroidism, Paget's disease and restless leg syndrome; who presented to the hospital secondary to worsening shortness of breath.  Symptoms has been present for the last 3-4 days and worsening.  Family noticed decrease in care station capacity due to shortness of breath and also increased requirement for oxygen supplementation.  At home bronchodilator management has failed to improve symptoms.   Patient reports dry coughing spells, increased wheezing and decreased appetite.  No fever, no chest pain, no abdominal pain, no dysuria, no hematuria, no sick contacts, no focal weaknesses or any other complaints.   Per family at bedside patient actively seeing for positive left nipple changes and breast discharge.    Nebulizer management, IV antibiotics and IV Lasix initiated at presentation.  COVID PCR negative.   CT chest: Demonstrating moderate cardiomegaly, enlarged pulmonary arteries suggesting pulmonary hypertension and the presence of mild bilateral pleural effusion along with atelectasis/bronchitic changes on the adjacent lower lobes.  Mild bilateral pulmonary edema appreciated.   09/22/23- 09/23/23 -Patient has improved from a cardiopulmonary standpoint -Patient is medically ready for discharge to SNF rehab when SNF bed and insurance authorization can be obtained -Patient should Not need BiPAP or CPAP at discharge to facility -Okay to use CPAP/BiPAP  while here nightly if patient desires     1-Acute on chronic respiratory failure with hypoxia and hypercapnia -Appears to be multifactorial in the setting of COPD exacerbation and CHF exacerbation with underlying group II and III pulmonary hypertension -At baseline usually uses 3 and half liters of oxygen via nasal cannula prior to admission 09/22/23 -Hypoxia improving slowly currently weaned down to 4L of oxygen via nasal cannula -Dyspnea with ambulation -No chest pains or productive cough -Completed doxycycline Repeat chest x-ray on 09/18/2023 with congestive heart failure/pulm edema -Repeat chest x-ray on 09/21/2023--with much improved CHF Continue  IV Lasix  restarted on 09/18/2023-- - transition to oral home medication of Demadex on D/C  -Monitor I's and O's and daily weights REds Vest -Anticipate the patient will Not need BiPAP or CPAP at discharge - 2--AKI----acute kidney injury on CKD stage -3B -Creatinine peaked at 2.42 Baseline Creatinine---0.9 to 1.1  -Monitor creatinine closely as we are restarting IV Lasix on 09/18/2023 after initial diuretic holiday -Creatinine continues to trend down currently down to 1.43 -Nephrology consult appreciated, nephrology has since signed off -renally adjust  medications, avoid nephrotoxic agents /  dehydration  / hypotension   3-HTN -Continue Cardizem and bisoprolol--mostly for A-fib rate control   4-Hypothyroidism -Continue Synthroid. -TSH within normal limits.   5-Gastroesophageal flux disease -Continue Protonix   6-COPD exacerbation Hypoxia and oxygen requirement improving as noted above -See #1 above   7-acute on chronic diastolic heart failure ---Echo from 09/10/2023 with EF of 65 to 70% , There is the interventricular septum is flattened in systole and diastole, consistent with right ventricular pressure and  volume overload -Repeat chest x-ray on 09/18/2023 with congestive heart failure/pulm edema -Restart IV Lasix 40 mg daily on 09/18/2023 -Repeat chest x-ray on 09/21/2023--with much improved CHF -Monitor I's and O's and daily weights REds Vest -Cardiology consult appreciated -Please see #1 above   8-s/p  mechanical Aortic  heart valve replacement -Echo from 09/10/2023 shows-25 mm St. Jude mechanical valve present in the aortic position. Aortic valve mean gradient measures 5.0 mmHg.  -Continue Coumadin per pharmacy.   9-hyperlipidemia -Continue Crestor.   10-history of chronic atrial fibrillation -Continue Cardizem and bisoprolol for rate control C/n coumadin for stroke prophylaxis   11-Dysphagia -Speech Eval 09/11/23--pt has moderate aspiration risk -Rec Regular solids and thin Liquids   12)Lt Nipple Discharge/Lesion----prior mammogram and chest imaging study without definite evidence for breast cancer -Patient was scheduled for possible punch biopsy of the left nipple lesion due to concerns about possible Paget's disease of the breast -See photos in epic   13)Possible LA mass - unclear etiology, not stable for additional imaging at this time.  -Cardiology team recommends possible cardiac CT as outpatient when clinically stable   14)Social/Ethics--- plan of care and CODE STATUS/advanced directives discussed with patient and patient's son [name]  previously -They request DNR DNI, No Limitations to treatment otherwise -Ongoing palliative care conversations at this time   Disposition:-Anticipate discharge to SNF with oxygen via nasal cannula        Disposition: Skilled nursing facility Diet recommendation:  Discharge Diet Orders (From admission, onward)     Start     Ordered   09/23/23 0000  Diet - low sodium heart healthy        09/23/23 0900           Cardiac diet DISCHARGE MEDICATION: Allergies as of 09/23/2023       Reactions   Penicillin G Itching, Other (See Comments)   Penicillins Hives, Itching, Swelling, Rash        Medication List     STOP taking these medications    Caplyta 42 MG capsule Generic drug: lumateperone tosylate       TAKE these medications    ALPRAZolam 0.5 MG tablet Commonly known as: XANAX Take 1 tablet (0.5 mg total) by mouth 2 (two) times daily.   bisoprolol 5 MG tablet Commonly known as: ZEBETA Take 5 mg by mouth daily.   diltiazem 120 MG 24 hr capsule Commonly known as: CARDIZEM CD Take 1 capsule (120 mg total) by mouth daily.   Farxiga 10 MG Tabs tablet Generic drug: dapagliflozin propanediol Take 10 mg by mouth daily.   ipratropium 0.06 % nasal spray Commonly known as: ATROVENT Place 2 sprays into both nostrils in the morning and at bedtime.   levothyroxine 50 MCG tablet Commonly known as: SYNTHROID Take 50 mcg by mouth daily before breakfast.   methylPREDNISolone 4 MG Tbpk tablet Commonly known as: MEDROL DOSEPAK Medrol Dosepak take as instructed   mouth rinse Liqd solution 15 mLs by Mouth Rinse route in  the morning and at bedtime.   multivitamin with minerals Tabs tablet Take 1 tablet by mouth daily.   omeprazole 20 MG capsule Commonly known as: PRILOSEC Take 20 mg by mouth daily.   Oxycodone HCl 10 MG Tabs Take 1 tablet (10 mg total) by mouth every 4 (four) hours as needed for up to 3 days. What changed: reasons to take this    OXYGEN Inhale 4 L into the lungs daily.   oxymetazoline 0.05 % nasal spray Commonly known as: AFRIN Place 1 spray into both nostrils 2 (two) times daily as needed for congestion.   rOPINIRole 3 MG tablet Commonly known as: REQUIP Take 1 tablet (3 mg total) by mouth 2 (two) times daily.   rosuvastatin 20 MG tablet Commonly known as: CRESTOR TAKE ONE (1) TABLET BY MOUTH EVERY DAY   torsemide 100 MG tablet Commonly known as: DEMADEX Take 100 mg by mouth daily.   warfarin 2 MG tablet Commonly known as: COUMADIN Take as directed. If you are unsure how to take this medication, talk to your nurse or doctor. Original instructions: Take 1.5-2 tablets (3-4 mg total) by mouth See admin instructions. 4mg  on Tuesday and 3 mg daily the rest of the week.               Discharge Care Instructions  (From admission, onward)           Start     Ordered   09/23/23 0000  Discharge wound care:       Comments: Per wound care nursing instructions   09/23/23 0900            Discharge Exam: Filed Weights   09/21/23 0549 09/22/23 0426 09/23/23 0421  Weight: 62.4 kg 66.5 kg 61.6 kg       General:  4 L of oxygen, satting 100%, shortness of breath with exertion AAO x 3,  cooperative, no distress;   HEENT:  Normocephalic, PERRL, otherwise with in Normal limits   Neuro:  CNII-XII intact. , normal motor and sensation, reflexes intact   Lungs:   Clear to auscultation BL, Respirations unlabored,  No wheezes / crackles  Cardio:    S1/S2, RRR, No murmure, No Rubs or Gallops   Abdomen:  Soft, non-tender, bowel sounds active all four quadrants, no guarding or peritoneal signs.  Muscular  skeletal:  Limited exam -global generalized weaknesses - in bed, able to move all 4 extremities,   2+ pulses,  symmetric, chronic venous stasis, no significant pitting edema, good pedal pulse,  Skin:  Dry, warm to touch, negative for any Rashes,  Wounds: Please see nursing documentation  Pressure  Injury 09/16/23 Nose Medial;Upper Stage 2 -  Partial thickness loss of dermis presenting as a shallow open injury with a red, pink wound bed without slough. from BIPAP mask (Active)  09/16/23 2015  Location: Nose  Location Orientation: Medial;Upper  Staging: Stage 2 -  Partial thickness loss of dermis presenting as a shallow open injury with a red, pink wound bed without slough.  Wound Description (Comments): from BIPAP mask  Present on Admission: No  Dressing Type None 09/23/23 0100          Condition at discharge: fair  The results of significant diagnostics from this hospitalization (including imaging, microbiology, ancillary and laboratory) are listed below for reference.   Imaging Studies: DG CHEST PORT 1 VIEW  Result Date: 09/21/2023 CLINICAL DATA:  Dyspnea. EXAM: PORTABLE CHEST 1 VIEW COMPARISON:  09/18/2023 FINDINGS: Stable cardiac enlargement.  Improved bilateral pulmonary aeration with less prominent appearance of pleural effusions. Probable residual underlying interstitial edema. No pneumothorax. IMPRESSION: Improved bilateral pulmonary aeration with less prominent appearance of pleural effusions. Probable residual underlying interstitial edema. Electronically Signed   By: Irish Lack M.D.   On: 09/21/2023 09:35   DG CHEST PORT 1 VIEW  Result Date: 09/18/2023 CLINICAL DATA:  [id number] Dyspnea and respiratory abnormalities [id number] EXAM: PORTABLE CHEST 1 VIEW COMPARISON:  09/14/2023. FINDINGS: Redemonstration of increased interstitial markings, essentially similar to the prior study. There are probable bilateral layering pleural effusions. No significant interval change. No pneumothorax. There are bibasilar retrocardiac opacities partially obscuring the hemidiaphragms, which may represent combination of atelectasis and/or consolidation. Stable moderately enlarged cardio-mediastinal silhouette. No acute osseous abnormalities. The soft tissues are within normal limits. IMPRESSION:  *Persistent mild-to-moderate congestive heart failure/pulmonary edema. Electronically Signed   By: Jules Schick M.D.   On: 09/18/2023 08:15   US RENAL  Result Date: 09/14/2023 CLINICAL DATA:  72 year old female with acute renal insufficiency. EXAM: RENAL / URINARY TRACT ULTRASOUND COMPLETE COMPARISON:  Chest CT 09/09/2023. FINDINGS: Right Kidney: Renal measurements: 9.9 x 6.4 x 6.7 cm = volume: 222 mL. Renal cortical thinning and increased echogenicity (image 3). No right hydronephrosis or renal mass. Left Kidney: Renal measurements: 10.0 x 5.8 x 5.1 cm = volume: 152 mL. Renal cortical thinning and echogenicity (image 23). No left hydronephrosis or solid renal mass. Bladder: Appears normal for degree of bladder distention. No ureteral jets detected. Other: None. IMPRESSION: Chronic medical renal disease. No hydronephrosis or acute renal abnormality. Electronically Signed   By: Odessa Fleming M.D.   On: 09/14/2023 12:37   DG CHEST PORT 1 VIEW  Result Date: 09/14/2023 CLINICAL DATA:  Shortness of breath. Chronic diastolic heart failure, atrial fibrillation. EXAM: PORTABLE CHEST 1 VIEW COMPARISON:  09/09/2023 and CT chest 09/09/2023. FINDINGS: Trachea is midline. Heart is enlarged, stable. Thoracic aorta is calcified. Interstitial prominence and indistinctness, minimally improved from 09/09/2023. Partially loculated right pleural effusion, now small in size, decreased from 09/09/2023. Small left pleural effusion, stable. Probable left basilar atelectasis. IMPRESSION: 1. Improving congestive heart failure. 2. Probable left lower lobe atelectasis. Electronically Signed   By: Leanna Battles M.D.   On: 09/14/2023 10:47   ECHOCARDIOGRAM COMPLETE  Result Date: 09/10/2023    ECHOCARDIOGRAM REPORT   Patient Name:   [name] Date of Exam: 09/10/2023 Medical Rec #:  [id number]       Height:       68.0 in Accession #:    [id number]      Weight:       150.6 lb Date of Birth:  03/19/1951        BSA:          1.811 m  Patient Age:    72 years        BP:           100/59 mmHg Patient Gender: F               HR:           77 bpm. [facility] Procedure: 2D Echo, Cardiac Doppler, Color Doppler and Intracardiac            Opacification Agent Indications:    CHF-Acute Diastolic I50.31  History:        Patient has prior history of Echocardiogram examinations, most                 recent  10/31/2021. CHF, Arrythmias:Atrial Fibrillation; Risk                 Factors:Dyslipidemia, Former Smoker and Hypertension. Chronic                 anticoagulation, Status post St. Jude mechanical AVR 2007.                  Aortic Valve: 25 mm St. Jude mechanical valve is present in the                 aortic position.  Sonographer:    Celesta Gentile RCS Referring Phys: [id number] CARLOS MADERA  Sonographer Comments: Patient on BiPAP during echo. IMPRESSIONS  1. Left ventricular ejection fraction, by estimation, is 65 to 70%. The left ventricle has normal function. The left ventricle has no regional wall motion abnormalities. There is mild left ventricular hypertrophy. Left ventricular diastolic parameters are indeterminate. There is the interventricular septum is flattened in systole and diastole, consistent with right ventricular pressure and volume overload.  2. Right ventricular systolic function is mildly reduced. The right ventricular size is mildly enlarged. There is mildly elevated pulmonary artery systolic pressure. The estimated right ventricular systolic pressure is 43.5 mmHg.  3. Left atrial size was severely dilated.  4. Large, rounded echodensity (approximately 3.5 x 3.5 cm) noted adjacent to atrial septum within region of left atrium - best seen in subcostal view and to less degree in apical two chamber view. Similar finding noted on prior study although somewhat obscured by acoustic shadowing from AVR. Concern would be to exclude atrial myxoma although could be off axis imaging of atrial wall. Mass not described on standard chest  CT. Consider TEE or cardiac CTA for further evaluation if clinically stable.  5. Right atrial size was severely dilated.  6. The mitral valve is degenerative. Moderate mitral valve regurgitation. Moderate mitral annular calcification.  7. Tricuspid valve regurgitation is moderate.  8. The aortic valve has been repaired/replaced. Aortic valve regurgitation is not visualized. There is a 25 mm St. Jude mechanical valve present in the aortic position. Aortic valve mean gradient measures 5.0 mmHg.  9. The inferior vena cava is dilated in size with <50% respiratory variability, suggesting right atrial pressure of 15 mmHg. Comparison(s): Prior images reviewed side by side. FINDINGS  Left Ventricle: Left ventricular ejection fraction, by estimation, is 65 to 70%. The left ventricle has normal function. The left ventricle has no regional wall motion abnormalities. Definity contrast agent was given IV to delineate the left ventricular  endocardial borders. The left ventricular internal cavity size was normal in size. There is mild left ventricular hypertrophy. The interventricular septum is flattened in systole and diastole, consistent with right ventricular pressure and volume overload. Left ventricular diastolic function could not be evaluated due to atrial fibrillation. Left ventricular diastolic parameters are indeterminate. Right Ventricle: The right ventricular size is mildly enlarged. No increase in right ventricular wall thickness. Right ventricular systolic function is mildly reduced. There is mildly elevated pulmonary artery systolic pressure. The tricuspid regurgitant  velocity is 2.67 m/s, and with an assumed right atrial pressure of 15 mmHg, the estimated right ventricular systolic pressure is 43.5 mmHg. Left Atrium: Left atrial size was severely dilated. Right Atrium: Right atrial size was severely dilated. Pericardium: There is no evidence of pericardial effusion. Mitral Valve: The mitral valve is degenerative  in appearance. There is mild thickening of the mitral valve leaflet(s). There is mild calcification of the mitral valve leaflet(s).  Moderate mitral annular calcification. Moderate mitral valve regurgitation,  with posteriorly-directed jet. MV peak gradient, 12.5 mmHg. The mean mitral valve gradient is 3.0 mmHg. Tricuspid Valve: The tricuspid valve is grossly normal. Tricuspid valve regurgitation is moderate. Aortic Valve: The aortic valve has been repaired/replaced. Aortic valve regurgitation is not visualized. Aortic valve mean gradient measures 5.0 mmHg. Aortic valve peak gradient measures 10.9 mmHg. Aortic valve area, by VTI measures 1.39 cm. There is a 25 mm St. Jude mechanical valve present in the aortic position. Pulmonic Valve: The pulmonic valve was grossly normal. Pulmonic valve regurgitation is mild. Aorta: The aortic root is normal in size and structure. Venous: The inferior vena cava is dilated in size with less than 50% respiratory variability, suggesting right atrial pressure of 15 mmHg. IAS/Shunts: No atrial level shunt detected by color flow Doppler.  LEFT VENTRICLE PLAX 2D LVIDd:         4.70 cm LVIDs:         2.90 cm LV PW:         1.50 cm LV IVS:        1.10 cm LVOT diam:     1.70 cm LV SV:         34 LV SV Index:   19 LVOT Area:     2.27 cm  RIGHT VENTRICLE RV S prime:     9.36 cm/s TAPSE (M-mode): 1.7 cm LEFT ATRIUM              Index         RIGHT ATRIUM           Index LA diam:        6.80 cm  3.75 cm/m    RA Area:     34.70 cm LA Vol (A2C):   254.0 ml 140.22 ml/m  RA Volume:   135.00 ml 74.52 ml/m LA Vol (A4C):   196.0 ml 108.20 ml/m LA Biplane Vol: 223.0 ml 123.10 ml/m  AORTIC VALVE AV Area (Vmax):    1.22 cm AV Area (Vmean):   1.06 cm AV Area (VTI):     1.39 cm AV Vmax:           165.33 cm/s AV Vmean:          99.733 cm/s AV VTI:            0.243 m AV Peak Grad:      10.9 mmHg AV Mean Grad:      5.0 mmHg LVOT Vmax:         88.70 cm/s LVOT Vmean:        46.700 cm/s LVOT VTI:           0.149 m LVOT/AV VTI ratio: 0.61  AORTA Ao Root diam: 2.90 cm MITRAL VALVE                TRICUSPID VALVE MV Area (PHT): 4.80 cm     TR Peak grad:   28.5 mmHg MV Area VTI:   0.89 cm     TR Vmax:        267.00 cm/s MV Peak grad:  12.5 mmHg MV Mean grad:  3.0 mmHg     SHUNTS MV Vmax:       1.77 m/s     Systemic VTI:  0.15 m MV Vmean:      60.9 cm/s    Systemic Diam: 1.70 cm MV Decel Time: 158 msec MV E velocity: 136.00 cm/s Nona Dell MD Electronically signed by  Nona Dell MD Signature Date/Time: 09/10/2023/4:57:35 PM    Final    CT Chest W Contrast  Result Date: 09/09/2023 CLINICAL DATA:  Right pleural effusion. EXAM: CT CHEST WITH CONTRAST TECHNIQUE: Multidetector CT imaging of the chest was performed during intravenous contrast administration. RADIATION DOSE REDUCTION: This exam was performed according to the departmental dose-optimization program which includes automated exposure control, adjustment of the mA and/or kV according to patient size and/or use of iterative reconstruction technique. CONTRAST:  75mL OMNIPAQUE IOHEXOL 300 MG/ML  SOLN COMPARISON:  June 11, 2023. FINDINGS: Cardiovascular: Status post surgical repair of ascending thoracic aorta as well as aortic valve repair. No dissection is noted. Moderate cardiomegaly is noted. No pericardial effusion. Status post coronary artery bypass graft. Left atrial enlargement is noted. Enlarged pulmonary artery is noted suggesting pulmonary artery hypertension. Mediastinum/Nodes: No enlarged mediastinal, hilar, or axillary lymph nodes. Thyroid gland, trachea, and esophagus demonstrate no significant findings. Lungs/Pleura: Mild bilateral pleural effusions are noted with atelectasis of the adjacent lower lobes. No pneumothorax is noted. Mild reticular densities are noted in the lung bases suggesting possible pulmonary edema. Upper Abdomen: No acute abnormality. Musculoskeletal: Old left rib fractures are noted. No acute osseous abnormality is  noted. IMPRESSION: Mild bilateral pleural effusions are noted with atelectasis of the adjacent lower lobes. Possible mild bilateral pulmonary edema. Status post surgical repair of ascending thoracic aorta as well as aortic valve repair. Status post coronary artery bypass graft. Moderate cardiomegaly is noted with left atrial enlargement. Enlarged pulmonary arteries suggesting pulmonary artery hypertension. Aortic Atherosclerosis (ICD10-I70.0). Electronically Signed   By: Lupita Raider M.D.   On: 09/09/2023 15:21   DG Chest Port 1 View  Result Date: 09/09/2023 CLINICAL DATA:  Shortness of breath. EXAM: PORTABLE CHEST 1 VIEW COMPARISON:  06/20/2023. FINDINGS: Low lung volume. Mild-to-moderate pulmonary vascular congestion. There is moderate-to-large right and small-to-moderate left pleural effusion with associated underlying compressive atelectatic changes. No pneumothorax on either side. Evaluation of cardiomediastinal silhouette is nondiagnostic due to bilateral lower hemithoracic opacification. No acute osseous abnormalities. The soft tissues are within normal limits. IMPRESSION: *Findings favor congestive heart failure/pulmonary edema. There are bilateral pleural effusions, right more than left. Electronically Signed   By: Jules Schick M.D.   On: 09/09/2023 10:50   DG Knee 1-2 Views Right  Result Date: 08/30/2023 AP and lateral x-rays of the right knee were obtained in clinic.  These are compared to previous x-rays.  Lateral plate and screws remain in stable position.  Screws are not backing out.  The lateral plateau has not subsided.  Fracture reduction remains stable.  Neutral overall alignment.  No periprosthetic lucency.  Impression: Right tibial plateau fracture in stable alignment without hardware failure    Microbiology: Results for orders placed or performed during the hospital encounter of 09/09/23  Resp panel by RT-PCR (RSV, Flu A&B, Covid) Anterior Nasal Swab     Status: None    Collection Time: 09/09/23  9:35 AM   Specimen: Anterior Nasal Swab  Result Value Ref Range Status   SARS Coronavirus 2 by RT PCR NEGATIVE NEGATIVE Final    Comment: (NOTE) SARS-CoV-2 target nucleic acids are NOT DETECTED.  The SARS-CoV-2 RNA is generally detectable in upper respiratory specimens during the acute phase of infection. The lowest concentration of SARS-CoV-2 viral copies this assay can detect is 138 copies/mL. A negative result does not preclude SARS-Cov-2 infection and should not be used as the sole basis for treatment or other patient management decisions. A negative result  may occur with  improper specimen collection/handling, submission of specimen other than nasopharyngeal swab, presence of viral mutation(s) within the areas targeted by this assay, and inadequate number of viral copies(<138 copies/mL). A negative result must be combined with clinical observations, patient history, and epidemiological information. The expected result is Negative.  Fact Sheet for Patients:  BloggerCourse.com  Fact Sheet for Healthcare Providers:  SeriousBroker.it  This test is no t yet approved or cleared by the [place] FDA and  has been authorized for detection and/or diagnosis of SARS-CoV-2 by FDA under an Emergency Use Authorization (EUA). This EUA will remain  in effect (meaning this test can be used) for the duration of the COVID-19 declaration under Section 564(b)(1) of the Act, 21 U.S.C.section 360bbb-3(b)(1), unless the authorization is terminated  or revoked sooner.       Influenza A by PCR NEGATIVE NEGATIVE Final   Influenza B by PCR NEGATIVE NEGATIVE Final    Comment: (NOTE) The Xpert Xpress SARS-CoV-2/FLU/RSV plus assay is intended as an aid in the diagnosis of influenza from Nasopharyngeal swab specimens and should not be used as a sole basis for treatment. Nasal washings and aspirates are unacceptable for  Xpert Xpress SARS-CoV-2/FLU/RSV testing.  Fact Sheet for Patients: BloggerCourse.com  Fact Sheet for Healthcare Providers: SeriousBroker.it  This test is not yet approved or cleared by the [place] FDA and has been authorized for detection and/or diagnosis of SARS-CoV-2 by FDA under an Emergency Use Authorization (EUA). This EUA will remain in effect (meaning this test can be used) for the duration of the COVID-19 declaration under Section 564(b)(1) of the Act, 21 U.S.C. section 360bbb-3(b)(1), unless the authorization is terminated or revoked.     Resp Syncytial Virus by PCR NEGATIVE NEGATIVE Final    Comment: (NOTE) Fact Sheet for Patients: BloggerCourse.com  Fact Sheet for Healthcare Providers: SeriousBroker.it  This test is not yet approved or cleared by the [place] FDA and has been authorized for detection and/or diagnosis of SARS-CoV-2 by FDA under an Emergency Use Authorization (EUA). This EUA will remain in effect (meaning this test can be used) for the duration of the COVID-19 declaration under Section 564(b)(1) of the Act, 21 U.S.C. section 360bbb-3(b)(1), unless the authorization is terminated or revoked.  Performed at [facility], [address] [place], [place] [address]   MRSA Next Gen by PCR, Nasal     Status: None   Collection Time: 09/09/23 11:00 AM   Specimen: Nasal Mucosa; Nasal Swab  Result Value Ref Range Status   MRSA by PCR Next Gen NOT DETECTED NOT DETECTED Final    Comment: (NOTE) The GeneXpert MRSA Assay (FDA approved for NASAL specimens only), is one component of a comprehensive MRSA colonization surveillance program. It is not intended to diagnose MRSA infection nor to guide or monitor treatment for MRSA infections. Test performance is not FDA approved in patients less than 63 years old. Performed at [facility], [address],  [place], [place] [address]   Blood culture (routine x 2)     Status: None   Collection Time: 09/09/23 12:00 PM   Specimen: BLOOD  Result Value Ref Range Status   Specimen Description BLOOD BLOOD LEFT HAND  Final   Special Requests   Final    BOTTLES DRAWN AEROBIC AND ANAEROBIC Blood Culture results may not be optimal due to an excessive volume of blood received in culture bottles   Culture   Final    NO GROWTH 5 DAYS Performed at [facility], [address], [place], [place] [address]    Report Status 09/14/2023 FINAL  Final  Blood culture (routine x 2)     Status: None   Collection Time: 09/09/23 12:10 PM   Specimen: BLOOD  Result Value Ref Range Status   Specimen Description BLOOD BLOOD RIGHT ARM  Final   Special Requests   Final    BOTTLES DRAWN AEROBIC AND ANAEROBIC Blood Culture results may not be optimal due to an excessive volume of blood received in culture bottles   Culture   Final    NO GROWTH 5 DAYS Performed at [facility], [address], [place], [place] [address]    Report Status 09/14/2023 FINAL  Final    Labs: CBC: Recent Labs  Lab 09/17/23 0458 09/19/23 0512  WBC 4.1 7.0  HGB 8.6* 9.2*  HCT 29.0* 30.0*  MCV 91.5 89.6  PLT 161 156   Basic Metabolic Panel: Recent Labs  Lab 09/19/23 0512 09/20/23 0525 09/21/23 0427 09/22/23 0412 09/23/23 0435  NA 137 138 137 137 135  K 3.7 3.8 3.6 3.7 3.4*  CL 90* 89* 89* 90* 91*  CO2 39* 39* 39* 39* 37*  GLUCOSE 88 104* 123* 95 86  BUN 53* 45* 40* 42* 40*  CREATININE 1.43* 1.45* 1.51* 1.43* 1.40*  CALCIUM 8.7* 8.6* 8.6* 8.7* 8.8*  PHOS 2.1* 3.1 3.3 2.5 2.1*   Liver Function Tests: Recent Labs  Lab 09/19/23 0512 09/20/23 0525 09/21/23 0427 09/22/23 0412 09/23/23 0435  ALBUMIN 3.4* 3.3* 3.2* 3.2* 3.3*   CBG: No results for input(s): "GLUCAP" in the last 168 hours.  Discharge time spent: greater than 55 minutes.  Signed: Kendell Bane, MD [facility] 09/23/2023

## 2023-09-23 NOTE — Plan of Care (Signed)

## 2023-10-07 ENCOUNTER — Ambulatory Visit (INDEPENDENT_AMBULATORY_CARE_PROVIDER_SITE_OTHER): Payer: Medicare Other | Admitting: Orthopedic Surgery

## 2023-10-07 ENCOUNTER — Other Ambulatory Visit: Payer: Self-pay | Admitting: Orthopedic Surgery

## 2023-10-07 ENCOUNTER — Encounter: Payer: Self-pay | Admitting: Orthopedic Surgery

## 2023-10-07 ENCOUNTER — Other Ambulatory Visit (INDEPENDENT_AMBULATORY_CARE_PROVIDER_SITE_OTHER): Payer: Medicare Other

## 2023-10-07 DIAGNOSIS — Z8781 Personal history of (healed) traumatic fracture: Secondary | ICD-10-CM

## 2023-10-07 DIAGNOSIS — S82141D Displaced bicondylar fracture of right tibia, subsequent encounter for closed fracture with routine healing: Secondary | ICD-10-CM

## 2023-10-07 DIAGNOSIS — Z9889 Other specified postprocedural states: Secondary | ICD-10-CM | POA: Diagnosis not present

## 2023-10-07 NOTE — Progress Notes (Signed)
Orthopaedic Postop Note  Assessment: [name] is a 72 y.o. female s/p ORIF of right tibial plateau fracture with lateral meniscus repair  DOS: 06/18/2023  Plan: [name] has recovered well following surgery.  She is not having any pain in her right knee.  Radiographs remained stable.  She has good range of motion, from approximately 5-100 degrees.  Incision is healed up very nicely.  She states she is reluctant to ambulate, with the assistance of a walker, because she does not want to fall.  I have urged her to be more mobile, using the walker, and being careful.  She states understanding.  Anticipate that she will continue to improve and get stronger.  If she has any issues, she will contact the clinic.  Otherwise, follow-up as needed.   Follow-up: Return if symptoms worsen or fail to improve. XR at next visit: Right knee  Subjective:  Chief Complaint  Patient presents with   Routine Post Op    R knee DOS: 06/18/2023    History of Present Illness: [name] is a 72 y.o. female who presents following the above stated procedure.  Surgery was 3-4 months ago.  She was recently admitted to the hospital, with medical issues.  She is back home.  She is doing well.  She denies pain in her right knee.  She is able to walk using a walker.  She is reluctant.  No recent falls.  No issues with her incision  Review of Systems: No fevers or chills No numbness or tingling No Chest Pain No Shortness of breath   Objective: There were no vitals taken for this visit.  Physical Exam:  Alert and oriented.  No acute distress.  Nasal cannula in place, on 4 L of O2.  Seated in wheelchair.  Right leg surgical incision is fully healed.  Surrounding skin looks much better.  She does have some chronic ulceration distally, which is being treated.  Range of motion from 5-100 degrees.  No increased laxity to varus or valgus stress.  IMAGING: I personally ordered and reviewed the following  images:  AP and x-rays of the right knee were obtained clinic.  These are compared to prior images.  Hardware remains intact.  Screws not backing out.  No subsidence through the fracture.  No failure of the hardware.  No bony lesions.  Impression: Healed right tibial plateau fracture, without hardware failure   Oliver Barre, MD 10/07/2023 10:43 AM

## 2023-10-08 ENCOUNTER — Ambulatory Visit: Payer: Medicare Other | Attending: Cardiology | Admitting: Cardiology

## 2023-10-08 ENCOUNTER — Encounter: Payer: Self-pay | Admitting: Cardiology

## 2023-10-08 ENCOUNTER — Ambulatory Visit: Payer: Medicare Other | Admitting: Cardiology

## 2023-10-08 VITALS — BP 92/58 | HR 69 | Ht 68.0 in | Wt 139.2 lb

## 2023-10-08 DIAGNOSIS — Z952 Presence of prosthetic heart valve: Secondary | ICD-10-CM

## 2023-10-08 DIAGNOSIS — I25119 Atherosclerotic heart disease of native coronary artery with unspecified angina pectoris: Secondary | ICD-10-CM | POA: Diagnosis not present

## 2023-10-08 DIAGNOSIS — I4821 Permanent atrial fibrillation: Secondary | ICD-10-CM | POA: Diagnosis not present

## 2023-10-08 DIAGNOSIS — N1832 Chronic kidney disease, stage 3b: Secondary | ICD-10-CM

## 2023-10-08 DIAGNOSIS — I5032 Chronic diastolic (congestive) heart failure: Secondary | ICD-10-CM

## 2023-10-08 NOTE — Progress Notes (Signed)
Cardiology Office Note  Date: 10/08/2023   ID: NICHOLAS MAYWEATHER, DOB 02-18-1951, MRN [id number]  History of Present Illness: [name] is a 72 y.o. female last seen in June.  I reviewed interval records.  She was just recently discharged after hospitalization with acute on chronic hypoxic/hypercarbic respiratory failure.  This was felt to be multifactorial in the setting of COPD exacerbation and HFpEF.  She was followed by our cardiology team, repeat echocardiogram is noted below.  She is here today with her son, currently undergoing rehabilitation at the [facility] nursing center.  She is in a wheelchair today.  States that she has not regained her energy as yet.  She is on supplemental oxygen as before.  Does not describe any progressive leg edema.  Her weight is down significantly in comparison to 6 months ago.  I reviewed her medications.  Current cardiovascular regimen includes bisoprolol, Cardizem CD, Farxiga, Crestor, and Demadex.  She remains on Coumadin with follow-up in the anticoagulation clinic.  Recent INR 2.58.  I reviewed the results of her echocardiogram from November, we discussed this today.  She would not be a good candidate for a TEE given her chronic respiratory status.  We may be able to consider an outpatient cardiac CT once she gets out of rehab and is feeling better at home.  Physical Exam: VS:  BP (!) 92/58   Pulse 69   Ht 5\' 8"  (1.727 m)   Wt 139 lb 3.2 oz (63.1 kg)   SpO2 90%   BMI 21.17 kg/m , BMI Body mass index is 21.17 kg/m.  Wt Readings from Last 3 Encounters:  10/08/23 139 lb 3.2 oz (63.1 kg)  09/23/23 135 lb 12.9 oz (61.6 kg)  06/24/23 171 lb 4.8 oz (77.7 kg)    General: Wearing oxygen via nasal cannula. HEENT: Conjunctiva and lids normal. Neck: Supple, no elevated JVP. Lungs: Decreased breath sounds without active wheezing. Cardiac: Irregularly irregular, crisp prosthetic click in S2 with soft systolic murmur.. Extremities: Venous stasis with  chronic appearing edema.  ECG:  An ECG dated 09/09/2023 was personally reviewed today and demonstrated:  Atrial fibrillation with diffuse nonspecific ST-T changes.  Labwork: August 2023: Cholesterol 115, triglycerides 84, HDL 37, LDL 61  06/11/2023: ALT 20; AST 33 09/09/2023: Magnesium 2.9; TSH 2.652 09/19/2023: B Natriuretic Peptide 560.0; Hemoglobin 9.2; Platelets 156 09/23/2023: BUN 40; Creatinine, Ser 1.40; Potassium 3.4; Sodium 135   Other Studies Reviewed Today:  Echocardiogram 09/10/2023:  1. Left ventricular ejection fraction, by estimation, is 65 to 70%. The  left ventricle has normal function. The left ventricle has no regional  wall motion abnormalities. There is mild left ventricular hypertrophy.  Left ventricular diastolic parameters  are indeterminate. There is the interventricular septum is flattened in  systole and diastole, consistent with right ventricular pressure and  volume overload.   2. Right ventricular systolic function is mildly reduced. The right  ventricular size is mildly enlarged. There is mildly elevated pulmonary  artery systolic pressure. The estimated right ventricular systolic  pressure is 43.5 mmHg.   3. Left atrial size was severely dilated.   4. Large, rounded echodensity (approximately 3.5 x 3.5 cm) noted adjacent  to atrial septum within region of left atrium - best seen in subcostal  view and to less degree in apical two chamber view. Similar finding noted  on prior study although somewhat  obscured by acoustic shadowing from AVR. Concern would be to exclude  atrial myxoma although could be off  axis imaging of atrial wall. Mass not  described on standard chest CT. Consider TEE or cardiac CTA for further  evaluation if clinically stable.   5. Right atrial size was severely dilated.   6. The mitral valve is degenerative. Moderate mitral valve regurgitation.  Moderate mitral annular calcification.   7. Tricuspid valve regurgitation is moderate.    8. The aortic valve has been repaired/replaced. Aortic valve  regurgitation is not visualized. There is a 25 mm St. Jude mechanical  valve present in the aortic position. Aortic valve mean gradient measures  5.0 mmHg.   9. The inferior vena cava is dilated in size with <50% respiratory  variability, suggesting right atrial pressure of 15 mmHg.   Assessment and Plan:  1.  History of aortic stenosis status post 25 mm St. Jude mechanical AVR in 2007 with 30 mm Hemashield tube graft repair of ascending aortic aneurysm.  Follow-up echocardiogram in November showed stable mechanical prosthesis with mean AV gradient 5 mmHg and no aortic regurgitation.  She continues on Coumadin with follow-up in the anticoagulation clinic.   2.  HFpEF, LVEF 55 to 60%.  Recent hospitalization noted as described above, transiently on IV diuretics.  Fluid status improved with significant weight loss per review of records.  Plan to continue Demadex and [place].   3.  Multivessel CAD status post CABG in 2007 with RIMA to RCA and SVG to RCA.  No active angina.  LVEF 65 to 70%.  She is not on aspirin given use of Coumadin.  Continue Crestor.   4.  CKD stage IIIb.  Recent creatinine 1.4.   5.  Permanent atrial fibrillation with CHA2DS2-VASc score of 4.  She remains on Coumadin with follow-up in anticoagulation clinic.  No palpitations.  6.  Possible left atrial mass versus artifact as per above echocardiogram from November.  Further testing was not pursued during her recent hospitalization given clinical instability.  Would not be a good candidate for TEE given chronic pulmonary status.  Can consider cardiac CTA when she gets out of rehabilitation and is more clinically stable at home.  Disposition:  Follow up  4 to 6 weeks.  Signed, Jonelle Sidle, M.D., F.A.C.C. [facility] at [facility]

## 2023-10-08 NOTE — Patient Instructions (Addendum)
Medication Instructions:  Your physician recommends that you continue on your current medications as directed. Please refer to the Current Medication list given to you today.  Labwork: none  Testing/Procedures: none  Follow-Up: Your physician recommends that you schedule a follow-up appointment in: 4-6 weeks with Diona Browner or Philis Nettle  Any Other Special Instructions Will Be Listed Below (If Applicable).  If you need a refill on your cardiac medications before your next appointment, please call your pharmacy.

## 2023-11-06 ENCOUNTER — Ambulatory Visit: Payer: Medicare Other | Admitting: Nurse Practitioner

## 2024-02-26 ENCOUNTER — Ambulatory Visit: Attending: Nurse Practitioner | Admitting: Nurse Practitioner

## 2024-02-26 ENCOUNTER — Encounter: Payer: Self-pay | Admitting: Nurse Practitioner

## 2024-02-26 VITALS — BP 122/82 | HR 90 | Ht 68.0 in | Wt 139.6 lb

## 2024-02-26 DIAGNOSIS — Z8679 Personal history of other diseases of the circulatory system: Secondary | ICD-10-CM

## 2024-02-26 DIAGNOSIS — I38 Endocarditis, valve unspecified: Secondary | ICD-10-CM

## 2024-02-26 DIAGNOSIS — Z952 Presence of prosthetic heart valve: Secondary | ICD-10-CM

## 2024-02-26 DIAGNOSIS — I2510 Atherosclerotic heart disease of native coronary artery without angina pectoris: Secondary | ICD-10-CM

## 2024-02-26 DIAGNOSIS — R931 Abnormal findings on diagnostic imaging of heart and coronary circulation: Secondary | ICD-10-CM

## 2024-02-26 DIAGNOSIS — I5032 Chronic diastolic (congestive) heart failure: Secondary | ICD-10-CM | POA: Diagnosis not present

## 2024-02-26 DIAGNOSIS — Z9889 Other specified postprocedural states: Secondary | ICD-10-CM | POA: Diagnosis not present

## 2024-02-26 DIAGNOSIS — N1832 Chronic kidney disease, stage 3b: Secondary | ICD-10-CM

## 2024-02-26 DIAGNOSIS — I4821 Permanent atrial fibrillation: Secondary | ICD-10-CM

## 2024-02-26 NOTE — Patient Instructions (Addendum)
 Medication Instructions:  Your physician recommends that you continue on your current medications as directed. Please refer to the Current Medication list given to you today.   Labwork: Requested labs from Primary Care Physician   Testing/Procedures: None  Follow-Up: Your physician recommends that you schedule a follow-up appointment in: 3 months  Any Other Special Instructions Will Be Listed Below (If Applicable). Thank you for choosing [facility]!     If you need a refill on your cardiac medications before your next appointment, please call your pharmacy.

## 2024-02-26 NOTE — Progress Notes (Unsigned)
 Cardiology Office Note:  .   Date:  02/26/2024 ID:  Kemper Patterson, DOB May 18, 1951, MRN [id number] PCP: Lenn Quint, NP  [facility] Providers Cardiologist:  Teddie Favre, MD    History of Present Illness: .   [name] is a 73 y.o. female with a PMH of aortic valve stenosis, s/p mechanical AVR in 2007 with 38 mm Hemashield tube graft repair of ascending aortic aneurysm, HFpEF, CAD, s/p CABG in 2007, permanent A-fib, possible left atrial mass versus artifact, CKD stage IIIb, who presents today for hospital follow-up.  Last seen by Dr. Londa Rival on October 08, 2023.  She had been recently discharged from hospital with acute on chronic hypoxic/hypercarbic respiratory failure, felt to be multifactorial in setting of HFpEF and COPD exacerbation.  See echocardiogram report noted below.  Was currently undergoing rehab at [facility] nursing center.  Still noting weakness, denied any progressive leg edema.  Weight was down.  Was felt she would not be a good candidate for TEE given chronic respiratory status.  Recommend to possibly consider outpatient cardiac CT once patient was out of rehab and was feeling better at home.  Hospital admission in March 2025 for acute on chronic respiratory failure with hypoxia and hypercapnia, started on IV Lasix .  Workup revealed chest x-ray with small bilateral pleural effusions, associated bibasilar dependent compressive atelectasis, superimposed pneumonia not entirely excluded.  Noted to have cardiomegaly with moderate bilateral interstitial and alveolar pulmonary edema, likely CHF exacerbation.  proBNP 17,489.  Was on BiPAP during hospital stay.  Was referred to [facility] rehab for SNF placement.  Did well at SNF receiving OT/PT, PT and OT was arranged outpatient.  Today she presents for hospital follow-up.  She states she is doing much better since leaving the hospital. Now she is off bisoprolol  and now on metoprolol , tolerating well. She is on 3.5 L  of continuous oxygen . Tells me she has broken 4 ribs on left side, however I do not see recent imaging results to confirm this. Denies any chest pain, shortness of breath, palpitations, syncope, presyncope, dizziness, orthopnea, PND, swelling or significant weight changes, acute bleeding, or claudication. Had left knee operated on last fall.   ROS: Negative. See HPI.   Studies Reviewed: Aaron Aas    EKG: EKG is not ordered today.   Echo 08/2023:  1. Left ventricular ejection fraction, by estimation, is 65 to 70%. The  left ventricle has normal function. The left ventricle has no regional  wall motion abnormalities. There is mild left ventricular hypertrophy.  Left ventricular diastolic parameters  are indeterminate. There is the interventricular septum is flattened in  systole and diastole, consistent with right ventricular pressure and  volume overload.   2. Right ventricular systolic function is mildly reduced. The right  ventricular size is mildly enlarged. There is mildly elevated pulmonary  artery systolic pressure. The estimated right ventricular systolic  pressure is 43.5 mmHg.   3. Left atrial size was severely dilated.   4. Large, rounded echodensity (approximately 3.5 x 3.5 cm) noted adjacent  to atrial septum within region of left atrium - best seen in subcostal  view and to less degree in apical two chamber view. Similar finding noted  on prior study although somewhat  obscured by acoustic shadowing from AVR. Concern would be to exclude  atrial myxoma although could be off axis imaging of atrial wall. Mass not  described on standard chest CT. Consider TEE or cardiac CTA for further  evaluation if clinically  stable.   5. Right atrial size was severely dilated.   6. The mitral valve is degenerative. Moderate mitral valve regurgitation.  Moderate mitral annular calcification.   7. Tricuspid valve regurgitation is moderate.   8. The aortic valve has been repaired/replaced. Aortic  valve  regurgitation is not visualized. There is a 25 mm St. Jude mechanical  valve present in the aortic position. Aortic valve mean gradient measures  5.0 mmHg.   9. The inferior vena cava is dilated in size with <50% respiratory  variability, suggesting right atrial pressure of 15 mmHg.   Comparison(s): Prior images reviewed side by side.   Physical Exam:   VS:  BP 122/82   Pulse 90   Ht 5\' 8"  (1.727 m)   Wt 139 lb 9.6 oz (63.3 kg)   SpO2 96%   BMI 21.23 kg/m    Wt Readings from Last 3 Encounters:  02/26/24 139 lb 9.6 oz (63.3 kg)  10/08/23 139 lb 3.2 oz (63.1 kg)  09/23/23 135 lb 12.9 oz (61.6 kg)    GEN: Well nourished, well developed in no acute distress NECK: No JVD; No carotid bruits CARDIAC: S1/S2, RRR, no murmurs, rubs, gallops RESPIRATORY:  Clear to auscultation without rales, wheezing or rhonchi  ABDOMEN: Soft, non-tender, non-distended EXTREMITIES:  No edema; No deformity   ASSESSMENT AND PLAN: .    HFpEF Stage C, NYHA class I-III symptoms.  EF 65 to 70% in November 2024.  Euvolemic well compensated on exam.  No longer on bisoprolol  and taking metoprolol  and tolerating well.  No other medication changes at this time. Low sodium diet, fluid restriction <2L, and daily weights encouraged. Educated to contact our office for weight gain of 2 lbs overnight or 5 lbs in one week.   CAD, s/p CABG Stable with no anginal symptoms. No indication for ischemic evaluation.  Not on ASA d/t being on Coumadin . Continue Toprol  XL and rosuvastatin . Heart healthy diet and regular cardiovascular exercise encouraged.   Aortic valve stenosis, s/p mechanical AVR in 2007 with 38 mm Hemashield tube graft repair of ascending aortic aneurysm Normal prosthetic valve function seen on echocardiogram March 2025.  Aorta was found to be normal in size of visualized segments, graft present in ascending aorta. Will continue to monitor with regular surveillance.   Permanent A-fib Denies any  tachycardia or palpitations. HR is well controlled today.  Continue current medication regimen.  Continue follow-up with Coumadin  clinic.  PCP is managing INR checks.   Possible left atrial mass versus artifact, valvular insufficiency Most recent echo 12/2023 revealed severe posteriorly direct MR with moderate TR, also noted to have moderate pulmonary HTN. There was a noted small, mobile echodensity noted on the ventricular side of the anterior mitral valve leaflet. Pt denies any symptoms. Will consult attending cardiologist regarding timing of repeating Echo.   Addendum: Case d/w with her attending cardiologist who agreed to repeat Echo at this time. I have sent this recommendation over to Wynonia Hedges, CMA.   CKD stage 3b Most recent kidney function on file is stable. Avoid nephrotoxic agents. No med changes at this time. Continue to follow with PCP.   Dispo: Follow-up with me/APP in 3 months or sooner if anything changes.   Signed, Lasalle Pointer, NP

## 2024-02-29 ENCOUNTER — Encounter: Payer: Self-pay | Admitting: Nurse Practitioner

## 2024-03-01 ENCOUNTER — Ambulatory Visit: Attending: Cardiology | Admitting: *Deleted

## 2024-03-01 DIAGNOSIS — Z952 Presence of prosthetic heart valve: Secondary | ICD-10-CM | POA: Diagnosis not present

## 2024-03-01 DIAGNOSIS — Z5181 Encounter for therapeutic drug level monitoring: Secondary | ICD-10-CM

## 2024-03-01 LAB — POCT INR: INR: 1.3 — AB (ref 2.0–3.0)

## 2024-03-01 NOTE — Patient Instructions (Signed)
 Increase warfarin to 1 tablet daily except 1 1/2 tablet on Tuesdays and Fridays Recheck in 10 days

## 2024-03-03 ENCOUNTER — Telehealth: Payer: Self-pay | Admitting: Nurse Practitioner

## 2024-03-03 NOTE — Telephone Encounter (Signed)
 Left message for patient to call back

## 2024-03-03 NOTE — Telephone Encounter (Signed)
-----   Message from Lasalle Pointer sent at 03/01/2024  4:28 PM EDT ----- Please let patient know that I have discussed her most recent Echo with her cardiologist, Dr. Londa Rival.   He agreed with my recommendations to repeat an echocardiogram to evaluate her valves as well as her known pulmonary hypertension.  I would like to get this echocardiogram done within the next month if possible.  Keep follow-up with me as scheduled.   Thanks!   Best, Lasalle Pointer, NP ----- Message ----- From: Gerard Knight, MD Sent: 03/01/2024   2:21 PM EDT To: Lasalle Pointer, NP  That echocardiogram in March was done at [facility] so I cannot review the images.  Yes, would go ahead and get an echocardiogram done through our system.  She was also reported to have severe eccentric mitral regurgitation and moderate pulmonary hypertension. ----- Message ----- From: Lasalle Pointer, NP Sent: 03/01/2024   1:39 PM EDT To: Gerard Knight, MD  Doing very well after hospital discharge.  Echocardiogram in March 2025 revealed a small, mobile echodensity noted on the ventricular side of the anterior mitral valve leaflet. Pt denies any symptoms.    Wanted to consult you to see when this should be repeated.  Plan to see her back soon for follow-up within the next 3 months.  Thanks!   Best, Lasalle Pointer, NP

## 2024-03-04 ENCOUNTER — Other Ambulatory Visit: Payer: Self-pay | Admitting: Nurse Practitioner

## 2024-03-04 DIAGNOSIS — I272 Pulmonary hypertension, unspecified: Secondary | ICD-10-CM

## 2024-03-04 NOTE — Telephone Encounter (Signed)
 MyChart message sent to patient.

## 2024-03-04 NOTE — Telephone Encounter (Signed)
 Patient son responded via mychart and was agreeable to do the echo. Echo order placed and sent for scheduling

## 2024-03-04 NOTE — Telephone Encounter (Signed)
 Left message for patient to call back

## 2024-03-15 ENCOUNTER — Ambulatory Visit (INDEPENDENT_AMBULATORY_CARE_PROVIDER_SITE_OTHER): Admitting: *Deleted

## 2024-03-15 DIAGNOSIS — Z952 Presence of prosthetic heart valve: Secondary | ICD-10-CM | POA: Diagnosis not present

## 2024-03-15 DIAGNOSIS — Z5181 Encounter for therapeutic drug level monitoring: Secondary | ICD-10-CM

## 2024-03-15 LAB — POCT INR: INR: 1.3 — AB (ref 2.0–3.0)

## 2024-03-15 NOTE — Patient Instructions (Signed)
 Take warfarin 2 tablets tonight then increase dose to 1 1/2 tablets daily except 1 tablet on Sundays and Thursdays Recheck in 1 wk

## 2024-03-22 ENCOUNTER — Ambulatory Visit: Attending: Cardiology | Admitting: *Deleted

## 2024-03-22 DIAGNOSIS — Z5181 Encounter for therapeutic drug level monitoring: Secondary | ICD-10-CM

## 2024-03-22 DIAGNOSIS — Z952 Presence of prosthetic heart valve: Secondary | ICD-10-CM

## 2024-03-22 LAB — POCT INR: INR: 1.3 — AB (ref 2.0–3.0)

## 2024-03-22 NOTE — Patient Instructions (Signed)
 Increase warfarin to 1 1/2 tablets daily except 2 tablets on Tuesdays and Fridays Recheck in 1 wk

## 2024-03-28 ENCOUNTER — Ambulatory Visit: Attending: Cardiology | Admitting: *Deleted

## 2024-03-28 DIAGNOSIS — Z952 Presence of prosthetic heart valve: Secondary | ICD-10-CM | POA: Diagnosis not present

## 2024-03-28 DIAGNOSIS — Z5181 Encounter for therapeutic drug level monitoring: Secondary | ICD-10-CM | POA: Diagnosis not present

## 2024-03-28 LAB — POCT INR: INR: 2.1 (ref 2.0–3.0)

## 2024-03-28 NOTE — Patient Instructions (Signed)
 Continue warfarin 1 1/2 tablets daily except 2 tablets on Tuesdays and Fridays Recheck in 2 wk

## 2024-04-11 ENCOUNTER — Ambulatory Visit: Attending: Cardiology | Admitting: *Deleted

## 2024-04-11 DIAGNOSIS — Z952 Presence of prosthetic heart valve: Secondary | ICD-10-CM

## 2024-04-11 DIAGNOSIS — I272 Pulmonary hypertension, unspecified: Secondary | ICD-10-CM | POA: Diagnosis not present

## 2024-04-11 DIAGNOSIS — Z5181 Encounter for therapeutic drug level monitoring: Secondary | ICD-10-CM | POA: Diagnosis not present

## 2024-04-11 LAB — POCT INR: INR: 3.1 — AB (ref 2.0–3.0)

## 2024-04-11 NOTE — Patient Instructions (Addendum)
 Take warfarin 1 tablet tomorrow then resume 1 1/2 tablets daily except 2 tablets on Tuesdays and Fridays Recheck in 3 wk

## 2024-04-15 ENCOUNTER — Ambulatory Visit: Payer: Self-pay | Admitting: Nurse Practitioner

## 2024-04-18 NOTE — Progress Notes (Signed)
Please see anticoagulation encounter.

## 2024-04-28 ENCOUNTER — Ambulatory Visit: Attending: Nurse Practitioner

## 2024-04-28 DIAGNOSIS — I272 Pulmonary hypertension, unspecified: Secondary | ICD-10-CM | POA: Diagnosis not present

## 2024-04-29 LAB — ECHOCARDIOGRAM COMPLETE
AR max vel: 2.07 cm2
AV Area VTI: 1.92 cm2
AV Area mean vel: 2.12 cm2
AV Mean grad: 5 mmHg
AV Peak grad: 9.6 mmHg
Ao pk vel: 1.55 m/s
Calc EF: 76.9 %
MV VTI: 1.28 cm2
S' Lateral: 3.3 cm
Single Plane A2C EF: 78.4 %
Single Plane A4C EF: 73.2 %

## 2024-05-02 ENCOUNTER — Ambulatory Visit: Attending: Internal Medicine | Admitting: *Deleted

## 2024-05-02 DIAGNOSIS — I4891 Unspecified atrial fibrillation: Secondary | ICD-10-CM

## 2024-05-02 DIAGNOSIS — Z5181 Encounter for therapeutic drug level monitoring: Secondary | ICD-10-CM

## 2024-05-02 LAB — POCT INR: INR: 3.3 — AB (ref 2.0–3.0)

## 2024-05-02 NOTE — Progress Notes (Signed)
Please see anticoagulation encounter.

## 2024-05-02 NOTE — Patient Instructions (Signed)
 Take warfarin 1/2 tablet tomorrow then decrease dose to 1 1/2 tablets daily  Recheck in 3 wk

## 2024-05-05 ENCOUNTER — Encounter (HOSPITAL_COMMUNITY): Payer: Self-pay

## 2024-05-05 ENCOUNTER — Emergency Department (HOSPITAL_COMMUNITY)

## 2024-05-05 ENCOUNTER — Inpatient Hospital Stay (HOSPITAL_COMMUNITY)
Admission: EM | Admit: 2024-05-05 | Discharge: 2024-05-10 | DRG: 291 | Disposition: A | Discharge status: Skilled Nursing Facility | Attending: Internal Medicine | Admitting: Internal Medicine

## 2024-05-05 ENCOUNTER — Other Ambulatory Visit: Payer: Self-pay

## 2024-05-05 DIAGNOSIS — Z79890 Hormone replacement therapy (postmenopausal): Secondary | ICD-10-CM | POA: Diagnosis not present

## 2024-05-05 DIAGNOSIS — Z7901 Long term (current) use of anticoagulants: Secondary | ICD-10-CM

## 2024-05-05 DIAGNOSIS — N179 Acute kidney failure, unspecified: Secondary | ICD-10-CM | POA: Diagnosis present

## 2024-05-05 DIAGNOSIS — Z9109 Other allergy status, other than to drugs and biological substances: Secondary | ICD-10-CM

## 2024-05-05 DIAGNOSIS — D689 Coagulation defect, unspecified: Secondary | ICD-10-CM | POA: Diagnosis present

## 2024-05-05 DIAGNOSIS — G2581 Restless legs syndrome: Secondary | ICD-10-CM | POA: Diagnosis present

## 2024-05-05 DIAGNOSIS — E876 Hypokalemia: Secondary | ICD-10-CM | POA: Diagnosis not present

## 2024-05-05 DIAGNOSIS — I5031 Acute diastolic (congestive) heart failure: Secondary | ICD-10-CM

## 2024-05-05 DIAGNOSIS — N183 Chronic kidney disease, stage 3 unspecified: Secondary | ICD-10-CM | POA: Diagnosis not present

## 2024-05-05 DIAGNOSIS — I5189 Other ill-defined heart diseases: Secondary | ICD-10-CM

## 2024-05-05 DIAGNOSIS — J9621 Acute and chronic respiratory failure with hypoxia: Secondary | ICD-10-CM | POA: Diagnosis present

## 2024-05-05 DIAGNOSIS — N1832 Chronic kidney disease, stage 3b: Secondary | ICD-10-CM | POA: Diagnosis present

## 2024-05-05 DIAGNOSIS — Z952 Presence of prosthetic heart valve: Secondary | ICD-10-CM | POA: Diagnosis not present

## 2024-05-05 DIAGNOSIS — Z604 Social exclusion and rejection: Secondary | ICD-10-CM | POA: Diagnosis present

## 2024-05-05 DIAGNOSIS — Z79899 Other long term (current) drug therapy: Secondary | ICD-10-CM

## 2024-05-05 DIAGNOSIS — E039 Hypothyroidism, unspecified: Secondary | ICD-10-CM | POA: Diagnosis present

## 2024-05-05 DIAGNOSIS — Z9981 Dependence on supplemental oxygen: Secondary | ICD-10-CM

## 2024-05-05 DIAGNOSIS — I2583 Coronary atherosclerosis due to lipid rich plaque: Secondary | ICD-10-CM | POA: Diagnosis present

## 2024-05-05 DIAGNOSIS — Z860101 Personal history of adenomatous and serrated colon polyps: Secondary | ICD-10-CM

## 2024-05-05 DIAGNOSIS — I5033 Acute on chronic diastolic (congestive) heart failure: Secondary | ICD-10-CM | POA: Diagnosis present

## 2024-05-05 DIAGNOSIS — Z90710 Acquired absence of both cervix and uterus: Secondary | ICD-10-CM

## 2024-05-05 DIAGNOSIS — K219 Gastro-esophageal reflux disease without esophagitis: Secondary | ICD-10-CM | POA: Diagnosis present

## 2024-05-05 DIAGNOSIS — E785 Hyperlipidemia, unspecified: Secondary | ICD-10-CM | POA: Diagnosis present

## 2024-05-05 DIAGNOSIS — R32 Unspecified urinary incontinence: Secondary | ICD-10-CM | POA: Diagnosis present

## 2024-05-05 DIAGNOSIS — I4891 Unspecified atrial fibrillation: Secondary | ICD-10-CM | POA: Diagnosis present

## 2024-05-05 DIAGNOSIS — I480 Paroxysmal atrial fibrillation: Secondary | ICD-10-CM | POA: Diagnosis not present

## 2024-05-05 DIAGNOSIS — R931 Abnormal findings on diagnostic imaging of heart and coronary circulation: Secondary | ICD-10-CM | POA: Diagnosis present

## 2024-05-05 DIAGNOSIS — I25708 Atherosclerosis of coronary artery bypass graft(s), unspecified, with other forms of angina pectoris: Secondary | ICD-10-CM | POA: Diagnosis not present

## 2024-05-05 DIAGNOSIS — D631 Anemia in chronic kidney disease: Secondary | ICD-10-CM | POA: Diagnosis present

## 2024-05-05 DIAGNOSIS — I509 Heart failure, unspecified: Principal | ICD-10-CM

## 2024-05-05 DIAGNOSIS — J9601 Acute respiratory failure with hypoxia: Secondary | ICD-10-CM

## 2024-05-05 DIAGNOSIS — J441 Chronic obstructive pulmonary disease with (acute) exacerbation: Secondary | ICD-10-CM | POA: Diagnosis present

## 2024-05-05 DIAGNOSIS — I959 Hypotension, unspecified: Secondary | ICD-10-CM | POA: Diagnosis present

## 2024-05-05 DIAGNOSIS — I482 Chronic atrial fibrillation, unspecified: Secondary | ICD-10-CM | POA: Diagnosis not present

## 2024-05-05 DIAGNOSIS — Z880 Allergy status to penicillin: Secondary | ICD-10-CM

## 2024-05-05 DIAGNOSIS — Z1152 Encounter for screening for COVID-19: Secondary | ICD-10-CM

## 2024-05-05 DIAGNOSIS — J9620 Acute and chronic respiratory failure, unspecified whether with hypoxia or hypercapnia: Secondary | ICD-10-CM | POA: Diagnosis present

## 2024-05-05 DIAGNOSIS — Z66 Do not resuscitate: Secondary | ICD-10-CM | POA: Diagnosis present

## 2024-05-05 DIAGNOSIS — J9622 Acute and chronic respiratory failure with hypercapnia: Secondary | ICD-10-CM | POA: Diagnosis present

## 2024-05-05 DIAGNOSIS — I2489 Other forms of acute ischemic heart disease: Secondary | ICD-10-CM | POA: Diagnosis present

## 2024-05-05 DIAGNOSIS — Z7189 Other specified counseling: Secondary | ICD-10-CM | POA: Diagnosis not present

## 2024-05-05 DIAGNOSIS — I071 Rheumatic tricuspid insufficiency: Secondary | ICD-10-CM | POA: Diagnosis present

## 2024-05-05 DIAGNOSIS — I4821 Permanent atrial fibrillation: Secondary | ICD-10-CM | POA: Diagnosis present

## 2024-05-05 DIAGNOSIS — Z87891 Personal history of nicotine dependence: Secondary | ICD-10-CM

## 2024-05-05 DIAGNOSIS — Z823 Family history of stroke: Secondary | ICD-10-CM

## 2024-05-05 DIAGNOSIS — I2510 Atherosclerotic heart disease of native coronary artery without angina pectoris: Secondary | ICD-10-CM | POA: Diagnosis present

## 2024-05-05 DIAGNOSIS — J9602 Acute respiratory failure with hypercapnia: Secondary | ICD-10-CM | POA: Diagnosis not present

## 2024-05-05 DIAGNOSIS — Z5982 Transportation insecurity: Secondary | ICD-10-CM

## 2024-05-05 DIAGNOSIS — R5381 Other malaise: Secondary | ICD-10-CM | POA: Diagnosis present

## 2024-05-05 DIAGNOSIS — J9690 Respiratory failure, unspecified, unspecified whether with hypoxia or hypercapnia: Secondary | ICD-10-CM | POA: Diagnosis not present

## 2024-05-05 DIAGNOSIS — I130 Hypertensive heart and chronic kidney disease with heart failure and stage 1 through stage 4 chronic kidney disease, or unspecified chronic kidney disease: Secondary | ICD-10-CM | POA: Diagnosis present

## 2024-05-05 LAB — LACTIC ACID, PLASMA
Lactic Acid, Venous: 1.1 mmol/L (ref 0.5–1.9)
Lactic Acid, Venous: 1.6 mmol/L (ref 0.5–1.9)

## 2024-05-05 LAB — CBC WITH DIFFERENTIAL/PLATELET
Abs Immature Granulocytes: 0.08 K/uL — ABNORMAL HIGH (ref 0.00–0.07)
Basophils Absolute: 0 K/uL (ref 0.0–0.1)
Basophils Relative: 0 %
Eosinophils Absolute: 0.1 K/uL (ref 0.0–0.5)
Eosinophils Relative: 1 %
HCT: 32.5 % — ABNORMAL LOW (ref 36.0–46.0)
Hemoglobin: 9.8 g/dL — ABNORMAL LOW (ref 12.0–15.0)
Immature Granulocytes: 1 %
Lymphocytes Relative: 11 %
Lymphs Abs: 0.8 K/uL (ref 0.7–4.0)
MCH: 27.7 pg (ref 26.0–34.0)
MCHC: 30.2 g/dL (ref 30.0–36.0)
MCV: 91.8 fL (ref 80.0–100.0)
Monocytes Absolute: 0.7 K/uL (ref 0.1–1.0)
Monocytes Relative: 11 %
Neutro Abs: 5.1 K/uL (ref 1.7–7.7)
Neutrophils Relative %: 76 %
Platelets: 199 K/uL (ref 150–400)
RBC: 3.54 MIL/uL — ABNORMAL LOW (ref 3.87–5.11)
RDW: 15.9 % — ABNORMAL HIGH (ref 11.5–15.5)
WBC: 6.8 K/uL (ref 4.0–10.5)
nRBC: 0 % (ref 0.0–0.2)

## 2024-05-05 LAB — BLOOD GAS, VENOUS
Acid-Base Excess: 4 mmol/L — ABNORMAL HIGH (ref 0.0–2.0)
Bicarbonate: 33.1 mmol/L — ABNORMAL HIGH (ref 20.0–28.0)
Drawn by: 23968
O2 Saturation: 35.2 %
Patient temperature: 36.7
pCO2, Ven: 78 mmHg (ref 44–60)
pH, Ven: 7.23 — ABNORMAL LOW (ref 7.25–7.43)
pO2, Ven: <31 mmHg — CL (ref 32–45)

## 2024-05-05 LAB — COMPREHENSIVE METABOLIC PANEL WITH GFR
ALT: 14 U/L (ref 0–44)
AST: 26 U/L (ref 15–41)
Albumin: 3.7 g/dL (ref 3.5–5.0)
Alkaline Phosphatase: 77 U/L (ref 38–126)
Anion gap: 13 (ref 5–15)
BUN: 23 mg/dL (ref 8–23)
CO2: 28 mmol/L (ref 22–32)
Calcium: 8.6 mg/dL — ABNORMAL LOW (ref 8.9–10.3)
Chloride: 100 mmol/L (ref 98–111)
Creatinine, Ser: 2.02 mg/dL — ABNORMAL HIGH (ref 0.44–1.00)
GFR, Estimated: 26 mL/min — ABNORMAL LOW (ref >60)
Glucose, Bld: 102 mg/dL — ABNORMAL HIGH (ref 70–99)
Potassium: 3.5 mmol/L (ref 3.5–5.1)
Sodium: 141 mmol/L (ref 135–145)
Total Bilirubin: 0.8 mg/dL (ref 0.0–1.2)
Total Protein: 7.4 g/dL (ref 6.5–8.1)

## 2024-05-05 LAB — RESP PANEL BY RT-PCR (RSV, FLU A&B, COVID)  RVPGX2
Influenza A by PCR: NEGATIVE
Influenza B by PCR: NEGATIVE
Resp Syncytial Virus by PCR: NEGATIVE
SARS Coronavirus 2 by RT PCR: NEGATIVE

## 2024-05-05 LAB — AMMONIA: Ammonia: 15 umol/L (ref 9–35)

## 2024-05-05 LAB — TROPONIN I (HIGH SENSITIVITY)
Troponin I (High Sensitivity): 19 ng/L — ABNORMAL HIGH (ref <18)
Troponin I (High Sensitivity): 18 ng/L — ABNORMAL HIGH (ref <18)

## 2024-05-05 LAB — BRAIN NATRIURETIC PEPTIDE: B Natriuretic Peptide: 1203 pg/mL — ABNORMAL HIGH (ref 0.0–100.0)

## 2024-05-05 MED ORDER — FUROSEMIDE 10 MG/ML IJ SOLN
40.0000 mg | Freq: Two times a day (BID) | INTRAMUSCULAR | Status: DC
  Administered 2024-05-05: 40 mg via INTRAVENOUS
  Filled 2024-05-05: qty 4

## 2024-05-05 MED ORDER — IPRATROPIUM-ALBUTEROL 0.5-2.5 (3) MG/3ML IN SOLN
3.0000 mL | Freq: Once | RESPIRATORY_TRACT | Status: AC
  Administered 2024-05-05: 3 mL via RESPIRATORY_TRACT
  Filled 2024-05-05: qty 3

## 2024-05-05 MED ORDER — LEVOTHYROXINE SODIUM 25 MCG PO TABS
50.0000 ug | ORAL_TABLET | Freq: Every day | ORAL | Status: DC
  Administered 2024-05-06 – 2024-05-10 (×4): 50 ug via ORAL
  Filled 2024-05-05 (×4): qty 2

## 2024-05-05 MED ORDER — HYDRALAZINE HCL 20 MG/ML IJ SOLN: 5.0000 mg | INTRAMUSCULAR | Status: DC | PRN

## 2024-05-05 MED ORDER — NOREPINEPHRINE 4 MG/250ML-% IV SOLN: 0.0000 ug/min | INTRAVENOUS | Status: DC

## 2024-05-05 MED ORDER — METHYLPREDNISOLONE SODIUM SUCC 125 MG IJ SOLR
125.0000 mg | Freq: Once | INTRAMUSCULAR | Status: AC
  Administered 2024-05-05: 125 mg via INTRAVENOUS
  Filled 2024-05-05: qty 2

## 2024-05-05 MED ORDER — BISACODYL 5 MG PO TBEC
5.0000 mg | DELAYED_RELEASE_TABLET | Freq: Every day | ORAL | Status: DC | PRN
  Filled 2024-05-05 (×2): qty 1

## 2024-05-05 MED ORDER — SODIUM CHLORIDE 0.9 % IV SOLN: 250.0000 mL | INTRAVENOUS | Status: AC

## 2024-05-05 MED ORDER — ACETAMINOPHEN 325 MG PO TABS
650.0000 mg | ORAL_TABLET | Freq: Four times a day (QID) | ORAL | Status: DC | PRN
  Administered 2024-05-07 – 2024-05-09 (×4): 650 mg via ORAL
  Filled 2024-05-05 (×4): qty 2

## 2024-05-05 MED ORDER — ONDANSETRON HCL 4 MG PO TABS: 4.0000 mg | ORAL_TABLET | Freq: Four times a day (QID) | ORAL | Status: DC | PRN

## 2024-05-05 MED ORDER — ACETAMINOPHEN 650 MG RE SUPP: 650.0000 mg | Freq: Four times a day (QID) | RECTAL | Status: DC | PRN

## 2024-05-05 MED ORDER — PANTOPRAZOLE SODIUM 40 MG IV SOLR
40.0000 mg | INTRAVENOUS | Status: DC
  Administered 2024-05-05 – 2024-05-09 (×5): 40 mg via INTRAVENOUS
  Filled 2024-05-05 (×5): qty 10

## 2024-05-05 MED ORDER — CHLORHEXIDINE GLUCONATE CLOTH 2 % EX PADS
6.0000 | MEDICATED_PAD | Freq: Every day | CUTANEOUS | Status: DC
  Administered 2024-05-05 – 2024-05-08 (×4): 6 via TOPICAL

## 2024-05-05 MED ORDER — METHYLPREDNISOLONE SODIUM SUCC 125 MG IJ SOLR
125.0000 mg | Freq: Every day | INTRAMUSCULAR | Status: DC
  Administered 2024-05-06 – 2024-05-09 (×4): 125 mg via INTRAVENOUS
  Filled 2024-05-05 (×4): qty 2

## 2024-05-05 MED ORDER — DILTIAZEM HCL ER COATED BEADS 120 MG PO CP24
120.0000 mg | ORAL_CAPSULE | Freq: Every day | ORAL | Status: DC
  Administered 2024-05-06 – 2024-05-10 (×5): 120 mg via ORAL
  Filled 2024-05-05 (×5): qty 1

## 2024-05-05 MED ORDER — METOLAZONE 2.5 MG PO TABS
5.0000 mg | ORAL_TABLET | Freq: Once | ORAL | Status: AC
  Administered 2024-05-05: 5 mg via ORAL
  Filled 2024-05-05: qty 2

## 2024-05-05 MED ORDER — ROSUVASTATIN CALCIUM 20 MG PO TABS
20.0000 mg | ORAL_TABLET | Freq: Every day | ORAL | Status: DC
  Administered 2024-05-05 – 2024-05-09 (×5): 20 mg via ORAL
  Filled 2024-05-05 (×5): qty 1

## 2024-05-05 MED ORDER — ALPRAZOLAM 0.5 MG PO TABS
0.5000 mg | ORAL_TABLET | Freq: Two times a day (BID) | ORAL | Status: DC
  Administered 2024-05-05 – 2024-05-10 (×10): 0.5 mg via ORAL
  Filled 2024-05-05 (×10): qty 1

## 2024-05-05 MED ORDER — FUROSEMIDE 10 MG/ML IJ SOLN
40.0000 mg | Freq: Once | INTRAMUSCULAR | Status: AC
  Administered 2024-05-05: 40 mg via INTRAVENOUS
  Filled 2024-05-05: qty 4

## 2024-05-05 MED ORDER — ADULT MULTIVITAMIN W/MINERALS CH
1.0000 | ORAL_TABLET | Freq: Every day | ORAL | Status: DC
  Administered 2024-05-06 – 2024-05-10 (×5): 1 via ORAL
  Filled 2024-05-05 (×5): qty 1

## 2024-05-05 MED ORDER — ALBUTEROL SULFATE (2.5 MG/3ML) 0.083% IN NEBU
2.5000 mg | INHALATION_SOLUTION | RESPIRATORY_TRACT | Status: DC | PRN
  Administered 2024-05-06: 2.5 mg via RESPIRATORY_TRACT
  Filled 2024-05-05: qty 3

## 2024-05-05 MED ORDER — ROPINIROLE HCL 1 MG PO TABS
3.0000 mg | ORAL_TABLET | Freq: Two times a day (BID) | ORAL | Status: DC
  Administered 2024-05-05 – 2024-05-10 (×10): 3 mg via ORAL
  Filled 2024-05-05 (×10): qty 3

## 2024-05-05 MED ORDER — FUROSEMIDE 10 MG/ML IJ SOLN
80.0000 mg | Freq: Two times a day (BID) | INTRAMUSCULAR | Status: DC
  Administered 2024-05-06: 80 mg via INTRAVENOUS
  Filled 2024-05-05: qty 8

## 2024-05-05 MED ORDER — ONDANSETRON HCL 4 MG/2ML IJ SOLN: 4.0000 mg | Freq: Four times a day (QID) | INTRAMUSCULAR | Status: DC | PRN

## 2024-05-05 MED ORDER — ALLOPURINOL 100 MG PO TABS
100.0000 mg | ORAL_TABLET | Freq: Every day | ORAL | Status: DC
  Administered 2024-05-06 – 2024-05-10 (×5): 100 mg via ORAL
  Filled 2024-05-05 (×5): qty 1

## 2024-05-05 MED ORDER — METOPROLOL SUCCINATE ER 50 MG PO TB24: 50.0000 mg | ORAL_TABLET | Freq: Every day | ORAL | Status: DC

## 2024-05-05 MED ORDER — MAGNESIUM HYDROXIDE 400 MG/5ML PO SUSP: 30.0000 mL | Freq: Every day | ORAL | Status: DC | PRN

## 2024-05-05 MED ORDER — PANTOPRAZOLE SODIUM 40 MG PO TBEC: 40.0000 mg | DELAYED_RELEASE_TABLET | Freq: Every day | ORAL | Status: DC

## 2024-05-05 MED ORDER — METOPROLOL SUCCINATE ER 25 MG PO TB24: 25.0000 mg | ORAL_TABLET | Freq: Every day | ORAL | Status: DC

## 2024-05-05 MED ORDER — DAPAGLIFLOZIN PROPANEDIOL 10 MG PO TABS
10.0000 mg | ORAL_TABLET | Freq: Every day | ORAL | Status: DC
  Filled 2024-05-05: qty 1

## 2024-05-05 MED ORDER — IPRATROPIUM-ALBUTEROL 0.5-2.5 (3) MG/3ML IN SOLN
3.0000 mL | RESPIRATORY_TRACT | Status: DC
  Administered 2024-05-05 – 2024-05-07 (×11): 3 mL via RESPIRATORY_TRACT
  Filled 2024-05-05 (×12): qty 3

## 2024-05-05 MED ORDER — SODIUM CHLORIDE 0.9 % IV SOLN
1.0000 g | INTRAVENOUS | Status: DC
  Administered 2024-05-05 – 2024-05-08 (×4): 1 g via INTRAVENOUS
  Filled 2024-05-05 (×4): qty 10

## 2024-05-05 NOTE — ED Notes (Addendum)
 SABRA

## 2024-05-05 NOTE — Progress Notes (Signed)
 Patient transported from ED room 6 to ICU bed 4 on BIPAP. No adverse events noted, VSS throughout.

## 2024-05-05 NOTE — H&P (Signed)
 [facility] H&P   Patient Demographics:    [name], is a 73 y.o. female  MRN: [id number]   DOB - 1951-07-13  Admit Date - 05/05/2024  Outpatient Primary MD for the patient is Renato Dorothey HERO, NP  Referring MD/NP/PA: Dr. Elnor  Patient coming from: Home  Chief Complaint  Patient presents with   Hypotension      HPI:    [name]  is a 72 y.o. female,  with longstanding history of COPD and chronic respiratory failure typically on 4 L/min supplemental oxygen , chronic atrial fibrillation, HTN, HLD, CAD status post 2V CABG with mechanical aortic valve replacement, patient on home oxygen , not on BiPAP or CPAP at home, patient most recent hospitalization was at [facility] for respiratory failure secondary to CHF and COPD. - She presents today secondary to complaints of dyspnea, continuous and deconditioning, son reports she was unable to get out of bed secondary to difficulty breathing, weakness, has been compliant with her home medication including torsemide , no cough, no fever, no chest pain, no vomiting. - In ED she was with significant increased work of breathing, respiratory distress, ABG was obtained, showing significant respiratory acidosis with PCO2 of 78, labs significant for worsening creatinine at 2, BNP significantly elevated at 1203, chest x-ray with evidence of volume overload, lactic acid within normal limit, normal white blood cell count, INR elevated at 4.4, patient was started on BiPAP with improvement of her symptoms, [facility] hospitalist consulted to admit   Review of systems:     A full 10 point Review of Systems was done, except as stated above, all other Review of Systems were negative.   With Past History of the following :    Past Medical History:  Diagnosis Date   Anxiety disorder    Aortic stenosis    Status post St. Jude mechanical AVR 2007   Asthma     Atrial fibrillation (HCC)    Carcinoid tumor of colon 2007   Chronic diastolic heart failure (HCC)    Coronary atherosclerosis of native coronary artery    Status post CABG 2007   GERD (gastroesophageal reflux disease)    History of colonoscopy 2003   Dr. 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 Shaaron, benign biopsies   RLS (restless legs syndrome)    Schatzki's ring       Past Surgical History:  Procedure Laterality Date   ABDOMINAL HYSTERECTOMY     AORTIC VALVE REPLACEMENT  2007   #25 mm St. Jude mechanical prosthesis with Hemashield tube graft repair of ascending aneurysm   APPENDECTOMY  2007   BACK SURGERY     lumbar 4 and 5    BIOPSY  02/05/2012   RMR:Two tongues of salmon-colored epithelium distal esophagus, very short-segment Barrett's s/p bx/Small  hiatal hernia, otherwise normal stomach, D1, D2. Status post esophageal dilation. Biopsy showed GERD.   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 Shaaron- schatzki's ring, shoft peptic stricture at GE junction.   ESOPHAGOGASTRODUODENOSCOPY (EGD) WITH PROPOFOL  N/A 02/08/2018   Procedure: ESOPHAGOGASTRODUODENOSCOPY (EGD) WITH PROPOFOL ;  Surgeon: Shaaron Lamar HERO, MD;  Location: [facility];  Service: Endoscopy;  Laterality: N/A;  8:15am   FOOT SURGERY     bilateral bunionectomy   HERNIA REPAIR     with mesh   LAPAROTOMY  2007   small bowel resection secondary to small bowel obstruction   MALONEY DILATION  02/05/2012   Procedure: AGAPITO DILATION;  Surgeon: Lamar HERO Shaaron, MD;  Location: [facility];  Service: Endoscopy;   Laterality: N/A;  56mm    MALONEY DILATION N/A 02/08/2018   Procedure: AGAPITO DILATION;  Surgeon: Shaaron Lamar HERO, MD;  Location: [facility];  Service: Endoscopy;  Laterality: N/A;   ORIF TIBIA PLATEAU Right 06/18/2023   Procedure: OPEN REDUCTION INTERNAL FIXATION (ORIF) TIBIAL PLATEAU;  Surgeon: Onesimo Oneil LABOR, MD;  Location: [facility];  Service: Orthopedics;  Laterality: Right;   Teeth removal        Social History:     Social History   Tobacco Use   Smoking status: Former    Current packs/day: 0.00    Average packs/day: 1 pack/day for 20.0 years (20.0 ttl pk-yrs)    Types: Cigarettes    Start date: 10/20/1968    Quit date: 10/20/1988    Years since quitting: 35.5   Smokeless tobacco: Never  Substance Use Topics   Alcohol use: No    Alcohol/week: 0.0 standard drinks of alcohol       Family History :     Family History  Problem Relation Age of Onset   Stroke Mother    Cancer Father    Anesthesia problems Neg Hx    Hypotension Neg Hx    Malignant hyperthermia Neg Hx    Pseudochol deficiency Neg Hx      Home Medications:   Prior to Admission medications   Medication Sig Start Date End Date Taking? Authorizing Provider  albuterol  (VENTOLIN  HFA) 108 (90 Base) MCG/ACT inhaler Inhale 1-2 puffs into the lungs daily as needed for wheezing or shortness of breath. 01/19/24  Yes [provider]  allopurinol  (ZYLOPRIM ) 100 MG tablet Take 100 mg by mouth daily.   Yes [provider]  ALPRAZolam  (XANAX ) 0.5 MG tablet Take 1 tablet (0.5 mg total) by mouth 2 (two) times daily. 09/23/23  Yes Shahmehdi, Seyed A, MD  CAPLYTA  42 MG capsule Take 42 mg by mouth at bedtime. 12/09/23  Yes [provider]  diltiazem  (CARDIZEM  CD) 120 MG 24 hr capsule Take 1 capsule (120 mg total) by mouth daily. 04/01/18  Yes Ricky Fines, MD  FARXIGA  10 MG TABS tablet Take 10 mg by mouth daily. 01/21/21  Yes [provider]  ipratropium (ATROVENT ) 0.03 % nasal spray Place 2  sprays into both nostrils 2 (two) times daily as needed for rhinitis.  05/26/24 Yes [provider]  levothyroxine  (SYNTHROID ,  LEVOTHROID) 50 MCG tablet Take 50 mcg by mouth daily before breakfast.    Yes [provider]  metoprolol  succinate (TOPROL -XL) 50 MG 24 hr tablet Take 50 mg by mouth daily.   Yes [provider]  Multiple Vitamin (MULTIVITAMIN WITH MINERALS) TABS tablet Take 1 tablet by mouth daily.   Yes [provider]  omeprazole  (PRILOSEC) 20 MG capsule Take 20 mg by mouth daily.   Yes [provider]  OXYGEN  Inhale 4 L into the lungs daily.   Yes [provider]  rOPINIRole  (REQUIP ) 3 MG tablet Take 1 tablet (3 mg total) by mouth 2 (two) times daily. 09/23/23  Yes Shahmehdi, Adriana LABOR, MD  rosuvastatin  (CRESTOR ) 20 MG tablet TAKE ONE (1) TABLET BY MOUTH EVERY DAY 02/06/23  Yes Debera Jayson MATSU, MD  torsemide  (DEMADEX ) 100 MG tablet Take 100 mg by mouth daily.   Yes [provider]  warfarin (COUMADIN ) 2 MG tablet Take 1.5-2 tablets (3-4 mg total) by mouth See admin instructions. 4mg  on Tuesday and 3 mg daily the rest of the week. Patient taking differently: Take 3-4 mg by mouth See admin instructions. Take 3 mg by mouth every day 09/06/18  Yes Ricky Fines, MD     Allergies:     Allergies  Allergen Reactions   Penicillin G Itching   Penicillins Hives, Itching, Swelling and Rash   Dust Mite Extract Other (See Comments)    Seasonal allergies     Physical Exam:   Vitals  Blood pressure 104/72, pulse (!) 59, temperature 98 F (36.7 C), temperature source Oral, resp. rate 20, height 5' 8 (1.727 m), weight 64.4 kg, SpO2 98%.   1. General Frail elderly female, laying in bed in respiratory distress, wearing BiPAP  2. Normal affect and insight, Not Suicidal or Homicidal, Awake Alert, Oriented X 3.  3. No F.N deficits, ALL C.Nerves Intact, Strength 5/5 all 4 extremities, Sensation intact all 4 extremities, Plantars  down going.  4. Ears and Eyes appear Normal, Conjunctivae clear, wearing BiPAP  5. Supple Neck, No cervical lymphadenopathy appriciated, No Carotid Bruits.  6. Symmetrical Chest wall movement, tachypnic, crackles at the bases, diminished air entry good .  7.  Mechanical click. No rubs, +1 edema  8. Positive Bowel Sounds, Abdomen Soft, No tenderness, No organomegaly appriciated,No rebound -guarding or rigidity.  9.  No Cyanosis, Normal Skin Turgor, No Skin Rash or Bruise.  10. Good muscle tone,  joints appear normal , no effusions, Normal ROM.     Data Review:    CBC Recent Labs  Lab 05/05/24 1700  WBC 6.8  HGB 9.8*  HCT 32.5*  PLT 199  MCV 91.8  MCH 27.7  MCHC 30.2  RDW 15.9*  LYMPHSABS 0.8  MONOABS 0.7  EOSABS 0.1  BASOSABS 0.0   ------------------------------------------------------------------------------------------------------------------  Chemistries  Recent Labs  Lab 05/05/24 1700  NA 141  K 3.5  CL 100  CO2 28  GLUCOSE 102*  BUN 23  CREATININE 2.02*  CALCIUM  8.6*  AST 26  ALT 14  ALKPHOS 77  BILITOT 0.8   ------------------------------------------------------------------------------------------------------------------ estimated creatinine clearance is 25.4 mL/min (A) (by C-G formula based on SCr of 2.02 mg/dL (H)). ------------------------------------------------------------------------------------------------------------------ No results for input(s): TSH, T4TOTAL, T3FREE, THYROIDAB in the last 72 hours.  Invalid input(s): FREET3  Coagulation profile Recent Labs  Lab 05/02/24 1554  INR 3.3*   ------------------------------------------------------------------------------------------------------------------- No results for input(s): DDIMER in the last 72 hours. -------------------------------------------------------------------------------------------------------------------  Cardiac Enzymes No results for input(s): CKMB,  TROPONINI, MYOGLOBIN in the last 168 hours.  Invalid input(s): CK ------------------------------------------------------------------------------------------------------------------    Component Value Date/Time   BNP 1,203.0 (H) 05/05/2024 1700     ---------------------------------------------------------------------------------------------------------------  Urinalysis    Component Value Date/Time   COLORURINE YELLOW 09/14/2023 1229   APPEARANCEUR HAZY (A) 09/14/2023 1229   LABSPEC 1.013 09/14/2023 1229   PHURINE 5.0 09/14/2023 1229   GLUCOSEU NEGATIVE 09/14/2023 1229   HGBUR NEGATIVE 09/14/2023 1229   BILIRUBINUR NEGATIVE 09/14/2023 1229   KETONESUR NEGATIVE 09/14/2023 1229   PROTEINUR 30 (A) 09/14/2023 1229   NITRITE NEGATIVE 09/14/2023 1229   LEUKOCYTESUR NEGATIVE 09/14/2023 1229    ----------------------------------------------------------------------------------------------------------------   Imaging Results:    DG Chest Port 1 View Result Date: 05/05/2024 CLINICAL DATA:  Difficulty breathing EXAM: PORTABLE CHEST 1 VIEW COMPARISON:  Chest x-ray 09/21/2023 FINDINGS: The cardiac silhouette is enlarged, unchanged. Diffuse interstitial prominence persists. Small to moderate right pleural effusion has increased. No pneumothorax or acute fracture identified. Sternotomy wires and prosthetic heart valve again noted. IMPRESSION: 1. Cardiomegaly with diffuse interstitial prominence, likely pulmonary edema. 2. Increasing small to moderate right pleural effusion. Electronically Signed   By: Greig Pique M.D.   On: 05/05/2024 17:34     EKG:  Vent. rate 65 BPM PR interval 187 ms QRS duration 120 ms QT/QTcB 548/570 ms P-R-T axes 0 83 135 Unknown rhythm, irregular rate Nonspecific intraventricular conduction delay Borderline repolarization abnormality  Assessment & Plan:    Principal Problem:   Acute on chronic respiratory failure (HCC) Active Problems:    Hypothyroidism   Hyperlipidemia   Atrial fibrillation, chronic (HCC)   H/O heart valve replacement with mechanical valve   COPD with acute exacerbation (HCC)   Atrial fibrillation with RVR (HCC)   Coronary artery disease of bypass graft of native heart with stable angina pectoris (HCC)   Physical deconditioning   CKD (chronic kidney disease) stage 3, GFR 30-59 ml/min (HCC)   Acute on chronic respiratory failure with hypoxia and hypercapnia. Acute on chronic diastolic CHF Acute COPD exacerbation -Respiratory failure multifactorial, but appears to be mainly due to acute on chronic diastolic CHF -Continue with BiPAP support - Continue with aggressive IV diuresis, will give extra dose of metolazone  and 40 mg of IV Lasix  today, then continue on 60 mg IV twice daily (was 100 mg torsemide  at home) -Continue daily weight, strict ins and outs -Will start on IV Solu-Medrol  -Continue with DuoNebs and plan albuterol  -Will start on Rocephin  - Pressure is soft, which is limiting diuresis, will start on peripheral pressors  Acute on chronic kidney injury:  - Baseline creatinine around 1.5, elevated at 2 on presentation . - Very likely cardiorenal, continue to monitor closely on IV diuresis   Atrial fibrillation, mechanical aortic valve replacement, coagulopathy due to chronic anticoagulation:  Supratherapeutic INR -The patient has had a mechanical aortic valve replacement and has chronic atrial fibrillation rate controlled and chronically anticoagulated with warfarin.   - NRS 4.4, pharmacy consulted to dose warfarin  -Continue with Cardizem  for heart rate control  Hypothyroidism-continue with Synthroid     CAD, HTN, HLD:  -Patient is status post 2V CABG. - Continue with home medication   DVT Prophylaxis on warfarin  AM Labs Ordered, also please review Full Orders  Family Communication: Admission, patients condition and plan of care including tests being ordered have been discussed with  the patient and son at bedside who indicate understanding and agree with the plan and Code Status.  Code Status DNR, patient confirmed her wishes  not to be on a ventilator support, to be DNR, she is agreeable to BiPAP and pressors and central line if needed  Likely DC to home  Consults called: None, will request palliative medicine  Admission status: Inpatient  Time spent in minutes : 75 minutes   Brayton Lye M.D on 05/05/2024 at 8:30 PM   [facility] - Office  [phone]

## 2024-05-05 NOTE — ED Provider Notes (Signed)
 [facility] Provider Note  CSN: [id number] Arrival date & time: 05/05/24 1603  Chief Complaint(s) Hypotension  HPI [name] is a 73 y.o. female with past medical history as below, significant for atrial fibrillation, diastolic heart failure, HLD, who presents to the ED with complaint of dib  Patient is here with son, reports since last night she has been having significant difficulty breathing.  Unable get out of bed secondary to severe dyspnea and weakness.  Unable to ambulate secondary to severe dyspnea.  She has been compliant with her home medications, son feels that she seems somewhat more swollen than normal.  No cough.  No chest pain.  No vomiting.  Patient was incontinent of urine this morning but she reports this because she was too weak to get out of bed.  No fevers  Past Medical History Past Medical History:  Diagnosis Date   Anxiety disorder    Aortic stenosis    Status post St. Jude mechanical AVR 2007   Asthma    Atrial fibrillation (HCC)    Carcinoid tumor of colon 2007   Chronic diastolic heart failure (HCC)    Coronary atherosclerosis of native coronary artery    Status post CABG 2007   GERD (gastroesophageal reflux disease)    History of colonoscopy 2003   Dr. 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 Shaaron, benign biopsies   RLS (restless legs syndrome)    Schatzki's ring    Patient Active Problem List   Diagnosis Date Noted   Acute on chronic respiratory failure with hypoxia (HCC) 09/09/2023   Rib fractures 06/11/2023   Right tibial fracture 06/11/2023   Acute diastolic CHF (congestive heart failure) (HCC) 05/29/2019   Itching 05/29/2019   Acute hypokalemia 05/05/2019   Acute CHF (congestive heart failure) (HCC) 04/08/2019   Acute CHF (HCC) 04/07/2019   Asthma, chronic, unspecified asthma severity, with acute  exacerbation 04/07/2019   Benign essential HTN    CHF (congestive heart failure) (HCC) 07/26/2018   Transaminitis 07/23/2018   Acute on chronic respiratory failure with hypoxia and hypercapnia (HCC) 06/19/2018   Acute respiratory failure with hypoxia (HCC)    Supratherapeutic INR 05/03/2018   CHF exacerbation (HCC) 05/02/2018   Acute metabolic encephalopathy 05/02/2018   CKD (chronic kidney disease) stage 3, GFR 30-59 ml/min (HCC) 05/02/2018   Hypothyroidism 05/02/2018   Generalized weakness 05/02/2018   Physical deconditioning    Coronary artery disease of bypass graft of native heart with stable angina pectoris (HCC)    Renal failure (ARF), acute on chronic (HCC)    Acute on chronic respiratory failure with hypercapnia (HCC) 03/24/2018   Hypomagnesemia 03/24/2018   Hypokalemia 03/24/2018   Hypophosphatemia 03/24/2018   Atrial fibrillation with RVR (HCC) 03/24/2018   Elevated troponin 03/24/2018   Coughing 01/14/2018   Anemia 01/14/2018   Hx of adenomatous colonic polyps 01/14/2018   Acute on chronic diastolic congestive heart failure (HCC) 12/17/2016   COPD with acute exacerbation (HCC) 12/17/2016   Bilateral leg edema 07/05/2014   Encounter for therapeutic drug monitoring 11/15/2013   Chronic venous insufficiency 08/26/2011   Chronic anticoagulation 01/11/2011   Peptic stricture of esophagus 10/04/2010   GERD 09/16/2010   DYSPHAGIA 09/16/2010   H/O heart valve replacement with mechanical valve 06/05/2009   Hyperlipidemia 06/04/2009   Coronary atherosclerosis of native coronary artery 06/04/2009  Atrial fibrillation, chronic (HCC) 06/04/2009   Home Medication(s) Prior to Admission medications   Medication Sig Start Date End Date Taking? Authorizing Provider  albuterol  (VENTOLIN  HFA) 108 (90 Base) MCG/ACT inhaler Inhale 1-2 puffs into the lungs daily as needed for wheezing or shortness of breath. 01/19/24  Yes [provider]  allopurinol  (ZYLOPRIM ) 100 MG tablet  Take 100 mg by mouth daily.   Yes [provider]  ALPRAZolam  (XANAX ) 0.5 MG tablet Take 1 tablet (0.5 mg total) by mouth 2 (two) times daily. 09/23/23  Yes Shahmehdi, Seyed A, MD  CAPLYTA  42 MG capsule Take 42 mg by mouth at bedtime. 12/09/23  Yes [provider]  diltiazem  (CARDIZEM  CD) 120 MG 24 hr capsule Take 1 capsule (120 mg total) by mouth daily. 04/01/18  Yes Ricky Fines, MD  FARXIGA  10 MG TABS tablet Take 10 mg by mouth daily. 01/21/21  Yes [provider]  ipratropium (ATROVENT ) 0.03 % nasal spray Place 2 sprays into both nostrils 2 (two) times daily as needed for rhinitis.  05/26/24 Yes [provider]  levothyroxine  (SYNTHROID , LEVOTHROID) 50 MCG tablet Take 50 mcg by mouth daily before breakfast.    Yes [provider]  metoprolol  succinate (TOPROL -XL) 50 MG 24 hr tablet Take 50 mg by mouth daily.   Yes [provider]  Multiple Vitamin (MULTIVITAMIN WITH MINERALS) TABS tablet Take 1 tablet by mouth daily.   Yes [provider]  omeprazole  (PRILOSEC) 20 MG capsule Take 20 mg by mouth daily.   Yes [provider]  OXYGEN  Inhale 4 L into the lungs daily.   Yes [provider]  rOPINIRole  (REQUIP ) 3 MG tablet Take 1 tablet (3 mg total) by mouth 2 (two) times daily. 09/23/23  Yes Shahmehdi, Adriana LABOR, MD  rosuvastatin  (CRESTOR ) 20 MG tablet TAKE ONE (1) TABLET BY MOUTH EVERY DAY 02/06/23  Yes Debera Jayson MATSU, MD  torsemide  (DEMADEX ) 100 MG tablet Take 100 mg by mouth daily.   Yes [provider]  warfarin (COUMADIN ) 2 MG tablet Take 1.5-2 tablets (3-4 mg total) by mouth See admin instructions. 4mg  on Tuesday and 3 mg daily the rest of the week. Patient taking differently: Take 3-4 mg by mouth See admin instructions. Take 3 mg by mouth every day 09/06/18  Yes Ricky Fines, MD                                                                                                                                     Past Surgical History Past Surgical History:  Procedure Laterality Date   ABDOMINAL HYSTERECTOMY     AORTIC VALVE REPLACEMENT  2007   #25 mm St. Jude mechanical prosthesis with Hemashield tube graft repair of ascending aneurysm   APPENDECTOMY  2007   BACK SURGERY     lumbar 4 and 5    BIOPSY  02/05/2012   RMR:Two tongues of salmon-colored epithelium distal  esophagus, very short-segment Barrett's s/p bx/Small hiatal hernia, otherwise normal stomach, D1, D2. Status post esophageal dilation. Biopsy showed GERD.   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 Shaaron- schatzki's ring, shoft peptic stricture at GE junction.   ESOPHAGOGASTRODUODENOSCOPY (EGD) WITH PROPOFOL  N/A 02/08/2018   Procedure: ESOPHAGOGASTRODUODENOSCOPY (EGD) WITH PROPOFOL ;  Surgeon: Shaaron Lamar HERO, MD;  Location: [facility];  Service: Endoscopy;  Laterality: N/A;  8:15am   FOOT SURGERY     bilateral bunionectomy   HERNIA REPAIR     with mesh   LAPAROTOMY  2007   small bowel resection secondary to small bowel obstruction   MALONEY DILATION  02/05/2012   Procedure: AGAPITO DILATION;  Surgeon: Lamar HERO Shaaron, MD;  Location: [facility];  Service: Endoscopy;  Laterality: N/A;  56mm    MALONEY DILATION N/A 02/08/2018   Procedure: AGAPITO DILATION;  Surgeon: Shaaron Lamar HERO, MD;  Location: [facility];  Service: Endoscopy;  Laterality: N/A;   ORIF TIBIA PLATEAU Right 06/18/2023   Procedure: OPEN REDUCTION INTERNAL FIXATION (ORIF) TIBIAL PLATEAU;  Surgeon: Onesimo Oneil LABOR, MD;  Location: [facility];  Service: Orthopedics;  Laterality: Right;   Teeth removal     Family History Family History  Problem  Relation Age of Onset   Stroke Mother    Cancer Father    Anesthesia problems Neg Hx    Hypotension Neg Hx    Malignant hyperthermia Neg Hx    Pseudochol deficiency Neg Hx     Social History Social History   Tobacco Use   Smoking status: Former    Current packs/day: 0.00    Average packs/day: 1 pack/day for 20.0 years (20.0 ttl pk-yrs)    Types: Cigarettes    Start date: 10/20/1968    Quit date: 10/20/1988    Years since quitting: 35.5   Smokeless tobacco: Never  Vaping Use   Vaping status: Never Used  Substance Use Topics   Alcohol use: No    Alcohol/week: 0.0 standard drinks of alcohol   Drug use: No   Allergies Penicillin g, Penicillins, and Dust mite extract  Review of Systems A thorough review of systems was obtained and all systems are negative except as noted in the HPI and PMH.   Physical Exam Vital Signs  I have reviewed the triage vital signs BP 106/70 (BP Location: Right Arm)   Pulse 61   Temp 98 F (36.7 C) (Oral)   Resp 18   Ht 5' 8 (1.727 m)   Wt 64.4 kg   SpO2 100%   BMI 21.59 kg/m  Physical Exam Vitals and nursing note reviewed.  Constitutional:      General: She is in acute distress.     Appearance: Normal appearance.  HENT:     Head: Normocephalic and atraumatic. No right periorbital erythema or left periorbital erythema.     Jaw: There is normal jaw occlusion.     Comments: Perioral cyanosis    Right Ear: External ear normal.     Left Ear: External ear normal.     Nose: Nose normal.  Mouth/Throat:     Mouth: Mucous membranes are moist.  Eyes:     General: No scleral icterus.       Right eye: No discharge.        Left eye: No discharge.  Cardiovascular:     Rate and Rhythm: Normal rate and regular rhythm.     Pulses: Normal pulses.  Pulmonary:     Effort: Tachypnea and respiratory distress present.     Breath sounds: Decreased air movement present. No stridor. Rales present.  Abdominal:     General: Abdomen is flat. There is no  distension.     Palpations: Abdomen is soft.     Tenderness: There is no abdominal tenderness.  Musculoskeletal:     Cervical back: No rigidity.     Right lower leg: Edema present.     Left lower leg: Edema present.  Skin:    General: Skin is warm and dry.     Capillary Refill: Capillary refill takes less than 2 seconds.  Neurological:     Mental Status: She is lethargic.     GCS: GCS eye subscore is 4. GCS verbal subscore is 5. GCS motor subscore is 6.     Cranial Nerves: Cranial nerves 2-12 are intact. No facial asymmetry.     Sensory: Sensation is intact. No sensory deficit.     Motor: Motor function is intact. No tremor.     Coordination: Coordination is intact.     Comments: Gait testing deferred secondary to patient safety. Strength 5/5 to BLUE/BLLE, equal and symmetric     Psychiatric:        Mood and Affect: Mood normal.        Behavior: Behavior normal. Behavior is cooperative.     ED Results and Treatments Labs (all labs ordered are listed, but only abnormal results are displayed) Labs Reviewed  BLOOD GAS, VENOUS - Abnormal; Notable for the following components:      Result Value   pH, Ven 7.23 (*)    pCO2, Ven 78 (*)    pO2, Ven <31 (*)    Bicarbonate 33.1 (*)    Acid-Base Excess 4.0 (*)    All other components within normal limits  BRAIN NATRIURETIC PEPTIDE - Abnormal; Notable for the following components:   B Natriuretic Peptide 1,203.0 (*)    All other components within normal limits  CBC WITH DIFFERENTIAL/PLATELET - Abnormal; Notable for the following components:   RBC 3.54 (*)    Hemoglobin 9.8 (*)    HCT 32.5 (*)    RDW 15.9 (*)    Abs Immature Granulocytes 0.08 (*)    All other components within normal limits  COMPREHENSIVE METABOLIC PANEL WITH GFR - Abnormal; Notable for the following components:   Glucose, Bld 102 (*)    Creatinine, Ser 2.02 (*)    Calcium  8.6 (*)    GFR, Estimated 26 (*)    All other components within normal limits  TROPONIN  I (HIGH SENSITIVITY) - Abnormal; Notable for the following components:   Troponin I (High Sensitivity) 19 (*)    All other components within normal limits  RESP PANEL BY RT-PCR (RSV, FLU A&B, COVID)  RVPGX2  LACTIC ACID, PLASMA  AMMONIA  LACTIC ACID, PLASMA  URINALYSIS, ROUTINE W REFLEX MICROSCOPIC  PROTIME-INR  TROPONIN I (HIGH SENSITIVITY)  Radiology DG Chest Port 1 View Result Date: 05/05/2024 CLINICAL DATA:  Difficulty breathing EXAM: PORTABLE CHEST 1 VIEW COMPARISON:  Chest x-ray 09/21/2023 FINDINGS: The cardiac silhouette is enlarged, unchanged. Diffuse interstitial prominence persists. Small to moderate right pleural effusion has increased. No pneumothorax or acute fracture identified. Sternotomy wires and prosthetic heart valve again noted. IMPRESSION: 1. Cardiomegaly with diffuse interstitial prominence, likely pulmonary edema. 2. Increasing small to moderate right pleural effusion. Electronically Signed   By: Greig Pique M.D.   On: 05/05/2024 17:34    Pertinent labs & imaging results that were available during my care of the patient were reviewed by me and considered in my medical decision making (see MDM for details).  Medications Ordered in ED Medications  ipratropium-albuterol  (DUONEB) 0.5-2.5 (3) MG/3ML nebulizer solution 3 mL (has no administration in time range)  ipratropium-albuterol  (DUONEB) 0.5-2.5 (3) MG/3ML nebulizer solution 3 mL (3 mLs Nebulization Given 05/05/24 1742)  methylPREDNISolone  sodium succinate (SOLU-MEDROL ) 125 mg/2 mL injection 125 mg (125 mg Intravenous Given 05/05/24 1748)  furosemide  (LASIX ) injection 40 mg (40 mg Intravenous Given 05/05/24 1816)                                                                                                                                     Procedures .Critical Care  Performed by: Elnor Jayson LABOR,  DO Authorized by: Elnor Jayson LABOR, DO   Critical care provider statement:    Critical care time (minutes):  45   Critical care time was exclusive of:  Separately billable procedures and treating other patients   Critical care was necessary to treat or prevent imminent or life-threatening deterioration of the following conditions:  Respiratory failure and dehydration   Critical care was time spent personally by me on the following activities:  Development of treatment plan with patient or surrogate, discussions with consultants, evaluation of patient's response to treatment, examination of patient, ordering and review of laboratory studies, ordering and review of radiographic studies, ordering and performing treatments and interventions, pulse oximetry, re-evaluation of patient's condition, review of old charts and obtaining history from patient or surrogate   Care discussed with: admitting provider     (including critical care time)  Medical Decision Making / ED Course    Medical Decision Making:    CHERON CORYELL is a 73 y.o. female  with past medical history as below, significant for atrial fibrillation, diastolic heart failure, HLD, who presents to the ED with complaint of dib. The complaint involves an extensive differential diagnosis and also carries with it a high risk of complications and morbidity.  Serious etiology was considered. Ddx includes but is not limited to: In my evaluation of this patient's dyspnea my DDx includes, but is not limited to, pneumonia, pulmonary embolism, pneumothorax, pulmonary edema, metabolic acidosis, asthma, COPD, cardiac cause, anemia, anxiety, etc.    Complete initial physical exam performed, notably the patient was in acute resp  distress, cyanotic.    Reviewed and confirmed nursing documentation for past medical history, family history, social history.  Vital signs reviewed.    Decompensated CHF Acute respiratory failure with hypoxia and  hypercapnia> - Patient hypoxic, pulse ox mid 70s on 4 L nasal cannula (home o2), lethargic - Patient placed on BiPAP, mental status greatly improved, cyanosis resolved, pulse ox 98% - She has rales on exam, appears edematous, chest x-ray with fluid overload, BNP >1k.  Start Lasix , continue BiPAP  - trop leak at 19, likely demand ischemia from hypoxia, no cp, recommend trend - Echocardiogram 04/28/2024 LVEF 60-6 5%,  AKI > - Cr 1.4 > 2.02 (today) - report of poor po intake last few days - gentle rehydration in setting of CHF exacerbation   Generalized weaknesss > - likely 2/2 resp fail, aki - neuro exam is non-focal       Admit to hospitalist               Additional history obtained: -Additional history obtained from family -External records from outside source obtained and reviewed including: Chart review including previous notes, labs, imaging, consultation notes including  Echocardiogram 04/28/2024 LVEF 60-6 5%,   Lab Tests: -I ordered, reviewed, and interpreted labs.   The pertinent results include:   Labs Reviewed  BLOOD GAS, VENOUS - Abnormal; Notable for the following components:      Result Value   pH, Ven 7.23 (*)    pCO2, Ven 78 (*)    pO2, Ven <31 (*)    Bicarbonate 33.1 (*)    Acid-Base Excess 4.0 (*)    All other components within normal limits  BRAIN NATRIURETIC PEPTIDE - Abnormal; Notable for the following components:   B Natriuretic Peptide 1,203.0 (*)    All other components within normal limits  CBC WITH DIFFERENTIAL/PLATELET - Abnormal; Notable for the following components:   RBC 3.54 (*)    Hemoglobin 9.8 (*)    HCT 32.5 (*)    RDW 15.9 (*)    Abs Immature Granulocytes 0.08 (*)    All other components within normal limits  COMPREHENSIVE METABOLIC PANEL WITH GFR - Abnormal; Notable for the following components:   Glucose, Bld 102 (*)    Creatinine, Ser 2.02 (*)    Calcium  8.6 (*)    GFR, Estimated 26 (*)    All other components  within normal limits  TROPONIN I (HIGH SENSITIVITY) - Abnormal; Notable for the following components:   Troponin I (High Sensitivity) 19 (*)    All other components within normal limits  RESP PANEL BY RT-PCR (RSV, FLU A&B, COVID)  RVPGX2  LACTIC ACID, PLASMA  AMMONIA  LACTIC ACID, PLASMA  URINALYSIS, ROUTINE W REFLEX MICROSCOPIC  PROTIME-INR  TROPONIN I (HIGH SENSITIVITY)    Notable for as above  EKG   EKG Interpretation Date/Time:    Ventricular Rate:    PR Interval:    QRS Duration:    QT Interval:    QTC Calculation:   R Axis:      Text Interpretation:           Imaging Studies ordered: I ordered imaging studies including CXR I independently visualized the following imaging with scope of interpretation limited to determining acute life threatening conditions related to emergency care; findings noted above I agree with the radiologist interpretation If any imaging was obtained with contrast I closely monitored patient for any possible adverse reaction a/w contrast administration in the emergency department   Medicines ordered and  prescription drug management: Meds ordered this encounter  Medications   ipratropium-albuterol  (DUONEB) 0.5-2.5 (3) MG/3ML nebulizer solution 3 mL   methylPREDNISolone  sodium succinate (SOLU-MEDROL ) 125 mg/2 mL injection 125 mg   furosemide  (LASIX ) injection 40 mg   ipratropium-albuterol  (DUONEB) 0.5-2.5 (3) MG/3ML nebulizer solution 3 mL    -I have reviewed the patients home medicines and have made adjustments as needed   Consultations Obtained: I requested consultation with the hospitalist,  and discussed lab and imaging findings as well as pertinent plan    Cardiac Monitoring: The patient was maintained on a cardiac monitor.  I personally viewed and interpreted the cardiac monitored which showed an underlying rhythm of: nsr Continuous pulse oximetry interpreted by myself, 80% on 4LNC.    Social Determinants of Health:   Diagnosis or treatment significantly limited by social determinants of health: former smoker   Reevaluation: After the interventions noted above, I reevaluated the patient and found that they have improved  Co morbidities that complicate the patient evaluation  Past Medical History:  Diagnosis Date   Anxiety disorder    Aortic stenosis    Status post St. Jude mechanical AVR 2007   Asthma    Atrial fibrillation (HCC)    Carcinoid tumor of colon 2007   Chronic diastolic heart failure (HCC)    Coronary atherosclerosis of native coronary artery    Status post CABG 2007   GERD (gastroesophageal reflux disease)    History of colonoscopy 2003   Dr. 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 Shaaron, benign biopsies   RLS (restless legs syndrome)    Schatzki's ring       Dispostion: Disposition decision including need for hospitalization was considered, and patient admitted to the hospital.    Final Clinical Impression(s) / ED Diagnoses Final diagnoses:  Acute congestive heart failure, unspecified heart failure type (HCC)  AKI (acute kidney injury) (HCC)  Acute respiratory failure with hypoxia and hypercapnia (HCC)        Elnor Jayson LABOR, DO 05/05/24 1851

## 2024-05-05 NOTE — ED Triage Notes (Signed)
 Per EMS, pt son called them out bc pt woke up today and not acting right. Is on 4L O2 regularly, but is on 6L at this time from EMS. Hx of COPD, CHF, and A.Fib. Takes warfarin.

## 2024-05-06 DIAGNOSIS — I509 Heart failure, unspecified: Secondary | ICD-10-CM

## 2024-05-06 DIAGNOSIS — I480 Paroxysmal atrial fibrillation: Secondary | ICD-10-CM | POA: Diagnosis not present

## 2024-05-06 DIAGNOSIS — J9690 Respiratory failure, unspecified, unspecified whether with hypoxia or hypercapnia: Secondary | ICD-10-CM

## 2024-05-06 DIAGNOSIS — J441 Chronic obstructive pulmonary disease with (acute) exacerbation: Secondary | ICD-10-CM | POA: Diagnosis not present

## 2024-05-06 DIAGNOSIS — J9621 Acute and chronic respiratory failure with hypoxia: Secondary | ICD-10-CM | POA: Diagnosis not present

## 2024-05-06 DIAGNOSIS — I5031 Acute diastolic (congestive) heart failure: Secondary | ICD-10-CM | POA: Diagnosis not present

## 2024-05-06 DIAGNOSIS — N179 Acute kidney failure, unspecified: Secondary | ICD-10-CM

## 2024-05-06 DIAGNOSIS — Z952 Presence of prosthetic heart valve: Secondary | ICD-10-CM

## 2024-05-06 DIAGNOSIS — N183 Chronic kidney disease, stage 3 unspecified: Secondary | ICD-10-CM

## 2024-05-06 DIAGNOSIS — E039 Hypothyroidism, unspecified: Secondary | ICD-10-CM

## 2024-05-06 DIAGNOSIS — I25708 Atherosclerosis of coronary artery bypass graft(s), unspecified, with other forms of angina pectoris: Secondary | ICD-10-CM | POA: Diagnosis not present

## 2024-05-06 DIAGNOSIS — I2510 Atherosclerotic heart disease of native coronary artery without angina pectoris: Secondary | ICD-10-CM | POA: Diagnosis not present

## 2024-05-06 DIAGNOSIS — E785 Hyperlipidemia, unspecified: Secondary | ICD-10-CM

## 2024-05-06 DIAGNOSIS — N1832 Chronic kidney disease, stage 3b: Secondary | ICD-10-CM

## 2024-05-06 LAB — CBC
HCT: 31.9 % — ABNORMAL LOW (ref 36.0–46.0)
Hemoglobin: 9.2 g/dL — ABNORMAL LOW (ref 12.0–15.0)
MCH: 26.5 pg (ref 26.0–34.0)
MCHC: 28.8 g/dL — ABNORMAL LOW (ref 30.0–36.0)
MCV: 91.9 fL (ref 80.0–100.0)
Platelets: 156 K/uL (ref 150–400)
RBC: 3.47 MIL/uL — ABNORMAL LOW (ref 3.87–5.11)
RDW: 15.8 % — ABNORMAL HIGH (ref 11.5–15.5)
WBC: 5.6 K/uL (ref 4.0–10.5)
nRBC: 0 % (ref 0.0–0.2)

## 2024-05-06 LAB — BASIC METABOLIC PANEL WITH GFR
Anion gap: 13 (ref 5–15)
BUN: 27 mg/dL — ABNORMAL HIGH (ref 8–23)
CO2: 28 mmol/L (ref 22–32)
Calcium: 8.3 mg/dL — ABNORMAL LOW (ref 8.9–10.3)
Chloride: 99 mmol/L (ref 98–111)
Creatinine, Ser: 2.18 mg/dL — ABNORMAL HIGH (ref 0.44–1.00)
GFR, Estimated: 23 mL/min — ABNORMAL LOW (ref >60)
Glucose, Bld: 135 mg/dL — ABNORMAL HIGH (ref 70–99)
Potassium: 3.5 mmol/L (ref 3.5–5.1)
Sodium: 140 mmol/L (ref 135–145)

## 2024-05-06 LAB — PROTIME-INR
INR: 4.4 (ref 0.8–1.2)
INR: 4.6 (ref 0.8–1.2)
Prothrombin Time: 44.1 s — ABNORMAL HIGH (ref 11.4–15.2)
Prothrombin Time: 45.4 s — ABNORMAL HIGH (ref 11.4–15.2)

## 2024-05-06 LAB — URINALYSIS, ROUTINE W REFLEX MICROSCOPIC
Bilirubin Urine: NEGATIVE
Glucose, UA: 150 mg/dL — AB
Hgb urine dipstick: NEGATIVE
Ketones, ur: NEGATIVE mg/dL
Leukocytes,Ua: NEGATIVE
Nitrite: NEGATIVE
Protein, ur: NEGATIVE mg/dL
Specific Gravity, Urine: 1.008 (ref 1.005–1.030)
pH: 5 (ref 5.0–8.0)

## 2024-05-06 LAB — MRSA NEXT GEN BY PCR, NASAL: MRSA by PCR Next Gen: NOT DETECTED

## 2024-05-06 MED ORDER — HYDROCERIN EX CREA
TOPICAL_CREAM | Freq: Two times a day (BID) | CUTANEOUS | Status: DC
  Administered 2024-05-09: 1 via TOPICAL
  Filled 2024-05-06: qty 113

## 2024-05-06 MED ORDER — POTASSIUM CHLORIDE CRYS ER 20 MEQ PO TBCR
40.0000 meq | EXTENDED_RELEASE_TABLET | Freq: Once | ORAL | Status: AC
  Administered 2024-05-06: 40 meq via ORAL
  Filled 2024-05-06: qty 2

## 2024-05-06 MED ORDER — FUROSEMIDE 10 MG/ML IJ SOLN
80.0000 mg | Freq: Once | INTRAMUSCULAR | Status: AC
  Administered 2024-05-06: 80 mg via INTRAVENOUS
  Filled 2024-05-06: qty 8

## 2024-05-06 NOTE — Evaluation (Addendum)
 Physical Therapy Evaluation Patient Details Name: [name] MRN: [id number] DOB: 07/16/1951 Today's Date: 05/06/2024  History of Present Illness  [name]  is a 73 y.o. female,  with longstanding history of COPD and chronic respiratory failure typically on 4 L/min supplemental oxygen , chronic atrial fibrillation, HTN, HLD, CAD status post 2V CABG with mechanical aortic valve replacement, patient on home oxygen , not on BiPAP or CPAP at home, patient most recent hospitalization was at [facility] for respiratory failure secondary to CHF and COPD.  - She presents today secondary to complaints of dyspnea, continuous and deconditioning, son reports she was unable to get out of bed secondary to difficulty breathing, weakness, has been compliant with her home medication including torsemide , no cough, no fever, no chest pain, no vomiting.  - In ED she was with significant increased work of breathing, respiratory distress, ABG was obtained, showing significant respiratory acidosis with PCO2 of 78, labs significant for worsening creatinine at 2, BNP significantly elevated at 1203, chest x-ray with evidence of volume overload, lactic acid within normal limit, normal white blood cell count, INR elevated at 4.4, patient was started on BiPAP with improvement of her symptoms, [facility] hospitalist consulted to admit   Clinical Impression  Patient demonstrates slow labored movement for sitting up at bedside secondary to weakness. Patient demonstrated increased need for assistance with transfers and ambulation using a RW being limited mostly by fatigue and weakness. Patient tolerated sitting up in the chair after therapy. Patient will benefit from continued skilled physical therapy in hospital and recommended venue below to increase strength, balance, endurance for safe ADLs and gait.       If plan is discharge home, recommend the following: A lot of help with bathing/dressing/bathroom;A lot of help with walking  and/or transfers;Assistance with cooking/housework;Assist for transportation;Help with stairs or ramp for entrance   Can travel by private vehicle   Yes    Equipment Recommendations None recommended by PT  Recommendations for Other Services       Functional Status Assessment Patient has had a recent decline in their functional status and demonstrates the ability to make significant improvements in function in a reasonable and predictable amount of time.     Precautions / Restrictions Precautions Precautions: Fall Recall of Precautions/Restrictions: Intact Restrictions Weight Bearing Restrictions Per Provider Order: No      Mobility  Bed Mobility Overal bed mobility: Needs Assistance Bed Mobility: Supine to Sit     Supine to sit: Min assist, HOB elevated     General bed mobility comments: Single hand held assist to pull to sit.    Transfers Overall transfer level: Needs assistance Equipment used: Rolling walker (2 wheels) Transfers: Sit to/from Stand, Bed to chair/wheelchair/BSC Sit to Stand: Mod assist   Step pivot transfers: Min assist, Mod assist       General transfer comment: EOB to chair transfer with RW. Labored movement; unsteady.    Ambulation/Gait Ambulation/Gait assistance: Mod assist Gait Distance (Feet): 10 Feet Assistive device: Rolling walker (2 wheels) Gait Pattern/deviations: Decreased step length - left, Decreased step length - right, Decreased stride length Gait velocity: decreased     General Gait Details: unsteady, labored movment  Stairs            Wheelchair Mobility     Tilt Bed    Modified Rankin (Stroke Patients Only)       Balance Overall balance assessment: Needs assistance Sitting-balance support: Feet supported, No upper extremity supported Sitting balance-Leahy Scale: Good Sitting  balance - Comments: fair to good seated at EOB   Standing balance support: Bilateral upper extremity supported, During functional  activity, Reliant on assistive device for balance Standing balance-Leahy Scale: Poor Standing balance comment: poor with RW                             Pertinent Vitals/Pain Pain Assessment Pain Assessment: No/denies pain    Home Living Family/patient expects to be discharged to:: Private residence Living Arrangements: Children Available Help at Discharge: Family Type of Home: Mobile home Home Access: Stairs to enter Entrance Stairs-Rails: Can reach both Entrance Stairs-Number of Steps: 2   Home Layout: One level Home Equipment: Agricultural consultant (2 wheels);BSC/3in1;Wheelchair - manual Additional Comments: Son only available PRN.    Prior Function Prior Level of Function : Independent/Modified Independent             Mobility Comments: Household ambulator using SPC. ADLs Comments: Pt reports independence with ADL's. Pt reports she can cook and clean. Does not drive.     Extremity/Trunk Assessment   Upper Extremity Assessment Upper Extremity Assessment: Defer to OT evaluation    Lower Extremity Assessment Lower Extremity Assessment: Generalized weakness    Cervical / Trunk Assessment Cervical / Trunk Assessment: Normal  Communication   Communication Communication: Impaired Factors Affecting Communication: Hearing impaired    Cognition Arousal: Alert Behavior During Therapy: WFL for tasks assessed/performed   PT - Cognitive impairments: No apparent impairments                         Following commands: Intact       Cueing Cueing Techniques: Verbal cues     General Comments General comments (skin integrity, edema, etc.): wounds on BIL LE, bandage on R LE    Exercises     Assessment/Plan    PT Assessment Patient needs continued PT services  PT Problem List Decreased strength;Decreased activity tolerance;Decreased balance;Decreased mobility       PT Treatment Interventions DME instruction;Gait training;Stair  training;Functional mobility training;Therapeutic activities;Balance training;Therapeutic exercise;Patient/family education    PT Goals (Current goals can be found in the Care Plan section)  Acute Rehab PT Goals Patient Stated Goal: return home after rehab PT Goal Formulation: With patient Time For Goal Achievement: 05/20/24 Potential to Achieve Goals: Good    Frequency Min 3X/week     Co-evaluation PT/OT/SLP Co-Evaluation/Treatment: Yes Reason for Co-Treatment: To address functional/ADL transfers PT goals addressed during session: Mobility/safety with mobility OT goals addressed during session: ADL's and self-care       AM-PAC PT 6 Clicks Mobility  Outcome Measure Help needed turning from your back to your side while in a flat bed without using bedrails?: A Little Help needed moving from lying on your back to sitting on the side of a flat bed without using bedrails?: A Little Help needed moving to and from a bed to a chair (including a wheelchair)?: A Lot Help needed standing up from a chair using your arms (e.g., wheelchair or bedside chair)?: A Lot Help needed to walk in hospital room?: A Lot Help needed climbing 3-5 steps with a railing? : A Lot 6 Click Score: 14    End of Session   Activity Tolerance: Patient limited by fatigue Patient left: in chair Nurse Communication: Mobility status PT Visit Diagnosis: Unsteadiness on feet (R26.81);Other abnormalities of gait and mobility (R26.89);Muscle weakness (generalized) (M62.81)    Time: 9099-9072 PT Time  Calculation (min) (ACUTE ONLY): 27 min   Charges:   PT Evaluation $PT Eval Moderate Complexity: 1 Mod PT Treatments $Therapeutic Activity: 23-37 mins PT General Charges $$ ACUTE PT VISIT: 1 Visit        Skyla Champagne, SPT

## 2024-05-06 NOTE — Progress Notes (Signed)
 Critical result Documentation  05/06/24 0729  Provider Notification  Provider Name/Title Dr. Ricky Fines  Date Provider Notified 05/06/24  Time Provider Notified [phone]  Method of Notification Page (secure chat)  Notification Reason Critical Result  Test performed and critical result INR 4.6  Date Critical Result Received 05/06/24  Time Critical Result Received 0715  Provider response No new orders  Date of Provider Response 05/06/24  Time of Provider Response 0730

## 2024-05-06 NOTE — Plan of Care (Signed)
  Problem: Acute Rehab OT Goals (only OT should resolve) Goal: Pt. Will Perform Grooming Flowsheets (Taken 05/06/2024 1143) Pt Will Perform Grooming:  with modified independence  standing Goal: Pt. Will Transfer To Toilet Flowsheets (Taken 05/06/2024 1143) Pt Will Transfer to Toilet:  with modified independence  ambulating Goal: Pt. Will Perform Toileting-Clothing Manipulation Flowsheets (Taken 05/06/2024 1143) Pt Will Perform Toileting - Clothing Manipulation and hygiene:  with modified independence  sitting/lateral leans Goal: Pt/Caregiver Will Perform Home Exercise Program Flowsheets (Taken 05/06/2024 1143) Pt/caregiver will Perform Home Exercise Program:  Increased strength  Increased ROM  Both right and left upper extremity  Independently  Lesbia Ottaway OT, MOT

## 2024-05-06 NOTE — Consult Note (Addendum)
 Cardiology Consultation   Patient ID: DANYELE SMEJKAL MRN: [id number]; DOB: 01/31/1951  Admit date: 05/05/2024 Date of Consult: 05/06/2024  PCP:  Renato Dorothey HERO, NP   [facility] Providers Cardiologist:  Jayson Sierras, MD        Patient Profile: ERIAL FIKES is a 73 y.o. female with a hx of aortic stenosis (s/p St. Jude mechanical AVR in 2007 with repair of ascending aortic aneurysm), CAD with prior CABG, permanent atrial fibrillation, chronic HFpEF, HTN, HLD, Stage 3 CKD, possible LA mass (noted on echo in 08/2023 and outpatient Cardiac CT recommended once clinically stable but never obtained), COPD and chronic hypoxic respiratory failure who is being seen 05/06/2024 for the evaluation of acute CHF at the request of Dr. Ricky.  History of Present Illness: Ms. Norby was last examined by Almarie Crate, NP in 02/2024 following a recent hospitalization for acute on chronic hypoxic respiratory failure in the setting of a COPD exacerbation and CHF exacerbation. Had ultimately been discharged to SNF for rehab. At the time of follow-up, she was still on 3.5 L nasal cannula but denied any chest pain or palpitations. She was continued on Farxiga  10 mg daily, Cardizem  CD 120 mg daily, Toprol -XL 50 mg daily, Crestor  20 mg daily and Torsemide  100 mg daily along with Coumadin  for anticoagulation. A repeat echocardiogram was recommended for further assessment of her possible left atrial mass. This was performed on 04/29/2024 and showed a preserved EF of 60 to 65% with no regional wall motion abnormalities. She did have mild LVH and RV function was not well-visualized. She did have elevated RV pressure and volume overload and moderately elevated PASP at 58.1 mmHg. A 3.4 x 2.4 cm round density adjacent to the interatrial septum within the left atrium was again visualized and was recommended to consider TEE or cardiac CT for further assessment. Her St. Jude mechanical valve was functioning  normally with no stenosis or regurgitation.  She presented to [facility] ED yesterday afternoon for evaluation of worsening shortness of breath. She was in respiratory distress upon arrival and placed on BiPAP. Initial labs showed WBC 6.8, Hgb 9.8 (close to baseline), platelets 199, Na+ 141, K+ 3.5 and creatinine 2.02 (previously 1.51 on 01/01/2024).  AST 26 and ALT 14.  Lactic acid normal at 1.1 and 1.6. BNP elevated at 1203. Venous blood gas showed PCO2 at 78 and PO2 less than 31 with pH 7.23. Respiratory panel was negative for COVID, influenza and RSV. CXR showed cardiomegaly with diffuse interstitial prominence likely representing pulmonary edema and a small to moderate right pleural effusion. EKG showed atrial fibrillation, heart rate 65 with baseline artifact but no definitive ST changes.  She was started on IV diuresis and received 40 mg IV Lasix  x 2 while in the ED along with Metolazone  5 mg x 1. Scheduled to start 80 mg twice daily today. Appears peripheral Levophed  was ordered but never initiated. She was started on IV steroids and antibiotics for COPD exacerbation.  She has a recorded net output of -350 mL thus far. Repeat labs today show creatinine has trended up slightly to 2.18. K+ 3.5. Weight at 150 lbs (was 139 lbs in 02/2024).   In talking with the patient today, she is unable to provide a timeframe for how long her shortness of breath has been occurring. Reports more symptoms with exertion. Uses a cane or walker at home. No recent chest pain or palpitations. No reported orthopnea, PND or pitting edema. Says her weight has  increased on her home scales but this has been a gradual change given improvement in her appetite.    Past Medical History:  Diagnosis Date   Anxiety disorder    Aortic stenosis    Status post St. Jude mechanical AVR 2007   Asthma    Atrial fibrillation (HCC)    Carcinoid tumor of colon 2007   Chronic diastolic heart failure (HCC)    Coronary atherosclerosis of  native coronary artery    Status post CABG 2007   GERD (gastroesophageal reflux disease)    History of colonoscopy 2003   Dr. 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 Shaaron, benign biopsies   RLS (restless legs syndrome)    Schatzki's ring     Past Surgical History:  Procedure Laterality Date   ABDOMINAL HYSTERECTOMY     AORTIC VALVE REPLACEMENT  2007   #25 mm St. Jude mechanical prosthesis with Hemashield tube graft repair of ascending aneurysm   APPENDECTOMY  2007   BACK SURGERY     lumbar 4 and 5    BIOPSY  02/05/2012   RMR:Two tongues of salmon-colored epithelium distal esophagus, very short-segment Barrett's s/p bx/Small hiatal hernia, otherwise normal stomach, D1, D2. Status post esophageal dilation. Biopsy showed GERD.   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 Shaaron- schatzki's ring, shoft peptic stricture at GE junction.   ESOPHAGOGASTRODUODENOSCOPY (EGD) WITH PROPOFOL  N/A 02/08/2018   Procedure: ESOPHAGOGASTRODUODENOSCOPY (EGD) WITH PROPOFOL ;  Surgeon: Shaaron Lamar HERO, MD;  Location: [facility];  Service: Endoscopy;  Laterality: N/A;  8:15am   FOOT SURGERY     bilateral bunionectomy   HERNIA REPAIR     with mesh   LAPAROTOMY  2007   small bowel resection secondary to small bowel obstruction   MALONEY DILATION  02/05/2012   Procedure: AGAPITO DILATION;  Surgeon: Lamar HERO Shaaron, MD;  Location: [facility] ORS;  Service: Endoscopy;  Laterality: N/A;  56mm    MALONEY DILATION N/A 02/08/2018   Procedure: AGAPITO DILATION;  Surgeon: Shaaron Lamar HERO, MD;  Location: [facility];  Service: Endoscopy;  Laterality: N/A;   ORIF TIBIA PLATEAU Right 06/18/2023   Procedure: OPEN REDUCTION INTERNAL FIXATION (ORIF) TIBIAL PLATEAU;  Surgeon: Onesimo Oneil LABOR, MD;  Location: [facility];  Service: Orthopedics;  Laterality: Right;   Teeth removal       Home Medications:  Prior to Admission medications   Medication Sig Start Date End Date Taking? Authorizing Provider  albuterol  (VENTOLIN  HFA) 108 (90 Base) MCG/ACT inhaler Inhale 1-2 puffs into the lungs daily as needed for wheezing or shortness of breath. 01/19/24  Yes [provider]  allopurinol  (ZYLOPRIM ) 100 MG tablet Take 100 mg by mouth daily.   Yes [provider]  ALPRAZolam  (XANAX ) 0.5 MG tablet Take 1 tablet (0.5 mg total) by mouth 2 (two) times daily. 09/23/23  Yes Shahmehdi, Seyed A, MD  CAPLYTA  42 MG capsule Take 42 mg by mouth at bedtime. 12/09/23  Yes [provider]  diltiazem  (CARDIZEM  CD) 120 MG 24 hr capsule Take 1 capsule (120 mg total) by mouth daily. 04/01/18  Yes Ricky Fines, MD  FARXIGA  10 MG TABS tablet Take 10 mg by mouth daily. 01/21/21  Yes [provider]  ipratropium (ATROVENT ) 0.03 % nasal spray Place 2 sprays into both nostrils 2 (two) times daily as needed for rhinitis.  05/26/24 Yes [provider]  levothyroxine  (SYNTHROID , LEVOTHROID) 50 MCG tablet Take 50 mcg by mouth daily before breakfast.    Yes [provider]  metoprolol  succinate (TOPROL -XL) 50 MG 24 hr tablet Take 50 mg by mouth daily.   Yes [provider]  Multiple Vitamin (MULTIVITAMIN WITH MINERALS) TABS tablet Take 1 tablet by mouth daily.   Yes [provider]  omeprazole  (PRILOSEC) 20 MG capsule Take 20 mg by mouth daily.   Yes [provider]  OXYGEN  Inhale 4 L into the lungs daily.   Yes [provider]  rOPINIRole  (REQUIP ) 3 MG tablet Take 1 tablet (3 mg total) by mouth 2 (two) times daily. 09/23/23  Yes Shahmehdi, Adriana LABOR, MD  rosuvastatin   (CRESTOR ) 20 MG tablet TAKE ONE (1) TABLET BY MOUTH EVERY DAY 02/06/23  Yes Debera Jayson MATSU, MD  torsemide  (DEMADEX ) 100 MG tablet Take 100 mg by mouth daily.   Yes [provider]  warfarin (COUMADIN ) 2 MG tablet Take 1.5-2 tablets (3-4 mg total) by mouth See admin instructions. 4mg  on Tuesday and 3 mg daily the rest of the week. Patient taking differently: Take 3-4 mg by mouth See admin instructions. Take 3 mg by mouth every day 09/06/18  Yes Ricky Fines, MD    Scheduled Meds:  allopurinol   100 mg Oral Daily   ALPRAZolam   0.5 mg Oral BID   Chlorhexidine  Gluconate Cloth  6 each Topical QHS   diltiazem   120 mg Oral Daily   furosemide   80 mg Intravenous Q12H   hydrocerin   Topical BID   ipratropium-albuterol   3 mL Nebulization Q4H   levothyroxine   50 mcg Oral QAC breakfast   methylPREDNISolone  (SOLU-MEDROL ) injection  125 mg Intravenous Daily   metoprolol  succinate  25 mg Oral Daily   multivitamin with minerals  1 tablet Oral Daily   pantoprazole  (PROTONIX ) IV  40 mg Intravenous Q24H   potassium chloride   40 mEq Oral Once   rOPINIRole   3 mg Oral BID   rosuvastatin   20 mg Oral Daily   Continuous Infusions:  sodium chloride  Stopped (05/05/24 2021)   cefTRIAXone  (ROCEPHIN )  IV Stopped (05/06/24 0015)   norepinephrine  (LEVOPHED ) Adult infusion Stopped (05/05/24 2021)   PRN Meds: acetaminophen  **OR** acetaminophen , albuterol , bisacodyl , hydrALAZINE , magnesium  hydroxide, ondansetron  **OR** ondansetron  (ZOFRAN ) IV  Allergies:    Allergies  Allergen Reactions   Penicillin G Itching   Penicillins Hives, Itching, Swelling and Rash   Dust Mite Extract Other (See Comments)    Seasonal allergies    Social History:   Social History   Socioeconomic History   Marital status: Married    Spouse name: Not on file   Number of children: 1   Years of education: stopped in 10th grade   Highest education level: GED or equivalent  Occupational History   Occupation: Retired   Tobacco Use   Smoking status: Former    Current packs/day: 0.00    Average packs/day: 1 pack/day for 20.0 years (20.0 ttl pk-yrs)    Types: Cigarettes    Start date: 10/20/1968    Quit date: 10/20/1988  Years since quitting: 35.5   Smokeless tobacco: Never  Vaping Use   Vaping status: Never Used  Substance and Sexual Activity   Alcohol use: No    Alcohol/week: 0.0 standard drinks of alcohol   Drug use: No   Sexual activity: Yes    Partners: Male    Birth control/protection: None    Comment: spouse  Other Topics Concern   Not on file  Social History Narrative   Not on file   Social Drivers of Health   Financial Resource Strain: Low Risk  (12/27/2023)   Received from [facility]   Overall Financial Resource Strain (CARDIA)    Difficulty of Paying Living Expenses: Not hard at all  Food Insecurity: No Food Insecurity (12/27/2023)   Received from [facility]   Hunger Vital Sign    Within the past 12 months, you worried that your food would run out before you got the money to buy more.: Never true    Within the past 12 months, the food you bought just didn't last and you didn't have money to get more.: Never true  Transportation Needs: No Transportation Needs (01/08/2024)   Received from [facility]   PRAPARE - Transportation    Lack of Transportation (Medical): No    Lack of Transportation (Non-Medical): No  Recent Concern: Transportation Needs - Unmet Transportation Needs (12/27/2023)   Received from [facility] - Transportation    Lack of Transportation (Medical): Yes    Lack of Transportation (Non-Medical): Yes  Physical Activity: Inactive (12/27/2023)   Received from [facility]   Exercise Vital Sign    On average, how many days per week do you engage in moderate to strenuous exercise (like a brisk walk)?: 0 days    On average, how many minutes do you engage in exercise at this level?: 0 min  Stress: Stress Concern Present (12/27/2023)   Received  from [facility] - Occupational Stress Questionnaire    Feeling of Stress : Rather much  Social Connections: Socially Isolated (12/27/2023)   Received from [facility]   Social Connection and Isolation Panel    In a typical week, how many times do you talk on the phone with family, friends, or neighbors?: Never    How often do you get together with friends or relatives?: More than three times a week    How often do you attend church or religious services?: Never    Do you belong to any clubs or organizations such as church groups, unions, fraternal or athletic groups, or school groups?: No    How often do you attend meetings of the clubs or organizations you belong to?: Never    Are you married, widowed, divorced, separated, never married, or living with a partner?: Widowed  Intimate Partner Violence: Not At Risk (12/27/2023)   Received from [facility]   Humiliation, Afraid, Rape, and Kick questionnaire    Within the last year, have you been afraid of your partner or ex-partner?: No    Within the last year, have you been humiliated or emotionally abused in other ways by your partner or ex-partner?: No    Within the last year, have you been kicked, hit, slapped, or otherwise physically hurt by your partner or ex-partner?: No    Within the last year, have you been raped or forced to have any kind of sexual activity by your partner  or ex-partner?: No    Family History:    Family History  Problem Relation Age of Onset   Stroke Mother    Cancer Father    Anesthesia problems Neg Hx    Hypotension Neg Hx    Malignant hyperthermia Neg Hx    Pseudochol deficiency Neg Hx      ROS:  Please see the history of present illness.   All other ROS reviewed and negative.     Physical Exam/Data: Vitals:   05/06/24 0500 05/06/24 0600 05/06/24 0700 05/06/24 0802  BP: (!) 103/58 102/64 (!) 106/59 (!) 106/54  Pulse: 77 67 81 66  Resp: 19 18 20 16    Temp:    97.6 F (36.4 C)  TempSrc:    Axillary  SpO2: 100% 100% 100% 99%  Weight: 68.1 kg     Height:        Intake/Output Summary (Last 24 hours) at 05/06/2024 0900 Last data filed at 05/06/2024 0824 Gross per 24 hour  Intake 110.5 ml  Output 820 ml  Net -709.5 ml      05/06/2024    5:00 AM 05/05/2024   11:00 PM 05/05/2024    4:27 PM  Last 3 Weights  Weight (lbs) 150 lb 2.1 oz 147 lb 7.8 oz 142 lb  Weight (kg) 68.1 kg 66.9 kg 64.411 kg     Body mass index is 22.83 kg/m.  General: Pleasant elderly female appearing in no acute distress.  HEENT: normal Neck: no JVD Vascular: No carotid bruits; Distal pulses 2+ bilaterally Cardiac:  normal S1, S2; Irregularly irregular; mechanical valve sounds noted.  Lungs: decreased breath sounds along bases. No wheezing.  Abd: soft, nontender, no hepatomegaly  Ext: trace lower extremity edema bilaterally. Dressing in place along right leg.  Musculoskeletal:  No deformities, BUE and BLE strength normal and equal Skin: warm and dry  Neuro:  CNs 2-12 intact, no focal abnormalities noted Psych:  Normal affect   EKG:  The EKG was personally reviewed and demonstrates: Atrial fibrillation, heart rate 65 with baseline artifact but no definitive ST changes.  Telemetry:  Telemetry was personally reviewed and demonstrates: Atrial fibrillation, HR in 70's with occasional PVC's and 3 beats NSVT.   Relevant CV Studies:  Echocardiogram: 04/28/2024 IMPRESSIONS     1. Left ventricular ejection fraction, by estimation, is 60 to 65%. The  left ventricle has normal function. The left ventricle has no regional  wall motion abnormalities. There is mild left ventricular hypertrophy.  Left ventricular diastolic parameters  are indeterminate. There is the interventricular septum is flattened in  systole and diastole, consistent with right ventricular pressure and  volume overload.   2. Right ventricular systolic function was not well visualized. The  right  ventricular size is not well visualized. There is moderately elevated  pulmonary artery systolic pressure.   3. 3.4 x 2.4 cm round density adjacent to the interatrial septum within  the left atrium, limited visualization. Consider TEE or cardiac CT for  further evaluation. . Left atrial size was severely dilated.   4. Right atrial size was severely dilated.   5. The mitral valve is abnormal. Mild mitral valve regurgitation. No  evidence of mitral stenosis. Moderate mitral annular calcification.   6. The tricuspid valve is abnormal. Tricuspid valve regurgitation is  moderate to severe.   7. There is a 25 mm St. Jude mechanical valve present in the aortic  position.     . The aortic valve has been repaired/replaced. Aortic valve  regurgitation is not visualized. No aortic stenosis is present. There is a  25 mm St. Jude mechanical valve present in the aortic position. Procedure  Date: 2007. Aortic valve mean gradient  measures 5.0 mmHg.   8. The inferior vena cava is dilated in size with >50% respiratory  variability, suggesting right atrial pressure of 8 mmHg.   Laboratory Data: High Sensitivity Troponin:   Recent Labs  Lab 05/05/24 1700 05/05/24 1918  TROPONINIHS 19* 18*     Chemistry Recent Labs  Lab 05/05/24 1700 05/06/24 0424  NA 141 140  K 3.5 3.5  CL 100 99  CO2 28 28  GLUCOSE 102* 135*  BUN 23 27*  CREATININE 2.02* 2.18*  CALCIUM  8.6* 8.3*  GFRNONAA 26* 23*  ANIONGAP 13 13    Recent Labs  Lab 05/05/24 1700  PROT 7.4  ALBUMIN 3.7  AST 26  ALT 14  ALKPHOS 77  BILITOT 0.8   Lipids No results for input(s): CHOL, TRIG, HDL, LABVLDL, LDLCALC, CHOLHDL in the last 168 hours.  Hematology Recent Labs  Lab 05/05/24 1700 05/06/24 0424  WBC 6.8 5.6  RBC 3.54* 3.47*  HGB 9.8* 9.2*  HCT 32.5* 31.9*  MCV 91.8 91.9  MCH 27.7 26.5  MCHC 30.2 28.8*  RDW 15.9* 15.8*  PLT 199 156   Thyroid  No results for input(s): TSH, FREET4 in the last  168 hours.  BNP Recent Labs  Lab 05/05/24 1700  BNP 1,203.0*    DDimer No results for input(s): DDIMER in the last 168 hours.  Radiology/Studies:  DG Chest Port 1 View Result Date: 05/05/2024 CLINICAL DATA:  Difficulty breathing EXAM: PORTABLE CHEST 1 VIEW COMPARISON:  Chest x-ray 09/21/2023 FINDINGS: The cardiac silhouette is enlarged, unchanged. Diffuse interstitial prominence persists. Small to moderate right pleural effusion has increased. No pneumothorax or acute fracture identified. Sternotomy wires and prosthetic heart valve again noted. IMPRESSION: 1. Cardiomegaly with diffuse interstitial prominence, likely pulmonary edema. 2. Increasing small to moderate right pleural effusion. Electronically Signed   By: Greig Pique M.D.   On: 05/05/2024 17:34     Assessment and Plan:  1. Acute HFpEF - Presented with worsening dyspnea and weakness --> found to have acute on chronic hypoxic respiratory failure and initially required BiPAP. BNP at 1203 on admission and CXR consistent with CHF.  - While output is recorded at 350 mL, she had an additional 500 mL output this AM and is having some urinary retention by bladder scan.  - Received IV Lasix  40mg  x2 and Metolazone  5mg  while in the ED. Creatinine at 2.18 today. Given her worsening renal function and urinary retention, would hold Farxiga  for now. Would consider risks vs. benefits of resuming prior to discharge given her lower extremity wounds.  - She already received IV Lasix  80mg  this AM. Will review with Dr. Alvan in regards to holding additional doses for this evening and reassessing volume status tomorrow. ReDS vest pending. Would avoid additional Metolazone  for now. No indication to repeat an echo at this time given her recent study on 04/29/2024 (showed EF of 60-65% with elevated RV pressure and volume overload but RV function not well visualized).  2. History of AVR - She is s/p St. Jude mechanical AVR in 2007 with repair of ascending  aortic aneurysm. Recent echo showed normal function of her valve with no stenosis or regurgitation.   3. CAD with prior CABG - She denies any recent anginal symptoms. Hs Troponin values have been flat at 19 and 18. No  plans for ischemic evaluation at this time.  - HLD followed by PCP. Continue Crestor  20mg  daily (LDL at 61 in 2023 but no recent values available for review). Not on ASA given the need for anticoagulation.   4. Permanent Atrial Fibrillation - Rates have been well-controlled in the 70's. She has been continued on Cardizem  CD 120mg  daily and Toprol -XL 25mg  daily (was on 50mg  daily prior to admission but reduced given soft BP). If rates remain well-controlled, would consolidate to a single agent with Cardizem  CD and can titrate as needed. This would be preferred over beta-blocker therapy given her COPD or could switch Toprol -XL to Bisoprolol .   - On Coumadin  for anticoagulation. Currently held given INR of 4.6 today.   5.Possible LA mass  - Noted on echo in 08/2023 and outpatient Cardiac CT recommended once clinically stable but never obtained. Recent echo showed persistent 3.4 x 2.4 cm round density adjacent to the interatrial septum within the left atrium was again visualized and was recommended to consider TEE or cardiac CT for further assessment.  - Would not be an ideal candidate for TEE given her respiratory status. Could again consider Coronary CTA as an outpatient based off respiratory status and renal function. Not a candidate currently given creatinine at 2.18.  6. Stage 3 CKD - Baseline creatinine 1.4 - 1.5. At 2.02 on admission and trending up to 2.18 today. Repeat BMET in AM.   7. COPD/Chronic Hypoxic Respiratory Failure - On 4L [place] at baseline. Initially required BiPAP and now on [place]. Diuresing as above and also being treated with antibiotics and steroids. Management per the admitting team.    Risk Assessment/Risk Scores:  New York  Heart Association (NYHA) Functional  Class NYHA Class III  CHA2DS2-VASc Score = 5   This indicates a 7.2% annual risk of stroke. The patient's score is based upon: CHF History: 1 HTN History: 1 Diabetes History: 0 Stroke History: 0 Vascular Disease History: 1 Age Score: 1 Gender Score: 1    For questions or updates, please contact [facility] Please consult [email or web address] for contact info under    Signed, Laymon CHRISTELLA Qua, PA-C  05/06/2024 9:00 AM   Attending Note Patient seen and discussed with PA Qua, I agree with her documentation.  73 yo female history of aortic stenosis s/p mechanical AVR and ascending aneurysm repair, chronic HFpEF and RV failure, CAD with prior CAB, CKD 3, permanent afib, chronic respiratory failure/COPD on 4L [place], admitted with SOB. Started on bipap in ER    BNP 1203 WBC 6.8 Hgb 9.8 Plt 199 Lactic acid 1.1 K 3.5 BUN 23 Cr 2.02 Trop 19 -->18 VBG 7.23/78 EKG afib 65 CXR: +pulm edema, small to moderate right pleural effusion   04/28/2024: LVE 60-65%, no WMAs, D shaped septum, RV poorly visualized, normal AVR, mild MR, mod to severe TR   1.Acute on chronic HFpEF/RV failure - 04/28/2024: LVE 60-65%, no WMAs, D shaped septum, RV poorly visualized, normal AVR - CXR pulm edema, BNP 1203   Received IV lasix  40mg  and metolazone  5mg   yesterday, scheduled for 80mg  bid today. Early incomplete I/Os, slight uptrend in Cr to 2.18. Would dose IV lasix  80mg  just once today and reassess tomorrow for ongoing diuretic dosing. Severe TR, JVD may be misleading regarding volume status.   2.Acute on chronic respiratory failure/COPD with hypoxia/hypercapnea - chronic respiratory failure on 2L at home - admission VBG 7.23/78. Started on bipap - abx, steroids, nebs per primary team.   3. History of mechanical  AVR - normal function by 04/2024 echo - on coumadin , INR supratherapeutic. Coumadin  per pharmacy   4. Questionable LA mass - noted on prior echos - could consider TEE or cardiac CT  at some point as outpatient. WIth recurrent admissions for advanced lung disease and recurrent heart failure even if true mass not a candidate for any form of surgical intervention.  - can decide over time if additional imaging is warranted as outpatient.   5. Permanent afib - continue diltiazem , can d/c toprol . Room to titrate dilt if needed - on coumadin  in setting of mechanical AV  Dorn Ross MD

## 2024-05-06 NOTE — TOC Initial Note (Signed)
 Transition of Care [facility]) - Initial/Assessment Note    Patient Details  Name: [name] MRN: [id number] Date of Birth: 02-18-1951  Transition of [facility]) CM/SW Contact:    Sharlyne Stabs, RN Phone Number: 05/06/2024, 1:52 PM  Clinical Narrative:      Patient admitted with acute on chronic respirator failure. PT is recommending SNF. CM at the bedside, patient wanted CM call her son. CM spoke with her son, Alm.    He is agreeable to SNF, She was at [facility] recently and wishes for her to return there. FL2 completed. CM sent a message to [facility]. They will offer. TOC starting INS AUTH. DC planning for Monday.        Expected Discharge Plan: Skilled Nursing Facility Barriers to Discharge: Continued Medical Work up, SNF Pending bed offer   Patient Goals and CMS Choice Patient states their goals for this hospitalization and ongoing recovery are:: agreeable to SNF CMS Medicare.gov Compare Post Acute Care list provided to:: Patient Represenative (must comment) Choice offered to / list presented to : Adult Children [place] ownership interest in [employer].provided to:: Adult Children    Expected Discharge Plan and Services       Living arrangements for the past 2 months: Single Family Home                       Prior Living Arrangements/Services Living arrangements for the past 2 months: Single Family Home Lives with:: Adult Children Patient language and need for interpreter reviewed:: Yes Do you feel safe going back to the place where you live?: Yes      Need for Family Participation in Patient Care: Yes (Comment) Care giver support system in place?: Yes (comment) Current home services: DME Criminal Activity/Legal Involvement Pertinent to Current Situation/Hospitalization: No - Comment as needed  Activities of Daily Living      Permission Sought/Granted     Emotional Assessment     Affect (typically observed): Accepting Orientation: : Oriented to Self,  Oriented to Place, Oriented to Situation Alcohol / Substance Use: Not Applicable Psych Involvement: No (comment)  Admission diagnosis:  AKI (acute kidney injury) (HCC) [N17.9] Acute on chronic respiratory failure (HCC) [J96.20] Acute respiratory failure with hypoxia and hypercapnia (HCC) [J96.01, J96.02] Acute congestive heart failure, unspecified heart failure type (HCC) [I50.9] Patient Active Problem List   Diagnosis Date Noted   Acute on chronic respiratory failure (HCC) 05/05/2024   Acute on chronic respiratory failure with hypoxia (HCC) 09/09/2023   Rib fractures 06/11/2023   Right tibial fracture 06/11/2023   Acute diastolic CHF (congestive heart failure) (HCC) 05/29/2019   Itching 05/29/2019   Acute hypokalemia 05/05/2019   Acute CHF (congestive heart failure) (HCC) 04/08/2019   Acute CHF (HCC) 04/07/2019   Asthma, chronic, unspecified asthma severity, with acute exacerbation 04/07/2019   Benign essential HTN    CHF (congestive heart failure) (HCC) 07/26/2018   Transaminitis 07/23/2018   Acute on chronic respiratory failure with hypoxia and hypercapnia (HCC) 06/19/2018   Acute respiratory failure with hypoxia (HCC)    Supratherapeutic INR 05/03/2018   CHF exacerbation (HCC) 05/02/2018   Acute metabolic encephalopathy 05/02/2018   CKD (chronic kidney disease) stage 3, GFR 30-59 ml/min (HCC) 05/02/2018   Hypothyroidism 05/02/2018   Generalized weakness 05/02/2018   Physical deconditioning    Coronary artery disease of bypass graft of native heart with stable angina pectoris (HCC)    Renal failure (ARF), acute on chronic (HCC)  Acute on chronic respiratory failure with hypercapnia (HCC) 03/24/2018   Hypomagnesemia 03/24/2018   Hypokalemia 03/24/2018   Hypophosphatemia 03/24/2018   Atrial fibrillation with RVR (HCC) 03/24/2018   Elevated troponin 03/24/2018   Coughing 01/14/2018   Anemia 01/14/2018   Hx of adenomatous colonic polyps 01/14/2018   Acute on chronic  diastolic congestive heart failure (HCC) 12/17/2016   COPD with acute exacerbation (HCC) 12/17/2016   Bilateral leg edema 07/05/2014   Encounter for therapeutic drug monitoring 11/15/2013   Chronic venous insufficiency 08/26/2011   Chronic anticoagulation 01/11/2011   Peptic stricture of esophagus 10/04/2010   GERD 09/16/2010   DYSPHAGIA 09/16/2010   H/O heart valve replacement with mechanical valve 06/05/2009   Hyperlipidemia 06/04/2009   Coronary atherosclerosis of native coronary artery 06/04/2009   Atrial fibrillation, chronic (HCC) 06/04/2009   PCP:  Renato Dorothey HERO, NP Pharmacy:   [facility] [place] [place] - [address] [place] [address] Phone: [phone] Fax: [phone]     Social Drivers of Health (SDOH) Social History: SDOH Screenings   Food Insecurity: No Food Insecurity (05/06/2024)  Housing: Low Risk  (05/06/2024)  Transportation Needs: No Transportation Needs (05/06/2024)  Utilities: Not At Risk (05/06/2024)  Financial Resource Strain: Low Risk  (12/27/2023)   [facility] from [facility]  Physical Activity: Inactive (12/27/2023)   Received from [facility]  Social Connections: Socially Isolated (05/06/2024)  Stress: Stress Concern Present (12/27/2023)   Received from [facility]  Tobacco Use: Medium Risk (05/05/2024)  Health Literacy: Medium Risk (01/08/2024)   Received from [facility]   SDOH Interventions:     Readmission Risk Interventions    09/10/2023    2:02 PM  Readmission Risk Prevention Plan  Transportation Screening Complete  HRI or Home Care Consult Complete  Social Work Consult for Recovery Care Planning/Counseling Complete  Palliative Care Screening Not Applicable  Medication Review Oceanographer) Complete

## 2024-05-06 NOTE — NC FL2 (Signed)
 [place]  MEDICAID FL2 LEVEL OF CARE FORM     IDENTIFICATION  Patient Name: [name] Birthdate: [birth year] Sex: female Admission Date (Current Location): 05/05/2024  [facility] and [place] Number:  [employer] and Address:  [facility],  [address] [place] [address]      Provider Number: [id number]  Attending Physician Name and Address:  Ricky Fines, MD  Relative Name and Phone Number:  VINCENZO ALM Rhody)  [phone]    Current Level of Care: Hospital Recommended Level of Care: Skilled Nursing Facility Prior Approval Number:    Date Approved/Denied:   PASRR Number: [id number]  Discharge Plan: SNF    Current Diagnoses: Patient Active Problem List   Diagnosis Date Noted   Acute on chronic respiratory failure (HCC) 05/05/2024   Acute on chronic respiratory failure with hypoxia (HCC) 09/09/2023   Rib fractures 06/11/2023   Right tibial fracture 06/11/2023   Acute diastolic CHF (congestive heart failure) (HCC) 05/29/2019   Itching 05/29/2019   Acute hypokalemia 05/05/2019   Acute CHF (congestive heart failure) (HCC) 04/08/2019   Acute CHF (HCC) 04/07/2019   Asthma, chronic, unspecified asthma severity, with acute exacerbation 04/07/2019   Benign essential HTN    CHF (congestive heart failure) (HCC) 07/26/2018   Transaminitis 07/23/2018   Acute on chronic respiratory failure with hypoxia and hypercapnia (HCC) 06/19/2018   Acute respiratory failure with hypoxia (HCC)    Supratherapeutic INR 05/03/2018   CHF exacerbation (HCC) 05/02/2018   Acute metabolic encephalopathy 05/02/2018   CKD (chronic kidney disease) stage 3, GFR 30-59 ml/min (HCC) 05/02/2018   Hypothyroidism 05/02/2018   Generalized weakness 05/02/2018   Physical deconditioning    Coronary artery disease of bypass graft of native heart with stable angina pectoris (HCC)    Renal failure (ARF), acute on chronic (HCC)    Acute on chronic respiratory failure with  hypercapnia (HCC) 03/24/2018   Hypomagnesemia 03/24/2018   Hypokalemia 03/24/2018   Hypophosphatemia 03/24/2018   Atrial fibrillation with RVR (HCC) 03/24/2018   Elevated troponin 03/24/2018   Coughing 01/14/2018   Anemia 01/14/2018   Hx of adenomatous colonic polyps 01/14/2018   Acute on chronic diastolic congestive heart failure (HCC) 12/17/2016   COPD with acute exacerbation (HCC) 12/17/2016   Bilateral leg edema 07/05/2014   Encounter for therapeutic drug monitoring 11/15/2013   Chronic venous insufficiency 08/26/2011   Chronic anticoagulation 01/11/2011   Peptic stricture of esophagus 10/04/2010   GERD 09/16/2010   DYSPHAGIA 09/16/2010   H/O heart valve replacement with mechanical valve 06/05/2009   Hyperlipidemia 06/04/2009   Coronary atherosclerosis of native coronary artery 06/04/2009   Atrial fibrillation, chronic (HCC) 06/04/2009    Orientation RESPIRATION BLADDER Height & Weight     Self, Situation, Place  O2 (See DC Summary- Currently 4L) Continent Weight: 68.1 kg Height:  5' 8 (172.7 cm)  BEHAVIORAL SYMPTOMS/MOOD NEUROLOGICAL BOWEL NUTRITION STATUS      Continent Diet (See DC summary)  AMBULATORY STATUS COMMUNICATION OF NEEDS Skin   Extensive Assist Verbally Bruising                       Personal Care Assistance Level of Assistance  Bathing, Feeding, Dressing Bathing Assistance: Maximum assistance Feeding assistance: Limited assistance Dressing Assistance: Maximum assistance     Functional Limitations Info  Hearing, Speech, Sight Sight Info: Impaired Hearing Info: Adequate Speech Info: Adequate    SPECIAL CARE FACTORS FREQUENCY  PT (By licensed PT)     PT Frequency:  5 times a week              Contractures Contractures Info: Not present    Additional Factors Info  Code Status, Allergies Code Status Info: DNR- Limited Allergies Info: Penicillin, Dust Mite extract           Current Medications (05/06/2024):  This is the current  hospital active medication list Current Facility-Administered Medications  Medication Dose Route Frequency Provider Last Rate Last Admin   0.9 %  sodium chloride  infusion  250 mL Intravenous Continuous Elgergawy, Brayton RAMAN, MD   Held at 05/05/24 2021   acetaminophen  (TYLENOL ) tablet 650 mg  650 mg Oral Q6H PRN Elgergawy, Dawood S, MD       Or   acetaminophen  (TYLENOL ) suppository 650 mg  650 mg Rectal Q6H PRN Elgergawy, Dawood S, MD       albuterol  (PROVENTIL ) (2.5 MG/3ML) 0.083% nebulizer solution 2.5 mg  2.5 mg Nebulization Q2H PRN Elgergawy, Dawood S, MD       allopurinol  (ZYLOPRIM ) tablet 100 mg  100 mg Oral Daily Elgergawy, Dawood S, MD   100 mg at 05/06/24 1016   ALPRAZolam  (XANAX ) tablet 0.5 mg  0.5 mg Oral BID Elgergawy, Dawood S, MD   0.5 mg at 05/06/24 1016   bisacodyl  (DULCOLAX) EC tablet 5 mg  5 mg Oral Daily PRN Elgergawy, Dawood S, MD       cefTRIAXone  (ROCEPHIN ) 1 g in sodium chloride  0.9 % 100 mL IVPB  1 g Intravenous Q24H Elgergawy, Dawood S, MD   Stopped at 05/06/24 0015   Chlorhexidine  Gluconate Cloth 2 % PADS 6 each  6 each Topical QHS Elgergawy, Dawood S, MD   6 each at 05/05/24 2245   diltiazem  (CARDIZEM  CD) 24 hr capsule 120 mg  120 mg Oral Daily Elgergawy, Dawood S, MD   120 mg at 05/06/24 1016   hydrALAZINE  (APRESOLINE ) injection 5 mg  5 mg Intravenous Q4H PRN Elgergawy, Dawood S, MD       hydrocerin (EUCERIN) cream   Topical BID Ricky Fines, MD   Given at 05/06/24 1332   ipratropium-albuterol  (DUONEB) 0.5-2.5 (3) MG/3ML nebulizer solution 3 mL  3 mL Nebulization Q4H Elgergawy, Dawood S, MD   3 mL at 05/06/24 1255   levothyroxine  (SYNTHROID ) tablet 50 mcg  50 mcg Oral QAC breakfast Elgergawy, Dawood S, MD   50 mcg at 05/06/24 9170   magnesium  hydroxide (MILK OF MAGNESIA) suspension 30 mL  30 mL Oral Daily PRN Elgergawy, Dawood S, MD       methylPREDNISolone  sodium succinate (SOLU-MEDROL ) 125 mg/2 mL injection 125 mg  125 mg Intravenous Daily Elgergawy, Dawood S, MD   125  mg at 05/06/24 1018   multivitamin with minerals tablet 1 tablet  1 tablet Oral Daily Elgergawy, Dawood S, MD   1 tablet at 05/06/24 1016   norepinephrine  (LEVOPHED ) 4mg  in (0.016 mg/mL) premix infusion  0-10 mcg/min Intravenous Titrated Elgergawy, Brayton RAMAN, MD   Held at 05/05/24 2021   ondansetron  (ZOFRAN ) tablet 4 mg  4 mg Oral Q6H PRN Elgergawy, Dawood S, MD       Or   ondansetron  (ZOFRAN ) injection 4 mg  4 mg Intravenous Q6H PRN Elgergawy, Dawood S, MD       pantoprazole  (PROTONIX ) injection 40 mg  40 mg Intravenous Q24H Elgergawy, Dawood S, MD   40 mg at 05/05/24 2337   rOPINIRole  (REQUIP ) tablet 3 mg  3 mg Oral BID Elgergawy, Dawood S, MD  3 mg at 05/06/24 1016   rosuvastatin  (CRESTOR ) tablet 20 mg  20 mg Oral Daily Elgergawy, Dawood S, MD   20 mg at 05/05/24 2109     Discharge Medications: Please see discharge summary for a list of discharge medications.  Relevant Imaging Results:  Relevant Lab Results:   Additional Information SS# [id number]  Sharlyne Stabs, RN

## 2024-05-06 NOTE — Progress Notes (Addendum)
   05/06/24 1300  ReDS Vest / Clip  Station Marker C  Ruler Value 35  ReDS Value Range (!) > 40  ReDS Actual Value 3    Dr Alvan and Dr Ricky made aware of results.

## 2024-05-06 NOTE — Progress Notes (Signed)
 1650 Patient eating dinner unavailable for nebulizer treatment at this time.

## 2024-05-06 NOTE — Progress Notes (Signed)
 PHARMACY - ANTICOAGULATION CONSULT NOTE  Pharmacy Consult for Coumadin  Indication: atrial fibrillation  Allergies  Allergen Reactions   Penicillin G Itching   Penicillins Hives, Itching, Swelling and Rash   Dust Mite Extract Other (See Comments)    Seasonal allergies    Patient Measurements: Height: 5' 8 (172.7 cm) Weight: 68.1 kg (150 lb 2.1 oz) IBW/kg (Calculated) : 63.9 HEPARIN  DW (KG): 66.9  Vital Signs: Temp: 97.6 F (36.4 C) (07/18 0802) Temp Source: Axillary (07/18 0802) BP: 101/59 (07/18 0900) Pulse Rate: 74 (07/18 0900)  Labs: Recent Labs    05/05/24 1700 05/05/24 1918 05/06/24 0424  HGB 9.8*  --  9.2*  HCT 32.5*  --  31.9*  PLT 199  --  156  LABPROT  --  44.1* 45.4*  INR  --  4.4* 4.6*  CREATININE 2.02*  --  2.18*  TROPONINIHS 19* 18*  --     Estimated Creatinine Clearance: 23.5 mL/min (A) (by C-G formula based on SCr of 2.18 mg/dL (H)).   Medical History: Past Medical History:  Diagnosis Date   Anxiety disorder    Aortic stenosis    Status post [employer] mechanical AVR 2007   Asthma    Atrial fibrillation (HCC)    Carcinoid tumor of colon 2007   Chronic diastolic heart failure (HCC)    Coronary atherosclerosis of native coronary artery    Status post CABG 2007   GERD (gastroesophageal reflux disease)    History of colonoscopy 2003   Dr. 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 Shaaron, benign biopsies   RLS (restless legs syndrome)    Schatzki's ring     Medications:  Medications Prior to Admission  Medication Sig Dispense Refill Last Dose/Taking   albuterol  (VENTOLIN  HFA) 108 (90 Base) MCG/ACT inhaler Inhale 1-2 puffs into the lungs daily as needed for wheezing or shortness of breath.   Unknown   allopurinol  (ZYLOPRIM ) 100 MG tablet Take 100 mg by mouth daily.   05/05/2024 Noon   ALPRAZolam  (XANAX ) 0.5 MG tablet Take 1 tablet (0.5 mg total) by mouth 2  (two) times daily. 10 tablet 0 05/04/2024 Bedtime   CAPLYTA  42 MG capsule Take 42 mg by mouth at bedtime.   05/03/2024   diltiazem  (CARDIZEM  CD) 120 MG 24 hr capsule Take 1 capsule (120 mg total) by mouth daily. 30 capsule 2 05/05/2024 Noon   FARXIGA  10 MG TABS tablet Take 10 mg by mouth daily.   05/05/2024 Noon   ipratropium (ATROVENT ) 0.03 % nasal spray Place 2 sprays into both nostrils 2 (two) times daily as needed for rhinitis.   05/05/2024 Noon   levothyroxine  (SYNTHROID , LEVOTHROID) 50 MCG tablet Take 50 mcg by mouth daily before breakfast.    05/05/2024 Morning   metoprolol  succinate (TOPROL -XL) 50 MG 24 hr tablet Take 50 mg by mouth daily.   05/05/2024 Noon   Multiple Vitamin (MULTIVITAMIN WITH MINERALS) TABS tablet Take 1 tablet by mouth daily.   05/04/2024 Morning   omeprazole  (PRILOSEC) 20 MG capsule Take 20 mg by mouth daily.   05/05/2024 Morning   OXYGEN  Inhale 4 L into the lungs daily.   05/05/2024 Morning   rOPINIRole  (REQUIP ) 3 MG tablet Take 1 tablet (3 mg total) by mouth 2 (two) times daily. 10 tablet 0 05/05/2024 Noon   rosuvastatin  (CRESTOR ) 20 MG tablet TAKE ONE (1) TABLET BY MOUTH EVERY DAY  90 tablet 3 05/05/2024 Noon   torsemide  (DEMADEX ) 100 MG tablet Take 100 mg by mouth daily.   05/05/2024 Noon   warfarin (COUMADIN ) 2 MG tablet Take 1.5-2 tablets (3-4 mg total) by mouth See admin instructions. 4mg  on Tuesday and 3 mg daily the rest of the week. (Patient taking differently: Take 3-4 mg by mouth See admin instructions. Take 3 mg by mouth every day)   05/05/2024 at 11:00 AM    Assessment: 73 yo female history of aortic stenosis s/p mechanical AVR and ascending aneurysm repair, chronic HFpEF and RV failure, CAD with prior CAB, CKD 3, permanent afib, chronic respiratory failure/COPD on 4L [place], admitted with SOB. Pharmacy asked to dose coumadin . INR 4.4> 4.6, will continue to Hold coumadin . Hgb 9.8> 9.2, track and trend. Diuresing patient .  Home dose recently changed 7/14 d/t elevated INR  3.3 to 3mg  daily.  Goal of Therapy:  INR 2-3 Monitor platelets by anticoagulation protocol: Yes   Plan:  No coumadin  today Daily PT-INR Monitor for S/S of bleeding  Cherlyn Boers, BS Pharm D, BCPS Clinical Pharmacist 05/06/2024,10:01 AM

## 2024-05-06 NOTE — Progress Notes (Signed)
 Progress Note   Patient: [name]:[id number] DOB: 06-Jun-1951 DOA: 05/05/2024     1 DOS: the patient was seen and examined on 05/06/2024   Brief hospital admission narrative: As per H&P written by Dr. Sherlon on 05/05/2024 [name]  is a 73 y.o. female,  with longstanding history of COPD and chronic respiratory failure typically on 4 L/min supplemental oxygen , chronic atrial fibrillation, HTN, HLD, CAD status post 2V CABG with mechanical aortic valve replacement, patient on home oxygen , not on BiPAP or CPAP at home, patient most recent hospitalization was at [facility] for respiratory failure secondary to CHF and COPD. - She presents today secondary to complaints of dyspnea, continuous and deconditioning, son reports she was unable to get out of bed secondary to difficulty breathing, weakness, has been compliant with her home medication including torsemide , no cough, no fever, no chest pain, no vomiting. - In ED she was with significant increased work of breathing, respiratory distress, ABG was obtained, showing significant respiratory acidosis with PCO2 of 78, labs significant for worsening creatinine at 2, BNP significantly elevated at 1203, chest x-ray with evidence of volume overload, lactic acid within normal limit, normal white blood cell count, INR elevated at 4.4, patient was started on BiPAP with improvement of her symptoms, [facility] hospitalist consulted to admit patient for further evaluation and management.  Assessment and plan 1-acute on chronic respiratory failure with hypoxia and hypercapnia - Appears to be secondary to COPD exacerbation along with acute on chronic diastolic heart failure - Excellent response to the use of BiPAP; for now we will use just nightly-continue daily weights, low-sodium diet and strict I's and O's - Close monitoring to patient's renal function and electrolytes - Continue IV diuresis - Will continue treatment with antibiotics, steroids,  bronchodilator management and mucolytic's. - Follow clinical response.  2-acute on chronic kidney injury - Most likely in the setting of cardiorenal syndrome - Continue IV diuresis and follow renal function trend - Minimize nephrotoxic agents and avoid the use of contrast and hypotension.  3-acute on chronic diastolic heart failure and COPD exacerbation - Treatment as mentioned above - Follow clinical response - Cardiology service consulted and will follow recommendations.  4-atrial fibrillation with mechanical aortic valve replacement - INR supratherapeutic; pharmacy on board and assisting with dose management - No overt bleeding appreciated - Continue treatment with Cardizem  for rate control - Continue telemetry monitoring - Goal is for potassium above 4 magnesium  above 2 as much as possible.  5-hypothyroidism - Continue Synthroid .  6-hypertension/hyperlipidemia - Continue current antihypertensive agents in the setting of Cardizem  and diuresis - Continue statin.  7-coronary artery disease - Patient reports no chest pain - Continue current medication regimen and outpatient follow-up with cardiology service.  8-anemia of chronic disease - Hemoglobin is stable and at baseline - No overt bleeding appreciated - Continue to follow trend/stability.  Subjective:  Off BiPAP for the moment; following commands appropriately and in no acute distress.  Still with signs of fluid overload and Reds Clip Of 43.  Physical Exam: Vitals:   05/06/24 1128 05/06/24 1255 05/06/24 1500 05/06/24 1542  BP:   94/62   Pulse:   78   Resp:   (!) 25   Temp: 98.2 F (36.8 C)   97.8 F (36.6 C)  TempSrc: Oral   Axillary  SpO2:  91%    Weight:      Height:       General exam: Alert, awake, following commands appropriately and in no  acute distress; no chest pain. Respiratory system: Decreased breath sounds at the bases; mild expiratory wheezing and positive tachypnea with  exertion. Cardiovascular system: Rate controlled, no rubs, no gallops, positive murmur.  No JVD appreciated on exam. Gastrointestinal system: Abdomen is nondistended, soft and nontender. No organomegaly or masses felt. Normal bowel sounds heard. Central nervous system: Moving 4 limbs spontaneously.  No focal neurological deficits. Extremities: No cyanosis or clubbing.  Trace to 1+ edema appreciated bilaterally. Skin: No petechiae. Psychiatry: Flat affect appreciated on exam.   Data Reviewed: Basic metabolic panel: Sodium 140, potassium 3.5, chloride 99, bicarb 28, BUN 27, creatinine 2.1 and GFR 23.  Anion gap 13 CBC: White blood cell 5.6, hemoglobin 9.2, platelet count 1 56K  Family Communication: Son updated at bedside.  Disposition: Status is: Inpatient Remains inpatient appropriate because: IV diuresis.  Anticipating discharge back home with home health services when medically stable.  Time spent: 50 minutes  Author: Eric Nunnery, MD 05/06/2024 4:01 PM  For on call review [email or web address].

## 2024-05-06 NOTE — Progress Notes (Signed)
 1223 Patient eating lunch unavailable for nebulizer treatment at this time.

## 2024-05-06 NOTE — Consult Note (Addendum)
 WOC Nurse Consult Note:  Reason for Consult: nipple wound, BLE scabs  Wound type: 1.  Full thickness vs ? to nipple unknown etiology  2.  Full thickness B lower extremities likely r/t venous insufficiency  Pressure Injury POA: NA  Measurement: see nursing flowsheet  Wound bed: nipple red moist with circumferential purple discoloration; B lower legs with scattered dark dry scabs  Drainage (amount, consistency, odor) see nursing flowsheet  Periwound:  dark discoloration consistent with venous stasis changes to B lower legs  Dressing procedure/placement/frequency:  Cleanse legs with soap and water , apply Eucerin cream 2 times a day.  If open draining areas develop may apply Xeroform gauze (Lawson [id number]) and secure with Kerlix roll gauze.  Cleanse nipple with NS, apply Vaseline gauze (Lawson [phone]) to area daily and cover with ABD pad or silicone foam whichever secures area better.  If using an ABD pad can try tape or mesh underwear with crotch cut out to make a tube top and attempt to hold dressing.    POC discussed with bedside nurse.  Uncertain etiology of nipple wound?  If this area does not improve would recommend evaluation by PCP/dermatology as outpatient.    WOC team will not follow.  Re-consult if further needs arise.   Thank you    Powell Bar MSN, RN-BC, CWOCN

## 2024-05-06 NOTE — Evaluation (Signed)
 Occupational Therapy Evaluation Patient Details Name: [name] MRN: [id number] DOB: 05/15/1951 Today's Date: 05/06/2024   History of Present Illness   [name]  is a 73 y.o. female,  with longstanding history of COPD and chronic respiratory failure typically on 4 L/min supplemental oxygen , chronic atrial fibrillation, HTN, HLD, CAD status post 2V CABG with mechanical aortic valve replacement, patient on home oxygen , not on BiPAP or CPAP at home, patient most recent hospitalization was at [facility] for respiratory failure secondary to CHF and COPD.  - She presents today secondary to complaints of dyspnea, continuous and deconditioning, son reports she was unable to get out of bed secondary to difficulty breathing, weakness, has been compliant with her home medication including torsemide , no cough, no fever, no chest pain, no vomiting.  - In ED she was with significant increased work of breathing, respiratory distress, ABG was obtained, showing significant respiratory acidosis with PCO2 of 78, labs significant for worsening creatinine at 2, BNP significantly elevated at 1203, chest x-ray with evidence of volume overload, lactic acid within normal limit, normal white blood cell count, INR elevated at 4.4, patient was started on BiPAP with improvement of her symptoms, [facility] hospitalist consulted to admit (per MD)     Clinical Impressions Pt agreeable to OT and PT co-evaluation. Pt lives with her son is available PRN. Today pt required min A for bed mobility and min to mod A for transfer/ambulation within the room. Set up assist for seated ADL's at this time. B UE generally weak with mild A/ROM limitations at the shoulder. Pt left in the chair with call bell within reach. Pt will benefit from continued OT in the hospital and recommended venue below to increase strength, balance, and endurance for safe ADL's.        If plan is discharge home, recommend the following:   A lot of help  with walking and/or transfers;A little help with bathing/dressing/bathroom;Assistance with cooking/housework;Assist for transportation;Help with stairs or ramp for entrance     Functional Status Assessment   Patient has had a recent decline in their functional status and demonstrates the ability to make significant improvements in function in a reasonable and predictable amount of time.     Equipment Recommendations   None recommended by OT             Precautions/Restrictions   Precautions Precautions: Fall Recall of Precautions/Restrictions: Intact Restrictions Weight Bearing Restrictions Per Provider Order: No     Mobility Bed Mobility Overal bed mobility: Needs Assistance Bed Mobility: Supine to Sit     Supine to sit: Min assist, HOB elevated     General bed mobility comments: Single hand held assist to pull to sit.    Transfers Overall transfer level: Needs assistance Equipment used: Rolling walker (2 wheels) Transfers: Sit to/from Stand, Bed to chair/wheelchair/BSC Sit to Stand: Mod assist     Step pivot transfers: Min assist, Mod assist     General transfer comment: EOB to chair transfer with RW. Labored movement; unsteady.      Balance Overall balance assessment: Needs assistance Sitting-balance support: Feet supported, No upper extremity supported Sitting balance-Leahy Scale: Good Sitting balance - Comments: fair to good seated at EOB   Standing balance support: Bilateral upper extremity supported, During functional activity, Reliant on assistive device for balance Standing balance-Leahy Scale: Poor Standing balance comment: poor to fair with RW  ADL either performed or assessed with clinical judgement   ADL Overall ADL's : Needs assistance/impaired     Grooming: Set up;Sitting   Upper Body Bathing: Set up;Sitting   Lower Body Bathing: Set up;Sitting/lateral leans   Upper Body Dressing : Set  up;Sitting   Lower Body Dressing: Set up;Sitting/lateral leans   Toilet Transfer: Moderate assistance;Minimal assistance;Stand-pivot;Rolling walker (2 wheels) Toilet Transfer Details (indicate cue type and reason): Simulated via EOB to chair transfer. Toileting- Clothing Manipulation and Hygiene: Contact guard assist;Sitting/lateral lean       Functional mobility during ADLs: Minimal assistance;Rolling walker (2 wheels) General ADL Comments: Able to ambulate a few steps in the room with the RW.     Vision Baseline Vision/History: 1 Wears glasses Ability to See in Adequate Light: 0 Adequate Patient Visual Report: No change from baseline Vision Assessment?: No apparent visual deficits     Perception Perception: Not tested       Praxis Praxis: Not tested       Pertinent Vitals/Pain Pain Assessment Pain Assessment: No/denies pain     Extremity/Trunk Assessment Upper Extremity Assessment Upper Extremity Assessment: Generalized weakness (3-/5 shoulder flexion bilaterally)   Lower Extremity Assessment Lower Extremity Assessment: Defer to PT evaluation   Cervical / Trunk Assessment Cervical / Trunk Assessment: Normal   Communication Communication Communication: Impaired Factors Affecting Communication: Hearing impaired   Cognition Arousal: Alert Behavior During Therapy: WFL for tasks assessed/performed Cognition: No apparent impairments                               Following commands: Intact       Cueing  General Comments   Cueing Techniques: Verbal cues                 Home Living Family/patient expects to be discharged to:: Private residence Living Arrangements: Children Available Help at Discharge: Family Type of Home: Mobile home Home Access: Stairs to enter Secretary/administrator of Steps: 2 Entrance Stairs-Rails: Can reach both Home Layout: One level     Bathroom Shower/Tub: Chief Strategy Officer: Handicapped  height Bathroom Accessibility: No   Home Equipment: Agricultural consultant (2 wheels);BSC/3in1;Wheelchair - manual   Additional Comments: Son only available PRN.      Prior Functioning/Environment Prior Level of Function : Independent/Modified Independent             Mobility Comments: Household ambulator using SPC. ADLs Comments: Pt reports independence with ADL's. Pt reports she can cook and clean. Does not drive.    OT Problem List: Decreased strength;Decreased range of motion;Decreased activity tolerance;Impaired balance (sitting and/or standing)   OT Treatment/Interventions: Self-care/ADL training;Therapeutic exercise;Therapeutic activities;Patient/family education;Balance training      OT Goals(Current goals can be found in the care plan section)   Acute Rehab OT Goals Patient Stated Goal: return home OT Goal Formulation: With patient Time For Goal Achievement: 05/20/24 Potential to Achieve Goals: Fair   OT Frequency:  Min 2X/week    Co-evaluation PT/OT/SLP Co-Evaluation/Treatment: Yes Reason for Co-Treatment: To address functional/ADL transfers   OT goals addressed during session: ADL's and self-care                       End of Session Equipment Utilized During Treatment: Rolling walker (2 wheels);Oxygen   Activity Tolerance: Patient tolerated treatment well Patient left: in chair;with call bell/phone within reach  OT Visit Diagnosis: Unsteadiness on feet (R26.81);Other abnormalities of gait  and mobility (R26.89);Muscle weakness (generalized) (M62.81)                Time: 9091-9073 OT Time Calculation (min): 18 min Charges:  OT General Charges $OT Visit: 1 Visit OT Evaluation $OT Eval Low Complexity: 1 Low  Nashla Althoff OT, MOT  Jayson Person 05/06/2024, 11:40 AM

## 2024-05-06 NOTE — Plan of Care (Signed)
  Problem: Acute Rehab PT Goals(only PT should resolve) Goal: Pt Will Go Supine/Side To Sit Outcome: Progressing Flowsheets (Taken 05/06/2024 1333) Pt will go Supine/Side to Sit:  with minimal assist  with HOB elevated  with modified independence Goal: Patient Will Transfer Sit To/From Stand Outcome: Progressing Flowsheets (Taken 05/06/2024 1333) Patient will transfer sit to/from stand:  with minimal assist  with contact guard assist Goal: Pt Will Transfer Bed To Chair/Chair To Bed Outcome: Progressing Flowsheets (Taken 05/06/2024 1333) Pt will Transfer Bed to Chair/Chair to Bed: with min assist Goal: Pt Will Ambulate Outcome: Progressing Flowsheets (Taken 05/06/2024 1333) Pt will Ambulate:  with minimal assist  with rolling walker  25 feet  Arrionna Serena, SPT

## 2024-05-07 DIAGNOSIS — J9621 Acute and chronic respiratory failure with hypoxia: Secondary | ICD-10-CM | POA: Diagnosis not present

## 2024-05-07 DIAGNOSIS — J441 Chronic obstructive pulmonary disease with (acute) exacerbation: Secondary | ICD-10-CM | POA: Diagnosis not present

## 2024-05-07 DIAGNOSIS — I509 Heart failure, unspecified: Secondary | ICD-10-CM | POA: Diagnosis not present

## 2024-05-07 DIAGNOSIS — I25708 Atherosclerosis of coronary artery bypass graft(s), unspecified, with other forms of angina pectoris: Secondary | ICD-10-CM | POA: Diagnosis not present

## 2024-05-07 LAB — BASIC METABOLIC PANEL WITH GFR
Anion gap: 16 — ABNORMAL HIGH (ref 5–15)
BUN: 40 mg/dL — ABNORMAL HIGH (ref 8–23)
CO2: 31 mmol/L (ref 22–32)
Calcium: 8.5 mg/dL — ABNORMAL LOW (ref 8.9–10.3)
Chloride: 91 mmol/L — ABNORMAL LOW (ref 98–111)
Creatinine, Ser: 2.38 mg/dL — ABNORMAL HIGH (ref 0.44–1.00)
GFR, Estimated: 21 mL/min — ABNORMAL LOW (ref >60)
Glucose, Bld: 187 mg/dL — ABNORMAL HIGH (ref 70–99)
Potassium: 2.7 mmol/L — CL (ref 3.5–5.1)
Sodium: 138 mmol/L (ref 135–145)

## 2024-05-07 LAB — PROTIME-INR
INR: 6.4 (ref 0.8–1.2)
Prothrombin Time: 58.6 s — ABNORMAL HIGH (ref 11.4–15.2)

## 2024-05-07 LAB — POTASSIUM: Potassium: 3.2 mmol/L — ABNORMAL LOW (ref 3.5–5.1)

## 2024-05-07 MED ORDER — SALINE SPRAY 0.65 % NA SOLN: 1.0000 | NASAL | Status: DC | PRN

## 2024-05-07 MED ORDER — FUROSEMIDE 10 MG/ML IJ SOLN
40.0000 mg | Freq: Every day | INTRAMUSCULAR | Status: DC
  Administered 2024-05-07 – 2024-05-09 (×3): 40 mg via INTRAVENOUS
  Filled 2024-05-07 (×3): qty 4

## 2024-05-07 MED ORDER — POTASSIUM CHLORIDE CRYS ER 20 MEQ PO TBCR
40.0000 meq | EXTENDED_RELEASE_TABLET | ORAL | Status: AC
  Administered 2024-05-07 (×2): 40 meq via ORAL
  Filled 2024-05-07 (×2): qty 2

## 2024-05-07 MED ORDER — IPRATROPIUM-ALBUTEROL 0.5-2.5 (3) MG/3ML IN SOLN
3.0000 mL | Freq: Four times a day (QID) | RESPIRATORY_TRACT | Status: DC
  Administered 2024-05-08 – 2024-05-09 (×7): 3 mL via RESPIRATORY_TRACT
  Filled 2024-05-07 (×6): qty 3

## 2024-05-07 NOTE — Progress Notes (Signed)
 PHARMACY - ANTICOAGULATION CONSULT NOTE  Pharmacy Consult for Coumadin  Indication: atrial fibrillation  Allergies  Allergen Reactions   Penicillin G Itching   Penicillins Hives, Itching, Swelling and Rash   Dust Mite Extract Other (See Comments)    Seasonal allergies    Patient Measurements: Height: 5' 8 (172.7 cm) Weight: 65.8 kg (145 lb 1 oz) IBW/kg (Calculated) : 63.9 HEPARIN  DW (KG): 66.9  Vital Signs: Temp: 97.2 F (36.2 C) (07/19 0459) Temp Source: Axillary (07/19 0459) BP: 103/50 (07/19 0700) Pulse Rate: 72 (07/19 0700)  Labs: Recent Labs    05/05/24 1700 05/05/24 1918 05/06/24 0424 05/07/24 0205  HGB 9.8*  --  9.2*  --   HCT 32.5*  --  31.9*  --   PLT 199  --  156  --   LABPROT  --  44.1* 45.4* 58.6*  INR  --  4.4* 4.6* 6.4*  CREATININE 2.02*  --  2.18*  --   TROPONINIHS 19* 18*  --   --     Estimated Creatinine Clearance: 23.5 mL/min (A) (by C-G formula based on SCr of 2.18 mg/dL (H)).   Medical History: Past Medical History:  Diagnosis Date   Anxiety disorder    Aortic stenosis    Status post St. Jude mechanical AVR 2007   Asthma    Atrial fibrillation (HCC)    Carcinoid tumor of colon 2007   Chronic diastolic heart failure (HCC)    Coronary atherosclerosis of native coronary artery    Status post CABG 2007   GERD (gastroesophageal reflux disease)    History of colonoscopy 2003   Dr. 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 Shaaron, benign biopsies   RLS (restless legs syndrome)    Schatzki's ring     Medications:  Medications Prior to Admission  Medication Sig Dispense Refill Last Dose/Taking   albuterol  (VENTOLIN  HFA) 108 (90 Base) MCG/ACT inhaler Inhale 1-2 puffs into the lungs daily as needed for wheezing or shortness of breath.   Unknown   allopurinol  (ZYLOPRIM ) 100 MG tablet Take 100 mg by mouth daily.   05/05/2024 Noon   ALPRAZolam  (XANAX ) 0.5 MG  tablet Take 1 tablet (0.5 mg total) by mouth 2 (two) times daily. 10 tablet 0 05/04/2024 Bedtime   CAPLYTA  42 MG capsule Take 42 mg by mouth at bedtime.   05/03/2024   diltiazem  (CARDIZEM  CD) 120 MG 24 hr capsule Take 1 capsule (120 mg total) by mouth daily. 30 capsule 2 05/05/2024 Noon   FARXIGA  10 MG TABS tablet Take 10 mg by mouth daily.   05/05/2024 Noon   ipratropium (ATROVENT ) 0.03 % nasal spray Place 2 sprays into both nostrils 2 (two) times daily as needed for rhinitis.   05/05/2024 Noon   levothyroxine  (SYNTHROID , LEVOTHROID) 50 MCG tablet Take 50 mcg by mouth daily before breakfast.    05/05/2024 Morning   metoprolol  succinate (TOPROL -XL) 50 MG 24 hr tablet Take 50 mg by mouth daily.   05/05/2024 Noon   Multiple Vitamin (MULTIVITAMIN WITH MINERALS) TABS tablet Take 1 tablet by mouth daily.   05/04/2024 Morning   omeprazole  (PRILOSEC) 20 MG capsule Take 20 mg by mouth daily.   05/05/2024 Morning   OXYGEN  Inhale 4 L into the lungs daily.   05/05/2024 Morning   rOPINIRole  (REQUIP ) 3 MG tablet Take 1 tablet (3 mg total) by mouth 2 (two) times daily. 10  tablet 0 05/05/2024 Noon   rosuvastatin  (CRESTOR ) 20 MG tablet TAKE ONE (1) TABLET BY MOUTH EVERY DAY 90 tablet 3 05/05/2024 Noon   torsemide  (DEMADEX ) 100 MG tablet Take 100 mg by mouth daily.   05/05/2024 Noon   warfarin (COUMADIN ) 2 MG tablet Take 1.5-2 tablets (3-4 mg total) by mouth See admin instructions. 4mg  on Tuesday and 3 mg daily the rest of the week. (Patient taking differently: Take 3-4 mg by mouth See admin instructions. Take 3 mg by mouth every day)   05/05/2024 at 11:00 AM    Assessment: 73 yo female history of aortic stenosis s/p mechanical AVR and ascending aneurysm repair, chronic HFpEF and RV failure, CAD with prior CAB, CKD 3, permanent afib, chronic respiratory failure/COPD on 4L [place], admitted with SOB. Pharmacy asked to dose coumadin . INR 4.4> 4.6> 6.4, will continue to Hold coumadin .No overt bleeding noted. Hgb 9.8> 9.2, track and  trend. Diuresing patient .  Home dose recently changed 7/14 d/t elevated INR 3.3 >>  3mg  daily.  Goal of Therapy:  INR 2-3 Monitor platelets by anticoagulation protocol: Yes   Plan:  No coumadin  today Daily PT-INR Monitor for S/S of bleeding  Cherlyn Boers, BS Pharm D, BCPS Clinical Pharmacist 05/07/2024,7:22 AM

## 2024-05-07 NOTE — Progress Notes (Signed)
 Critical lab  Documentation   05/07/24 0510  Provider Notification  Provider Name/Title Dr. Dorn Dawson  Date Provider Notified 05/07/24  Time Provider Notified [phone]  Method of Notification Page (secure chat)  Notification Reason Critical Result  Test performed and critical result INR 6.4  Date Critical Result Received 05/07/24  Time Critical Result Received 0507  Provider response No new orders  Date of Provider Response 05/07/24  Time of Provider Response [phone]

## 2024-05-07 NOTE — Progress Notes (Signed)
 1640 Patient eating dinner(w/o oxygen  in nose desaturations noted asked her to replace it) patient unavailable for nebulizer at this time.

## 2024-05-07 NOTE — Progress Notes (Signed)
 Progress Note   Patient: [name] FMW:[id number] DOB: 02-Feb-1951 DOA: 05/05/2024     2 DOS: the patient was seen and examined on 05/07/2024   Brief hospital admission narrative: As per H&P written by Dr. Sherlon on 05/05/2024 [name]  is a 73 y.o. female,  with longstanding history of COPD and chronic respiratory failure typically on 4 L/min supplemental oxygen , chronic atrial fibrillation, HTN, HLD, CAD status post 2V CABG with mechanical aortic valve replacement, patient on home oxygen , not on BiPAP or CPAP at home, patient most recent hospitalization was at [facility] for respiratory failure secondary to CHF and COPD. - She presents today secondary to complaints of dyspnea, continuous and deconditioning, son reports she was unable to get out of bed secondary to difficulty breathing, weakness, has been compliant with her home medication including torsemide , no cough, no fever, no chest pain, no vomiting. - In ED she was with significant increased work of breathing, respiratory distress, ABG was obtained, showing significant respiratory acidosis with PCO2 of 78, labs significant for worsening creatinine at 2, BNP significantly elevated at 1203, chest x-ray with evidence of volume overload, lactic acid within normal limit, normal white blood cell count, INR elevated at 4.4, patient was started on BiPAP with improvement of her symptoms, [facility] hospitalist consulted to admit patient for further evaluation and management.  Assessment and plan 1-acute on chronic respiratory failure with hypoxia and hypercapnia - Appears to be secondary to COPD exacerbation along with acute on chronic diastolic heart failure - Excellent response to the use of BiPAP; for now we will use just nightly-continue daily weights, low-sodium diet and strict I's and O's - Close monitoring to patient's renal function and electrolytes - Continue IV diuresis (dose adjusted). - Will continue treatment with antibiotics,  steroids, bronchodilator management and mucolytic's. - Continue to follow clinical response.  2-acute on chronic kidney injury - Most likely in the setting of cardiorenal syndrome - Continue IV diuresis and follow renal function trend - Minimize nephrotoxic agents and avoid the use of contrast and hypotension. - Creatinine currently 2.3; continue to follow renal function trend.  3-acute on chronic diastolic heart failure and COPD exacerbation - Treatment as mentioned above - Follow clinical response - Cardiology service consulted and will follow recommendations.  4-atrial fibrillation with mechanical aortic valve replacement - INR supratherapeutic; pharmacy on board and assisting with dose management - No overt bleeding appreciated - Continue treatment with Cardizem  for rate control - Continue telemetry monitoring - Goal is for potassium above 4 magnesium  above 2 as much as possible.  5-hypothyroidism - Continue Synthroid .  6-hypertension/hyperlipidemia - Continue current antihypertensive agents in the setting of Cardizem  and diuresis - Continue statin.  7-coronary artery disease - Patient reports no chest pain - Continue current medication regimen and outpatient follow-up with cardiology service.  8-anemia of chronic disease - Hemoglobin is stable and at baseline - No overt bleeding appreciated - Continue to follow trend/stability.  9-hypokalemia - In the setting of diuresis - Will provide repletion and follow trend - Continue monitoring telemetry.  Subjective:  Still with decreased breath sounds at the bases, tachypnea with minimal exertion and elevated rates clip measurements.  No chest pain, no nausea, no vomiting.  Physical Exam: Vitals:   05/07/24 0900 05/07/24 0921 05/07/24 1000 05/07/24 1124  BP: 124/62  110/63   Pulse: 94 88 87   Resp: (!) 33 (!) 28 (!) 29   Temp:    98 F (36.7 C)  TempSrc:  Axillary  SpO2: 93% 91% 91%   Weight:      Height:        General exam: Alert, awake, oriented x 3; more interactive with improved mentation as per son report.  No fever, no chest pain, no nausea vomiting. Respiratory system: Decreased breath sounds at the bases and positive rhonchi on exam.  Good saturation appreciated on 4 L supplementation. Cardiovascular system: Rate controlled, no rubs, no gallops, positive JVD. Gastrointestinal system: Abdomen is nondistended, soft and nontender.  Positive bowel sounds. Central nervous system: No focal neurological deficits. Extremities: No cyanosis or clubbing.  Trace edema appreciated bilaterally. Skin: No petechiae. Psychiatry: Flat affect appreciated on exam.   Data Reviewed: INR: 6.4 Basic metabolic panel: Sodium 138, potassium 2.7, chloride 91, bicarb 31, BUN 40, creatinine 2.3, GFR 21 CBC: White blood cell 5.6, hemoglobin 9.21 platelet count 156K  Family Communication: Son updated at bedside.  Disposition: Status is: Inpatient Remains inpatient appropriate because: IV diuresis.  Anticipating discharge back home with home health services when medically stable.  Time spent: 50 minutes  Author: Eric Nunnery, MD 05/07/2024 11:35 AM  For on call review [email or web address].

## 2024-05-08 ENCOUNTER — Encounter (HOSPITAL_COMMUNITY): Payer: Self-pay | Admitting: Internal Medicine

## 2024-05-08 DIAGNOSIS — J9621 Acute and chronic respiratory failure with hypoxia: Secondary | ICD-10-CM | POA: Diagnosis not present

## 2024-05-08 DIAGNOSIS — I509 Heart failure, unspecified: Secondary | ICD-10-CM | POA: Diagnosis not present

## 2024-05-08 DIAGNOSIS — I25708 Atherosclerosis of coronary artery bypass graft(s), unspecified, with other forms of angina pectoris: Secondary | ICD-10-CM | POA: Diagnosis not present

## 2024-05-08 DIAGNOSIS — J441 Chronic obstructive pulmonary disease with (acute) exacerbation: Secondary | ICD-10-CM | POA: Diagnosis not present

## 2024-05-08 LAB — BASIC METABOLIC PANEL WITH GFR
Anion gap: 9 (ref 5–15)
BUN: 44 mg/dL — ABNORMAL HIGH (ref 8–23)
CO2: 33 mmol/L — ABNORMAL HIGH (ref 22–32)
Calcium: 8.5 mg/dL — ABNORMAL LOW (ref 8.9–10.3)
Chloride: 94 mmol/L — ABNORMAL LOW (ref 98–111)
Creatinine, Ser: 2.23 mg/dL — ABNORMAL HIGH (ref 0.44–1.00)
GFR, Estimated: 23 mL/min — ABNORMAL LOW (ref >60)
Glucose, Bld: 135 mg/dL — ABNORMAL HIGH (ref 70–99)
Potassium: 2.8 mmol/L — ABNORMAL LOW (ref 3.5–5.1)
Sodium: 136 mmol/L (ref 135–145)

## 2024-05-08 LAB — PROTIME-INR
INR: 4.9 (ref 0.8–1.2)
Prothrombin Time: 47.6 s — ABNORMAL HIGH (ref 11.4–15.2)

## 2024-05-08 MED ORDER — LORAZEPAM 2 MG/ML IJ SOLN
1.0000 mg | Freq: Once | INTRAMUSCULAR | Status: AC | PRN
  Administered 2024-05-08: 1 mg via INTRAVENOUS
  Filled 2024-05-08: qty 1

## 2024-05-08 MED ORDER — ALBUTEROL SULFATE (2.5 MG/3ML) 0.083% IN NEBU
INHALATION_SOLUTION | RESPIRATORY_TRACT | Status: AC
  Administered 2024-05-08: 2.5 mg
  Filled 2024-05-08: qty 3

## 2024-05-08 MED ORDER — LORAZEPAM 2 MG/ML IJ SOLN
0.5000 mg | Freq: Once | INTRAMUSCULAR | Status: AC | PRN
  Administered 2024-05-08: 0.5 mg via INTRAVENOUS
  Filled 2024-05-08: qty 1

## 2024-05-08 MED ORDER — POTASSIUM CHLORIDE CRYS ER 20 MEQ PO TBCR
40.0000 meq | EXTENDED_RELEASE_TABLET | ORAL | Status: AC
  Administered 2024-05-08 (×3): 40 meq via ORAL
  Filled 2024-05-08 (×3): qty 2

## 2024-05-08 NOTE — Progress Notes (Signed)
 Ativan  worked for a short bit however, pt is now back to pulling Bipap mask off shortly after it was reapplied. She also is pulling on nasal cannula despite redirection and education. RT is aware. Kellogg RN

## 2024-05-08 NOTE — Progress Notes (Signed)
 PHARMACY - ANTICOAGULATION CONSULT NOTE  Pharmacy Consult for Coumadin  Indication: atrial fibrillation  Allergies  Allergen Reactions   Penicillin G Itching   Penicillins Hives, Itching, Swelling and Rash   Dust Mite Extract Other (See Comments)    Seasonal allergies    Patient Measurements: Height: 5' 8 (172.7 cm) Weight: 65.1 kg (143 lb 8.3 oz) IBW/kg (Calculated) : 63.9 HEPARIN  DW (KG): 66.9  Vital Signs: Temp: 97.7 F (36.5 C) (07/20 0320) Temp Source: Axillary (07/20 0320) BP: 98/51 (07/20 0700) Pulse Rate: 89 (07/20 0700)  Labs: Recent Labs    05/05/24 1700 05/05/24 1918 05/05/24 1918 05/06/24 0424 05/07/24 0205 05/07/24 0852 05/08/24 0502  HGB 9.8*  --   --  9.2*  --   --   --   HCT 32.5*  --   --  31.9*  --   --   --   PLT 199  --   --  156  --   --   --   LABPROT  --  44.1*   < > 45.4* 58.6*  --  47.6*  INR  --  4.4*   < > 4.6* 6.4*  --  4.9*  CREATININE 2.02*  --   --  2.18*  --  2.38* 2.23*  TROPONINIHS 19* 18*  --   --   --   --   --    < > = values in this interval not displayed.    Estimated Creatinine Clearance: 23 mL/min (A) (by C-G formula based on SCr of 2.23 mg/dL (H)).   Medical History: Past Medical History:  Diagnosis Date   Anxiety disorder    Aortic stenosis    Status post St. Jude mechanical AVR 2007   Asthma    Atrial fibrillation (HCC)    Carcinoid tumor of colon 2007   Chronic diastolic heart failure (HCC)    Coronary atherosclerosis of native coronary artery    Status post CABG 2007   GERD (gastroesophageal reflux disease)    History of colonoscopy 2003   Dr. 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 Shaaron, benign biopsies   RLS (restless legs syndrome)    Schatzki's ring     Medications:  Medications Prior to Admission  Medication Sig Dispense Refill Last Dose/Taking   albuterol  (VENTOLIN  HFA) 108 (90 Base) MCG/ACT inhaler Inhale  1-2 puffs into the lungs daily as needed for wheezing or shortness of breath.   Unknown   allopurinol  (ZYLOPRIM ) 100 MG tablet Take 100 mg by mouth daily.   05/05/2024 Noon   ALPRAZolam  (XANAX ) 0.5 MG tablet Take 1 tablet (0.5 mg total) by mouth 2 (two) times daily. 10 tablet 0 05/04/2024 Bedtime   CAPLYTA  42 MG capsule Take 42 mg by mouth at bedtime.   05/03/2024   diltiazem  (CARDIZEM  CD) 120 MG 24 hr capsule Take 1 capsule (120 mg total) by mouth daily. 30 capsule 2 05/05/2024 Noon   FARXIGA  10 MG TABS tablet Take 10 mg by mouth daily.   05/05/2024 Noon   ipratropium (ATROVENT ) 0.03 % nasal spray Place 2 sprays into both nostrils 2 (two) times daily as needed for rhinitis.   05/05/2024 Noon   levothyroxine  (SYNTHROID , LEVOTHROID) 50 MCG tablet Take 50 mcg by mouth daily before breakfast.    05/05/2024 Morning   metoprolol  succinate (TOPROL -XL) 50 MG 24 hr tablet Take 50 mg by mouth daily.  05/05/2024 Noon   Multiple Vitamin (MULTIVITAMIN WITH MINERALS) TABS tablet Take 1 tablet by mouth daily.   05/04/2024 Morning   omeprazole  (PRILOSEC) 20 MG capsule Take 20 mg by mouth daily.   05/05/2024 Morning   OXYGEN  Inhale 4 L into the lungs daily.   05/05/2024 Morning   rOPINIRole  (REQUIP ) 3 MG tablet Take 1 tablet (3 mg total) by mouth 2 (two) times daily. 10 tablet 0 05/05/2024 Noon   rosuvastatin  (CRESTOR ) 20 MG tablet TAKE ONE (1) TABLET BY MOUTH EVERY DAY 90 tablet 3 05/05/2024 Noon   torsemide  (DEMADEX ) 100 MG tablet Take 100 mg by mouth daily.   05/05/2024 Noon   warfarin (COUMADIN ) 2 MG tablet Take 1.5-2 tablets (3-4 mg total) by mouth See admin instructions. 4mg  on Tuesday and 3 mg daily the rest of the week. (Patient taking differently: Take 3-4 mg by mouth See admin instructions. Take 3 mg by mouth every day)   05/05/2024 at 11:00 AM    Assessment: 73 yo female history of aortic stenosis s/p mechanical AVR and ascending aneurysm repair, chronic HFpEF and RV failure, CAD with prior CAB, CKD 3, permanent  afib, chronic respiratory failure/COPD on 4L [place], admitted with SOB. Pharmacy asked to dose coumadin .  INR 4.4> 4.6> 6.4> 4.9 , will continue to Hold coumadin .No overt bleeding noted. Hgb 9.8> 9.2, track and trend. Diuresing patient .  Home dose recently changed 7/14 d/t elevated INR 3.3 >>  3mg  daily.  Goal of Therapy:  INR 2-3 Monitor platelets by anticoagulation protocol: Yes   Plan:  No coumadin  today Daily PT-INR Monitor for S/S of bleeding  Cherlyn Boers, BS Pharm D, BCPS Clinical Pharmacist 05/08/2024,8:07 AM

## 2024-05-08 NOTE — Plan of Care (Signed)
   Problem: Clinical Measurements: Goal: Diagnostic test results will improve Outcome: Progressing

## 2024-05-08 NOTE — Progress Notes (Signed)
 Pt is anxious and pulling Bipap off despite redirection. She is attempting to climb out of bed. Bipap removed and placed back on 4 L [place]. Pt is demanding phone to call her son. She was oriented to time ( midnight) but she still prefers to call him. Phone provided. Dr Charlton was informed and ativan  order placed. Kellogg RN

## 2024-05-08 NOTE — Progress Notes (Signed)
 Date and time results received: 05/08/24  0652  Test: INR Critical Value: 4.9  Name of Provider Notified: Dr ONEIDA. Opyd  Orders Received? Or Actions Taken?: <MD informed.   Bertina Roark PEAK

## 2024-05-08 NOTE — Progress Notes (Signed)
 Patient is confused , now with mits. Has pulled BiPAP off several times. Wide awake.

## 2024-05-08 NOTE — Progress Notes (Signed)
 Progress Note   Patient: [name] FMW:[id number] DOB: 10-22-1950 DOA: 05/05/2024     3 DOS: the patient was seen and examined on 05/08/2024   Brief hospital admission narrative: As per H&P written by Dr. Sherlon on 05/05/2024 [name]  is a 73 y.o. female,  with longstanding history of COPD and chronic respiratory failure typically on 4 L/min supplemental oxygen , chronic atrial fibrillation, HTN, HLD, CAD status post 2V CABG with mechanical aortic valve replacement, patient on home oxygen , not on BiPAP or CPAP at home, patient most recent hospitalization was at [facility] for respiratory failure secondary to CHF and COPD. - She presents today secondary to complaints of dyspnea, continuous and deconditioning, son reports she was unable to get out of bed secondary to difficulty breathing, weakness, has been compliant with her home medication including torsemide , no cough, no fever, no chest pain, no vomiting. - In ED she was with significant increased work of breathing, respiratory distress, ABG was obtained, showing significant respiratory acidosis with PCO2 of 78, labs significant for worsening creatinine at 2, BNP significantly elevated at 1203, chest x-ray with evidence of volume overload, lactic acid within normal limit, normal white blood cell count, INR elevated at 4.4, patient was started on BiPAP with improvement of her symptoms, [facility] hospitalist consulted to admit patient for further evaluation and management.  Assessment and plan 1-acute on chronic respiratory failure with hypoxia and hypercapnia - Appears to be secondary to COPD exacerbation along with acute on chronic diastolic heart failure - Excellent response to the use of BiPAP; for now we will use just nightly-continue daily weights, low-sodium diet and strict I's and O's - Close monitoring to patient's renal function and electrolytes - Continue IV diuresis (dose adjusted). - Will also continue treatment with  antibiotics, steroids, bronchodilator management and mucolytic's. - Continue to follow clinical response.  2-acute on chronic kidney injury - Most likely in the setting of cardiorenal syndrome - Continue IV diuresis and follow renal function trend - Minimize nephrotoxic agents and avoid the use of contrast and hypotension. - Creatinine currently 2.2; continue to follow renal function trend.  3-acute on chronic diastolic heart failure and COPD exacerbation - Treatment as mentioned above - Follow clinical response - Cardiology service consulted and will follow recommendations.  4-atrial fibrillation with mechanical aortic valve replacement - INR supratherapeutic; pharmacy on board and assisting with dose management; currently 4.9 (continue holding Coumadin ). - No overt bleeding appreciated - Continue treatment with Cardizem  for rate control - Continue telemetry monitoring - Goal is for potassium above 4 magnesium  above 2 as much as possible.  5-hypothyroidism - Continue Synthroid .  6-hypertension/hyperlipidemia - Continue current antihypertensive agents in the setting of Cardizem  and diuresis - Continue statin.  7-coronary artery disease - Patient reports no chest pain - Continue current medication regimen and outpatient follow-up with cardiology service.  8-anemia of chronic disease - Hemoglobin is stable and at baseline - No overt bleeding appreciated - Continue to follow trend/stability.  9-hypokalemia - In the setting of diuresis - Will provide repletion and follow trend - Continue monitoring telemetry.  Subjective:  No overnight events; no chest pain, no nausea vomiting.  Patient not using accessory muscles.  Fine crackles appreciated on exam and also seen trace edema on her lower extremities.  Physical Exam: Vitals:   05/08/24 0600 05/08/24 0620 05/08/24 0700 05/08/24 0800  BP: 110/61  (!) 98/51   Pulse: 83  89   Resp: (!) 24  (!) 22   Temp:  97.6 F (36.4 C)   TempSrc:    Axillary  SpO2: 90%  90%   Weight:  65.1 kg    Height:       General exam: Alert, awake, reporting no chest pain, no nausea vomiting.  No overnight events. Respiratory system: Fine crackles appreciated at the bases; positive rhonchi.  Not using accessory muscle. Cardiovascular system: Rate controlled, no rubs, no gallops, soft murmur and positive metallic click appreciated on exam. Gastrointestinal system: Abdomen is nondistended, soft and nontender. No organomegaly or masses felt. Normal bowel sounds heard. Central nervous system: Moving 4 limbs spontaneously.  No focal neurological deficits. Extremities: No cyanosis or clubbing; trace edema appreciated bilaterally. Skin: No petechiae. Psychiatry: Flat affect appreciated on exam.   Data Reviewed: INR: 6.4>>4.9 Basic metabolic panel: Sodium sodium 863, potassium 2.8, chloride 94, bicarb 33, BUN 44, creatinine 2.2 and GFR 23 CBC: White blood cell 5.6, hemoglobin 9.21 platelet count 156K  Family Communication: Son updated at bedside.  Disposition: Status is: Inpatient Remains inpatient appropriate because: IV diuresis.  Anticipating discharge back home with home health services when medically stable.  Time spent: 50 minutes  Author: Eric Nunnery, MD 05/08/2024 1:39 PM  For on call review [email or web address].

## 2024-05-09 DIAGNOSIS — I5031 Acute diastolic (congestive) heart failure: Secondary | ICD-10-CM | POA: Diagnosis not present

## 2024-05-09 DIAGNOSIS — I4821 Permanent atrial fibrillation: Secondary | ICD-10-CM

## 2024-05-09 DIAGNOSIS — J441 Chronic obstructive pulmonary disease with (acute) exacerbation: Secondary | ICD-10-CM | POA: Diagnosis not present

## 2024-05-09 DIAGNOSIS — I2510 Atherosclerotic heart disease of native coronary artery without angina pectoris: Secondary | ICD-10-CM | POA: Diagnosis not present

## 2024-05-09 DIAGNOSIS — J9601 Acute respiratory failure with hypoxia: Secondary | ICD-10-CM | POA: Diagnosis not present

## 2024-05-09 DIAGNOSIS — I2583 Coronary atherosclerosis due to lipid rich plaque: Secondary | ICD-10-CM

## 2024-05-09 DIAGNOSIS — Z952 Presence of prosthetic heart valve: Secondary | ICD-10-CM | POA: Diagnosis not present

## 2024-05-09 DIAGNOSIS — J9602 Acute respiratory failure with hypercapnia: Secondary | ICD-10-CM

## 2024-05-09 DIAGNOSIS — N179 Acute kidney failure, unspecified: Secondary | ICD-10-CM | POA: Diagnosis not present

## 2024-05-09 DIAGNOSIS — I5189 Other ill-defined heart diseases: Secondary | ICD-10-CM

## 2024-05-09 DIAGNOSIS — Z7189 Other specified counseling: Secondary | ICD-10-CM

## 2024-05-09 LAB — BASIC METABOLIC PANEL WITH GFR
Anion gap: 12 (ref 5–15)
BUN: 48 mg/dL — ABNORMAL HIGH (ref 8–23)
CO2: 36 mmol/L — ABNORMAL HIGH (ref 22–32)
Calcium: 8.8 mg/dL — ABNORMAL LOW (ref 8.9–10.3)
Chloride: 88 mmol/L — ABNORMAL LOW (ref 98–111)
Creatinine, Ser: 2.15 mg/dL — ABNORMAL HIGH (ref 0.44–1.00)
GFR, Estimated: 24 mL/min — ABNORMAL LOW (ref >60)
Glucose, Bld: 139 mg/dL — ABNORMAL HIGH (ref 70–99)
Potassium: 3.5 mmol/L (ref 3.5–5.1)
Sodium: 136 mmol/L (ref 135–145)

## 2024-05-09 LAB — PROTIME-INR
INR: 3.5 — ABNORMAL HIGH (ref 0.8–1.2)
Prothrombin Time: 36.8 s — ABNORMAL HIGH (ref 11.4–15.2)

## 2024-05-09 LAB — BRAIN NATRIURETIC PEPTIDE: B Natriuretic Peptide: 916 pg/mL — ABNORMAL HIGH (ref 0.0–100.0)

## 2024-05-09 LAB — MAGNESIUM: Magnesium: 2.4 mg/dL (ref 1.7–2.4)

## 2024-05-09 MED ORDER — IPRATROPIUM-ALBUTEROL 0.5-2.5 (3) MG/3ML IN SOLN
3.0000 mL | Freq: Three times a day (TID) | RESPIRATORY_TRACT | Status: DC
  Administered 2024-05-10 (×2): 3 mL via RESPIRATORY_TRACT
  Filled 2024-05-09 (×2): qty 3

## 2024-05-09 MED ORDER — POTASSIUM CHLORIDE CRYS ER 20 MEQ PO TBCR
40.0000 meq | EXTENDED_RELEASE_TABLET | Freq: Once | ORAL | Status: AC
  Administered 2024-05-09: 40 meq via ORAL
  Filled 2024-05-09: qty 2

## 2024-05-09 MED ORDER — POLYETHYLENE GLYCOL 3350 17 G PO PACK
17.0000 g | PACK | Freq: Every day | ORAL | Status: DC
  Administered 2024-05-09 – 2024-05-10 (×2): 17 g via ORAL
  Filled 2024-05-09 (×2): qty 1

## 2024-05-09 MED ORDER — METHYLPREDNISOLONE SODIUM SUCC 40 MG IJ SOLR
40.0000 mg | Freq: Two times a day (BID) | INTRAMUSCULAR | Status: DC
  Administered 2024-05-09 – 2024-05-10 (×2): 40 mg via INTRAVENOUS
  Filled 2024-05-09 (×2): qty 1

## 2024-05-09 MED ORDER — DOCUSATE SODIUM 100 MG PO CAPS
100.0000 mg | ORAL_CAPSULE | Freq: Two times a day (BID) | ORAL | Status: DC
  Administered 2024-05-09 – 2024-05-10 (×2): 100 mg via ORAL
  Filled 2024-05-09 (×2): qty 1

## 2024-05-09 MED ORDER — FLEET ENEMA RE ENEM
1.0000 | ENEMA | Freq: Once | RECTAL | Status: AC
  Administered 2024-05-09: 1 via RECTAL

## 2024-05-09 MED ORDER — SODIUM CHLORIDE 0.9 % IV SOLN
1.0000 g | INTRAVENOUS | Status: AC
  Administered 2024-05-09: 1 g via INTRAVENOUS
  Filled 2024-05-09: qty 10

## 2024-05-09 NOTE — Progress Notes (Signed)
 Patient is still slightly confused not wearig BiPAP

## 2024-05-09 NOTE — TOC Progression Note (Signed)
 Transition of Care [facility]) - Progression Note    Patient Details  Name: [name] MRN: [id number] Date of Birth: [birth year]  Transition of Care [facility]) CM/SW Contact  Sharlyne Stabs, RN Phone Number: 05/09/2024, 10:07 AM  Clinical Narrative:   Insurance auth received. Patient is not medically ready to discharge. Destiny at [facility] updated. Patient does not have a bipap of her own to use at the facility. Destiny will order one. TOC following.     Expected Discharge Plan: Skilled Nursing Facility Barriers to Discharge: Continued Medical Work up, SNF Pending bed offer  Expected Discharge Plan and Services       Living arrangements for the past 2 months: Single Family Home                    Social Determinants of Health (SDOH) Interventions SDOH Screenings   Food Insecurity: No Food Insecurity (05/06/2024)  Housing: Low Risk  (05/06/2024)  Transportation Needs: No Transportation Needs (05/06/2024)  Utilities: Not At Risk (05/06/2024)  Financial Resource Strain: Low Risk  (12/27/2023)   Received from [facility]  Physical Activity: Inactive (12/27/2023)   Received from [facility]  Social Connections: Socially Isolated (05/06/2024)  Stress: Stress Concern Present (12/27/2023)   Received from [facility]  Tobacco Use: Medium Risk (05/08/2024)  Health Literacy: Medium Risk (01/08/2024)   Received from [facility]    Readmission Risk Interventions    09/10/2023    2:02 PM  Readmission Risk Prevention Plan  Transportation Screening Complete  HRI or Home Care Consult Complete  Social Work Consult for Recovery Care Planning/Counseling Complete  Palliative Care Screening Not Applicable  Medication Review Oceanographer) Complete

## 2024-05-09 NOTE — Progress Notes (Signed)
   05/09/24 0800  ReDS Vest / Clip  Station Marker C  Ruler Value 35  ReDS Value Range < 36  ReDS Actual Value 35

## 2024-05-09 NOTE — Progress Notes (Signed)
 PHARMACY - ANTICOAGULATION CONSULT NOTE  Pharmacy Consult for Coumadin  Indication: atrial fibrillation  Allergies  Allergen Reactions   Penicillin G Itching   Penicillins Hives, Itching, Swelling and Rash   Dust Mite Extract Other (See Comments)    Seasonal allergies    Patient Measurements: Height: 5' 8 (172.7 cm) Weight: 62.1 kg (136 lb 14.5 oz) IBW/kg (Calculated) : 63.9 HEPARIN  DW (KG): 66.9  Vital Signs: Temp: 98.1 F (36.7 C) (07/21 0714) Temp Source: Oral (07/21 0714) BP: 129/82 (07/21 0800) Pulse Rate: 90 (07/21 0800)  Labs: Recent Labs    05/07/24 0205 05/07/24 0852 05/08/24 0502 05/09/24 0215  LABPROT 58.6*  --  47.6* 36.8*  INR 6.4*  --  4.9* 3.5*  CREATININE  --  2.38* 2.23* 2.15*    Estimated Creatinine Clearance: 23.2 mL/min (A) (by C-G formula based on SCr of 2.15 mg/dL (H)).   Medical History: Past Medical History:  Diagnosis Date   Anxiety disorder    Aortic stenosis    Status post St. Jude mechanical AVR 2007   Asthma    Atrial fibrillation (HCC)    Carcinoid tumor of colon 2007   Chronic diastolic heart failure (HCC)    Coronary atherosclerosis of native coronary artery    Status post CABG 2007   GERD (gastroesophageal reflux disease)    History of colonoscopy 2003   Dr. 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 Shaaron, benign biopsies   RLS (restless legs syndrome)    Schatzki's ring     Medications:  Medications Prior to Admission  Medication Sig Dispense Refill Last Dose/Taking   albuterol  (VENTOLIN  HFA) 108 (90 Base) MCG/ACT inhaler Inhale 1-2 puffs into the lungs daily as needed for wheezing or shortness of breath.   Unknown   allopurinol  (ZYLOPRIM ) 100 MG tablet Take 100 mg by mouth daily.   05/05/2024 Noon   ALPRAZolam  (XANAX ) 0.5 MG tablet Take 1 tablet (0.5 mg total) by mouth 2 (two) times daily. 10 tablet 0 05/04/2024 Bedtime   CAPLYTA  42  MG capsule Take 42 mg by mouth at bedtime.   05/03/2024   diltiazem  (CARDIZEM  CD) 120 MG 24 hr capsule Take 1 capsule (120 mg total) by mouth daily. 30 capsule 2 05/05/2024 Noon   FARXIGA  10 MG TABS tablet Take 10 mg by mouth daily.   05/05/2024 Noon   ipratropium (ATROVENT ) 0.03 % nasal spray Place 2 sprays into both nostrils 2 (two) times daily as needed for rhinitis.   05/05/2024 Noon   levothyroxine  (SYNTHROID , LEVOTHROID) 50 MCG tablet Take 50 mcg by mouth daily before breakfast.    05/05/2024 Morning   metoprolol  succinate (TOPROL -XL) 50 MG 24 hr tablet Take 50 mg by mouth daily.   05/05/2024 Noon   Multiple Vitamin (MULTIVITAMIN WITH MINERALS) TABS tablet Take 1 tablet by mouth daily.   05/04/2024 Morning   omeprazole  (PRILOSEC) 20 MG capsule Take 20 mg by mouth daily.   05/05/2024 Morning   OXYGEN  Inhale 4 L into the lungs daily.   05/05/2024 Morning   rOPINIRole  (REQUIP ) 3 MG tablet Take 1 tablet (3 mg total) by mouth 2 (two) times daily. 10 tablet 0 05/05/2024 Noon   rosuvastatin  (CRESTOR ) 20 MG tablet TAKE ONE (1) TABLET BY MOUTH EVERY DAY 90 tablet 3 05/05/2024 Noon   torsemide  (DEMADEX ) 100 MG tablet Take 100 mg by mouth daily.   05/05/2024 Noon  warfarin (COUMADIN ) 2 MG tablet Take 1.5-2 tablets (3-4 mg total) by mouth See admin instructions. 4mg  on Tuesday and 3 mg daily the rest of the week. (Patient taking differently: Take 3-4 mg by mouth See admin instructions. Take 3 mg by mouth every day)   05/05/2024 at 11:00 AM    Assessment: 73 yo female history of aortic stenosis s/p mechanical AVR and ascending aneurysm repair, chronic HFpEF and RV failure, CAD with prior CAB, CKD 3, permanent afib, chronic respiratory failure/COPD on 4L [place], admitted with SOB. Pharmacy asked to dose coumadin .  INR 4.4> 4.6> 6.4> 4.9> 3.5 , will continue to Hold coumadin .No overt bleeding noted. Hgb 9.8> 9.2, track and trend. Diuresing patient .  Home dose recently changed 7/14 d/t elevated INR 3.3 >>  3mg   daily.  Goal of Therapy:  INR 2-3 Monitor platelets by anticoagulation protocol: Yes   Plan:  No coumadin  today Daily PT-INR Monitor for S/S of bleeding  Cherlyn Boers, BS Pharm D, BCPS Clinical Pharmacist 05/09/2024,8:52 AM

## 2024-05-09 NOTE — Progress Notes (Addendum)
 Progress Note  Patient Name: [name] Date of Encounter: 05/09/2024  [facility] Cardiologist: Jayson Sierras, MD   Patient Profile     Subjective   Continues to have good diuresis on Lasix  40mg  IV daily. Put out 2.5L yesterday and is net neg 4.7L since admit. SCr at baseline of 2.15 today.   No CP.  SOB improved.  Inpatient Medications    Scheduled Meds:  allopurinol   100 mg Oral Daily   ALPRAZolam   0.5 mg Oral BID   Chlorhexidine  Gluconate Cloth  6 each Topical QHS   diltiazem   120 mg Oral Daily   furosemide   40 mg Intravenous Daily   hydrocerin   Topical BID   ipratropium-albuterol   3 mL Nebulization Q6H   levothyroxine   50 mcg Oral QAC breakfast   methylPREDNISolone  (SOLU-MEDROL ) injection  125 mg Intravenous Daily   multivitamin with minerals  1 tablet Oral Daily   pantoprazole  (PROTONIX ) IV  40 mg Intravenous Q24H   rOPINIRole   3 mg Oral BID   rosuvastatin   20 mg Oral Daily   Continuous Infusions:  cefTRIAXone  (ROCEPHIN )  IV 1 g (05/08/24 2140)   norepinephrine  (LEVOPHED ) Adult infusion Stopped (05/05/24 2021)   PRN Meds: acetaminophen  **OR** acetaminophen , albuterol , bisacodyl , hydrALAZINE , magnesium  hydroxide, ondansetron  **OR** ondansetron  (ZOFRAN ) IV, sodium chloride    Vital Signs    Vitals:   05/09/24 0800 05/09/24 0808 05/09/24 0900 05/09/24 0904  BP: 129/82  (!) 114/48 (!) 114/48  Pulse: 90  87   Resp: (!) 21     Temp:      TempSrc:      SpO2: 97% 94% 98%   Weight:      Height:        Intake/Output Summary (Last 24 hours) at 05/09/2024 0941 Last data filed at 05/09/2024 0843 Gross per 24 hour  Intake 580.07 ml  Output 2800 ml  Net -2219.93 ml      05/09/2024    5:25 AM 05/08/2024    6:20 AM 05/07/2024    5:00 AM  Last 3 Weights  Weight (lbs) 136 lb 14.5 oz 143 lb 8.3 oz 145 lb 1 oz  Weight (kg) 62.1 kg 65.1 kg 65.8 kg      Telemetry    Atrial Fibrillation with HR in the 80's - Personally Reviewed  ECG    No new EKG  to review - Personally Reviewed  Physical Exam   GEN: No acute distress.   Neck: No JVD Cardiac: irregularly irregular, no murmurs, rubs, or gallops.  Respiratory: Clear to auscultation bilaterally. GI: Soft, nontender, non-distended  MS: No edema; No deformity. Neuro:  Nonfocal  Psych: Normal affect   Labs    High Sensitivity Troponin:   Recent Labs  Lab 05/05/24 1700 05/05/24 1918  TROPONINIHS 19* 18*      Chemistry Recent Labs  Lab 05/05/24 1700 05/06/24 0424 05/07/24 0852 05/07/24 1642 05/08/24 0502 05/09/24 0215  NA 141   < > 138  --  136 136  K 3.5   < > 2.7* 3.2* 2.8* 3.5  CL 100   < > 91*  --  94* 88*  CO2 28   < > 31  --  33* 36*  GLUCOSE 102*   < > 187*  --  135* 139*  BUN 23   < > 40*  --  44* 48*  CREATININE 2.02*   < > 2.38*  --  2.23* 2.15*  CALCIUM  8.6*   < > 8.5*  --  8.5* 8.8*  PROT 7.4  --   --   --   --   --   ALBUMIN 3.7  --   --   --   --   --   AST 26  --   --   --   --   --   ALT 14  --   --   --   --   --   ALKPHOS 77  --   --   --   --   --   BILITOT 0.8  --   --   --   --   --   GFRNONAA 26*   < > 21*  --  23* 24*  ANIONGAP 13   < > 16*  --  9 12   < > = values in this interval not displayed.     Hematology Recent Labs  Lab 05/05/24 1700 05/06/24 0424  WBC 6.8 5.6  RBC 3.54* 3.47*  HGB 9.8* 9.2*  HCT 32.5* 31.9*  MCV 91.8 91.9  MCH 27.7 26.5  MCHC 30.2 28.8*  RDW 15.9* 15.8*  PLT 199 156    BNP Recent Labs  Lab 05/05/24 1700  BNP 1,203.0*     DDimer No results for input(s): DDIMER in the last 168 hours.   CHA2DS2-VASc Score = 5   This indicates a 7.2% annual risk of stroke. The patient's score is based upon: CHF History: 1 HTN History: 1 Diabetes History: 0 Stroke History: 0 Vascular Disease History: 1 Age Score: 1 Gender Score: 1      Radiology    No results found.  Patient Profile     73 y.o. female  with a hx of aortic stenosis (s/p St. Jude mechanical AVR in 2007 with repair of ascending  aortic aneurysm), CAD with prior CABG, permanent atrial fibrillation, chronic HFpEF, HTN, HLD, Stage 3 CKD, possible LA mass (noted on echo in 08/2023 and outpatient Cardiac CT recommended once clinically stable but never obtained), COPD and chronic hypoxic respiratory failure who is being seen 05/06/2024 for the evaluation of acute CHF at the request of Dr. Ricky.   Assessment & Plan    1. Acute HFpEF - Presented with worsening dyspnea and weakness --> found to have acute on chronic hypoxic respiratory failure and initially required BiPAP. BNP at 1203 on admission and CXR consistent with CHF.  - Initially received IV Lasix  40mg  x2 and Metolazone  5mg  while in the ED.  - SCr bumped to 2.38 over the weekend and now on Lasix  40mg  IV daily with good diuresis. - UOP 2.5L yesterday and net neg 4.7L since admit - SCr improved to  2.1 today, K+ 3.5 (baseline SCr 1.4-1.55) - No indication to repeat an echo at this time given her recent study on 04/29/2024 (showed EF of 60-65% with elevated RV pressure and volume overload but RV function not well visualized). - Given her worsening renal function and urinary retention, would hold Farxiga  for now. Would consider risks vs. benefits of resuming prior to discharge given her lower extremity wounds.  - repeat BNP today - weight down to 136lbs today (down 14lbs from Friday).  Her Weight at discharge 01/01/2024 was 143lbs) - ReDS Vest 35% today - Since weight is below her dry weight and SCr still above her baseline>>will hold diuretics for now until we have results of BNP  - repeat BMET in am  2. History of AVR - She is s/p St. Jude mechanical AVR in 2007  with repair of ascending aortic aneurysm. Recent echo showed normal function of her valve with no stenosis or regurgitation.    3. CAD with prior CABG - denies any anginal chest pain - Hs Troponin values have been flat at 19 and 18.  - No plans for ischemic evaluation at this time.  - continue Crestor  20mg   daily  - no ASA due to DOAC   4. Permanent Atrial Fibrillation - HR well controlled in the 80's - continue Cardizem  CD to 180mg  daily  - was on Toprol  prior to admit but stopped due to soft BP   - On Coumadin  for anticoagulation. Currently held given elevated INR but INR is 3.5 today - Pharmacy monitoring and dosing warfarin>>will likely restart coumadin  tomorrow pending INR   5.Possible LA mass  - Noted on echo in 08/2023 and outpatient Cardiac CT recommended once clinically stable but never obtained. - Recent echo showed persistent 3.4 x 2.4 cm round density adjacent to the interatrial septum within the left atrium was again visualized and was recommended to consider TEE or cardiac CT for further assessment.  - Would not be an ideal candidate for TEE given her respiratory status. Could again consider Coronary CTA as an outpatient based off respiratory status and renal function. Not a candidate currently given creatinine at 2.18.   6. AKI on Stage 3 CKD - Baseline creatinine 1.4 - 1.5.  - At 2.02 on admission and trending up to 2.38 last Friday - improved to 2.1 this am - will hold Lasix  for now since weight significantly down from baseline and may be dry at this time - BNP pending - repeat BMET in am   7. COPD/Chronic Hypoxic Respiratory Failure - On 4L [place] at baseline.  - Initially required BiPAP and now on [place] at 5L with O2 sats 94% - Diuresing as above and also being treated with antibiotics and steroids. Management per the admitting team.     I spent 35 minutes caring for this patient today face to face, ordering and reviewing labs, reviewing records from 2D echo, hospitalization 12/2023 , seeing the patient, documenting in the record  For questions or updates, please contact [facility] Please consult [email or web address] for contact info under        Signed, Wilbert Bihari, MD  05/09/2024, 9:41 AM

## 2024-05-09 NOTE — Progress Notes (Signed)
 Heart Failure Navigator Progress Note  Attempted to reach patient in room ICO4 @ [facility] for Heart Failure Education via telephone.  Patient did not answer the phone. Navigator to attempt to reach patient again tomorrow.  Will ask the RN to provide patient with the Living Better With Heart Failure  folder at the bedside.  Navigator available for reassessment of patient.   [name], RN, BSN [facility] Heart Failure Navigator Secure Chat Only

## 2024-05-09 NOTE — Consult Note (Signed)
 Consultation Note Date: 05/09/2024   Patient Name: [name]  DOB: 01-Feb-1951  MRN: [id number]  Age / Sex: 73 y.o., female  PCP: Renato Dorothey HERO, NP Referring Physician: Ricky Fines, MD  Reason for Consultation: goals of care  HPI/Patient Profile: 73 y.o. female  with past medical history of COPD, chronic respiratory failure on 4L oxygen , a-fib, admitted on 05/05/2024 with COPD and CHF exacerbation. Has been complicated by hypercarbia that is improving with Bipap. She is diuresing well- had some elevation of kidney numbers but improving. Palliative medicine consulted for GOC.    Primary Decision Maker NEXT OF KIN - son [name]  Discussion: Chart reviewed including labs, progress notes, imaging from this and previous encounters.  Per Cardiology note- patient is diuresing well with lasix , lasix  on hold for now.  Labs reviewed- noted Cr today at 2.15 (2.23, 2.38 previous days). Chest xray from 7.17 reviewed indicating pulmonary edema.  On evaluation patient sitting/lying in chair. She was able to pull herself up in the chair with assistance. She is sleeping but arouses with encouragement.  She is oriented to person place and year- however, she is unable to tell me about her health- she states she is in the hospital because of her legs.  Her son [name] is at bedside. He is her only surviving child. She is widowed. She and [name] live together.  Prior to admission she was able to ambulate in the home. Would occasionally cook for them.  She has had 4 hospitalizations in the last year- but [name] notes she usually recovers well with help from rehab.  We discussed her acute and chronic illnesses. Reviewed the trajectory of chronic heart failure and COPD with exacerbations and overall worsening over time until end of life.  [name] has been in touch with [facility] and they have discussed  eventual hospice care. Currently, patient's goals of care are to maximize hospitalization, treat what is treatable and d/c to SNF for rehab before returning home with [name].  We discussed that there may come a time with patient will not benefit from rehab and will need more care. [name] has been discussing this with [facility] and looking into options for her care in the future.  If she were to decline again [name] would want rehospitalization and treatment for now.     SUMMARY OF RECOMMENDATIONS -Recurrent COPD/CHF exacerbations- continue to treat what is treatable, goal is d/c to SNF for rehab- patient is followed by [facility] outpatient Palliative    Code Status/Advance Care Planning:   Code Status: Limited: Do not attempt resuscitation (DNR) -DNR-LIMITED -Do Not Intubate/DNI     Prognosis:   Unable to determine  Discharge Planning: Skilled Nursing Facility for rehab with Palliative care service follow-up  Primary Diagnoses: Present on Admission:  Acute on chronic respiratory failure (HCC)  Physical deconditioning  Hypothyroidism  Hyperlipidemia  Coronary artery disease of bypass graft of native heart with stable angina pectoris (HCC)  COPD with acute exacerbation (HCC)  CKD (chronic kidney disease) stage 3, GFR  30-59 ml/min (HCC)  Atrial fibrillation, chronic (HCC)  Atrial fibrillation with RVR (HCC)   Review of Systems  Physical Exam  Vital Signs: BP 106/65   Pulse 86   Temp 98 F (36.7 C) (Oral)   Resp (!) 21   Ht 5' 8 (1.727 m)   Wt 62.1 kg   SpO2 98%   BMI 20.82 kg/m  Pain Scale: 0-10 POSS *See Group Information*: 1-Acceptable,Awake and alert Pain Score: 0-No pain   SpO2: SpO2: 98 % O2 Device:SpO2: 98 % O2 Flow Rate: .O2 Flow Rate (L/min): 5 L/min  IO: Intake/output summary:  Intake/Output Summary (Last 24 hours) at 05/09/2024 1215 Last data filed at 05/09/2024 9156 Gross per 24 hour  Intake 580.07 ml  Output 2800 ml  Net -2219.93 ml    LBM: Last BM Date :   (patient unable to recall) Baseline Weight: Weight: 64.4 kg Most recent weight: Weight: 62.1 kg       Thank you for this consult. Palliative medicine will continue to follow and assist as needed.  Time Total: 85 mins Signed by: Cassondra Stain, AGNP-C Palliative Medicine  Time includes:   Preparing to see the patient (e.g., review of tests) Obtaining and/or reviewing separately obtained history Performing a medically necessary appropriate examination and/or evaluation Counseling and educating the patient/family/caregiver Ordering medications, tests, or procedures Referring and communicating with other health care professionals (when not reported separately) Documenting clinical information in the electronic or other health record Independently interpreting results (not reported separately) and communicating results to the patient/family/caregiver Care coordination (not reported separately) Clinical documentation   Please contact Palliative Medicine Team phone at [phone] for questions and concerns.  For individual provider: See Tracey

## 2024-05-09 NOTE — Plan of Care (Signed)

## 2024-05-09 NOTE — Progress Notes (Signed)
 Progress Note   Patient: [name] FMW:[id number] DOB: 17-Mar-1951 DOA: 05/05/2024     4 DOS: the patient was seen and examined on 05/09/2024   Brief hospital admission narrative: As per H&P written by Dr. Sherlon on 05/05/2024 [name]  is a 73 y.o. female,  with longstanding history of COPD and chronic respiratory failure typically on 4 L/min supplemental oxygen , chronic atrial fibrillation, HTN, HLD, CAD status post 2V CABG with mechanical aortic valve replacement, patient on home oxygen , not on BiPAP or CPAP at home, patient most recent hospitalization was at [facility] for respiratory failure secondary to CHF and COPD. - She presents today secondary to complaints of dyspnea, continuous and deconditioning, son reports she was unable to get out of bed secondary to difficulty breathing, weakness, has been compliant with her home medication including torsemide , no cough, no fever, no chest pain, no vomiting. - In ED she was with significant increased work of breathing, respiratory distress, ABG was obtained, showing significant respiratory acidosis with PCO2 of 78, labs significant for worsening creatinine at 2, BNP significantly elevated at 1203, chest x-ray with evidence of volume overload, lactic acid within normal limit, normal white blood cell count, INR elevated at 4.4, patient was started on BiPAP with improvement of her symptoms, [facility] hospitalist consulted to admit patient for further evaluation and management.  Assessment and plan 1-acute on chronic respiratory failure with hypoxia and hypercapnia - Appears to be secondary to COPD exacerbation along with acute on chronic diastolic heart failure - Excellent response to the use of BiPAP; for now we will use just nightly-continue daily weights, low-sodium diet and strict I's and O's - Close monitoring to patient's renal function and electrolytes - Currently roughly 14 pounds lighter since admission.  Rates sleep 35% - Following  cardiology service recommendations will hold diuretics for the rest of the day and repeat BNP. - Will continue treatment with antibiotics (today's last dose), starting steroids tapering and also continue bronchodilator management and mucolytic's. - Continue to follow clinical response.  2-acute on chronic kidney injury - Most likely in the setting of cardiorenal syndrome - Continue IV diuresis and follow renal function trend - Minimize nephrotoxic agents and avoid the use of contrast and hypotension. - Creatinine currently 2.15; continue to follow renal function trend.  3-acute on chronic diastolic heart failure and COPD exacerbation - Treatment as mentioned above - Follow clinical response - Cardiology service consulted and will follow recommendations.  4-atrial fibrillation with mechanical aortic valve replacement - INR supratherapeutic; pharmacy on board and assisting with dose management; currently 4.9 (continue holding Coumadin ). - No overt bleeding appreciated - Continue treatment with Cardizem  for rate control - Continue telemetry monitoring - Goal is for potassium above 4 magnesium  above 2 as much as possible.  5-hypothyroidism - Continue Synthroid .  6-hypertension/hyperlipidemia - Continue current antihypertensive agents in the setting of Cardizem  and diuresis - Continue statin.  7-coronary artery disease - Patient reports no chest pain - Continue current medication regimen and outpatient follow-up with cardiology service.  8-anemia of chronic disease - Hemoglobin is stable and at baseline - No overt bleeding appreciated - Continue to follow trend/stability.  9-hypokalemia - In the setting of diuresis - Will provide repletion and continue to follow trend - Continue monitoring telemetry.  10-physical therapy and deconditioning - Evaluated by PT with recommendations for short-term rehab at a skilled nursing facility - Hopefully achieving medical stability/readiness  on 05/10/2024 in order to discharge her for rehab and conditioning.  Subjective:  Patient reports improvement in her breathing; denying orthopnea.  Good urine output.  Afebrile.  Good saturation on 4.5-5 L nasal cannula supplementation.  Physical Exam: Vitals:   05/09/24 1400 05/09/24 1445 05/09/24 1500 05/09/24 1600  BP: 129/78  126/75 138/76  Pulse: 95  93 82  Resp: 20     Temp:      TempSrc:      SpO2: 96% 100% 99% 96%  Weight:      Height:       General exam: Alert, awake, following commands appropriately and reporting feeling better. Respiratory system: Decreased breath sounds at the bases; no using accessory muscles.  Mild expiratory wheezing on exam. Cardiovascular system: Rate controlled; no rubs, no gallops, no JVD. Gastrointestinal system: Abdomen is nondistended, soft and nontender. No organomegaly or masses felt. Normal bowel sounds heard. Central nervous system: Generally weak.  Moving 4 limbs spontaneously.  No focal neurological deficits. Extremities: No cyanosis or clubbing. Skin: No petechiae. Psychiatry: Flat affect on exam.   Latest data Reviewed: INR: 6.4>>4.9>>3.5 Magnesium : 2.4 Basic metabolic panel: Sodium 136, potassium 3.5, chloride 88, bicarb 36, BUN 48, creatinine 2.15 and GFR 24 CBC: White blood cell 5.6, hemoglobin 9.21 platelet count 156K  Family Communication: Son updated at bedside.  Disposition: Status is: Inpatient Remains inpatient appropriate because: IV diuresis.  Anticipating discharge back home with home health services when medically stable.  Time spent: 50 minutes  Author: Eric Nunnery, MD 05/09/2024 5:00 PM  For on call review [email or web address].

## 2024-05-09 NOTE — Progress Notes (Signed)
 Patient states that she does not want to wear BIPAP tonight. She didn't wear last PM either. Unit at bedside if she changes her mind.

## 2024-05-09 NOTE — Progress Notes (Signed)
 Living with heart failure booklet given to patient and patient son, Alm.

## 2024-05-10 DIAGNOSIS — N183 Chronic kidney disease, stage 3 unspecified: Secondary | ICD-10-CM | POA: Diagnosis not present

## 2024-05-10 DIAGNOSIS — I4821 Permanent atrial fibrillation: Secondary | ICD-10-CM | POA: Diagnosis not present

## 2024-05-10 DIAGNOSIS — I5031 Acute diastolic (congestive) heart failure: Secondary | ICD-10-CM | POA: Diagnosis not present

## 2024-05-10 DIAGNOSIS — I5189 Other ill-defined heart diseases: Secondary | ICD-10-CM | POA: Diagnosis not present

## 2024-05-10 DIAGNOSIS — I482 Chronic atrial fibrillation, unspecified: Secondary | ICD-10-CM | POA: Diagnosis not present

## 2024-05-10 DIAGNOSIS — J9621 Acute and chronic respiratory failure with hypoxia: Secondary | ICD-10-CM | POA: Diagnosis not present

## 2024-05-10 DIAGNOSIS — N179 Acute kidney failure, unspecified: Secondary | ICD-10-CM | POA: Diagnosis not present

## 2024-05-10 LAB — CBC
HCT: 29.6 % — ABNORMAL LOW (ref 36.0–46.0)
Hemoglobin: 9 g/dL — ABNORMAL LOW (ref 12.0–15.0)
MCH: 26.6 pg (ref 26.0–34.0)
MCHC: 30.4 g/dL (ref 30.0–36.0)
MCV: 87.6 fL (ref 80.0–100.0)
Platelets: 195 K/uL (ref 150–400)
RBC: 3.38 MIL/uL — ABNORMAL LOW (ref 3.87–5.11)
RDW: 15.8 % — ABNORMAL HIGH (ref 11.5–15.5)
WBC: 7.1 K/uL (ref 4.0–10.5)
nRBC: 0 % (ref 0.0–0.2)

## 2024-05-10 LAB — BASIC METABOLIC PANEL WITH GFR
Anion gap: 10 (ref 5–15)
BUN: 48 mg/dL — ABNORMAL HIGH (ref 8–23)
CO2: 43 mmol/L — ABNORMAL HIGH (ref 22–32)
Calcium: 8.7 mg/dL — ABNORMAL LOW (ref 8.9–10.3)
Chloride: 86 mmol/L — ABNORMAL LOW (ref 98–111)
Creatinine, Ser: 1.87 mg/dL — ABNORMAL HIGH (ref 0.44–1.00)
GFR, Estimated: 28 mL/min — ABNORMAL LOW (ref >60)
Glucose, Bld: 133 mg/dL — ABNORMAL HIGH (ref 70–99)
Potassium: 3.5 mmol/L (ref 3.5–5.1)
Sodium: 139 mmol/L (ref 135–145)

## 2024-05-10 LAB — PROTIME-INR
INR: 2.5 — ABNORMAL HIGH (ref 0.8–1.2)
Prothrombin Time: 27.8 s — ABNORMAL HIGH (ref 11.4–15.2)

## 2024-05-10 MED ORDER — TORSEMIDE 20 MG PO TABS
60.0000 mg | ORAL_TABLET | Freq: Two times a day (BID) | ORAL | Status: DC
  Administered 2024-05-10: 60 mg via ORAL
  Filled 2024-05-10: qty 3

## 2024-05-10 MED ORDER — TORSEMIDE 60 MG PO TABS: 60.0000 mg | ORAL_TABLET | Freq: Two times a day (BID) | ORAL | Status: DC

## 2024-05-10 MED ORDER — WARFARIN SODIUM 2 MG PO TABS: 3.0000 mg | ORAL_TABLET | Freq: Once | ORAL | Status: DC

## 2024-05-10 MED ORDER — PREDNISONE 20 MG PO TABS: ORAL_TABLET | ORAL | Status: DC

## 2024-05-10 MED ORDER — POLYETHYLENE GLYCOL 3350 17 G PO PACK: 17.0000 g | PACK | Freq: Every day | ORAL | 0 refills | Status: DC

## 2024-05-10 MED ORDER — ALPRAZOLAM 0.5 MG PO TABS: 0.5000 mg | ORAL_TABLET | Freq: Two times a day (BID) | ORAL | 0 refills | Status: DC

## 2024-05-10 MED ORDER — WARFARIN - PHARMACIST DOSING INPATIENT: Freq: Every day | Status: DC

## 2024-05-10 MED ORDER — METOPROLOL SUCCINATE ER 25 MG PO TB24: 25.0000 mg | ORAL_TABLET | Freq: Every day | ORAL | Status: DC

## 2024-05-10 NOTE — Plan of Care (Signed)

## 2024-05-10 NOTE — Progress Notes (Signed)
 Progress Note  Patient Name: [name] Date of Encounter: 05/10/2024  [facility] Cardiologist: Jayson Sierras, MD     Subjective   Pt is breathing some better   No CP   Inpatient Medications    Scheduled Meds:  allopurinol   100 mg Oral Daily   ALPRAZolam   0.5 mg Oral BID   Chlorhexidine  Gluconate Cloth  6 each Topical QHS   diltiazem   120 mg Oral Daily   docusate sodium   100 mg Oral BID   hydrocerin   Topical BID   ipratropium-albuterol   3 mL Nebulization TID   levothyroxine   50 mcg Oral QAC breakfast   methylPREDNISolone  (SOLU-MEDROL ) injection  40 mg Intravenous Q12H   multivitamin with minerals  1 tablet Oral Daily   pantoprazole  (PROTONIX ) IV  40 mg Intravenous Q24H   polyethylene glycol  17 g Oral Daily   rOPINIRole   3 mg Oral BID   rosuvastatin   20 mg Oral Daily   torsemide   60 mg Oral BID   warfarin  3 mg Oral ONCE-1600   Warfarin - Pharmacist Dosing Inpatient   Does not apply q1600   Continuous Infusions:   PRN Meds: acetaminophen  **OR** acetaminophen , albuterol , hydrALAZINE , ondansetron  **OR** ondansetron  (ZOFRAN ) IV, sodium chloride    Vital Signs    Vitals:   05/10/24 0600 05/10/24 0741 05/10/24 0837 05/10/24 0839  BP: 132/80  122/74   Pulse:      Resp: 15     Temp:  98.3 F (36.8 C)    TempSrc:  Oral    SpO2:    94%  Weight:      Height:        Intake/Output Summary (Last 24 hours) at 05/10/2024 0914 Last data filed at 05/09/2024 1800 Gross per 24 hour  Intake --  Output 900 ml  Net -900 ml   Net neg 5.9 L      05/10/2024    4:56 AM 05/09/2024    5:25 AM 05/08/2024    6:20 AM  Last 3 Weights  Weight (lbs) 134 lb 7.7 oz 136 lb 14.5 oz 143 lb 8.3 oz  Weight (kg) 61 kg 62.1 kg 65.1 kg      Telemetry    Afib 80s  - Personally Reviewed  ECG    No new EKG to review - Personally Reviewed  Physical Exam   GEN: No acute distress.   Neck: No JVD Cardiac: irregularly irregular, Crisp valve sounds  Respiratory: Clear to  auscultation bilaterally. GI:  No hepatomegaly  MS: No LE  edema;  Labs    High Sensitivity Troponin:   Recent Labs  Lab 05/05/24 1700 05/05/24 1918  TROPONINIHS 19* 18*      Chemistry Recent Labs  Lab 05/05/24 1700 05/06/24 0424 05/08/24 0502 05/09/24 0215 05/10/24 0417  NA 141   < > 136 136 139  K 3.5   < > 2.8* 3.5 3.5  CL 100   < > 94* 88* 86*  CO2 28   < > 33* 36* 43*  GLUCOSE 102*   < > 135* 139* 133*  BUN 23   < > 44* 48* 48*  CREATININE 2.02*   < > 2.23* 2.15* 1.87*  CALCIUM  8.6*   < > 8.5* 8.8* 8.7*  PROT 7.4  --   --   --   --   ALBUMIN 3.7  --   --   --   --   AST 26  --   --   --   --  ALT 14  --   --   --   --   ALKPHOS 77  --   --   --   --   BILITOT 0.8  --   --   --   --   GFRNONAA 26*   < > 23* 24* 28*  ANIONGAP 13   < > 9 12 10    < > = values in this interval not displayed.     Hematology Recent Labs  Lab 05/05/24 1700 05/06/24 0424 05/10/24 0417  WBC 6.8 5.6 7.1  RBC 3.54* 3.47* 3.38*  HGB 9.8* 9.2* 9.0*  HCT 32.5* 31.9* 29.6*  MCV 91.8 91.9 87.6  MCH 27.7 26.5 26.6  MCHC 30.2 28.8* 30.4  RDW 15.9* 15.8* 15.8*  PLT 199 156 195    BNP Recent Labs  Lab 05/05/24 1700 05/09/24 0215  BNP 1,203.0* 916.0*     DDimer No results for input(s): DDIMER in the last 168 hours.   CHA2DS2-VASc Score = 5   This indicates a 7.2% annual risk of stroke. The patient's score is based upon: CHF History: 1 HTN History: 1 Diabetes History: 0 Stroke History: 0 Vascular Disease History: 1 Age Score: 1 Gender Score: 1   Radiology    No results found.  Patient Profile     73 y.o. female  with a hx of aortic stenosis (s/p St. Jude mechanical AVR in 2007 with repair of ascending aortic aneurysm), CAD with prior CABG, permanent atrial fibrillation, chronic HFpEF, HTN, HLD, Stage 3 CKD, possible LA mass (noted on echo in 08/2023 and outpatient Cardiac CT recommended once clinically stable but never obtained), COPD and chronic hypoxic  respiratory failure who is being seen 05/06/2024 for the evaluation of acute CHF at the request of Dr. Ricky.   Assessment & Plan    1. Acute HFpEF - Presented with worsening dyspnea and weakness --> found to have acute on chronic hypoxic respiratory failure and initially required BiPAP. BNP at 1203 on admission and CXR consistent with CHF.  - Initially received IV Lasix  40mg  x2 and Metolazone  5mg  while in the ED.  - SCr bumped to 2.38 over the weekend and now on Lasix  40mg  IV daily with good diuresis. - UOP 2.5L yesterday and net neg 4.7L since admit - SCr improved to  1.87 today, K+ 3.5 (baseline SCr 1.4-1.55) - No indication to repeat an echo at this time given her recent study on 04/29/2024 (showed EF of 60-65% with elevated RV pressure and volume overload but RV function not well visualized). - Given her worsening renal function and urinary retention, would hold Farxiga  for now. Would consider risks vs. benefits of resuming prior to discharge given her lower extremity wounds.  - ReDS Vest 35% yesterday  - BNP 916 yesterday ( in setting of afib) -Volume status overall appears OK  I would resume diuretics Thursday    Home dose torsemide  was 100 mg    She will need close follow up of renal function as outpt   May need to back down I would not restart Farxiga      Get BMET Monday    2. History of AVR - She is s/p St. Jude mechanical AVR in 2007 with repair of ascending aortic aneurysm. Recent echo JUly 2025 showed normal function of her valve with no stenosis or regurgitation.    3. CAD with prior CABG - denies any anginal chest pain - Hs Troponin values have been flat at 19 and 18.  -  continue Crestor  20mg  daily  - no ASA due to DOAC   4. Permanent Atrial Fibrillation - HR well controlled in the 80's - continue Cardizem  CD to 180mg  daily  - was on Toprol  prior to admit but stopped due to soft BP   - On Coumadin  for anticoagulation. INR 2.5 today      5.Possible LA mass  - Noted  on echo in 08/2023 and outpatient Cardiac CT recommended once clinically stable but never obtained. - Recent echo showed persistent 3.4 x 2.4 cm round density adjacent to the interatrial septum within the left atrium was again visualized I have reviewed images   Present in previous echoes  Most likely artifact     I would not recomm further imaging    6. AKI on Stage 3 CKD - Baseline creatinine 1.4 - 1.5.  - At 2.02 on admission and trending up to 2.38 last Friday - improved to 1.87 today    As noted above   Would resume torsemide  tomorrow      7. COPD/Chronic Hypoxic Respiratory Failure - On 4L [place] at baseline.  - Initially required BiPAP and now on  at 5L with O2 sats 94% - Being treated with antibiotics and steroids. Management per the admitting team.    For questions or updates, please contact [facility] Please consult [email or web address] for contact info under        Signed, Vina Gull, MD  05/10/2024, 9:14 AM

## 2024-05-10 NOTE — Plan of Care (Signed)

## 2024-05-10 NOTE — TOC Transition Note (Signed)
 Transition of Care [facility]) - Discharge Note   Patient Details  Name: [name] MRN: [id number] Date of Birth: Jan 21, 1951  [facility] CM/SW Contact:  Sharlyne Stabs, RN Phone Number: 05/10/2024, 1:35 PM   Clinical Narrative:   Patient is medically ready to discharge to [facility]. Destiny provided a room number. RN called report, Medical necessity printed and EMS scheduled. RN is aware they want the patient to arrive by 5PM.  CM updated her son and sent clinicals in the hub.     Final next level of care: Skilled Nursing Facility Barriers to Discharge: Barriers Resolved   Patient Goals and CMS Choice Patient states their goals for this hospitalization and ongoing recovery are:: agreeable to SNF CMS Medicare.gov Compare Post Acute Care list provided to:: Patient Represenative (must comment) Choice offered to / list presented to : Adult Children  ownership interest in [facility].provided to:: Adult Children    Discharge Placement                Patient to be transferred to facility by: EMS Name of family member notified: SON Patient and family notified of of transfer: 05/10/24  Discharge Plan and Services Additional resources added to the After Visit Summary for         Social Drivers of Health (SDOH) Interventions SDOH Screenings   Food Insecurity: No Food Insecurity (05/06/2024)  Housing: Low Risk  (05/06/2024)  Transportation Needs: No Transportation Needs (05/06/2024)  Utilities: Not At Risk (05/06/2024)  Financial Resource Strain: Low Risk  (12/27/2023)   Received from [facility]  Physical Activity: Inactive (12/27/2023)   Received from [facility]  Social Connections: Socially Isolated (05/06/2024)  Stress: Stress Concern Present (12/27/2023)   Received from [facility]  Tobacco Use: Medium Risk (05/08/2024)  Health Literacy: Medium Risk (01/08/2024)   Received from [facility]     Readmission Risk Interventions     09/10/2023    2:02 PM  Readmission Risk Prevention Plan  Transportation Screening Complete  HRI or Home Care Consult Complete  Social Work Consult for Recovery Care Planning/Counseling Complete  Palliative Care Screening Not Applicable  Medication Review [employer]) Complete

## 2024-05-10 NOTE — Progress Notes (Signed)
 EMS called for transport for patient to discharge to [facility] in [place].

## 2024-05-10 NOTE — Progress Notes (Signed)
 PHARMACY - ANTICOAGULATION CONSULT NOTE  Pharmacy Consult for Coumadin  Indication: atrial fibrillation  Allergies  Allergen Reactions   Penicillin G Itching   Penicillins Hives, Itching, Swelling and Rash   Dust Mite Extract Other (See Comments)    Seasonal allergies    Patient Measurements: Height: 5' 8 (172.7 cm) Weight: 61 kg (134 lb 7.7 oz) IBW/kg (Calculated) : 63.9 HEPARIN  DW (KG): 66.9  Vital Signs: Temp: 98.3 F (36.8 C) (07/22 0741) Temp Source: Oral (07/22 0741) BP: 132/80 (07/22 0600) Pulse Rate: 83 (07/22 0500)  Labs: Recent Labs    05/08/24 0502 05/09/24 0215 05/10/24 0417  HGB  --   --  9.0*  HCT  --   --  29.6*  PLT  --   --  195  LABPROT 47.6* 36.8* 27.8*  INR 4.9* 3.5* 2.5*  CREATININE 2.23* 2.15* 1.87*    Estimated Creatinine Clearance: 26.2 mL/min (A) (by C-G formula based on SCr of 1.87 mg/dL (H)).   Medical History: Past Medical History:  Diagnosis Date   Anxiety disorder    Aortic stenosis    Status post St. Jude mechanical AVR 2007   Asthma    Atrial fibrillation (HCC)    Carcinoid tumor of colon 2007   Chronic diastolic heart failure (HCC)    Coronary atherosclerosis of native coronary artery    Status post CABG 2007   GERD (gastroesophageal reflux disease)    History of colonoscopy 2003   Dr. 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 Shaaron, benign biopsies   RLS (restless legs syndrome)    Schatzki's ring     Medications:  Medications Prior to Admission  Medication Sig Dispense Refill Last Dose/Taking   albuterol  (VENTOLIN  HFA) 108 (90 Base) MCG/ACT inhaler Inhale 1-2 puffs into the lungs daily as needed for wheezing or shortness of breath.   Unknown   allopurinol  (ZYLOPRIM ) 100 MG tablet Take 100 mg by mouth daily.   05/05/2024 Noon   ALPRAZolam  (XANAX ) 0.5 MG tablet Take 1 tablet (0.5 mg total) by mouth 2 (two) times daily. 10 tablet 0  05/04/2024 Bedtime   CAPLYTA  42 MG capsule Take 42 mg by mouth at bedtime.   05/03/2024   diltiazem  (CARDIZEM  CD) 120 MG 24 hr capsule Take 1 capsule (120 mg total) by mouth daily. 30 capsule 2 05/05/2024 Noon   FARXIGA  10 MG TABS tablet Take 10 mg by mouth daily.   05/05/2024 Noon   ipratropium (ATROVENT ) 0.03 % nasal spray Place 2 sprays into both nostrils 2 (two) times daily as needed for rhinitis.   05/05/2024 Noon   levothyroxine  (SYNTHROID , LEVOTHROID) 50 MCG tablet Take 50 mcg by mouth daily before breakfast.    05/05/2024 Morning   metoprolol  succinate (TOPROL -XL) 50 MG 24 hr tablet Take 50 mg by mouth daily.   05/05/2024 Noon   Multiple Vitamin (MULTIVITAMIN WITH MINERALS) TABS tablet Take 1 tablet by mouth daily.   05/04/2024 Morning   omeprazole  (PRILOSEC) 20 MG capsule Take 20 mg by mouth daily.   05/05/2024 Morning   OXYGEN  Inhale 4 L into the lungs daily.   05/05/2024 Morning   rOPINIRole  (REQUIP ) 3 MG tablet Take 1 tablet (3 mg total) by mouth 2 (two) times daily. 10 tablet 0 05/05/2024 Noon   rosuvastatin  (CRESTOR ) 20 MG tablet TAKE ONE (1) TABLET BY MOUTH EVERY DAY 90 tablet 3 05/05/2024 Noon  torsemide  (DEMADEX ) 100 MG tablet Take 100 mg by mouth daily.   05/05/2024 Noon   warfarin (COUMADIN ) 2 MG tablet Take 1.5-2 tablets (3-4 mg total) by mouth See admin instructions. 4mg  on Tuesday and 3 mg daily the rest of the week. (Patient taking differently: Take 3-4 mg by mouth See admin instructions. Take 3 mg by mouth every day)   05/05/2024 at 11:00 AM    Assessment: 73 yo female history of aortic stenosis s/p mechanical AVR and ascending aneurysm repair, chronic HFpEF and RV failure, CAD with prior CAB, CKD 3, permanent afib, chronic respiratory failure/COPD on 4L [place], admitted with SOB. Pharmacy asked to dose coumadin .  INR 4.4> 4.6> 6.4> 4.9> 3.5>2.5 Hgb 9.8> 9.0, track and trend. Diuresing patient .  Home dose recently changed 7/14 d/t elevated INR 3.3 >>  3mg  daily.  Goal of Therapy:   INR 2-3 Monitor platelets by anticoagulation protocol: Yes   Plan:  Warfarin 3 mg x 1 dose Daily PT-INR Monitor for S/S of bleeding  Elspeth Sour, PharmD Clinical Pharmacist 05/10/2024 7:55 AM

## 2024-05-10 NOTE — Progress Notes (Signed)
 Heart Failure Nurse Navigator Progress Note  PCP: Renato Dorothey HERO, NP PCP-Cardiologist: Jayson Sierras, MD Admission Diagnosis: Acute congestive heart failure, unspecified heart failure type (HCC) AKI (acute kidney injury) (HCC) Acute respiratory failure with hypoxia and hypercapnia [facility]) Admitted from: Home  [facility] patient contacted via telephone to her room in [facility].  Patient preferred for me to review Heart Failure Education with her son [name]) since she could not hear me on the phone.  All Heart failure education reviewed with him and he understood all.   Presentation:   [name] presented with her son, reports since the night before that she had been having significant difficultly breathing.  She was unable to get out of bed secondary to severe dyspnea and weakness.  She is compliant with her home medications.  Per son she seemed more swollen than normal.  Denied fever, cough, chest pain or vomiting.  Patient had also been incontinent of urine in the AM but because she was too weak to get out of bed. BNP 1,203. HHS-Troponin 19. Chest x-ray:Cardiomegaly with diffuse interstitial prominence , likely pulmonary edema. Increasing small to moderate right pleural effusion.   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 Jude mechanical AVR 2007   Asthma    Atrial fibrillation (HCC)    Carcinoid tumor of colon 2007   Chronic diastolic heart failure (HCC)    Coronary atherosclerosis of native coronary artery    Status post CABG 2007   GERD (gastroesophageal reflux disease)    History of colonoscopy 2003   Dr. 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 Shaaron, benign biopsies   RLS (restless legs syndrome)    Schatzki's ring      Social History   Socioeconomic History   Marital status:  Married    Spouse name: Not on file   Number of children: 1   Years of education: stopped in 10th grade   Highest education level: GED or equivalent  Occupational History   Occupation: Retired  Tobacco Use   Smoking status: Former    Current packs/day: 0.00    Average packs/day: 1 pack/day for 20.0 years (20.0 ttl pk-yrs)    Types: Cigarettes    Start date: 10/20/1968    Quit date: 10/20/1988    Years since quitting: 35.5   Smokeless tobacco: Never  Vaping Use   Vaping status: Never Used  Substance and Sexual Activity   Alcohol use: No    Alcohol/week: 0.0 standard drinks of alcohol   Drug use: No   Sexual activity: Yes    Partners: Male    Birth control/protection: None    Comment: spouse  Other Topics Concern   Not on file  Social History Narrative   Not on file   Social Drivers of Health   Financial Resource Strain: Low Risk  (12/27/2023)   Received from [facility]   Overall Financial Resource Strain (CARDIA)    Difficulty of Paying Living Expenses: Not hard at all  Food Insecurity: No Food Insecurity (05/06/2024)   Hunger Vital Sign    Worried About Running Out of Food in the Last Year: Never true    Ran Out of Food in the Last Year: Never true  Transportation Needs: No Transportation Needs (  05/06/2024)   PRAPARE - Administrator, Civil Service (Medical): No    Lack of Transportation (Non-Medical): No  Physical Activity: Inactive (12/27/2023)   Received from [facility]   Exercise Vital Sign    On average, how many days per week do you engage in moderate to strenuous exercise (like a brisk walk)?: 0 days    On average, how many minutes do you engage in exercise at this level?: 0 min  Stress: Stress Concern Present (12/27/2023)   Received from [facility] - Occupational Stress Questionnaire    Feeling of Stress : Rather much  Social Connections: Socially Isolated (05/06/2024)   Social Connection and  Isolation Panel    Frequency of Communication with Friends and Family: Never    Frequency of Social Gatherings with Friends and Family: Never    Attends Religious Services: Never    Database administrator or Organizations: No    Attends Engineer, structural: Never    Marital Status: Married  Water engineer and Provision:  Detailed education and instructions provided on heart failure disease management including the following:  Signs and symptoms of Heart Failure When to call the physician Importance of daily weights Low sodium diet Fluid restriction Medication management Anticipated future follow-up appointments  Patient education given on each of the above topics.  Patient acknowledges understanding via teach back method and acceptance of all instructions.  Education Materials:  Living Better With Heart Failure Booklet, HF zone tool, & Daily Weight Tracker Tool.  Patient has scale at home: Patient going to SNF following discharge today from [facility] Patient has pill box at home: Patient will be provided medications at SNF and does take them.    High Risk Criteria for Readmission and/or Poor Patient Outcomes: Heart failure hospital admissions (last 6 months): 2  No Show rate: 5% Difficult social situation: None Demonstrates medication adherence: Yes Primary Language: English Literacy level: Reading, Writing, Comprehension  Barriers of Care:   Weakness   Considerations/Referrals:   Referral made to Heart Failure Pharmacist Stewardship: Yes Referral made to Heart Failure CSW/NCM TOC: No Referral made to Heart & Vascular TOC clinic: No. Patient's son [name]),  thinks his mom is too weak to go a to a Heart Failure Clinic at this time.  Will be transferred to [facility] in [place] today.  Items for Follow-up on DC/TOC: Diet & Fluid Restrictions Daily Weights Continued Heart Failure Education  [name], RN, BSN [facility] Heart Failure Navigator Secure Chat Only

## 2024-05-10 NOTE — Progress Notes (Signed)
 Physical Therapy Treatment Patient Details Name: [name] MRN: [id number] DOB: [birth year] Today's Date: 05/10/2024   History of Present Illness [name]  is a 73 y.o. female,  with longstanding history of COPD and chronic respiratory failure typically on 4 L/min supplemental oxygen , chronic atrial fibrillation, HTN, HLD, CAD status post 2V CABG with mechanical aortic valve replacement, patient on home oxygen , not on BiPAP or CPAP at home, patient most recent hospitalization was at [facility] for respiratory failure secondary to CHF and COPD.  - She presents today secondary to complaints of dyspnea, continuous and deconditioning, son reports she was unable to get out of bed secondary to difficulty breathing, weakness, has been compliant with her home medication including torsemide , no cough, no fever, no chest pain, no vomiting.  - In ED she was with significant increased work of breathing, respiratory distress, ABG was obtained, showing significant respiratory acidosis with PCO2 of 78, labs significant for worsening creatinine at 2, BNP significantly elevated at 1203, chest x-ray with evidence of volume overload, lactic acid within normal limit, normal white blood cell count, INR elevated at 4.4, patient was started on BiPAP with improvement of her symptoms, [facility] hospitalist consulted to admit    PT Comments  Patient agreeable to PT session. Patient presented with 3 L/min supplemental oxygen . Patient O2 saturation dropped to 86% upon exertion but was able to recover with a brief period of rest. Patient able to walk a limited distance and continues to be limited by fatigue. Patient will benefit from continued skilled physical therapy in hospital and recommended venue below to increase strength, balance, endurance for safe ADLs and gait.     If plan is discharge home, recommend the following: A lot of help with bathing/dressing/bathroom;A lot of help with walking and/or transfers;Assistance  with cooking/housework;Assist for transportation;Help with stairs or ramp for entrance   Can travel by private vehicle     Yes  Equipment Recommendations  None recommended by PT    Recommendations for Other Services       Precautions / Restrictions Precautions Precautions: Fall Recall of Precautions/Restrictions: Intact Restrictions Weight Bearing Restrictions Per Provider Order: No     Mobility  Bed Mobility Overal bed mobility: Needs Assistance Bed Mobility: Supine to Sit     Supine to sit: Min assist, HOB elevated     General bed mobility comments: Single hand held assist to pull to sit.    Transfers Overall transfer level: Needs assistance Equipment used: Rolling walker (2 wheels) Transfers: Sit to/from Stand, Bed to chair/wheelchair/BSC Sit to Stand: Mod assist   Step pivot transfers: Min assist, Mod assist       General transfer comment: EOB to chair transfer with RW. Labored movement; unsteady, shuffling steps    Ambulation/Gait Ambulation/Gait assistance: Mod assist Gait Distance (Feet): 10 Feet Assistive device: Rolling walker (2 wheels) Gait Pattern/deviations: Decreased step length - left, Decreased step length - right, Decreased stride length Gait velocity: decreased     General Gait Details: unsteady, labored movment, increased work of breath, smaller steps   Optometrist     Tilt Bed    Modified Rankin (Stroke Patients Only)       Balance Overall balance assessment: Needs assistance Sitting-balance support: Feet supported, No upper extremity supported Sitting balance-Leahy Scale: Good Sitting balance - Comments: fair to good seated at EOB during exercise   Standing balance support: Bilateral upper extremity supported, During  functional activity, Reliant on assistive device for balance Standing balance-Leahy Scale: Poor Standing balance comment: poor with RW                             Communication Communication Communication: Impaired Factors Affecting Communication: Hearing impaired  Cognition Arousal: Alert Behavior During Therapy: WFL for tasks assessed/performed   PT - Cognitive impairments: No apparent impairments                         Following commands: Intact      Cueing Cueing Techniques: Verbal cues, Tactile cues  Exercises General Exercises - Lower Extremity Ankle Circles/Pumps: AROM, 10 reps Long Arc Quad: AROM, 10 reps Hip Flexion/Marching: AROM, 10 reps Toe Raises: AROM, 10 reps Heel Raises: AROM, 10 reps    General Comments General comments (skin integrity, edema, etc.): wounds present on BIL LE, bandage removed      Pertinent Vitals/Pain Pain Assessment Pain Assessment: No/denies pain    Home Living                          Prior Function            PT Goals (current goals can now be found in the care plan section) Acute Rehab PT Goals Patient Stated Goal: return home after rehab PT Goal Formulation: With patient Time For Goal Achievement: 05/20/24 Potential to Achieve Goals: Good Progress towards PT goals: Progressing toward goals    Frequency    Min 3X/week      PT Plan      Co-evaluation     PT goals addressed during session: Mobility/safety with mobility;Proper use of DME;Balance;Strengthening/ROM        AM-PAC PT 6 Clicks Mobility   Outcome Measure  Help needed turning from your back to your side while in a flat bed without using bedrails?: A Little Help needed moving from lying on your back to sitting on the side of a flat bed without using bedrails?: A Little Help needed moving to and from a bed to a chair (including a wheelchair)?: A Lot Help needed standing up from a chair using your arms (e.g., wheelchair or bedside chair)?: A Lot Help needed to walk in hospital room?: A Lot Help needed climbing 3-5 steps with a railing? : A Lot 6 Click Score: 14    End of Session    Activity Tolerance: Patient limited by fatigue Patient left: in chair Nurse Communication: Mobility status PT Visit Diagnosis: Unsteadiness on feet (R26.81);Other abnormalities of gait and mobility (R26.89);Muscle weakness (generalized) (M62.81)     Time: 9146-9074 PT Time Calculation (min) (ACUTE ONLY): 32 min  Charges:    $Therapeutic Exercise: 8-22 mins $Therapeutic Activity: 8-22 mins PT General Charges $$ ACUTE PT VISIT: 1 Visit                     Seira Cody, SPT

## 2024-05-10 NOTE — Discharge Summary (Addendum)
 Physician Discharge Summary   Patient: [name] MRN: [id number] DOB: 09-03-1951  Admit date:     05/05/2024  Discharge date: 05/10/24  Discharge Physician: Eric Nunnery   PCP: Renato Dorothey HERO, NP   Recommendations at discharge:  Repeat basic metabolic panel to follow electrolytes and renal function Reassess blood pressure and adjust medication as needed Outpatient sleep study recommended to determine necessity of long-term BiPAP usage. Outpatient follow-up with cardiology service as instructed. Repeat CBC to follow hemoglobin trend/stability Repeat INR levels and adjust warfarin dose as needed  Discharge Diagnoses: Principal Problem:   Acute on chronic respiratory failure (HCC) Active Problems:   Hypothyroidism   Hyperlipidemia   Atrial fibrillation, chronic (HCC)   S/P AVR (aortic valve replacement)   COPD with acute exacerbation (HCC)   Atrial fibrillation with RVR (HCC)   Coronary artery disease of bypass graft of native heart with stable angina pectoris (HCC)   Physical deconditioning   CKD (chronic kidney disease) stage 3, GFR 30-59 ml/min (HCC)   Permanent atrial fibrillation (HCC)   Left atrial mass   AKI (acute kidney injury) (HCC)   Coronary artery disease due to lipid rich plaque   Acute heart failure with preserved ejection fraction (HFpEF) (HCC)   Acute respiratory failure with hypoxia and hypercapnia (HCC)  Brief hospital admission narrative: As per H&P written by Dr. Sherlon on 05/05/2024 [name]  is a 73 y.o. female,  with longstanding history of COPD and chronic respiratory failure typically on 4 L/min supplemental oxygen , chronic atrial fibrillation, HTN, HLD, CAD status post 2V CABG with mechanical aortic valve replacement, patient on home oxygen , not on BiPAP or CPAP at home, patient most recent hospitalization was at [facility] for respiratory failure secondary to CHF and COPD. - She presents today secondary to complaints of dyspnea,  continuous and deconditioning, son reports she was unable to get out of bed secondary to difficulty breathing, weakness, has been compliant with her home medication including torsemide , no cough, no fever, no chest pain, no vomiting. - In ED she was with significant increased work of breathing, respiratory distress, ABG was obtained, showing significant respiratory acidosis with PCO2 of 78, labs significant for worsening creatinine at 2, BNP significantly elevated at 1203, chest x-ray with evidence of volume overload, lactic acid within normal limit, normal white blood cell count, INR elevated at 4.4, patient was started on BiPAP with improvement of her symptoms, [facility] hospitalist consulted to admit patient for further evaluation and management.  Assessment and Plan: 1-acute on chronic respiratory failure with hypoxia and hypercapnia - Appears to be secondary to COPD exacerbation along with acute on chronic diastolic heart failure - Excellent response to the use of BiPAP; for now will continue chronic home oxygen  supplementation and pursuit outpatient sleep study to determine the need for chronic use. - Currently roughly 14 pounds lighter since admission.  Red's clip 35-36% at discharge. - Following cardiology service recommendations will resume adjusted dose of diuretics and decrease patient's dose of beta-blocker at discharge.  Farxiga  has been discontinued. - Patient has completed antibiotic therapy throughout hospitalization; continue steroids tapering and also continue bronchodilator management and mucolytic's treatment. - Continue to follow clinical response.   2-acute on chronic kidney injury - Most likely in the setting of cardiorenal syndrome -Continue minimize nephrotoxic agents and avoid the use of contrast and hypotension. - Creatinine currently 1.8; continue to follow renal function trend closely..   3-acute on chronic diastolic heart failure and COPD exacerbation - Treatment as  mentioned above - Continue outpatient follow-up with cardiology service. - Patient's condition stable and compensated at discharge.   4-atrial fibrillation with mechanical aortic valve replacement - INR supratherapeutic; pharmacy on board and assisting with dose management; currently 2.45 - No overt bleeding appreciated - Continue treatment with Cardizem  for rate control - Continue to closely follow patient's Coumadin  level and adjust dosage as needed. - Goal is for potassium above 4 magnesium  above 2 as much as possible.   5-hypothyroidism - Continue Synthroid .   6-hypertension/hyperlipidemia - Continue current antihypertensive agents in the setting of Cardizem  and diuresis - Continue statin.   7-coronary artery disease - Patient reports no chest pain - Continue current medication regimen and outpatient follow-up with cardiology service.   8-anemia of chronic disease - Hemoglobin is stable and at baseline - No overt bleeding appreciated - Continue to follow trend/stability.   9-hypokalemia - In the setting of diuresis - Will provide repletion and continue to follow trend - Continue monitoring telemetry.   10-physical therapy and deconditioning - Evaluated by PT with recommendations for short-term rehab at a skilled nursing facility - Patient will go to skilled nursing facility for rehabilitation and conditioning.  Consultants: Cardiology service. Procedures performed: See below for x-ray report. Disposition: Skilled nursing facility Diet recommendation: Heart healthy/low-sodium diet.  DISCHARGE MEDICATION: Allergies as of 05/10/2024       Reactions   Penicillin G Itching   Penicillins Hives, Itching, Swelling, Rash   Dust Mite Extract Other (See Comments)   Seasonal allergies        Medication List     STOP taking these medications    Farxiga  10 MG Tabs tablet Generic drug: dapagliflozin  propanediol       TAKE these medications    albuterol  108 (90  Base) MCG/ACT inhaler Commonly known as: VENTOLIN  HFA Inhale 1-2 puffs into the lungs daily as needed for wheezing or shortness of breath.   allopurinol  100 MG tablet Commonly known as: ZYLOPRIM  Take 100 mg by mouth daily.   ALPRAZolam  0.5 MG tablet Commonly known as: XANAX  Take 1 tablet (0.5 mg total) by mouth 2 (two) times daily.   Caplyta  42 MG capsule Generic drug: lumateperone  tosylate Take 42 mg by mouth at bedtime.   diltiazem  120 MG 24 hr capsule Commonly known as: CARDIZEM  CD Take 1 capsule (120 mg total) by mouth daily.   ipratropium 0.03 % nasal spray Commonly known as: ATROVENT  Place 2 sprays into both nostrils 2 (two) times daily as needed for rhinitis.   levothyroxine  50 MCG tablet Commonly known as: SYNTHROID  Take 50 mcg by mouth daily before breakfast.   metoprolol  succinate 25 MG 24 hr tablet Commonly known as: TOPROL -XL Take 1 tablet (25 mg total) by mouth daily. What changed:  medication strength how much to take   multivitamin with minerals Tabs tablet Take 1 tablet by mouth daily.   omeprazole  20 MG capsule Commonly known as: PRILOSEC Take 20 mg by mouth daily.   OXYGEN  Inhale 4 L into the lungs daily.   polyethylene glycol 17 g packet Commonly known as: MIRALAX  / GLYCOLAX  Take 17 g by mouth daily. Start taking on: May 11, 2024   predniSONE  20 MG tablet Commonly known as: DELTASONE  Take 2 tablets by mouth daily x 2 days; then 1 tablet by mouth daily x 3 days; then half tablet by mouth daily x 3 days and stop prednisone .   rOPINIRole  3 MG tablet Commonly known as: REQUIP  Take 1 tablet (3 mg total) by  mouth 2 (two) times daily.   rosuvastatin  20 MG tablet Commonly known as: CRESTOR  TAKE ONE (1) TABLET BY MOUTH EVERY DAY   Torsemide  60 MG Tabs Take 60 mg by mouth 2 (two) times daily. What changed:  medication strength how much to take when to take this   warfarin 2 MG tablet Commonly known as: COUMADIN  Take as directed. If you  are unsure how to take this medication, talk to your nurse or doctor. Original instructions: Take 1.5-2 tablets (3-4 mg total) by mouth See admin instructions. 4mg  on Tuesday and 3 mg daily the rest of the week. What changed: additional instructions               Discharge Care Instructions  (From admission, onward)           Start     Ordered   05/10/24 0000  Discharge wound care:       Comments: Wound care  Daily      Comments: Cleanse nipple with NS, apply Vaseline gauze (Lawson #[phone]) to area daily and cover with ABD pad or silicone foam whichever secures area better.  If using an ABD pad can try tape or mesh underwear with crotch cut out to make a tube top and attempt to hold dressing  Wound care  2 times daily      Comments: Cleanse legs with soap and water , apply Eucerin cream 2 times a day.  If open draining areas develop may apply Xeroform gauze ([employer]son [phone]) and secure with Kerlix roll gauze   05/10/24 1222            Contact information for follow-up providers     Renato Dorothey HERO, NP. Schedule an appointment as soon as possible for a visit in 10 day(s).   Specialty: Internal Medicine Why: Arrange outpatient follow-up with PCP in 10 days after discharge from a skilled nursing facility. Contact information: [address] [place] [address] [phone]              Contact information for after-discharge care     Destination     [facility] .   Service: Skilled Nursing Contact information: [address] [facility] [place]  [address] [phone]                    Discharge Exam: Filed Weights   05/08/24 0620 05/09/24 0525 05/10/24 0456  Weight: 65.1 kg 62.1 kg 61 kg    General exam: Alert, awake, following commands appropriately and reporting feeling better.  Participating with physical therapy at time of examinations on today's visit. Respiratory system: No using accessory muscle.  Good saturation on  4 L supplementation.  Patient reports no orthopnea. Cardiovascular system: Rate controlled; no rubs, no gallops, no JVD. Gastrointestinal system: Abdomen is nondistended, soft and nontender. No organomegaly or masses felt. Normal bowel sounds heard. Central nervous system: Generally weak.  Moving 4 limbs spontaneously.  No focal neurological deficits. Extremities: No cyanosis or clubbing. Skin: No petechiae.  Left nipple wound and chronic stasis dermatitis changes on both legs present at time of admission.  Continue wound care services recommendations for future management. Psychiatry: Flat affect on exam.  Condition at discharge: Stable and improved.  The results of significant diagnostics from this hospitalization (including imaging, microbiology, ancillary and laboratory) are listed below for reference.   Imaging Studies: DG Chest Port 1 View Result Date: 05/05/2024 CLINICAL DATA:  Difficulty breathing EXAM: PORTABLE CHEST 1  VIEW COMPARISON:  Chest x-ray 09/21/2023 FINDINGS: The cardiac silhouette is enlarged, unchanged. Diffuse interstitial prominence persists. Small to moderate right pleural effusion has increased. No pneumothorax or acute fracture identified. Sternotomy wires and prosthetic heart valve again noted. IMPRESSION: 1. Cardiomegaly with diffuse interstitial prominence, likely pulmonary edema. 2. Increasing small to moderate right pleural effusion. Electronically Signed   By: Greig Pique M.D.   On: 05/05/2024 17:34   ECHOCARDIOGRAM COMPLETE Result Date: 04/29/2024    ECHOCARDIOGRAM REPORT   Patient Name:   ALYSSE RATHE Date of Exam: 04/28/2024 Medical Rec #:  [id number]       Height:       68.0 in Accession #:    [id number]      Weight:       139.6 lb Date of Birth:  Jan 13, 1951        BSA:          1.754 m Patient Age:    73 years        BP:           122/84 mmHg Patient Gender: F               HR:           65 bpm. Exam Location:  [facility] Procedure: 2D Echo, Cardiac Doppler and Color  Doppler (Both Spectral and Color            Flow Doppler were utilized during procedure). Indications:    I27.20 Pulmonary Hypertension  History:        Patient has prior history of Echocardiogram examinations, most                 recent 09/10/2023. CHF, Aortic Valve Disease, Arrythmias:Atrial                 Fibrillation; Risk Factors:Former Smoker, Dyslipidemia and                 Hypertension.                 Aortic Valve: 25 mm St. Jude mechanical valve is present in the                 aortic position. Procedure Date: 2007.  Sonographer:    Bascom Burows RCS, RVS Referring Phys: [id number] [facility]  Sonographer Comments: Technically difficult study due to poor echo windows. Image acquisition challenging due to respiratory motion and Patient sitting up in bed, SOB-on oxygen . IMPRESSIONS  1. Left ventricular ejection fraction, by estimation, is 60 to 65%. The left ventricle has normal function. The left ventricle has no regional wall motion abnormalities. There is mild left ventricular hypertrophy. Left ventricular diastolic parameters are indeterminate. There is the interventricular septum is flattened in systole and diastole, consistent with right ventricular pressure and volume overload.  2. Right ventricular systolic function was not well visualized. The right ventricular size is not well visualized. There is moderately elevated pulmonary artery systolic pressure.  3. 3.4 x 2.4 cm round density adjacent to the interatrial septum within the left atrium, limited visualization. Consider TEE or cardiac CT for further evaluation. . Left atrial size was severely dilated.  4. Right atrial size was severely dilated.  5. The mitral valve is abnormal. Mild mitral valve regurgitation. No evidence of mitral stenosis. Moderate mitral annular calcification.  6. The tricuspid valve is abnormal. Tricuspid valve regurgitation is moderate to severe.  7. There is a 25 mm St. Jude mechanical valve present in  the aortic  position.     . The aortic valve has been repaired/replaced. Aortic valve regurgitation is not visualized. No aortic stenosis is present. There is a 25 mm St. Jude mechanical valve present in the aortic position. Procedure Date: 2007. Aortic valve mean gradient measures 5.0 mmHg.  8. The inferior vena cava is dilated in size with >50% respiratory variability, suggesting right atrial pressure of 8 mmHg. Comparison(s): A prior study was performed on 09/10/2023. EF 65-70%. Interventricular septum is flattened in systole and diastole, consistent with right ventricular pressure and volume overload. Right ventricular size is mildly enlarged and systolic function mildly reduced. Large rounded echodensity within region of the left atrium. Similar finding on previous echo. Severe bi-atrial enlargement. Moderate mitral valve regurgitation. 25 mm mechancial St. Jude aortic valve noted-men pg 5.0 mmHg. FINDINGS  Left Ventricle: Left ventricular ejection fraction, by estimation, is 60 to 65%. The left ventricle has normal function. The left ventricle has no regional wall motion abnormalities. The left ventricular internal cavity size was normal in size. There is  mild left ventricular hypertrophy. The interventricular septum is flattened in systole and diastole, consistent with right ventricular pressure and volume overload. Left ventricular diastolic parameters are indeterminate. Right Ventricle: RV not well visualized. Grossly appears enlarged with decreased systolic function. The right ventricular size is not well visualized. Right vetricular wall thickness was not well visualized. Right ventricular systolic function was not well visualized. There is moderately elevated pulmonary artery systolic pressure. The tricuspid regurgitant velocity is 3.54 m/s, and with an assumed right atrial pressure of 8 mmHg, the estimated right ventricular systolic pressure is 58.1 mmHg. Left Atrium: 3.4 x 2.4 cm round density adjacent to the  interatrial septum within the left atrium, limited visualization. Consider TEE or cardiac CT for further evaluation. Left atrial size was severely dilated. Right Atrium: Right atrial size was severely dilated. Pericardium: There is no evidence of pericardial effusion. Mitral Valve: Eccentric posterior mitral regurigtaiton, difficult to quantify due to eccentricity. The mitral valve is abnormal. There is moderate thickening of the mitral valve leaflet(s). There is moderate calcification of the mitral valve leaflet(s). Moderate mitral annular calcification. Mild mitral valve regurgitation. No evidence of mitral valve stenosis. MV peak gradient, 14.0 mmHg. The mean mitral valve gradient is 3.0 mmHg. Tricuspid Valve: The tricuspid valve is abnormal. Tricuspid valve regurgitation is moderate to severe. No evidence of tricuspid stenosis. Aortic Valve: There is a 25 mm St. Jude mechanical valve present in the aortic position. The aortic valve has been repaired/replaced. Aortic valve regurgitation is not visualized. No aortic stenosis is present. Aortic valve mean gradient measures 5.0 mmHg. Aortic valve peak gradient measures 9.6 mmHg. Aortic valve area, by VTI measures 1.92 cm. There is a 25 mm St. Jude mechanical valve present in the aortic position. Procedure Date: 2007. Pulmonic Valve: The pulmonic valve was not well visualized. Pulmonic valve regurgitation is mild. No evidence of pulmonic stenosis. Aorta: The aortic root and ascending aorta are structurally normal, with no evidence of dilitation. Venous: The inferior vena cava is dilated in size with greater than 50% respiratory variability, suggesting right atrial pressure of 8 mmHg. IAS/Shunts: The interatrial septum was not well visualized.  LEFT VENTRICLE PLAX 2D LVIDd:         4.50 cm LVIDs:         3.30 cm LV PW:         1.20 cm LV IVS:        1.20 cm LVOT diam:  2.00 cm LV SV:         55 LV SV Index:   31 LVOT Area:     3.14 cm  LV Volumes (MOD) LV vol d,  MOD A2C: 83.7 ml LV vol d, MOD A4C: 60.4 ml LV vol s, MOD A2C: 18.1 ml LV vol s, MOD A4C: 16.2 ml LV SV MOD A2C:     65.6 ml LV SV MOD A4C:     60.4 ml LV SV MOD BP:      57.4 ml RIGHT VENTRICLE            IVC RV Basal diam:  5.90 cm    IVC diam: 3.00 cm RV Mid diam:    5.00 cm RV S prime:     8.67 cm/s TAPSE (M-mode): 1.5 cm LEFT ATRIUM              Index         RIGHT ATRIUM           Index LA diam:        8.60 cm  4.90 cm/m    RA Area:     29.40 cm LA Vol (A2C):   201.0 ml 114.59 ml/m  RA Volume:   99.40 ml  56.67 ml/m LA Vol (A4C):   187.0 ml 106.61 ml/m LA Biplane Vol: 200.0 ml 114.02 ml/m  AORTIC VALVE                     PULMONIC VALVE AV Area (Vmax):    2.07 cm      PV Vmax:          0.98 m/s AV Area (Vmean):   2.12 cm      PV Peak grad:     3.9 mmHg AV Area (VTI):     1.92 cm      PR End Diast Vel: 13.18 msec AV Vmax:           155.00 cm/s AV Vmean:          105.000 cm/s AV VTI:            0.285 m AV Peak Grad:      9.6 mmHg AV Mean Grad:      5.0 mmHg LVOT Vmax:         102.00 cm/s LVOT Vmean:        70.900 cm/s LVOT VTI:          0.174 m LVOT/AV VTI ratio: 0.61  AORTA Ao Root diam: 2.40 cm Ao Asc diam:  3.50 cm MITRAL VALVE             TRICUSPID VALVE MV Area VTI:  1.28 cm   TR Peak grad:   50.1 mmHg MV Peak grad: 14.0 mmHg  TR Vmax:        354.00 cm/s MV Mean grad: 3.0 mmHg MV Vmax:      1.87 m/s   SHUNTS MV Vmean:     75.2 cm/s  Systemic VTI:  0.17 m                          Systemic Diam: 2.00 cm Dorn Ross MD Electronically signed by Dorn Ross MD Signature Date/Time: 04/29/2024/3:19:25 PM    Final     Microbiology: Results for orders placed or performed during the hospital encounter of 05/05/24  Resp panel by RT-PCR (RSV, Flu A&B, Covid) Anterior Nasal Swab  Status: None   Collection Time: 05/05/24  6:58 PM   Specimen: Anterior Nasal Swab  Result Value Ref Range Status   SARS Coronavirus 2 by RT PCR NEGATIVE NEGATIVE Final    Comment: (NOTE) SARS-CoV-2 target nucleic  acids are NOT DETECTED.  The SARS-CoV-2 RNA is generally detectable in upper respiratory specimens during the acute phase of infection. The lowest concentration of SARS-CoV-2 viral copies this assay can detect is 138 copies/mL. A negative result does not preclude SARS-Cov-2 infection and should not be used as the sole basis for treatment or other patient management decisions. A negative result may occur with  improper specimen collection/handling, submission of specimen other than nasopharyngeal swab, presence of viral mutation(s) within the areas targeted by this assay, and inadequate number of viral copies(<138 copies/mL). A negative result must be combined with clinical observations, patient history, and epidemiological information. The expected result is Negative.  Fact Sheet for Patients:  BloggerCourse.com  Fact Sheet for Healthcare Providers:  SeriousBroker.it  This test is no t yet approved or cleared by the [place]  FDA and  has been authorized for detection and/or diagnosis of SARS-CoV-2 by FDA under an Emergency Use Authorization (EUA). This EUA will remain  in effect (meaning this test can be used) for the duration of the COVID-19 declaration under Section 564(b)(1) of the Act, 21 U.S.C.section 360bbb-3(b)(1), unless the authorization is terminated  or revoked sooner.       Influenza A by PCR NEGATIVE NEGATIVE Final   Influenza B by PCR NEGATIVE NEGATIVE Final    Comment: (NOTE) The Xpert Xpress SARS-CoV-2/FLU/RSV plus assay is intended as an aid in the diagnosis of influenza from Nasopharyngeal swab specimens and should not be used as a sole basis for treatment. Nasal washings and aspirates are unacceptable for Xpert Xpress SARS-CoV-2/FLU/RSV testing.  Fact Sheet for Patients: BloggerCourse.com  Fact Sheet for Healthcare Providers: SeriousBroker.it  This  test is not yet approved or cleared by the [place]  FDA and has been authorized for detection and/or diagnosis of SARS-CoV-2 by FDA under an Emergency Use Authorization (EUA). This EUA will remain in effect (meaning this test can be used) for the duration of the COVID-19 declaration under Section 564(b)(1) of the Act, 21 U.S.C. section 360bbb-3(b)(1), unless the authorization is terminated or revoked.     Resp Syncytial Virus by PCR NEGATIVE NEGATIVE Final    Comment: (NOTE) Fact Sheet for Patients: BloggerCourse.com  Fact Sheet for Healthcare Providers: SeriousBroker.it  This test is not yet approved or cleared by the [place]  FDA and has been authorized for detection and/or diagnosis of SARS-CoV-2 by FDA under an Emergency Use Authorization (EUA). This EUA will remain in effect (meaning this test can be used) for the duration of the COVID-19 declaration under Section 564(b)(1) of the Act, 21 U.S.C. section 360bbb-3(b)(1), unless the authorization is terminated or revoked.  Performed at [facility], [address], [place], [place] [address]   MRSA Next Gen by PCR, Nasal     Status: None   Collection Time: 05/05/24 11:00 PM   Specimen: Nasal Mucosa; Nasal Swab  Result Value Ref Range Status   MRSA by PCR Next Gen NOT DETECTED NOT DETECTED Final    Comment: (NOTE) The GeneXpert MRSA Assay (FDA approved for NASAL specimens only), is one component of a comprehensive MRSA colonization surveillance program. It is not intended to diagnose MRSA infection nor to guide or monitor treatment for MRSA infections. Test performance is not FDA approved in patients less than  60 years old. Performed at [facility] ([facility] [address] [place], [place] [address]     Labs: CBC: Recent Labs  Lab 05/05/24 1700 05/06/24 0424 05/10/24 0417  WBC 6.8 5.6 7.1  NEUTROABS 5.1  --   --   HGB 9.8* 9.2* 9.0*  HCT 32.5* 31.9* 29.6*  MCV  91.8 91.9 87.6  PLT 199 156 195   Basic Metabolic Panel: Recent Labs  Lab 05/06/24 0424 05/07/24 0852 05/07/24 1642 05/08/24 0502 05/09/24 0215 05/10/24 0417  NA 140 138  --  136 136 139  K 3.5 2.7* 3.2* 2.8* 3.5 3.5  CL 99 91*  --  94* 88* 86*  CO2 28 31  --  33* 36* 43*  GLUCOSE 135* 187*  --  135* 139* 133*  BUN 27* 40*  --  44* 48* 48*  CREATININE 2.18* 2.38*  --  2.23* 2.15* 1.87*  CALCIUM  8.3* 8.5*  --  8.5* 8.8* 8.7*  MG  --   --   --   --  2.4  --    Liver Function Tests: Recent Labs  Lab 05/05/24 1700  AST 26  ALT 14  ALKPHOS 77  BILITOT 0.8  PROT 7.4  ALBUMIN 3.7   CBG: No results for input(s): GLUCAP in the last 168 hours.  Discharge time spent:  38 minutes.  Signed: Eric Nunnery, MD [facility] 05/10/2024

## 2024-05-10 NOTE — Progress Notes (Signed)
 Nurse to nurse report called to [facility].

## 2024-05-10 NOTE — Progress Notes (Signed)
 Patient discharged to [facility] via [facility]. Patient and family satisfied that all belongings were returned. Discharge paperwork reviewed with the patient and all questions answered. IV removed and site intact. [facility] nurse called and informed that the patient is on the way.

## 2024-05-10 NOTE — Progress Notes (Signed)
   05/10/24 0700  ReDS Vest / Clip  Station Marker C  Ruler Value 35  ReDS Value Range 36 - 40  ReDS Actual Value 39  Anatomical Comments patient sitting up in bed

## 2024-05-23 ENCOUNTER — Encounter

## 2024-05-27 ENCOUNTER — Encounter: Payer: Self-pay | Admitting: Nurse Practitioner

## 2024-05-27 ENCOUNTER — Ambulatory Visit: Attending: Nurse Practitioner | Admitting: Nurse Practitioner

## 2024-05-27 VITALS — BP 88/60 | HR 74 | Ht 68.0 in | Wt 135.0 lb

## 2024-05-27 DIAGNOSIS — Z952 Presence of prosthetic heart valve: Secondary | ICD-10-CM

## 2024-05-27 DIAGNOSIS — I959 Hypotension, unspecified: Secondary | ICD-10-CM | POA: Diagnosis not present

## 2024-05-27 DIAGNOSIS — R931 Abnormal findings on diagnostic imaging of heart and coronary circulation: Secondary | ICD-10-CM

## 2024-05-27 DIAGNOSIS — E876 Hypokalemia: Secondary | ICD-10-CM

## 2024-05-27 DIAGNOSIS — I5032 Chronic diastolic (congestive) heart failure: Secondary | ICD-10-CM

## 2024-05-27 DIAGNOSIS — Z8679 Personal history of other diseases of the circulatory system: Secondary | ICD-10-CM

## 2024-05-27 DIAGNOSIS — J449 Chronic obstructive pulmonary disease, unspecified: Secondary | ICD-10-CM

## 2024-05-27 DIAGNOSIS — I2510 Atherosclerotic heart disease of native coronary artery without angina pectoris: Secondary | ICD-10-CM | POA: Diagnosis not present

## 2024-05-27 DIAGNOSIS — I272 Pulmonary hypertension, unspecified: Secondary | ICD-10-CM

## 2024-05-27 DIAGNOSIS — I38 Endocarditis, valve unspecified: Secondary | ICD-10-CM

## 2024-05-27 DIAGNOSIS — Z79899 Other long term (current) drug therapy: Secondary | ICD-10-CM

## 2024-05-27 DIAGNOSIS — N1832 Chronic kidney disease, stage 3b: Secondary | ICD-10-CM

## 2024-05-27 DIAGNOSIS — I4821 Permanent atrial fibrillation: Secondary | ICD-10-CM

## 2024-05-27 DIAGNOSIS — Z9889 Other specified postprocedural states: Secondary | ICD-10-CM

## 2024-05-27 MED ORDER — POTASSIUM CHLORIDE CRYS ER 20 MEQ PO TBCR: 40.0000 meq | EXTENDED_RELEASE_TABLET | Freq: Every day | ORAL | 2 refills | Status: DC

## 2024-05-27 NOTE — Progress Notes (Signed)
 Cardiology Office Note:  .   Date:  05/27/2024 ID:  [name], DOB 16-Apr-1951, MRN [id number] PCP: Renato Dorothey HERO, NP  [facility] Providers Cardiologist:  Jayson Sierras, MD    History of Present Illness: .   NOLIA TSCHANTZ is a 73 y.o. female with a PMH of aortic valve stenosis, s/p mechanical AVR in 2007 with 38 mm Hemashield tube graft repair of ascending aortic aneurysm, HFpEF, CAD, s/p CABG in 2007, permanent A-fib, possible left atrial mass versus artifact, CKD stage IIIb, and COPD, who presents today for hospital follow-up.  Last seen by Dr. Sierras on October 08, 2023.  She had been recently discharged from hospital with acute on chronic hypoxic/hypercarbic respiratory failure, felt to be multifactorial in setting of HFpEF and COPD exacerbation.  See echocardiogram report noted below.  Was currently undergoing rehab at [facility] nursing center.  Still noting weakness, denied any progressive leg edema.  Weight was down.  Was felt she would not be a good candidate for TEE given chronic respiratory status.  Recommend to possibly consider outpatient cardiac CT once patient was out of rehab and was feeling better at home.  Hospital admission in March 2025 for acute on chronic respiratory failure with hypoxia and hypercapnia, started on IV Lasix .  Workup revealed chest x-ray with small bilateral pleural effusions, associated bibasilar dependent compressive atelectasis, superimposed pneumonia not entirely excluded.  Noted to have cardiomegaly with moderate bilateral interstitial and alveolar pulmonary edema, likely CHF exacerbation.  proBNP 17,489.  Was on BiPAP during hospital stay.  Was referred to [facility] rehab for SNF placement.  Did well at SNF receiving OT/PT, PT and OT was arranged outpatient.  02/26/2024 - Today she presents for hospital follow-up.  She states she is doing much better since leaving the hospital. Now she is off bisoprolol  and now on metoprolol , tolerating  well. She is on 3.5 L of continuous oxygen . Tells me she has broken 4 ribs on left side, however I do not see recent imaging results to confirm this. Denies any chest pain, shortness of breath, palpitations, syncope, presyncope, dizziness, orthopnea, PND, swelling or significant weight changes, acute bleeding, or claudication. Had left knee operated on last fall.   Since I have last seen patient, she was hospitalized July 2025 and presented with dyspnea, deconditioning, and was admitted d/t acute on chronic respiratory failure with hypoxia and hypercapnia that was secondary to COPD exacerbation along with acute on chronic diastolic HF. Was d/c to SNF.   Today she presents for follow-up. She is here with her son. Son says she just got out of rehab today and [facility] SNF. Tells me she has been having low BP, overall asymptomatic. Overall doing well. Denies any chest pain, shortness of breath, palpitations, syncope, presyncope, dizziness, orthopnea, PND, swelling or significant weight changes, acute bleeding, or claudication. Son says she is about to start OP PT.   ROS: Negative. See HPI.   Studies Reviewed: SABRA    EKG: EKG is not ordered today.   Echo 04/2024:  1. Left ventricular ejection fraction, by estimation, is 60 to 65%. The  left ventricle has normal function. The left ventricle has no regional  wall motion abnormalities. There is mild left ventricular hypertrophy.  Left ventricular diastolic parameters  are indeterminate. There is the interventricular septum is flattened in  systole and diastole, consistent with right ventricular pressure and  volume overload.   2. Right ventricular systolic function was not well visualized. The right  ventricular  size is not well visualized. There is moderately elevated  pulmonary artery systolic pressure.   3. 3.4 x 2.4 cm round density adjacent to the interatrial septum within  the left atrium, limited visualization. Consider TEE or cardiac CT for  further  evaluation. . Left atrial size was severely dilated.   4. Right atrial size was severely dilated.   5. The mitral valve is abnormal. Mild mitral valve regurgitation. No  evidence of mitral stenosis. Moderate mitral annular calcification.   6. The tricuspid valve is abnormal. Tricuspid valve regurgitation is  moderate to severe.   7. There is a 25 mm St. Jude mechanical valve present in the aortic  position.     . The aortic valve has been repaired/replaced. Aortic valve  regurgitation is not visualized. No aortic stenosis is present. There is a  25 mm St. Jude mechanical valve present in the aortic position. Procedure  Date: 2007. Aortic valve mean gradient  measures 5.0 mmHg.   8. The inferior vena cava is dilated in size with >50% respiratory  variability, suggesting right atrial pressure of 8 mmHg.   Comparison(s): A prior study was performed on 09/10/2023. EF 65-70%.  Interventricular septum is flattened in systole and diastole, consistent  with right ventricular pressure and volume overload. Right ventricular  size is mildly enlarged and systolic  function mildly reduced. Large rounded echodensity within region of the  left atrium. Similar finding on previous echo. Severe bi-atrial  enlargement. Moderate mitral valve regurgitation. 25 mm mechancial St.  Jude aortic valve noted-men pg 5.0 mmHg.   Echo 08/2023:  1. Left ventricular ejection fraction, by estimation, is 65 to 70%. The  left ventricle has normal function. The left ventricle has no regional  wall motion abnormalities. There is mild left ventricular hypertrophy.  Left ventricular diastolic parameters  are indeterminate. There is the interventricular septum is flattened in  systole and diastole, consistent with right ventricular pressure and  volume overload.   2. Right ventricular systolic function is mildly reduced. The right  ventricular size is mildly enlarged. There is mildly elevated pulmonary  artery systolic  pressure. The estimated right ventricular systolic  pressure is 43.5 mmHg.   3. Left atrial size was severely dilated.   4. Large, rounded echodensity (approximately 3.5 x 3.5 cm) noted adjacent  to atrial septum within region of left atrium - best seen in subcostal  view and to less degree in apical two chamber view. Similar finding noted  on prior study although somewhat  obscured by acoustic shadowing from AVR. Concern would be to exclude  atrial myxoma although could be off axis imaging of atrial wall. Mass not  described on standard chest CT. Consider TEE or cardiac CTA for further  evaluation if clinically stable.   5. Right atrial size was severely dilated.   6. The mitral valve is degenerative. Moderate mitral valve regurgitation.  Moderate mitral annular calcification.   7. Tricuspid valve regurgitation is moderate.   8. The aortic valve has been repaired/replaced. Aortic valve  regurgitation is not visualized. There is a 25 mm St. Jude mechanical  valve present in the aortic position. Aortic valve mean gradient measures  5.0 mmHg.   9. The inferior vena cava is dilated in size with <50% respiratory  variability, suggesting right atrial pressure of 15 mmHg.   Comparison(s): Prior images reviewed side by side.   Physical Exam:   VS:  BP (!) 87/60   Pulse 74   Ht 5' 8 (1.727  m)   Wt 135 lb (61.2 kg)   SpO2 95% Comment: 4 liters oxyegen via [facility]  BMI 20.53 kg/m    Wt Readings from Last 3 Encounters:  05/27/24 135 lb (61.2 kg)  05/10/24 134 lb 7.7 oz (61 kg)  02/26/24 139 lb 9.6 oz (63.3 kg)    GEN: Thin, frail 73 y.o. female in no acute distress NECK: No JVD; No carotid bruits CARDIAC: S1/S2, RRR, no murmurs, rubs, gallops RESPIRATORY:  Clear to auscultation without rales, wheezing or rhonchi  ABDOMEN: Soft, non-tender, non-distended EXTREMITIES:  No edema; No deformity   ASSESSMENT AND PLAN: .    Hypotension BP on arrival 88/58, repeat BP 87/60. Overall  asymptomatic. Will stop metoprolol . Will bring her back in 1 week for repeat BP check. Will continue rest of medication regimen, may need to slowly adjust medication over time/ consider midodrine in the future. Discussed to monitor BP at home at least 2 hours after medications and sitting for 5-10 minutes. Care and ED precautions discussed.   HFpEF Stage C, NYHA class I-III symptoms.  EF 60-65% in July 2025.  See most recent Echo report noted above. Euvolemic well compensated on exam.  Stopping Metoprolol  d/t BP trends. GDMT limited at this time. No other medication changes at this time.  Low sodium diet, fluid restriction <2L, and daily weights encouraged. Educated to contact our office for weight gain of 2 lbs overnight or 5 lbs in one week.   CAD, s/p CABG Stable with no anginal symptoms. No indication for ischemic evaluation.  Not on ASA d/t being on Coumadin . Stopping Metoprolol .  Continue rosuvastatin . Heart healthy diet and regular cardiovascular exercise encouraged.   Aortic valve stenosis, s/p mechanical AVR in 2007 with 38 mm Hemashield tube graft repair of ascending aortic aneurysm Normal prosthetic valve function seen on most recent echo. Will continue to monitor with regular surveillance.   Permanent A-fib Denies any tachycardia or palpitations. Stopping Metoprolol . Continue rest of medication regimen.  Continue follow-up with Coumadin  clinic.  PCP is managing INR checks.   Abnormal Echo, valvular insufficiency Most recent echo 04/2024 revealed a 3.4 x 2.4 cm round density adjacent to the interatrial septum within the left atrium, limited visualization. TR moderate to severe. Not a good surgical candidate. Mild MR. Reviewed by Dr. Shlomo during most recent hospitalization and recommended that pt would not be an ideal candidate for TEE given her respiratory status. Stated could consider coronary CTA given her renal function and respiratory status, but was not a candidate at the time during  hospitalization due to her kidney function. Did discuss and review with pt, will continue to monitor at this time.   CKD stage 3b Most recent kidney function on file showed sCr of 1.71 with eGFR 32. Avoid nephrotoxic agents. No med changes at this time besides what is noted above. Will recheck BMET. Continue to follow with PCP.  8. Hypokalemia Most recent K+ level was 3.0. Will start potassium 40 mEq daily and recheck BMET in 1-2 weeks.   9. COPD, pulmonary HTN Breathing is stable. Echo 04/2024 revealed moderately elevated PASP. WHO group 2/3. Continue to f/u with PCP.    Dispo: Follow-up with me/APP in 4-6 weeks or sooner if anything changes.   Signed, Almarie Crate, NP

## 2024-05-27 NOTE — Patient Instructions (Addendum)
 Medication Instructions:  STOP Metoprolol    START Potassium 40 meq daily  Labwork:  BMET in 1 week at [employer]  Testing/Procedures: None today  Follow-Up: 1 week with Nurse for BP check  4-6 weeks with [name]  Any Other Special Instructions Will Be Listed Below (If Applicable).  If you need a refill on your cardiac medications before your next appointment, please call your pharmacy.

## 2024-06-03 ENCOUNTER — Ambulatory Visit: Attending: Nurse Practitioner

## 2024-06-03 DIAGNOSIS — I959 Hypotension, unspecified: Secondary | ICD-10-CM | POA: Diagnosis not present

## 2024-06-03 NOTE — Progress Notes (Signed)
 Patient here for BP check per peck  Patient presents well just as she states  She denies an SOB, CP, Dizziness, palpitations or fatigue Patient states she feels much better after stopping metoprolol   Patient vitals today were  122/76 HR 97  SPO2 94 on 4 L of O2  Routed to provider for review

## 2024-06-29 ENCOUNTER — Ambulatory Visit: Attending: Cardiology | Admitting: *Deleted

## 2024-06-29 DIAGNOSIS — I482 Chronic atrial fibrillation, unspecified: Secondary | ICD-10-CM | POA: Diagnosis not present

## 2024-06-29 DIAGNOSIS — Z5181 Encounter for therapeutic drug level monitoring: Secondary | ICD-10-CM

## 2024-06-29 DIAGNOSIS — Z952 Presence of prosthetic heart valve: Secondary | ICD-10-CM | POA: Diagnosis not present

## 2024-06-29 LAB — POCT INR: INR: 1.7 — AB (ref 2.0–3.0)

## 2024-06-29 NOTE — Progress Notes (Signed)
 INR 1.7; Please see anticoagulation encounter

## 2024-06-29 NOTE — Patient Instructions (Signed)
 Take warfarin 2 tablets tonight and tomorrow night then increase to 1 1/2 tablets daily  Pt has been taking 1 1/2 tabs daily except 1/2 on Tuesdays Recheck in 3 wk

## 2024-07-04 ENCOUNTER — Encounter: Payer: Self-pay | Admitting: Nurse Practitioner

## 2024-07-04 ENCOUNTER — Ambulatory Visit: Attending: Nurse Practitioner | Admitting: Nurse Practitioner

## 2024-07-04 VITALS — BP 158/74 | HR 62 | Ht 68.0 in | Wt 147.2 lb

## 2024-07-04 DIAGNOSIS — I5032 Chronic diastolic (congestive) heart failure: Secondary | ICD-10-CM | POA: Diagnosis not present

## 2024-07-04 DIAGNOSIS — I2510 Atherosclerotic heart disease of native coronary artery without angina pectoris: Secondary | ICD-10-CM

## 2024-07-04 DIAGNOSIS — Z9889 Other specified postprocedural states: Secondary | ICD-10-CM

## 2024-07-04 DIAGNOSIS — N1832 Chronic kidney disease, stage 3b: Secondary | ICD-10-CM

## 2024-07-04 DIAGNOSIS — J449 Chronic obstructive pulmonary disease, unspecified: Secondary | ICD-10-CM

## 2024-07-04 DIAGNOSIS — I38 Endocarditis, valve unspecified: Secondary | ICD-10-CM

## 2024-07-04 DIAGNOSIS — R931 Abnormal findings on diagnostic imaging of heart and coronary circulation: Secondary | ICD-10-CM

## 2024-07-04 DIAGNOSIS — I272 Pulmonary hypertension, unspecified: Secondary | ICD-10-CM

## 2024-07-04 DIAGNOSIS — Z952 Presence of prosthetic heart valve: Secondary | ICD-10-CM

## 2024-07-04 DIAGNOSIS — I4821 Permanent atrial fibrillation: Secondary | ICD-10-CM

## 2024-07-04 DIAGNOSIS — Z8679 Personal history of other diseases of the circulatory system: Secondary | ICD-10-CM

## 2024-07-04 NOTE — Progress Notes (Unsigned)
 Cardiology Office Note:  .   Date:  07/04/2024 ID:  [name], DOB February 01, 1951, MRN [id number] PCP: Renato Dorothey HERO, NP  [facility] Providers Cardiologist:  Jayson Sierras, MD    History of Present Illness: .   [name] is a 73 y.o. female with a PMH of aortic valve stenosis, s/p mechanical AVR in 2007 with 38 mm Hemashield tube graft repair of ascending aortic aneurysm, HFpEF, CAD, s/p CABG in 2007, permanent A-fib, possible left atrial mass versus artifact, CKD stage IIIb, and COPD, who presents today for hospital follow-up.  Last seen by Dr. Sierras on October 08, 2023.  She had been recently discharged from hospital with acute on chronic hypoxic/hypercarbic respiratory failure, felt to be multifactorial in setting of HFpEF and COPD exacerbation.  See echocardiogram report noted below.  Was currently undergoing rehab at [facility] nursing center.  Still noting weakness, denied any progressive leg edema.  Weight was down.  Was felt she would not be a good candidate for TEE given chronic respiratory status.  Recommend to possibly consider outpatient cardiac CT once patient was out of rehab and was feeling better at home.  Hospital admission in March 2025 for acute on chronic respiratory failure with hypoxia and hypercapnia, started on IV Lasix .  Workup revealed chest x-ray with small bilateral pleural effusions, associated bibasilar dependent compressive atelectasis, superimposed pneumonia not entirely excluded.  Noted to have cardiomegaly with moderate bilateral interstitial and alveolar pulmonary edema, likely CHF exacerbation.  proBNP 17,489.  Was on BiPAP during hospital stay.  Was referred to [facility] rehab for SNF placement.  Did well at SNF receiving OT/PT, PT and OT was arranged outpatient.  02/26/2024 - Today she presents for hospital follow-up.  She states she is doing much better since leaving the hospital. Now she is off bisoprolol  and now on metoprolol , tolerating  well. She is on 3.5 L of continuous oxygen . Tells me she has broken 4 ribs on left side, however I do not see recent imaging results to confirm this. Denies any chest pain, shortness of breath, palpitations, syncope, presyncope, dizziness, orthopnea, PND, swelling or significant weight changes, acute bleeding, or claudication. Had left knee operated on last fall.   Since I have last seen patient, she was hospitalized July 2025 and presented with dyspnea, deconditioning, and was admitted d/t acute on chronic respiratory failure with hypoxia and hypercapnia that was secondary to COPD exacerbation along with acute on chronic diastolic HF. Was d/c to SNF.   05/27/2024 - Today she presents for follow-up. She is here with her son. Son says she just got out of rehab today and [facility] SNF. Tells me she has been having low BP, overall asymptomatic. Overall doing well. Denies any chest pain, shortness of breath, palpitations, syncope, presyncope, dizziness, orthopnea, PND, swelling or significant weight changes, acute bleeding, or claudication. Son says she is about to start OP PT.   07/04/2024 - She is overall doing well. She is now on new inhaler, has a cough from this but says overall she is sounding better. Denies any acute cardiac complaints or issues. Denies any chest pain, shortness of breath, palpitations, syncope, presyncope, dizziness, orthopnea, PND, swelling or significant weight changes, acute bleeding, or claudication.  ROS: Negative. See HPI.   Studies Reviewed: SABRA    EKG: EKG is not ordered today.   Echo 04/2024:  1. Left ventricular ejection fraction, by estimation, is 60 to 65%. The  left ventricle has normal function. The left ventricle  has no regional  wall motion abnormalities. There is mild left ventricular hypertrophy.  Left ventricular diastolic parameters  are indeterminate. There is the interventricular septum is flattened in  systole and diastole, consistent with right ventricular  pressure and  volume overload.   2. Right ventricular systolic function was not well visualized. The right  ventricular size is not well visualized. There is moderately elevated  pulmonary artery systolic pressure.   3. 3.4 x 2.4 cm round density adjacent to the interatrial septum within  the left atrium, limited visualization. Consider TEE or cardiac CT for  further evaluation. . Left atrial size was severely dilated.   4. Right atrial size was severely dilated.   5. The mitral valve is abnormal. Mild mitral valve regurgitation. No  evidence of mitral stenosis. Moderate mitral annular calcification.   6. The tricuspid valve is abnormal. Tricuspid valve regurgitation is  moderate to severe.   7. There is a 25 mm St. Jude mechanical valve present in the aortic  position.     . The aortic valve has been repaired/replaced. Aortic valve  regurgitation is not visualized. No aortic stenosis is present. There is a  25 mm St. Jude mechanical valve present in the aortic position. Procedure  Date: 2007. Aortic valve mean gradient  measures 5.0 mmHg.   8. The inferior vena cava is dilated in size with >50% respiratory  variability, suggesting right atrial pressure of 8 mmHg.   Comparison(s): A prior study was performed on 09/10/2023. EF 65-70%.  Interventricular septum is flattened in systole and diastole, consistent  with right ventricular pressure and volume overload. Right ventricular  size is mildly enlarged and systolic  function mildly reduced. Large rounded echodensity within region of the  left atrium. Similar finding on previous echo. Severe bi-atrial  enlargement. Moderate mitral valve regurgitation. 25 mm mechancial St.  Jude aortic valve noted-men pg 5.0 mmHg.   Echo 08/2023:  1. Left ventricular ejection fraction, by estimation, is 65 to 70%. The  left ventricle has normal function. The left ventricle has no regional  wall motion abnormalities. There is mild left ventricular  hypertrophy.  Left ventricular diastolic parameters  are indeterminate. There is the interventricular septum is flattened in  systole and diastole, consistent with right ventricular pressure and  volume overload.   2. Right ventricular systolic function is mildly reduced. The right  ventricular size is mildly enlarged. There is mildly elevated pulmonary  artery systolic pressure. The estimated right ventricular systolic  pressure is 43.5 mmHg.   3. Left atrial size was severely dilated.   4. Large, rounded echodensity (approximately 3.5 x 3.5 cm) noted adjacent  to atrial septum within region of left atrium - best seen in subcostal  view and to less degree in apical two chamber view. Similar finding noted  on prior study although somewhat  obscured by acoustic shadowing from AVR. Concern would be to exclude  atrial myxoma although could be off axis imaging of atrial wall. Mass not  described on standard chest CT. Consider TEE or cardiac CTA for further  evaluation if clinically stable.   5. Right atrial size was severely dilated.   6. The mitral valve is degenerative. Moderate mitral valve regurgitation.  Moderate mitral annular calcification.   7. Tricuspid valve regurgitation is moderate.   8. The aortic valve has been repaired/replaced. Aortic valve  regurgitation is not visualized. There is a 25 mm St. Jude mechanical  valve present in the aortic position. Aortic valve mean gradient  measures  5.0 mmHg.   9. The inferior vena cava is dilated in size with <50% respiratory  variability, suggesting right atrial pressure of 15 mmHg.   Comparison(s): Prior images reviewed side by side.   Physical Exam:   VS:  BP (!) 158/74 (BP Location: Left Arm)   Pulse 62   Ht 5' 8 (1.727 m)   Wt 147 lb 3.2 oz (66.8 kg)   SpO2 100%   BMI 22.38 kg/m    Wt Readings from Last 3 Encounters:  07/04/24 147 lb 3.2 oz (66.8 kg)  05/27/24 135 lb (61.2 kg)  05/10/24 134 lb 7.7 oz (61 kg)    GEN:  Thin, frail 73 y.o. female in no acute distress NECK: No JVD; No carotid bruits CARDIAC: S1/S2, RRR, no murmurs, rubs, gallops RESPIRATORY:  Clear to auscultation without rales, wheezing or rhonchi  ABDOMEN: Soft, non-tender, non-distended EXTREMITIES:  No edema; No deformity   ASSESSMENT AND PLAN: .     HFpEF Stage C, NYHA class I-III symptoms.  EF 60-65% in July 2025.  See most recent Echo report noted above. Euvolemic well compensated on exam. GDMT limited at this time - hx of hypotension. No medication changes at this time.  Low sodium diet, fluid restriction <2L, and daily weights encouraged. Educated to contact our office for weight gain of 2 lbs overnight or 5 lbs in one week. She will update us  in 1 week regarding her weight trends.   CAD, s/p CABG Stable with no anginal symptoms. No indication for ischemic evaluation.  Not on ASA d/t being on Coumadin .  Continue current medication regimen. Heart healthy diet and regular cardiovascular exercise encouraged.   Aortic valve stenosis, s/p mechanical AVR in 2007 with 38 mm Hemashield tube graft repair of ascending aortic aneurysm Normal prosthetic valve function seen on most recent echo. Will continue to monitor with regular surveillance.   Permanent A-fib Denies any tachycardia or palpitations. No longer on BB as she was having hypotension. Continue current medication regimen.  Continue follow-up with Coumadin  clinic.  PCP is managing INR checks.   Abnormal Echo, valvular insufficiency Most recent echo 04/2024 revealed a 3.4 x 2.4 cm round density adjacent to the interatrial septum within the left atrium, limited visualization. TR moderate to severe. Not a good surgical candidate. Mild MR. Reviewed by Dr. Shlomo during most recent hospitalization and recommended that pt would not be an ideal candidate for TEE given her respiratory status. Stated could consider coronary CTA given her renal function and respiratory status, but was not a  candidate at the time during hospitalization due to her kidney function. Did discuss and review with pt, will continue to monitor at this time.   CKD stage 3b Most recent kidney function on file showed improved sCr of 1.26 with eGFR 45. Avoid nephrotoxic agents. No med changes at this time.Continue to follow with PCP.  9. COPD, pulmonary HTN Breathing is stable. Echo 04/2024 revealed moderately elevated PASP. WHO group 2/3. Continue to f/u with PCP.    Dispo: Follow-up with me/APP in 6 weeks or sooner if anything changes.   Signed, Almarie Crate, NP

## 2024-07-04 NOTE — Patient Instructions (Signed)
 Medication Instructions:  Your physician recommends that you continue on your current medications as directed. Please refer to the Current Medication list given to you today.  Labwork: None   Testing/Procedures: None   Follow-Up: Your physician recommends that you schedule a follow-up appointment in: 6 weeks   Any Other Special Instructions Will Be Listed Below (If Applicable).  Update us  in 1 week on your weight trends.  If you need a refill on your cardiac medications before your next appointment, please call your pharmacy.  HEART FAILURE INSTRUCTION SHEET  Follow a low-salt diet-you are allowed no more than 2,000 mg of sodium per day. Watch your fluid intake. In general, you should not be taking more than 64 ounces a day (no more than 8 glasses per day). Sometimes we refer to this as 2 liters per day. This includes sources of water  in food like soup, coffee, tea, milk etc. Weigh yourself on the same scale at the same time of the day preferably immediately after your first void. Keep a log of your weights. Call your doctor: (Anytime you feel any of the following symptoms)  3 lbs weight gain overnight or 5 lbs within a week Shortness of breath, with or without a day hacking cough Swelling in hands, feet or stomach If you have to sleep on extra pillows at night in order to breathe   IT IS IMPORTANT TO LET YOUR DOCTOR KNOW EARLY ON IF YOU ARE HAVING SYMPTOMS SO WE CAN HELP YOU!

## 2024-07-20 ENCOUNTER — Ambulatory Visit

## 2024-07-26 ENCOUNTER — Ambulatory Visit: Attending: Cardiology | Admitting: *Deleted

## 2024-07-26 DIAGNOSIS — Z952 Presence of prosthetic heart valve: Secondary | ICD-10-CM

## 2024-07-26 DIAGNOSIS — I482 Chronic atrial fibrillation, unspecified: Secondary | ICD-10-CM | POA: Diagnosis not present

## 2024-07-26 DIAGNOSIS — Z5181 Encounter for therapeutic drug level monitoring: Secondary | ICD-10-CM

## 2024-07-26 LAB — POCT INR: INR: 2.9 (ref 2.0–3.0)

## 2024-07-26 NOTE — Progress Notes (Signed)
 INR 2.9; Please see anticoagulation encounter

## 2024-07-26 NOTE — Patient Instructions (Signed)
 Continue warfarin 1 1/2 tablets daily  Recheck in 4 wk

## 2024-08-01 ENCOUNTER — Other Ambulatory Visit: Payer: Self-pay | Admitting: Cardiology

## 2024-08-22 ENCOUNTER — Ambulatory Visit: Admitting: *Deleted

## 2024-08-22 ENCOUNTER — Ambulatory Visit: Attending: Nurse Practitioner | Admitting: Nurse Practitioner

## 2024-08-22 ENCOUNTER — Encounter: Payer: Self-pay | Admitting: Nurse Practitioner

## 2024-08-22 VITALS — BP 124/70 | HR 84 | Ht 68.0 in | Wt 152.0 lb

## 2024-08-22 DIAGNOSIS — Z5181 Encounter for therapeutic drug level monitoring: Secondary | ICD-10-CM

## 2024-08-22 DIAGNOSIS — I482 Chronic atrial fibrillation, unspecified: Secondary | ICD-10-CM

## 2024-08-22 DIAGNOSIS — Z8679 Personal history of other diseases of the circulatory system: Secondary | ICD-10-CM

## 2024-08-22 DIAGNOSIS — I5032 Chronic diastolic (congestive) heart failure: Secondary | ICD-10-CM

## 2024-08-22 DIAGNOSIS — R931 Abnormal findings on diagnostic imaging of heart and coronary circulation: Secondary | ICD-10-CM

## 2024-08-22 DIAGNOSIS — I38 Endocarditis, valve unspecified: Secondary | ICD-10-CM

## 2024-08-22 DIAGNOSIS — I4821 Permanent atrial fibrillation: Secondary | ICD-10-CM

## 2024-08-22 DIAGNOSIS — Z952 Presence of prosthetic heart valve: Secondary | ICD-10-CM

## 2024-08-22 DIAGNOSIS — I2510 Atherosclerotic heart disease of native coronary artery without angina pectoris: Secondary | ICD-10-CM | POA: Diagnosis not present

## 2024-08-22 DIAGNOSIS — J449 Chronic obstructive pulmonary disease, unspecified: Secondary | ICD-10-CM

## 2024-08-22 DIAGNOSIS — I272 Pulmonary hypertension, unspecified: Secondary | ICD-10-CM

## 2024-08-22 DIAGNOSIS — N1832 Chronic kidney disease, stage 3b: Secondary | ICD-10-CM

## 2024-08-22 DIAGNOSIS — Z9889 Other specified postprocedural states: Secondary | ICD-10-CM

## 2024-08-22 LAB — POCT INR: INR: 2.4 (ref 2.0–3.0)

## 2024-08-22 NOTE — Patient Instructions (Signed)
 Medication Instructions:  Your physician recommends that you continue on your current medications as directed. Please refer to the Current Medication list given to you today.  *If you need a refill on your cardiac medications before your next appointment, please call your pharmacy*  Lab Work: NONE   If you have labs (blood work) drawn today and your tests are completely normal, you will receive your results only by: MyChart Message (if you have MyChart) OR A paper copy in the mail If you have any lab test that is abnormal or we need to change your treatment, we will call you to review the results.  Testing/Procedures: NONE   Follow-Up: At [facility], you and your health needs are our priority.  As part of our continuing mission to provide you with exceptional heart care, our providers are all part of one team.  This team includes your primary Cardiologist (physician) and Advanced Practice Providers or APPs (Physician Assistants and Nurse Practitioners) who all work together to provide you with the care you need, when you need it.  Your next appointment:   3 -4 month(s)  Provider:   You may see Jayson Sierras, MD or the following Advanced Practice Provider on your designated Care Team:   Almarie Crate, NP    We recommend signing up for the patient portal called MyChart.  Sign up information is provided on this After Visit Summary.  MyChart is used to connect with patients for Virtual Visits (Telemedicine).  Patients are able to view lab/test results, encounter notes, upcoming appointments, etc.  Non-urgent messages can be sent to your provider as well.   To learn more about what you can do with MyChart, go to [email or web address].   Other Instructions Thank you for choosing [facility]!

## 2024-08-22 NOTE — Patient Instructions (Signed)
 Continue warfarin 1 1/2 tablets daily  Recheck in 4 wk

## 2024-08-22 NOTE — Progress Notes (Signed)
 INR 2.4 Please see anticoagulation encounter

## 2024-08-22 NOTE — Progress Notes (Signed)
 Cardiology Office Note:  .   Date: 08/22/2024 ID:  [name], DOB 1951/04/12, MRN [id number] PCP: Renato Dorothey HERO, NP   HeartCare Providers Cardiologist:  Jayson Sierras, MD    History of Present Illness: .   [name] is a 73 y.o. female with a PMH of aortic valve stenosis, s/p mechanical AVR in 2007 with 38 mm Hemashield tube graft repair of ascending aortic aneurysm, HFpEF, CAD, s/p CABG in 2007, permanent A-fib, possible left atrial mass versus artifact, CKD stage IIIb, and COPD, who presents today for scheduled follow-up.  Last seen by Dr. Sierras on October 08, 2023.  She had been recently discharged from hospital with acute on chronic hypoxic/hypercarbic respiratory failure, felt to be multifactorial in setting of HFpEF and COPD exacerbation.  See echocardiogram report noted below.  Was currently undergoing rehab at [facility] nursing center.  Still noting weakness, denied any progressive leg edema.  Weight was down.  Was felt she would not be a good candidate for TEE given chronic respiratory status.  Recommend to possibly consider outpatient cardiac CT once patient was out of rehab and was feeling better at home.  Hospital admission in March 2025 for acute on chronic respiratory failure with hypoxia and hypercapnia, started on IV Lasix .  Workup revealed chest x-ray with small bilateral pleural effusions, associated bibasilar dependent compressive atelectasis, superimposed pneumonia not entirely excluded.  Noted to have cardiomegaly with moderate bilateral interstitial and alveolar pulmonary edema, likely CHF exacerbation.  proBNP 17,489.  Was on BiPAP during hospital stay.  Was referred to [facility] rehab for SNF placement.  Did well at SNF receiving OT/PT, PT and OT was arranged outpatient.  02/26/2024 - Today she presents for hospital follow-up.  She states she is doing much better since leaving the hospital. Now she is off bisoprolol  and now on metoprolol , tolerating  well. She is on 3.5 L of continuous oxygen . Tells me she has broken 4 ribs on left side, however I do not see recent imaging results to confirm this. Denies any chest pain, shortness of breath, palpitations, syncope, presyncope, dizziness, orthopnea, PND, swelling or significant weight changes, acute bleeding, or claudication. Had left knee operated on last fall.   Since I have last seen patient, she was hospitalized July 2025 and presented with dyspnea, deconditioning, and was admitted d/t acute on chronic respiratory failure with hypoxia and hypercapnia that was secondary to COPD exacerbation along with acute on chronic diastolic HF. Was d/c to SNF.   05/27/2024 - Today she presents for follow-up. She is here with her son. Son says she just got out of rehab today and [facility] SNF. Tells me she has been having low BP, overall asymptomatic. Overall doing well. Denies any chest pain, shortness of breath, palpitations, syncope, presyncope, dizziness, orthopnea, PND, swelling or significant weight changes, acute bleeding, or claudication. Son says she is about to start OP PT.   07/04/2024 - She is overall doing well. She is now on new inhaler, has a cough from this but says overall she is sounding better. Denies any acute cardiac complaints or issues. Denies any chest pain, shortness of breath, palpitations, syncope, presyncope, dizziness, orthopnea, PND, swelling or significant weight changes, acute bleeding, or claudication.  08/22/2024 - Here for follow-up. Doing great. Weight is only up 5 lbs compared to last office visit. Denies any chest pain, shortness of breath, palpitations, syncope, presyncope, dizziness, orthopnea, PND, swelling or significant weight changes, acute bleeding, or claudication.  ROS: Negative. See HPI.  Studies Reviewed: SABRA    EKG: EKG is not ordered today.   Echo 04/2024:  1. Left ventricular ejection fraction, by estimation, is 60 to 65%. The  left ventricle has normal function. The  left ventricle has no regional  wall motion abnormalities. There is mild left ventricular hypertrophy.  Left ventricular diastolic parameters  are indeterminate. There is the interventricular septum is flattened in  systole and diastole, consistent with right ventricular pressure and  volume overload.   2. Right ventricular systolic function was not well visualized. The right  ventricular size is not well visualized. There is moderately elevated  pulmonary artery systolic pressure.   3. 3.4 x 2.4 cm round density adjacent to the interatrial septum within  the left atrium, limited visualization. Consider TEE or cardiac CT for  further evaluation. . Left atrial size was severely dilated.   4. Right atrial size was severely dilated.   5. The mitral valve is abnormal. Mild mitral valve regurgitation. No  evidence of mitral stenosis. Moderate mitral annular calcification.   6. The tricuspid valve is abnormal. Tricuspid valve regurgitation is  moderate to severe.   7. There is a 25 mm St. Jude mechanical valve present in the aortic  position.     . The aortic valve has been repaired/replaced. Aortic valve  regurgitation is not visualized. No aortic stenosis is present. There is a  25 mm St. Jude mechanical valve present in the aortic position. Procedure  Date: 2007. Aortic valve mean gradient  measures 5.0 mmHg.   8. The inferior vena cava is dilated in size with >50% respiratory  variability, suggesting right atrial pressure of 8 mmHg.   Comparison(s): A prior study was performed on 09/10/2023. EF 65-70%.  Interventricular septum is flattened in systole and diastole, consistent  with right ventricular pressure and volume overload. Right ventricular  size is mildly enlarged and systolic  function mildly reduced. Large rounded echodensity within region of the  left atrium. Similar finding on previous echo. Severe bi-atrial  enlargement. Moderate mitral valve regurgitation. 25 mm mechancial  St.  Jude aortic valve noted-men pg 5.0 mmHg.   Echo 08/2023:  1. Left ventricular ejection fraction, by estimation, is 65 to 70%. The  left ventricle has normal function. The left ventricle has no regional  wall motion abnormalities. There is mild left ventricular hypertrophy.  Left ventricular diastolic parameters  are indeterminate. There is the interventricular septum is flattened in  systole and diastole, consistent with right ventricular pressure and  volume overload.   2. Right ventricular systolic function is mildly reduced. The right  ventricular size is mildly enlarged. There is mildly elevated pulmonary  artery systolic pressure. The estimated right ventricular systolic  pressure is 43.5 mmHg.   3. Left atrial size was severely dilated.   4. Large, rounded echodensity (approximately 3.5 x 3.5 cm) noted adjacent  to atrial septum within region of left atrium - best seen in subcostal  view and to less degree in apical two chamber view. Similar finding noted  on prior study although somewhat  obscured by acoustic shadowing from AVR. Concern would be to exclude  atrial myxoma although could be off axis imaging of atrial wall. Mass not  described on standard chest CT. Consider TEE or cardiac CTA for further  evaluation if clinically stable.   5. Right atrial size was severely dilated.   6. The mitral valve is degenerative. Moderate mitral valve regurgitation.  Moderate mitral annular calcification.   7. Tricuspid valve  regurgitation is moderate.   8. The aortic valve has been repaired/replaced. Aortic valve  regurgitation is not visualized. There is a 25 mm St. Jude mechanical  valve present in the aortic position. Aortic valve mean gradient measures  5.0 mmHg.   9. The inferior vena cava is dilated in size with <50% respiratory  variability, suggesting right atrial pressure of 15 mmHg.   Comparison(s): Prior images reviewed side by side.   Physical Exam:   VS:  BP 124/70  (BP Location: Left Arm, Cuff Size: Normal)   Pulse 84   Ht 5' 8 (1.727 m)   Wt 152 lb (68.9 kg)   SpO2 98% Comment: 4 Lpm via [place]  BMI 23.11 kg/m    Wt Readings from Last 3 Encounters:  08/22/24 152 lb (68.9 kg)  07/04/24 147 lb 3.2 oz (66.8 kg)  05/27/24 135 lb (61.2 kg)    GEN: Thin, 73 y.o. female in no acute distress NECK: No JVD; No carotid bruits CARDIAC: S1/S2, RRR, no murmurs, rubs, gallops RESPIRATORY:  Clear to auscultation without rales, wheezing or rhonchi  ABDOMEN: Soft, non-tender, non-distended EXTREMITIES:  No edema; No deformity   ASSESSMENT AND PLAN: .     HFpEF Stage C, NYHA class I-III symptoms.  EF 60-65% in July 2025.  See most recent Echo report noted above. Euvolemic well compensated on exam. GDMT limited at this time - hx of hypotension. No medication changes at this time.  Low sodium diet, fluid restriction <2L, and daily weights encouraged. Educated to contact our office for weight gain of 2 lbs overnight or 5 lbs in one week.   CAD, s/p CABG Stable with no anginal symptoms. No indication for ischemic evaluation.  Not on ASA d/t being on Coumadin .  Continue current medication regimen. Heart healthy diet and regular cardiovascular exercise encouraged.   Aortic valve stenosis, s/p mechanical AVR in 2007 with 38 mm Hemashield tube graft repair of ascending aortic aneurysm Normal prosthetic valve function seen on most recent echo. Will continue to monitor with regular surveillance.   Permanent A-fib Denies any tachycardia or palpitations. No longer on BB as she was having hypotension. Continue current medication regimen.  Continue follow-up with Coumadin  clinic.  PCP is managing INR checks.   Abnormal Echo, valvular insufficiency Most recent echo 04/2024 revealed a 3.4 x 2.4 cm round density adjacent to the interatrial septum within the left atrium, limited visualization. TR moderate to severe. Not a good surgical candidate. Mild MR. Reviewed by Dr. Shlomo  during most recent hospitalization and recommended that pt would not be an ideal candidate for TEE given her respiratory status. Stated could consider coronary CTA given her renal function and respiratory status, but was not a candidate at the time during hospitalization due to her kidney function. Did discuss and reviewed with pt, and came to shared medical decision that we will continue to monitor at this time.   CKD stage 3b Most recent kidney function on file showed improved sCr of 1.26 with eGFR 45. Avoid nephrotoxic agents. No med changes at this time. Continue to follow with PCP.  9. COPD, pulmonary HTN Breathing is stable. Echo 04/2024 revealed moderately elevated PASP. WHO group 2/3. Continue to f/u with PCP.    Dispo: Follow-up with MD/APP in 3-4 months or sooner if anything changes.   Signed, Almarie Crate, NP

## 2024-09-08 ENCOUNTER — Inpatient Hospital Stay (HOSPITAL_COMMUNITY)
Admission: EM | Admit: 2024-09-08 | Discharge: 2024-09-20 | DRG: 291 | Disposition: A | Discharge status: Skilled Nursing Facility | Attending: Family Medicine | Admitting: Family Medicine

## 2024-09-08 ENCOUNTER — Other Ambulatory Visit: Payer: Self-pay

## 2024-09-08 ENCOUNTER — Emergency Department (HOSPITAL_COMMUNITY)

## 2024-09-08 ENCOUNTER — Encounter (HOSPITAL_COMMUNITY): Payer: Self-pay | Admitting: Emergency Medicine

## 2024-09-08 DIAGNOSIS — Z85030 Personal history of malignant carcinoid tumor of large intestine: Secondary | ICD-10-CM

## 2024-09-08 DIAGNOSIS — I5033 Acute on chronic diastolic (congestive) heart failure: Secondary | ICD-10-CM | POA: Diagnosis present

## 2024-09-08 DIAGNOSIS — Z7901 Long term (current) use of anticoagulants: Secondary | ICD-10-CM

## 2024-09-08 DIAGNOSIS — N183 Chronic kidney disease, stage 3 unspecified: Secondary | ICD-10-CM | POA: Diagnosis present

## 2024-09-08 DIAGNOSIS — I130 Hypertensive heart and chronic kidney disease with heart failure and stage 1 through stage 4 chronic kidney disease, or unspecified chronic kidney disease: Principal | ICD-10-CM | POA: Diagnosis present

## 2024-09-08 DIAGNOSIS — G2581 Restless legs syndrome: Secondary | ICD-10-CM | POA: Diagnosis present

## 2024-09-08 DIAGNOSIS — Z952 Presence of prosthetic heart valve: Secondary | ICD-10-CM

## 2024-09-08 DIAGNOSIS — N1832 Chronic kidney disease, stage 3b: Secondary | ICD-10-CM | POA: Diagnosis present

## 2024-09-08 DIAGNOSIS — J9621 Acute and chronic respiratory failure with hypoxia: Secondary | ICD-10-CM | POA: Diagnosis present

## 2024-09-08 DIAGNOSIS — J3089 Other allergic rhinitis: Secondary | ICD-10-CM | POA: Diagnosis present

## 2024-09-08 DIAGNOSIS — E876 Hypokalemia: Secondary | ICD-10-CM | POA: Diagnosis present

## 2024-09-08 DIAGNOSIS — J9611 Chronic respiratory failure with hypoxia: Secondary | ICD-10-CM

## 2024-09-08 DIAGNOSIS — Z90710 Acquired absence of both cervix and uterus: Secondary | ICD-10-CM

## 2024-09-08 DIAGNOSIS — I4821 Permanent atrial fibrillation: Secondary | ICD-10-CM | POA: Diagnosis present

## 2024-09-08 DIAGNOSIS — F419 Anxiety disorder, unspecified: Secondary | ICD-10-CM | POA: Diagnosis present

## 2024-09-08 DIAGNOSIS — I350 Nonrheumatic aortic (valve) stenosis: Secondary | ICD-10-CM | POA: Diagnosis present

## 2024-09-08 DIAGNOSIS — Z951 Presence of aortocoronary bypass graft: Secondary | ICD-10-CM

## 2024-09-08 DIAGNOSIS — E039 Hypothyroidism, unspecified: Secondary | ICD-10-CM | POA: Diagnosis present

## 2024-09-08 DIAGNOSIS — Z9049 Acquired absence of other specified parts of digestive tract: Secondary | ICD-10-CM

## 2024-09-08 DIAGNOSIS — Z9981 Dependence on supplemental oxygen: Secondary | ICD-10-CM

## 2024-09-08 DIAGNOSIS — I2583 Coronary atherosclerosis due to lipid rich plaque: Secondary | ICD-10-CM | POA: Diagnosis not present

## 2024-09-08 DIAGNOSIS — D649 Anemia, unspecified: Secondary | ICD-10-CM | POA: Diagnosis present

## 2024-09-08 DIAGNOSIS — Z79899 Other long term (current) drug therapy: Secondary | ICD-10-CM

## 2024-09-08 DIAGNOSIS — J4489 Other specified chronic obstructive pulmonary disease: Secondary | ICD-10-CM | POA: Diagnosis present

## 2024-09-08 DIAGNOSIS — R54 Age-related physical debility: Secondary | ICD-10-CM | POA: Diagnosis present

## 2024-09-08 DIAGNOSIS — R627 Adult failure to thrive: Secondary | ICD-10-CM | POA: Diagnosis present

## 2024-09-08 DIAGNOSIS — I2510 Atherosclerotic heart disease of native coronary artery without angina pectoris: Secondary | ICD-10-CM | POA: Diagnosis present

## 2024-09-08 DIAGNOSIS — I25810 Atherosclerosis of coronary artery bypass graft(s) without angina pectoris: Secondary | ICD-10-CM | POA: Diagnosis present

## 2024-09-08 DIAGNOSIS — E785 Hyperlipidemia, unspecified: Secondary | ICD-10-CM | POA: Diagnosis present

## 2024-09-08 DIAGNOSIS — R350 Frequency of micturition: Secondary | ICD-10-CM | POA: Diagnosis present

## 2024-09-08 DIAGNOSIS — I509 Heart failure, unspecified: Principal | ICD-10-CM

## 2024-09-08 DIAGNOSIS — Z66 Do not resuscitate: Secondary | ICD-10-CM | POA: Diagnosis present

## 2024-09-08 DIAGNOSIS — Z860101 Personal history of adenomatous and serrated colon polyps: Secondary | ICD-10-CM

## 2024-09-08 DIAGNOSIS — Z880 Allergy status to penicillin: Secondary | ICD-10-CM

## 2024-09-08 DIAGNOSIS — R791 Abnormal coagulation profile: Secondary | ICD-10-CM | POA: Diagnosis present

## 2024-09-08 DIAGNOSIS — Z87891 Personal history of nicotine dependence: Secondary | ICD-10-CM

## 2024-09-08 DIAGNOSIS — K219 Gastro-esophageal reflux disease without esophagitis: Secondary | ICD-10-CM | POA: Diagnosis present

## 2024-09-08 DIAGNOSIS — Z6823 Body mass index (BMI) 23.0-23.9, adult: Secondary | ICD-10-CM

## 2024-09-08 DIAGNOSIS — Z79890 Hormone replacement therapy (postmenopausal): Secondary | ICD-10-CM

## 2024-09-08 LAB — PRO BRAIN NATRIURETIC PEPTIDE: Pro Brain Natriuretic Peptide: 5004 pg/mL — ABNORMAL HIGH (ref <300.0)

## 2024-09-08 LAB — CBC WITH DIFFERENTIAL/PLATELET
Abs Immature Granulocytes: 0.01 K/uL (ref 0.00–0.07)
Basophils Absolute: 0 K/uL (ref 0.0–0.1)
Basophils Relative: 1 %
Eosinophils Absolute: 0.1 K/uL (ref 0.0–0.5)
Eosinophils Relative: 2 %
HCT: 29.3 % — ABNORMAL LOW (ref 36.0–46.0)
Hemoglobin: 8.9 g/dL — ABNORMAL LOW (ref 12.0–15.0)
Immature Granulocytes: 0 %
Lymphocytes Relative: 13 %
Lymphs Abs: 0.8 K/uL (ref 0.7–4.0)
MCH: 27.4 pg (ref 26.0–34.0)
MCHC: 30.4 g/dL (ref 30.0–36.0)
MCV: 90.2 fL (ref 80.0–100.0)
Monocytes Absolute: 0.7 K/uL (ref 0.1–1.0)
Monocytes Relative: 12 %
Neutro Abs: 4.3 K/uL (ref 1.7–7.7)
Neutrophils Relative %: 72 %
Platelets: 211 K/uL (ref 150–400)
RBC: 3.25 MIL/uL — ABNORMAL LOW (ref 3.87–5.11)
RDW: 14.6 % (ref 11.5–15.5)
WBC: 5.9 K/uL (ref 4.0–10.5)
nRBC: 0 % (ref 0.0–0.2)

## 2024-09-08 LAB — COMPREHENSIVE METABOLIC PANEL WITH GFR
ALT: 13 U/L (ref 0–44)
AST: 29 U/L (ref 15–41)
Albumin: 4.1 g/dL (ref 3.5–5.0)
Alkaline Phosphatase: 91 U/L (ref 38–126)
Anion gap: 10 (ref 5–15)
BUN: 24 mg/dL — ABNORMAL HIGH (ref 8–23)
CO2: 32 mmol/L (ref 22–32)
Calcium: 8.9 mg/dL (ref 8.9–10.3)
Chloride: 100 mmol/L (ref 98–111)
Creatinine, Ser: 1.66 mg/dL — ABNORMAL HIGH (ref 0.44–1.00)
GFR, Estimated: 32 mL/min — ABNORMAL LOW (ref >60)
Glucose, Bld: 116 mg/dL — ABNORMAL HIGH (ref 70–99)
Potassium: 2.9 mmol/L — ABNORMAL LOW (ref 3.5–5.1)
Sodium: 142 mmol/L (ref 135–145)
Total Bilirubin: 0.7 mg/dL (ref 0.0–1.2)
Total Protein: 6.8 g/dL (ref 6.5–8.1)

## 2024-09-08 MED ORDER — LUMATEPERONE TOSYLATE 42 MG PO CAPS
42.0000 mg | ORAL_CAPSULE | Freq: Every day | ORAL | Status: DC
  Administered 2024-09-09 – 2024-09-19 (×11): 42 mg via ORAL
  Filled 2024-09-08 (×13): qty 1

## 2024-09-08 MED ORDER — BUDESON-GLYCOPYRROL-FORMOTEROL 160-9-4.8 MCG/ACT IN AERO
2.0000 | INHALATION_SPRAY | Freq: Two times a day (BID) | RESPIRATORY_TRACT | Status: DC
  Administered 2024-09-09 – 2024-09-20 (×23): 2 via RESPIRATORY_TRACT
  Filled 2024-09-08 (×2): qty 5.9

## 2024-09-08 MED ORDER — DILTIAZEM HCL ER COATED BEADS 120 MG PO CP24
120.0000 mg | ORAL_CAPSULE | Freq: Every day | ORAL | Status: DC
  Administered 2024-09-09 – 2024-09-20 (×12): 120 mg via ORAL
  Filled 2024-09-08 (×12): qty 1

## 2024-09-08 MED ORDER — POTASSIUM CHLORIDE 10 MEQ/100ML IV SOLN
10.0000 meq | INTRAVENOUS | Status: DC
  Administered 2024-09-08: 10 meq via INTRAVENOUS
  Filled 2024-09-08: qty 100

## 2024-09-08 MED ORDER — ACETAMINOPHEN 650 MG RE SUPP: 650.0000 mg | Freq: Four times a day (QID) | RECTAL | Status: DC | PRN

## 2024-09-08 MED ORDER — POTASSIUM CHLORIDE CRYS ER 20 MEQ PO TBCR
60.0000 meq | EXTENDED_RELEASE_TABLET | Freq: Once | ORAL | Status: AC
  Administered 2024-09-08: 60 meq via ORAL
  Filled 2024-09-08: qty 3

## 2024-09-08 MED ORDER — POTASSIUM CHLORIDE 10 MEQ/100ML IV SOLN
10.0000 meq | INTRAVENOUS | Status: AC
  Administered 2024-09-08: 10 meq via INTRAVENOUS
  Filled 2024-09-08: qty 100

## 2024-09-08 MED ORDER — ALPRAZOLAM 0.5 MG PO TABS
0.5000 mg | ORAL_TABLET | Freq: Two times a day (BID) | ORAL | Status: DC
  Administered 2024-09-08 – 2024-09-20 (×24): 0.5 mg via ORAL
  Filled 2024-09-08 (×24): qty 1

## 2024-09-08 MED ORDER — POTASSIUM CHLORIDE CRYS ER 20 MEQ PO TBCR
40.0000 meq | EXTENDED_RELEASE_TABLET | Freq: Every day | ORAL | Status: DC
  Administered 2024-09-09 – 2024-09-20 (×12): 40 meq via ORAL
  Filled 2024-09-08 (×13): qty 2

## 2024-09-08 MED ORDER — ACETAMINOPHEN 325 MG PO TABS
650.0000 mg | ORAL_TABLET | Freq: Four times a day (QID) | ORAL | Status: DC | PRN
  Administered 2024-09-10 – 2024-09-16 (×7): 650 mg via ORAL
  Filled 2024-09-08 (×7): qty 2

## 2024-09-08 MED ORDER — POTASSIUM CHLORIDE CRYS ER 20 MEQ PO TBCR
20.0000 meq | EXTENDED_RELEASE_TABLET | Freq: Once | ORAL | Status: AC
  Administered 2024-09-09: 20 meq via ORAL
  Filled 2024-09-08: qty 1

## 2024-09-08 MED ORDER — ROSUVASTATIN CALCIUM 20 MG PO TABS
20.0000 mg | ORAL_TABLET | Freq: Every day | ORAL | Status: DC
  Administered 2024-09-09 – 2024-09-20 (×12): 20 mg via ORAL
  Filled 2024-09-08 (×12): qty 1

## 2024-09-08 MED ORDER — ALBUTEROL SULFATE (2.5 MG/3ML) 0.083% IN NEBU
2.5000 mg | INHALATION_SOLUTION | RESPIRATORY_TRACT | Status: DC | PRN
  Administered 2024-09-09 (×2): 2.5 mg via RESPIRATORY_TRACT
  Filled 2024-09-08 (×2): qty 3

## 2024-09-08 MED ORDER — ROPINIROLE HCL 1 MG PO TABS
3.0000 mg | ORAL_TABLET | Freq: Two times a day (BID) | ORAL | Status: DC
  Administered 2024-09-08 – 2024-09-20 (×24): 3 mg via ORAL
  Filled 2024-09-08 (×24): qty 3

## 2024-09-08 MED ORDER — ADULT MULTIVITAMIN W/MINERALS CH
1.0000 | ORAL_TABLET | Freq: Every day | ORAL | Status: DC
  Administered 2024-09-09 – 2024-09-20 (×12): 1 via ORAL
  Filled 2024-09-08 (×12): qty 1

## 2024-09-08 MED ORDER — LEVOTHYROXINE SODIUM 50 MCG PO TABS
50.0000 ug | ORAL_TABLET | Freq: Every day | ORAL | Status: DC
  Administered 2024-09-09 – 2024-09-20 (×12): 50 ug via ORAL
  Filled 2024-09-08 (×12): qty 1

## 2024-09-08 MED ORDER — POTASSIUM CHLORIDE CRYS ER 20 MEQ PO TBCR: 60.0000 meq | EXTENDED_RELEASE_TABLET | Freq: Four times a day (QID) | ORAL | Status: DC

## 2024-09-08 MED ORDER — HYDROCODONE-ACETAMINOPHEN 10-325 MG PO TABS
1.0000 | ORAL_TABLET | Freq: Four times a day (QID) | ORAL | Status: DC | PRN
  Administered 2024-09-08 – 2024-09-19 (×18): 1 via ORAL
  Filled 2024-09-08 (×18): qty 1

## 2024-09-08 MED ORDER — FUROSEMIDE 10 MG/ML IJ SOLN: 40.0000 mg | Freq: Two times a day (BID) | INTRAMUSCULAR | Status: DC

## 2024-09-08 NOTE — ED Provider Notes (Signed)
 [place] EMERGENCY DEPARTMENT AT [facility] Provider Note   CSN: [id number] Arrival date & time: 09/08/24  1756     Patient presents with: Shortness of Breath   [name] is a 73 y.o. female.    Shortness of Breath Patient presents with shortness of breath.  History of CHF.  States she feels as if her weight is up.  More short of breath.  Is on chronic oxygen  but was hypoxic with sats of 85% upon arrival.  No chest pain.  No real cough.    Past Medical History:  Diagnosis Date   Anxiety disorder    Aortic stenosis    Status post St. Jude mechanical AVR 2007   Asthma    Atrial fibrillation (HCC)    Carcinoid tumor of colon (HCC) 2007   Chronic diastolic heart failure (HCC)    Coronary atherosclerosis of native coronary artery    Status post CABG 2007   GERD (gastroesophageal reflux disease)    History of colonoscopy 2003   Dr. 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 Shaaron, benign biopsies   RLS (restless legs syndrome)    Schatzki's ring     Prior to Admission medications   Medication Sig Start Date End Date Taking? Authorizing Provider  albuterol  (VENTOLIN  HFA) 108 (90 Base) MCG/ACT inhaler Inhale 1-2 puffs into the lungs daily as needed for wheezing or shortness of breath. 01/19/24  Yes [provider]  ALPRAZolam  (XANAX ) 0.5 MG tablet Take 1 tablet (0.5 mg total) by mouth 2 (two) times daily. 05/10/24  Yes Ricky Fines, MD  CAPLYTA  42 MG capsule Take 42 mg by mouth at bedtime. 12/09/23  Yes [provider]  diltiazem  (TIAZAC ) 120 MG 24 hr capsule Take 120 mg by mouth daily. 08/09/24  Yes [provider]  HYDROcodone -acetaminophen  (NORCO) 10-325 MG tablet Take 1 tablet by mouth every 6 (six) hours as needed for moderate pain (pain score 4-6).   Yes [provider]  ipratropium (ATROVENT ) 0.03 % nasal spray Place 2 sprays into both  nostrils 2 (two) times daily as needed for rhinitis.  09/08/24 Yes [provider]  levothyroxine  (SYNTHROID , LEVOTHROID) 50 MCG tablet Take 50 mcg by mouth daily before breakfast.    Yes [provider]  Multiple Vitamin (MULTIVITAMIN WITH MINERALS) TABS tablet Take 1 tablet by mouth daily.   Yes [provider]  OXYGEN  Inhale 4 L into the lungs daily.   Yes [provider]  potassium chloride  SA (KLOR-CON  M) 20 MEQ tablet Take 2 tablets (40 mEq total) by mouth daily. 05/27/24  Yes Miriam Norris, NP  rOPINIRole  (REQUIP ) 3 MG tablet Take 1 tablet (3 mg total) by mouth 2 (two) times daily. 09/23/23  Yes Shahmehdi, Seyed A, MD  rosuvastatin  (CRESTOR ) 20 MG tablet TAKE ONE (1) TABLET BY MOUTH EVERY DAY 08/03/24  Yes Miriam Norris, NP  torsemide  (DEMADEX ) 100 MG tablet Take 50 mg by mouth 2 (two) times daily. 07/06/24  Yes [provider]  TRELEGY ELLIPTA 100-62.5-25 MCG/ACT AEPB Inhale 1 puff into the lungs daily. 06/25/24  Yes [provider]  warfarin (COUMADIN ) 2 MG tablet Take 1.5-2 tablets (3-4 mg total) by mouth See admin instructions. 4mg  on Tuesday and 3 mg daily the rest of the week. 09/06/18  Yes Ricky Fines, MD  diltiazem  (CARDIZEM  CD) 120 MG 24 hr capsule Take 1  capsule (120 mg total) by mouth daily. 04/01/18   Ricky Fines, MD    Allergies: Penicillin g, Penicillins, and Dust mite extract    Review of Systems  Respiratory:  Positive for shortness of breath.     Updated Vital Signs BP 108/63   Pulse 74   Temp 98.2 F (36.8 C) (Oral)   Resp (!) 22   Wt 68.9 kg   SpO2 94%   BMI 23.10 kg/m   Physical Exam Vitals and nursing note reviewed.  HENT:     Head: Normocephalic.  Cardiovascular:     Rate and Rhythm: Normal rate.  Pulmonary:     Comments: Tachypnea.  Somewhat harsh breath sounds. Musculoskeletal:     Right lower leg: Edema present.     Left lower leg: Edema present.  Skin:    Capillary Refill: Capillary  refill takes less than 2 seconds.  Neurological:     Mental Status: She is alert.     (all labs ordered are listed, but only abnormal results are displayed) Labs Reviewed  COMPREHENSIVE METABOLIC PANEL WITH GFR - Abnormal; Notable for the following components:      Result Value   Potassium 2.9 (*)    Glucose, Bld 116 (*)    BUN 24 (*)    Creatinine, Ser 1.66 (*)    GFR, Estimated 32 (*)    All other components within normal limits  PRO BRAIN NATRIURETIC PEPTIDE - Abnormal; Notable for the following components:   Pro Brain Natriuretic Peptide 5,004.0 (*)    All other components within normal limits  CBC WITH DIFFERENTIAL/PLATELET - Abnormal; Notable for the following components:   RBC 3.25 (*)    Hemoglobin 8.9 (*)    HCT 29.3 (*)    All other components within normal limits  PROTIME-INR - Abnormal; Notable for the following components:   Prothrombin Time 52.8 (*)    INR 5.6 (*)    All other components within normal limits    EKG: EKG Interpretation Date/Time:  Thursday September 08 2024 18:33:19 EST Ventricular Rate:  98 PR Interval:    QRS Duration:  130 QT Interval:  379 QTC Calculation: 484 R Axis:   82  Text Interpretation: Atrial fibrillation Right bundle branch block Nonspecific T abnormalities, lateral leads Borderline ST elevation, lateral leads Confirmed by Patsey Lot [phone]) on 09/08/2024 8:38:03 PM  Radiology: [facility] Chest Portable 1 View Result Date: 09/08/2024 CLINICAL DATA:  Shortness of breath. EXAM: PORTABLE CHEST 1 VIEW COMPARISON:  05/05/2024 FINDINGS: Stable cardiac enlargement and prominent mediastinal and hilar contours. Tortuosity and calcification of the aorta again noted. Peribronchial thickening and increased interstitial markings likely pulmonary edema superimposed on chronic bronchitic changes. There is a chronic right pleural effusion and overlying atelectasis and or scarring. No pneumothorax. IMPRESSION: Cardiac enlargement, pulmonary edema  and chronic right pleural effusion. Electronically Signed   By: MYRTIS Stammer M.D.   On: 09/08/2024 19:01     Procedures   Medications Ordered in the ED - No data to display                                  Medical Decision Making Amount and/or Complexity of Data Reviewed Labs: ordered. Radiology: ordered.   Patient with shortness of breath.  Some hypoxia.  Pulmonary edema considered most likely.  Although pneumonia and other causes considered.  Patient states she feels if her weight is up.  Reviewed previous  cardiac note.  Chest x-ray shows edema.  BNP elevated.  Also has hypokalemia.  Will limit somewhat the use of diuretics until supplementation.  Will discuss with hospitalist for admission.  CRITICAL CARE Performed by: [place] Total critical care time: 30 minutes Critical care time was exclusive of separately billable procedures and treating other patients. Critical care was necessary to treat or prevent imminent or life-threatening deterioration. Critical care was time spent personally by me on the following activities: development of treatment plan with patient and/or surrogate as well as nursing, discussions with consultants, evaluation of patient's response to treatment, examination of patient, obtaining history from patient or surrogate, ordering and performing treatments and interventions, ordering and review of laboratory studies, ordering and review of radiographic studies, pulse oximetry and re-evaluation of patient's condition.      Final diagnoses:  Acute on chronic congestive heart failure, unspecified heart failure type [facility])  Hypokalemia    ED Discharge Orders     None          River Rankin, MD 09/08/24 2039

## 2024-09-08 NOTE — ED Notes (Signed)
 Per hospitalist okay to change potassium rate to 59ml/hr due to pt in-toleration.

## 2024-09-08 NOTE — Consult Note (Signed)
 PHARMACY - ANTICOAGULATION CONSULT NOTE  Pharmacy Consult for Warfarin Indication: mAVR & Afib  Allergies  Allergen Reactions   Penicillin G Itching   Penicillins Hives, Itching, Swelling and Rash   Dust Mite Extract Other (See Comments)    Seasonal allergies    Patient Measurements: Weight: 68.9 kg (151 lb 14.4 oz)  Vital Signs: Temp: 98.4 F (36.9 C) (11/20 2059) Temp Source: Oral (11/20 2059) BP: 114/62 (11/20 2100) Pulse Rate: 81 (11/20 2100)  Labs: Recent Labs    09/08/24 1909  HGB 8.9*  HCT 29.3*  PLT 211  LABPROT 52.8*  INR 5.6*  CREATININE 1.66*    Estimated Creatinine Clearance: 30.9 mL/min (A) (by C-G formula based on SCr of 1.66 mg/dL (H)).   Medical History: Past Medical History:  Diagnosis Date   Anxiety disorder    Aortic stenosis    Status post St. Jude mechanical AVR 2007   Asthma    Atrial fibrillation (HCC)    Carcinoid tumor of colon (HCC) 2007   Chronic diastolic heart failure (HCC)    Coronary atherosclerosis of native coronary artery    Status post CABG 2007   GERD (gastroesophageal reflux disease)    History of colonoscopy 2003   Dr. 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 Shaaron, benign biopsies   RLS (restless legs syndrome)    Schatzki's ring     Medications:  Warfarin 3 mg daily except for 4 mg on Tuesday (TWD: 22 mg)  Last patient reported dose was 11/20 at noon  Assessment: 73 y/o F with medical history as above presenting with shortness of breath and CXR impression of pulmonary edema. Pharmacy consulted to dose warfarin while patient is admitted.  INR today is 5.6. CBC notable for anemia with Hgb of 8.9 which appears consistent with patient's baseline. No mention of bleeding per chart review  Goal of Therapy:  INR 2.5 to 3.5 with mAVR and Afib Monitor platelets by anticoagulation protocol: Yes   Plan:  --INR is supratherapeutic.  Patient already took warfarin today. Will follow daily INR to guide dosing while admitted  [name] 09/08/2024,9:23 PM

## 2024-09-08 NOTE — H&P (Signed)
 [facility] H&P   Patient Demographics:    [name], is a 73 y.o. female  MRN: [id number]   DOB - 09/21/1951  Admit Date - 09/08/2024  Outpatient Primary MD for the patient is Renato Dorothey HERO, NP  Referring MD/NP/PA: Dr. Patsey  Outpatient Specialists: Cardiology Dr. Debera  Patient coming from: Home  Chief Complaint  Patient presents with   Shortness of Breath      HPI:    [name]  is a 73 y.o. female,  with longstanding history of COPD and chronic respiratory failure typically on 4 L/min supplemental oxygen , chronic atrial fibrillation, HTN, HLD, CAD status post 2V CABG with mechanical aortic valve replacement, at baseline patient 4 L oxygen  at home.-  - Presents to ED secondary to complaints of shortness of breath, reports progressive over the last 3 days, reports initially with activity, currently reports orthopnea as well, reports compliance with her home diuretics, and salt restriction, denies cough, fever or chills, no chest pain, reports baseline lower extremity edema - In ED saturating 85% on her 4 L nasal cannula, requiring up to 6 L, creatinine lower than baseline at 1.66, chest x-ray significant for cardiomegaly, chronic right pleural effusion and pulmonary edema, proBNP significantly elevated, INR is elevated at 5.6, potassium low at 2.9, [facility] hospitalist consulted to admit.   Review of systems:      A full 10 point Review of Systems was done, except as stated above, all other Review of Systems were negative.   With Past History of the following :    Past Medical History:  Diagnosis Date   Anxiety disorder    Aortic stenosis    Status post St. Jude mechanical AVR 2007   Asthma    Atrial fibrillation (HCC)    Carcinoid tumor of colon (HCC) 2007   Chronic diastolic heart failure (HCC)    Coronary atherosclerosis of native coronary artery     Status post CABG 2007   GERD (gastroesophageal reflux disease)    History of colonoscopy 2003   Dr. 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 Shaaron, benign biopsies   RLS (restless legs syndrome)    Schatzki's ring       Past Surgical History:  Procedure Laterality Date   ABDOMINAL HYSTERECTOMY     AORTIC VALVE REPLACEMENT  2007   #25 mm St. Jude mechanical prosthesis with Hemashield tube graft repair of ascending aneurysm   APPENDECTOMY  2007   BACK SURGERY     lumbar 4 and 5    BIOPSY  02/05/2012   RMR:Two tongues of salmon-colored epithelium distal esophagus, very short-segment Barrett's s/p bx/Small hiatal hernia, otherwise normal stomach, D1, D2. Status post esophageal dilation. Biopsy showed GERD.   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 Shaaron- schatzki's ring, shoft peptic stricture at GE junction.   ESOPHAGOGASTRODUODENOSCOPY (EGD) WITH PROPOFOL  N/A 02/08/2018   Procedure: ESOPHAGOGASTRODUODENOSCOPY (EGD) WITH PROPOFOL ;  Surgeon: Shaaron Lamar HERO, MD;  Location: [facility];  Service: Endoscopy;  Laterality: N/A;  8:15am   FOOT SURGERY     bilateral bunionectomy   HERNIA REPAIR     with mesh   LAPAROTOMY  2007   small bowel resection secondary to small bowel obstruction   MALONEY DILATION  02/05/2012   Procedure: AGAPITO DILATION;  Surgeon: Lamar HERO Shaaron, MD;  Location: [facility];  Service: Endoscopy;  Laterality: N/A;  56mm    MALONEY DILATION N/A 02/08/2018   Procedure: AGAPITO DILATION;  Surgeon: Shaaron Lamar HERO, MD;  Location: [facility];  Service:  Endoscopy;  Laterality: N/A;   ORIF TIBIA PLATEAU Right 06/18/2023   Procedure: OPEN REDUCTION INTERNAL FIXATION (ORIF) TIBIAL PLATEAU;  Surgeon: Onesimo Oneil LABOR, MD;  Location: [facility];  Service: Orthopedics;  Laterality: Right;   Teeth removal        Social History:     Social History   Tobacco Use   Smoking status: Former    Current packs/day: 0.00    Average packs/day: 1 pack/day for 20.0 years (20.0 ttl pk-yrs)    Types: Cigarettes    Start date: 10/20/1968    Quit date: 10/20/1988    Years since quitting: 35.9   Smokeless tobacco: Never  Substance Use Topics   Alcohol use: No    Alcohol/week: 0.0 standard drinks of alcohol       Family History :     Family History  Problem Relation Age of Onset   Stroke Mother    Cancer Father    Anesthesia problems Neg Hx    Hypotension Neg Hx    Malignant hyperthermia Neg Hx    Pseudochol deficiency Neg Hx       Home Medications:   Prior to Admission medications   Medication Sig Start Date End Date Taking? Authorizing Provider  albuterol  (VENTOLIN  HFA) 108 (90 Base) MCG/ACT inhaler Inhale 1-2 puffs into the lungs daily as needed for wheezing or shortness of breath. 01/19/24  Yes [provider]  ALPRAZolam  (XANAX ) 0.5 MG tablet Take 1 tablet (0.5 mg total) by mouth 2 (two) times daily. 05/10/24  Yes Ricky Fines, MD  CAPLYTA  42 MG capsule Take 42 mg by mouth at bedtime. 12/09/23  Yes [provider]  diltiazem  (TIAZAC ) 120 MG 24 hr capsule Take 120 mg by mouth daily. 08/09/24  Yes [provider]  HYDROcodone -acetaminophen  (NORCO) 10-325 MG tablet Take 1 tablet by mouth every 6 (six) hours as needed for moderate pain (pain score 4-6).   Yes [provider]  ipratropium (ATROVENT ) 0.03 % nasal spray Place 2 sprays into both nostrils 2 (two) times daily as needed for rhinitis.  09/08/24 Yes [provider]  levothyroxine  (SYNTHROID , LEVOTHROID) 50 MCG tablet Take 50 mcg by mouth daily before  breakfast.    Yes [provider]  Multiple Vitamin (MULTIVITAMIN WITH MINERALS) TABS tablet Take 1 tablet by mouth daily.   Yes [provider]  OXYGEN  Inhale 4 L into the lungs daily.   Yes [provider]  potassium chloride  SA (KLOR-CON  M) 20 MEQ tablet Take 2 tablets (40 mEq total) by mouth daily. 05/27/24  Yes Miriam Norris, NP  rOPINIRole  (REQUIP ) 3 MG tablet Take 1 tablet (3 mg total) by mouth 2 (two) times daily. 09/23/23  Yes Shahmehdi, Seyed A, MD  rosuvastatin  (CRESTOR ) 20 MG tablet TAKE ONE (1) TABLET BY MOUTH EVERY DAY 08/03/24  Yes Miriam Norris, NP  torsemide  (DEMADEX ) 100 MG tablet Take 50 mg by mouth 2 (two) times daily. 07/06/24  Yes [provider]  TRELEGY ELLIPTA 100-62.5-25 MCG/ACT AEPB Inhale 1 puff into the lungs daily. 06/25/24  Yes [provider]  warfarin (COUMADIN ) 2 MG tablet Take 1.5-2 tablets (3-4 mg total) by mouth See admin instructions. 4mg  on Tuesday and 3 mg daily the rest of the week. 09/06/18  Yes Ricky Fines, MD  diltiazem  (CARDIZEM  CD) 120 MG 24 hr capsule Take 1 capsule (120 mg total) by mouth daily. 04/01/18   Ricky Fines, MD     Allergies:     Allergies  Allergen Reactions   Penicillin G Itching   Penicillins Hives, Itching, Swelling and Rash   Dust Mite Extract Other (See Comments)    Seasonal allergies     Physical Exam:   Vitals  Blood pressure 114/62, pulse 81, temperature 98.4 F (36.9 C), temperature source Oral, resp. rate 16, weight 68.9 kg, SpO2 96%.   1. General Frail elderly female, laying in bed, in no apparent distress, appear older than her stated age  69. Normal affect and insight, Not Suicidal or Homicidal, Awake Alert, Oriented X 3.  3. No F.N deficits, ALL C.Nerves Intact, Strength 5/5 all 4 extremities, Sensation intact all 4 extremities, Plantars down going.  4. Ears and Eyes appear Normal, Conjunctivae clear, PERRLA. Moist Oral Mucosa.  5. Supple Neck,No Carotid  Bruits.  6. Symmetrical Chest wall movement, Minister entry at the bases, right> left with crackles  7.  Irregular regular, mechanical click present  8. Positive Bowel Sounds, Abdomen Soft, No tenderness, No organomegaly appriciated,No rebound -guarding or rigidity.  9.  No Cyanosis, Normal Skin Turgor, +1 edema lower extremity with chronic lower extremity skin changes and dryness  10. Good muscle tone,  joints appear normal , no effusions, Normal ROM.    Data Review:    CBC Recent Labs  Lab 09/08/24 1909  WBC 5.9  HGB 8.9*  HCT 29.3*  PLT 211  MCV 90.2  MCH 27.4  MCHC 30.4  RDW 14.6  LYMPHSABS 0.8  MONOABS 0.7  EOSABS 0.1  BASOSABS 0.0   ------------------------------------------------------------------------------------------------------------------  Chemistries  Recent Labs  Lab 09/08/24 1909  NA 142  K 2.9*  CL 100  CO2 32  GLUCOSE 116*  BUN 24*  CREATININE 1.66*  CALCIUM  8.9  AST 29  ALT 13  ALKPHOS 91  BILITOT 0.7   ------------------------------------------------------------------------------------------------------------------ estimated creatinine clearance is 30.9 mL/min (A) (by C-G formula based on SCr of 1.66 mg/dL (H)). ------------------------------------------------------------------------------------------------------------------ No results for input(s): TSH, T4TOTAL, T3FREE, THYROIDAB in the last 72 hours.  Invalid input(s): FREET3  Coagulation profile Recent Labs  Lab 09/08/24 1909  INR 5.6*   ------------------------------------------------------------------------------------------------------------------- No results for input(s): DDIMER in the last 72 hours. -------------------------------------------------------------------------------------------------------------------  Cardiac Enzymes No results for input(s): CKMB, TROPONINI, MYOGLOBIN in the last 168 hours.  Invalid input(s):  CK ------------------------------------------------------------------------------------------------------------------    Component Value Date/Time   BNP 916.0 (H) 05/09/2024  0215     ---------------------------------------------------------------------------------------------------------------  Urinalysis    Component Value Date/Time   COLORURINE YELLOW 05/05/2024 0530   APPEARANCEUR CLEAR 05/05/2024 0530   LABSPEC 1.008 05/05/2024 0530   PHURINE 5.0 05/05/2024 0530   GLUCOSEU 150 (A) 05/05/2024 0530   HGBUR NEGATIVE 05/05/2024 0530   BILIRUBINUR NEGATIVE 05/05/2024 0530   KETONESUR NEGATIVE 05/05/2024 0530   PROTEINUR NEGATIVE 05/05/2024 0530   NITRITE NEGATIVE 05/05/2024 0530   LEUKOCYTESUR NEGATIVE 05/05/2024 0530    ----------------------------------------------------------------------------------------------------------------   Imaging Results:    DG Chest Portable 1 View Result Date: 09/08/2024 CLINICAL DATA:  Shortness of breath. EXAM: PORTABLE CHEST 1 VIEW COMPARISON:  05/05/2024 FINDINGS: Stable cardiac enlargement and prominent mediastinal and hilar contours. Tortuosity and calcification of the aorta again noted. Peribronchial thickening and increased interstitial markings likely pulmonary edema superimposed on chronic bronchitic changes. There is a chronic right pleural effusion and overlying atelectasis and or scarring. No pneumothorax. IMPRESSION: Cardiac enlargement, pulmonary edema and chronic right pleural effusion. Electronically Signed   By: MYRTIS Stammer M.D.   On: 09/08/2024 19:01    EKG: Vent. rate 98 BPM PR interval * ms QRS duration 130 ms QT/QTcB 379/484 ms P-R-T axes * 82 214 Atrial fibrillation Right bundle branch block     Assessment & Plan:    Principal Problem:   Acute on chronic diastolic CHF (congestive heart failure) (HCC) Active Problems:   Hypokalemia   Hypothyroidism   GERD   S/P AVR (aortic valve replacement)   CKD (chronic  kidney disease) stage 3, GFR 30-59 ml/min (HCC)   Supratherapeutic INR   Acute on chronic respiratory failure with hypoxia (HCC)   Permanent atrial fibrillation (HCC)   Coronary artery disease due to lipid rich plaque    Acute on chronic respiratory failure with hypoxia Acute on chronic diastolic CHF -4 L oxygen  at baseline, saturating in the low 80s on 4 L, currently on 6 L oxygen  saturating 91% - Evidence of volume overload, elevated BNP and pulmonary edema on chest x-ray - 2D echo 04/28/2024 significant for preserved EF at 60% with indeterminate diastolic parameters, but significant hypertrophy and anterior ventricular septum flattening - Started on IV Lasix  40 mg IV twice daily but given severe hypokalemia will replace initially with IV and p.o. and initiate IV diuresis in a.m. - Continue with daily weight, strict ins and out  COPD - No active wheezing, no indication for steroids or antibiotics -continue with home Trelegy Ellipta and as needed albuterol   CKD stage IIIb - Creatinine 1.66 on admission, lower baseline, likely due to volume overload, monitor closely as will be on diuresis  Hypokalemia -Potassium significantly low at 2.9, cannot tolerate IV potassium due to burning at IV site, will go at a lower rate, will give 60 mEq p.o. now, do another 20 mEq in 3 hours so we can start on IV diuresis   Atrial fibrillation  mechanical aortic valve replacement coagulopathy due to chronic anticoagulation/supratherapeutic INR -INR is elevated at 5.6, no evidence of active bleed, will hold on vitamin K . - Will consult pharmacy to dose warfarin - Continue with Cardizem  for heart rate control  Hypothyroidism -continue with Synthroid     CAD -Patient is status post 2V CABG. - Continue with home medication  Hyperlipidemia -Continue with home statin  Hypothyroidism -Continue with Synthroid   Anxiety Restless leg syndrome -Continue with home medications  Hypertension -Actually  blood pressure on the lower side, but will continue with Cardizem  for heart rate control    DVT  Prophylaxis on warfarin  AM Labs Ordered, also please review Full Orders  Family Communication: Admission, patients condition and plan of care including tests being ordered have been discussed with the patient and son who indicate understanding and agree with the plan and Code Status.  Code Status DNR/DNI  Likely DC to home  Consults called: None  Admission status: Inpatient  Time spent in minutes : 70 minutes   Brayton Lye M.D on 09/08/2024 at 9:16 PM   [facility] - Office  [phone]

## 2024-09-08 NOTE — ED Triage Notes (Signed)
 Pt has had increased SOB this week. Went to the doctor today had xray and told she had fluid build up in lungs. Pt is on 4l [place] at all times at home. Pt complains of back pain.

## 2024-09-09 ENCOUNTER — Telehealth (HOSPITAL_COMMUNITY): Payer: Self-pay

## 2024-09-09 ENCOUNTER — Other Ambulatory Visit (HOSPITAL_COMMUNITY): Payer: Self-pay

## 2024-09-09 DIAGNOSIS — I2510 Atherosclerotic heart disease of native coronary artery without angina pectoris: Secondary | ICD-10-CM | POA: Diagnosis not present

## 2024-09-09 DIAGNOSIS — I5033 Acute on chronic diastolic (congestive) heart failure: Secondary | ICD-10-CM | POA: Diagnosis not present

## 2024-09-09 DIAGNOSIS — N183 Chronic kidney disease, stage 3 unspecified: Secondary | ICD-10-CM

## 2024-09-09 DIAGNOSIS — I4819 Other persistent atrial fibrillation: Secondary | ICD-10-CM

## 2024-09-09 DIAGNOSIS — G2581 Restless legs syndrome: Secondary | ICD-10-CM

## 2024-09-09 DIAGNOSIS — I350 Nonrheumatic aortic (valve) stenosis: Secondary | ICD-10-CM

## 2024-09-09 DIAGNOSIS — F419 Anxiety disorder, unspecified: Secondary | ICD-10-CM

## 2024-09-09 DIAGNOSIS — I2583 Coronary atherosclerosis due to lipid rich plaque: Secondary | ICD-10-CM

## 2024-09-09 LAB — BASIC METABOLIC PANEL WITH GFR
Anion gap: 8 (ref 5–15)
BUN: 23 mg/dL (ref 8–23)
CO2: 32 mmol/L (ref 22–32)
Calcium: 8.5 mg/dL — ABNORMAL LOW (ref 8.9–10.3)
Chloride: 103 mmol/L (ref 98–111)
Creatinine, Ser: 1.8 mg/dL — ABNORMAL HIGH (ref 0.44–1.00)
GFR, Estimated: 29 mL/min — ABNORMAL LOW (ref >60)
Glucose, Bld: 95 mg/dL (ref 70–99)
Potassium: 4.3 mmol/L (ref 3.5–5.1)
Sodium: 142 mmol/L (ref 135–145)

## 2024-09-09 LAB — CBC
HCT: 28.5 % — ABNORMAL LOW (ref 36.0–46.0)
Hemoglobin: 8.7 g/dL — ABNORMAL LOW (ref 12.0–15.0)
MCH: 27.8 pg (ref 26.0–34.0)
MCHC: 30.5 g/dL (ref 30.0–36.0)
MCV: 91.1 fL (ref 80.0–100.0)
Platelets: 181 K/uL (ref 150–400)
RBC: 3.13 MIL/uL — ABNORMAL LOW (ref 3.87–5.11)
RDW: 14.8 % (ref 11.5–15.5)
WBC: 5.1 K/uL (ref 4.0–10.5)
nRBC: 0 % (ref 0.0–0.2)

## 2024-09-09 LAB — PROTIME-INR
INR: 5.6 (ref 0.8–1.2)
INR: 6 (ref 0.8–1.2)
Prothrombin Time: 52.8 s — ABNORMAL HIGH (ref 11.4–15.2)
Prothrombin Time: 56.1 s — ABNORMAL HIGH (ref 11.4–15.2)

## 2024-09-09 MED ORDER — FUROSEMIDE 10 MG/ML IJ SOLN
40.0000 mg | Freq: Two times a day (BID) | INTRAMUSCULAR | Status: DC
  Administered 2024-09-09 – 2024-09-12 (×6): 40 mg via INTRAVENOUS
  Filled 2024-09-09 (×6): qty 4

## 2024-09-09 MED ORDER — FUROSEMIDE 10 MG/ML IJ SOLN
40.0000 mg | Freq: Two times a day (BID) | INTRAMUSCULAR | Status: DC
  Administered 2024-09-09: 40 mg via INTRAVENOUS
  Filled 2024-09-09: qty 4

## 2024-09-09 NOTE — Assessment & Plan Note (Signed)
-   Continue home meds

## 2024-09-09 NOTE — Assessment & Plan Note (Signed)
 Continue home Synthroid

## 2024-09-09 NOTE — Evaluation (Signed)
 Physical Therapy Evaluation Patient Details Name: [name] MRN: [id number] DOB: December 27, 1950 Today's Date: 09/09/2024  History of Present Illness  [name]  is a 73 y.o. female,  with longstanding history of COPD and chronic respiratory failure typically on 4 L/min supplemental oxygen , chronic atrial fibrillation, HTN, HLD, CAD status post 2V CABG with mechanical aortic valve replacement, at baseline patient 4 L oxygen  at home.-   - Presents to ED secondary to complaints of shortness of breath, reports progressive over the last 3 days, reports initially with activity, currently reports orthopnea as well, reports compliance with her home diuretics, and salt restriction, denies cough, fever or chills, no chest pain, reports baseline lower extremity edema  - In ED saturating 85% on her 4 L nasal cannula, requiring up to 6 L, creatinine lower than baseline at 1.66, chest x-ray significant for cardiomegaly, chronic right pleural effusion and pulmonary edema, proBNP significantly elevated, INR is elevated at 5.6, potassium low at 2.9, [facility] hospitalist consulted to admit.   Clinical Impression  Patient agreeable to PT evaluation. Reports at baseline she is independent with ADLS, although having increased difficulty recently, and ambulates in home without AD. This date, patient requires moderate assist with bed mobility, total assist to donn socks, min/CGA assist with functional transfers, and ambulation without/with AD respectively. During session, pt urinates and has BM and requires max assist for LB cleaning and dressing while standing with RW for support. Pt limited most this date due to general LE weakness, SOB, and low back pain. Pt began session on baseline of 4 Lpm via [place], with SpO2 remaining at appropriate levels. Following transfer/short steps at bedside, SpO2 drops down to 84% at lowest. SpO2 eventually recovers with ~5 min of rest, increasing to 5 Lpm via [place], and pursed lip breathing. Pt  tolerates sitting in recliner at end of session, call button in reach, all needs met, and visitor in room. Patient will benefit from continued skilled physical therapy in order to address current deficits to improve overall function, independence, and QOL.        If plan is discharge home, recommend the following: A little help with walking and/or transfers;A little help with bathing/dressing/bathroom;Assist for transportation;Assistance with cooking/housework;Help with stairs or ramp for entrance   Can travel by private vehicle        Equipment Recommendations None recommended by PT  Recommendations for Other Services       Functional Status Assessment Patient has had a recent decline in their functional status and demonstrates the ability to make significant improvements in function in a reasonable and predictable amount of time.     Precautions / Restrictions Precautions Precautions: Fall Recall of Precautions/Restrictions: Intact Restrictions Weight Bearing Restrictions Per Provider Order: No      Mobility  Bed Mobility Overal bed mobility: Needs Assistance Bed Mobility: Supine to Sit     Supine to sit: Mod assist     General bed mobility comments: HOB flat, pt demo slow labored movement, assist to get LE to EOB and trunk elevation via pull to sit. groans in pain due to inc low back pain    Transfers Overall transfer level: Needs assistance Equipment used: Rolling walker (2 wheels), 1 person hand held assist Transfers: Sit to/from Stand, Bed to chair/wheelchair/BSC Sit to Stand: Min assist, Contact guard assist   Step pivot transfers: Min assist       General transfer comment: first STS from bed without AD required min assist due to LE weakness, min  assist during bed>chair without AD due LE weakness and unsteadiness with pt grabbing arm rest of chair for support throughout, 2 other STS from chair during session with RW, CGA and verbal cueing for hand placement,  and forward lean.    Ambulation/Gait Ambulation/Gait assistance: Min assist Gait Distance (Feet): 3 Feet Assistive device: 1 person hand held assist Gait Pattern/deviations: Step-to pattern, Decreased step length - right, Decreased step length - left, Trunk flexed, Antalgic, Decreased stride length Gait velocity: Dec     General Gait Details: Pt limited to a few side steps at bedside with support of PT, and using arm rests as support with other UE, no AD. Pt unsteady on feet and limited due to inc low back pain. Demo very short antalgic steps  Stairs            Wheelchair Mobility     Tilt Bed    Modified Rankin (Stroke Patients Only)       Balance Overall balance assessment: Needs assistance Sitting-balance support: Feet supported, Bilateral upper extremity supported Sitting balance-Leahy Scale: Fair Sitting balance - Comments: seated EOB   Standing balance support: During functional activity, Single extremity supported, Bilateral upper extremity supported Standing balance-Leahy Scale: Fair Standing balance comment: poor without RW, fair with RW         Pertinent Vitals/Pain Pain Assessment Pain Assessment: 0-10 Pain Score: 7  Pain Location: Low back pain Pain Descriptors / Indicators: Grimacing, Moaning Pain Intervention(s): Limited activity within patient's tolerance, Repositioned, Monitored during session    Home Living Family/patient expects to be discharged to:: Private residence Living Arrangements: Children Available Help at Discharge: Family;Available PRN/intermittently Type of Home: Mobile home Home Access: Stairs to enter Entrance Stairs-Rails: Can reach both Entrance Stairs-Number of Steps: 2   Home Layout: One level Home Equipment: Agricultural Consultant (2 wheels);BSC/3in1;Wheelchair - manual;Shower seat;Grab bars - tub/shower Additional Comments: Pt reports no change in home set up since last admission. Son only available PRN.    Prior Function  Prior Level of Function : Independent/Modified Independent             Mobility Comments: Reports as household ambulator without AD, reports no falls, does not drive ADLs Comments: Reports independence with ADL but having inc difficulty recently, son assists with iADLS     Extremity/Trunk Assessment   Upper Extremity Assessment Upper Extremity Assessment: Defer to OT evaluation (Pt reports her arms feel sore and weak not formally tested)    Lower Extremity Assessment Lower Extremity Assessment: Generalized weakness (pt generally weak throughout. requires physical assist throughout all mobility. 4-/5 at best grossly)    Cervical / Trunk Assessment Cervical / Trunk Assessment: Kyphotic  Communication   Communication Communication: No apparent difficulties    Cognition Arousal: Alert, Lethargic Behavior During Therapy: WFL for tasks assessed/performed         PT - Cognition Comments: Initially lethargic, alertness improves with outside stimulation (ie. lights, movement) Following commands: Intact       Cueing Cueing Techniques: Verbal cues, Tactile cues     General Comments      Exercises     Assessment/Plan    PT Assessment Patient needs continued PT services;All further PT needs can be met in the next venue of care  PT Problem List Decreased strength;Decreased range of motion;Decreased activity tolerance;Decreased balance;Decreased mobility;Pain       PT Treatment Interventions DME instruction;Gait training;Stair training;Functional mobility training;Therapeutic activities;Therapeutic exercise;Patient/family education;Balance training    PT Goals (Current goals can be found in the  Care Plan section)  Acute Rehab PT Goals Patient Stated Goal: Return home PT Goal Formulation: With patient Time For Goal Achievement: 09/23/24 Potential to Achieve Goals: Good    Frequency Min 3X/week     Co-evaluation               AM-PAC PT 6 Clicks Mobility   Outcome Measure Help needed turning from your back to your side while in a flat bed without using bedrails?: A Little Help needed moving from lying on your back to sitting on the side of a flat bed without using bedrails?: A Lot Help needed moving to and from a bed to a chair (including a wheelchair)?: A Little Help needed standing up from a chair using your arms (e.g., wheelchair or bedside chair)?: A Little Help needed to walk in hospital room?: A Little Help needed climbing 3-5 steps with a railing? : A Lot 6 Click Score: 16    End of Session   Activity Tolerance: Patient limited by pain;Patient tolerated treatment well Patient left: in chair;with call bell/phone within reach;with family/visitor present Nurse Communication: Mobility status PT Visit Diagnosis: Unsteadiness on feet (R26.81);Other abnormalities of gait and mobility (R26.89);Muscle weakness (generalized) (M62.81);Difficulty in walking, not elsewhere classified (R26.2);Pain Pain - part of body:  (low back)    Time: 8588-8547 PT Time Calculation (min) (ACUTE ONLY): 41 min   Charges:   PT Evaluation $PT Eval Moderate Complexity: 1 Mod PT Treatments $Therapeutic Activity: 8-22 mins PT General Charges $$ ACUTE PT VISIT: 1 Visit        3:31 PM, 09/09/24 Kendelle Schweers Powell-Butler, PT, DPT [place] with [facility]

## 2024-09-09 NOTE — Assessment & Plan Note (Signed)
 Heart rate currently controlled. - Continue with home Cardizem  -Coumadin  is currently being held due to supratherapeutic INR

## 2024-09-09 NOTE — Consult Note (Signed)
 PHARMACY - ANTICOAGULATION CONSULT NOTE  Pharmacy Consult for Warfarin Indication: mAVR & Afib  Allergies  Allergen Reactions   Penicillin G Itching   Penicillins Hives, Itching, Swelling and Rash   Dust Mite Extract Other (See Comments)    Seasonal allergies    Patient Measurements: Weight: 68.9 kg (151 lb 14.4 oz)  Vital Signs: Temp: 97.8 F (36.6 C) (11/21 0403) Temp Source: Oral (11/21 0403) BP: 115/73 (11/21 0403) Pulse Rate: 76 (11/21 0403)  Labs: Recent Labs    09/08/24 1909 09/09/24 0448  HGB 8.9* 8.7*  HCT 29.3* 28.5*  PLT 211 181  LABPROT 52.8* 56.1*  INR 5.6* 6.0*  CREATININE 1.66* 1.80*    Estimated Creatinine Clearance: 28.5 mL/min (A) (by C-G formula based on SCr of 1.8 mg/dL (H)).   Medical History: Past Medical History:  Diagnosis Date   Anxiety disorder    Aortic stenosis    Status post [employer] mechanical AVR 2007   Asthma    Atrial fibrillation (HCC)    Carcinoid tumor of colon (HCC) 2007   Chronic diastolic heart failure (HCC)    Coronary atherosclerosis of native coronary artery    Status post CABG 2007   GERD (gastroesophageal reflux disease)    History of colonoscopy 2003   Dr. 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 Shaaron, benign biopsies   RLS (restless legs syndrome)    Schatzki's ring     Medications:  Warfarin 3 mg daily except for 4 mg on Tuesday (TWD: 22 mg)  Last patient reported dose was 11/20 at noon  Assessment: 73 y/o F with medical history as above presenting with shortness of breath and CXR impression of pulmonary edema. Pharmacy consulted to dose warfarin while patient is admitted.  INR today is 5.6> 6.  Supratherapeutic, no bleeding noted(Warfarin had already been taken by patient yesterday morning)  Goal of Therapy:  INR 2.5 to 3.5 with mAVR and Afib Monitor platelets by anticoagulation protocol: Yes   Plan:  No  coumadin  today PT INR daily Monitor for S/S of bleeding  Cherlyn Boers, BS Pharm D, BCPS Clinical Pharmacist 09/09/2024,7:47 AM

## 2024-09-09 NOTE — TOC Initial Note (Addendum)
 Transition of Care [facility]) - Initial/Assessment Note    Patient Details  Name: [name] MRN: [id number] Date of Birth: May 17, 1951  Transition of Care [facility]) CM/SW Contact:    Hoy DELENA Bigness, LCSW Phone Number: 09/09/2024, 3:45 PM  Clinical Narrative:                 Pt assessed due to high risk for readmission. PT is from home with son. Pt is chronically on 4L O2 provided by [employer]. Pt has wheelchair, RW, and cane at home. Pt ambulates with cane majority of the time. Pt's son assists as needed and provides transportation for pt.  CSW reviewed recommendation for SNF placement with pt. Pt reports being to SNF 4x in the past and declines SNF placement at this time. Pt is agreeable to have HH arranged and denies having HH in the past. HHPT has been arranged with [facility].   Expected Discharge Plan: Home w Home Health Services Barriers to Discharge: Continued Medical Work up   Patient Goals and CMS Choice Patient states their goals for this hospitalization and ongoing recovery are:: To return home CMS Medicare.gov Compare Post Acute Care list provided to:: Patient Choice offered to / list presented to : Patient      Expected Discharge Plan and Services In-house Referral: Clinical Social Work Discharge Planning Services: NA Post Acute Care Choice: Home Health Living arrangements for the past 2 months: Single Family Home                 DME Arranged: N/A DME Agency: NA                  Prior Living Arrangements/Services Living arrangements for the past 2 months: Single Family Home Lives with:: Adult Children Patient language and need for interpreter reviewed:: Yes Do you feel safe going back to the place where you live?: Yes      Need for Family Participation in Patient Care: No (Comment) Care giver support system in place?: Yes (comment) Current home services: DME (wheelchair, RW, cane, O2 w/ [employer]) Criminal Activity/Legal Involvement Pertinent to Current  Situation/Hospitalization: No - Comment as needed  Activities of Daily Living   ADL Screening (condition at time of admission) Independently performs ADLs?: No Does the patient have a NEW difficulty with bathing/dressing/toileting/self-feeding that is expected to last >3 days?: No Does the patient have a NEW difficulty with getting in/out of bed, walking, or climbing stairs that is expected to last >3 days?: No Does the patient have a NEW difficulty with communication that is expected to last >3 days?: No Is the patient deaf or have difficulty hearing?: No Does the patient have difficulty seeing, even when wearing glasses/contacts?: No Does the patient have difficulty concentrating, remembering, or making decisions?: No  Permission Sought/Granted Permission sought to share information with : Facility Medical Sales Representative, Family Supports Permission granted to share information with : Yes, Verbal Permission Granted  Share Information with NAME: Alm Mt  Permission granted to share info w AGENCY: [facility]        Emotional Assessment Appearance:: Appears stated age Attitude/Demeanor/Rapport: Engaged Affect (typically observed): Appropriate Orientation: : Oriented to Self, Oriented to Place, Oriented to  Time, Oriented to Situation Alcohol / Substance Use: Not Applicable Psych Involvement: No (comment)  Admission diagnosis:  Hypokalemia [E87.6] Acute on chronic diastolic CHF (congestive heart failure) (HCC) [I50.33] Acute on chronic congestive heart failure, unspecified heart failure type [facility]) [I50.9] Patient Active Problem List   Diagnosis Date Noted  Anxiety disorder    RLS (restless legs syndrome)    Acute on chronic diastolic CHF (congestive heart failure) (HCC) 09/08/2024   Permanent atrial fibrillation (HCC) 05/09/2024   Left atrial mass 05/09/2024   AKI (acute kidney injury) 05/09/2024   Coronary artery disease due to lipid rich plaque 05/09/2024   Acute heart  failure with preserved ejection fraction (HFpEF) (HCC) 05/09/2024   Acute respiratory failure with hypoxia and hypercapnia (HCC) 05/09/2024   Acute on chronic respiratory failure (HCC) 05/05/2024   Acute on chronic respiratory failure with hypoxia (HCC) 09/09/2023   Rib fractures 06/11/2023   Right tibial fracture 06/11/2023   Acute diastolic CHF (congestive heart failure) (HCC) 05/29/2019   Itching 05/29/2019   Acute hypokalemia 05/05/2019   Acute CHF (congestive heart failure) (HCC) 04/08/2019   Acute CHF (HCC) 04/07/2019   Asthma, chronic, unspecified asthma severity, with acute exacerbation 04/07/2019   Benign essential HTN    CHF (congestive heart failure) (HCC) 07/26/2018   Transaminitis 07/23/2018   Acute on chronic respiratory failure with hypoxia and hypercapnia (HCC) 06/19/2018   Acute respiratory failure with hypoxia (HCC)    Supratherapeutic INR 05/03/2018   CHF exacerbation (HCC) 05/02/2018   Acute metabolic encephalopathy 05/02/2018   CKD (chronic kidney disease) stage 3, GFR 30-59 ml/min (HCC) 05/02/2018   Hypothyroidism 05/02/2018   Generalized weakness 05/02/2018   Physical deconditioning    Coronary artery disease of bypass graft of native heart with stable angina pectoris    Renal failure (ARF), acute on chronic    Acute on chronic respiratory failure with hypercapnia (HCC) 03/24/2018   Hypomagnesemia 03/24/2018   Hypokalemia 03/24/2018   Hypophosphatemia 03/24/2018   Atrial fibrillation with RVR (HCC) 03/24/2018   Elevated troponin 03/24/2018   Coughing 01/14/2018   Anemia 01/14/2018   Hx of adenomatous colonic polyps 01/14/2018   Acute on chronic diastolic congestive heart failure (HCC) 12/17/2016   COPD with acute exacerbation (HCC) 12/17/2016   Bilateral leg edema 07/05/2014   Encounter for therapeutic drug monitoring 11/15/2013   Chronic venous insufficiency 08/26/2011   Chronic anticoagulation 01/11/2011   Peptic stricture of esophagus 10/04/2010    GERD 09/16/2010   DYSPHAGIA 09/16/2010   S/P AVR (aortic valve replacement) 06/05/2009   Hyperlipidemia 06/04/2009   Coronary atherosclerosis of native coronary artery 06/04/2009   Atrial fibrillation, chronic (HCC) 06/04/2009   PCP:  Renato Dorothey HERO, NP Pharmacy:   [facility] [place] [place] - [address] [place] [address] Phone: [phone] Fax: [phone]     Social Drivers of Health (SDOH) Social History: SDOH Screenings   Food Insecurity: No Food Insecurity (09/08/2024)  Housing: Unknown (09/08/2024)  Transportation Needs: No Transportation Needs (09/08/2024)  Utilities: Not At Risk (09/08/2024)  Financial Resource Strain: Low Risk (12/27/2023)   Received from [facility]  Physical Activity: Inactive (12/27/2023)   Received from [facility]  Social Connections: Moderately Isolated (09/08/2024)  Stress: Stress Concern Present (12/27/2023)   [facility] from [facility]  Tobacco Use: Medium Risk (09/08/2024)  Health Literacy: Low Risk (05/18/2024)   Received from [facility]   SDOH Interventions:     Readmission Risk Interventions    09/09/2024    3:42 PM 09/10/2023    2:02 PM  Readmission Risk Prevention Plan  Transportation Screening Complete Complete  PCP or Specialist Appt within 3-5 Days Complete   HRI or Home Care Consult Complete Complete  Social Work Consult for Recovery Care Planning/Counseling Complete  Complete  Palliative Care Screening Not Applicable Not Applicable  Medication Review (RN Care Manager) Complete Complete

## 2024-09-09 NOTE — Assessment & Plan Note (Signed)
 Continue Requip.

## 2024-09-09 NOTE — Progress Notes (Signed)
 Progress Note   Patient: [name] FMW:[id number] DOB: Jan 10, 1951 DOA: 09/08/2024     1 DOS: the patient was seen and examined on 09/09/2024   Brief hospital course: Partly taken from H&P.  [name]  is a 73 y.o. female,  with longstanding history of COPD and chronic respiratory failure typically on 4 L/min supplemental oxygen , chronic atrial fibrillation, HTN, HLD, CAD status post 2V CABG with mechanical aortic valve replacement, presented to ED with complaints of worsening shortness of breath over the last 3 days, also reports orthopnea.  Per patient she was compliant with her home diuretics and salt restrictions.  On presentation she was hypoxic at 85% and requiring up to 6 L of oxygen .  Chest x-ray with cardiomegaly, chronic right pleural effusion and pulmonary edema.  Significantly elevated proBNP at 5004, INR elevated at 5.6, potassium at 2.9.  Patient was admitted for acute on chronic hypoxic respiratory failure secondary to acute on chronic HFpEF.  Started on IV diuresis.  Cardiology was consulted.  11/21: Vital stable on 6 L of oxygen , slight worsening of creatinine to 1.80, worsening of INR to 6.0, hemoglobin 8.7.  Lasix  is being started this morning as potassium was being repleted on admission.  Patient also has a possible LA mass which was noted on prior echoes.  Patient did not pursue TEE or further investigation.  Assessment and Plan: * Acute on chronic diastolic CHF (congestive heart failure) (HCC) 2D echo 04/28/2024 significant for preserved EF at 60% with indeterminate diastolic parameters, but significant hypertrophy and anterior ventricular septum flattening. proBNP significantly elevated at 5004, chest x-ray with pulmonary vascular congestion and pleural effusion. Cardiology was consulted and patient was started on IV Lasix  but never received until earlier today, as potassium was being repleted. -Continue with IV Lasix  40 mg twice daily -Daily weight and  BMP -Strict intake and output  Acute on chronic respiratory failure with hypoxia (HCC) Patient was using up to 6 L of oxygen  on admission, history of 4 L at baseline. - Continue with supplemental oxygen -wean to baseline as tolerated  Hypokalemia Improved with repletion. - Continue to monitor and replete as needed while she is being diuresed  CKD (chronic kidney disease) stage 3, GFR 30-59 ml/min (HCC) Patient with history of CKD stage IIIb, slight increase in creatinine but remained within her baseline. - Monitor renal function -Avoid nephrotoxins  Permanent atrial fibrillation (HCC) Heart rate currently controlled. - Continue with home Cardizem  -Coumadin  is currently being held due to supratherapeutic INR  S/P AVR (aortic valve replacement) INR supratherapeutic at 6.0 with goal of 2.5-3.5 due to mechanical aortic valve. - Coumadin  per pharmacy  Supratherapeutic INR INR at 6.0.  No concern of bleeding at this time. - Appreciate pharmacy help with monitoring -Keep holding Coumadin   Coronary artery disease due to lipid rich plaque S/p CABG in 2007.  No chest pain. - Continue with home statin -Not on any antiplatelet due to the need for anticoagulation with Coumadin   Hypothyroidism - Continue home Synthroid   GERD - Continue with PPI  Anxiety disorder - Continue home meds  RLS (restless legs syndrome) - Continue Requip    Subjective: Patient was seen and examined today.  Denies any shortness of breath, still feeling some fullness in belly.  No chest pain.  Son at bedside  Physical Exam: Vitals:   09/09/24 0403 09/09/24 0500 09/09/24 0859 09/09/24 1000  BP: 115/73  109/66   Pulse: 76     Resp: (!) 26  18   Temp:  97.8 F (36.6 C)   98 F (36.7 C)  TempSrc: Oral   Oral  SpO2: 95%  94%   Weight:  68.9 kg     General.  Frail elderly lady, in no acute distress. Pulmonary.  Few basal crackles bilaterally, normal respiratory effort. CV.  Irregularly irregular   Abdomen.  Soft, nontender, nondistended, BS positive. CNS.  Alert and oriented .  No focal neurologic deficit. Extremities.  No edema, pulses intact and symmetrical.   Data Reviewed: Prior data reviewed  Family Communication: Discussed with son at bedside  Disposition: Status is: Inpatient Remains inpatient appropriate because: Severity of illness  Planned Discharge Destination: Home  DVT prophylaxis.  Coumadin -currently being held due to supratherapeutic INR Time spent: 50 minutes  This record has been created using Conservation officer, historic buildings. Errors have been sought and corrected,but may not always be located. Such creation errors do not reflect on the standard of care.   Author: Amaryllis Dare, MD 09/09/2024 12:57 PM  For on call review [email or web address].

## 2024-09-09 NOTE — Progress Notes (Signed)
 Pt called out reporting she is having difficulty breathing. SPO2 87 on 6 L [place]. Diminished wheezes in bilateral lung bases. She is tachypneic with rate of  25. She is requesting breathing treatment. Albuterol  administered and respiratory  therapy and charge RN notified. [place] RN

## 2024-09-09 NOTE — Assessment & Plan Note (Signed)
 Improved with repletion. - Continue to monitor and replete as needed while she is being diuresed

## 2024-09-09 NOTE — Plan of Care (Signed)
  Problem: Acute Rehab PT Goals(only PT should resolve) Goal: Pt Will Go Supine/Side To Sit Outcome: Progressing Flowsheets (Taken 09/09/2024 1532) Pt will go Supine/Side to Sit: with minimal assist Goal: Patient Will Transfer Sit To/From Stand Outcome: Progressing Flowsheets (Taken 09/09/2024 1532) Patient will transfer sit to/from stand: with supervision Goal: Pt Will Transfer Bed To Chair/Chair To Bed Outcome: Progressing Flowsheets (Taken 09/09/2024 1532) Pt will Transfer Bed to Chair/Chair to Bed: with supervision Goal: Pt Will Ambulate Outcome: Progressing Flowsheets (Taken 09/09/2024 1532) Pt will Ambulate:  10 feet  with least restrictive assistive device  with rolling walker  with supervision Goal: Pt Will Go Up/Down Stairs Outcome: Progressing Flowsheets (Taken 09/09/2024 1532) Pt will Go Up / Down Stairs:  1-2 stairs  with rail(s)  with supervision    3:33 PM, 09/09/24 Rosaria Settler, PT, DPT [place] with [facility]

## 2024-09-09 NOTE — Assessment & Plan Note (Signed)
 Patient was using up to 6 L of oxygen  on admission, history of 4 L at baseline. - Continue with supplemental oxygen -wean to baseline as tolerated

## 2024-09-09 NOTE — Hospital Course (Addendum)
 Partly taken from H&P.  [name]  is a 73 y.o. female,  with longstanding history of COPD and chronic respiratory failure typically on 4 L/min supplemental oxygen , chronic atrial fibrillation, HTN, HLD, CAD status post 2V CABG with mechanical aortic valve replacement, presented to ED with complaints of worsening shortness of breath over the last 3 days, also reports orthopnea.  Per patient she was compliant with her home diuretics and salt restrictions.  On presentation she was hypoxic at 85% and requiring up to 6 L of oxygen .  Chest x-ray with cardiomegaly, chronic right pleural effusion and pulmonary edema.  Significantly elevated proBNP at 5004, INR elevated at 5.6, potassium at 2.9.  Patient was admitted for acute on chronic hypoxic respiratory failure secondary to acute on chronic HFpEF.  Started on IV diuresis.  Cardiology was consulted.  11/21: Vital stable on 6 L of oxygen , slight worsening of creatinine to 1.80, worsening of INR to 6.0, hemoglobin 8.7.  Lasix  is being started this morning as potassium was being repleted on admission.  Patient also has a possible LA mass which was noted on prior echoes.  Patient did not pursue TEE or further investigation.

## 2024-09-09 NOTE — Plan of Care (Signed)

## 2024-09-09 NOTE — Assessment & Plan Note (Signed)
 2D echo 04/28/2024 significant for preserved EF at 60% with indeterminate diastolic parameters, but significant hypertrophy and anterior ventricular septum flattening. proBNP significantly elevated at 5004, chest x-ray with pulmonary vascular congestion and pleural effusion. Cardiology was consulted and patient was started on IV Lasix  but never received until earlier today, as potassium was being repleted. -Continue with IV Lasix  40 mg twice daily -Daily weight and BMP -Strict intake and output

## 2024-09-09 NOTE — Consult Note (Addendum)
 Cardiology Consultation   Patient ID: TONYETTA BERKO MRN: [id number]; DOB: May 14, 1951  Admit date: 09/08/2024 Date of Consult: 09/09/2024  PCP:  Renato Dorothey HERO, NP   [facility] Providers Cardiologist:  Jayson Sierras, MD   {  Patient Profile:  NEFTALI THUROW is a 73 y.o. female with a hx of aortic stenosis (s/p St. Jude mechanical AVR in 2007 with repair of ascending aortic aneurysm), CAD with prior CABG in 2007, permanent atrial fibrillation, chronic HFpEF, HTN, HLD, Stage 3 CKD, possible LA mass (noted on echo in 08/2023 and outpatient Cardiac CT recommended once clinically stable but never obtained), COPD and chronic hypoxic respiratory failure who is being seen 09/09/2024 for the evaluation of acute HFpEF at the request of Dr. Sherlon.  History of Present Illness:  Ms. Pates was admitted to [facility] in 04/2024 for acute hypoxic respiratory failure in the setting of a COPD and CHF exacerbation. Repeat echocardiogram showed a preserved EF of 60 to 65% with elevated PASP and she was noted to have a possible left atrial mass which been noted on prior echocardiogram imaging. She was not felt to be a candidate for TEE given her respiratory status and could consider a Coronary CTA as an outpatient based off her respiratory status and renal function. Weight at the time of discharge had declined to 136 lbs and she was discharged to [facility].  She has since been evaluated by Almarie Crate, NP three times since hospital discharge with most recent office visit being on 08/22/2024. Reported overall feeling well at that time and denied any chest pain or shortness of breath. Her weight had trended up to 152 lbs but notes mentioned that she appeared euvolemic on examination. Options in regard to further evaluation of her left atrial mass were reviewed and the patient did not wish to undergo further testing then. She was continued on Torsemide  60 mg twice daily,  Cardizem  CD 120 mg daily and Coumadin  for anticoagulation.  She presented to [facility] yesterday evening for evaluation of worsening shortness of breath over the past week. In talking the patient today, she reports worsening shortness of breath at rest and with activity over the past 3 to 4 days. Says that she had noticed her weight has increased over the past few months but thought this might be secondary to dietary changes as she was not consuming much while at SNF and her appetite has improved. Says that her weight was previously in the 130's and is now in the 150's. She has noticed lower extremity edema and abdominal distention. No specific orthopnea or PND but uses supplemental oxygen  at night and sleeps on her side. She is unsure of her current medications, including her diuretic. Says that she takes 2 fluid pills a day. I reached out to her pharmacy and they reported that Torsemide  100 mg daily was last filled on 07/06/2024 for a 30-day supply (listed as taking 50mg  BID prior to admission).  Initial labs showed WBC 5.9, Hgb 8.9 (close to baseline), platelets 211, Na+ 142, K+ 2.9 and creatinine 1.66 (previously 1.87 in 04/2024 but had improved to 1.26 when checked in 05/2024 by review of Care Everywhere). proBNP elevated at 5004. INR 5.6. CXR showed cardiac enlargement, pulmonary edema and chronic right pleural effusion. EKG shows baseline artifact but appears most consistent with rate-controlled atrial fibrillation, heart rate 98.  She was admitted for further management of acute on chronic hypoxic respiratory failure. IV Lasix  40 mg twice daily  was ordered but she did not receive this until 0300 as it was recommended to correct her potassium before this was administered. I&O's not recorded as of yet. Repeat labs today show creatinine is at 1.80 and potassium at 4.3. INR 6.0.   Past Medical History:  Diagnosis Date   Anxiety disorder    Aortic stenosis    Status post St. Jude mechanical AVR  2007   Asthma    Atrial fibrillation (HCC)    Carcinoid tumor of colon (HCC) 2007   Chronic diastolic heart failure (HCC)    Coronary atherosclerosis of native coronary artery    Status post CABG 2007   GERD (gastroesophageal reflux disease)    History of colonoscopy 2003   Dr. 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 Shaaron, benign biopsies   RLS (restless legs syndrome)    Schatzki's ring     Past Surgical History:  Procedure Laterality Date   ABDOMINAL HYSTERECTOMY     AORTIC VALVE REPLACEMENT  2007   #25 mm St. Jude mechanical prosthesis with Hemashield tube graft repair of ascending aneurysm   APPENDECTOMY  2007   BACK SURGERY     lumbar 4 and 5    BIOPSY  02/05/2012   RMR:Two tongues of salmon-colored epithelium distal esophagus, very short-segment Barrett's s/p bx/Small hiatal hernia, otherwise normal stomach, D1, D2. Status post esophageal dilation. Biopsy showed GERD.   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 Shaaron- schatzki's ring, shoft peptic stricture at GE junction.   ESOPHAGOGASTRODUODENOSCOPY (EGD) WITH PROPOFOL  N/A 02/08/2018   Procedure: ESOPHAGOGASTRODUODENOSCOPY (EGD) WITH PROPOFOL ;  Surgeon: Shaaron Lamar HERO, MD;  Location: [facility];  Service: Endoscopy;  Laterality: N/A;  8:15am   FOOT SURGERY     bilateral bunionectomy   HERNIA REPAIR     with mesh   LAPAROTOMY  2007   small bowel resection secondary to small bowel obstruction   MALONEY DILATION  02/05/2012   Procedure: AGAPITO DILATION;  Surgeon: Lamar HERO Shaaron, MD;  Location: [facility];   Service: Endoscopy;  Laterality: N/A;  56mm    MALONEY DILATION N/A 02/08/2018   Procedure: AGAPITO DILATION;  Surgeon: Shaaron Lamar HERO, MD;  Location: [facility];  Service: Endoscopy;  Laterality: N/A;   ORIF TIBIA PLATEAU Right 06/18/2023   Procedure: OPEN REDUCTION INTERNAL FIXATION (ORIF) TIBIAL PLATEAU;  Surgeon: Onesimo Oneil LABOR, MD;  Location: [facility];  Service: Orthopedics;  Laterality: Right;   Teeth removal       Home Medications:  Prior to Admission medications   Medication Sig Start Date End Date Taking? Authorizing Provider  albuterol  (VENTOLIN  HFA) 108 (90 Base) MCG/ACT inhaler Inhale 1-2 puffs into the lungs daily as needed for wheezing or shortness of breath. 01/19/24  Yes [provider]  ALPRAZolam  (XANAX ) 0.5 MG tablet Take 1 tablet (0.5 mg total) by mouth 2 (two) times daily. 05/10/24  Yes Ricky Fines, MD  CAPLYTA  42 MG capsule Take 42 mg by mouth at bedtime.  12/09/23  Yes [provider]  diltiazem  (TIAZAC ) 120 MG 24 hr capsule Take 120 mg by mouth daily. 08/09/24  Yes [provider]  HYDROcodone -acetaminophen  (NORCO) 10-325 MG tablet Take 1 tablet by mouth every 6 (six) hours as needed for moderate pain (pain score 4-6).   Yes [provider]  ipratropium (ATROVENT ) 0.03 % nasal spray Place 2 sprays into both nostrils 2 (two) times daily as needed for rhinitis.  09/08/24 Yes [provider]  levothyroxine  (SYNTHROID , LEVOTHROID) 50 MCG tablet Take 50 mcg by mouth daily before breakfast.    Yes [provider]  Multiple Vitamin (MULTIVITAMIN WITH MINERALS) TABS tablet Take 1 tablet by mouth daily.   Yes [provider]  OXYGEN  Inhale 4 L into the lungs daily.   Yes [provider]  potassium chloride  SA (KLOR-CON  M) 20 MEQ tablet Take 2 tablets (40 mEq total) by mouth daily. 05/27/24  Yes Miriam Norris, NP  rOPINIRole  (REQUIP ) 3 MG tablet Take 1 tablet (3 mg total) by mouth 2 (two) times daily. 09/23/23   Yes Shahmehdi, Adriana LABOR, MD  rosuvastatin  (CRESTOR ) 20 MG tablet TAKE ONE (1) TABLET BY MOUTH EVERY DAY 08/03/24  Yes Miriam Norris, NP  torsemide  (DEMADEX ) 100 MG tablet Take 50 mg by mouth 2 (two) times daily. 07/06/24  Yes [provider]  TRELEGY ELLIPTA 100-62.5-25 MCG/ACT AEPB Inhale 1 puff into the lungs daily. 06/25/24  Yes [provider]  warfarin (COUMADIN ) 2 MG tablet Take 1.5-2 tablets (3-4 mg total) by mouth See admin instructions. 4mg  on Tuesday and 3 mg daily the rest of the week. 09/06/18  Yes Ricky Fines, MD  diltiazem  (CARDIZEM  CD) 120 MG 24 hr capsule Take 1 capsule (120 mg total) by mouth daily. 04/01/18   Ricky Fines, MD    Scheduled Meds:  ALPRAZolam   0.5 mg Oral BID   budesonide -glycopyrrolate -formoterol   2 puff Inhalation BID   diltiazem   120 mg Oral Daily   furosemide   40 mg Intravenous BID   levothyroxine   50 mcg Oral Q0600   lumateperone  tosylate  42 mg Oral QHS   multivitamin with minerals  1 tablet Oral Daily   potassium chloride  SA  40 mEq Oral Daily   rOPINIRole   3 mg Oral BID   rosuvastatin   20 mg Oral Daily   Continuous Infusions:  PRN Meds: acetaminophen  **OR** acetaminophen , albuterol , HYDROcodone -acetaminophen   Allergies:    Allergies  Allergen Reactions   Penicillin G Itching   Penicillins Hives, Itching, Swelling and Rash   Dust Mite Extract Other (See Comments)    Seasonal allergies    Social History:   Social History   Socioeconomic History   Marital status: Married    Spouse name: Not on file   Number of children: 1   Years of education: stopped in 10th grade   Highest education level: GED or equivalent  Occupational History   Occupation: Retired  Tobacco Use   Smoking status: Former    Current packs/day: 0.00    Average packs/day: 1 pack/day for 20.0 years (20.0 ttl pk-yrs)    Types: Cigarettes    Start date: 10/20/1968    Quit date: 10/20/1988    Years since quitting: 35.9   Smokeless tobacco: Never   Vaping Use   Vaping status: Never Used  Substance and Sexual Activity   Alcohol use: No    Alcohol/week: 0.0 standard drinks of alcohol   Drug use: No   Sexual activity: Yes    Partners: Male  Birth control/protection: None    Comment: spouse  Other Topics Concern   Not on file  Social History Narrative   Not on file   Social Drivers of Health   Financial Resource Strain: Low Risk (12/27/2023)   Received from [facility]   Overall Financial Resource Strain (CARDIA)    Difficulty of Paying Living Expenses: Not hard at all  Food Insecurity: No Food Insecurity (09/08/2024)   Hunger Vital Sign    Worried About Running Out of Food in the Last Year: Never true    Ran Out of Food in the Last Year: Never true  Transportation Needs: No Transportation Needs (09/08/2024)   PRAPARE - Administrator, Civil Service (Medical): No    Lack of Transportation (Non-Medical): No  Physical Activity: Inactive (12/27/2023)   Received from [facility]   Exercise Vital Sign    On average, how many days per week do you engage in moderate to strenuous exercise (like a brisk walk)?: 0 days    On average, how many minutes do you engage in exercise at this level?: 0 min  Stress: Stress Concern Present (12/27/2023)   Received from [facility] - Occupational Stress Questionnaire    Feeling of Stress : Rather much  Social Connections: Moderately Isolated (09/08/2024)   Social Connection and Isolation Panel    Frequency of Communication with Friends and Family: More than three times a week    Frequency of Social Gatherings with Friends and Family: Three times a week    Attends Religious Services: Never    Active Member of Clubs or Organizations: No    Attends Banker Meetings: Never    Marital Status: Married  Catering Manager Violence: Not At Risk (09/08/2024)   Humiliation, Afraid, Rape, and Kick questionnaire    Fear of  Current or Ex-Partner: No    Emotionally Abused: No    Physically Abused: No    Sexually Abused: No    Family History:    Family History  Problem Relation Age of Onset   Stroke Mother    Cancer Father    Anesthesia problems Neg Hx    Hypotension Neg Hx    Malignant hyperthermia Neg Hx    Pseudochol deficiency Neg Hx      ROS:  Please see the history of present illness.   All other ROS reviewed and negative.     Physical Exam/Data: Vitals:   09/09/24 0403 09/09/24 0500 09/09/24 0859 09/09/24 1000  BP: 115/73  109/66   Pulse: 76     Resp: (!) 26  18   Temp: 97.8 F (36.6 C)   98 F (36.7 C)  TempSrc: Oral   Oral  SpO2: 95%  94%   Weight:  68.9 kg      Intake/Output Summary (Last 24 hours) at 09/09/2024 1016 Last data filed at 09/09/2024 0617 Gross per 24 hour  Intake 1160 ml  Output 175 ml  Net 985 ml      09/09/2024    5:00 AM 09/08/2024    6:17 PM 08/22/2024    2:10 PM  Last 3 Weights  Weight (lbs) 151 lb 14.4 oz 151 lb 14.4 oz 152 lb  Weight (kg) 68.9 kg 68.9 kg 68.947 kg     Body mass index is 23.1 kg/m.  General: Pleasant, elderly female appearing in no acute distress. HEENT: normal Neck: no JVD Vascular: No carotid bruits; Distal pulses  2+ bilaterally Cardiac:  normal S1, S2; Irregularly irregular.  Crisp mechanical valve sounds noted. Lungs: Rales along bases bilaterally Abd: soft, nontender, no hepatomegaly  Ext: 1+ pitting edema bilaterally. Multiple Band-Aids along legs bilaterally and patient reports she applies these due to dermatitis and frequent itching. Musculoskeletal:  No deformities, BUE and BLE strength normal and equal Skin: warm and dry  Neuro:  CNs 2-12 intact, no focal abnormalities noted Psych:  Normal affect   EKG:  The EKG was personally reviewed and demonstrates: Baseline artifact but appears most consistent with rate controlled atrial fibrillation, heart rate 98.  Telemetry:  Telemetry was personally reviewed and  demonstrates: Atrial fibrillation, heart rate in the 70's. Occasional PVC's.  Relevant CV Studies:  Echocardiogram: 04/2024 IMPRESSIONS     1. Left ventricular ejection fraction, by estimation, is 60 to 65%. The  left ventricle has normal function. The left ventricle has no regional  wall motion abnormalities. There is mild left ventricular hypertrophy.  Left ventricular diastolic parameters  are indeterminate. There is the interventricular septum is flattened in  systole and diastole, consistent with right ventricular pressure and  volume overload.   2. Right ventricular systolic function was not well visualized. The right  ventricular size is not well visualized. There is moderately elevated  pulmonary artery systolic pressure.   3. 3.4 x 2.4 cm round density adjacent to the interatrial septum within  the left atrium, limited visualization. Consider TEE or cardiac CT for  further evaluation. . Left atrial size was severely dilated.   4. Right atrial size was severely dilated.   5. The mitral valve is abnormal. Mild mitral valve regurgitation. No  evidence of mitral stenosis. Moderate mitral annular calcification.   6. The tricuspid valve is abnormal. Tricuspid valve regurgitation is  moderate to severe.   7. There is a 25 mm St. Jude mechanical valve present in the aortic  position.     . The aortic valve has been repaired/replaced. Aortic valve  regurgitation is not visualized. No aortic stenosis is present. There is a  25 mm St. Jude mechanical valve present in the aortic position. Procedure  Date: 2007. Aortic valve mean gradient  measures 5.0 mmHg.   8. The inferior vena cava is dilated in size with >50% respiratory  variability, suggesting right atrial pressure of 8 mmHg.   Comparison(s): A prior study was performed on 09/10/2023. EF 65-70%.  Interventricular septum is flattened in systole and diastole, consistent  with right ventricular pressure and volume overload.  Right ventricular  size is mildly enlarged and systolic  function mildly reduced. Large rounded echodensity within region of the  left atrium. Similar finding on previous echo. Severe bi-atrial  enlargement. Moderate mitral valve regurgitation. 25 mm mechancial St.  Jude aortic valve noted-men pg 5.0 mmHg.    Laboratory Data: High Sensitivity Troponin:  No results for input(s): TROPONINIHS in the last 720 hours.   Chemistry Recent Labs  Lab 09/08/24 1909 09/09/24 0448  NA 142 142  K 2.9* 4.3  CL 100 103  CO2 32 32  GLUCOSE 116* 95  BUN 24* 23  CREATININE 1.66* 1.80*  CALCIUM  8.9 8.5*  GFRNONAA 32* 29*  ANIONGAP 10 8    Recent Labs  Lab 09/08/24 1909  PROT 6.8  ALBUMIN 4.1  AST 29  ALT 13  ALKPHOS 91  BILITOT 0.7   Lipids No results for input(s): CHOL, TRIG, HDL, LABVLDL, LDLCALC, CHOLHDL in the last 168 hours.  Hematology Recent Labs  Lab  09/08/24 1909 09/09/24 0448  WBC 5.9 5.1  RBC 3.25* 3.13*  HGB 8.9* 8.7*  HCT 29.3* 28.5*  MCV 90.2 91.1  MCH 27.4 27.8  MCHC 30.4 30.5  RDW 14.6 14.8  PLT 211 181   Thyroid  No results for input(s): TSH, FREET4 in the last 168 hours.  BNP Recent Labs  Lab 09/08/24 1909  PROBNP 5,004.0*    DDimer No results for input(s): DDIMER in the last 168 hours.  Radiology/Studies:  DG Chest Portable 1 View Result Date: 09/08/2024 CLINICAL DATA:  Shortness of breath. EXAM: PORTABLE CHEST 1 VIEW COMPARISON:  05/05/2024 FINDINGS: Stable cardiac enlargement and prominent mediastinal and hilar contours. Tortuosity and calcification of the aorta again noted. Peribronchial thickening and increased interstitial markings likely pulmonary edema superimposed on chronic bronchitic changes. There is a chronic right pleural effusion and overlying atelectasis and or scarring. No pneumothorax. IMPRESSION: Cardiac enlargement, pulmonary edema and chronic right pleural effusion. Electronically Signed   By: MYRTIS Stammer M.D.    On: 09/08/2024 19:01     Assessment and Plan:  1. Acute onchronic HFpEF - Presented with worsening dyspnea and abdominal distention over the past 3 to 4 days. proBNP elevated at 5004. CXR showed pulmonary edema and a chronic right pleural effusion. - She has been started on IV Lasix  40 mg twice daily but has only received 1 dose thus far, therefore difficult to determine her response. Will continue to follow I's and O's along with daily weights. Continue current dosing for now.  - She is unsure of her baseline weight as this previously declined to 136 lbs while at rehab but she reports an improvement in her appetite since. Will need to find a new dry weight. It is also challenging in that she does not know what dose of Torsemide  she is currently taking. I did reach out to her pharmacy and they report that she filled a prescription for Torsemide  100 mg daily but this was filled in 06/2024 for a 30-day supply and has not been refilled since. By review of notes, she was previously on Torsemide  60 mg twice daily and possibly on 50 mg twice daily at one point. Will address closer to discharge.   2. Aortic Stenosis - She has a St. Jude mechanical AVR which was placed in 2007 with repair of her ascending aorta at that time. Echocardiogram in 04/2024 showed normal valve function with mean gradient 5 mmHg. - INR was at 5.6 on admission but she had taken Coumadin  that morning and INR is at 6.0 today. Coumadin  has been held. Appreciate pharmacy's assistance  3.  CAD - She previously underwent CABG in 2007 at the time of AVR. She denies any recent anginal symptoms. - Continue Crestor  20 mg daily and she is not on ASA given the need for anticoagulation.  4. Permanent atrial fibrillation - Heart rate has been well-controlled in the 70's by review of telemetry. She has been continued on Cardizem  CD 120 mg daily for rate-control. - She is on Coumadin  for anticoagulation but this is currently being held as  discussed above given her elevated INR.  5. Stage III CKD - Baseline creatinine 1.4 - 1.5 and peaked at 2.38 during admission 04/2024. Was at 1.26 when checked in 05/2024 but this appears to be an outlier. Creatinine at 1.66 on admission and 1.80 today. Follow with diuresis.  6. Possible LA Mass - This was noted on prior echocardiogram in 08/2023 and again noted on repeat imaging in 04/2024.  TEE  was not pursued given her respiratory status and outpatient Coronary CTA was not arranged by review of notes as she did not wish to undergo additional testing.  7. COPD/Chronic Hypoxic Respiratory Failure - She is on 4 L nasal cannula at baseline.   Risk Assessment/Risk Scores: New York  Heart Association (NYHA) Functional Class NYHA Class III  CHA2DS2-VASc Score = 5   This indicates a 7.2% annual risk of stroke. The patient's score is based upon: CHF History: 1 HTN History: 1 Diabetes History: 0 Stroke History: 0 Vascular Disease History: 1 Age Score: 1 Gender Score: 1  For questions or updates, please contact [facility] Please consult [email or web address] for contact info under    Signed, Laymon CHRISTELLA Qua, PA-C  09/09/2024 10:16 AM  Attending Note Patient seen and discussed with PA Qua, I agree with her documentation. 73 yo female  hx of aortic stenosis (s/p St. Jude mechanical AVR in 2007 with repair of ascending aortic aneurysm), CAD with prior CABG, permanent atrial fibrillation, chronic HFpEF, HTN, HLD, Stage 3 CKD, , COPD and chronic hypoxic respiratory failure on 4L [place] at home admitted with SOB.   K 2.9 BUN 24 Cr 1.66 proBNP 5004 WBC 5.9 Hgb 8.9 Plt 211  EKG rate controlled afib CXR +pulm edema  04/2024 echo: LVE 60-65%, no WMAs, indet diastolic function, Dshaped septum, normal AVR  1.Acute on chronic HFpEF - 04/2024 echo: LVE 60-65%, no WMAs, indet diastolic function, Dshaped septum, normal AVR - CXR +pulm edema, proBNP 5004 - continue IV diuresis today,  remains fluid overloaded   2. Mechanical AVR - coumadin  per pharmacy, was supratherapeutic on admission - normal AVR function by 04/2024 echo   Dorn Ross MD

## 2024-09-09 NOTE — Assessment & Plan Note (Signed)
 Continue with PPI

## 2024-09-09 NOTE — Progress Notes (Signed)
   09/09/24 0248  Vitals  BP 110/77  MAP (mmHg) 89  BP Location Right Arm  BP Method Automatic  Patient Position (if appropriate) Lying (head elevated)  Pulse Rate 77  Pulse Rate Source Dinamap  ECG Heart Rate 74  Resp (!) 33  Level of Consciousness  Level of Consciousness Alert  MEWS COLOR  MEWS Score Color Yellow  Oxygen  Therapy  SpO2 92 %  O2 Device Nasal Cannula  O2 Flow Rate (L/min) 6 L/min  Pain Assessment  Pain Scale 0-10  Pain Score 0  MEWS Score  MEWS Temp 0  MEWS Systolic 0  MEWS Pulse 0  MEWS RR 2  MEWS LOC 0  MEWS Score 2    Overnight floor coverage, NP Jesus was informed of vital signs. Pt BNP is 5000, she is increasingly tachypneic despite albuterol  neb,6 L Opp, and becoming less tolerant of movement.  Per MAR, no diuretic administered. This RN requested that 0800 IV lasix  dose be moved to now.  NP Jesus agrees. Lasix  adminstered. Kellogg RN

## 2024-09-09 NOTE — Assessment & Plan Note (Signed)
 INR supratherapeutic at 6.0 with goal of 2.5-3.5 due to mechanical aortic valve. - Coumadin  per pharmacy

## 2024-09-09 NOTE — Assessment & Plan Note (Signed)
 Patient with history of CKD stage IIIb, slight increase in creatinine but remained within her baseline. - Monitor renal function -Avoid nephrotoxins

## 2024-09-09 NOTE — Assessment & Plan Note (Signed)
 INR at 6.0.  No concern of bleeding at this time. - Appreciate pharmacy help with monitoring -Keep holding Coumadin 

## 2024-09-09 NOTE — Assessment & Plan Note (Signed)
 S/p CABG in 2007.  No chest pain. - Continue with home statin -Not on any antiplatelet due to the need for anticoagulation with Coumadin 

## 2024-09-09 NOTE — Telephone Encounter (Signed)
 Pharmacy Patient Advocate Encounter  Insurance verification completed.    The patient is insured through [facility]. Patient has Medicare and is not eligible for a copay card, but may be able to apply for patient assistance or Medicare RX Payment Plan (Patient Must reach out to their plan, if eligible for payment plan), if available.    Ran test claim for Breztri  [phone] and it requires a [employer]ains [employer] prefers Trelegy   This test claim was processed through [employer]- copay amounts may vary at other pharmacies due to [employer], or as the patient moves through the different stages of their insurance plan.

## 2024-09-10 DIAGNOSIS — I5033 Acute on chronic diastolic (congestive) heart failure: Secondary | ICD-10-CM | POA: Diagnosis not present

## 2024-09-10 LAB — BASIC METABOLIC PANEL WITH GFR
Anion gap: 7 (ref 5–15)
BUN: 21 mg/dL (ref 8–23)
CO2: 32 mmol/L (ref 22–32)
Calcium: 8.5 mg/dL — ABNORMAL LOW (ref 8.9–10.3)
Chloride: 101 mmol/L (ref 98–111)
Creatinine, Ser: 1.51 mg/dL — ABNORMAL HIGH (ref 0.44–1.00)
GFR, Estimated: 36 mL/min — ABNORMAL LOW (ref >60)
Glucose, Bld: 84 mg/dL (ref 70–99)
Potassium: 4.2 mmol/L (ref 3.5–5.1)
Sodium: 140 mmol/L (ref 135–145)

## 2024-09-10 LAB — CBC
HCT: 26.9 % — ABNORMAL LOW (ref 36.0–46.0)
Hemoglobin: 8.2 g/dL — ABNORMAL LOW (ref 12.0–15.0)
MCH: 27.7 pg (ref 26.0–34.0)
MCHC: 30.5 g/dL (ref 30.0–36.0)
MCV: 90.9 fL (ref 80.0–100.0)
Platelets: 156 K/uL (ref 150–400)
RBC: 2.96 MIL/uL — ABNORMAL LOW (ref 3.87–5.11)
RDW: 14.6 % (ref 11.5–15.5)
WBC: 3.7 K/uL — ABNORMAL LOW (ref 4.0–10.5)
nRBC: 0 % (ref 0.0–0.2)

## 2024-09-10 LAB — PROTIME-INR
INR: 5.2 (ref 0.8–1.2)
INR: 4.9 (ref 0.8–1.2)
Prothrombin Time: 50.1 s — ABNORMAL HIGH (ref 11.4–15.2)
Prothrombin Time: 47.6 s — ABNORMAL HIGH (ref 11.4–15.2)

## 2024-09-10 NOTE — Progress Notes (Signed)
 Pt refused Red Clips this am upon this writer giving her medications and explaining to her that this writer would be back to obtain Red clips reading. Pt stated my back hurts and I want to try to go back to sleep. I don't feel like doing that. Will report to oncoming shift.

## 2024-09-10 NOTE — Plan of Care (Signed)
   Problem: Education: Goal: Knowledge of General Education information will improve Description Including pain rating scale, medication(s)/side effects and non-pharmacologic comfort measures Outcome: Progressing   Problem: Health Behavior/Discharge Planning: Goal: Ability to manage health-related needs will improve Outcome: Progressing

## 2024-09-10 NOTE — Consult Note (Signed)
 PHARMACY - ANTICOAGULATION CONSULT NOTE  Pharmacy Consult for Warfarin Indication: mAVR & Afib  Allergies  Allergen Reactions   Penicillin G Itching   Penicillins Hives, Itching, Swelling and Rash   Dust Mite Extract Other (See Comments)    Seasonal allergies    Patient Measurements: Weight: 70.6 kg (155 lb 10.3 oz)  Vital Signs: Temp: 97.5 F (36.4 C) (11/22 0430) Temp Source: Oral (11/22 0430) BP: 106/63 (11/22 0430) Pulse Rate: 86 (11/22 0430)  Labs: Recent Labs    09/08/24 1909 09/09/24 0448 09/10/24 0426 09/10/24 0838  HGB 8.9* 8.7* 8.2*  --   HCT 29.3* 28.5* 26.9*  --   PLT 211 181 156  --   LABPROT 52.8* 56.1* 50.1* 47.6*  INR 5.6* 6.0* 5.2* 4.9*  CREATININE 1.66* 1.80* 1.51*  --     Estimated Creatinine Clearance: 34 mL/min (A) (by C-G formula based on SCr of 1.51 mg/dL (H)).   Medical History: Past Medical History:  Diagnosis Date   Anxiety disorder    Aortic stenosis    Status post St. Jude mechanical AVR 2007   Asthma    Atrial fibrillation (HCC)    Carcinoid tumor of colon (HCC) 2007   Chronic diastolic heart failure (HCC)    Coronary atherosclerosis of native coronary artery    Status post CABG 2007   GERD (gastroesophageal reflux disease)    History of colonoscopy 2003   Dr. 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 Shaaron, benign biopsies   RLS (restless legs syndrome)    Schatzki's ring     Medications:  Warfarin 3 mg daily except for 4 mg on Tuesday (TWD: 22 mg)  Last patient reported dose was 11/20 at noon  Assessment: 73 y/o F with medical history as above presenting with shortness of breath and CXR impression of pulmonary edema. Pharmacy consulted to dose warfarin while patient is admitted.  INR today is 5.6> 6> 5.2.  Supratherapeutic, no bleeding noted Hgb 8.2  Goal of Therapy:  INR 2.5 to 3.5 with mAVR and Afib Monitor platelets by  anticoagulation protocol: Yes   Plan:  No coumadin  today PT INR daily Monitor for S/S of bleeding  Elspeth Sour, PharmD Clinical Pharmacist 09/10/2024 9:41 AM

## 2024-09-10 NOTE — Progress Notes (Signed)
 Progress Note   Patient: [name] FMW:[id number] DOB: [birth year] DOA: 09/08/2024     2 DOS: the patient was seen and examined on 09/10/2024   Brief hospital course: Partly taken from H&P.  [name]  is a 73 y.o. female,  with longstanding history of COPD and chronic respiratory failure typically on 4 L/min supplemental oxygen , chronic atrial fibrillation, HTN, HLD, CAD status post 2V CABG with mechanical aortic valve replacement, presented to ED with complaints of worsening shortness of breath over the last 3 days, also reports orthopnea.  Per patient she was compliant with her home diuretics and salt restrictions.  On presentation she was hypoxic at 85% and requiring up to 6 L of oxygen .  Chest x-ray with cardiomegaly, chronic right pleural effusion and pulmonary edema.  Significantly elevated proBNP at 5004, INR elevated at 5.6, potassium at 2.9.  Patient was admitted for acute on chronic hypoxic respiratory failure secondary to acute on chronic HFpEF.  Started on IV diuresis.  Cardiology was consulted.  11/21: Vital stable on 6 L of oxygen , slight worsening of creatinine to 1.80, worsening of INR to 6.0, hemoglobin 8.7.  Lasix  is being started this morning as potassium was being repleted on admission.  Patient also has a possible LA mass which was noted on prior echoes.  Patient did not pursue TEE or further investigation.  Assessment and Plan:  Partly taken from H&P.   [name]  is a 73 y.o. female,  with longstanding history of COPD and chronic respiratory failure typically on 4 L/min supplemental oxygen , chronic atrial fibrillation, HTN, HLD, CAD status post 2V CABG with mechanical aortic valve replacement, presented to ED with complaints of worsening shortness of breath over the last 3 days, also reports orthopnea.  Per patient she was compliant with her home diuretics and salt restrictions.   On presentation she was hypoxic at 85% and requiring up to 6 L of oxygen .   Chest x-ray with cardiomegaly, chronic right pleural effusion and pulmonary edema.  Significantly elevated proBNP at 5004, INR elevated at 5.6, potassium at 2.9.   Patient was admitted for acute on chronic hypoxic respiratory failure secondary to acute on chronic HFpEF.  Started on IV diuresis.  Cardiology was consulted.   11/21: Vital stable on 6 L of oxygen , slight worsening of creatinine to 1.80, worsening of INR to 6.0, hemoglobin 8.7.  Lasix  is being started this morning as potassium was being repleted on admission.   Patient also has a possible LA mass which was noted on prior echoes.  Patient did not pursue TEE or further investigation.   Assessment and Plan: * Acute on chronic diastolic CHF (congestive heart failure) (HCC) 2D echo 04/28/2024 significant for preserved EF at 60% with indeterminate diastolic parameters, but significant hypertrophy and anterior ventricular septum flattening. proBNP significantly elevated at 5004, chest x-ray with pulmonary vascular congestion and pleural effusion. - Will continue with IV Lasix  twice daily with monitoring of electrolytes and kidney function -Intake and output   Acute on chronic respiratory failure with hypoxia (HCC) Patient was using up to 6 L of oxygen  on admission, history of 4 L at baseline. - Currently 5 L/min.  Hypokalemia will monitor and replace as needed   CKD (chronic kidney disease) stage 3, GFR 30-59 ml/min (HCC) Patient with history of CKD stage IIIb, slight increase in creatinine but remained within her baseline. - Monitor renal function -Avoid nephrotoxins   Permanent atrial fibrillation (HCC) Heart rate currently controlled. - Continue with home  Cardizem  -Coumadin  is currently being held due to supratherapeutic INR   S/P AVR (aortic valve replacement) INR supratherapeutic at 6.0 with goal of 2.5-3.5 due to mechanical aortic valve. - Coumadin  per pharmacy, will check daily INR.   Supratherapeutic INR INR at 6.0.   No concern of bleeding at this time. - Appreciate pharmacy help with monitoring -Keep holding Coumadin    Coronary artery disease due to lipid rich plaque S/p CABG in 2007.  No chest pain. - Continue with home statin -Not on any antiplatelet due to the need for anticoagulation with Coumadin    Hypothyroidism - Continue home Synthroid    GERD - Continue with PPI   Anxiety disorder - Continue home meds   RLS (restless legs syndrome) - Continue Requip   Subjective: Patient was seen and examined today.  Denies any shortness of breath, feels somewhat better than yesterday.  Vital signs are stable.  Physical Exam: Vitals:   09/09/24 1933 09/10/24 0430 09/10/24 1003 09/10/24 1008  BP: 120/64 106/63 127/78 127/78  Pulse: 74 86  87  Resp: (!) 24 18    Temp: 98.7 F (37.1 C) (!) 97.5 F (36.4 C)    TempSrc: Oral Oral    SpO2: 96% 95%  90%  Weight:  70.6 kg     General.  Frail elderly lady, in no acute distress. Pulmonary.  Few basal crackles bilaterally, normal respiratory effort. CV.  Irregularly irregular  Abdomen.  Soft, nontender, nondistended, BS positive. CNS.  Alert and oriented .  No focal neurologic deficit. Extremities.  No edema, pulses intact and symmetrical.   Data Reviewed: Prior data reviewed  Family Communication: None at the bedside  Disposition: Status is: Inpatient Remains inpatient appropriate because: Severity of illness  Planned Discharge Destination: Home  DVT prophylaxis.  Coumadin -currently being held due to supratherapeutic INR  This record has been created using Conservation officer, historic buildings. Errors have been sought and corrected,but may not always be located. Such creation errors do not reflect on the standard of care.   Author: Derryl Duval, MD 09/10/2024 11:46 AM  For on call review [email or web address].

## 2024-09-10 NOTE — Progress Notes (Signed)
   09/10/24 9082  Provider Notification  Provider Name/Title Santosh Sigdel  Date Provider Notified 09/10/24  Time Provider Notified [phone]  Method of Notification  (secure chat)  Notification Reason Critical Result  Test performed and critical result INR 5.2  Date Critical Result Received 09/10/24  Time Critical Result Received 0845

## 2024-09-10 NOTE — Plan of Care (Signed)

## 2024-09-11 DIAGNOSIS — J9621 Acute and chronic respiratory failure with hypoxia: Secondary | ICD-10-CM

## 2024-09-11 DIAGNOSIS — Z952 Presence of prosthetic heart valve: Secondary | ICD-10-CM | POA: Diagnosis not present

## 2024-09-11 DIAGNOSIS — I5033 Acute on chronic diastolic (congestive) heart failure: Secondary | ICD-10-CM | POA: Diagnosis not present

## 2024-09-11 LAB — CBC WITH DIFFERENTIAL/PLATELET
Abs Immature Granulocytes: 0.02 K/uL (ref 0.00–0.07)
Basophils Absolute: 0 K/uL (ref 0.0–0.1)
Basophils Relative: 0 %
Eosinophils Absolute: 0.1 K/uL (ref 0.0–0.5)
Eosinophils Relative: 2 %
HCT: 27.9 % — ABNORMAL LOW (ref 36.0–46.0)
Hemoglobin: 8.4 g/dL — ABNORMAL LOW (ref 12.0–15.0)
Immature Granulocytes: 1 %
Lymphocytes Relative: 16 %
Lymphs Abs: 0.7 K/uL (ref 0.7–4.0)
MCH: 27.6 pg (ref 26.0–34.0)
MCHC: 30.1 g/dL (ref 30.0–36.0)
MCV: 91.8 fL (ref 80.0–100.0)
Monocytes Absolute: 0.5 K/uL (ref 0.1–1.0)
Monocytes Relative: 12 %
Neutro Abs: 2.8 K/uL (ref 1.7–7.7)
Neutrophils Relative %: 69 %
Platelets: 163 K/uL (ref 150–400)
RBC: 3.04 MIL/uL — ABNORMAL LOW (ref 3.87–5.11)
RDW: 14.5 % (ref 11.5–15.5)
WBC: 4 K/uL (ref 4.0–10.5)
nRBC: 0 % (ref 0.0–0.2)

## 2024-09-11 LAB — BASIC METABOLIC PANEL WITH GFR
Anion gap: 7 (ref 5–15)
BUN: 21 mg/dL (ref 8–23)
CO2: 34 mmol/L — ABNORMAL HIGH (ref 22–32)
Calcium: 8.9 mg/dL (ref 8.9–10.3)
Chloride: 99 mmol/L (ref 98–111)
Creatinine, Ser: 1.61 mg/dL — ABNORMAL HIGH (ref 0.44–1.00)
GFR, Estimated: 34 mL/min — ABNORMAL LOW (ref >60)
Glucose, Bld: 82 mg/dL (ref 70–99)
Potassium: 4.5 mmol/L (ref 3.5–5.1)
Sodium: 139 mmol/L (ref 135–145)

## 2024-09-11 LAB — PROTIME-INR
INR: 4.1 (ref 0.8–1.2)
Prothrombin Time: 41.3 s — ABNORMAL HIGH (ref 11.4–15.2)

## 2024-09-11 NOTE — Progress Notes (Addendum)
 Progress Note   Patient: [name] FMW:[id number] DOB: 09-Sep-1951 DOA: 09/08/2024     3 DOS: the patient was seen and examined on 09/11/2024   Brief hospital course: Partly taken from H&P.  [name]  is a 73 y.o. female,  with longstanding history of COPD and chronic respiratory failure typically on 4 L/min supplemental oxygen , chronic atrial fibrillation, HTN, HLD, CAD status post 2V CABG with mechanical aortic valve replacement, presented to ED with complaints of worsening shortness of breath over the last 3 days, also reports orthopnea.  Per patient she was compliant with her home diuretics and salt restrictions.  On presentation she was hypoxic at 85% and requiring up to 6 L of oxygen .  Chest x-ray with cardiomegaly, chronic right pleural effusion and pulmonary edema.  Significantly elevated proBNP at 5004, INR elevated at 5.6, potassium at 2.9.  Patient was admitted for acute on chronic hypoxic respiratory failure secondary to acute on chronic HFpEF.  Started on IV diuresis.  Cardiology was consulted.  11/21: Vital stable on 6 L of oxygen , slight worsening of creatinine to 1.80, worsening of INR to 6.0, hemoglobin 8.7.  Lasix  is being started this morning as potassium was being repleted on admission.  Patient also has a possible LA mass which was noted on prior echoes.  Patient did not pursue TEE or further investigation.   Assessment and Plan: * Acute on chronic diastolic CHF (congestive heart failure) (HCC) 2D echo 04/28/2024 significant for preserved EF at 60% with indeterminate diastolic parameters, but significant hypertrophy and anterior ventricular septum flattening. proBNP significantly elevated at 5004, chest x-ray with pulmonary vascular congestion and pleural effusion. - Will continue with IV Lasix  twice daily with monitoring of electrolytes and kidney function -monitor Intake and output   Acute on chronic respiratory failure with hypoxia (HCC) Patient was using up  to 6 L of oxygen  on admission, history of 4 L at baseline. - Currently 5 L/min.  Hypokalemia will monitor and replace as needed   CKD (chronic kidney disease) stage 3, GFR 30-59 ml/min (HCC) Patient with history of CKD stage IIIb, slight increase in creatinine but remained within her baseline. - Monitor renal function -Avoid nephrotoxins   Permanent atrial fibrillation (HCC) Heart rate currently controlled. - Continue with home Cardizem  -Coumadin  is currently being held due to supratherapeutic INR   S/P AVR (aortic valve replacement) INR supratherapeutic at 6.0 with goal of 2.5-3.5 due to mechanical aortic valve. - Coumadin  per pharmacy, will check daily INR.   Supratherapeutic INR INR at 6.0>>4.1No concern of bleeding at this time. - Appreciate pharmacy help with monitoring -Keep holding Coumadin    Coronary artery disease due to lipid rich plaque S/p CABG in 2007.  No chest pain. - Continue with home statin -Not on any antiplatelet due to the need for anticoagulation with Coumadin    Hypothyroidism - Continue home Synthroid    GERD - Continue with PPI   Anxiety disorder - Continue home meds   RLS (restless legs syndrome) - Continue Requip   Subjective: Patient was seen and examined today.  Denies chest pain.  Hypoxic with 5 L/min oxygen .  Says she sat on a chair for about 2 hours yesterday.  Feels weak.  Physical Exam: Vitals:   09/11/24 0418 09/11/24 0607 09/11/24 0901 09/11/24 1315  BP: 108/64  107/66 113/76  Pulse: 89  97 92  Resp: 14   18  Temp: 98.1 F (36.7 C)   98.4 F (36.9 C)  TempSrc: Oral   Oral  SpO2: 94%   96%  Weight: 73.1 kg 69.2 kg     General.  Frail elderly lady, in no acute distress. Pulmonary.  Few basal crackles bilaterally, normal respiratory effort. CV.  Irregularly irregular, click Abdomen.  Soft, nontender, nondistended, BS positive. CNS.  Alert and oriented .  No focal neurologic deficit. Extremities.  No edema, pulses intact and  symmetrical.   Data Reviewed: Prior data reviewed  Family Communication: None at the bedside  Disposition: Status is: Inpatient Remains inpatient appropriate because: Severity of illness  Planned Discharge Destination: Home, will have PT and OT assessment.  Patient has declined SNF in the past  DVT prophylaxis.  Coumadin -currently being held due to supratherapeutic INR  This record has been created using Conservation officer, historic buildings. Errors have been sought and corrected,but may not always be located. Such creation errors do not reflect on the standard of care.   Author: Anusha Claus, MD 09/11/2024 2:41 PM  For on call review [email or web address].

## 2024-09-11 NOTE — Consult Note (Signed)
 PHARMACY - ANTICOAGULATION CONSULT NOTE  Pharmacy Consult for Warfarin Indication: mAVR & Afib  Allergies  Allergen Reactions   Penicillin G Itching   Penicillins Hives, Itching, Swelling and Rash   Dust Mite Extract Other (See Comments)    Seasonal allergies    Patient Measurements: Weight: 69.2 kg (152 lb 8.9 oz)  Vital Signs: Temp: 98.1 F (36.7 C) (11/23 0418) Temp Source: Oral (11/23 0418) BP: 107/66 (11/23 0901) Pulse Rate: 97 (11/23 0901)  Labs: Recent Labs    09/09/24 0448 09/10/24 0426 09/10/24 0838 09/11/24 0425  HGB 8.7* 8.2*  --  8.4*  HCT 28.5* 26.9*  --  27.9*  PLT 181 156  --  163  LABPROT 56.1* 50.1* 47.6* 41.3*  INR 6.0* 5.2* 4.9* 4.1*  CREATININE 1.80* 1.51*  --  1.61*    Estimated Creatinine Clearance: 31.9 mL/min (A) (by C-G formula based on SCr of 1.61 mg/dL (H)).   Medical History: Past Medical History:  Diagnosis Date   Anxiety disorder    Aortic stenosis    Status post St. Jude mechanical AVR 2007   Asthma    Atrial fibrillation (HCC)    Carcinoid tumor of colon (HCC) 2007   Chronic diastolic heart failure (HCC)    Coronary atherosclerosis of native coronary artery    Status post CABG 2007   GERD (gastroesophageal reflux disease)    History of colonoscopy 2003   Dr. 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 Shaaron, benign biopsies   RLS (restless legs syndrome)    Schatzki's ring     Medications:  Warfarin 3 mg daily except for 4 mg on Tuesday (TWD: 22 mg)  Last patient reported dose was 11/20 at noon  Assessment: 73 y/o F with medical history as above presenting with shortness of breath and CXR impression of pulmonary edema. Pharmacy consulted to dose warfarin while patient is admitted.  INR today is 5.6> 6> 5.2> 4.1.  Supratherapeutic, no bleeding noted Hgb 8.2> 8.4, stable  Goal of Therapy:  INR 2.5 to 3.5 with mAVR and Afib Monitor  platelets by anticoagulation protocol: Yes   Plan:  No coumadin  today PT INR daily Monitor for S/S of bleeding  Cherlyn Boers, BS Pharm D, BCPS Clinical Pharmacist 09/11/2024 9:52 AM

## 2024-09-11 NOTE — Plan of Care (Signed)

## 2024-09-12 ENCOUNTER — Inpatient Hospital Stay (HOSPITAL_COMMUNITY)

## 2024-09-12 DIAGNOSIS — I5033 Acute on chronic diastolic (congestive) heart failure: Secondary | ICD-10-CM

## 2024-09-12 DIAGNOSIS — Z951 Presence of aortocoronary bypass graft: Secondary | ICD-10-CM

## 2024-09-12 DIAGNOSIS — N1832 Chronic kidney disease, stage 3b: Secondary | ICD-10-CM

## 2024-09-12 DIAGNOSIS — I4821 Permanent atrial fibrillation: Secondary | ICD-10-CM

## 2024-09-12 DIAGNOSIS — J9611 Chronic respiratory failure with hypoxia: Secondary | ICD-10-CM

## 2024-09-12 DIAGNOSIS — E876 Hypokalemia: Secondary | ICD-10-CM | POA: Diagnosis not present

## 2024-09-12 DIAGNOSIS — Z952 Presence of prosthetic heart valve: Secondary | ICD-10-CM | POA: Diagnosis not present

## 2024-09-12 DIAGNOSIS — J9621 Acute and chronic respiratory failure with hypoxia: Secondary | ICD-10-CM | POA: Diagnosis not present

## 2024-09-12 DIAGNOSIS — Z7901 Long term (current) use of anticoagulants: Secondary | ICD-10-CM

## 2024-09-12 DIAGNOSIS — I25810 Atherosclerosis of coronary artery bypass graft(s) without angina pectoris: Secondary | ICD-10-CM

## 2024-09-12 LAB — BASIC METABOLIC PANEL WITH GFR
Anion gap: 7 (ref 5–15)
BUN: 22 mg/dL (ref 8–23)
CO2: 35 mmol/L — ABNORMAL HIGH (ref 22–32)
Calcium: 8.9 mg/dL (ref 8.9–10.3)
Chloride: 97 mmol/L — ABNORMAL LOW (ref 98–111)
Creatinine, Ser: 1.64 mg/dL — ABNORMAL HIGH (ref 0.44–1.00)
GFR, Estimated: 33 mL/min — ABNORMAL LOW (ref >60)
Glucose, Bld: 114 mg/dL — ABNORMAL HIGH (ref 70–99)
Potassium: 4.1 mmol/L (ref 3.5–5.1)
Sodium: 138 mmol/L (ref 135–145)

## 2024-09-12 LAB — PROTIME-INR
INR: 3.2 — ABNORMAL HIGH (ref 0.8–1.2)
Prothrombin Time: 34.5 s — ABNORMAL HIGH (ref 11.4–15.2)

## 2024-09-12 MED ORDER — WARFARIN SODIUM 2.5 MG PO TABS
2.5000 mg | ORAL_TABLET | Freq: Once | ORAL | Status: AC
  Administered 2024-09-12: 2.5 mg via ORAL
  Filled 2024-09-12: qty 1

## 2024-09-12 MED ORDER — FUROSEMIDE 10 MG/ML IJ SOLN
40.0000 mg | Freq: Once | INTRAMUSCULAR | Status: AC
  Administered 2024-09-12: 40 mg via INTRAVENOUS
  Filled 2024-09-12: qty 4

## 2024-09-12 MED ORDER — FUROSEMIDE 10 MG/ML IJ SOLN
80.0000 mg | Freq: Two times a day (BID) | INTRAMUSCULAR | Status: DC
  Administered 2024-09-12 – 2024-09-14 (×5): 80 mg via INTRAVENOUS
  Filled 2024-09-12 (×6): qty 8

## 2024-09-12 MED ORDER — ACETAZOLAMIDE SODIUM 500 MG IJ SOLR
500.0000 mg | Freq: Every day | INTRAMUSCULAR | Status: DC
  Administered 2024-09-12: 500 mg via INTRAVENOUS
  Filled 2024-09-12 (×4): qty 500

## 2024-09-12 MED ORDER — WARFARIN - PHARMACIST DOSING INPATIENT
Freq: Every day | Status: DC
  Administered 2024-09-14: 1

## 2024-09-12 NOTE — Progress Notes (Signed)
 PROGRESS NOTE   [name]  FMW:[id number] DOB: August 09, 1951 DOA: 09/08/2024 PCP: Renato Dorothey HERO, NP   Chief Complaint  Patient presents with   Shortness of Breath   Level of care: Telemetry  Brief Admission History:  73 y.o. female with longstanding history of COPD and chronic respiratory failure typically on 4 L/min supplemental oxygen , chronic atrial fibrillation, HTN, HLD, CAD status post 2V CABG with mechanical aortic valve replacement, presented to ED with complaints of worsening shortness of breath over the last 3 days, also reports orthopnea.  Per patient she was compliant with her home diuretics and salt restrictions.  On presentation she was hypoxic at 85% and requiring up to 6 L of oxygen .  Chest x-ray with cardiomegaly, chronic right pleural effusion and pulmonary edema.  Significantly elevated proBNP at 5004, INR elevated at 5.6, potassium at 2.9.  Patient was admitted for acute on chronic hypoxic respiratory failure secondary to acute on chronic HFpEF.  Started on IV diuresis.  Cardiology was consulted.  Patient also has a possible LA mass which was noted on prior echoes.  Patient did not pursue TEE or further investigation.   Assessment and Plan:  Acute HFpEF 2D echo 04/28/2024 significant for preserved EF at 60% with indeterminate diastolic parameters, but significant hypertrophy and anterior ventricular septum flattening. proBNP significantly elevated at 5004, chest x-ray with pulmonary vascular congestion and pleural effusion. Cardiology was consulted and patient was started on IV Lasix  but never received until earlier today, as potassium was being repleted. -Continue with IV Lasix  40 mg twice daily -Daily weight and BMP -Strict intake and output  RLS (restless legs syndrome) - Continue Requip   Anxiety disorder - Continue home meds  Coronary artery disease due to lipid rich plaque S/p CABG in 2007.  No chest pain. - Continue with home statin - Not on any  antiplatelet due to the need for anticoagulation with Coumadin   Permanent atrial fibrillation  Heart rate currently controlled. - Continue with home Cardizem  -Coumadin  is currently being held due to supratherapeutic INR  Acute on chronic respiratory failure with hypoxia  Patient was using up to 6 L of oxygen  on admission, history of 4 L at baseline. - Continue with supplemental oxygen -wean to baseline as tolerated  Supratherapeutic INR INR at 6.0.  No concern of bleeding at this time. - Appreciate pharmacy help with monitoring - per pharm D now that INR is 3.2, resume warfarin 2.5 mg today - daily PT/INR testing   Hypothyroidism - Continue home Synthroid   CKD (chronic kidney disease) stage 3, GFR 30-59 ml/min \ - Patient with history of CKD stage IIIb, slight increase in creatinine but remained within her baseline. - Monitor renal function - Avoid nephrotoxins  Hypokalemia - repleted  - Continue to monitor and replete as needed while she is being diuresed  S/P AVR (aortic valve replacement) INR supratherapeutic at 6.0 with goal of 2.5-3.5 due to mechanical aortic valve. - warfarin dosing per pharm D  GERD - Continue with PPI  DVT prophylaxis: warfarin Code Status: DNR  Family Communication:  Disposition: Home with HH (declining SNF)  Consultants:   Procedures:  Antimicrobials:    Subjective: Pt reports she is urinating frequently, she is weak but does not want SNF, will accept [facility]  Objective: Vitals:   09/12/24 0406 09/12/24 0439 09/12/24 0717 09/12/24 0816  BP: 100/60   114/68  Pulse: 82   91  Resp: 18     Temp: 97.8 F (36.6 C)   97.6 F (  36.4 C)  TempSrc: Oral   Oral  SpO2: 91%  93% 91%  Weight:  69.6 kg      Intake/Output Summary (Last 24 hours) at 09/12/2024 1117 Last data filed at 09/12/2024 0500 Gross per 24 hour  Intake 600 ml  Output 800 ml  Net -200 ml   Filed Weights   09/11/24 0418 09/11/24 0607 09/12/24 0439  Weight: 73.1 kg 69.2 kg  69.6 kg   Examination:  General exam: Appears calm and comfortable  Respiratory system: Clear to auscultation. Respiratory effort normal. Cardiovascular system: normal S1 & S2 heard. No JVD, murmurs, rubs, gallops or clicks. No pedal edema. Gastrointestinal system: Abdomen is nondistended, soft and nontender. No organomegaly or masses felt. Normal bowel sounds heard. Central nervous system: Alert and oriented. No focal neurological deficits. Extremities: Symmetric 5 x 5 power. Skin: No rashes, lesions or ulcers. Psychiatry: Judgement and insight appear normal. Mood & affect appropriate.   Data Reviewed: I have personally reviewed following labs and imaging studies  CBC: Recent Labs  Lab 09/08/24 1909 09/09/24 0448 09/10/24 0426 09/11/24 0425  WBC 5.9 5.1 3.7* 4.0  NEUTROABS 4.3  --   --  2.8  HGB 8.9* 8.7* 8.2* 8.4*  HCT 29.3* 28.5* 26.9* 27.9*  MCV 90.2 91.1 90.9 91.8  PLT 211 181 156 163    Basic Metabolic Panel: Recent Labs  Lab 09/08/24 1909 09/09/24 0448 09/10/24 0426 09/11/24 0425 09/12/24 0449  NA 142 142 140 139 138  K 2.9* 4.3 4.2 4.5 4.1  CL 100 103 101 99 97*  CO2 32 32 32 34* 35*  GLUCOSE 116* 95 84 82 114*  BUN 24* 23 21 21 22   CREATININE 1.66* 1.80* 1.51* 1.61* 1.64*  CALCIUM  8.9 8.5* 8.5* 8.9 8.9    CBG: No results for input(s): GLUCAP in the last 168 hours.  No results found for this or any previous visit (from the past 240 hours).   Radiology Studies: No results found.  Scheduled Meds:  ALPRAZolam   0.5 mg Oral BID   budesonide -glycopyrrolate -formoterol   2 puff Inhalation BID   diltiazem   120 mg Oral Daily   furosemide   40 mg Intravenous BID   levothyroxine   50 mcg Oral Q0600   lumateperone  tosylate  42 mg Oral QHS   multivitamin with minerals  1 tablet Oral Daily   potassium chloride  SA  40 mEq Oral Daily   rOPINIRole   3 mg Oral BID   rosuvastatin   20 mg Oral Daily   warfarin  2.5 mg Oral ONCE-1600   Warfarin - Pharmacist Dosing  Inpatient   Does not apply q1600   Continuous Infusions:   LOS: 4 days   Time spent: 55 mins  Amy Llewellyn Vicci, MD How to contact the [facility] Attending or Consulting provider 7A - 7P or covering provider during after hours 7P -7A, for this patient?  Check the care team in [facility] and look for a) attending/consulting [facility] provider listed and b) the [facility] team listed Log into [email or web address] to find provider on call.  Locate the [facility] provider you are looking for under [facility] and page to a number that you can be directly reached. If you still have difficulty reaching the provider, please page the [facility] (Director on Call) for the Hospitalists listed on amion for assistance.  09/12/2024, 11:17 AM

## 2024-09-12 NOTE — Consult Note (Addendum)
 PHARMACY - ANTICOAGULATION CONSULT NOTE  Pharmacy Consult for Warfarin Indication: mAVR & Afib  Allergies  Allergen Reactions   Penicillin G Itching   Penicillins Hives, Itching, Swelling and Rash   Dust Mite Extract Other (See Comments)    Seasonal allergies    Patient Measurements: Weight: 69.6 kg (153 lb 7 oz)  Vital Signs: Temp: 97.6 F (36.4 C) (11/24 0816) Temp Source: Oral (11/24 0816) BP: 114/68 (11/24 0816) Pulse Rate: 91 (11/24 0816)  Labs: Recent Labs    09/10/24 0426 09/10/24 0838 09/11/24 0425 09/12/24 0449  HGB 8.2*  --  8.4*  --   HCT 26.9*  --  27.9*  --   PLT 156  --  163  --   LABPROT 50.1* 47.6* 41.3* 34.5*  INR 5.2* 4.9* 4.1* 3.2*  CREATININE 1.51*  --  1.61* 1.64*    Estimated Creatinine Clearance: 31.3 mL/min (A) (by C-G formula based on SCr of 1.64 mg/dL (H)).   Medical History: Past Medical History:  Diagnosis Date   Anxiety disorder    Aortic stenosis    Status post St. Jude mechanical AVR 2007   Asthma    Atrial fibrillation (HCC)    Carcinoid tumor of colon (HCC) 2007   Chronic diastolic heart failure (HCC)    Coronary atherosclerosis of native coronary artery    Status post CABG 2007   GERD (gastroesophageal reflux disease)    History of colonoscopy 2003   Dr. 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 Shaaron, benign biopsies   RLS (restless legs syndrome)    Schatzki's ring     Medications:  Warfarin 3 mg daily except for 4 mg on Tuesday (TWD: 22 mg)  Last patient reported dose was 11/20 at noon  Assessment: 73 y/o F with medical history as above presenting with shortness of breath and CXR impression of pulmonary edema. Pharmacy consulted to dose warfarin while patient is admitted.  INR today is 5.6 > 6 > 5.2 > 4.1 > 3.2.  Within range, no bleeding noted. Conservative redosing with elevated INR on admission Hgb 8.2> 8.4, stable  Goal  of Therapy:  INR 2.5 to 3.5 with mAVR and Afib Monitor platelets by anticoagulation protocol: Yes   Plan:  Give 2.5 mg warfarin today PT INR daily Monitor for S/S of bleeding  Chancy Furigay PharmD Candidate 09/12/2024 8:53 AM

## 2024-09-12 NOTE — Plan of Care (Signed)
   Problem: Activity: Goal: Risk for activity intolerance will decrease Outcome: Progressing   Problem: Coping: Goal: Level of anxiety will decrease Outcome: Progressing

## 2024-09-12 NOTE — Progress Notes (Signed)
 Mobility Specialist Progress Note:    09/12/24 1220  Mobility  Activity Pivoted/transferred from bed to chair  Level of Assistance Minimal assist, patient does 75% or more (+2)  Assistive Device None  Distance Ambulated (ft) 2 ft  Range of Motion/Exercises Active;All extremities  Activity Response Tolerated well  Mobility Referral Yes  Mobility visit 1 Mobility  Mobility Specialist Start Time (ACUTE ONLY) 1220  Mobility Specialist Stop Time (ACUTE ONLY) 1240  Mobility Specialist Time Calculation (min) (ACUTE ONLY) 20 min   Pt received in bed, agreeable to mobility. Required MinA +2 to stand and transfer with no AD. Tolerated well, SOB throughout session. NT in room, all needs met.  Mabel Roll Mobility Specialist Please contact via Special Educational Needs Teacher or  Rehab office at [phone]

## 2024-09-12 NOTE — Plan of Care (Signed)
   Problem: Education: Goal: Knowledge of General Education information will improve Description Including pain rating scale, medication(s)/side effects and non-pharmacologic comfort measures Outcome: Progressing   Problem: Education: Goal: Knowledge of General Education information will improve Description Including pain rating scale, medication(s)/side effects and non-pharmacologic comfort measures Outcome: Progressing

## 2024-09-12 NOTE — Progress Notes (Signed)
 Mobility Specialist Progress Note:    09/12/24 1455  Mobility  Activity Pivoted/transferred from chair to bed  Level of Assistance Minimal assist, patient does 75% or more (+2)  Assistive Device None  Distance Ambulated (ft) 3 ft  Range of Motion/Exercises Active;All extremities  Activity Response Tolerated well  Mobility Referral Yes  Mobility visit 1 Mobility  Mobility Specialist Start Time (ACUTE ONLY) 1455  Mobility Specialist Stop Time (ACUTE ONLY) 1513  Mobility Specialist Time Calculation (min) (ACUTE ONLY) 18 min   Pt received in chair, requesting assistance to bed. Required MinA +2 to stand and transfer with no AD. Tolerated well, slightly confused. NT in room, all needs met.  [name] Mobility Specialist Please contact via Special Educational Needs Teacher or  Rehab office at [phone]

## 2024-09-12 NOTE — Progress Notes (Signed)
 Nurse at beside,patient out of bed to chair at this time.No c/o pain or discomfort at this time.Patient did however, say this telemetry box is off and it's been off for an hour,patient seemed irritable.Central telemetry was called and verified that patient was on the monitor, and had been on, this information was explained to the patient,who verbalized understanding.

## 2024-09-12 NOTE — Progress Notes (Addendum)
 Progress Note  Patient Name: [name] Date of Encounter: 09/12/2024  Primary Cardiologist: Jayson Sierras, MD  Subjective   No events.  Inpatient Medications    Scheduled Meds:  ALPRAZolam   0.5 mg Oral BID   budesonide -glycopyrrolate -formoterol   2 puff Inhalation BID   diltiazem   120 mg Oral Daily   furosemide   40 mg Intravenous BID   levothyroxine   50 mcg Oral Q0600   lumateperone  tosylate  42 mg Oral QHS   multivitamin with minerals  1 tablet Oral Daily   potassium chloride  SA  40 mEq Oral Daily   rOPINIRole   3 mg Oral BID   rosuvastatin   20 mg Oral Daily   warfarin  2.5 mg Oral ONCE-1600   Warfarin - Pharmacist Dosing Inpatient   Does not apply q1600   Continuous Infusions:  PRN Meds: acetaminophen  **OR** acetaminophen , albuterol , HYDROcodone -acetaminophen    Vital Signs    Vitals:   09/12/24 0406 09/12/24 0439 09/12/24 0717 09/12/24 0816  BP: 100/60   114/68  Pulse: 82   91  Resp: 18     Temp: 97.8 F (36.6 C)   97.6 F (36.4 C)  TempSrc: Oral   Oral  SpO2: 91%  93% 91%  Weight:  69.6 kg      Intake/Output Summary (Last 24 hours) at 09/12/2024 1026 Last data filed at 09/12/2024 0500 Gross per 24 hour  Intake 600 ml  Output 800 ml  Net -200 ml   Filed Weights   09/11/24 0418 09/11/24 0607 09/12/24 0439  Weight: 73.1 kg 69.2 kg 69.6 kg    Telemetry     Personally reviewed.  Rate controlled A-fib, HR 70 and 90s.  ECG    Not performed today.  Physical Exam   GEN: No acute distress.   Neck: Unable to examine due to poor habitus. Cardiac: RRR, no murmur, rub, or gallop.  Respiratory: Nonlabored. Clear to auscultation bilaterally. GI: Soft, nontender, bowel sounds present. MS: No edema; No deformity. Neuro:  Nonfocal. Psych: Alert and oriented x 3. Normal affect.  Labs    Chemistry Recent Labs  Lab 09/08/24 1909 09/09/24 0448 09/10/24 0426 09/11/24 0425 09/12/24 0449  NA 142   < > 140 139 138  K 2.9*   < > 4.2 4.5 4.1   CL 100   < > 101 99 97*  CO2 32   < > 32 34* 35*  GLUCOSE 116*   < > 84 82 114*  BUN 24*   < > 21 21 22   CREATININE 1.66*   < > 1.51* 1.61* 1.64*  CALCIUM  8.9   < > 8.5* 8.9 8.9  PROT 6.8  --   --   --   --   ALBUMIN 4.1  --   --   --   --   AST 29  --   --   --   --   ALT 13  --   --   --   --   ALKPHOS 91  --   --   --   --   BILITOT 0.7  --   --   --   --   GFRNONAA 32*   < > 36* 34* 33*  ANIONGAP 10   < > 7 7 7    < > = values in this interval not displayed.     Hematology Recent Labs  Lab 09/09/24 0448 09/10/24 0426 09/11/24 0425  WBC 5.1 3.7* 4.0  RBC 3.13* 2.96* 3.04*  HGB  8.7* 8.2* 8.4*  HCT 28.5* 26.9* 27.9*  MCV 91.1 90.9 91.8  MCH 27.8 27.7 27.6  MCHC 30.5 30.5 30.1  RDW 14.8 14.6 14.5  PLT 181 156 163    Cardiac EnzymesNo results for input(s): TROPONINIHS in the last 720 hours.  BNP Recent Labs  Lab 09/08/24 1909  PROBNP 5,004.0*     DDimerNo results for input(s): DDIMER in the last 168 hours.   Radiology    No results found.  Assessment & Plan    Acute on chronic diastolic heart failure - Presented with worsening DOE and abdominal distention.  proBNP elevated at 5004.  Chest x-ray showed pulmonary edema and chronic right pleural effusion. - On IV Lasix  40 mg twice daily (home med torsemide  50 mg twice daily).  Increase IV Lasix  from 40 mg to 80 mg twice daily.  Bicarb uptrending 35 today, chloride downtrending 97 today.  Patient tachypneic during exam.  Obtain chest x-ray today.  1 year ago, cardiology office visit documented weight of 135 pounds.  She is 153 pounds today.  Increasing IV Lasix , as above.  Add IV acetazolamide .  Serum creatinine stable at 1.6.  Mechanical AVR Supratherapeutic INR - INR 5.6 on admission, today 3.2. - Coumadin  management per pharmacy.  CAD with CABG in 2007 - not on aspirin due to Coumadin  use. - Continue Crestor  20 mg nightly.  Permanent atrial fibrillation - Telemetry reviewed rate controlled A-fib, HR  ranging between 70 and 90s.  Continue Cardizem  120 mg once daily and Coumadin  per pharmacy.  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  Chronic hypoxic respiratory failure on 4L home O2   30-minute spent in reviewing prior medical records, reports/imaging/more than 3 labs, discussion and documentation.   Signed, Diannah SHAUNNA Maywood, MD  09/12/2024, 10:26 AM

## 2024-09-12 NOTE — Progress Notes (Signed)
 Nurse at bedside,patient alert and oriented to person.place,and situation,a little confused to time.Patient c/o pain to back rated a 7,a achy,off and on pain.Oxygen  at 5 liters via nasal canula. Plan of care on going.

## 2024-09-12 NOTE — Plan of Care (Signed)

## 2024-09-12 NOTE — Progress Notes (Signed)
 Refused red clips x3 attempts. Will inform oncoming shift of pts refusal.

## 2024-09-12 NOTE — Progress Notes (Signed)
 Patient refused red clips this am. Pt stated that her back hurt and she did not want to get up or to be sat up in the bed and a machine placed on her to measure fluid. Pt stated she refused and request to be left alone so she can  let my pain pill kick in so I can sleep. Will inform oncoming shift.

## 2024-09-12 NOTE — Evaluation (Addendum)
 Occupational Therapy Evaluation Patient Details Name: [name] MRN: [id number] DOB: 1951-09-28 Today's Date: 09/12/2024   History of Present Illness   Carneshia Raker  is a 73 y.o. female,  with longstanding history of COPD and chronic respiratory failure typically on 4 L/min supplemental oxygen , chronic atrial fibrillation, HTN, HLD, CAD status post 2V CABG with mechanical aortic valve replacement, at baseline patient 4 L oxygen  at home.-   - Presents to ED secondary to complaints of shortness of breath, reports progressive over the last 3 days, reports initially with activity, currently reports orthopnea as well, reports compliance with her home diuretics, and salt restriction, denies cough, fever or chills, no chest pain, reports baseline lower extremity edema  - In ED saturating 85% on her 4 L nasal cannula, requiring up to 6 L, creatinine lower than baseline at 1.66, chest x-ray significant for cardiomegaly, chronic right pleural effusion and pulmonary edema, proBNP significantly elevated, INR is elevated at 5.6, potassium low at 2.9, [facility] hospitalist consulted to admit.     Clinical Impressions Pt admitted for concerns listed above. PTA pt reported that she was independent with all ADL's and functional mobility in her home. At this time pt demonstrating increased weakness, requiring up to mod assist for BADL's and functional mobility, using a RW to maintain balance. Recommending skilled OT, however pt is requesting to go home with sons help. Acute OT will continue to follow.      If plan is discharge home, recommend the following:   A lot of help with bathing/dressing/bathroom;A lot of help with walking and/or transfers;Assistance with cooking/housework;Help with stairs or ramp for entrance     Functional Status Assessment   Patient has had a recent decline in their functional status and demonstrates the ability to make significant improvements in function in a reasonable and  predictable amount of time.     Equipment Recommendations   None recommended by OT     Recommendations for Other Services         Precautions/Restrictions   Precautions Precautions: Fall Recall of Precautions/Restrictions: Intact Restrictions Weight Bearing Restrictions Per Provider Order: No     Mobility Bed Mobility Overal bed mobility: Needs Assistance Bed Mobility: Supine to Sit, Sit to Supine     Supine to sit: Mod assist Sit to supine: Mod assist   General bed mobility comments: Heavy mod assist to bring feet into and out of bed.    Transfers Overall transfer level: Needs assistance Equipment used: Rolling walker (2 wheels), 1 person hand held assist Transfers: Sit to/from Stand Sit to Stand: Min assist           General transfer comment: Heavy min A from elevated surface      Balance Overall balance assessment: Needs assistance Sitting-balance support: No upper extremity supported, Feet supported Sitting balance-Leahy Scale: Fair Sitting balance - Comments: seated EOB   Standing balance support: During functional activity, Single extremity supported, Bilateral upper extremity supported Standing balance-Leahy Scale: Poor Standing balance comment: Reliant on RW                           ADL either performed or assessed with clinical judgement   ADL Overall ADL's : Needs assistance/impaired Eating/Feeding: Set up;Sitting   Grooming: Contact guard assist;Standing   Upper Body Bathing: Set up;Sitting   Lower Body Bathing: Moderate assistance;Sitting/lateral leans;Sit to/from stand   Upper Body Dressing : Set up;Sitting   Lower Body Dressing: Moderate assistance;Sitting/lateral  leans;Sit to/from stand   Toilet Transfer: Minimal assistance;Moderate assistance;Ambulation   Toileting- Clothing Manipulation and Hygiene: Moderate assistance;Sitting/lateral lean;Sit to/from stand         General ADL Comments: Requiring increased  assist with all ADL's requiring standing and functional mobility due to weakness and balance deficits     Vision Baseline Vision/History: 1 Wears glasses Ability to See in Adequate Light: 0 Adequate Patient Visual Report: No change from baseline Vision Assessment?: No apparent visual deficits     Perception         Praxis         Pertinent Vitals/Pain Pain Assessment Pain Assessment: 0-10 Pain Score: 5  Pain Location: Low back pain Pain Descriptors / Indicators: Grimacing, Moaning Pain Intervention(s): Limited activity within patient's tolerance, Monitored during session, Repositioned     Extremity/Trunk Assessment Upper Extremity Assessment Upper Extremity Assessment: Generalized weakness   Lower Extremity Assessment Lower Extremity Assessment: Defer to PT evaluation   Cervical / Trunk Assessment Cervical / Trunk Assessment: Kyphotic   Communication Communication Communication: No apparent difficulties Factors Affecting Communication: Hearing impaired   Cognition Arousal: Alert Behavior During Therapy: WFL for tasks assessed/performed Cognition: No apparent impairments                               Following commands: Intact       Cueing  General Comments   Cueing Techniques: Verbal cues;Tactile cues  DOE and increased work of breath with any movement   Exercises     Shoulder Instructions      Home Living Family/patient expects to be discharged to:: Private residence Living Arrangements: Children Available Help at Discharge: Family;Available PRN/intermittently Type of Home: Mobile home Home Access: Stairs to enter Entrance Stairs-Number of Steps: 2 Entrance Stairs-Rails: Can reach both Home Layout: One level     Bathroom Shower/Tub: Producer, Television/film/video: Handicapped height Bathroom Accessibility: Yes How Accessible: Accessible via walker Home Equipment: Agricultural Consultant (2 wheels);BSC/3in1;Wheelchair - manual;Shower  seat;Grab bars - tub/shower          Prior Functioning/Environment Prior Level of Function : Independent/Modified Independent             Mobility Comments: Reports as household ambulator without AD, reports no falls, does not drive ADLs Comments: Reports independence with ADL but having inc difficulty recently, son assists with iADLS    OT Problem List: Decreased range of motion;Decreased strength;Decreased activity tolerance;Impaired balance (sitting and/or standing);Decreased safety awareness;Cardiopulmonary status limiting activity;Obesity;Impaired UE functional use   OT Treatment/Interventions: Self-care/ADL training;Therapeutic exercise;Neuromuscular education;Energy conservation;DME and/or AE instruction;Manual therapy;Therapeutic activities;Patient/family education;Balance training      OT Goals(Current goals can be found in the care plan section)   Acute Rehab OT Goals Patient Stated Goal: [place] home OT Goal Formulation: With patient Time For Goal Achievement: 09/26/24 Potential to Achieve Goals: Good ADL Goals Pt Will Perform Grooming: with modified independence;standing Pt Will Perform Lower Body Bathing: with set-up;sit to/from stand;sitting/lateral leans Pt Will Perform Lower Body Dressing: with set-up;sit to/from stand;sitting/lateral leans Pt Will Transfer to Toilet: with supervision;ambulating Pt Will Perform Toileting - Clothing Manipulation and hygiene: with modified independence;sitting/lateral leans;sit to/from stand   OT Frequency:  Min 1X/week    Co-evaluation              AM-PAC OT 6 Clicks Daily Activity     Outcome Measure Help from another person eating meals?: A Little Help from another person taking care  of personal grooming?: A Little Help from another person toileting, which includes using toliet, bedpan, or urinal?: A Lot Help from another person bathing (including washing, rinsing, drying)?: A Lot Help from another person to put on  and taking off regular upper body clothing?: A Little Help from another person to put on and taking off regular lower body clothing?: A Lot 6 Click Score: 15   End of Session Equipment Utilized During Treatment: Rolling walker (2 wheels);Oxygen  Nurse Communication: Mobility status  Activity Tolerance: Patient tolerated treatment well Patient left: in bed;with call bell/phone within reach;with bed alarm set  OT Visit Diagnosis: Unsteadiness on feet (R26.81);Other abnormalities of gait and mobility (R26.89);Muscle weakness (generalized) (M62.81)                Time: 9160-9098 OT Time Calculation (min): 22 min Charges:  OT General Charges $OT Visit: 1 Visit OT Evaluation $OT Eval Low Complexity: 1 Low  Bisma Klett Thelbert, OTR/L [facility]  Gisselle Galvis Jillyn Thelbert 09/12/2024, 4:36 PM

## 2024-09-13 ENCOUNTER — Ambulatory Visit: Payer: Self-pay | Admitting: Nurse Practitioner

## 2024-09-13 DIAGNOSIS — N1832 Chronic kidney disease, stage 3b: Secondary | ICD-10-CM | POA: Diagnosis not present

## 2024-09-13 DIAGNOSIS — R791 Abnormal coagulation profile: Secondary | ICD-10-CM | POA: Diagnosis not present

## 2024-09-13 DIAGNOSIS — I5033 Acute on chronic diastolic (congestive) heart failure: Secondary | ICD-10-CM | POA: Diagnosis not present

## 2024-09-13 DIAGNOSIS — I4821 Permanent atrial fibrillation: Secondary | ICD-10-CM | POA: Diagnosis not present

## 2024-09-13 LAB — BASIC METABOLIC PANEL WITH GFR
Anion gap: 4 — ABNORMAL LOW (ref 5–15)
BUN: 21 mg/dL (ref 8–23)
CO2: 39 mmol/L — ABNORMAL HIGH (ref 22–32)
Calcium: 8.7 mg/dL — ABNORMAL LOW (ref 8.9–10.3)
Chloride: 97 mmol/L — ABNORMAL LOW (ref 98–111)
Creatinine, Ser: 1.53 mg/dL — ABNORMAL HIGH (ref 0.44–1.00)
GFR, Estimated: 36 mL/min — ABNORMAL LOW (ref >60)
Glucose, Bld: 94 mg/dL (ref 70–99)
Potassium: 3.9 mmol/L (ref 3.5–5.1)
Sodium: 140 mmol/L (ref 135–145)

## 2024-09-13 LAB — PROTIME-INR
INR: 3.1 — ABNORMAL HIGH (ref 0.8–1.2)
Prothrombin Time: 33.7 s — ABNORMAL HIGH (ref 11.4–15.2)

## 2024-09-13 MED ORDER — DAPAGLIFLOZIN PROPANEDIOL 10 MG PO TABS
10.0000 mg | ORAL_TABLET | Freq: Every day | ORAL | Status: DC
  Administered 2024-09-13 – 2024-09-20 (×8): 10 mg via ORAL
  Filled 2024-09-13 (×8): qty 1

## 2024-09-13 MED ORDER — WARFARIN SODIUM 2 MG PO TABS
2.0000 mg | ORAL_TABLET | Freq: Once | ORAL | Status: AC
  Administered 2024-09-13: 2 mg via ORAL
  Filled 2024-09-13: qty 1

## 2024-09-13 NOTE — Progress Notes (Signed)
 PROGRESS NOTE   [name]  FMW:[id number] DOB: 1951-05-24 DOA: 09/08/2024 PCP: Renato Dorothey HERO, NP   Chief Complaint  Patient presents with   Shortness of Breath   Level of care: Telemetry  Brief Admission History:  73 y.o. female with longstanding history of COPD and chronic respiratory failure typically on 4 L/min supplemental oxygen , chronic atrial fibrillation, HTN, HLD, CAD status post 2V CABG with mechanical aortic valve replacement, presented to ED with complaints of worsening shortness of breath over the last 3 days, also reports orthopnea.  Per patient she was compliant with her home diuretics and salt restrictions.  On presentation she was hypoxic at 85% and requiring up to 6 L of oxygen .  Chest x-ray with cardiomegaly, chronic right pleural effusion and pulmonary edema.  Significantly elevated proBNP at 5004, INR elevated at 5.6, potassium at 2.9.  Patient was admitted for acute on chronic hypoxic respiratory failure secondary to acute on chronic HFpEF.  Started on IV diuresis.  Cardiology was consulted.  Patient also has a possible LA mass which was noted on prior echoes.  Patient did not pursue TEE or further investigation.   Assessment and Plan:  Acute HFpEF 2D echo 04/28/2024 significant for preserved EF at 60% with indeterminate diastolic parameters, but significant hypertrophy and anterior ventricular septum flattening. proBNP significantly elevated at 5004, chest x-ray with pulmonary vascular congestion and pleural effusion. Cardiology was consulted and patient was started on IV Lasix   -Continue with IV Lasix  80 mg twice daily with potassium -Daily weight and BMP -Strict intake and output Filed Weights   09/11/24 0607 09/12/24 0439 09/13/24 0500  Weight: 69.2 kg 69.6 kg 68.8 kg    Intake/Output Summary (Last 24 hours) at 09/13/2024 1049 Last data filed at 09/13/2024 9389 Gross per 24 hour  Intake --  Output 1880 ml  Net -1880 ml    RLS (restless  legs syndrome) - Continue Requip   Anxiety disorder - Continue home meds  Coronary artery disease due to lipid rich plaque S/p CABG in 2007.  No chest pain. - Continue with home statin - Not on any antiplatelet due to the need for anticoagulation with warfarin  Permanent atrial fibrillation  Heart rate currently controlled. - Continue with home Cardizem  - warfarin per pharmacy dosing with daily INR test ordered  Acute on chronic respiratory failure with hypoxia  Patient was using up to 6 L of oxygen  on admission, history of 4 L at baseline. - Continue with supplemental oxygen -wean to baseline as tolerated  Supratherapeutic INR INR initially at 6.0, down to 3.2.   No bleeding at this time. - Appreciate pharmacy help with monitoring - per pharm D now that INR is 3.2, give warfarin 2 mg today - daily PT/INR testing   Hypothyroidism - Continue home Synthroid   CKD (chronic kidney disease) stage 3, GFR 30-59 ml/min \ - Patient with history of CKD stage IIIb, slight increase in creatinine but remained within her baseline. - Monitor renal function - Avoid nephrotoxins  Hypokalemia - repleted  - Continue to monitor and replete as needed while she is being diuresed  S/P AVR (aortic valve replacement) INR supratherapeutic at 6.0 with goal of 2.5-3.5 due to mechanical aortic valve. - warfarin dosing per pharm D  GERD - Continue with PPI  DVT prophylaxis: warfarin Code Status: DNR  Family Communication:  Disposition: Home with HH (declining SNF)  Consultants:  cardiology Procedures:  Antimicrobials:    Subjective: Pt says she still feels SOB, she has been urinating  more frequently in last 24 hours  Objective: Vitals:   09/12/24 2114 09/13/24 0500 09/13/24 0539 09/13/24 0739  BP: 122/68  111/66   Pulse: 82  76   Resp: 16  19   Temp: (!) 97.5 F (36.4 C)  97.7 F (36.5 C)   TempSrc: Oral  Oral   SpO2: 98%  96% 96%  Weight:  68.8 kg      Intake/Output Summary  (Last 24 hours) at 09/13/2024 1048 Last data filed at 09/13/2024 0610 Gross per 24 hour  Intake --  Output 1880 ml  Net -1880 ml   Filed Weights   09/11/24 0607 09/12/24 0439 09/13/24 0500  Weight: 69.2 kg 69.6 kg 68.8 kg   Examination:  General exam: Appears calm and comfortable  Respiratory system: bibasilar crackles.  Respiratory effort normal. Cardiovascular system: normal S1 & S2 heard. No JVD, murmurs, rubs, gallops or clicks. No pedal edema. Gastrointestinal system: Abdomen is nondistended, soft and nontender. No organomegaly or masses felt. Normal bowel sounds heard. Central nervous system: Alert and oriented. No focal neurological deficits. Extremities: Symmetric 5 x 5 power. Skin: No rashes, lesions or ulcers. Psychiatry: Judgement and insight appear normal. Mood & affect appropriate.   Data Reviewed: I have personally reviewed following labs and imaging studies  CBC: Recent Labs  Lab 09/08/24 1909 09/09/24 0448 09/10/24 0426 09/11/24 0425  WBC 5.9 5.1 3.7* 4.0  NEUTROABS 4.3  --   --  2.8  HGB 8.9* 8.7* 8.2* 8.4*  HCT 29.3* 28.5* 26.9* 27.9*  MCV 90.2 91.1 90.9 91.8  PLT 211 181 156 163    Basic Metabolic Panel: Recent Labs  Lab 09/09/24 0448 09/10/24 0426 09/11/24 0425 09/12/24 0449 09/13/24 0445  NA 142 140 139 138 140  K 4.3 4.2 4.5 4.1 3.9  CL 103 101 99 97* 97*  CO2 32 32 34* 35* 39*  GLUCOSE 95 84 82 114* 94  BUN 23 21 21 22 21   CREATININE 1.80* 1.51* 1.61* 1.64* 1.53*  CALCIUM  8.5* 8.5* 8.9 8.9 8.7*    CBG: No results for input(s): GLUCAP in the last 168 hours.  No results found for this or any previous visit (from the past 240 hours).   Radiology Studies: DG Chest Port 1 View Result Date: 09/12/2024 EXAM: 1 VIEW(S) XRAY OF THE CHEST 09/12/2024 12:20:00 PM COMPARISON: 09/08/2024 CLINICAL HISTORY: SOB (shortness of breath) FINDINGS: LINES, TUBES AND DEVICES: Aortic valve replacement is noted. LUNGS AND PLEURA: Diffuse interstitial  opacities favoring interstitial pulmonary edema, similar to prior. Similar right pleural effusion. No pneumothorax. HEART AND MEDIASTINUM: Stable cardiomegaly. Aortic atherosclerosis is present. BONES AND SOFT TISSUES: Prior median sternotomy is noted. The frontal projection is rotated to the right, reducing diagnostic sensitivity and specificity. IMPRESSION: 1. Diffuse interstitial opacities favoring interstitial pulmonary edema, similar to prior. 2. Similar right pleural effusion. 3. Cardiomegaly, stable. Electronically signed by: Ryan Salvage MD 09/12/2024 09:09 PM EST RP Workstation: [id number]    Scheduled Meds:  ALPRAZolam   0.5 mg Oral BID   budesonide -glycopyrrolate -formoterol   2 puff Inhalation BID   dapagliflozin  propanediol  10 mg Oral Daily   diltiazem   120 mg Oral Daily   furosemide   80 mg Intravenous BID   levothyroxine   50 mcg Oral Q0600   lumateperone  tosylate  42 mg Oral QHS   multivitamin with minerals  1 tablet Oral Daily   potassium chloride  SA  40 mEq Oral Daily   rOPINIRole   3 mg Oral BID   rosuvastatin   20 mg Oral  Daily   warfarin  2 mg Oral ONCE-1600   Warfarin - Pharmacist Dosing Inpatient   Does not apply q1600   Continuous Infusions:   LOS: 5 days   Time spent: 55 mins  Preslie Depasquale Vicci, MD How to contact the [facility] Attending or Consulting provider 7A - 7P or covering provider during after hours 7P -7A, for this patient?  Check the care team in [facility] and look for a) attending/consulting [facility] provider listed and b) the [facility] team listed Log into [email or web address] to find provider on call.  Locate the [facility] provider you are looking for under [facility] and page to a number that you can be directly reached. If you still have difficulty reaching the provider, please page the [facility] (Director on Call) for the Hospitalists listed on amion for assistance.  09/13/2024, 10:48 AM

## 2024-09-13 NOTE — Plan of Care (Signed)

## 2024-09-13 NOTE — Consult Note (Addendum)
 PHARMACY - ANTICOAGULATION CONSULT NOTE  Pharmacy Consult for Warfarin Indication: mAVR & Afib  Allergies  Allergen Reactions   Penicillin G Itching   Penicillins Hives, Itching, Swelling and Rash   Dust Mite Extract Other (See Comments)    Seasonal allergies    Patient Measurements: Weight: 68.8 kg (151 lb 10.8 oz)  Vital Signs: Temp: 97.7 F (36.5 C) (11/25 0539) Temp Source: Oral (11/25 0539) BP: 111/66 (11/25 0539) Pulse Rate: 76 (11/25 0539)  Labs: Recent Labs    09/11/24 0425 09/12/24 0449 09/13/24 0445  HGB 8.4*  --   --   HCT 27.9*  --   --   PLT 163  --   --   LABPROT 41.3* 34.5* 33.7*  INR 4.1* 3.2* 3.1*  CREATININE 1.61* 1.64* 1.53*    Estimated Creatinine Clearance: 33.5 mL/min (A) (by C-G formula based on SCr of 1.53 mg/dL (H)).   Medical History: Past Medical History:  Diagnosis Date   Anxiety disorder    Aortic stenosis    Status post St. Jude mechanical AVR 2007   Asthma    Atrial fibrillation (HCC)    Carcinoid tumor of colon (HCC) 2007   Chronic diastolic heart failure (HCC)    Coronary atherosclerosis of native coronary artery    Status post CABG 2007   GERD (gastroesophageal reflux disease)    History of colonoscopy 2003   Dr. 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 Shaaron, benign biopsies   RLS (restless legs syndrome)    Schatzki's ring     Medications:  Warfarin 3 mg daily except for 4 mg on Tuesday (TWD: 22 mg)  Last patient reported dose was 11/20 at noon  Assessment: 73 y/o F with medical history as above presenting with shortness of breath and CXR impression of pulmonary edema. Pharmacy consulted to dose warfarin while patient is admitted.  INR today is 5.6 > 6 > 5.2 > 4.1 > 3.2 > 3.1.  Within range, no bleeding noted. Conservative redosing with elevated INR on admission Hgb 8.2 > 8.4, stable  Goal of Therapy:  INR 2.5 to 3.5 with  mAVR and Afib Monitor platelets by anticoagulation protocol: Yes   Plan:  Give 2 mg warfarin today PT INR daily Monitor for S/S of bleeding  Chancy Uvaldo Rybacki PharmD Candidate 09/13/2024 7:58 AM

## 2024-09-13 NOTE — Progress Notes (Signed)
 Physical Therapy Treatment Patient Details Name: [name] MRN: [id number] DOB: 07-Nov-1950 Today's Date: 09/13/2024   History of Present Illness [name]  is a 73 y.o. female,  with longstanding history of COPD and chronic respiratory failure typically on 4 L/min supplemental oxygen , chronic atrial fibrillation, HTN, HLD, CAD status post 2V CABG with mechanical aortic valve replacement, at baseline patient 4 L oxygen  at home.-   - Presents to ED secondary to complaints of shortness of breath, reports progressive over the last 3 days, reports initially with activity, currently reports orthopnea as well, reports compliance with her home diuretics, and salt restriction, denies cough, fever or chills, no chest pain, reports baseline lower extremity edema  - In ED saturating 85% on her 4 L nasal cannula, requiring up to 6 L, creatinine lower than baseline at 1.66, chest x-ray significant for cardiomegaly, chronic right pleural effusion and pulmonary edema, proBNP significantly elevated, INR is elevated at 5.6, potassium low at 2.9, [facility] hospitalist consulted to admit.    PT Comments  Patient agreeable to PT treatment. Patient was received supine in bed with HOB elevated. Patient's son arrives towards beginning of session with clothes and cleaning supplies. Patient and son report decision to go home with HHPT. Pt agrees to go over HEP for LE strengthening while sitting bedside. Requires min assist to get to EOB. Performs seated LE Exercises with verbal cueing and visual demonstration while demonstrating improved core strength through maintaining seated balance independently throughout.  Min assist to return to bed. HEP print out left with patient and son. Patient will benefit from continued skilled physical therapy acutely and in recommended venue in order to address/improve overall function and independence.     If plan is discharge home, recommend the following: A little help with walking and/or  transfers;A little help with bathing/dressing/bathroom;Assist for transportation;Assistance with cooking/housework;Help with stairs or ramp for entrance   Can travel by private vehicle        Equipment Recommendations  None recommended by PT    Recommendations for Other Services       Precautions / Restrictions Precautions Precautions: Fall Recall of Precautions/Restrictions: Intact Restrictions Weight Bearing Restrictions Per Provider Order: No     Mobility  Bed Mobility Overal bed mobility: Needs Assistance Bed Mobility: Supine to Sit, Sit to Supine     Supine to sit: Min assist Sit to supine: Min assist   General bed mobility comments: min assist to get into sitting EOB this date with HOB elevated. assisted with managing LE and scooting    Transfers                   General transfer comment: Not performed this date as pt's son arrives and they begin to get ready for discharge from hospital    Ambulation/Gait               General Gait Details: Not performed this date as pt's son arrives and they begin to get ready for discharge from hospital   Stairs             Wheelchair Mobility     Tilt Bed    Modified Rankin (Stroke Patients Only)       Balance Overall balance assessment: Needs assistance Sitting-balance support: Feet supported, Bilateral upper extremity supported Sitting balance-Leahy Scale: Good Sitting balance - Comments: seated EOB       Standing balance comment: Not performed this date as pt's son arrives and they begin to get  ready for discharge from hospital                            Communication Communication Communication: No apparent difficulties Factors Affecting Communication: Hearing impaired;Other (comment) (HOH)  Cognition Arousal: Alert Behavior During Therapy: WFL for tasks assessed/performed                             Following commands: Intact      Cueing Cueing  Techniques: Verbal cues, Tactile cues  Exercises General Exercises - Lower Extremity Long Arc Quad: AROM, Strengthening, Both, 10 reps, Seated Hip ABduction/ADduction: AROM, Strengthening, Both, 10 reps, Seated Hip Flexion/Marching: AROM, Strengthening, Both, 10 reps, Seated Toe Raises: AROM, Strengthening, Both, 10 reps, Seated Heel Raises: AROM, Strengthening, Both, 10 reps, Seated    General Comments        Pertinent Vitals/Pain Pain Assessment Pain Assessment: No/denies pain    Home Living                          Prior Function            PT Goals (current goals can now be found in the care plan section) Acute Rehab PT Goals Patient Stated Goal: Return home PT Goal Formulation: With patient Time For Goal Achievement: 09/23/24 Potential to Achieve Goals: Good Progress towards PT goals: Progressing toward goals    Frequency    Min 3X/week      PT Plan      Co-evaluation              AM-PAC PT 6 Clicks Mobility   Outcome Measure  Help needed turning from your back to your side while in a flat bed without using bedrails?: A Little Help needed moving from lying on your back to sitting on the side of a flat bed without using bedrails?: A Lot Help needed moving to and from a bed to a chair (including a wheelchair)?: A Little Help needed standing up from a chair using your arms (e.g., wheelchair or bedside chair)?: A Little Help needed to walk in hospital room?: A Little Help needed climbing 3-5 steps with a railing? : A Lot 6 Click Score: 16    End of Session   Activity Tolerance: Patient tolerated treatment well Patient left: with call bell/phone within reach;with family/visitor present;in bed   PT Visit Diagnosis: Unsteadiness on feet (R26.81);Other abnormalities of gait and mobility (R26.89);Muscle weakness (generalized) (M62.81);Difficulty in walking, not elsewhere classified (R26.2);Pain     Time: 8889-8874 PT Time Calculation (min)  (ACUTE ONLY): 15 min  Charges:    $Therapeutic Exercise: 8-22 mins PT General Charges $$ ACUTE PT VISIT: 1 Visit                      2:02 PM, 09/13/24 [name], PT, DPT [place] with [facility]

## 2024-09-13 NOTE — Progress Notes (Signed)
 Heart Failure Nurse Navigator Progress Note  PCP: [name] HERO, NP PCP-Cardiologist: Amy Sierras, MD Admission Diagnosis: Acute on chronic congestive heart failure, unspecified heart failure type [facility]) Hypokalemia  [facility] [name]: [name] called remotely in room 323 @ [facility]. She was unable to hear due to not having hearing aids.  Communicated all CHF education with her son [name]) via telephone.  Also scheduled TOC appointment @ [name] with [name] since he provides her transportation.  Admitted from: Home  Presentation:   [name] is a 73 y.o. female.  She presented with increased shortness of breath.  [name] is on 4L O2 [place] as her baseline. On arrival was 85% RA. [name] felt as if her weight is up.  She has a history of A-fib, CHF, COPD, HTN, HLD, CAD status post 2V CABG with mechanical aortic valve replacement. Chest x-ray- Pulmonary edema, right pleural effusion, Cardiomegaly. Pro-BNP 5,004.    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 Jude mechanical AVR 2007   Asthma    Atrial fibrillation (HCC)    Carcinoid tumor of colon (HCC) 2007   Chronic diastolic heart failure (HCC)    Coronary atherosclerosis of native coronary artery    Status post CABG 2007   GERD (gastroesophageal reflux disease)    History of colonoscopy 2003   Dr. 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 Shaaron, benign biopsies   RLS (restless legs syndrome)    Schatzki's ring      Social History   Socioeconomic History   Marital status: Married    Spouse name: Not on file   Number of children: 1   Years of education: stopped in 10th grade   Highest education level: GED or equivalent  Occupational History   Occupation: Retired  Tobacco Use   Smoking status: Former    Current packs/day: 0.00     Average packs/day: 1 Mendez/day for 20.0 years (20.0 ttl pk-yrs)    Types: Cigarettes    Start date: 10/20/1968    Quit date: 10/20/1988    Years since quitting: 35.9   Smokeless tobacco: Never  Vaping Use   Vaping status: Never Used  Substance and Sexual Activity   Alcohol use: No    Alcohol/week: 0.0 standard drinks of alcohol   Drug use: No   Sexual activity: Yes    Partners: Male    Birth control/protection: None    Comment: spouse  Other Topics Concern   Not on file  Social History Narrative   Not on file   Social Drivers of Health   Financial Resource Strain: Low Risk (12/27/2023)   Received from [facility]   Overall Financial Resource Strain (CARDIA)    Difficulty of Paying Living Expenses: Not hard at all  Food Insecurity: No Food Insecurity (09/08/2024)   Hunger Vital Sign    Worried About Running Out of Food in the Last Year: Never true    Ran Out of Food in the Last Year: Never true  Transportation Needs: No Transportation Needs (09/08/2024)   PRAPARE - Administrator, Civil Service (Medical): No    Lack of Transportation (Non-Medical): No  Physical Activity: Inactive (12/27/2023)   Received from [facility]   Exercise Vital Sign  On average, how many days per week do you engage in moderate to strenuous exercise (like a brisk walk)?: 0 days    On average, how many minutes do you engage in exercise at this level?: 0 min  Stress: Stress Concern Present (12/27/2023)   Received from [facility] of Occupational Health - Occupational Stress Questionnaire    Feeling of Stress : Rather much  Social Connections: Moderately Isolated (09/08/2024)   Social Connection and Isolation Panel    Frequency of Communication with Friends and Family: More than three times a week    Frequency of Social Gatherings with Friends and Family: Three times a week    Attends Religious Services: Never    Active Member of Clubs or Organizations: No     Attends Engineer, Structural: Never    Marital Status: Married   Water Engineer and Provision:  Detailed education and instructions provided on heart failure disease management including the following:  Signs and symptoms of Heart Failure When to call the physician Importance of daily weights Low sodium diet Fluid restriction Medication management Anticipated future follow-up appointments  [name] education given on each of the above topics.  [name] acknowledges understanding via teach back method and acceptance of all instructions.  Education Materials:  Living Better With Heart Failure Booklet, HF zone tool, & Daily Weight Tracker Tool.  [name] has scale at home: Yes [name] has pill box at home: Yes    High Risk Criteria for Readmission and/or Poor [name] Outcomes: Heart failure hospital admissions (last 6 months): 2  No Show rate: 5% Difficult social situation: [name] wears hearing aids. Demonstrates medication adherence: Yes Primary Language: English Literacy level: Reading, Writing & Comprehension  Barriers of Care:   Daily Weights-[name] not currently doing them  Considerations/Referrals:  Referral made to Heart Failure Pharmacist Stewardship: N/A Referral made to Heart Failure CSW/NCM TOC: No Referral made to Heart & Vascular TOC clinic: Yes. 09/26/2024 @ 2:45 PM  Items for Follow-up on DC/TOC: Daily Weights Diet & Fluid Restrictions Continued Heart Failure Education  Amy Pines, RN, BSN [facility] Heart Failure Navigator Secure Chat Only

## 2024-09-13 NOTE — Progress Notes (Signed)
 Rounding Note   Patient Name: [name] Date of Encounter: 09/13/2024  [facility] Cardiologist: Jayson Sierras, MD   Subjective  Says her breathing is about the same. Still having orthopnea. No chest pain or palpitations.   Scheduled Meds:  acetaZOLAMIDE   500 mg Intravenous Daily   ALPRAZolam   0.5 mg Oral BID   budesonide -glycopyrrolate -formoterol   2 puff Inhalation BID   diltiazem   120 mg Oral Daily   furosemide   80 mg Intravenous BID   levothyroxine   50 mcg Oral Q0600   lumateperone  tosylate  42 mg Oral QHS   multivitamin with minerals  1 tablet Oral Daily   potassium chloride  SA  40 mEq Oral Daily   rOPINIRole   3 mg Oral BID   rosuvastatin   20 mg Oral Daily   warfarin  2 mg Oral ONCE-1600   Warfarin - Pharmacist Dosing Inpatient   Does not apply q1600   Continuous Infusions:  PRN Meds: acetaminophen  **OR** acetaminophen , albuterol , HYDROcodone -acetaminophen    Vital Signs  Vitals:   09/12/24 2114 09/13/24 0500 09/13/24 0539 09/13/24 0739  BP: 122/68  111/66   Pulse: 82  76   Resp: 16  19   Temp: (!) 97.5 F (36.4 C)  97.7 F (36.5 C)   TempSrc: Oral  Oral   SpO2: 98%  96% 96%  Weight:  68.8 kg      Intake/Output Summary (Last 24 hours) at 09/13/2024 0906 Last data filed at 09/13/2024 0610 Gross per 24 hour  Intake --  Output 1880 ml  Net -1880 ml      09/13/2024    5:00 AM 09/12/2024    4:39 AM 09/11/2024    6:07 AM  Last 3 Weights  Weight (lbs) 151 lb 10.8 oz 153 lb 7 oz 152 lb 8.9 oz  Weight (kg) 68.8 kg 69.6 kg 69.2 kg      Telemetry  Rate-controlled atrial fibrillation, HR in 70's.  - Personally Reviewed  ECG  No new tracings.   Physical Exam  GEN: Pleasant female appearing in no acute distress.   Neck: No JVD Cardiac: Irregularly irregular. Crisp, mechanical valve sounds noted.  Respiratory: Rales along bases bilaterally.  GI: Soft, nontender, non-distended  MS: Trace lower extremity edema bilaterally; No  deformity. Neuro:  Nonfocal  Psych: Normal affect   Labs High Sensitivity Troponin:  No results for input(s): TROPONINIHS in the last 720 hours.   Chemistry Recent Labs  Lab 09/08/24 1909 09/09/24 0448 09/11/24 0425 09/12/24 0449 09/13/24 0445  NA 142   < > 139 138 140  K 2.9*   < > 4.5 4.1 3.9  CL 100   < > 99 97* 97*  CO2 32   < > 34* 35* 39*  GLUCOSE 116*   < > 82 114* 94  BUN 24*   < > 21 22 21   CREATININE 1.66*   < > 1.61* 1.64* 1.53*  CALCIUM  8.9   < > 8.9 8.9 8.7*  PROT 6.8  --   --   --   --   ALBUMIN 4.1  --   --   --   --   AST 29  --   --   --   --   ALT 13  --   --   --   --   ALKPHOS 91  --   --   --   --   BILITOT 0.7  --   --   --   --  GFRNONAA 32*   < > 34* 33* 36*  ANIONGAP 10   < > 7 7 4*   < > = values in this interval not displayed.    Lipids No results for input(s): CHOL, TRIG, HDL, LABVLDL, LDLCALC, CHOLHDL in the last 168 hours.  Hematology Recent Labs  Lab 09/09/24 0448 09/10/24 0426 09/11/24 0425  WBC 5.1 3.7* 4.0  RBC 3.13* 2.96* 3.04*  HGB 8.7* 8.2* 8.4*  HCT 28.5* 26.9* 27.9*  MCV 91.1 90.9 91.8  MCH 27.8 27.7 27.6  MCHC 30.5 30.5 30.1  RDW 14.8 14.6 14.5  PLT 181 156 163   Thyroid  No results for input(s): TSH, FREET4 in the last 168 hours.  BNP Recent Labs  Lab 09/08/24 1909  PROBNP 5,004.0*    DDimer No results for input(s): DDIMER in the last 168 hours.   Radiology  DG Chest Port 1 View Result Date: 09/12/2024 EXAM: 1 VIEW(S) XRAY OF THE CHEST 09/12/2024 12:20:00 PM COMPARISON: 09/08/2024 CLINICAL HISTORY: SOB (shortness of breath) FINDINGS: LINES, TUBES AND DEVICES: Aortic valve replacement is noted. LUNGS AND PLEURA: Diffuse interstitial opacities favoring interstitial pulmonary edema, similar to prior. Similar right pleural effusion. No pneumothorax. HEART AND MEDIASTINUM: Stable cardiomegaly. Aortic atherosclerosis is present. BONES AND SOFT TISSUES: Prior median sternotomy is noted. The frontal  projection is rotated to the right, reducing diagnostic sensitivity and specificity. IMPRESSION: 1. Diffuse interstitial opacities favoring interstitial pulmonary edema, similar to prior. 2. Similar right pleural effusion. 3. Cardiomegaly, stable. Electronically signed by: Ryan Salvage MD 09/12/2024 09:09 PM EST RP Workstation: [id number]    Cardiac Studies  Echocardiogram: 04/2024 IMPRESSIONS     1. Left ventricular ejection fraction, by estimation, is 60 to 65%. The  left ventricle has normal function. The left ventricle has no regional  wall motion abnormalities. There is mild left ventricular hypertrophy.  Left ventricular diastolic parameters  are indeterminate. There is the interventricular septum is flattened in  systole and diastole, consistent with right ventricular pressure and  volume overload.   2. Right ventricular systolic function was not well visualized. The right  ventricular size is not well visualized. There is moderately elevated  pulmonary artery systolic pressure.   3. 3.4 x 2.4 cm round density adjacent to the interatrial septum within  the left atrium, limited visualization. Consider TEE or cardiac CT for  further evaluation. . Left atrial size was severely dilated.   4. Right atrial size was severely dilated.   5. The mitral valve is abnormal. Mild mitral valve regurgitation. No  evidence of mitral stenosis. Moderate mitral annular calcification.   6. The tricuspid valve is abnormal. Tricuspid valve regurgitation is  moderate to severe.   7. There is a 25 mm St. Jude mechanical valve present in the aortic  position.     . The aortic valve has been repaired/replaced. Aortic valve  regurgitation is not visualized. No aortic stenosis is present. There is a  25 mm St. Jude mechanical valve present in the aortic position. Procedure  Date: 2007. Aortic valve mean gradient  measures 5.0 mmHg.   8. The inferior vena cava is dilated in size with >50% respiratory   variability, suggesting right atrial pressure of 8 mmHg.    Patient Profile    73 y.o. female w/ PMH of aortic stenosis (s/p St. Jude mechanical AVR in 2007 with repair of ascending aortic aneurysm), CAD with prior CABG in 2007, permanent atrial fibrillation, chronic HFpEF, HTN, HLD, Stage 3 CKD, possible LA mass (noted on  echo in 08/2023 and outpatient Cardiac CT recommended once clinically stable but never obtained), COPD and chronic hypoxic respiratory failure who is currently admitted for an acute CHF exacerbation.   Assessment & Plan   1. Acute on Chronic HFpEF - Presented with worsening dyspnea and abdominal distention over the past 3 to 4 days. proBNP elevated at 5004. CXR showed pulmonary edema and a chronic right pleural effusion. It was unclear what dose of Torsemide  she was taking at home as her pharmacy reported she filled a prescription for Torsemide  100 mg daily but this was filled in 06/2024 for a 30-day supply and has not been refilled since and she was previously listed as Torsemide  60 mg twice daily and possibly on 50 mg twice daily at one point. Raises concern for medication noncompliance prior to admission.  - Was initially on IV Lasix  40mg  BID and this was titrated to 80mg  BID yesterday. Also started on IV Acetazolamide  500mg  daily. Recorded net output of -3.0 L and weight variable as at 151 lbs on admission then up to 161 lbs and now at 151 lbs. Previously 134-135 lbs in 05/2024. Reviewed with Dr. Debera and will stop Acetazolamide  and start Farxiga  10mg  daily (previously had urinary retention during prior admission but no UTI). Continue IV Lasix  at 80mg  BID. Repeat BMET in AM.    2. Aortic Stenosis - She has a St. Jude mechanical AVR which was placed in 2007 with repair of her ascending aorta at that time. Echocardiogram in 04/2024 showed normal valve function with mean gradient 5 mmHg. - INR initially significantly elevated at the time of admission but improving, at 3.1  today. Pharmacy following.    3.  CAD - She previously underwent CABG in 2007 at the time of AVR. She denies any recent anginal symptoms. - Continue Crestor  20 mg daily and she is not on ASA given the need for anticoagulation.   4. Permanent atrial fibrillation - Telemetry shows HR is well-controlled in the 70's. She has been continued on Cardizem  CD 120 mg daily for rate-control. - INR initially supratherpeutic at 6.0, at 3.1 today. Pharmacy following.    5. Stage III CKD - Baseline creatinine 1.4 - 1.5 and peaked at 2.38 during admission 04/2024. Overall stable this admission and at 1.53 today.    6. Possible LA Mass - Noted on prior echocardiogram in 08/2023 and again noted on repeat imaging in 04/2024. TEE was not pursued given her respiratory status and she did not wish to pursue a Coronary CTA as an outpatient by review of notes.    7. COPD/Chronic Hypoxic Respiratory Failure - Remains on supplemental oxygen  at 5L and on 4L [place] at baseline.   For questions or updates, please contact [facility] Please consult [email or web address] for contact info under   Signed, Laymon CHRISTELLA Qua, PA-C  09/13/2024, 9:06 AM

## 2024-09-14 DIAGNOSIS — I5033 Acute on chronic diastolic (congestive) heart failure: Secondary | ICD-10-CM | POA: Diagnosis not present

## 2024-09-14 DIAGNOSIS — E876 Hypokalemia: Secondary | ICD-10-CM | POA: Diagnosis not present

## 2024-09-14 DIAGNOSIS — I4821 Permanent atrial fibrillation: Secondary | ICD-10-CM | POA: Diagnosis not present

## 2024-09-14 DIAGNOSIS — Z951 Presence of aortocoronary bypass graft: Secondary | ICD-10-CM | POA: Diagnosis not present

## 2024-09-14 LAB — BASIC METABOLIC PANEL WITH GFR
Anion gap: 6 (ref 5–15)
BUN: 23 mg/dL (ref 8–23)
CO2: 38 mmol/L — ABNORMAL HIGH (ref 22–32)
Calcium: 8.9 mg/dL (ref 8.9–10.3)
Chloride: 95 mmol/L — ABNORMAL LOW (ref 98–111)
Creatinine, Ser: 1.56 mg/dL — ABNORMAL HIGH (ref 0.44–1.00)
GFR, Estimated: 35 mL/min — ABNORMAL LOW (ref >60)
Glucose, Bld: 78 mg/dL (ref 70–99)
Potassium: 3.7 mmol/L (ref 3.5–5.1)
Sodium: 139 mmol/L (ref 135–145)

## 2024-09-14 LAB — MAGNESIUM: Magnesium: 2.7 mg/dL — ABNORMAL HIGH (ref 1.7–2.4)

## 2024-09-14 LAB — PROTIME-INR
INR: 3.4 — ABNORMAL HIGH (ref 0.8–1.2)
Prothrombin Time: 35.6 s — ABNORMAL HIGH (ref 11.4–15.2)

## 2024-09-14 MED ORDER — WARFARIN SODIUM 1 MG PO TABS
1.0000 mg | ORAL_TABLET | Freq: Once | ORAL | Status: AC
  Administered 2024-09-14: 1 mg via ORAL
  Filled 2024-09-14: qty 1

## 2024-09-14 NOTE — Consult Note (Signed)
 PHARMACY - ANTICOAGULATION CONSULT NOTE  Pharmacy Consult for Warfarin Indication: mAVR & Afib  Allergies  Allergen Reactions   Penicillin G Itching   Penicillins Hives, Itching, Swelling and Rash   Dust Mite Extract Other (See Comments)    Seasonal allergies    Patient Measurements: Weight: 67.4 kg (148 lb 9.4 oz)  Vital Signs: Temp: 97.8 F (36.6 C) (11/26 0532) Temp Source: Oral (11/26 0532) BP: 113/65 (11/26 0532) Pulse Rate: 69 (11/26 0532)  Labs: Recent Labs    09/12/24 0449 09/13/24 0445 09/14/24 0428  LABPROT 34.5* 33.7* 35.6*  INR 3.2* 3.1* 3.4*  CREATININE 1.64* 1.53* 1.56*    Estimated Creatinine Clearance: 32.9 mL/min (A) (by C-G formula based on SCr of 1.56 mg/dL (H)).   Medical History: Past Medical History:  Diagnosis Date   Anxiety disorder    Aortic stenosis    Status post St. Jude mechanical AVR 2007   Asthma    Atrial fibrillation (HCC)    Carcinoid tumor of colon (HCC) 2007   Chronic diastolic heart failure (HCC)    Coronary atherosclerosis of native coronary artery    Status post CABG 2007   GERD (gastroesophageal reflux disease)    History of colonoscopy 2003   Dr. 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 Shaaron, benign biopsies   RLS (restless legs syndrome)    Schatzki's ring     Medications:  Warfarin 3 mg daily except for 4 mg on Tuesday (TWD: 22 mg)  Last patient reported dose was 11/20 at noon  Assessment: 73 y/o F with medical history as above presenting with shortness of breath and CXR impression of pulmonary edema. Pharmacy consulted to dose warfarin while patient is admitted.  INR today is 5.6 > 6 > 5.2 > 4.1 > 3.2 > 3.1> 3.4.  Within range, no bleeding noted. Conservative redosing with elevated INR on admission Hgb 8.2 > 8.4, stable  Goal of Therapy:  INR 2.5 to 3.5 with mAVR and Afib Monitor platelets by anticoagulation protocol:  Yes   Plan:  Give 1 mg warfarin today PT INR daily Monitor for S/S of bleeding  Cherlyn Boers, BS Pharm D, BCPS Clinical Pharmacist 09/14/2024 8:38 AM

## 2024-09-14 NOTE — NC FL2 (Signed)
 [place]  MEDICAID FL2 LEVEL OF CARE FORM     IDENTIFICATION  Patient Name: [name] Birthdate: 1951-02-24 Sex: female Admission Date (Current Location): 09/08/2024  [facility] and [place] Number:  [employer] and Address:  [facility],  [address] [place] [address]      Provider Number: [id number]  Attending Physician Name and Address:  Vicci Afton CROME, MD  Relative Name and Phone Number:  VINCENZO ALM Rhody)  [phone]    Current Level of Care: Hospital Recommended Level of Care: Skilled Nursing Facility Prior Approval Number:    Date Approved/Denied:   PASRR Number: [id number]  Discharge Plan: SNF    Current Diagnoses: Patient Active Problem List   Diagnosis Date Noted   Chronic hypoxic respiratory failure (HCC) 09/12/2024   Hx of CABG 09/12/2024   Long term (current) use of anticoagulants 09/12/2024   Coronary artery disease involving coronary bypass graft of native heart without angina pectoris 09/12/2024   History of mechanical aortic valve replacement 09/12/2024   Acute on chronic diastolic heart failure (HCC) 09/12/2024   Anxiety disorder    RLS (restless legs syndrome)    Acute on chronic diastolic CHF (congestive heart failure) (HCC) 09/08/2024   Permanent atrial fibrillation (HCC) 05/09/2024   Left atrial mass 05/09/2024   AKI (acute kidney injury) 05/09/2024   Coronary artery disease due to lipid rich plaque 05/09/2024   Acute heart failure with preserved ejection fraction (HFpEF) (HCC) 05/09/2024   Acute respiratory failure with hypoxia and hypercapnia (HCC) 05/09/2024   Acute on chronic respiratory failure (HCC) 05/05/2024   Acute on chronic respiratory failure with hypoxia (HCC) 09/09/2023   Rib fractures 06/11/2023   Right tibial fracture 06/11/2023   Acute diastolic CHF (congestive heart failure) (HCC) 05/29/2019   Itching 05/29/2019   Acute hypokalemia 05/05/2019   Acute CHF (congestive heart failure)  (HCC) 04/08/2019   Acute CHF (HCC) 04/07/2019   Asthma, chronic, unspecified asthma severity, with acute exacerbation 04/07/2019   Benign essential HTN    CHF (congestive heart failure) (HCC) 07/26/2018   Transaminitis 07/23/2018   Acute on chronic respiratory failure with hypoxia and hypercapnia (HCC) 06/19/2018   Acute respiratory failure with hypoxia (HCC)    Supratherapeutic INR 05/03/2018   CHF exacerbation (HCC) 05/02/2018   Acute metabolic encephalopathy 05/02/2018   CKD (chronic kidney disease) stage 3, GFR 30-59 ml/min (HCC) 05/02/2018   Hypothyroidism 05/02/2018   Generalized weakness 05/02/2018   Physical deconditioning    Coronary artery disease of bypass graft of native heart with stable angina pectoris    Renal failure (ARF), acute on chronic    Acute on chronic respiratory failure with hypercapnia (HCC) 03/24/2018   Hypomagnesemia 03/24/2018   Hypokalemia 03/24/2018   Hypophosphatemia 03/24/2018   Atrial fibrillation with RVR (HCC) 03/24/2018   Elevated troponin 03/24/2018   Coughing 01/14/2018   Chronic anemia 01/14/2018   Hx of adenomatous colonic polyps 01/14/2018   Acute on chronic diastolic congestive heart failure (HCC) 12/17/2016   COPD with acute exacerbation (HCC) 12/17/2016   Bilateral leg edema 07/05/2014   Encounter for therapeutic drug monitoring 11/15/2013   Chronic venous insufficiency 08/26/2011   Chronic anticoagulation 01/11/2011   Peptic stricture of esophagus 10/04/2010   GERD 09/16/2010   DYSPHAGIA 09/16/2010   S/P AVR (aortic valve replacement) 06/05/2009   Hyperlipidemia 06/04/2009   Coronary atherosclerosis of native coronary artery 06/04/2009   Atrial fibrillation, chronic (HCC) 06/04/2009    Orientation RESPIRATION BLADDER Height & Weight  Self, Situation, Place  O2 (5L)   Weight: 148 lb 9.4 oz (67.4 kg) Height:     BEHAVIORAL SYMPTOMS/MOOD NEUROLOGICAL BOWEL NUTRITION STATUS      Continent Diet (See DC summary)  AMBULATORY  STATUS COMMUNICATION OF NEEDS Skin   Extensive Assist Verbally Normal                       Personal Care Assistance Level of Assistance  Bathing, Feeding, Dressing Bathing Assistance: Maximum assistance Feeding assistance: Limited assistance Dressing Assistance: Maximum assistance     Functional Limitations Info  Sight, Hearing, Speech Sight Info: Adequate (reading glasses) Hearing Info: Adequate Speech Info: Adequate    SPECIAL CARE FACTORS FREQUENCY  PT (By licensed PT), OT (By licensed OT)     PT Frequency: 5x/wk OT Frequency: 5x/wk            Contractures Contractures Info: Not present    Additional Factors Info  Code Status, Allergies, Psychotropic Code Status Info: DNR Allergies Info: Penicillin G, Penicillins, Dust Mite Extract Psychotropic Info: Xanax          Current Medications (09/14/2024):  This is the current hospital active medication list Current Facility-Administered Medications  Medication Dose Route Frequency Provider Last Rate Last Admin   acetaminophen  (TYLENOL ) tablet 650 mg  650 mg Oral Q6H PRN Elgergawy, Dawood S, MD   650 mg at 09/12/24 9086   Or   acetaminophen  (TYLENOL ) suppository 650 mg  650 mg Rectal Q6H PRN Elgergawy, Dawood S, MD       albuterol  (PROVENTIL ) (2.5 MG/3ML) 0.083% nebulizer solution 2.5 mg  2.5 mg Nebulization Q2H PRN Elgergawy, Dawood S, MD   2.5 mg at 09/09/24 0827   ALPRAZolam  (XANAX ) tablet 0.5 mg  0.5 mg Oral BID Elgergawy, Dawood S, MD   0.5 mg at 09/14/24 9147   budesonide -glycopyrrolate -formoterol  (BREZTRI ) 160-9-4.8 MCG/ACT inhaler 2 puff  2 puff Inhalation BID Elgergawy, Dawood S, MD   2 puff at 09/14/24 9340   dapagliflozin  propanediol (FARXIGA ) tablet 10 mg  10 mg Oral Daily Strader, Brittany M, PA-C   10 mg at 09/14/24 9146   diltiazem  (CARDIZEM  CD) 24 hr capsule 120 mg  120 mg Oral Daily Elgergawy, Dawood S, MD   120 mg at 09/14/24 0854   furosemide  (LASIX ) injection 80 mg  80 mg Intravenous BID  Mallipeddi, Vishnu P, MD   80 mg at 09/14/24 0852   HYDROcodone -acetaminophen  (NORCO) 10-325 MG per tablet 1 tablet  1 tablet Oral Q6H PRN Elgergawy, Dawood S, MD   1 tablet at 09/14/24 0543   levothyroxine  (SYNTHROID ) tablet 50 mcg  50 mcg Oral Q0600 Elgergawy, Dawood S, MD   50 mcg at 09/14/24 0545   lumateperone  tosylate (CAPLYTA ) capsule 42 mg  42 mg Oral QHS Elgergawy, Dawood S, MD   42 mg at 09/13/24 2150   multivitamin with minerals tablet 1 tablet  1 tablet Oral Daily Elgergawy, Dawood S, MD   1 tablet at 09/14/24 0854   potassium chloride  SA (KLOR-CON  M) CR tablet 40 mEq  40 mEq Oral Daily Elgergawy, Dawood S, MD   40 mEq at 09/14/24 0901   rOPINIRole  (REQUIP ) tablet 3 mg  3 mg Oral BID Elgergawy, Dawood S, MD   3 mg at 09/14/24 9146   rosuvastatin  (CRESTOR ) tablet 20 mg  20 mg Oral Daily Elgergawy, Dawood S, MD   20 mg at 09/14/24 0853   warfarin (COUMADIN ) tablet 1 mg  1 mg Oral ONCE-1600 Johnson, Clanford  L, MD       Warfarin - Pharmacist Dosing Inpatient   Does not apply q1600 Vicci Afton CROME, MD   Given at 09/12/24 1753     Discharge Medications: Please see discharge summary for a list of discharge medications.  Relevant Imaging Results:  Relevant Lab Results:   Additional Information SSN: [id number]  Hoy DELENA Bigness, LCSW

## 2024-09-14 NOTE — Plan of Care (Signed)
  Problem: Education: Goal: Knowledge of General Education information will improve Description: Including pain rating scale, medication(s)/side effects and non-pharmacologic comfort measures Outcome: Progressing   Problem: Health Behavior/Discharge Planning: Goal: Ability to manage health-related needs will improve Outcome: Progressing   Problem: Clinical Measurements: Goal: Ability to maintain clinical measurements within normal limits will improve Outcome: Progressing Goal: Respiratory complications will improve Outcome: Progressing Goal: Cardiovascular complication will be avoided Outcome: Progressing   Problem: Activity: Goal: Risk for activity intolerance will decrease Outcome: Progressing   Problem: Activity: Goal: Capacity to carry out activities will improve Outcome: Progressing   Problem: Cardiac: Goal: Ability to achieve and maintain adequate cardiopulmonary perfusion will improve Outcome: Progressing

## 2024-09-14 NOTE — Progress Notes (Signed)
 Physical Therapy Treatment Patient Details Name: [name] MRN: [id number] DOB: Apr 04, 1951 Today's Date: 09/14/2024   History of Present Illness [name]  is a 73 y.o. female,  with longstanding history of COPD and chronic respiratory failure typically on 4 L/min supplemental oxygen , chronic atrial fibrillation, HTN, HLD, CAD status post 2V CABG with mechanical aortic valve replacement, at baseline patient 4 L oxygen  at home.-   - Presents to ED secondary to complaints of shortness of breath, reports progressive over the last 3 days, reports initially with activity, currently reports orthopnea as well, reports compliance with her home diuretics, and salt restriction, denies cough, fever or chills, no chest pain, reports baseline lower extremity edema  - In ED saturating 85% on her 4 L nasal cannula, requiring up to 6 L, creatinine lower than baseline at 1.66, chest x-ray significant for cardiomegaly, chronic right pleural effusion and pulmonary edema, proBNP significantly elevated, INR is elevated at 5.6, potassium low at 2.9, [facility] hospitalist consulted to admit.    PT Comments  Patient present seated in chair (assisted by nursing staff) and agreeable for therapy.  Patient demonstrates fair/good return for completing BLE ROM/strengthening exercises requiring verbal cueing, tolerated taking a  few steps forward/backwards before having to sit due to fatigue, SOB with SpO2 dropping from 94% to 83% while on 4 LPM. Patient tolerated staying up in chair with SpO2 increasing above 90% - her son present. Patient will benefit from continued skilled physical therapy in hospital and recommended venue below to increase strength, balance, endurance for safe ADLs and gait.      If plan is discharge home, recommend the following: Assist for transportation;Assistance with cooking/housework;Help with stairs or ramp for entrance;A lot of help with walking and/or transfers;A lot of help with  bathing/dressing/bathroom   Can travel by private vehicle     Yes  Equipment Recommendations  None recommended by PT    Recommendations for Other Services       Precautions / Restrictions Precautions Precautions: Fall Recall of Precautions/Restrictions: Intact Restrictions Weight Bearing Restrictions Per Provider Order: No     Mobility  Bed Mobility               General bed mobility comments: Patient presents up in chair (assisted by nursing staff)    Transfers Overall transfer level: Needs assistance Equipment used: Rolling walker (2 wheels) Transfers: Sit to/from Stand, Bed to chair/wheelchair/BSC Sit to Stand: Min assist, Mod assist   Step pivot transfers: Min assist, Mod assist       General transfer comment: had difficulty completing sit to stands due to BLE weakness    Ambulation/Gait Ambulation/Gait assistance: Min assist, Mod assist Gait Distance (Feet): 6 Feet Assistive device: Rolling walker (2 wheels) Gait Pattern/deviations: Decreased step length - right, Decreased step length - left, Decreased stride length Gait velocity: slow     General Gait Details: limited to a few slow labored steps forward/backwards at bedside due to c/o fatigue, SOB with SpO2 dropping from 94% to 83% while on 4 LPM O2   Stairs             Wheelchair Mobility     Tilt Bed    Modified Rankin (Stroke Patients Only)       Balance Overall balance assessment: Needs assistance Sitting-balance support: Feet supported, No upper extremity supported Sitting balance-Leahy Scale: Fair Sitting balance - Comments: fair/good seated in chair   Standing balance support: Reliant on assistive device for balance, During functional activity, Bilateral  upper extremity supported Standing balance-Leahy Scale: Poor Standing balance comment: using RW                            Communication Communication Communication: No apparent difficulties  Cognition  Arousal: Alert Behavior During Therapy: WFL for tasks assessed/performed                             Following commands: Intact      Cueing Cueing Techniques: Verbal cues, Tactile cues, Gestural cues  Exercises General Exercises - Lower Extremity Long Arc Quad: AROM, Strengthening, Both, 10 reps, Seated Hip Flexion/Marching: Seated, AROM, Strengthening, Both, 10 reps Toe Raises: Seated, AROM, Strengthening, Both, 10 reps Heel Raises: Seated, AROM, Strengthening, Both, 10 reps    General Comments        Pertinent Vitals/Pain Pain Assessment Pain Assessment: No/denies pain    Home Living                          Prior Function            PT Goals (current goals can now be found in the care plan section) Acute Rehab PT Goals Patient Stated Goal: Return home PT Goal Formulation: With patient/family Time For Goal Achievement: 09/23/24 Potential to Achieve Goals: Good Progress towards PT goals: Progressing toward goals    Frequency    Min 3X/week      PT Plan      Co-evaluation              AM-PAC PT 6 Clicks Mobility   Outcome Measure  Help needed turning from your back to your side while in a flat bed without using bedrails?: A Little Help needed moving from lying on your back to sitting on the side of a flat bed without using bedrails?: A Lot Help needed moving to and from a bed to a chair (including a wheelchair)?: A Lot Help needed standing up from a chair using your arms (e.g., wheelchair or bedside chair)?: A Lot Help needed to walk in hospital room?: A Lot Help needed climbing 3-5 steps with a railing? : A Lot 6 Click Score: 13    End of Session Equipment Utilized During Treatment: Oxygen  Activity Tolerance: Patient tolerated treatment well;Patient limited by fatigue Patient left: in chair;with call bell/phone within reach;with family/visitor present Nurse Communication: Mobility status PT Visit Diagnosis: Unsteadiness  on feet (R26.81);Other abnormalities of gait and mobility (R26.89);Muscle weakness (generalized) (M62.81);Difficulty in walking, not elsewhere classified (R26.2)     Time: 8557-8493 PT Time Calculation (min) (ACUTE ONLY): 24 min  Charges:    $Therapeutic Exercise: 8-22 mins $Therapeutic Activity: 8-22 mins PT General Charges $$ ACUTE PT VISIT: 1 Visit                     3:20 PM, 09/14/24 Lynwood Music, MPT Physical Therapist with [facility] [phone] office [phone] mobile phone

## 2024-09-14 NOTE — Progress Notes (Signed)
 Progress Note  Patient Name: [name] Date of Encounter: 09/14/2024  Primary Cardiologist: Jayson Sierras, MD  Interval Summary  Chart reviewed.  She states that she has been breathing more comfortably.  Continues to diurese with additional net urine output of approximately 1800 cc last 24 hours.  Weight coming down.  Vital Signs  Vitals:   09/13/24 2024 09/14/24 0500 09/14/24 0532 09/14/24 0659  BP: 120/67  113/65   Pulse: 82  69   Resp: 18  20   Temp: 98.1 F (36.7 C)  97.8 F (36.6 C)   TempSrc: Oral  Oral   SpO2: 97%  91% 94%  Weight:  67.4 kg      Intake/Output Summary (Last 24 hours) at 09/14/2024 0856 Last data filed at 09/14/2024 0534 Gross per 24 hour  Intake 200 ml  Output 1950 ml  Net -1750 ml   Filed Weights   09/12/24 0439 09/13/24 0500 09/14/24 0500  Weight: 69.6 kg 68.8 kg 67.4 kg    Physical Exam  GEN: No acute distress.   Neck: No JVD. Cardiac: Irregularly irregular with crisp mechanical prosthetic valve sound in S2. Respiratory: Nonlabored.  Few crackles at the bases. GI: Soft, nontender, bowel sounds present. MS: Improving lower leg and ankle edema.  ECG/Telemetry  Telemetry reviewed showing atrial fibrillation.  Labs  Chemistry Recent Labs  Lab 09/08/24 1909 09/09/24 0448 09/12/24 0449 09/13/24 0445 09/14/24 0428  NA 142   < > 138 140 139  K 2.9*   < > 4.1 3.9 3.7  CL 100   < > 97* 97* 95*  CO2 32   < > 35* 39* 38*  GLUCOSE 116*   < > 114* 94 78  BUN 24*   < > 22 21 23   CREATININE 1.66*   < > 1.64* 1.53* 1.56*  CALCIUM  8.9   < > 8.9 8.7* 8.9  PROT 6.8  --   --   --   --   ALBUMIN 4.1  --   --   --   --   AST 29  --   --   --   --   ALT 13  --   --   --   --   ALKPHOS 91  --   --   --   --   BILITOT 0.7  --   --   --   --   GFRNONAA 32*   < > 33* 36* 35*  ANIONGAP 10   < > 7 4* 6   < > = values in this interval not displayed.    Hematology Recent Labs  Lab 09/09/24 0448 09/10/24 0426 09/11/24 0425   WBC 5.1 3.7* 4.0  RBC 3.13* 2.96* 3.04*  HGB 8.7* 8.2* 8.4*  HCT 28.5* 26.9* 27.9*  MCV 91.1 90.9 91.8  MCH 27.8 27.7 27.6  MCHC 30.5 30.5 30.1  RDW 14.8 14.6 14.5  PLT 181 156 163    Assessment & Plan  1.  Acute on chronic HFpEF.  LVEF 60 to 65% by echocardiogram in July.  RVSP moderately elevated at that time.  She continues to improve clinically with IV diuresis, additional net urine output of approximately 1800 cc last 24 hours.  Not yet at baseline..   2.  Aortic stenosis status post St. Jude mechanical AVR.  Mean AV gradient 5 mmHg by echocardiogram in July, no significant aortic regurgitation at that time.   3.  Permanent atrial fibrillation, continues on Coumadin  with  therapeutic INR.  Heart rate controlled.   4.  CKD stage IIIb, creatinine 1.56 with GFR 35.  Continue with current regimen including Lasix  80 mg twice daily, Farxiga  10 mg daily, Cardizem  CD 120 mg daily, potassium supplement, and Coumadin  per pharmacy.  Weight coming down, today 148 pounds.  Would aim for baseline weight in the mid to high 130s if possible.  Continue to follow renal function.  For questions or updates, please contact [facility] Please consult [email or web address] for contact info under   Signed, Jayson Sierras, MD  09/14/2024, 8:56 AM

## 2024-09-14 NOTE — TOC Progression Note (Signed)
 Transition of Care [facility]) - Progression Note    Patient Details  Name: [name] MRN: [id number] Date of Birth: September 11, 1951  [facility] CM/SW Contact  Hoy DELENA Bigness, LCSW Phone Number: 09/14/2024, 3:19 PM  Clinical Narrative:    Received call from pt's son who states that he spoke to his mother about SNF placement and let her know she would be unable to return home until after completing STR as he is unable to care for her at her current functioning status. Pt agreeable to placement and family prefers placement at [facility]. Referrals have been faxed out. Auth to be requested.    Expected Discharge Plan: Home w Home Health Services Barriers to Discharge: Continued Medical Work up               Expected Discharge Plan and Services In-house Referral: Clinical Social Work Discharge Planning Services: NA Post Acute Care Choice: Home Health Living arrangements for the past 2 months: Single Family Home                 DME Arranged: N/A DME Agency: NA                   Social Drivers of Health (SDOH) Interventions SDOH Screenings   Food Insecurity: No Food Insecurity (09/08/2024)  Housing: Unknown (09/08/2024)  Transportation Needs: No Transportation Needs (09/08/2024)  Utilities: Not At Risk (09/08/2024)  Financial Resource Strain: Low Risk (12/27/2023)   Received from [facility]  Physical Activity: Inactive (12/27/2023)   [facility] from [facility]  Social Connections: Moderately Isolated (09/08/2024)  Stress: Stress Concern Present (12/27/2023)   [facility]  Tobacco Use: Medium Risk (09/08/2024)  Health Literacy: Low Risk (05/18/2024)   Received from [facility]    Readmission Risk Interventions    09/09/2024    3:42 PM 09/10/2023    2:02 PM  Readmission Risk Prevention Plan  Transportation Screening Complete Complete  PCP or Specialist Appt within 3-5 Days Complete   HRI or Home Care Consult Complete  Complete  Social Work Consult for Recovery Care Planning/Counseling Complete Complete  Palliative Care Screening Not Applicable Not Applicable  Medication Review Oceanographer) Complete Complete

## 2024-09-14 NOTE — Progress Notes (Signed)
 PROGRESS NOTE   [name]  FMW:[id number] DOB: [birth year] DOA: 09/08/2024 PCP: Renato Dorothey HERO, NP   Chief Complaint  Patient presents with   Shortness of Breath   Level of care: Telemetry  Brief Admission History:  73 y.o. female with longstanding history of COPD and chronic respiratory failure typically on 4 L/min supplemental oxygen , chronic atrial fibrillation, HTN, HLD, CAD status post 2V CABG with mechanical aortic valve replacement, presented to ED with complaints of worsening shortness of breath over the last 3 days, also reports orthopnea.  Per patient she was compliant with her home diuretics and salt restrictions.  On presentation she was hypoxic at 85% and requiring up to 6 L of oxygen .  Chest x-ray with cardiomegaly, chronic right pleural effusion and pulmonary edema.  Significantly elevated proBNP at 5004, INR elevated at 5.6, potassium at 2.9.  Patient was admitted for acute on chronic hypoxic respiratory failure secondary to acute on chronic HFpEF.  Started on IV diuresis.  Cardiology was consulted.  Patient also has a possible LA mass which was noted on prior echoes.  Patient did not pursue TEE or further investigation.   Assessment and Plan:  Acute HFpEF 2D echo 04/28/2024 significant for preserved EF at 60% with indeterminate diastolic parameters, but significant hypertrophy and anterior ventricular septum flattening. proBNP significantly elevated at 5004, chest x-ray with pulmonary vascular congestion and pleural effusion. Cardiology was consulted and patient was started on IV Lasix   -Continue with IV Lasix  80 mg twice daily with potassium -Daily weight and BMP -Strict intake and output Filed Weights   09/12/24 0439 09/13/24 0500 09/14/24 0500  Weight: 69.6 kg 68.8 kg 67.4 kg    Intake/Output Summary (Last 24 hours) at 09/14/2024 1145 Last data filed at 09/14/2024 1011 Gross per 24 hour  Intake 400 ml  Output 2650 ml  Net -2250 ml   RLS (restless  legs syndrome) - Continue Requip   Anxiety disorder - Continue home meds  Coronary artery disease due to lipid rich plaque S/p CABG in 2007.  No chest pain. - Continue with home statin - Not on any antiplatelet due to the need for anticoagulation with warfarin  Permanent atrial fibrillation  Heart rate currently controlled. - Continue with home Cardizem  - warfarin per pharmacy dosing with daily INR test ordered  Acute on chronic respiratory failure with hypoxia  Patient was using up to 6 L of oxygen  on admission, history of 4 L at baseline. - Continue with supplemental oxygen -wean to baseline as tolerated  Supratherapeutic INR INR initially at 6.0, down to 3.2.   No bleeding at this time. - Appreciate pharmacy help with monitoring - per pharm D now that INR is 3.2, give warfarin 2 mg today - daily PT/INR testing   Hypothyroidism - Continue home Synthroid   CKD (chronic kidney disease) stage 3, GFR 30-59 ml/min \ - Patient with history of CKD stage IIIb, slight increase in creatinine but remained within her baseline. - Monitor renal function - Avoid nephrotoxins  Hypokalemia - repleted  - Continue to monitor and replete as needed while she is being diuresed  S/P AVR (aortic valve replacement) INR supratherapeutic at 6.0 with goal of 2.5-3.5 due to mechanical aortic valve. - warfarin dosing per pharm D  GERD - Continue with PPI  DVT prophylaxis: warfarin Code Status: DNR  Family Communication:  Disposition: Home with HH (declining SNF)  Consultants:  cardiology Procedures:  Antimicrobials:    Subjective: Pt still SOB and has worsening SOB when lying recumbent.  Objective: Vitals:   09/13/24 2024 09/14/24 0500 09/14/24 0532 09/14/24 0659  BP: 120/67  113/65   Pulse: 82  69   Resp: 18  20   Temp: 98.1 F (36.7 C)  97.8 F (36.6 C)   TempSrc: Oral  Oral   SpO2: 97%  91% 94%  Weight:  67.4 kg      Intake/Output Summary (Last 24 hours) at 09/14/2024  1145 Last data filed at 09/14/2024 1011 Gross per 24 hour  Intake 400 ml  Output 2650 ml  Net -2250 ml   Filed Weights   09/12/24 0439 09/13/24 0500 09/14/24 0500  Weight: 69.6 kg 68.8 kg 67.4 kg   Examination:  General exam: Appears calm and comfortable  Respiratory system: bibasilar crackles.  Respiratory effort normal. Cardiovascular system: normal S1 & S2 heard. No JVD, murmurs, rubs, gallops or clicks. No pedal edema. Gastrointestinal system: Abdomen is nondistended, soft and nontender. No organomegaly or masses felt. Normal bowel sounds heard. Central nervous system: Alert and oriented. No focal neurological deficits. Extremities: Symmetric 5 x 5 power. Skin: No rashes, lesions or ulcers. Psychiatry: Judgement and insight appear normal. Mood & affect appropriate.   Data Reviewed: I have personally reviewed following labs and imaging studies  CBC: Recent Labs  Lab 09/08/24 1909 09/09/24 0448 09/10/24 0426 09/11/24 0425  WBC 5.9 5.1 3.7* 4.0  NEUTROABS 4.3  --   --  2.8  HGB 8.9* 8.7* 8.2* 8.4*  HCT 29.3* 28.5* 26.9* 27.9*  MCV 90.2 91.1 90.9 91.8  PLT 211 181 156 163    Basic Metabolic Panel: Recent Labs  Lab 09/10/24 0426 09/11/24 0425 09/12/24 0449 09/13/24 0445 09/14/24 0428  NA 140 139 138 140 139  K 4.2 4.5 4.1 3.9 3.7  CL 101 99 97* 97* 95*  CO2 32 34* 35* 39* 38*  GLUCOSE 84 82 114* 94 78  BUN 21 21 22 21 23   CREATININE 1.51* 1.61* 1.64* 1.53* 1.56*  CALCIUM  8.5* 8.9 8.9 8.7* 8.9  MG  --   --   --   --  2.7*    CBG: No results for input(s): GLUCAP in the last 168 hours.  No results found for this or any previous visit (from the past 240 hours).   Radiology Studies: DG Chest Port 1 View Result Date: 09/12/2024 EXAM: 1 VIEW(S) XRAY OF THE CHEST 09/12/2024 12:20:00 PM COMPARISON: 09/08/2024 CLINICAL HISTORY: SOB (shortness of breath) FINDINGS: LINES, TUBES AND DEVICES: Aortic valve replacement is noted. LUNGS AND PLEURA: Diffuse  interstitial opacities favoring interstitial pulmonary edema, similar to prior. Similar right pleural effusion. No pneumothorax. HEART AND MEDIASTINUM: Stable cardiomegaly. Aortic atherosclerosis is present. BONES AND SOFT TISSUES: Prior median sternotomy is noted. The frontal projection is rotated to the right, reducing diagnostic sensitivity and specificity. IMPRESSION: 1. Diffuse interstitial opacities favoring interstitial pulmonary edema, similar to prior. 2. Similar right pleural effusion. 3. Cardiomegaly, stable. Electronically signed by: Ryan Salvage MD 09/12/2024 09:09 PM EST RP Workstation: [id number]    Scheduled Meds:  ALPRAZolam   0.5 mg Oral BID   budesonide -glycopyrrolate -formoterol   2 puff Inhalation BID   dapagliflozin  propanediol  10 mg Oral Daily   diltiazem   120 mg Oral Daily   furosemide   80 mg Intravenous BID   levothyroxine   50 mcg Oral Q0600   lumateperone  tosylate  42 mg Oral QHS   multivitamin with minerals  1 tablet Oral Daily   potassium chloride  SA  40 mEq Oral Daily   rOPINIRole   3 mg Oral  BID   rosuvastatin   20 mg Oral Daily   warfarin  1 mg Oral ONCE-1600   Warfarin - Pharmacist Dosing Inpatient   Does not apply q1600   Continuous Infusions:   LOS: 6 days   Time spent: 55 mins  Delynn Pursley Vicci, MD How to contact the [facility] Attending or Consulting provider 7A - 7P or covering provider during after hours 7P -7A, for this patient?  Check the care team in [facility] and look for a) attending/consulting TRH provider listed and b) the TRH team listed Log into [email or web address] to find provider on call.  Locate the TRH provider you are looking for under [facility] and page to a number that you can be directly reached. If you still have difficulty reaching the provider, please page the [facility] (Director on Call) for the Hospitalists listed on amion for assistance.  09/14/2024, 11:45 AM

## 2024-09-14 NOTE — Care Management Important Message (Signed)
 Important Message  Patient Details  Name: [name] MRN: [id number] Date of Birth: March 27, 1951   Important Message Given:  Yes - Medicare IM     Roselind Klus L Roel Douthat 09/14/2024, 1:54 PM

## 2024-09-15 DIAGNOSIS — N1832 Chronic kidney disease, stage 3b: Secondary | ICD-10-CM | POA: Diagnosis not present

## 2024-09-15 DIAGNOSIS — I5033 Acute on chronic diastolic (congestive) heart failure: Secondary | ICD-10-CM | POA: Diagnosis not present

## 2024-09-15 DIAGNOSIS — E876 Hypokalemia: Secondary | ICD-10-CM | POA: Diagnosis not present

## 2024-09-15 DIAGNOSIS — J9621 Acute and chronic respiratory failure with hypoxia: Secondary | ICD-10-CM | POA: Diagnosis not present

## 2024-09-15 LAB — BASIC METABOLIC PANEL WITH GFR
Anion gap: 7 (ref 5–15)
BUN: 24 mg/dL — ABNORMAL HIGH (ref 8–23)
CO2: 39 mmol/L — ABNORMAL HIGH (ref 22–32)
Calcium: 9 mg/dL (ref 8.9–10.3)
Chloride: 94 mmol/L — ABNORMAL LOW (ref 98–111)
Creatinine, Ser: 1.75 mg/dL — ABNORMAL HIGH (ref 0.44–1.00)
GFR, Estimated: 30 mL/min — ABNORMAL LOW (ref >60)
Glucose, Bld: 88 mg/dL (ref 70–99)
Potassium: 3.5 mmol/L (ref 3.5–5.1)
Sodium: 139 mmol/L (ref 135–145)

## 2024-09-15 LAB — PROTIME-INR
INR: 3 — ABNORMAL HIGH (ref 0.8–1.2)
Prothrombin Time: 32.8 s — ABNORMAL HIGH (ref 11.4–15.2)

## 2024-09-15 MED ORDER — FUROSEMIDE 10 MG/ML IJ SOLN
80.0000 mg | Freq: Every day | INTRAMUSCULAR | Status: DC
  Administered 2024-09-15 – 2024-09-17 (×3): 80 mg via INTRAVENOUS
  Filled 2024-09-15 (×2): qty 8

## 2024-09-15 MED ORDER — WARFARIN SODIUM 2 MG PO TABS
2.0000 mg | ORAL_TABLET | Freq: Once | ORAL | Status: AC
  Administered 2024-09-15: 2 mg via ORAL
  Filled 2024-09-15: qty 1

## 2024-09-15 NOTE — Plan of Care (Signed)
  Problem: Education: Goal: Knowledge of General Education information will improve Description: Including pain rating scale, medication(s)/side effects and non-pharmacologic comfort measures Outcome: Progressing   Problem: Health Behavior/Discharge Planning: Goal: Ability to manage health-related needs will improve Outcome: Progressing   Problem: Clinical Measurements: Goal: Ability to maintain clinical measurements within normal limits will improve Outcome: Progressing Goal: Respiratory complications will improve Outcome: Progressing   Problem: Activity: Goal: Risk for activity intolerance will decrease Outcome: Progressing   Problem: Pain Managment: Goal: General experience of comfort will improve and/or be controlled Outcome: Progressing   Problem: Skin Integrity: Goal: Risk for impaired skin integrity will decrease Outcome: Progressing   Problem: Education: Goal: Ability to demonstrate management of disease process will improve Outcome: Progressing Goal: Ability to verbalize understanding of medication therapies will improve Outcome: Progressing

## 2024-09-15 NOTE — Progress Notes (Signed)
 PROGRESS NOTE   [name]  FMW:[id number] DOB: Feb 18, 1951 DOA: 09/08/2024 PCP: Renato Dorothey HERO, NP   Chief Complaint  Patient presents with   Shortness of Breath   Level of care: Telemetry  Brief Admission History:  73 y.o. female with longstanding history of COPD and chronic respiratory failure typically on 4 L/min supplemental oxygen , chronic atrial fibrillation, HTN, HLD, CAD status post 2V CABG with mechanical aortic valve replacement, presented to ED with complaints of worsening shortness of breath over the last 3 days, also reports orthopnea.  Per patient she was compliant with her home diuretics and salt restrictions.  On presentation she was hypoxic at 85% and requiring up to 6 L of oxygen .  Chest x-ray with cardiomegaly, chronic right pleural effusion and pulmonary edema.  Significantly elevated proBNP at 5004, INR elevated at 5.6, potassium at 2.9.  Patient was admitted for acute on chronic hypoxic respiratory failure secondary to acute on chronic HFpEF.  Started on IV diuresis.  Cardiology was consulted.  Patient also has a possible LA mass which was noted on prior echoes.  Patient did not pursue TEE or further investigation.   Assessment and Plan:  Acute HFpEF 2D echo 04/28/2024 significant for preserved EF at 60% with indeterminate diastolic parameters, but significant hypertrophy and anterior ventricular septum flattening. proBNP significantly elevated at 5004, chest x-ray with pulmonary vascular congestion and pleural effusion. Cardiology was consulted and patient was started on IV Lasix   -Continue with IV Lasix  80 mg but reduced dose to once daily due to bump in BUN/Cr. Continue potassium supplement -Daily weight and BMP -Strict intake and output Filed Weights   09/13/24 0500 09/14/24 0500 09/15/24 0500  Weight: 68.8 kg 67.4 kg 64.8 kg    Intake/Output Summary (Last 24 hours) at 09/15/2024 1113 Last data filed at 09/15/2024 1041 Gross per 24 hour  Intake  920 ml  Output 3000 ml  Net -2080 ml   RLS (restless legs syndrome) - Continue Requip   Anxiety disorder - Continue home meds  Coronary artery disease due to lipid rich plaque S/p CABG in 2007.  No chest pain. - Continue with home statin - Not on any antiplatelet due to the need for anticoagulation with warfarin  Permanent atrial fibrillation  Heart rate currently controlled. - Continue with home Cardizem  - warfarin per pharmacy dosing with daily INR test ordered  Acute on chronic respiratory failure with hypoxia  Patient was using up to 6 L of oxygen  on admission, history of 4 L at baseline. - Continue with supplemental oxygen -wean to baseline as tolerated  Supratherapeutic INR INR initially at 6.0, down to 3.0.   No bleeding at this time. - Appreciate pharmacy help with monitoring - per pharm D give warfarin 2 mg today - daily PT/INR testing   Hypothyroidism - Continue home Synthroid   CKD (chronic kidney disease) stage 3, GFR 30-59 ml/min  - Patient with history of CKD stage IIIb, slight increase in creatinine but remained within her baseline. - Monitor renal function - Avoid nephrotoxins  Hypokalemia - repleted  - Continue daily potassium supplement  S/P AVR (aortic valve replacement) INR supratherapeutic at 6.0 with goal of 2.5-3.5 due to mechanical aortic valve. - warfarin dosing per pharm D  GERD - Continue with PPI  Generalized Weakness Adult Failure to thrive  -- pt now agreeable to SNF rehab -- [facility] consulted  DNR/DNI present on admission -- continue DNR order in hospital    DVT prophylaxis: warfarin Code Status: DNR  Family Communication:  Disposition: SNF  Consultants:  cardiology  Procedures:  Antimicrobials:    Subjective: Pt still SOB and has worsening SOB when lying recumbent.    Objective: Vitals:   09/15/24 0416 09/15/24 0500 09/15/24 0725 09/15/24 0937  BP: 109/70   106/62  Pulse: (!) 58     Resp: 18     Temp: 97.8 F (36.6  C)     TempSrc: Oral     SpO2: 97%  98%   Weight:  64.8 kg    Height:  5' 7.99 (1.727 m)      Intake/Output Summary (Last 24 hours) at 09/15/2024 1113 Last data filed at 09/15/2024 1041 Gross per 24 hour  Intake 920 ml  Output 3000 ml  Net -2080 ml   Filed Weights   09/13/24 0500 09/14/24 0500 09/15/24 0500  Weight: 68.8 kg 67.4 kg 64.8 kg   Examination:  General exam: Appears calm and comfortable  Respiratory system: bibasilar crackles.  Respiratory effort normal. Cardiovascular system: normal S1 & S2 heard. No JVD, murmurs, rubs, gallops or clicks. No pedal edema. Gastrointestinal system: Abdomen is nondistended, soft and nontender. No organomegaly or masses felt. Normal bowel sounds heard. Central nervous system: Alert and oriented. No focal neurological deficits. Extremities: Symmetric 5 x 5 power. Skin: No rashes, lesions or ulcers. Psychiatry: Judgement and insight appear normal. Mood & affect appropriate.   Data Reviewed: I have personally reviewed following labs and imaging studies  CBC: Recent Labs  Lab 09/08/24 1909 09/09/24 0448 09/10/24 0426 09/11/24 0425  WBC 5.9 5.1 3.7* 4.0  NEUTROABS 4.3  --   --  2.8  HGB 8.9* 8.7* 8.2* 8.4*  HCT 29.3* 28.5* 26.9* 27.9*  MCV 90.2 91.1 90.9 91.8  PLT 211 181 156 163    Basic Metabolic Panel: Recent Labs  Lab 09/11/24 0425 09/12/24 0449 09/13/24 0445 09/14/24 0428 09/15/24 0414  NA 139 138 140 139 139  K 4.5 4.1 3.9 3.7 3.5  CL 99 97* 97* 95* 94*  CO2 34* 35* 39* 38* 39*  GLUCOSE 82 114* 94 78 88  BUN 21 22 21 23  24*  CREATININE 1.61* 1.64* 1.53* 1.56* 1.75*  CALCIUM  8.9 8.9 8.7* 8.9 9.0  MG  --   --   --  2.7*  --     CBG: No results for input(s): GLUCAP in the last 168 hours.  No results found for this or any previous visit (from the past 240 hours).   Radiology Studies: No results found.   Scheduled Meds:  ALPRAZolam   0.5 mg Oral BID   budesonide -glycopyrrolate -formoterol   2 puff  Inhalation BID   dapagliflozin  propanediol  10 mg Oral Daily   diltiazem   120 mg Oral Daily   furosemide   80 mg Intravenous Daily   levothyroxine   50 mcg Oral Q0600   lumateperone  tosylate  42 mg Oral QHS   multivitamin with minerals  1 tablet Oral Daily   potassium chloride  SA  40 mEq Oral Daily   rOPINIRole   3 mg Oral BID   rosuvastatin   20 mg Oral Daily   warfarin  2 mg Oral ONCE-1600   Warfarin - Pharmacist Dosing Inpatient   Does not apply q1600   Continuous Infusions:   LOS: 7 days   Time spent: 55 mins  Halie Gass Vicci, MD How to contact the [facility] Attending or Consulting provider 7A - 7P or covering provider during after hours 7P -7A, for this patient?  Check the care team in [facility] and look for a)  attending/consulting TRH provider listed and b) the TRH team listed Log into [email or web address] to find provider on call.  Locate the TRH provider you are looking for under [facility] and page to a number that you can be directly reached. If you still have difficulty reaching the provider, please page the [facility] (Director on Call) for the Hospitalists listed on amion for assistance.  09/15/2024, 11:13 AM

## 2024-09-15 NOTE — Consult Note (Addendum)
 PHARMACY - ANTICOAGULATION CONSULT NOTE  Pharmacy Consult for Warfarin Indication: mAVR & Afib  Allergies  Allergen Reactions   Penicillin G Itching   Penicillins Hives, Itching, Swelling and Rash   Dust Mite Extract Other (See Comments)    Seasonal allergies    Patient Measurements: Height: 5' 7.99 (172.7 cm) Weight: 64.8 kg (142 lb 13.7 oz) IBW/kg (Calculated) : 63.88 HEPARIN  DW (KG): 64.8  Vital Signs: Temp: 97.8 F (36.6 C) (11/27 0416) Temp Source: Oral (11/27 0416) BP: 109/70 (11/27 0416) Pulse Rate: 58 (11/27 0416)  Labs: Recent Labs    09/13/24 0445 09/14/24 0428 09/15/24 0414  LABPROT 33.7* 35.6* 32.8*  INR 3.1* 3.4* 3.0*  CREATININE 1.53* 1.56* 1.75*    Estimated Creatinine Clearance: 29.3 mL/min (A) (by C-G formula based on SCr of 1.75 mg/dL (H)).   Medical History: Past Medical History:  Diagnosis Date   Anxiety disorder    Aortic stenosis    Status post [employer] mechanical AVR 2007   Asthma    Atrial fibrillation (HCC)    Carcinoid tumor of colon (HCC) 2007   Chronic diastolic heart failure (HCC)    Coronary atherosclerosis of native coronary artery    Status post CABG 2007   GERD (gastroesophageal reflux disease)    History of colonoscopy 2003   Dr. 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 Shaaron, benign biopsies   RLS (restless legs syndrome)    Schatzki's ring     Medications:  Warfarin 3 mg daily except for 4 mg on Tuesday (TWD: 22 mg)  Last patient reported dose was 11/20 at noon  Assessment: 73 y/o F with medical history as above presenting with shortness of breath and CXR impression of pulmonary edema. Pharmacy consulted to dose warfarin while patient is admitted.  INR today is 5.6 > 6 > 5.2 > 4.1 > 3.2 > 3.1> 3.4>3.0.  Within range, no bleeding noted. Conservative redosing with elevated INR on admission Hgb 8.2 > 8.4, stable  Goal of  Therapy:  INR 2.0-3.0 with mAVR and Afib Monitor platelets by anticoagulation protocol: Yes   Plan:  Give 2 mg warfarin today PT INR daily Monitor for S/S of bleeding  Elspeth Sour, PharmD Clinical Pharmacist 09/15/2024 8:48 AM

## 2024-09-15 NOTE — Plan of Care (Signed)
  Problem: Education: Goal: Knowledge of General Education information will improve Description: Including pain rating scale, medication(s)/side effects and non-pharmacologic comfort measures Outcome: Progressing   Problem: Clinical Measurements: Goal: Ability to maintain clinical measurements within normal limits will improve Outcome: Progressing   Problem: Pain Managment: Goal: General experience of comfort will improve and/or be controlled Outcome: Progressing   Problem: Skin Integrity: Goal: Risk for impaired skin integrity will decrease Outcome: Progressing

## 2024-09-15 NOTE — Progress Notes (Signed)
 Patient reported complaints of pain to right upper ribs to back. Patient requested Tylenol  for pain did not want pain pill, patient had received Tylenol  at 0443. Patient informed about using hot packs for discomfort and pain. MD Vicci made aware. New order placed for hot packs. Patient reported decreased pain after using hot packs.

## 2024-09-16 DIAGNOSIS — I4821 Permanent atrial fibrillation: Secondary | ICD-10-CM | POA: Diagnosis not present

## 2024-09-16 DIAGNOSIS — Z951 Presence of aortocoronary bypass graft: Secondary | ICD-10-CM | POA: Diagnosis not present

## 2024-09-16 DIAGNOSIS — I5033 Acute on chronic diastolic (congestive) heart failure: Secondary | ICD-10-CM | POA: Diagnosis not present

## 2024-09-16 DIAGNOSIS — E876 Hypokalemia: Secondary | ICD-10-CM | POA: Diagnosis not present

## 2024-09-16 LAB — CBC
HCT: 29.9 % — ABNORMAL LOW (ref 36.0–46.0)
Hemoglobin: 9.2 g/dL — ABNORMAL LOW (ref 12.0–15.0)
MCH: 27.2 pg (ref 26.0–34.0)
MCHC: 30.8 g/dL (ref 30.0–36.0)
MCV: 88.5 fL (ref 80.0–100.0)
Platelets: 193 K/uL (ref 150–400)
RBC: 3.38 MIL/uL — ABNORMAL LOW (ref 3.87–5.11)
RDW: 14.3 % (ref 11.5–15.5)
WBC: 5.2 K/uL (ref 4.0–10.5)
nRBC: 0 % (ref 0.0–0.2)

## 2024-09-16 LAB — BASIC METABOLIC PANEL WITH GFR
Anion gap: 7 (ref 5–15)
BUN: 27 mg/dL — ABNORMAL HIGH (ref 8–23)
CO2: 36 mmol/L — ABNORMAL HIGH (ref 22–32)
Calcium: 9.2 mg/dL (ref 8.9–10.3)
Chloride: 95 mmol/L — ABNORMAL LOW (ref 98–111)
Creatinine, Ser: 1.61 mg/dL — ABNORMAL HIGH (ref 0.44–1.00)
GFR, Estimated: 34 mL/min — ABNORMAL LOW (ref >60)
Glucose, Bld: 77 mg/dL (ref 70–99)
Potassium: 3.6 mmol/L (ref 3.5–5.1)
Sodium: 138 mmol/L (ref 135–145)

## 2024-09-16 LAB — PROTIME-INR
INR: 2.6 — ABNORMAL HIGH (ref 0.8–1.2)
Prothrombin Time: 29.2 s — ABNORMAL HIGH (ref 11.4–15.2)

## 2024-09-16 MED ORDER — WARFARIN SODIUM 2 MG PO TABS
3.0000 mg | ORAL_TABLET | Freq: Once | ORAL | Status: AC
  Administered 2024-09-16: 3 mg via ORAL
  Filled 2024-09-16: qty 1

## 2024-09-16 NOTE — Plan of Care (Signed)
 ?  Problem: Clinical Measurements: ?Goal: Respiratory complications will improve ?Outcome: Progressing ?Goal: Cardiovascular complication will be avoided ?Outcome: Progressing ?  ?Problem: Safety: ?Goal: Ability to remain free from injury will improve ?Outcome: Progressing ?  ?

## 2024-09-16 NOTE — Progress Notes (Signed)
 PROGRESS NOTE   DIM MEISINGER  FMW:[id number] DOB: [birth year] DOA: 09/08/2024 PCP: Renato Dorothey HERO, NP   Chief Complaint  Patient presents with   Shortness of Breath   Level of care: Telemetry  Brief Admission History:  73 y.o. female with longstanding history of COPD and chronic respiratory failure typically on 4 L/min supplemental oxygen , chronic atrial fibrillation, HTN, HLD, CAD status post 2V CABG with mechanical aortic valve replacement, presented to ED with complaints of worsening shortness of breath over the last 3 days, also reports orthopnea.  Per patient she was compliant with her home diuretics and salt restrictions.  On presentation she was hypoxic at 85% and requiring up to 6 L of oxygen .  Chest x-ray with cardiomegaly, chronic right pleural effusion and pulmonary edema.  Significantly elevated proBNP at 5004, INR elevated at 5.6, potassium at 2.9.  Patient was admitted for acute on chronic hypoxic respiratory failure secondary to acute on chronic HFpEF.  Started on IV diuresis.  Cardiology was consulted.  Patient also has a possible LA mass which was noted on prior echoes.  Patient did not pursue TEE or further investigation.   Assessment and Plan:  Acute HFpEF 2D echo 04/28/2024 significant for preserved EF at 60% with indeterminate diastolic parameters, but significant hypertrophy and anterior ventricular septum flattening. proBNP significantly elevated at 5004, chest x-ray with pulmonary vascular congestion and pleural effusion. Cardiology was consulted and patient was started on IV Lasix   -Continue with IV Lasix  80 mg but reduced dose to once daily due to bump in BUN/Cr. Continue potassium supplement -Daily weight and BMP -Strict intake and output -she is getting close to goal weight per cardio team around high 130s#  Filed Weights   09/14/24 0500 09/15/24 0500 09/16/24 0433  Weight: 67.4 kg 64.8 kg 64 kg    Intake/Output Summary (Last 24 hours) at 09/16/2024  1237 Last data filed at 09/16/2024 9077 Gross per 24 hour  Intake 120 ml  Output 1950 ml  Net -1830 ml   RLS (restless legs syndrome) - Continue Requip   Anxiety disorder - Continue home meds  Coronary artery disease due to lipid rich plaque S/p CABG in 2007.  No chest pain. - Continue with home statin - Not on any antiplatelet due to the need for anticoagulation with warfarin  Permanent atrial fibrillation  Heart rate currently controlled. - Continue with home Cardizem  - warfarin per pharmacy dosing with daily INR test ordered  Acute on chronic respiratory failure with hypoxia  Patient was using up to 6 L of oxygen  on admission, history of 4 L at baseline. - Continue with supplemental oxygen -wean to baseline as tolerated  Supratherapeutic INR INR initially at 6.0, down to 3.0.   No bleeding at this time. - Appreciate pharmacy help with monitoring - per pharm D give warfarin 3 mg today - daily PT/INR testing   Hypothyroidism - Continue home Synthroid   CKD (chronic kidney disease) stage 3, GFR 30-59 ml/min  - Patient with history of CKD stage IIIb, slight increase in creatinine but remained within her baseline. - Monitor renal function - Avoid nephrotoxins  Hypokalemia - repleted  - Continue daily potassium supplement  S/P AVR (aortic valve replacement) INR supratherapeutic at 6.0 with goal of 2.5-3.5 due to mechanical aortic valve. - warfarin dosing per pharm D  GERD - Continue with PPI  Generalized Weakness Adult Failure to thrive  -- pt now agreeable to SNF rehab -- [facility] consulted  DNR/DNI present on admission -- continue DNR order  in hospital    DVT prophylaxis: warfarin Code Status: DNR  Family Communication:  Disposition: SNF  Consultants:  cardiology  Procedures:  Antimicrobials:    Subjective: Pt says she is noticing less edema in her legs    Objective: Vitals:   09/15/24 1356 09/15/24 1934 09/16/24 0433 09/16/24 0808  BP: 111/64  97/63 110/66   Pulse: 72 76    Resp: 18 18 18    Temp: 98.1 F (36.7 C) 98.4 F (36.9 C) (!) 97.3 F (36.3 C)   TempSrc: Oral Oral Oral   SpO2: 95% 96% 98% 98%  Weight:   64 kg   Height:        Intake/Output Summary (Last 24 hours) at 09/16/2024 1237 Last data filed at 09/16/2024 9077 Gross per 24 hour  Intake 120 ml  Output 1950 ml  Net -1830 ml   Filed Weights   09/14/24 0500 09/15/24 0500 09/16/24 0433  Weight: 67.4 kg 64.8 kg 64 kg   Examination:  General exam: Appears calm and comfortable  Respiratory system: bibasilar crackles.  Respiratory effort normal. Cardiovascular system: normal S1 & S2 heard. No JVD, murmurs, rubs, gallops or clicks. No pedal edema. Gastrointestinal system: Abdomen is nondistended, soft and nontender. No organomegaly or masses felt. Normal bowel sounds heard. Central nervous system: Alert and oriented. No focal neurological deficits. Extremities: 1+ pretibial edema BLEs.  Symmetric 5 x 5 power. Skin: No rashes, lesions or ulcers. Psychiatry: Judgement and insight appear normal. Mood & affect appropriate.   Data Reviewed: I have personally reviewed following labs and imaging studies  CBC: Recent Labs  Lab 09/10/24 0426 09/11/24 0425 09/16/24 0433  WBC 3.7* 4.0 5.2  NEUTROABS  --  2.8  --   HGB 8.2* 8.4* 9.2*  HCT 26.9* 27.9* 29.9*  MCV 90.9 91.8 88.5  PLT 156 163 193    Basic Metabolic Panel: Recent Labs  Lab 09/12/24 0449 09/13/24 0445 09/14/24 0428 09/15/24 0414 09/16/24 0433  NA 138 140 139 139 138  K 4.1 3.9 3.7 3.5 3.6  CL 97* 97* 95* 94* 95*  CO2 35* 39* 38* 39* 36*  GLUCOSE 114* 94 78 88 77  BUN 22 21 23  24* 27*  CREATININE 1.64* 1.53* 1.56* 1.75* 1.61*  CALCIUM  8.9 8.7* 8.9 9.0 9.2  MG  --   --  2.7*  --   --    CBG: No results for input(s): GLUCAP in the last 168 hours.  No results found for this or any previous visit (from the past 240 hours).   Radiology Studies: No results found.  Scheduled Meds:   ALPRAZolam   0.5 mg Oral BID   budesonide -glycopyrrolate -formoterol   2 puff Inhalation BID   dapagliflozin  propanediol  10 mg Oral Daily   diltiazem   120 mg Oral Daily   furosemide   80 mg Intravenous Daily   levothyroxine   50 mcg Oral Q0600   lumateperone  tosylate  42 mg Oral QHS   multivitamin with minerals  1 tablet Oral Daily   potassium chloride  SA  40 mEq Oral Daily   rOPINIRole   3 mg Oral BID   rosuvastatin   20 mg Oral Daily   warfarin  3 mg Oral ONCE-1600   Warfarin - Pharmacist Dosing Inpatient   Does not apply q1600   Continuous Infusions:   LOS: 8 days   Time spent: 55 mins  Caryssa Elzey Vicci, MD How to contact the [facility] Attending or Consulting provider 7A - 7P or covering provider during after hours 7P -7A, for  this patient?  Check the care team in [facility] and look for a) attending/consulting TRH provider listed and b) the TRH team listed Log into [email or web address] to find provider on call.  Locate the TRH provider you are looking for under [facility] and page to a number that you can be directly reached. If you still have difficulty reaching the provider, please page the [facility] (Director on Call) for the Hospitalists listed on amion for assistance.  09/16/2024, 12:37 PM

## 2024-09-16 NOTE — Progress Notes (Signed)
 Mobility Specialist Progress Note:    09/16/24 0910  Mobility  Activity Pivoted/transferred to/from [facility]  Level of Assistance Minimal assist, patient does 75% or more  Assistive Device None  Distance Ambulated (ft) 3 ft  Range of Motion/Exercises Active;All extremities  Activity Response Tolerated well  Mobility Referral Yes  Mobility visit 1 Mobility  Mobility Specialist Start Time (ACUTE ONLY) 0910  Mobility Specialist Stop Time (ACUTE ONLY) 0930  Mobility Specialist Time Calculation (min) (ACUTE ONLY) 20 min   Pt received in bed, NT in room. Required MinA to stand and transfer with no AD. Tolerated well, asx throughout. Left on BSC with NT, all needs met.  Cyntha Brickman Mobility Specialist Please contact via [employer] Educational Needs [employer] or  Rehab office at [phone]

## 2024-09-16 NOTE — Consult Note (Signed)
 PHARMACY - ANTICOAGULATION CONSULT NOTE  Pharmacy Consult for Warfarin Indication: mAVR & Afib  Allergies  Allergen Reactions   Penicillin G Itching   Penicillins Hives, Itching, Swelling and Rash   Dust Mite Extract Other (See Comments)    Seasonal allergies    Patient Measurements: Height: 5' 7.99 (172.7 cm) Weight: 64 kg (141 lb 1.5 oz) IBW/kg (Calculated) : 63.88 HEPARIN  DW (KG): 64.8  Vital Signs: Temp: 97.3 F (36.3 C) (11/28 0433) Temp Source: Oral (11/28 0433) BP: 110/66 (11/28 0433)  Labs: Recent Labs    09/14/24 0428 09/15/24 0414 09/16/24 0433  HGB  --   --  9.2*  HCT  --   --  29.9*  PLT  --   --  193  LABPROT 35.6* 32.8* 29.2*  INR 3.4* 3.0* 2.6*  CREATININE 1.56* 1.75* 1.61*    Estimated Creatinine Clearance: 31.9 mL/min (A) (by C-G formula based on SCr of 1.61 mg/dL (H)).   Medical History: Past Medical History:  Diagnosis Date   Anxiety disorder    Aortic stenosis    Status post St. Jude mechanical AVR 2007   Asthma    Atrial fibrillation (HCC)    Carcinoid tumor of colon (HCC) 2007   Chronic diastolic heart failure (HCC)    Coronary atherosclerosis of native coronary artery    Status post CABG 2007   GERD (gastroesophageal reflux disease)    History of colonoscopy 2003   Dr. 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 Shaaron, benign biopsies   RLS (restless legs syndrome)    Schatzki's ring     Medications:  Warfarin 3 mg daily except for 4 mg on Tuesday (TWD: 22 mg)  Last patient reported dose was 11/20 at noon  Assessment: 73 y/o F with medical history as above presenting with shortness of breath and CXR impression of pulmonary edema. Pharmacy consulted to dose warfarin while patient is admitted.  INR today is 5.6 > 6 > 5.2 > 4.1 > 3.2 > 3.1> 3.4>3.0>2.6.  Within range, no bleeding noted. Conservative redosing with elevated INR on admission Hgb  8.2 > 8.4, stable  Goal of Therapy:  INR 2.5 to 3.5 with mAVR and Afib Monitor platelets by anticoagulation protocol: Yes   Plan:  Give 3 mg warfarin today PT INR daily Monitor for S/S of bleeding  Elspeth Sour, PharmD Clinical Pharmacist 09/16/2024 7:58 AM

## 2024-09-17 DIAGNOSIS — N1832 Chronic kidney disease, stage 3b: Secondary | ICD-10-CM | POA: Diagnosis not present

## 2024-09-17 DIAGNOSIS — J9621 Acute and chronic respiratory failure with hypoxia: Secondary | ICD-10-CM | POA: Diagnosis not present

## 2024-09-17 DIAGNOSIS — I5033 Acute on chronic diastolic (congestive) heart failure: Secondary | ICD-10-CM | POA: Diagnosis not present

## 2024-09-17 DIAGNOSIS — E876 Hypokalemia: Secondary | ICD-10-CM | POA: Diagnosis not present

## 2024-09-17 LAB — BASIC METABOLIC PANEL WITH GFR
Anion gap: 8 (ref 5–15)
BUN: 28 mg/dL — ABNORMAL HIGH (ref 8–23)
CO2: 35 mmol/L — ABNORMAL HIGH (ref 22–32)
Calcium: 9.2 mg/dL (ref 8.9–10.3)
Chloride: 94 mmol/L — ABNORMAL LOW (ref 98–111)
Creatinine, Ser: 1.55 mg/dL — ABNORMAL HIGH (ref 0.44–1.00)
GFR, Estimated: 35 mL/min — ABNORMAL LOW (ref >60)
Glucose, Bld: 77 mg/dL (ref 70–99)
Potassium: 3.7 mmol/L (ref 3.5–5.1)
Sodium: 137 mmol/L (ref 135–145)

## 2024-09-17 LAB — PROTIME-INR
INR: 2.2 — ABNORMAL HIGH (ref 0.8–1.2)
Prothrombin Time: 25.9 s — ABNORMAL HIGH (ref 11.4–15.2)

## 2024-09-17 MED ORDER — WARFARIN SODIUM 2 MG PO TABS
3.0000 mg | ORAL_TABLET | Freq: Once | ORAL | Status: AC
  Administered 2024-09-17: 3 mg via ORAL
  Filled 2024-09-17: qty 1

## 2024-09-17 NOTE — Progress Notes (Signed)
 PROGRESS NOTE   [name]  FMW:[id number] DOB: November 15, 1950 DOA: 09/08/2024 PCP: Renato Dorothey HERO, NP   Chief Complaint  Patient presents with   Shortness of Breath   Level of care: Telemetry  Brief Admission History:  73 y.o. female with longstanding history of COPD and chronic respiratory failure typically on 4 L/min supplemental oxygen , chronic atrial fibrillation, HTN, HLD, CAD status post 2V CABG with mechanical aortic valve replacement, presented to ED with complaints of worsening shortness of breath over the last 3 days, also reports orthopnea.  Per patient she was compliant with her home diuretics and salt restrictions.  On presentation she was hypoxic at 85% and requiring up to 6 L of oxygen .  Chest x-ray with cardiomegaly, chronic right pleural effusion and pulmonary edema.  Significantly elevated proBNP at 5004, INR elevated at 5.6, potassium at 2.9.  Patient was admitted for acute on chronic hypoxic respiratory failure secondary to acute on chronic HFpEF.  Started on IV diuresis.  Cardiology was consulted.  Patient also has a possible LA mass which was noted on prior echoes.  Patient did not pursue TEE or further investigation.   Assessment and Plan:  Acute HFpEF 2D echo 04/28/2024 significant for preserved EF at 60% with indeterminate diastolic parameters, but significant hypertrophy and anterior ventricular septum flattening. proBNP significantly elevated at 5004, chest x-ray with pulmonary vascular congestion and pleural effusion. Cardiology was consulted and patient was started on IV Lasix   -Continue with IV Lasix  80 mg but reduced dose to once daily due to bump in BUN/Cr. Continue potassium supplement, renal function improved after reduction in furosemide  dose, continue current dose for now -Daily weight and BMP -Strict intake and output -she is getting close to goal weight per cardio team around high 130s#  Filed Weights   09/15/24 0500 09/16/24 0433 09/17/24  0520  Weight: 64.8 kg 64 kg 65.8 kg    Intake/Output Summary (Last 24 hours) at 09/17/2024 1201 Last data filed at 09/17/2024 9083 Gross per 24 hour  Intake 720 ml  Output 1650 ml  Net -930 ml   RLS (restless legs syndrome) - Continue Requip   Anxiety disorder - Continue home meds  Coronary artery disease due to lipid rich plaque S/p CABG in 2007.  No chest pain. - Continue with home statin - Not on any antiplatelet due to the need for anticoagulation with warfarin  Permanent atrial fibrillation  Heart rate currently controlled. - Continue with home Cardizem  - warfarin per pharmacy dosing with daily INR test ordered  Acute on chronic respiratory failure with hypoxia  Patient was using up to 6 L of oxygen  on admission, history of 4 L at baseline. - Continue with supplemental oxygen -wean to baseline as tolerated  Supratherapeutic INR INR initially at 6.0, down to 3.0.   No bleeding at this time. - Appreciate pharmacy help with monitoring - per pharm D give warfarin 3 mg today - daily PT/INR testing   Hypothyroidism - Continue home Synthroid   CKD (chronic kidney disease) stage 3, GFR 30-59 ml/min  - Patient with history of CKD stage IIIb, slight increase in creatinine but remained within her baseline. - Monitor renal function - Avoid nephrotoxins  Hypokalemia - repleted  - Continue daily potassium supplement  S/P AVR (aortic valve replacement) INR supratherapeutic at 6.0 with goal of 2.5-3.5 due to mechanical aortic valve. - warfarin dosing per pharm D  GERD - Continue with PPI  Generalized Weakness Adult Failure to thrive  -- pt now agreeable to SNF  rehab -- [facility] consulted and planning for SNF on 12/1   DNR/DNI present on admission -- continue DNR order in hospital   DVT prophylaxis: warfarin Code Status: DNR  Family Communication:  Disposition: SNF  Consultants:  cardiology  Procedures:  Antimicrobials:    Subjective: No specific complaints  today.     Objective: Vitals:   09/16/24 1936 09/16/24 1950 09/17/24 0520 09/17/24 0823  BP:  96/62 109/69   Pulse:  79 74   Resp:  18 20   Temp:  98.3 F (36.8 C) 97.9 F (36.6 C)   TempSrc:  Oral Oral   SpO2: 97% 98% 94% 95%  Weight:   65.8 kg   Height:        Intake/Output Summary (Last 24 hours) at 09/17/2024 1201 Last data filed at 09/17/2024 0916 Gross per 24 hour  Intake 720 ml  Output 1650 ml  Net -930 ml   Filed Weights   09/15/24 0500 09/16/24 0433 09/17/24 0520  Weight: 64.8 kg 64 kg 65.8 kg   Examination:  General exam: Appears calm and comfortable  Respiratory system: no crackles heard today.  Respiratory effort normal. Cardiovascular system: normal S1 & S2 heard. No JVD, murmurs, rubs, gallops or clicks. No pedal edema. Gastrointestinal system: Abdomen is nondistended, soft and nontender. No organomegaly or masses felt. Normal bowel sounds heard. Central nervous system: Alert and oriented. No focal neurological deficits. Extremities: 1+ pretibial edema BLEs.  Symmetric 5 x 5 power. Skin: No rashes, lesions or ulcers. Psychiatry: Judgement and insight appear normal. Mood & affect appropriate.   Data Reviewed: I have personally reviewed following labs and imaging studies  CBC: Recent Labs  Lab 09/11/24 0425 09/16/24 0433  WBC 4.0 5.2  NEUTROABS 2.8  --   HGB 8.4* 9.2*  HCT 27.9* 29.9*  MCV 91.8 88.5  PLT 163 193    Basic Metabolic Panel: Recent Labs  Lab 09/13/24 0445 09/14/24 0428 09/15/24 0414 09/16/24 0433 09/17/24 0421  NA 140 139 139 138 137  K 3.9 3.7 3.5 3.6 3.7  CL 97* 95* 94* 95* 94*  CO2 39* 38* 39* 36* 35*  GLUCOSE 94 78 88 77 77  BUN 21 23 24* 27* 28*  CREATININE 1.53* 1.56* 1.75* 1.61* 1.55*  CALCIUM  8.7* 8.9 9.0 9.2 9.2  MG  --  2.7*  --   --   --    CBG: No results for input(s): GLUCAP in the last 168 hours.  No results found for this or any previous visit (from the past 240 hours).   Radiology Studies: No  results found.  Scheduled Meds:  ALPRAZolam   0.5 mg Oral BID   budesonide -glycopyrrolate -formoterol   2 puff Inhalation BID   dapagliflozin  propanediol  10 mg Oral Daily   diltiazem   120 mg Oral Daily   furosemide   80 mg Intravenous Daily   levothyroxine   50 mcg Oral Q0600   lumateperone  tosylate  42 mg Oral QHS   multivitamin with minerals  1 tablet Oral Daily   potassium chloride  SA  40 mEq Oral Daily   rOPINIRole   3 mg Oral BID   rosuvastatin   20 mg Oral Daily   warfarin  3 mg Oral ONCE-1600   Warfarin - Pharmacist Dosing Inpatient   Does not apply q1600   Continuous Infusions:   LOS: 9 days   Time spent: 55 mins  Desiray Orchard Vicci, MD How to contact the [facility] Attending or Consulting provider 7A - 7P or covering provider during after hours 7P -  7A, for this patient?  Check the care team in [facility] and look for a) attending/consulting TRH provider listed and b) the TRH team listed Log into [email or web address] to find provider on call.  Locate the TRH provider you are looking for under [facility] and page to a number that you can be directly reached. If you still have difficulty reaching the provider, please page the [facility] (Director on Call) for the Hospitalists listed on amion for assistance.  09/17/2024, 12:01 PM

## 2024-09-17 NOTE — Consult Note (Signed)
 PHARMACY - ANTICOAGULATION CONSULT NOTE  Pharmacy Consult for Warfarin Indication: mAVR & Afib  Allergies  Allergen Reactions   Penicillin G Itching   Penicillins Hives, Itching, Swelling and Rash   Dust Mite Extract Other (See Comments)    Seasonal allergies    Patient Measurements: Height: 5' 7.99 (172.7 cm) Weight: 65.8 kg (145 lb 1 oz) IBW/kg (Calculated) : 63.88 HEPARIN  DW (KG): 64.8  Vital Signs: Temp: 97.9 F (36.6 C) (11/29 0520) Temp Source: Oral (11/29 0520) BP: 109/69 (11/29 0520) Pulse Rate: 74 (11/29 0520)  Labs: Recent Labs    09/15/24 0414 09/16/24 0433 09/17/24 0421  HGB  --  9.2*  --   HCT  --  29.9*  --   PLT  --  193  --   LABPROT 32.8* 29.2* 25.9*  INR 3.0* 2.6* 2.2*  CREATININE 1.75* 1.61* 1.55*    Estimated Creatinine Clearance: 33.1 mL/min (A) (by C-G formula based on SCr of 1.55 mg/dL (H)).   Medical History: Past Medical History:  Diagnosis Date   Anxiety disorder    Aortic stenosis    Status post St. Jude mechanical AVR 2007   Asthma    Atrial fibrillation (HCC)    Carcinoid tumor of colon (HCC) 2007   Chronic diastolic heart failure (HCC)    Coronary atherosclerosis of native coronary artery    Status post CABG 2007   GERD (gastroesophageal reflux disease)    History of colonoscopy 2003   Dr. 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 Shaaron, benign biopsies   RLS (restless legs syndrome)    Schatzki's ring     Medications:  Warfarin 3 mg daily except for 4 mg on Tuesday (TWD: 22 mg)  Last patient reported dose was 11/20 at noon  Assessment: 73 y/o F with medical history as above presenting with shortness of breath and CXR impression of pulmonary edema. Pharmacy consulted to dose warfarin while patient is admitted. Per anti-coag clinic notes history of atrial valve replacement/afib  INR today is 5.6 > 6 > 5.2 > 4.1 > 3.2 > 3.1>  3.4>3.0>2.6>2.2.  Within range, no bleeding noted. Conservative redosing with elevated INR on admission Hgb 8.2 > 8.4, stable  Goal of Therapy:  INR 2.0-3.0 per anti-coag visits Monitor platelets by anticoagulation protocol: Yes   Plan:  Give 3 mg warfarin today PT INR daily Monitor for S/S of bleeding  Elspeth Sour, PharmD Clinical Pharmacist 09/17/2024 8:01 AM

## 2024-09-17 NOTE — Plan of Care (Signed)

## 2024-09-18 DIAGNOSIS — J9621 Acute and chronic respiratory failure with hypoxia: Secondary | ICD-10-CM | POA: Diagnosis not present

## 2024-09-18 DIAGNOSIS — N1832 Chronic kidney disease, stage 3b: Secondary | ICD-10-CM | POA: Diagnosis not present

## 2024-09-18 DIAGNOSIS — I5033 Acute on chronic diastolic (congestive) heart failure: Secondary | ICD-10-CM | POA: Diagnosis not present

## 2024-09-18 DIAGNOSIS — E876 Hypokalemia: Secondary | ICD-10-CM | POA: Diagnosis not present

## 2024-09-18 LAB — BASIC METABOLIC PANEL WITH GFR
Anion gap: 7 (ref 5–15)
BUN: 27 mg/dL — ABNORMAL HIGH (ref 8–23)
CO2: 35 mmol/L — ABNORMAL HIGH (ref 22–32)
Calcium: 9.2 mg/dL (ref 8.9–10.3)
Chloride: 93 mmol/L — ABNORMAL LOW (ref 98–111)
Creatinine, Ser: 1.46 mg/dL — ABNORMAL HIGH (ref 0.44–1.00)
GFR, Estimated: 38 mL/min — ABNORMAL LOW (ref >60)
Glucose, Bld: 77 mg/dL (ref 70–99)
Potassium: 3.7 mmol/L (ref 3.5–5.1)
Sodium: 136 mmol/L (ref 135–145)

## 2024-09-18 LAB — PROTIME-INR
INR: 2.6 — ABNORMAL HIGH (ref 0.8–1.2)
Prothrombin Time: 28.7 s — ABNORMAL HIGH (ref 11.4–15.2)

## 2024-09-18 MED ORDER — TORSEMIDE 20 MG PO TABS
40.0000 mg | ORAL_TABLET | Freq: Every day | ORAL | Status: DC
  Administered 2024-09-18 – 2024-09-20 (×3): 40 mg via ORAL
  Filled 2024-09-18 (×3): qty 2

## 2024-09-18 MED ORDER — WARFARIN SODIUM 2 MG PO TABS
2.0000 mg | ORAL_TABLET | Freq: Once | ORAL | Status: AC
  Administered 2024-09-18: 2 mg via ORAL
  Filled 2024-09-18: qty 1

## 2024-09-18 NOTE — Plan of Care (Signed)

## 2024-09-18 NOTE — Progress Notes (Signed)
 PROGRESS NOTE   [name]  FMW:[id number] DOB: 1951-04-21 DOA: 09/08/2024 PCP: Renato Dorothey HERO, NP   Chief Complaint  Patient presents with   Shortness of Breath   Level of care: Telemetry  Brief Admission History:  73 y.o. female with longstanding history of COPD and chronic respiratory failure typically on 4 L/min supplemental oxygen , chronic atrial fibrillation, HTN, HLD, CAD status post 2V CABG with mechanical aortic valve replacement, presented to ED with complaints of worsening shortness of breath over the last 3 days, also reports orthopnea.  Per patient she was compliant with her home diuretics and salt restrictions.  On presentation she was hypoxic at 85% and requiring up to 6 L of oxygen .  Chest x-ray with cardiomegaly, chronic right pleural effusion and pulmonary edema.  Significantly elevated proBNP at 5004, INR elevated at 5.6, potassium at 2.9.  Patient was admitted for acute on chronic hypoxic respiratory failure secondary to acute on chronic HFpEF.  Started on IV diuresis.  Cardiology was consulted.  Patient also has a possible LA mass which was noted on prior echoes.  Patient did not pursue TEE or further investigation.   Assessment and Plan:  Acute HFpEF 2D echo 04/28/2024 significant for preserved EF at 60% with indeterminate diastolic parameters, but significant hypertrophy and anterior ventricular septum flattening. proBNP significantly elevated at 5004, chest x-ray with pulmonary vascular congestion and pleural effusion. Cardiology was consulted and patient was started on IV Lasix   -treated with IV Lasix  80 mg but reduced dose to once daily due to bump in BUN/Cr. Continue potassium supplement, renal function improved after reduction in furosemide  dose, continue current dose for now -Daily weight and BMP -Strict intake and output -her weight is 136# today, DC IV lasix  and start torsemide  40 mg daily and monitor response Filed Weights   09/16/24 0433  09/17/24 0520 09/18/24 0500  Weight: 64 kg 65.8 kg 61.9 kg    Intake/Output Summary (Last 24 hours) at 09/18/2024 1251 Last data filed at 09/18/2024 1006 Gross per 24 hour  Intake 840 ml  Output 3250 ml  Net -2410 ml   RLS (restless legs syndrome) - Continue Requip   Anxiety disorder - Continue home meds  Coronary artery disease due to lipid rich plaque S/p CABG in 2007.  No chest pain. - Continue with home statin - Not on any antiplatelet due to the need for anticoagulation with warfarin  Permanent atrial fibrillation  Heart rate currently controlled. - Continue with home Cardizem  - warfarin per pharmacy dosing with daily INR test ordered  Acute on chronic respiratory failure with hypoxia  Patient was using up to 6 L of oxygen  on admission, history of 4 L at baseline. - Continue with supplemental oxygen -wean to baseline as tolerated  Supratherapeutic INR INR initially at 6.0, now improved.   No bleeding complications at this time. - Appreciate pharmacy help with monitoring - per pharm D give warfarin 2 mg today - daily PT/INR testing   Hypothyroidism - Continue home Synthroid   CKD (chronic kidney disease) stage 3, GFR 30-59 ml/min  - Patient with history of CKD stage IIIb, slight increase in creatinine but remained within her baseline. - Monitor renal function - creatinine improved to 1.46 - Avoid nephrotoxins  Hypokalemia - repleted  - Continue daily potassium supplement  S/P AVR (aortic valve replacement) INR supratherapeutic at 6.0 with goal of 2.5-3.5 due to mechanical aortic valve. - warfarin dosing per pharm D  GERD - Continue with PPI  Generalized Weakness Adult Failure to  thrive  -- pt now agreeable to SNF rehab -- [facility] consulted and planning for SNF on 12/1   DNR/DNI present on admission -- continue DNR order in hospital   DVT prophylaxis: warfarin Code Status: DNR  Family Communication:  Disposition: PLAN IS TO DC TO SNF ON 12/1 IF INSURANCE  AUTHORIZES  Consultants:  cardiology  Procedures:  Antimicrobials:    Subjective: Pt says that overall she is feeling better.  She is happy to see her weight down today.      Objective: Vitals:   09/18/24 0500 09/18/24 0536 09/18/24 0930 09/18/24 0933  BP:  98/66 (!) 100/57   Pulse:  77 73   Resp:  (!) 22 18   Temp:  97.9 F (36.6 C) 98 F (36.7 C)   TempSrc:  Oral Oral   SpO2:  98% 95% 97%  Weight: 61.9 kg     Height:        Intake/Output Summary (Last 24 hours) at 09/18/2024 1251 Last data filed at 09/18/2024 1006 Gross per 24 hour  Intake 840 ml  Output 3250 ml  Net -2410 ml   Filed Weights   09/16/24 0433 09/17/24 0520 09/18/24 0500  Weight: 64 kg 65.8 kg 61.9 kg   Examination:  General exam: Appears calm and comfortable  Respiratory system: no crackles heard today.  Respiratory effort normal. Cardiovascular system: normal S1 & S2 heard. No JVD, murmurs, rubs, gallops or clicks. No pedal edema. Gastrointestinal system: Abdomen is nondistended, soft and nontender. No organomegaly or masses felt. Normal bowel sounds heard. Central nervous system: Alert and oriented. No focal neurological deficits. Extremities: 1+ pretibial edema BLEs.  Symmetric 5 x 5 power. Skin: No rashes, lesions or ulcers. Psychiatry: Judgement and insight appear normal. Mood & affect appropriate.   Data Reviewed: I have personally reviewed following labs and imaging studies  CBC: Recent Labs  Lab 09/16/24 0433  WBC 5.2  HGB 9.2*  HCT 29.9*  MCV 88.5  PLT 193    Basic Metabolic Panel: Recent Labs  Lab 09/14/24 0428 09/15/24 0414 09/16/24 0433 09/17/24 0421 09/18/24 0523  NA 139 139 138 137 136  K 3.7 3.5 3.6 3.7 3.7  CL 95* 94* 95* 94* 93*  CO2 38* 39* 36* 35* 35*  GLUCOSE 78 88 77 77 77  BUN 23 24* 27* 28* 27*  CREATININE 1.56* 1.75* 1.61* 1.55* 1.46*  CALCIUM  8.9 9.0 9.2 9.2 9.2  MG 2.7*  --   --   --   --    CBG: No results for input(s): GLUCAP in the last 168  hours.  No results found for this or any previous visit (from the past 240 hours).   Radiology Studies: No results found.  Scheduled Meds:  ALPRAZolam   0.5 mg Oral BID   budesonide -glycopyrrolate -formoterol   2 puff Inhalation BID   dapagliflozin  propanediol  10 mg Oral Daily   diltiazem   120 mg Oral Daily   levothyroxine   50 mcg Oral Q0600   lumateperone  tosylate  42 mg Oral QHS   multivitamin with minerals  1 tablet Oral Daily   potassium chloride  SA  40 mEq Oral Daily   rOPINIRole   3 mg Oral BID   rosuvastatin   20 mg Oral Daily   torsemide   40 mg Oral Daily   warfarin  2 mg Oral ONCE-1600   Warfarin - Pharmacist Dosing Inpatient   Does not apply q1600   Continuous Infusions:   LOS: 10 days   Time spent: 55 mins  Afton Louder, MD How to contact the [facility] Attending or Consulting provider 7A - 7P or covering provider during after hours 7P -7A, for this patient?  Check the care team in [facility] and look for a) attending/consulting TRH provider listed and b) the [facility] team listed Log into [email or web address] to find provider on call.  Locate the [facility] provider you are looking for under [facility] and page to a number that you can be directly reached. If you still have difficulty reaching the provider, please page the [facility] (Director on Call) for the Hospitalists listed on amion for assistance.  09/18/2024, 12:51 PM

## 2024-09-18 NOTE — Consult Note (Signed)
 PHARMACY - ANTICOAGULATION CONSULT NOTE  Pharmacy Consult for Warfarin Indication: mAVR & Afib  Allergies  Allergen Reactions   Penicillin G Itching   Penicillins Hives, Itching, Swelling and Rash   Dust Mite Extract Other (See Comments)    Seasonal allergies    Patient Measurements: Height: 5' 7.99 (172.7 cm) Weight: 61.9 kg (136 lb 7.4 oz) IBW/kg (Calculated) : 63.88 HEPARIN  DW (KG): 64.8  Vital Signs: Temp: 97.9 F (36.6 C) (11/30 0536) Temp Source: Oral (11/30 0536) BP: 98/66 (11/30 0536) Pulse Rate: 77 (11/30 0536)  Labs: Recent Labs    09/16/24 0433 09/17/24 0421 09/18/24 0523  HGB 9.2*  --   --   HCT 29.9*  --   --   PLT 193  --   --   LABPROT 29.2* 25.9* 28.7*  INR 2.6* 2.2* 2.6*  CREATININE 1.61* 1.55* 1.46*    Estimated Creatinine Clearance: 34 mL/min (A) (by C-G formula based on SCr of 1.46 mg/dL (H)).   Medical History: Past Medical History:  Diagnosis Date   Anxiety disorder    Aortic stenosis    Status post St. Jude mechanical AVR 2007   Asthma    Atrial fibrillation (HCC)    Carcinoid tumor of colon (HCC) 2007   Chronic diastolic heart failure (HCC)    Coronary atherosclerosis of native coronary artery    Status post CABG 2007   GERD (gastroesophageal reflux disease)    History of colonoscopy 2003   Dr. 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 Shaaron, benign biopsies   RLS (restless legs syndrome)    Schatzki's ring     Medications:  Warfarin 3 mg daily except for 4 mg on Tuesday (TWD: 22 mg)  Last patient reported dose was 11/20 at noon  Assessment: 73 y/o F with medical history as above presenting with shortness of breath and CXR impression of pulmonary edema. Pharmacy consulted to dose warfarin while patient is admitted.  INR today is 5.6 > 6 > 5.2 > 4.1 > 3.2 > 3.1> 3.4>3.0>2.6.  Within range, no bleeding noted. Conservative redosing with  elevated INR on admission Hgb 8.2 > 8.4, stable  Goal of Therapy:  INR 2.0-3.0 with mAVR and Afib Monitor platelets by anticoagulation protocol: Yes   Plan:  Give 2 mg warfarin today PT INR daily Monitor for S/S of bleeding  Elspeth Sour, PharmD Clinical Pharmacist 09/18/2024 8:12 AM

## 2024-09-19 ENCOUNTER — Ambulatory Visit

## 2024-09-19 DIAGNOSIS — G2581 Restless legs syndrome: Secondary | ICD-10-CM

## 2024-09-19 DIAGNOSIS — E039 Hypothyroidism, unspecified: Secondary | ICD-10-CM

## 2024-09-19 DIAGNOSIS — K2100 Gastro-esophageal reflux disease with esophagitis, without bleeding: Secondary | ICD-10-CM

## 2024-09-19 DIAGNOSIS — E876 Hypokalemia: Secondary | ICD-10-CM | POA: Diagnosis not present

## 2024-09-19 DIAGNOSIS — I5033 Acute on chronic diastolic (congestive) heart failure: Secondary | ICD-10-CM | POA: Diagnosis not present

## 2024-09-19 LAB — BASIC METABOLIC PANEL WITH GFR
Anion gap: 7 (ref 5–15)
BUN: 28 mg/dL — ABNORMAL HIGH (ref 8–23)
CO2: 35 mmol/L — ABNORMAL HIGH (ref 22–32)
Calcium: 9.4 mg/dL (ref 8.9–10.3)
Chloride: 94 mmol/L — ABNORMAL LOW (ref 98–111)
Creatinine, Ser: 1.54 mg/dL — ABNORMAL HIGH (ref 0.44–1.00)
GFR, Estimated: 35 mL/min — ABNORMAL LOW (ref >60)
Glucose, Bld: 81 mg/dL (ref 70–99)
Potassium: 4.7 mmol/L (ref 3.5–5.1)
Sodium: 136 mmol/L (ref 135–145)

## 2024-09-19 LAB — CBC
HCT: 32.3 % — ABNORMAL LOW (ref 36.0–46.0)
Hemoglobin: 10 g/dL — ABNORMAL LOW (ref 12.0–15.0)
MCH: 27.3 pg (ref 26.0–34.0)
MCHC: 31 g/dL (ref 30.0–36.0)
MCV: 88.3 fL (ref 80.0–100.0)
Platelets: 197 K/uL (ref 150–400)
RBC: 3.66 MIL/uL — ABNORMAL LOW (ref 3.87–5.11)
RDW: 14.4 % (ref 11.5–15.5)
WBC: 5.8 K/uL (ref 4.0–10.5)
nRBC: 0 % (ref 0.0–0.2)

## 2024-09-19 LAB — PROTIME-INR
INR: 2.1 — ABNORMAL HIGH (ref 0.8–1.2)
Prothrombin Time: 25 s — ABNORMAL HIGH (ref 11.4–15.2)

## 2024-09-19 LAB — MAGNESIUM: Magnesium: 2.8 mg/dL — ABNORMAL HIGH (ref 1.7–2.4)

## 2024-09-19 MED ORDER — WARFARIN SODIUM 2 MG PO TABS
3.0000 mg | ORAL_TABLET | Freq: Once | ORAL | Status: AC
  Administered 2024-09-19: 3 mg via ORAL
  Filled 2024-09-19: qty 1

## 2024-09-19 NOTE — Progress Notes (Signed)
   Progress Note  Patient Name: [name] Date of Encounter: 09/19/2024  Primary Cardiologist: Jayson Sierras, MD  Interval Summary  Chart reviewed.  No shortness of breath or chest pain.  Recent weights 136 pounds 140 pounds.  She has been converted to oral diuretic regimen.  Renal function stable.  Anticipates discharge to SNF.  Vital Signs  Vitals:   09/18/24 2055 09/19/24 0500 09/19/24 0505 09/19/24 0731  BP: (!) 114/57  119/80   Pulse: 71  66   Resp: 16  18   Temp: (!) 97.4 F (36.3 C)  97.6 F (36.4 C)   TempSrc: Oral  Oral   SpO2: 97%  98% 98%  Weight:  63.9 kg    Height:        Intake/Output Summary (Last 24 hours) at 09/19/2024 0935 Last data filed at 09/19/2024 9096 Gross per 24 hour  Intake 240 ml  Output 1600 ml  Net -1360 ml   Filed Weights   09/17/24 0520 09/18/24 0500 09/19/24 0500  Weight: 65.8 kg 61.9 kg 63.9 kg    Physical Exam  GEN: No acute distress.   Neck: No JVD. Cardiac: Irregularly irregular with crisp mechanical prosthetic valve sound in S2. Respiratory: Nonlabored.  Clear anteriorly. GI: Soft, nontender, bowel sounds present. MS: Improved lower leg and ankle edema.  ECG/Telemetry  Telemetry reviewed showing rate controlled atrial fibrillation.  Labs  Chemistry Recent Labs  Lab 09/17/24 0421 09/18/24 0523 09/19/24 0518  NA 137 136 136  K 3.7 3.7 4.7  CL 94* 93* 94*  CO2 35* 35* 35*  GLUCOSE 77 77 81  BUN 28* 27* 28*  CREATININE 1.55* 1.46* 1.54*  CALCIUM  9.2 9.2 9.4  GFRNONAA 35* 38* 35*  ANIONGAP 8 7 7     Hematology Recent Labs  Lab 09/16/24 0433 09/19/24 0518  WBC 5.2 5.8  RBC 3.38* 3.66*  HGB 9.2* 10.0*  HCT 29.9* 32.3*  MCV 88.5 88.3  MCH 27.2 27.3  MCHC 30.8 31.0  RDW 14.3 14.4  PLT 193 197    Assessment & Plan  1.  Acute on chronic HFpEF.  LVEF 60 to 65% by echocardiogram in July.  RVSP moderately elevated at that time.  She has improved with diuresis, weight near baseline and clinically  stable.  Net urine output of 1600 cc last 24 hours.  Currently on Farxiga  10 mg daily, Demadex  40 mg daily, and KCl 40 mEq daily.   2.  Aortic stenosis status post St. Jude mechanical AVR.  Mean AV gradient 5 mmHg by echocardiogram in July, no significant aortic regurgitation at that time.   3.  Permanent atrial fibrillation, continues on Coumadin  with therapeutic INR of 2.1.  Heart rate controlled.   4.  CKD stage IIIb, creatinine 1.54 with GFR 35.  Relatively stable overall.  Clinically stable from a cardiac perspective for discharge to SNF.  Continue Coumadin , Farxiga  10 mg daily, Cardizem  CD 120 mg daily, Demadex  40 mg daily, KCl 40 mEq daily, and Crestor  20 mg daily.  Please schedule follow-up with us  in the next few weeks.  We will sign off.  For questions or updates, please contact [facility] Please consult [email or web address] for contact info under   Signed, Jayson Sierras, MD  09/19/2024, 9:35 AM

## 2024-09-19 NOTE — TOC Progression Note (Signed)
 Transition of Care [facility]) - Progression Note    Patient Details  Name: [name] MRN: [id number] Date of Birth: January 24, 1951  Transition of [facility]) CM/SW Contact  Sharlyne Stabs, RN Phone Number: 09/19/2024, 11:14 AM  Clinical Narrative:   [facility] still pending, [facility] has been closed for the Holiday. Confirmed bed offer with [facility]. Andrea needs patient to bring Caplyta  and inhaler from home. CM at the beside to discuss with patient and her son, He will take medication. CM explained we do not have authorization, Team updated DC summary has to be completed by noon for admission to [facility]. IPCM following, avoidable days added, hopeful for discharge tomorrow.    Expected Discharge Plan: Skilled Nursing Facility Barriers to Discharge: Insurance Authorization      Expected Discharge Plan and Services In-house Referral: Clinical Social Work Discharge Planning Services: NA Post Acute Care Choice: Home Health Living arrangements for the past 2 months: Single Family Home                 DME Arranged: N/A DME Agency: NA      Social Drivers of Health (SDOH) Interventions SDOH Screenings   Food Insecurity: No Food Insecurity (09/08/2024)  Housing: Unknown (09/08/2024)  Transportation Needs: No Transportation Needs (09/08/2024)  Utilities: Not At Risk (09/08/2024)  Financial Resource Strain: Low Risk (12/27/2023)   Received from [facility]  Physical Activity: Inactive (12/27/2023)   Received from [facility]  Social Connections: Moderately Isolated (09/08/2024)  Stress: Stress Concern Present (12/27/2023)   Received from [facility]  Tobacco Use: Medium Risk (09/08/2024)  Health Literacy: Low Risk (05/18/2024)   Received from [facility]    Readmission Risk Interventions    09/09/2024    3:42 PM 09/10/2023    2:02 PM  Readmission Risk Prevention Plan  Transportation Screening Complete Complete  PCP or Specialist Appt within 3-5 Days Complete   HRI or Home Care  Consult Complete Complete  Social Work Consult for Recovery Care Planning/Counseling Complete Complete  Palliative Care Screening Not Applicable Not Applicable  Medication Review Oceanographer) Complete Complete

## 2024-09-19 NOTE — Plan of Care (Signed)
   Problem: Education: Goal: Knowledge of General Education information will improve Description Including pain rating scale, medication(s)/side effects and non-pharmacologic comfort measures Outcome: Progressing   Problem: Health Behavior/Discharge Planning: Goal: Ability to manage health-related needs will improve Outcome: Progressing

## 2024-09-19 NOTE — Plan of Care (Signed)

## 2024-09-19 NOTE — Progress Notes (Signed)
 PROGRESS NOTE   [name]  FMW:[id number] DOB: 12/26/1950 DOA: 09/08/2024 PCP: Renato Dorothey HERO, NP   Chief Complaint  Patient presents with   Shortness of Breath   Level of care: Telemetry  Brief Admission History:  73 y.o. female with longstanding history of COPD and chronic respiratory failure typically on 4 L/min supplemental oxygen , chronic atrial fibrillation, HTN, HLD, CAD status post 2V CABG with mechanical aortic valve replacement, presented to ED with complaints of worsening shortness of breath over the last 3 days, also reports orthopnea.  Per patient she was compliant with her home diuretics and salt restrictions.   On presentation she was hypoxic at 85% and requiring up to 6 L of oxygen .  Chest x-ray with cardiomegaly, chronic right pleural effusion and pulmonary edema.  Significantly elevated proBNP at 5004, INR elevated at 5.6, potassium at 2.9.   Patient was admitted for acute on chronic hypoxic respiratory failure secondary to acute on chronic HFpEF.  Started on IV diuresis.  Cardiology was consulted.   Patient also has a possible LA mass which was noted on prior echoes.  Patient did not pursue TEE or further investigation.   Assessment and Plan:  Acute HFpEF 2D echo 04/28/2024 significant for preserved EF at 60% with indeterminate diastolic parameters, but significant hypertrophy and anterior ventricular septum flattening. proBNP significantly elevated at 5004, chest x-ray with pulmonary vascular congestion and pleural effusion. Cardiology was consulted and patient was started on IV Lasix   -treated with IV Lasix  80 mg but reduced dose to once daily due to bump in BUN/Cr. Continue potassium supplement, renal function improved after reduction in furosemide  dose, continue current dose for now -Daily weight and BMP -Strict intake and output -her weight is 136# today, DC IV lasix  and start torsemide  40 mg daily and monitor response Filed Weights   09/17/24 0520  09/18/24 0500 09/19/24 0500  Weight: 65.8 kg 61.9 kg 63.9 kg    Intake/Output Summary (Last 24 hours) at 09/19/2024 1911 Last data filed at 09/19/2024 1826 Gross per 24 hour  Intake 720 ml  Output 600 ml  Net 120 ml   RLS (restless legs syndrome) - Continue Requip   Anxiety disorder - Continue home meds  Coronary artery disease due to lipid rich plaque S/p CABG in 2007.  No chest pain. - Continue with home statin - Not on any antiplatelet due to the need for anticoagulation with warfarin  Permanent atrial fibrillation  Heart rate currently controlled. - Continue with home Cardizem  - warfarin per pharmacy dosing with daily INR test ordered  Acute on chronic respiratory failure with hypoxia  Patient was using up to 6 L of oxygen  on admission, history of 4 L at baseline. - Continue with supplemental oxygen -wean to baseline as tolerated  Supratherapeutic INR INR initially at 6.0, now improved.   No bleeding complications at this time. - Appreciate pharmacy help with monitoring - per pharm D give warfarin 2 mg today - daily PT/INR testing   Hypothyroidism - Continue home Synthroid   CKD (chronic kidney disease) stage 3, GFR 30-59 ml/min  - Patient with history of CKD stage IIIb, slight increase in creatinine but remained within her baseline. - Monitor renal function - creatinine improved to 1.46 - Avoid nephrotoxins  Hypokalemia - repleted  - Continue daily potassium supplement  S/P AVR (aortic valve replacement) INR supratherapeutic at 6.0 with goal of 2.5-3.5 due to mechanical aortic valve. - warfarin dosing per pharm D  GERD - Continue with PPI  Generalized Weakness  Adult Failure to thrive  -- pt now agreeable to SNF rehab -- [facility] consulted and planning for SNF on 12/1   DNR/DNI present on admission -- continue DNR order in hospital   DVT prophylaxis: warfarin Code Status: DNR  Family Communication:  Disposition: PLAN IS TO DC TO SNF ON 12/1 IF INSURANCE  AUTHORIZES  Consultants:  cardiology  Procedures:  Antimicrobials:    Subjective: No chest pain, no nausea, no vomiting.  Reports being weak and deconditioned but in no acute distress.  Good saturation on chronic supplementation.  Good urine output with oral diuretics.  Objective: Vitals:   09/19/24 0500 09/19/24 0505 09/19/24 0731 09/19/24 1304  BP:  119/80  96/68  Pulse:  66  77  Resp:  18  18  Temp:  97.6 F (36.4 C)  98 F (36.7 C)  TempSrc:  Oral  Oral  SpO2:  98% 98% 97%  Weight: 63.9 kg     Height:        Intake/Output Summary (Last 24 hours) at 09/19/2024 1911 Last data filed at 09/19/2024 1826 Gross per 24 hour  Intake 720 ml  Output 600 ml  Net 120 ml   Filed Weights   09/17/24 0520 09/18/24 0500 09/19/24 0500  Weight: 65.8 kg 61.9 kg 63.9 kg   Examination: General exam: Alert, awake, oriented x 3; in no acute distress. Respiratory system: Good saturation on chronic supplementation; no using accessory muscles.  Patient denies orthopnea.  No wheezing or crackles appreciated on exam. Cardiovascular system: Rate controlled, no rubs, no gallops, no JVD. Gastrointestinal system: Abdomen is nondistended, soft and nontender. No organomegaly or masses felt. Normal bowel sounds heard. Central nervous system: Moving 4 limbs spontaneously; generally weak.  No focal neurological deficits. Extremities: No cyanosis or clubbing; trace edema appreciated bilaterally. Skin: No petechiae. Psychiatry: Judgement and insight appear normal. Mood & affect appropriate.   Data Reviewed: I have personally reviewed following labs and imaging studies  CBC: Recent Labs  Lab 09/16/24 0433 09/19/24 0518  WBC 5.2 5.8  HGB 9.2* 10.0*  HCT 29.9* 32.3*  MCV 88.5 88.3  PLT 193 197    Basic Metabolic Panel: Recent Labs  Lab 09/14/24 0428 09/15/24 0414 09/16/24 0433 09/17/24 0421 09/18/24 0523 09/19/24 0518  NA 139 139 138 137 136 136  K 3.7 3.5 3.6 3.7 3.7 4.7  CL 95* 94*  95* 94* 93* 94*  CO2 38* 39* 36* 35* 35* 35*  GLUCOSE 78 88 77 77 77 81  BUN 23 24* 27* 28* 27* 28*  CREATININE 1.56* 1.75* 1.61* 1.55* 1.46* 1.54*  CALCIUM  8.9 9.0 9.2 9.2 9.2 9.4  MG 2.7*  --   --   --   --  2.8*   CBG: No results for input(s): GLUCAP in the last 168 hours.  No results found for this or any previous visit (from the past 240 hours).   Radiology Studies: No results found.  Scheduled Meds:  ALPRAZolam   0.5 mg Oral BID   budesonide -glycopyrrolate -formoterol   2 puff Inhalation BID   dapagliflozin  propanediol  10 mg Oral Daily   diltiazem   120 mg Oral Daily   levothyroxine   50 mcg Oral Q0600   lumateperone  tosylate  42 mg Oral QHS   multivitamin with minerals  1 tablet Oral Daily   potassium chloride  SA  40 mEq Oral Daily   rOPINIRole   3 mg Oral BID   rosuvastatin   20 mg Oral Daily   torsemide   40 mg Oral Daily  Warfarin - Pharmacist Dosing Inpatient   Does not apply q1600   Continuous Infusions:   LOS: 11 days   Time spent: 35 mins  Eric Nunnery, MD How to contact the [facility] Attending or Consulting provider 7A - 7P or covering provider during after hours 7P -7A, for this patient?  Check the care team in [facility] and look for a) attending/consulting TR[facility] provider listed and b) the TR[facility] team listed Log into [email or web address] to find provider on call.  Locate the [facility] provider you are looking for under [facility] and page to a number that you can be directly reached. If you still have difficulty reaching the provider, please page the [facility] (Director on Call) for the Hospitalists listed on amion for assistance.  09/19/2024, 7:11 PM

## 2024-09-19 NOTE — Consult Note (Signed)
 PHARMACY - ANTICOAGULATION CONSULT NOTE  Pharmacy Consult for Warfarin Indication: mAVR & Afib  Allergies  Allergen Reactions   Penicillin G Itching   Penicillins Hives, Itching, Swelling and Rash   Dust Mite Extract Other (See Comments)    Seasonal allergies    Patient Measurements: Height: 5' 7.99 (172.7 cm) Weight: 63.9 kg (140 lb 12.8 oz) IBW/kg (Calculated) : 63.88 HEPARIN  DW (KG): 64.8  Vital Signs: Temp: 97.6 F (36.4 C) (12/01 0505) Temp Source: Oral (12/01 0505) BP: 119/80 (12/01 0505) Pulse Rate: 66 (12/01 0505)  Labs: Recent Labs    09/17/24 0421 09/18/24 0523 09/19/24 0518  HGB  --   --  10.0*  HCT  --   --  32.3*  PLT  --   --  197  LABPROT 25.9* 28.7* 25.0*  INR 2.2* 2.6* 2.1*  CREATININE 1.55* 1.46* 1.54*    Estimated Creatinine Clearance: 33.3 mL/min (A) (by C-G formula based on SCr of 1.54 mg/dL (H)).   Medical History: Past Medical History:  Diagnosis Date   Anxiety disorder    Aortic stenosis    Status post St. Jude mechanical AVR 2007   Asthma    Atrial fibrillation (HCC)    Carcinoid tumor of colon (HCC) 2007   Chronic diastolic heart failure (HCC)    Coronary atherosclerosis of native coronary artery    Status post CABG 2007   GERD (gastroesophageal reflux disease)    History of colonoscopy 2003   Dr. 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 Shaaron, benign biopsies   RLS (restless legs syndrome)    Schatzki's ring     Medications:  Warfarin 3 mg daily except for 4 mg on Tuesday (TWD: 22 mg)  Last patient reported dose was 11/20 at noon  Assessment: 73 y/o F with medical history as above presenting with shortness of breath and CXR impression of pulmonary edema. Pharmacy consulted to dose warfarin while patient is admitted.  INR today is 5.6 > 6 > 5.2 > 4.1 > 3.2 > 3.1> 3.4>3.0>2.6>2.1.  Within range, no bleeding noted. Conservative redosing  with elevated INR on admission CBC WNL  Goal of Therapy:  INR 2.0-3.0 with mAVR and Afib Monitor platelets by anticoagulation protocol: Yes   Plan:  Give 3 mg warfarin today PT INR daily Monitor for S/S of bleeding  Elspeth Sour, PharmD Clinical Pharmacist 09/19/2024 7:52 AM

## 2024-09-19 NOTE — Plan of Care (Signed)
   Problem: Education: Goal: Knowledge of General Education information will improve Description: Including pain rating scale, medication(s)/side effects and non-pharmacologic comfort measures Outcome: Progressing   Problem: Clinical Measurements: Goal: Ability to maintain clinical measurements within normal limits will improve Outcome: Progressing Goal: Will remain free from infection Outcome: Progressing

## 2024-09-20 DIAGNOSIS — Z952 Presence of prosthetic heart valve: Secondary | ICD-10-CM | POA: Diagnosis not present

## 2024-09-20 DIAGNOSIS — G2581 Restless legs syndrome: Secondary | ICD-10-CM | POA: Diagnosis not present

## 2024-09-20 DIAGNOSIS — F418 Other specified anxiety disorders: Secondary | ICD-10-CM | POA: Diagnosis not present

## 2024-09-20 DIAGNOSIS — I5033 Acute on chronic diastolic (congestive) heart failure: Secondary | ICD-10-CM | POA: Diagnosis not present

## 2024-09-20 LAB — PROTIME-INR
INR: 2.2 — ABNORMAL HIGH (ref 0.8–1.2)
Prothrombin Time: 25.6 s — ABNORMAL HIGH (ref 11.4–15.2)

## 2024-09-20 MED ORDER — TORSEMIDE 40 MG PO TABS: 40.0000 mg | ORAL_TABLET | Freq: Every day | ORAL | Status: DC

## 2024-09-20 MED ORDER — ALPRAZOLAM 0.5 MG PO TABS: 0.5000 mg | ORAL_TABLET | Freq: Two times a day (BID) | ORAL | 0 refills | Status: AC

## 2024-09-20 MED ORDER — DAPAGLIFLOZIN PROPANEDIOL 10 MG PO TABS: 10.0000 mg | ORAL_TABLET | Freq: Every day | ORAL | Status: AC

## 2024-09-20 MED ORDER — WARFARIN SODIUM 2 MG PO TABS: 3.0000 mg | ORAL_TABLET | Freq: Once | ORAL | Status: DC

## 2024-09-20 MED ORDER — HYDROCODONE-ACETAMINOPHEN 10-325 MG PO TABS: 1.0000 | ORAL_TABLET | Freq: Four times a day (QID) | ORAL | 0 refills | Status: AC | PRN

## 2024-09-20 NOTE — TOC Transition Note (Signed)
 Transition of Care [facility]) - Discharge Note   Patient Details  Name: [name] MRN: [id number] Date of Birth: 10/31/1950  [facility] CM/SW Contact:  Sharlyne Stabs, RN Phone Number: 09/20/2024, 10:26 AM   Clinical Narrative:   Firefighter received, Clinicals sent in the hub, Beaver provided room number, RN calling report, med necessity printed, Son updated. IPCM calling to schedule EMS.     Final next level of care: Skilled Nursing Facility Barriers to Discharge: Barriers Resolved   Patient Goals and CMS Choice Patient states their goals for this hospitalization and ongoing recovery are:: agreeable to SNF CMS Medicare.gov Compare Post Acute Care list provided to:: Patient Choice offered to / list presented to : Patient      Discharge Placement                Patient to be transferred to facility by: EMS Name of family member notified: SON at the bedside Patient and family notified of of transfer: 09/20/24  Discharge Plan and Services Additional resources added to the After Visit Summary for   In-house Referral: Clinical Social Work Discharge Planning Services: NA Post Acute Care Choice: Home Health          DME Arranged: N/A DME Agency: NA                  Social Drivers of Health (SDOH) Interventions SDOH Screenings   Food Insecurity: No Food Insecurity (09/08/2024)  Housing: Patient Declined (09/19/2024)  Transportation Needs: No Transportation Needs (09/08/2024)  Utilities: Not At Risk (09/08/2024)  Financial Resource Strain: Low Risk (12/27/2023)   Received from [facility]  Physical Activity: Inactive (12/27/2023)   Received from [facility]  Social Connections: Moderately Isolated (09/08/2024)  Stress: Stress Concern Present (12/27/2023)   Received from [facility]  Tobacco Use: Medium Risk (09/08/2024)  Health Literacy: Low Risk (05/18/2024)   Received from [facility]     Readmission Risk Interventions     09/09/2024    3:42 PM 09/10/2023    2:02 PM  Readmission Risk Prevention Plan  Transportation Screening Complete Complete  PCP or Specialist Appt within 3-5 Days Complete   HRI or Home Care Consult Complete Complete  Social Work Consult for Recovery Care Planning/Counseling Complete Complete  Palliative Care Screening Not Applicable Not Applicable  Medication Review Oceanographer) Complete Complete

## 2024-09-20 NOTE — Care Management Important Message (Signed)
 Important Message  Patient Details  Name: [name] MRN: [id number] Date of Birth: Jun 07, 1951   Important Message Given:  Yes - Medicare IM     Shaaron Golliday L Zakar Brosch 09/20/2024, 11:45 AM

## 2024-09-20 NOTE — Progress Notes (Signed)
 Patient dressed and belongings taken by son. EMS transporting patient now to [facility].

## 2024-09-20 NOTE — Discharge Summary (Signed)
 Physician Discharge Summary   Patient: [name] MRN: [id number] DOB: 1951-02-07  Admit date:     09/08/2024  Discharge date: 09/20/24  Discharge Physician: Eric Nunnery   PCP: Renato Dorothey HERO, NP   Recommendations at discharge:  Repeat BMET to follow electrolytes and renal function. Reassess BP and adjust medications as needed. Make sure patient follow up with cardiology service as recommended.  Discharge Diagnoses: Principal Problem:   Acute on chronic diastolic CHF (congestive heart failure) (HCC) Active Problems:   Acute on chronic respiratory failure with hypoxia (HCC)   Hypokalemia   CKD (chronic kidney disease) stage 3, GFR 30-59 ml/min (HCC)   Permanent atrial fibrillation (HCC)   S/P AVR (aortic valve replacement)   Supratherapeutic INR   Coronary artery disease due to lipid rich plaque   Hypothyroidism   GERD   Anxiety disorder   RLS (restless legs syndrome)   Chronic anticoagulation   Chronic anemia   Chronic hypoxic respiratory failure (HCC)   Hx of CABG   Long term (current) use of anticoagulants   Coronary artery disease involving coronary bypass graft of native heart without angina pectoris   History of mechanical aortic valve replacement   Acute on chronic diastolic heart failure (HCC)  Brief Admission History:  73 y.o. female with longstanding history of COPD and chronic respiratory failure typically on 4 L/min supplemental oxygen , chronic atrial fibrillation, HTN, HLD, CAD status post 2V CABG with mechanical aortic valve replacement, presented to ED with complaints of worsening shortness of breath over the last 3 days, also reports orthopnea.  Per patient she was compliant with her home diuretics and salt restrictions.   On presentation she was hypoxic at 85% and requiring up to 6 L of oxygen .  Chest x-ray with cardiomegaly, chronic right pleural effusion and pulmonary edema.  Significantly elevated proBNP at 5004, INR elevated at 5.6, potassium at  2.9.   Patient was admitted for acute on chronic hypoxic respiratory failure secondary to acute on chronic HFpEF.  Started on IV diuresis.  Cardiology was consulted.   Patient also has a possible LA mass which was noted on prior echoes.  Patient did not pursue TEE or further investigation.  Assessment and Plan: Acute HFpEF 2D echo 04/28/2024 significant for preserved EF at 60% with indeterminate diastolic parameters, but significant hypertrophy and anterior ventricular septum flattening. proBNP significantly elevated at 5004, chest x-ray with pulmonary vascular congestion and pleural effusion. Cardiology was consulted and patient was started on IV Lasix   -treated with IV Lasix  80 mg but reduced dose to once daily due to bump in BUN/Cr. Continue potassium supplement, renal function improved after reduction in furosemide  dose, continue current dose for now -Daily weight and BMP -Strict intake and output -her weight is 136# today, continue demadex  40mg  daily and farxiga .       Filed Weights    09/17/24 0520 09/18/24 0500 09/19/24 0500  Weight: 65.8 kg 61.9 kg 63.9 kg      Intake/Output Summary (Last 24 hours) at 09/19/2024 1911 Last data filed at 09/19/2024 1826    Gross per 24 hour  Intake 720 ml  Output 600 ml  Net 120 ml    RLS (restless legs syndrome) - Continue Requip    Anxiety disorder - Continue home meds   Coronary artery disease due to lipid rich plaque S/p CABG in 2007.  No chest pain. - Continue with home statin - Not on any antiplatelet due to the need for anticoagulation with warfarin  Permanent atrial fibrillation  Heart rate currently controlled. - Continue with home Cardizem  - warfarin per pharmacy dosing with daily INR test ordered   Acute on chronic respiratory failure with hypoxia  Patient was using up to 6 L of oxygen  on admission, history of 4 L at baseline. - weaned down to baseline at discharge -no using accessory muscles -continue bronchodilator  management.    Supratherapeutic INR INR initially at 6.0, now improved.   No bleeding complications at this time. - Appreciate pharmacy help with monitoring - per pharm D give warfarin 2 mg today - daily PT/INR testing    Hypothyroidism - Continue home Synthroid    CKD (chronic kidney disease) stage 3, GFR 30-59 ml/min  - Patient with history of CKD stage IIIb, slight increase in creatinine but remained within her baseline. - Monitor renal function - creatinine improved to 1.46 and stable - Avoid nephrotoxins   Hypokalemia - repleted  - Continue daily potassium supplement   S/P AVR (aortic valve replacement) INR supratherapeutic at 6.0 with goal of 2.5-3.5 due to mechanical aortic valve. - warfarin dosing per pharm D   GERD - Continue with PPI   Generalized Weakness Adult Failure to thrive  -- pt now agreeable to SNF rehab -- [facility] consulted and planning for SNF on 12/1    DNR/DNI present on admission -- continue DNR order in hospital   Consultants: cardiology  Procedures performed: see below for x-ray reports.  Disposition: Home Diet recommendation: heart healthy/low sodium diet.  DISCHARGE MEDICATION: Allergies as of 09/20/2024       Reactions   Penicillin G Itching   Penicillins Hives, Itching, Swelling, Rash   Dust Mite Extract Other (See Comments)   Seasonal allergies        Medication List     STOP taking these medications    diltiazem  120 MG 24 hr capsule Commonly known as: CARDIZEM  CD   ipratropium 0.03 % nasal spray Commonly known as: ATROVENT        TAKE these medications    albuterol  108 (90 Base) MCG/ACT inhaler Commonly known as: VENTOLIN  HFA Inhale 1-2 puffs into the lungs daily as needed for wheezing or shortness of breath.   ALPRAZolam  0.5 MG tablet Commonly known as: XANAX  Take 1 tablet (0.5 mg total) by mouth 2 (two) times daily.   Caplyta  42 MG capsule Generic drug: lumateperone  tosylate Take 42 mg by mouth at bedtime.    dapagliflozin  propanediol 10 MG Tabs tablet Commonly known as: FARXIGA  Take 1 tablet (10 mg total) by mouth daily. Start taking on: September 21, 2024   diltiazem  120 MG 24 hr capsule Commonly known as: TIAZAC  Take 120 mg by mouth daily.   HYDROcodone -acetaminophen  10-325 MG tablet Commonly known as: NORCO Take 1 tablet by mouth every 6 (six) hours as needed for moderate pain (pain score 4-6).   levothyroxine  50 MCG tablet Commonly known as: SYNTHROID  Take 50 mcg by mouth daily before breakfast.   multivitamin with minerals Tabs tablet Take 1 tablet by mouth daily.   OXYGEN  Inhale 4 L into the lungs daily.   potassium chloride  SA 20 MEQ tablet Commonly known as: KLOR-CON  M Take 2 tablets (40 mEq total) by mouth daily.   rOPINIRole  3 MG tablet Commonly known as: REQUIP  Take 1 tablet (3 mg total) by mouth 2 (two) times daily.   rosuvastatin  20 MG tablet Commonly known as: CRESTOR  TAKE ONE (1) TABLET BY MOUTH EVERY DAY   Torsemide  40 MG Tabs Take 40 mg by  mouth daily. Start taking on: September 21, 2024 What changed:  medication strength how much to take when to take this   Trelegy Ellipta 100-62.5-25 MCG/ACT Aepb Generic drug: Fluticasone -Umeclidin-Vilant Inhale 1 puff into the lungs daily.   warfarin 2 MG tablet Commonly known as: COUMADIN  Take as directed. If you are unsure how to take this medication, talk to your nurse or doctor. Original instructions: Take 1.5-2 tablets (3-4 mg total) by mouth See admin instructions. 4mg  on Tuesday and 3 mg daily the rest of the week.        Contact information for follow-up providers     [facility]. Go on 09/26/2024.   Specialty: Cardiology Why: Hospital Follow-Up 09/26/2024 @ 2:45PM Please bring all medications to follow-up appointment [facility], Entrance C off of [address] [employer] Parking at the door or use Gate Code # [id number] to park under the  [employer] information: [address] [place]  [address] [phone]        Renato Dorothey HERO, NP. Schedule an appointment as soon as possible for a visit in 10 day(s).   Specialty: Internal Medicine Why: After discharge from a skilled nursing facility. Contact information: [address] [place] [address] [phone]              Contact information for after-discharge care     Destination     [facility] .   Service: Skilled Nursing Contact information: [address] [place]  [address] [phone]             Home Medical Care     [facility]) .   Service: Home Health Services Contact information: [address] [place]  [address] [phone]                    Discharge Exam: Fredricka Weights   09/18/24 0500 09/19/24 0500 09/20/24 0423  Weight: 61.9 kg 63.9 kg 64.8 kg   General exam: Alert, awake, oriented x 3; in no acute distress. Respiratory system: Good saturation on chronic supplementation; no using accessory muscles.  Patient denies orthopnea.  No wheezing or crackles appreciated on exam. Cardiovascular system: Rate controlled, no rubs, no gallops, no JVD. Gastrointestinal system: Abdomen is nondistended, soft and nontender. No organomegaly or masses felt. Normal bowel sounds heard. Central nervous system: Moving 4 limbs spontaneously; generally weak.  No focal neurological deficits. Extremities: No cyanosis or clubbing; trace edema appreciated bilaterally. Skin: No petechiae. Psychiatry: Judgement and insight appear normal. Mood & affect appropriate.   Condition at discharge: stable  The results of significant diagnostics from this hospitalization (including imaging, microbiology, ancillary and laboratory) are listed below for reference.   Imaging Studies: DG Chest Port 1 View Result Date: 09/12/2024 EXAM: 1 VIEW(S)  XRAY OF THE CHEST 09/12/2024 12:20:00 PM COMPARISON: 09/08/2024 CLINICAL HISTORY: SOB (shortness of breath) FINDINGS: LINES, TUBES AND DEVICES: Aortic valve replacement is noted. LUNGS AND PLEURA: Diffuse interstitial opacities favoring interstitial pulmonary edema, similar to prior. Similar right pleural effusion. No pneumothorax. HEART AND MEDIASTINUM: Stable cardiomegaly. Aortic atherosclerosis is present. BONES AND SOFT TISSUES: Prior median sternotomy is noted. The frontal projection is rotated to the right, reducing diagnostic sensitivity and specificity. IMPRESSION: 1. Diffuse interstitial opacities favoring interstitial pulmonary edema, similar to prior. 2. Similar right pleural effusion. 3. Cardiomegaly, stable. Electronically signed by: Ryan Salvage MD 09/12/2024 09:09 PM EST RP  Workstation: [id number]   DG Chest Portable 1 View Result Date: 09/08/2024 CLINICAL DATA:  Shortness of breath. EXAM: PORTABLE CHEST 1 VIEW COMPARISON:  05/05/2024 FINDINGS: Stable cardiac enlargement and prominent mediastinal and hilar contours. Tortuosity and calcification of the aorta again noted. Peribronchial thickening and increased interstitial markings likely pulmonary edema superimposed on chronic bronchitic changes. There is a chronic right pleural effusion and overlying atelectasis and or scarring. No pneumothorax. IMPRESSION: Cardiac enlargement, pulmonary edema and chronic right pleural effusion. Electronically Signed   By: MYRTIS Stammer M.D.   On: 09/08/2024 19:01    Microbiology: Results for orders placed or performed during the hospital encounter of 05/05/24  Resp panel by RT-PCR (RSV, Flu A&B, Covid) Anterior Nasal Swab     Status: None   Collection Time: 05/05/24  6:58 PM   Specimen: Anterior Nasal Swab  Result Value Ref Range Status   SARS Coronavirus 2 by RT PCR NEGATIVE NEGATIVE Final    Comment: (NOTE) SARS-CoV-2 target nucleic acids are NOT DETECTED.  The SARS-CoV-2 RNA is generally  detectable in upper respiratory specimens during the acute phase of infection. The lowest concentration of SARS-CoV-2 viral copies this assay can detect is 138 copies/mL. A negative result does not preclude SARS-Cov-2 infection and should not be used as the sole basis for treatment or other patient management decisions. A negative result may occur with  improper specimen collection/handling, submission of specimen other than nasopharyngeal swab, presence of viral mutation(s) within the areas targeted by this assay, and inadequate number of viral copies(<138 copies/mL). A negative result must be combined with clinical observations, patient history, and epidemiological information. The expected result is Negative.  Fact Sheet for Patients:  bloggercourse.com  Fact Sheet for Healthcare Providers:  seriousbroker.it  This test is no t yet approved or cleared by the [place]  FDA and  has been authorized for detection and/or diagnosis of SARS-CoV-2 by FDA under an Emergency Use Authorization (EUA). This EUA will remain  in effect (meaning this test can be used) for the duration of the COVID-19 declaration under Section 564(b)(1) of the Act, 21 U.S.C.section 360bbb-3(b)(1), unless the authorization is terminated  or revoked sooner.       Influenza A by PCR NEGATIVE NEGATIVE Final   Influenza B by PCR NEGATIVE NEGATIVE Final    Comment: (NOTE) The Xpert Xpress SARS-CoV-2/FLU/RSV plus assay is intended as an aid in the diagnosis of influenza from Nasopharyngeal swab specimens and should not be used as a sole basis for treatment. Nasal washings and aspirates are unacceptable for Xpert Xpress SARS-CoV-2/FLU/RSV testing.  Fact Sheet for Patients: bloggercourse.com  Fact Sheet for Healthcare Providers: seriousbroker.it  This test is not yet approved or cleared by the [place]  FDA  and has been authorized for detection and/or diagnosis of SARS-CoV-2 by FDA under an Emergency Use Authorization (EUA). This EUA will remain in effect (meaning this test can be used) for the duration of the COVID-19 declaration under Section 564(b)(1) of the Act, 21 U.S.C. section 360bbb-3(b)(1), unless the authorization is terminated or revoked.     Resp Syncytial Virus by PCR NEGATIVE NEGATIVE Final    Comment: (NOTE) Fact Sheet for Patients: bloggercourse.com  Fact Sheet for Healthcare Providers: seriousbroker.it  This test is not yet approved or cleared by the [place]  FDA and has been authorized for detection and/or diagnosis of SARS-CoV-2 by FDA under an Emergency Use Authorization (EUA). This EUA will remain in effect (meaning this test can be used) for the duration of the  COVID-19 declaration under Section 564(b)(1) of the Act, 21 U.S.C. section 360bbb-3(b)(1), unless the authorization is terminated or revoked.  Performed at [facility], [address] [place], [place] [address]   MRSA Next Gen by PCR, Nasal     Status: None   Collection Time: 05/05/24 11:00 PM   Specimen: Nasal Mucosa; Nasal Swab  Result Value Ref Range Status   MRSA by PCR Next Gen NOT DETECTED NOT DETECTED Final    Comment: (NOTE) The GeneXpert MRSA Assay (FDA approved for NASAL specimens only), is one component of a comprehensive MRSA colonization surveillance program. It is not intended to diagnose MRSA infection nor to guide or monitor treatment for MRSA infections. Test performance is not FDA approved in patients less than 20 years old. Performed at [facility], [address], [place], [place] [address]     Labs: CBC: Recent Labs  Lab 09/16/24 0433 09/19/24 0518  WBC 5.2 5.8  HGB 9.2* 10.0*  HCT 29.9* 32.3*  MCV 88.5 88.3  PLT 193 197   Basic Metabolic Panel: Recent Labs  Lab 09/14/24 0428 09/15/24 0414 09/16/24 0433  09/17/24 0421 09/18/24 0523 09/19/24 0518  NA 139 139 138 137 136 136  K 3.7 3.5 3.6 3.7 3.7 4.7  CL 95* 94* 95* 94* 93* 94*  CO2 38* 39* 36* 35* 35* 35*  GLUCOSE 78 88 77 77 77 81  BUN 23 24* 27* 28* 27* 28*  CREATININE 1.56* 1.75* 1.61* 1.55* 1.46* 1.54*  CALCIUM  8.9 9.0 9.2 9.2 9.2 9.4  MG 2.7*  --   --   --   --  2.8*   Liver Function Tests: No results for input(s): AST, ALT, ALKPHOS, BILITOT, PROT, ALBUMIN in the last 168 hours. CBG: No results for input(s): GLUCAP in the last 168 hours.  Discharge time spent:  35 minutes.  Signed: Eric Nunnery, MD [facility] 09/20/2024

## 2024-09-20 NOTE — Consult Note (Signed)
 PHARMACY - ANTICOAGULATION CONSULT NOTE  Pharmacy Consult for Warfarin Indication: mAVR & Afib  Allergies  Allergen Reactions   Penicillin G Itching   Penicillins Hives, Itching, Swelling and Rash   Dust Mite Extract Other (See Comments)    Seasonal allergies    Patient Measurements: Height: 5' 7.99 (172.7 cm) Weight: 64.8 kg (142 lb 13.7 oz) IBW/kg (Calculated) : 63.88 HEPARIN  DW (KG): 64.8  Vital Signs:    Labs: Recent Labs    09/18/24 0523 09/19/24 0518 09/20/24 0450  HGB  --  10.0*  --   HCT  --  32.3*  --   PLT  --  197  --   LABPROT 28.7* 25.0* 25.6*  INR 2.6* 2.1* 2.2*  CREATININE 1.46* 1.54*  --     Estimated Creatinine Clearance: 33.3 mL/min (A) (by C-G formula based on SCr of 1.54 mg/dL (H)).   Medical History: Past Medical History:  Diagnosis Date   Anxiety disorder    Aortic stenosis    Status post St. Jude mechanical AVR 2007   Asthma    Atrial fibrillation (HCC)    Carcinoid tumor of colon (HCC) 2007   Chronic diastolic heart failure (HCC)    Coronary atherosclerosis of native coronary artery    Status post CABG 2007   GERD (gastroesophageal reflux disease)    History of colonoscopy 2003   Dr. 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 Shaaron, benign biopsies   RLS (restless legs syndrome)    Schatzki's ring     Medications:  Warfarin 3 mg daily except for 4 mg on Tuesday (TWD: 22 mg)  Last patient reported dose was 11/20 at noon  Assessment: 73 y/o F with medical history as above presenting with shortness of breath and CXR impression of pulmonary edema. Pharmacy consulted to dose warfarin while patient is admitted.  INR today is 5.6 > 6 > 5.2 > 4.1 > 3.2 > 3.1> 3.4>3.0>2.6>2.1> 2.2.  Within range, no bleeding noted.   CBC WNL  Goal of Therapy:  INR 2.0-3.0 with mAVR and Afib Monitor platelets by anticoagulation protocol: Yes   Plan:  Give 3 mg  warfarin today PT INR daily Monitor for S/S of bleeding  Cherlyn Boers, BS Pharm D, BCPS Clinical Pharmacist 09/20/2024 9:15 AM

## 2024-09-20 NOTE — Progress Notes (Signed)
 Patient has discharge orders, discharge teaching given to patient and son bedside, report given to receiving facility [facility] nurse and no further questions at this time.

## 2024-09-22 NOTE — Nursing Note (Signed)
 PO meds tolerated. Fluids accepted well for hydration. Alert and verbal, able to make needs known. Some confusion noted during the night. Resting quietly in bed, call bell in reach.

## 2024-09-22 NOTE — Care Plan (Signed)
 Functional Abilities & Goals Team Meeting 09/22/2024 8:54 AM    Patient Name: [name] MRN:  [id number] Admit Date/Time: 09/20/2024  2:26 PM Date of Birth:  1951/05/09 Sex:  Female Room/Bed: W157/W157-01   Self Care Decision for Usual Performance:  Eating: Setup or clean-up assistance Toileting: Dependent   Mobility Decision for Usual Performance: Sit to Lying: Supervision or touching assistance Lying to Sitting on the Side of the Bed: Supervision or touching assistance Sit to Stand: Partial / moderate assistance Chair/bed to Chair Transfer: Partial / moderate assistance Toilet Transfer: Not applicable

## 2024-09-23 ENCOUNTER — Telehealth (HOSPITAL_COMMUNITY): Payer: Self-pay

## 2024-09-23 NOTE — LTC Provider Review (Signed)
   OCCUPATIONAL THERAPY TREATMENT NOTE   Patient Name:  [name]      Medical Record Number: [id number]  Date of Birth: [birth year] Location: [facility] [place]   PT Precautions: Falls,   OT Precautions: Falls, O2 dep 4L [place] SLP Precautions:  ,    Weight Bearing: RUE Weight Bearing: No Restrictions   LUE Weight Bearing: No Restrictions   RLE Weight Bearing: No Restrictions   LLE Weight Bearing: No Restrictions    SUBJECTIVE Pt participated well today in therapy.   OBJECTIVE Today's Treatment             Transfer from: Bed Transfer Type : To Transfer to : Stand Technique : Sit to stand Transfer Device : FWW Transfer Level of Assistance : Contact guard Trials/Comments : STS transfer from EOB required CGA with vc for hand placement and safety.     Toilet Transfer Technique: Freight Forwarder assistance: Scientist, Research (life Sciences) device used: Rolling walker Transfer to / from Toilet: Raised toilet seat with rails Toilet Transfers Comments: Functional mobility using FWW required Min A for steering and manuevering AD with 25% vcs for technique and safety.           Grooming: Contact guard (CGA standing at sink with FWW for support and balance to wash hands.) UB Dressing: Supervision, Minimal verbal cues (supervision for doffing/donning pullover garments with 25% vcs for technique with nasal canula mgmt and clothing orientation) LB Dressing: Minimum assistance (Min Assist required for clothing mgmt while standing with vc for intiation and technique.) Footwear management: Set up Toilet Hygiene: Minimum assistance Toilet: Clothing Management: Minimum assistance         Ther-Act    Deficits addressed: Fine Motor Strength/Coordination, General Activity Tolerance, Functional Reach  Treatment Level: Sitting - Supported Back        Therapeutic Tools Used/Treatment Activity : Pt engaged in therapeutic  activities to increase functional reaching, fine motor manipulation, bilateral integration, and crossing midline to improve with ADLs.      Pt educated on importance of using call light and having staff assist her to toilet to prevent skin breakdown and UTIs.  Pt educated on getting OOB over weekend, self propelling wc in hallways and sitting up for longer periods of time secondary to pt gravitating to bed.    I attest that I have reviewed the above information. Signed: Suzen Kerns, OTA 09/23/2024

## 2024-09-23 NOTE — Telephone Encounter (Signed)
 Called to confirm/remind patient of their appointment at the [facility] on 09/26/24 2:45.   Appointment:   [] Confirmed  [x] Left mess   [] No answer/No voice mail  [] VM Full/unable to leave message  [] Phone not in service  Patient reminded to bring all medications and/or complete list.  Confirmed patient has transportation. Gave directions, instructed to utilize valet parking.

## 2024-09-23 NOTE — Nursing Note (Signed)
 PT/INR DONE AND FAXED TO PHARMACY. RECEIVED ORDERS TO CONT SAME DOSE AND RECHECK PT/INR ON 09/28/2024

## 2024-09-23 NOTE — Nursing Note (Signed)
 Pt alert and able to voice needs. Feeds self. Adl's and incontinence care via staff. One person assist with transfers to bed and w/c. Cont on therapy caseload. Cont with o2 via n/c. SOB /cough at times. No edema noted. No c/o chest pain. No c/o dizziness/ lightheadedness. No discomfort voiced at this time.

## 2024-09-24 NOTE — LTC Provider Review (Signed)
   OCCUPATIONAL THERAPY TREATMENT NOTE   Patient Name:  [name]      Medical Record Number: [id number]  Date of Birth: 11-23-1950 Location: [facility] [place]   PT Precautions: Falls,   OT Precautions: Falls, O2 dep 4L [place] SLP Precautions:  ,    Weight Bearing: RUE Weight Bearing: No Restrictions   LUE Weight Bearing: No Restrictions   RLE Weight Bearing: No Restrictions   LLE Weight Bearing: No Restrictions    SUBJECTIVE Pt tolerated session well, no complaints of pain. Pt would benefit from further skilled OT services.    OBJECTIVE Today's Treatment   ADLs addressed this session with focus on environmental modifications, assessment of AE needed and pt education in energy conservation techniques, safety awareness, proper body mechanics, use of AE, to increase pt's (I) in self care tasks. Lb dressing Mod A. Grooming setup A. Transfers CGA.   BUE strengthening exercise completed with monitoring of pt's response, graded resistance accordingly and provided rest breaks when needed due to fatigue in order to increase pt's activity tolerance, (I) with ADLS and BUE strength needed for UE support during sit to stands, use of handrails and RW when engaging in ADL tasks.  I attest that I have reviewed the above information. Signed: Tessarah Dalleave, OTA 09/24/2024

## 2024-09-24 NOTE — Nursing Note (Signed)
 Patient is alert and oriented with the ability to make needs known, has no complaint of pain or sob. Night medications were tolerated with no complications. Patient is in bed resting with call light an dehydration within reach, is to continue therapy case load as ordered by MD.

## 2024-09-25 NOTE — Progress Notes (Incomplete)
 HEART & VASCULAR TRANSITION OF CARE CONSULT NOTE     Referring Physician: Dr. Ricky PCP: Renato Dorothey HERO, NP  Cardiology: Jayson Sierras, MD   Chief Complaint: Chronic HFpEF  HPI: Referred to clinic by Dr. Townsend heart failure consultation.   [name] is a 73 y.o. female with chronic HFpEF, chronic respiratory failure on 4L [place], chronic a fib, HTN, HLD and CAD s/p 2v CABG with mechanical AV replacement.   Admitted 11/25 with increased SOB. Compliant with home medications. Hypoxic, sating mid 80s on 6L [place]. CXR showed R pleural effusion and pulmonary edema. Diuresed well with IV lasix . *** Discharged to SNF.   Today she presents for AHF [facility] clinic visit. Overall feeling ***. Denies palpitations, CP, dizziness, edema, or PND/Orthopnea. *** SOB. Appetite ok. No fever or chills. Weight at home *** pounds. Taking all medications. Denies ETOH, tobacco or drug use.    Chronic HFpEF, iCM - Echo 7/25 EF 60-65%, no RWMA, mild MVH - NYHA *** - Volume ***. Continue Torsemide  40 mg daily - Continue farxiga  10 mg daily -  CAD - s/p 2v CABG 07' - Continue statin - no ASA with warfarin - denies CP  Chronic a fib  Chronic resp failure  CKD 3b  S/p AVR - warfarin  DNR/DNI  Cardiac Testing    Past Medical History:  Diagnosis Date   Anxiety disorder    Aortic stenosis    Status post St. Jude mechanical AVR 2007   Asthma    Atrial fibrillation (HCC)    Carcinoid tumor of colon (HCC) 2007   Chronic diastolic heart failure (HCC)    Coronary atherosclerosis of native coronary artery    Status post CABG 2007   GERD (gastroesophageal reflux disease)    History of colonoscopy 2003   Dr. 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 Shaaron, benign biopsies   RLS (restless legs syndrome)    Schatzki's ring     Current Outpatient Medications  Medication Sig Dispense Refill    albuterol  (VENTOLIN  HFA) 108 (90 Base) MCG/ACT inhaler Inhale 1-2 puffs into the lungs daily as needed for wheezing or shortness of breath.     ALPRAZolam  (XANAX ) 0.5 MG tablet Take 1 tablet (0.5 mg total) by mouth 2 (two) times daily. 10 tablet 0   CAPLYTA  42 MG capsule Take 42 mg by mouth at bedtime.     dapagliflozin  propanediol (FARXIGA ) 10 MG TABS tablet Take 1 tablet (10 mg total) by mouth daily.     diltiazem  (TIAZAC ) 120 MG 24 hr capsule Take 120 mg by mouth daily.     HYDROcodone -acetaminophen  (NORCO) 10-325 MG tablet Take 1 tablet by mouth every 6 (six) hours as needed for moderate pain (pain score 4-6). 25 tablet 0   levothyroxine  (SYNTHROID , LEVOTHROID) 50 MCG tablet Take 50 mcg by mouth daily before breakfast.      Multiple Vitamin (MULTIVITAMIN WITH MINERALS) TABS tablet Take 1 tablet by mouth daily.     OXYGEN  Inhale 4 L into the lungs daily.     potassium chloride  SA (KLOR-CON  M) 20 MEQ tablet Take 2 tablets (40 mEq total) by mouth daily. 180 tablet 2   rOPINIRole  (REQUIP ) 3 MG tablet Take 1 tablet (3 mg total) by mouth 2 (two) times daily. 10 tablet 0   rosuvastatin  (CRESTOR ) 20 MG tablet TAKE ONE (1) TABLET BY MOUTH  EVERY DAY 90 tablet 3   torsemide  40 MG TABS Take 40 mg by mouth daily.     TRELEGY ELLIPTA 100-62.5-25 MCG/ACT AEPB Inhale 1 puff into the lungs daily.     warfarin (COUMADIN ) 2 MG tablet Take 1.5-2 tablets (3-4 mg total) by mouth See admin instructions. 4mg  on Tuesday and 3 mg daily the rest of the week.     No current facility-administered medications for this visit.    Allergies  Allergen Reactions   Penicillin G Itching   Penicillins Hives, Itching, Swelling and Rash   Dust Mite Extract Other (See Comments)    Seasonal allergies      Social History   Socioeconomic History   Marital status: Married    Spouse name: Not on file   Number of children: 1   Years of education: stopped in 10th grade   Highest education level: GED or equivalent   Occupational History   Occupation: Retired  Tobacco Use   Smoking status: Former    Current packs/day: 0.00    Average packs/day: 1 pack/day for 20.0 years (20.0 ttl pk-yrs)    Types: Cigarettes    Start date: 10/20/1968    Quit date: 10/20/1988    Years since quitting: 35.9   Smokeless tobacco: Never  Vaping Use   Vaping status: Never Used  Substance and Sexual Activity   Alcohol use: No    Alcohol/week: 0.0 standard drinks of alcohol   Drug use: No   Sexual activity: Yes    Partners: Male    Birth control/protection: None    Comment: spouse  Other Topics Concern   Not on file  Social History Narrative   Not on file   Social Drivers of Health   Financial Resource Strain: Low Risk (12/27/2023)   Received from [facility]   Overall Financial Resource Strain (CARDIA)    Difficulty of Paying Living Expenses: Not hard at all  Food Insecurity: No Food Insecurity (09/08/2024)   Hunger Vital Sign    Worried About Running Out of Food in the Last Year: Never true    Ran Out of Food in the Last Year: Never true  Transportation Needs: No Transportation Needs (09/08/2024)   PRAPARE - Administrator, Civil Service (Medical): No    Lack of Transportation (Non-Medical): No  Physical Activity: Inactive (12/27/2023)   Received from [facility]   Exercise Vital Sign    On average, how many days per week do you engage in moderate to strenuous exercise (like a brisk walk)?: 0 days    On average, how many minutes do you engage in exercise at this level?: 0 min  Stress: Stress Concern Present (12/27/2023)   Received from [facility] of Occupational Health - Occupational Stress Questionnaire    Feeling of Stress : Rather much  Social Connections: Moderately Isolated (09/08/2024)   Social Connection and Isolation Panel    Frequency of Communication with Friends and Family: More than three times a week    Frequency of Social Gatherings with Friends and  Family: Three times a week    Attends Religious Services: Never    Active Member of Clubs or Organizations: No    Attends Banker Meetings: Never    Marital Status: Married  Catering Manager Violence: Not At Risk (09/08/2024)   Humiliation, Afraid, Rape, and Kick questionnaire    Fear of Current or Ex-Partner: No    Emotionally Abused: No  Physically Abused: No    Sexually Abused: No      Family History  Problem Relation Age of Onset   Stroke Mother    Cancer Father    Anesthesia problems Neg Hx    Hypotension Neg Hx    Malignant hyperthermia Neg Hx    Pseudochol deficiency Neg Hx     There were no vitals filed for this visit.  PHYSICAL EXAM: General:  *** appearing.  No respiratory difficulty Neck: JVD *** cm.  Cor: Regular rate & rhythm. No murmurs. Lungs: clear Extremities: no edema  Neuro: alert & oriented x 3. Affect pleasant.   ECG:   ASSESSMENT & PLAN:  NYHA *** GDMT  Diuretic- BB- Ace/ARB/ARNI MRA SGLT2i    Referred to HFSW (PCP, Medications, Transportation, ETOH Abuse, Drug Abuse, Insurance, Financial ): Yes or No Refer to Pharmacy: Yes or No Refer to Home Health: Yes on No Refer to Advanced [facility] Failure Clinic: Yes or no  Refer to General Cardiology: Yes or No  Follow up

## 2024-09-26 ENCOUNTER — Ambulatory Visit (HOSPITAL_COMMUNITY)

## 2024-09-27 NOTE — Progress Notes (Signed)
 HEART & VASCULAR TRANSITION OF CARE CONSULT NOTE   Referring Physician: Dr. Ricky PCP: Renato Dorothey HERO, NP  Cardiology: Jayson Sierras, MD  HPI: Referred to clinic by Dr. Ricky for heart failure consultation.   [name] is a 73 y.o. female with chronic HFpEF, chronic respiratory failure on 4L [place], chronic a fib, HTN, HLD and CAD s/p 2v CABG with mechanical AV replacement.   Echo 7/25 EF 60-65%, no RWMA, mild [facility]  Admitted 11/25 with increased SOB. Compliant with home medications. Hypoxic, sating mid 80s on 6L [place]. CXR showed R pleural effusion and pulmonary edema. Diuresed well with IV lasix . Discharge weight 136. Discharged to SNF.   Unfortunately she was readmitted to [facility] for decomensated heart failure. He weight was up ~40-50 lbs. She was diuresed and continued on current medical therapies. Discharged at weight 138 lb.   Today she presents for AHF [facility] clinic visit with her son. She is on home O2 for chronic hypoxia. Overall feeling pretty good. Plan is to discharge home from SNF today. Has been ambulating with PT and walker. No SOB at rest.  Appetite much improved. No fever or chills. Taking all medications. Drinking about 3 bottles of fluid per day.  Past Medical History:  Diagnosis Date   Anxiety disorder    Aortic stenosis    Status post St. Jude mechanical AVR 2007   Asthma    Atrial fibrillation (HCC)    Carcinoid tumor of colon (HCC) 2007   Chronic diastolic heart failure (HCC)    Coronary atherosclerosis of native coronary artery    Status post CABG 2007   GERD (gastroesophageal reflux disease)    History of colonoscopy 2003   Dr. 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 Shaaron, benign biopsies   RLS (restless legs syndrome)    Schatzki's ring     Current Outpatient Medications  Medication Sig Dispense Refill   albuterol  (VENTOLIN  HFA) 108 (90 Base) MCG/ACT inhaler  Inhale 1-2 puffs into the lungs daily as needed for wheezing or shortness of breath.     ALPRAZolam  (XANAX ) 0.5 MG tablet Take 1 tablet (0.5 mg total) by mouth 2 (two) times daily. 10 tablet 0   atorvastatin (LIPITOR) 40 MG tablet Take 40 mg by mouth daily.     CAPLYTA  42 MG capsule Take 42 mg by mouth at bedtime.     Dapagliflozin  Pro-metFORMIN ER 07-999 MG TB24 Take by mouth.     dapagliflozin  propanediol (FARXIGA ) 10 MG TABS tablet Take 1 tablet (10 mg total) by mouth daily.     diltiazem  (TIAZAC ) 120 MG 24 hr capsule Take 120 mg by mouth daily.     HYDROcodone -acetaminophen  (NORCO) 10-325 MG tablet Take 1 tablet by mouth every 6 (six) hours as needed for moderate pain (pain score 4-6). 25 tablet 0   levothyroxine  (SYNTHROID , LEVOTHROID) 50 MCG tablet Take 50 mcg by mouth daily before breakfast.      Multiple Vitamin (MULTIVITAMIN WITH MINERALS) TABS tablet Take 1 tablet by mouth daily.     OXYGEN  Inhale 4 L into the lungs daily.     potassium chloride  SA (KLOR-CON  M) 20 MEQ tablet Take 2 tablets (40 mEq total) by mouth daily. 180 tablet 2   rOPINIRole  (REQUIP ) 3 MG tablet Take 1 tablet (3 mg total) by mouth 2 (two) times daily. 10 tablet 0  rosuvastatin  (CRESTOR ) 20 MG tablet TAKE ONE (1) TABLET BY MOUTH EVERY DAY 90 tablet 3   torsemide  40 MG TABS Take 40 mg by mouth daily.     TRELEGY ELLIPTA 100-62.5-25 MCG/ACT AEPB Inhale 1 puff into the lungs daily.     warfarin (COUMADIN ) 2 MG tablet Take 1.5-2 tablets (3-4 mg total) by mouth See admin instructions. 4mg  on Tuesday and 3 mg daily the rest of the week.     No current facility-administered medications for this encounter.    Allergies  Allergen Reactions   Penicillin G Itching   Penicillins Hives, Itching, Swelling and Rash   Dust Mite Extract Other (See Comments)    Seasonal allergies    Social History   Socioeconomic History   Marital status: Married    Spouse name: Not on file   Number of children: 1   Years of education:  stopped in 10th grade   Highest education level: GED or equivalent  Occupational History   Occupation: Retired  Tobacco Use   Smoking status: Former    Current packs/day: 0.00    Average packs/day: 1 pack/day for 20.0 years (20.0 ttl pk-yrs)    Types: Cigarettes    Start date: 10/20/1968    Quit date: 10/20/1988    Years since quitting: 35.9   Smokeless tobacco: Never  Vaping Use   Vaping status: Never Used  Substance and Sexual Activity   Alcohol use: No    Alcohol/week: 0.0 standard drinks of alcohol   Drug use: No   Sexual activity: Yes    Partners: Male    Birth control/protection: None    Comment: spouse  Other Topics Concern   Not on file  Social History Narrative   Not on file   Social Drivers of Health   Tobacco Use: Medium Risk (10/03/2024)   Patient History    Smoking Tobacco Use: Former    Smokeless Tobacco Use: Never    Passive Exposure: Not on Actuary Strain: Low Risk (12/27/2023)   Received from [facility]   Overall Financial Resource Strain (CARDIA)    Difficulty of Paying Living Expenses: Not hard at all  Food Insecurity: No Food Insecurity (09/08/2024)   Epic    Worried About Radiation Protection Practitioner of Food in the Last Year: Never true    Ran Out of Food in the Last Year: Never true  Transportation Needs: No Transportation Needs (09/08/2024)   Epic    Lack of Transportation (Medical): No    Lack of Transportation (Non-Medical): No  Physical Activity: Inactive (12/27/2023)   Received from [facility]   Exercise Vital Sign    On average, how many days per week do you engage in moderate to strenuous exercise (like a brisk walk)?: 0 days    On average, how many minutes do you engage in exercise at this level?: 0 min  Stress: Stress Concern Present (12/27/2023)   Received from [facility] of Occupational Health - Occupational Stress Questionnaire    Feeling of Stress : Rather much  Social Connections: Moderately Isolated  (09/08/2024)   Social Connection and Isolation Panel    Frequency of Communication with Friends and Family: More than three times a week    Frequency of Social Gatherings with Friends and Family: Three times a week    Attends Religious Services: Never    Active Member of Clubs or Organizations: No    Attends Banker Meetings: Never  Marital Status: Married  Catering Manager Violence: Not At Risk (09/08/2024)   Epic    Fear of Current or Ex-Partner: No    Emotionally Abused: No    Physically Abused: No    Sexually Abused: No  Depression (PHQ2-9): Not on file  Alcohol Screen: Not on file  Housing: Patient Declined (09/19/2024)   Epic    Unable to Pay for Housing in the Last Year: Patient declined    Number of Times Moved in the Last Year: Not on file    Homeless in the Last Year: Patient declined  Utilities: Not At Risk (09/08/2024)   Epic    Threatened with loss of utilities: No  Health Literacy: Low Risk (09/26/2024)   Received from [facility] Literacy    How often do you need to have someone help you when you read instructions, pamphlets, or other written material from your doctor or pharmacy?: Never    Family History  Problem Relation Age of Onset   Stroke Mother    Cancer Father    Anesthesia problems Neg Hx    Hypotension Neg Hx    Malignant hyperthermia Neg Hx    Pseudochol deficiency Neg Hx    Blood pressure 102/67, pulse 82, height 5' 7 (1.702 m), weight 63.5 kg (140 lb), SpO2 99%.  Filed Weights   10/03/24 0854  Weight: 63.5 kg (140 lb)   PHYSICAL EXAM: General:  Elderly appearing.  No respiratory difficulty on O2 Neck: JVD ~8 cm.  Cor: Irregular HR. Mech valve click. NO murmur Extremities: no edema, venous stasis dermatitis Neuro: alert & oriented x 3. Affect pleasant. Arrived by [facility]  ECG: None today  ASSESSMENT & PLAN: Chronic HFpEF, iCM - Echo 7/25 EF 60-65%, no RWMA, mild MVH - NYHA IV, compounded by pulmonary disease and  deconditioning - Volume mildly up. Continue Torsemide  40 mg daily. Take additional 40 mg PRN for weight gain (discussed at length with her and son, she will start taking weight daily). BMET today - GDMT  ? blocker: contraindicated with pulm disease  ARB/ARNi: not indicated.  MRA: start spirolactone 25 mg daily; stop potassium. Repeat BMET at [facility] follow up.  SGLT2i: continue farxiga  10 mg daily - Tight volume control will be important to prevent further hospitalizations  CAD - s/p 2v CABG 07' - Continue statin - no ASA with warfarin - denies CP  Chronic Afib - on warfarin - rate controlled - on diltiazem  120 mg dialy  Chronic resp failure - longstanding history of COPD  CKD 3b - Baseline Cr 1.4-1.6 - BMET today  S/p AVR - stable on echo 7/25 - on warfarin  Iron deficiency anemia - anemia of chronic dz - check iron panel - if tsat <20 and ferritin <200; recommend referral for IV Fe to assist with symptoms  DNR/DNI  Recommend home PT if not already followed.  Referred to HFSW (PCP, Medications, Transportation, ETOH Abuse, Drug Abuse, Insurance, Financial ): No Refer to Pharmacy: No Refer to Home Health: Recommend home PT.  Refer to Advanced Heart Failure Clinic: No   Established with [facility]. Has follow up in [facility] 10/12/24.  Nasra Counce, NP 10/03/2024

## 2024-09-30 ENCOUNTER — Telehealth (HOSPITAL_COMMUNITY): Payer: Self-pay

## 2024-09-30 NOTE — Telephone Encounter (Signed)
 Called to confirm/remind patient of their appointment at the [facility] on 10/03/24 9:30.   Appointment:   [] Confirmed  [x] Left mess   [] No answer/No voice mail  [] VM Full/unable to leave message  [] Phone not in service  Patient reminded to bring all medications and/or complete list.  Confirmed patient has transportation. Gave directions, instructed to utilize valet parking.

## 2024-10-03 ENCOUNTER — Encounter (HOSPITAL_COMMUNITY): Payer: Self-pay

## 2024-10-03 ENCOUNTER — Ambulatory Visit (HOSPITAL_COMMUNITY): Admission: RE | Admit: 2024-10-03 | Discharge: 2024-10-03 | Attending: Cardiology

## 2024-10-03 VITALS — BP 102/67 | HR 82 | Ht 67.0 in | Wt 140.0 lb

## 2024-10-03 DIAGNOSIS — I482 Chronic atrial fibrillation, unspecified: Secondary | ICD-10-CM

## 2024-10-03 DIAGNOSIS — I2510 Atherosclerotic heart disease of native coronary artery without angina pectoris: Secondary | ICD-10-CM

## 2024-10-03 DIAGNOSIS — D638 Anemia in other chronic diseases classified elsewhere: Secondary | ICD-10-CM

## 2024-10-03 DIAGNOSIS — I5030 Unspecified diastolic (congestive) heart failure: Secondary | ICD-10-CM

## 2024-10-03 DIAGNOSIS — I255 Ischemic cardiomyopathy: Secondary | ICD-10-CM | POA: Diagnosis not present

## 2024-10-03 DIAGNOSIS — Z79899 Other long term (current) drug therapy: Secondary | ICD-10-CM | POA: Diagnosis not present

## 2024-10-03 DIAGNOSIS — J9611 Chronic respiratory failure with hypoxia: Secondary | ICD-10-CM | POA: Diagnosis not present

## 2024-10-03 DIAGNOSIS — Z952 Presence of prosthetic heart valve: Secondary | ICD-10-CM

## 2024-10-03 DIAGNOSIS — Z9981 Dependence on supplemental oxygen: Secondary | ICD-10-CM | POA: Diagnosis not present

## 2024-10-03 DIAGNOSIS — D631 Anemia in chronic kidney disease: Secondary | ICD-10-CM | POA: Diagnosis not present

## 2024-10-03 DIAGNOSIS — N1832 Chronic kidney disease, stage 3b: Secondary | ICD-10-CM

## 2024-10-03 DIAGNOSIS — Z7901 Long term (current) use of anticoagulants: Secondary | ICD-10-CM | POA: Diagnosis not present

## 2024-10-03 DIAGNOSIS — Z66 Do not resuscitate: Secondary | ICD-10-CM | POA: Diagnosis not present

## 2024-10-03 DIAGNOSIS — J449 Chronic obstructive pulmonary disease, unspecified: Secondary | ICD-10-CM | POA: Diagnosis not present

## 2024-10-03 DIAGNOSIS — E785 Hyperlipidemia, unspecified: Secondary | ICD-10-CM | POA: Diagnosis not present

## 2024-10-03 DIAGNOSIS — Z954 Presence of other heart-valve replacement: Secondary | ICD-10-CM | POA: Diagnosis not present

## 2024-10-03 DIAGNOSIS — D509 Iron deficiency anemia, unspecified: Secondary | ICD-10-CM | POA: Diagnosis not present

## 2024-10-03 DIAGNOSIS — Z951 Presence of aortocoronary bypass graft: Secondary | ICD-10-CM | POA: Diagnosis not present

## 2024-10-03 DIAGNOSIS — J9610 Chronic respiratory failure, unspecified whether with hypoxia or hypercapnia: Secondary | ICD-10-CM | POA: Diagnosis not present

## 2024-10-03 DIAGNOSIS — I5032 Chronic diastolic (congestive) heart failure: Secondary | ICD-10-CM | POA: Diagnosis present

## 2024-10-03 DIAGNOSIS — Z87891 Personal history of nicotine dependence: Secondary | ICD-10-CM | POA: Diagnosis not present

## 2024-10-03 MED ORDER — SPIRONOLACTONE 25 MG PO TABS: 25.0000 mg | ORAL_TABLET | Freq: Every day | ORAL | 3 refills | Status: AC

## 2024-10-03 MED ORDER — TORSEMIDE 40 MG PO TABS: 40.0000 mg | ORAL_TABLET | Freq: Every day | ORAL | 3 refills | Status: AC

## 2024-10-03 NOTE — Patient Instructions (Addendum)
 Stop potassium. Start Spiro 25 mg daily - Rx sent to pharmacy below. Torsemide  40 mg daily; but you may take additional 40 mg in afternoon for weight gain > 3 lbs overnight or > 5 lbs in one week. Labs today - we will call you if abnormal. Repeat labs at your follow up appointment with [facility] on 12/24. Note added to that appointment. You may call us  at [phone] if any questions.  Provided two copies of AFS for patient and facility. No paperwork received from facility due to planned discharge today? Please note medication changes.

## 2024-10-12 ENCOUNTER — Ambulatory Visit: Attending: Nurse Practitioner | Admitting: Nurse Practitioner

## 2024-10-12 ENCOUNTER — Encounter: Payer: Self-pay | Admitting: Nurse Practitioner

## 2024-10-12 VITALS — BP 106/58 | HR 80 | Ht 67.0 in | Wt 158.0 lb

## 2024-10-12 DIAGNOSIS — Z8679 Personal history of other diseases of the circulatory system: Secondary | ICD-10-CM | POA: Diagnosis not present

## 2024-10-12 DIAGNOSIS — R931 Abnormal findings on diagnostic imaging of heart and coronary circulation: Secondary | ICD-10-CM | POA: Diagnosis not present

## 2024-10-12 DIAGNOSIS — N1832 Chronic kidney disease, stage 3b: Secondary | ICD-10-CM | POA: Diagnosis not present

## 2024-10-12 DIAGNOSIS — J449 Chronic obstructive pulmonary disease, unspecified: Secondary | ICD-10-CM

## 2024-10-12 DIAGNOSIS — Z952 Presence of prosthetic heart valve: Secondary | ICD-10-CM | POA: Diagnosis not present

## 2024-10-12 DIAGNOSIS — I5030 Unspecified diastolic (congestive) heart failure: Secondary | ICD-10-CM

## 2024-10-12 DIAGNOSIS — I38 Endocarditis, valve unspecified: Secondary | ICD-10-CM

## 2024-10-12 DIAGNOSIS — I272 Pulmonary hypertension, unspecified: Secondary | ICD-10-CM | POA: Diagnosis not present

## 2024-10-12 DIAGNOSIS — I482 Chronic atrial fibrillation, unspecified: Secondary | ICD-10-CM | POA: Diagnosis not present

## 2024-10-12 DIAGNOSIS — Z9889 Other specified postprocedural states: Secondary | ICD-10-CM

## 2024-10-12 DIAGNOSIS — I2510 Atherosclerotic heart disease of native coronary artery without angina pectoris: Secondary | ICD-10-CM

## 2024-10-12 DIAGNOSIS — L309 Dermatitis, unspecified: Secondary | ICD-10-CM

## 2024-10-12 NOTE — Progress Notes (Signed)
 " Cardiology Office Note:  .   Date: 10/12/2024 ID:  [name], DOB 07/21/1951, MRN [id number] PCP: Amy Dorothey HERO, NP  [facility] Providers Cardiologist:  Amy Sierras, MD    History of Present Illness: .   [name] is a 73 y.o. female with a PMH of aortic valve stenosis, s/p mechanical AVR in 2007 with 38 mm Hemashield tube graft repair of ascending aortic aneurysm, HFpEF, CAD, s/p CABG in 2007, permanent A-fib, possible left atrial mass versus artifact, CKD stage IIIb, and COPD, who presents today for hospital follow-up.  Last seen by Dr. Sierras on October 08, 2023.  She had been recently discharged from hospital with acute on chronic hypoxic/hypercarbic respiratory failure, felt to be multifactorial in setting of HFpEF and COPD exacerbation.  See echocardiogram report noted below.  Was currently undergoing rehab at [facility] nursing center.  Still noting weakness, denied any progressive leg edema.  Weight was down.  Was felt she would not be a good candidate for TEE given chronic respiratory status.  Recommend to possibly consider outpatient cardiac CT once patient was out of rehab and was feeling better at home.  Hospital admission in March 2025 for acute on chronic respiratory failure with hypoxia and hypercapnia, started on IV Lasix .  Workup revealed chest x-ray with small bilateral pleural effusions, associated bibasilar dependent compressive atelectasis, superimposed pneumonia not entirely excluded.  Noted to have cardiomegaly with moderate bilateral interstitial and alveolar pulmonary edema, likely CHF exacerbation.  proBNP 17,489.  Was on BiPAP during hospital stay.  Was referred to [facility] rehab for SNF placement.  Did well at SNF receiving OT/PT, PT and OT was arranged outpatient.  02/26/2024 - Today she presents for hospital follow-up.  She states she is doing much better since leaving the hospital. Now she is off bisoprolol  and now on metoprolol , tolerating  well. She is on 3.5 L of continuous oxygen . Tells me she has broken 4 ribs on left side, however I do not see recent imaging results to confirm this. Denies any chest pain, shortness of breath, palpitations, syncope, presyncope, dizziness, orthopnea, PND, swelling or significant weight changes, acute bleeding, or claudication. Had left knee operated on last fall.   Since I have last seen patient, she was hospitalized July 2025 and presented with dyspnea, deconditioning, and was admitted d/t acute on chronic respiratory failure with hypoxia and hypercapnia that was secondary to COPD exacerbation along with acute on chronic diastolic HF. Was d/c to SNF.   05/27/2024 - Today she presents for follow-up. She is here with her son. Son says she just got out of rehab today and [facility] SNF. Tells me she has been having low BP, overall asymptomatic. Overall doing well. Denies any chest pain, shortness of breath, palpitations, syncope, presyncope, dizziness, orthopnea, PND, swelling or significant weight changes, acute bleeding, or claudication. Son says she is about to start OP PT.   07/04/2024 - She is overall doing well. She is now on new inhaler, has a cough from this but says overall she is sounding better. Denies any acute cardiac complaints or issues. Denies any chest pain, shortness of breath, palpitations, syncope, presyncope, dizziness, orthopnea, PND, swelling or significant weight changes, acute bleeding, or claudication.  08/22/2024 - Here for follow-up. Doing great. Weight is only up 5 lbs compared to last office visit. Denies any chest pain, shortness of breath, palpitations, syncope, presyncope, dizziness, orthopnea, PND, swelling or significant weight changes, acute bleeding, or claudication.  Hospitalized from September 08, 2024 to September 20, 2024 due to acute on chronic diastolic CHF.  She was hypoxic on presentation, required up to 6 L of oxygen .  Chest x-ray revealed cardiomegaly, chronic right pleural  effusion and pulmonary edema, proBNP 5004, started on IV diuresis.  Cardiology was consulted. Appears she went to rehab after d/c.    Saw TOC HF clinic on 10/03/2024. D/C wt was noted to be 138 lbs. Was overall doing well. Compliant with medications, was about to be d/c from SNF.   Here for hospital follow-up.  She states she is doing well. Weight this AM according to son's report was 153 lbs. Denies any chest pain, worsening shortness of breath, palpitations, syncope, presyncope, dizziness, orthopnea, PND, swelling or significant weight changes, acute bleeding, or claudication.   ROS: Negative. See HPI.   Studies Reviewed: SABRA    EKG: EKG is not ordered today.   Echo 04/2024:  1. Left ventricular ejection fraction, by estimation, is 60 to 65%. The  left ventricle has normal function. The left ventricle has no regional  wall motion abnormalities. There is mild left ventricular hypertrophy.  Left ventricular diastolic parameters  are indeterminate. There is the interventricular septum is flattened in  systole and diastole, consistent with right ventricular pressure and  volume overload.   2. Right ventricular systolic function was not well visualized. The right  ventricular size is not well visualized. There is moderately elevated  pulmonary artery systolic pressure.   3. 3.4 x 2.4 cm round density adjacent to the interatrial septum within  the left atrium, limited visualization. Consider TEE or cardiac CT for  further evaluation. . Left atrial size was severely dilated.   4. Right atrial size was severely dilated.   5. The mitral valve is abnormal. Mild mitral valve regurgitation. No  evidence of mitral stenosis. Moderate mitral annular calcification.   6. The tricuspid valve is abnormal. Tricuspid valve regurgitation is  moderate to severe.   7. There is a 25 mm St. Jude mechanical valve present in the aortic  position.     . The aortic valve has been repaired/replaced. Aortic valve   regurgitation is not visualized. No aortic stenosis is present. There is a  25 mm St. Jude mechanical valve present in the aortic position. Procedure  Date: 2007. Aortic valve mean gradient  measures 5.0 mmHg.   8. The inferior vena cava is dilated in size with >50% respiratory  variability, suggesting right atrial pressure of 8 mmHg.   Comparison(s): A prior study was performed on 09/10/2023. EF 65-70%.  Interventricular septum is flattened in systole and diastole, consistent  with right ventricular pressure and volume overload. Right ventricular  size is mildly enlarged and systolic  function mildly reduced. Large rounded echodensity within region of the  left atrium. Similar finding on previous echo. Severe bi-atrial  enlargement. Moderate mitral valve regurgitation. 25 mm mechancial St.  Jude aortic valve noted-men pg 5.0 mmHg.   Echo 08/2023:  1. Left ventricular ejection fraction, by estimation, is 65 to 70%. The  left ventricle has normal function. The left ventricle has no regional  wall motion abnormalities. There is mild left ventricular hypertrophy.  Left ventricular diastolic parameters  are indeterminate. There is the interventricular septum is flattened in  systole and diastole, consistent with right ventricular pressure and  volume overload.   2. Right ventricular systolic function is mildly reduced. The right  ventricular size is mildly enlarged. There is mildly elevated pulmonary  artery systolic pressure. The  estimated right ventricular systolic  pressure is 43.5 mmHg.   3. Left atrial size was severely dilated.   4. Large, rounded echodensity (approximately 3.5 x 3.5 cm) noted adjacent  to atrial septum within region of left atrium - best seen in subcostal  view and to less degree in apical two chamber view. Similar finding noted  on prior study although somewhat  obscured by acoustic shadowing from AVR. Concern would be to exclude  atrial myxoma although could  be off axis imaging of atrial wall. Mass not  described on standard chest CT. Consider TEE or cardiac CTA for further  evaluation if clinically stable.   5. Right atrial size was severely dilated.   6. The mitral valve is degenerative. Moderate mitral valve regurgitation.  Moderate mitral annular calcification.   7. Tricuspid valve regurgitation is moderate.   8. The aortic valve has been repaired/replaced. Aortic valve  regurgitation is not visualized. There is a 25 mm St. Jude mechanical  valve present in the aortic position. Aortic valve mean gradient measures  5.0 mmHg.   9. The inferior vena cava is dilated in size with <50% respiratory  variability, suggesting right atrial pressure of 15 mmHg.   Comparison(s): Prior images reviewed side by side.   Physical Exam:   VS:  BP (!) 106/58 (BP Location: Left Arm, Cuff Size: Normal)   Pulse 80   Ht 5' 7 (1.702 m)   Wt 158 lb (71.7 kg)   SpO2 92% Comment: on oxygen  4 liters via [place]  BMI 24.75 kg/m    Wt Readings from Last 3 Encounters:  10/12/24 158 lb (71.7 kg)  10/03/24 140 lb (63.5 kg)  09/20/24 142 lb 13.7 oz (64.8 kg)    GEN: Thin, 73 y.o. female in no acute distress NECK: No JVD; No carotid bruits CARDIAC: S1/S2, RRR, no murmurs, rubs, gallops RESPIRATORY:  Clear to auscultation without rales, wheezing or rhonchi  ABDOMEN: Soft, non-tender, non-distended EXTREMITIES:  No edema; No deformity, does have Band-Aids along BLE, admits to seeing a dermatologist in the past  ASSESSMENT AND PLAN: .     HFpEF Stage C, NYHA class I-III symptoms. Weight is up from last office visit but appears euvolemic and well compensated on exam  - due for labs as she is now on Aldactone .  Will obtain BMET, proBNP, and Mag ASAP. EF 60-65% in July 2025.  See most recent Echo report noted above. Euvolemic well compensated on exam. GDMT limited at this time - hx of hypotension. No medication changes at this time.  Low sodium diet, fluid restriction  <2L, and daily weights encouraged. Educated to contact our office for weight gain of 2 lbs overnight or 5 lbs in one week.   CAD, s/p CABG Stable with no anginal symptoms. No indication for ischemic evaluation.  Not on ASA d/t being on Coumadin .  Continue current medication regimen. Heart healthy diet and regular cardiovascular exercise encouraged.   Aortic valve stenosis, s/p mechanical AVR in 2007 with 38 mm Hemashield tube graft repair of ascending aortic aneurysm Normal prosthetic valve function seen on most recent echo. Will continue to monitor with regular surveillance.   Permanent A-fib Denies any tachycardia or palpitations. No longer on BB as she was having hypotension. Continue current medication regimen.  Continue follow-up with Coumadin  clinic.  PCP is managing INR checks.   Abnormal Echo, valvular insufficiency Most recent echo 04/2024 revealed a 3.4 x 2.4 cm round density adjacent to the interatrial septum within the left  atrium, limited visualization. TR moderate to severe. Not a good surgical candidate. Mild MR. Reviewed by Dr. Shlomo during most recent hospitalization and recommended that pt would not be an ideal candidate for TEE given her respiratory status. Stated could consider coronary CTA given her renal function and respiratory status, but was not a candidate at the time during hospitalization due to her kidney function. Have discussed and reviewed with pt, and came to shared medical decision that we will continue to monitor at this time.   CKD stage 3b Most recent kidney function on file showed sCr of 1.84 with eGFR 29. Avoid nephrotoxic agents. No med changes at this time. Continue to follow with PCP. Will obtain labs as noted above.   7. COPD, pulmonary HTN Breathing is stable per her report. Echo 04/2024 revealed moderately elevated PASP. WHO group 2/3. Continue to f/u with PCP.   8. Dermatitis Will see if HH wound care can be arranged as pt has multiple bandaids along  BLE. Continue to follow with PCP.  Dispo: Follow-up with MD/APP in 4-6 weeks or sooner if anything changes.   Signed, Amy Crate, NP   "

## 2024-10-12 NOTE — Patient Instructions (Signed)
 Heart Failure Action Plan A heart failure action plan helps you know what to do when you have symptoms of heart failure. Your action plan is a color-coded plan that lists the symptoms to watch for and indicates what actions to take. If you have symptoms in the green zone, you're doing well. If you have symptoms in the yellow zone, you're having problems. If you have symptoms in the red zone, you need medical care right away. Follow the plan that was created by you and your health care provider. Review your plan each time you visit your provider. Green zone These signs mean you're doing well and can continue what you're doing: You don't have new or worsening shortness of breath. You have very little swelling or no new swelling. Your weight is stable (no gain or loss). You have a normal activity level. You don't have chest pain or any other new symptoms. Yellow zone These signs and symptoms mean your condition may be getting worse and you should make some changes: You have trouble breathing when you're active. You have swelling in your feet or legs or have discomfort in your belly. You gain 2-3 lb (0.9-1.4 kg) in 24 hours, or 5 lb (2.3 kg) in a week. This amount may be more or less depending on your condition. You get tired easily. You have trouble sleeping. You have a dry cough. If you have any of these symptoms: Contact your provider within the next day. Your provider may adjust your medicines. Red zone These signs and symptoms mean you should get medical help right away: You have trouble breathing when resting or cannot lie flat and you need to raise your head to help you breathe. You have a dry cough that's getting worse. You have swelling or pain in your feet or legs or discomfort in your belly that's getting worse. You suddenly gain more than 2-3 lb (0.9-1.4 kg) in 24 hours, or more than 5 lb (2.3 kg) in a week. This amount may be more or less depending on your condition. You have  trouble staying awake or you feel confused. You don't have an appetite. You have worsening sadness or depression. These symptoms may be an emergency. Call 911 right away. Do not wait to see if the symptoms will go away. Do not drive yourself to the hospital. Follow these instructions at home: Take medicines only as told. Eat a heart-healthy diet. Work with a dietitian to create an eating plan that's best for you. Weigh yourself each day.  Where to find more information American Heart Association: heart.org This information is not intended to replace advice given to you by your health care provider. Make sure you discuss any questions you have with your health care provider. Document Revised: 05/21/2023 Document Reviewed: 05/21/2023 Elsevier Patient Education  2024 Elsevier Inc.       Medication Instructions:   Your physician recommends that you continue on your current medications as directed. Please refer to the Current Medication list given to you today.   Labwork: CMET,Magnesium , BNP at [facility]  Testing/Procedures: None today  Follow-Up: 4 weeks  Any Other Special Instructions Will Be Listed Below (If Applicable).  If you need a refill on your cardiac medications before your next appointment, please call your pharmacy.

## 2024-10-17 NOTE — Addendum Note (Signed)
 Addended by: KENETH ROSINA BROCKS on: 10/17/2024 09:15 AM   Modules accepted: Orders

## 2024-10-18 ENCOUNTER — Ambulatory Visit: Attending: Cardiology | Admitting: *Deleted

## 2024-10-18 DIAGNOSIS — Z5181 Encounter for therapeutic drug level monitoring: Secondary | ICD-10-CM | POA: Diagnosis not present

## 2024-10-18 DIAGNOSIS — Z952 Presence of prosthetic heart valve: Secondary | ICD-10-CM

## 2024-10-18 DIAGNOSIS — I482 Chronic atrial fibrillation, unspecified: Secondary | ICD-10-CM

## 2024-10-18 LAB — POCT INR: INR: 4.2 — AB (ref 2.0–3.0)

## 2024-10-18 NOTE — Patient Instructions (Signed)
 Took warfarin this morning Hold warfarin tomorrow then decrease dose to 1 1/2 tablets daily except 1 tablet on Mondays and Thursdays  Recheck in 2 wk

## 2024-10-18 NOTE — Progress Notes (Signed)
 INR 4.2. Please see anticoagulation encounter

## 2024-11-01 ENCOUNTER — Telehealth: Payer: Self-pay | Admitting: Cardiology

## 2024-11-01 ENCOUNTER — Ambulatory Visit

## 2024-11-01 NOTE — Telephone Encounter (Signed)
 Pts son returning call about rescheduling appt, states will call back when getting off work. Please advise.

## 2024-11-07 ENCOUNTER — Ambulatory Visit: Attending: Cardiology | Admitting: *Deleted

## 2024-11-07 DIAGNOSIS — Z5181 Encounter for therapeutic drug level monitoring: Secondary | ICD-10-CM | POA: Diagnosis not present

## 2024-11-07 DIAGNOSIS — Z952 Presence of prosthetic heart valve: Secondary | ICD-10-CM | POA: Diagnosis not present

## 2024-11-07 DIAGNOSIS — I482 Chronic atrial fibrillation, unspecified: Secondary | ICD-10-CM

## 2024-11-07 LAB — POCT INR: INR: 2.1 (ref 2.0–3.0)

## 2024-11-07 NOTE — Progress Notes (Signed)
"  INR 2.1  "

## 2024-11-07 NOTE — Patient Instructions (Signed)
 Took warfarin this morning Continue warfarin 1 1/2 tablets daily except 1 tablet on Mondays and Thursdays  Recheck in 4 wk

## 2024-11-21 ENCOUNTER — Ambulatory Visit: Admitting: Nurse Practitioner

## 2024-11-30 ENCOUNTER — Ambulatory Visit: Admitting: Cardiology

## 2024-12-05 ENCOUNTER — Ambulatory Visit
